# Patient Record
Sex: Male | Born: 1955 | Race: White | Hispanic: No | Marital: Married | Smoking: Current every day smoker
Health system: Southern US, Community
[De-identification: ages and names within clinical notes are randomized; demographics above are authoritative.]

## PROBLEM LIST (undated history)

## (undated) ENCOUNTER — Inpatient Hospital Stay: Admission: EM | Discharge status: Absent without leave | Payer: Self-pay | Source: Home / Self Care

## (undated) DIAGNOSIS — E785 Hyperlipidemia, unspecified: Secondary | ICD-10-CM

## (undated) DIAGNOSIS — I671 Cerebral aneurysm, nonruptured: Secondary | ICD-10-CM

## (undated) DIAGNOSIS — I509 Heart failure, unspecified: Secondary | ICD-10-CM

## (undated) DIAGNOSIS — M549 Dorsalgia, unspecified: Secondary | ICD-10-CM

## (undated) DIAGNOSIS — T4145XA Adverse effect of unspecified anesthetic, initial encounter: Secondary | ICD-10-CM

## (undated) DIAGNOSIS — K635 Polyp of colon: Secondary | ICD-10-CM

## (undated) DIAGNOSIS — K219 Gastro-esophageal reflux disease without esophagitis: Secondary | ICD-10-CM

## (undated) DIAGNOSIS — L570 Actinic keratosis: Secondary | ICD-10-CM

## (undated) DIAGNOSIS — I4892 Unspecified atrial flutter: Secondary | ICD-10-CM

## (undated) DIAGNOSIS — Z8619 Personal history of other infectious and parasitic diseases: Secondary | ICD-10-CM

## (undated) DIAGNOSIS — J45909 Unspecified asthma, uncomplicated: Secondary | ICD-10-CM

## (undated) DIAGNOSIS — J449 Chronic obstructive pulmonary disease, unspecified: Secondary | ICD-10-CM

## (undated) DIAGNOSIS — I10 Essential (primary) hypertension: Secondary | ICD-10-CM

## (undated) DIAGNOSIS — Z720 Tobacco use: Secondary | ICD-10-CM

## (undated) DIAGNOSIS — T50901A Poisoning by unspecified drugs, medicaments and biological substances, accidental (unintentional), initial encounter: Secondary | ICD-10-CM

## (undated) DIAGNOSIS — R413 Other amnesia: Secondary | ICD-10-CM

## (undated) DIAGNOSIS — J439 Emphysema, unspecified: Secondary | ICD-10-CM

## (undated) DIAGNOSIS — M1990 Unspecified osteoarthritis, unspecified site: Secondary | ICD-10-CM

## (undated) DIAGNOSIS — G629 Polyneuropathy, unspecified: Secondary | ICD-10-CM

## (undated) DIAGNOSIS — G8929 Other chronic pain: Secondary | ICD-10-CM

## (undated) DIAGNOSIS — G4733 Obstructive sleep apnea (adult) (pediatric): Secondary | ICD-10-CM

## (undated) DIAGNOSIS — I4891 Unspecified atrial fibrillation: Secondary | ICD-10-CM

## (undated) DIAGNOSIS — E669 Obesity, unspecified: Secondary | ICD-10-CM

## (undated) HISTORY — PX: CARPAL TUNNEL RELEASE: SHX101

## (undated) HISTORY — DX: Cerebral aneurysm, nonruptured: I67.1

## (undated) HISTORY — DX: Chronic obstructive pulmonary disease, unspecified: J44.9

## (undated) HISTORY — PX: KNEE SURGERY: SHX244

## (undated) HISTORY — DX: Tobacco use: Z72.0

## (undated) HISTORY — PX: OTHER SURGICAL HISTORY: SHX169

## (undated) HISTORY — PX: THROAT SURGERY: SHX803

## (undated) HISTORY — PX: NECK SURGERY: SHX720

## (undated) HISTORY — DX: Obstructive sleep apnea (adult) (pediatric): G47.33

## (undated) HISTORY — DX: Unspecified atrial flutter: I48.92

## (undated) HISTORY — PX: HERNIA REPAIR: SHX51

## (undated) HISTORY — PX: APPENDECTOMY: SHX54

## (undated) HISTORY — DX: Actinic keratosis: L57.0

## (undated) HISTORY — DX: Essential (primary) hypertension: I10

## (undated) HISTORY — PX: TONSILLECTOMY: SUR1361

## (undated) HISTORY — DX: Hyperlipidemia, unspecified: E78.5

## (undated) HISTORY — DX: Poisoning by unspecified drugs, medicaments and biological substances, accidental (unintentional), initial encounter: T50.901A

## (undated) HISTORY — DX: Obesity, unspecified: E66.9

---

## 1998-03-23 ENCOUNTER — Ambulatory Visit (HOSPITAL_COMMUNITY): Admission: RE | Admit: 1998-03-23 | Discharge: 1998-03-23 | Payer: Self-pay | Admitting: Neurosurgery

## 1998-04-22 ENCOUNTER — Encounter: Admission: RE | Admit: 1998-04-22 | Discharge: 1998-07-21 | Payer: Self-pay | Admitting: Neurosurgery

## 1998-06-29 ENCOUNTER — Ambulatory Visit (HOSPITAL_COMMUNITY): Admission: RE | Admit: 1998-06-29 | Discharge: 1998-06-29 | Payer: Self-pay | Admitting: Neurosurgery

## 2000-10-12 ENCOUNTER — Emergency Department (HOSPITAL_COMMUNITY): Admission: EM | Admit: 2000-10-12 | Discharge: 2000-10-12 | Payer: Self-pay | Admitting: Emergency Medicine

## 2000-10-12 ENCOUNTER — Encounter: Payer: Self-pay | Admitting: Emergency Medicine

## 2000-10-17 ENCOUNTER — Emergency Department (HOSPITAL_COMMUNITY): Admission: EM | Admit: 2000-10-17 | Discharge: 2000-10-17 | Payer: Self-pay | Admitting: Emergency Medicine

## 2000-12-27 ENCOUNTER — Encounter: Admission: RE | Admit: 2000-12-27 | Discharge: 2000-12-27 | Payer: Self-pay | Admitting: Neurosurgery

## 2001-04-03 ENCOUNTER — Encounter: Admission: RE | Admit: 2001-04-03 | Discharge: 2001-04-03 | Payer: Self-pay | Admitting: Neurosurgery

## 2001-11-09 ENCOUNTER — Encounter: Admission: RE | Admit: 2001-11-09 | Discharge: 2001-11-09 | Payer: Self-pay | Admitting: Internal Medicine

## 2001-11-09 ENCOUNTER — Encounter: Payer: Self-pay | Admitting: Internal Medicine

## 2001-11-10 ENCOUNTER — Encounter: Admission: RE | Admit: 2001-11-10 | Discharge: 2001-11-10 | Payer: Self-pay | Admitting: Internal Medicine

## 2001-11-10 ENCOUNTER — Encounter: Payer: Self-pay | Admitting: Internal Medicine

## 2004-02-06 ENCOUNTER — Encounter: Admission: RE | Admit: 2004-02-06 | Discharge: 2004-02-06 | Payer: Self-pay | Admitting: Neurosurgery

## 2004-08-26 ENCOUNTER — Encounter: Admission: RE | Admit: 2004-08-26 | Discharge: 2004-08-26 | Payer: Self-pay | Admitting: Specialist

## 2004-11-05 ENCOUNTER — Encounter (INDEPENDENT_AMBULATORY_CARE_PROVIDER_SITE_OTHER): Payer: Self-pay | Admitting: *Deleted

## 2004-11-05 ENCOUNTER — Ambulatory Visit (HOSPITAL_BASED_OUTPATIENT_CLINIC_OR_DEPARTMENT_OTHER): Admission: RE | Admit: 2004-11-05 | Discharge: 2004-11-05 | Payer: Self-pay | Admitting: Otolaryngology

## 2004-11-05 ENCOUNTER — Ambulatory Visit (HOSPITAL_COMMUNITY): Admission: RE | Admit: 2004-11-05 | Discharge: 2004-11-05 | Payer: Self-pay | Admitting: Otolaryngology

## 2004-12-17 ENCOUNTER — Emergency Department (HOSPITAL_COMMUNITY): Admission: EM | Admit: 2004-12-17 | Discharge: 2004-12-17 | Payer: Self-pay | Admitting: Emergency Medicine

## 2004-12-30 ENCOUNTER — Emergency Department (HOSPITAL_COMMUNITY): Admission: EM | Admit: 2004-12-30 | Discharge: 2004-12-30 | Payer: Self-pay | Admitting: Emergency Medicine

## 2006-05-16 HISTORY — PX: CARDIAC CATHETERIZATION: SHX172

## 2007-03-22 ENCOUNTER — Inpatient Hospital Stay (HOSPITAL_COMMUNITY): Admission: AD | Admit: 2007-03-22 | Discharge: 2007-03-23 | Payer: Self-pay | Admitting: Cardiology

## 2007-04-10 ENCOUNTER — Inpatient Hospital Stay (HOSPITAL_COMMUNITY): Admission: EM | Admit: 2007-04-10 | Discharge: 2007-04-11 | Payer: Self-pay | Admitting: Emergency Medicine

## 2007-05-17 DIAGNOSIS — I671 Cerebral aneurysm, nonruptured: Secondary | ICD-10-CM

## 2007-05-17 DIAGNOSIS — T50901A Poisoning by unspecified drugs, medicaments and biological substances, accidental (unintentional), initial encounter: Secondary | ICD-10-CM

## 2007-05-17 HISTORY — DX: Poisoning by unspecified drugs, medicaments and biological substances, accidental (unintentional), initial encounter: T50.901A

## 2007-05-17 HISTORY — DX: Cerebral aneurysm, nonruptured: I67.1

## 2008-02-12 ENCOUNTER — Inpatient Hospital Stay (HOSPITAL_COMMUNITY): Admission: EM | Admit: 2008-02-12 | Discharge: 2008-02-15 | Payer: Self-pay | Admitting: Emergency Medicine

## 2008-03-25 ENCOUNTER — Encounter: Admission: RE | Admit: 2008-03-25 | Discharge: 2008-03-25 | Payer: Self-pay | Admitting: Neurology

## 2008-12-25 ENCOUNTER — Inpatient Hospital Stay (HOSPITAL_COMMUNITY): Admission: EM | Admit: 2008-12-25 | Discharge: 2008-12-26 | Payer: Self-pay | Admitting: Emergency Medicine

## 2008-12-25 ENCOUNTER — Encounter (INDEPENDENT_AMBULATORY_CARE_PROVIDER_SITE_OTHER): Payer: Self-pay | Admitting: Surgery

## 2009-01-12 IMAGING — CT CT ANGIO HEAD
2 of 7 series · 14 of 37 positions shown · IV contrast ([ID] OMNI 350)
Comparison: None

CLINICAL DATA: Day.  None aneurysm.  Headache.  Confusion.
Syncope.

CT ANGIOGRAPHY HEAD
TECHNIQUE: Multidetector CT imaging of the head was performed
using the standard protocol prior to and during bolus
administration of intravenous contrast. Multiplanar CT image
reconstructions including MIPs were obtained to evaluate the
vascular anatomy.
Contrast: 100 ml Omnipaque 350

[Series 5: angio head · axial · 0.35mm/px · z∈[-55,+85]mm · 13 of 66 slices shown]
[im 5/66  brain]
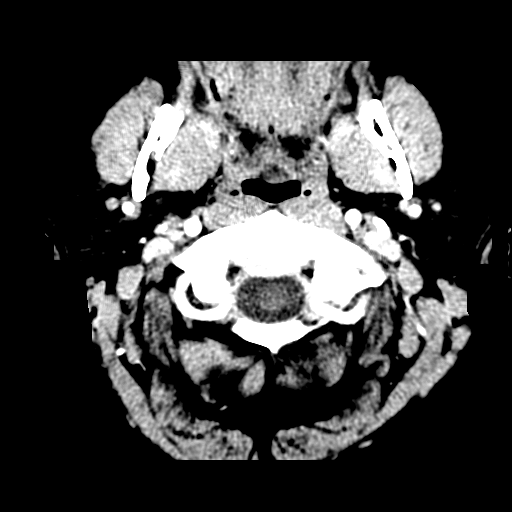
[im 10/66  bone]
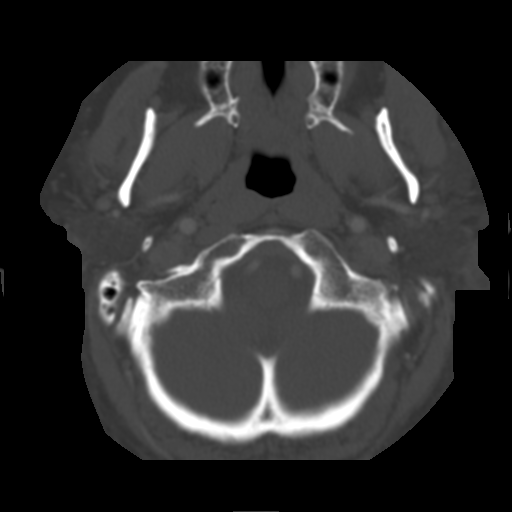
[im 14/66  brain]
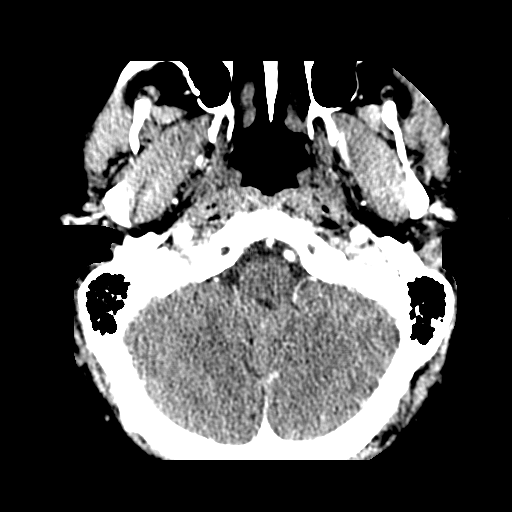
[im 19/66  bone]
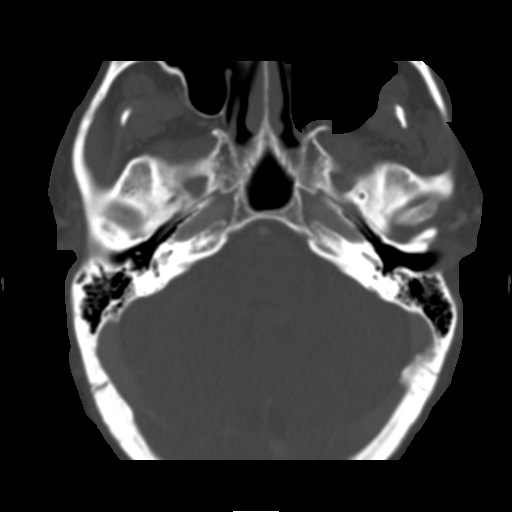
[im 24/66  brain]
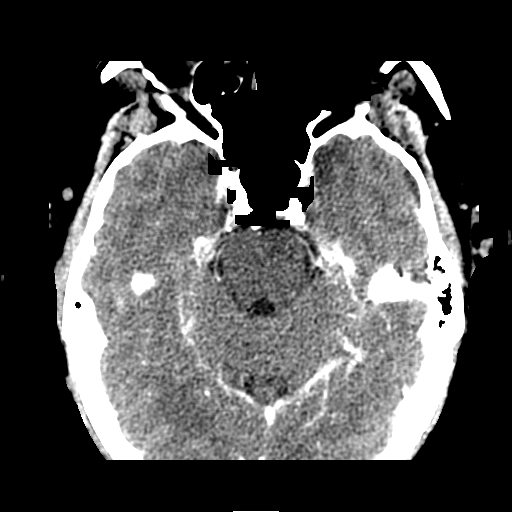
[im 28/66  bone]
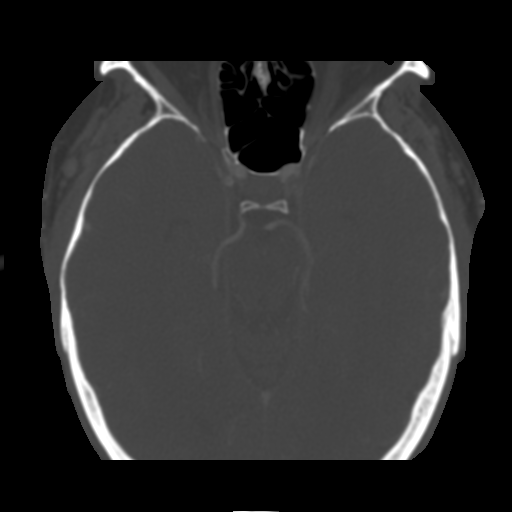
[im 33/66  brain]
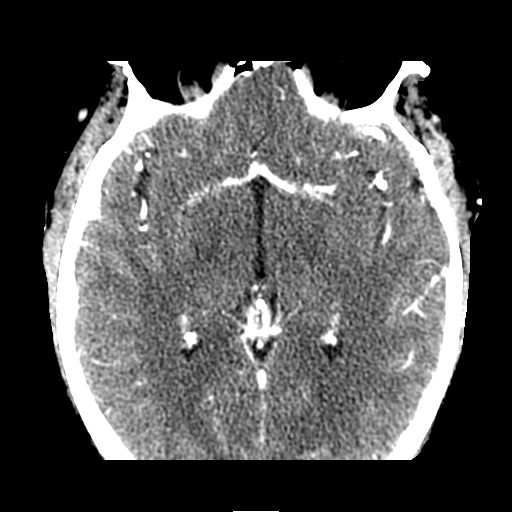
[im 38/66  bone]
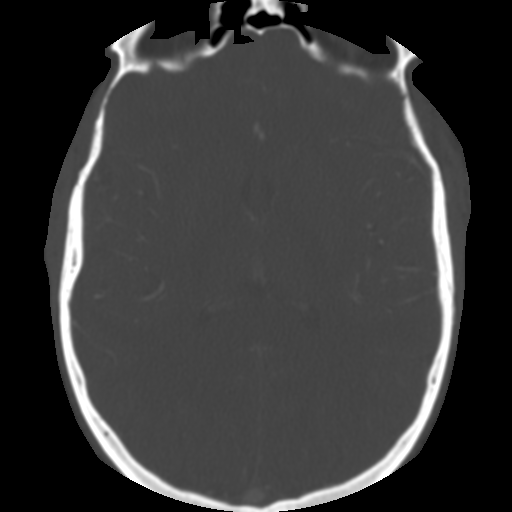
[im 42/66  brain]
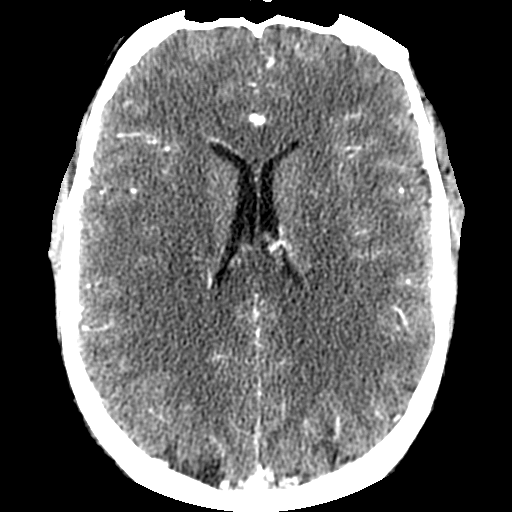
[im 47/66  bone]
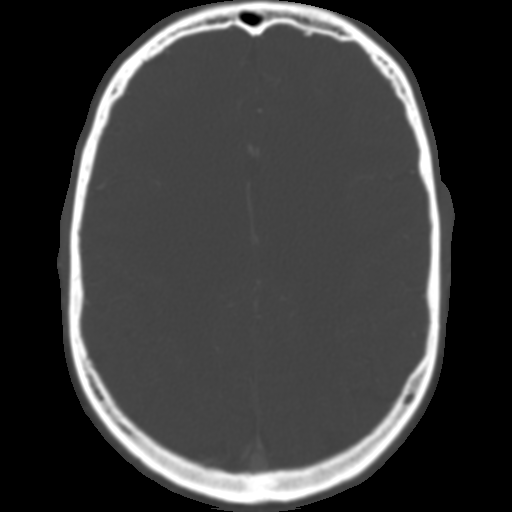
[im 52/66  brain]
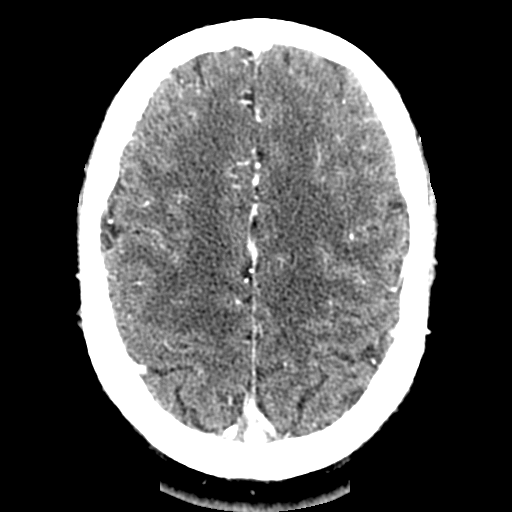
[im 56/66  bone]
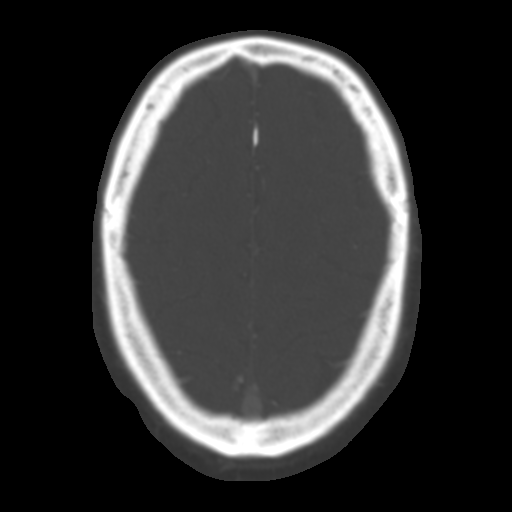
[im 61/66  brain]
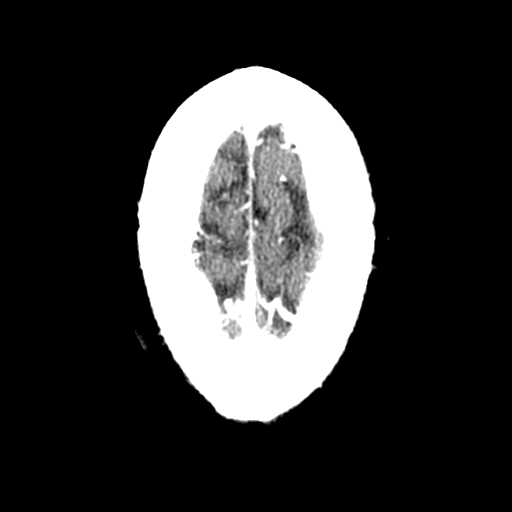

[Series 402: sag 1mm · sagittal · 0.35mm/px · 1 of 146 slices shown]
[im 73/146  brain]
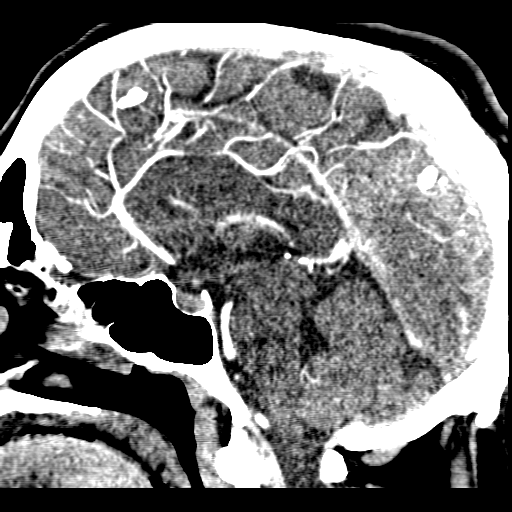

[14 of 37 positions shown; findings below may reference images not displayed]

FINDINGS: The brain has a normal appearance without evidence of
atrophy, infarction, mass lesion, hemorrhage, hydrocephalus or
extra-axial collection.  No abnormal contrast enhancement occurs.

Both internal carotid arteries are widely patent into the brain.
The anterior and middle cerebral vessels appear widely patent.  No
anterior circulation aneurysm is evident.

Both vertebral arteries are patent to the basilar.  No basilar
stenosis.  No posterior circulation aneurysm is evident.
IMPRESSION: Normal examination.  I do not see any intracranial aneurysm.  I
cannot find specific information about where that was believed to
exist.

## 2009-05-19 ENCOUNTER — Encounter: Payer: Self-pay | Admitting: Pulmonary Disease

## 2009-05-19 ENCOUNTER — Emergency Department (HOSPITAL_COMMUNITY): Admission: EM | Admit: 2009-05-19 | Discharge: 2009-05-19 | Payer: Self-pay | Admitting: Emergency Medicine

## 2009-05-27 ENCOUNTER — Ambulatory Visit: Payer: Self-pay | Admitting: Cardiovascular Disease

## 2009-06-02 ENCOUNTER — Telehealth: Payer: Self-pay | Admitting: Cardiovascular Disease

## 2009-06-18 ENCOUNTER — Ambulatory Visit: Payer: Self-pay | Admitting: Cardiovascular Disease

## 2009-08-18 ENCOUNTER — Ambulatory Visit: Payer: Self-pay | Admitting: Cardiovascular Disease

## 2009-12-22 ENCOUNTER — Ambulatory Visit: Payer: Self-pay | Admitting: Cardiovascular Disease

## 2010-01-06 ENCOUNTER — Ambulatory Visit: Payer: Self-pay | Admitting: Cardiology

## 2010-01-06 ENCOUNTER — Ambulatory Visit (HOSPITAL_COMMUNITY): Admission: RE | Admit: 2010-01-06 | Discharge: 2010-01-06 | Payer: Self-pay | Admitting: Cardiovascular Disease

## 2010-01-06 ENCOUNTER — Ambulatory Visit: Payer: Self-pay

## 2010-01-11 ENCOUNTER — Ambulatory Visit: Payer: Self-pay | Admitting: Cardiovascular Disease

## 2010-01-13 ENCOUNTER — Telehealth: Payer: Self-pay | Admitting: Cardiovascular Disease

## 2010-01-20 ENCOUNTER — Telehealth: Payer: Self-pay | Admitting: Cardiovascular Disease

## 2010-01-28 ENCOUNTER — Ambulatory Visit: Payer: Self-pay | Admitting: Pulmonary Disease

## 2010-01-28 DIAGNOSIS — G4733 Obstructive sleep apnea (adult) (pediatric): Secondary | ICD-10-CM | POA: Insufficient documentation

## 2010-01-28 DIAGNOSIS — R0989 Other specified symptoms and signs involving the circulatory and respiratory systems: Secondary | ICD-10-CM | POA: Insufficient documentation

## 2010-01-28 DIAGNOSIS — R0609 Other forms of dyspnea: Secondary | ICD-10-CM | POA: Insufficient documentation

## 2010-01-28 DIAGNOSIS — I10 Essential (primary) hypertension: Secondary | ICD-10-CM | POA: Insufficient documentation

## 2010-02-11 ENCOUNTER — Ambulatory Visit: Payer: Self-pay | Admitting: Pulmonary Disease

## 2010-02-11 DIAGNOSIS — J449 Chronic obstructive pulmonary disease, unspecified: Secondary | ICD-10-CM | POA: Insufficient documentation

## 2010-02-11 DIAGNOSIS — J441 Chronic obstructive pulmonary disease with (acute) exacerbation: Secondary | ICD-10-CM | POA: Insufficient documentation

## 2010-05-11 ENCOUNTER — Ambulatory Visit: Payer: Self-pay | Admitting: Cardiovascular Disease

## 2010-05-14 ENCOUNTER — Telehealth: Payer: Self-pay | Admitting: Cardiovascular Disease

## 2010-06-06 ENCOUNTER — Encounter: Payer: Self-pay | Admitting: Neurology

## 2010-06-17 NOTE — Progress Notes (Signed)
  Phone Note Outgoing Call   Call placed by: Dessie Coma  LPN,  January 20, 2010 10:07 AM Call placed to: Patient Summary of Call: LMVM-notified patient per Dr. Kirke Corin, lab work OK.  If any questions to call office.

## 2010-06-17 NOTE — Miscellaneous (Signed)
Summary: Orders Update pft charges  Clinical Lists Changes  Orders: Added new Service order of Carbon Monoxide diffusing w/capacity (94720) - Signed Added new Service order of Lung Volumes (94240) - Signed Added new Service order of Spirometry (Pre & Post) (94060) - Signed 

## 2010-06-17 NOTE — Assessment & Plan Note (Signed)
Summary: consult for doe   Visit Type:  Initial Consult Copy to:  Lorine Bears MD Primary Provider/Referring Provider:  Aida Puffer MD  CC:  Pulmonary Consult.  History of Present Illness: The pt is a 55y/o male who I have been asked to see for dyspnea.  He has had sob for years, but feels that it has worsened the past one year.  His walking is limited by significant leg pain, but walks long distances with his wife.  He will get winded bringing groceries in from the car, and states that his symptoms are worse when he has to carry weight of some type in his arms.  He remarks that his sob is primarily with heavier exertional activities.  He has a mild cough that is productive of white foamy mucus, but denies any significant LE edema.  He has a h/o smoking 3ppd for years, but recently cut back to one ppd.  He has had no recent pfts, but did have a cxr in Jan of this year which showed no acute process.  He did have a recent echo last month which showed nl systolic function, LVH, moderately dilated LA, normal rv, but PA pressures estimated at 43-25mm.  His recent labwork showed no thyroid dysfunction or anemia.    Preventive Screening-Counseling & Management  Alcohol-Tobacco     Smoking Status: current     Smoking Cessation Counseling: yes     Packs/Day: 1.0     Tobacco Counseling: to quit use of tobacco products  Current Medications (verified): 1)  Flecainide Acetate 100 Mg Tabs (Flecainide Acetate) .... Take One Tablet By Mouth Twice Daily 2)  Lisinopril-Hydrochlorothiazide 20-25 Mg Tabs (Lisinopril-Hydrochlorothiazide) .... Take One Tablet By Mouth Once Daily 3)  Propranolol Hcl 80 Mg Tabs (Propranolol Hcl) .... Once Daily 4)  Fish Oil 1000 Mg Caps (Omega-3 Fatty Acids) .... Once Daily 5)  Simvastatin 20 Mg Tabs (Simvastatin) .... Once Daily 6)  Omeprazole 20 Mg Cpdr (Omeprazole) .... Once Daily 7)  Multivitamins  Tabs (Multiple Vitamin) .... Once Daily 8)  Furosemide 40 Mg Tabs  (Furosemide) .... Once Daily/prn 9)  Aspirin 325 Mg Tabs (Aspirin) .... Once Daily 10)  Viagra 100 Mg Tabs (Sildenafil Citrate) .... As Needed  Allergies (verified): No Known Drug Allergies  Past History:  Past Medical History: Hx of ATRIAL FIBRILLATION (ICD-427.31) SLEEP APNEA (ICD-780.57) HYPERTENSION (ICD-401.9)  Past Surgical History: Appendectomy Neck surgery.Marland KitchenMarland Kitchen?cervical spine multiple orthopedic surgeries hernia repair surgery  Family History: Reviewed history and no changes required. Atrial fibrillation--father Allergies--mother cancer--aunt  Social History: Reviewed history and no changes required. Married lives with wife Patient is a current smoker.  1ppd. 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  The patient denies productive cough, non-productive cough, coughing up blood, chest pain, indigestion, loss of appetite, weight change, difficulty swallowing, sore throat, tooth/dental problems, headaches, nasal congestion/difficulty breathing through nose, sneezing, itching, ear ache, anxiety, depression, rash, change in color of mucus, and fever.    Vital Signs:  Patient profile:   55 year old male Height:      72 inches Weight:      256.25 pounds BMI:     34.88 O2 Sat:      96 % on Room air Temp:  98.4 degrees F oral Pulse rate:   71 / minute BP sitting:   130 / 72  (right arm) Cuff size:   large  Vitals Entered By: Carver Fila (January 28, 2010 9:47 AM)  O2 Flow:  Room air CC: Pulmonary Consult Comments Meds and allergies updated Phone number updated Carver Fila  January 28, 2010 9:52 AM    Physical Exam  General:  ow male in nad Eyes:  PERRLA and EOMI.   Nose:  obstructed  on right, narrowed on left Mouth:  mild elongation of soft and palate Neck:  no jvd, tmg, LN Lungs:  rhonchi and a few wheezes throughout. Heart:  rrr, no mrg Abdomen:  soft and nontender, bs+ Extremities:  no edema or cyanosis  pulses intact distally Neurologic:  alert and oriented, moves all 4.   Impression & Recommendations:  Problem # 1:  DYSPNEA ON EXERTION (ICD-786.09) the pt has progressive doe that I suspect is multifactorial.  He clearly has significant debility from his leg issues and conditioning, but I suspect he also has underlying obstructive lung disease from his longstanding smoking.  It is unclear whether his cardiac issues are contributing to all of this.  At this point, he will need a more recent cxr and full pfts for w/u.  I will see him back to discuss.  He is wheezing currently, and will start him on an as needed beta agonist until his pfts are done.  I have urged him to work on smoking cessation as well.  If he is found to have obstructive lung disease, he will need to come off propranolol, and switch to a more cardioselective drug.  If cough continues to be an issue for him, would consider a trial off ACE.  Medications Added to Medication List This Visit: 1)  Propranolol Hcl 80 Mg Tabs (Propranolol hcl) .... Once daily 2)  Fish Oil 1000 Mg Caps (Omega-3 fatty acids) .... Once daily 3)  Simvastatin 20 Mg Tabs (Simvastatin) .... Once daily 4)  Omeprazole 20 Mg Cpdr (Omeprazole) .... Once daily 5)  Multivitamins Tabs (Multiple vitamin) .... Once daily 6)  Furosemide 40 Mg Tabs (Furosemide) .... Once daily/prn 7)  Aspirin 325 Mg Tabs (Aspirin) .... Once daily 8)  Viagra 100 Mg Tabs (Sildenafil citrate) .... As needed  Other Orders: Consultation Level IV (11914) Pulmonary Referral (Pulmonary) T-2 View CXR (71020TC) Tobacco use cessation intermediate 3-10 minutes (78295)  Patient Instructions: 1)  will check cxr today, and call you with results. 2)  will schedule  for breathing studies, and see you back on same day. 3)  stop smoking 4)  can use ventolin 2 puffs every 6 hrs if needed if having breathing problems.  Do not use on the am of your breathing tests.   Immunization History:  Influenza Immunization History:    Influenza:  historical (02/13/2009)  Pneumovax Immunization History:    Pneumovax:  historical (02/14/2007)

## 2010-06-17 NOTE — Progress Notes (Signed)
  Phone Note Outgoing Call   Call placed by: Dessie Coma LPN Call placed to: Patient Summary of Call: Patient called per Dr. Freida Busman to discuss 48 hour holter monitor...patient had told Windell Moulding at [employer] that he did not want to drive to [place] to have monitor put on and he could not afford the $400 for the event monitor to be mailed to him.Marland KitchenMarland KitchenPatient is now agreeable with driving to [facility] to have monitor put on and explained to him that he would have to return to [place] after 2 days of wearing monitor to have it taken off.Marland KitchenMarland KitchenPatient voiced understanding and asked for Windell Moulding to call him when a monitor was available.Marland KitchenMarland KitchenAlso advised patient that [facility] needed him to call and set up a payment plan with them prior to making an appointment.Marland KitchenMarland KitchenPatient given their ph# and he voiced understanding.

## 2010-06-17 NOTE — Progress Notes (Signed)
Summary: change of meds.  Phone Note Call from Patient   Caller: Patient Summary of Call: T.C. from patient-since changing from Propranolol to Metoprolol on Tues. 05/11/10, heart is "acting up" more and his feet are hurting more.  Will notify Dr. Kirke Corin. Initial call taken by: Dessie Coma  LPN,  May 14, 2010 9:17 AM  Follow-up for Phone Call        T.C. from patient-lower legs and feet are hurting esp. when lying down. Follow-up by: Dessie Coma  LPN,  May 18, 2010 9:48 AM  Additional Follow-up for Phone Call Additional follow up Details #1::        Patient notified per Dr. Kirke Corin, switch back to Propranolol but his sxs may not be related to the medicine. Additional Follow-up by: Dessie Coma  LPN,  May 18, 2010 4:54 PM

## 2010-06-17 NOTE — Assessment & Plan Note (Signed)
Summary: rov for review of pfts for w/u of doe   Copy to:  Lorine Bears MD Primary Provider/Referring Provider:  Aida Puffer MD  CC:  pt here to discuss sleep study results. pt states his breathing is the same from last visit. pt states he can't really do much without having a hard time to breathe. Ptc/o of productive cough with white phlem. Pt states yesterday evening when he got out of the car he feel to the ground and he wasn't himself x 3 hrs.  Pt states he is still smoking 1/2 ppd. .  History of Present Illness: The pt comes in today for review of his pfts as part of a w/u for doe.  He was found to have mild airflow obstruction, no restriction, and his DLCO was normal.  I have reviewed the results with him in detail, and answered all of his questions.  Unfortunately, he is still smoking.  Preventive Screening-Counseling & Management  Alcohol-Tobacco     Smoking Status: current     Smoking Cessation Counseling: yes     Packs/Day: 1.0     Tobacco Counseling: to quit use of tobacco products  Current Medications (verified): 1)  Flecainide Acetate 100 Mg Tabs (Flecainide Acetate) .... Take One Tablet By Mouth Twice Daily 2)  Lisinopril-Hydrochlorothiazide 20-25 Mg Tabs (Lisinopril-Hydrochlorothiazide) .... Take One Tablet By Mouth Once Daily 3)  Propranolol Hcl 80 Mg Tabs (Propranolol Hcl) .... Once Daily 4)  Fish Oil 1000 Mg Caps (Omega-3 Fatty Acids) .... Once Daily 5)  Simvastatin 20 Mg Tabs (Simvastatin) .... Once Daily 6)  Omeprazole 20 Mg Cpdr (Omeprazole) .... Once Daily 7)  Multivitamins  Tabs (Multiple Vitamin) .... Once Daily 8)  Furosemide 40 Mg Tabs (Furosemide) .... Once Daily/prn 9)  Aspirin 325 Mg Tabs (Aspirin) .... Once Daily 10)  Viagra 100 Mg Tabs (Sildenafil Citrate) .... As Needed 11)  Ventolin Hfa 108 (90 Base) Mcg/act Aers (Albuterol Sulfate) .... 2 Puffs Every 6 Hrs As Needed  Allergies (verified): No Known Drug Allergies  Review of Systems       The  patient complains of shortness of breath with activity, shortness of breath at rest, productive cough, nasal congestion/difficulty breathing through nose, hand/feet swelling, and joint stiffness or pain.  The patient denies non-productive cough, coughing up blood, chest pain, irregular heartbeats, acid heartburn, indigestion, loss of appetite, weight change, abdominal pain, difficulty swallowing, sore throat, tooth/dental problems, headaches, sneezing, itching, ear ache, anxiety, depression, rash, change in color of mucus, and fever.    Vital Signs:  Patient profile:   55 year old male Height:      72 inches Weight:      255 pounds BMI:     34.71 O2 Sat:      94 % on Room air Temp:     98.4 degrees F oral Pulse rate:   72 / minute BP sitting:   118 / 72  (left arm) Cuff size:   large  Vitals Entered By: Carver Fila (February 11, 2010 11:59 AM)  O2 Flow:  Room air CC: pt here to discuss sleep study results. pt states his breathing is the same from last visit. pt states he can't really do much without having a hard time to breathe. Ptc/o of productive cough with white phlem. Pt states yesterday evening when he got out of the car he feel to the ground and he wasn't himself x 3 hrs.  Pt states he is still smoking 1/2 ppd.  Comments meds  and allergies updated Phone number updated Carver Fila  February 11, 2010 11:59 AM    Physical Exam  General:  ow male in nad Lungs:  much clearer from last visit.  No wheezing or rhonchi, adequate airflow Heart:  rrr Extremities:  mild edema, no cyanosis  Neurologic:  alert and oriented, moves all 4.   Impression & Recommendations:  Problem # 1:  EMPHYSEMA (ICD-492.8)  the pt has mild airflow obstruction on his recent pfts that may be due to emphysema, or acute/chronic airway inflammation associated with his ongoing smoking.  Either way, he would benefit from a bronchodilator regimen and smoking cessation.  I have told him the latter is THE most  important.  If he does not see a big difference in his breathing with the medication, would consider whether his estimated pulmonary htn on echo could be the issue.  He may need a right heart cath for further w/u.    Medications Added to Medication List This Visit: 1)  Ventolin Hfa 108 (90 Base) Mcg/act Aers (Albuterol sulfate) .... 2 puffs every 6 hrs as needed  Other Orders: Est. Patient Level III (24401)  Patient Instructions: 1)  stop smoking. 2)  will start on symbicort 160/4.5  2 inhalations am and pm...rinse mouth well 3)  can use albuterol as needed for rescue/emergencies 4)  followup with me in 4 weeks.

## 2010-06-17 NOTE — Progress Notes (Signed)
  Phone Note Outgoing Call   Call placed by: Dessie Coma  LPN,  January 13, 2010 9:40 AM Call placed to: Patient Summary of Call: LMTC-appt scheduled with [facility] for Thurs. 01/28/10 at 9:45am for evauluate for dyspnea and COPD.

## 2010-08-01 LAB — COMPREHENSIVE METABOLIC PANEL
AST: 24 U/L (ref 0–37)
Albumin: 3.8 g/dL (ref 3.5–5.2)
Chloride: 105 mEq/L (ref 96–112)
Creatinine, Ser: 0.7 mg/dL (ref 0.4–1.5)
GFR calc non Af Amer: >60 mL/min (ref >60)
Potassium: 4.1 mEq/L (ref 3.5–5.1)
Sodium: 141 mEq/L (ref 135–145)
Total Protein: 6.3 g/dL (ref 6.0–8.3)

## 2010-08-01 LAB — DIFFERENTIAL
Basophils Relative: 0 % (ref 0–1)
Eosinophils Absolute: 0.2 10*3/uL (ref 0.0–0.7)
Eosinophils Relative: 2 % (ref 0–5)
Lymphs Abs: 1.8 10*3/uL (ref 0.7–4.0)
Monocytes Absolute: 0.6 10*3/uL (ref 0.1–1.0)
Monocytes Relative: 9 % (ref 3–12)

## 2010-08-01 LAB — POCT CARDIAC MARKERS
CKMB, poc: <1 ng/mL — ABNORMAL LOW (ref 1.0–8.0)
Myoglobin, poc: 52.7 ng/mL (ref 12–200)
Myoglobin, poc: 52.9 ng/mL (ref 12–200)
Troponin i, poc: <0.05 ng/mL (ref 0.00–0.09)

## 2010-08-01 LAB — CBC
Hemoglobin: 15.1 g/dL (ref 13.0–17.0)
MCHC: 34.8 g/dL (ref 30.0–36.0)
MCV: 92 fL (ref 78.0–100.0)
Platelets: 157 10*3/uL (ref 150–400)
RDW: 13.3 % (ref 11.5–15.5)

## 2010-08-21 LAB — BASIC METABOLIC PANEL
BUN: 8 mg/dL (ref 6–23)
Calcium: 8.8 mg/dL (ref 8.4–10.5)
Creatinine, Ser: 0.76 mg/dL (ref 0.4–1.5)
Glucose, Bld: 114 mg/dL — ABNORMAL HIGH (ref 70–99)
Potassium: 4 mEq/L (ref 3.5–5.1)
Sodium: 138 mEq/L (ref 135–145)

## 2010-08-21 LAB — DIFFERENTIAL
Basophils Relative: 1 % (ref 0–1)
Eosinophils Absolute: 0.1 10*3/uL (ref 0.0–0.7)
Lymphs Abs: 1.7 10*3/uL (ref 0.7–4.0)
Monocytes Absolute: 0.9 10*3/uL (ref 0.1–1.0)
Monocytes Relative: 9 % (ref 3–12)

## 2010-08-21 LAB — CBC
HCT: 43.3 % (ref 39.0–52.0)
Hemoglobin: 14.9 g/dL (ref 13.0–17.0)
MCHC: 34.3 g/dL (ref 30.0–36.0)
MCHC: 34.4 g/dL (ref 30.0–36.0)
MCV: 93 fL (ref 78.0–100.0)
Platelets: 170 10*3/uL (ref 150–400)
RBC: 4.4 MIL/uL (ref 4.22–5.81)
RBC: 4.65 MIL/uL (ref 4.22–5.81)
RDW: 13.7 % (ref 11.5–15.5)
RDW: 14.2 % (ref 11.5–15.5)
WBC: 8.6 10*3/uL (ref 4.0–10.5)

## 2010-08-21 LAB — URINALYSIS, ROUTINE W REFLEX MICROSCOPIC
Ketones, ur: NEGATIVE mg/dL
Specific Gravity, Urine: 1.024 (ref 1.005–1.030)
Urobilinogen, UA: 0.2 mg/dL (ref 0.0–1.0)

## 2010-08-21 LAB — COMPREHENSIVE METABOLIC PANEL
AST: 19 U/L (ref 0–37)
Albumin: 3.8 g/dL (ref 3.5–5.2)
BUN: 14 mg/dL (ref 6–23)
Calcium: 9.5 mg/dL (ref 8.4–10.5)
Creatinine, Ser: 0.7 mg/dL (ref 0.4–1.5)
GFR calc Af Amer: >60 mL/min (ref >60)
GFR calc non Af Amer: >60 mL/min (ref >60)
Total Bilirubin: 0.9 mg/dL (ref 0.3–1.2)

## 2010-09-28 NOTE — Op Note (Signed)
[name], [name]               ACCOUNT NO.:  [id number]   MEDICAL RECORD NO.:  [id number]          PATIENT TYPE:  INP   LOCATION:  5121                         FACILITY:  [facility]   PHYSICIAN:  Wilmon Arms. Corliss Skains, M.D. DATE OF BIRTH:  1956/02/05   DATE OF PROCEDURE:  12/25/2008  DATE OF DISCHARGE:                               OPERATIVE REPORT   PREOPERATIVE DIAGNOSIS:  Acute appendicitis.   POSTOPERATIVE DIAGNOSIS:  Acute appendicitis.   PROCEDURE PERFORMED:  Laparoscopic converted to open appendectomy.   SURGEON:  Wilmon Arms. Corliss Skains, MD   ASSISTANT:  Juanetta Gosling, MD   ANESTHESIA:  General endotracheal.   INDICATIONS:  This is a 55 year old male with a past medical history  significant for some pulmonary disease who presents with a 1-day history  of abdominal pain localized to his right lower quadrant.  He was  evaluated in the emergency department and was found to have acute  appendicitis.  On the CT scan, it appeared that he had a large dilated  appendix in a retrocecal location, but no sign of obvious perforation.  He presents now for urgent appendectomy.   DESCRIPTION OF PROCEDURE:  The patient was brought to the operating room  and placed in supine position on the operating room table.  After an  adequate level of general anesthesia was obtained, a Foley catheter was  placed in a sterile technique.  The patient's abdomen was prepped with  Betadine and draped in sterile fashion.  A time-out was taken to ensure  the proper patient and proper procedure.  The area below his umbilicus  was infiltrated with 0.25% Marcaine with epinephrine.  A transverse  incision was made.  Dissection was carried down to the fascia.  The  fascia was grasped with Kocher clamps and was opened vertically.  We  entered the peritoneal cavity bluntly.  A stay suture of 0-Vicryl was  placed around the fascial opening.  The Hasson cannula was inserted and  secured to stay suture.   Pneumoperitoneum was obtained by insufflating  CO2 maintaining a maximum pressure of 15 mmHg.  A 5-mm port was placed  in the right upper quadrant and another 5-mm port was placed in the left  lower quadrant.  The cecum was mobilized medially.  The terminal ileum  was easily identified.  There was no gross purulence in the right lower  quadrant.  We divided some lateral attachments and mobilized the cecum  medially.  There was no obvious sign of the appendix.  It was felt that  this was in a retrocecal location.  We tried to mobilize the cecum  medially to expose this area.  However, the adhesions were too thick to  allow safe dissection.  Therefore, the decision was made to convert to  an open appendectomy.  We made a transverse incision directly over the  cecum.  The laparoscopic ports were removed and the umbilical fascia was  closed with a purse-string suture.  We opened the fascia in the right  lower quadrant and exposed the cecum.  Blunt dissection revealed a very  large inflamed retrocecal  appendix.  This was dissected free using blunt  finger dissection.  The mesoappendix was taken with the harmonic  scalpel.  The tip of the appendix did not appear to be perforated but  was very swollen and inflamed.  The base of the appendix was normal.  We  divided the base of the appendix at the cecum with an Endo-GIA stapler  which had previously been opened.  The staple line was carefully  inspected and was intact with no sign of bleeding or leak.  We irrigated  the right lower quadrant.  The appendix was sent for pathologic  examination.  The fascia was closed with #1 PDS  suture.  The subcutaneous tissues were irrigated.  Staples were used to  close all skin incisions.  Clean dressings were applied.  The patient  was then extubated and brought to recovery room in stable condition.  All sponge, instrument, and needle counts were correct.      Wilmon Arms. Tsuei, M.D.  Electronically  Signed     MKT/MEDQ  D:  12/25/2008  T:  12/26/2008  Job:  [id number]

## 2010-09-28 NOTE — Consult Note (Signed)
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  He  has been followed by Dr. Jacinto Halim.  He had a catheterization in November of  2008 that showed normal coronaries.  He has normal LV function by  echocardiogram.  He has a history of PAF and has been on flecainide.  Dr. Jacinto Halim had actually sent him to Dr. Earney Navy at [facility] for  consideration of atrial fibrillation ablation, but apparently [name]  had had elevated LFTs and this was cancelled.  The plan was to reassess  his LFTs and reevaluate him for potential atrial fibrillation ablation,  but the patient at this time does not want to pursue this.  He has been  in sinus rhythm on flecainide.  He says he has occasional breakthrough  atrial fibrillation that may last 10 minutes or less.  He is not on  Coumadin.  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  He was admitted December 25, 2008 with acute  appendicitis.  We are asked to see him in consult perioperatively.  Apparently [name] had been set up to have an outpatient Myoview  study, he does have some chronic dyspnea on exertion and some vague  chest pain.  He did have normal coronaries in November 2008.  He is now  seen postoperatively and is awake and alert and denies any chest pain or  unusual shortness of breath.  His EKG shows sinus rhythm.   CURRENT MEDICATIONS:  Are not clear at this time.  The last time he saw  Dr. Jacinto Halim he was on:   1. Flecainide 50 mg b.i.d.  2. Micardis 80/12.5.  3.  Zocor 20.  4. Lovasa 2 grams b.i.d.  5. Protonix 40 mg a day.  6. Tricor 48 mg a day.  7. Multivitamin.  8. Cardizem ER 180.  9. Aspirin 325.   ALLERGIES:  He has no known drug allergies.   SOCIAL HISTORY:  He is married.  He has two children and one grandchild.  He smokes a pack a day.  He says he drinks occasionally.   FAMILY HISTORY:  Remarkable that his father is 18 and has a history of  atrial fibrillation.   PAST MEDICAL HISTORY:  Otherwise remarkable for prior right shoulder  surgery, prior right knee surgery and prior tonsillectomy and remote  left inguinal hernia repair.  He did have an accidental flecainide  overdose in September 2009 and was hospitalized for that.  He he was on  Coumadin at one point for PAF, but this was stopped in 2009.  REVIEW OF SYSTEMS:  Essentially unremarkable except for noted above.   PHYSICAL EXAM:  VITAL SIGNS:  Blood pressure 104/67, pulse 74,  temperature 98.5, O2 sats 94 on 2 liters.  IN GENERAL:  He is a well-developed, well-nourished male in no acute  distress.  HEENT:  Normocephalic, atraumatic.  Extraocular movements are intact.  Sclera is anicteric.  Lids and conjunctivae are within normal limits.  NECK:  Without JVD or bruit.  CHEST:  Reveals diminished breath sounds and some faint expiratory  wheezing bilaterally.  CARDIAC:  Reveals regular rate and rhythm today without murmur, rub or  gallop.  ABDOMEN:  Tender.  Surgical dressing is intact and dry in the right  lower quadrant.  EXTREMITIES:  Without significant edema.  NEURO:  Exam is grossly intact.  He is awake, alert, oriented and  cooperative.  Moves all extremities without obvious deficit.   LABS:  Sodium 138, potassium 4.0, BUN 8, creatinine 0.76.  White count  8.6, hemoglobin 14.2, hematocrit 41.2, platelets 159.  Liver functions  on admission are normal with an SGOT of 19, SGPT of 29.  Urinalysis is  unremarkable.  EKG shows sinus rhythm with a QTC of 420 and a  heart rate  of 67.   IMPRESSION:  1. History of paroxysmal atrial fibrillation, holding sinus rhythm on      flecainide.  2. Normal coronaries at catheterization, November 2008.  The patient      was to have an elective outpatient stress Myoview, but we will put      this off for now.  3. Chronic obstructive pulmonary disease and smoking.  4. History of alcohol use.  5. Status post appendectomy for acute appendicitis  6. Treated hypertension.   PLAN:  The patient was seen by Dr. Mariah Milling and myself today in the  hospital.  We will resume his home medications.  He will follow up with  Dr. Mariah Milling as an outpatient.      Abelino Derrick, P.A.      Antonieta Iba, MD  Electronically Signed    LKK/MEDQ  D:  12/26/2008  T:  12/26/2008  Job:  [id number]

## 2010-09-28 NOTE — Assessment & Plan Note (Signed)
[facility]                         CARDIOLOGY OFFICE NOTE   NAME:[name], [name]                      MRN:          [id number]  DATE:06/18/2009                            DOB:          12-28-1955    PROBLEM LIST:  1. History of paroxysmal atrial fibrillation.  2. Hypertension.  3. Hyperlipidemia.  4. Tobacco use.   INTERVAL HISTORY:  The patient states since his last office visit, he  has been getting along fairly well.  He continues to get occasional  chest discomfort that is not lifestyle limiting.  He states that he gets  palpitations approximately 12 times a week.  The worse ones are at night  and can last for several minutes at a time during which he feels like  his heart is out of rhythm.  He has made efforts to be compliant with  his medications.   PHYSICAL EXAMINATION:  VITAL SIGNS:  Today, his blood pressure is  138/76, pulse 65.  He is sating 98% on room air and he weighs 253  pounds.  GENERAL:  No acute distress.  HEENT:  Normocephalic, atraumatic.  NECK:  Supple.  HEART:  Regular rate and rhythm without murmur, rub, or gallop.  LUNGS:  Clear bilaterally.  ABDOMEN:  Soft, nontender, nondistended.  EXTREMITIES:  Without edema.  SKIN:  Warm and dry.   REVIEW OF STRESS TEST:  The patient exercised for 6 minutes on the Bruce  protocol and there is no EKG or echocardiographic evidence of ischemia,  although the patient's heart rate was 71% of maximum predicted.   ASSESSMENT AND PLAN:  1. Paroxysmal atrial fibrillation.  The patient was unable to obtain      the Holter monitor ordered at the last office visit secondary to      financial constraints.  His EKG at the last office visit was normal      sinus rhythm and his heart again is regular on exam today.  It is      unclear whether the palpitations, he is experiencing are from      atrial fibrillation.  Today in clinic, we will have him increase      his flecainide to 100  mg b.i.d. to see if this helps quiet down his      arrhythmia.  We will also have him hold the diltiazem as this may      be increase the concentrations of flecainide and the patient had a      issue with flecainide overdose in the remote past.  He will call      the office in a week's time with recent blood pressure measurements      and his lisinopril and hydrochlorothiazide can be titrated up at      that time.  The patient should continue on aspirin 325 daily.  Of      note, the patient had previously been taken off Coumadin for      question of a brain aneurysm which he says has since been      disproven.  However, the Coumadin was never restarted.  For now, we      will continue him on aspirin 325 daily.  His main risk factor for      thrombotic event appears to be hypertension.  2. Hypertension as above.  We will increase lisinopril and      hydrochlorothiazide as needed with discontinuation of the      diltiazem.  3. Hyperlipidemia.  The patient should continue on simvastatin 20 mg      daily, when we will check a fasting lipid profile in several      months' time prior to his next office visit.  4. Tobacco use-  He plans on making efforts to decrease and eventually      quit tobacco use.      Brayton El, MD  Electronically Signed    SGA/MedQ  DD: 06/18/2009  DT: 06/19/2009  Job #: [id number]

## 2010-09-28 NOTE — Letter (Signed)
May 27, 2009    Dr. Aida Puffer  [address], [place]  [address]   RE:  [name], [name]  MRN:  [id number]  /  DOB:  May 29, 1955   Dear Dr. Clarene Duke:   I had the pleasure of seeing your patient, [name], in cardiology  clinic today.  As you know he is a 55 year old gentleman with a history  of paroxysmal atrial fibrillation who has been having increasing chest  discomfort over the past week and palpitations.  I believe you saw him  approximately 1 week ago and he was in atrial flutter in your office.  He subsequently reported to [facility] but converted to normal  sinus rhythm and was discharged home from the emergency room.  I will  proceed with an exercise echocardiogram to rule out inducible ischemia  as well as placing the patient on a 48 hour Holter monitor.   Thank you for the referral of this patient and please contact my office  if I can be of further assistance.    Sincerely,      Brayton El, MD  Electronically Signed    SGA/MedQ  DD: 05/27/2009  DT: 05/27/2009  Job #: [id number]

## 2010-09-28 NOTE — Assessment & Plan Note (Signed)
[facility]                        [place] CARDIOLOGY OFFICE NOTE   NAME:[name], [name]                      MRN:          [id number]  DATE:08/18/2009                            DOB:          27-Oct-1955    PROBLEM LIST:  1. History of paroxysmal atrial fibrillation.  2. Hypertension.  3. Hyperlipidemia.  4. Tobacco use.   INTERVAL HISTORY:  The patient states since his last visit, he is doing  fairly well.  He states that since he stopped diltiazem, he feels as if  his upper and lower extremities have been colder.  His palpitations have  continued at the same frequency.  Unfortunately in the past, he has not  been able to afford a cardiac event monitor.  He also endorses bilateral  leg discomfort at night that is relieved with getting up and walking  around.  He also endorses some chronic back pain and occasional moderate-  to-severe weakness in his bilateral lower extremities.   PHYSICAL EXAMINATION:  VITAL SIGNS:  Today, his blood pressure is  143/75, pulse is 68, saturating 95% on room air, and he weighs 253  pounds.  GENERAL:  He is in no acute distress.  HEENT:  Normocephalic, atraumatic.  NECK:  Supple.  There is no carotid bruits.  HEART:  Regular rate and rhythm without murmur, rub, or gallop.  LUNGS:  Clear bilaterally.  ABDOMEN:  Soft, nontender, nondistended.  EXTREMITIES:  Without edema.  SKIN:  Warm and dry.   ASSESSMENT AND PLAN:  1. Paroxysmal atrial fibrillation.  The patient continues to have some      palpitations, but it is unclear whether these represent atrial      fibrillation.  He was taken off Coumadin in the past secondary to      some type of brain aneurysm which he says this has been disproven.      His main risk factor for a thrombotic event appears to be his      hypertension.  He should continue on aspirin for now.  Also we will      decrease flecainide 50 mg b.i.d. as the higher dose has not made      any  difference in his symptoms.  We will restart diltiazem at 180      mg daily and he believes that this helped prevent the cold      sensation in his extremities.  2. Hypertension.  Restarting diltiazem will help address this.  3. Tobacco use.  The patient states he desires to stop smoking      tobacco.  He will speak with Dr. Clarene Duke at the next office visit      regarding Chantix as he and his wife are both interested in      beginning this medication.  4. Bilateral leg pain at night.  The symptoms seem consistent with      restless legs syndrome and he will follow up with Dr. Clarene Duke within      a month to talk to him about possible treatment for restless legs      syndrome.  5.  Hyperlipidemia.  He is currently on simvastatin.  He will have a      fasting lipid profile checked at his next primary care visit and we      will review it at that time.     Brayton El, MD  Electronically Signed    SGA/MedQ  DD: 08/18/2009  DT: 08/19/2009  Job #: [id number]   cc:   Aida Puffer, MD

## 2010-09-28 NOTE — Discharge Summary (Signed)
[name], [name]               ACCOUNT NO.:  [id number]   MEDICAL RECORD NO.:  [id number]          PATIENT TYPE:  INP   LOCATION:  3741                         FACILITY:  [facility]   PHYSICIAN:  Cristy Hilts. Jacinto Halim, MD       DATE OF BIRTH:  08/14/1955   DATE OF ADMISSION:  04/10/2007  DATE OF DISCHARGE:  04/11/2007                               DISCHARGE SUMMARY   DISCHARGE DIAGNOSES:  1. Paroxysmal atrial fibrillation, sinus rhythm at discharge.  2. Treated hypertension.  3. History of smoking.  4. History of ETOH.  5. Coumadin therapy, new this admission.   HOSPITAL COURSE:  The patient is a 55 year old male with PAF  hypertension, dyslipidemia and smoking.  He has a prior history of heavy  alcohol use.  He was admitted earlier this month with atrial  fibrillation and converted spontaneously to sinus rhythm.  Catheterization then revealed normal coronaries, normal LV function.  He  had stopped drinking at that point.  Over the last couple of days, he  had had a couple of drinks and presented on April 10, 2007 with  palpitations and shortness of breath.  He was found to be in rapid  atrial fibrillation.  He was treated with IV Cardizem and converted  spontaneously again to sinus rhythm early on.  He was started on IV  heparin.  He did have some significant wheezing, and we cut back on his  beta-blocker, he was previously on 100 mg of Lopressor twice a day.  We  added Diltiazem.  He was treated with handheld nebulizers.  He continued  to hold sinus rhythm, and Dr. Tresa Endo feels he can be discharged on  April 11, 2007.  We did start him on Coumadin.  He received one dose  of Lovenox, but will not go on Lovenox crossover since he was briefly in  atrial fibrillation at this time.  We will see him back in the office in  a couple weeks in follow up.   DISCHARGE MEDICATIONS:  1. Micardis HCTZ 80/12.5 once a day.  2. Lovaza 2 tablets b.i.d.  3. Metoprolol 25 mg twice a day.  4.  Diltiazem CD 180 daily.  5. Protonix 40 mg a day.  6. Simvastatin 20 mg a day.  7. Tricor 48 mg a day.  8. Coumadin 5 mg a day.  9. He will have a pro time on Monday.   LABORATORY DATA:  White count 5.0, hemoglobin 12.4, hematocrit 36.3,  platelets 159.  Sodium 140, potassium 3.4, BUN 14, creatinine 0.8.  Troponins were negative.  LDL was 96, HDL 27, triglycerides 175.  Troponins were negative.  Hemoglobin A1c is 6.0.  BNP is 286.  TSH is  3.03.  Portable chest shows stable borderline cardiomegaly.  INR was 1.0  on admission.   DISPOSITION:  The patient is discharged in stable condition in sinus  rhythm, sinus bradycardia.  He will have a pro time on Monday.  He will  see Dr. Jacinto Halim back in a couple weeks.  Please note, we cut his beta-  blocker back because he was actively  wheezing.      Abelino Derrick, P.A.      Cristy Hilts. Jacinto Halim, MD  Electronically Signed    LKK/MEDQ  D:  04/11/2007  T:  04/11/2007  Job:  [id number]

## 2010-09-28 NOTE — H&P (Signed)
[name], [name]               ACCOUNT NO.:  [id number]   MEDICAL RECORD NO.:  [id number]          PATIENT TYPE:  INP   LOCATION:  1823                         FACILITY:  [facility]   PHYSICIAN:  [name], M.D.DATE OF BIRTH:  27-Oct-1955   DATE OF ADMISSION:  02/12/2008  DATE OF DISCHARGE:                              HISTORY & PHYSICAL   HISTORY:  [name] is a 55 year old male who came to the ER by the  direction of our office because of flecainide overdose.  This was a  mistaken overdose.  He puts his pills in a bottle in the morning and  then takes them.  He unfortunately ingested his bottle of flecainide  instead of his other bottle of daily pills.  He states he had 29  flecainide pills in that bottle.  EMS went to his home and he was  brought to the emergency room.  In the emergency room, he was seen by  Dr. Susa Griffins, Poison Control was called, and they gave  recommendations.  While in the emergency room, he did receive charcoal 1  gram/kg and he also received 2 doses of bicarb 1 mEq/kg each IV push.   PAST MEDICAL HISTORY:  Includes PAF, hypertension, and normal LV  function by cardiac catheterization without any significant coronary  disease.  He has a history of questionable cerebral aneurysm by CT  several years ago with no followup, and questionable seizure disorder,  never seen by a neurologist.  He is on anticoagulation for his PAF and  AODM.   MEDICATIONS AT HOME:  This is from a list of May 29, 2007 from our  office.  1. Simvastatin 20 mg at bedtime.  2. Lovaza 1000 mg 4 times a day.  3. Protonix 40 mg every day.  4. TriCor 48 mg every day.  5. Multivitamin every day.  6. Voltaren gel p.r.n.  7. Hydrocodone p.r.n.  8. Diltiazem 100 mg every day.  9. Flecainide 100 mg 1/2 tablet b.i.d. which he has been on since      November 2008.  10.Warfarin as directed.  11.Viagra p.r.n.   ALLERGIES:  NKDA.   FAMILY HISTORY:  Not consequential to  this admission.   SOCIAL HISTORY:  He is married and has 2 children.  He is self-employed  and he does Government social research officer.   REVIEW OF SYSTEMS:  He denies any chest pain, shortness of breath, or  indigestion.  He just feels fatigued.  This has been a chronic problem.  He does have blurry vision which may be an anti-cholinergic effect from  the flecainide.  He denies any lower extremity edema.  No black tarry  stools.  No unilateral weakness.   PHYSICAL EXAMINATION:  GENERAL:  He appears older than his age.  He is  morbidly obese.  HEENT:  He has periorbital swelling around his eyes.  His sclerae are  injected.  NECK:  He has 2+ carotids.  No bruits.  RESPIRATIONS:  He has some rhonchi and decreased breath sounds.  CARDIOVASCULAR:  His heart sounds are regular.  S1 and S2 are present.  No murmur or gallop is noted.  ABDOMEN:  Bowel sounds are present x4.  His abdomen is obese.  EXTREMITIES:  No lower extremity edema.  Moves all extremities x4.  NEUROLOGIC:  He has no focal deficits besides his blurry vision.   LABORATORY DATA:  His i-STAT showed that he had normal potassium, normal  BUN and creatinine, and his hemoglobin and hematocrit were normal.  Magnesium level was not back and INR is not back at this dictation.   EKG:  His initial EKG in the ER showed a widened QT of 489 milliseconds,  a widened QRS of 0.142.  After he was given bicarb, his QRS did decrease  to 0.125.  His QT interval was still 533, which was wider previous.   ASSESSMENT:  1. Flecainide overdose.  2. History of paroxysmal atrial fibrillation.  3. Normal left ventricular function.  4. Hypertension.  5. History of cerebral aneurysm by CT.  6. Questionable seizure disorder, not followed up.  7. Anticoagulation.   PLAN:  As stated before, Poison Control was called and pharmacist also  responded here in the emergency room.  He was given charcoal as stated  above and bicarb.  He is get bicarbonate on a p.r.n.  basis for QRS  greater than 0.14.  There is limit.  We should replace his electrolytes  including his magnesium.  If he has CT, the Poison Control suggest  lidocaine.  If his blood pressure decreases, we should give  norepinephrine.  As of this time, he has 3 IV access.  He will be moved  to CCU with close watch.      Lezlie Octave, N.P.      [name], M.D.  Electronically Signed    BB/MEDQ  D:  02/12/2008  T:  02/13/2008  Job:  [id number]

## 2010-09-28 NOTE — Assessment & Plan Note (Signed)
[facility]                        [place] CARDIOLOGY OFFICE NOTE   NAME:[name]  DATE:05/11/2010                            DOB:          [birth year]    HISTORY OF PRESENT ILLNESS:  [name] is a 55 year old gentleman who is  here today for a followup visit.  He has the following problem list:  1. Paroxysmal atrial fibrillation with normal LV systolic function.  2. No coronary artery disease per cardiac catheterization in November      2008.  3. COPD.  4. Hypertension.  5. Hyperlipidemia.  6. Tobacco use.  7. Obesity.  8. Previous history of excessive alcohol use.  9. History of brain aneurysm on CT scan.  However, CT angiography      performed in November 2009 was normal.  10.History of unintentional flecainide overdose in September 2009.   INTERVAL HISTORY:  [name] is here today for a followup visit.  Unfortunately, he continues to smoke.  He is still having significant  dyspnea and occasional wheezing.  He was given inhalers by Pulmonary.  He does not have any chest pain.  There has been no dizziness,  presyncope, or syncope.  He continues to complain of significant back  pain and has actually gained few pounds since his last visit.   MEDICATIONS:  1. Lisinopril 20/25 mg once daily.  2. Propranolol extended release 80 mg once daily.  This will be      stopped today.  This will be replaced with metoprolol succinate 50      mg once daily.  3. Fish oil 1200 mg 3 capsules a day.  4. Simvastatin 20 mg once daily.  5. Omeprazole 20 mg once daily.  6. Furosemide 40 mg once daily as needed.  7. Potassium chloride 10 mEq once daily as needed.  8. Aspirin 325 mg once daily.  9. Flecainide 100 mg twice daily.   PHYSICAL EXAMINATION:  VITAL SIGNS:  Weight is 262 pounds, blood  pressure is 144/81, pulse is 70, and oxygen saturation is 97% on room  air.  NECK:  No JVD or carotid bruits.  LUNGS:  There is decreased breath sounds bilaterally with faint  expiratory wheezing.  HEART:  Regular rate and rhythm with no gallops or murmurs.  ABDOMEN:  Benign, nontender, and nondistended.  EXTREMITIES:  With no clubbing, cyanosis, or edema.   IMPRESSION:  1. Paroxysmal atrial fibrillation:  Currently, he seems to be in sinus      rhythm, and has a regular rhythm.  He continues to be on flecainide      100 mg twice daily which seems to be controlling his symptoms.  His      CHADs score is 1 and thus we will continue with daily aspirin for      now.  We did an echocardiogram which showed normal LV systolic      function with only mild left ventricular hypertrophy.  Left atrium      was moderately dilated.  Interventricular septum was 12 mm and  posterior wall was 13 mm.  Thus, I think it is safe to continue      flecainide for now.  2. Hypertension:  Blood pressure is reasonable.  Given that he is      having some wheezing issues, I will go ahead and stop propranolol      which is a nonselective beta-blocker and put him on metoprolol      extended release 50 mg once daily.  We will continue also with      lisinopril and hydrochlorothiazide.  3. Dyspnea:  This seems to be due to chronic obstructive pulmonary      disease and continued smoking.  I had a prolonged discussion with      him about the importance of smoking cessation as well as lifestyle      changes.  Unfortunately, he continues to gain weight.  He has      significant physical deconditioning and will need significant      lifestyle changes.  He will follow up with me in 6 months from now      or earlier if needed.     Ryan Bears, MD  Electronically Signed    MA/MedQ  DD: 05/11/2010  DT: 05/12/2010  Job #: [id number]

## 2010-09-28 NOTE — Procedures (Signed)
EEG NUMBER:  [id number]   REFERRING PHYSICIAN:  Santina Evans A. Orlin Hilding, MD   CLINICAL HISTORY:  A 55 year old patient being evaluated for accidental  overdose of flecainide.   This is a portable EEG recorded with the patient awake and drowsy using  standard 10/20 electrode placement.   Background awake rhythm consists of 10 Hz alpha, which is of diminished  amplitude, synchronous reactive to eye-opening and closure.  No  paroxysmal epileptiform activities, spikes or sharp, waves are seen.  No  definite sleep stages are seen except mild drowsiness, which is  uneventful.  Hyperventilation not performed.  Photic stimulation is  unremarkable.  EKG tracing reveals regular sinus rhythm.  Length of the  recording is 20.7 minutes.  Technical component is average as excessive  muscle artifacts noted.   IMPRESSION:  This EEG performed during awake and drowsy state is within  normal limits.  No definite epileptiform features were noted.           ______________________________  Sunny Schlein. Pearlean Brownie, MD     EAV:[id number]  D:  02/14/2008 16:34:33  T:  02/14/2008 22:36:05  Job #:  [id number]

## 2010-09-28 NOTE — Assessment & Plan Note (Signed)
[facility]                        [place] CARDIOLOGY OFFICE NOTE   NAME:Vivona, ZAHID CARNEIRO                      MRN:          [id number]  DATE:01/11/2010                            DOB:          [birth year]    PROBLEMS LIST:  1. Paroxysmal atrial fibrillation.  2. No evidence of coronary artery disease, per cardiac catheterization      in November 2008, which was done at [facility]  3. Hypertension.  4. Hyperlipidemia.  5. Tobacco use.  6. Previous excessive alcohol use.  7. Questionable history of brain aneurysm.  However, most recent CT      angiogram was done in November 2009, was reported to be normal.   INTERVAL HISTORY:  The patient was seen recently for a followup visit.  He complained of increased dyspnea.  He is a chronic smoker and has been  smoking one pack per day for many years.  There was no chest pain.  An  echocardiogram was obtained for further evaluation.  Overall, he is  still feeling the same.  Most of his reported symptoms are dyspnea and  cough.  He has not been able to quit smoking.  During last visit, his  blood pressure was elevated and thus, lisinopril, hydrochlorothiazide  was increased.  Since then, there has been improvement in his blood  pressure readings.   Medications include:  1. Lisinopril/hydrochlorothiazide 20/12.5 mg once daily.  2. Propranolol 80 mg once daily.  3. Fish oil 1200 mg 3 capsules daily.  4. Simvastatin 20 mg daily.  5. Omeprazole 20 mg daily.  6. Multivitamin once daily.  7. Furosemide 40 mg once daily as needed.  8. Aspirin 325 mg once daily.  9. Flecainide 100 mg twice daily.  10.Viagra 100 mg as needed.   ALLERGIES:  No known drug allergies.   PHYSICAL EXAMINATION:  VITAL SIGNS:  Weight is 259.4 pounds, blood  pressure is 140/80, pulse is 70, oxygen saturation is 95% on room air.  NECK:  No JVD or carotid bruits.  LUNGS:  Bilateral expiratory wheezing.  CARDIOVASCULAR:   Regular rate and rhythm with no gallops or murmurs.  ABDOMEN:  Benign, nontender, and nondistended.  EXTREMITIES:  With no clubbing, cyanosis, or edema.   IMPRESSION:  1. Paroxysmal atrial fibrillation.  The patient seems to be stable      from this.  He has not had any episodes of atrial fibrillation      recently.  He continues to be on flecainide 100 mg twice daily.  He      does have left ventricular hypertrophy, but it is mild and his      interventricular septum is less than 1.4-cm.  Thus, it will be safe      to continue flecainide for now.  If he develops significant left      ventricular hypertrophy in the future, will need to switch him to      another antiarrhythmic medication.  Regarding long-term      anticoagulation, his CHADS score currently is one due to history of      hypertension.  However,  his recent blood work showed elevated      glucose level at 114.  He might be diabetic.  If that is confirmed,      then we might need to consider long-term anticoagulation with      warfarin or Pradaxa.  Interestingly, he used to be on warfarin      briefly in the past which was stopped due to questionable history      of brain aneurysm.  However, subsequently under with CT angiogram      which was reported to be normal.  This will be discussed again with      him during followup visit and we will consider starting      anticoagulation.  2. Significant dyspnea:  There does not seem to be a cardiac reason      for this.  He does not have coronary artery disease per cardiac      catheterization which was done in 2008.  He also had recent stress      test which was unremarkable.  Echocardiogram showed normal LV      systolic function.  He is wheezing today by physical exam.  I      suspect that this is due to undiagnosed COPD related to prolonged      history of smoking.  I will go ahead and refer him to pulmonary for      further evaluation.  3. Hypertension:  His blood pressure is  more controlled since      increasing the lisinopril/hydrochlorothiazide.  We will continue      with current medications.  4. The patient will follow up in 4 months from now or earlier if      needed.     Lorine Bears, MD  Electronically Signed    MA/MedQ  DD: 01/11/2010  DT: 01/12/2010  Job #: [id number]

## 2010-09-28 NOTE — Discharge Summary (Signed)
NAMEOSAMAH, [name]               ACCOUNT NO.:  [id number]   MEDICAL RECORD NO.:  [id number]          PATIENT TYPE:  INP   LOCATION:  2919                         FACILITY:  [facility]   PHYSICIAN:  Cristy Hilts. Jacinto Halim, MD       DATE OF BIRTH:  Sep 09, 1955   DATE OF ADMISSION:  02/12/2008  DATE OF DISCHARGE:  02/15/2008                               DISCHARGE SUMMARY   DISCHARGE DIAGNOSES:  1. Accidental flecainide overdose, stable at discharge.  2. History of paroxysmal atrial fibrillation and sick sinus syndrome,      maintaining sinus rhythm.  3. Coumadin therapy, discontinued this admission.  4. History of smoking.  5. History of seizure disorder, seen by Dr. Orlin Hilding this admission.   HOSPITAL COURSE:  The patient is a 54 year old male followed by Dr.  Jacinto Halim with history of PAF.  He has had normal LV function and normal  coronaries by catheterization in the past.  He has been on flecainide.  He accidentally took his whole bottle of flecainide on the day of  admission.  He then called his family doctor who sent him over to [place].  The patient usually puts always daily medicines in one bottle and then  takes that, but he grab the wrong bottle by mistake.  He was seen in the  emergency room by Dr. Alanda Amass.  He was stable.  QRS was narrow.  He  was in sinus rhythm at 64.  He was started on IV bicarb.  Renal function  was stable.  The patient was seen in consult by the Neurology Service  while he was here because of some leg pain and a history of abnormal  head CT.  The patient was set up for an EEG which was done.  Dr. Orlin Hilding  will follow him up as an outpatient.  Dr. Alanda Amass feels the patient  can be discharged on February 15, 2008.  Dr. Alanda Amass feels he can come  off flecainide altogether as well as Coumadin.  He apparently has been  maintaining sinus rhythm by his history.  He will follow up with Dr.  Jacinto Halim as an outpatient.  His EKG at discharge shows sinus rhythm at 62  with a QTc of  442.   LABS:  INR is 1.3.  Sodium 136, potassium 3.7, BUN 10, creatinine 0.7.  Liver functions are normal.  TSH is 2.93.  CBC shows a white count 8.5,  hemoglobin 13, hematocrit 37.7, platelets 144.  Today, his flecainide  level was 1.8 on admission.  Cholesterol shows total cholesterol of 161,  HDL of 25, LDL 85.  Chest x-ray shows no acute infiltrate or edema.  EEG  shows no epileptiform features noted.   DISCHARGE MEDICATIONS:  1. Micardis 80/12.5 one-half tablet a day.  2. Simvastatin 20 mg a day.  3. Lovaza 1 g twice a day.  4. Protonix 40 mg a day.  5. TriCor 48 daily.  6. Multivitamin daily.  7. Hydrocodone/acetaminophen 5/500 p.r.n. q.6.  8. Diltiazem 180 mg a day.  9. Viagra 100 mg a day p.r.n.  10.He will stop his flecainide  and start aspirin 325 mg a day.   DISPOSITION:  The patient is discharged in stable condition.  During his  hospitalization, he was counseled about smoking and says he was going to  quit on his own.  He will follow up with Dr. Jacinto Halim and Dr. Orlin Hilding.      Abelino Derrick, P.A.      Cristy Hilts. Jacinto Halim, MD  Electronically Signed    LKK/MEDQ  D:  02/15/2008  T:  02/16/2008  Job:  [id number]   cc:   Caryn Bee L. Little, M.D.

## 2010-09-28 NOTE — Assessment & Plan Note (Signed)
[facility]                        [facility] OFFICE NOTE   NAME:[name], [name]                      MRN:          [id number]  DATE:12/22/2009                            DOB:          1955/11/17    PROBLEMS LIST:  1. Paroxysmal atrial fibrillation.  2. Hypertension.  3. Hyperlipidemia.  4. Tobacco use.  5. Previous excessive alcohol use.   INTERVAL HISTORY:  The patient is here today for a followup visit.  He  seems to be doing reasonably well.  However, he is complaining of  increased dyspnea.  He continues to smoke one-pack per day.  He has been  trying to quit, but does not seem to have determination.  His episodes  of palpitations have decreased since the last time.  He is on flecainide  without Cardizem.  His blood pressure has not been controlled.  He  denies any chest pain.  There is no dizziness, presyncope, or syncope.   MEDICATIONS:  1. Lisinopril 10/12.5 mg once daily which will be increased today to      20/25 mg once daily.  2. Propranolol 80 mg once daily.  3. Fish oil 1200 mg 3 capsules daily.  4. Simvastatin 20 mg daily.  5. Omeprazole 20 mg daily.  6. Multivitamin once daily.  7. Furosemide 40 mg once daily as needed.  8. Aspirin 325 mg once daily.  9. Flecainide 100 mg twice daily.  10.He uses diltiazem 30 mg as needed.  11.Viagra 100 mg as needed.   ALLERGIES:  No known drug allergies.   PHYSICAL EXAMINATION:  GENERAL:  He is obese and in no acute distress.  VITAL SIGNS:  Weight is 262 pounds, blood pressure is 161/93, pulse is  69 and regular.  Oxygen saturation is 96% on room air.  HEENT:  Normocephalic and traumatic.  NECK:  No masses, JVD, or carotid bruits.  RESPIRATORY:  Normal respiratory effort with no use of accessory  muscles.  There is a slight expiratory wheezing.  CARDIOVASCULAR:  Normal PMI.  Normal S1 and S2.  There is a 1/6 systolic ejection murmur  at the aortic area with preserved S2.  ABDOMEN:  Benign, nontender, nondistended with no appreciated masses.  EXTREMITIES:  With no clubbing, cyanosis, or edema.  PSYCHIATRIC:  He is alert, oriented x3 with normal mood and affect.  SKIN:  No rash.  MUSCULOSKELETAL:  Unremarkable.   IMPRESSION:  1. Paroxysmal atrial fibrillation:  He has regular rhythm at this time      seems to be in sinus rhythm.  He is on flecainide 100 mg twice      daily and that seems to be controlling his symptoms.  His [place]      score is 1 and thus it is reasonable to continue with daily aspirin      for now.  There is questionable history of previous brain aneurysm.      He has not had a recent echocardiogram and with his uncontrolled      hypertension, I am concerned that he might have significant left      ventricular hypertrophy  that makes using flecainide unsafe in this      situation.  Due to that, I recommend obtaining an echocardiogram      which will be done.  Also, this will help [place] evaluate his heart      murmur which seems to be benign overall.  2. Hypertension.  His blood pressure is not well controlled.  We will      increase lisinopril, hydrochlorothiazide 20/25 mg once daily.  We      will have CMP checked in 1 week to ensure that his electrolytes are      reasonable.  3. Hyperlipidemia:  We will be obtaining fasting lipid profile with      his next labs as well.  4. Tobacco use:  The patient is still smoking and he was counseled      about importance of smoking cessation.  He does not seemed to be      determined at this time to quit, but would continue to discuss this      with him on subsequent visits.     Lorine Bears, MD  Electronically Signed    MA/MedQ  DD: 12/22/2009  DT: 12/23/2009  Job #: [id number]

## 2010-09-28 NOTE — Cardiovascular Report (Signed)
NAMECHON, BUHL NO.:  [id number]   MEDICAL RECORD NO.:  [id number]          PATIENT TYPE:  INP   LOCATION:  3707                         FACILITY:  [facility]   PHYSICIAN:  Nanetta Batty, M.D.   DATE OF BIRTH:  [birth year]   DATE OF PROCEDURE:  03/23/2007  DATE OF DISCHARGE:                            CARDIAC CATHETERIZATION   Mr. [name] is a very pleasant 55 year old married white male father of  two, grandfather to one grandchild, who was admitted with new onset  atrial fibrillation and chest pain by Dr. Yates Decamp yesterday.  He was  heparinized and ruled out for myocardial infarction.  He presents now  for diagnostic coronary arteriography to define his anatomy and rule out  an ischemic etiology.   PROCEDURE DESCRIPTION:  The patient was brought to the second floor  [place] [facility] cardiac catheterization laboratory in the postabsorptive  state.  He was premedicated with p.o. Valium.  His right groin was  prepped and shaved in the usual sterile fashion pills.  Xylocaine 1% was  used for local anesthesia.  A 6-French sheath was inserted into the  right femoral artery using standard Seldinger technique; 6-French right  and left Judkins diagnostic catheters along with a 6-French pigtail  catheter were used for selective coronary cholangiography, left  ventriculography respectively.  Visipaque dye was used for the entirety  of the case.  Aortic, left ventricular and pullback pressures were  recorded.   HEMODYNAMICS:  Aortic systolic pressure 127, diastolic pressure 73.   Left ventricular systolic pressure 113, end-diastolic pressure 26.  There is no pullback gradient noted.   SELECTIVE CORONARY ANGIOGRAPHY:  1. Left main normal.  2. LAD normal.  3. Left circumflex normal.  4. Right coronary artery was dominant and normal.   LEFT VENTRICULOGRAPHY:  RAO left ventriculogram was performed using 25  mL of Visipaque dye at 12 mL per second.  The overall LVEF was  estimated  at greater than 60% without focal wall motion abnormalities.   IMPRESSION:  Mr. [name] has normal coronaries and normal left ventricular  function.  He spontaneously converted from atrial fibrillation to sinus  rhythm.  He has lone atrial fibrillation.  An ACT was measured and the  sheath was  removed.  Pressure was held on the groin to achieve hemostasis.  The  patient left the laboratory in stable condition.  He will remain  recumbent for 5 hours, after which time he will be discharged home with  appropriate precautions.  He will be seen back in the office by Dr. Yates Decamp in 1-2 weeks.      Nanetta Batty, M.D.  Electronically Signed     JB/MEDQ  D:  03/23/2007  T:  03/23/2007  Job:  [id number]   cc:   2nd Floor [facility]  [facility]  Pateros R. Ryan Halim, MD  Aida Puffer

## 2010-09-28 NOTE — Consult Note (Signed)
[name], [name]               ACCOUNT NO.:  [id number]   MEDICAL RECORD NO.:  [id number]          PATIENT TYPE:  INP   LOCATION:  2909                         FACILITY:  [facility]   PHYSICIAN:  Gustavus Messing. Orlin Hilding, M.D.DATE OF BIRTH:  [birth year]   DATE OF CONSULTATION:  02/13/2008  DATE OF DISCHARGE:                                 CONSULTATION   CHIEF COMPLAINT:  Possible seizures.   HISTORY OF PRESENT ILLNESS:  This is a 55 year old white right-handed  male who arrived at [facility] ED secondary to mistakenly taking a  bottle of his flecainide instead of his usual morning pills.  At that  time, he realized he had possibly overdosed on his flecainide.  He came  to the emergency room.  The emergency room contacted toxicology in which  they were instructed to give him bicarbonate and charcoal.  At time of  interview, the patient was alert, oriented and talkative.  Reason for  consultation was the patient had explained to MD prior that he had  possibly suffered from seizures and he was told this from his primary  care physician, Dr. Clarene Duke in [place].  On interview, the patient states  that he does have staring spells which have improved over time and  decreased in frequency.  He describes these staring spells as being  dizzy, feeling as though he is not fully with it and at times fainting  or having ocular tearing.  The patient at one time had an episode in  which he had been driving with his wife and ended up in a city which he  did not know he was driving to.  The patient states his last spell was  approximately 1 week ago, but it was minor in his own words.  The  patient denies any automatism, any tonic-clonic movements.  The patient  states that he did of to a neurologist in [facility] at one time, but he  never followed up.  He states he also had an EEG at one time, but again  did not followup.  These exams were approximately 3 years ago.  The  patient is not on any  antiepileptic drugs at this time.   PAST MEDICAL HISTORY:  1. Positive for hypertension.  2. High cholesterol.  3. Paroxysmal atrial fibrillation.  4. Question of cerebral aneurysm by CT several years ago which he is      being worked up for here.  5. As stated, question for seizure disorder.  However, he states he      never followed up with his neurologist and has not been diagnosed      with seizure disorder at this time.   MEDICATIONS:  1. Simvastatin.  2. Lovaza.  3. Protonix.  4. Tricor.  5. Voltaren.  6. Hydroxycodone.  7. Diltiazem.  8. Flecainide.  9. Warfarin.  10.Viagra.  11.Alprazolam while he has been in the hospital.   ALLERGIES:  NO KNOWN DRUG ALLERGIES.   SOCIAL HISTORY:  He smokes approximately 2 packs per day.  He does drink  occasionally.  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  He lives with  his wife at home.   REVIEW OF SYSTEMS:  All 12 systems were reviewed.  The patient stated  positive for hypertension, hyperlipidemia, proximal atrial fibrillation,  syncopal episodes and decreased hearing in his right year.   PHYSICAL EXAMINATION:  VITAL SIGNS:  Blood pressure was 135/83, pulse  66, respirations 16, temperature 98.4.  MENTAL STATUS:  He is alert and oriented x3.  Carries out 2-3 step  commands without any difficulty.  CRANIAL NERVES:  Eyes:  Pupils  are equal, round, reactive to light and  accommodating.  Conjugate gaze.  Extraocular muscles are intact.  Visual  fields are  intact.  Face is symmetrical.  Tongue midline.  Uvula  midline.  The patient has no dysarthria.  No aphasia.  No slurred  speech.  Facial sensation V1-V3 is normal.  Shoulder shrug and head turn  is normal.  Coordination:  Finger-to-nose and heel-to-shin is normal.  Gait is within normal limits.  He has good balance.  Motor:  He is 5/5  in all extremities.  He does have what looks to be either clonus or  essential tremor of his right upper extremity.  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  The patient has no  drift.  Deep tendon reflexes are 2+ throughout with downgoing toes  bilaterally.  PULMONARY:  Clear to auscultation bilaterally.  CARDIOVASCULAR:  S1 and S2 are audible.  No murmurs.  NECK:  Negative for bruits.  Supple at this time.  EXTREMITIES:  Sensation is decreased to light touch over his right  webspace between his thumb and index finger, and decreased over the  bilateral palms and bilateral distal aspects of his feet.  The patient  states this has been going on for quite some time and attributes this to  his cervical spine impingement.  He denies any diabetes or  hypothyroidism at this time.   LABORATORY DATA:  Sodium 144, potassium 4.1, chloride 106, CO2 is 28,  BUN is 10, creatinine 0.83, glucose is 111, white blood cell count is  8.6, hemoglobin/hematocrit 13.7 and 40.3, platelets  148, triglycerides  256, cholesterol 161, HDL 25, LDL is 85.  Magnesium is within normal  limits.   DIAGNOSTICS:  1. MRI is pending at this time.  2. EEG is pending at this time.   ASSESSMENT:  This is a 55 year old male with history of  seizure-like  staring spells.  No focal cranial deficits or seizure activity were seen  during the exam.  Differential diagnosis for this is seizure versus  syncopal episode.  The patient does have paroxysmal atrial fibrillation.   TREATMENT/PLAN:  While the patient is admitted, an EEG would be of  benefit.  At this time, no AED  drugs would be recommended.  MRI/MRA as  outpatient as the patient requests an open magnetic resonance machine.  This is not an urgent scenario.  We will follow up with this patient  while in hospital and follow his EEG. Further management as an  outpatient as indicated.   Thank you very much for the consult     ______________________________  Ryan Morn, PA-C      Gustavus Messing.  Orlin Hilding, M.D.  Electronically Signed    DS/MEDQ  D:  02/13/2008  T:  02/13/2008  Job:  [id number]

## 2010-09-28 NOTE — Assessment & Plan Note (Signed)
[facility]                        [facility]IOLOG[facility] OFFICE NOTE   NAME:Fowles, DAMEIN GAUNCE                      MRN:          [id number]  DATE:05/27/2009                            DOB:          12/05/1955    CHIEF COMPLAINT:  Chest discomfort.   HISTORY OF PRESENT ILLNESS:  Mr. Ligman is a 55 year old white male who  has a past medical history significant for paroxysmal atrial  fibrillation rhythm controlled with flecainide, hypertension, and  hyperlipidemia who is presenting with a 1-week history of intermittent  chest discomfort.  The patient states that he intermittently feels  palpitations and reported to [facility] ED approximately 1 week ago with  chest pressure and palpitations.  He was in atrial flutter and  apparently converted to normal rhythm.  He requested discharge from the  hospital and was sent home.  The patient states that for the past week  he has had intermittent chest discomfort throughout the day.  Oftentimes, it occurs several times throughout the day.  It is not  related to activity.  It occasionally radiates to his left shoulder and  associated with shortness of breath.  He states that it self resolves  within several minutes.  This is also sometimes accompanied with  palpitations or symptoms that are not inconsistent with atrial  fibrillation.  The patient has been compliant with medications except he  occasionally misses his evening flecainide dose.   PAST MEDICAL HISTORY:  As above in HPI.  In addition, the patient has a  history of obstructive sleep apnea, elevated LFTs, acute appendicitis  status post appendectomy, and some right-sided facial trauma.   SOCIAL HISTORY:  He smokes 2 packs of cigarettes a day.  However, he and  his wife plan on quitting in the New Year.  Alcohol, the patient states  he drinks occasionally.   FAMILY HISTORY:  Negative for premature coronary artery disease.   ALLERGIES:  No known drug  allergies.   MEDICATIONS:  1. Aspirin 325 daily.  2. Flecainide 50 mg b.i.d.  3. Diltiazem 180 mg daily.  4. Furosemide 40 mg p.r.n.  5. Omeprazole 20 mg daily.  6. Simvastatin 20 mg daily.  7. Fish oil.  8. Propranolol 80 mg daily.  9. Lisinopril/HCT 10/12.5 mg daily.   REVIEW OF SYSTEMS:  Positive for history of overdose on flecainide.  The  patient states that he accidentally took several flecainide tablets as  he was confused about his medications.  He ended up in the hospital;  however, he has recovered from that.  He states he was on 100 mg b.i.d.  prior to that accident and now he is currently taking 50 mg twice a day.  The patient also endorses occasional fatigue.  He also states that he  occasionally has spells where he becomes somewhat disoriented as to his  location and how to get where he wants to go.  He has been worked up by  Neurology in the past; however, we have do not have those records  available.  The patient also endorses some headaches, pain in his feet  when he  walks that is intermittent.  He also has occasional dizzy  episodes but denies any syncope.  Other systems as in HPI, otherwise  negative.   PHYSICAL EXAMINATION:  VITAL SIGNS:  Blood pressure is 122/73, pulse is  72, and he is saturating 95% on room air.  He weighs 251 pounds.  GENERAL:  No acute distress.  HEENT:  Normocephalic and atraumatic.  NECK:  Supple.  There is no JVD.  There are no carotid bruits.  HEART:  Regular rate and rhythm without murmur, rub, or gallop.  LUNGS:  Clear bilaterally.  ABDOMEN:  Soft, nontender, and nondistended.  EXTREMITIES:  Without edema.  SKIN:  Warm and dry.  PULSES:  The patient has 2+ bilateral carotid and posterior tibial  pulses.   EKG from today independently interpreted by myself demonstrates normal  sinus rhythm with some mild baseline artifact.  There are no significant  ST or T-wave abnormalities.  Review of the patient's labs from his  emergency  room visit dated May 19, 2009; sodium 141, potassium 4.1,  chloride 105, CO2 of 28, BUN 10, creatinine 0.7, and glucose 123.  LFTs  were within normal limits with a bili of 0.4, alk phos 93, AST 24, ALT  31, protein 6.3, albumin 3.8, and calcium 9.5.  His troponin was less  than 0.05, CK-MB was less than 1.0, and his myoglobin was 53.  CBC had a  white count of 7, hemoglobin 15, hematocrit 43, and platelet count of  157.   ASSESSMENT:  A 55 year old white male with known paroxysmal atrial  fibrillation and several risk factors for coronary artery disease  including tobacco use, hypertension, and hyperlipidemia presenting with  new-onset chest discomfort and worsening palpitations.  It is certainly  possible that the patient's chest discomfort represents angina, although  it also has some atypical features.  The patient has a document episode  of atrial flutter approximately one week ago at his primary care  doctor's office and is certainly possible that this chest discomfort he  is having at home could be a manifestation of arrhythmia.   PLAN:  We will proceed with a stress echocardiogram within the next 48  hours.  I will also place the patient on a 48-hour Holter monitor to  ascertain any atrial arrhythmias that he may be having at home.  Once  obstructive coronary artery disease is ruled out, if the patient is  having some further atrial arrhythmias, we are considering increasing  flecainide back to 100 mg twice a day which he was taking in the past as  he states that this dose seems to be better in controlling his  arrhythmias.    Brayton El, MD  Electronically Signed   SGA/MedQ  DD: 05/27/2009  DT: 05/28/2009  Job #: [id number]

## 2010-09-28 NOTE — H&P (Signed)
[name], [name]               ACCOUNT NO.:  [id number]   MEDICAL RECORD NO.:  [id number]          PATIENT TYPE:  INP   LOCATION:  5121                         FACILITY:  [facility]   PHYSICIAN:  Wilmon Arms. Corliss Skains, M.D. DATE OF BIRTH:  1955-07-16   DATE OF ADMISSION:  12/25/2008  DATE OF DISCHARGE:                              HISTORY & PHYSICAL   CARDIOLOGIST:  [facility], used to be Dr. Jacinto Halim  prior to his leaving.   CHIEF COMPLAINT:  Abdominal pain.   HISTORY OF PRESENT ILLNESS:  [name] is a 55 year old white male with  a history of hypertension, supraventricular tachycardia, paroxysmal  atrial fibrillation, and a questionable seizure disorder who began  having abdominal pain yesterday.  He states it was diffuse and then  located in his right lower quadrant.  He has not had any associated  nausea, vomiting, fevers, or chills.  He felt that maybe he was  constipated yesterday and so took a laxative which caused diarrhea.  He  has not since had any flatus or bowel movements.  His pain persisted  into today and hence came to the emergency department where he then had  a CT scan of the abdomen and pelvis done.  Subsequently, this showed  acute appendicitis with extensive edema surrounding the appendix.  At  this time, we were asked to see the patient.   REVIEW OF SYSTEMS:  Please see HPI, otherwise all other systems are  negative.   PAST MEDICAL HISTORY:  1. Paroxysmal atrial fibrillation for which he used to be on Coumadin      but no longer is.  2. 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  3. Hypertension.  4. Possible history of supraventricular tachycardia.  5. Last cardiac catheterization in 2008 reveals a normal left      ventricular function.   PAST SURGICAL HISTORY:  1. Neck surgery.  2. Right shoulder surgery.  3. Right knee surgery.  4. Tonsillectomy.  5. Left inguinal hernia repair when he was a small child.  Otherwise, no other major abdominal  surgeries.   SOCIAL HISTORY:  The patient is married.  He does smoke approximately 2  packs of cigarettes a day, and he does drink hard liquor occasionally  throughout the week.   ALLERGIES:  LATEX.   MEDICATIONS:  1. Furosemide 40 mg p.r.n.  2. Simvastatin 20 mg daily.  3. Lisinopril 5 mg daily.  4. Diltiazem HCl extended release 180 mg daily.  5. Flecainide 50 mg daily.  6. Pantoprazole 40 mg daily.  7. Aspirin 325 mg daily.  8. Lovaza, dose unknown.   PHYSICAL EXAMINATION:  GENERAL:  [name] is a pleasant 55 year old  white male who is currently sitting up on a stretcher in no acute  distress.  VITAL SIGNS:  Temperature 98.3, pulse 74, respirations 18, and blood  pressure 134/79.  HEENT:  Head is normocephalic and atraumatic.  Sclerae noninjected.  Pupils are equal, round, and reactive to light.  Ears and nose without  any obvious masses or lesions.  No rhinorrhea.  Mouth is pink and moist.  Throat shows no  exudate.  NECK:  Supple.  Trachea is midline.  HEART:  Regular rate and rhythm.  Normal S1 and S2.  No murmurs,  gallops, or rubs noted.  A +2 carotid and pedal pulses bilaterally.  LUNGS:  Noted to have wheezes throughout.  Otherwise, no rhonchi or  rales are noted.  Respiratory effort is nonlabored.  ABDOMEN:  Soft, yet somewhat distended.  He is somewhat tympanitic.  He  does have active bowel sounds and is point tender in the right lower  quadrant.  No masses or hernias are noted.  MUSCULOSKELETAL:  All 4 extremities are symmetrical with no cyanosis,  clubbing, or edema.  NEURO:  Cranial nerves II through XII appear to be grossly intact.  PSYCH:  The patient is alert and oriented x3 with an appropriate affect.   LABORATORY DATA AND DIAGNOSTICS:  White blood cell count 10,600,  hemoglobin 14.9, hematocrit 43.3, and platelet count 170,000.  Sodium  140, potassium 4.0, glucose 115, BUN 14, and creatinine 0.70.  CT scan  of the abdomen and pelvis reveals acute  appendicitis with extensive  surrounding edema.   IMPRESSION:  1. Acute appendicitis.  2. History of paroxysmal atrial fibrillation.  3. History of hypertension.  4. History of possible seizure disorder.   PLAN:  At this time, we will admit the patient and take him to the  operating room for hopeful laparoscopic appendectomy, possible open.  He  will be given Zosyn as well as several other p.r.n. medications for pain  and  nausea.  He will be placed on IV fluids.  At this time, I have contacted  [facility] and requested a cardiac consult,  so they can follow the patient along with this postoperatively to make  sure he does not have any cardiac complications.      Letha Cape, PA      Wilmon Arms. Tsuei, M.D.  Electronically Signed    KEO/MEDQ  D:  12/25/2008  T:  12/26/2008  Job:  [id number]   cc:   [facility]

## 2010-10-01 NOTE — Op Note (Signed)
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   PROCEDURE:  Direct laryngoscopy with removal of right anterior vocal cord  mass and right lateral pyriform sinus wall papilloma.   SURGEON:  Lucky Cowboy, MD   ANESTHESIA:  General.   ESTIMATED BLOOD LOSS:  None.   COMPLICATIONS:  None.   INDICATIONS:  This patient is a 55 year old male with a 1-year history of  hoarseness. This is getting worse over the past few months. There is mild  dysphagia, but no throat pain. There is substantial alcohol and tobacco  intake.  Fiberoptic exam in the office revealed polypoid degeneration of  both vocal cords as well as a leukoplakic mass on the superior surface of  the left vocal cord. This did not clear with coughing. For these reasons,  surgery is performed.   FINDINGS:  The patient was noted to have a benign appearing right anterior  vocal cord mass. There was also a papillomatous appearing mass in the right  lateral pyriform sinus wall.   PROCEDURE:  The patient was taken to the operating room and placed on the  table in the supine position. He was then placed under general endotracheal  anesthesia; and the table rotated counterclockwise 90 degrees. The neck was  gently extended. The head and body were draped. The upper gingiva was  protected. A laryngoscope was then placed intraorally and suspended. This  allowed visualization with a trans-scope with the  microscope. The entire  endoglottis was examined. A pledget of epinephrine was placed in the  glottis. The findings were as noted above.   The abnormality/mass was grasped with forceps and directed medially. A small  sickle knife was then used to excise the mass in total. There was no  bleeding. This was sent to pathology. Photographs were taken. The microscope  was then removed and laryngoscope used to visualize other areas of the  endolarynx. The finding was then noted of the right lateral pyriform sinus  squamous papilloma. This, again, was visualized using the microscope and  resected using scissors and cup forceps. This was sent pathology.   Lidocaine was then sprayed on the vocal cords for minimization of  laryngospasm. The laryngoscope was removed noting no damage to the teeth or  soft tissues. The patient was awakened from anesthesia and the table rotated  clockwise 90-degrees. He was awakened from anesthesia and taken to the  postanesthesia care unit in stable condition. There were no complications.      Lucky Cowboy, MD  Electronically Signed  SJ/MEDQ  D:  01/13/2005  T:  01/13/2005  Job:  [id number]   cc:   [facility]   Sharlet Salina, M.D.  [address]  [place]  [place] [address]  Fax: [phone]   Hayden Rasmussen, M.D.

## 2010-10-01 NOTE — Discharge Summary (Signed)
[name], [name]               ACCOUNT NO.:  [id number]   MEDICAL RECORD NO.:  [id number]          PATIENT TYPE:  INP   LOCATION:  3707                         FACILITY:  [facility]   PHYSICIAN:  Darcella Gasman. [place], N.P.  DATE OF BIRTH:  1956/04/07   DATE OF ADMISSION:  03/22/2007  DATE OF DISCHARGE:  03/23/2007                               DISCHARGE SUMMARY   DISCHARGE DIAGNOSES:  1. Chest heaviness and shortness of breath with new-onset of atrial      fibrillation.      a.     Negative myocardial infarction.      b.     Normal coronary arteries and LV function.      c.     Known presentation of atrial fibrillation, resolved.  2. Excessive alcohol use.  3. Tobacco abuse counseled.  4. Hypertension.  5. Hyperlipidemia.  6. Diet controlled diabetes mellitus 2.  7. Obesity.   DISCHARGE CONDITION:  Stable and improved in sinus rhythm.   DISCHARGE MEDICATIONS:  1. Aspirin 325 daily.  2. Micardis 80 mg daily/HCTZ 12.5 daily.  3. Zocor 20 mg daily.  4. :Lovaza 2 tabs twice a day.  5. Lopressor 100 mg twice a day.  6. Tricor 48 mg at supper time. He may hold until he sees Dr. Jacinto Halim.   DISCHARGE INSTRUCTIONS:  1. Low sodium heart healthy diabetic diet.  2. Wash right groin cath site with soap and water.  3. Call if any bleeding or swelling or drainage.  4. Increase activity slowly.  5. No lifting for two days.  6. No driving for two days.  7. See Dr. Jacinto Halim 04/04/2007, at 10:45 a.m.   HISTORY OF PRESENT ILLNESS:  This 56 year old white male was seen in the  office on 03/22/2007, secondary to chest heaviness, shortness of breath,  mild diaphoresis and palpitations.  The symptoms began around 2:00  o'clock in the morning. He sat up and felt better, but then they  returned around 3:30 or 4:00 in the morning with the heaviness,  palpitations and shortness of breath. He was seen by his primary doctor,  Dr. Aida Puffer in outpatient clinic, was given IV Verapamil and oral  beta blocker  and then referred to Dr. Jacinto Halim for cardiac evaluation. By  the time he was seen by Dr. Jacinto Halim, he was feeling better, had mild  heaviness, mild shortness of breath. He was aware that his heart beat  was irregular, though the rate was controlled. He was then admitted to  [facility] for further evaluation to insure he did not have ischemia  leading to the atrial fibrillation and he agreed to undergo heart  catheterization.   PAST MEDICAL HISTORY:  In addition to above, he is a diabetic,  hyperlipidemia and hypertension.   ALLERGIES:  No known allergies.   PAST SURGICAL HISTORY:  Shoulder surgery in 2006. Throat surgery with  resection of adenoma in 2006. Left knee arthroplasty in 2005. Cervical  disk disease repair in 1998, with placement of a metal rod.   SOCIAL HISTORY:  He does smoke two packs a day and drinks  six to eight  shots of whiskey three to four times a week.   FAMILY HISTORY, SOCIAL HISTORY AND REVIEW OF SYSTEMS:  See H and P.   PHYSICAL EXAMINATION AT DISCHARGE:  Blood pressure 106/64. Pulse 71.  Respirations 18. Oxygen saturation 93%. Accu-check 123. Temperature  97.7. Right groin cath site stable without hematoma. Sinus rhythm. Heart  sounds regular rate and rhythm.   LABORATORY DATA:  Hemoglobin on admission 15.8, hematocrit 45.1, WBC  9.2, platelets 231. Pro-Time 13, INR of 1, PTT 28. He had a heparin  level. Chemistries: Sodium 139, potassium 3.8, chloride 103, CO2 28,  glucose 135, BUN 13, creatinine 0.94. These remained stable. GFR was  greater than 16. Total protein 7. Albumin 4.1. AST 46, ALT 60, slightly  elevated. Alkaline phosphatase 88. Total bilirubin 0.6, magnesium 1.8,  glycohemoglobin 6.0.   All cardiac enzymes were negative 52, 70, 36. MB 2.1, 1.8, 1.4 and  troponin I negative at 0.02 and 0.03.   Cholesterol 155. Triglycerides 42.6. HDL 27. Unable to get LDL. TSH  2.144.   Chest x-ray mild cardiomegaly without evidence of acute  cardiopulmonary  disease. EKG: The patient had converted to sinus rhythm, nonspecific T  wave changes on 11/6, and on the 11/7, nonspecific EKG changes were  resolved.   PROCEDURE:  Left heart cath by Dr. Allyson Sabal.  It was negative for coronary  disease. He had normal LV function.   HOSPITAL COURSE:  The patient was admitted from the office due to chest  pressure, shortness of breath and atrial fibrillation that was rate  controlled. He was placed on telemetry and underwent cardiac  catheterization the next morning. Cardiac enzymes were all negative for  MI. He converted to sinus rhythm without further chest discomfort and  his cardiac cath was within normal limits.  He was counseled on tobacco  cessation and given information.  The patient was stable by the time he  was discharged and will follow up with Dr. Jacinto Halim as well as primary  care.      Darcella Gasman. Annie Paras, N.P.     LRI/MEDQ  D:  04/30/2007  T:  05/01/2007  Job:  [id number]   cc:   Cristy Hilts. Jacinto Halim, MD

## 2010-11-01 ENCOUNTER — Ambulatory Visit: Payer: Self-pay | Admitting: Cardiovascular Disease

## 2010-11-03 ENCOUNTER — Encounter: Payer: Self-pay | Admitting: Cardiovascular Disease

## 2010-11-04 ENCOUNTER — Ambulatory Visit (INDEPENDENT_AMBULATORY_CARE_PROVIDER_SITE_OTHER): Payer: PRIVATE HEALTH INSURANCE | Admitting: Cardiovascular Disease

## 2010-11-04 ENCOUNTER — Encounter: Payer: Self-pay | Admitting: Cardiovascular Disease

## 2010-11-04 VITALS — BP 139/82 | HR 67 | Ht 75.0 in | Wt 244.0 lb

## 2010-11-04 DIAGNOSIS — I4891 Unspecified atrial fibrillation: Secondary | ICD-10-CM

## 2010-11-04 DIAGNOSIS — I10 Essential (primary) hypertension: Secondary | ICD-10-CM

## 2010-11-04 DIAGNOSIS — Z720 Tobacco use: Secondary | ICD-10-CM | POA: Insufficient documentation

## 2010-11-04 DIAGNOSIS — F17200 Nicotine dependence, unspecified, uncomplicated: Secondary | ICD-10-CM

## 2010-11-04 DIAGNOSIS — I480 Paroxysmal atrial fibrillation: Secondary | ICD-10-CM | POA: Insufficient documentation

## 2010-11-04 MED ORDER — WARFARIN SODIUM 5 MG PO TABS: 5.0000 mg | ORAL_TABLET | ORAL | Status: DC

## 2010-11-04 NOTE — Patient Instructions (Signed)
Your physician recommends that you schedule a follow-up appointment in: 6 months  Your physician recommends that you schedule a follow-up appointment in: 9:35 on 11/10/10 for coumadin   Your physician recommends that you return for lab work in: on Wed in the [place] office

## 2010-11-04 NOTE — Assessment & Plan Note (Signed)
Had a prolonged discussion with him about the importance of smoking cessation.

## 2010-11-04 NOTE — Assessment & Plan Note (Signed)
The patient is maintaining mostly in sinus rhythm with current treatment that include flecainide as well as propranolol. He was recently diagnosed with type 2 diabetes. Thus, his current CHADS score is 2 and not want anymore. I think he would benefit from long-term anticoagulation with warfarin. Risks and benefits were discussed with him. We'll go ahead and start him on 5 mg daily. he used to be on this in the past. He will be an old in our anticoagulation clinic. Will order a basic labs for next week that include CBC and basic metabolic profile with his PT/INR. He informs me that he had labs done in the last 2 months which were unremarkable.

## 2010-11-04 NOTE — Progress Notes (Signed)
HPI  This is a 55 year old male who is here today for followup visit. He has a history of paroxysmal atrial fibrillation but has been maintained in sinus rhythm with Flecainide. He also has history of COPD and active tobacco use. He has history of hypertension and most recently he was diagnosed with type 2 diabetes. He has no previous history of stroke. He used to be on anticoagulation on the past for brief time. He has no other bleeding history. He has been doing reasonably well from a cardiac standpoint he denies any chest pain, dyspnea, palpitations or syncope. He has not had any prolonged episodes of atrial fibrillation recently.  No Known Allergies   Current Outpatient Prescriptions on File Prior to Visit  Medication Sig Dispense Refill  . aspirin 325 MG tablet Take 325 mg by mouth daily.        [name] b complex vitamins tablet Take 1 tablet by mouth daily.        . flecainide (TAMBOCOR) 100 MG tablet Take 100 mg by mouth 2 (two) times daily.        . furosemide (LASIX) 40 MG tablet Take 40 mg by mouth daily as needed.        [name] lisinopril-hydrochlorothiazide (PRINZIDE,ZESTORETIC) 20-25 MG per tablet Take 1 tablet by mouth daily.        . Multiple Vitamin (MULTIVITAMIN) tablet Take 1 tablet by mouth daily.        . Omega-3 Fatty Acids (FISH OIL) 1200 MG CAPS Take by mouth daily.        [name] omeprazole (PRILOSEC) 20 MG capsule Take 20 mg by mouth 2 (two) times daily.        . potassium chloride (KLOR-CON) 10 MEQ CR tablet Take 10 mEq by mouth daily.        . propranolol (INDERAL) 80 MG tablet Take 80 mg by mouth daily.        . sildenafil (VIAGRA) 100 MG tablet Take 100 mg by mouth daily as needed.        . simvastatin (ZOCOR) 20 MG tablet Take 20 mg by mouth at bedtime.        . temazepam (RESTORIL) 30 MG capsule Take 30 mg by mouth at bedtime as needed.           Past Medical History  Diagnosis Date  . COPD (chronic obstructive pulmonary disease)   . HTN (hypertension)   . HLD  (hyperlipidemia)   . Obesity   . Brain aneurysm 2009    questionable. A follow up CTA in 2009 showed no evidence of  . Dyspnea   . OSA (obstructive sleep apnea)   . PAF (paroxysmal atrial fibrillation)   . Tobacco abuse   . Diabetes mellitus   . Overdose 2009    unintentional Flacanide overdose     Past Surgical History  Procedure Date  . Appendectomy      Family History  Problem Relation Age of Onset  . Heart disease       History   Social History  . Marital Status: Married    Spouse Name: N/A    Number of Children: 2  . Years of Education: N/A   Occupational History  . Curator    Social History Main Topics  . Smoking status: Current Everyday Smoker -- 2.0 packs/day  . Smokeless tobacco: Not on file  . Alcohol Use: Yes  . Drug Use: No  . Sexually Active: Not on file   Other Topics Concern  .  Not on file   Social History Narrative  . No narrative on file      PHYSICAL EXAM   BP 139/82  Pulse 67  Ht 6\' 3"  (1.905 m)  Wt 244 lb (110.678 kg)  BMI 30.50 kg/m2  SpO2 96% Constitutional: He is oriented to person, place, and time. He appears well-developed and well-nourished. No distress.  HENT: No nasal discharge.  Head: Normocephalic and atraumatic.  Eyes: Pupils are equal, round, and reactive to light. Right eye exhibits no discharge. Left eye exhibits no discharge.  Neck: Normal range of motion. Neck supple. No JVD present. No thyromegaly present.  Cardiovascular: Normal rate, regular rhythm, normal heart sounds and intact distal pulses. Exam reveals no gallop and no friction rub.  No murmur heard.  Pulmonary/Chest: Effort normal and breath sounds normal. No stridor. No respiratory distress. He has no wheezes. He has no rales. He exhibits no tenderness.  Abdominal: Soft. Bowel sounds are normal. He exhibits no distension. There is no tenderness. There is no rebound and no guarding.  Musculoskeletal: Normal range of motion. He exhibits no edema and no  tenderness.  Neurological: He is alert and oriented to person, place, and time. Coordination normal.  Skin: Skin is warm and dry. No rash noted. He is not diaphoretic. No erythema. No pallor.  Psychiatric: He has a normal mood and affect. His behavior is normal. Judgment and thought content normal.       EKG: Normal sinus rhythm with no significant ST or T wave changes. Normal PR and QT intervals.   ASSESSMENT AND PLAN

## 2010-11-04 NOTE — Assessment & Plan Note (Signed)
Blood pressure is well controlled 

## 2010-11-10 ENCOUNTER — Ambulatory Visit (INDEPENDENT_AMBULATORY_CARE_PROVIDER_SITE_OTHER): Payer: PRIVATE HEALTH INSURANCE | Admitting: *Deleted

## 2010-11-10 DIAGNOSIS — Z7901 Long term (current) use of anticoagulants: Secondary | ICD-10-CM | POA: Insufficient documentation

## 2010-11-10 DIAGNOSIS — I4891 Unspecified atrial fibrillation: Secondary | ICD-10-CM

## 2010-11-15 ENCOUNTER — Ambulatory Visit (INDEPENDENT_AMBULATORY_CARE_PROVIDER_SITE_OTHER): Payer: PRIVATE HEALTH INSURANCE | Admitting: *Deleted

## 2010-11-15 DIAGNOSIS — I4891 Unspecified atrial fibrillation: Secondary | ICD-10-CM

## 2010-11-15 LAB — POCT INR: INR: 1.6

## 2010-11-22 ENCOUNTER — Ambulatory Visit (INDEPENDENT_AMBULATORY_CARE_PROVIDER_SITE_OTHER): Payer: PRIVATE HEALTH INSURANCE | Admitting: *Deleted

## 2010-11-22 DIAGNOSIS — I4891 Unspecified atrial fibrillation: Secondary | ICD-10-CM

## 2010-11-29 ENCOUNTER — Ambulatory Visit (INDEPENDENT_AMBULATORY_CARE_PROVIDER_SITE_OTHER): Payer: PRIVATE HEALTH INSURANCE | Admitting: *Deleted

## 2010-11-29 DIAGNOSIS — I4891 Unspecified atrial fibrillation: Secondary | ICD-10-CM

## 2010-12-13 ENCOUNTER — Ambulatory Visit (INDEPENDENT_AMBULATORY_CARE_PROVIDER_SITE_OTHER): Payer: PRIVATE HEALTH INSURANCE | Admitting: *Deleted

## 2010-12-13 DIAGNOSIS — I4891 Unspecified atrial fibrillation: Secondary | ICD-10-CM

## 2010-12-13 LAB — POCT INR: INR: 2.2

## 2011-01-03 ENCOUNTER — Encounter: Payer: PRIVATE HEALTH INSURANCE | Admitting: *Deleted

## 2011-02-02 ENCOUNTER — Ambulatory Visit (INDEPENDENT_AMBULATORY_CARE_PROVIDER_SITE_OTHER): Payer: PRIVATE HEALTH INSURANCE | Admitting: *Deleted

## 2011-02-02 DIAGNOSIS — I4891 Unspecified atrial fibrillation: Secondary | ICD-10-CM

## 2011-02-02 LAB — POCT INR: INR: 1.8

## 2011-02-14 LAB — DIFFERENTIAL
Basophils Absolute: 0
Eosinophils Relative: 1
Lymphocytes Relative: 16
Lymphs Abs: 1.2
Monocytes Absolute: 0.6
Monocytes Relative: 8

## 2011-02-14 LAB — BASIC METABOLIC PANEL
BUN: 10
BUN: 9
CO2: 26
Calcium: 8.9
Calcium: 8.9
Calcium: 8.9
Chloride: 103
Chloride: 104
Chloride: 105
Chloride: 105
Chloride: 105
Chloride: 106
Chloride: 105
Creatinine, Ser: 0.72
Creatinine, Ser: 0.73
Creatinine, Ser: 0.78
Creatinine, Ser: 0.83
Creatinine, Ser: 0.73
GFR calc Af Amer: >60
GFR calc Af Amer: >60
GFR calc Af Amer: >60
GFR calc Af Amer: >60
GFR calc Af Amer: >60
GFR calc Af Amer: >60
GFR calc Af Amer: >60
GFR calc non Af Amer: >60
GFR calc non Af Amer: >60
GFR calc non Af Amer: >60
GFR calc non Af Amer: >60
GFR calc non Af Amer: >60
GFR calc non Af Amer: >60
Glucose, Bld: 125 — ABNORMAL HIGH
Potassium: 3.3 — ABNORMAL LOW
Potassium: 3.5
Potassium: 3.6
Sodium: 143

## 2011-02-14 LAB — COMPREHENSIVE METABOLIC PANEL
AST: 63 — ABNORMAL HIGH
Albumin: 3.7
Albumin: 3.4 — ABNORMAL LOW
BUN: 10
Chloride: 105
Chloride: 105
Creatinine, Ser: 0.72
Creatinine, Ser: 0.73
GFR calc Af Amer: >60
Glucose, Bld: 116 — ABNORMAL HIGH
Total Bilirubin: 0.9
Total Bilirubin: 0.9

## 2011-02-14 LAB — CBC
Hemoglobin: 14.6
MCHC: 34.4
MCV: 94
Platelets: 148 — ABNORMAL LOW
Platelets: 168
RBC: 4.28
RBC: 4.01 — ABNORMAL LOW
RDW: 13.4
RDW: 13.3
WBC: 7.4
WBC: 8.6

## 2011-02-14 LAB — APTT: aPTT: 20 — ABNORMAL LOW

## 2011-02-14 LAB — POCT I-STAT, CHEM 8
BUN: 12
Chloride: 106
Potassium: 4
Sodium: 142

## 2011-02-14 LAB — MAGNESIUM
Magnesium: 1.9
Magnesium: 1.9
Magnesium: 2
Magnesium: 2
Magnesium: 2.1

## 2011-02-14 LAB — LIPID PANEL
Cholesterol: 161
HDL: 25 — ABNORMAL LOW
LDL Cholesterol: 85
Triglycerides: 256 — ABNORMAL HIGH

## 2011-02-14 LAB — FLECAINIDE LEVEL
Flecainide: 1.17 ug/mL — ABNORMAL HIGH (ref 0.20–1.00)
Flecainide: 1.8 ug/mL — AB (ref 0.20–1.00)
Flecainide: 0.5 ug/mL (ref 0.20–1.00)
Flecainide: 0.78 ug/mL (ref 0.20–1.00)

## 2011-02-14 LAB — PROTIME-INR
INR: 1.3
Prothrombin Time: 16.3 — ABNORMAL HIGH
Prothrombin Time: 19.1 — ABNORMAL HIGH

## 2011-02-22 LAB — LIPID PANEL
Cholesterol: 158
HDL: 27 — ABNORMAL LOW
HDL: 27 — ABNORMAL LOW
LDL Cholesterol: 96
Total CHOL/HDL Ratio: 5.9
Triglycerides: 175 — ABNORMAL HIGH
VLDL: 35
VLDL: UNDETERMINED

## 2011-02-22 LAB — APTT
aPTT: 30
aPTT: 28
aPTT: 43 — ABNORMAL HIGH

## 2011-02-22 LAB — I-STAT 8, (EC8 V) (CONVERTED LAB)
BUN: 13
Bicarbonate: 26.5 — ABNORMAL HIGH
Glucose, Bld: 103 — ABNORMAL HIGH
HCT: 42
Operator id: 196461
pCO2, Ven: 36.9 — ABNORMAL LOW
pH, Ven: 7.464 — ABNORMAL HIGH

## 2011-02-22 LAB — MAGNESIUM
Magnesium: 1.7
Magnesium: 2

## 2011-02-22 LAB — COMPREHENSIVE METABOLIC PANEL
ALT: 60 — ABNORMAL HIGH
AST: 46 — ABNORMAL HIGH
CO2: 28
Chloride: 103
GFR calc Af Amer: >60
GFR calc non Af Amer: >60
Potassium: 3.8
Sodium: 139
Total Bilirubin: 0.6

## 2011-02-22 LAB — TSH
TSH: 3.036
TSH: 2.144

## 2011-02-22 LAB — PROTIME-INR
INR: 1
Prothrombin Time: 13.2
Prothrombin Time: 13.4
Prothrombin Time: 13.8

## 2011-02-22 LAB — CBC
HCT: 36.3 — ABNORMAL LOW
HCT: 40.3
HCT: 40.4
Hemoglobin: 12.4 — ABNORMAL LOW
Hemoglobin: 13.7
Hemoglobin: 13.9
MCHC: 34.1
MCHC: 34.2
MCHC: 34.9
MCV: 93.3
MCV: 94.3
MCV: 94.9
Platelets: 177
RBC: 4.28
RBC: 4.23
RBC: 4.85
RDW: 12.6
RDW: 12.7
WBC: 8.1
WBC: 8
WBC: 9.2

## 2011-02-22 LAB — CARDIAC PANEL(CRET KIN+CKTOT+MB+TROPI)
Relative Index: INVALID
Troponin I: 0.02

## 2011-02-22 LAB — CK TOTAL AND CKMB (NOT AT ARMC)
CK, MB: 2
CK, MB: 1.8
CK, MB: 2.1
Relative Index: INVALID
Relative Index: INVALID
Total CK: 36
Total CK: 52

## 2011-02-22 LAB — TROPONIN I
Troponin I: 0.02
Troponin I: 0.03
Troponin I: 0.03

## 2011-02-22 LAB — DIFFERENTIAL
Eosinophils Absolute: 0.2
Eosinophils Relative: 2
Lymphocytes Relative: 22
Lymphs Abs: 1.7
Monocytes Relative: 7

## 2011-02-22 LAB — BASIC METABOLIC PANEL
BUN: 11
CO2: 23
CO2: 29
CO2: 30
Calcium: 8.9
Calcium: 9.1
Chloride: 102
Chloride: 104
GFR calc Af Amer: >60
Glucose, Bld: 104 — ABNORMAL HIGH
Glucose, Bld: 143 — ABNORMAL HIGH
Potassium: 3.1 — ABNORMAL LOW
Sodium: 135
Sodium: 140
Sodium: 140

## 2011-02-22 LAB — HEPARIN LEVEL (UNFRACTIONATED): Heparin Unfractionated: 0.2 — ABNORMAL LOW

## 2011-02-22 LAB — POCT CARDIAC MARKERS
CKMB, poc: 1.3
Myoglobin, poc: 98
Operator id: 196461
Troponin i, poc: <0.05

## 2011-03-02 ENCOUNTER — Ambulatory Visit (INDEPENDENT_AMBULATORY_CARE_PROVIDER_SITE_OTHER): Payer: PRIVATE HEALTH INSURANCE | Admitting: *Deleted

## 2011-03-02 ENCOUNTER — Encounter: Payer: Self-pay | Admitting: Cardiovascular Disease

## 2011-03-02 ENCOUNTER — Encounter: Payer: PRIVATE HEALTH INSURANCE | Admitting: *Deleted

## 2011-03-02 DIAGNOSIS — I4891 Unspecified atrial fibrillation: Secondary | ICD-10-CM

## 2011-03-02 DIAGNOSIS — Z7901 Long term (current) use of anticoagulants: Secondary | ICD-10-CM

## 2011-03-02 LAB — POCT INR: INR: 1.9

## 2011-03-30 ENCOUNTER — Ambulatory Visit (INDEPENDENT_AMBULATORY_CARE_PROVIDER_SITE_OTHER): Payer: PRIVATE HEALTH INSURANCE | Admitting: *Deleted

## 2011-03-30 DIAGNOSIS — Z7901 Long term (current) use of anticoagulants: Secondary | ICD-10-CM

## 2011-03-30 DIAGNOSIS — I4891 Unspecified atrial fibrillation: Secondary | ICD-10-CM

## 2011-04-12 ENCOUNTER — Other Ambulatory Visit: Payer: Self-pay | Admitting: *Deleted

## 2011-04-12 MED ORDER — WARFARIN SODIUM 5 MG PO TABS: 5.0000 mg | ORAL_TABLET | ORAL | Status: DC

## 2011-04-15 ENCOUNTER — Telehealth: Payer: Self-pay | Admitting: Cardiology

## 2011-04-15 NOTE — Telephone Encounter (Signed)
New problem Pt said he has been having some afib over the last two days. Please call him back

## 2011-04-15 NOTE — Telephone Encounter (Signed)
Pt rechecked BP his BP and HR again 130/90 and HR staying between 110 - 120.  Pt has taken all medications today as ordered.  He will take extra 1/2 of his Inderal and check is Blood pressure and HR later this evening.  If it remains elevated he will call the MD on call this weekend.  Pt states understanding and was in agreement.

## 2011-04-15 NOTE — Telephone Encounter (Signed)
Fu call Pt said he got new BP machine  His pulse is 119 BP 158/105 He was told to call back

## 2011-04-15 NOTE — Telephone Encounter (Signed)
Pt with pt who states his HR was 130 yesterday but then his monitor broke and he has not been able to check it since.  He doesn't know how to check it himself.  He feels like it is slower now but has had it going up and down for the last couple of days.  Pt is going to obtain another monitor, check his vitals and call back if HR is elevated.  He is on coumadin therapy.

## 2011-04-15 NOTE — Telephone Encounter (Signed)
Pt states he woke up yesterday am with an irregular pulse and a rate of around 130.  Today, it seemed better in the am but is getting worse this afternoon.  He denies worsening sob or dizziness.  He had some pain yesterday in his left chest for 10 minutes.  Prior to the past month, he was getting it a couple times a week but it didn't last very long (10-30 minutes).

## 2011-04-18 ENCOUNTER — Ambulatory Visit (INDEPENDENT_AMBULATORY_CARE_PROVIDER_SITE_OTHER): Payer: PRIVATE HEALTH INSURANCE | Admitting: Physician Assistant

## 2011-04-18 ENCOUNTER — Ambulatory Visit (INDEPENDENT_AMBULATORY_CARE_PROVIDER_SITE_OTHER): Payer: PRIVATE HEALTH INSURANCE | Admitting: *Deleted

## 2011-04-18 ENCOUNTER — Telehealth: Payer: Self-pay | Admitting: Cardiology

## 2011-04-18 ENCOUNTER — Encounter: Payer: Self-pay | Admitting: Physician Assistant

## 2011-04-18 VITALS — BP 146/83 | HR 88 | Resp 20 | Ht 75.0 in | Wt 259.0 lb

## 2011-04-18 DIAGNOSIS — I4891 Unspecified atrial fibrillation: Secondary | ICD-10-CM

## 2011-04-18 DIAGNOSIS — Z720 Tobacco use: Secondary | ICD-10-CM

## 2011-04-18 DIAGNOSIS — F17200 Nicotine dependence, unspecified, uncomplicated: Secondary | ICD-10-CM

## 2011-04-18 DIAGNOSIS — K625 Hemorrhage of anus and rectum: Secondary | ICD-10-CM

## 2011-04-18 DIAGNOSIS — I10 Essential (primary) hypertension: Secondary | ICD-10-CM

## 2011-04-18 DIAGNOSIS — I4892 Unspecified atrial flutter: Secondary | ICD-10-CM

## 2011-04-18 DIAGNOSIS — G4730 Sleep apnea, unspecified: Secondary | ICD-10-CM

## 2011-04-18 DIAGNOSIS — Z7901 Long term (current) use of anticoagulants: Secondary | ICD-10-CM

## 2011-04-18 LAB — CBC WITH DIFFERENTIAL/PLATELET
Basophils Absolute: 0 10*3/uL (ref 0.0–0.1)
Eosinophils Absolute: 0.2 10*3/uL (ref 0.0–0.7)
Lymphocytes Relative: 27.4 % (ref 12.0–46.0)
MCHC: 34.6 g/dL (ref 30.0–36.0)
MCV: 94.4 fl (ref 78.0–100.0)
Monocytes Absolute: 0.7 10*3/uL (ref 0.1–1.0)
Neutrophils Relative %: 61.3 % (ref 43.0–77.0)
Platelets: 177 10*3/uL (ref 150.0–400.0)
RDW: 14.5 % (ref 11.5–14.6)

## 2011-04-18 LAB — BASIC METABOLIC PANEL
CO2: 29 mEq/L (ref 19–32)
Calcium: 9.5 mg/dL (ref 8.4–10.5)
Chloride: 103 mEq/L (ref 96–112)
Creatinine, Ser: 0.8 mg/dL (ref 0.4–1.5)
Glucose, Bld: 119 mg/dL — ABNORMAL HIGH (ref 70–99)

## 2011-04-18 LAB — POCT INR: INR: 1.9

## 2011-04-18 MED ORDER — PROPRANOLOL HCL 80 MG PO TABS: 120.0000 mg | ORAL_TABLET | Freq: Every day | ORAL | Status: DC

## 2011-04-18 MED ORDER — PROPRANOLOL HCL ER 120 MG PO CP24: 120.0000 mg | ORAL_CAPSULE | Freq: Every day | ORAL | Status: DC

## 2011-04-18 NOTE — Assessment & Plan Note (Signed)
I suspect he is describing hemorrhoidal bleeding.  Check a CBC today.  If Hgb significantly depressed, he will need urgent referral to GI.  Otherwise, attempt to restore NSR then refer to [facility] who will hopefully be able to work with him on cost of procedures to evaluate his rectal bleeding.

## 2011-04-18 NOTE — Telephone Encounter (Signed)
New Msg: Pt calling c/o heart being in a-fib. Pt wants to know if he can be seen sooner. Please return pt call to discuss further.

## 2011-04-18 NOTE — Assessment & Plan Note (Signed)
We discussed the importance of treating OSA to control arrhythmias and BP.  Will eventually need to try to get him in to see Dr. Shelle Iron or Dr. Craige Cotta to see if they can help get him on an affordable OSA regimen.

## 2011-04-18 NOTE — Assessment & Plan Note (Addendum)
Appears to be typical flutter.  Suspect we may be able to consider referral to EP for potential RFCA in the future.  INR today 1.9.  Symptoms started more that 72 hours ago.  Will try to get him ready for elective DCCV.  He will need 3-4 weeks of therapeutic INR prior to proceeding with DCCV.  I have notified the coumadin clinic and they will see him weekly.  Increase propranolol to 120 mg QD for better rate control.  Continue flecainide.  He has initial appt with Dr. Rollene Rotunda.  Keep that appt.  If INRs therapeutic x 2 weeks, should be able to schedule DCCV at that time.  Check TSH, BMET and CBC today.

## 2011-04-18 NOTE — Progress Notes (Signed)
[address] [place], [place]  [address] Phone: [phone] Fax:  [phone]  Date:  04/18/2011   Name:  COLT MARTELLE       DOB:  July 09, 1955 MRN:  [id number]  PCP:  Dr. Clarene Duke Primary Cardiologist:  Dr. Rollene Rotunda (previously seen by Dr. Kirke Corin in [place])   History of Present Illness: CHISTIAN KASLER is a 55 y.o. male who is added on to my schedule today for symptoms of Afib.  He was previously followed by Dr. Lorine Bears in [place].  He has a h/o parox AFib, previously maintaining NSR on Flecainide, HTN, HLP, COPD, DM2, sleep apnea and tobacco abuse.  He was previously taken off of coumadin for possible cerebral aneurysm.  However, follow up CT angio of the brain was neg for aneurysm in 11/09.  He was placed on coumadin by Dr. Lorine Bears after being dx with DM2 in 6/12.  He was doing well until late last week he started to feel like he was back in AFib.  He notes irregular heart beat as well as rapid beat.  He has had a couple of episodes of chest pain that were brief in nature.  One episode was with walking.  Notes increased fatigue.  Notes DOE worse recently.  Describes class 2b symptoms.  No orthopnea.  He does not wear CPAP due to cost of machine.  No syncope.  He does note one episode of near syncope with sudden standing.    Past Medical History  Diagnosis Date  . COPD (chronic obstructive pulmonary disease)   . HTN (hypertension)   . HLD (hyperlipidemia)   . Obesity   . Brain aneurysm 2009    questionable. A follow up CTA in 2009 showed no evidence of  . Dyspnea     LHC 11/08: EF 60%, normal coronary arteries;  ETT-echo 2/11: normal at 71% PMHR  . OSA (obstructive sleep apnea)   . PAF (paroxysmal atrial fibrillation)     echo 8/11: mild LVH, EF 60-65%, trivial MR, mod LAE, mild RAE, PASP 43-47, mild pulmo HTN  . Tobacco abuse   . Diabetes mellitus   . Overdose 2009    unintentional Flacanide overdose    Current Outpatient  Prescriptions  Medication Sig Dispense Refill  . flecainide (TAMBOCOR) 100 MG tablet Take 100 mg by mouth 2 (two) times daily.        . furosemide (LASIX) 40 MG tablet Take 40 mg by mouth daily as needed.        [name] glimepiride (AMARYL) 1 MG tablet Take 1 mg by mouth daily before breakfast.        . lisinopril-hydrochlorothiazide (PRINZIDE,ZESTORETIC) 20-25 MG per tablet Take 1 tablet by mouth daily.        . metFORMIN (GLUCOPHAGE) 500 MG tablet Take 500 mg by mouth 2 (two) times daily with a meal.        . Multiple Vitamin (MULTIVITAMIN) tablet Take 1 tablet by mouth daily.        . Omega-3 Fatty Acids (FISH OIL) 1200 MG CAPS Take by mouth daily.        [name] omeprazole (PRILOSEC) 20 MG capsule Take 20 mg by mouth 2 (two) times daily.        . potassium chloride (KLOR-CON) 10 MEQ CR tablet Take 10 mEq by mouth daily.        . propranolol (INDERAL) 80 MG tablet Take 80 mg by mouth daily.        [name]  simvastatin (ZOCOR) 20 MG tablet Take 20 mg by mouth at bedtime.        [name] warfarin (COUMADIN) 5 MG tablet Take 1 tablet (5 mg total) by mouth as directed.  45 tablet  3  . albuterol (VENTOLIN HFA) 108 (90 BASE) MCG/ACT inhaler Inhale 2 puffs into the lungs every 6 (six) hours as needed.        [name] aspirin 325 MG tablet Take 325 mg by mouth daily.        [name] b complex vitamins tablet Take 1 tablet by mouth daily.        . budesonide-formoterol (SYMBICORT) 160-4.5 MCG/ACT inhaler Inhale 2 puffs into the lungs 2 (two) times daily.        . sildenafil (VIAGRA) 100 MG tablet Take 100 mg by mouth daily as needed.        . temazepam (RESTORIL) 30 MG capsule Take 30 mg by mouth at bedtime as needed.          Allergies: No Known Allergies   History  Substance Use Topics  . Smoking status: Current Everyday Smoker -- 2.0 packs/day  . Smokeless tobacco: Not on file  . Alcohol Use: Yes     ROS:  Please see the history of present illness.   Endocrine ROS: positive for - diaphoresis.  Gastrointestinal ROS: positive  for - rectal bleeding for 6-8 mos (no change since starting coumadin); he was unable to afford appt with [facility] for evaluation;   negative for - melena, nausea/vomiting or weight loss.  All other systems reviewed and negative.   PHYSICAL EXAM: Vital Signs:  BP 146/83  Pulse 88  Resp 20  Ht 6\' 3"  (1.905 m)  Wt 259 lb (117.482 kg)  BMI 32.37 kg/m2 Well nourished, well developed, in no acute distress HEENT: normal Neck: no JVD Endo:  No thyromegaly Cardiac:  normal S1, S2; rapid RRR; no murmur Lungs:  Decreased breath sounds bilaterally, no wheezing, rhonchi or rales Abd: soft, nontender, no hepatomegaly Ext: no edema Skin: warm and dry Neuro:  CNs 2-12 intact, no focal abnormalities noted Psych: normal affect  EKG:   AFlutter, HR 95, normal axis, PRWP, NSSTTW changes  ASSESSMENT AND PLAN:

## 2011-04-18 NOTE — Assessment & Plan Note (Signed)
Elevated today.  Adjust propranolol as noted.

## 2011-04-18 NOTE — Patient Instructions (Addendum)
Your physician recommends that you schedule a follow-up appointment in: 05/03/11 @ 9:30 WITH DR. [facility]  Your physician has recommended you make the following change in your medication: INCREASE PROPRANOLOL ER 120 MG DAILY, RX SENT IN TODAY  Your physician recommends that you return for lab work in: BMET, CBC W/DIFF, TSH 427.32

## 2011-04-18 NOTE — Telephone Encounter (Signed)
Per pt - has been in At Fib since last Thursday and his HR is staying between 110 and 120.  He doesn't feel well and is getting worried.  He will be added onto [employer] schedule today for evaluation.

## 2011-04-18 NOTE — Assessment & Plan Note (Signed)
We discussed the importance of quitting and I set a quit date of 05/17/11 for him and his wife.

## 2011-04-26 ENCOUNTER — Ambulatory Visit (INDEPENDENT_AMBULATORY_CARE_PROVIDER_SITE_OTHER): Payer: PRIVATE HEALTH INSURANCE | Admitting: *Deleted

## 2011-04-26 DIAGNOSIS — I4891 Unspecified atrial fibrillation: Secondary | ICD-10-CM

## 2011-04-26 DIAGNOSIS — Z7901 Long term (current) use of anticoagulants: Secondary | ICD-10-CM

## 2011-04-26 LAB — POCT INR: INR: 1.8

## 2011-05-03 ENCOUNTER — Encounter: Payer: Self-pay | Admitting: *Deleted

## 2011-05-03 ENCOUNTER — Ambulatory Visit (INDEPENDENT_AMBULATORY_CARE_PROVIDER_SITE_OTHER): Payer: PRIVATE HEALTH INSURANCE | Admitting: *Deleted

## 2011-05-03 ENCOUNTER — Encounter: Payer: Self-pay | Admitting: Cardiology

## 2011-05-03 ENCOUNTER — Encounter: Payer: PRIVATE HEALTH INSURANCE | Admitting: *Deleted

## 2011-05-03 ENCOUNTER — Ambulatory Visit (INDEPENDENT_AMBULATORY_CARE_PROVIDER_SITE_OTHER): Payer: PRIVATE HEALTH INSURANCE | Admitting: Cardiology

## 2011-05-03 VITALS — BP 136/70 | HR 122 | Ht 75.0 in | Wt 255.0 lb

## 2011-05-03 DIAGNOSIS — K625 Hemorrhage of anus and rectum: Secondary | ICD-10-CM

## 2011-05-03 DIAGNOSIS — I4892 Unspecified atrial flutter: Secondary | ICD-10-CM

## 2011-05-03 DIAGNOSIS — I4891 Unspecified atrial fibrillation: Secondary | ICD-10-CM

## 2011-05-03 DIAGNOSIS — I10 Essential (primary) hypertension: Secondary | ICD-10-CM

## 2011-05-03 DIAGNOSIS — Z720 Tobacco use: Secondary | ICD-10-CM

## 2011-05-03 DIAGNOSIS — Z7901 Long term (current) use of anticoagulants: Secondary | ICD-10-CM

## 2011-05-03 DIAGNOSIS — F17200 Nicotine dependence, unspecified, uncomplicated: Secondary | ICD-10-CM

## 2011-05-03 LAB — POCT INR: INR: 2

## 2011-05-03 NOTE — Assessment & Plan Note (Signed)
This has been sporadic over the years. His last CBC demonstrated no significant anemia. He is tolerating the blood thinners.

## 2011-05-03 NOTE — Assessment & Plan Note (Signed)
The patient remains in atrial flutter. He's had paroxysms of atrial fibrillation treated with flecainide and documented by Dr. Kirke Corin in the past.  Unfortunately he's had some trouble getting his Coumadin therapeutic. He symptomatic with the tachycardia palpitations. He does have a level above 2 today and his dose was increased today. He needs TEE guided cardioversion for symptomatic flutter. We can also consider EP ablation if he has any recurrence in the future and continue to manage his fibrillation with flecainide.

## 2011-05-03 NOTE — Progress Notes (Signed)
 HPI The patient presents for followup of atrial flutter. He has had atrial fibrillation treated with flecainide in the past. However, most recently when he had tachycardia palpitations he was found to be flutter. He's in flutter again today. He is awaiting cardioversion. However, his INR is until today have not been therapeutic. He was to today and he'll have his dose increased. He notices the palpitations. He feels a little weaker and more winded with this. He's not had any presyncope or syncope. He denies any chest pressure, neck or arm discomfort. He's had no new PND or orthopnea. He has had no new weight gain or edema.  Allergies  Allergen Reactions  . Latex     Current Outpatient Prescriptions  Medication Sig Dispense Refill  . albuterol (VENTOLIN HFA) 108 (90 BASE) MCG/ACT inhaler Inhale 2 puffs into the lungs every 6 (six) hours as needed.        . b complex vitamins tablet Take 1 tablet by mouth daily.        . flecainide (TAMBOCOR) 100 MG tablet Take 100 mg by mouth 2 (two) times daily.        . furosemide (LASIX) 40 MG tablet Take 40 mg by mouth daily as needed.        . glimepiride (AMARYL) 1 MG tablet Take 1 mg by mouth daily before breakfast.        . lisinopril-hydrochlorothiazide (PRINZIDE,ZESTORETIC) 20-25 MG per tablet Take 1 tablet by mouth daily.        . metFORMIN (GLUCOPHAGE) 500 MG tablet Take 500 mg by mouth daily with breakfast.       . Multiple Vitamin (MULTIVITAMIN) tablet Take 1 tablet by mouth daily.        . Omega-3 Fatty Acids (FISH OIL) 1200 MG CAPS Take by mouth daily.        . omeprazole (PRILOSEC) 20 MG capsule Take 20 mg by mouth 2 (two) times daily.        . potassium chloride (KLOR-CON) 10 MEQ CR tablet Take 10 mEq by mouth daily.        . propranolol (INDERAL LA) 120 MG 24 hr capsule Take 1 capsule (120 mg total) by mouth daily.  30 capsule  11  . sildenafil (VIAGRA) 100 MG tablet Take 100 mg by mouth daily as needed.        . simvastatin (ZOCOR) 20 MG  tablet Take 20 mg by mouth at bedtime.        . temazepam (RESTORIL) 30 MG capsule Take 30 mg by mouth at bedtime as needed.        . warfarin (COUMADIN) 5 MG tablet Take 1 tablet (5 mg total) by mouth as directed.  45 tablet  3  . budesonide-formoterol (SYMBICORT) 160-4.5 MCG/ACT inhaler Inhale 2 puffs into the lungs 2 (two) times daily.          Past Medical History  Diagnosis Date  . COPD (chronic obstructive pulmonary disease)   . HTN (hypertension)   . HLD (hyperlipidemia)   . Obesity   . Brain aneurysm 2009    questionable. A follow up CTA in 2009 showed no evidence of  . Dyspnea     LHC 11/08: EF 60%, normal coronary arteries;  ETT-echo 2/11: normal at 71% PMHR  . OSA (obstructive sleep apnea)   . PAF (paroxysmal atrial fibrillation)     echo 8/11: mild LVH, EF 60-65%, trivial MR, mod LAE, mild RAE, PASP 43-47, mild pulmo HTN  .   Tobacco abuse   . Diabetes mellitus   . Overdose 2009    unintentional Flacanide overdose    Past Surgical History  Procedure Date  . Appendectomy   . Cardiac catheterization 2008    no significant CAD  . Neck surgery   . Right shoulder surgery   . Right knee surgery   . Tonsilectomy, adenoidectomy, bilateral myringotomy and tubes   . Left inguinal hernia repair     ROS:  As stated in the HPI and negative for all other systems.  PHYSICAL EXAM BP 136/70  Pulse 122  Ht 6' 3" (1.905 m)  Wt 255 lb (115.667 kg)  BMI 31.87 kg/m2 GENERAL:  Well appearing HEENT:  Pupils equal round and reactive, fundi not visualized, oral mucosa , edentulous, ruddy appearance NECK:  No jugular venous distention, waveform within normal limits, carotid upstroke brisk and symmetric, no bruits, no thyromegaly LYMPHATICS:  No cervical, inguinal adenopathy LUNGS:  Clear to auscultation bilaterally, diminished BS. BACK:  No CVA tenderness CHEST:  Unremarkable HEART:  PMI not displaced or sustained,S1 and S2 within normal limits, no S3, no S4, no clicks, no rubs, no  murmurs, irregular ABD:  Flat, positive bowel sounds normal in frequency in pitch, no bruits, no rebound, no guarding, no midline pulsatile mass, no hepatomegaly, no splenomegaly EXT:  2 plus pulses throughout, no edema, no cyanosis no clubbing SKIN:  No rashes no nodules NEURO:  Cranial nerves II through XII grossly intact, motor grossly intact throughout PSYCH:  Cognitively intact, oriented to person place and time  Lab Results  Component Value Date   INR 2.0 05/03/2011   INR 1.8 04/26/2011   INR 1.9 04/18/2011   Lab Results  Component Value Date   TSH 1.69 04/18/2011   EKG:  Atrial flutter with variable conduction, typical flutter waves.  05/03/2011   ASSESSMENT AND PLAN  

## 2011-05-03 NOTE — Assessment & Plan Note (Signed)
The blood pressure is at target. No change in medications is indicated. We will continue with therapeutic lifestyle changes (TLC).  

## 2011-05-03 NOTE — Patient Instructions (Signed)
The current medical regimen is effective;  continue present plan and medications.  Your physician has requested that you have a TEE/Cardioversion. During a TEE, sound waves are used to create images of your heart. It provides your doctor with information about the size and shape of your heart and how well your heart's chambers and valves are working. In this test, a transducer is attached to the end of a flexible tube that is guided down you throat and into your esophagus (the tube leading from your mouth to your stomach) to get a more detailed image of your heart. Once the TEE has determined that a blood clot is not present, the cardioversion begins. Electrical Cardioversion uses a jolt of electricity to your heart either through paddles or wired patches attached to your chest. This is a controlled, usually prescheduled, procedure. This procedure is done at the hospital and you are not awake during the procedure. You usually go home the day of the procedure. Please see the instruction sheet given to you today for more information.  Follow up after procedure.

## 2011-05-03 NOTE — Patient Instructions (Signed)
Pending cardioversin. Discussed leafy greens vegetable intake and moderation. Discussed med interactions.

## 2011-05-03 NOTE — Assessment & Plan Note (Signed)
We discussed a specific strategy for tobacco cessation.  (Greater than three minutes discussing tobacco cessation.)  

## 2011-05-05 ENCOUNTER — Encounter (HOSPITAL_COMMUNITY): Payer: Self-pay | Admitting: *Deleted

## 2011-05-05 ENCOUNTER — Ambulatory Visit (HOSPITAL_COMMUNITY): Payer: PRIVATE HEALTH INSURANCE | Admitting: *Deleted

## 2011-05-05 ENCOUNTER — Encounter (HOSPITAL_COMMUNITY)
Admission: RE | Disposition: A | Discharge status: Home / Self Care | Payer: Self-pay | Source: Ambulatory Visit | Attending: Cardiology

## 2011-05-05 ENCOUNTER — Ambulatory Visit (HOSPITAL_COMMUNITY)
Admission: RE | Admit: 2011-05-05 | Discharge: 2011-05-05 | Disposition: A | Discharge status: Home / Self Care | Payer: PRIVATE HEALTH INSURANCE | Source: Ambulatory Visit | Attending: Cardiology | Admitting: Cardiology

## 2011-05-05 ENCOUNTER — Other Ambulatory Visit: Payer: Self-pay | Admitting: Pharmacist

## 2011-05-05 ENCOUNTER — Other Ambulatory Visit: Payer: Self-pay

## 2011-05-05 DIAGNOSIS — E119 Type 2 diabetes mellitus without complications: Secondary | ICD-10-CM | POA: Insufficient documentation

## 2011-05-05 DIAGNOSIS — I4892 Unspecified atrial flutter: Secondary | ICD-10-CM

## 2011-05-05 DIAGNOSIS — J4489 Other specified chronic obstructive pulmonary disease: Secondary | ICD-10-CM | POA: Insufficient documentation

## 2011-05-05 DIAGNOSIS — I4891 Unspecified atrial fibrillation: Secondary | ICD-10-CM

## 2011-05-05 DIAGNOSIS — Z01810 Encounter for preprocedural cardiovascular examination: Secondary | ICD-10-CM | POA: Insufficient documentation

## 2011-05-05 DIAGNOSIS — K08109 Complete loss of teeth, unspecified cause, unspecified class: Secondary | ICD-10-CM | POA: Insufficient documentation

## 2011-05-05 DIAGNOSIS — I10 Essential (primary) hypertension: Secondary | ICD-10-CM | POA: Insufficient documentation

## 2011-05-05 DIAGNOSIS — J449 Chronic obstructive pulmonary disease, unspecified: Secondary | ICD-10-CM | POA: Insufficient documentation

## 2011-05-05 DIAGNOSIS — K219 Gastro-esophageal reflux disease without esophagitis: Secondary | ICD-10-CM | POA: Insufficient documentation

## 2011-05-05 DIAGNOSIS — G4730 Sleep apnea, unspecified: Secondary | ICD-10-CM | POA: Insufficient documentation

## 2011-05-05 HISTORY — PX: CARDIOVERSION: SHX1299

## 2011-05-05 HISTORY — DX: Gastro-esophageal reflux disease without esophagitis: K21.9

## 2011-05-05 HISTORY — PX: TEE WITHOUT CARDIOVERSION: SHX5443

## 2011-05-05 LAB — BASIC METABOLIC PANEL
BUN: 14 mg/dL (ref 6–23)
Chloride: 101 mEq/L (ref 96–112)
Creatinine, Ser: 0.68 mg/dL (ref 0.50–1.35)
GFR calc Af Amer: >90 mL/min (ref >90)
GFR calc non Af Amer: >90 mL/min (ref >90)

## 2011-05-05 SURGERY — ECHOCARDIOGRAM, TRANSESOPHAGEAL
Anesthesia: Moderate Sedation

## 2011-05-05 SURGERY — CARDIOVERSION
Anesthesia: Monitor Anesthesia Care

## 2011-05-05 MED ORDER — FENTANYL CITRATE 0.05 MG/ML IJ SOLN
INTRAMUSCULAR | Status: AC
  Filled 2011-05-05: qty 2

## 2011-05-05 MED ORDER — SODIUM CHLORIDE 0.9 % IJ SOLN: 3.0000 mL | INTRAMUSCULAR | Status: DC | PRN

## 2011-05-05 MED ORDER — MIDAZOLAM HCL 10 MG/2ML IJ SOLN
INTRAMUSCULAR | Status: DC | PRN
  Administered 2011-05-05 (×2): 2 mg via INTRAVENOUS

## 2011-05-05 MED ORDER — MIDAZOLAM HCL 10 MG/2ML IJ SOLN: 10.0000 mg | Freq: Once | INTRAMUSCULAR | Status: DC

## 2011-05-05 MED ORDER — FENTANYL CITRATE 0.05 MG/ML IJ SOLN: 250.0000 ug | Freq: Once | INTRAMUSCULAR | Status: DC

## 2011-05-05 MED ORDER — MIDAZOLAM HCL 10 MG/2ML IJ SOLN
INTRAMUSCULAR | Status: AC
  Filled 2011-05-05: qty 2

## 2011-05-05 MED ORDER — SODIUM CHLORIDE 0.9 % IV SOLN
INTRAVENOUS | Status: DC | PRN
  Administered 2011-05-05: 11:00:00 via INTRAVENOUS

## 2011-05-05 MED ORDER — FENTANYL CITRATE 0.05 MG/ML IJ SOLN
INTRAMUSCULAR | Status: DC | PRN
  Administered 2011-05-05: 25 ug via INTRAVENOUS

## 2011-05-05 MED ORDER — BUTAMBEN-TETRACAINE-BENZOCAINE 2-2-14 % EX AERO
INHALATION_SPRAY | CUTANEOUS | Status: DC | PRN
  Administered 2011-05-05: 2 via TOPICAL

## 2011-05-05 MED ORDER — ENOXAPARIN SODIUM 120 MG/0.8ML ~~LOC~~ SOLN: 120.0000 mg | Freq: Two times a day (BID) | SUBCUTANEOUS | Status: DC

## 2011-05-05 MED ORDER — SODIUM CHLORIDE 0.9 % IJ SOLN: 3.0000 mL | Freq: Two times a day (BID) | INTRAMUSCULAR | Status: DC

## 2011-05-05 MED ORDER — ENOXAPARIN SODIUM 120 MG/0.8ML ~~LOC~~ SOLN
120.0000 mg | SUBCUTANEOUS | Status: AC
  Administered 2011-05-05: 120 mg via SUBCUTANEOUS
  Filled 2011-05-05: qty 0.8

## 2011-05-05 MED ORDER — SODIUM CHLORIDE 0.45 % IV SOLN
INTRAVENOUS | Status: DC
  Administered 2011-05-05: 500 mL via INTRAVENOUS

## 2011-05-05 MED ORDER — ENOXAPARIN SODIUM 150 MG/ML ~~LOC~~ SOLN: 1.0000 mg/kg | Freq: Two times a day (BID) | SUBCUTANEOUS | Status: DC

## 2011-05-05 MED ORDER — PROPOFOL 10 MG/ML IV EMUL
INTRAVENOUS | Status: DC | PRN
  Administered 2011-05-05: 100 mg via INTRAVENOUS

## 2011-05-05 MED ORDER — BENZOCAINE 20 % MT SOLN
1.0000 "application " | OROMUCOSAL | Status: DC | PRN
  Filled 2011-05-05: qty 57

## 2011-05-05 NOTE — Preoperative (Signed)
Beta Blockers   Reason not to administer Beta Blockers:Not Applicable 

## 2011-05-05 NOTE — Procedures (Signed)
   See TEE report in [employer].   TEE shows no LAA thrombus; patient sedated by anesthesia with diprovan 100 mg IV; Synchronized DCCV with 120 j resulted in sinus rhythm; no immediate complications.     Patient's INR 1.97. Recent Hgb and renal function normal. Patient given lovenox 120 mg sq; he will obtain second dose from his pharmacy (ordered by [facility]); he will give himself that dose at 11:00 PM tonight. He will have his INR checked in [place] office at 11:00 AM tomorrow. If INR < 2, continue lovenox. If therapeutic DC lovenox. Continue coumadin  [name]

## 2011-05-05 NOTE — Anesthesia Preprocedure Evaluation (Addendum)
Anesthesia Evaluation  Patient identified by MRN, date of birth, ID band  Reviewed: Allergy & Precautions, H&P , NPO status , Patient's Chart, lab work & pertinent test results  Airway Mallampati: II TM Distance: >3 FB Neck ROM: Full    Dental  (+) Edentulous Upper and Edentulous Lower   Pulmonary shortness of breath and with exertion, sleep apnea , COPD COPD inhaler,          Cardiovascular hypertension, + dysrhythmias     Neuro/Psych    GI/Hepatic GERD-  Controlled and Medicated,  Endo/Other  Diabetes mellitus-  Renal/GU      Musculoskeletal   Abdominal   Peds  Hematology   Anesthesia Other Findings   Reproductive/Obstetrics                         Anesthesia Physical Anesthesia Plan  ASA: II  Anesthesia Plan: MAC   Post-op Pain Management:    Induction: Intravenous  Airway Management Planned: Mask  Additional Equipment:   Intra-op Plan:   Post-operative Plan:   Informed Consent:   Plan Discussed with: Anesthesiologist  Anesthesia Plan Comments:        Anesthesia Quick Evaluation

## 2011-05-05 NOTE — Discharge Summary (Signed)
Pt and family given hand written discharge instructions due to inability to print d/c instructions from computer.  Copy in chart.  Delorse Lek, RN

## 2011-05-05 NOTE — Anesthesia Postprocedure Evaluation (Signed)
  Anesthesia Post-op Note  Patient: [name]  Procedure(s) Performed:  TRANSESOPHAGEAL ECHOCARDIOGRAM (TEE); CARDIOVERSION  Patient Location: PACU  Anesthesia Type: MAC  Level of Consciousness: awake, alert  and patient cooperative  Airway and Oxygen Therapy: Patient Spontanous Breathing and Patient connected to nasal cannula oxygen  Post-op Pain: none  Post-op Assessment: Post-op Vital signs reviewed, Patient's Cardiovascular Status Stable, Respiratory Function Stable, Patent Airway and No signs of Nausea or vomiting  Post-op Vital Signs: Reviewed and stable  Complications: No apparent anesthesia complications

## 2011-05-05 NOTE — Interval H&P Note (Signed)
History and Physical Interval Note:  05/05/2011 10:22 AM  [name]  has presented today for surgery, with the diagnosis of aflutter.  The various methods of treatment have been discussed with the patient and family. After consideration of risks, benefits and other options for treatment, the patient has consented to  Procedure(s): TRANSESOPHAGEAL ECHOCARDIOGRAM (TEE) CARDIOVERSION as a surgical intervention .  The patients' history has been reviewed, patient examined, no change in status, stable for surgery.  I have reviewed the patients' chart and labs.  Questions were answered to the patient's satisfaction.    H and P reviewed; no updates Olga Millers

## 2011-05-05 NOTE — Transfer of Care (Signed)
Immediate Anesthesia Transfer of Care Note  Patient: [name]  Procedure(s) Performed:  TRANSESOPHAGEAL ECHOCARDIOGRAM (TEE); CARDIOVERSION  Patient Location: PACU  Anesthesia Type: MAC  Level of Consciousness: awake, alert  and patient cooperative  Airway & Oxygen Therapy: Patient Spontanous Breathing and Patient connected to nasal cannula oxygen  Post-op Assessment: Report given to PACU RN, Post -op Vital signs reviewed and stable and Patient moving all extremities X 4  Post vital signs: Reviewed and stable  Complications: No apparent anesthesia complications

## 2011-05-05 NOTE — Anesthesia Postprocedure Evaluation (Signed)
  Anesthesia Post-op Note  Patient: [name]  Procedure(s) Performed:  TRANSESOPHAGEAL ECHOCARDIOGRAM (TEE); CARDIOVERSION  Patient Location: PACU and Short Stay  Anesthesia Type: General  Level of Consciousness: awake and alert   Airway and Oxygen Therapy: Patient Spontanous Breathing and Patient connected to nasal cannula oxygen  Post-op Pain: none  Post-op Assessment: Post-op Vital signs reviewed, Patient's Cardiovascular Status Stable, Respiratory Function Stable, Patent Airway, No signs of Nausea or vomiting and Pain level controlled  Post-op Vital Signs: Reviewed and stable  Complications: No apparent anesthesia complications

## 2011-05-05 NOTE — Progress Notes (Signed)
  Echocardiogram Echocardiogram Transesophageal has been performed.  [name], Real Cons 05/05/2011, 11:47 AM

## 2011-05-05 NOTE — Anesthesia Procedure Notes (Signed)
Procedure Name: MAC Date/Time: 05/05/2011 11:10 AM Performed by: Malachi Pro Pre-anesthesia Checklist: Patient identified, Emergency Drugs available, Patient being monitored, Timeout performed and Suction available Patient Re-evaluated:Patient Re-evaluated prior to inductionPreoxygenation: Pre-oxygenation with 100% oxygen Intubation Type: IV induction

## 2011-05-05 NOTE — H&P (View-Only) (Signed)
HPI The patient presents for followup of atrial flutter. He has had atrial fibrillation treated with flecainide in the past. However, most recently when he had tachycardia palpitations he was found to be flutter. He's in flutter again today. He is awaiting cardioversion. However, his INR is until today have not been therapeutic. He was to today and he'll have his dose increased. He notices the palpitations. He feels a little weaker and more winded with this. He's not had any presyncope or syncope. He denies any chest pressure, neck or arm discomfort. He's had no new PND or orthopnea. He has had no new weight gain or edema.  Allergies  Allergen Reactions  . Latex     Current Outpatient Prescriptions  Medication Sig Dispense Refill  . albuterol (VENTOLIN HFA) 108 (90 BASE) MCG/ACT inhaler Inhale 2 puffs into the lungs every 6 (six) hours as needed.        [name] b complex vitamins tablet Take 1 tablet by mouth daily.        . flecainide (TAMBOCOR) 100 MG tablet Take 100 mg by mouth 2 (two) times daily.        . furosemide (LASIX) 40 MG tablet Take 40 mg by mouth daily as needed.        [name] glimepiride (AMARYL) 1 MG tablet Take 1 mg by mouth daily before breakfast.        . lisinopril-hydrochlorothiazide (PRINZIDE,ZESTORETIC) 20-25 MG per tablet Take 1 tablet by mouth daily.        . metFORMIN (GLUCOPHAGE) 500 MG tablet Take 500 mg by mouth daily with breakfast.       . Multiple Vitamin (MULTIVITAMIN) tablet Take 1 tablet by mouth daily.        . Omega-3 Fatty Acids (FISH OIL) 1200 MG CAPS Take by mouth daily.        [name] omeprazole (PRILOSEC) 20 MG capsule Take 20 mg by mouth 2 (two) times daily.        . potassium chloride (KLOR-CON) 10 MEQ CR tablet Take 10 mEq by mouth daily.        . propranolol (INDERAL LA) 120 MG 24 hr capsule Take 1 capsule (120 mg total) by mouth daily.  30 capsule  11  . sildenafil (VIAGRA) 100 MG tablet Take 100 mg by mouth daily as needed.        . simvastatin (ZOCOR) 20 MG  tablet Take 20 mg by mouth at bedtime.        . temazepam (RESTORIL) 30 MG capsule Take 30 mg by mouth at bedtime as needed.        . warfarin (COUMADIN) 5 MG tablet Take 1 tablet (5 mg total) by mouth as directed.  45 tablet  3  . budesonide-formoterol (SYMBICORT) 160-4.5 MCG/ACT inhaler Inhale 2 puffs into the lungs 2 (two) times daily.          Past Medical History  Diagnosis Date  . COPD (chronic obstructive pulmonary disease)   . HTN (hypertension)   . HLD (hyperlipidemia)   . Obesity   . Brain aneurysm 2009    questionable. A follow up CTA in 2009 showed no evidence of  . Dyspnea     LHC 11/08: EF 60%, normal coronary arteries;  ETT-echo 2/11: normal at 71% PMHR  . OSA (obstructive sleep apnea)   . PAF (paroxysmal atrial fibrillation)     echo 8/11: mild LVH, EF 60-65%, trivial MR, mod LAE, mild RAE, PASP 43-47, mild pulmo HTN  .  Tobacco abuse   . Diabetes mellitus   . Overdose 2009    unintentional Flacanide overdose    Past Surgical History  Procedure Date  . Appendectomy   . Cardiac catheterization 2008    no significant CAD  . Neck surgery   . Right shoulder surgery   . Right knee surgery   . Tonsilectomy, adenoidectomy, bilateral myringotomy and tubes   . Left inguinal hernia repair     ROS:  As stated in the HPI and negative for all other systems.  PHYSICAL EXAM BP 136/70  Pulse 122  Ht 6\' 3"  (1.905 m)  Wt 255 lb (115.667 kg)  BMI 31.87 kg/m2 GENERAL:  Well appearing HEENT:  Pupils equal round and reactive, fundi not visualized, oral mucosa , edentulous, ruddy appearance NECK:  No jugular venous distention, waveform within normal limits, carotid upstroke brisk and symmetric, no bruits, no thyromegaly LYMPHATICS:  No cervical, inguinal adenopathy LUNGS:  Clear to auscultation bilaterally, diminished BS. BACK:  No CVA tenderness CHEST:  Unremarkable HEART:  PMI not displaced or sustained,S1 and S2 within normal limits, no S3, no S4, no clicks, no rubs, no  murmurs, irregular ABD:  Flat, positive bowel sounds normal in frequency in pitch, no bruits, no rebound, no guarding, no midline pulsatile mass, no hepatomegaly, no splenomegaly EXT:  2 plus pulses throughout, no edema, no cyanosis no clubbing SKIN:  No rashes no nodules NEURO:  Cranial nerves II through XII grossly intact, motor grossly intact throughout PSYCH:  Cognitively intact, oriented to person place and time  Lab Results  Component Value Date   INR 2.0 05/03/2011   INR 1.8 04/26/2011   INR 1.9 04/18/2011   Lab Results  Component Value Date   TSH 1.69 04/18/2011   EKG:  Atrial flutter with variable conduction, typical flutter waves.  05/03/2011   ASSESSMENT AND PLAN

## 2011-05-05 NOTE — Brief Op Note (Signed)
See procedure note [name]

## 2011-05-06 ENCOUNTER — Ambulatory Visit (INDEPENDENT_AMBULATORY_CARE_PROVIDER_SITE_OTHER): Payer: PRIVATE HEALTH INSURANCE | Admitting: *Deleted

## 2011-05-06 ENCOUNTER — Encounter (HOSPITAL_COMMUNITY): Payer: Self-pay | Admitting: Cardiology

## 2011-05-06 DIAGNOSIS — Z7901 Long term (current) use of anticoagulants: Secondary | ICD-10-CM

## 2011-05-06 DIAGNOSIS — I4891 Unspecified atrial fibrillation: Secondary | ICD-10-CM

## 2011-05-11 ENCOUNTER — Telehealth: Payer: Self-pay | Admitting: Cardiology

## 2011-05-11 ENCOUNTER — Encounter: Payer: PRIVATE HEALTH INSURANCE | Admitting: *Deleted

## 2011-05-11 NOTE — Telephone Encounter (Signed)
[name] states he has no energy since he had a TEE cardioversion last Thursday.  He states it hasn't gotten any better.  He is also states his heart "jumps around briefly".  BP has been running lower also (110/70).  He also noticed that while talking on Friday and Saturday the corner of his mouth pulled upwards.  It lasted for 90 minutes on Fri and 10-15 min on Sat.  He states his propranolol was increased from 80mg  to 120mg  on 04/18/11.

## 2011-05-11 NOTE — Telephone Encounter (Signed)
New Problem:    Patient called because he had a procedure on 05/05/11 and he has been feeling lethargic and feels exhausted easily ever since.  Also had on of his medications changed, didn't remember which one exactly, and believe that may be the issue as well.

## 2011-05-11 NOTE — Telephone Encounter (Signed)
Pt to come in to see Dr Antoine Poche on Friday per Dr Excell Seltzer.  Pt agreed.

## 2011-05-13 ENCOUNTER — Ambulatory Visit (INDEPENDENT_AMBULATORY_CARE_PROVIDER_SITE_OTHER): Payer: PRIVATE HEALTH INSURANCE | Admitting: Cardiology

## 2011-05-13 ENCOUNTER — Encounter: Payer: Self-pay | Admitting: Cardiology

## 2011-05-13 DIAGNOSIS — R531 Weakness: Secondary | ICD-10-CM | POA: Insufficient documentation

## 2011-05-13 DIAGNOSIS — Z720 Tobacco use: Secondary | ICD-10-CM

## 2011-05-13 DIAGNOSIS — G4730 Sleep apnea, unspecified: Secondary | ICD-10-CM

## 2011-05-13 DIAGNOSIS — R5383 Other fatigue: Secondary | ICD-10-CM | POA: Insufficient documentation

## 2011-05-13 DIAGNOSIS — I10 Essential (primary) hypertension: Secondary | ICD-10-CM

## 2011-05-13 DIAGNOSIS — F17200 Nicotine dependence, unspecified, uncomplicated: Secondary | ICD-10-CM

## 2011-05-13 DIAGNOSIS — I4891 Unspecified atrial fibrillation: Secondary | ICD-10-CM

## 2011-05-13 DIAGNOSIS — R5381 Other malaise: Secondary | ICD-10-CM

## 2011-05-13 LAB — HEPATIC FUNCTION PANEL
Albumin: 4.2 g/dL (ref 3.5–5.2)
Alkaline Phosphatase: 105 U/L (ref 39–117)
Total Bilirubin: 0.9 mg/dL (ref 0.3–1.2)

## 2011-05-13 LAB — BASIC METABOLIC PANEL
CO2: 27 mEq/L (ref 19–32)
Calcium: 8.8 mg/dL (ref 8.4–10.5)
Glucose, Bld: 123 mg/dL — ABNORMAL HIGH (ref 70–99)
Sodium: 139 mEq/L (ref 135–145)

## 2011-05-13 MED ORDER — PROPRANOLOL HCL ER 80 MG PO CP24: 80.0000 mg | ORAL_CAPSULE | Freq: Every day | ORAL | Status: DC

## 2011-05-13 NOTE — Assessment & Plan Note (Signed)
This certainly could be a significant contributor to his fatigue. I'm going to set him up to see one of our sleep experts.

## 2011-05-13 NOTE — Progress Notes (Signed)
HPI The patient presents for followup of atrial flutter. He has had atrial fibrillation treated with flecainide in the past. He had TEE/DCCV.  He is maintaining sinus rhythm. He remains on anticoagulation. Unfortunately he's not feeling any better. He's not noticing his heart beating as quickly accept he's had a few "flutters". However, he is very fatigued. He says he just doesn't feel good. He has vague symptoms of muscle aches. He's had lightheadedness but is not describing any orthostasis or syncope. He's not describing any chest pressure, neck or arm discomfort. He's not describing any visual voice or motor deficits. He is trying to cut back on his cigarettes. Of note he apparently does have severe sleep apnea but couldn't afford CPAP.  Allergies  Allergen Reactions  . Latex     Current Outpatient Prescriptions  Medication Sig Dispense Refill  . b complex vitamins tablet Take 1 tablet by mouth daily.        . flecainide (TAMBOCOR) 100 MG tablet Take 100 mg by mouth 2 (two) times daily.        [name] glimepiride (AMARYL) 1 MG tablet Take 1 mg by mouth daily before breakfast.        . lisinopril-hydrochlorothiazide (PRINZIDE,ZESTORETIC) 20-25 MG per tablet Take 1 tablet by mouth daily.        . metFORMIN (GLUCOPHAGE) 500 MG tablet Take 500 mg by mouth daily with breakfast.       . Multiple Vitamin (MULTIVITAMIN) tablet Take 1 tablet by mouth daily.        . Omega-3 Fatty Acids (FISH OIL) 1200 MG CAPS Take by mouth daily.        [name] omeprazole (PRILOSEC) 20 MG capsule Take 20 mg by mouth 2 (two) times daily.        . potassium chloride (KLOR-CON) 10 MEQ CR tablet Take 10 mEq by mouth daily.        . propranolol (INDERAL LA) 120 MG 24 hr capsule Take 1 capsule (120 mg total) by mouth daily.  30 capsule  11  . sildenafil (VIAGRA) 100 MG tablet Take 100 mg by mouth daily as needed.        . simvastatin (ZOCOR) 20 MG tablet Take 20 mg by mouth at bedtime.        [name] warfarin (COUMADIN) 5 MG tablet Take  1 tablet (5 mg total) by mouth as directed.  45 tablet  3  . furosemide (LASIX) 40 MG tablet Take 40 mg by mouth daily as needed.          Past Medical History  Diagnosis Date  . COPD (chronic obstructive pulmonary disease)   . HTN (hypertension)   . HLD (hyperlipidemia)   . Obesity   . Brain aneurysm 2009    questionable. A follow up CTA in 2009 showed no evidence of  . Dyspnea     LHC 11/08: EF 60%, normal coronary arteries;  ETT-echo 2/11: normal at 71% PMHR  . OSA (obstructive sleep apnea)   . PAF (paroxysmal atrial fibrillation)     echo 8/11: mild LVH, EF 60-65%, trivial MR, mod LAE, mild RAE, PASP 43-47, mild pulmo HTN  . Tobacco abuse   . Diabetes mellitus   . Overdose 2009    unintentional Flacanide overdose  . GERD (gastroesophageal reflux disease)     Past Surgical History  Procedure Date  . Appendectomy   . Cardiac catheterization 2008    no significant CAD  . Neck surgery   . Right  shoulder surgery   . Right knee surgery   . Tonsilectomy, adenoidectomy, bilateral myringotomy and tubes   . Left inguinal hernia repair   . Tonsillectomy   . Hernia repair   . Tee without cardioversion 05/05/2011    Procedure: TRANSESOPHAGEAL ECHOCARDIOGRAM (TEE);  Surgeon: Lewayne Bunting, MD;  Location: [facility] ENDOSCOPY;  Service: Cardiovascular;  Laterality: N/A;  . Cardioversion 05/05/2011    Procedure: CARDIOVERSION;  Surgeon: Lewayne Bunting, MD;  Location: [facility] ENDOSCOPY;  Service: Cardiovascular;  Laterality: N/A;    ROS:  As stated in the HPI and negative for all other systems.  PHYSICAL EXAM BP 120/58  Pulse 78  Ht 6\' 3"  (1.905 m)  Wt 258 lb 6.4 oz (117.209 kg)  BMI 32.30 kg/m2 GENERAL:  Well appearing HEENT:  Pupils equal round and reactive, fundi not visualized, oral mucosa , edentulous, ruddy appearance NECK:  No jugular venous distention, waveform within normal limits, carotid upstroke brisk and symmetric, no bruits, no thyromegaly LYMPHATICS:  No cervical,  inguinal adenopathy LUNGS:  Clear to auscultation bilaterally, diminished BS. BACK:  No CVA tenderness CHEST:  Unremarkable HEART:  PMI not displaced or sustained,S1 and S2 within normal limits, no S3, no S4, no clicks, no rubs, no murmurs, regular ABD:  Flat, positive bowel sounds normal in frequency in pitch, no bruits, no rebound, no guarding, no midline pulsatile mass, no hepatomegaly, no splenomegaly EXT:  2 plus pulses throughout, no edema, no cyanosis no clubbing SKIN:  No rashes no nodules NEURO:  Cranial nerves II through XII grossly intact, motor grossly intact throughout PSYCH:  Cognitively intact, oriented to person place and time  EKG:  Sinus rhythm, rate 78, axis within normal limits, intervals within normal limits, no acute ST-T wave changes.  05/13/2011   ASSESSMENT AND PLAN

## 2011-05-13 NOTE — Assessment & Plan Note (Signed)
This is his most significant problem. I'm going to reduce his propranolol to see of this might have contributed to fatigue. I have reviewed labs and he was not anemic earlier this month. His TSH was normal. I will check a CMET. Again he needs management of his sleep apnea. I have asked him to schedule followup soon with his primary provider to review this. If he still fatigue in the weeks to, and maintaining sinus rhythm he will be clear that arrhythmia was not contributing to this.

## 2011-05-13 NOTE — Assessment & Plan Note (Signed)
His blood pressure is controlled. He will continue the medicines as listed.

## 2011-05-13 NOTE — Assessment & Plan Note (Signed)
He has been converted from atrial flutter. He will continue flecainide for management of his fibrillation. He needs to continue anticoagulation.

## 2011-05-13 NOTE — Assessment & Plan Note (Signed)
He understands that he needs complete abstinence. He is trying to cut down.

## 2011-05-13 NOTE — Patient Instructions (Signed)
Please have blood work today. (BMP and Hepatic panel)  Decrease Inderal to 80 mg a day.  Continue all other medications as listed.  You have been referred to pulmonary department for sleep apena.  Follow up with Dr Antoine Poche in 6 weeks

## 2011-05-18 ENCOUNTER — Ambulatory Visit (INDEPENDENT_AMBULATORY_CARE_PROVIDER_SITE_OTHER): Payer: PRIVATE HEALTH INSURANCE | Admitting: *Deleted

## 2011-05-18 ENCOUNTER — Encounter: Payer: PRIVATE HEALTH INSURANCE | Admitting: *Deleted

## 2011-05-18 DIAGNOSIS — Z7901 Long term (current) use of anticoagulants: Secondary | ICD-10-CM

## 2011-05-18 DIAGNOSIS — I4891 Unspecified atrial fibrillation: Secondary | ICD-10-CM

## 2011-05-20 ENCOUNTER — Ambulatory Visit (INDEPENDENT_AMBULATORY_CARE_PROVIDER_SITE_OTHER): Payer: PRIVATE HEALTH INSURANCE | Admitting: *Deleted

## 2011-05-20 DIAGNOSIS — I10 Essential (primary) hypertension: Secondary | ICD-10-CM

## 2011-05-20 LAB — HEPATIC FUNCTION PANEL
AST: 30 U/L (ref 0–37)
Alkaline Phosphatase: 132 U/L — ABNORMAL HIGH (ref 39–117)
Bilirubin, Direct: 0 mg/dL (ref 0.0–0.3)
Total Protein: 6.8 g/dL (ref 6.0–8.3)

## 2011-05-23 ENCOUNTER — Encounter: Payer: PRIVATE HEALTH INSURANCE | Admitting: *Deleted

## 2011-05-27 NOTE — Telephone Encounter (Signed)
New Problem   Patient would like a call back from the nurse regarding test results.

## 2011-05-27 NOTE — Telephone Encounter (Signed)
Reviewed lab results with pt.  He states understanding, that he is feeling better and that he will follow up with Dr Clarene Duke.

## 2011-06-01 ENCOUNTER — Ambulatory Visit (INDEPENDENT_AMBULATORY_CARE_PROVIDER_SITE_OTHER): Payer: PRIVATE HEALTH INSURANCE | Admitting: *Deleted

## 2011-06-01 ENCOUNTER — Ambulatory Visit (INDEPENDENT_AMBULATORY_CARE_PROVIDER_SITE_OTHER): Payer: PRIVATE HEALTH INSURANCE | Admitting: Pulmonary Disease

## 2011-06-01 ENCOUNTER — Encounter: Payer: Self-pay | Admitting: Pulmonary Disease

## 2011-06-01 DIAGNOSIS — G4730 Sleep apnea, unspecified: Secondary | ICD-10-CM

## 2011-06-01 DIAGNOSIS — I4891 Unspecified atrial fibrillation: Secondary | ICD-10-CM

## 2011-06-01 DIAGNOSIS — J438 Other emphysema: Secondary | ICD-10-CM

## 2011-06-01 NOTE — Patient Instructions (Signed)
You have COPD & obstructive sleep apnea  Cough may be related to smoking & sinuses We will try to get your old sleep studies from [place] & deiced about CPAP - you can get this through insurance or go with a refurbished machine on a payment plan STOP smoking & liquor Trial of nasal steroid inhaler Use ZYRTEC daily for sinuses & DELSYM cough syrup 2 tsp thrice daily for cough

## 2011-06-01 NOTE — Progress Notes (Signed)
  Subjective:    Patient ID: [name], male    DOB: [birth year], 56 y.o.   MRN: [id number]  HPI  PCP - Aida Puffer 55/M, smoker with atrial fibrillation for evaluation of dyspnea & sleep apnea. He  had atrial fibrillation treated with flecainide in the past. He had TEE/DCCV. He is maintaining sinus rhythm,is on anticoagulation. Unfortunately he's not feeling any better. He's not noticing his heart beating as quickly accept he's had a few "flutters". However, he is very fatigued. He says he just doesn't feel good. He has vague symptoms of muscle aches. He is trying to cut back on his cigarettes. 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 He reports episodes of coughing which have made him 'pass out'. He reports sinus drainage for many years but has never seen ENT. He does have severe sleep apnea but couldn't afford CPAP. Bedtime is 10p, latency 5 mins, 1-4 awakenings, sleeps with 1-2 pillows, oob at 0600, tired, loud snoring noted by wife & witnessed apneas - worse on his back ESS 3/24 There is no history suggestive of cataplexy, sleep paralysis or parasomnias  He smokes 1 PPD, drinks alcohol -'on weekends' - but I suspect more.  Review of Systems  Constitutional: Negative for fever, appetite change and unexpected weight change.  HENT: Positive for congestion and sneezing. Negative for ear pain, sore throat, rhinorrhea, trouble swallowing, dental problem and postnasal drip.   Eyes: Negative for redness.  Respiratory: Positive for shortness of breath. Negative for cough and wheezing.   Cardiovascular: Positive for chest pain and palpitations. Negative for leg swelling.  Gastrointestinal: Positive for abdominal pain. Negative for nausea, vomiting and diarrhea.  Genitourinary: Negative for dysuria and urgency.  Musculoskeletal: Positive for  arthralgias. Negative for joint swelling.  Skin: Negative for rash.  Neurological: Negative for syncope and headaches.  Hematological: Does not bruise/bleed easily.  Psychiatric/Behavioral: Negative for dysphoric mood. The patient is not nervous/anxious.        Objective:   Physical Exam  Gen. Pleasant, well-nourished, in no distress, normal affect ENT - no lesions, no post nasal drip Neck: No JVD, no thyromegaly, no carotid bruits Lungs: no use of accessory muscles, no dullness to percussion, clear without rales or rhonchi  Cardiovascular: Rhythm regular, heart sounds  normal, no murmurs or gallops, no peripheral edema Abdomen: soft and non-tender, no hepatosplenomegaly, BS normal. Musculoskeletal: No deformities, no cyanosis or clubbing Neuro:  alert, non focal       Assessment & Plan:

## 2011-06-03 NOTE — Assessment & Plan Note (Signed)
Await old PSG results. I discussed options with him - copay with new CPAP or buy refurbished machine on a payment plan Given excessive daytime somnolence, narrow pharyngeal exam, witnessed apneas & loud snoring, obstructive sleep apnea is very likely & an overnight polysomnogram will be scheduled as a split study. The pathophysiology of obstructive sleep apnea , it's cardiovascular consequences esp with atrial fibrillation & modes of treatment including CPAP were discused with the patient in detail & they evidenced understanding. No alcohol at bedtime

## 2011-06-03 NOTE — Assessment & Plan Note (Signed)
Mild gold stg 1 Smoking cessation primary intervention here Used spirometry data to motivate him Cough may be related to smoking & sinuses - trial of Qnasal spray & zyrtec

## 2011-06-20 ENCOUNTER — Other Ambulatory Visit: Payer: Self-pay

## 2011-06-20 MED ORDER — FLECAINIDE ACETATE 100 MG PO TABS: 100.0000 mg | ORAL_TABLET | Freq: Two times a day (BID) | ORAL | Status: DC

## 2011-06-20 NOTE — Telephone Encounter (Signed)
Refill flecaninide acetate 100 mg

## 2011-06-23 ENCOUNTER — Ambulatory Visit (INDEPENDENT_AMBULATORY_CARE_PROVIDER_SITE_OTHER): Payer: PRIVATE HEALTH INSURANCE

## 2011-06-23 ENCOUNTER — Encounter: Payer: Self-pay | Admitting: Cardiology

## 2011-06-23 ENCOUNTER — Ambulatory Visit (INDEPENDENT_AMBULATORY_CARE_PROVIDER_SITE_OTHER): Payer: PRIVATE HEALTH INSURANCE | Admitting: Cardiology

## 2011-06-23 DIAGNOSIS — R5383 Other fatigue: Secondary | ICD-10-CM

## 2011-06-23 DIAGNOSIS — E785 Hyperlipidemia, unspecified: Secondary | ICD-10-CM

## 2011-06-23 DIAGNOSIS — I4891 Unspecified atrial fibrillation: Secondary | ICD-10-CM

## 2011-06-23 DIAGNOSIS — R5381 Other malaise: Secondary | ICD-10-CM

## 2011-06-23 DIAGNOSIS — G629 Polyneuropathy, unspecified: Secondary | ICD-10-CM

## 2011-06-23 DIAGNOSIS — Z7901 Long term (current) use of anticoagulants: Secondary | ICD-10-CM

## 2011-06-23 DIAGNOSIS — G589 Mononeuropathy, unspecified: Secondary | ICD-10-CM

## 2011-06-23 NOTE — Assessment & Plan Note (Signed)
He describes neuropathy. This is likely related to his diabetes. I will refer him back to [facility], [place].  for continued management of his diabetes and treatment of probable neuropathy.

## 2011-06-23 NOTE — Assessment & Plan Note (Signed)
He seems to be maintaining sinus rhythm. His CHADS score is 2. He needs to remain on anticoagulation. I do not see that he's had a recent flecainide level or a stress test since he has been back on 100 mg twice a day. I will arrange this.

## 2011-06-23 NOTE — Patient Instructions (Signed)
Please continue medications as listed  Your physician recommends that you return for a FASTING lipid, liver profile and flecainide level: same day as your treadmill.  Your physician has requested that you have an exercise tolerance test. For further information please visit [email or web address] Please also follow instruction sheet, as given.

## 2011-06-23 NOTE — Progress Notes (Signed)
HPI The patient presents for followup of atrial flutter. He has had atrial fibrillation treated with flecainide in the past. He had TEE/DCCV.  He is maintaining sinus rhythm. He remains on anticoagulation. At the last visit he was not feeling better. He is predominantly complaining of fatigue. I did reduce his propranolol to see if this would help. I also suggested followup of his sleep apnea.  Since the last visit he has done better. He is less fatigued. He's not feeling any palpitations, presyncope or syncope. He's not describing any new shortness of breath, PND or orthopnea. He denies any chest pressure, neck or arm discomfort. He has had no weight gain or edema. His biggest problem has been burning his feet particularly at night. This disturbs his sleep.  Allergies  Allergen Reactions  . Latex     Current Outpatient Prescriptions  Medication Sig Dispense Refill  . b complex vitamins tablet Take 1 tablet by mouth daily.        . flecainide (TAMBOCOR) 100 MG tablet Take 1 tablet (100 mg total) by mouth 2 (two) times daily.  60 tablet  2  . furosemide (LASIX) 40 MG tablet Take 40 mg by mouth daily as needed.        [name] glimepiride (AMARYL) 1 MG tablet Take 1 mg by mouth daily before breakfast.        . lisinopril-hydrochlorothiazide (PRINZIDE,ZESTORETIC) 20-25 MG per tablet Take 1 tablet by mouth daily.        . metFORMIN (GLUCOPHAGE) 500 MG tablet Take 500 mg by mouth daily with breakfast.       . Multiple Vitamin (MULTIVITAMIN) tablet Take 1 tablet by mouth daily.        . Omega-3 Fatty Acids (FISH OIL) 1200 MG CAPS Take by mouth daily.        [name] omeprazole (PRILOSEC) 20 MG capsule Take 20 mg by mouth 2 (two) times daily.        . potassium chloride (KLOR-CON) 10 MEQ CR tablet Take 10 mEq by mouth daily as needed.       . propranolol (INDERAL LA) 80 MG 24 hr capsule Take 1 capsule (80 mg total) by mouth daily.  30 capsule  11  . sildenafil (VIAGRA) 100 MG tablet Take 100 mg by mouth daily  as needed.        . warfarin (COUMADIN) 5 MG tablet Take 1 tablet (5 mg total) by mouth as directed.  45 tablet  3    Past Medical History  Diagnosis Date  . COPD (chronic obstructive pulmonary disease)   . HTN (hypertension)   . HLD (hyperlipidemia)   . Obesity   . Brain aneurysm 2009    questionable. A follow up CTA in 2009 showed no evidence of  . Dyspnea     LHC 11/08: EF 60%, normal coronary arteries;  ETT-echo 2/11: normal at 71% PMHR  . OSA (obstructive sleep apnea)   . PAF (paroxysmal atrial fibrillation)     echo 8/11: mild LVH, EF 60-65%, trivial MR, mod LAE, mild RAE, PASP 43-47, mild pulmo HTN  . Tobacco abuse   . Diabetes mellitus   . Overdose 2009    unintentional Flacanide overdose  . GERD (gastroesophageal reflux disease)     Past Surgical History  Procedure Date  . Appendectomy   . Cardiac catheterization 2008    no significant CAD  . Neck surgery   . Right shoulder surgery   . Right knee surgery   .  Tonsilectomy, adenoidectomy, bilateral myringotomy and tubes   . Left inguinal hernia repair   . Tonsillectomy   . Hernia repair   . Tee without cardioversion 05/05/2011    Procedure: TRANSESOPHAGEAL ECHOCARDIOGRAM (TEE);  Surgeon: Lewayne Bunting, MD;  Location: [facility] ENDOSCOPY;  Service: Cardiovascular;  Laterality: N/A;  . Cardioversion 05/05/2011    Procedure: CARDIOVERSION;  Surgeon: Lewayne Bunting, MD;  Location: [facility] ENDOSCOPY;  Service: Cardiovascular;  Laterality: N/A;    ROS:  Burning in feet.  Otherwise as stated in the HPI and negative for all other systems.  PHYSICAL EXAM BP 139/85  Pulse 72  Ht 6\' 3"  (1.905 m)  Wt 252 lb (114.306 kg)  BMI 31.50 kg/m2 GENERAL:  Well appearing HEENT:  Pupils equal round and reactive, fundi not visualized, oral mucosa , edentulous, ruddy appearance NECK:  No jugular venous distention, waveform within normal limits, carotid upstroke brisk and symmetric, no bruits, no thyromegaly LYMPHATICS:  No cervical,  inguinal adenopathy LUNGS:  Clear to auscultation bilaterally, diminished BS. BACK:  No CVA tenderness CHEST:  Unremarkable HEART:  PMI not displaced or sustained,S1 and S2 within normal limits, no S3, no S4, no clicks, no rubs, no murmurs, regular ABD:  Flat, positive bowel sounds normal in frequency in pitch, no bruits, no rebound, no guarding, no midline pulsatile mass, no hepatomegaly, no splenomegaly EXT:  2 plus pulses throughout, no edema, no cyanosis no clubbing SKIN:  No rashes no nodules NEURO:  Cranial nerves II through XII grossly intact, motor grossly intact throughout PSYCH:  Cognitively intact, oriented to person place and time  ASSESSMENT AND PLAN

## 2011-06-23 NOTE — Assessment & Plan Note (Signed)
The blood pressure is at target. No change in medications is indicated. We will continue with therapeutic lifestyle changes (TLC).  

## 2011-06-23 NOTE — Assessment & Plan Note (Signed)
This seems to be better on a lower dose of propranolol. I would still encourage followup of his sleep apnea. No further workup however his plan.

## 2011-06-23 NOTE — Assessment & Plan Note (Signed)
He was taken off of statins recently because of elevated liver enzymes. I will repeat his fasting lipid profile. We will then consider restarting possibly pravastatin.

## 2011-07-05 ENCOUNTER — Ambulatory Visit: Payer: PRIVATE HEALTH INSURANCE | Admitting: Pulmonary Disease

## 2011-07-13 ENCOUNTER — Ambulatory Visit (INDEPENDENT_AMBULATORY_CARE_PROVIDER_SITE_OTHER): Payer: PRIVATE HEALTH INSURANCE | Admitting: Physician Assistant

## 2011-07-13 ENCOUNTER — Other Ambulatory Visit: Payer: PRIVATE HEALTH INSURANCE

## 2011-07-13 DIAGNOSIS — E785 Hyperlipidemia, unspecified: Secondary | ICD-10-CM

## 2011-07-13 DIAGNOSIS — I4891 Unspecified atrial fibrillation: Secondary | ICD-10-CM

## 2011-07-13 LAB — HEPATIC FUNCTION PANEL
ALT: 50 U/L (ref 0–53)
AST: 37 U/L (ref 0–37)
Bilirubin, Direct: 0.1 mg/dL (ref 0.0–0.3)
Total Bilirubin: 0.8 mg/dL (ref 0.3–1.2)
Total Protein: 7.7 g/dL (ref 6.0–8.3)

## 2011-07-13 LAB — LIPID PANEL
Cholesterol: 287 mg/dL — ABNORMAL HIGH (ref 0–200)
Total CHOL/HDL Ratio: 8
Triglycerides: 452 mg/dL — ABNORMAL HIGH (ref 0.0–149.0)

## 2011-07-13 NOTE — Progress Notes (Signed)
Exercise Treadmill Test  Pre-Exercise Testing Evaluation Rhythm: normal sinus  Rate: 73   PR:  .17 QRS:  .09  QT:  .40 QTc: .43     Test  Exercise Tolerance Test Ordering MD: Angelina Sheriff, MD  Interpreting MD:  Tereso Newcomer PA-C  Unique Test No: 1  Treadmill:  1  Indication for ETT: A-FIB  Contraindication to ETT: No   Stress Modality: exercise - treadmill  Cardiac Imaging Performed: non   Protocol: standard Bruce - maximal  Max BP:180/72  Max MPHR (bpm):  167 85% MPR (bpm):  141  MPHR obtained (bpm):  109 % MPHR obtained: 65%  Reached 85% MPHR (min:sec):N/A Total Exercise Time (min-sec):  6:32  Workload in METS:  5.3 Borg Scale: 15  Reason ETT Terminated:  patient's desire to stop    ST Segment Analysis At Rest: normal ST segments - no evidence of significant ST depression With Exercise: non-specific ST changes at submaximal exercise  Other Information Arrhythmia:  No Angina during ETT:  absent (0) Quality of ETT:  non-diagnostic  ETT Interpretation:  normal - no evidence of ischemia by ST analysis at submaximal exercise  Comments: Poor exercise tolerance. No chest pain. Normal BP response to exercise. No ST-T changes to suggest ischemia at submaximal exercise. No exercise induced ventricular arrhythmias.    Recommendations: Follow up with Dr. Rollene Rotunda as directed. Tereso Newcomer, PA-C  9:11 AM 07/13/2011

## 2011-07-13 NOTE — Progress Notes (Signed)
Addended by: Alma Friendly on: 07/13/2011 09:21 AM   Modules accepted: Orders

## 2011-07-14 ENCOUNTER — Telehealth: Payer: Self-pay | Admitting: Cardiology

## 2011-07-14 DIAGNOSIS — E785 Hyperlipidemia, unspecified: Secondary | ICD-10-CM

## 2011-07-14 MED ORDER — PRAVASTATIN SODIUM 80 MG PO TABS: 80.0000 mg | ORAL_TABLET | Freq: Every evening | ORAL | Status: DC

## 2011-07-14 NOTE — Telephone Encounter (Signed)
Patient returning nurse call, he can be reached at cell # [phone].

## 2011-07-14 NOTE — Telephone Encounter (Signed)
Pt was notified of lab results.  Pravachol order was sent to [facility].  Labs were ordered and scheduled.  Diet was discussed with pt.

## 2011-07-21 ENCOUNTER — Ambulatory Visit (INDEPENDENT_AMBULATORY_CARE_PROVIDER_SITE_OTHER): Payer: BC Managed Care – PPO | Admitting: *Deleted

## 2011-07-21 DIAGNOSIS — Z7901 Long term (current) use of anticoagulants: Secondary | ICD-10-CM

## 2011-07-21 DIAGNOSIS — I4891 Unspecified atrial fibrillation: Secondary | ICD-10-CM

## 2011-07-21 LAB — POCT INR: INR: 2.9

## 2011-07-25 ENCOUNTER — Telehealth: Payer: Self-pay | Admitting: Pulmonary Disease

## 2011-07-25 DIAGNOSIS — G4730 Sleep apnea, unspecified: Secondary | ICD-10-CM

## 2011-07-25 NOTE — Telephone Encounter (Signed)
Reviewed PSG from [facility]  - severe with AHI 31.h corrected by CPAP 5 cm (wt was 250 lbs) OK to send order to DME for this if he is willing to proceed -CPAP 5 cm, mask of choice, humidity, download in 4 wks

## 2011-07-27 NOTE — Telephone Encounter (Signed)
I spoke with pt and he agree's with getting set up on CPAP. i have placed order and nothing further was needed

## 2011-07-27 NOTE — Telephone Encounter (Signed)
lmomtcb x1 for pt 

## 2011-08-01 LAB — FLECAINIDE LEVEL: Flecainide: 0.32

## 2011-08-04 ENCOUNTER — Other Ambulatory Visit: Payer: Self-pay | Admitting: Family Medicine

## 2011-08-04 ENCOUNTER — Inpatient Hospital Stay: Admission: RE | Admit: 2011-08-04 | Payer: BC Managed Care – PPO | Source: Ambulatory Visit

## 2011-08-04 DIAGNOSIS — Z139 Encounter for screening, unspecified: Secondary | ICD-10-CM

## 2011-08-04 DIAGNOSIS — M545 Low back pain: Secondary | ICD-10-CM

## 2011-08-07 ENCOUNTER — Other Ambulatory Visit: Payer: BC Managed Care – PPO

## 2011-08-08 ENCOUNTER — Ambulatory Visit
Admission: RE | Admit: 2011-08-08 | Discharge: 2011-08-08 | Disposition: A | Discharge status: Home / Self Care | Payer: BC Managed Care – PPO | Source: Ambulatory Visit | Attending: Family Medicine | Admitting: Family Medicine

## 2011-08-08 DIAGNOSIS — Z139 Encounter for screening, unspecified: Secondary | ICD-10-CM

## 2011-08-08 DIAGNOSIS — M545 Low back pain: Secondary | ICD-10-CM

## 2011-08-15 ENCOUNTER — Other Ambulatory Visit: Payer: Self-pay | Admitting: Pharmacist

## 2011-08-15 MED ORDER — WARFARIN SODIUM 5 MG PO TABS: ORAL_TABLET | ORAL | Status: DC

## 2011-08-26 ENCOUNTER — Encounter: Payer: Self-pay | Admitting: Gastroenterology

## 2011-09-01 ENCOUNTER — Ambulatory Visit (INDEPENDENT_AMBULATORY_CARE_PROVIDER_SITE_OTHER): Payer: BC Managed Care – PPO

## 2011-09-01 DIAGNOSIS — I4891 Unspecified atrial fibrillation: Secondary | ICD-10-CM

## 2011-09-01 DIAGNOSIS — Z7901 Long term (current) use of anticoagulants: Secondary | ICD-10-CM

## 2011-09-08 ENCOUNTER — Ambulatory Visit: Payer: BC Managed Care – PPO | Admitting: Cardiology

## 2011-09-08 ENCOUNTER — Other Ambulatory Visit (INDEPENDENT_AMBULATORY_CARE_PROVIDER_SITE_OTHER): Payer: BC Managed Care – PPO

## 2011-09-08 ENCOUNTER — Other Ambulatory Visit: Payer: PRIVATE HEALTH INSURANCE

## 2011-09-08 DIAGNOSIS — E785 Hyperlipidemia, unspecified: Secondary | ICD-10-CM

## 2011-09-08 LAB — HEPATIC FUNCTION PANEL
ALT: 55 U/L — ABNORMAL HIGH (ref 0–53)
Albumin: 4 g/dL (ref 3.5–5.2)
Alkaline Phosphatase: 95 U/L (ref 39–117)
Bilirubin, Direct: 0.1 mg/dL (ref 0.0–0.3)
Total Protein: 7.1 g/dL (ref 6.0–8.3)

## 2011-09-13 ENCOUNTER — Other Ambulatory Visit: Payer: Self-pay

## 2011-09-13 MED ORDER — FLECAINIDE ACETATE 100 MG PO TABS: 100.0000 mg | ORAL_TABLET | Freq: Two times a day (BID) | ORAL | Status: DC

## 2011-09-13 NOTE — Telephone Encounter (Signed)
..   Requested Prescriptions   Signed Prescriptions Disp Refills  . flecainide (TAMBOCOR) 100 MG tablet 60 tablet 10    Sig: Take 1 tablet (100 mg total) by mouth 2 (two) times daily.    Authorizing Provider: Rollene Rotunda    Ordering User: Christella Hartigan, Kenric Ginger Judie Petit

## 2011-09-16 ENCOUNTER — Ambulatory Visit: Payer: BC Managed Care – PPO | Admitting: Gastroenterology

## 2011-09-20 ENCOUNTER — Telehealth: Payer: Self-pay | Admitting: Gastroenterology

## 2011-09-21 NOTE — Telephone Encounter (Signed)
Pt appt was changed to 09/30/11 945 am.  The pt has been notified

## 2011-09-30 ENCOUNTER — Encounter: Payer: Self-pay | Admitting: Gastroenterology

## 2011-09-30 ENCOUNTER — Ambulatory Visit (INDEPENDENT_AMBULATORY_CARE_PROVIDER_SITE_OTHER): Payer: BC Managed Care – PPO | Admitting: Gastroenterology

## 2011-09-30 ENCOUNTER — Telehealth: Payer: Self-pay

## 2011-09-30 VITALS — BP 140/76 | HR 60 | Ht 75.0 in | Wt 257.0 lb

## 2011-09-30 DIAGNOSIS — Z6831 Body mass index (BMI) 31.0-31.9, adult: Secondary | ICD-10-CM | POA: Insufficient documentation

## 2011-09-30 DIAGNOSIS — E669 Obesity, unspecified: Secondary | ICD-10-CM

## 2011-09-30 DIAGNOSIS — K219 Gastro-esophageal reflux disease without esophagitis: Secondary | ICD-10-CM

## 2011-09-30 DIAGNOSIS — K625 Hemorrhage of anus and rectum: Secondary | ICD-10-CM

## 2011-09-30 MED ORDER — MOVIPREP 100 G PO SOLR: 1.0000 | Freq: Once | ORAL | Status: DC

## 2011-09-30 NOTE — Progress Notes (Signed)
HPI: This is a   very pleasant 56 year old man who looks 10-15 years older than his actual age.  Has been passing red blood on TP.  For about 1 year.   Has been slowly getting worse, no it is about every.  HE has copd, puffing quite a bit in my office "says this comes and goes".  He is on coumadin intermittently for about 4 years.  This is for a fibrillation.  Cardioverted this past winter for rapid aflutter, cardioversion was successful.    He has epigsatric discomfort, retrosternal discomfort (that radiates to back for 6-7 years).  Has pyrosis ALL the time when off meds. Was taking tums, rolaids for many years.  Takes omeprazole 1-2 times a day, with very good control of symptoms.  No dysphagia.  Overall he has been gaining weight.  Review of systems: Pertinent positive and negative review of systems were noted in the above HPI section. Complete review of systems was performed and was otherwise normal.    Past Medical History  Diagnosis Date  . COPD (chronic obstructive pulmonary disease)   . HTN (hypertension)   . HLD (hyperlipidemia)   . Obesity   . Brain aneurysm 2009    questionable. A follow up CTA in 2009 showed no evidence of  . Dyspnea     LHC 11/08: EF 60%, normal coronary arteries;  ETT-echo 2/11: normal at 71% PMHR  . OSA (obstructive sleep apnea)   . PAF (paroxysmal atrial fibrillation)     echo 8/11: mild LVH, EF 60-65%, trivial MR, mod LAE, mild RAE, PASP 43-47, mild pulmo HTN  . Tobacco abuse   . Diabetes mellitus   . Overdose 2009    unintentional Flacanide overdose  . GERD (gastroesophageal reflux disease)     Past Surgical History  Procedure Date  . Appendectomy   . Cardiac catheterization 2008    no significant CAD  . Neck surgery   . Right shoulder surgery   . Right knee surgery   . Tonsilectomy, adenoidectomy, bilateral myringotomy and tubes   . Left inguinal hernia repair   . Tee without cardioversion 05/05/2011    Procedure: TRANSESOPHAGEAL  ECHOCARDIOGRAM (TEE);  Surgeon: Lewayne Bunting, MD;  Location: [facility] ENDOSCOPY;  Service: Cardiovascular;  Laterality: N/A;  . Cardioversion 05/05/2011    Procedure: CARDIOVERSION;  Surgeon: Lewayne Bunting, MD;  Location: [facility] ENDOSCOPY;  Service: Cardiovascular;  Laterality: N/A;    Current Outpatient Prescriptions  Medication Sig Dispense Refill  . b complex vitamins tablet Take 1 tablet by mouth daily.        . cephALEXin (KEFLEX) 500 MG capsule Take 500 mg by mouth 3 (three) times daily.      . flecainide (TAMBOCOR) 100 MG tablet Take 1 tablet (100 mg total) by mouth 2 (two) times daily.  60 tablet  10  . furosemide (LASIX) 40 MG tablet Take 40 mg by mouth daily as needed.        [name] glimepiride (AMARYL) 1 MG tablet Take 1 mg by mouth daily before breakfast.        . lisinopril-hydrochlorothiazide (PRINZIDE,ZESTORETIC) 20-25 MG per tablet Take 1 tablet by mouth daily.        . Loratadine-Pseudoephedrine (CLARITIN-D 24 HOUR PO) Take by mouth as directed.      . metFORMIN (GLUCOPHAGE) 500 MG tablet Take 500 mg by mouth daily with breakfast.       . Multiple Vitamin (MULTIVITAMIN) tablet Take 1 tablet by mouth daily.        [name]  Omega-3 Fatty Acids (FISH OIL) 1200 MG CAPS Take by mouth daily.        [name] omeprazole (PRILOSEC) 20 MG capsule Take 20 mg by mouth 2 (two) times daily.        . potassium chloride (KLOR-CON) 10 MEQ CR tablet Take 10 mEq by mouth daily as needed.       . pravastatin (PRAVACHOL) 80 MG tablet Take 1 tablet (80 mg total) by mouth every evening.  30 tablet  11  . predniSONE (DELTASONE) 10 MG tablet Take 10 mg by mouth as directed.      . propranolol (INDERAL LA) 80 MG 24 hr capsule Take 1 capsule (80 mg total) by mouth daily.  30 capsule  11  . sildenafil (VIAGRA) 100 MG tablet Take 100 mg by mouth daily as needed.        . warfarin (COUMADIN) 5 MG tablet Take as directed by Anticoagulation clinic  45 tablet  3    Allergies as of 09/30/2011 - Review Complete 09/30/2011    Allergen Reaction Noted  . Latex  05/02/2011    Family History  Problem Relation Age of Onset  . Colon cancer Neg Hx   . Anesthesia problems Neg Hx   . Hypotension Neg Hx   . Malignant hyperthermia Neg Hx   . Pseudochol deficiency Neg Hx     History   Social History  . Marital Status: Married    Spouse Name: N/A    Number of Children: 2  . Years of Education: N/A   Occupational History  . Mechanic     Social History Main Topics  . Smoking status: Current Everyday Smoker -- 1.0 packs/day for 40 years    Types: Cigarettes  . Smokeless tobacco: Never Used   Comment: Counseling sheet 09-2011   . Alcohol Use: 0.0 oz/week     3-4 drinks a night    . Drug Use: No  . Sexually Active: Yes   Other Topics Concern  . Not on file   Social History Narrative   Daily caffeine(Mountain Dew)        Physical Exam: BP 140/76  Pulse 60  Ht 6\' 3"  (1.905 m)  Wt 257 lb (116.574 kg)  BMI 32.12 kg/m2 Constitutional: Generally chronically ill-appearing, "pink puffer" Psychiatric: alert and oriented x3 Eyes: extraocular movements intact Mouth: oral pharynx moist, no lesions Neck: supple no lymphadenopathy Cardiovascular: heart regular rate and rhythm Lungs: Wheezes bilaterally, decent air movement Abdomen: soft, nontender, nondistended, no obvious ascites, no peritoneal signs, normal bowel sounds Extremities: no lower extremity edema bilaterally Skin: no lesions on visible extremities    Assessment and plan: 56 y.o. male with  significant COPD, atrial fibrillation, rectal bleeding, chronic GERD  Recommended colonoscopy and upper endoscopy to evaluate his 1-2 years of rectal bleeding as well as his chronic GERD. This was recommended to him about a year ago by Dr. Dulce Sellar as well. He is a classic appearing "pink puffer" from a pulmonary standpoint and he is on Coumadin for atrial fibrillation. I think this would be safest to do at [facility] with anesthesia assistance,  MAC sedation. He'll also need to hold his Coumadin for 5 days prior to the procedures to reduce the chance of bleeding. We will communicate with his cardiologist Dr Antoine Poche to make sure that is safe. I see no reason for any further blood tests or imaging studies prior to then.

## 2011-09-30 NOTE — Telephone Encounter (Signed)
  RE: [name] DOB: Jul 18, 1955 MRN: [id number]   Dear Dr. Antoine Poche,    We have scheduled the above patient for an endoscopic procedure. Our records show that he is on anticoagulation therapy.   Please advise as to how long the patient may come off his therapy of coumadin prior to the procedure, which is scheduled for 10/13/11.  Please fax back/ or route the completed form to Christie Nottingham or Chales Abrahams at [phone].   Sincerely,  Christie Nottingham, CMA Chales Abrahams, CMA  Please respond by 10/05/11 and route back to Chales Abrahams, CMA or Christie Nottingham, CMA

## 2011-09-30 NOTE — Patient Instructions (Addendum)
One of your biggest health concerns is your smoking.  This increases your risk for most cancers and serious cardiovascular diseases such as strokes, heart attacks.  You should try your best to stop.  If you need assistance, please contact your PCP or Smoking Cessation Class at [facility] [phone]) or [facility] Quit-Line (1-800-QUIT-NOW). You will be set up for a colonoscopy for rectal bleeding (MAC at [facility]) You will be set up for an upper endoscopy for long standing GERD (MAC at [facility]). We will contact Dr. Antoine Poche about holding your coumadin for 5 days prior to the procedures to decrease risk of bleeding.

## 2011-10-03 NOTE — Telephone Encounter (Signed)
OK to hold anticoagulation as needed for the procedure.   

## 2011-10-04 NOTE — Telephone Encounter (Signed)
Pt has been notified of the anticoag hold per Dr Temple Lions

## 2011-10-12 ENCOUNTER — Ambulatory Visit: Payer: BC Managed Care – PPO | Admitting: Gastroenterology

## 2011-10-13 ENCOUNTER — Encounter (HOSPITAL_COMMUNITY)
Admission: RE | Disposition: A | Discharge status: Home / Self Care | Payer: Self-pay | Source: Ambulatory Visit | Attending: Gastroenterology

## 2011-10-13 ENCOUNTER — Encounter (HOSPITAL_COMMUNITY): Payer: Self-pay | Admitting: Registered Nurse

## 2011-10-13 ENCOUNTER — Ambulatory Visit (HOSPITAL_COMMUNITY)
Admission: RE | Admit: 2011-10-13 | Discharge: 2011-10-13 | Disposition: A | Discharge status: Home / Self Care | Payer: BC Managed Care – PPO | Source: Ambulatory Visit | Attending: Gastroenterology | Admitting: Gastroenterology

## 2011-10-13 ENCOUNTER — Ambulatory Visit (HOSPITAL_COMMUNITY): Payer: BC Managed Care – PPO | Admitting: Registered Nurse

## 2011-10-13 ENCOUNTER — Other Ambulatory Visit: Payer: Self-pay

## 2011-10-13 ENCOUNTER — Encounter (HOSPITAL_COMMUNITY): Payer: Self-pay | Admitting: Gastroenterology

## 2011-10-13 DIAGNOSIS — I4891 Unspecified atrial fibrillation: Secondary | ICD-10-CM | POA: Insufficient documentation

## 2011-10-13 DIAGNOSIS — K644 Residual hemorrhoidal skin tags: Secondary | ICD-10-CM | POA: Insufficient documentation

## 2011-10-13 DIAGNOSIS — K2990 Gastroduodenitis, unspecified, without bleeding: Secondary | ICD-10-CM | POA: Insufficient documentation

## 2011-10-13 DIAGNOSIS — K2970 Gastritis, unspecified, without bleeding: Secondary | ICD-10-CM

## 2011-10-13 DIAGNOSIS — K649 Unspecified hemorrhoids: Secondary | ICD-10-CM

## 2011-10-13 DIAGNOSIS — E119 Type 2 diabetes mellitus without complications: Secondary | ICD-10-CM | POA: Insufficient documentation

## 2011-10-13 DIAGNOSIS — J449 Chronic obstructive pulmonary disease, unspecified: Secondary | ICD-10-CM | POA: Insufficient documentation

## 2011-10-13 DIAGNOSIS — I10 Essential (primary) hypertension: Secondary | ICD-10-CM | POA: Insufficient documentation

## 2011-10-13 DIAGNOSIS — D126 Benign neoplasm of colon, unspecified: Secondary | ICD-10-CM

## 2011-10-13 DIAGNOSIS — Z79899 Other long term (current) drug therapy: Secondary | ICD-10-CM | POA: Insufficient documentation

## 2011-10-13 DIAGNOSIS — K922 Gastrointestinal hemorrhage, unspecified: Secondary | ICD-10-CM

## 2011-10-13 DIAGNOSIS — E785 Hyperlipidemia, unspecified: Secondary | ICD-10-CM | POA: Insufficient documentation

## 2011-10-13 DIAGNOSIS — Z7901 Long term (current) use of anticoagulants: Secondary | ICD-10-CM | POA: Insufficient documentation

## 2011-10-13 DIAGNOSIS — J4489 Other specified chronic obstructive pulmonary disease: Secondary | ICD-10-CM | POA: Insufficient documentation

## 2011-10-13 DIAGNOSIS — G4733 Obstructive sleep apnea (adult) (pediatric): Secondary | ICD-10-CM | POA: Insufficient documentation

## 2011-10-13 DIAGNOSIS — K219 Gastro-esophageal reflux disease without esophagitis: Secondary | ICD-10-CM

## 2011-10-13 SURGERY — ESOPHAGOGASTRODUODENOSCOPY (EGD) WITH PROPOFOL
Anesthesia: Monitor Anesthesia Care

## 2011-10-13 MED ORDER — SPOT INK MARKER SYRINGE KIT
PACK | SUBMUCOSAL | Status: AC
  Filled 2011-10-13: qty 5

## 2011-10-13 MED ORDER — ALBUTEROL SULFATE (5 MG/ML) 0.5% IN NEBU: 2.5000 mg | INHALATION_SOLUTION | RESPIRATORY_TRACT | Status: DC

## 2011-10-13 MED ORDER — ALBUTEROL SULFATE (5 MG/ML) 0.5% IN NEBU
INHALATION_SOLUTION | RESPIRATORY_TRACT | Status: AC
  Filled 2011-10-13: qty 0.5

## 2011-10-13 MED ORDER — LISINOPRIL-HYDROCHLOROTHIAZIDE 20-25 MG PO TABS: 1.0000 | ORAL_TABLET | Freq: Every day | ORAL | Status: DC

## 2011-10-13 MED ORDER — GLYCOPYRROLATE 0.2 MG/ML IJ SOLN
INTRAMUSCULAR | Status: DC | PRN
  Administered 2011-10-13: 0.2 mg via INTRAVENOUS

## 2011-10-13 MED ORDER — LIDOCAINE HCL (CARDIAC) 20 MG/ML IV SOLN
INTRAVENOUS | Status: DC | PRN
  Administered 2011-10-13: 50 mg via INTRAVENOUS

## 2011-10-13 MED ORDER — LACTATED RINGERS IV SOLN
INTRAVENOUS | Status: DC
  Administered 2011-10-13: 1000 mL via INTRAVENOUS

## 2011-10-13 MED ORDER — MIDAZOLAM HCL 5 MG/5ML IJ SOLN
INTRAMUSCULAR | Status: DC | PRN
  Administered 2011-10-13: 2 mg via INTRAVENOUS

## 2011-10-13 MED ORDER — LIDOCAINE VISCOUS 2 % MT SOLN
OROMUCOSAL | Status: AC
  Filled 2011-10-13: qty 15

## 2011-10-13 MED ORDER — FENTANYL CITRATE 0.05 MG/ML IJ SOLN: 25.0000 ug | INTRAMUSCULAR | Status: DC | PRN

## 2011-10-13 MED ORDER — BUTAMBEN-TETRACAINE-BENZOCAINE 2-2-14 % EX AERO
INHALATION_SPRAY | CUTANEOUS | Status: DC | PRN
  Administered 2011-10-13: 2 via TOPICAL

## 2011-10-13 MED ORDER — FENTANYL CITRATE 0.05 MG/ML IJ SOLN
INTRAMUSCULAR | Status: DC | PRN
  Administered 2011-10-13: 50 ug via INTRAVENOUS

## 2011-10-13 MED ORDER — PROPOFOL 10 MG/ML IV EMUL
INTRAVENOUS | Status: DC | PRN
  Administered 2011-10-13: 100 ug/kg/min via INTRAVENOUS

## 2011-10-13 SURGICAL SUPPLY — 24 items

## 2011-10-13 NOTE — H&P (View-Only) (Signed)
HPI: This is a   very pleasant 55-year-old man who looks 10-15 years older than his actual age.  Has been passing red blood on TP.  For about 1 year.   Has been slowly getting worse, no it is about every.  HE has copd, puffing quite a bit in my office "says this comes and goes".  He is on coumadin intermittently for about 4 years.  This is for a fibrillation.  Cardioverted this past winter for rapid aflutter, cardioversion was successful.    He has epigsatric discomfort, retrosternal discomfort (that radiates to back for 6-7 years).  Has pyrosis ALL the time when off meds. Was taking tums, rolaids for many years.  Takes omeprazole 1-2 times a day, with very good control of symptoms.  No dysphagia.  Overall he has been gaining weight.  Review of systems: Pertinent positive and negative review of systems were noted in the above HPI section. Complete review of systems was performed and was otherwise normal.    Past Medical History  Diagnosis Date  . COPD (chronic obstructive pulmonary disease)   . HTN (hypertension)   . HLD (hyperlipidemia)   . Obesity   . Brain aneurysm 2009    questionable. A follow up CTA in 2009 showed no evidence of  . Dyspnea     LHC 11/08: EF 60%, normal coronary arteries;  ETT-echo 2/11: normal at 71% PMHR  . OSA (obstructive sleep apnea)   . PAF (paroxysmal atrial fibrillation)     echo 8/11: mild LVH, EF 60-65%, trivial MR, mod LAE, mild RAE, PASP 43-47, mild pulmo HTN  . Tobacco abuse   . Diabetes mellitus   . Overdose 2009    unintentional Flacanide overdose  . GERD (gastroesophageal reflux disease)     Past Surgical History  Procedure Date  . Appendectomy   . Cardiac catheterization 2008    no significant CAD  . Neck surgery   . Right shoulder surgery   . Right knee surgery   . Tonsilectomy, adenoidectomy, bilateral myringotomy and tubes   . Left inguinal hernia repair   . Tee without cardioversion 05/05/2011    Procedure: TRANSESOPHAGEAL  ECHOCARDIOGRAM (TEE);  Surgeon: Brian S Crenshaw, MD;  Location: [facility] ENDOSCOPY;  Service: Cardiovascular;  Laterality: N/A;  . Cardioversion 05/05/2011    Procedure: CARDIOVERSION;  Surgeon: Brian S Crenshaw, MD;  Location: [facility] ENDOSCOPY;  Service: Cardiovascular;  Laterality: N/A;    Current Outpatient Prescriptions  Medication Sig Dispense Refill  . b complex vitamins tablet Take 1 tablet by mouth daily.        . cephALEXin (KEFLEX) 500 MG capsule Take 500 mg by mouth 3 (three) times daily.      . flecainide (TAMBOCOR) 100 MG tablet Take 1 tablet (100 mg total) by mouth 2 (two) times daily.  60 tablet  10  . furosemide (LASIX) 40 MG tablet Take 40 mg by mouth daily as needed.        . glimepiride (AMARYL) 1 MG tablet Take 1 mg by mouth daily before breakfast.        . lisinopril-hydrochlorothiazide (PRINZIDE,ZESTORETIC) 20-25 MG per tablet Take 1 tablet by mouth daily.        . Loratadine-Pseudoephedrine (CLARITIN-D 24 HOUR PO) Take by mouth as directed.      . metFORMIN (GLUCOPHAGE) 500 MG tablet Take 500 mg by mouth daily with breakfast.       . Multiple Vitamin (MULTIVITAMIN) tablet Take 1 tablet by mouth daily.        .   Omega-3 Fatty Acids (FISH OIL) 1200 MG CAPS Take by mouth daily.        . omeprazole (PRILOSEC) 20 MG capsule Take 20 mg by mouth 2 (two) times daily.        . potassium chloride (KLOR-CON) 10 MEQ CR tablet Take 10 mEq by mouth daily as needed.       . pravastatin (PRAVACHOL) 80 MG tablet Take 1 tablet (80 mg total) by mouth every evening.  30 tablet  11  . predniSONE (DELTASONE) 10 MG tablet Take 10 mg by mouth as directed.      . propranolol (INDERAL LA) 80 MG 24 hr capsule Take 1 capsule (80 mg total) by mouth daily.  30 capsule  11  . sildenafil (VIAGRA) 100 MG tablet Take 100 mg by mouth daily as needed.        . warfarin (COUMADIN) 5 MG tablet Take as directed by Anticoagulation clinic  45 tablet  3    Allergies as of 09/30/2011 - Review Complete 09/30/2011    Allergen Reaction Noted  . Latex  05/02/2011    Family History  Problem Relation Age of Onset  . Colon cancer Neg Hx   . Anesthesia problems Neg Hx   . Hypotension Neg Hx   . Malignant hyperthermia Neg Hx   . Pseudochol deficiency Neg Hx     History   Social History  . Marital Status: Married    Spouse Name: N/A    Number of Children: 2  . Years of Education: N/A   Occupational History  . Mechanic     Social History Main Topics  . Smoking status: Current Everyday Smoker -- 1.0 packs/day for 40 years    Types: Cigarettes  . Smokeless tobacco: Never Used   Comment: Counseling sheet 09-2011   . Alcohol Use: 0.0 oz/week     3-4 drinks a night    . Drug Use: No  . Sexually Active: Yes   Other Topics Concern  . Not on file   Social History Narrative   Daily caffeine(Mountain Dew)        Physical Exam: BP 140/76  Pulse 60  Ht 6' 3" (1.905 m)  Wt 257 lb (116.574 kg)  BMI 32.12 kg/m2 Constitutional: Generally chronically ill-appearing, "pink puffer" Psychiatric: alert and oriented x3 Eyes: extraocular movements intact Mouth: oral pharynx moist, no lesions Neck: supple no lymphadenopathy Cardiovascular: heart regular rate and rhythm Lungs: Wheezes bilaterally, decent air movement Abdomen: soft, nontender, nondistended, no obvious ascites, no peritoneal signs, normal bowel sounds Extremities: no lower extremity edema bilaterally Skin: no lesions on visible extremities    Assessment and plan: 55 y.o. male with  significant COPD, atrial fibrillation, rectal bleeding, chronic GERD  Recommended colonoscopy and upper endoscopy to evaluate his 1-2 years of rectal bleeding as well as his chronic GERD. This was recommended to him about a year ago by Dr. Outlaw as well. He is a classic appearing "pink puffer" from a pulmonary standpoint and he is on Coumadin for atrial fibrillation. I think this would be safest to do at  hospital with anesthesia assistance,  MAC sedation. He'll also need to hold his Coumadin for 5 days prior to the procedures to reduce the chance of bleeding. We will communicate with his cardiologist Dr Hochrein to make sure that is safe. I see no reason for any further blood tests or imaging studies prior to then.     

## 2011-10-13 NOTE — Op Note (Signed)
[facility] [address] [place], [place]  [address]  COLONOSCOPY PROCEDURE REPORT  PATIENT:  Dolton, Shaker  MR#:  [id number] BIRTHDATE:  [birth year], 55 yrs. old  GENDER:  male ENDOSCOPIST:  Rachael Fee, MD REF. BY:  Aida Puffer, M.D. PROCEDURE DATE:  10/13/2011 PROCEDURE:  Colonoscopy with biopsy and snare polypectomy, Colonoscopy for control of bleeding, Colonoscopy with submucosal injection ASA CLASS:  Class IV INDICATIONS:  minor rectal bleeding MEDICATIONS:   MAC sedation, administered by CRNA  DESCRIPTION OF PROCEDURE:   After the risks benefits and alternatives of the procedure were thoroughly explained, informed consent was obtained.  Digital rectal exam was performed and revealed no rectal masses.   The Pentax Colonoscope [id number] endoscope was introduced through the anus and advanced to the cecum, which was identified by both the appendix and ileocecal valve, without limitations.  The quality of the prep was good.. The instrument was then slowly withdrawn as the colon was fully examined.<<PROCEDUREIMAGES>>  FINDINGS:  Nine polyps were found, all were removed and all sent to pathology. 8 of the polyps were sessile, located fairly evenly throughout colon, measured 2-58mm across.  These were all put in path jar 1.  Most were removed with cold snare, two were removed with biopsy forceps. The 7mm polyp, located in descending segment bled more than usual immediately after removal and so the bleeding was treated with placement of endoclip. The last polyp was 2.5cm across, pedunculated, located in sigmoid. This was removed with snare, cautery (path jar 2) and since the polyp was somewhat firm the site was labeled with injection of [place] Ink (see image002, image006, image010, image013, image015, image017, and image019). External Hemorrhoids were found.  This was otherwise a normal examination of the colon (see image020, image001, and image004). Retroflexed  views in the rectum revealed no abnormalities. COMPLICATIONS:  None  ENDOSCOPIC IMPRESSION: 1) Nine polyps; removed with snare and biopsy forceps.  One polyp site bled more than usual and this was treated with endoclip placement. The largest polyp (sigmoid colon) was removed with sanre/cautery and the site labeled with [place] ink given that it was somewhat firm. 2) External hemorrhoids 3) Otherwise normal examination  RECOMMENDATIONS: You will receive a letter within 1-2 weeks with the results of your biopsy as well as final recommendations. Please call my office if you have not received a letter after 3 weeks. Await final pathology. Given large number of polyps (nine), I will likely receommend repeat examination in 1 year (MAC sedation again).  ______________________________ Rachael Fee, MD  n. eSIGNED:   Rachael Fee at 10/13/2011 09:48 AM  Paula Libra, [phone]

## 2011-10-13 NOTE — Anesthesia Postprocedure Evaluation (Signed)
  Anesthesia Post-op Note  Patient: [name]  Procedure(s) Performed: Procedure(s) (LRB): ESOPHAGOGASTRODUODENOSCOPY (EGD) WITH PROPOFOL (N/A) COLONOSCOPY WITH PROPOFOL (N/A)  Patient Location: PACU  Anesthesia Type: MAC  Level of Consciousness: awake and alert   Airway and Oxygen Therapy: Patient Spontanous Breathing  Post-op Pain: mild  Post-op Assessment: Post-op Vital signs reviewed, Patient's Cardiovascular Status Stable, Respiratory Function Stable, Patent Airway and No signs of Nausea or vomiting  Post-op Vital Signs: stable  Complications: No apparent anesthesia complications

## 2011-10-13 NOTE — Transfer of Care (Signed)
Immediate Anesthesia Transfer of Care Note  Patient: [name]  Procedure(s) Performed: Procedure(s) (LRB): ESOPHAGOGASTRODUODENOSCOPY (EGD) WITH PROPOFOL (N/A) COLONOSCOPY WITH PROPOFOL (N/A)  Patient Location: PACU  Anesthesia Type: MAC  Level of Consciousness: awake, alert  and patient cooperative  Airway & Oxygen Therapy: Patient Spontanous Breathing and Patient connected to nasal cannula oxygen  Post-op Assessment: Report given to PACU RN, Post -op Vital signs reviewed and stable and Patient moving all extremities X 4  Post vital signs: stable  Complications: No apparent anesthesia complications

## 2011-10-13 NOTE — Op Note (Signed)
[facility] [address] [place], [place]  [address]  ENDOSCOPY PROCEDURE REPORT  PATIENT:  Ryan, [name]  MR#:  [id number] BIRTHDATE:  May 13, 1956, 55 yrs. old  GENDER:  male ENDOSCOPIST:  Rachael Fee, MD PROCEDURE DATE:  10/13/2011 PROCEDURE:  EGD, diagnostic [phone] ASA CLASS:  Class IV INDICATIONS:  chronic GERD MEDICATIONS:   MAC sedation, administered by CRNA TOPICAL ANESTHETIC:  Cetacaine Spray, Viscous Xylocaine  DESCRIPTION OF PROCEDURE:   After the risks benefits and alternatives of the procedure were thoroughly explained, informed consent was obtained.  The Pentax Gastroscope [id number] endoscope was introduced through the mouth and advanced to the second portion of the duodenum, without limitations.  The instrument was slowly withdrawn as the mucosa was fully examined. <<PROCEDUREIMAGES>>  There was mild, non-specific gastritis (see image3).  Otherwise the examination was normal (see image1, image2, and image4). Retroflexed views revealed no abnormalities.    The scope was then withdrawn from the patient and the procedure completed.COMPLICATIONS:  None  ENDOSCOPIC IMPRESSION: Mild gastritis, otherwise normal examination  RECOMMENDATIONS: You should continue daily PPI for your chronic heartburn. Quitting smoking will help improve GERD.  ______________________________ Rachael Fee, MD  WG:NFAOZ Clarene Duke, MD  n. eSIGNED:   Rachael Fee at 10/13/2011 09:52 AM  Paula Libra, [phone]

## 2011-10-13 NOTE — Telephone Encounter (Signed)
..   Requested Prescriptions   Signed Prescriptions Disp Refills  . lisinopril-hydrochlorothiazide (PRINZIDE,ZESTORETIC) 20-25 MG per tablet 30 tablet 2    Sig: Take 1 tablet by mouth daily.    Authorizing Provider: Rollene Rotunda    Ordering User: Christella Hartigan, Anja Neuzil Judie Petit

## 2011-10-13 NOTE — Anesthesia Preprocedure Evaluation (Addendum)
Anesthesia Evaluation  Patient identified by MRN, date of birth, ID band Patient awake    Reviewed: Allergy & Precautions, H&P , NPO status , Patient's Chart, lab work & pertinent test results, reviewed documented beta blocker date and time   Airway Mallampati: III TM Distance: >3 FB Neck ROM: full    Dental  (+) Edentulous Upper and Edentulous Lower   Pulmonary neg pulmonary ROS, shortness of breath and with exertion, sleep apnea , COPDCurrent Smoker,  breath sounds clear to auscultation  Pulmonary exam normal       Cardiovascular Exercise Tolerance: Good hypertension, Pt. on medications and Pt. on home beta blockers negative cardio ROS  + dysrhythmias Atrial Fibrillation Rhythm:regular Rate:Normal  Cath. 11/08. EF 60%. Normal coronary arteries.  Hx. PAF but NSR now.  Mild pulmonary htn.    Neuro/Psych Questionable brain aneurysm. negative neurological ROS  negative psych ROS   GI/Hepatic negative GI ROS, Neg liver ROS, GERD-  Medicated and Controlled,  Endo/Other  negative endocrine ROSDiabetes mellitus-, Well Controlled, Type 2, Oral Hypoglycemic AgentsMorbid obesity  Renal/GU negative Renal ROS  negative genitourinary   Musculoskeletal   Abdominal   Peds  Hematology negative hematology ROS (+)   Anesthesia Other Findings   Reproductive/Obstetrics negative OB ROS                         Anesthesia Physical Anesthesia Plan  ASA: III  Anesthesia Plan: MAC   Post-op Pain Management:    Induction:   Airway Management Planned:   Additional Equipment:   Intra-op Plan:   Post-operative Plan:   Informed Consent: I have reviewed the patients History and Physical, chart, labs and discussed the procedure including the risks, benefits and alternatives for the proposed anesthesia with the patient or authorized representative who has indicated his/her understanding and acceptance.   Dental  Advisory Given  Plan Discussed with: CRNA and Surgeon  Anesthesia Plan Comments:        Anesthesia Quick Evaluation

## 2011-10-13 NOTE — Discharge Instructions (Signed)
YOU CAN RESTART YOUR COUMADIN IN 3 DAYS  YOU HAD AN ENDOSCOPIC PROCEDURE TODAY: Refer to the procedure report that was given to you for any specific questions about what was found during the examination.  If the procedure report does not answer your questions, please call your gastroenterologist to clarify.  YOU SHOULD EXPECT: Some feelings of bloating in the abdomen. Passage of more gas than usual.  Walking can help get rid of the air that was put into your GI tract during the procedure and reduce the bloating. If you had a lower endoscopy (such as a colonoscopy or flexible sigmoidoscopy) you may notice spotting of blood in your stool or on the toilet paper.   DIET: Your first meal following the procedure should be a light meal and then it is ok to progress to your normal diet.  A half-sandwich or bowl of soup is an example of a good first meal.  Heavy or fried foods are harder to digest and may make you feel nasueas or bloated.  Drink plenty of fluids but you should avoid alcoholic beverages for 24 hours.  ACTIVITY: Your care partner should take you home directly after the procedure.  You should plan to take it easy, moving slowly for the rest of the day.  You can resume normal activity the day after the procedure however you should NOT DRIVE or use heavy machinery for 24 hours (because of the sedation medicines used during the test).    SYMPTOMS TO REPORT IMMEDIATELY  A gastroenterologist can be reached at any hour.  Please call your doctor's office for any of the following symptoms:   Following lower endoscopy (colonoscopy, flexible sigmoidoscopy)  Excessive amounts of blood in the stool  Significant tenderness, worsening of abdominal pains  Swelling of the abdomen that is new, acute  Fever of 100 or higher  Following upper endoscopy (EGD, EUS, ERCP)  Vomiting of blood or coffee ground material  New, significant abdominal pain  New, significant chest pain or pain under the shoulder  blades  Painful or persistently difficult swallowing  New shortness of breath  Black, tarry-looking stools  FOLLOW UP: If any biopsies were taken you will be contacted by phone or by letter within the next 1-3 weeks.  Call your gastroenterologist if you have not heard about the biopsies in 3 weeks.  Please also call your gastroenterologist's office with any specific questions about appointments or follow up tests.

## 2011-10-13 NOTE — Interval H&P Note (Signed)
History and Physical Interval Note:  10/13/2011 8:10 AM  [name]  has presented today for surgery, with the diagnosis of Rectal bleeding, GERD  The various methods of treatment have been discussed with the patient and family. After consideration of risks, benefits and other options for treatment, the patient has consented to  Procedure(s) (LRB): ESOPHAGOGASTRODUODENOSCOPY (EGD) WITH PROPOFOL (N/A) COLONOSCOPY WITH PROPOFOL (N/A) as a surgical intervention .  The patients' history has been reviewed, patient examined, no change in status, stable for surgery.  I have reviewed the patients' chart and labs.  Questions were answered to the patient's satisfaction.     Rob Bunting

## 2011-10-19 ENCOUNTER — Telehealth: Payer: Self-pay | Admitting: Gastroenterology

## 2011-10-19 NOTE — Telephone Encounter (Signed)
Pt aware.

## 2011-10-19 NOTE — Telephone Encounter (Signed)
Patty, i clicked away too soon and so cannot now write a results letter.  Can you call him to let him know the path showed typical precancerous polyps, he needs recall colonoscoyp in 1 year.  thanks

## 2011-10-19 NOTE — Telephone Encounter (Signed)
Pt notified of his colon path results and recall colon in 1 year

## 2011-10-25 ENCOUNTER — Ambulatory Visit (INDEPENDENT_AMBULATORY_CARE_PROVIDER_SITE_OTHER): Payer: BC Managed Care – PPO | Admitting: *Deleted

## 2011-10-25 DIAGNOSIS — Z7901 Long term (current) use of anticoagulants: Secondary | ICD-10-CM

## 2011-10-25 DIAGNOSIS — I4891 Unspecified atrial fibrillation: Secondary | ICD-10-CM

## 2011-10-31 ENCOUNTER — Ambulatory Visit
Admission: RE | Admit: 2011-10-31 | Discharge: 2011-10-31 | Disposition: A | Discharge status: Home / Self Care | Payer: BC Managed Care – PPO | Source: Ambulatory Visit | Attending: Family Medicine | Admitting: Family Medicine

## 2011-10-31 ENCOUNTER — Other Ambulatory Visit: Payer: Self-pay | Admitting: Family Medicine

## 2011-10-31 DIAGNOSIS — Z139 Encounter for screening, unspecified: Secondary | ICD-10-CM

## 2011-10-31 DIAGNOSIS — M5410 Radiculopathy, site unspecified: Secondary | ICD-10-CM

## 2011-11-08 ENCOUNTER — Other Ambulatory Visit: Payer: Self-pay | Admitting: Neurosurgery

## 2011-11-21 ENCOUNTER — Encounter (HOSPITAL_COMMUNITY): Payer: Self-pay | Admitting: Vascular Surgery

## 2011-11-21 ENCOUNTER — Telehealth: Payer: Self-pay | Admitting: Pulmonary Disease

## 2011-11-21 ENCOUNTER — Encounter (HOSPITAL_COMMUNITY)
Admission: RE | Admit: 2011-11-21 | Discharge: 2011-11-21 | Disposition: A | Discharge status: Home / Self Care | Payer: BC Managed Care – PPO | Source: Ambulatory Visit | Attending: Neurosurgery | Admitting: Neurosurgery

## 2011-11-21 ENCOUNTER — Encounter (HOSPITAL_COMMUNITY)
Admission: RE | Admit: 2011-11-21 | Discharge: 2011-11-21 | Disposition: A | Discharge status: Home / Self Care | Payer: BC Managed Care – PPO | Source: Ambulatory Visit | Attending: Anesthesiology | Admitting: Anesthesiology

## 2011-11-21 ENCOUNTER — Encounter (HOSPITAL_COMMUNITY): Payer: Self-pay

## 2011-11-21 DIAGNOSIS — M542 Cervicalgia: Secondary | ICD-10-CM | POA: Insufficient documentation

## 2011-11-21 DIAGNOSIS — Z01818 Encounter for other preprocedural examination: Secondary | ICD-10-CM | POA: Insufficient documentation

## 2011-11-21 DIAGNOSIS — Z01812 Encounter for preprocedural laboratory examination: Secondary | ICD-10-CM | POA: Insufficient documentation

## 2011-11-21 DIAGNOSIS — M5000 Cervical disc disorder with myelopathy, unspecified cervical region: Secondary | ICD-10-CM | POA: Insufficient documentation

## 2011-11-21 HISTORY — DX: Emphysema, unspecified: J43.9

## 2011-11-21 HISTORY — DX: Polyp of colon: K63.5

## 2011-11-21 HISTORY — DX: Polyneuropathy, unspecified: G62.9

## 2011-11-21 HISTORY — DX: Other chronic pain: G89.29

## 2011-11-21 HISTORY — DX: Personal history of other infectious and parasitic diseases: Z86.19

## 2011-11-21 HISTORY — DX: Other chronic pain: M54.9

## 2011-11-21 HISTORY — DX: Unspecified osteoarthritis, unspecified site: M19.90

## 2011-11-21 HISTORY — DX: Other amnesia: R41.3

## 2011-11-21 LAB — BASIC METABOLIC PANEL
GFR calc Af Amer: >90 mL/min (ref >90)
GFR calc non Af Amer: >90 mL/min (ref >90)
Potassium: 3.7 mEq/L (ref 3.5–5.1)
Sodium: 140 mEq/L (ref 135–145)

## 2011-11-21 LAB — APTT: aPTT: 38 seconds — ABNORMAL HIGH (ref 24–37)

## 2011-11-21 LAB — CBC
Hemoglobin: 15.6 g/dL (ref 13.0–17.0)
RBC: 4.78 MIL/uL (ref 4.22–5.81)

## 2011-11-21 LAB — SURGICAL PCR SCREEN: Staphylococcus aureus: NEGATIVE

## 2011-11-21 NOTE — Pre-Procedure Instructions (Signed)
[name]  11/21/2011   Your procedure is scheduled on:  Thurs, July 11 @ 9:30 AM  Report to [facility] at 6:30 AM.  Call this number if you have problems the morning of surgery: [phone]   Remember:   Do not eat food:After Midnight.  Take these medicines the morning of surgery with A SIP OF WATER: Keflex(Cephalexin),Flecainide(Tambocor),Pain Pill(if needed),Loratadine(Claritin),Omeprazole(Prilosec),and Propranalol(Inderal)   Do not wear jewelry  Do not wear lotions, powders, or cologne            Men may shave face and neck.  Do not bring valuables to the hospital.  Contacts, dentures or bridgework may not be worn into surgery.  Leave suitcase in the car. After surgery it may be brought to your room.  For patients admitted to the hospital, checkout time is 11:00 AM the day of discharge.   Patients discharged the day of surgery will not be allowed to drive home.  Special Instructions: CHG Shower Use Special Wash: 1/2 bottle night before surgery and 1/2 bottle morning of surgery.   Please read over the following fact sheets that you were given: Pain Booklet, Coughing and Deep Breathing, MRSA Information and Surgical Site Infection Prevention

## 2011-11-21 NOTE — Progress Notes (Signed)
Pt states that for the past week he has felt weak and tired;states that he goes through this every couple of months

## 2011-11-21 NOTE — Telephone Encounter (Signed)
Called spoke with patient who went to pre-admit for procedure on 7.11.13 and had some wheezing.  Having sinus congestion, PND, clear sinus drainage but coughing up clear to brown.  Reports this is "no worse than usual."  Ov with KC scheduled for tomorrow 7.9.13 @ 2:30pm.

## 2011-11-21 NOTE — Consult Note (Signed)
Anesthesia Chart Review:  Patient is a 56 year old male scheduled for exploration of cervical fusion with removal of Codman plating on 11/24/11 by Dr. Lovell Sheehan.   History includes smoking, COPD/emphysema, HTN, HLD, afib s/p DCCV 05/05/11, DM2, peripheral neuropathy, severe OSA without CPAP use, GERD, brain aneurysm '09.  PCP is Dr. Aida Puffer.  His Cardiologist is Dr. Antoine Poche.  He was last seen in February 2013.  He had a negative exercise treadmill stress test on 07/13/11 although he had submaximal exercise.    TEE on 05/05/11 showed: - Left ventricle: Systolic function was normal. The estimated ejection fraction was in the range of 55% to 60%. Wall motion was normal; there were no regional wall motion abnormalities. - Left atrium: No evidence of thrombus in the atrial cavity or appendage. - Atrial septum: No defect or patent foramen ovale was Identified. - Trivial MR/TR/PR.  EKG on 05/13/11 showed NSR, possible LAE.  CXR on 11/21/11 showed no acute cardiopulmonary abnormality.  Labs noted.  PLT 136.  PT/INR 19.5/1.62, PTT 38.  He'll need coags repeated on arrival.  I was asked to evaluate him by his PAT RN because he reported not feeling particularly well.  Primarily he feels tired, which is not really new for him.  Despite further discussion about importance of CPAP use, he says currently he can not afford a machine and the company has not yet been willing to work with him.  He denies chest pain or palpitations.  He does have chronic DOE and intermittent SOB at rest (none currently).  He overall feels at his baseline from both a cardiac and pulmonary standpoint.  He has had some sinus drainage, but no fevers or new cough.  Exam shows heart RRR, no murmur, lung with both inspiratory and expiratory wheezes throughout.  He did not appear in any acute distress.  O2 sats were 93% on RA.   BP 124/74, HR 72, R 20, T 36.3.  No carotid bruits or significant LE edema was noted.  Sclerae are injected  (chronic per patient).  He is not currently on any inhalers.  He thinks at least one has been prescribed in the past, but is not sure what he is suppose to be taking.  He says he has temporarily stopped smoking, but has not committed to fully quitting yet.    He has been evaluated by Pulmonologist Dr. Vassie Loll and Dr. Shelle Iron in the past.  He was most recently seen by Dr. Vassie Loll on 06/01/11 for OSA and emphysema follow-up.  He classified patient's emphysema as mild GOLD Stage 1.  He did not document any rales or rhonchi at that time.    As before, he did not appear to be in any acute distress but with finding of inspiratory and expiratory wheezing and history of severe OSA without CPAP use, I feel he should be re-evaluated by Pulmonology pre-operatively to optimize his pulmonary regimen.  I discussed likely need for use of CPAP during the peri-operative period.  Anesthesiologist Dr. Krista Blue agrees with plan.  Patient is scheduled to see Dr. Shelle Iron on 11/22/11.  Darl Pikes at Dr. Lovell Sheehan' office was updated.  Shonna Chock, PA-C 11/21/11 1606

## 2011-11-21 NOTE — Progress Notes (Addendum)
Cardiologist is Dr.Hochrein with last visit a couple of months ago and report in epic  Echo reports in epic from 2009/2011/2012 Heart cath in epic from 2008 Stress test results in epic Medical MD is Dr.James Little in [place]  EKG in epic from 05/13/11 Denies recent CXR  Sleep study done at [facility] about 2-32yrs ago-to request report

## 2011-11-22 ENCOUNTER — Ambulatory Visit (INDEPENDENT_AMBULATORY_CARE_PROVIDER_SITE_OTHER): Payer: BC Managed Care – PPO | Admitting: Pulmonary Disease

## 2011-11-22 ENCOUNTER — Encounter: Payer: Self-pay | Admitting: Pulmonary Disease

## 2011-11-22 VITALS — BP 122/78 | HR 76 | Temp 98.5°F | Ht 75.0 in | Wt 257.8 lb

## 2011-11-22 DIAGNOSIS — J45901 Unspecified asthma with (acute) exacerbation: Secondary | ICD-10-CM

## 2011-11-22 DIAGNOSIS — J45909 Unspecified asthma, uncomplicated: Secondary | ICD-10-CM

## 2011-11-22 DIAGNOSIS — G4733 Obstructive sleep apnea (adult) (pediatric): Secondary | ICD-10-CM

## 2011-11-22 DIAGNOSIS — J438 Other emphysema: Secondary | ICD-10-CM

## 2011-11-22 MED ORDER — PREDNISONE 10 MG PO TABS: ORAL_TABLET | ORAL | Status: DC

## 2011-11-22 NOTE — Addendum Note (Signed)
Addended by: Michel Bickers A on: 11/22/2011 04:20 PM   Modules accepted: Orders

## 2011-11-22 NOTE — Progress Notes (Signed)
  Subjective:    Patient ID: [name], male    DOB: [birth year], 56 y.o.   MRN: [id number]  HPI The patient comes in today for preoperative pulmonary clearance.  He has known mild COPD, but unfortunately continues to smoke.  He also has obstructive sleep apnea and has been prescribed a CPAP device.  Unfortunately, the patient is not using any inhalers, nor did he ever get his CPAP machine.  He is scheduled to have neck surgery this week, but is having increased shortness of breath along with chest congestion and wheezing.  He has occasional mucus production with purulence.   Review of Systems  Constitutional: Negative for fever and unexpected weight change.  HENT: Positive for congestion and postnasal drip. Negative for ear pain, nosebleeds, sore throat, rhinorrhea, sneezing, trouble swallowing, dental problem and sinus pressure.   Eyes: Negative for redness and itching.  Respiratory: Positive for cough and shortness of breath. Negative for chest tightness and wheezing.   Cardiovascular: Negative for palpitations and leg swelling.  Gastrointestinal: Negative for nausea and vomiting.  Genitourinary: Negative for dysuria.  Musculoskeletal: Positive for arthralgias. Negative for joint swelling.  Skin: Negative for rash.  Neurological: Negative for headaches.  Hematological: Does not bruise/bleed easily.  Psychiatric/Behavioral: Negative for dysphoric mood. The patient is not nervous/anxious.   All other systems reviewed and are negative.       Objective:   Physical Exam Obese male in no acute distress Nose without purulence or discharge noted Oropharynx clear Chest with diffuse wheezing and rhonchi throughout, and decreased breath sounds Cardiac exam with regular rate and rhythm Lower extremities with mild edema, no cyanosis. Alert and oriented, moves all 4 extremities.       Assessment & Plan:

## 2011-11-22 NOTE — Assessment & Plan Note (Signed)
The patient has active wheezing on exam today, and his history is most consistent with acute asthmatic bronchitis.  I have told him that he cannot have his neck surgery until this is resolved.  We'll treat him with a course of prednisone and, and also start on dulera.  The most important treatment here his total smoking cessation.

## 2011-11-22 NOTE — Patient Instructions (Addendum)
You must stop smoking.  This is the prime issue that is driving your breathing problems. Will treat with a course of prednisone to help with your wheezing and airway inflammation Will start on dulera 100/5  2 inhalations am and pm everyday!!  Rinse mouth well.  Can use albuterol as needed. You need to get on cpap for your osa.  This will lower your risks for surgery.  followup in 2 weeks with Dr. Vassie Loll to re-assess you before surgery.

## 2011-11-22 NOTE — Assessment & Plan Note (Signed)
The patient has not started on CPAP because of concerns over financial issues.  I did stress to him the importance of using the device leading up to his surgery, and will also need in the postoperative period.  I will send the patient to [facility] to try and get this arranged as ordered at the last visit.  Also encouraged him to work aggressively on weight loss.

## 2011-11-24 ENCOUNTER — Ambulatory Visit (HOSPITAL_COMMUNITY): Admission: RE | Admit: 2011-11-24 | Payer: BC Managed Care – PPO | Source: Ambulatory Visit | Admitting: Neurosurgery

## 2011-11-24 ENCOUNTER — Encounter (HOSPITAL_COMMUNITY): Admission: RE | Payer: Self-pay | Source: Ambulatory Visit

## 2011-11-24 SURGERY — ANTERIOR CERVICAL DECOMPRESSION/DISCECTOMY FUSION 1 LEVEL/HARDWARE REMOVAL
Anesthesia: General

## 2011-12-06 ENCOUNTER — Encounter: Payer: Self-pay | Admitting: Pulmonary Disease

## 2011-12-06 ENCOUNTER — Ambulatory Visit (INDEPENDENT_AMBULATORY_CARE_PROVIDER_SITE_OTHER): Payer: BC Managed Care – PPO | Admitting: Pulmonary Disease

## 2011-12-06 VITALS — BP 122/72 | HR 73 | Temp 98.5°F | Ht 75.0 in | Wt 259.6 lb

## 2011-12-06 DIAGNOSIS — J45901 Unspecified asthma with (acute) exacerbation: Secondary | ICD-10-CM

## 2011-12-06 DIAGNOSIS — G4733 Obstructive sleep apnea (adult) (pediatric): Secondary | ICD-10-CM

## 2011-12-06 DIAGNOSIS — J45909 Unspecified asthma, uncomplicated: Secondary | ICD-10-CM

## 2011-12-06 MED ORDER — HYDROCHLOROTHIAZIDE 25 MG PO TABS: 25.0000 mg | ORAL_TABLET | Freq: Every day | ORAL | Status: DC

## 2011-12-06 NOTE — Assessment & Plan Note (Signed)
He should be placed on CPAP during recovery form anesthesia & be compliant on CPAP while on pain/ sedating meds even after discharge. This was discussed with him.

## 2011-12-06 NOTE — Progress Notes (Signed)
  Subjective:    Patient ID: [name], male    DOB: 09/12/1955, 56 y.o.   MRN: [id number]  HPI PCP - Aida Puffer  56/M, smoker with atrial fibrillation for FU of COPD & sleep apnea.  He had atrial fibrillation treated with flecainide in the past. He had TEE/DCCV. He is maintaining sinus rhythm,is on anticoagulation. 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  He reports episodes of coughing which have made him 'pass out'. He reports sinus drainage for many years but has never seen ENT.  He does have severe sleep apnea but couldn't afford CPAP. Reviewed PSG from [facility] - severe with AHI 31.h corrected by CPAP 5 cm (wt was 250 lbs)  He smokes 1 PPD, drinks alcohol -'on weekends' - but I suspect more.   11/22/11 pre-op clearance for neck sx >> given course of prednisone for asthmatic bronchitis  dulera 100/5  Can use albuterol as needed.  Was set up with cpap  4-5 cigs/d, smoked 1/2 pack today due to stress CXR 11/21/11 no infx/ effusion He is not compliant with dulera, He has no income now & is unable to afford expensive meds Med review shows lisinopril & propranolol -which may be affecting his breathing    Review of Systems neg for any significant sore throat, dysphagia, itching, sneezing, nasal congestion or excess/ purulent secretions, fever, chills, sweats, unintended wt loss, pleuritic or exertional cp, hempoptysis, orthopnea pnd or change in chronic leg swelling. Also denies presyncope, palpitations, heartburn, abdominal pain, nausea, vomiting, diarrhea or change in bowel or urinary habits, dysuria,hematuria, rash, arthralgias, visual complaints, headache, numbness weakness or ataxia.     Objective:   Physical Exam  Gen. Pleasant, well-nourished, in no distress, normal affect ENT - no lesions, no post nasal drip, class 2 airway Neck: No JVD, no thyromegaly, no  carotid bruits Lungs: no use of accessory muscles, no dullness to percussion, decreased without rales or rhonchi  Cardiovascular: Rhythm regular, heart sounds  normal, no murmurs or gallops, no peripheral edema Abdomen: soft and non-tender, no hepatosplenomegaly, BS normal. Musculoskeletal: No deformities, no cyanosis or clubbing Neuro:  alert, non focal        Assessment & Plan:

## 2011-12-06 NOTE — Patient Instructions (Addendum)
STOP taking zestoretic Take HCTZ 25 mg daily instead I will ask your heart doctor to change propranolol to another medication You have to STOP smoking Stay on dulera 2 puffs twice daily until surgery  Take albuterol 2 puffs for emergency Stay on CPAP every night & right after surgery

## 2011-12-06 NOTE — Assessment & Plan Note (Addendum)
His acute episode has improved with steroids. Lisinopril& non selective BB propranolol are not ideal for him. STOP taking zestoretic -Take HCTZ 25 mg daily instead I will ask your heart doctor to change propranolol to another medication -prefer selective BB  You have to STOP smoking Stay on dulera 2 puffs twice daily until surgery  Take albuterol 2 puffs for emergency As such, he is not the best operative candidate due to his ongoing smokng & drinking . But objectively, his acute episode has improved. He is cleared for surgery from PULMONARY standpt  with due risk - he should be examined the day of surgery for bronchospasm - if he develops after, solumedrol 40 mg IV q 12h can be given

## 2011-12-12 ENCOUNTER — Telehealth: Payer: Self-pay | Admitting: Pulmonary Disease

## 2011-12-12 NOTE — Telephone Encounter (Signed)
I spoke with [name] and she gave me fax # [phone] to fax last OV note over. I have done this and nothing further was needed

## 2011-12-13 ENCOUNTER — Other Ambulatory Visit: Payer: Self-pay | Admitting: Neurosurgery

## 2011-12-21 ENCOUNTER — Encounter (HOSPITAL_COMMUNITY): Payer: Self-pay | Admitting: Respiratory Therapy

## 2011-12-23 ENCOUNTER — Encounter: Payer: Self-pay | Admitting: Internal Medicine

## 2011-12-23 ENCOUNTER — Ambulatory Visit (INDEPENDENT_AMBULATORY_CARE_PROVIDER_SITE_OTHER): Payer: BC Managed Care – PPO | Admitting: Internal Medicine

## 2011-12-23 VITALS — BP 120/64 | HR 70 | Temp 97.7°F | Ht 75.0 in | Wt 258.0 lb

## 2011-12-23 DIAGNOSIS — F17200 Nicotine dependence, unspecified, uncomplicated: Secondary | ICD-10-CM

## 2011-12-23 DIAGNOSIS — J449 Chronic obstructive pulmonary disease, unspecified: Secondary | ICD-10-CM

## 2011-12-23 DIAGNOSIS — Z720 Tobacco use: Secondary | ICD-10-CM

## 2011-12-23 DIAGNOSIS — I10 Essential (primary) hypertension: Secondary | ICD-10-CM

## 2011-12-23 MED ORDER — OMEPRAZOLE 20 MG PO CPDR: DELAYED_RELEASE_CAPSULE | ORAL | Status: DC

## 2011-12-23 MED ORDER — PREDNISONE (PAK) 10 MG PO TABS: ORAL_TABLET | ORAL | Status: AC

## 2011-12-23 NOTE — Progress Notes (Signed)
  Subjective:    Patient ID: [name], male    DOB: 02-17-1956, 56 y.o.   MRN: [id number]  HPI PCP - Aida Puffer  56/M, smoker with atrial fibrillation for FU of COPD & sleep apnea.  He had atrial fibrillation treated with flecainide in the past. He had TEE/DCCV. He is maintaining sinus rhythm,is on anticoagulation. 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  He reports episodes of coughing which have made him 'pass out'. He reports sinus drainage for many years but has never seen ENT.  He does have severe sleep apnea but couldn't afford CPAP. Reviewed PSG from [facility] - severe with AHI 31.h corrected by CPAP 5 cm (wt was 250 lbs)  He smokes 1 PPD, drinks alcohol -'on weekends' - but I suspect more.   11/22/11 pre-op clearance for neck sx >> given course of prednisone for asthmatic bronchitis  dulera 100/5  Can use albuterol as needed.  Was set up with cpap  4-5 cigs/d, smoked 1/2 pack today due to stress CXR 11/21/11 no infx/ effusion He is not compliant with dulera, He has no income now & is unable to afford expensive meds Med review shows lisinopril & propranolol -which may be affecting his breathing rec STOP taking zestoretic Take HCTZ 25 mg daily instead I will ask your heart doctor to change propranolol to another medication You have to STOP smoking Stay on dulera 2 puffs twice daily until surgery  Take albuterol 2 puffs for emergency Stay on CPAP every night & right after surgery   12/23/2011 ov/acute work in [place], still smoking, lost instructions and may still be on ace, very hoarse, inhalers not helping.  No unusual cough, purulent sputum or sinus/hb symptoms on present rx. Chest feels generally tighter x 2 weeks and difficulty lying down and night due to sob.    denies any obvious fluctuation of symptoms with weather or environmental changes or other aggravating  or alleviating factors except as outlined above   ROS  The following are not active complaints unless bolded sore throat, dysphagia, dental problems, itching, sneezing,  nasal congestion or excess/ purulent secretions, ear ache,   fever, chills, sweats, unintended wt loss, pleuritic or exertional cp, hemoptysis,  orthopnea pnd or leg swelling, presyncope, palpitations, heartburn, abdominal pain, anorexia, nausea, vomiting, diarrhea  or change in bowel or urinary habits, change in stools or urine, dysuria,hematuria,  rash, arthralgias, visual complaints, headache, numbness weakness or ataxia or problems with walking or coordination,  change in mood/affect or memory.            Objective:   Physical Exam  Gen. Pleasant, well-nourished, in no distress, normal affect/ classic pseudowheeze ENT - no lesions, no post nasal drip, class 2 airway Neck: No JVD, no thyromegaly, no carotid bruits Lungs: no use of accessory muscles, no dullness to percussion, distant exp wheeze bilaterally Cardiovascular: Rhythm regular, heart sounds  normal, no murmurs or gallops, no peripheral edema Abdomen: soft and non-tender, no hepatosplenomegaly, BS normal. Musculoskeletal: No deformities, no cyanosis or clubbing Neuro:  alert, non focal        Assessment & Plan:

## 2011-12-23 NOTE — Patient Instructions (Addendum)
STOP taking zestoretic Take HCTZ 25 mg daily instead  change propranolol to Bystolic 10 mg one dialy  You have to STOP smoking before smoking stops you Stay on dulera 2 puffs in am and 2 puffs in pm  Take albuterol 2 puffs for emergency Prednisone 10 mg take  4 each am x 2 days,   2 each am x 2 days,  1 each am x2days and stop  Stop fish oil for now  See Tammy NP w/in 2 weeks with all your medications, even over the counter meds, separated in two separate bags, the ones you take no matter what vs the ones you stop once you feel better and take only as needed when you feel you need them.   Tammy  will generate for you a new user friendly medication calendar that will put [place] all on the same page re: your medication use.     Without this process, it simply isn't possible to assure that we are providing  your outpatient care  with  the attention to detail we feel you deserve.   If we cannot assure that you're getting that kind of care,  then we cannot manage your problem effectively from this clinic.  Once you have seen Tammy and we are sure that we're all on the same page with your medication use she will arrange follow up with [facility].

## 2011-12-24 NOTE — Assessment & Plan Note (Signed)
>   3 min I reviewed the Flethcher curve with patient that basically indicates  if you quit smoking when your best day FEV1 is still well preserved(a his is reported to be) it is highly unlikely you will progress to severe disease and informed the patient there was no medication on the market that has proven to change the curve or the likelihood of progression.  Therefore stopping smoking and maintaining abstinence is the most important aspect of care, not choice of inhalers or for that matter, doctors.

## 2011-12-24 NOTE — Assessment & Plan Note (Addendum)
Symptoms are markedly disproportionate to objective findings and not clear this is all a lung problem but pt does appear to have difficult airway management issues.  DDX of  difficult airways managment all start with A and  include Adherence, Ace Inhibitors, Acid Reflux, Active Sinus Disease, Alpha 1 Antitripsin deficiency, Anxiety masquerading as Airways dz,  ABPA,  allergy(esp in young), Aspiration (esp in elderly), Adverse effects of DPI,  Active smokers, plus two Bs  = Bronchiectasis and Beta blocker use..and one C= CHF  Adherence is always the initial "prime suspect" and is a multilayered concern that requires a "trust but verify" approach in every patient - starting with knowing how to use medications, especially inhalers, correctly, keeping up with refills and understanding the fundamental difference between maintenance and prns vs those medications only taken for a very short course and then stopped and not refilled. He has no clue what meds he's taking and I frankly told him his life was a risk because of this.    ? acei > has a pseudowheeze component classic for acei effect > needs to stop  ? Beta blocker effect > Strongly prefer in this setting: Bystolic, the most beta -1  selective Beta blocker available in sample form, with bisoprolol the most selective generic choice  on the market.    Active smoking discussed separately  The proper method of use, as well as anticipated side effects, of a metered-dose inhaler are discussed and demonstrated to the patient. Improved effectiveness after extensive coaching during this visit to a level of approximately  90%

## 2011-12-24 NOTE — Assessment & Plan Note (Addendum)
ACE inhibitors are problematic in  pts with airway complaints because  even experienced pulmonologists can't always distinguish ace effects from copd/asthma.  By themselves they don't actually cause a problem, much like oxygen can't by itself start a fire, but they certainly serve as a powerful catalyst or enhancer for any "fire"  or inflammatory process in the upper airway, be it caused by an ET  tube or more commonly reflux (especially in the obese or pts with known GERD or who are on biphoshonates).  Not clear at this point if he even takes acei but given he's considering et/neck surgery and already has upper airway issues I would avoid this class completely.       Also re inderal:  Strongly prefer in this setting: Bystolic, the most beta -1  selective Beta blocker available in sample form, with bisoprolol the most selective generic choice  on the market.

## 2011-12-26 NOTE — Consult Note (Addendum)
Anesthesia Chart Review:  Patient is a 56 year old male who was scheduled for exploration of cervical fusion with removal of Codman plating on 11/24/11 by Dr. Lovell Sheehan.  It was canceled because he presented with wheezing at his PAT appointment.  Anesthesia referred him back to Pulmonology who diagnosed him with acute asthmatic bronchitis and recommended surgery be delayed until it was resolved.  Surgery has now been rescheduled for 01/04/12.  His PAT appointment is on 12/30/11.  Other history includes smoking, COPD/emphysema, HTN, HLD, afib s/p DCCV 05/05/11, DM2, peripheral neuropathy, severe OSA without CPAP use, GERD, brain aneurysm '09. PCP is Dr. Aida Puffer.   His Cardiologist is Dr. Antoine Poche. He was last seen in February 2013. He had a negative exercise treadmill stress test on 07/13/11 although he had submaximal exercise.  Dr. Antoine Poche did complete a note of cardiac clearance for this procedure.  TEE on 05/05/11 showed:  - Left ventricle: Systolic function was normal. The estimated ejection fraction was in the range of 55% to 60%. Wall motion was normal; there were no regional wall motion abnormalities. - Left atrium: No evidence of thrombus in the atrial cavity or appendage. - Atrial septum: No defect or patent foramen ovale was Identified.  - Trivial MR/TR/PR.  EKG on 05/13/11 showed NSR, possible LAE.   CXR on 11/21/11 showed no acute cardiopulmonary abnormality.  After his asthmatic bronchitis was treated by Dr. Shelle Iron, he was seen in follow-up by Pulmonologist Dr. Vassie Loll on 12/06/11.  His recommendations/clearance is as follows: "He is not the best operative candidate due to his ongoing smokng & drinking . But objectively, his acute episode has improved. He is cleared for surgery from PULMONARY standpt with due risk - he should be examined the day of surgery for bronchospasm - if he develops after, solumedrol 40 mg IV q 12h can be given."  His PAT visit is on 12/30/11 @ 0900.  I'd like to  talk with him then to ensure his pulmonary status is stable.  Shonna Chock, PA-C 12/23/11 2440  Addendum: 12/30/11 1715 I spoke with Mr. Antosh during his PAT visit.  He was cleared by Dr. Vassie Loll on 12/06/11.  However, he had recurrent shortness of breath on 12/23/11 and saw Dr. Sherene Sires.  Apparently, he felt that at least some of Mr. Altier symptoms were triggered by propranolol.  He recommended changing him to a more selective B-blocker (Bystolic) and also avoiding ACEI in the future.  Mr. Marcello says that making this medication adjustment has made a significant improvement in his symptoms.  He denies SOB and has had much more energy.  Unfortunately, he continues to smoke.  He is also on an albuterol MDI and Dulera.  His lungs sounds were slightly coarse in the bases today, but no clear wheezing.  I encouraged him to use all his COPD/asthma inhalers as prescribed on the morning of his surgery.  His PT/PTT from today are elevated, and will be repeated on the day of surgery.  His Coumadin is now on hold.  If he has no further respiratory exacerbations prior to surgery and his follow-up coags are reasonable, then anticipate he can proceed.

## 2011-12-30 ENCOUNTER — Encounter (HOSPITAL_COMMUNITY)
Admission: RE | Admit: 2011-12-30 | Discharge: 2011-12-30 | Disposition: A | Discharge status: Home / Self Care | Payer: BC Managed Care – PPO | Source: Ambulatory Visit | Attending: Neurosurgery | Admitting: Neurosurgery

## 2011-12-30 ENCOUNTER — Encounter (HOSPITAL_COMMUNITY): Payer: Self-pay

## 2011-12-30 HISTORY — DX: Unspecified asthma, uncomplicated: J45.909

## 2011-12-30 LAB — CBC
Hemoglobin: 14.7 g/dL (ref 13.0–17.0)
MCHC: 33.5 g/dL (ref 30.0–36.0)
Platelets: 143 10*3/uL — ABNORMAL LOW (ref 150–400)
RDW: 14.5 % (ref 11.5–15.5)

## 2011-12-30 LAB — BASIC METABOLIC PANEL
CO2: 31 mEq/L (ref 19–32)
Chloride: 100 mEq/L (ref 96–112)
GFR calc Af Amer: >90 mL/min (ref >90)
GFR calc non Af Amer: >90 mL/min (ref >90)
Glucose, Bld: 179 mg/dL — ABNORMAL HIGH (ref 70–99)
Sodium: 142 mEq/L (ref 135–145)

## 2011-12-30 LAB — APTT: aPTT: 52 seconds — ABNORMAL HIGH (ref 24–37)

## 2011-12-30 LAB — SURGICAL PCR SCREEN: MRSA, PCR: NEGATIVE

## 2011-12-30 LAB — PROTIME-INR
INR: 3.06 — ABNORMAL HIGH (ref 0.00–1.49)
Prothrombin Time: 32.1 seconds — ABNORMAL HIGH (ref 11.6–15.2)

## 2011-12-30 NOTE — Pre-Procedure Instructions (Addendum)
[address]  12/30/2011   Your procedure is scheduled on:  Wednesday January 04, 2012.  Report to [facility] at 1030 AM.  Call this number if you have problems the morning of surgery: [phone]   Remember:   Do not eat food or drink:After Midnight.   Take these medicines the morning of surgery with A SIP OF WATER: Albuterol inhaler if needed for shortness of breath, Flecainide (Tambocor), Dulera inhaler, Omeprazole (Prilosec), Oxycodone (Percoect) if needed for pain, and Propranolol (Inderal)   Hold all medications for diabetes the morning of surgery   Do not wear jewelry  Do not wear lotions or colognes.  Men may shave face and neck.  Do not bring valuables to the hospital.  Contacts, dentures or bridgework may not be worn into surgery.  Leave suitcase in the car. After surgery it may be brought to your room.  For patients admitted to the hospital, checkout time is 11:00 AM the day of discharge.   Patients discharged the day of surgery will not be allowed to drive home.  Name and phone number of your driver:   Special Instructions: CHG Shower Use Special Wash: 1/2 bottle night before surgery and 1/2 bottle morning of surgery.   Please read over the following fact sheets that you were given: Pain Booklet, Coughing and Deep Breathing, MRSA Information and Surgical Site Infection Prevention

## 2011-12-30 NOTE — Progress Notes (Signed)
Patient denied having any shortness of breath. Revonda Standard, [place] saw patient while in waiting area. Patient informed Nurse that Propranolol was changed to Bystolic 10 mg. Medications changed in computer and patient was instructed to take Bystolic the morning of surgery. Patient verbalized understanding.

## 2012-01-03 MED ORDER — CEFAZOLIN SODIUM 1-5 GM-% IV SOLN: 1.0000 g | INTRAVENOUS | Status: DC

## 2012-01-03 MED ORDER — CEFAZOLIN SODIUM-DEXTROSE 2-3 GM-% IV SOLR
2.0000 g | INTRAVENOUS | Status: DC
  Filled 2012-01-03: qty 50

## 2012-01-04 ENCOUNTER — Ambulatory Visit (HOSPITAL_COMMUNITY)
Admission: RE | Admit: 2012-01-04 | Discharge: 2012-01-04 | Disposition: A | Discharge status: Home / Self Care | Payer: BC Managed Care – PPO | Source: Ambulatory Visit | Attending: Neurosurgery | Admitting: Neurosurgery

## 2012-01-04 ENCOUNTER — Encounter (HOSPITAL_COMMUNITY)
Admission: RE | Disposition: A | Discharge status: Home / Self Care | Payer: Self-pay | Source: Ambulatory Visit | Attending: Neurosurgery

## 2012-01-04 ENCOUNTER — Encounter (HOSPITAL_COMMUNITY): Payer: Self-pay | Admitting: Vascular Surgery

## 2012-01-04 ENCOUNTER — Ambulatory Visit (HOSPITAL_COMMUNITY): Payer: BC Managed Care – PPO

## 2012-01-04 ENCOUNTER — Ambulatory Visit (HOSPITAL_COMMUNITY): Payer: BC Managed Care – PPO | Admitting: Vascular Surgery

## 2012-01-04 DIAGNOSIS — K219 Gastro-esophageal reflux disease without esophagitis: Secondary | ICD-10-CM | POA: Insufficient documentation

## 2012-01-04 DIAGNOSIS — Z01812 Encounter for preprocedural laboratory examination: Secondary | ICD-10-CM | POA: Insufficient documentation

## 2012-01-04 DIAGNOSIS — F17200 Nicotine dependence, unspecified, uncomplicated: Secondary | ICD-10-CM | POA: Insufficient documentation

## 2012-01-04 DIAGNOSIS — I4891 Unspecified atrial fibrillation: Secondary | ICD-10-CM | POA: Insufficient documentation

## 2012-01-04 DIAGNOSIS — G4733 Obstructive sleep apnea (adult) (pediatric): Secondary | ICD-10-CM | POA: Insufficient documentation

## 2012-01-04 DIAGNOSIS — Z981 Arthrodesis status: Secondary | ICD-10-CM | POA: Insufficient documentation

## 2012-01-04 DIAGNOSIS — M5020 Other cervical disc displacement, unspecified cervical region: Principal | ICD-10-CM | POA: Insufficient documentation

## 2012-01-04 DIAGNOSIS — E119 Type 2 diabetes mellitus without complications: Secondary | ICD-10-CM | POA: Insufficient documentation

## 2012-01-04 DIAGNOSIS — I10 Essential (primary) hypertension: Secondary | ICD-10-CM | POA: Insufficient documentation

## 2012-01-04 DIAGNOSIS — E669 Obesity, unspecified: Secondary | ICD-10-CM | POA: Insufficient documentation

## 2012-01-04 DIAGNOSIS — J449 Chronic obstructive pulmonary disease, unspecified: Secondary | ICD-10-CM | POA: Insufficient documentation

## 2012-01-04 DIAGNOSIS — J4489 Other specified chronic obstructive pulmonary disease: Secondary | ICD-10-CM | POA: Insufficient documentation

## 2012-01-04 HISTORY — PX: ANTERIOR CERVICAL DECOMP/DISCECTOMY FUSION: SHX1161

## 2012-01-04 LAB — PROTIME-INR
INR: 1.1 (ref 0.00–1.49)
Prothrombin Time: 14.4 seconds (ref 11.6–15.2)

## 2012-01-04 SURGERY — ANTERIOR CERVICAL DECOMPRESSION/DISCECTOMY FUSION 1 LEVEL/HARDWARE REMOVAL
Anesthesia: General | Site: Neck | Wound class: Clean

## 2012-01-04 MED ORDER — DEXTROSE 5 % IV SOLN
INTRAVENOUS | Status: DC | PRN
  Administered 2012-01-04: 13:00:00 via INTRAVENOUS

## 2012-01-04 MED ORDER — ZOLPIDEM TARTRATE 5 MG PO TABS: 5.0000 mg | ORAL_TABLET | Freq: Every evening | ORAL | Status: DC | PRN

## 2012-01-04 MED ORDER — SIMVASTATIN 40 MG PO TABS
40.0000 mg | ORAL_TABLET | Freq: Every day | ORAL | Status: DC
  Administered 2012-01-04: 40 mg via ORAL
  Filled 2012-01-04: qty 1

## 2012-01-04 MED ORDER — SODIUM CHLORIDE 0.9 % IV SOLN
10.0000 mg | INTRAVENOUS | Status: DC | PRN
  Administered 2012-01-04: 50 ug/min via INTRAVENOUS

## 2012-01-04 MED ORDER — PANTOPRAZOLE SODIUM 40 MG PO TBEC: 40.0000 mg | DELAYED_RELEASE_TABLET | Freq: Every day | ORAL | Status: DC

## 2012-01-04 MED ORDER — LACTATED RINGERS IV SOLN: INTRAVENOUS | Status: DC

## 2012-01-04 MED ORDER — FLECAINIDE ACETATE 100 MG PO TABS
100.0000 mg | ORAL_TABLET | Freq: Two times a day (BID) | ORAL | Status: DC
  Administered 2012-01-04: 100 mg via ORAL
  Filled 2012-01-04: qty 1

## 2012-01-04 MED ORDER — INSULIN ASPART 100 UNIT/ML ~~LOC~~ SOLN
0.0000 [IU] | SUBCUTANEOUS | Status: DC
  Administered 2012-01-04: 4 [IU] via SUBCUTANEOUS

## 2012-01-04 MED ORDER — PHENOL 1.4 % MT LIQD: 1.0000 | OROMUCOSAL | Status: DC | PRN

## 2012-01-04 MED ORDER — MENTHOL 3 MG MT LOZG: 1.0000 | LOZENGE | OROMUCOSAL | Status: DC | PRN

## 2012-01-04 MED ORDER — MOMETASONE FURO-FORMOTEROL FUM 100-5 MCG/ACT IN AERO
2.0000 | INHALATION_SPRAY | Freq: Every day | RESPIRATORY_TRACT | Status: DC
  Filled 2012-01-04: qty 13

## 2012-01-04 MED ORDER — BACITRACIN ZINC 500 UNIT/GM EX OINT
TOPICAL_OINTMENT | CUTANEOUS | Status: DC | PRN
  Administered 2012-01-04: 1 via TOPICAL

## 2012-01-04 MED ORDER — DEXAMETHASONE SODIUM PHOSPHATE 4 MG/ML IJ SOLN
2.0000 mg | Freq: Four times a day (QID) | INTRAMUSCULAR | Status: DC
  Administered 2012-01-04: 2 mg via INTRAVENOUS
  Filled 2012-01-04: qty 1

## 2012-01-04 MED ORDER — BACITRACIN 50000 UNITS IM SOLR
INTRAMUSCULAR | Status: AC
  Filled 2012-01-04: qty 1

## 2012-01-04 MED ORDER — BUPIVACAINE-EPINEPHRINE 0.5% -1:200000 IJ SOLN
INTRAMUSCULAR | Status: DC | PRN
  Administered 2012-01-04: 30 mL

## 2012-01-04 MED ORDER — ONDANSETRON HCL 4 MG/2ML IJ SOLN: 4.0000 mg | INTRAMUSCULAR | Status: DC | PRN

## 2012-01-04 MED ORDER — MORPHINE SULFATE 2 MG/ML IJ SOLN: 1.0000 mg | INTRAMUSCULAR | Status: DC | PRN

## 2012-01-04 MED ORDER — HEMOSTATIC AGENTS (NO CHARGE) OPTIME
TOPICAL | Status: DC | PRN
  Administered 2012-01-04: 1 via TOPICAL

## 2012-01-04 MED ORDER — DEXAMETHASONE SODIUM PHOSPHATE 4 MG/ML IJ SOLN
INTRAMUSCULAR | Status: DC | PRN
  Administered 2012-01-04: 8 mg via INTRAVENOUS

## 2012-01-04 MED ORDER — FENTANYL CITRATE 0.05 MG/ML IJ SOLN
INTRAMUSCULAR | Status: DC | PRN
  Administered 2012-01-04: 100 ug via INTRAVENOUS
  Administered 2012-01-04: 150 ug via INTRAVENOUS
  Administered 2012-01-04 (×2): 50 ug via INTRAVENOUS

## 2012-01-04 MED ORDER — SODIUM CHLORIDE 0.9 % IR SOLN
Status: DC | PRN
  Administered 2012-01-04: 13:00:00

## 2012-01-04 MED ORDER — ROCURONIUM BROMIDE 100 MG/10ML IV SOLN
INTRAVENOUS | Status: DC | PRN
  Administered 2012-01-04: 10 mg via INTRAVENOUS
  Administered 2012-01-04: 20 mg via INTRAVENOUS
  Administered 2012-01-04: 40 mg via INTRAVENOUS

## 2012-01-04 MED ORDER — HYDROCODONE-ACETAMINOPHEN 5-325 MG PO TABS: 1.0000 | ORAL_TABLET | ORAL | Status: DC | PRN

## 2012-01-04 MED ORDER — LIDOCAINE HCL (CARDIAC) 20 MG/ML IV SOLN
INTRAVENOUS | Status: DC | PRN
  Administered 2012-01-04: 95 mg via INTRAVENOUS

## 2012-01-04 MED ORDER — SODIUM CHLORIDE 0.9 % IV SOLN
INTRAVENOUS | Status: AC
  Filled 2012-01-04: qty 500

## 2012-01-04 MED ORDER — GLYCOPYRROLATE 0.2 MG/ML IJ SOLN
INTRAMUSCULAR | Status: DC | PRN
  Administered 2012-01-04 (×2): 0.4 mg via INTRAVENOUS

## 2012-01-04 MED ORDER — ACETAMINOPHEN 325 MG PO TABS: 650.0000 mg | ORAL_TABLET | ORAL | Status: DC | PRN

## 2012-01-04 MED ORDER — CEFAZOLIN SODIUM 1-5 GM-% IV SOLN
INTRAVENOUS | Status: AC
  Administered 2012-01-04: 3 g via INTRAVENOUS
  Filled 2012-01-04: qty 50

## 2012-01-04 MED ORDER — GLIMEPIRIDE 1 MG PO TABS
1.0000 mg | ORAL_TABLET | Freq: Every day | ORAL | Status: DC
  Filled 2012-01-04: qty 1

## 2012-01-04 MED ORDER — LIDOCAINE HCL 4 % MT SOLN
OROMUCOSAL | Status: DC | PRN
  Administered 2012-01-04: 4 mL via TOPICAL

## 2012-01-04 MED ORDER — METFORMIN HCL 500 MG PO TABS
500.0000 mg | ORAL_TABLET | Freq: Every day | ORAL | Status: DC
  Filled 2012-01-04: qty 1

## 2012-01-04 MED ORDER — HYDROCHLOROTHIAZIDE 25 MG PO TABS
25.0000 mg | ORAL_TABLET | Freq: Every day | ORAL | Status: DC
  Administered 2012-01-04: 25 mg via ORAL
  Filled 2012-01-04: qty 1

## 2012-01-04 MED ORDER — THROMBIN 5000 UNITS EX SOLR
CUTANEOUS | Status: DC | PRN
  Administered 2012-01-04 (×2): 5000 [IU] via TOPICAL

## 2012-01-04 MED ORDER — DIAZEPAM 5 MG PO TABS: 5.0000 mg | ORAL_TABLET | Freq: Four times a day (QID) | ORAL | Status: DC | PRN

## 2012-01-04 MED ORDER — SUCCINYLCHOLINE CHLORIDE 20 MG/ML IJ SOLN
INTRAMUSCULAR | Status: DC | PRN
  Administered 2012-01-04: 100 mg via INTRAVENOUS

## 2012-01-04 MED ORDER — ONDANSETRON HCL 4 MG/2ML IJ SOLN: 4.0000 mg | Freq: Once | INTRAMUSCULAR | Status: DC | PRN

## 2012-01-04 MED ORDER — NEBIVOLOL HCL 10 MG PO TABS
10.0000 mg | ORAL_TABLET | Freq: Every day | ORAL | Status: DC
  Filled 2012-01-04: qty 1

## 2012-01-04 MED ORDER — NEOSTIGMINE METHYLSULFATE 1 MG/ML IJ SOLN
INTRAMUSCULAR | Status: DC | PRN
  Administered 2012-01-04: 2 mg via INTRAVENOUS
  Administered 2012-01-04: 3 mg via INTRAVENOUS

## 2012-01-04 MED ORDER — ALBUTEROL SULFATE HFA 108 (90 BASE) MCG/ACT IN AERS
2.0000 | INHALATION_SPRAY | Freq: Four times a day (QID) | RESPIRATORY_TRACT | Status: DC | PRN
  Filled 2012-01-04: qty 6.7

## 2012-01-04 MED ORDER — 0.9 % SODIUM CHLORIDE (POUR BTL) OPTIME
TOPICAL | Status: DC | PRN
  Administered 2012-01-04: 1000 mL

## 2012-01-04 MED ORDER — CEFAZOLIN SODIUM-DEXTROSE 2-3 GM-% IV SOLR
2.0000 g | Freq: Three times a day (TID) | INTRAVENOUS | Status: DC
  Administered 2012-01-04: 2 g via INTRAVENOUS
  Filled 2012-01-04 (×2): qty 50

## 2012-01-04 MED ORDER — LACTATED RINGERS IV SOLN
INTRAVENOUS | Status: DC | PRN
  Administered 2012-01-04 (×3): via INTRAVENOUS

## 2012-01-04 MED ORDER — ACETAMINOPHEN 650 MG RE SUPP: 650.0000 mg | RECTAL | Status: DC | PRN

## 2012-01-04 MED ORDER — DOCUSATE SODIUM 100 MG PO CAPS
100.0000 mg | ORAL_CAPSULE | Freq: Two times a day (BID) | ORAL | Status: DC
  Administered 2012-01-04: 100 mg via ORAL
  Filled 2012-01-04: qty 1

## 2012-01-04 MED ORDER — PROPOFOL 10 MG/ML IV EMUL
INTRAVENOUS | Status: DC | PRN
  Administered 2012-01-04 (×2): 200 mg via INTRAVENOUS

## 2012-01-04 MED ORDER — OXYCODONE HCL 5 MG PO TABS: 15.0000 mg | ORAL_TABLET | ORAL | Status: DC | PRN

## 2012-01-04 MED ORDER — HYDROMORPHONE HCL PF 1 MG/ML IJ SOLN: 0.2500 mg | INTRAMUSCULAR | Status: DC | PRN

## 2012-01-04 MED ORDER — ONDANSETRON HCL 4 MG/2ML IJ SOLN
INTRAMUSCULAR | Status: DC | PRN
  Administered 2012-01-04: 4 mg via INTRAVENOUS

## 2012-01-04 SURGICAL SUPPLY — 64 items
BAG DECANTER FOR FLEXI CONT (MISCELLANEOUS) ×2 IMPLANT
BENZOIN TINCTURE PRP APPL 2/3 (GAUZE/BANDAGES/DRESSINGS) ×2 IMPLANT
BIT DRILL SRG 14X2.2XFLT CHK (BIT) ×1 IMPLANT
BIT DRL SRG 14X2.2XFLT CHK (BIT) ×1
BLADE SURG 15 STRL LF DISP TIS (BLADE) ×2 IMPLANT
BLADE SURG 15 STRL SS (BLADE) ×2
BLADE ULTRA TIP 2M (BLADE) ×2 IMPLANT
BRUSH SCRUB EZ PLAIN DRY (MISCELLANEOUS) ×2 IMPLANT
BUR BARREL STRAIGHT FLUTE 4.0 (BURR) ×2 IMPLANT
BUR MATCHSTICK NEURO 3.0 LAGG (BURR) ×2 IMPLANT
CANISTER SUCTION 2500CC (MISCELLANEOUS) ×2 IMPLANT
CLOTH BEACON ORANGE TIMEOUT ST (SAFETY) ×2 IMPLANT
CONT SPEC 4OZ CLIKSEAL STRL BL (MISCELLANEOUS) ×4 IMPLANT
COVER MAYO STAND STRL (DRAPES) ×2 IMPLANT
DRAPE LAPAROTOMY 100X72 PEDS (DRAPES) ×2 IMPLANT
DRAPE MICROSCOPE LEICA (MISCELLANEOUS) ×2 IMPLANT
DRAPE POUCH INSTRU U-SHP 10X18 (DRAPES) ×2 IMPLANT
DRAPE SURG 17X23 STRL (DRAPES) ×4 IMPLANT
DRILL BIT SKYLINE 14MM (BIT) ×1
ELECT REM PT RETURN 9FT ADLT (ELECTROSURGICAL) ×2
ELECTRODE REM PT RTRN 9FT ADLT (ELECTROSURGICAL) ×1 IMPLANT
GAUZE SPONGE 4X4 16PLY XRAY LF (GAUZE/BANDAGES/DRESSINGS) IMPLANT
GLOVE BIO SURGEON STRL SZ8.5 (GLOVE) ×2 IMPLANT
GLOVE BIOGEL PI IND STRL 7.5 (GLOVE) ×1 IMPLANT
GLOVE BIOGEL PI IND STRL 8 (GLOVE) ×4 IMPLANT
GLOVE BIOGEL PI IND STRL 8.5 (GLOVE) ×1 IMPLANT
GLOVE BIOGEL PI INDICATOR 7.5 (GLOVE) ×1
GLOVE BIOGEL PI INDICATOR 8 (GLOVE) ×4
GLOVE BIOGEL PI INDICATOR 8.5 (GLOVE) ×1
GLOVE EXAM NITRILE LRG STRL (GLOVE) IMPLANT
GLOVE EXAM NITRILE MD LF STRL (GLOVE) ×2 IMPLANT
GLOVE EXAM NITRILE XL STR (GLOVE) IMPLANT
GLOVE EXAM NITRILE XS STR PU (GLOVE) IMPLANT
GLOVE SS BIOGEL STRL SZ 8 (GLOVE) ×1 IMPLANT
GLOVE SUPERSENSE BIOGEL SZ 8 (GLOVE) ×1
GOWN BRE IMP SLV AUR LG STRL (GOWN DISPOSABLE) IMPLANT
GOWN BRE IMP SLV AUR XL STRL (GOWN DISPOSABLE) ×6 IMPLANT
GOWN STRL REIN 2XL LVL4 (GOWN DISPOSABLE) ×2 IMPLANT
KIT BASIN OR (CUSTOM PROCEDURE TRAY) ×2 IMPLANT
KIT ROOM TURNOVER OR (KITS) ×2 IMPLANT
MARKER SKIN DUAL TIP RULER LAB (MISCELLANEOUS) ×2 IMPLANT
NEEDLE HYPO 22GX1.5 SAFETY (NEEDLE) ×2 IMPLANT
NEEDLE SPNL 18GX3.5 QUINCKE PK (NEEDLE) ×2 IMPLANT
NS IRRIG 1000ML POUR BTL (IV SOLUTION) ×2 IMPLANT
PACK LAMINECTOMY NEURO (CUSTOM PROCEDURE TRAY) ×2 IMPLANT
PEEK VISTA 14X14X8MM (Peek) ×2 IMPLANT
PIN DISTRACTION 14MM (PIN) ×4 IMPLANT
PLATE ONE LEVEL SKYLINE 16MM (Plate) ×2 IMPLANT
PUTTY ABX ACTIFUSE 1.5ML (Putty) ×4 IMPLANT
RUBBERBAND STERILE (MISCELLANEOUS) ×4 IMPLANT
SCREW SKYLINE VAR OS 14MM (Screw) ×2 IMPLANT
SCREW VAR SELF TAP SKYLINE 14M (Screw) ×6 IMPLANT
SPONGE GAUZE 4X4 12PLY (GAUZE/BANDAGES/DRESSINGS) ×2 IMPLANT
SPONGE INTESTINAL PEANUT (DISPOSABLE) ×8 IMPLANT
SPONGE SURGIFOAM ABS GEL SZ50 (HEMOSTASIS) ×2 IMPLANT
STRIP CLOSURE SKIN 1/2X4 (GAUZE/BANDAGES/DRESSINGS) ×2 IMPLANT
SUT VIC AB 0 CT1 27 (SUTURE) ×1
SUT VIC AB 0 CT1 27XBRD ANTBC (SUTURE) ×1 IMPLANT
SUT VIC AB 3-0 SH 8-18 (SUTURE) ×2 IMPLANT
SYR 20ML ECCENTRIC (SYRINGE) ×2 IMPLANT
TAPE CLOTH SURG 4X10 WHT LF (GAUZE/BANDAGES/DRESSINGS) ×2 IMPLANT
TOWEL OR 17X24 6PK STRL BLUE (TOWEL DISPOSABLE) ×2 IMPLANT
TOWEL OR 17X26 10 PK STRL BLUE (TOWEL DISPOSABLE) ×2 IMPLANT
WATER STERILE IRR 1000ML POUR (IV SOLUTION) ×2 IMPLANT

## 2012-01-04 NOTE — Progress Notes (Signed)
Pt. Tolerated procedure well. Pt. Alert and oriented,follows simple instructions, denies pain. Incision area without swelling, redness or S/S of infection. Voiding adequate clear yellow urine. Moving all extremities well and vitals stable and documented. Anterior Cervical surgery notes instructions given to patient and family member for home safety and precautions.Pt and family stated understanding of instructions given. Patient notified nurse that MD told him to start taking Coumadin tonight and RN told patient's family to call MD in AM to clarify as stated by MD on call to nurse

## 2012-01-04 NOTE — Transfer of Care (Signed)
Immediate Anesthesia Transfer of Care Note  Patient: [name]  Procedure(s) Performed: Procedure(s) (LRB): ANTERIOR CERVICAL DECOMPRESSION/DISCECTOMY FUSION 1 LEVEL/HARDWARE REMOVAL (N/A)  Patient Location: PACU  Anesthesia Type: General  Level of Consciousness: awake and sedated  Airway & Oxygen Therapy: Patient Spontanous Breathing and Patient connected to face mask oxygen  Post-op Assessment: Report given to PACU RN, Post -op Vital signs reviewed and stable and Patient moving all extremities  Post vital signs: Reviewed and stable  Complications: No apparent anesthesia complications

## 2012-01-04 NOTE — Anesthesia Procedure Notes (Signed)
Procedure Name: Intubation Date/Time: 01/04/2012 12:41 PM Performed by: Marni Griffon Pre-anesthesia Checklist: Patient identified, Emergency Drugs available, Suction available and Patient being monitored Patient Re-evaluated:Patient Re-evaluated prior to inductionOxygen Delivery Method: Circle system utilized Preoxygenation: Pre-oxygenation with 100% oxygen Intubation Type: IV induction Ventilation: Mask ventilation without difficulty Laryngoscope Size: Mac and 4 Grade View: Grade I Tube type: Oral Number of attempts: 1 Airway Equipment and Method: Stylet Placement Confirmation: ETT inserted through vocal cords under direct vision,  breath sounds checked- equal and bilateral and positive ETCO2 Secured at: 22 (cm at teeth) cm Tube secured with: Tape Dental Injury: Teeth and Oropharynx as per pre-operative assessment

## 2012-01-04 NOTE — Progress Notes (Signed)
Orthopedic Tech Progress Note Patient Details:  [name] 21-Jul-1955 [id number]  Patient ID: [name], male   DOB: October 17, 1955, 56 y.o.   MRN: [id number] Brace order completed by Storm Frisk, Leah Skora 01/04/2012, 2:19 PM

## 2012-01-04 NOTE — Progress Notes (Signed)
Subjective:  The patient is alert and mildly confused/agitated.  Objective: Vital signs in last 24 hours: Temp:  [98.6 F (37 C)] 98.6 F (37 C) (08/21 1018) Pulse Rate:  [69] 69  (08/21 1018) Resp:  [18] 18  (08/21 1018) BP: (136)/(79) 136/79 mmHg (08/21 1018) SpO2:  [95 %] 95 % (08/21 1018)  Intake/Output from previous day:   Intake/Output this shift: Total I/O In: 2100 [I.V.:2100] Out: 150 [Blood:150]  Physical exam the patient is moving all 4 extremities well. His dressing is clean and dry without evidence of hematoma or shift.  Lab Results: No results found for this basename: WBC:2,HGB:2,HCT:2,PLT:2 in the last 72 hours BMET No results found for this basename: NA:2,K:2,CL:2,CO2:2,GLUCOSE:2,BUN:2,CREATININE:2,CALCIUM:2 in the last 72 hours  Studies/Results: No results found.  Assessment/Plan: The patient is doing well.  LOS: 0 days     Ikechukwu Cerny D 01/04/2012, 3:31 PM

## 2012-01-04 NOTE — Plan of Care (Signed)
Problem: Consults Goal: Diagnosis - Spinal Surgery Outcome: Completed/Met Date Met:  01/04/12 Cervical Spine Fusion

## 2012-01-04 NOTE — Anesthesia Preprocedure Evaluation (Addendum)
Anesthesia Evaluation  Patient identified by MRN, date of birth, ID band Patient awake    Reviewed: Allergy & Precautions, H&P , NPO status , Patient's Chart, lab work & pertinent test results, reviewed documented beta blocker date and time   Airway Mallampati: II TM Distance: >3 FB Neck ROM: full    Dental  (+) Missing, Teeth Intact and Dental Advisory Given   Pulmonary shortness of breath, asthma , sleep apnea and Continuous Positive Airway Pressure Ventilation , COPD COPD inhaler,  Pt denies using the CPAP he is supposed to use; says he just can't tolerate it... + rhonchi   + wheezing      Cardiovascular hypertension, Pt. on home beta blockers + dysrhythmias Atrial Fibrillation Rhythm:regular Rate:Normal     Neuro/Psych  Neuromuscular disease    GI/Hepatic GERD-  Controlled and Medicated,  Endo/Other  Well Controlled, Type 2, Oral Hypoglycemic Agents  Renal/GU      Musculoskeletal   Abdominal   Peds  Hematology   Anesthesia Other Findings   Reproductive/Obstetrics                        Anesthesia Physical Anesthesia Plan  ASA: III  Anesthesia Plan: General   Post-op Pain Management:    Induction: Intravenous  Airway Management Planned: Oral ETT  Additional Equipment:   Intra-op Plan:   Post-operative Plan: Extubation in OR  Informed Consent: I have reviewed the patients History and Physical, chart, labs and discussed the procedure including the risks, benefits and alternatives for the proposed anesthesia with the patient or authorized representative who has indicated his/her understanding and acceptance.     Plan Discussed with: CRNA, Anesthesiologist and Surgeon  Anesthesia Plan Comments:         Anesthesia Quick Evaluation

## 2012-01-04 NOTE — Op Note (Signed)
Brief history: The patient is a 56 year old white male who I performed a C4-5 and C5-6 anterior cervical discectomy fusion and plating on about 15 years ago. The patient has done well until recently when he developed neck and left arm pain consistent with a cervical radiculopathy. The patient has failed medical management and was worked up with a cervical MRI which demonstrated a herniated disc at C6-7 on the left. I discussed the various treatment options with the patient including surgery. The patient has weighed the risks, benefits, and alternatives surgery and decided proceed with a C6-7 anterior cervical discectomy fusion and plating as well as a exploration of the cervical arthrodesis and removal of his old plate.  Preoperative diagnosis: C6-7 herniated disc, cervical radiculopathy/myelopathy, cervical spinal stenosis, cervicalgia  Postoperative diagnosis: Same  Procedure: C6-7 Anterior cervical discectomy/decompression; he 69 interbody arthrodesis with local morcellized autograft bone and Actifuse bone graft extender; insertion of interbody prosthesis at C6-7 (Zimmer peek interbody prosthesis); anterior cervical plating from C6-7 with Codman titanium plate, exploration of cervical fusion, removal of old plate from W0-J8  Surgeon: Dr. Delma Officer  Asst.: Dr. Marikay Alar  Anesthesia: Gen. endotracheal  Estimated blood loss: 75 cc  Drains: None  Complications: None  Description of procedure: The patient was brought to the operating room by the anesthesia team. General endotracheal anesthesia was induced. A roll was placed under the patient's shoulders to keep the neck in the neutral position. The patient's anterior cervical region was then prepared with Betadine scrub and Betadine solution. Sterile drapes were applied.  The area to be incised was then injected with Marcaine with epinephrine solution. I then used a scalpel to make a transverse incision in the patient's right anterior neck.  I used the Metzenbaum scissors to divide the platysmal muscle and then to dissect medial to the sternocleidomastoid muscle, jugular vein, and carotid artery. We encountered scar tissue from his previous operation. I carefully dissected down towards the anterior cervical spine identifying the esophagus and retracting it medially. Then using Kitner swabs to clear soft tissue from the anterior cervical spine and exposed the old plate.. I then cleared the soft tissue from the screws and the cams we outlined the cams and then removed the screws and then removed the old plate. I inspected the arthrodesis at C4-5 and C5-6. It appeared solid.  I then used electrocautery to detach the medial border of the longus colli muscle bilaterally from the C6-7 intervertebral disc spaces. I then inserted the Caspar self-retaining retractor underneath the longus colli muscle bilaterally to provide exposure.  We then incised the intervertebral disc at C6-7. We then performed a partial intervertebral discectomy with a pituitary forceps and the Karlin curettes. I then inserted distraction screws into the vertebral bodies at C6 and C7. We then distracted the interspace. We then used the high-speed drill to decorticate the vertebral endplates at C6-7, to drill away the remainder of the intervertebral disc, to drill away some posterior spondylosis, and to thin out the posterior longitudinal ligament. I then incised ligament with the arachnoid knife. We then removed the ligament with a Kerrison punches undercutting the vertebral endplates and decompressing the thecal sac. We then performed foraminotomies about the bilateral C7 nerve roots. We encountered a large herniated disc at C6-7 the left compressing the C7 nerve root. We remove it with a pituitary forceps and the Kerrison punches. This completed the decompression at this level.  We now turned our to attention to the interbody fusion. We used the trial  spacers to determine the  appropriate size for the interbody prosthesis. We then pre-filled prosthesis with a combination of local morcellized autograft bone that we obtained during decompression as well as Actifuse bone graft extender. We then inserted the prosthesis into the distracted interspace at C6-7. We then removed the distraction screws. There was a good snug fit of the prosthesis in the interspace.  Having completed the fusion we now turned attention to the anterior spinal instrumentation. We used the high-speed drill to drill away some anterior spondylosis at the disc spaces so that the plate lay down flat. We selected the appropriate length titanium anterior cervical plate. We laid it along the anterior aspect of the vertebral bodies from C6-C7. We then drilled 14 mm holes at C6 and C7. We then secured the plate to the vertebral bodies by placing two 14 mm self-tapping screws at C6 and C7. We then obtained intraoperative radiograph. The demonstrating good position of the instrumentation. We therefore secured the screws the plate the locking each cam. This completed the instrumentation.  We then obtained hemostasis using bipolar electrocautery. We irrigated the wound out with bacitracin solution. We then removed the retractor. We inspected the esophagus for any damage. There was none apparent. We then reapproximated patient's platysmal muscle with interrupted 3-0 Vicryl suture. We then reapproximated the subcutaneous tissue with interrupted 3-0 Vicryl suture. The skin was reapproximated with Steri-Strips and benzoin. The wound was then covered with bacitracin ointment. A sterile dressing was applied. The drapes were removed. Patient was subsequently extubated by the anesthesia team and transported to the post anesthesia care unit in stable condition. All sponge instrument and needle counts were correct at the end of this case.

## 2012-01-04 NOTE — H&P (Signed)
Subjective: The patient is a 56 year old white male who I performed a C4-5 and C5-6 anterior cervical discectomy, fusion and plating on almost 15 years ago. The patient has done well for many years but recently has developed neck and left arm pain consistent with a cervical radiculopathy. He has failed medical management and was worked up with a cervical MRI. This demonstrated a herniated disc at C6-7. I discussed the various treatment options including surgery. The patient has weighed the risks, benefits, and alternatives surgery and decided proceed with a C6-7 intracervical sect me fusion and plating with exploration of his old fusion.   Past Medical History  Diagnosis Date  . HLD (hyperlipidemia)     takes Pravastatin daily  . Obesity   . Brain aneurysm 2009    questionable. A follow up CTA in 2009 showed no evidence of  . Tobacco abuse   . Overdose 2009    unintentional Flacanide overdose  . HTN (hypertension)     takes Prinizide daily  . Dyspnea     LHC 11/08: EF 60%, normal coronary arteries;  ETT-echo 2/11: normal at 71% PMHR  . Shortness of breath     lying/sitting/exertion  . Seasonal allergies     was on Claritin-stopped taking this pt states it wasn't helping  . Coughing   . History of bronchitis 24yrs ago  . COPD (chronic obstructive pulmonary disease)   . Emphysema   . PAF (paroxysmal atrial fibrillation)     echo 8/11: mild LVH, EF 60-65%, trivial MR, mod LAE, mild RAE, PASP 43-47, mild pulmo HTN;takes Flecanide and Propranalol daily  . Peripheral edema     takes Furosemide as needed  . Hypokalemia     hx of;pt states he was taking K+ but now eats a banana daily instead  . Dizziness   . Short-term memory loss   . Peripheral neuropathy   . Arthritis     lower back  . Chronic back pain     DDD/stenosis  . Bruises easily     takes Coumadin daily  . GERD (gastroesophageal reflux disease)     takes Omeprazole bid  . Colon polyps     9 polyps removed 10/13/11  .  Urinary frequency   . Urinary urgency   . Diabetes mellitus     takes Metformin and Glimepiride daily  . History of shingles   . OSA (obstructive sleep apnea)     doesn't have money to get CPAP machine  . Pneumonia   . Asthma     Past Surgical History  Procedure Date  . Neck surgery 71yrs ago  . Right shoulder surgery 4-28yrs ago    cyst removed  . Tonsilectomy, adenoidectomy, bilateral myringotomy and tubes     as a cild  . Left inguinal hernia repair     as a child  . Tee without cardioversion 05/05/2011    Procedure: TRANSESOPHAGEAL ECHOCARDIOGRAM (TEE);  Surgeon: Lewayne Bunting, MD;  Location: [facility] ENDOSCOPY;  Service: Cardiovascular;  Laterality: N/A;  . Cardioversion 05/05/2011    Procedure: CARDIOVERSION;  Surgeon: Lewayne Bunting, MD;  Location: [facility] ENDOSCOPY;  Service: Cardiovascular;  Laterality: N/A;  . Throat surgery 4-69yrs ago    "thought " it was cancer but came back not  . Carpal tunnel release 99/2000    bilateral  . Appendectomy 2-69yrs ago  . Knee surgery 6-62yrs ago    left  . Cardiac catheterization 2008    no significant CAD  . Tonsillectomy   .  Hernia repair     Allergies  Allergen Reactions  . Latex Itching    History  Substance Use Topics  . Smoking status: Current Everyday Smoker -- 0.3 packs/day for 40 years    Types: Cigarettes  . Smokeless tobacco: Never Used   Comment: Counseling sheet 09-2011   . Alcohol Use: 0.0 oz/week     couple of times a week    Family History  Problem Relation Age of Onset  . Colon cancer Neg Hx   . Anesthesia problems Neg Hx   . Hypotension Neg Hx   . Malignant hyperthermia Neg Hx   . Pseudochol deficiency Neg Hx    Prior to Admission medications   Medication Sig Start Date End Date Taking? Authorizing Provider  b complex vitamins tablet Take 1 tablet by mouth daily.     Yes Historical Provider, MD  flecainide (TAMBOCOR) 100 MG tablet Take 1 tablet (100 mg total) by mouth 2 (two) times daily. 09/13/11  Yes  Rollene Rotunda, MD  furosemide (LASIX) 40 MG tablet Take 40 mg by mouth daily as needed. For swelling   Yes Historical Provider, MD  glimepiride (AMARYL) 1 MG tablet Take 1 mg by mouth daily before breakfast.    Yes Historical Provider, MD  hydrochlorothiazide (HYDRODIURIL) 25 MG tablet Take 1 tablet (25 mg total) by mouth daily. 12/06/11 12/05/12 Yes Oretha Milch, MD  metFORMIN (GLUCOPHAGE) 500 MG tablet Take 500 mg by mouth daily with breakfast.    Yes Historical Provider, MD  mometasone-formoterol (DULERA) 100-5 MCG/ACT AERO Inhale 2 puffs into the lungs daily.   Yes Historical Provider, MD  Multiple Vitamin (MULTIVITAMIN) tablet Take 1 tablet by mouth daily.     Yes Historical Provider, MD  nebivolol (BYSTOLIC) 10 MG tablet Take 10 mg by mouth daily.   Yes Historical Provider, MD  omeprazole (PRILOSEC) 20 MG capsule Take 30- 60 min before your first and last meals of the day 12/23/11 12/22/12 Yes Nyoka Cowden, MD  oxyCODONE-acetaminophen (PERCOCET) 7.5-325 MG per tablet Take 1 tablet by mouth every 4 (four) hours as needed. For pain   Yes Historical Provider, MD  pravastatin (PRAVACHOL) 80 MG tablet Take 80 mg by mouth daily.   Yes Historical Provider, MD  propranolol (INDERAL LA) 80 MG 24 hr capsule Take 1 capsule (80 mg total) by mouth daily. 05/13/11 05/12/12 Yes Rollene Rotunda, MD  warfarin (COUMADIN) 5 MG tablet Take 5-7.5 mg by mouth daily. Take 7.5 MG every day of the week except Sundays; on Sundays take 5 MG   Yes Historical Provider, MD  albuterol (PROVENTIL HFA;VENTOLIN HFA) 108 (90 BASE) MCG/ACT inhaler Inhale 2 puffs into the lungs every 6 (six) hours as needed.    Historical Provider, MD  levocetirizine (XYZAL) 5 MG tablet Take 5 mg by mouth every evening.    Historical Provider, MD     Review of Systems  Positive ROS: As above  All other systems have been reviewed and were otherwise negative with the exception of those mentioned in the HPI and as above.  Objective: Vital signs  in last 24 hours: Temp:  [98.6 F (37 C)] 98.6 F (37 C) (08/21 1018) Pulse Rate:  [69] 69  (08/21 1018) Resp:  [18] 18  (08/21 1018) BP: (136)/(79) 136/79 mmHg (08/21 1018) SpO2:  [95 %] 95 % (08/21 1018)  General Appearance: Alert, cooperative, no distress, appears stated age Head: Normocephalic, without obvious abnormality, atraumatic Eyes: PERRL, conjunctiva/corneas clear, EOM's intact, fundi benign, both  eyes      Ears: Normal TM's and external ear canals, both ears Throat: Lips, mucosa, and tongue normal; teeth and gums normal Neck: Supple, symmetrical, trachea midline, no adenopathy; thyroid: No enlargement/tenderness/nodules; no carotid bruit or JVD. The cervical incision is well-healed. Spurling's testing is positive on the left. Back: Symmetric, no curvature, ROM normal, no CVA tenderness Lungs: Clear to auscultation bilaterally, respirations unlabored Heart: Regular rate and rhythm, S1 and S2 normal, no murmur, rub or gallop Abdomen: Soft, non-tender, bowel sounds active all four quadrants, no masses, no organomegaly Extremities: Extremities normal, atraumatic, no cyanosis or edema Pulses: 2+ and symmetric all extremities Skin: Skin color, texture, turgor normal, no rashes or lesions  NEUROLOGIC:   Mental status: alert and oriented, no aphasia, good attention span, Fund of knowledge/ memory ok Motor Exam - grossly normal except he has weakness in his left triceps at 4/5. Sensory Exam - grossly normal except he has decreased light-touch sedation and the left C7 distribution. Reflexes: Normal except he has a decrease left triceps reflex. Coordination - grossly normal Gait - grossly normal Balance - grossly normal Cranial Nerves: I: smell Not tested  II: visual acuity  OS: Normal    OD: Normal   II: visual fields Full to confrontation  II: pupils Equal, round, reactive to light  III,VII: ptosis None  III,IV,VI: extraocular muscles  Full ROM  V: mastication Normal  V:  facial light touch sensation  Normal  V,VII: corneal reflex  Present  VII: facial muscle function - upper  Normal  VII: facial muscle function - lower Normal  VIII: hearing Not tested  IX: soft palate elevation  Normal  IX,X: gag reflex Present  XI: trapezius strength  5/5  XI: sternocleidomastoid strength 5/5  XI: neck flexion strength  5/5  XII: tongue strength  Normal    Data Review Lab Results  Component Value Date   WBC 7.2 12/30/2011   HGB 14.7 12/30/2011   HCT 43.9 12/30/2011   MCV 95.2 12/30/2011   PLT 143* 12/30/2011   Lab Results  Component Value Date   NA 142 12/30/2011   K 3.8 12/30/2011   CL 100 12/30/2011   CO2 31 12/30/2011   BUN 11 12/30/2011   CREATININE 0.64 12/30/2011   GLUCOSE 179* 12/30/2011   Lab Results  Component Value Date   INR 1.10 01/04/2012    Assessment/Plan: C6-7 herniated disc, cervical radiculopathy, cervicalgia: I discussed the situation with the patient. I reviewed his MR scan with them and pointed out the abnormalities. We have discussed the various treatment options including surgery. I have described the surgical option of a C6-7 anterior cervical discectomy, fusion, and plating with exploration of his old cervical fusion. I've explained the surgery to him. I've shown him surgical models. We have discussed the risk, benefits, alternatives and likelihood of achieving our goals with surgery. I've answered all the patient's questions. He wants to proceed with surgery.   Idalia Allbritton D 01/04/2012 12:04 PM

## 2012-01-04 NOTE — Preoperative (Signed)
Beta Blockers   Reason not to administer Beta Blockers:Bystolic approx 8 a.m. today

## 2012-01-05 ENCOUNTER — Encounter (HOSPITAL_COMMUNITY): Payer: Self-pay | Admitting: Neurosurgery

## 2012-01-05 ENCOUNTER — Telehealth: Payer: Self-pay | Admitting: General Practice

## 2012-01-05 NOTE — Anesthesia Postprocedure Evaluation (Signed)
  Anesthesia Post-op Note  Patient: [name]  Procedure(s) Performed: Procedure(s) (LRB): ANTERIOR CERVICAL DECOMPRESSION/DISCECTOMY FUSION 1 LEVEL/HARDWARE REMOVAL (N/A)  Patient Location: PACU  Anesthesia Type: General  Level of Consciousness: awake, alert , oriented and patient cooperative  Airway and Oxygen Therapy: Patient Spontanous Breathing and Patient connected to nasal cannula oxygen  Post-op Pain: mild  Post-op Assessment: Post-op Vital signs reviewed, Patient's Cardiovascular Status Stable, Respiratory Function Stable, Patent Airway, No signs of Nausea or vomiting and Pain level controlled  Post-op Vital Signs: stable  Complications: No apparent anesthesia complications

## 2012-01-05 NOTE — Telephone Encounter (Signed)
Scheduled appointment for 8-30 @ 7:45 to check INR.  Patient had surgery on 8-21 and re-started coumadin that evening.

## 2012-01-05 NOTE — Discharge Summary (Signed)
Physician Discharge Summary  Patient ID: [name] MRN: [id number] DOB/AGE: 05/28/1955 56 y.o.  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 The patient tolerated the procedure well and was taken to the recovery room and then to the floor in stable condition. The hospital course was routine. There were no complications. The wound remained clean dry and intact. Pt had appropriate neck soreness. No complaints of arm pain or new N/T/W. The patient remained afebrile with stable vital signs, and tolerated a regular diet. The patient continued to increase activities, and pain was well controlled with oral pain medications.   Consults: none  Significant Diagnostic Studies:  Results for orders placed during the hospital encounter of 01/04/12  APTT      Component Value Range   aPTT 31  24 - 37 seconds  PROTIME-INR      Component Value Range   Prothrombin Time 14.4  11.6 - 15.2 seconds   INR 1.10  0.00 - 1.49  GLUCOSE, CAPILLARY      Component Value Range   Glucose-Capillary 133 (*) 70 - 99 mg/dL  GLUCOSE, CAPILLARY      Component Value Range   Glucose-Capillary 138 (*) 70 - 99 mg/dL   Comment 1 Documented in Chart     Comment 2 Notify RN    GLUCOSE, CAPILLARY      Component Value Range   Glucose-Capillary 189 (*) 70 - 99 mg/dL    Dg Cervical Spine 1 View  01/04/2012  *RADIOLOGY REPORT*  Clinical Data: ACDF at C6-7  DG CERVICAL SPINE - 1 VIEW  Comparison: Preoperative MRI 10/31/2011  Findings: Evidence of previous fusion from C4-C6 with interval removal of anterior hardware at these levels.  Anterior fusion hardware C6-7 partly visualized.  Endotracheal tube in place.  The upper cervical spine and lower cervical spine are obscured.  IMPRESSION:  Intraoperative imaging post C6-7 anterior fusion as above.   Original Report Authenticated By: Harrel Lemon, M.D.     Antibiotics:  Anti-infectives     Start     Dose/Rate Route Frequency Ordered Stop   01/04/12 1830   ceFAZolin (ANCEF) IVPB 2 g/50 mL premix  Status:  Discontinued        2 g 100 mL/hr over 30 Minutes Intravenous Every 8 hours 01/04/12 1723 01/04/12 2353   01/04/12 1316   bacitracin 50,000 Units in sodium chloride irrigation 0.9 % 500 mL irrigation  Status:  Discontinued          As needed 01/04/12 1317 01/04/12 1524   01/04/12 1234   ceFAZolin (ANCEF) 1-5 GM-% IVPB     Comments: Trellis Paganini: cabinet override         01/04/12 1234 01/04/12 1245   01/04/12 1204   bacitracin 40981 UNITS injection  Status:  Discontinued     Comments: KEY, JENNIFER: cabinet override         01/04/12 1204 01/04/12 2353   01/04/12 0000   ceFAZolin (ANCEF) IVPB 2 g/50 mL premix  Status:  Discontinued        2 g 100 mL/hr over 30 Minutes Intravenous 60 min pre-op 01/03/12 1024 01/04/12 1632   01/03/12 1025   ceFAZolin (ANCEF) IVPB 1 g/50 mL premix  Status:  Discontinued        1  g 100 mL/hr over 30 Minutes Intravenous 60 min pre-op 01/03/12 1025 01/03/12 1025          Discharge Exam: Blood pressure 139/79, pulse 66, temperature 97.8 F (36.6 C), temperature source Oral, resp. rate 20, SpO2 93.00%. PE: normal Incision CDI  Discharge Medications:   Medication List  As of 01/05/2012  7:00 AM   ASK your doctor about these medications         albuterol 108 (90 BASE) MCG/ACT inhaler   Commonly known as: PROVENTIL HFA;VENTOLIN HFA   Inhale 2 puffs into the lungs every 6 (six) hours as needed.      b complex vitamins tablet   Take 1 tablet by mouth daily.      DULERA 100-5 MCG/ACT Aero   Generic drug: mometasone-formoterol   Inhale 2 puffs into the lungs daily.      flecainide 100 MG tablet   Commonly known as: TAMBOCOR   Take 1 tablet (100 mg total) by mouth 2 (two)  times daily.      furosemide 40 MG tablet   Commonly known as: LASIX   Take 40 mg by mouth daily as needed. For swelling      glimepiride 1 MG tablet   Commonly known as: AMARYL   Take 1 mg by mouth daily before breakfast.      hydrochlorothiazide 25 MG tablet   Commonly known as: HYDRODIURIL   Take 1 tablet (25 mg total) by mouth daily.      levocetirizine 5 MG tablet   Commonly known as: XYZAL   Take 5 mg by mouth every evening.      metFORMIN 500 MG tablet   Commonly known as: GLUCOPHAGE   Take 500 mg by mouth daily with breakfast.      multivitamin tablet   Take 1 tablet by mouth daily.      nebivolol 10 MG tablet   Commonly known as: BYSTOLIC   Take 10 mg by mouth daily.      omeprazole 20 MG capsule   Commonly known as: PRILOSEC   Take 30- 60 min before your first and last meals of the day      oxyCODONE-acetaminophen 7.5-325 MG per tablet   Commonly known as: PERCOCET   Take 1 tablet by mouth every 4 (four) hours as needed. For pain      pravastatin 80 MG tablet   Commonly known as: PRAVACHOL   Take 80 mg by mouth daily.      propranolol ER 80 MG 24 hr capsule   Commonly known as: INDERAL LA   Take 1 capsule (80 mg total) by mouth daily.      warfarin 5 MG tablet   Commonly known as: COUMADIN   Take 5-7.5 mg by mouth daily. Take 7.5 MG every day of the week except Sundays; on Sundays take 5 MG            Disposition: home   Final Dx: ACDF  Discharge Orders    Future Appointments: Provider: Department: Dept Phone: Center:   01/12/2012 9:00 AM Julio Sicks, NP [facility] [phone] None   03/07/2012 9:00 AM Julio Sicks, NP [facility] [phone] None         Signed: Lathaniel Legate S 01/05/2012, 7:00 AM

## 2012-01-06 ENCOUNTER — Telehealth: Payer: Self-pay | Admitting: Pulmonary Disease

## 2012-01-06 MED ORDER — PREDNISONE 10 MG PO TABS: ORAL_TABLET | ORAL | Status: DC

## 2012-01-06 NOTE — Telephone Encounter (Signed)
Called and spoke with pt and he stated that his congestion has come back.  2 weeks ago he was given prednisone for this congestion and it cleared up.  He had neck surgery and had to take some pain meds and now the congestion is back and sometimes a brownish color.  He stated that this congestion is too thick to cough up.  Wanting recs from Dr. Vassie Loll.  Please advise.  Thanks  Allergies  Allergen Reactions  . Latex Itching

## 2012-01-06 NOTE — Telephone Encounter (Signed)
mucinex 600 bid Pred 20 mg x 4ds , 10 mg x 4ds then stop

## 2012-01-06 NOTE — Telephone Encounter (Signed)
Pt aware and rx sent. Madeleyn Schwimmer, CMA  

## 2012-01-10 ENCOUNTER — Encounter (HOSPITAL_COMMUNITY): Payer: Self-pay

## 2012-01-12 ENCOUNTER — Encounter: Payer: Self-pay | Admitting: Adult Health

## 2012-01-12 ENCOUNTER — Ambulatory Visit (INDEPENDENT_AMBULATORY_CARE_PROVIDER_SITE_OTHER): Payer: BC Managed Care – PPO | Admitting: Adult Health

## 2012-01-12 VITALS — BP 148/72 | HR 72 | Temp 97.6°F | Ht 75.0 in | Wt 257.6 lb

## 2012-01-12 DIAGNOSIS — G4733 Obstructive sleep apnea (adult) (pediatric): Secondary | ICD-10-CM

## 2012-01-12 DIAGNOSIS — J449 Chronic obstructive pulmonary disease, unspecified: Secondary | ICD-10-CM

## 2012-01-12 NOTE — Assessment & Plan Note (Signed)
Recent exacerbation, now resolved. Improved compensation of COPD with discontinuation of ACE inhibitor and nonselective beta blocker. Patient is encouraged on smoking cessation He will continue on [facility]  or twice daily .Patient's medications were reviewed today and patient education was given. Computerized medication calendar was adjusted/completed

## 2012-01-12 NOTE — Assessment & Plan Note (Signed)
cpap At bedtime

## 2012-01-12 NOTE — Patient Instructions (Addendum)
We are so excited you are better  Now is the time to quit smoking and see just how good your lungs can be!!! Will send Bisoprolol in place of Bystolic as this is generic.  Continue on Dulera 2 puffs Twice daily   Continue on CPAP At bedtime   follow up Dr. Vassie Loll  In 2 months and As needed

## 2012-01-12 NOTE — Progress Notes (Signed)
Subjective:    Patient ID: [name], male    DOB: [birth year], 56 y.o.   MRN: [id number]  HPI PCP - Aida Puffer  56/M, smoker with atrial fibrillation for FU of COPD & sleep apnea.  He had atrial fibrillation treated with flecainide in the past. He had TEE/DCCV. He is maintaining sinus rhythm,is on anticoagulation. 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  He reports episodes of coughing which have made him 'pass out'. He reports sinus drainage for many years but has never seen ENT.  He does have severe sleep apnea but couldn't afford CPAP. Reviewed PSG from [facility] - severe with AHI 31.h corrected by CPAP 5 cm (wt was 250 lbs)  He smokes 1 PPD, drinks alcohol -'on weekends' - but I suspect more.   11/22/11 pre-op clearance for neck sx >> given course of prednisone for asthmatic bronchitis  dulera 100/5  Can use albuterol as needed.  Was set up with cpap  4-5 cigs/d, smoked 1/2 pack today due to stress CXR 11/21/11 no infx/ effusion He is not compliant with dulera, He has no income now & is unable to afford expensive meds Med review shows lisinopril & propranolol -which may be affecting his breathing rec STOP taking zestoretic Take HCTZ 25 mg daily instead I will ask your heart doctor to change propranolol to another medication You have to STOP smoking Stay on dulera 2 puffs twice daily until surgery  Take albuterol 2 puffs for emergency Stay on CPAP every night & right after surgery   12/23/2011 ov/acute work in [place], still smoking, lost instructions and may still be on ace, very hoarse, inhalers not helping.  No unusual cough, purulent sputum or sinus/hb symptoms on present rx. Chest feels generally tighter x 2 weeks and difficulty lying down and night due to sob. >>Prednisone taper for AB , PPI , changed from propanolol to bystolic   01/12/2012 Follow up and Med  review  Patient returns for a two-week followup visit with a COPD exacerbation treated with a prednisone taper. Patient was also changed from propanolol to Bystolic. Patient says that he feels so much better with decreased shortness of breath and increased activity tolerance. Patient has finished an extended course of prednisone He is also underwent neck surgery and says that he has had a very good recovery with resolution of neck pain. He is currently a neck brace. We reviewed all his medications and organized them into a medication calendar with patient education. It appears that he is taking his medications correctly. He does require generic prescriptions because that is the only prescription that is covered on his insurance He denies any hemoptysis, chest pain, orthopnea, PND, or leg swelling Continues to smoke, we discussed extensive smoking cessation.   ROS:  Constitutional:   No  weight loss, night sweats,  Fevers, chills, fatigue, or  lassitude.  HEENT:   No headaches,  Difficulty swallowing,  Tooth/dental problems, or  Sore throat,                No sneezing, itching, ear ache, nasal congestion, post nasal drip,   CV:  No chest pain,  Orthopnea, PND, swelling in lower extremities, anasarca, dizziness, palpitations, syncope.   GI  No heartburn, indigestion, abdominal pain, nausea, vomiting, diarrhea, change in bowel habits, loss of appetite, bloody stools.   Resp:    No  excess mucus, no productive cough,  No non-productive cough,  No coughing up of blood.  No change in color of mucus.  No wheezing.  No chest wall deformity  Skin: no rash or lesions.  GU: no dysuria, change in color of urine, no urgency or frequency.  No flank pain, no hematuria   MS:  No joint   swelling.     No back pain.  Psych:  No change in mood or affect. No depression or anxiety.  No memory loss.                 Objective:   Physical Exam  Gen. Pleasant, well-nourished, in no distress, NAD    ENT - no lesions, no post nasal drip, class 2 airway Neck: No JVD, no thyromegaly, no carotid bruits Lungs: no use of accessory muscles, no dullness to percussion, CTA - no psuedowheezing  Cardiovascular: Rhythm regular, heart sounds  normal, no murmurs or gallops, no peripheral edema Abdomen: soft and non-tender, no hepatosplenomegaly, BS normal. Musculoskeletal: No deformities, no cyanosis or clubbing Neuro:  alert, non focal        Assessment & Plan:

## 2012-01-12 NOTE — Addendum Note (Signed)
Addended by: Boone Master E on: 01/12/2012 10:13 AM   Modules accepted: Orders

## 2012-01-13 ENCOUNTER — Ambulatory Visit (INDEPENDENT_AMBULATORY_CARE_PROVIDER_SITE_OTHER): Payer: BC Managed Care – PPO | Admitting: *Deleted

## 2012-01-13 DIAGNOSIS — Z7901 Long term (current) use of anticoagulants: Secondary | ICD-10-CM

## 2012-01-13 DIAGNOSIS — I4891 Unspecified atrial fibrillation: Secondary | ICD-10-CM

## 2012-01-17 ENCOUNTER — Telehealth: Payer: Self-pay | Admitting: Adult Health

## 2012-01-17 MED ORDER — BISOPROLOL FUMARATE 10 MG PO TABS: 10.0000 mg | ORAL_TABLET | Freq: Every day | ORAL | Status: DC

## 2012-01-17 NOTE — Telephone Encounter (Signed)
Sorry refills were suppose to be sent  Bisoprolol 10mg  is on his med list  Apologize for over sight  and send rx #30 with 5 refills   Thanks  Please contact office for sooner follow up if symptoms do not improve or worsen or seek emergency care

## 2012-01-17 NOTE — Telephone Encounter (Signed)
I spoke with pt and he stated his BP medication was never called in last week. Per TP instructions: Will send Bisoprolol in place of Bystolic as this is generic. TP please advise if we need to send 5 mg or 10 mg tablets. thanks

## 2012-01-17 NOTE — Telephone Encounter (Signed)
rx has been sent and pt is aware 

## 2012-01-18 ENCOUNTER — Telehealth: Payer: Self-pay | Admitting: Pulmonary Disease

## 2012-01-18 NOTE — Telephone Encounter (Signed)
I spoke with the pt and he was concerned about some of the side effects of bystolic. The pt has not taken the medication yet. I advised the pt to take the mediation and monitor for any side effects. I f he experiences any to call. Pt states understanding. Carron Curie, CMA

## 2012-02-08 ENCOUNTER — Other Ambulatory Visit: Payer: Self-pay | Admitting: *Deleted

## 2012-02-08 MED ORDER — WARFARIN SODIUM 5 MG PO TABS: 5.0000 mg | ORAL_TABLET | ORAL | Status: DC

## 2012-02-24 ENCOUNTER — Ambulatory Visit (INDEPENDENT_AMBULATORY_CARE_PROVIDER_SITE_OTHER): Payer: BC Managed Care – PPO | Admitting: *Deleted

## 2012-02-24 DIAGNOSIS — Z7901 Long term (current) use of anticoagulants: Secondary | ICD-10-CM

## 2012-02-24 DIAGNOSIS — I4891 Unspecified atrial fibrillation: Secondary | ICD-10-CM

## 2012-02-24 LAB — POCT INR: INR: 2.2

## 2012-03-05 ENCOUNTER — Ambulatory Visit (INDEPENDENT_AMBULATORY_CARE_PROVIDER_SITE_OTHER): Payer: BC Managed Care – PPO | Admitting: Pulmonary Disease

## 2012-03-05 ENCOUNTER — Encounter: Payer: Self-pay | Admitting: Pulmonary Disease

## 2012-03-05 VITALS — BP 132/76 | HR 65 | Temp 97.6°F | Ht 75.0 in | Wt 270.6 lb

## 2012-03-05 DIAGNOSIS — Z23 Encounter for immunization: Secondary | ICD-10-CM

## 2012-03-05 DIAGNOSIS — G4733 Obstructive sleep apnea (adult) (pediatric): Secondary | ICD-10-CM

## 2012-03-05 DIAGNOSIS — J449 Chronic obstructive pulmonary disease, unspecified: Secondary | ICD-10-CM

## 2012-03-05 NOTE — Progress Notes (Signed)
  Subjective:    Patient ID: [name], male    DOB: 03-Nov-1955, 56 y.o.   MRN: [id number]  HPI PCP - Aida Puffer   56/M, smoker with atrial fibrillation for FU of COPD & sleep apnea.  He had atrial fibrillation treated with flecainide in the past. He had TEE/DCCV. He is maintaining sinus rhythm,is on anticoagulation.  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  He reports episodes of coughing which have made him 'pass out'. He reports sinus drainage for many years but has never seen ENT.  He  has severe sleep apnea but couldn't afford CPAP. Reviewed PSG from [facility] - severe with AHI 31.h corrected by CPAP 5 cm (wt was 250 lbs)    03/05/2012  7/13 Underwent neck sx >> given pre-op prednisone for asthmatic bronchitis -uneventful post op Continues to smoke 4-5 cigs/d  lisinopril & propranolol were stopped -on bisoprolol instead - BP ok 8/13 acute OV - pred cleared up congestion has only been able to use cpap machine total 4 hrs since having the machine. does not wear it at all now. "I am up so much at night due to pain in legs' breathing is okay, very little cough. no wheezing, chest tx.  CAT 17 Spirometry fev1 68%   Review of Systems neg for any significant sore throat, dysphagia, itching, sneezing, nasal congestion or excess/ purulent secretions, fever, chills, sweats, unintended wt loss, pleuritic or exertional cp, hempoptysis, orthopnea pnd or change in chronic leg swelling. Also denies presyncope, palpitations, heartburn, abdominal pain, nausea, vomiting, diarrhea or change in bowel or urinary habits, dysuria,hematuria, rash, arthralgias, visual complaints, headache, numbness weakness or ataxia.     Objective:   Physical Exam   Gen. Pleasant, obese, in no distress ENT - no lesions, no post nasal drip Neck: No JVD, no thyromegaly, no carotid bruits Lungs: no use  of accessory muscles, no dullness to percussion, decreased without rales or rhonchi  Cardiovascular: Rhythm regular, heart sounds  normal, no murmurs or gallops, no peripheral edema Musculoskeletal: No deformities, no cyanosis or clubbing , no tremors        Assessment & Plan:

## 2012-03-05 NOTE — Assessment & Plan Note (Signed)
You have to quit smoking -lung function is dropping, medications are not as efective if you smoke FLu shot

## 2012-03-05 NOTE — Assessment & Plan Note (Signed)
Get back on CPAP - we discussed benefits for your heart & atrial fib  Weight loss encouraged, compliance with goal of at least 4-6 hrs every night is the expectation. Advised against medications with sedative side effects Cautioned against driving when sleepy - understanding that sleepiness will vary on a day to day basis

## 2012-03-05 NOTE — Patient Instructions (Addendum)
You have to quit smoking -lung function is dropping, medications are not as efective if you smoke FLu shot Get back on CPAP - we discussed benefits for your heart & atrial fib

## 2012-03-07 ENCOUNTER — Ambulatory Visit: Payer: BC Managed Care – PPO | Admitting: Adult Health

## 2012-03-09 ENCOUNTER — Other Ambulatory Visit: Payer: Self-pay | Admitting: Pulmonary Disease

## 2012-03-16 ENCOUNTER — Telehealth: Payer: Self-pay | Admitting: Pulmonary Disease

## 2012-03-16 MED ORDER — MOMETASONE FURO-FORMOTEROL FUM 100-5 MCG/ACT IN AERO: 2.0000 | INHALATION_SPRAY | Freq: Two times a day (BID) | RESPIRATORY_TRACT | Status: DC

## 2012-03-16 NOTE — Telephone Encounter (Signed)
Samples left at front. Pt is aware. Jennifer Castillo, CMA  

## 2012-04-13 ENCOUNTER — Telehealth: Payer: Self-pay | Admitting: Cardiology

## 2012-04-13 NOTE — Telephone Encounter (Signed)
New message:  Pt had in injection in his back for a procedure and heart is going into irregular rate for short periods of time.  Please call him back regarding this. [phone]

## 2012-04-13 NOTE — Telephone Encounter (Signed)
Left message for pt to call.

## 2012-04-24 ENCOUNTER — Ambulatory Visit (INDEPENDENT_AMBULATORY_CARE_PROVIDER_SITE_OTHER): Payer: BC Managed Care – PPO | Admitting: *Deleted

## 2012-04-24 DIAGNOSIS — I4891 Unspecified atrial fibrillation: Secondary | ICD-10-CM

## 2012-04-24 DIAGNOSIS — Z7901 Long term (current) use of anticoagulants: Secondary | ICD-10-CM

## 2012-04-24 LAB — POCT INR: INR: 1.8

## 2012-04-24 MED ORDER — WARFARIN SODIUM 5 MG PO TABS: ORAL_TABLET | ORAL | Status: DC

## 2012-05-10 ENCOUNTER — Ambulatory Visit (INDEPENDENT_AMBULATORY_CARE_PROVIDER_SITE_OTHER): Payer: BC Managed Care – PPO

## 2012-05-10 ENCOUNTER — Encounter: Payer: BC Managed Care – PPO | Attending: Family Medicine | Admitting: *Deleted

## 2012-05-10 ENCOUNTER — Encounter: Payer: Self-pay | Admitting: *Deleted

## 2012-05-10 VITALS — Ht 75.0 in | Wt 259.7 lb

## 2012-05-10 DIAGNOSIS — E669 Obesity, unspecified: Secondary | ICD-10-CM | POA: Insufficient documentation

## 2012-05-10 DIAGNOSIS — Z7901 Long term (current) use of anticoagulants: Secondary | ICD-10-CM

## 2012-05-10 DIAGNOSIS — E119 Type 2 diabetes mellitus without complications: Secondary | ICD-10-CM | POA: Insufficient documentation

## 2012-05-10 DIAGNOSIS — I4891 Unspecified atrial fibrillation: Secondary | ICD-10-CM

## 2012-05-10 DIAGNOSIS — Z713 Dietary counseling and surveillance: Secondary | ICD-10-CM | POA: Insufficient documentation

## 2012-05-10 NOTE — Patient Instructions (Signed)
Plan: Aim for 3 Carb Choices (45 grams) per meal +/- 1 either way Continue with walking and yard work as tolerated Continue checking your BG every day and consider checking after meals occasionally Continue taking Glimiperide (Amaryl) and Metformin as directed by MD

## 2012-05-10 NOTE — Progress Notes (Signed)
  Medical Nutrition Therapy:  Appt start time: 0800 end time:  0900.  Assessment:  Primary concerns today: patient here for assistance with weight loss and diabetes. Lives with wife, both shop and prepare food. He is disabled, enjoys yard work and visiting with friends. SMBG daily, states usual average is 160-180, but went up to 300's with back injection a few weeks ago. Goal is to get back down to 120's. States he has major fluctuations with weight, perhaps due to edema. Has trouble sleeping with back and foot pains.  MEDICATIONS: see list   DIETARY INTAKE:  Usual eating pattern includes 3 meals and 1-2 snacks per day.  Everyday foods include fair variety of all food groups.  Avoided foods include high sugar foods and beverages, green leafy vegetables due to Coumadin med.    24-hr recall:  B ( AM): banana OR flavored oatmeal x 2, water or Gatorade  Snk ( AM): occasionally fruit or a cookie  L ( PM): either a sandwich OR eat at restaurant with a friend Snk ( PM): popcorn or a hotdog if gets hungry D ( PM): wife works evenings so not usually a hot meal, has a snack like PNB and crackers OR apple cake with milk Snk ( PM): cookie infrequently, if wakes up during night, may have some leftovers Beverages: water, Gatorade, diet soda  Usual physical activity: walks and yard work as able with back problems  Estimated energy needs: 1600 calories 180 g carbohydrates 120 g protein 44 g fat  Progress Towards Goal(s):  In progress.   Nutritional Diagnosis:  NB-1.1 Food and nutrition-related knowledge deficit As related to diabetes management.  As evidenced by A1c of 8.0%.    Intervention:  Nutrition counseling and diabetes education initiated. Discussed basic physiology of diabetes, SMBG and rationale of checking BG at alternate times of day, A1c, Carb Counting and reading food labels, and benefits of increased activity. Plan to discuss diabetes medications, fat grams and SMBG in more detail at  next visit.  Plan: Aim for 3 Carb Choices (45 grams) per meal +/- 1 either way Continue with walking and yard work as tolerated Continue checking your BG every day and consider checking after meals occasionally Continue taking Glimiperide (Amaryl) and Metformin as directed by MD  Handouts given during visit include: Living Well with Diabetes Carb Counting and Food Label handouts Meal Plan Card  Monitoring/Evaluation:  Dietary intake, exercise, reading food labels, and body weight in 4 week(s).

## 2012-05-23 ENCOUNTER — Telehealth: Payer: Self-pay | Admitting: Pulmonary Disease

## 2012-05-23 NOTE — Telephone Encounter (Signed)
Pt aware we do not have any samples. Nothing further was needed

## 2012-06-07 ENCOUNTER — Ambulatory Visit: Payer: BC Managed Care – PPO | Admitting: *Deleted

## 2012-06-07 ENCOUNTER — Ambulatory Visit (INDEPENDENT_AMBULATORY_CARE_PROVIDER_SITE_OTHER): Payer: BC Managed Care – PPO

## 2012-06-07 DIAGNOSIS — I4891 Unspecified atrial fibrillation: Secondary | ICD-10-CM

## 2012-06-07 DIAGNOSIS — Z7901 Long term (current) use of anticoagulants: Secondary | ICD-10-CM

## 2012-06-28 ENCOUNTER — Ambulatory Visit: Payer: BC Managed Care – PPO | Admitting: *Deleted

## 2012-07-09 ENCOUNTER — Encounter: Payer: Self-pay | Admitting: Pulmonary Disease

## 2012-07-09 ENCOUNTER — Ambulatory Visit (INDEPENDENT_AMBULATORY_CARE_PROVIDER_SITE_OTHER): Payer: BC Managed Care – PPO | Admitting: Pulmonary Disease

## 2012-07-09 VITALS — BP 130/64 | HR 72 | Temp 97.9°F | Ht 75.0 in | Wt 261.6 lb

## 2012-07-09 DIAGNOSIS — J449 Chronic obstructive pulmonary disease, unspecified: Secondary | ICD-10-CM

## 2012-07-09 NOTE — Assessment & Plan Note (Signed)
Ok to use albuterol nebs twice daily Pulmonary rehab referral OK yo stop [place]

## 2012-07-09 NOTE — Progress Notes (Signed)
  Subjective:    Patient ID: [name], male    DOB: 1956-01-31, 57 y.o.   MRN: [id number]  HPI PCP - Aida Puffer  56/M, smoker with atrial fibrillation for FU of COPD & sleep apnea.  He had atrial fibrillation treated with flecainide in the past. He had TEE/DCCV. He is maintaining sinus rhythm,is on anticoagulation.  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  He reports episodes of coughing which have made him 'pass out'. He reports sinus drainage for many years but has never seen ENT.  He has severe sleep apnea but couldn't afford CPAP. Reviewed PSG from [facility] - severe with AHI 31.h corrected by CPAP 5 cm (wt was 250 lbs)  7/13 Underwent neck sx >> given pre-op prednisone for asthmatic bronchitis -uneventful post op  lisinopril & propranolol were stopped -on bisoprolol instead - BP ok  8/13 CAT 17  Spirometry fev1 68%   07/09/2012 he has good and bad days with his breathing. has very little cough, chest tx occasionally but no wheezing. He has not had no inhalers only uses the nebs bid. Ran out of dulera pt states he uses his CPAP every once in a while, has large leak when he moves Smokes 3-4 cigs/d     Review of Systems neg for any significant sore throat, dysphagia, itching, sneezing, nasal congestion or excess/ purulent secretions, fever, chills, sweats, unintended wt loss, pleuritic or exertional cp, hempoptysis, orthopnea pnd or change in chronic leg swelling. Also denies presyncope, palpitations, heartburn, abdominal pain, nausea, vomiting, diarrhea or change in bowel or urinary habits, dysuria,hematuria, rash, arthralgias, visual complaints, headache, numbness weakness or ataxia.     Objective:   Physical Exam  Gen. Pleasant, obese, in no distress ENT - no lesions, no post nasal drip Neck: No JVD, no thyromegaly, no carotid bruits Lungs: no use of accessory  muscles, no dullness to percussion, decreased without rales or rhonchi  Cardiovascular: Rhythm regular, heart sounds  normal, no murmurs or gallops, no peripheral edema Musculoskeletal: No deformities, no cyanosis or clubbing , no tremors         Assessment & Plan:

## 2012-07-09 NOTE — Assessment & Plan Note (Signed)
Go over to the sleep lab [phone] for mask fit Weight loss encouraged, compliance with goal of at least 4-6 hrs every night is the expectation. Advised against medications with sedative side effects Cautioned against driving when sleepy - understanding that sleepiness will vary on a day to day basis

## 2012-07-09 NOTE — Patient Instructions (Addendum)
Ok to use albuterol nebs twice daily Pulmonary rehab referral Go over to the sleep lab [phone] for mask fit Weight loss encouraged, compliance with goal of at least 4-6 hrs every night is the expectation.

## 2012-07-16 ENCOUNTER — Other Ambulatory Visit: Payer: Self-pay | Admitting: Adult Health

## 2012-07-16 NOTE — Telephone Encounter (Signed)
Changed from lisinopril to bisoprolol w/ refills.  Refill x1 sent to pharmacy with request to defer future to pt's PCP Dr Aida Puffer.

## 2012-07-17 ENCOUNTER — Ambulatory Visit: Payer: BC Managed Care – PPO | Admitting: *Deleted

## 2012-07-18 ENCOUNTER — Other Ambulatory Visit: Payer: Self-pay

## 2012-07-18 MED ORDER — PRAVASTATIN SODIUM 80 MG PO TABS: 80.0000 mg | ORAL_TABLET | Freq: Every day | ORAL | Status: DC

## 2012-07-18 NOTE — Telephone Encounter (Signed)
..   Requested Prescriptions   Signed Prescriptions Disp Refills  . pravastatin (PRAVACHOL) 80 MG tablet 30 tablet 6    Sig: Take 1 tablet (80 mg total) by mouth at bedtime.    Authorizing Provider: Rollene Rotunda    Ordering User: Christella Hartigan, ROSE Judie Petit

## 2012-07-23 ENCOUNTER — Encounter: Payer: Self-pay | Admitting: *Deleted

## 2012-07-23 ENCOUNTER — Ambulatory Visit (INDEPENDENT_AMBULATORY_CARE_PROVIDER_SITE_OTHER): Payer: BC Managed Care – PPO | Admitting: Cardiology

## 2012-07-23 ENCOUNTER — Encounter: Payer: Self-pay | Admitting: Cardiology

## 2012-07-23 ENCOUNTER — Telehealth: Payer: Self-pay | Admitting: Cardiology

## 2012-07-23 ENCOUNTER — Ambulatory Visit (INDEPENDENT_AMBULATORY_CARE_PROVIDER_SITE_OTHER): Payer: BC Managed Care – PPO

## 2012-07-23 VITALS — BP 122/80 | HR 96 | Ht 75.0 in | Wt 257.0 lb

## 2012-07-23 DIAGNOSIS — I4891 Unspecified atrial fibrillation: Secondary | ICD-10-CM

## 2012-07-23 DIAGNOSIS — I4892 Unspecified atrial flutter: Secondary | ICD-10-CM

## 2012-07-23 DIAGNOSIS — Z7901 Long term (current) use of anticoagulants: Secondary | ICD-10-CM

## 2012-07-23 LAB — CBC
HCT: 45.2 % (ref 39.0–52.0)
Hemoglobin: 15.3 g/dL (ref 13.0–17.0)
Platelets: 154 10*3/uL (ref 150.0–400.0)
RDW: 15.3 % — ABNORMAL HIGH (ref 11.5–14.6)
WBC: 7.6 10*3/uL (ref 4.5–10.5)

## 2012-07-23 LAB — BASIC METABOLIC PANEL
BUN: 11 mg/dL (ref 6–23)
Chloride: 104 mEq/L (ref 96–112)
GFR: 146.03 mL/min (ref >60.00)
Potassium: 3.9 mEq/L (ref 3.5–5.1)
Sodium: 140 mEq/L (ref 135–145)

## 2012-07-23 NOTE — Telephone Encounter (Signed)
New Problem:    Patient called in because his heart has gone back into Atrial Flutter and he has off and on SOB with chest pain.  Patient's pulse is between 120-130 and would like to know how to proceed.  Please call back.

## 2012-07-23 NOTE — Patient Instructions (Addendum)
The current medical regimen is effective;  continue present plan and medications.  Please have blood work today  (BMP and CBC)  Your physician has requested that you have a Cardioversion.   Electrical Cardioversion uses a jolt of electricity to your heart either through paddles or wired patches attached to your chest. This is a controlled, usually prescheduled, procedure. This procedure is done at the hospital and you are not awake during the procedure. You usually go home the day of the procedure. Please see the instruction sheet given to you today for more information.  You have been scheduled for Thursday July 26, 2012 with Dr Antoine Poche.

## 2012-07-23 NOTE — Progress Notes (Signed)
HPI The patient presents for followup of atrial flutter. He has had atrial fibrillation treated with flecainide in the past. He had TEE/DCCV. He remains on anticoagulation. He says he occasionally has paroxysms of irregular rhythm but they may last for only a few hours at the most. He says he went back into what he thought was his flutter on Friday and it has been persistent since then. He feels the palpitations though he's not having any presyncope or syncope. He reports he is rarely drinking alcohol. He does have some chronic fatigue and says he sleeps poorly. He wears CPAP and just got a new mask which she hasn't started using yet.  He denies chest pressure, neck or arm discomfort.  He continues to be bothered by neuropathic leg pain.   Allergies  Allergen Reactions  . Latex Itching    Current Outpatient Prescriptions  Medication Sig Dispense Refill  . albuterol (ACCUNEB) 1.25 MG/3ML nebulizer solution Take 1 ampule by nebulization 2 (two) times daily.      [name] albuterol (PROVENTIL HFA;VENTOLIN HFA) 108 (90 BASE) MCG/ACT inhaler Inhale 2 puffs into the lungs every 4 (four) hours as needed.       . bisoprolol (ZEBETA) 10 MG tablet TAKE 1 TABLET BY MOUTH ONCE DAILY  30 tablet  1  . diazepam (VALIUM) 5 MG tablet Take 5 mg by mouth every 6 (six) hours as needed.      . flecainide (TAMBOCOR) 100 MG tablet Take 1 tablet (100 mg total) by mouth 2 (two) times daily.  60 tablet  10  . furosemide (LASIX) 40 MG tablet Take 40 mg by mouth 2 (two) times daily as needed. For swelling      . glimepiride (AMARYL) 1 MG tablet Take 1 mg by mouth daily before breakfast.       . hydrochlorothiazide (HYDRODIURIL) 25 MG tablet TAKE 1 TABLET BY MOUTH DAILY  30 tablet  5  . HYDROcodone-acetaminophen (NORCO) 10-325 MG per tablet Take 1 tablet by mouth every 4 (four) hours as needed.      . metFORMIN (GLUCOPHAGE) 500 MG tablet Take 500 mg by mouth 2 (two) times daily with a meal.       . omeprazole (PRILOSEC) 20 MG  capsule Take 30- 60 min before your first and last meals of the day      . potassium chloride (KLOR-CON 10) 10 MEQ tablet Take 1 tablet daily with lasix as needed      . pravastatin (PRAVACHOL) 80 MG tablet Take 1 tablet (80 mg total) by mouth at bedtime.  30 tablet  6  . warfarin (COUMADIN) 5 MG tablet Take as directed by coumadin clinic  45 tablet  2   No current facility-administered medications for this visit.    Past Medical History  Diagnosis Date  . HLD (hyperlipidemia)     takes Pravastatin daily  . Obesity   . Brain aneurysm 2009    questionable. A follow up CTA in 2009 showed no evidence of  . Tobacco abuse   . Overdose 2009    unintentional Flacanide overdose  . HTN (hypertension)     takes Prinizide daily  . Dyspnea     LHC 11/08: EF 60%, normal coronary arteries;  ETT-echo 2/11: normal at 71% PMHR  . Shortness of breath     lying/sitting/exertion  . Seasonal allergies     was on Claritin-stopped taking this pt states it wasn't helping  . Coughing   . History of bronchitis  44yrs ago  . COPD (chronic obstructive pulmonary disease)   . Emphysema   . PAF (paroxysmal atrial fibrillation)     echo 8/11: mild LVH, EF 60-65%, trivial MR, mod LAE, mild RAE, PASP 43-47, mild pulmo HTN;takes Flecanide and Propranalol daily  . Peripheral edema     takes Furosemide as needed  . Hypokalemia     hx of;pt states he was taking K+ but now eats a banana daily instead  . Dizziness   . Short-term memory loss   . Peripheral neuropathy   . Arthritis     lower back  . Chronic back pain     DDD/stenosis  . Bruises easily     takes Coumadin daily  . GERD (gastroesophageal reflux disease)     takes Omeprazole bid  . Colon polyps     9 polyps removed 10/13/11  . Urinary frequency   . Urinary urgency   . Diabetes mellitus     takes Metformin and Glimepiride daily  . History of shingles   . OSA (obstructive sleep apnea)     doesn't have money to get CPAP machine  . Pneumonia     . Asthma     Past Surgical History  Procedure Laterality Date  . Neck surgery  46yrs ago  . Right shoulder surgery  4-81yrs ago    cyst removed  . Tonsilectomy, adenoidectomy, bilateral myringotomy and tubes      as a cild  . Left inguinal hernia repair      as a child  . Tee without cardioversion  05/05/2011    Procedure: TRANSESOPHAGEAL ECHOCARDIOGRAM (TEE);  Surgeon: Lewayne Bunting, MD;  Location: [facility] ENDOSCOPY;  Service: Cardiovascular;  Laterality: N/A;  . Cardioversion  05/05/2011    Procedure: CARDIOVERSION;  Surgeon: Lewayne Bunting, MD;  Location: [facility] ENDOSCOPY;  Service: Cardiovascular;  Laterality: N/A;  . Throat surgery  4-38yrs ago    "thought " it was cancer but came back not  . Carpal tunnel release  99/2000    bilateral  . Appendectomy  2-65yrs ago  . Knee surgery  6-71yrs ago    left  . Cardiac catheterization  2008    no significant CAD  . Tonsillectomy    . Hernia repair    . Anterior cervical decomp/discectomy fusion  01/04/2012    Procedure: ANTERIOR CERVICAL DECOMPRESSION/DISCECTOMY FUSION 1 LEVEL/HARDWARE REMOVAL;  Surgeon: Cristi Loron, MD;  Location: [facility] NEURO ORS;  Service: Neurosurgery;  Laterality: N/A;  explore cervical fusion Cervical six - seven  with removal of codman plate anterior cervical decompression with fusion interbody prothesis plating and bonegraft    ROS:  Burning in feet.  Otherwise as stated in the HPI and negative for all other systems.  PHYSICAL EXAM Ht 6\' 3"  (1.905 m)  Wt 257 lb (116.574 kg)  BMI 32.12 kg/m2 GENERAL:  Well appearing HEENT:  Pupils equal round and reactive, fundi not visualized, oral mucosa , edentulous, ruddy appearance NECK:  No jugular venous distention, waveform within normal limits, carotid upstroke brisk and symmetric, no bruits, no thyromegaly LYMPHATICS:  No cervical, inguinal adenopathy LUNGS:  Clear to auscultation bilaterally, diminished BS. BACK:  No CVA tenderness CHEST:  Unremarkable HEART:   PMI not displaced or sustained,S1 and S2 within normal limits, no S3, no S4, no clicks, no rubs, no murmurs, regular ABD:  Flat, positive bowel sounds normal in frequency in pitch, no bruits, no rebound, no guarding, no midline pulsatile mass, no hepatomegaly, no splenomegaly EXT:  2 plus pulses throughout, no edema, no cyanosis no clubbing SKIN:  No rashes no nodules NEURO:  Cranial nerves II through XII grossly intact, motor grossly intact throughout PSYCH:  Cognitively intact, oriented to person place and time  EKG:  Atrial flutter variable ventricular rate.  No acute ST T wave changes. Axis WNL.  07/23/2012  ASSESSMENT AND PLAN  ATRIAL FLUTTER:  The patient is back in atrial flutter and this seems to be more sustained. I am going to confirm that he has had therapeutic INR. The last 2 readings have been therapeutic. If today's reading is above 2 I will bring him back later this week for cardioversion. I will refer him to EP for consideration of ablation. After his back in sinus rhythm I plan an echocardiogram to reassess his ejection fraction which has been normal previously. I might consider going up on his dose of flecainide.  HTN:  His blood pressure is controlled. Continue the medications as listed.  TOBACCO ABUSE:  He says his only smoking a few cigarettes at a time. I have suggested complete abstinence.  ALCOHOL USE:  He reports only a few drinks at a time.   I have suggested complete abstinence.

## 2012-07-23 NOTE — Telephone Encounter (Signed)
Per pt call  -  His BP has been elevated and feels like he is back in At Fib  HR around 120 bpm thru the weekend.  appt given for eval to day at 10:15am with Dr Antoine Poche.

## 2012-07-26 ENCOUNTER — Ambulatory Visit (HOSPITAL_COMMUNITY)
Admission: RE | Admit: 2012-07-26 | Discharge: 2012-07-26 | Disposition: A | Discharge status: Home / Self Care | Payer: BC Managed Care – PPO | Source: Ambulatory Visit | Attending: Cardiology | Admitting: Cardiology

## 2012-07-26 ENCOUNTER — Ambulatory Visit (INDEPENDENT_AMBULATORY_CARE_PROVIDER_SITE_OTHER): Payer: BC Managed Care – PPO

## 2012-07-26 ENCOUNTER — Encounter (HOSPITAL_COMMUNITY)
Admission: RE | Disposition: A | Discharge status: Home / Self Care | Payer: Self-pay | Source: Ambulatory Visit | Attending: Cardiology

## 2012-07-26 ENCOUNTER — Ambulatory Visit (HOSPITAL_COMMUNITY): Payer: BC Managed Care – PPO | Admitting: Anesthesiology

## 2012-07-26 ENCOUNTER — Telehealth: Payer: Self-pay | Admitting: *Deleted

## 2012-07-26 ENCOUNTER — Encounter (HOSPITAL_COMMUNITY): Payer: Self-pay | Admitting: Gastroenterology

## 2012-07-26 ENCOUNTER — Encounter (HOSPITAL_COMMUNITY): Payer: Self-pay | Admitting: Anesthesiology

## 2012-07-26 DIAGNOSIS — I4891 Unspecified atrial fibrillation: Secondary | ICD-10-CM

## 2012-07-26 DIAGNOSIS — J438 Other emphysema: Secondary | ICD-10-CM | POA: Insufficient documentation

## 2012-07-26 DIAGNOSIS — Z7901 Long term (current) use of anticoagulants: Secondary | ICD-10-CM

## 2012-07-26 DIAGNOSIS — I4892 Unspecified atrial flutter: Secondary | ICD-10-CM | POA: Insufficient documentation

## 2012-07-26 DIAGNOSIS — E119 Type 2 diabetes mellitus without complications: Secondary | ICD-10-CM | POA: Insufficient documentation

## 2012-07-26 DIAGNOSIS — I10 Essential (primary) hypertension: Secondary | ICD-10-CM | POA: Insufficient documentation

## 2012-07-26 HISTORY — PX: CARDIOVERSION: SHX1299

## 2012-07-26 LAB — POCT INR: INR: 2.5

## 2012-07-26 SURGERY — CARDIOVERSION
Anesthesia: Monitor Anesthesia Care | Laterality: Bilateral | Wound class: Clean

## 2012-07-26 MED ORDER — PROPOFOL 10 MG/ML IV BOLUS
INTRAVENOUS | Status: DC | PRN
  Administered 2012-07-26: 80 mg via INTRAVENOUS

## 2012-07-26 MED ORDER — SODIUM CHLORIDE 0.9 % IV SOLN
INTRAVENOUS | Status: DC
  Administered 2012-07-26: 500 mL via INTRAVENOUS

## 2012-07-26 MED ORDER — SODIUM CHLORIDE 0.9 % IJ SOLN: 3.0000 mL | Freq: Two times a day (BID) | INTRAMUSCULAR | Status: DC

## 2012-07-26 MED ORDER — SODIUM CHLORIDE 0.9 % IJ SOLN: 3.0000 mL | INTRAMUSCULAR | Status: DC | PRN

## 2012-07-26 MED ORDER — LIDOCAINE HCL (CARDIAC) 20 MG/ML IV SOLN
INTRAVENOUS | Status: DC | PRN
  Administered 2012-07-26: 30 mg via INTRAVENOUS

## 2012-07-26 MED ORDER — SODIUM CHLORIDE 0.9 % IV SOLN: 250.0000 mL | INTRAVENOUS | Status: DC

## 2012-07-26 NOTE — CV Procedure (Signed)
   Procedure:  DCCV  Procedure note:  Indication symptomatic atrial flutter.  Pre procedure ECG demonstrated flutter.   The patient signed informed consent.  He had been taking chronic anticoagulation with warfarin with therapuetic INR.  Anesthesia was prescribed by Dr. Katrinka Blazing with 80 mg Propofol.  He was successfully cardioverted with 75 J of biphasic synchronized energy.  He had resultant NSR.  There were no apparent complications.

## 2012-07-26 NOTE — Anesthesia Preprocedure Evaluation (Signed)
Anesthesia Evaluation  Patient identified by MRN, date of birth, ID band Patient awake    Reviewed: Allergy & Precautions, H&P , NPO status , Patient's Chart, lab work & pertinent test results  Airway Mallampati: I TM Distance: >3 FB Neck ROM: full    Dental   Pulmonary asthma , sleep apnea , COPD         Cardiovascular hypertension, + Peripheral Vascular Disease + dysrhythmias Atrial Fibrillation Rhythm:irregular Rate:Normal     Neuro/Psych  Neuromuscular disease    GI/Hepatic GERD-  ,  Endo/Other  diabetes, Type 2, Oral Hypoglycemic Agents  Renal/GU      Musculoskeletal   Abdominal   Peds  Hematology   Anesthesia Other Findings   Reproductive/Obstetrics                           Anesthesia Physical Anesthesia Plan  ASA: III  Anesthesia Plan: General   Post-op Pain Management:    Induction: Intravenous  Airway Management Planned: Mask  Additional Equipment:   Intra-op Plan:   Post-operative Plan:   Informed Consent: I have reviewed the patients History and Physical, chart, labs and discussed the procedure including the risks, benefits and alternatives for the proposed anesthesia with the patient or authorized representative who has indicated his/her understanding and acceptance.     Plan Discussed with: CRNA, Anesthesiologist and Surgeon  Anesthesia Plan Comments:         Anesthesia Quick Evaluation

## 2012-07-26 NOTE — Preoperative (Signed)
Beta Blockers   Reason not to administer Beta Blockers:Not Applicable 

## 2012-07-26 NOTE — Telephone Encounter (Signed)
Message copied by Sharin Grave on Thu Jul 26, 2012  4:53 PM ------      Message from: Rollene Rotunda      Created: Thu Jul 26, 2012  1:37 PM       He needs an echo and EP referral.  Thanks.   ------

## 2012-07-26 NOTE — Transfer of Care (Signed)
Im mediate Anesthesia Transfer of Care Note  Patient: [name]  Procedure(s) Performed: Procedure(s): CARDIOVERSION (Bilateral)  Patient Location: PACU  Anesthesia Type:MAC  Level of Consciousness: awake, alert , oriented and sedated  Airway & Oxygen Therapy: Patient Spontanous Breathing and Patient connected to nasal cannula oxygen  Post-op Assessment: Report given to PACU RN, Post -op Vital signs reviewed and stable and Patient moving all extremities  Post vital signs: Reviewed and stable  Complications: No apparent anesthesia complications

## 2012-07-26 NOTE — Anesthesia Postprocedure Evaluation (Signed)
  Anesthesia Post-op Note  Patient: [name]  Procedure(s) Performed: Procedure(s): CARDIOVERSION (Bilateral)  Patient Location: PACU  Anesthesia Type:MAC  Level of Consciousness: awake, alert , oriented and sedated  Airway and Oxygen Therapy: Patient Spontanous Breathing and Patient connected to nasal cannula oxygen  Post-op Pain: none  Post-op Assessment: Post-op Vital signs reviewed  Post-op Vital Signs: Reviewed and stable  Complications: No apparent anesthesia complications

## 2012-07-26 NOTE — H&P (View-Only) (Signed)
 HPI The patient presents for followup of atrial flutter. He has had atrial fibrillation treated with flecainide in the past. He had TEE/DCCV. He remains on anticoagulation. He says he occasionally has paroxysms of irregular rhythm but they may last for only a few hours at the most. He says he went back into what he thought was his flutter on Friday and it has been persistent since then. He feels the palpitations though he's not having any presyncope or syncope. He reports he is rarely drinking alcohol. He does have some chronic fatigue and says he sleeps poorly. He wears CPAP and just got a new mask which she hasn't started using yet.  He denies chest pressure, neck or arm discomfort.  He continues to be bothered by neuropathic leg pain.   Allergies  Allergen Reactions  . Latex Itching    Current Outpatient Prescriptions  Medication Sig Dispense Refill  . albuterol (ACCUNEB) 1.25 MG/3ML nebulizer solution Take 1 ampule by nebulization 2 (two) times daily.      . albuterol (PROVENTIL HFA;VENTOLIN HFA) 108 (90 BASE) MCG/ACT inhaler Inhale 2 puffs into the lungs every 4 (four) hours as needed.       . bisoprolol (ZEBETA) 10 MG tablet TAKE 1 TABLET BY MOUTH ONCE DAILY  30 tablet  1  . diazepam (VALIUM) 5 MG tablet Take 5 mg by mouth every 6 (six) hours as needed.      . flecainide (TAMBOCOR) 100 MG tablet Take 1 tablet (100 mg total) by mouth 2 (two) times daily.  60 tablet  10  . furosemide (LASIX) 40 MG tablet Take 40 mg by mouth 2 (two) times daily as needed. For swelling      . glimepiride (AMARYL) 1 MG tablet Take 1 mg by mouth daily before breakfast.       . hydrochlorothiazide (HYDRODIURIL) 25 MG tablet TAKE 1 TABLET BY MOUTH DAILY  30 tablet  5  . HYDROcodone-acetaminophen (NORCO) 10-325 MG per tablet Take 1 tablet by mouth every 4 (four) hours as needed.      . metFORMIN (GLUCOPHAGE) 500 MG tablet Take 500 mg by mouth 2 (two) times daily with a meal.       . omeprazole (PRILOSEC) 20 MG  capsule Take 30- 60 min before your first and last meals of the day      . potassium chloride (KLOR-CON 10) 10 MEQ tablet Take 1 tablet daily with lasix as needed      . pravastatin (PRAVACHOL) 80 MG tablet Take 1 tablet (80 mg total) by mouth at bedtime.  30 tablet  6  . warfarin (COUMADIN) 5 MG tablet Take as directed by coumadin clinic  45 tablet  2   No current facility-administered medications for this visit.    Past Medical History  Diagnosis Date  . HLD (hyperlipidemia)     takes Pravastatin daily  . Obesity   . Brain aneurysm 2009    questionable. A follow up CTA in 2009 showed no evidence of  . Tobacco abuse   . Overdose 2009    unintentional Flacanide overdose  . HTN (hypertension)     takes Prinizide daily  . Dyspnea     LHC 11/08: EF 60%, normal coronary arteries;  ETT-echo 2/11: normal at 71% PMHR  . Shortness of breath     lying/sitting/exertion  . Seasonal allergies     was on Claritin-stopped taking this pt states it wasn't helping  . Coughing   . History of bronchitis   15yrs ago  . COPD (chronic obstructive pulmonary disease)   . Emphysema   . PAF (paroxysmal atrial fibrillation)     echo 8/11: mild LVH, EF 60-65%, trivial MR, mod LAE, mild RAE, PASP 43-47, mild pulmo HTN;takes Flecanide and Propranalol daily  . Peripheral edema     takes Furosemide as needed  . Hypokalemia     hx of;pt states he was taking K+ but now eats a banana daily instead  . Dizziness   . Short-term memory loss   . Peripheral neuropathy   . Arthritis     lower back  . Chronic back pain     DDD/stenosis  . Bruises easily     takes Coumadin daily  . GERD (gastroesophageal reflux disease)     takes Omeprazole bid  . Colon polyps     9 polyps removed 10/13/11  . Urinary frequency   . Urinary urgency   . Diabetes mellitus     takes Metformin and Glimepiride daily  . History of shingles   . OSA (obstructive sleep apnea)     doesn't have money to get CPAP machine  . Pneumonia     . Asthma     Past Surgical History  Procedure Laterality Date  . Neck surgery  15yrs ago  . Right shoulder surgery  4-5yrs ago    cyst removed  . Tonsilectomy, adenoidectomy, bilateral myringotomy and tubes      as a cild  . Left inguinal hernia repair      as a child  . Tee without cardioversion  05/05/2011    Procedure: TRANSESOPHAGEAL ECHOCARDIOGRAM (TEE);  Surgeon: Brian S Crenshaw, MD;  Location: [facility];  Service: Cardiovascular;  Laterality: N/A;  . Cardioversion  05/05/2011    Procedure: CARDIOVERSION;  Surgeon: Brian S Crenshaw, MD;  Location: [facility];  Service: Cardiovascular;  Laterality: N/A;  . Throat surgery  4-5yrs ago    "thought " it was cancer but came back not  . Carpal tunnel release  99/2000    bilateral  . Appendectomy  2-3yrs ago  . Knee surgery  6-7yrs ago    left  . Cardiac catheterization  2008    no significant CAD  . Tonsillectomy    . Hernia repair    . Anterior cervical decomp/discectomy fusion  01/04/2012    Procedure: ANTERIOR CERVICAL DECOMPRESSION/DISCECTOMY FUSION 1 LEVEL/HARDWARE REMOVAL;  Surgeon: Jeffrey D Jenkins, MD;  Location: [facility];  Service: Neurosurgery;  Laterality: N/A;  explore cervical fusion Cervical six - seven  with removal of codman plate anterior cervical decompression with fusion interbody prothesis plating and bonegraft    ROS:  Burning in feet.  Otherwise as stated in the HPI and negative for all other systems.  PHYSICAL EXAM Ht 6' 3" (1.905 m)  Wt 257 lb (116.574 kg)  BMI 32.12 kg/m2 GENERAL:  Well appearing HEENT:  Pupils equal round and reactive, fundi not visualized, oral mucosa , edentulous, ruddy appearance NECK:  No jugular venous distention, waveform within normal limits, carotid upstroke brisk and symmetric, no bruits, no thyromegaly LYMPHATICS:  No cervical, inguinal adenopathy LUNGS:  Clear to auscultation bilaterally, diminished BS. BACK:  No CVA tenderness CHEST:  Unremarkable HEART:   PMI not displaced or sustained,S1 and S2 within normal limits, no S3, no S4, no clicks, no rubs, no murmurs, regular ABD:  Flat, positive bowel sounds normal in frequency in pitch, no bruits, no rebound, no guarding, no midline pulsatile mass, no hepatomegaly, no splenomegaly EXT:    2 plus pulses throughout, no edema, no cyanosis no clubbing SKIN:  No rashes no nodules NEURO:  Cranial nerves II through XII grossly intact, motor grossly intact throughout PSYCH:  Cognitively intact, oriented to person place and time  EKG:  Atrial flutter variable ventricular rate.  No acute ST T wave changes. Axis WNL.  07/23/2012  ASSESSMENT AND PLAN  ATRIAL FLUTTER:  The patient is back in atrial flutter and this seems to be more sustained. I am going to confirm that he has had therapeutic INR. The last 2 readings have been therapeutic. If today's reading is above 2 I will bring him back later this week for cardioversion. I will refer him to EP for consideration of ablation. After his back in sinus rhythm I plan an echocardiogram to reassess his ejection fraction which has been normal previously. I might consider going up on his dose of flecainide.  HTN:  His blood pressure is controlled. Continue the medications as listed.  TOBACCO ABUSE:  He says his only smoking a few cigarettes at a time. I have suggested complete abstinence.  ALCOHOL USE:  He reports only a few drinks at a time.   I have suggested complete abstinence. 

## 2012-07-26 NOTE — Interval H&P Note (Signed)
History and Physical Interval Note:  07/26/2012 11:41 AM  [name]  has presented today for surgery, with the diagnosis of aflutter  The various methods of treatment have been discussed with the patient and family. After consideration of risks, benefits and other options for treatment, the patient has consented to  Procedure(s): CARDIOVERSION (Bilateral) as a surgical intervention .  The patient's history has been reviewed, patient examined, no change in status, stable for surgery.  I have reviewed the patient's chart and labs.  Questions were answered to the patient's satisfaction.     Rollene Rotunda

## 2012-07-27 ENCOUNTER — Encounter (HOSPITAL_COMMUNITY): Payer: Self-pay | Admitting: Cardiology

## 2012-07-30 ENCOUNTER — Other Ambulatory Visit: Payer: Self-pay | Admitting: *Deleted

## 2012-07-30 MED ORDER — WARFARIN SODIUM 5 MG PO TABS: ORAL_TABLET | ORAL | Status: DC

## 2012-08-02 ENCOUNTER — Ambulatory Visit (HOSPITAL_COMMUNITY): Payer: BC Managed Care – PPO | Attending: Cardiology

## 2012-08-02 ENCOUNTER — Ambulatory Visit (INDEPENDENT_AMBULATORY_CARE_PROVIDER_SITE_OTHER): Payer: BC Managed Care – PPO

## 2012-08-02 DIAGNOSIS — E119 Type 2 diabetes mellitus without complications: Secondary | ICD-10-CM | POA: Insufficient documentation

## 2012-08-02 DIAGNOSIS — I10 Essential (primary) hypertension: Secondary | ICD-10-CM | POA: Insufficient documentation

## 2012-08-02 DIAGNOSIS — I4891 Unspecified atrial fibrillation: Secondary | ICD-10-CM

## 2012-08-02 DIAGNOSIS — I4892 Unspecified atrial flutter: Secondary | ICD-10-CM | POA: Insufficient documentation

## 2012-08-02 DIAGNOSIS — Z7901 Long term (current) use of anticoagulants: Secondary | ICD-10-CM

## 2012-08-02 DIAGNOSIS — E785 Hyperlipidemia, unspecified: Secondary | ICD-10-CM | POA: Insufficient documentation

## 2012-08-02 NOTE — Progress Notes (Signed)
Echocardiogram performed.  

## 2012-08-07 ENCOUNTER — Telehealth: Payer: Self-pay | Admitting: *Deleted

## 2012-08-07 NOTE — Telephone Encounter (Signed)
Message copied by Tarri Fuller on Tue Aug 07, 2012  3:43 PM ------      Message from: Rollene Rotunda      Created: Fri Aug 03, 2012  6:40 PM       EF OK. ------

## 2012-08-07 NOTE — Telephone Encounter (Signed)
pt notified about echo results with verbal understanding; pt does state though he does not feel that he has bounced back from DCCV 3/13 as fast as his 1st DCCV. pt states weak feeling in leg and feels fatigued. advised will let [facility]'s RN Pam know,  Pt states he is "concerned, but not worried" about sxms.

## 2012-08-16 NOTE — Telephone Encounter (Signed)
Left message for pt that I was calling to follow up on how he is doing and to call back if further problems

## 2012-08-21 ENCOUNTER — Other Ambulatory Visit: Payer: Self-pay | Admitting: *Deleted

## 2012-08-21 MED ORDER — FLECAINIDE ACETATE 100 MG PO TABS: 100.0000 mg | ORAL_TABLET | Freq: Two times a day (BID) | ORAL | Status: DC

## 2012-08-22 ENCOUNTER — Ambulatory Visit (INDEPENDENT_AMBULATORY_CARE_PROVIDER_SITE_OTHER): Payer: BC Managed Care – PPO | Admitting: Internal Medicine

## 2012-08-22 ENCOUNTER — Encounter: Payer: Self-pay | Admitting: *Deleted

## 2012-08-22 ENCOUNTER — Encounter: Payer: Self-pay | Admitting: Internal Medicine

## 2012-08-22 ENCOUNTER — Ambulatory Visit (INDEPENDENT_AMBULATORY_CARE_PROVIDER_SITE_OTHER): Payer: BC Managed Care – PPO | Admitting: *Deleted

## 2012-08-22 VITALS — BP 120/72 | HR 66 | Wt 260.8 lb

## 2012-08-22 DIAGNOSIS — Z720 Tobacco use: Secondary | ICD-10-CM

## 2012-08-22 DIAGNOSIS — I4891 Unspecified atrial fibrillation: Secondary | ICD-10-CM

## 2012-08-22 DIAGNOSIS — G4733 Obstructive sleep apnea (adult) (pediatric): Secondary | ICD-10-CM

## 2012-08-22 DIAGNOSIS — E669 Obesity, unspecified: Secondary | ICD-10-CM

## 2012-08-22 DIAGNOSIS — F17200 Nicotine dependence, unspecified, uncomplicated: Secondary | ICD-10-CM

## 2012-08-22 DIAGNOSIS — I10 Essential (primary) hypertension: Secondary | ICD-10-CM

## 2012-08-22 DIAGNOSIS — I4892 Unspecified atrial flutter: Secondary | ICD-10-CM

## 2012-08-22 DIAGNOSIS — Z7901 Long term (current) use of anticoagulants: Secondary | ICD-10-CM

## 2012-08-22 NOTE — Progress Notes (Signed)
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 male with a h/o obesity, OSA, and atrial flutter who presents for EP consultation.  He reports that he initially had atrial arrhythmias several years ago.  He was evaluated by Dr Jacinto Halim and told that he had afib (strips are not available).  He required cardioversion and was placed on flecainide.  He has been evaluated by Dr Antoine Poche since.  He has been documented to have sustained atrial flutter on several occasions and has required cardioversion for this (most recently 07/26/12).  He is appropriately anticoagulated with coumadin.   He says he occasionally has paroxysms of irregular rhythm but they may last for only a few hours at the most. He wears CPAP. Today, he denies symptoms of chest pain, shortness of breath, orthopnea, PND, lower extremity edema, dizziness, presyncope, syncope, or neurologic sequela. The patient is tolerating medications without difficulties and is otherwise without complaint today.   Past Medical History  Diagnosis Date  . HLD (hyperlipidemia)     takes Pravastatin daily  . Obesity   . Brain aneurysm 2009    questionable. A follow up CTA in 2009 showed no evidence of  . Tobacco abuse   . Overdose 2009    unintentional Flacanide overdose  . HTN (hypertension)     takes Prinizide daily  . Dyspnea     LHC 11/08: EF 60%, normal coronary arteries;  ETT-echo 2/11: normal at 71% PMHR  . Shortness of breath     lying/sitting/exertion  . Seasonal allergies     was on Claritin-stopped taking this pt states it wasn't helping  . Coughing   . History of bronchitis 39yrs ago  . COPD (chronic obstructive pulmonary disease)   . Emphysema   . Atrial flutter     echo 8/11: mild LVH, EF 60-65%, trivial MR, mod LAE, mild RAE, PASP 43-47, mild pulmo HTN;takes Flecanide and Propranalol daily  . Peripheral edema     takes Furosemide as needed  . Hypokalemia     hx of;pt  states he was taking K+ but now eats a banana daily instead  . Dizziness   . Short-term memory loss   . Peripheral neuropathy   . Arthritis     lower back  . Chronic back pain     DDD/stenosis  . Bruises easily     takes Coumadin daily  . GERD (gastroesophageal reflux disease)     takes Omeprazole bid  . Colon polyps     9 polyps removed 10/13/11  . Urinary frequency   . Urinary urgency   . Diabetes mellitus     takes Metformin and Glimepiride daily  . History of shingles   . OSA (obstructive sleep apnea)     noncompliant with CPAP  . Pneumonia    Past Surgical History  Procedure Laterality Date  . Neck surgery  26yrs ago  . Right shoulder surgery  4-78yrs ago    cyst removed  . Tonsilectomy, adenoidectomy, bilateral myringotomy and tubes      as a cild  . Left inguinal hernia repair      as a child  . Tee without cardioversion  05/05/2011    Procedure: TRANSESOPHAGEAL ECHOCARDIOGRAM (TEE);  Surgeon: Lewayne Bunting, MD;  Location: [facility] ENDOSCOPY;  Service: Cardiovascular;  Laterality: N/A;  . Cardioversion  05/05/2011    Procedure: CARDIOVERSION;  Surgeon: Lewayne Bunting, MD;  Location: [facility] ENDOSCOPY;  Service:  Cardiovascular;  Laterality: N/A;  . Throat surgery  4-4yrs ago    "thought " it was cancer but came back not  . Carpal tunnel release  99/2000    bilateral  . Appendectomy  2-70yrs ago  . Knee surgery  6-35yrs ago    left  . Cardiac catheterization  2008    no significant CAD  . Tonsillectomy    . Hernia repair    . Anterior cervical decomp/discectomy fusion  01/04/2012    Procedure: ANTERIOR CERVICAL DECOMPRESSION/DISCECTOMY FUSION 1 LEVEL/HARDWARE REMOVAL;  Surgeon: Cristi Loron, MD;  Location: [facility]UR[facility];  Service: Neurosurgery;  Laterality: N/A;  explore cervical fusion Cervical six - seven  with removal of codman plate anterior cervical decompression with fusion interbody prothesis plating and bonegraft  . Cardioversion Bilateral 07/26/2012     Procedure: CARDIOVERSION;  Surgeon: Rollene Rotunda, MD;  Location: [facility] ENDOSCOPY;  Service: Cardiovascular;  Laterality: Bilateral;    Current Outpatient Prescriptions  Medication Sig Dispense Refill  . albuterol (ACCUNEB) 1.25 MG/3ML nebulizer solution Take 1 ampule by nebulization 2 (two) times daily.      [name] albuterol (PROVENTIL HFA;VENTOLIN HFA) 108 (90 BASE) MCG/ACT inhaler Inhale 2 puffs into the lungs every 4 (four) hours as needed.       . bisoprolol (ZEBETA) 10 MG tablet TAKE 1 TABLET BY MOUTH ONCE DAILY  30 tablet  1  . diazepam (VALIUM) 5 MG tablet Take 5 mg by mouth every 6 (six) hours as needed.      . furosemide (LASIX) 40 MG tablet Take 40 mg by mouth 2 (two) times daily as needed. For swelling      . glimepiride (AMARYL) 1 MG tablet Take 1 mg by mouth daily before breakfast.       . hydrochlorothiazide (HYDRODIURIL) 25 MG tablet TAKE 1 TABLET BY MOUTH DAILY  30 tablet  5  . HYDROcodone-acetaminophen (NORCO) 10-325 MG per tablet Take 1 tablet by mouth every 4 (four) hours as needed.      . metFORMIN (GLUCOPHAGE) 500 MG tablet Take 500 mg by mouth 2 (two) times daily with a meal.       . Multiple Vitamin (MULTIVITAMIN) tablet Take 1 tablet by mouth daily.      [name] omeprazole (PRILOSEC) 20 MG capsule Take 30- 60 min before your first and last meals of the day      . potassium chloride (KLOR-CON 10) 10 MEQ tablet Take 1 tablet daily with lasix as needed      . pravastatin (PRAVACHOL) 80 MG tablet Take 1 tablet (80 mg total) by mouth at bedtime.  30 tablet  6  . warfarin (COUMADIN) 5 MG tablet Take as directed by coumadin clinic  45 tablet  3  . flecainide (TAMBOCOR) 100 MG tablet Take 1 tablet (100 mg total) by mouth 2 (two) times daily.  60 tablet  5   No current facility-administered medications for this visit.    Allergies  Allergen Reactions  . Latex Itching    History   Social History  . Marital Status: Married    Spouse Name: N/A    Number of Children: 2  . Years of  Education: N/A   Occupational History  . Mechanic     Social History Main Topics  . Smoking status: Current Every Day Smoker -- 0.30 packs/day for 40 years    Types: Cigarettes  . Smokeless tobacco: Never Used     Comment: cessation advised,  he is not ready  to quit  . Alcohol Use: 0.0 oz/week     Comment: couple of times a month liquor, he tells me he typically drinks 2-4 drinks most days but hasnt drank anything in 6 weeks  . Drug Use: No  . Sexually Active: Yes   Other Topics Concern  . Not on file   Social History Narrative   Daily caffeine(Mountain Dew)       Lives in [place] with spouse.   Unemployed due to chronic back/ leg pain          Family History  Problem Relation Age of Onset  . Colon cancer Neg Hx   . Anesthesia problems Neg Hx   . Hypotension Neg Hx   . Malignant hyperthermia Neg Hx   . Pseudochol deficiency Neg Hx     ROS- All systems are reviewed and negative except as per the HPI above  Physical Exam: Filed Vitals:   08/22/12 1506  BP: 120/72  Pulse: 66  Weight: 260 lb 12.8 oz (118.298 kg)    GEN- The patient is overweight appearing, alert and oriented x 3 today.   Head- normocephalic, atraumatic Eyes-  Sclera clear, conjunctiva pink Ears- hearing intact Oropharynx- clear Neck- supple, no JVP Lymph- no cervical lymphadenopathy Lungs- Clear to ausculation bilaterally, normal work of breathing Heart- Regular rate and rhythm, no murmurs, rubs or gallops, PMI not laterally displaced GI- soft, NT, ND, + BS Extremities- no clubbing, cyanosis, or edema MS- no significant deformity or atrophy Skin- no rash or lesion Psych- euthymic mood, full affect Neuro- strength and sensation are intact  EKG today reveals 66 bpm otherwise normal ekg Prior ekgs in epic are reviewed and reveal typical appearing atrial flutter Hospital records, Dr Lindaann Slough notes, and echo report are also reviewed  Assessment and Plan:

## 2012-08-22 NOTE — Patient Instructions (Addendum)

## 2012-08-23 ENCOUNTER — Other Ambulatory Visit: Payer: Self-pay | Admitting: *Deleted

## 2012-08-23 DIAGNOSIS — I4891 Unspecified atrial fibrillation: Secondary | ICD-10-CM

## 2012-08-24 ENCOUNTER — Encounter: Payer: Self-pay | Admitting: Internal Medicine

## 2012-08-24 NOTE — Assessment & Plan Note (Signed)
He carries a h/o afib though I do not have documentation of this. The importance of weight loss and compliance with CPAP/ ETOh avoidance were discussed at length today to avoid afib in the future. We may consider stopping flecainide eventually post ablation.  We will need to review records from Dr Jacinto Halim if possible to see if his initial arrhythmia was afib or atrial flutter before we can fully decide whether or not he requires long term anticoagulation.

## 2012-08-24 NOTE — Assessment & Plan Note (Signed)
Stable No change required today  

## 2012-08-24 NOTE — Assessment & Plan Note (Signed)
Weight loss is advised 

## 2012-08-24 NOTE — Assessment & Plan Note (Signed)
The patient has symptomatic recurrent typical appearing flutter.  He is appropriately anticoagulated with coumadin.  Therapeutic strategies for  Atrial flutter including medicine and ablation were discussed in detail with the patient today. Risk, benefits, and alternatives to EP study and radiofrequency ablation were also discussed in detail today. These risks include but are not limited to stroke, bleeding, vascular damage, tamponade, perforation, damage to the heart and other structures, AV block requiring pacemaker, worsening renal function, and death. The patient understands these risk and wishes to proceed.  We will therefore proceed with catheter ablation at the next available time.

## 2012-08-24 NOTE — Assessment & Plan Note (Signed)
Compliance with CPAP is essential

## 2012-08-24 NOTE — Assessment & Plan Note (Signed)
smoking cessation is highly advised He is not ready to quit

## 2012-09-03 ENCOUNTER — Encounter (HOSPITAL_COMMUNITY): Payer: Self-pay

## 2012-09-05 ENCOUNTER — Ambulatory Visit (INDEPENDENT_AMBULATORY_CARE_PROVIDER_SITE_OTHER): Payer: BC Managed Care – PPO | Admitting: Pharmacist

## 2012-09-05 ENCOUNTER — Telehealth: Payer: Self-pay | Admitting: *Deleted

## 2012-09-05 ENCOUNTER — Other Ambulatory Visit (INDEPENDENT_AMBULATORY_CARE_PROVIDER_SITE_OTHER): Payer: BC Managed Care – PPO

## 2012-09-05 ENCOUNTER — Telehealth: Payer: Self-pay | Admitting: Pulmonary Disease

## 2012-09-05 DIAGNOSIS — I4891 Unspecified atrial fibrillation: Secondary | ICD-10-CM

## 2012-09-05 DIAGNOSIS — Z7901 Long term (current) use of anticoagulants: Secondary | ICD-10-CM

## 2012-09-05 LAB — CBC WITH DIFFERENTIAL/PLATELET
Basophils Absolute: 0 10*3/uL (ref 0.0–0.1)
Basophils Relative: 0.2 % (ref 0.0–3.0)
Eosinophils Absolute: 0.1 10*3/uL (ref 0.0–0.7)
Lymphocytes Relative: 20.9 % (ref 12.0–46.0)
MCHC: 34.4 g/dL (ref 30.0–36.0)
Monocytes Relative: 8.2 % (ref 3.0–12.0)
Neutrophils Relative %: 69 % (ref 43.0–77.0)
RBC: 4.57 Mil/uL (ref 4.22–5.81)

## 2012-09-05 LAB — BASIC METABOLIC PANEL
Calcium: 9.1 mg/dL (ref 8.4–10.5)
Chloride: 101 mEq/L (ref 96–112)
Creatinine, Ser: 0.7 mg/dL (ref 0.4–1.5)
GFR: 124.53 mL/min (ref >60.00)

## 2012-09-05 LAB — PROTIME-INR: INR: 3.4 ratio — ABNORMAL HIGH (ref 0.8–1.0)

## 2012-09-05 LAB — POCT INR: INR: 3.3

## 2012-09-05 NOTE — Telephone Encounter (Signed)
I called pt to schedule pre/post spiro per RA  I called and spoke with pt and he is scheduled for 09/25/12 at 4:30. Nothing further was needed

## 2012-09-05 NOTE — Telephone Encounter (Signed)
Pt is requetsing a refill on HCTZ 25 mg. Please advise if okay to refill thanks

## 2012-09-06 NOTE — Telephone Encounter (Signed)
Defer to his PCP

## 2012-09-06 NOTE — Telephone Encounter (Signed)
I called and made pt aware

## 2012-09-10 ENCOUNTER — Ambulatory Visit (INDEPENDENT_AMBULATORY_CARE_PROVIDER_SITE_OTHER): Payer: BC Managed Care – PPO

## 2012-09-10 DIAGNOSIS — Z7901 Long term (current) use of anticoagulants: Secondary | ICD-10-CM

## 2012-09-10 DIAGNOSIS — I4891 Unspecified atrial fibrillation: Secondary | ICD-10-CM

## 2012-09-10 LAB — POCT INR: INR: 2

## 2012-09-11 ENCOUNTER — Ambulatory Visit (HOSPITAL_COMMUNITY)
Admission: RE | Admit: 2012-09-11 | Discharge: 2012-09-12 | Disposition: A | Discharge status: Home / Self Care | Payer: BC Managed Care – PPO | Source: Ambulatory Visit | Attending: Internal Medicine | Admitting: Internal Medicine

## 2012-09-11 ENCOUNTER — Ambulatory Visit (HOSPITAL_COMMUNITY): Payer: BC Managed Care – PPO | Admitting: Certified Registered Nurse Anesthetist

## 2012-09-11 ENCOUNTER — Encounter (HOSPITAL_COMMUNITY): Payer: Self-pay | Admitting: Certified Registered Nurse Anesthetist

## 2012-09-11 ENCOUNTER — Encounter (HOSPITAL_COMMUNITY)
Admission: RE | Disposition: A | Discharge status: Home / Self Care | Payer: Self-pay | Source: Ambulatory Visit | Attending: Internal Medicine

## 2012-09-11 DIAGNOSIS — I4892 Unspecified atrial flutter: Secondary | ICD-10-CM

## 2012-09-11 DIAGNOSIS — F1010 Alcohol abuse, uncomplicated: Secondary | ICD-10-CM | POA: Insufficient documentation

## 2012-09-11 DIAGNOSIS — G4733 Obstructive sleep apnea (adult) (pediatric): Secondary | ICD-10-CM

## 2012-09-11 DIAGNOSIS — J449 Chronic obstructive pulmonary disease, unspecified: Secondary | ICD-10-CM | POA: Insufficient documentation

## 2012-09-11 DIAGNOSIS — E119 Type 2 diabetes mellitus without complications: Secondary | ICD-10-CM | POA: Insufficient documentation

## 2012-09-11 DIAGNOSIS — J4489 Other specified chronic obstructive pulmonary disease: Secondary | ICD-10-CM | POA: Insufficient documentation

## 2012-09-11 DIAGNOSIS — Z720 Tobacco use: Secondary | ICD-10-CM

## 2012-09-11 DIAGNOSIS — E785 Hyperlipidemia, unspecified: Secondary | ICD-10-CM | POA: Insufficient documentation

## 2012-09-11 DIAGNOSIS — E669 Obesity, unspecified: Secondary | ICD-10-CM | POA: Insufficient documentation

## 2012-09-11 DIAGNOSIS — T8859XA Other complications of anesthesia, initial encounter: Secondary | ICD-10-CM

## 2012-09-11 DIAGNOSIS — I10 Essential (primary) hypertension: Secondary | ICD-10-CM | POA: Insufficient documentation

## 2012-09-11 DIAGNOSIS — J438 Other emphysema: Secondary | ICD-10-CM | POA: Insufficient documentation

## 2012-09-11 DIAGNOSIS — F17200 Nicotine dependence, unspecified, uncomplicated: Secondary | ICD-10-CM | POA: Insufficient documentation

## 2012-09-11 HISTORY — DX: Other complications of anesthesia, initial encounter: T88.59XA

## 2012-09-11 HISTORY — PX: ATRIAL FLUTTER ABLATION: SHX5733

## 2012-09-11 HISTORY — PX: ATRIAL FIBRILLATION ABLATION: SHX5456

## 2012-09-11 LAB — GLUCOSE, CAPILLARY
Glucose-Capillary: 158 mg/dL — ABNORMAL HIGH (ref 70–99)
Glucose-Capillary: 134 mg/dL — ABNORMAL HIGH (ref 70–99)
Glucose-Capillary: 143 mg/dL — ABNORMAL HIGH (ref 70–99)

## 2012-09-11 LAB — PROTIME-INR: Prothrombin Time: 20.8 seconds — ABNORMAL HIGH (ref 11.6–15.2)

## 2012-09-11 SURGERY — ATRIAL FIBRILLATION ABLATION
Anesthesia: Monitor Anesthesia Care

## 2012-09-11 MED ORDER — FUROSEMIDE 40 MG PO TABS
40.0000 mg | ORAL_TABLET | Freq: Every day | ORAL | Status: DC | PRN
  Filled 2012-09-11: qty 1

## 2012-09-11 MED ORDER — OXYCODONE HCL 5 MG PO TABS
5.0000 mg | ORAL_TABLET | Freq: Once | ORAL | Status: AC | PRN
  Administered 2012-09-11: 5 mg via ORAL

## 2012-09-11 MED ORDER — MIDAZOLAM HCL 2 MG/2ML IJ SOLN: 0.5000 mg | Freq: Once | INTRAMUSCULAR | Status: DC | PRN

## 2012-09-11 MED ORDER — MEPERIDINE HCL 25 MG/ML IJ SOLN: 6.2500 mg | INTRAMUSCULAR | Status: DC | PRN

## 2012-09-11 MED ORDER — METFORMIN HCL 500 MG PO TABS
500.0000 mg | ORAL_TABLET | Freq: Two times a day (BID) | ORAL | Status: DC
  Administered 2012-09-11 – 2012-09-12 (×2): 500 mg via ORAL
  Filled 2012-09-11 (×4): qty 1

## 2012-09-11 MED ORDER — SODIUM CHLORIDE 0.9 % IJ SOLN: 3.0000 mL | INTRAMUSCULAR | Status: DC | PRN

## 2012-09-11 MED ORDER — FLECAINIDE ACETATE 100 MG PO TABS
100.0000 mg | ORAL_TABLET | Freq: Two times a day (BID) | ORAL | Status: DC
  Administered 2012-09-11: 100 mg via ORAL
  Filled 2012-09-11 (×3): qty 1

## 2012-09-11 MED ORDER — GLIMEPIRIDE 1 MG PO TABS
1.0000 mg | ORAL_TABLET | Freq: Every day | ORAL | Status: DC
  Administered 2012-09-12: 1 mg via ORAL
  Filled 2012-09-11 (×2): qty 1

## 2012-09-11 MED ORDER — BUPIVACAINE HCL (PF) 0.25 % IJ SOLN
INTRAMUSCULAR | Status: AC
  Filled 2012-09-11: qty 30

## 2012-09-11 MED ORDER — BISOPROLOL FUMARATE 10 MG PO TABS
10.0000 mg | ORAL_TABLET | Freq: Every day | ORAL | Status: DC
  Filled 2012-09-11: qty 1

## 2012-09-11 MED ORDER — HYDROCODONE-ACETAMINOPHEN 10-325 MG PO TABS
1.0000 | ORAL_TABLET | ORAL | Status: DC | PRN
  Administered 2012-09-11: 1 via ORAL
  Filled 2012-09-11: qty 1

## 2012-09-11 MED ORDER — MIDAZOLAM HCL 5 MG/5ML IJ SOLN
INTRAMUSCULAR | Status: DC | PRN
  Administered 2012-09-11: 2 mg via INTRAVENOUS

## 2012-09-11 MED ORDER — WARFARIN - PHYSICIAN DOSING INPATIENT: Freq: Every day | Status: DC

## 2012-09-11 MED ORDER — WARFARIN SODIUM 5 MG PO TABS: 5.0000 mg | ORAL_TABLET | ORAL | Status: DC

## 2012-09-11 MED ORDER — PROMETHAZINE HCL 25 MG/ML IJ SOLN: 6.2500 mg | INTRAMUSCULAR | Status: DC | PRN

## 2012-09-11 MED ORDER — HYDROCHLOROTHIAZIDE 25 MG PO TABS
25.0000 mg | ORAL_TABLET | Freq: Every day | ORAL | Status: DC
  Filled 2012-09-11: qty 1

## 2012-09-11 MED ORDER — WARFARIN SODIUM 5 MG PO TABS: 5.0000 mg | ORAL_TABLET | Freq: Every day | ORAL | Status: DC

## 2012-09-11 MED ORDER — LIDOCAINE HCL (CARDIAC) 20 MG/ML IV SOLN
INTRAVENOUS | Status: DC | PRN
  Administered 2012-09-11: 40 mg via INTRAVENOUS

## 2012-09-11 MED ORDER — FENTANYL CITRATE 0.05 MG/ML IJ SOLN: 25.0000 ug | INTRAMUSCULAR | Status: DC | PRN

## 2012-09-11 MED ORDER — PHENYLEPHRINE HCL 10 MG/ML IJ SOLN
INTRAMUSCULAR | Status: DC | PRN
  Administered 2012-09-11 (×2): 40 ug via INTRAVENOUS

## 2012-09-11 MED ORDER — WARFARIN SODIUM 7.5 MG PO TABS
7.5000 mg | ORAL_TABLET | ORAL | Status: DC
  Administered 2012-09-11: 7.5 mg via ORAL
  Filled 2012-09-11 (×2): qty 1

## 2012-09-11 MED ORDER — SODIUM CHLORIDE 0.9 % IJ SOLN
3.0000 mL | Freq: Two times a day (BID) | INTRAMUSCULAR | Status: DC
  Administered 2012-09-11: 3 mL via INTRAVENOUS

## 2012-09-11 MED ORDER — ONDANSETRON HCL 4 MG/2ML IJ SOLN: 4.0000 mg | Freq: Four times a day (QID) | INTRAMUSCULAR | Status: DC | PRN

## 2012-09-11 MED ORDER — ADULT MULTIVITAMIN W/MINERALS CH
1.0000 | ORAL_TABLET | Freq: Every day | ORAL | Status: DC
  Filled 2012-09-11: qty 1

## 2012-09-11 MED ORDER — FENTANYL CITRATE 0.05 MG/ML IJ SOLN
INTRAMUSCULAR | Status: DC | PRN
  Administered 2012-09-11: 50 ug via INTRAVENOUS

## 2012-09-11 MED ORDER — SODIUM CHLORIDE 0.9 % IV SOLN
INTRAVENOUS | Status: DC | PRN
  Administered 2012-09-11: 12:00:00 via INTRAVENOUS

## 2012-09-11 MED ORDER — POTASSIUM CHLORIDE ER 10 MEQ PO TBCR
10.0000 meq | EXTENDED_RELEASE_TABLET | Freq: Every day | ORAL | Status: DC | PRN
  Filled 2012-09-11: qty 1

## 2012-09-11 MED ORDER — PROPOFOL 10 MG/ML IV BOLUS
INTRAVENOUS | Status: DC | PRN
  Administered 2012-09-11: 200 mg via INTRAVENOUS

## 2012-09-11 MED ORDER — OXYCODONE HCL 5 MG/5ML PO SOLN: 5.0000 mg | Freq: Once | ORAL | Status: AC | PRN

## 2012-09-11 MED ORDER — SODIUM CHLORIDE 0.9 % IV SOLN: 250.0000 mL | INTRAVENOUS | Status: DC | PRN

## 2012-09-11 MED ORDER — ACETAMINOPHEN 325 MG PO TABS: 650.0000 mg | ORAL_TABLET | ORAL | Status: DC | PRN

## 2012-09-11 NOTE — Transfer of Care (Signed)
Immediate Anesthesia Transfer of Care Note  Patient: [name]  Procedure(s) Performed: Procedure(s): ATRIAL FIBRILLATION ABLATION (N/A)  Patient Location: Cath Lab  Anesthesia Type:General  Level of Consciousness: awake, alert  and oriented  Airway & Oxygen Therapy: Patient Spontanous Breathing and Patient connected to nasal cannula oxygen  Post-op Assessment: Report given to PACU RN, Post -op Vital signs reviewed and stable and Patient moving all extremities X 4  Post vital signs: Reviewed and stable  Complications: No apparent anesthesia complications

## 2012-09-11 NOTE — H&P (View-Only) (Signed)
 Primary Care Physician: LITTLE,Nyra Anspaugh, MD Referring Physician:  Dr Hochrein   [name] is a 56 y.o. male with a h/o obesity, OSA, and atrial flutter who presents for EP consultation.  He reports that he initially had atrial arrhythmias several years ago.  He was evaluated by Dr Ganji and told that he had afib (strips are not available).  He required cardioversion and was placed on flecainide.  He has been evaluated by Dr Hochrein since.  He has been documented to have sustained atrial flutter on several occasions and has required cardioversion for this (most recently 07/26/12).  He is appropriately anticoagulated with coumadin.   He says he occasionally has paroxysms of irregular rhythm but they may last for only a few hours at the most. He wears CPAP. Today, he denies symptoms of chest pain, shortness of breath, orthopnea, PND, lower extremity edema, dizziness, presyncope, syncope, or neurologic sequela. The patient is tolerating medications without difficulties and is otherwise without complaint today.   Past Medical History  Diagnosis Date  . HLD (hyperlipidemia)     takes Pravastatin daily  . Obesity   . Brain aneurysm 2009    questionable. A follow up CTA in 2009 showed no evidence of  . Tobacco abuse   . Overdose 2009    unintentional Flacanide overdose  . HTN (hypertension)     takes Prinizide daily  . Dyspnea     LHC 11/08: EF 60%, normal coronary arteries;  ETT-echo 2/11: normal at 71% PMHR  . Shortness of breath     lying/sitting/exertion  . Seasonal allergies     was on Claritin-stopped taking this pt states it wasn't helping  . Coughing   . History of bronchitis 15yrs ago  . COPD (chronic obstructive pulmonary disease)   . Emphysema   . Atrial flutter     echo 8/11: mild LVH, EF 60-65%, trivial MR, mod LAE, mild RAE, PASP 43-47, mild pulmo HTN;takes Flecanide and Propranalol daily  . Peripheral edema     takes Furosemide as needed  . Hypokalemia     hx of;pt  states he was taking K+ but now eats a banana daily instead  . Dizziness   . Short-term memory loss   . Peripheral neuropathy   . Arthritis     lower back  . Chronic back pain     DDD/stenosis  . Bruises easily     takes Coumadin daily  . GERD (gastroesophageal reflux disease)     takes Omeprazole bid  . Colon polyps     9 polyps removed 10/13/11  . Urinary frequency   . Urinary urgency   . Diabetes mellitus     takes Metformin and Glimepiride daily  . History of shingles   . OSA (obstructive sleep apnea)     noncompliant with CPAP  . Pneumonia    Past Surgical History  Procedure Laterality Date  . Neck surgery  15yrs ago  . Right shoulder surgery  4-5yrs ago    cyst removed  . Tonsilectomy, adenoidectomy, bilateral myringotomy and tubes      as a cild  . Left inguinal hernia repair      as a child  . Tee without cardioversion  05/05/2011    Procedure: TRANSESOPHAGEAL ECHOCARDIOGRAM (TEE);  Surgeon: Brian S Crenshaw, MD;  Location: [facility];  Service: Cardiovascular;  Laterality: N/A;  . Cardioversion  05/05/2011    Procedure: CARDIOVERSION;  Surgeon: Brian S Crenshaw, MD;  Location: [facility];  Service:   Cardiovascular;  Laterality: N/A;  . Throat surgery  4-5yrs ago    "thought " it was cancer but came back not  . Carpal tunnel release  99/2000    bilateral  . Appendectomy  2-3yrs ago  . Knee surgery  6-7yrs ago    left  . Cardiac catheterization  2008    no significant CAD  . Tonsillectomy    . Hernia repair    . Anterior cervical decomp/discectomy fusion  01/04/2012    Procedure: ANTERIOR CERVICAL DECOMPRESSION/DISCECTOMY FUSION 1 LEVEL/HARDWARE REMOVAL;  Surgeon: Jeffrey D Jenkins, MD;  Location: [facility];  Service: Neurosurgery;  Laterality: N/A;  explore cervical fusion Cervical six - seven  with removal of codman plate anterior cervical decompression with fusion interbody prothesis plating and bonegraft  . Cardioversion Bilateral 07/26/2012     Procedure: CARDIOVERSION;  Surgeon: Devani Odonnel Hochrein, MD;  Location: [facility];  Service: Cardiovascular;  Laterality: Bilateral;    Current Outpatient Prescriptions  Medication Sig Dispense Refill  . albuterol (ACCUNEB) 1.25 MG/3ML nebulizer solution Take 1 ampule by nebulization 2 (two) times daily.      . albuterol (PROVENTIL HFA;VENTOLIN HFA) 108 (90 BASE) MCG/ACT inhaler Inhale 2 puffs into the lungs every 4 (four) hours as needed.       . bisoprolol (ZEBETA) 10 MG tablet TAKE 1 TABLET BY MOUTH ONCE DAILY  30 tablet  1  . diazepam (VALIUM) 5 MG tablet Take 5 mg by mouth every 6 (six) hours as needed.      . furosemide (LASIX) 40 MG tablet Take 40 mg by mouth 2 (two) times daily as needed. For swelling      . glimepiride (AMARYL) 1 MG tablet Take 1 mg by mouth daily before breakfast.       . hydrochlorothiazide (HYDRODIURIL) 25 MG tablet TAKE 1 TABLET BY MOUTH DAILY  30 tablet  5  . HYDROcodone-acetaminophen (NORCO) 10-325 MG per tablet Take 1 tablet by mouth every 4 (four) hours as needed.      . metFORMIN (GLUCOPHAGE) 500 MG tablet Take 500 mg by mouth 2 (two) times daily with a meal.       . Multiple Vitamin (MULTIVITAMIN) tablet Take 1 tablet by mouth daily.      . omeprazole (PRILOSEC) 20 MG capsule Take 30- 60 min before your first and last meals of the day      . potassium chloride (KLOR-CON 10) 10 MEQ tablet Take 1 tablet daily with lasix as needed      . pravastatin (PRAVACHOL) 80 MG tablet Take 1 tablet (80 mg total) by mouth at bedtime.  30 tablet  6  . warfarin (COUMADIN) 5 MG tablet Take as directed by coumadin clinic  45 tablet  3  . flecainide (TAMBOCOR) 100 MG tablet Take 1 tablet (100 mg total) by mouth 2 (two) times daily.  60 tablet  5   No current facility-administered medications for this visit.    Allergies  Allergen Reactions  . Latex Itching    History   Social History  . Marital Status: Married    Spouse Name: N/A    Number of Children: 2  . Years of  Education: N/A   Occupational History  . Mechanic     Social History Main Topics  . Smoking status: Current Every Day Smoker -- 0.30 packs/day for 40 years    Types: Cigarettes  . Smokeless tobacco: Never Used     Comment: cessation advised,  he is not ready   to quit  . Alcohol Use: 0.0 oz/week     Comment: couple of times a month liquor, he tells me he typically drinks 2-4 drinks most days but hasnt drank anything in 6 weeks  . Drug Use: No  . Sexually Active: Yes   Other Topics Concern  . Not on file   Social History Narrative   Daily caffeine(Mountain Dew)       Lives in [place] with spouse.   Unemployed due to chronic back/ leg pain          Family History  Problem Relation Age of Onset  . Colon cancer Neg Hx   . Anesthesia problems Neg Hx   . Hypotension Neg Hx   . Malignant hyperthermia Neg Hx   . Pseudochol deficiency Neg Hx     ROS- All systems are reviewed and negative except as per the HPI above  Physical Exam: Filed Vitals:   08/22/12 1506  BP: 120/72  Pulse: 66  Weight: 260 lb 12.8 oz (118.298 kg)    GEN- The patient is overweight appearing, alert and oriented x 3 today.   Head- normocephalic, atraumatic Eyes-  Sclera clear, conjunctiva pink Ears- hearing intact Oropharynx- clear Neck- supple, no JVP Lymph- no cervical lymphadenopathy Lungs- Clear to ausculation bilaterally, normal work of breathing Heart- Regular rate and rhythm, no murmurs, rubs or gallops, PMI not laterally displaced GI- soft, NT, ND, + BS Extremities- no clubbing, cyanosis, or edema MS- no significant deformity or atrophy Skin- no rash or lesion Psych- euthymic mood, full affect Neuro- strength and sensation are intact  EKG today reveals 66 bpm otherwise normal ekg Prior ekgs in epic are reviewed and reveal typical appearing atrial flutter Hospital records, Dr Hochreins notes, and echo report are also reviewed  Assessment and Plan:  

## 2012-09-11 NOTE — Anesthesia Postprocedure Evaluation (Signed)
  Anesthesia Post-op Note  Patient: [name]  Procedure(s) Performed: Procedure(s): ATRIAL FIBRILLATION ABLATION (N/A)  Patient Location: PACU and Cath Lab  Anesthesia Type:General  Level of Consciousness: awake, alert , oriented and patient cooperative  Airway and Oxygen Therapy: Patient Spontanous Breathing and Patient connected to nasal cannula oxygen  Post-op Pain: mild  Post-op Assessment: Post-op Vital signs reviewed, Patient's Cardiovascular Status Stable, Respiratory Function Stable, Patent Airway, No signs of Nausea or vomiting and Pain level controlled  Post-op Vital Signs: Reviewed and stable  Complications: No apparent anesthesia complications

## 2012-09-11 NOTE — Interval H&P Note (Signed)
History and Physical Interval Note:  09/11/2012 11:36 AM  [name]  has presented today for surgery, with the diagnosis of atrial flutter.  The various methods of treatment have been discussed with the patient and family. After consideration of risks, benefits and other options for treatment, the patient has consented to atrial flutter ablation  as a surgical intervention .  The patient's history has been reviewed, patient examined, no change in status, stable for surgery.  I have reviewed the patient's chart and labs.  Questions were answered to the patient's satisfaction.     Hillis Range

## 2012-09-11 NOTE — Anesthesia Procedure Notes (Signed)
Procedure Name: LMA Insertion Date/Time: 09/11/2012 12:28 PM Performed by: Orvilla Fus A Pre-anesthesia Checklist: Patient identified, Timeout performed, Emergency Drugs available, Suction available and Patient being monitored Patient Re-evaluated:Patient Re-evaluated prior to inductionOxygen Delivery Method: Circle system utilized Preoxygenation: Pre-oxygenation with 100% oxygen Intubation Type: IV induction Ventilation: Mask ventilation without difficulty LMA: LMA with gastric port inserted LMA Size: 4.0 Number of attempts: 1 Placement Confirmation: breath sounds checked- equal and bilateral and positive ETCO2 Tube secured with: Tape Dental Injury: Teeth and Oropharynx as per pre-operative assessment

## 2012-09-11 NOTE — Preoperative (Signed)
Beta Blockers   Reason not to administer Beta Blockers:Not Applicable 

## 2012-09-11 NOTE — Anesthesia Preprocedure Evaluation (Addendum)
Anesthesia Evaluation  Patient identified by MRN, date of birth, ID band Patient awake    Reviewed: Allergy & Precautions, H&P , NPO status , Patient's Chart, lab work & pertinent test results, reviewed documented beta blocker date and time   Airway Mallampati: II TM Distance: >3 FB Neck ROM: full    Dental  (+) Edentulous Upper, Edentulous Lower and Dental Advisory Given   Pulmonary shortness of breath and with exertion, asthma , sleep apnea (noncompliant with CPAP) , pneumonia -, COPD COPD inhaler,  breath sounds clear to auscultation        Cardiovascular hypertension, Pt. on home beta blockers + Peripheral Vascular Disease + dysrhythmias Atrial Fibrillation Rhythm:regular  07/2012 Echo EF 55-65% moderate LVH    Neuro/Psych  Neuromuscular disease negative psych ROS   GI/Hepatic Neg liver ROS, GERD-  Medicated,  Endo/Other  diabetes, Type 2, Oral Hypoglycemic AgentsCBG 158  Renal/GU negative Renal ROS  negative genitourinary   Musculoskeletal   Abdominal (+) + obese,   Peds  Hematology negative hematology ROS (+)   Anesthesia Other Findings See surgeon's H&P   Reproductive/Obstetrics negative OB ROS                         Anesthesia Physical Anesthesia Plan  ASA: III  Anesthesia Plan: General   Post-op Pain Management:    Induction: Intravenous  Airway Management Planned: LMA  Additional Equipment:   Intra-op Plan:   Post-operative Plan: Extubation in OR  Informed Consent: I have reviewed the patients History and Physical, chart, labs and discussed the procedure including the risks, benefits and alternatives for the proposed anesthesia with the patient or authorized representative who has indicated his/her understanding and acceptance.   Dental Advisory Given  Plan Discussed with: CRNA and Surgeon  Anesthesia Plan Comments:         Anesthesia Quick Evaluation

## 2012-09-11 NOTE — Op Note (Signed)
PREPROCEDURE DIAGNOSIS: Typical-appearing atrial flutter.   POSTPROCEDURE DIAGNOSIS: Isthmus-dependent right atrial flutter.   PROCEDURES:  1. Comprehensive EP study.  2. Coronary sinus pacing and recording.  3. Mapping of atrial flutter 4. Ablation of atrial flutter   INTRODUCTION:  [name] is a 57 y.o. male with a history of symptomatic typical-appearing atrial flutter. He presents today for EP study and radiofrequency ablation.   DESCRIPTION OF THE PROCEDURE: Informed written consent was obtained, and the patient was brought to the electrophysiology lab in the fasting state. The patient was then adequately sedated with anesthesia as outlined in the anesthesia report. The patient's right groin was prepped and draped in the usual sterile fashion by the EP lab staff. Using a percutaneous Seldinger technique a 6, 7, and 8-French hemostasis sheaths were placed into the right common femoral vein. A 7- [employer] decapolar coronary sinus catheter was introduced through the right common femoral vein and advanced into the coronary sinus for recording and pacing from this location. A 6-French quadripolar Josephson catheter was introduced through the right common femoral vein and advanced into the right ventricle for recording and pacing. This catheter was then pulled back to the His bundle location. The patient presented to the electrophysiology lab in normal sinus rhythm. His PR interval measured 133 msec with a QRS of 97 msec and qt interval 420 msec.  The average RR interval 948 msec.  The AH interval measured 85 msec with an HV interval of 51 msec.  Ventricular pacing was performed which revealed VA dissociation when pacing at a cycle length of 600 msec.  Rapid atrial pacing was performed which revealed no evidence of PR > RR.  Tachycardia was induced with RAP at a cycle length of 270 msec.   The surface electrocardiogram was consistent with typical atrial flutter. The coronary sinus  activation sequence was proximal to distal and suggestive of right atrial flutter.  The atrial flutter cycle length was 320 msec. Atrial entrainment mapping was then performed. When pacing from the left atrium, a long post pacing interval was observed. With entrainment mapping from the cavotricuspid isthmus, the post pacing interval was equal to the tachycardia cycle length and therefore suggestive of isthmus-dependent right atrial flutter.   I elected to perform cavotricuspid isthmus ablation.   A [employer] 7-French 10mm ablation catheter was introduced through the right common femoral vein and advanced into the right atrium. Mapping of the cavotricuspid isthmus was performed which revealed a standard isthmus. A series of 14 radiofrequency applications were delivered along the cavotricuspid isthmus with a target temperature of 60 degrees with power of 70 watts. During the 9 th radiofrequency ablation lesion, the tachycardia slowed and then terminated. The patient remained in sinus rhythm thereafter. An additional 5 radiofrequency applications were then delivered with similar parameters in order to obtain complete bidirectional cavotricuspid isthmus block.   Following ablation, differential atrial pacing was performed from the low lateral right atrium. This confirmed complete bidirectional cavotricuspid isthmus  block with a stimulus to earliest atrial activation recorded bidirectional across the isthmus measuring 180 msec. The patient was observed for 20 minutes without return of conduction through the isthmus.   Following ablation, the AH interval measured 100 msec with an HV interval of 52 msec. Rapid atrial pacing was performed, which revealed an AV Wenckebach cycle length of 370 msec with no evidence of PR greater than RR and no tachycardias induced when pacing down to a cycle length of 250 msec.  The procedure  was therefore considered completed. All catheters were removed, and the sheaths were  aspirated and flushed. The sheaths were removed and hemostasis was assured. There were no early apparent complications.   CONCLUSIONS:  1. Isthmus-dependent right atrial flutter induced and successfully ablated along the usual cavotricuspid isthmus.  2. Complete bidirectional cavotricuspid isthmus block achieved.  3. No inducible arrhythmias following ablation.  4. No early apparent complications.  Fayrene Fearing Shaundrea Carrigg,MD 09/11/2012 2:04 PM

## 2012-09-11 NOTE — Progress Notes (Signed)
Received from cath lab. Assessed per flow sheet. Denies chest pain or shortness of breath. Oxycodone 5mg  po given per chronic back pain. Post activity and precautions explained. Right groin dry/intact with no bleeding or hematoma noted. Call bell and family  Near. [name]

## 2012-09-12 DIAGNOSIS — G4733 Obstructive sleep apnea (adult) (pediatric): Secondary | ICD-10-CM

## 2012-09-12 DIAGNOSIS — F17200 Nicotine dependence, unspecified, uncomplicated: Secondary | ICD-10-CM

## 2012-09-12 DIAGNOSIS — I4892 Unspecified atrial flutter: Secondary | ICD-10-CM

## 2012-09-12 NOTE — Discharge Summary (Signed)
ELECTROPHYSIOLOGY PROCEDURE DISCHARGE SUMMARY    Patient ID: [name],  MRN: [id number], DOB/AGE: 1955/12/11 57 y.o.  Admit date: 09/11/2012 Discharge date: 09/12/2012  Primary Care Physician: Aida Puffer, MD Primary Cardiologist: Rollene Rotunda, MD Electrophysiologist: Hillis Range, MD  Primary Discharge Diagnosis:  Atrial flutter status post ablation this admission  Secondary Discharge Diagnosis:  1.  Sleep apnea- on CPAP 2.  Hyperlipidemia 3.  Obesity 4.  Hypertension 5.  COPD 6.  Tobacco abuse 7.  ETOH use  Procedures This Admission:  1.  Electrophysiology study and radiofrequency catheter ablation of atrial flutter on 09-11-2012 by Dr Johney Frame.  This study demonstrated typical atrial flutter which was successfully ablated along the cavotricuspid isthmus.  There were no inducible arrhythmias following ablation and no early apparent complications.   Brief HPI: KAINE MCQUILLEN is a 57 y.o. male with a history of symptomatic typical-appearing atrial flutter. He presents today for EP study and radiofrequency ablation.  Hospital Course:  The patient was admitted and underwent EPS/RFCA of atrial flutter on 09-11-2012 by Dr Johney Frame with details as outlined above.  He was monitored on telemetry overnight which demonstrated sinus brady/sinus rhythm.  His groin was without complication.  Dr Johney Frame examined the patient and considered him stable for discharge to home.  Tobacco and alcohol cessation were strongly advised as well as CPAP compliance.   Discharge Vitals: Blood pressure 136/78, pulse 74, temperature 98.5 F (36.9 C), temperature source Oral, resp. rate 18, height 6\' 3"  (1.905 m), weight 255 lb (115.667 kg), SpO2 96.00%.    Labs:   Lab Results  Component Value Date   WBC 5.5 09/05/2012   HGB 14.2 09/05/2012   HCT 41.2 09/05/2012   MCV 90.1 09/05/2012   PLT 117.0* 09/05/2012     Lab Results  Component Value Date   CHOL 180 09/08/2011   CHOL 287* 07/13/2011   CHOL   Value: 161        ATP III CLASSIFICATION:  <200     mg/dL   Desirable  409-811  mg/dL   Borderline High  >=914    mg/dL   High 7/82/9562   Lab Results  Component Value Date   HDL 33.80* 09/08/2011   HDL 34.40* 07/13/2011   HDL 25* 02/13/2008   Lab Results  Component Value Date   TRIG 294.0* 09/08/2011   TRIG 452.0 Triglyceride is over 400; calculations on Lipids are invalid.* 07/13/2011   TRIG 256* 02/13/2008   Lab Results  Component Value Date   CHOLHDL 5 09/08/2011   CHOLHDL 8 07/13/2011   CHOLHDL 6.4 02/13/2008   Lab Results  Component Value Date   LDLDIRECT 99.9 09/08/2011   LDLDIRECT 170.6 07/13/2011    Discharge Medications:    Medication List    TAKE these medications       albuterol 1.25 MG/3ML nebulizer solution  Commonly known as:  ACCUNEB  Take 1 ampule by nebulization daily as needed for shortness of breath.     bisoprolol 10 MG tablet  Commonly known as:  ZEBETA  Take 10 mg by mouth daily.     flecainide 100 MG tablet  Commonly known as:  TAMBOCOR  Take 1 tablet (100 mg total) by mouth 2 (two) times daily.     furosemide 40 MG tablet  Commonly known as:  LASIX  Take 40 mg by mouth daily as needed (swelling). For swelling     glimepiride 1 MG tablet  Commonly known as:  AMARYL  Take  1 mg by mouth daily before breakfast.     hydrochlorothiazide 25 MG tablet  Commonly known as:  HYDRODIURIL  Take 25 mg by mouth daily.     HYDROcodone-acetaminophen 10-325 MG per tablet  Commonly known as:  NORCO  Take 1 tablet by mouth every 4 (four) hours as needed for pain.     KLOR-CON 10 10 MEQ tablet  Generic drug:  potassium chloride  Take 10 mEq by mouth daily as needed (when take lasix dose).     metFORMIN 500 MG tablet  Commonly known as:  GLUCOPHAGE  Take 500 mg by mouth 2 (two) times daily with a meal.     multivitamin with minerals Tabs  Take 1 tablet by mouth daily.     omeprazole 20 MG capsule  Commonly known as:  PRILOSEC  Take 20 mg by mouth 2  (two) times daily.     pravastatin 80 MG tablet  Commonly known as:  PRAVACHOL  Take 1 tablet (80 mg total) by mouth at bedtime.     warfarin 5 MG tablet  Commonly known as:  COUMADIN  Take 5-7.5 mg by mouth daily. Take 1.5 tablets (7.5mg ) daily except every Sunday takes 5mg         Disposition:       Future Appointments Provider Department Dept Phone   09/18/2012 7:30 AM [facility] [phone]   09/25/2012 4:30 PM [facility] [phone]   10/17/2012 12:15 PM Hillis Range, MD [facility] Main Office [place]) [phone]   01/07/2013 9:00 AM Julio Sicks, NP  [facility] [phone]     Follow-up Information   Follow up with Hillis Range, MD On 10/17/2012. (12:15)    Contact information:   [address] [place] [address] [phone]       Follow up with [facility] On 09/18/2012. (7:30am)    Contact information:   [address], [address] [place] [address] [phone]      Duration of Discharge Encounter: Greater than 30 minutes including physician time.   Hillis Range, MD

## 2012-09-12 NOTE — Progress Notes (Signed)
   SUBJECTIVE: The patient is doing well today.  At this time, he denies chest pain, shortness of breath, or any new concerns.  Pt s/p RFCA atrial flutter 09-11-2012  . bisoprolol  10 mg Oral Daily  . flecainide  100 mg Oral BID  . glimepiride  1 mg Oral QAC breakfast  . hydrochlorothiazide  25 mg Oral Daily  . metFORMIN  500 mg Oral BID WC  . multivitamin with minerals  1 tablet Oral Daily  . sodium chloride  3 mL Intravenous Q12H  . [START ON 09/16/2012] warfarin  5 mg Oral Q Sun-1800  . warfarin  7.5 mg Oral Custom  . Warfarin - Physician Dosing Inpatient   Does not apply q1800      OBJECTIVE: Physical Exam: Filed Vitals:   09/11/12 1730 09/11/12 1930 09/11/12 1948 09/12/12 0414  BP: 132/75 128/75 135/76 127/72  Pulse: 61 65 65 64  Temp:   98.9 F (37.2 C) 98.2 F (36.8 C)  TempSrc:   Oral Oral  Resp: 16 16 18 18   Height:      Weight:      SpO2:  95% 95% 95%    Intake/Output Summary (Last 24 hours) at 09/12/12 4098 Last data filed at 09/12/12 0200  Gross per 24 hour  Intake    940 ml  Output    775 ml  Net    165 ml    Telemetry reveals sinus rhythm, short run atrial tach last night  GEN- The patient is well appearing, alert and oriented x 3 today.   Head- normocephalic, atraumatic Eyes-  Sclera clear, conjunctiva pink Ears- hearing intact Oropharynx- clear Neck- supple, no JVP Lymph- no cervical lymphadenopathy Lungs- few expiratory wheezes, normal work of breathing Heart- Regular rate and rhythm  GI- soft, NT, ND, + BS Extremities- no clubbing, cyanosis, or edema, no hematoma/ bruits Skin- no rash or lesion Psych- euthymic mood, full affect Neuro- strength and sensation are intact .  ASSESSMENT AND PLAN:  Active Problems:   Atrial flutter 1. Atrial flutter Doing well s/p ablation Continue current medical regimen  2. Tobacco Cessation strongly advised  3. OSA- compliance with CPAP advised  4. ETOH Avoidance advised  Routine follow up  scheduled.  Pt instructed on the importance of tobacco and alcohol cessation.

## 2012-09-13 ENCOUNTER — Emergency Department (HOSPITAL_COMMUNITY): Payer: BC Managed Care – PPO

## 2012-09-13 ENCOUNTER — Telehealth: Payer: Self-pay | Admitting: Internal Medicine

## 2012-09-13 ENCOUNTER — Emergency Department (HOSPITAL_COMMUNITY)
Admission: EM | Admit: 2012-09-13 | Discharge: 2012-09-13 | Disposition: A | Discharge status: Home / Self Care | Payer: BC Managed Care – PPO | Attending: Emergency Medicine | Admitting: Emergency Medicine

## 2012-09-13 ENCOUNTER — Encounter (HOSPITAL_COMMUNITY): Payer: Self-pay | Admitting: Adult Health

## 2012-09-13 DIAGNOSIS — F17200 Nicotine dependence, unspecified, uncomplicated: Secondary | ICD-10-CM | POA: Insufficient documentation

## 2012-09-13 DIAGNOSIS — E119 Type 2 diabetes mellitus without complications: Secondary | ICD-10-CM | POA: Insufficient documentation

## 2012-09-13 DIAGNOSIS — E669 Obesity, unspecified: Secondary | ICD-10-CM | POA: Insufficient documentation

## 2012-09-13 DIAGNOSIS — M1990 Unspecified osteoarthritis, unspecified site: Secondary | ICD-10-CM | POA: Insufficient documentation

## 2012-09-13 DIAGNOSIS — Z8639 Personal history of other endocrine, nutritional and metabolic disease: Secondary | ICD-10-CM | POA: Insufficient documentation

## 2012-09-13 DIAGNOSIS — M549 Dorsalgia, unspecified: Secondary | ICD-10-CM | POA: Insufficient documentation

## 2012-09-13 DIAGNOSIS — I10 Essential (primary) hypertension: Secondary | ICD-10-CM | POA: Insufficient documentation

## 2012-09-13 DIAGNOSIS — J438 Other emphysema: Secondary | ICD-10-CM | POA: Insufficient documentation

## 2012-09-13 DIAGNOSIS — Z8619 Personal history of other infectious and parasitic diseases: Secondary | ICD-10-CM | POA: Insufficient documentation

## 2012-09-13 DIAGNOSIS — Z860100 Personal history of colon polyps, unspecified: Secondary | ICD-10-CM | POA: Insufficient documentation

## 2012-09-13 DIAGNOSIS — Z91040 Latex allergy status: Secondary | ICD-10-CM | POA: Insufficient documentation

## 2012-09-13 DIAGNOSIS — Z8739 Personal history of other diseases of the musculoskeletal system and connective tissue: Secondary | ICD-10-CM | POA: Insufficient documentation

## 2012-09-13 DIAGNOSIS — J449 Chronic obstructive pulmonary disease, unspecified: Secondary | ICD-10-CM | POA: Insufficient documentation

## 2012-09-13 DIAGNOSIS — M129 Arthropathy, unspecified: Secondary | ICD-10-CM | POA: Insufficient documentation

## 2012-09-13 DIAGNOSIS — I480 Paroxysmal atrial fibrillation: Secondary | ICD-10-CM

## 2012-09-13 DIAGNOSIS — G8929 Other chronic pain: Secondary | ICD-10-CM | POA: Insufficient documentation

## 2012-09-13 DIAGNOSIS — Z8709 Personal history of other diseases of the respiratory system: Secondary | ICD-10-CM | POA: Insufficient documentation

## 2012-09-13 DIAGNOSIS — Z862 Personal history of diseases of the blood and blood-forming organs and certain disorders involving the immune mechanism: Secondary | ICD-10-CM | POA: Insufficient documentation

## 2012-09-13 DIAGNOSIS — Z87448 Personal history of other diseases of urinary system: Secondary | ICD-10-CM | POA: Insufficient documentation

## 2012-09-13 DIAGNOSIS — Z872 Personal history of diseases of the skin and subcutaneous tissue: Secondary | ICD-10-CM | POA: Insufficient documentation

## 2012-09-13 DIAGNOSIS — K219 Gastro-esophageal reflux disease without esophagitis: Secondary | ICD-10-CM | POA: Insufficient documentation

## 2012-09-13 DIAGNOSIS — R609 Edema, unspecified: Secondary | ICD-10-CM | POA: Insufficient documentation

## 2012-09-13 DIAGNOSIS — Z79899 Other long term (current) drug therapy: Secondary | ICD-10-CM | POA: Insufficient documentation

## 2012-09-13 DIAGNOSIS — J4489 Other specified chronic obstructive pulmonary disease: Secondary | ICD-10-CM | POA: Insufficient documentation

## 2012-09-13 DIAGNOSIS — I4892 Unspecified atrial flutter: Secondary | ICD-10-CM | POA: Insufficient documentation

## 2012-09-13 DIAGNOSIS — G4733 Obstructive sleep apnea (adult) (pediatric): Secondary | ICD-10-CM | POA: Insufficient documentation

## 2012-09-13 DIAGNOSIS — E785 Hyperlipidemia, unspecified: Secondary | ICD-10-CM | POA: Insufficient documentation

## 2012-09-13 DIAGNOSIS — Z8701 Personal history of pneumonia (recurrent): Secondary | ICD-10-CM | POA: Insufficient documentation

## 2012-09-13 DIAGNOSIS — Z8679 Personal history of other diseases of the circulatory system: Secondary | ICD-10-CM | POA: Insufficient documentation

## 2012-09-13 DIAGNOSIS — I4891 Unspecified atrial fibrillation: Secondary | ICD-10-CM

## 2012-09-13 DIAGNOSIS — Z7901 Long term (current) use of anticoagulants: Secondary | ICD-10-CM | POA: Insufficient documentation

## 2012-09-13 DIAGNOSIS — M519 Unspecified thoracic, thoracolumbar and lumbosacral intervertebral disc disorder: Secondary | ICD-10-CM | POA: Insufficient documentation

## 2012-09-13 DIAGNOSIS — Z8669 Personal history of other diseases of the nervous system and sense organs: Secondary | ICD-10-CM | POA: Insufficient documentation

## 2012-09-13 DIAGNOSIS — Z8719 Personal history of other diseases of the digestive system: Secondary | ICD-10-CM | POA: Insufficient documentation

## 2012-09-13 LAB — BASIC METABOLIC PANEL
CO2: 30 mEq/L (ref 19–32)
Calcium: 10.1 mg/dL (ref 8.4–10.5)
Glucose, Bld: 233 mg/dL — ABNORMAL HIGH (ref 70–99)
Potassium: 3.3 mEq/L — ABNORMAL LOW (ref 3.5–5.1)
Sodium: 142 mEq/L (ref 135–145)

## 2012-09-13 LAB — CBC
Hemoglobin: 15 g/dL (ref 13.0–17.0)
MCH: 31.1 pg (ref 26.0–34.0)
RBC: 4.82 MIL/uL (ref 4.22–5.81)

## 2012-09-13 LAB — POCT I-STAT TROPONIN I: Troponin i, poc: 0.15 ng/mL (ref 0.00–0.08)

## 2012-09-13 LAB — PROTIME-INR: INR: 2.1 — ABNORMAL HIGH (ref 0.00–1.49)

## 2012-09-13 MED ORDER — FLECAINIDE ACETATE 100 MG PO TABS
100.0000 mg | ORAL_TABLET | Freq: Once | ORAL | Status: AC
  Administered 2012-09-13: 100 mg via ORAL
  Filled 2012-09-13: qty 1

## 2012-09-13 MED ORDER — BISOPROLOL FUMARATE 10 MG PO TABS
10.0000 mg | ORAL_TABLET | Freq: Once | ORAL | Status: AC
  Administered 2012-09-13: 10 mg via ORAL
  Filled 2012-09-13: qty 1

## 2012-09-13 MED ORDER — DEXTROSE 5 % IV SOLN
5.0000 mg/h | Freq: Once | INTRAVENOUS | Status: AC
  Administered 2012-09-13: 5 mg/h via INTRAVENOUS

## 2012-09-13 MED ORDER — POTASSIUM CHLORIDE 20 MEQ/15ML (10%) PO LIQD
40.0000 meq | Freq: Once | ORAL | Status: AC
  Administered 2012-09-13: 40 meq via ORAL
  Filled 2012-09-13: qty 30

## 2012-09-13 MED ORDER — SODIUM CHLORIDE 0.9 % IV BOLUS (SEPSIS)
1000.0000 mL | Freq: Once | INTRAVENOUS | Status: AC
  Administered 2012-09-13: 1000 mL via INTRAVENOUS

## 2012-09-13 MED ORDER — MORPHINE SULFATE 4 MG/ML IJ SOLN
4.0000 mg | Freq: Once | INTRAMUSCULAR | Status: AC
  Administered 2012-09-13: 4 mg via INTRAVENOUS
  Filled 2012-09-13: qty 1

## 2012-09-13 NOTE — ED Notes (Signed)
Discharged on weds post ablation for atrial flutter, tonight began to have chest discomfort and feeling heart racing. HR 122, alert and oriented. HX COPD, atrial flutter.

## 2012-09-13 NOTE — ED Provider Notes (Addendum)
History     CSN: [id number]  Arrival date & time 09/13/12  0114   First MD Initiated Contact with Patient 09/13/12 [phone]      Chief Complaint  Patient presents with  . Chest Pain    (Consider location/radiation/quality/duration/timing/severity/associated sxs/prior treatment) HPI Comments: Patient with history of paroxysmal aflutter.  Had an ablation performed two days ago and was discharged yesterday.  He presents tonight with a recurrence of the aflutter.  He denies to me he is having any chest pain.    Patient is a 57 y.o. male presenting with palpitations. The history is provided by the patient.  Palpitations  This is a new problem. The current episode started 1 to 2 hours ago. The problem occurs constantly. The problem has not changed since onset.The problem is associated with an unknown factor. On average, each episode lasts 2 hours. Associated symptoms include diaphoresis. Pertinent negatives include no fever, no chest pain, no chest pressure and no shortness of breath.    Past Medical History  Diagnosis Date  . HLD (hyperlipidemia)     takes Pravastatin daily  . Obesity   . Brain aneurysm 2009    questionable. A follow up CTA in 2009 showed no evidence of  . Tobacco abuse   . Overdose 2009    unintentional Flacanide overdose  . HTN (hypertension)     takes Prinizide daily  . Dyspnea     LHC 11/08: EF 60%, normal coronary arteries;  ETT-echo 2/11: normal at 71% PMHR  . Shortness of breath     lying/sitting/exertion  . Seasonal allergies     was on Claritin-stopped taking this pt states it wasn't helping  . Coughing   . History of bronchitis 22yrs ago  . COPD (chronic obstructive pulmonary disease)   . Emphysema   . Atrial flutter     echo 8/11: mild LVH, EF 60-65%, trivial MR, mod LAE, mild RAE, PASP 43-47, mild pulmo HTN;takes Flecanide and Propranalol daily  . Peripheral edema     takes Furosemide as needed  . Hypokalemia     hx of;pt states he was taking K+ but  now eats a banana daily instead  . Dizziness   . Short-term memory loss   . Peripheral neuropathy   . Arthritis     lower back  . Chronic back pain     DDD/stenosis  . Bruises easily     takes Coumadin daily  . GERD (gastroesophageal reflux disease)     takes Omeprazole bid  . Colon polyps     9 polyps removed 10/13/11  . Urinary frequency   . Urinary urgency   . Diabetes mellitus     takes Metformin and Glimepiride daily  . History of shingles   . OSA (obstructive sleep apnea)     noncompliant with CPAP  . Pneumonia     Past Surgical History  Procedure Laterality Date  . Neck surgery  59yrs ago  . Right shoulder surgery  4-8yrs ago    cyst removed  . Tonsilectomy, adenoidectomy, bilateral myringotomy and tubes      as a cild  . Left inguinal hernia repair      as a child  . Tee without cardioversion  05/05/2011    Procedure: TRANSESOPHAGEAL ECHOCARDIOGRAM (TEE);  Surgeon: Lewayne Bunting, MD;  Location: [facility] ENDOSCOPY;  Service: Cardiovascular;  Laterality: N/A;  . Cardioversion  05/05/2011    Procedure: CARDIOVERSION;  Surgeon: Lewayne Bunting, MD;  Location: [facility] ENDOSCOPY;  Service: Cardiovascular;  Laterality: N/A;  . Throat surgery  4-23yrs ago    "thought " it was cancer but came back not  . Carpal tunnel release  99/2000    bilateral  . Appendectomy  2-83yrs ago  . Knee surgery  6-86yrs ago    left  . Cardiac catheterization  2008    no significant CAD  . Tonsillectomy    . Hernia repair    . Anterior cervical decomp/discectomy fusion  01/04/2012    Procedure: ANTERIOR CERVICAL DECOMPRESSION/DISCECTOMY FUSION 1 LEVEL/HARDWARE REMOVAL;  Surgeon: Cristi Loron, MD;  Location: [facility] NEURO ORS;  Service: Neurosurgery;  Laterality: N/A;  explore cervical fusion Cervical six - seven  with removal of codman plate anterior cervical decompression with fusion interbody prothesis plating and bonegraft  . Cardioversion Bilateral 07/26/2012    Procedure: CARDIOVERSION;   Surgeon: Rollene Rotunda, MD;  Location: [facility] ENDOSCOPY;  Service: Cardiovascular;  Laterality: Bilateral;  . Ablation of dysrhythmic focus  09/11/2012    Family History  Problem Relation Age of Onset  . Colon cancer Neg Hx   . Anesthesia problems Neg Hx   . Hypotension Neg Hx   . Malignant hyperthermia Neg Hx   . Pseudochol deficiency Neg Hx     History  Substance Use Topics  . Smoking status: Current Every Day Smoker -- 0.30 packs/day for 40 years    Types: Cigarettes  . Smokeless tobacco: Never Used     Comment: cessation advised,  he is not ready to quit  . Alcohol Use: 0.0 oz/week     Comment: couple of times a month liquor, he tells me he typically drinks 2-4 drinks most days but hasnt drank anything in 6 weeks      Review of Systems  Constitutional: Positive for diaphoresis. Negative for fever.  Respiratory: Negative for shortness of breath.   Cardiovascular: Positive for palpitations. Negative for chest pain.  All other systems reviewed and are negative.    Allergies  Latex  Home Medications   Current Outpatient Rx  Name  Route  Sig  Dispense  Refill  . albuterol (ACCUNEB) 1.25 MG/3ML nebulizer solution   Nebulization   Take 1 ampule by nebulization daily as needed for shortness of breath.          . bisoprolol (ZEBETA) 10 MG tablet   Oral   Take 10 mg by mouth daily.         . flecainide (TAMBOCOR) 100 MG tablet   Oral   Take 1 tablet (100 mg total) by mouth 2 (two) times daily.   60 tablet   5   . furosemide (LASIX) 40 MG tablet   Oral   Take 40 mg by mouth daily as needed (swelling). For swelling         . glimepiride (AMARYL) 1 MG tablet   Oral   Take 1 mg by mouth daily before breakfast.          . hydrochlorothiazide (HYDRODIURIL) 25 MG tablet   Oral   Take 25 mg by mouth daily.         [name] HYDROcodone-acetaminophen (NORCO) 10-325 MG per tablet   Oral   Take 1 tablet by mouth every 4 (four) hours as needed for pain.          .  metFORMIN (GLUCOPHAGE) 500 MG tablet   Oral   Take 500 mg by mouth 2 (two) times daily with a meal.          .  Multiple Vitamin (MULTIVITAMIN WITH MINERALS) TABS   Oral   Take 1 tablet by mouth daily.         [name] omeprazole (PRILOSEC) 20 MG capsule   Oral   Take 20 mg by mouth 2 (two) times daily.          . potassium chloride (KLOR-CON 10) 10 MEQ tablet   Oral   Take 10 mEq by mouth daily as needed (when take lasix dose).          . pravastatin (PRAVACHOL) 80 MG tablet   Oral   Take 1 tablet (80 mg total) by mouth at bedtime.   30 tablet   6   . warfarin (COUMADIN) 5 MG tablet   Oral   Take 5-7.5 mg by mouth daily. Take 1.5 tablets (7.5mg ) daily except every Sunday takes 5mg            BP 120/77  Pulse 110  Temp(Src) 98.2 F (36.8 C) (Oral)  Resp 20  SpO2 98%  Physical Exam  Nursing note and vitals reviewed. Constitutional: He is oriented to person, place, and time. He appears well-developed and well-nourished. No distress.  HENT:  Head: Normocephalic and atraumatic.  Mouth/Throat: Oropharynx is clear and moist.  Neck: Normal range of motion. Neck supple.  Cardiovascular:  No murmur heard. Irregularly irregular.  Pulmonary/Chest: Effort normal and breath sounds normal.  Abdominal: Bowel sounds are normal.  Somewhat protuberant abdomen with apparent ascites.  Musculoskeletal: Normal range of motion. He exhibits no edema.  Neurological: He is alert and oriented to person, place, and time. No cranial nerve deficit. He exhibits normal muscle tone. Coordination normal.  Skin: Skin is warm and dry. He is not diaphoretic.    ED Course  Procedures (including critical care time)  Labs Reviewed  CBC - Abnormal; Notable for the following:    Platelets 131 (*)    All other components within normal limits  BASIC METABOLIC PANEL - Abnormal; Notable for the following:    Potassium 3.3 (*)    Glucose, Bld 233 (*)    All other components within normal limits   PROTIME-INR - Abnormal; Notable for the following:    Prothrombin Time 22.7 (*)    INR 2.10 (*)    All other components within normal limits  POCT I-STAT TROPONIN I - Abnormal; Notable for the following:    Troponin i, poc 0.15 (*)    All other components within normal limits   Dg Chest Port 1 View  09/13/2012  *RADIOLOGY REPORT*  Clinical Data: Chest pain and shortness of breath.  PORTABLE CHEST - 1 VIEW  Comparison: 11/21/2011  Findings: Mild prominence of heart size and pulmonary vascularity, likely normal for technique.  No focal consolidation or airspace disease.  No blunting of costophrenic angles.  No pneumothorax. Mediastinal contours appear intact.  No significant change since previous study.  IMPRESSION: No evidence of active pulmonary disease.   Original Report Authenticated By: Burman Nieves, M.D.      No diagnosis found.   Date: 09/13/2012  Rate: 114  Rhythm: atrial fibrillation  QRS Axis: normal  Intervals: normal  ST/T Wave abnormalities: normal  Conduction Disutrbances:none  Narrative Interpretation:   Old EKG Reviewed: unchanged   Date: 09/13/2012  Rate: 63  Rhythm: normal sinus rhythm  QRS Axis: normal  Intervals: normal  ST/T Wave abnormalities: normal  Conduction Disutrbances:none  Narrative Interpretation:   Old EKG Reviewed: changes noted    MDM  The workup reveals recurrent afib /  flutter and troponin is positive.  I suspect this is related to the recent ablation.  He was started on a cardizem drip and Cardiology has been consulted.  Dr. Adolm Joseph will see the patient in the ED.  Dr. Adolm Joseph has recommended that we attempt to convert him in the ED.  He was given meds and shortly thereafter converted to a sinus rhythm.  EKG repeated confirming this and repeat troponin does not show any increase.  Will discharge to home.   CRITICAL CARE Performed by: Geoffery Lyons   Total critical care time: 30 minutes  Critical care time was exclusive of  separately billable procedures and treating other patients.  Critical care was necessary to treat or prevent imminent or life-threatening deterioration.  Critical care was time spent personally by me on the following activities: development of treatment plan with patient and/or surrogate as well as nursing, discussions with consultants, evaluation of patient's response to treatment, examination of patient, obtaining history from patient or surrogate, ordering and performing treatments and interventions, ordering and review of laboratory studies, ordering and review of radiographic studies, pulse oximetry and re-evaluation of patient's condition.      Geoffery Lyons, MD 09/13/12 1610  Geoffery Lyons, MD 09/13/12 [id number]  Geoffery Lyons, MD 09/13/12 [phone]

## 2012-09-13 NOTE — Consult Note (Signed)
CARDIOLOGY CONSULT NOTE  Patient ID: [name], MRN: [id number], DOB/AGE: [birth year] 55 y.o. Admit date: 09/13/2012 Date of Consult: 09/13/2012  Primary Physician: Aida Puffer, MD Primary Cardiologist: Dr. Johney Frame  Chief Complaint: palpitations Reason for Consultation: afib s/p recent flutter ablation, now in fib  HPI: 57 y.o. male w/ PMHx significant for HTN, tobacco abuse, COPD, DM2, aflutter s/p ablation 2 days ago who presented to [facility] on 09/13/2012 with complaints of palpitations. Reports that during his hospitalization, he had brief bouts of afib but resolved after a few minutes. This evening, he had sustained palpitations and sought emergency care. Taking all meds as instructed at discharge.  In the ER, was found to be in afib with RVR. Started on diltiazem gtt. Noted troponin to be 0.16 - ?related to recent ablation.  Therapeutic on his coumadin.  Currently he feels fine. Anxious about upcoming court appointment.  Past Medical History  Diagnosis Date  . HLD (hyperlipidemia)     takes Pravastatin daily  . Obesity   . Brain aneurysm 2009    questionable. A follow up CTA in 2009 showed no evidence of  . Tobacco abuse   . Overdose 2009    unintentional Flacanide overdose  . HTN (hypertension)     takes Prinizide daily  . Dyspnea     LHC 11/08: EF 60%, normal coronary arteries;  ETT-echo 2/11: normal at 71% PMHR  . Shortness of breath     lying/sitting/exertion  . Seasonal allergies     was on Claritin-stopped taking this pt states it wasn't helping  . Coughing   . History of bronchitis 36yrs ago  . COPD (chronic obstructive pulmonary disease)   . Emphysema   . Atrial flutter     echo 8/11: mild LVH, EF 60-65%, trivial MR, mod LAE, mild RAE, PASP 43-47, mild pulmo HTN;takes Flecanide and Propranalol daily  . Peripheral edema     takes Furosemide as needed  . Hypokalemia     hx of;pt states he was taking K+ but now eats a banana daily instead  .  Dizziness   . Short-term memory loss   . Peripheral neuropathy   . Arthritis     lower back  . Chronic back pain     DDD/stenosis  . Bruises easily     takes Coumadin daily  . GERD (gastroesophageal reflux disease)     takes Omeprazole bid  . Colon polyps     9 polyps removed 10/13/11  . Urinary frequency   . Urinary urgency   . Diabetes mellitus     takes Metformin and Glimepiride daily  . History of shingles   . OSA (obstructive sleep apnea)     noncompliant with CPAP  . Pneumonia       Surgical History:  Past Surgical History  Procedure Laterality Date  . Neck surgery  56yrs ago  . Right shoulder surgery  4-81yrs ago    cyst removed  . Tonsilectomy, adenoidectomy, bilateral myringotomy and tubes      as a cild  . Left inguinal hernia repair      as a child  . Tee without cardioversion  05/05/2011    Procedure: TRANSESOPHAGEAL ECHOCARDIOGRAM (TEE);  Surgeon: Lewayne Bunting, MD;  Location: [facility] ENDOSCOPY;  Service: Cardiovascular;  Laterality: N/A;  . Cardioversion  05/05/2011    Procedure: CARDIOVERSION;  Surgeon: Lewayne Bunting, MD;  Location: [facility] ENDOSCOPY;  Service: Cardiovascular;  Laterality: N/A;  . Throat surgery  4-42yrs ago    "  thought " it was cancer but came back not  . Carpal tunnel release  99/2000    bilateral  . Appendectomy  2-56yrs ago  . Knee surgery  6-49yrs ago    left  . Cardiac catheterization  2008    no significant CAD  . Tonsillectomy    . Hernia repair    . Anterior cervical decomp/discectomy fusion  01/04/2012    Procedure: ANTERIOR CERVICAL DECOMPRESSION/DISCECTOMY FUSION 1 LEVEL/HARDWARE REMOVAL;  Surgeon: Cristi Loron, MD;  Location: [facility] NEURO ORS;  Service: Neurosurgery;  Laterality: N/A;  explore cervical fusion Cervical six - seven  with removal of codman plate anterior cervical decompression with fusion interbody prothesis plating and bonegraft  . Cardioversion Bilateral 07/26/2012    Procedure: CARDIOVERSION;  Surgeon: Rollene Rotunda, MD;  Location: [facility] ENDOSCOPY;  Service: Cardiovascular;  Laterality: Bilateral;  . Ablation of dysrhythmic focus  09/11/2012     Home Meds: Prior to Admission medications   Medication Sig Start Date End Date Taking? Authorizing Provider  albuterol (ACCUNEB) 1.25 MG/3ML nebulizer solution Take 1 ampule by nebulization daily as needed for shortness of breath.    Yes Historical Provider, MD  bisoprolol (ZEBETA) 10 MG tablet Take 10 mg by mouth daily.   Yes Historical Provider, MD  flecainide (TAMBOCOR) 100 MG tablet Take 1 tablet (100 mg total) by mouth 2 (two) times daily. 08/21/12  Yes Rollene Rotunda, MD  furosemide (LASIX) 40 MG tablet Take 40 mg by mouth daily as needed (swelling). For swelling   Yes Historical Provider, MD  glimepiride (AMARYL) 1 MG tablet Take 1 mg by mouth daily before breakfast.    Yes Historical Provider, MD  hydrochlorothiazide (HYDRODIURIL) 25 MG tablet Take 25 mg by mouth daily.   Yes Historical Provider, MD  HYDROcodone-acetaminophen (NORCO) 10-325 MG per tablet Take 1 tablet by mouth every 4 (four) hours as needed for pain.    Yes Historical Provider, MD  metFORMIN (GLUCOPHAGE) 500 MG tablet Take 500 mg by mouth 2 (two) times daily with a meal.    Yes Historical Provider, MD  Multiple Vitamin (MULTIVITAMIN WITH MINERALS) TABS Take 1 tablet by mouth daily.   Yes Historical Provider, MD  omeprazole (PRILOSEC) 20 MG capsule Take 20 mg by mouth 2 (two) times daily.    Yes Historical Provider, MD  potassium chloride (KLOR-CON 10) 10 MEQ tablet Take 10 mEq by mouth daily as needed (when take lasix dose).    Yes Historical Provider, MD  pravastatin (PRAVACHOL) 80 MG tablet Take 1 tablet (80 mg total) by mouth at bedtime. 07/18/12  Yes Rollene Rotunda, MD  warfarin (COUMADIN) 5 MG tablet Take 5-7.5 mg by mouth daily. Take 1.5 tablets (7.5mg ) daily except every Sunday takes 5mg    Yes Historical Provider, MD    Allergies:  Allergies  Allergen Reactions  . Latex Itching     History   Social History  . Marital Status: Married    Spouse Name: N/A    Number of Children: 2  . Years of Education: N/A   Occupational History  . Mechanic     Social History Main Topics  . Smoking status: Current Every Day Smoker -- 0.30 packs/day for 40 years    Types: Cigarettes  . Smokeless tobacco: Never Used     Comment: cessation advised,  he is not ready to quit  . Alcohol Use: 0.0 oz/week     Comment: couple of times a month liquor, he tells me he typically drinks 2-4 drinks  most days but hasnt drank anything in 6 weeks  . Drug Use: No  . Sexually Active: Yes   Other Topics Concern  . Not on file   Social History Narrative   Daily caffeine(Mountain Dew)       Lives in [place] with spouse.   Unemployed due to chronic back/ leg pain           Family History  Problem Relation Age of Onset  . Colon cancer Neg Hx   . Anesthesia problems Neg Hx   . Hypotension Neg Hx   . Malignant hyperthermia Neg Hx   . Pseudochol deficiency Neg Hx      Review of Systems: General: negative for chills, fever, night sweats or weight changes.  Cardiovascular: negative for chest pain, shortness of breath, dyspnea on exertion, edema, orthopnea,  or paroxysmal nocturnal dyspnea Dermatological: negative for rash Respiratory: negative for cough or wheezing Urologic: negative for hematuria Abdominal: negative for nausea, vomiting, diarrhea, bright red blood per rectum, melena, or hematemesis Neurologic: negative for visual changes, syncope,  All other systems reviewed and are otherwise negative except as noted above.  Labs: No results found for this basename: CKTOTAL, CKMB, TROPONINI,  in the last 72 hours Lab Results  Component Value Date   WBC 6.3 09/13/2012   HGB 15.0 09/13/2012   HCT 42.5 09/13/2012   MCV 88.2 09/13/2012   PLT 131* 09/13/2012    Recent Labs Lab 09/13/12 0131  NA 142  K 3.3*  CL 99  CO2 30  BUN 12  CREATININE 0.62  CALCIUM 10.1  GLUCOSE  233*   Lab Results  Component Value Date   CHOL 180 09/08/2011   HDL 33.80* 09/08/2011   LDLCALC  Value: 85        Total Cholesterol/HDL:CHD Risk Coronary Heart Disease Risk Table                     Men   Women  1/2 Average Risk   3.4   3.3 02/13/2008   TRIG 294.0* 09/08/2011   No results found for this basename: DDIMER    Radiology/Studies:  Dg Chest Port 1 View  09/13/2012  *RADIOLOGY REPORT*  Clinical Data: Chest pain and shortness of breath.  PORTABLE CHEST - 1 VIEW  Comparison: 11/21/2011  Findings: Mild prominence of heart size and pulmonary vascularity, likely normal for technique.  No focal consolidation or airspace disease.  No blunting of costophrenic angles.  No pneumothorax. Mediastinal contours appear intact.  No significant change since previous study.  IMPRESSION: No evidence of active pulmonary disease.   Original Report Authenticated By: Burman Nieves, M.D.     EKG: atrial fibrillation with rate 130, no st or tw changes   Physical Exam: Blood pressure 111/88, pulse 69, temperature 98.2 F (36.8 C), temperature source Oral, resp. rate 14, SpO2 98.00%. General: Well developed, well nourished, in no acute distress. Head: Normocephalic, atraumatic, sclera non-icteric, no xanthomas, nares are without discharge.  Neck: Supple. Negative for carotid bruits. JVD not elevated. Lungs: bilateral diffuse exp wheezing Heart: irreg, irreg Abdomen: Soft, non-tender, non-distended with normoactive bowel sounds. No hepatomegaly. No rebound/guarding. No obvious abdominal masses. Msk:  Strength and tone appear normal for age. Extremities: No clubbing or cyanosis. Trce edema.  Distal pedal pulses are 2+ and equal bilaterally. Neuro: Alert and oriented X 3. Moves all extremities spontaneously. Psych:  Responds to questions appropriately with a normal affect.   Problem List 1. Afib with RVR 2.  S/p recent flutter ablation, on fleicanide and coumadin 3. Tobacco abuse 4. COPD, wheezing  present 5. Hypokalemia 6. Diabetes 7. Elevated troponin in the setting of recent ablation  Assessment and Plan:   57 y.o. male w/ PMHx significant for HTN, tobacco abuse, COPD, DM2, aflutter s/p ablation 2 days ago who presented to [facility] on 09/13/2012 with complaints of palpitations --> found to be in afib with RVR.  Paroxysmal afib is common in the post ablation period. No red flags with his RVR (no chest pain, syncope, pre-syncope, CHF).  Recommendations of correcting K+, and giving additional beta blocker and intermediate dose of flecainide was successful in returning Mr. Santoli to sinus. Elevated troponin is most likely secondary to recent ablation. Reasonable to repeat and if the same or reduced, okay to discharge home.  Strongly recommended pt discontinue smoking as lung disease is a potent trigger for his afib.  Okay to discharge to home pending troponin. Will let Dr. Johney Frame know of ER visit - follow up regarding medication regimen (consider increasing flecainide etc.)    Signed, Lilyonna Steidle C. MD 09/13/2012, 5:46 AM

## 2012-09-13 NOTE — ED Notes (Signed)
DR. Adolm Joseph ( CARDIOLOGIST) AT BEDSIDE EXPLAINING PLAN OF CARE AND DISCHARGE INSTRUCTIONS GIVEN .

## 2012-09-13 NOTE — Telephone Encounter (Signed)
New problem   Pt was seen in ER today and was told by ER physician that Dr Jenel Lucks nurse would contact him. Please call pt.

## 2012-09-13 NOTE — Telephone Encounter (Signed)
Patient was in the ER and was in afib and went to the ER,  They gave him an extra dose of Flecainide and he converted.  I have let him know that we can draw a Flecainide level and see what his levels are and if low can increase his dose. He will call back if he has further problems

## 2012-09-13 NOTE — ED Notes (Signed)
MD at bedside. 

## 2012-09-18 ENCOUNTER — Ambulatory Visit (INDEPENDENT_AMBULATORY_CARE_PROVIDER_SITE_OTHER): Payer: BC Managed Care – PPO | Admitting: *Deleted

## 2012-09-18 DIAGNOSIS — Z7901 Long term (current) use of anticoagulants: Secondary | ICD-10-CM

## 2012-09-18 DIAGNOSIS — I4891 Unspecified atrial fibrillation: Secondary | ICD-10-CM

## 2012-09-25 ENCOUNTER — Ambulatory Visit (INDEPENDENT_AMBULATORY_CARE_PROVIDER_SITE_OTHER): Payer: BC Managed Care – PPO | Admitting: Pulmonary Disease

## 2012-09-25 DIAGNOSIS — J4489 Other specified chronic obstructive pulmonary disease: Secondary | ICD-10-CM

## 2012-09-25 DIAGNOSIS — J449 Chronic obstructive pulmonary disease, unspecified: Secondary | ICD-10-CM

## 2012-09-25 NOTE — Progress Notes (Signed)
Spirometry before and after done today. 

## 2012-10-02 ENCOUNTER — Ambulatory Visit (INDEPENDENT_AMBULATORY_CARE_PROVIDER_SITE_OTHER): Payer: BC Managed Care – PPO

## 2012-10-02 DIAGNOSIS — Z7901 Long term (current) use of anticoagulants: Secondary | ICD-10-CM

## 2012-10-02 DIAGNOSIS — I4891 Unspecified atrial fibrillation: Secondary | ICD-10-CM

## 2012-10-05 ENCOUNTER — Encounter: Payer: Self-pay | Admitting: Gastroenterology

## 2012-10-17 ENCOUNTER — Ambulatory Visit (INDEPENDENT_AMBULATORY_CARE_PROVIDER_SITE_OTHER): Payer: BC Managed Care – PPO | Admitting: Internal Medicine

## 2012-10-17 ENCOUNTER — Encounter: Payer: Self-pay | Admitting: Internal Medicine

## 2012-10-17 VITALS — BP 135/71 | HR 70 | Ht 75.0 in | Wt 260.2 lb

## 2012-10-17 DIAGNOSIS — I10 Essential (primary) hypertension: Secondary | ICD-10-CM

## 2012-10-17 DIAGNOSIS — R55 Syncope and collapse: Secondary | ICD-10-CM

## 2012-10-17 DIAGNOSIS — I4891 Unspecified atrial fibrillation: Secondary | ICD-10-CM

## 2012-10-17 DIAGNOSIS — I4892 Unspecified atrial flutter: Secondary | ICD-10-CM

## 2012-10-17 DIAGNOSIS — G4733 Obstructive sleep apnea (adult) (pediatric): Secondary | ICD-10-CM

## 2012-10-17 DIAGNOSIS — Z720 Tobacco use: Secondary | ICD-10-CM

## 2012-10-17 DIAGNOSIS — F17200 Nicotine dependence, unspecified, uncomplicated: Secondary | ICD-10-CM

## 2012-10-17 LAB — CBC WITH DIFFERENTIAL/PLATELET
Basophils Absolute: 0 10*3/uL (ref 0.0–0.1)
Eosinophils Absolute: 0.1 10*3/uL (ref 0.0–0.7)
HCT: 43.6 % (ref 39.0–52.0)
Hemoglobin: 14.6 g/dL (ref 13.0–17.0)
Lymphocytes Relative: 18.8 % (ref 12.0–46.0)
Lymphs Abs: 1.4 10*3/uL (ref 0.7–4.0)
MCHC: 33.4 g/dL (ref 30.0–36.0)
MCV: 92.2 fl (ref 78.0–100.0)
Monocytes Absolute: 0.6 10*3/uL (ref 0.1–1.0)
Neutro Abs: 5.4 10*3/uL (ref 1.4–7.7)
RDW: 15.3 % — ABNORMAL HIGH (ref 11.5–14.6)

## 2012-10-17 LAB — BASIC METABOLIC PANEL
CO2: 28 mEq/L (ref 19–32)
Calcium: 9.6 mg/dL (ref 8.4–10.5)
Glucose, Bld: 238 mg/dL — ABNORMAL HIGH (ref 70–99)
Sodium: 142 mEq/L (ref 135–145)

## 2012-10-17 MED ORDER — HYDROCHLOROTHIAZIDE 25 MG PO TABS: 12.5000 mg | ORAL_TABLET | Freq: Every day | ORAL | Status: DC

## 2012-10-17 NOTE — Progress Notes (Signed)
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 male who presents today for routine electrophysiology followup.  Since his atrial flutter ablation, the patient reports doing well.  He returned to the ER with documented afib.  He continues to have occasional afib, lasting mostly < 10 minutes.  Today, he denies symptoms of  chest pain, shortness of breath, or  lower extremity edema.  He reports postural dizziness frequently.  He had one episode of postural syncope several weeks ago.  He continues to smoke.   The patient is otherwise without complaint today.   Past Medical History  Diagnosis Date  . HLD (hyperlipidemia)     takes Pravastatin daily  . Obesity   . Brain aneurysm 2009    questionable. A follow up CTA in 2009 showed no evidence of  . Tobacco abuse   . Overdose 2009    unintentional Flacanide overdose  . HTN (hypertension)     takes Prinizide daily  . Dyspnea     LHC 11/08: EF 60%, normal coronary arteries;  ETT-echo 2/11: normal at 71% PMHR  . Shortness of breath     lying/sitting/exertion  . Seasonal allergies     was on Claritin-stopped taking this pt states it wasn't helping  . Coughing   . History of bronchitis 94yrs ago  . COPD (chronic obstructive pulmonary disease)   . Emphysema   . Atrial flutter     s/p CTI ablation by Dr Johney Frame  . Peripheral edema     takes Furosemide as needed  . Hypokalemia     hx of;pt states he was taking K+ but now eats a banana daily instead  . Dizziness   . Short-term memory loss   . Peripheral neuropathy   . Arthritis     lower back  . Chronic back pain     DDD/stenosis  . Bruises easily     takes Coumadin daily  . GERD (gastroesophageal reflux disease)     takes Omeprazole bid  . Colon polyps     9 polyps removed 10/13/11  . Urinary frequency   . Urinary urgency   . Diabetes mellitus     takes Metformin and Glimepiride daily  . History of shingles   . OSA (obstructive sleep apnea)      noncompliant with CPAP  . Pneumonia   . Paroxysmal atrial fibrillation    Past Surgical History  Procedure Laterality Date  . Neck surgery  67yrs ago  . Right shoulder surgery  4-74yrs ago    cyst removed  . Tonsilectomy, adenoidectomy, bilateral myringotomy and tubes      as a cild  . Left inguinal hernia repair      as a child  . Tee without cardioversion  05/05/2011    Procedure: TRANSESOPHAGEAL ECHOCARDIOGRAM (TEE);  Surgeon: Lewayne Bunting, MD;  Location: [facility] ENDOSCOPY;  Service: Cardiovascular;  Laterality: N/A;  . Cardioversion  05/05/2011    Procedure: CARDIOVERSION;  Surgeon: Lewayne Bunting, MD;  Location: [facility] ENDOSCOPY;  Service: Cardiovascular;  Laterality: N/A;  . Throat surgery  4-65yrs ago    "thought " it was cancer but came back not  . Carpal tunnel release  99/2000    bilateral  . Appendectomy  2-82yrs ago  . Knee surgery  6-31yrs ago    left  . Cardiac catheterization  2008    no significant CAD  . Tonsillectomy    . Hernia repair    .  Anterior cervical decomp/discectomy fusion  01/04/2012    Procedure: ANTERIOR CERVICAL DECOMPRESSION/DISCECTOMY FUSION 1 LEVEL/HARDWARE REMOVAL;  Surgeon: Cristi Loron, MD;  Location: [facility]UR[facility]S;  Service: Neurosurgery;  Laterality: N/A;  explore cervical fusion Cervical six - seven  with removal of codman plate anterior cervical decompression with fusion interbody prothesis plating and bonegraft  . Cardioversion Bilateral 07/26/2012    Procedure: CARDIOVERSION;  Surgeon: Rollene Rotunda, MD;  Location: [facility] ENDOSCOPY;  Service: Cardiovascular;  Laterality: Bilateral;  . Atrial flutter ablation  09/11/2012    CTI ablation by Dr Johney Frame    Current Outpatient Prescriptions  Medication Sig Dispense Refill  . albuterol (ACCUNEB) 1.25 MG/3ML nebulizer solution Take 1 ampule by nebulization daily as needed for shortness of breath.       . bisoprolol (ZEBETA) 10 MG tablet Take 10 mg by mouth daily.      . flecainide (TAMBOCOR) 100  MG tablet Take 1 tablet (100 mg total) by mouth 2 (two) times daily.  60 tablet  5  . furosemide (LASIX) 40 MG tablet Take 40 mg by mouth daily as needed (swelling). For swelling      . glimepiride (AMARYL) 1 MG tablet Take 1 mg by mouth daily before breakfast.       . hydrochlorothiazide (HYDRODIURIL) 25 MG tablet Take 25 mg by mouth daily.      [name] HYDROcodone-acetaminophen (NORCO) 10-325 MG per tablet Take 1 tablet by mouth every 4 (four) hours as needed for pain.       . metFORMIN (GLUCOPHAGE) 500 MG tablet Take 500 mg by mouth 2 (two) times daily with a meal.       . Multiple Vitamin (MULTIVITAMIN WITH MINERALS) TABS Take 1 tablet by mouth daily.      [name] omeprazole (PRILOSEC) 20 MG capsule Take 20 mg by mouth 2 (two) times daily.       . potassium chloride (KLOR-CON 10) 10 MEQ tablet Take 10 mEq by mouth daily as needed (when take lasix dose).       . pravastatin (PRAVACHOL) 80 MG tablet Take 1 tablet (80 mg total) by mouth at bedtime.  30 tablet  6  . warfarin (COUMADIN) 5 MG tablet Take 5-7.5 mg by mouth daily. Take 1.5 tablets (7.5mg ) daily except every Sunday takes 5mg        No current facility-administered medications for this visit.    Physical Exam: Filed Vitals:   10/17/12 1212  BP: 135/71  Pulse: 70  Height: 6\' 3"  (1.905 m)  Weight: 260 lb 3.2 oz (118.026 kg)    GEN- The patient is overweight and chronically ill appearing, alert and oriented x 3 today.   Head- normocephalic, atraumatic Eyes-  Sclera clear, conjunctiva pink Ears- hearing intact Oropharynx- clear Lungs- prolonged expiratory wheezing,  Heart- Regular rate and rhythm, no murmurs, rubs or gallops, PMI not laterally displaced GI- soft, NT, ND, + BS Extremities- no clubbing, cyanosis, or edema  ekg today reveals sinus rhythm 70 bpm, otherwise normal ekg (Qtc 438)  Assessment and Plan:  1. Atrial flutter Doing well s/p ablation  2. afib Continue flecainide long term Consider tikosyn if he has increased  afib going forward Continue long term anticoagulation  3. OSA Compliance with CPAP is encouraged (he does not use) in order to adequately control arrhythmias  4. Postural syncope/ dizziness Decrease hctz to 12.5mg  daily Check bmet and cbc  Today If not improved then stop hctz I have instructed him to not drive given recent syncope.  5. Tobacco Cessation strongly advised He is not ready to quit  Follow up with Dr Antoine Poche I will see as needed going forward.

## 2012-10-17 NOTE — Patient Instructions (Signed)
Your physician recommends that you schedule a follow-up appointment in: 4 weeks with Hochrein, Dr Johney Frame as needed  Your physician has recommended you make the following change in your medication:  1) Decrease HCTZ to 12.5mg  daily   Your physician recommends that you return for lab work today:  BMP/CBC

## 2012-10-18 ENCOUNTER — Encounter: Payer: Self-pay | Admitting: Pulmonary Disease

## 2012-10-23 ENCOUNTER — Ambulatory Visit (INDEPENDENT_AMBULATORY_CARE_PROVIDER_SITE_OTHER): Payer: BC Managed Care – PPO

## 2012-10-23 DIAGNOSIS — Z7901 Long term (current) use of anticoagulants: Secondary | ICD-10-CM

## 2012-10-23 DIAGNOSIS — I4891 Unspecified atrial fibrillation: Secondary | ICD-10-CM

## 2012-10-26 ENCOUNTER — Telehealth: Payer: Self-pay | Admitting: Internal Medicine

## 2012-10-26 NOTE — Telephone Encounter (Signed)
**Note De-identified [name] Obfuscation** Pt advised, he verbalized understanding. 

## 2012-10-26 NOTE — Telephone Encounter (Signed)
New Problem  Pt is calling concerning his lab results. He asked if someone could call him back.

## 2012-10-31 ENCOUNTER — Encounter: Payer: Self-pay | Admitting: Cardiology

## 2012-10-31 ENCOUNTER — Ambulatory Visit (INDEPENDENT_AMBULATORY_CARE_PROVIDER_SITE_OTHER): Payer: BC Managed Care – PPO | Admitting: Cardiology

## 2012-10-31 ENCOUNTER — Ambulatory Visit (INDEPENDENT_AMBULATORY_CARE_PROVIDER_SITE_OTHER): Payer: BC Managed Care – PPO | Admitting: *Deleted

## 2012-10-31 VITALS — BP 114/68 | HR 70 | Ht 75.0 in | Wt 261.0 lb

## 2012-10-31 DIAGNOSIS — I4891 Unspecified atrial fibrillation: Secondary | ICD-10-CM

## 2012-10-31 DIAGNOSIS — Z7901 Long term (current) use of anticoagulants: Secondary | ICD-10-CM

## 2012-10-31 LAB — POCT INR: INR: 2.9

## 2012-10-31 NOTE — Progress Notes (Signed)
HPI The patient presents for followup of atrial flutter and atrial fibrillation.  He is status post flutter ablation. He still has some paroxysms of atrial fibrillation but he thinks these are more frequent. However, he's noticing that they only last for a few minutes at a time for the most part. He denies any presyncope or syncope.  Overall he thinks his better. He denies any chest pressure, neck or arm discomfort. He's not having any PND or orthopnea. He reports having 1 episode of slurred speech not long ago but was found to have significantly elevated blood sugars shortly thereafter. Since this has been treated he's had no further episodes. He says he is only smoking a few cigarettes a day and is not drinking alcohol at all. Of note because of some dizziness and a low blood pressure his HCTZ was reduced at his last visit. He still has some of this dizziness.   Allergies  Allergen Reactions  . Latex Itching    Current Outpatient Prescriptions  Medication Sig Dispense Refill  . albuterol (ACCUNEB) 1.25 MG/3ML nebulizer solution Take 1 ampule by nebulization daily as needed for shortness of breath.       . bisoprolol (ZEBETA) 10 MG tablet Take 10 mg by mouth daily.      . flecainide (TAMBOCOR) 100 MG tablet Take 1 tablet (100 mg total) by mouth 2 (two) times daily.  60 tablet  5  . furosemide (LASIX) 40 MG tablet Take 40 mg by mouth daily as needed (swelling). For swelling      . glimepiride (AMARYL) 1 MG tablet Take 1 mg by mouth daily before breakfast.       . hydrochlorothiazide (HYDRODIURIL) 25 MG tablet Take 0.5 tablets (12.5 mg total) by mouth daily.      [name] HYDROcodone-acetaminophen (NORCO) 10-325 MG per tablet Take 1 tablet by mouth every 4 (four) hours as needed for pain.       . Liraglutide (VICTOZA) 18 MG/3ML SOPN Inject into the skin. Started Monday--Dr Little      . metFORMIN (GLUCOPHAGE) 500 MG tablet Take 500 mg by mouth 2 (two) times daily with a meal.       . Multiple Vitamin  (MULTIVITAMIN WITH MINERALS) TABS Take 1 tablet by mouth daily.      [name] omeprazole (PRILOSEC) 20 MG capsule Take 20 mg by mouth 2 (two) times daily.       . potassium chloride (KLOR-CON 10) 10 MEQ tablet Take 10 mEq by mouth daily as needed (when take lasix dose).       . pravastatin (PRAVACHOL) 80 MG tablet Take 1 tablet (80 mg total) by mouth at bedtime.  30 tablet  6  . warfarin (COUMADIN) 5 MG tablet Take 5-7.5 mg by mouth daily. Take 1.5 tablets (7.5mg ) daily except every Sunday takes 5mg        No current facility-administered medications for this visit.    Past Medical History  Diagnosis Date  . HLD (hyperlipidemia)     takes Pravastatin daily  . Obesity   . Brain aneurysm 2009    questionable. A follow up CTA in 2009 showed no evidence of  . Tobacco abuse   . Overdose 2009    unintentional Flacanide overdose  . HTN (hypertension)     takes Prinizide daily  . Dyspnea     LHC 11/08: EF 60%, normal coronary arteries;  ETT-echo 2/11: normal at 71% PMHR  . Shortness of breath     lying/sitting/exertion  .  Seasonal allergies     was on Claritin-stopped taking this pt states it wasn't helping  . Coughing   . History of bronchitis 52yrs ago  . COPD (chronic obstructive pulmonary disease)   . Emphysema   . Atrial flutter     s/p CTI ablation by Dr Johney Frame  . Peripheral edema     takes Furosemide as needed  . Hypokalemia     hx of;pt states he was taking K+ but now eats a banana daily instead  . Dizziness   . Short-term memory loss   . Peripheral neuropathy   . Arthritis     lower back  . Chronic back pain     DDD/stenosis  . Bruises easily     takes Coumadin daily  . GERD (gastroesophageal reflux disease)     takes Omeprazole bid  . Colon polyps     9 polyps removed 10/13/11  . Urinary frequency   . Urinary urgency   . Diabetes mellitus     takes Metformin and Glimepiride daily  . History of shingles   . OSA (obstructive sleep apnea)     noncompliant with CPAP  .  Pneumonia   . Paroxysmal atrial fibrillation     Past Surgical History  Procedure Laterality Date  . Neck surgery  58yrs ago  . Right shoulder surgery  4-40yrs ago    cyst removed  . Tonsilectomy, adenoidectomy, bilateral myringotomy and tubes      as a cild  . Left inguinal hernia repair      as a child  . Tee without cardioversion  05/05/2011    Procedure: TRANSESOPHAGEAL ECHOCARDIOGRAM (TEE);  Surgeon: Lewayne Bunting, MD;  Location: [facility] ENDOSCOPY;  Service: Cardiovascular;  Laterality: N/A;  . Cardioversion  05/05/2011    Procedure: CARDIOVERSION;  Surgeon: Lewayne Bunting, MD;  Location: [facility] ENDOSCOPY;  Service: Cardiovascular;  Laterality: N/A;  . Throat surgery  4-32yrs ago    "thought " it was cancer but came back not  . Carpal tunnel release  99/2000    bilateral  . Appendectomy  2-30yrs ago  . Knee surgery  6-33yrs ago    left  . Cardiac catheterization  2008    no significant CAD  . Tonsillectomy    . Hernia repair    . Anterior cervical decomp/discectomy fusion  01/04/2012    Procedure: ANTERIOR CERVICAL DECOMPRESSION/DISCECTOMY FUSION 1 LEVEL/HARDWARE REMOVAL;  Surgeon: Cristi Loron, MD;  Location: [facility]UR[facility]S;  Service: Neurosurgery;  Laterality: N/A;  explore cervical fusion Cervical six - seven  with removal of codman plate anterior cervical decompression with fusion interbody prothesis plating and bonegraft  . Cardioversion Bilateral 07/26/2012    Procedure: CARDIOVERSION;  Surgeon: Rollene Rotunda, MD;  Location: [facility] ENDOSCOPY;  Service: Cardiovascular;  Laterality: Bilateral;  . Atrial flutter ablation  09/11/2012    CTI ablation by Dr Johney Frame    ROS:  Burning in feet.  Otherwise as stated in the HPI and negative for all other systems.  PHYSICAL EXAM BP 114/68  Pulse 70  Ht 6\' 3"  (1.905 m)  Wt 261 lb (118.389 kg)  BMI 32.62 kg/m2  SpO2 98% GENERAL:  Well appearing HEENT:  Pupils equal round and reactive, fundi not visualized, oral mucosa ,  edentulous NECK:  No jugular venous distention, waveform within normal limits, carotid upstroke brisk and symmetric, no bruits, no thyromegaly LYMPHATICS:  No cervical, inguinal adenopathy LUNGS:  Clear to auscultation bilaterally, diminished BS. BACK:  No CVA tenderness  CHEST:  Unremarkable HEART:  PMI not displaced or sustained,S1 and S2 within normal limits, no S3, no S4, no clicks, no rubs, no murmurs, regular ABD:  Flat, positive bowel sounds normal in frequency in pitch, no bruits, no rebound, no guarding, no midline pulsatile mass, no hepatomegaly, no splenomegaly EXT:  2 plus pulses throughout, no edema, no cyanosis no clubbing SKIN:  No rashes no nodules, ruddy complexion   EKG:  Atrial flutter variable ventricular rate.  No acute ST T wave changes. Axis WNL.  10/31/2012  ASSESSMENT AND PLAN  ATRIAL FLUTTER:  I think for the most part he is successfully managed. If he has increasing symptoms we will consider to do some therapy. We will continue with his current anticoagulation.  HTN:  Given dizziness and low BP I will stop the HCTZ.  TOBACCO ABUSE:  He says his only smoking a few cigarettes at a time. I have suggested complete abstinence.  ALCOHOL USE:  He reports that he is no longer drinking.

## 2012-10-31 NOTE — Patient Instructions (Addendum)
Please stop your HCTZ Continue all other medications as listed  Follow up in 4 months with Dr Antoine Poche.  You will receive a letter in the mail 2 months before you are due.  Please call us when you receive this letter to schedule your follow up appointment.

## 2012-11-05 ENCOUNTER — Ambulatory Visit: Payer: BC Managed Care – PPO | Admitting: Cardiology

## 2012-11-21 ENCOUNTER — Encounter: Payer: Self-pay | Admitting: Gastroenterology

## 2012-11-21 ENCOUNTER — Ambulatory Visit: Payer: BC Managed Care – PPO | Admitting: Gastroenterology

## 2012-11-21 ENCOUNTER — Ambulatory Visit (INDEPENDENT_AMBULATORY_CARE_PROVIDER_SITE_OTHER): Payer: BC Managed Care – PPO | Admitting: *Deleted

## 2012-11-21 DIAGNOSIS — Z7901 Long term (current) use of anticoagulants: Secondary | ICD-10-CM

## 2012-11-21 DIAGNOSIS — I4891 Unspecified atrial fibrillation: Secondary | ICD-10-CM

## 2012-11-21 LAB — POCT INR: INR: 2.7

## 2012-11-22 ENCOUNTER — Encounter (HOSPITAL_COMMUNITY): Payer: Self-pay | Admitting: Pharmacy Technician

## 2012-11-22 ENCOUNTER — Ambulatory Visit (INDEPENDENT_AMBULATORY_CARE_PROVIDER_SITE_OTHER): Payer: BC Managed Care – PPO | Admitting: Gastroenterology

## 2012-11-22 ENCOUNTER — Encounter: Payer: Self-pay | Admitting: Gastroenterology

## 2012-11-22 ENCOUNTER — Other Ambulatory Visit: Payer: Self-pay | Admitting: Gastroenterology

## 2012-11-22 ENCOUNTER — Telehealth: Payer: Self-pay | Admitting: Gastroenterology

## 2012-11-22 ENCOUNTER — Encounter (HOSPITAL_COMMUNITY): Payer: Self-pay | Admitting: *Deleted

## 2012-11-22 VITALS — BP 166/80 | HR 72 | Ht 75.0 in | Wt 263.0 lb

## 2012-11-22 DIAGNOSIS — Z86010 Personal history of colonic polyps: Secondary | ICD-10-CM

## 2012-11-22 MED ORDER — PEG-KCL-NACL-NASULF-NA ASC-C 100 G PO SOLR: 1.0000 | Freq: Once | ORAL | Status: DC

## 2012-11-22 NOTE — Patient Instructions (Addendum)
You have been scheduled for a colonoscopy with propofol. Please follow written instructions given to you at your visit today.  Please pick up your prep kit at the pharmacy within the next 1-3 days. If you use inhalers (even only as needed), please bring them with you on the day of your procedure. Your physician has requested that you go to www.startemmi.com and enter the access code given to you at your visit today. This web site gives a general overview about your procedure. However, you should still follow specific instructions given to you by our office regarding your preparation for the procedure.  WE WILL CONTACT YOU ABOUT HOLDING YOUR COUMADIN                                               We are excited to introduce MyChart, a new best-in-class service that provides you online access to important information in your electronic medical record. We want to make it easier for you to view your health information - all in one secure location - when and where you need it. We expect MyChart will enhance the quality of care and service we provide.  When you register for MyChart, you can:    View your test results.    Request appointments and receive appointment reminders via email.    Request medication renewals.    View your medical history, allergies, medications and immunizations.    Communicate with your physician's office through a password-protected site.    Conveniently print information such as your medication lists.  To find out if MyChart is right for you, please talk to a member of our clinical staff today. We will gladly answer your questions about this free health and wellness tool.  If you are age 30 or older and want a member of your family to have access to your record, you must provide written consent by completing a proxy form available at our office. Please speak to our clinical staff about guidelines regarding accounts for patients younger than age 23.  As you activate  your MyChart account and need any technical assistance, please call the MyChart technical support line at [phone]) 83-CHART [phone]) or email your question to [email or web address]. If you email your question(s), please include your name, a return phone number and the best time to reach you.  If you have non-urgent health-related questions, you can send a message to our office through MyChart at [email or web address]. If you have a medical emergency, call 911.  Thank you for using MyChart as your new health and wellness resource!   MyChart licensed from [employer],  [id number]. Patents Pending.

## 2012-11-22 NOTE — Telephone Encounter (Signed)
  11/22/2012    RE: [name] DOB: 09-20-1955 MRN: [id number]   Dear Dr. Antoine Poche,    We have scheduled the above patient for an endoscopic procedure. Our records show that he is on anticoagulation therapy.   Please advise as to how long the patient may come off his therapy of Coumadin prior to the procedure, which is scheduled for 12/13/2012.  Please fax back/ or route the completed form to [facility] [place] [phone].   Sincerely,  Makell Drohan CMA-AAMA

## 2012-11-22 NOTE — Progress Notes (Signed)
11/22/2012 [name] [id number] 03-Jan-1956   History of Present Illness:  57 y.o. male with  significant COPD, atrial fibrillation/flutter on coumadin, DM, HTN, and hyperlipidemia who underwent a colonoscopy by Dr. Christella Hartigan 09/2011 at which time he had 9 polyps removed.  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  Largest polyp was in the sigmoid colon and was marked with ink.  No new health issues since last year.  Chronic health problems seem fairly stable.  No GI complaints at this time.  Current Medications, Allergies, Past Medical History, Past Surgical History, Family History and Social History were reviewed in [employer] record.   Physical Exam: BP 166/80  Pulse 72  Ht 6\' 3"  (1.905 m)  Wt 263 lb (119.296 kg)  BMI 32.87 kg/m2  SpO2 98% General: Chronically ill-appearing male, appears older than stated age.  SOB, but in no NAD. Head: Normocephalic and atraumatic Eyes:  sclerae anicteric, conjunctiva pink  Ears: Normal auditory acuity Lungs: Inspiratory and expiratory wheezing heard B/L. Heart: Regular rate and rhythm Abdomen: Soft, non-tender and non-distended. No masses, no hepatomegaly. Normal bowel sounds. Rectal: Deferred.  Will be done at the time of colonoscopy. Musculoskeletal: Symmetrical with no gross deformities  Extremities: No edema  Neurological: Alert oriented x 4, grossly nonfocal Psychological:  Alert and cooperative. Normal mood and affect  Assessment and Recommendations: 57 y.o. male with  significant COPD, atrial fibrillation/flutter on coumadin, DM, HTN, and hyperlipidemia who underwent a colonoscopy by Dr. Christella Hartigan 09/2011 at which time he had 9 polyps removed.  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  Will schedule colonoscopy with MAC sedation at [facility].  The risks, benefits, and alternatives of the procedure were discussed with the  patient and he consents to proceed.  The risks benefits and alternatives to a temporary hold of anti-coagulants/anti-platelets for the procedure were discussed with the patient he consents to proceed. Obtain clearance from Dr. Antoine Poche regarding holding his coumadin.

## 2012-11-22 NOTE — Progress Notes (Signed)
i agree with the plan outlined in this note 

## 2012-11-26 NOTE — Telephone Encounter (Signed)
OK to hold warfarin.  No bridging needed.

## 2012-11-26 NOTE — Telephone Encounter (Signed)
Spoke to pt. Told him Dr. Lindaann Slough recommendations. Pt verbalized understanding

## 2012-12-13 ENCOUNTER — Encounter (HOSPITAL_COMMUNITY): Payer: Self-pay | Admitting: *Deleted

## 2012-12-13 ENCOUNTER — Encounter (HOSPITAL_COMMUNITY)
Admission: RE | Disposition: A | Discharge status: Home / Self Care | Payer: Self-pay | Source: Ambulatory Visit | Attending: Gastroenterology

## 2012-12-13 ENCOUNTER — Encounter (HOSPITAL_COMMUNITY): Payer: Self-pay | Admitting: Anesthesiology

## 2012-12-13 ENCOUNTER — Ambulatory Visit (HOSPITAL_COMMUNITY): Payer: BC Managed Care – PPO | Admitting: Anesthesiology

## 2012-12-13 ENCOUNTER — Ambulatory Visit (HOSPITAL_COMMUNITY)
Admission: RE | Admit: 2012-12-13 | Discharge: 2012-12-13 | Disposition: A | Discharge status: Home / Self Care | Payer: BC Managed Care – PPO | Source: Ambulatory Visit | Attending: Gastroenterology | Admitting: Gastroenterology

## 2012-12-13 DIAGNOSIS — Z860100 Personal history of colon polyps, unspecified: Secondary | ICD-10-CM | POA: Insufficient documentation

## 2012-12-13 DIAGNOSIS — I10 Essential (primary) hypertension: Secondary | ICD-10-CM | POA: Insufficient documentation

## 2012-12-13 DIAGNOSIS — K219 Gastro-esophageal reflux disease without esophagitis: Secondary | ICD-10-CM | POA: Insufficient documentation

## 2012-12-13 DIAGNOSIS — J449 Chronic obstructive pulmonary disease, unspecified: Secondary | ICD-10-CM | POA: Insufficient documentation

## 2012-12-13 DIAGNOSIS — J4489 Other specified chronic obstructive pulmonary disease: Secondary | ICD-10-CM | POA: Insufficient documentation

## 2012-12-13 DIAGNOSIS — G4730 Sleep apnea, unspecified: Secondary | ICD-10-CM | POA: Insufficient documentation

## 2012-12-13 DIAGNOSIS — E119 Type 2 diabetes mellitus without complications: Secondary | ICD-10-CM | POA: Insufficient documentation

## 2012-12-13 DIAGNOSIS — I739 Peripheral vascular disease, unspecified: Secondary | ICD-10-CM | POA: Insufficient documentation

## 2012-12-13 DIAGNOSIS — D126 Benign neoplasm of colon, unspecified: Secondary | ICD-10-CM | POA: Insufficient documentation

## 2012-12-13 DIAGNOSIS — I4891 Unspecified atrial fibrillation: Secondary | ICD-10-CM | POA: Insufficient documentation

## 2012-12-13 DIAGNOSIS — Z7901 Long term (current) use of anticoagulants: Secondary | ICD-10-CM | POA: Insufficient documentation

## 2012-12-13 DIAGNOSIS — E785 Hyperlipidemia, unspecified: Secondary | ICD-10-CM | POA: Insufficient documentation

## 2012-12-13 DIAGNOSIS — Z8701 Personal history of pneumonia (recurrent): Secondary | ICD-10-CM | POA: Insufficient documentation

## 2012-12-13 DIAGNOSIS — Z6832 Body mass index (BMI) 32.0-32.9, adult: Secondary | ICD-10-CM | POA: Insufficient documentation

## 2012-12-13 DIAGNOSIS — E669 Obesity, unspecified: Secondary | ICD-10-CM | POA: Insufficient documentation

## 2012-12-13 DIAGNOSIS — Z79899 Other long term (current) drug therapy: Secondary | ICD-10-CM | POA: Insufficient documentation

## 2012-12-13 HISTORY — DX: Adverse effect of unspecified anesthetic, initial encounter: T41.45XA

## 2012-12-13 HISTORY — PX: COLONOSCOPY WITH PROPOFOL: SHX5780

## 2012-12-13 LAB — GLUCOSE, CAPILLARY: Glucose-Capillary: 147 mg/dL — ABNORMAL HIGH (ref 70–99)

## 2012-12-13 SURGERY — COLONOSCOPY WITH PROPOFOL
Anesthesia: Monitor Anesthesia Care

## 2012-12-13 MED ORDER — PROPOFOL INFUSION 10 MG/ML OPTIME
INTRAVENOUS | Status: DC | PRN
  Administered 2012-12-13: 200 ug/kg/min via INTRAVENOUS

## 2012-12-13 MED ORDER — FENTANYL CITRATE 0.05 MG/ML IJ SOLN
INTRAMUSCULAR | Status: DC | PRN
  Administered 2012-12-13: 100 ug via INTRAVENOUS

## 2012-12-13 MED ORDER — LIDOCAINE VISCOUS 2 % MT SOLN
OROMUCOSAL | Status: AC
  Filled 2012-12-13: qty 15

## 2012-12-13 MED ORDER — LACTATED RINGERS IV SOLN
INTRAVENOUS | Status: DC
  Administered 2012-12-13: 11:00:00 via INTRAVENOUS

## 2012-12-13 MED ORDER — MIDAZOLAM HCL 5 MG/5ML IJ SOLN
INTRAMUSCULAR | Status: DC | PRN
  Administered 2012-12-13: 2 mg via INTRAVENOUS

## 2012-12-13 MED ORDER — SODIUM CHLORIDE 0.9 % IV SOLN: INTRAVENOUS | Status: DC

## 2012-12-13 MED ORDER — LIDOCAINE HCL (CARDIAC) 20 MG/ML IV SOLN
INTRAVENOUS | Status: DC | PRN
  Administered 2012-12-13: 100 mg via INTRAVENOUS

## 2012-12-13 SURGICAL SUPPLY — 21 items

## 2012-12-13 NOTE — Anesthesia Preprocedure Evaluation (Signed)
Anesthesia Evaluation  Patient identified by MRN, date of birth, ID band Patient awake    Reviewed: Allergy & Precautions, H&P , NPO status , Patient's Chart, lab work & pertinent test results, reviewed documented beta blocker date and time   History of Anesthesia Complications Negative for: history of anesthetic complications  Airway Mallampati: II TM Distance: >3 FB Neck ROM: full    Dental  (+) Edentulous Upper, Edentulous Lower and Dental Advisory Given   Pulmonary shortness of breath and with exertion, asthma , sleep apnea (noncompliant with CPAP) , pneumonia -, COPD COPD inhaler,  breath sounds clear to auscultation        Cardiovascular hypertension, Pt. on home beta blockers and Pt. on medications + Peripheral Vascular Disease + dysrhythmias Atrial Fibrillation Rhythm:regular  07/2012 Echo EF 55-65% moderate LVH    Neuro/Psych negative neurological ROS  negative psych ROS   GI/Hepatic Neg liver ROS, GERD-  Medicated,  Endo/Other  diabetes, Type 2, Oral Hypoglycemic AgentsCBG 158  Renal/GU negative Renal ROS     Musculoskeletal negative musculoskeletal ROS (+)   Abdominal (+) + obese,   Peds  Hematology negative hematology ROS (+)   Anesthesia Other Findings See surgeon's H&P   Reproductive/Obstetrics negative OB ROS                           Anesthesia Physical  Anesthesia Plan  ASA: III  Anesthesia Plan: MAC   Post-op Pain Management:    Induction: Intravenous  Airway Management Planned:   Additional Equipment:   Intra-op Plan:   Post-operative Plan:   Informed Consent: I have reviewed the patients History and Physical, chart, labs and discussed the procedure including the risks, benefits and alternatives for the proposed anesthesia with the patient or authorized representative who has indicated his/her understanding and acceptance.   Dental Advisory Given  Plan  Discussed with: CRNA and Surgeon  Anesthesia Plan Comments:         Anesthesia Quick Evaluation

## 2012-12-13 NOTE — Interval H&P Note (Signed)
History and Physical Interval Note:  12/13/2012 11:06 AM  [name]  has presented today for surgery, with the diagnosis of polyp surv. TUBULAR ADENOMAS, pt on Coumadin  The various methods of treatment have been discussed with the patient and family. After consideration of risks, benefits and other options for treatment, the patient has consented to  Procedure(s): COLONOSCOPY WITH PROPOFOL (N/A) as a surgical intervention .  The patient's history has been reviewed, patient examined, no change in status, stable for surgery.  I have reviewed the patient's chart and labs.  Questions were answered to the patient's satisfaction.     Rob Bunting

## 2012-12-13 NOTE — Anesthesia Postprocedure Evaluation (Signed)
Anesthesia Post Note  Patient: [name]  Procedure(s) Performed: Procedure(s) (LRB): COLONOSCOPY WITH PROPOFOL (N/A)  Anesthesia type: MAC  Patient location: PACU  Post pain: Pain level controlled  Post assessment: Post-op Vital signs reviewed  Last Vitals: BP 122/65  Pulse 69  Temp(Src) 36.6 C (Oral)  Resp 10  SpO2 94%  Post vital signs: Reviewed  Level of consciousness: awake  Complications: No apparent anesthesia complications

## 2012-12-13 NOTE — Op Note (Signed)
[facility] [address] [place], [address]   COLONOSCOPY PROCEDURE REPORT  PATIENT: [name], [name]  MR#: [id number] BIRTHDATE: 1955/11/09 , 57  yrs. old GENDER: Male ENDOSCOPIST: Rachael Fee, MD PROCEDURE DATE:  12/13/2012 PROCEDURE:   Colonoscopy with snare polypectomy ASA CLASS:   Class III INDICATIONS:nine adenomatous polyps removed 2013, one with villous component. MEDICATIONS: MAC sedation, administered by CRNA  DESCRIPTION OF PROCEDURE:   After the risks benefits and alternatives of the procedure were thoroughly explained, informed consent was obtained.  A digital rectal exam revealed no abnormalities of the rectum.   The EC-[id number] ([id number])  endoscope was introduced through the anus and advanced to the cecum, which was identified by both the appendix and ileocecal valve. No adverse events experienced.   The quality of the prep was good.  The instrument was then slowly withdrawn as the colon was fully examined.   COLON FINDINGS: Two polyps were found, removed and sent to path. These were both sessile; 2-29mm across; located in transverse and descending segments; removed with cold snare.  The site of 2013 sigmoid polypectomy was clearly noted by [place]k tatoo.  This was normal appearing.  The examination was otherwise normal. Retroflexed views revealed no abnormalities. The time to cecum=3 minutes 00 seconds.  Withdrawal time=10 minutes 00 seconds.  The scope was withdrawn and the procedure completed. COMPLICATIONS: There were no complications.  ENDOSCOPIC IMPRESSION: Two polyps were found, removed and sent to path. The site of 2013 sigmoid polypectomy was clearly noted by [place] tatoo.  This was normal appearing. The examination was otherwise normal.  RECOMMENDATIONS: If the polyp(s) removed today are proven to be adenomatous (pre-cancerous) polyps, you will need a colonoscopy in 3 years. You will receive a letter within 1-2 weeks  with the results of your biopsy as well as final recommendations.  Please call my office if you have not received a letter after 3 weeks.   eSigned:  Rachael Fee, MD 12/13/2012 11:58 AM

## 2012-12-13 NOTE — H&P (View-Only) (Signed)
11/22/2012 Kin D Venturino [id number] 03/24/1956   History of Present Illness:  57 y.o. male with  significant COPD, atrial fibrillation/flutter on coumadin, DM, HTN, and hyperlipidemia who underwent a colonoscopy by Dr. Jacobs 09/2011 at which time he had 9 polyps removed.  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  Largest polyp was in the sigmoid colon and was marked with ink.  No new health issues since last year.  Chronic health problems seem fairly stable.  No GI complaints at this time.  Current Medications, Allergies, Past Medical History, Past Surgical History, Family History and Social History were reviewed in [facility] electronic medical record.   Physical Exam: BP 166/80  Pulse 72  Ht 6' 3" (1.905 m)  Wt 263 lb (119.296 kg)  BMI 32.87 kg/m2  SpO2 98% General: Chronically ill-appearing male, appears older than stated age.  SOB, but in no NAD. Head: Normocephalic and atraumatic Eyes:  sclerae anicteric, conjunctiva pink  Ears: Normal auditory acuity Lungs: Inspiratory and expiratory wheezing heard B/L. Heart: Regular rate and rhythm Abdomen: Soft, non-tender and non-distended. No masses, no hepatomegaly. Normal bowel sounds. Rectal: Deferred.  Will be done at the time of colonoscopy. Musculoskeletal: Symmetrical with no gross deformities  Extremities: No edema  Neurological: Alert oriented x 4, grossly nonfocal Psychological:  Alert and cooperative. Normal mood and affect  Assessment and Recommendations: 57 y.o. male with  significant COPD, atrial fibrillation/flutter on coumadin, DM, HTN, and hyperlipidemia who underwent a colonoscopy by Dr. Jacobs 09/2011 at which time he had 9 polyps removed.  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  Will schedule colonoscopy with MAC sedation at [facility].  The risks, benefits, and alternatives of the procedure were discussed with the  patient and he consents to proceed.  The risks benefits and alternatives to a temporary hold of anti-coagulants/anti-platelets for the procedure were discussed with the patient he consents to proceed. Obtain clearance from Dr. Hochrein regarding holding his coumadin.        

## 2012-12-13 NOTE — Interval H&P Note (Signed)
History and Physical Interval Note:  12/13/2012 10:36 AM  [name]  has presented today for surgery, with the diagnosis of polyp surv. TUBULAR ADENOMAS, pt on Coumadin  The various methods of treatment have been discussed with the patient and family. After consideration of risks, benefits and other options for treatment, the patient has consented to  Procedure(s): COLONOSCOPY WITH PROPOFOL (N/A) as a surgical intervention .  The patient's history has been reviewed, patient examined, no change in status, stable for surgery.  I have reviewed the patient's chart and labs.  Questions were answered to the patient's satisfaction.     Rob Bunting

## 2012-12-13 NOTE — Transfer of Care (Signed)
Immediate Anesthesia Transfer of Care Note  Patient: [name]  Procedure(s) Performed: Procedure(s): COLONOSCOPY WITH PROPOFOL (N/A)  Patient Location: PACU  Anesthesia Type:MAC  Level of Consciousness: awake, alert , oriented and patient cooperative  Airway & Oxygen Therapy: Patient Spontanous Breathing and Patient connected to face mask oxygen  Post-op Assessment: Report given to PACU RN, Post -op Vital signs reviewed and stable and Patient moving all extremities X 4  Post vital signs: stable  Complications: No apparent anesthesia complications

## 2012-12-14 ENCOUNTER — Encounter (HOSPITAL_COMMUNITY): Payer: Self-pay | Admitting: Gastroenterology

## 2012-12-19 ENCOUNTER — Ambulatory Visit (INDEPENDENT_AMBULATORY_CARE_PROVIDER_SITE_OTHER): Payer: BC Managed Care – PPO | Admitting: *Deleted

## 2012-12-19 ENCOUNTER — Encounter: Payer: Self-pay | Admitting: Gastroenterology

## 2012-12-19 DIAGNOSIS — I4891 Unspecified atrial fibrillation: Secondary | ICD-10-CM

## 2012-12-19 DIAGNOSIS — Z7901 Long term (current) use of anticoagulants: Secondary | ICD-10-CM

## 2012-12-19 LAB — POCT INR: INR: 1.8

## 2012-12-31 ENCOUNTER — Other Ambulatory Visit: Payer: Self-pay

## 2012-12-31 MED ORDER — WARFARIN SODIUM 5 MG PO TABS: ORAL_TABLET | ORAL | Status: DC

## 2013-01-07 ENCOUNTER — Encounter: Payer: Self-pay | Admitting: Adult Health

## 2013-01-07 ENCOUNTER — Ambulatory Visit (INDEPENDENT_AMBULATORY_CARE_PROVIDER_SITE_OTHER): Payer: BC Managed Care – PPO | Admitting: Adult Health

## 2013-01-07 VITALS — BP 138/70 | HR 70 | Temp 98.9°F | Ht 73.5 in | Wt 263.4 lb

## 2013-01-07 DIAGNOSIS — J449 Chronic obstructive pulmonary disease, unspecified: Secondary | ICD-10-CM

## 2013-01-07 DIAGNOSIS — G4733 Obstructive sleep apnea (adult) (pediatric): Secondary | ICD-10-CM

## 2013-01-07 NOTE — Progress Notes (Signed)
  Subjective:    Patient ID: [name], male    DOB: 1955-09-08, 57 y.o.   MRN: [id number]  HPI  PCP - Aida Puffer  57/M, smoker with atrial fibrillation for FU of COPD & sleep apnea.  He had atrial fibrillation treated with flecainide in the past. He had TEE/DCCV. He is maintaining sinus rhythm,is on anticoagulation.  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  He reports episodes of coughing which have made him 'pass out'. He reports sinus drainage for many years but has never seen ENT.  He has severe sleep apnea but couldn't afford CPAP. Reviewed PSG from [facility] - severe with AHI 31.h corrected by CPAP 5 cm (wt was 250 lbs)  7/13 Underwent neck sx >> given pre-op prednisone for asthmatic bronchitis -uneventful post op  lisinopril & propranolol were stopped -on bisoprolol instead - BP ok  8/13 CAT 17  Spirometry fev1 68%   07/09/12  he has good and bad days with his breathing. has very little cough, chest tx occasionally but no wheezing. He has not had no inhalers only uses the nebs bid. Ran out of dulera pt states he uses his CPAP every once in a while, has large leak when he moves Smokes 3-4 cigs/d >>no changes   01/07/2013 Follow up  6 month follow up COPD.   reports breathing is unchanged since last ov.  denies any complaints.   No flare in cough or wheezing .  Still smoking , smoking cessation discussed  Wears CPAP every once in a while .  Discussed compliance . No edema, chest pain or edema.    Review of Systems  neg for any significant sore throat, dysphagia, itching, sneezing, nasal congestion or excess/ purulent secretions, fever, chills, sweats, unintended wt loss, pleuritic or exertional cp, hempoptysis, orthopnea pnd or change in chronic leg swelling. Also denies presyncope, palpitations, heartburn, abdominal pain, nausea, vomiting, diarrhea or change in  bowel or urinary habits, dysuria,hematuria, rash, arthralgias, visual complaints, headache, numbness weakness or ataxia.     Objective:   Physical Exam   Gen. Pleasant, obese, in no distress ENT - no lesions, no post nasal drip Neck: No JVD, no thyromegaly, no carotid bruits Lungs: no use of accessory muscles, no dullness to percussion, decreased without rales or rhonchi  Cardiovascular: Rhythm regular, heart sounds  normal, no murmurs or gallops, no peripheral edema Musculoskeletal: No deformities, no cyanosis or clubbing , no tremors         Assessment & Plan:

## 2013-01-07 NOTE — Patient Instructions (Addendum)
Must quit smoking -most important goal .  May use Pro Air 2 puffs or Albuterol Neb every 4hr as needed.  Wear CPAP each night.  Work on weight loss.  Follow up Dr. Vassie Loll  In 6 months and As needed

## 2013-01-10 NOTE — Assessment & Plan Note (Signed)
Compensated  Advised on smoking cessation   Plan  must quit smoking -most important goal .  May use Pro Air 2 puffs or Albuterol Neb every 4hr as needed.  Follow up Dr. Vassie Loll  In 6 months and As needed

## 2013-01-10 NOTE — Assessment & Plan Note (Signed)
Wear CPAP each night.  Work on weight loss.  Follow up Dr. Vassie Loll  In 6 months and As needed

## 2013-01-11 ENCOUNTER — Ambulatory Visit (INDEPENDENT_AMBULATORY_CARE_PROVIDER_SITE_OTHER): Payer: BC Managed Care – PPO

## 2013-01-11 DIAGNOSIS — Z7901 Long term (current) use of anticoagulants: Secondary | ICD-10-CM

## 2013-01-11 DIAGNOSIS — I4891 Unspecified atrial fibrillation: Secondary | ICD-10-CM

## 2013-01-31 ENCOUNTER — Ambulatory Visit (INDEPENDENT_AMBULATORY_CARE_PROVIDER_SITE_OTHER): Payer: BC Managed Care – PPO | Admitting: *Deleted

## 2013-01-31 DIAGNOSIS — Z7901 Long term (current) use of anticoagulants: Secondary | ICD-10-CM

## 2013-01-31 DIAGNOSIS — I4891 Unspecified atrial fibrillation: Secondary | ICD-10-CM

## 2013-02-14 ENCOUNTER — Ambulatory Visit (INDEPENDENT_AMBULATORY_CARE_PROVIDER_SITE_OTHER): Payer: BC Managed Care – PPO | Admitting: General Practice

## 2013-02-14 DIAGNOSIS — I4891 Unspecified atrial fibrillation: Secondary | ICD-10-CM

## 2013-02-14 DIAGNOSIS — Z7901 Long term (current) use of anticoagulants: Secondary | ICD-10-CM

## 2013-02-14 LAB — POCT INR: INR: 3

## 2013-02-15 ENCOUNTER — Ambulatory Visit (INDEPENDENT_AMBULATORY_CARE_PROVIDER_SITE_OTHER): Payer: BC Managed Care – PPO | Admitting: Cardiology

## 2013-02-15 ENCOUNTER — Encounter: Payer: Self-pay | Admitting: Cardiology

## 2013-02-15 VITALS — BP 140/68 | HR 65 | Ht 73.5 in | Wt 262.0 lb

## 2013-02-15 DIAGNOSIS — I480 Paroxysmal atrial fibrillation: Secondary | ICD-10-CM

## 2013-02-15 DIAGNOSIS — I4891 Unspecified atrial fibrillation: Secondary | ICD-10-CM

## 2013-02-15 DIAGNOSIS — I10 Essential (primary) hypertension: Secondary | ICD-10-CM

## 2013-02-15 DIAGNOSIS — R079 Chest pain, unspecified: Secondary | ICD-10-CM

## 2013-02-15 DIAGNOSIS — I4892 Unspecified atrial flutter: Secondary | ICD-10-CM

## 2013-02-15 NOTE — Progress Notes (Signed)
HPI The patient presents for followup of atrial flutter and atrial fibrillation.  He is status post flutter ablation. He still has some paroxysms of rapid rates with some episodes of feeling like it pause when it terminates. He thinks these are less frequent than previous. He's had no syncope. He does get some burning that radiates through to his chest. This happens sporadically. It happens at rest. It goes away and comes spontaneously. He doesn't describe associated symptoms. It doesn't seem to be any radiation to his jaw or to his arms. Overall he thinks he feels better. He denies any acute shortness of breath, PND or orthopnea. He's not having any palpitations, presyncope or syncope. He has had no weight gain or edema.  He still smokes a few cigarettes per month.   Allergies  Allergen Reactions  . Latex Itching    Current Outpatient Prescriptions  Medication Sig Dispense Refill  . albuterol (ACCUNEB) 1.25 MG/3ML nebulizer solution Take 1 ampule by nebulization daily as needed for shortness of breath.       [name] albuterol (PROVENTIL HFA;VENTOLIN HFA) 108 (90 BASE) MCG/ACT inhaler Inhale 2 puffs into the lungs every 6 (six) hours as needed for wheezing or shortness of breath.      . bisoprolol (ZEBETA) 10 MG tablet Take 10 mg by mouth every morning.       . Cholecalciferol (VITAMIN D) 2000 UNITS CAPS Take 1 capsule by mouth daily.      . diazepam (VALIUM) 5 MG tablet Take 5 mg by mouth every 6 (six) hours as needed (for muscle spasms).      . flecainide (TAMBOCOR) 100 MG tablet Take 1 tablet (100 mg total) by mouth 2 (two) times daily.  60 tablet  5  . furosemide (LASIX) 40 MG tablet Take 40 mg by mouth daily as needed (swelling). For swelling      . glimepiride (AMARYL) 1 MG tablet Take 1 mg by mouth daily before breakfast.       . HYDROcodone-acetaminophen (NORCO/VICODIN) 5-325 MG per tablet Take 1 tablet by mouth every 6 (six) hours as needed for pain.      . Liraglutide (VICTOZA) 18 MG/3ML  SOPN Inject into the skin daily.       . metFORMIN (GLUCOPHAGE) 500 MG tablet Take 500 mg by mouth 2 (two) times daily with a meal.       . Multiple Vitamin (MULTIVITAMIN WITH MINERALS) TABS Take 1 tablet by mouth daily.      [name] omeprazole (PRILOSEC) 20 MG capsule Take 20 mg by mouth 2 (two) times daily.       . potassium chloride (KLOR-CON 10) 10 MEQ tablet Take 10 mEq by mouth at bedtime as needed (for leg discomfort).       . pravastatin (PRAVACHOL) 80 MG tablet Take 1 tablet (80 mg total) by mouth at bedtime.  30 tablet  6  . warfarin (COUMADIN) 5 MG tablet Take as directed by anticoagulation clinic  45 tablet  3   No current facility-administered medications for this visit.    Past Medical History  Diagnosis Date  . HLD (hyperlipidemia)     takes Pravastatin daily  . Obesity   . Tobacco abuse   . Overdose 2009    unintentional Flacanide overdose  . HTN (hypertension)     takes Prinizide daily  . Dyspnea     LHC 11/08: EF 60%, normal coronary arteries;  ETT-echo 2/11: normal at 71% PMHR  . Shortness of breath  lying/sitting/exertion  . Seasonal allergies     was on Claritin-stopped taking this pt states it wasn't helping  . Coughing   . History of bronchitis 25yrs ago  . COPD (chronic obstructive pulmonary disease)   . Emphysema   . Peripheral edema     takes Furosemide as needed  . Hypokalemia     hx of;pt states he was taking K+ but now eats a banana daily instead  . Dizziness   . Short-term memory loss   . Arthritis     lower back  . Chronic back pain     DDD/stenosis  . Bruises easily     takes Coumadin daily  . GERD (gastroesophageal reflux disease)     takes Omeprazole bid  . Colon polyps     9 polyps removed 10/13/11  . Urinary frequency   . Urinary urgency   . Diabetes mellitus     takes Metformin and Glimepiride daily  . History of shingles   . Atrial flutter     s/p CTI ablation by Dr Johney Frame  . Paroxysmal atrial fibrillation   . Pneumonia 3-4 yrs  ago    none recent  . OSA (obstructive sleep apnea)     not always using cpap  . Brain aneurysm 2009    questionable. A follow up CTA in 2009 showed no evidence of  . Peripheral neuropathy   . Complication of anesthesia September 11, 2012    slow to awaken after ablation    Past Surgical History  Procedure Laterality Date  . Neck surgery  98yrs ago  . Right shoulder surgery  4-81yrs ago    cyst removed  . Left inguinal hernia repair      as a child  . Tee without cardioversion  05/05/2011    Procedure: TRANSESOPHAGEAL ECHOCARDIOGRAM (TEE);  Surgeon: Lewayne Bunting, MD;  Location: [facility] ENDOSCOPY;  Service: Cardiovascular;  Laterality: N/A;  . Cardioversion  05/05/2011    Procedure: CARDIOVERSION;  Surgeon: Lewayne Bunting, MD;  Location: [facility] ENDOSCOPY;  Service: Cardiovascular;  Laterality: N/A;  . Throat surgery  4-68yrs ago    "thought " it was cancer but came back not  . Carpal tunnel release  99/2000    bilateral  . Appendectomy  2-22yrs ago  . Knee surgery  6-13yrs ago    left  . Cardiac catheterization  2008    no significant CAD  . Tonsillectomy    . Hernia repair    . Anterior cervical decomp/discectomy fusion  01/04/2012    Procedure: ANTERIOR CERVICAL DECOMPRESSION/DISCECTOMY FUSION 1 LEVEL/HARDWARE REMOVAL;  Surgeon: Cristi Loron, MD;  Location: [facility] NEURO ORS;  Service: Neurosurgery;  Laterality: N/A;  explore cervical fusion Cervical six - seven  with removal of codman plate anterior cervical decompression with fusion interbody prothesis plating and bonegraft  . Cardioversion Bilateral 07/26/2012    Procedure: CARDIOVERSION;  Surgeon: Rollene Rotunda, MD;  Location[facility] ENDOSCOPY;  Service: Cardiovascular;  Laterality: Bilateral;  . Atrial flutter ablation  09/11/2012    CTI ablation by Dr Johney Frame  . Colonoscopy with propofol N/A 12/13/2012    Procedure: COLONOSCOPY WITH PROPOFOL;  Surgeon: Rachael Fee, MD;  Location: [facility] ENDOSCOPY;  Service: Endoscopy;  Laterality: N/A;     ROS:  Burning in feet and back pain.  Otherwise as stated in the HPI and negative for all other systems.  PHYSICAL EXAM BP 140/68  Pulse 65  Ht 6' 1.5" (1.867 m)  Wt 262 lb (118.842 kg)  BMI 34.09 kg/m2 GENERAL:  Well appearing HEENT:  Pupils equal round and reactive, fundi not visualized, oral mucosa , edentulous NECK:  No jugular venous distention, waveform within normal limits, carotid upstroke brisk and symmetric, no bruits, no thyromegaly LYMPHATICS:  No cervical, inguinal adenopathy LUNGS:  Clear to auscultation bilaterally, diminished BS. BACK:  No CVA tenderness CHEST:  Unremarkable HEART:  PMI not displaced or sustained,S1 and S2 within normal limits, no S3, no S4, no clicks, no rubs, no murmurs, regular ABD:  Flat, positive bowel sounds normal in frequency in pitch, no bruits, no rebound, no guarding, no midline pulsatile mass, no hepatomegaly, no splenomegaly EXT:  2 plus pulses throughout, no edema, no cyanosis no clubbing SKIN:  No rashes no nodules, ruddy complexion less pronounced  EKG:  Sinus rhythm, rate 65, axis within normal limits, intervals within normal no acute ST-T wave changes.  02/15/2013   ASSESSMENT AND PLAN  ATRIAL FLUTTER:  I think for the most part he is successfully managed. If he continues to have episodes of pauses to let me know and we might need to consider an event monitor or further EP referral.  HTN:  I stopped his hydrochlorothiazide at the last visit and he seems to be controlled. No change in therapy is indicated.  TOBACCO ABUSE:  He says his only smoking a few cigarettes per month. I have suggested complete abstinence.  ALCOHOL USE:  He reports that he is no longer drinking.   CHEST DISCOMFORT:  Since he continues to have this complaint and he had a submaximal stress test previously I will plan another stress test but this will need to be a [employer].

## 2013-02-15 NOTE — Patient Instructions (Addendum)
The current medical regimen is effective;  continue present plan and medications.  Your physician has requested that you have a lexiscan myoview. For further information please visit [email or web address]. Please follow instruction sheet, as given.  Follow up in 6 months with Dr Hochrein.  You will receive a letter in the mail 2 months before you are due.  Please call us when you receive this letter to schedule your follow up appointment.  

## 2013-02-18 ENCOUNTER — Ambulatory Visit (HOSPITAL_COMMUNITY): Payer: BC Managed Care – PPO | Attending: Cardiology | Admitting: Radiology

## 2013-02-18 VITALS — BP 141/77 | Ht 75.0 in | Wt 262.0 lb

## 2013-02-18 DIAGNOSIS — J4489 Other specified chronic obstructive pulmonary disease: Secondary | ICD-10-CM | POA: Insufficient documentation

## 2013-02-18 DIAGNOSIS — Z8249 Family history of ischemic heart disease and other diseases of the circulatory system: Secondary | ICD-10-CM | POA: Insufficient documentation

## 2013-02-18 DIAGNOSIS — I4891 Unspecified atrial fibrillation: Secondary | ICD-10-CM | POA: Insufficient documentation

## 2013-02-18 DIAGNOSIS — R079 Chest pain, unspecified: Secondary | ICD-10-CM | POA: Insufficient documentation

## 2013-02-18 DIAGNOSIS — R0602 Shortness of breath: Secondary | ICD-10-CM

## 2013-02-18 DIAGNOSIS — F17200 Nicotine dependence, unspecified, uncomplicated: Secondary | ICD-10-CM | POA: Insufficient documentation

## 2013-02-18 DIAGNOSIS — J449 Chronic obstructive pulmonary disease, unspecified: Secondary | ICD-10-CM | POA: Insufficient documentation

## 2013-02-18 DIAGNOSIS — I10 Essential (primary) hypertension: Secondary | ICD-10-CM | POA: Insufficient documentation

## 2013-02-18 MED ORDER — TECHNETIUM TC 99M SESTAMIBI GENERIC - CARDIOLITE
33.0000 | Freq: Once | INTRAVENOUS | Status: AC | PRN
  Administered 2013-02-18: 33 via INTRAVENOUS

## 2013-02-18 MED ORDER — TECHNETIUM TC 99M SESTAMIBI GENERIC - CARDIOLITE
11.0000 | Freq: Once | INTRAVENOUS | Status: AC | PRN
  Administered 2013-02-18: 11 via INTRAVENOUS

## 2013-02-18 MED ORDER — REGADENOSON 0.4 MG/5ML IV SOLN
0.4000 mg | Freq: Once | INTRAVENOUS | Status: AC
  Administered 2013-02-18: 0.4 mg via INTRAVENOUS

## 2013-02-18 NOTE — Progress Notes (Signed)
[facility] SITE 3 NUCLEAR MED [address] [place], [place] [address] [phone]    Cardiology Nuclear Med Study  [name] is a 57 y.o. male     MRN : [id number]     DOB: 04/27/1956  Procedure Date: 02/18/2013  Nuclear Med Background Indication for Stress Test:  Evaluation for Ischemia History:  '08 Heart Catheterization: EF=60%,normal;2/13 GXT: poor exercise tolerance;3/14 Atrial Fib> Ablation;Echo: EF=55-60%Asthma;COPD Cardiac Risk Factors: Family History - CAD, Hypertension and Smoker  Symptoms:  Chest Pain   Nuclear Pre-Procedure Caffeine/Decaff Intake:  None NPO After: 10:00pm   Lungs:  clear O2 Sat: 96% on room air. IV 0.9% NS with Angio Cath:  20g  IV Site: R Hand  IV Started by:  Cathlyn Parsons, RN  Chest Size (in):  52 Cup Size: n/a  Height: 6\' 3"  (1.905 m)  Weight:  262 lb (118.842 kg)  BMI:  Body mass index is 32.75 kg/(m^2). Tech Comments:  Bisoprolol taken this am at 0700    Nuclear Med Study 1 or 2 day study: 1 day  Stress Test Type:  Treadmill/Lexiscan  Reading MD: Olga Millers, MD  Order Authorizing Provider:  Melany Guernsey  Resting Radionuclide: Technetium 3m Sestamibi  Resting Radionuclide Dose: 11.0 mCi   Stress Radionuclide:  Technetium 53m Sestamibi  Stress Radionuclide Dose: 33.0 mCi           Stress Protocol Rest HR: 63 Stress HR: 85  Rest BP: 141/77 Stress BP: 144/81  Exercise Time (min): n/a METS: n/a   Predicted Max HR: 165 bpm % Max HR: 51.52 bpm Rate Pressure Product: 78469   Dose of Adenosine (mg):  n/a Dose of Lexiscan: 0.4 mg  Dose of Atropine (mg): n/a Dose of Dobutamine: n/a mcg/kg/min (at max HR)  Stress Test Technologist: Milana Na, EMT-P  Nuclear Technologist:  Domenic Polite, CNMT     Rest Procedure:  Myocardial perfusion imaging was performed at rest 45 minutes following the intravenous administration of Technetium 28m Sestamibi. Rest ECG: NSR - Normal EKG  Stress Procedure:  The  patient received IV Lexiscan 0.4 mg over 15-seconds.  Technetium 66m Sestamibi injected at 30-seconds. This patient was sob,felt weird, and had chest heaviness with the Lexiscan injection. Quantitative spect images were obtained after a 45 minute delay. Stress ECG: No significant ST segment change suggestive of ischemia.  QPS Raw Data Images:  Acquisition technically good; normal left ventricular size. Stress Images:  There is decreased uptake in the apex and inferior wall. Rest Images:  There is decreased uptake in the inferior wall less prominent compared to the stress images. Subtraction (SDS):  These findings are consistent with mild apical ischemia; prior inferior infarct and mild peri-infarct ischemia. Transient Ischemic Dilatation (Normal <1.22):  n/a Lung/Heart Ratio (Normal <0.45):  0.46  Quantitative Gated Spect Images QGS EDV:  201 ml QGS ESV:  94 ml  Impression Exercise Capacity:  Lexiscan with no exercise. BP Response:  Normal blood pressure response. Clinical Symptoms:  There is chest pressure and dyspnea. ECG Impression:  No significant ST segment change suggestive of ischemia. Comparison with Prior Nuclear Study: No previous nuclear study performed  Overall Impression:  Low risk stress nuclear study with a small, severe intensity reversible apical defect; moderate size, medium intensity, partially reversible inferior defect; findings c/w with mild apical ischemia; prior inferior infarct and mild peri-infarct ischemia..  LV Ejection Fraction: 53%.  LV Wall Motion:  NL LV Function; NL Wall Motion  Olga Millers

## 2013-02-19 ENCOUNTER — Other Ambulatory Visit: Payer: Self-pay

## 2013-02-19 MED ORDER — PRAVASTATIN SODIUM 80 MG PO TABS: 80.0000 mg | ORAL_TABLET | Freq: Every day | ORAL | Status: DC

## 2013-03-07 ENCOUNTER — Ambulatory Visit (INDEPENDENT_AMBULATORY_CARE_PROVIDER_SITE_OTHER): Payer: BC Managed Care – PPO | Admitting: *Deleted

## 2013-03-07 DIAGNOSIS — I4891 Unspecified atrial fibrillation: Secondary | ICD-10-CM

## 2013-03-07 DIAGNOSIS — Z7901 Long term (current) use of anticoagulants: Secondary | ICD-10-CM

## 2013-03-11 ENCOUNTER — Telehealth: Payer: Self-pay | Admitting: Cardiology

## 2013-03-11 NOTE — Telephone Encounter (Signed)
Spoke with pt who states for th past 1 and 1/2 to 2 weeks his At Fib has been worse.  When we were first talking he stated he has been in and out of At Fib several times and some times it seems bad but right now he doesn't feel like its all that bad.  At times he states when he is in At Fib and his rate feels fast he becomes very SOB.  Advised he is to continue taking his medications as listed and to call back if further needs.  He has been scheduled for a follow up appt with Dr Antoine Poche.  He will call back if he gets worse and feels like he needs to go to the ED.

## 2013-03-11 NOTE — Telephone Encounter (Signed)
New Problem    Pt states that his heart has been out of rhythm for the past few days// Made appt for 11/4 @ 10:30/// Pt requests a call back if a sooner appt is needed.

## 2013-03-12 ENCOUNTER — Other Ambulatory Visit: Payer: Self-pay | Admitting: *Deleted

## 2013-03-12 MED ORDER — FLECAINIDE ACETATE 100 MG PO TABS: 100.0000 mg | ORAL_TABLET | Freq: Two times a day (BID) | ORAL | Status: DC

## 2013-03-19 ENCOUNTER — Encounter: Payer: Self-pay | Admitting: Cardiology

## 2013-03-19 ENCOUNTER — Ambulatory Visit (INDEPENDENT_AMBULATORY_CARE_PROVIDER_SITE_OTHER): Payer: BC Managed Care – PPO | Admitting: Cardiology

## 2013-03-19 VITALS — BP 158/84 | HR 69 | Ht 74.0 in | Wt 262.0 lb

## 2013-03-19 DIAGNOSIS — E785 Hyperlipidemia, unspecified: Secondary | ICD-10-CM

## 2013-03-19 DIAGNOSIS — I480 Paroxysmal atrial fibrillation: Secondary | ICD-10-CM

## 2013-03-19 DIAGNOSIS — I4892 Unspecified atrial flutter: Secondary | ICD-10-CM

## 2013-03-19 DIAGNOSIS — Z720 Tobacco use: Secondary | ICD-10-CM

## 2013-03-19 DIAGNOSIS — I4891 Unspecified atrial fibrillation: Secondary | ICD-10-CM

## 2013-03-19 DIAGNOSIS — F17200 Nicotine dependence, unspecified, uncomplicated: Secondary | ICD-10-CM

## 2013-03-19 DIAGNOSIS — I10 Essential (primary) hypertension: Secondary | ICD-10-CM

## 2013-03-19 LAB — BASIC METABOLIC PANEL
BUN: 7 mg/dL (ref 6–23)
Chloride: 102 mEq/L (ref 96–112)
Creatinine, Ser: 0.6 mg/dL (ref 0.4–1.5)
Glucose, Bld: 162 mg/dL — ABNORMAL HIGH (ref 70–99)
Potassium: 3.7 mEq/L (ref 3.5–5.1)

## 2013-03-19 LAB — MAGNESIUM: Magnesium: 1.8 mg/dL (ref 1.5–2.5)

## 2013-03-19 NOTE — Patient Instructions (Addendum)
Please stop your Flecainide on Sunday. Continue all other medications as listed.  Please have blood work today. (BMP and MG level)  You will be contacted by Admitting at [facility] about when to report on Thursday (11/13) for your admission to be started on Tikosyn.

## 2013-03-19 NOTE — Progress Notes (Signed)
HPI The patient presents for followup of atrial flutter and atrial fibrillation.  He is status post flutter ablation. He still has some paroxysms of rapid rates with some episodes of feeling like it pause when it terminates. He was having some chest discomfort and I sent him for a stress perfusion study which was a low risk scan that suggested some apical ischemia. I brought him back to talk about this today and conferred with Dr. Johney Frame.  Given this result although it could be a false-positive he should not be taking flecainide. It turns out today that he's been having increase palpitations anyway on this medication. He's having more paroxysms of what has been his fibrillation in the past. He's not getting any new chest discomfort, neck or arm discomfort. He's not had any syncope he sometimes has brief episodes of lightheadedness with this. He's had no new PND or orthopnea.   Allergies  Allergen Reactions  . Latex Itching    Current Outpatient Prescriptions  Medication Sig Dispense Refill  . albuterol (ACCUNEB) 1.25 MG/3ML nebulizer solution Take 1 ampule by nebulization daily as needed for shortness of breath.       [name] albuterol (PROVENTIL HFA;VENTOLIN HFA) 108 (90 BASE) MCG/ACT inhaler Inhale 2 puffs into the lungs every 6 (six) hours as needed for wheezing or shortness of breath.      . bisoprolol (ZEBETA) 10 MG tablet Take 10 mg by mouth every morning.       . Cholecalciferol (VITAMIN D) 2000 UNITS CAPS Take 1 capsule by mouth daily.      . diazepam (VALIUM) 5 MG tablet Take 5 mg by mouth every 6 (six) hours as needed (for muscle spasms).      . flecainide (TAMBOCOR) 100 MG tablet Take 1 tablet (100 mg total) by mouth 2 (two) times daily.  60 tablet  5  . furosemide (LASIX) 40 MG tablet Take 40 mg by mouth daily as needed (swelling). For swelling      . glimepiride (AMARYL) 1 MG tablet Take 1 mg by mouth daily before breakfast.       . HYDROcodone-acetaminophen (NORCO/VICODIN) 5-325 MG per  tablet Take 1 tablet by mouth every 6 (six) hours as needed for pain.      . Liraglutide (VICTOZA) 18 MG/3ML SOPN Inject into the skin daily.       . metFORMIN (GLUCOPHAGE) 500 MG tablet Take 500 mg by mouth 2 (two) times daily with a meal.       . Multiple Vitamin (MULTIVITAMIN WITH MINERALS) TABS Take 1 tablet by mouth daily.      [name] omeprazole (PRILOSEC) 20 MG capsule Take 20 mg by mouth 2 (two) times daily.       . pravastatin (PRAVACHOL) 80 MG tablet Take 1 tablet (80 mg total) by mouth at bedtime.  30 tablet  6  . warfarin (COUMADIN) 5 MG tablet Take as directed by anticoagulation clinic  45 tablet  3  . potassium chloride (KLOR-CON 10) 10 MEQ tablet Take 10 mEq by mouth at bedtime as needed (for leg discomfort).        No current facility-administered medications for this visit.    Past Medical History  Diagnosis Date  . HLD (hyperlipidemia)     takes Pravastatin daily  . Obesity   . Tobacco abuse   . Overdose 2009    unintentional Flacanide overdose  . HTN (hypertension)     takes Prinizide daily  . Dyspnea     LHC  11/08: EF 60%, normal coronary arteries;  ETT-echo 2/11: normal at 71% PMHR  . Shortness of breath     lying/sitting/exertion  . Seasonal allergies     was on Claritin-stopped taking this pt states it wasn't helping  . Coughing   . History of bronchitis 69yrs ago  . COPD (chronic obstructive pulmonary disease)   . Emphysema   . Peripheral edema     takes Furosemide as needed  . Hypokalemia     hx of;pt states he was taking K+ but now eats a banana daily instead  . Dizziness   . Short-term memory loss   . Arthritis     lower back  . Chronic back pain     DDD/stenosis  . Bruises easily     takes Coumadin daily  . GERD (gastroesophageal reflux disease)     takes Omeprazole bid  . Colon polyps     9 polyps removed 10/13/11  . Urinary frequency   . Urinary urgency   . Diabetes mellitus     takes Metformin and Glimepiride daily  . History of shingles     . Atrial flutter     s/p CTI ablation by Dr Johney Frame  . Paroxysmal atrial fibrillation   . Pneumonia 3-4 yrs ago    none recent  . OSA (obstructive sleep apnea)     not always using cpap  . Brain aneurysm 2009    questionable. A follow up CTA in 2009 showed no evidence of  . Peripheral neuropathy   . Complication of anesthesia September 11, 2012    slow to awaken after ablation    Past Surgical History  Procedure Laterality Date  . Neck surgery  96yrs ago  . Right shoulder surgery  4-25yrs ago    cyst removed  . Left inguinal hernia repair      as a child  . Tee without cardioversion  05/05/2011    Procedure: TRANSESOPHAGEAL ECHOCARDIOGRAM (TEE);  Surgeon: Lewayne Bunting, MD;  Location: [facility] ENDOSCOPY;  Service: Cardiovascular;  Laterality: N/A;  . Cardioversion  05/05/2011    Procedure: CARDIOVERSION;  Surgeon: Lewayne Bunting, MD;  Location: [facility] ENDOSCOPY;  Service: Cardiovascular;  Laterality: N/A;  . Throat surgery  4-1yrs ago    "thought " it was cancer but came back not  . Carpal tunnel release  99/2000    bilateral  . Appendectomy  2-67yrs ago  . Knee surgery  6-68yrs ago    left  . Cardiac catheterization  2008    no significant CAD  . Tonsillectomy    . Hernia repair    . Anterior cervical decomp/discectomy fusion  01/04/2012    Procedure: ANTERIOR CERVICAL DECOMPRESSION/DISCECTOMY FUSION 1 LEVEL/HARDWARE REMOVAL;  Surgeon: Cristi Loron, MD;  Location: [facility] NEURO ORS;  Service: Neurosurgery;  Laterality: N/A;  explore cervical fusion Cervical six - seven  with removal of codman plate anterior cervical decompression with fusion interbody prothesis plating and bonegraft  . Cardioversion Bilateral 07/26/2012    Procedure: CARDIOVERSION;  Surgeon: Rollene Rotunda, MD;  Location: [facility] ENDOSCOPY;  Service: Cardiovascular;  Laterality: Bilateral;  . Atrial flutter ablation  09/11/2012    CTI ablation by Dr Johney Frame  . Colonoscopy with propofol N/A 12/13/2012    Procedure: COLONOSCOPY  WITH PROPOFOL;  Surgeon: Rachael Fee, MD;  Location: [facility] ENDOSCOPY;  Service: Endoscopy;  Laterality: N/A;    ROS:  Burning in feet related to neuropathy and back pain.  Otherwise as stated in the HPI  and negative for all other systems.  PHYSICAL EXAM BP 158/84  Pulse 69  Ht 6\' 2"  (1.88 m)  Wt 262 lb (118.842 kg)  BMI 33.62 kg/m2 GENERAL:  Well appearing HEENT:  Pupils equal round and reactive, fundi not visualized, oral mucosa , edentulous NECK:  No jugular venous distention, waveform within normal limits, carotid upstroke brisk and symmetric, no bruits, no thyromegaly LYMPHATICS:  No cervical, inguinal adenopathy LUNGS:  Clear to auscultation bilaterally, diminished BS. BACK:  No CVA tenderness CHEST:  Unremarkable HEART:  PMI not displaced or sustained,S1 and S2 within normal limits, no S3, no S4, no clicks, no rubs, no murmurs, regular ABD:  Flat, positive bowel sounds normal in frequency in pitch, no bruits, no rebound, no guarding, no midline pulsatile mass, no hepatomegaly, no splenomegaly EXT:  2 plus pulses throughout, no edema, no cyanosis no clubbing SKIN:  No rashes no nodules, ruddy complexion less pronounced   EKG:   Sinus rhythm, rate 69, axis within normal limits, intervals within normal limits, nonspecific lateral T-wave inversions. 03/19/2013 ASSESSMENT AND PLAN  ATRIAL FLUTTER/FIB:  He needs to be off of the flecainide 1 because of the abnormal perfusion study and 2 because of the fact that it is not holding his rhythm symptomatically as much as it was. He will stop this on Sunday. We will plan to bring him to the hospital on Thursday for Tikosyn load.  HTN: The blood pressure is at target. No change in medications is indicated. We will continue with therapeutic lifestyle changes (TLC).  TOBACCO ABUSE:  He says his only smoking a few cigarettes per month. We talked about this again today.   ALCOHOL USE:  He reports that he is no longer drinking.   CHEST  DISCOMFORT:  He at low risk Myoview and no increased unstable symptoms. No catheterization is indicated.

## 2013-03-21 ENCOUNTER — Telehealth: Payer: Self-pay | Admitting: Cardiology

## 2013-03-21 NOTE — Telephone Encounter (Signed)
Spoke with pt.  Discussed lab results.  Will have him increase K to daily and recheck labs next week prior to admission for Tikosyn.

## 2013-03-21 NOTE — Telephone Encounter (Signed)
Follow Up  Pt states he received a call from Kennon Rounds to call the coumadin department/// He requests a call back

## 2013-03-25 ENCOUNTER — Telehealth: Payer: Self-pay | Admitting: Cardiology

## 2013-03-25 NOTE — Telephone Encounter (Signed)
New Problem  Pt states that he is scheduled to be in the hospital for surgery on Thursday. He states that he is not feeling well request a call back to discuss if he should still come in if he is sick.. Request a call back from [place].

## 2013-03-26 NOTE — Telephone Encounter (Signed)
Follow UP:  Pt is calling back from yesterday. Pt is still trying to find out if his hospital procedure needs to be rescheduled. Pt states he has a hospital appt on Thursday. Pt states he is sick and needs to speak to the nurse. I'm not seeing an upcoming admission in Epic. Pt states his hospital procedure is at [facility] this Thursday... Please advise

## 2013-03-26 NOTE — Telephone Encounter (Signed)
Pt called because he said is scheduled to start  Tikosyn treatment in the hospital this coming Thursday. Pt states he has been sick with a bad sinus infection and was prescribed Levocetirizine dihyde 5 mg once daily, Amoxicillin 500 mg - 125 mg twice a day and Methyl prednisone 4 mg one tablet daily, he is to take this medication for  Week. Pt would like to know if these medications would affect the Tikosyn.

## 2013-03-26 NOTE — Telephone Encounter (Signed)
Spoke with pt.  All of these medications will be fine with Tikosyn.  He states he feels much better today and wants to proceed with Thursday's admission as previously planned.

## 2013-03-27 ENCOUNTER — Telehealth: Payer: Self-pay | Admitting: Cardiology

## 2013-03-27 NOTE — Telephone Encounter (Signed)
Spoke with patient who states he stopped Flecainide Saturday in preparation for Tikosyn loading admission tomorrow 11/13.  Patient states since he stopped Flecainide he has felt great.  Patient states he has not felt this good in a long time.  Patient would like to know if he should postpone the admission tomorrow; is wondering if the ablation he had in April has worked and that medication is no longer needed.  I advised patient that I would send the message to Dr. Antoine Poche for his advice.

## 2013-03-27 NOTE — Telephone Encounter (Signed)
After speaking with the pt he decided to go ahead with the initiation of Tikosyn as planned.

## 2013-03-27 NOTE — Telephone Encounter (Signed)
New Problem  Pt states that since he was taken off of his medication for the procedure on Thursday he has not had any problems with his heart. He requests a call back to discuss if his appointment at the hospital on Thursday is even needed since he feels great and can he continue to stop taking the medication.. Please call back to discuss.

## 2013-03-28 ENCOUNTER — Ambulatory Visit (INDEPENDENT_AMBULATORY_CARE_PROVIDER_SITE_OTHER): Payer: BC Managed Care – PPO | Admitting: Pharmacist

## 2013-03-28 ENCOUNTER — Inpatient Hospital Stay: Admission: AD | Admit: 2013-03-28 | Payer: BC Managed Care – PPO | Source: Ambulatory Visit | Admitting: Cardiology

## 2013-03-28 ENCOUNTER — Ambulatory Visit: Payer: BC Managed Care – PPO | Admitting: Pharmacist

## 2013-03-28 DIAGNOSIS — Z7901 Long term (current) use of anticoagulants: Secondary | ICD-10-CM

## 2013-03-28 DIAGNOSIS — I4891 Unspecified atrial fibrillation: Secondary | ICD-10-CM

## 2013-04-25 ENCOUNTER — Ambulatory Visit (INDEPENDENT_AMBULATORY_CARE_PROVIDER_SITE_OTHER): Payer: BC Managed Care – PPO | Admitting: General Practice

## 2013-04-25 DIAGNOSIS — Z7901 Long term (current) use of anticoagulants: Secondary | ICD-10-CM

## 2013-04-25 DIAGNOSIS — I4891 Unspecified atrial fibrillation: Secondary | ICD-10-CM

## 2013-05-13 ENCOUNTER — Other Ambulatory Visit: Payer: Self-pay | Admitting: *Deleted

## 2013-05-13 MED ORDER — WARFARIN SODIUM 5 MG PO TABS: ORAL_TABLET | ORAL | Status: DC

## 2013-05-23 ENCOUNTER — Ambulatory Visit (INDEPENDENT_AMBULATORY_CARE_PROVIDER_SITE_OTHER): Payer: BC Managed Care – PPO | Admitting: *Deleted

## 2013-05-23 DIAGNOSIS — I4891 Unspecified atrial fibrillation: Secondary | ICD-10-CM

## 2013-05-23 DIAGNOSIS — Z7901 Long term (current) use of anticoagulants: Secondary | ICD-10-CM

## 2013-05-23 LAB — POCT INR: INR: 3

## 2013-06-20 ENCOUNTER — Ambulatory Visit (INDEPENDENT_AMBULATORY_CARE_PROVIDER_SITE_OTHER): Payer: BC Managed Care – PPO | Admitting: *Deleted

## 2013-06-20 DIAGNOSIS — Z5181 Encounter for therapeutic drug level monitoring: Secondary | ICD-10-CM | POA: Insufficient documentation

## 2013-06-20 DIAGNOSIS — Z7901 Long term (current) use of anticoagulants: Secondary | ICD-10-CM

## 2013-06-20 DIAGNOSIS — I4891 Unspecified atrial fibrillation: Secondary | ICD-10-CM

## 2013-06-20 LAB — POCT INR: INR: 2.8

## 2013-07-12 ENCOUNTER — Encounter: Payer: Self-pay | Admitting: Cardiology

## 2013-07-12 ENCOUNTER — Telehealth: Payer: Self-pay | Admitting: Cardiology

## 2013-07-12 NOTE — Telephone Encounter (Signed)
Spoke with pt.  Explained Etodolac will most likely not increase INR but may increase risk of bleeding.  He is only going to take it for 2 weeks.  Informed pt to make sure he takes with food and keep his follow up appt with the Coumadin clinic on 3/5.

## 2013-07-12 NOTE — Telephone Encounter (Signed)
lmtcb Ryan Marcos Ruelas RN  

## 2013-07-12 NOTE — Telephone Encounter (Signed)
Will forward to [facility] clinic

## 2013-07-12 NOTE — Telephone Encounter (Signed)
New problem    Pt need a call back on his cell about med prescribed by Dr Arnoldo Morale Med. ETODOLAC 400 mg B-lodine.

## 2013-07-12 NOTE — Telephone Encounter (Signed)
Follow up    Pt is returning our call.  Please call [phone].

## 2013-07-18 ENCOUNTER — Ambulatory Visit (INDEPENDENT_AMBULATORY_CARE_PROVIDER_SITE_OTHER): Payer: BC Managed Care – PPO

## 2013-07-18 DIAGNOSIS — Z7901 Long term (current) use of anticoagulants: Secondary | ICD-10-CM

## 2013-07-18 DIAGNOSIS — I4891 Unspecified atrial fibrillation: Secondary | ICD-10-CM

## 2013-07-18 DIAGNOSIS — Z5181 Encounter for therapeutic drug level monitoring: Secondary | ICD-10-CM

## 2013-07-18 LAB — POCT INR: INR: 2.9

## 2013-07-24 ENCOUNTER — Telehealth: Payer: Self-pay | Admitting: Cardiology

## 2013-07-24 NOTE — Telephone Encounter (Signed)
Spoke with pt who states he saw his PCP this AM (Dr Rex Kras) who did an EKG that demonstrated he was in At Fib.  States HR stayed between 130- 150 bpm for about 13 hours.  He now feels like its getting back to normal and last HR was 92.  He is on Coumadin.  He is not an antiarrythmic.  Aware I will forward this information to Dr Percival Spanish but that he should call back if HR goes above 100 bpm stays there and if he has any symptoms like SOB or chest pain.  He states understanding and is aware I will call back once Dr Percival Spanish reviews and if he wants to make any changes in medications.

## 2013-07-24 NOTE — Telephone Encounter (Signed)
New message          Pt would like to let you know that his heart is in atrial flutter. He is hard to understand. Please give him a call back.

## 2013-07-26 NOTE — Telephone Encounter (Signed)
I need to see him in the office to discuss therapy.  If he has further sustained episodes he should go to the ER.

## 2013-07-26 NOTE — Telephone Encounter (Signed)
Pt has an app 09/10/13. Can pt be given a sooner app/ would need a dbl book, please call pt to inform. Pt states he has episodes of a fast heart rate frequently but none that has lasted that long and is concerned. Pt was told to go to ED with lasting episodes/ he verbalized understanding.

## 2013-07-29 NOTE — Telephone Encounter (Signed)
Appt scheduled for 3/19 with Dr Percival Spanish  Pt is aware

## 2013-08-01 ENCOUNTER — Ambulatory Visit (INDEPENDENT_AMBULATORY_CARE_PROVIDER_SITE_OTHER): Payer: BC Managed Care – PPO | Admitting: Cardiology

## 2013-08-01 ENCOUNTER — Encounter: Payer: Self-pay | Admitting: Cardiology

## 2013-08-01 VITALS — BP 159/88 | HR 73 | Ht 74.0 in | Wt 245.0 lb

## 2013-08-01 DIAGNOSIS — I4891 Unspecified atrial fibrillation: Secondary | ICD-10-CM

## 2013-08-01 DIAGNOSIS — I10 Essential (primary) hypertension: Secondary | ICD-10-CM

## 2013-08-01 DIAGNOSIS — I480 Paroxysmal atrial fibrillation: Secondary | ICD-10-CM

## 2013-08-01 MED ORDER — DILTIAZEM HCL 30 MG PO TABS
ORAL_TABLET | ORAL | Status: DC
Start: 2013-08-01 — End: 2013-09-13

## 2013-08-01 NOTE — Patient Instructions (Signed)
Please start Diltiazem 30 mg 1 to 2 times a day. Continue all other medications as listed.  Follow up in 4 months with Dr Percival Spanish.  You will receive a letter in the mail 2 months before you are due.  Please call us when you receive this letter to schedule your follow up appointment.

## 2013-08-01 NOTE — Progress Notes (Signed)
HPI The patient presents for followup of atrial flutter and atrial fibrillation.  He is status post flutter ablation. He is off of felcainide because he had an  Abnormal perfusion study. Recently he had a day where he had 12 hours of apparent fibrillation. He's had some short paroxysms documented. He comes in today to followup on this. We have discussed possible admission for treatment with Tikosyn.   However, today he says that he would not be available for this. What is more concerning to me is he says he has decreased memory and sometimes forgets medications. I think this drug would be problematic in that situation. He's not been getting any chest pressure, neck or arm discomfort. Typically when he has palpitations only last for an hour at the most. He's not had any presyncope or syncope. He has chronic dyspnea but this is baseline.  Allergies  Allergen Reactions  . Latex Itching    Current Outpatient Prescriptions  Medication Sig Dispense Refill  . albuterol (PROVENTIL HFA;VENTOLIN HFA) 108 (90 BASE) MCG/ACT inhaler Inhale 2 puffs into the lungs every 6 (six) hours as needed for wheezing or shortness of breath.      . bisoprolol (ZEBETA) 10 MG tablet Take 10 mg by mouth every morning.       . Cholecalciferol (VITAMIN D) 2000 UNITS CAPS Take 1 capsule by mouth daily.      . diazepam (VALIUM) 5 MG tablet Take 5 mg by mouth every 6 (six) hours as needed (for muscle spasms).      [name]. etodolac (LODINE) 200 MG capsule Take 200 mg by mouth 2 (two) times daily as needed.      . furosemide (LASIX) 40 MG tablet Take 40 mg by mouth daily as needed (swelling). For swelling      . glimepiride (AMARYL) 1 MG tablet Take 1 mg by mouth daily before breakfast.       . HYDROcodone-acetaminophen (NORCO/VICODIN) 5-325 MG per tablet Take 1 tablet by mouth every 6 (six) hours as needed for pain.      . Liraglutide (VICTOZA) 18 MG/3ML SOPN Inject into the skin daily.       . metFORMIN (GLUCOPHAGE) 500 MG tablet  Take 500 mg by mouth 2 (two) times daily with a meal.       . Multiple Vitamin (MULTIVITAMIN WITH MINERALS) TABS Take 1 tablet by mouth daily.      [name]. omeprazole (PRILOSEC) 20 MG capsule Take 20 mg by mouth 2 (two) times daily.       . potassium chloride (KLOR-CON 10) 10 MEQ tablet Take 10 mEq by mouth at bedtime as needed (for leg discomfort).       . pravastatin (PRAVACHOL) 80 MG tablet Take 1 tablet (80 mg total) by mouth at bedtime.  30 tablet  6  . warfarin (COUMADIN) 5 MG tablet Take as directed by anticoagulation clinic  45 tablet  3   No current facility-administered medications for this visit.    Past Medical History  Diagnosis Date  . HLD (hyperlipidemia)     takes Pravastatin daily  . Obesity   . Tobacco abuse   . Overdose 2009    unintentional Flacanide overdose  . HTN (hypertension)     takes Prinizide daily  . Dyspnea     LHC 11/08: EF 60%, normal coronary arteries;  ETT-echo 2/11: normal at 71% PMHR  . Shortness of breath     lying/sitting/exertion  . Seasonal allergies     was on  Claritin-stopped taking this pt states it wasn't helping  . Coughing   . History of bronchitis 26yrs ago  . COPD (chronic obstructive pulmonary disease)   . Emphysema   . Peripheral edema     takes Furosemide as needed  . Hypokalemia     hx of;pt states he was taking K+ but now eats a banana daily instead  . Dizziness   . Short-term memory loss   . Arthritis     lower back  . Chronic back pain     DDD/stenosis  . Bruises easily     takes Coumadin daily  . GERD (gastroesophageal reflux disease)     takes Omeprazole bid  . Colon polyps     9 polyps removed 10/13/11  . Urinary frequency   . Urinary urgency   . Diabetes mellitus     takes Metformin and Glimepiride daily  . History of shingles   . Atrial flutter     s/p CTI ablation by Dr Rayann Heman  . Paroxysmal atrial fibrillation   . Pneumonia 3-4 yrs ago    none recent  . OSA (obstructive sleep apnea)     not always using  cpap  . Brain aneurysm 2009    questionable. A follow up CTA in 2009 showed no evidence of  . Peripheral neuropathy   . Complication of anesthesia September 11, 2012    slow to awaken after ablation    Past Surgical History  Procedure Laterality Date  . Neck surgery  45yrs ago  . Right shoulder surgery  4-17yrs ago    cyst removed  . Left inguinal hernia repair      as a child  . Tee without cardioversion  05/05/2011    Procedure: TRANSESOPHAGEAL ECHOCARDIOGRAM (TEE);  Surgeon: Lelon Perla, MD;  Location: [facility] ENDOSCOPY;  Service: Cardiovascular;  Laterality: N/A;  . Cardioversion  05/05/2011    Procedure: CARDIOVERSION;  Surgeon: Lelon Perla, MD;  Location: [facility] ENDOSCOPY;  Service: Cardiovascular;  Laterality: N/A;  . Throat surgery  4-94yrs ago    "thought " it was cancer but came back not  . Carpal tunnel release  99/2000    bilateral  . Appendectomy  2-38yrs ago  . Knee surgery  6-28yrs ago    left  . Cardiac catheterization  2008    no significant CAD  . Tonsillectomy    . Hernia repair    . Anterior cervical decomp/discectomy fusion  01/04/2012    Procedure: ANTERIOR CERVICAL DECOMPRESSION/DISCECTOMY FUSION 1 LEVEL/HARDWARE REMOVAL;  Surgeon: Ophelia Charter, MD;  Location: [place] NEURO ORS;  Service: Neurosurgery;  Laterality: N/A;  explore cervical fusion Cervical six - seven  with removal of codman plate anterior cervical decompression with fusion interbody prothesis plating and bonegraft  . Cardioversion Bilateral 07/26/2012    Procedure: CARDIOVERSION;  Surgeon: Minus Breeding, MD;  Location: [place];  Service: Cardiovascular;  Laterality: Bilateral;  . Atrial flutter ablation  09/11/2012    CTI ablation by Dr Rayann Heman  . Colonoscopy with propofol N/A 12/13/2012    Procedure: COLONOSCOPY WITH PROPOFOL;  Surgeon: Milus Banister, MD;  Location: [facility] ENDOSCOPY;  Service: Endoscopy;  Laterality: N/A;    ROS:  Burning in feet related to neuropathy and back pain.  Otherwise as  stated in the HPI and negative for all other systems.  PHYSICAL EXAM BP 159/88  Pulse 73  Ht 6\' 2"  (1.88 m)  Wt 245 lb (111.131 kg)  BMI 31.44 kg/m2 GENERAL:  Well appearing,  But slightly disheveled HEENT:  Pupils equal round and reactive, fundi not visualized, oral mucosa , edentulous NECK:  No jugular venous distention, waveform within normal limits, carotid upstroke brisk and symmetric, no bruits, no thyromegaly LYMPHATICS:  No cervical, inguinal adenopathy LUNGS:  Clear to auscultation bilaterally, diminished BS. BACK:  No CVA tenderness CHEST:  Unremarkable HEART:  PMI not displaced or sustained,S1 and S2 within normal limits, no S3, no S4, no clicks, no rubs, no murmurs, regular ABD:  Flat, positive bowel sounds normal in frequency in pitch, no bruits, no rebound, no guarding, no midline pulsatile mass, no hepatomegaly, no splenomegaly EXT:  2 plus pulses throughout, no edema, no cyanosis no clubbing SKIN:  No rashes no nodules, ruddy complexion less pronounced   EKG:   Sinus rhythm, rate 73, axis within normal limits, intervals within normal limits, nonspecific lateral T-wave inversions. 08/01/2013  ASSESSMENT AND PLAN  ATRIAL FLUTTER/FIB:  He would not be a good candidate for Tikosyn.   He was taken off of flecainide. I really don't think amiodarone would be advisable with his chronic lung disease. I'm going to give him  Cardizem to take as needed if he is having increased heart rates. Next would probably be consideration of atrial fibrillation ablation.  HTN: His BP is mildly  Elevated. However, this is not unusual in fluctuates somewhat. I will not change medications for this.  TOBACCO ABUSE:  He says his only smoking a few cigarettes per day. We have talked about the need to quit altogether  ALCOHOL USE:  He does say that he still drinks but rarely.  I would recommend  abstinence  CHEST DISCOMFORT:  He has had no new symptoms since his low risk myoview

## 2013-08-09 ENCOUNTER — Ambulatory Visit: Payer: BC Managed Care – PPO | Admitting: Cardiology

## 2013-08-29 ENCOUNTER — Ambulatory Visit (INDEPENDENT_AMBULATORY_CARE_PROVIDER_SITE_OTHER): Payer: BC Managed Care – PPO | Admitting: *Deleted

## 2013-08-29 ENCOUNTER — Telehealth: Payer: Self-pay | Admitting: Pulmonary Disease

## 2013-08-29 DIAGNOSIS — Z7901 Long term (current) use of anticoagulants: Secondary | ICD-10-CM

## 2013-08-29 DIAGNOSIS — I4891 Unspecified atrial fibrillation: Secondary | ICD-10-CM

## 2013-08-29 DIAGNOSIS — Z5181 Encounter for therapeutic drug level monitoring: Secondary | ICD-10-CM

## 2013-08-29 LAB — POCT INR: INR: 3.3

## 2013-08-29 MED ORDER — ALBUTEROL SULFATE HFA 108 (90 BASE) MCG/ACT IN AERS: 2.0000 | INHALATION_SPRAY | Freq: Four times a day (QID) | RESPIRATORY_TRACT | Status: DC | PRN

## 2013-08-29 NOTE — Telephone Encounter (Signed)
Called spoke with pt. Aware we have no samples at this time. RX has been sent. Nothing further needed

## 2013-09-03 ENCOUNTER — Telehealth: Payer: Self-pay | Admitting: *Deleted

## 2013-09-03 NOTE — Telephone Encounter (Signed)
Pt called stating he is on Loratadine and Amoxicillin and nasal spray and was concerned regarding interaction with coumadin. Instructed that these medications do not interact with coumadin and pt states understanding.

## 2013-09-04 ENCOUNTER — Telehealth: Payer: Self-pay | Admitting: *Deleted

## 2013-09-04 NOTE — Telephone Encounter (Signed)
Spoke with pt and he states had episode of fast heart  rate last night  and again this morning and states this has been occuring since saw MD in March. Called pharmacy to check Loratadine dose and it is plain Loratadine so instructed that this should not affect heart rate. Pt instructed to call MD and let him know if this continues to occur. States he is in fast heart rate but then will convert states since on Diltiazem this has helped but he understands to call MD regarding this

## 2013-09-10 ENCOUNTER — Ambulatory Visit: Payer: BC Managed Care – PPO | Admitting: Cardiology

## 2013-09-13 ENCOUNTER — Encounter: Payer: Self-pay | Admitting: Pulmonary Disease

## 2013-09-13 ENCOUNTER — Ambulatory Visit (INDEPENDENT_AMBULATORY_CARE_PROVIDER_SITE_OTHER): Payer: BC Managed Care – PPO | Admitting: Pulmonary Disease

## 2013-09-13 VITALS — BP 144/62 | HR 76 | Temp 98.0°F | Ht 75.0 in | Wt 256.0 lb

## 2013-09-13 DIAGNOSIS — J449 Chronic obstructive pulmonary disease, unspecified: Secondary | ICD-10-CM

## 2013-09-13 DIAGNOSIS — G4733 Obstructive sleep apnea (adult) (pediatric): Secondary | ICD-10-CM

## 2013-09-13 NOTE — Assessment & Plan Note (Signed)
Use albuterol as needed You have to quit smoking

## 2013-09-13 NOTE — Patient Instructions (Signed)
Use albuterol as needed You have to quit smoking Get back on CPAP once sinuses cleared - you can try a nasal mask next time

## 2013-09-13 NOTE — Progress Notes (Signed)
   Subjective:    Patient ID: [name], male    DOB: [birth year], 58 y.o.   MRN: [id number]  HPI  PCP - [name]   57/M, smoker with atrial fibrillation for FU of COPD & sleep apnea.  He had atrial fibrillation treated with flecainide in the past. He had TEE/DCCV. He is on anticoagulation.  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  He reports episodes of coughing which have made him 'pass out'. He reports sinus drainage for many years  He has severe sleep apnea -PSG from [facility] - severe with AHI 31.h corrected by CPAP 5 cm (wt was 250 lbs)  11/2011 Underwent neck sx >> given pre-op prednisone for asthmatic bronchitis -uneventful post op   12/2011 CAT 17  Spirometry fev1 68%     09/13/2013  Chief Complaint  Patient presents with  . Follow-up    reports breathing is better--started a "natural healing medication" and this has helped his breathing. No wheezing, no chest tx.  Pt has no worn CPAP x 3 weeks when he had sinus infection.    Breathing better, was sick 2 wks ago sinus infection turned into a chest cold- saw PCP, amox Uses albuterol prn Not using cpap due to sinus infection ENT has advised DNS surgery Smokes 1-2 cigs daily   Review of Systems neg for any significant sore throat, dysphagia, itching, sneezing, nasal congestion or excess/ purulent secretions, fever, chills, sweats, unintended wt loss, pleuritic or exertional cp, hempoptysis, orthopnea pnd or change in chronic leg swelling. Also denies presyncope, palpitations, heartburn, abdominal pain, nausea, vomiting, diarrhea or change in bowel or urinary habits, dysuria,hematuria, rash, arthralgias, visual complaints, headache, numbness weakness or ataxia.     Objective:   Physical Exam  Gen. Pleasant, well-nourished, in no distress ENT - no lesions, no post nasal drip, class 2 airway Neck: No JVD,  no thyromegaly, no carotid bruits Lungs: no use of accessory muscles, no dullness to percussion, clear without rales or rhonchi  Cardiovascular: Rhythm regular, heart sounds  normal, no murmurs or gallops, no peripheral edema Musculoskeletal: No deformities, no cyanosis or clubbing        Assessment & Plan:

## 2013-09-13 NOTE — Assessment & Plan Note (Signed)
Get back on CPAP once sinuses cleared - you can try a nasal mask next time  Weight loss encouraged, compliance with goal of at least 4-6 hrs every night is the expectation. Advised against medications with sedative side effects Cautioned against driving when sleepy - understanding that sleepiness will vary on a day to day basis

## 2013-09-17 ENCOUNTER — Other Ambulatory Visit: Payer: Self-pay | Admitting: *Deleted

## 2013-09-17 MED ORDER — PRAVASTATIN SODIUM 80 MG PO TABS: 80.0000 mg | ORAL_TABLET | Freq: Every day | ORAL | Status: DC

## 2013-09-23 ENCOUNTER — Telehealth: Payer: Self-pay | Admitting: Cardiology

## 2013-09-23 ENCOUNTER — Telehealth: Payer: Self-pay | Admitting: Nurse Practitioner

## 2013-09-23 NOTE — Telephone Encounter (Signed)
New message     Patient calling need clarification on direction / dosage of medication. diltiazem 60 mg

## 2013-09-23 NOTE — Telephone Encounter (Signed)
See telephone encounter from this morning where I spoke with patient regarding this issue

## 2013-09-23 NOTE — Telephone Encounter (Signed)
Spoke with patient who called with question about Diltiazem.  Patient states he was instructed at last ov in March to take Diltiazem 30 mg as needed for atrial fibrillation.  Patient states his bottle states take 1/2 tablet as needed and Dr. Percival Spanish advised patient to take a tablet as needed.  I asked patient to read prescription bottle which was filled with Diltiazem 60 mg, therefore patient needs to take 1/2 tablet as needed.  Patient states that he felt his heart go out of rhythm on Friday evening at approximately 6:00 pm and states it did not return to regular rhythm until Saturday evening at approximately 10:00 pm.  Patient questioned whether or not there was anything else that he could take.  I reviewed Dr. Rosezella Florida notes from patient's last visit and advised patient that he is due for 2 month f/u and that there are no other medications recommended by Dr. Percival Spanish.  I scheduled patient for f/u on 5/26 and patient verbalized understanding and agreement with plan of care.

## 2013-09-26 ENCOUNTER — Ambulatory Visit (INDEPENDENT_AMBULATORY_CARE_PROVIDER_SITE_OTHER): Payer: BC Managed Care – PPO | Admitting: *Deleted

## 2013-09-26 DIAGNOSIS — I4891 Unspecified atrial fibrillation: Secondary | ICD-10-CM

## 2013-09-26 DIAGNOSIS — Z7901 Long term (current) use of anticoagulants: Secondary | ICD-10-CM

## 2013-09-26 DIAGNOSIS — Z5181 Encounter for therapeutic drug level monitoring: Secondary | ICD-10-CM

## 2013-09-26 LAB — POCT INR: INR: 3.2

## 2013-09-26 MED ORDER — WARFARIN SODIUM 5 MG PO TABS
ORAL_TABLET | ORAL | Status: DC
Start: 2013-09-26 — End: 2013-10-10

## 2013-09-26 NOTE — Addendum Note (Signed)
Addended by: Margretta Sidle on: 09/26/2013 09:14 AM   Modules accepted: Orders

## 2013-10-01 ENCOUNTER — Encounter: Payer: Self-pay | Admitting: Cardiology

## 2013-10-01 ENCOUNTER — Ambulatory Visit (INDEPENDENT_AMBULATORY_CARE_PROVIDER_SITE_OTHER): Payer: BC Managed Care – PPO | Admitting: Cardiology

## 2013-10-01 VITALS — BP 161/88 | HR 81 | Ht 75.0 in | Wt 256.0 lb

## 2013-10-01 DIAGNOSIS — I10 Essential (primary) hypertension: Secondary | ICD-10-CM

## 2013-10-01 DIAGNOSIS — R55 Syncope and collapse: Secondary | ICD-10-CM

## 2013-10-01 DIAGNOSIS — I4891 Unspecified atrial fibrillation: Secondary | ICD-10-CM

## 2013-10-01 LAB — BASIC METABOLIC PANEL
BUN: 8 mg/dL (ref 6–23)
CO2: 28 mEq/L (ref 19–32)
Calcium: 9.1 mg/dL (ref 8.4–10.5)
Chloride: 105 mEq/L (ref 96–112)
Creatinine, Ser: 0.6 mg/dL (ref 0.4–1.5)
GFR: 145.4 mL/min (ref >60.00)
Glucose, Bld: 174 mg/dL — ABNORMAL HIGH (ref 70–99)
POTASSIUM: 3.7 meq/L (ref 3.5–5.1)
Sodium: 140 mEq/L (ref 135–145)

## 2013-10-01 LAB — CBC WITH DIFFERENTIAL/PLATELET
BASOS PCT: 0.3 % (ref 0.0–3.0)
Basophils Absolute: 0 10*3/uL (ref 0.0–0.1)
Eosinophils Absolute: 0.1 10*3/uL (ref 0.0–0.7)
Eosinophils Relative: 1.8 % (ref 0.0–5.0)
HCT: 41.8 % (ref 39.0–52.0)
HEMOGLOBIN: 14 g/dL (ref 13.0–17.0)
LYMPHS PCT: 25.7 % (ref 12.0–46.0)
Lymphs Abs: 1.5 10*3/uL (ref 0.7–4.0)
MCHC: 33.5 g/dL (ref 30.0–36.0)
MCV: 89.8 fl (ref 78.0–100.0)
Monocytes Absolute: 0.4 10*3/uL (ref 0.1–1.0)
Monocytes Relative: 7 % (ref 3.0–12.0)
Neutro Abs: 3.7 10*3/uL (ref 1.4–7.7)
Neutrophils Relative %: 65.2 % (ref 43.0–77.0)
Platelets: 130 10*3/uL — ABNORMAL LOW (ref 150.0–400.0)
RBC: 4.66 Mil/uL (ref 4.22–5.81)
RDW: 14.4 % (ref 11.5–15.5)
WBC: 5.7 10*3/uL (ref 4.0–10.5)

## 2013-10-01 LAB — TSH: TSH: 1.57 u[IU]/mL (ref 0.35–4.50)

## 2013-10-01 NOTE — Progress Notes (Signed)
Quick Note:  Preliminary report reviewed by triage nurse and sent to MD desk. ______ 

## 2013-10-01 NOTE — Patient Instructions (Signed)
Will obtain labs today and call you with the results (BMET/TSH/CBC)  Your physician recommends that you schedule a follow-up appointment in: DR Nome wants you to follow-up in: [place] will receive a reminder letter in the mail two months in advance. If you don't receive a letter, please call our office to schedule the follow-up appointment.

## 2013-10-01 NOTE — Progress Notes (Signed)
HPI The patient presents for followup of atrial flutter and atrial fibrillation.  He is status post flutter ablation. He is off of felcainide because he had an abnormal perfusion study.. We have discussed possible admission for treatment with Tikosyn.   However, we have decided not to do this because of decreased memory and he sometimes forgets medications. I think this drug would be problematic in that situation. He's not been getting any chest pressure, neck or arm discomfort. However, recently he had palpitations that lasted 12 hours and he thought that he was out of rhythm.  He let this pass and it did go away on its own.  However, he feels very week with this.  He reports leg weakness.  He did not have presyncope or syncope.    Allergies  Allergen Reactions  . Latex Itching    Current Outpatient Prescriptions  Medication Sig Dispense Refill  . albuterol (PROVENTIL HFA;VENTOLIN HFA) 108 (90 BASE) MCG/ACT inhaler Inhale 2 puffs into the lungs every 6 (six) hours as needed for wheezing or shortness of breath.  1 Inhaler  3  . bisoprolol (ZEBETA) 10 MG tablet Take 10 mg by mouth every morning.       . Cholecalciferol (VITAMIN D) 2000 UNITS CAPS Take 1 capsule by mouth daily.      [name] diltiazem (CARDIZEM) 30 MG tablet 1/2 tab  2 times a day if needed      . furosemide (LASIX) 40 MG tablet Take 40 mg by mouth daily as needed (swelling). For swelling      . glimepiride (AMARYL) 1 MG tablet Take 1 mg by mouth daily before breakfast.       . HYDROcodone-acetaminophen (NORCO) 10-325 MG per tablet Take 1 tablet by mouth every 6 (six) hours as needed.      [name] HYDROcodone-acetaminophen (NORCO/VICODIN) 5-325 MG per tablet Take 1 tablet by mouth every 6 (six) hours as needed for pain.      . Liraglutide (VICTOZA) 18 MG/3ML SOPN Inject into the skin daily.       . metFORMIN (GLUCOPHAGE) 500 MG tablet Take 500 mg by mouth 2 (two) times daily with a meal.       . Multiple Vitamin (MULTIVITAMIN WITH MINERALS)  TABS Take 1 tablet by mouth daily.      [name] omeprazole (PRILOSEC) 20 MG capsule Take 20 mg by mouth 2 (two) times daily.       . potassium chloride (KLOR-CON 10) 10 MEQ tablet Take 10 mEq by mouth at bedtime as needed (for leg discomfort).       . pravastatin (PRAVACHOL) 80 MG tablet Take 1 tablet (80 mg total) by mouth at bedtime.  30 tablet  1  . warfarin (COUMADIN) 5 MG tablet Take as directed by anticoagulation clinic  45 tablet  3   No current facility-administered medications for this visit.    Past Medical History  Diagnosis Date  . HLD (hyperlipidemia)     takes Pravastatin daily  . Obesity   . Tobacco abuse   . Overdose 2009    unintentional Flacanide overdose  . HTN (hypertension)     takes Prinizide daily  . Dyspnea     LHC 11/08: EF 60%, normal coronary arteries;  ETT-echo 2/11: normal at 71% PMHR  . Shortness of breath     lying/sitting/exertion  . Seasonal allergies     was on Claritin-stopped taking this pt states it wasn't helping  . Coughing   . History of bronchitis  38yrs ago  . COPD (chronic obstructive pulmonary disease)   . Emphysema   . Peripheral edema     takes Furosemide as needed  . Hypokalemia     hx of;pt states he was taking K+ but now eats a banana daily instead  . Dizziness   . Short-term memory loss   . Arthritis     lower back  . Chronic back pain     DDD/stenosis  . Bruises easily     takes Coumadin daily  . GERD (gastroesophageal reflux disease)     takes Omeprazole bid  . Colon polyps     9 polyps removed 10/13/11  . Urinary frequency   . Urinary urgency   . Diabetes mellitus     takes Metformin and Glimepiride daily  . History of shingles   . Atrial flutter     s/p CTI ablation by Dr Rayann Heman  . Paroxysmal atrial fibrillation   . Pneumonia 3-4 yrs ago    none recent  . OSA (obstructive sleep apnea)     not always using cpap  . Brain aneurysm 2009    questionable. A follow up CTA in 2009 showed no evidence of  . Peripheral  neuropathy   . Complication of anesthesia September 11, 2012    slow to awaken after ablation    Past Surgical History  Procedure Laterality Date  . Neck surgery  58yrs ago  . Right shoulder surgery  4-68yrs ago    cyst removed  . Left inguinal hernia repair      as a child  . Tee without cardioversion  05/05/2011    Procedure: TRANSESOPHAGEAL ECHOCARDIOGRAM (TEE);  Surgeon: Lelon Perla, MD;  Location: [facility] ENDOSCOPY;  Service: Cardiovascular;  Laterality: N/A;  . Cardioversion  05/05/2011    Procedure: CARDIOVERSION;  Surgeon: Lelon Perla, MD;  Location[facility] ENDOSCOPY;  Service: Cardiovascular;  Laterality: N/A;  . Throat surgery  4-40yrs ago    "thought " it was cancer but came back not  . Carpal tunnel release  99/2000    bilateral  . Appendectomy  2-50yrs ago  . Knee surgery  6-85yrs ago    left  . Cardiac catheterization  2008    no significant CAD  . Tonsillectomy    . Hernia repair    . Anterior cervical decomp/discectomy fusion  01/04/2012    Procedure: ANTERIOR CERVICAL DECOMPRESSION/DISCECTOMY FUSION 1 LEVEL/HARDWARE REMOVAL;  Surgeon: Ophelia Charter, MD;  Location: [facility]URO ORS;  Service: Neurosurgery;  Laterality: N/A;  explore cervical fusion Cervical six - seven  with removal of codman plate anterior cervical decompression with fusion interbody prothesis plating and bonegraft  . Cardioversion Bilateral 07/26/2012    Procedure: CARDIOVERSION;  Surgeon: Minus Breeding, MD;  Location: [place];  Service: Cardiovascular;  Laterality: Bilateral;  . Atrial flutter ablation  09/11/2012    CTI ablation by Dr Rayann Heman  . Colonoscopy with propofol N/A 12/13/2012    Procedure: COLONOSCOPY WITH PROPOFOL;  Surgeon: Milus Banister, MD;  Location: [facility] ENDOSCOPY;  Service: Endoscopy;  Laterality: N/A;    ROS:  Burning in feet related to neuropathy and back pain.  Otherwise as stated in the HPI and negative for all other systems.  PHYSICAL EXAM BP 161/88  Pulse 81  Ht 6\' 3"   (1.905 m)  Wt 256 lb (116.121 kg)  BMI 32.00 kg/m2 GENERAL:  Well appearing,  But slightly disheveled HEENT:  Pupils equal round and reactive, fundi not visualized, oral mucosa ,  edentulous NECK:  No jugular venous distention, waveform within normal limits, carotid upstroke brisk and symmetric, no bruits, no thyromegaly LYMPHATICS:  No cervical, inguinal adenopathy LUNGS:  Clear to auscultation bilaterally, diminished BS. BACK:  No CVA tenderness CHEST:  Unremarkable HEART:  PMI not displaced or sustained,S1 and S2 within normal limits, no S3, no S4, no clicks, no rubs, no murmurs, regular ABD:  Flat, positive bowel sounds normal in frequency in pitch, no bruits, no rebound, no guarding, no midline pulsatile mass, no hepatomegaly, no splenomegaly EXT:  2 plus pulses throughout, no edema, no cyanosis no clubbing SKIN:  No rashes no nodules, ruddy complexion unchanged.   ASSESSMENT AND PLAN  ATRIAL FLUTTER/FIB:  He would not be a good candidate for Tikosyn with his memory problems.   He was taken off of flecainide. I really don't think amiodarone would be advisable with his chronic lung disease. I will send him back to Dr. Rayann Heman to further consider ablation.  Because of ongoing weakness I will check a labs to include CBC and TSH.   HTN: His BP is mildly  Elevated. However, this is not unusual in fluctuates somewhat. I will not change medications for this.  TOBACCO ABUSE:  He says his only smoking a few cigarettes per day. We have talked about the need to quit altogether  ALCOHOL USE:  He says that he has not had a drink in months.  At other times he has said that he drinks rarely.  I would recommend  abstinence  CHEST DISCOMFORT:  He has had no new symptoms since his low risk myoview

## 2013-10-08 ENCOUNTER — Ambulatory Visit: Payer: BC Managed Care – PPO | Admitting: Cardiology

## 2013-10-10 ENCOUNTER — Emergency Department (HOSPITAL_COMMUNITY): Payer: BC Managed Care – PPO

## 2013-10-10 ENCOUNTER — Emergency Department (HOSPITAL_COMMUNITY)
Admission: EM | Admit: 2013-10-10 | Discharge: 2013-10-10 | Disposition: A | Discharge status: Home / Self Care | Payer: BC Managed Care – PPO | Attending: Emergency Medicine | Admitting: Emergency Medicine

## 2013-10-10 ENCOUNTER — Encounter (HOSPITAL_COMMUNITY): Payer: Self-pay | Admitting: Emergency Medicine

## 2013-10-10 DIAGNOSIS — F17200 Nicotine dependence, unspecified, uncomplicated: Secondary | ICD-10-CM | POA: Insufficient documentation

## 2013-10-10 DIAGNOSIS — E119 Type 2 diabetes mellitus without complications: Secondary | ICD-10-CM | POA: Insufficient documentation

## 2013-10-10 DIAGNOSIS — Z79899 Other long term (current) drug therapy: Secondary | ICD-10-CM | POA: Insufficient documentation

## 2013-10-10 DIAGNOSIS — K219 Gastro-esophageal reflux disease without esophagitis: Secondary | ICD-10-CM | POA: Insufficient documentation

## 2013-10-10 DIAGNOSIS — Z860100 Personal history of colon polyps, unspecified: Secondary | ICD-10-CM | POA: Insufficient documentation

## 2013-10-10 DIAGNOSIS — E669 Obesity, unspecified: Secondary | ICD-10-CM | POA: Insufficient documentation

## 2013-10-10 DIAGNOSIS — Z8739 Personal history of other diseases of the musculoskeletal system and connective tissue: Secondary | ICD-10-CM | POA: Insufficient documentation

## 2013-10-10 DIAGNOSIS — G8929 Other chronic pain: Secondary | ICD-10-CM | POA: Insufficient documentation

## 2013-10-10 DIAGNOSIS — Z8701 Personal history of pneumonia (recurrent): Secondary | ICD-10-CM | POA: Insufficient documentation

## 2013-10-10 DIAGNOSIS — J438 Other emphysema: Secondary | ICD-10-CM | POA: Insufficient documentation

## 2013-10-10 DIAGNOSIS — I4891 Unspecified atrial fibrillation: Secondary | ICD-10-CM

## 2013-10-10 DIAGNOSIS — R000 Tachycardia, unspecified: Secondary | ICD-10-CM | POA: Insufficient documentation

## 2013-10-10 DIAGNOSIS — R110 Nausea: Secondary | ICD-10-CM | POA: Insufficient documentation

## 2013-10-10 DIAGNOSIS — Z8619 Personal history of other infectious and parasitic diseases: Secondary | ICD-10-CM | POA: Insufficient documentation

## 2013-10-10 DIAGNOSIS — Z8669 Personal history of other diseases of the nervous system and sense organs: Secondary | ICD-10-CM | POA: Insufficient documentation

## 2013-10-10 DIAGNOSIS — I10 Essential (primary) hypertension: Secondary | ICD-10-CM | POA: Insufficient documentation

## 2013-10-10 DIAGNOSIS — E785 Hyperlipidemia, unspecified: Secondary | ICD-10-CM | POA: Insufficient documentation

## 2013-10-10 DIAGNOSIS — Z91040 Latex allergy status: Secondary | ICD-10-CM | POA: Insufficient documentation

## 2013-10-10 DIAGNOSIS — Z7901 Long term (current) use of anticoagulants: Secondary | ICD-10-CM | POA: Insufficient documentation

## 2013-10-10 LAB — COMPREHENSIVE METABOLIC PANEL
ALT: 34 U/L (ref 0–53)
AST: 34 U/L (ref 0–37)
Albumin: 4 g/dL (ref 3.5–5.2)
Alkaline Phosphatase: 175 U/L — ABNORMAL HIGH (ref 39–117)
BUN: 14 mg/dL (ref 6–23)
CALCIUM: 9.9 mg/dL (ref 8.4–10.5)
CO2: 18 mEq/L — ABNORMAL LOW (ref 19–32)
CREATININE: 0.81 mg/dL (ref 0.50–1.35)
Chloride: 104 mEq/L (ref 96–112)
GLUCOSE: 197 mg/dL — AB (ref 70–99)
Potassium: 3.7 mEq/L (ref 3.7–5.3)
Sodium: 140 mEq/L (ref 137–147)
Total Bilirubin: 0.3 mg/dL (ref 0.3–1.2)
Total Protein: 7.4 g/dL (ref 6.0–8.3)

## 2013-10-10 LAB — CBC WITH DIFFERENTIAL/PLATELET
Basophils Absolute: 0.1 10*3/uL (ref 0.0–0.1)
Basophils Relative: 1 % (ref 0–1)
Eosinophils Absolute: 0.2 10*3/uL (ref 0.0–0.7)
Eosinophils Relative: 2 % (ref 0–5)
HCT: 46.6 % (ref 39.0–52.0)
HEMOGLOBIN: 15.6 g/dL (ref 13.0–17.0)
LYMPHS ABS: 2.2 10*3/uL (ref 0.7–4.0)
Lymphocytes Relative: 23 % (ref 12–46)
MCH: 29.8 pg (ref 26.0–34.0)
MCHC: 33.5 g/dL (ref 30.0–36.0)
MCV: 88.9 fL (ref 78.0–100.0)
MONOS PCT: 9 % (ref 3–12)
Monocytes Absolute: 0.8 10*3/uL (ref 0.1–1.0)
NEUTROS PCT: 65 % (ref 43–77)
Neutro Abs: 6.4 10*3/uL (ref 1.7–7.7)
Platelets: 173 10*3/uL (ref 150–400)
RBC: 5.24 MIL/uL (ref 4.22–5.81)
RDW: 14.6 % (ref 11.5–15.5)
WBC: 9.7 10*3/uL (ref 4.0–10.5)

## 2013-10-10 LAB — PROTIME-INR
INR: 2.68 — AB (ref 0.00–1.49)
Prothrombin Time: 27.6 seconds — ABNORMAL HIGH (ref 11.6–15.2)

## 2013-10-10 LAB — I-STAT TROPONIN, ED: TROPONIN I, POC: 0.01 ng/mL (ref 0.00–0.08)

## 2013-10-10 LAB — PRO B NATRIURETIC PEPTIDE: PRO B NATRI PEPTIDE: 50.1 pg/mL (ref 0–125)

## 2013-10-10 MED ORDER — SODIUM CHLORIDE 0.9 % IV BOLUS (SEPSIS)
1000.0000 mL | Freq: Once | INTRAVENOUS | Status: AC
  Administered 2013-10-10: 1000 mL via INTRAVENOUS

## 2013-10-10 NOTE — ED Notes (Signed)
C/o heart fluttering, h/o afib, afib in triage on monitor, HR irregular 120-145, c/o sob, dizzy, weak. Denies pain. Reports recent GI virus, lost 18lbs in 3d. Took 1/2 a diltiazem PTA. NKDA. (denies: pain, or LOC), pt of Dr. Percival Spanish. Onset 1715. Progression fluctuating.

## 2013-10-10 NOTE — Discharge Instructions (Signed)
Please followup with your cardiologist and make a phone call for an appointment tomorrow if your heart rate continues to be over 100 beats per minute. Atrial Fibrillation Atrial fibrillation is a type of irregular heart rhythm (arrhythmia). During atrial fibrillation, the upper chambers of the heart (atria) quiver continuously in a chaotic pattern. This causes an irregular and often rapid heart rate.  Atrial fibrillation is the result of the heart becoming overloaded with disorganized signals that tell it to beat. These signals are normally released one at a time by a part of the right atrium called the sinoatrial node. They then travel from the atria to the lower chambers of the heart (ventricles), causing the atria and ventricles to contract and pump blood as they pass. In atrial fibrillation, parts of the atria outside of the sinoatrial node also release these signals. This results in two problems. First, the atria receive so many signals that they do not have time to fully contract. Second, the ventricles, which can only receive one signal at a time, beat irregularly and out of rhythm with the atria.  There are three types of atrial fibrillation:   Paroxysmal Paroxysmal atrial fibrillation starts suddenly and stops on its own within a week.   Persistent Persistent atrial fibrillation lasts for more than a week. It may stop on its own or with treatment.   Permanent Permanent atrial fibrillation does not go away. Episodes of atrial fibrillation may lead to permanent atrial fibrillation.  Atrial fibrillation can prevent your heart from pumping blood normally. It increases your risk of stroke and can lead to heart failure.  CAUSES   Heart conditions, including a heart attack, heart failure, coronary artery disease, and heart valve conditions.   Inflammation of the sac that surrounds the heart (pericarditis).   Blockage of an artery in the lungs (pulmonary embolism).   Pneumonia or other  infections.   Chronic lung disease.   Thyroid problems, especially if the thyroid is overactive (hyperthyroidism).   Caffeine, excessive alcohol use, and use of some illegal drugs.   Use of some medications, including certain decongestants and diet pills.   Heart surgery.   Birth defects.  Sometimes, no cause can be found. When this happens, the atrial fibrillation is called lone atrial fibrillation. The risk of complications from atrial fibrillation increases if you have lone atrial fibrillation and you are age 56 years or older. RISK FACTORS  Heart failure.  Coronary artery disease  Diabetes mellitus.   High blood pressure (hypertension).   Obesity.   Other arrhythmias.   Increased age. SYMPTOMS   A feeling that your heart is beating rapidly or irregularly.   A feeling of discomfort or pain in your chest.   Shortness of breath.   Sudden lightheadedness or weakness.   Getting tired easily when exercising.   Urinating more often than normal (mainly when atrial fibrillation first begins).  In paroxysmal atrial fibrillation, symptoms may start and suddenly stop. DIAGNOSIS  Your caregiver may be able to detect atrial fibrillation when taking your pulse. Usually, testing is needed to diagnosis atrial fibrillation. Tests may include:   Electrocardiography. During this test, the electrical impulses of your heart are recorded while you are lying down.   Echocardiography. During echocardiography, sound waves are used to evaluate how blood flows through your heart.   Stress test. There is more than one type of stress test. If a stress test is needed, ask your caregiver about which type is best for you.   Chest  X-ray exam.   Blood tests.   Computed tomography (CT).  TREATMENT   Treating any underlying conditions. For example, if you have an overactive thyroid, treating the condition may correct atrial fibrillation.   Medication. Medications  may be given to control a rapid heart rate or to prevent blood clots, heart failure, or a stroke.   Procedure to correct the rhythm of the heart:  Electrical cardioversion. During electrical cardioversion, a controlled, low-energy shock is delivered to the heart through your skin. If you have chest pain, very low pressure blood pressure, or sudden heart failure, this procedure may need to be done as an emergency.  Catheter ablation. During this procedure, heart tissues that send the signals that cause atrial fibrillation are destroyed.  Maze or minimaze procedure. During this surgery, thin lines of heart tissue that carry the abnormal signals are destroyed. The maze procedure is an open-heart surgery. The minimaze procedure is a minimally invasive surgery. This means that small cuts are made to access the heart instead of a large opening.  Pulmonary venous isolation. During this surgery, tissue around the veins that carry blood from the lungs (pulmonary veins) is destroyed. This tissue is thought to carry the abnormal signals. HOME CARE INSTRUCTIONS   Take medications as directed by your caregiver.  Only take medications that your caregiver approves. Some medications can make atrial fibrillation worse or recur.  If blood thinners were prescribed by your caregiver, take them exactly as directed. Too much can cause bleeding. Too little and you will not have the needed protection against stroke and other problems.  Perform blood tests at home if directed by your caregiver.  Perform blood tests exactly as directed.   Quit smoking if you smoke.   Do not drink alcohol.   Do not drink caffeinated beverages such as coffee, soda, and some teas. You may drink decaffeinated coffee, soda, or tea.   Maintain a healthy weight. Do not use diet pills unless your caregiver approves. They may make heart problems worse.   Follow diet instructions as directed by your caregiver.   Exercise  regularly as directed by your caregiver.   Keep all follow-up appointments. PREVENTION  The following substances can cause atrial fibrillation to recur:   Caffeinated beverages.   Alcohol.   Certain medications, especially those used for breathing problems.   Certain herbs and herbal medications, such as those containing ephedra or ginseng.  Illegal drugs such as cocaine and amphetamines. Sometimes medications are given to prevent atrial fibrillation from recurring. Proper treatment of any underlying condition is also important in helping prevent recurrence.  SEEK MEDICAL CARE IF:  You notice a change in the rate, rhythm, or strength of your heartbeat.   You suddenly begin urinating more frequently.   You tire more easily when exerting yourself or exercising.  SEEK IMMEDIATE MEDICAL CARE IF:   You develop chest pain, abdominal pain, sweating, or weakness.  You feel sick to your stomach (nauseous).  You develop shortness of breath.  You suddenly develop swollen feet and ankles.  You feel dizzy.  You face or limbs feel numb or weak.  There is a change in your vision or speech. MAKE SURE YOU:   Understand these instructions.  Will watch your condition.  Will get help right away if you are not doing well or get worse. Document Released: 05/02/2005 Document Revised: 08/27/2012 Document Reviewed: 06/12/2012 [facility] Patient Information 2014 [place].

## 2013-10-10 NOTE — ED Provider Notes (Signed)
CSN: [id number]     Arrival date & time 10/10/13  1930 History   First MD Initiated Contact with Patient 10/10/13 1954     Chief Complaint  Patient presents with  . Atrial Fibrillation     (Consider location/radiation/quality/duration/timing/severity/associated sxs/prior Treatment) Patient is a 58 y.o. male presenting with palpitations. The history is provided by the patient.  Palpitations Palpitations quality:  Fast Onset quality:  Sudden Duration:  4 hours Timing:  Constant Progression:  Waxing and waning Chronicity:  Recurrent Context comment:  A week long diarrheal illness preceding Relieved by: Diltiazem. Worsened by:  Nothing tried Ineffective treatments:  None tried Associated symptoms: nausea   Associated symptoms: no chest pain, no chest pressure, no diaphoresis, no dizziness, no lower extremity edema, no vomiting and no weakness   Risk factors: hx of atrial fibrillation     Past Medical History  Diagnosis Date  . HLD (hyperlipidemia)     takes Pravastatin daily  . Obesity   . Tobacco abuse   . Overdose 2009    unintentional Flacanide overdose  . HTN (hypertension)     takes Prinizide daily  . Dyspnea     LHC 11/08: EF 60%, normal coronary arteries;  ETT-echo 2/11: normal at 71% PMHR  . Shortness of breath     lying/sitting/exertion  . Seasonal allergies     was on Claritin-stopped taking this pt states it wasn't helping  . Coughing   . History of bronchitis 56yrs ago  . COPD (chronic obstructive pulmonary disease)   . Emphysema   . Peripheral edema     takes Furosemide as needed  . Hypokalemia     hx of;pt states he was taking K+ but now eats a banana daily instead  . Dizziness   . Short-term memory loss   . Arthritis     lower back  . Chronic back pain     DDD/stenosis  . Bruises easily     takes Coumadin daily  . GERD (gastroesophageal reflux disease)     takes Omeprazole bid  . Colon polyps     9 polyps removed 10/13/11  . Urinary frequency    . Urinary urgency   . Diabetes mellitus     takes Metformin and Glimepiride daily  . History of shingles   . Atrial flutter     s/p CTI ablation by Dr Rayann Heman  . Paroxysmal atrial fibrillation   . Pneumonia 3-4 yrs ago    none recent  . OSA (obstructive sleep apnea)     not always using cpap  . Brain aneurysm 2009    questionable. A follow up CTA in 2009 showed no evidence of  . Peripheral neuropathy   . Complication of anesthesia September 11, 2012    slow to awaken after ablation   Past Surgical History  Procedure Laterality Date  . Neck surgery  24yrs ago  . Right shoulder surgery  4-21yrs ago    cyst removed  . Left inguinal hernia repair      as a child  . Tee without cardioversion  05/05/2011    Procedure: TRANSESOPHAGEAL ECHOCARDIOGRAM (TEE);  Surgeon: Lelon Perla, MD;  Location: [facility] ENDOSCOPY;  Service: Cardiovascular;  Laterality: N/A;  . Cardioversion  05/05/2011    Procedure: CARDIOVERSION;  Surgeon: Lelon Perla, MD;  Location: [facility] ENDOSCOPY;  Service: Cardiovascular;  Laterality: N/A;  . Throat surgery  4-72yrs ago    "thought " it was cancer but came back not  . Carpal  tunnel release  99/2000    bilateral  . Appendectomy  2-79yrs ago  . Knee surgery  6-24yrs ago    left  . Cardiac catheterization  2008    no significant CAD  . Tonsillectomy    . Hernia repair    . Anterior cervical decomp/discectomy fusion  01/04/2012    Procedure: ANTERIOR CERVICAL DECOMPRESSION/DISCECTOMY FUSION 1 LEVEL/HARDWARE REMOVAL;  Surgeon: Ophelia Charter, MD;  Location: [place] NEURO ORS;  Service: Neurosurgery;  Laterality: N/A;  explore cervical fusion Cervical six - seven  with removal of codman plate anterior cervical decompression with fusion interbody prothesis plating and bonegraft  . Cardioversion Bilateral 07/26/2012    Procedure: CARDIOVERSION;  Surgeon: Minus Breeding, MD;  Location: [place];  Service: Cardiovascular;  Laterality: Bilateral;  . Atrial flutter ablation   09/11/2012    CTI ablation by Dr Rayann Heman  . Colonoscopy with propofol N/A 12/13/2012    Procedure: COLONOSCOPY WITH PROPOFOL;  Surgeon: Milus Banister, MD;  Location: [facility] ENDOSCOPY;  Service: Endoscopy;  Laterality: N/A;   Family History  Problem Relation Age of Onset  . Colon cancer Neg Hx   . Anesthesia problems Neg Hx   . Hypotension Neg Hx   . Malignant hyperthermia Neg Hx   . Pseudochol deficiency Neg Hx    History  Substance Use Topics  . Smoking status: Current Every Day Smoker -- 0.30 packs/day for 40 years    Types: Cigarettes  . Smokeless tobacco: Never Used     Comment: cessation advised,  he is not ready to quit  . Alcohol Use: 0.0 oz/week     Comment: couple of times a month liquor, he tells me he typically drinks 2-4 drinks most days but hasnt drank anything in 6 weeks    Review of Systems  Constitutional: Negative for diaphoresis.  Cardiovascular: Positive for palpitations. Negative for chest pain.  Gastrointestinal: Positive for nausea. Negative for vomiting.  Neurological: Negative for dizziness.  All other systems reviewed and are negative.     Allergies  Latex  Home Medications   Prior to Admission medications   Medication Sig Start Date End Date Taking? Authorizing Provider  albuterol (PROVENTIL HFA;VENTOLIN HFA) 108 (90 BASE) MCG/ACT inhaler Inhale 2 puffs into the lungs every 6 (six) hours as needed for wheezing or shortness of breath. 08/29/13   Rigoberto Noel, MD  bisoprolol (ZEBETA) 10 MG tablet Take 10 mg by mouth every morning.     Historical Provider, MD  Cholecalciferol (VITAMIN D) 2000 UNITS CAPS Take 1 capsule by mouth daily.    Historical Provider, MD  diltiazem (CARDIZEM) 30 MG tablet 1/2 tab  2 times a day if needed 08/01/13   Minus Breeding, MD  furosemide (LASIX) 40 MG tablet Take 40 mg by mouth daily as needed (swelling). For swelling    Historical Provider, MD  glimepiride (AMARYL) 1 MG tablet Take 1 mg by mouth daily before breakfast.      Historical Provider, MD  HYDROcodone-acetaminophen (NORCO) 10-325 MG per tablet Take 1 tablet by mouth every 6 (six) hours as needed.    Historical Provider, MD  HYDROcodone-acetaminophen (NORCO/VICODIN) 5-325 MG per tablet Take 1 tablet by mouth every 6 (six) hours as needed for pain.    Historical Provider, MD  Liraglutide (VICTOZA) 18 MG/3ML SOPN Inject into the skin daily.     Historical Provider, MD  metFORMIN (GLUCOPHAGE) 500 MG tablet Take 500 mg by mouth 2 (two) times daily with a meal.  Historical Provider, MD  Multiple Vitamin (MULTIVITAMIN WITH MINERALS) TABS Take 1 tablet by mouth daily.    Historical Provider, MD  omeprazole (PRILOSEC) 20 MG capsule Take 20 mg by mouth 2 (two) times daily.     Historical Provider, MD  potassium chloride (KLOR-CON 10) 10 MEQ tablet Take 10 mEq by mouth at bedtime as needed (for leg discomfort).     Historical Provider, MD  pravastatin (PRAVACHOL) 80 MG tablet Take 1 tablet (80 mg total) by mouth at bedtime. 09/17/13   Minus Breeding, MD  warfarin (COUMADIN) 5 MG tablet Take as directed by anticoagulation clinic 09/26/13   Minus Breeding, MD   BP 146/90  Pulse 112  Temp(Src) 98.6 F (37 C) (Oral)  Resp 16  Ht 6\' 3"  (1.905 m)  Wt 242 lb (109.77 kg)  BMI 30.25 kg/m2  SpO2 97% Physical Exam  Constitutional: He is oriented to person, place, and time. He appears well-developed and well-nourished. No distress.  HENT:  Head: Normocephalic and atraumatic.  Eyes: Conjunctivae are normal.  Neck: Neck supple. No tracheal deviation present.  Cardiovascular: S1 normal and S2 normal.  An irregularly irregular rhythm present.  No extrasystoles are present. Tachycardia present.   Pulmonary/Chest: Effort normal. No accessory muscle usage. Not tachypneic. No respiratory distress. He has no decreased breath sounds. He has no wheezes.  Abdominal: Soft. He exhibits no distension. There is no tenderness.  Neurological: He is alert and oriented to person, place, and  time.  Skin: Skin is warm and dry.  Psychiatric: He has a normal mood and affect.    ED Course  Procedures (including critical care time) Labs Review Labs Reviewed  PROTIME-INR - Abnormal; Notable for the following:    Prothrombin Time 27.6 (*)    INR 2.68 (*)    All other components within normal limits  COMPREHENSIVE METABOLIC PANEL - Abnormal; Notable for the following:    CO2 18 (*)    Glucose, Bld 197 (*)    Alkaline Phosphatase 175 (*)    All other components within normal limits  PRO B NATRIURETIC PEPTIDE  CBC WITH DIFFERENTIAL  Randolm Idol, ED    Imaging Review Dg Chest Port 1 View  10/10/2013   CLINICAL DATA:  Atrial fibrillation  EXAM: PORTABLE CHEST - 1 VIEW  COMPARISON:  09/13/2012  FINDINGS: Cardiomediastinal silhouette is stable. No acute infiltrate or pleural effusion. No pulmonary edema. Bony thorax is unremarkable.  IMPRESSION: No active disease.   Electronically Signed   By: Lahoma Crocker M.D.   On: 10/10/2013 20:33     EKG Interpretation None      MDM   Final diagnoses:  Atrial fibrillation with RVR   58 y.o. male presents with recurrent atrial fibrillation that started 3 hours prior to arrival. He states that he intermittently goes in and out of A. fib and typically it resolves on its own without intervention. He does have when necessary diltiazem (15 mg) at home and he took a half a tablet prior to coming here. He states at home that his heart rate was around 180 and on arrival here is between 130-140. He is currently anticoagulated on Coumadin but does not know what his current INR level is as he had a recent change in his dosing schedule.  The patient has a therapeutic INR, his rate improved to just above 100 after 1 L of normal saline. The patient does appear clinically dehydrated and admits to having a diarrheal illness over the past week prior  to onset of the symptoms. A second liter was ordered and cardiology was called to discuss the case. They  recommended that the patient take a full tablet of 30 mg should he have recurrence of symptoms later on and that he should followup with their office in the morning if continuing to be tachycardic. The patient agreed with this plan, is hemodynamically stable for discharge and feels better after the fluid boluses.  Leo Grosser, MD 10/11/13 [phone]

## 2013-10-11 ENCOUNTER — Ambulatory Visit (INDEPENDENT_AMBULATORY_CARE_PROVIDER_SITE_OTHER): Payer: BC Managed Care – PPO | Admitting: Pharmacist

## 2013-10-11 DIAGNOSIS — Z5181 Encounter for therapeutic drug level monitoring: Secondary | ICD-10-CM

## 2013-10-11 DIAGNOSIS — Z7901 Long term (current) use of anticoagulants: Secondary | ICD-10-CM

## 2013-10-11 DIAGNOSIS — I4891 Unspecified atrial fibrillation: Secondary | ICD-10-CM

## 2013-10-11 LAB — POCT INR: INR: 3.6

## 2013-10-11 NOTE — ED Provider Notes (Signed)
I saw and evaluated the patient, reviewed the resident's note and I agree with the findings and plan.   EKG Interpretation None       Date: 10/10/2013  Rate: 140  Rhythm: atrial fibrillation  QRS Axis: normal  Intervals: normal  ST/T Wave abnormalities: nonspecific ST changes  Conduction Disutrbances:none  Narrative Interpretation:   Old EKG Reviewed: unchanged    [name] is a 58 y.o. male hx of afib on coumadin here with palpitations. Palpitations today, felt weak and dizziness no chest pain. Took cardizem 15 mg today and felt slightly better. Also some diarrhea as well. On exam, tachy around 140s, irregular. Lungs clear. Given 2L NS and rate improved to the mid 90s. No signs of heart failure. Cardiology consulted and recommend increase cardizem to 30 mg prn palpitations. He has cardiology f/u outpatient.    Wandra Arthurs, MD 10/11/13 1054

## 2013-10-18 ENCOUNTER — Encounter: Payer: Self-pay | Admitting: Internal Medicine

## 2013-10-18 ENCOUNTER — Ambulatory Visit (INDEPENDENT_AMBULATORY_CARE_PROVIDER_SITE_OTHER): Payer: BC Managed Care – PPO

## 2013-10-18 ENCOUNTER — Ambulatory Visit (INDEPENDENT_AMBULATORY_CARE_PROVIDER_SITE_OTHER): Payer: BC Managed Care – PPO | Admitting: Internal Medicine

## 2013-10-18 VITALS — BP 163/89 | HR 72 | Ht 75.0 in | Wt 256.8 lb

## 2013-10-18 DIAGNOSIS — Z5181 Encounter for therapeutic drug level monitoring: Secondary | ICD-10-CM

## 2013-10-18 DIAGNOSIS — I4891 Unspecified atrial fibrillation: Secondary | ICD-10-CM

## 2013-10-18 DIAGNOSIS — G4733 Obstructive sleep apnea (adult) (pediatric): Secondary | ICD-10-CM

## 2013-10-18 DIAGNOSIS — Z720 Tobacco use: Secondary | ICD-10-CM

## 2013-10-18 DIAGNOSIS — Z7901 Long term (current) use of anticoagulants: Secondary | ICD-10-CM

## 2013-10-18 DIAGNOSIS — F17200 Nicotine dependence, unspecified, uncomplicated: Secondary | ICD-10-CM

## 2013-10-18 LAB — POCT INR: INR: 2.9

## 2013-10-18 MED ORDER — DRONEDARONE HCL 400 MG PO TABS: 400.0000 mg | ORAL_TABLET | Freq: Two times a day (BID) | ORAL | Status: DC

## 2013-10-18 NOTE — Patient Instructions (Signed)
Your physician recommends that you schedule a follow-up appointment in: 3 months with Dr Rayann Heman    Your physician has requested that you have an echocardiogram. Echocardiography is a painless test that uses sound waves to create images of your heart. It provides your doctor with information about the size and shape of your heart and how well your heart's chambers and valves are working. This procedure takes approximately one hour. There are no restrictions for this procedure.   Your physician has recommended you make the following change in your medication:  1) Start Multaq 400mg  twice daily

## 2013-10-20 ENCOUNTER — Encounter: Payer: Self-pay | Admitting: Internal Medicine

## 2013-10-20 NOTE — Progress Notes (Signed)
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  He continues to have occasional afib.  He has been taken off of flecainide due to an abnormal perfusion study.  He seems to be doing reasonably well from a CV standpoint but has lots of other issues.  Today, he reports frequent leg weakness, leg tingling, and a concern of unsteadiness.  He also reports that he has had difficulty with his memory.  He continues to follow with primary care for these and other issues.  Today, he denies symptoms of chest pain, shortness of breath, or  lower extremity edema.  He continues to smoke.  He has tingling in all of his fingers but does not feel that this could be due to tobacco.  The patient is otherwise without complaint today.   Past Medical History  Diagnosis Date  . HLD (hyperlipidemia)     takes Pravastatin daily  . Obesity   . Tobacco abuse   . Overdose 2009    unintentional Flacanide overdose  . HTN (hypertension)     takes Prinizide daily  . Dyspnea     LHC 11/08: EF 60%, normal coronary arteries;  ETT-echo 2/11: normal at 71% PMHR  . Shortness of breath     lying/sitting/exertion  . Seasonal allergies     was on Claritin-stopped taking this pt states it wasn't helping  . Coughing   . History of bronchitis 2yrs ago  . COPD (chronic obstructive pulmonary disease)   . Emphysema   . Peripheral edema     takes Furosemide as needed  . Hypokalemia     hx of;pt states he was taking K+ but now eats a banana daily instead  . Dizziness   . Short-term memory loss   . Arthritis     lower back  . Chronic back pain     DDD/stenosis  . Bruises easily     takes Coumadin daily  . GERD (gastroesophageal reflux disease)     takes Omeprazole bid  . Colon polyps     9 polyps removed 10/13/11  . Urinary frequency   . Urinary urgency   . Diabetes mellitus     takes Metformin and Glimepiride daily  . History  of shingles   . Atrial flutter     s/p CTI ablation by Dr Rayann Heman  . Paroxysmal atrial fibrillation   . Pneumonia 3-4 yrs ago    none recent  . OSA (obstructive sleep apnea)     not always using cpap  . Brain aneurysm 2009    questionable. A follow up CTA in 2009 showed no evidence of  . Peripheral neuropathy   . Complication of anesthesia September 11, 2012    slow to awaken after ablation   Past Surgical History  Procedure Laterality Date  . Neck surgery  27yrs ago  . Right shoulder surgery  4-20yrs ago    cyst removed  . Left inguinal hernia repair      as a child  . Tee without cardioversion  05/05/2011    Procedure: TRANSESOPHAGEAL ECHOCARDIOGRAM (TEE);  Surgeon: Lelon Perla, MD;  Location: [facility] ENDOSCOPY;  Service: Cardiovascular;  Laterality: N/A;  . Cardioversion  05/05/2011    Procedure: CARDIOVERSION;  Surgeon: Lelon Perla, MD;  Location: [facility] ENDOSCOPY;  Service: Cardiovascular;  Laterality: N/A;  . Throat surgery  4-66yrs ago    "thought " it was cancer but came  back not  . Carpal tunnel release  99/2000    bilateral  . Appendectomy  2-60yrs ago  . Knee surgery  6-34yrs ago    left  . Cardiac catheterization  2008    no significant CAD  . Tonsillectomy    . Hernia repair    . Anterior cervical decomp/discectomy fusion  01/04/2012    Procedure: ANTERIOR CERVICAL DECOMPRESSION/DISCECTOMY FUSION 1 LEVEL/HARDWARE REMOVAL;  Surgeon: Ophelia Charter, MD;  Location: [place] NEURO ORS;  Service: Neurosurgery;  Laterality: N/A;  explore cervical fusion Cervical six - seven  with removal of codman plate anterior cervical decompression with fusion interbody prothesis plating and bonegraft  . Cardioversion Bilateral 07/26/2012    Procedure: CARDIOVERSION;  Surgeon: Minus Breeding, MD;  Location: [place];  Service: Cardiovascular;  Laterality: Bilateral;  . Atrial flutter ablation  09/11/2012    CTI ablation by Dr Rayann Heman  . Colonoscopy with propofol N/A 12/13/2012    Procedure:  COLONOSCOPY WITH PROPOFOL;  Surgeon: Milus Banister, MD;  Location: [facility] ENDOSCOPY;  Service: Endoscopy;  Laterality: N/A;    Current Outpatient Prescriptions  Medication Sig Dispense Refill  . bisoprolol (ZEBETA) 10 MG tablet Take 10 mg by mouth daily.      . Cholecalciferol (VITAMIN D) 2000 UNITS CAPS Take 1 capsule by mouth daily.      [name] diltiazem (CARDIZEM) 30 MG tablet Take 15 mg by mouth 2 (two) times daily as needed (heart rythm).       . furosemide (LASIX) 40 MG tablet Take 40 mg by mouth daily as needed.      [name] glimepiride (AMARYL) 1 MG tablet Take 1 mg by mouth daily before breakfast.       . HYDROcodone-acetaminophen (NORCO) 10-325 MG per tablet Take 1 tablet by mouth every 6 (six) hours as needed (pain).       [name] HYDROcodone-acetaminophen (NORCO/VICODIN) 5-325 MG per tablet Take 1 tablet by mouth every 6 (six) hours as needed for pain.      . Liraglutide (VICTOZA) 18 MG/3ML SOPN Inject 6 mLs into the skin daily.       . metFORMIN (GLUCOPHAGE) 500 MG tablet Take 500 mg by mouth 2 (two) times daily with a meal. Pt stopped taking this yesterday because he is having some side effects from them (10/18/13)      . Multiple Vitamin (MULTIVITAMIN WITH MINERALS) TABS Take 1 tablet by mouth daily.      [name] omeprazole (PRILOSEC) 20 MG capsule Take 20 mg by mouth 2 (two) times daily.       . potassium chloride (KLOR-CON 10) 10 MEQ tablet Take 10 mEq by mouth daily as needed.       . pravastatin (PRAVACHOL) 80 MG tablet Take 1 tablet (80 mg total) by mouth at bedtime.  30 tablet  1  . warfarin (COUMADIN) 5 MG tablet Take 5-7.5 mg by mouth daily. Take 1.5 tablets (7.5 mg) everyday except Tuesday,friday and Sunday take 1 tablet (5 mg)      . dronedarone (MULTAQ) 400 MG tablet Take 1 tablet (400 mg total) by mouth 2 (two) times daily with a meal.  60 tablet  11   No current facility-administered medications for this visit.    Physical Exam: Filed Vitals:   10/18/13 1602  BP: 163/89  Pulse: 72   Height: 6\' 3"  (1.905 m)  Weight: 256 lb 12.8 oz (116.484 kg)    GEN- The patient is overweight and chronically ill appearing, alert and oriented x  3 today.   Head- normocephalic, atraumatic Eyes-  Sclera clear, conjunctiva pink Ears- hearing intact Oropharynx- clear Lungs- prolonged expiratory wheezing,  Heart- Regular rate and rhythm, no murmurs, rubs or gallops, PMI not laterally displaced GI- soft, NT, ND, + BS Extremities- no clubbing, cyanosis, or edema  ekg today reveals sinus rhythm 72 bpm, otherwise normal ekg (Qtc 431) Echo 3/14 is reviewed and reveals at least moderate LA enlargement  Assessment and Plan:  1. Atrial fibrillation The patient has symptomatic paroxysmal atrial fibrillation but also has a fair number of other issues.  Though we could consider ablation, I am concerned about his ongoing weakness, leg numbness, etc.  I think that these issues need to be better addressed prior to consideration of ablation.  I attempted to call Dr Eddie Dibbles office today however the voicemail says that they are closed on Fridays.  I have therefore encouraged the patient to follow-up with Dr Rex Kras for his multiple medical issues. Dr Percival Spanish does not feel that he is a good candidate for tikosyn.  I have therefore initiated multaq 400mg  BID today. Continue long term anticoagulation Repeat echo to further assess LA size We can consider ablation once his other medical issues are better understood  2. OSA Compliance with CPAP is encouraged (he does not use) in order to adequately control arrhythmias I have instructed him to followup with Dr Elsworth Soho on this. He is aware that success rates with ablation are reduced by about 30% when OSA is not adequately treated  3. Tobacco Cessation strongly advised He is not ready to quit  Return in 3 months

## 2013-10-25 ENCOUNTER — Telehealth: Payer: Self-pay | Admitting: Internal Medicine

## 2013-10-25 NOTE — Telephone Encounter (Signed)
Multaq

## 2013-10-25 NOTE — Telephone Encounter (Signed)
New message    Medication that was prescribe by Dr. Rayann Heman is too high $ 100.00 a month . Please advise.

## 2013-10-25 NOTE — Telephone Encounter (Signed)
Which medication are we talking about?

## 2013-10-25 NOTE — Telephone Encounter (Signed)
Spoke with patient and let him know Dr Rayann Heman says the only other choice is a 3 day admission for Sotalol   He says that will not work.  I let him know I could send his message to the refill and pre-certification to see if it needs a PA.  If so that may decrease the cost.  Will check on this and let him know.  Money is a huge issue as he has not been able to work.

## 2013-10-31 NOTE — Telephone Encounter (Signed)
Follow up      Pt is waiting on a response from nurse regarding getting a cheaper medication.  Please call

## 2013-11-01 ENCOUNTER — Ambulatory Visit (INDEPENDENT_AMBULATORY_CARE_PROVIDER_SITE_OTHER): Payer: BC Managed Care – PPO | Admitting: *Deleted

## 2013-11-01 DIAGNOSIS — Z7901 Long term (current) use of anticoagulants: Secondary | ICD-10-CM

## 2013-11-01 DIAGNOSIS — Z5181 Encounter for therapeutic drug level monitoring: Secondary | ICD-10-CM

## 2013-11-01 DIAGNOSIS — I4891 Unspecified atrial fibrillation: Secondary | ICD-10-CM

## 2013-11-01 LAB — POCT INR: INR: 2.3

## 2013-11-01 NOTE — Telephone Encounter (Signed)
I spoke with [name], Pharm D and there is a card for Multaq.  I have gone on line and printed off a coupon for him.  He will pick up today and take to his pharmacy.  He will let me know if this does not help.

## 2013-11-05 ENCOUNTER — Other Ambulatory Visit (HOSPITAL_COMMUNITY): Payer: Self-pay | Admitting: Internal Medicine

## 2013-11-05 DIAGNOSIS — I4819 Other persistent atrial fibrillation: Secondary | ICD-10-CM

## 2013-11-06 ENCOUNTER — Ambulatory Visit (HOSPITAL_COMMUNITY): Payer: BC Managed Care – PPO | Attending: Cardiology | Admitting: Radiology

## 2013-11-06 DIAGNOSIS — I4891 Unspecified atrial fibrillation: Secondary | ICD-10-CM | POA: Insufficient documentation

## 2013-11-06 DIAGNOSIS — F17200 Nicotine dependence, unspecified, uncomplicated: Secondary | ICD-10-CM | POA: Insufficient documentation

## 2013-11-06 DIAGNOSIS — I4892 Unspecified atrial flutter: Secondary | ICD-10-CM | POA: Insufficient documentation

## 2013-11-06 DIAGNOSIS — E669 Obesity, unspecified: Secondary | ICD-10-CM | POA: Insufficient documentation

## 2013-11-06 DIAGNOSIS — I480 Paroxysmal atrial fibrillation: Secondary | ICD-10-CM

## 2013-11-06 DIAGNOSIS — I10 Essential (primary) hypertension: Secondary | ICD-10-CM | POA: Insufficient documentation

## 2013-11-06 DIAGNOSIS — I4819 Other persistent atrial fibrillation: Secondary | ICD-10-CM

## 2013-11-06 DIAGNOSIS — G4733 Obstructive sleep apnea (adult) (pediatric): Secondary | ICD-10-CM | POA: Insufficient documentation

## 2013-11-06 DIAGNOSIS — J438 Other emphysema: Secondary | ICD-10-CM | POA: Insufficient documentation

## 2013-11-06 DIAGNOSIS — I2789 Other specified pulmonary heart diseases: Secondary | ICD-10-CM | POA: Insufficient documentation

## 2013-11-06 DIAGNOSIS — E785 Hyperlipidemia, unspecified: Secondary | ICD-10-CM | POA: Insufficient documentation

## 2013-11-06 DIAGNOSIS — E119 Type 2 diabetes mellitus without complications: Secondary | ICD-10-CM | POA: Insufficient documentation

## 2013-11-06 NOTE — Progress Notes (Signed)
Echocardiogram performed.  

## 2013-11-14 ENCOUNTER — Other Ambulatory Visit: Payer: Self-pay

## 2013-11-14 MED ORDER — PRAVASTATIN SODIUM 80 MG PO TABS: 80.0000 mg | ORAL_TABLET | Freq: Every day | ORAL | Status: DC

## 2013-11-29 ENCOUNTER — Ambulatory Visit (INDEPENDENT_AMBULATORY_CARE_PROVIDER_SITE_OTHER): Payer: BC Managed Care – PPO | Admitting: *Deleted

## 2013-11-29 DIAGNOSIS — Z7901 Long term (current) use of anticoagulants: Secondary | ICD-10-CM

## 2013-11-29 DIAGNOSIS — I4891 Unspecified atrial fibrillation: Secondary | ICD-10-CM

## 2013-11-29 DIAGNOSIS — Z5181 Encounter for therapeutic drug level monitoring: Secondary | ICD-10-CM

## 2013-11-29 LAB — POCT INR: INR: 3.2

## 2013-12-13 ENCOUNTER — Ambulatory Visit (INDEPENDENT_AMBULATORY_CARE_PROVIDER_SITE_OTHER): Payer: BC Managed Care – PPO | Admitting: *Deleted

## 2013-12-13 DIAGNOSIS — I4891 Unspecified atrial fibrillation: Secondary | ICD-10-CM

## 2013-12-13 DIAGNOSIS — Z5181 Encounter for therapeutic drug level monitoring: Secondary | ICD-10-CM

## 2013-12-13 DIAGNOSIS — Z7901 Long term (current) use of anticoagulants: Secondary | ICD-10-CM

## 2013-12-13 LAB — POCT INR: INR: 3.6

## 2013-12-27 ENCOUNTER — Ambulatory Visit (INDEPENDENT_AMBULATORY_CARE_PROVIDER_SITE_OTHER): Payer: BC Managed Care – PPO | Admitting: *Deleted

## 2013-12-27 DIAGNOSIS — Z5181 Encounter for therapeutic drug level monitoring: Secondary | ICD-10-CM

## 2013-12-27 DIAGNOSIS — Z7901 Long term (current) use of anticoagulants: Secondary | ICD-10-CM

## 2013-12-27 DIAGNOSIS — I4891 Unspecified atrial fibrillation: Secondary | ICD-10-CM

## 2013-12-27 LAB — POCT INR: INR: 3

## 2014-01-15 ENCOUNTER — Ambulatory Visit (INDEPENDENT_AMBULATORY_CARE_PROVIDER_SITE_OTHER): Payer: BC Managed Care – PPO | Admitting: Pharmacist

## 2014-01-15 ENCOUNTER — Encounter: Payer: Self-pay | Admitting: Internal Medicine

## 2014-01-15 ENCOUNTER — Ambulatory Visit (INDEPENDENT_AMBULATORY_CARE_PROVIDER_SITE_OTHER): Payer: BC Managed Care – PPO | Admitting: Internal Medicine

## 2014-01-15 VITALS — BP 144/82 | HR 69 | Ht 75.0 in | Wt 256.0 lb

## 2014-01-15 DIAGNOSIS — Z5181 Encounter for therapeutic drug level monitoring: Secondary | ICD-10-CM

## 2014-01-15 DIAGNOSIS — I4891 Unspecified atrial fibrillation: Secondary | ICD-10-CM

## 2014-01-15 DIAGNOSIS — Z7901 Long term (current) use of anticoagulants: Secondary | ICD-10-CM

## 2014-01-15 DIAGNOSIS — Z720 Tobacco use: Secondary | ICD-10-CM

## 2014-01-15 DIAGNOSIS — F17200 Nicotine dependence, unspecified, uncomplicated: Secondary | ICD-10-CM

## 2014-01-15 DIAGNOSIS — G4733 Obstructive sleep apnea (adult) (pediatric): Secondary | ICD-10-CM

## 2014-01-15 DIAGNOSIS — R0981 Nasal congestion: Secondary | ICD-10-CM

## 2014-01-15 DIAGNOSIS — J3489 Other specified disorders of nose and nasal sinuses: Secondary | ICD-10-CM

## 2014-01-15 DIAGNOSIS — G4730 Sleep apnea, unspecified: Secondary | ICD-10-CM

## 2014-01-15 DIAGNOSIS — I10 Essential (primary) hypertension: Secondary | ICD-10-CM

## 2014-01-15 LAB — POCT INR: INR: 2.5

## 2014-01-15 NOTE — Progress Notes (Signed)
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  He continues to have occasional afib.  This is much improved with multaq.  He continues to have frequent leg weakness, leg tingling, and a concern of unsteadiness.  He also reports that he has had difficulty with his memory.  He does not use his CPAP.  He reports frequent nasal congestion and difficulty with sinuses.  Today, he denies symptoms of chest pain, shortness of breath, or  lower extremity edema.  He continues to smoke and is not ready to quit.  The patient is otherwise without complaint today.   Past Medical History  Diagnosis Date  . HLD (hyperlipidemia)     takes Pravastatin daily  . Obesity   . Tobacco abuse   . Overdose 2009    unintentional Flacanide overdose  . HTN (hypertension)     takes Prinizide daily  . Dyspnea     LHC 11/08: EF 60%, normal coronary arteries;  ETT-echo 2/11: normal at 71% PMHR  . Shortness of breath     lying/sitting/exertion  . Seasonal allergies     was on Claritin-stopped taking this pt states it wasn't helping  . Coughing   . History of bronchitis 97yrs ago  . COPD (chronic obstructive pulmonary disease)   . Emphysema   . Peripheral edema     takes Furosemide as needed  . Hypokalemia     hx of;pt states he was taking K+ but now eats a banana daily instead  . Dizziness   . Short-term memory loss   . Arthritis     lower back  . Chronic back pain     DDD/stenosis  . Bruises easily     takes Coumadin daily  . GERD (gastroesophageal reflux disease)     takes Omeprazole bid  . Colon polyps     9 polyps removed 10/13/11  . Urinary frequency   . Urinary urgency   . Diabetes mellitus     takes Metformin and Glimepiride daily  . History of shingles   . Atrial flutter     s/p CTI ablation by Dr Rayann Heman  . Paroxysmal atrial fibrillation   . Pneumonia 3-4 yrs ago    none recent  . OSA (obstructive  sleep apnea)     not always using cpap  . Brain aneurysm 2009    questionable. A follow up CTA in 2009 showed no evidence of  . Peripheral neuropathy   . Complication of anesthesia September 11, 2012    slow to awaken after ablation   Past Surgical History  Procedure Laterality Date  . Neck surgery  41yrs ago  . Right shoulder surgery  4-40yrs ago    cyst removed  . Left inguinal hernia repair      as a child  . Tee without cardioversion  05/05/2011    Procedure: TRANSESOPHAGEAL ECHOCARDIOGRAM (TEE);  Surgeon: Lelon Perla, MD;  Location[facility] ENDOSCOPY;  Service: Cardiovascular;  Laterality: N/A;  . Cardioversion  05/05/2011    Procedure: CARDIOVERSION;  Surgeon: Lelon Perla, MD;  Location: [facility] ENDOSCOPY;  Service: Cardiovascular;  Laterality: N/A;  . Throat surgery  4-95yrs ago    "thought " it was cancer but came back not  . Carpal tunnel release  99/2000    bilateral  . Appendectomy  2-20yrs ago  . Knee surgery  6-49yrs ago    left  . Cardiac catheterization  2008    no significant CAD  . Tonsillectomy    . Hernia repair    . Anterior cervical decomp/discectomy fusion  01/04/2012    Procedure: ANTERIOR CERVICAL DECOMPRESSION/DISCECTOMY FUSION 1 LEVEL/HARDWARE REMOVAL;  Surgeon: Ophelia Charter, MD;  Location: [facility] NEURO ORS;  Service: Neurosurgery;  Laterality: N/A;  explore cervical fusion Cervical six - seven  with removal of codman plate anterior cervical decompression with fusion interbody prothesis plating and bonegraft  . Cardioversion Bilateral 07/26/2012    Procedure: CARDIOVERSION;  Surgeon: Minus Breeding, MD;  Location: [place];  Service: Cardiovascular;  Laterality: Bilateral;  . Atrial flutter ablation  09/11/2012    CTI ablation by Dr Rayann Heman  . Colonoscopy with propofol N/A 12/13/2012    Procedure: COLONOSCOPY WITH PROPOFOL;  Surgeon: Milus Banister, MD;  Location: [facility] ENDOSCOPY;  Service: Endoscopy;  Laterality: N/A;    Current Outpatient Prescriptions   Medication Sig Dispense Refill  . bisoprolol (ZEBETA) 10 MG tablet Take 10 mg by mouth daily.      . Cholecalciferol (VITAMIN D) 2000 UNITS CAPS Take 1 capsule by mouth daily.      [name] diltiazem (CARDIZEM) 30 MG tablet Take 15 mg by mouth 2 (two) times daily as needed.       . dronedarone (MULTAQ) 400 MG tablet Take 1 tablet (400 mg total) by mouth 2 (two) times daily with a meal.  60 tablet  11  . furosemide (LASIX) 40 MG tablet Take 40 mg by mouth daily as needed for fluid.       [name] glimepiride (AMARYL) 1 MG tablet Take 1 mg by mouth daily before breakfast.       . HYDROcodone-acetaminophen (NORCO) 10-325 MG per tablet Take 1 tablet by mouth every 6 (six) hours as needed (pain).       [name] HYDROcodone-acetaminophen (NORCO/VICODIN) 5-325 MG per tablet Take 1 tablet by mouth every 6 (six) hours as needed for pain.      . Liraglutide (VICTOZA) 18 MG/3ML SOPN Inject 6 mLs into the skin daily.       . metFORMIN (GLUCOPHAGE) 500 MG tablet Take 500 mg by mouth 2 (two) times daily with a meal.       . Multiple Vitamin (MULTIVITAMIN WITH MINERALS) TABS Take 1 tablet by mouth daily.      [name] omeprazole (PRILOSEC) 20 MG capsule Take 20 mg by mouth 2 (two) times daily.       . potassium chloride (KLOR-CON 10) 10 MEQ tablet Take 10 mEq by mouth daily as needed (Take 1 tablet when you take Lasix).       . pravastatin (PRAVACHOL) 80 MG tablet Take 1 tablet (80 mg total) by mouth at bedtime.  30 tablet  6  . warfarin (COUMADIN) 5 MG tablet Take 5-7.5 mg by mouth daily. Take 1.5 tablets (7.5 mg) everyday except Tuesday,friday and Sunday take 1 tablet (5 mg)       No current facility-administered medications for this visit.    Physical Exam: Filed Vitals:   01/15/14 0930  BP: 144/82  Pulse: 69  Height: 6\' 3"  (1.905 m)  Weight: 256 lb (116.121 kg)    GEN- The patient is overweight and chronically ill appearing, alert and oriented x 3 today.   Head- normocephalic, atraumatic Eyes-  Sclera clear, conjunctiva  pink Ears- hearing intact Oropharynx- clear Lungs- prolonged expiratory wheezing,  Heart- Regular rate and rhythm, no murmurs, rubs or gallops, PMI not laterally displaced GI- soft, NT, ND, + BS  Extremities- no clubbing, cyanosis, or edema, he has several lesions on his R forearm which I have strongly advised that he see dermatology asap to address  ekg today reveals sinus rhythm 69 bpm, nonspecific ST/T changes otherwise normal ekg (Qtc 441) Echo reveals pulmonary hypertension and LA enlargment  Assessment and Plan:  1. Atrial fibrillation The patient has symptomatic paroxysmal atrial fibrillation but also has a fair number of other issues.  I think that until he is compliant with therapies for sleep apnea, he is not a good candidate for ablation.  He is presently doing well with multaq.      2. OSA Compliance with CPAP is encouraged (he does not use) in order to adequately control arrhythmias Given difficulty with his sinuses, I will refer to Dr Warren Danes for evaluation and management.  He has pulmonary hypertension and significant LA enlargement as sequela of his sleep apnea which are also likely the drivers for his afib. He is aware that success rates with ablation are reduced by about 30% when OSA is not adequately treated  3. Tobacco Cessation strongly advised He is not ready to quit  Return in 3 months

## 2014-01-15 NOTE — Patient Instructions (Signed)
Your physician recommends that you schedule a follow-up appointment in: 3 months with Dr Rayann Heman   You have been referred to Dr Warren Danes  ENT for sleep apnea and nasal congestion

## 2014-02-12 ENCOUNTER — Encounter (HOSPITAL_COMMUNITY): Payer: Self-pay | Admitting: Pharmacy Technician

## 2014-02-12 ENCOUNTER — Ambulatory Visit (INDEPENDENT_AMBULATORY_CARE_PROVIDER_SITE_OTHER): Payer: BC Managed Care – PPO | Admitting: Pharmacist

## 2014-02-12 DIAGNOSIS — Z7901 Long term (current) use of anticoagulants: Secondary | ICD-10-CM

## 2014-02-12 DIAGNOSIS — I4891 Unspecified atrial fibrillation: Secondary | ICD-10-CM

## 2014-02-12 DIAGNOSIS — Z5181 Encounter for therapeutic drug level monitoring: Secondary | ICD-10-CM

## 2014-02-12 LAB — POCT INR: INR: 1.5

## 2014-02-13 NOTE — Pre-Procedure Instructions (Signed)
[name]  02/13/2014   Your procedure is scheduled on:  Wednesday, October 7th  Report to [facility] Admitting at 8 AM.  Call this number if you have problems the morning of surgery: [phone]   Remember:   Do not eat food or drink liquids after midnight.   Take these medicines the morning of surgery with A SIP OF WATER: multaq, zebeta, prilosec, pain medication if needed, cardizem if needed, inhaler if needed   Do not wear jewelry.  Do not wear lotions, powders, or perfumes. You may wear deodorant.  Do not shave 48 hours prior to surgery. Men may shave face and neck.  Do not bring valuables to the hospital.  [facility] is not responsible  for any belongings or valuables.               Contacts, dentures or bridgework may not be worn into surgery.  Leave suitcase in the car. After surgery it may be brought to your room.  For patients admitted to the hospital, discharge time is determined by your  treatment team.               Patients discharged the day of surgery will not be allowed to drive home.  Please read over the following fact sheets that you were given: Pain Booklet, Coughing and Deep Breathing and Surgical Site Infection Prevention [facility] - Preparing for Surgery  Before surgery, you can play an important role.  Because skin is not sterile, your skin needs to be as free of germs as possible.  You can reduce the number of germs on you skin by washing with CHG (chlorahexidine gluconate) soap before surgery.  CHG is an antiseptic cleaner which kills germs and bonds with the skin to continue killing germs even after washing.  Please DO NOT use if you have an allergy to CHG or antibacterial soaps.  If your skin becomes reddened/irritated stop using the CHG and inform your nurse when you arrive at Short Stay.  Do not shave (including legs and underarms) for at least 48 hours prior to the first CHG shower.  You may shave your face.  Please follow these  instructions carefully:   1.  Shower with CHG Soap the night before surgery and the morning of Surgery.  2.  If you choose to wash your hair, wash your hair first as usual with your normal shampoo.  3.  After you shampoo, rinse your hair and body thoroughly to remove the shampoo.  4.  Use CHG as you would any other liquid soap.  You can apply CHG directly to the skin and wash gently with scrungie or a clean washcloth.  5.  Apply the CHG Soap to your body ONLY FROM THE NECK DOWN.  Do not use on open wounds or open sores.  Avoid contact with your eyes, ears, mouth and genitals (private parts).  Wash genitals (private parts) with your normal soap.  6.  Wash thoroughly, paying special attention to the area where your surgery will be performed.  7.  Thoroughly rinse your body with warm water from the neck down.  8.  DO NOT shower/wash with your normal soap after using and rinsing off the CHG Soap.  9.  Pat yourself dry with a clean towel.            10.  Wear clean pajamas.            11.  Place clean sheets on  your bed the night of your first shower and do not sleep with pets.  Day of Surgery  Do not apply any lotions/deoderants the morning of surgery.  Please wear clean clothes to the hospital/surgery center.

## 2014-02-14 ENCOUNTER — Encounter (HOSPITAL_COMMUNITY): Payer: Self-pay

## 2014-02-14 ENCOUNTER — Encounter (HOSPITAL_COMMUNITY)
Admission: RE | Admit: 2014-02-14 | Discharge: 2014-02-14 | Disposition: A | Discharge status: Home / Self Care | Payer: BC Managed Care – PPO | Source: Ambulatory Visit | Attending: Otolaryngology | Admitting: Otolaryngology

## 2014-02-14 DIAGNOSIS — G4733 Obstructive sleep apnea (adult) (pediatric): Secondary | ICD-10-CM | POA: Insufficient documentation

## 2014-02-14 DIAGNOSIS — J342 Deviated nasal septum: Secondary | ICD-10-CM | POA: Insufficient documentation

## 2014-02-14 DIAGNOSIS — Z01812 Encounter for preprocedural laboratory examination: Secondary | ICD-10-CM | POA: Diagnosis present

## 2014-02-14 DIAGNOSIS — J343 Hypertrophy of nasal turbinates: Secondary | ICD-10-CM | POA: Diagnosis present

## 2014-02-14 LAB — COMPREHENSIVE METABOLIC PANEL
ALBUMIN: 4 g/dL (ref 3.5–5.2)
ALT: 44 U/L (ref 0–53)
AST: 41 U/L — ABNORMAL HIGH (ref 0–37)
Alkaline Phosphatase: 136 U/L — ABNORMAL HIGH (ref 39–117)
Anion gap: 14 (ref 5–15)
BUN: 10 mg/dL (ref 6–23)
CALCIUM: 9.7 mg/dL (ref 8.4–10.5)
CO2: 26 meq/L (ref 19–32)
CREATININE: 0.66 mg/dL (ref 0.50–1.35)
Chloride: 98 mEq/L (ref 96–112)
GFR calc Af Amer: >90 mL/min (ref >90)
Glucose, Bld: 225 mg/dL — ABNORMAL HIGH (ref 70–99)
Potassium: 4.1 mEq/L (ref 3.7–5.3)
Sodium: 138 mEq/L (ref 137–147)
Total Bilirubin: 0.3 mg/dL (ref 0.3–1.2)
Total Protein: 7.2 g/dL (ref 6.0–8.3)

## 2014-02-14 LAB — CBC
HCT: 44.2 % (ref 39.0–52.0)
Hemoglobin: 15.1 g/dL (ref 13.0–17.0)
MCH: 31.1 pg (ref 26.0–34.0)
MCHC: 34.2 g/dL (ref 30.0–36.0)
MCV: 90.9 fL (ref 78.0–100.0)
Platelets: 128 K/uL — ABNORMAL LOW (ref 150–400)
RBC: 4.86 MIL/uL (ref 4.22–5.81)
RDW: 13.6 % (ref 11.5–15.5)
WBC: 7.3 K/uL (ref 4.0–10.5)

## 2014-02-14 NOTE — Progress Notes (Addendum)
Has significant cardiac hx.  Dr. Percival Spanish is cardio--LOV 09/2013.  Dr. Rayann Heman did ablation, which did not take. (Notes are in epic)      Also takes coumadin and has been instructed to stop it yesterday--Sunday.  Will leave chart in 'review basket'.. EKG from 9/2 is NSR, but has hx of afib-flutter @ times. OSA  Dx  approx 5-6 yrs ago.  He however, does NOT wear the mask..the settings are at 6, and with his sinus drainage, he doesn't put it on. Abnormal labs faxed to Dr. Noreene Filbert office--platelet ct 128

## 2014-02-17 NOTE — Progress Notes (Signed)
Anesthesia Chart Review:  [name] is a 58 year old male posted for nasal septoplasty with bilateral turbinate reduction on 64/4/03 by Dr. Erik Obey.  History includes smoking, COPD/emphysema, HTN, HLD, afib/flutter s/p DCCV 05/05/11 and 07/26/13 and aflutter ablation 09/11/12 with continued paroxsymal afib sometimes with RVR, DM2, peripheral neuropathy, severe OSA not always compliant with CPAP use, SOB, GERD, arthritis, unintentional flecainide overdose '09, peripheral edema (uses furosemide PRN), short-term memory loss, chronic back pain, bruises easily, question of brain aneurysm (although no evidence of intracranial aneurysm found on CTA of the head 03/25/08), tonsillectomy, appendectomy, hernia repair, ACDF with exploration and removal of plating 12/2011. He reported being slow to awaken from anesthesia following his 08/2012 ablation done under [facility] with LMA. He admits to drinking 2-4 alcoholic beverages on most days. Of note, he was referred to pulmonology in 2013 due to wheezing. Dr. Melvyn Novas felt propanolol was a triggering agent, so he changed him to a more selective b-blocker (Bystolic) and felt he should also likely avoid ACE inhibitors. Dr. Elsworth Soho is now his primary pulmonologist. [name] is currently on bisoprolol for his b-blocker therapy and also on diltiazem and dronedarone for anti-arrhythmia therapy.  He is also on warfarin.  BMI is consistent with obesity. PCP is Dr. [name].   Primary cardiologist is Dr. Percival Spanish, last visit 10/01/13. EP cardiologist is Dr. Rayann Heman, last visit 01/15/14 with referral to ENT for further management of his OSA since sequela of his OSA was thought to be a driving force in his PAF.  Dr. Rayann Heman did not think that he was a candidate for another ablation at that time since success rates were reduced to about 30% when OSA is not adequately treated.  I spoke with Amy at Dr. Noreene Filbert office who confirmed that Dr. Erik Obey called and spoke with Dr. Rayann Heman who gave permission to hold  Coumadin five days prior to this surgery.  Echo on 11/06/13 showed: - Left ventricle: The cavity size was normal. Wall thickness was increased in a pattern of moderate LVH. Systolic function was normal. The estimated ejection fraction was in the range of 60% to 65%. Wall motion was normal; there were no regional wall motion abnormalities. Features are consistent with a pseudonormal left ventricular filling pattern, with concomitant abnormal relaxation and increased filling pressure (grade 2 diastolic dysfunction). - Aorta: Ascending aortic diameter: 40 mm (S). - Ascending aorta: The ascending aorta was mildly dilated. - Mitral valve: Mildly calcified annulus. There was trivial regurgitation. - Left atrium: The atrium was moderately dilated. - Right ventricle: The cavity size was normal. Systolic function was normal. - Right atrium: The atrium was mildly dilated. - Tricuspid valve: Peak RV-RA gradient (S): 43 mm Hg. - Pulmonary arteries: PA peak pressure: 46 mm Hg (S). - Inferior vena cava: The vessel was normal in size. The respirophasic diameter changes were in the normal range (>= 50%), consistent with normal central venous pressure. Impressions: Normal LV size with moderate LV hypertrophy. EF 60-65%. Moderate diastolic dysfunction. Normal RV size and systolic function. Mild pulmonary hypertension. Biatrial enlargement.  Nuclear stress test on 02/18/13 showed: Overall Impression: Low risk stress nuclear study with a small, severe intensity reversible apical defect; moderate size, medium intensity, partially reversible inferior defect; findings c/w with mild apical ischemia; prior inferior infarct and mild peri-infarct ischemia..  LV Ejection Fraction: 53%. LV Wall Motion: NL LV Function; NL Wall Motion. (Report reviewed by Dr. Percival Spanish. Since results felt low risk and [name] was without recurrent symptoms, cardiac cath was not recommended. This  was reiterated at his last appointment in 09/2013 as  well.)  Cardiac cath on 03/23/07 showed: Normal coronaries, LVEF 60%.   EKG on 01/15/14 showed NSR, non-specific T wave abnormality, primarily lateral leads V5-6 which was present on EKGs dating back to at least 02/15/13.   1V CXR on 10/10/13 showed: Cardiomediastinal silhouette is stable. No acute infiltrate or pleural effusion. No pulmonary edema. Bony thorax is unremarkable. IMPRESSION: No active disease.  Preoperative labs noted. PLT 128K, previously 130-173K since 09/13/12. AST/ALT 41/44. Glucose 225. He will get a fasting CBG on arrival. He will also need a PT/PTT on the day of surgery.   Cardiologist Dr. Rayann Heman is aware of plans for surgery and gave permission to hold Coumadin. His EKG appears stable.  No chest pain or symptoms documented at his PAT visit or at his recent cardiology visit. He will be further evaluated by his assigned anesthesiologist on the day of surgery by his assigned anesthesiologist, but if no new CV symptoms or new arrhythmias/rapid afib then I would anticipate that he could proceed as planned.    George Hugh [facility] Short Stay Center/Anesthesiology Phone [phone] 02/17/2014 2:46 PM

## 2014-02-18 ENCOUNTER — Other Ambulatory Visit: Payer: Self-pay | Admitting: Otolaryngology

## 2014-02-18 NOTE — H&P (Signed)
[name], [name] 58 y.o., male [id number]     Chief Complaint: nasal obstruction  HPI: 58 year old white male comes in for evaluation of nasal obstruction and sleep apnea.  He is disabled on a comeback issues and cardiac arrhythmias.  he never feels like his nose breathes well.  He has known sleep apnea, even preferring to sleep on his side.  He has had some degree of chronic rhinorrhea, clear, for 20 or more years.  He smoked 2 packs per day for many years, and is now back to one half pack per day over the past 2 years.  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  TSH was 1.5 19 Sep 2013.   He does describe some tinnitus in both ears.  Hearing seems basically okay.   He has a history of atrial fibrillation and has had one prior  ablation.  He is currently on Coumadin.  I spoke with Dr. [name] today.   He thinks he is okay to undergo nasal surgery.  we will inform the patient and  begin making plans.  I spoke with Dr. Thompson Grayer.  The patient has intermittent A. fib.  He is on Coumadin.  Stroke risk factors include hypertension and smoking.  He had a catheterization with no intrinsic cardiac disease and good ejection fraction.  We will stop his Coumadin 5 days ahead of surgery and then restart immediately afterwards.  We will talk with Dr. [name] before going ahead with scheduling.  Preoperative visit.  We have received clearance from his primary physician and from Dr. Rayann Heman.  He will stop his Coumadin 5 days before surgery.  He already has some hydrocodone pain medicine at home.  He continues to smoke less than one quarter pack per day and has one caffeinated cola  beverage.     We're planning septoplasty and reduction of turbinates to assist in general breathing but also to help with snoring.  He has sleep apnea but is not wearing his CPAP.  We probably  need to go back to CPAP when we establish good nasal patency.   I discussed the surgery in detail including risks and complications.  Rectums were answered and informed consent obtained.  Prescriptions for Keflex and oxycodone written and given.  I discussed nasal hygiene measures, and advancement of diet and activity.  We will remove the packs in the hospital before he goes home, and the septal splints 10 days thereafter.  PMH: Past Medical History  Diagnosis Date  . HLD (hyperlipidemia)     takes Pravastatin daily  . Obesity   . Tobacco abuse   . Overdose 2009    unintentional Flacanide overdose  . HTN (hypertension)     takes Prinizide daily  . Dyspnea     LHC 11/08: EF 60%, normal coronary arteries;  ETT-echo 2/11: normal at 71% PMHR  . Shortness of breath     lying/sitting/exertion  . Seasonal allergies     was on Claritin-stopped taking this pt states it wasn't helping  . Coughing   . History of bronchitis 99yrs ago  . COPD (chronic obstructive pulmonary disease)   . Emphysema   . Peripheral edema     takes Furosemide as needed  . Hypokalemia     hx of;pt states he was taking K+ but now eats a banana daily instead  . Dizziness   .  Short-term memory loss   . Arthritis     lower back  . Chronic back pain     DDD/stenosis  . Bruises easily     takes Coumadin daily  . GERD (gastroesophageal reflux disease)     takes Omeprazole bid  . Colon polyps     9 polyps removed 10/13/11  . Urinary frequency   . Urinary urgency   . Diabetes mellitus     takes Metformin and Glimepiride daily  . History of shingles   . Atrial flutter     s/p CTI ablation by Dr Rayann Heman  . Paroxysmal atrial fibrillation   . Pneumonia 3-4 yrs ago    none recent  . OSA (obstructive sleep apnea)     not always using cpap  . Brain aneurysm 2009    questionable. A follow up CTA in 2009 showed no evidence of  . Peripheral neuropathy   . Complication of anesthesia September 11, 2012    slow to awaken  after ablation    Surg Hx: Past Surgical History  Procedure Laterality Date  . Neck surgery  26yrs ago  . Right shoulder surgery  4-79yrs ago    cyst removed  . Left inguinal hernia repair      as a child  . Tee without cardioversion  05/05/2011    Procedure: TRANSESOPHAGEAL ECHOCARDIOGRAM (TEE);  Surgeon: Lelon Perla, MD;  Location: [facility] ENDOSCOPY;  Service: Cardiovascular;  Laterality: N/A;  . Cardioversion  05/05/2011    Procedure: CARDIOVERSION;  Surgeon: Lelon Perla, MD;  Location: [facility] ENDOSCOPY;  Service: Cardiovascular;  Laterality: N/A;  . Throat surgery  4-70yrs ago    "thought " it was cancer but came back not  . Carpal tunnel release  99/2000    bilateral  . Appendectomy  2-75yrs ago  . Knee surgery  6-65yrs ago    left  . Cardiac catheterization  2008    no significant CAD  . Tonsillectomy    . Hernia repair    . Anterior cervical decomp/discectomy fusion  01/04/2012    Procedure: ANTERIOR CERVICAL DECOMPRESSION/DISCECTOMY FUSION 1 LEVEL/HARDWARE REMOVAL;  Surgeon: Ophelia Charter, MD;  Location: [place] NEURO ORS;  Service: Neurosurgery;  Laterality: N/A;  explore cervical fusion Cervical six - seven  with removal of codman plate anterior cervical decompression with fusion interbody prothesis plating and bonegraft  . Cardioversion Bilateral 07/26/2012    Procedure: CARDIOVERSION;  Surgeon: Minus Breeding, MD;  Location: [place];  Service: Cardiovascular;  Laterality: Bilateral;  . Atrial flutter ablation  09/11/2012    CTI ablation by Dr Rayann Heman  . Colonoscopy with propofol N/A 12/13/2012    Procedure: COLONOSCOPY WITH PROPOFOL;  Surgeon: Milus Banister, MD;  Location: [facility] ENDOSCOPY;  Service: Endoscopy;  Laterality: N/A;    FHx:   Family History  Problem Relation Age of Onset  . Colon cancer Neg Hx   . Anesthesia problems Neg Hx   . Hypotension Neg Hx   . Malignant hyperthermia Neg Hx   . Pseudochol deficiency Neg Hx    SocHx:  reports that he has been  smoking Cigarettes.  He has a 12 pack-year smoking history. He has never used smokeless tobacco. He reports that he drinks alcohol. He reports that he does not use illicit drugs.  ALLERGIES:  Allergies  Allergen Reactions  . Adhesive [Tape]     itching  . Latex Itching    When tape is on the skin too long     (  Not in a hospital admission)  No results found for this or any previous visit (from the past 48 hour(s)). No results found.  MIW:OEHOZYYQ: Feeling tired (fatigue).  No fever.  Night sweats.  No recent weight loss. Head: No headache. Eyes: No eye symptoms. Otolaryngeal: No hearing loss  and no earache.  Tinnitus.  No purulent nasal discharge.  Nasal passage blockage (stuffiness)  and snoring.  No sneezing, no hoarseness, and no sore throat. Cardiovascular: No chest pain or discomfort  and no palpitations. Pulmonary: Dyspnea  and cough.  No wheezing. Gastrointestinal: No dysphagia.  Heartburn.  No nausea, no abdominal pain, and no melena.  No diarrhea. Genitourinary: No dysuria. Endocrine: Muscle weakness. Musculoskeletal: No calf muscle cramps, no arthralgias, and no soft tissue swelling. Neurological: No dizziness  and no fainting.  Tingling  and numbness. Psychological: No anxiety  and no depression. Skin: No rash  BP:160/78,  HR: 74 b/min,  Height: 6 ft 3 in, Weight: 254 lb , BMI: 31.7 kg/m2,   PHYSICAL EXAM: He smells of tobacco smoke.  He is breathing mostly through his mouth.  Again, he has a very narrow hard palate.  the internal nose is quite narrow with some mucosal excoriation and crusting consistent with his smoking.     Lungs: Clear to auscultation Heart: Regular rate and rhythm without murmurs Abdomen: Soft, active Extremities: Normal configuration Neurologic: Symmetric, grossly intact.   Assessment/Plan Deviated nasal septum (470) (J34.2). Obstructive sleep apnea (327.23) (G47.33). Hypertrophy of nasal turbinates (478.0) (J34.3).  We are doing your  surgery next week.  you will stay overnight in the hospital one night.  I am leaving you prescription for oxycodone strong narcotic pain medication, and Keflex antibiotics. you  already have some hydrocodone which is medium strength narcotic pain medication.  After we pull the packs out of your nose, you need to start using  nasal hygiene measures frequently.  Recheck here 10 days after surgery.  No strenuous activities for 2 full weeks.  Oxycodone-Acetaminophen 5-325 MG Oral Tablet;TAKE 1 TO 2 TABLETS EVERY 4 TO 6 HOURS AS NEEDED FOR PAIN; Qty30; R0; Rx. Cephalexin 500 MG Oral Capsule;TAKE 1 CAPSULE 4 TIMES DAILY; MGN00; R0; Rx.  Erik Obey, Amarrah Meinhart 37/0/4888, 5:57 PM

## 2014-02-19 ENCOUNTER — Ambulatory Visit (HOSPITAL_COMMUNITY): Payer: BC Managed Care – PPO | Admitting: Anesthesiology

## 2014-02-19 ENCOUNTER — Encounter (HOSPITAL_COMMUNITY): Payer: Self-pay | Admitting: *Deleted

## 2014-02-19 ENCOUNTER — Encounter (HOSPITAL_COMMUNITY): Payer: BC Managed Care – PPO | Admitting: Vascular Surgery

## 2014-02-19 ENCOUNTER — Encounter (HOSPITAL_COMMUNITY)
Admission: RE | Disposition: A | Discharge status: Home / Self Care | Payer: Self-pay | Source: Ambulatory Visit | Attending: Otolaryngology

## 2014-02-19 ENCOUNTER — Observation Stay (HOSPITAL_COMMUNITY)
Admission: RE | Admit: 2014-02-19 | Discharge: 2014-02-20 | Disposition: A | Discharge status: Home / Self Care | Payer: BC Managed Care – PPO | Source: Ambulatory Visit | Attending: Otolaryngology | Admitting: Otolaryngology

## 2014-02-19 DIAGNOSIS — E669 Obesity, unspecified: Secondary | ICD-10-CM | POA: Diagnosis not present

## 2014-02-19 DIAGNOSIS — J343 Hypertrophy of nasal turbinates: Secondary | ICD-10-CM | POA: Diagnosis not present

## 2014-02-19 DIAGNOSIS — I480 Paroxysmal atrial fibrillation: Secondary | ICD-10-CM | POA: Insufficient documentation

## 2014-02-19 DIAGNOSIS — E876 Hypokalemia: Secondary | ICD-10-CM | POA: Diagnosis not present

## 2014-02-19 DIAGNOSIS — E119 Type 2 diabetes mellitus without complications: Secondary | ICD-10-CM | POA: Insufficient documentation

## 2014-02-19 DIAGNOSIS — Z720 Tobacco use: Secondary | ICD-10-CM | POA: Diagnosis not present

## 2014-02-19 DIAGNOSIS — Z91040 Latex allergy status: Secondary | ICD-10-CM | POA: Insufficient documentation

## 2014-02-19 DIAGNOSIS — J342 Deviated nasal septum: Secondary | ICD-10-CM | POA: Diagnosis present

## 2014-02-19 DIAGNOSIS — G4733 Obstructive sleep apnea (adult) (pediatric): Secondary | ICD-10-CM | POA: Insufficient documentation

## 2014-02-19 DIAGNOSIS — I10 Essential (primary) hypertension: Secondary | ICD-10-CM | POA: Insufficient documentation

## 2014-02-19 DIAGNOSIS — Z79899 Other long term (current) drug therapy: Secondary | ICD-10-CM | POA: Diagnosis not present

## 2014-02-19 DIAGNOSIS — K219 Gastro-esophageal reflux disease without esophagitis: Secondary | ICD-10-CM | POA: Insufficient documentation

## 2014-02-19 DIAGNOSIS — Z7901 Long term (current) use of anticoagulants: Secondary | ICD-10-CM | POA: Diagnosis not present

## 2014-02-19 DIAGNOSIS — E785 Hyperlipidemia, unspecified: Secondary | ICD-10-CM | POA: Diagnosis not present

## 2014-02-19 DIAGNOSIS — J439 Emphysema, unspecified: Secondary | ICD-10-CM | POA: Diagnosis not present

## 2014-02-19 HISTORY — PX: NASAL SEPTOPLASTY W/ TURBINOPLASTY: SHX2070

## 2014-02-19 LAB — GLUCOSE, CAPILLARY
GLUCOSE-CAPILLARY: 247 mg/dL — AB (ref 70–99)
GLUCOSE-CAPILLARY: 211 mg/dL — AB (ref 70–99)
Glucose-Capillary: 248 mg/dL — ABNORMAL HIGH (ref 70–99)
Glucose-Capillary: 190 mg/dL — ABNORMAL HIGH (ref 70–99)
Glucose-Capillary: 217 mg/dL — ABNORMAL HIGH (ref 70–99)
Glucose-Capillary: 235 mg/dL — ABNORMAL HIGH (ref 70–99)

## 2014-02-19 LAB — PROTIME-INR
INR: 1.07 (ref 0.00–1.49)
Prothrombin Time: 13.9 seconds (ref 11.6–15.2)

## 2014-02-19 LAB — APTT: aPTT: 31 seconds (ref 24–37)

## 2014-02-19 SURGERY — SEPTOPLASTY, NOSE, WITH NASAL TURBINATE REDUCTION
Anesthesia: General | Laterality: Bilateral

## 2014-02-19 MED ORDER — ALBUTEROL SULFATE (2.5 MG/3ML) 0.083% IN NEBU: 3.0000 mL | INHALATION_SOLUTION | Freq: Four times a day (QID) | RESPIRATORY_TRACT | Status: DC | PRN

## 2014-02-19 MED ORDER — ONDANSETRON HCL 4 MG PO TABS: 4.0000 mg | ORAL_TABLET | ORAL | Status: DC | PRN

## 2014-02-19 MED ORDER — FUROSEMIDE 40 MG PO TABS
40.0000 mg | ORAL_TABLET | Freq: Every day | ORAL | Status: DC | PRN
  Filled 2014-02-19: qty 1

## 2014-02-19 MED ORDER — OXYCODONE-ACETAMINOPHEN 5-325 MG PO TABS: 1.0000 | ORAL_TABLET | Freq: Four times a day (QID) | ORAL | Status: DC | PRN

## 2014-02-19 MED ORDER — LACTATED RINGERS IV SOLN
INTRAVENOUS | Status: DC | PRN
  Administered 2014-02-19 (×2): via INTRAVENOUS

## 2014-02-19 MED ORDER — FENTANYL CITRATE 0.05 MG/ML IJ SOLN
INTRAMUSCULAR | Status: AC
  Filled 2014-02-19: qty 5

## 2014-02-19 MED ORDER — FENTANYL CITRATE 0.05 MG/ML IJ SOLN
INTRAMUSCULAR | Status: AC
  Filled 2014-02-19: qty 2

## 2014-02-19 MED ORDER — NEOSTIGMINE METHYLSULFATE 10 MG/10ML IV SOLN
INTRAVENOUS | Status: DC | PRN
  Administered 2014-02-19: 5 mg via INTRAVENOUS

## 2014-02-19 MED ORDER — PHENYLEPHRINE HCL 10 MG/ML IJ SOLN
10.0000 mg | INTRAMUSCULAR | Status: DC | PRN
  Administered 2014-02-19: 40 ug/min via INTRAVENOUS

## 2014-02-19 MED ORDER — DRONEDARONE HCL 400 MG PO TABS
400.0000 mg | ORAL_TABLET | Freq: Two times a day (BID) | ORAL | Status: DC
  Administered 2014-02-19 – 2014-02-20 (×2): 400 mg via ORAL
  Filled 2014-02-19 (×6): qty 1

## 2014-02-19 MED ORDER — LIDOCAINE HCL (CARDIAC) 20 MG/ML IV SOLN
INTRAVENOUS | Status: DC | PRN
  Administered 2014-02-19: 100 mg via INTRAVENOUS

## 2014-02-19 MED ORDER — ACETAMINOPHEN 10 MG/ML IV SOLN
1000.0000 mg | Freq: Four times a day (QID) | INTRAVENOUS | Status: DC
  Administered 2014-02-19: 1000 mg via INTRAVENOUS
  Filled 2014-02-19 (×4): qty 100

## 2014-02-19 MED ORDER — DEXMEDETOMIDINE HCL 200 MCG/2ML IV SOLN
INTRAVENOUS | Status: DC | PRN
Start: 2014-02-19 — End: 2014-02-19
  Administered 2014-02-19: 10 ug via INTRAVENOUS

## 2014-02-19 MED ORDER — LIRAGLUTIDE 18 MG/3ML ~~LOC~~ SOPN
6.0000 mL | PEN_INJECTOR | Freq: Every day | SUBCUTANEOUS | Status: DC
  Administered 2014-02-19: 6 mL via SUBCUTANEOUS

## 2014-02-19 MED ORDER — FENTANYL CITRATE 0.05 MG/ML IJ SOLN
INTRAMUSCULAR | Status: DC | PRN
  Administered 2014-02-19: 75 ug via INTRAVENOUS
  Administered 2014-02-19: 25 ug via INTRAVENOUS

## 2014-02-19 MED ORDER — DEXTROSE 5 % IV SOLN
INTRAVENOUS | Status: DC | PRN
  Administered 2014-02-19: 11:00:00 via INTRAVENOUS

## 2014-02-19 MED ORDER — BACITRACIN ZINC 500 UNIT/GM EX OINT
TOPICAL_OINTMENT | CUTANEOUS | Status: DC | PRN
  Administered 2014-02-19: 1 via TOPICAL

## 2014-02-19 MED ORDER — FENTANYL CITRATE 0.05 MG/ML IJ SOLN
25.0000 ug | INTRAMUSCULAR | Status: DC | PRN
  Administered 2014-02-19 (×3): 50 ug via INTRAVENOUS

## 2014-02-19 MED ORDER — HYDROCODONE-ACETAMINOPHEN 10-325 MG PO TABS: 1.0000 | ORAL_TABLET | Freq: Four times a day (QID) | ORAL | Status: DC | PRN

## 2014-02-19 MED ORDER — LIDOCAINE-EPINEPHRINE 1 %-1:100000 IJ SOLN
INTRAMUSCULAR | Status: AC
  Filled 2014-02-19: qty 1

## 2014-02-19 MED ORDER — MIDAZOLAM HCL 2 MG/2ML IJ SOLN
INTRAMUSCULAR | Status: AC
  Filled 2014-02-19: qty 2

## 2014-02-19 MED ORDER — LIDOCAINE-EPINEPHRINE 1 %-1:100000 IJ SOLN
INTRAMUSCULAR | Status: DC | PRN
  Administered 2014-02-19: 20 mL

## 2014-02-19 MED ORDER — PROPOFOL 10 MG/ML IV BOLUS
INTRAVENOUS | Status: DC | PRN
  Administered 2014-02-19: 200 mg via INTRAVENOUS

## 2014-02-19 MED ORDER — PROPOFOL 10 MG/ML IV BOLUS
INTRAVENOUS | Status: AC
  Filled 2014-02-19: qty 20

## 2014-02-19 MED ORDER — MEPERIDINE HCL 25 MG/ML IJ SOLN: 6.2500 mg | INTRAMUSCULAR | Status: DC | PRN

## 2014-02-19 MED ORDER — 0.9 % SODIUM CHLORIDE (POUR BTL) OPTIME
TOPICAL | Status: DC | PRN
  Administered 2014-02-19: 1000 mL

## 2014-02-19 MED ORDER — CEFAZOLIN SODIUM 1 G IJ SOLR
500.0000 mg | Freq: Three times a day (TID) | INTRAMUSCULAR | Status: AC
  Administered 2014-02-19 – 2014-02-20 (×3): 500 mg via INTRAVENOUS
  Filled 2014-02-19 (×4): qty 5

## 2014-02-19 MED ORDER — WARFARIN SODIUM 5 MG PO TABS
5.0000 mg | ORAL_TABLET | ORAL | Status: DC
  Filled 2014-02-19: qty 1

## 2014-02-19 MED ORDER — OXYMETAZOLINE HCL 0.05 % NA SOLN
NASAL | Status: DC | PRN
  Administered 2014-02-19: 1 via NASAL

## 2014-02-19 MED ORDER — KETAMINE HCL 100 MG/ML IJ SOLN
INTRAMUSCULAR | Status: DC | PRN
  Administered 2014-02-19: 30 mg via INTRAVENOUS

## 2014-02-19 MED ORDER — MORPHINE SULFATE 2 MG/ML IJ SOLN: 1.0000 mg | INTRAMUSCULAR | Status: DC | PRN

## 2014-02-19 MED ORDER — INSULIN ASPART 100 UNIT/ML ~~LOC~~ SOLN: 0.0000 [IU] | Freq: Three times a day (TID) | SUBCUTANEOUS | Status: DC

## 2014-02-19 MED ORDER — ONDANSETRON HCL 4 MG/2ML IJ SOLN: 4.0000 mg | INTRAMUSCULAR | Status: DC | PRN

## 2014-02-19 MED ORDER — LACTATED RINGERS IV SOLN
INTRAVENOUS | Status: DC
  Administered 2014-02-19: 100 mL via INTRAVENOUS
  Administered 2014-02-20: 04:00:00 via INTRAVENOUS

## 2014-02-19 MED ORDER — METFORMIN HCL 500 MG PO TABS
500.0000 mg | ORAL_TABLET | Freq: Two times a day (BID) | ORAL | Status: DC
  Administered 2014-02-19 – 2014-02-20 (×2): 500 mg via ORAL
  Filled 2014-02-19 (×4): qty 1

## 2014-02-19 MED ORDER — OXYCODONE-ACETAMINOPHEN 5-325 MG PO TABS
1.0000 | ORAL_TABLET | ORAL | Status: DC | PRN
  Administered 2014-02-19: 1 via ORAL
  Administered 2014-02-20: 2 via ORAL
  Filled 2014-02-19: qty 2
  Filled 2014-02-19: qty 1

## 2014-02-19 MED ORDER — OXYCODONE-ACETAMINOPHEN 10-325 MG PO TABS: 1.0000 | ORAL_TABLET | Freq: Four times a day (QID) | ORAL | Status: DC | PRN

## 2014-02-19 MED ORDER — GLIMEPIRIDE 1 MG PO TABS
1.0000 mg | ORAL_TABLET | Freq: Every day | ORAL | Status: DC
  Administered 2014-02-20: 1 mg via ORAL
  Filled 2014-02-19 (×2): qty 1

## 2014-02-19 MED ORDER — KETAMINE HCL 100 MG/ML IJ SOLN
INTRAMUSCULAR | Status: AC
  Filled 2014-02-19: qty 1

## 2014-02-19 MED ORDER — DILTIAZEM HCL 30 MG PO TABS: 15.0000 mg | ORAL_TABLET | Freq: Two times a day (BID) | ORAL | Status: DC | PRN

## 2014-02-19 MED ORDER — LACTATED RINGERS IV SOLN
INTRAVENOUS | Status: DC
  Administered 2014-02-19: 09:00:00 via INTRAVENOUS

## 2014-02-19 MED ORDER — BISOPROLOL FUMARATE 10 MG PO TABS
10.0000 mg | ORAL_TABLET | Freq: Every day | ORAL | Status: DC
  Administered 2014-02-20: 10 mg via ORAL
  Filled 2014-02-19: qty 1

## 2014-02-19 MED ORDER — OXYMETAZOLINE HCL 0.05 % NA SOLN
2.0000 | NASAL | Status: DC
  Administered 2014-02-19: 2 via NASAL
  Filled 2014-02-19: qty 15

## 2014-02-19 MED ORDER — GLYCOPYRROLATE 0.2 MG/ML IJ SOLN
INTRAMUSCULAR | Status: DC | PRN
  Administered 2014-02-19: .8 mg via INTRAVENOUS

## 2014-02-19 MED ORDER — WARFARIN SODIUM 5 MG PO TABS: 5.0000 mg | ORAL_TABLET | Freq: Every day | ORAL | Status: DC

## 2014-02-19 MED ORDER — PRAVASTATIN SODIUM 80 MG PO TABS
80.0000 mg | ORAL_TABLET | Freq: Every day | ORAL | Status: DC
  Administered 2014-02-19: 80 mg via ORAL
  Filled 2014-02-19 (×2): qty 1

## 2014-02-19 MED ORDER — BACITRACIN ZINC 500 UNIT/GM EX OINT
TOPICAL_OINTMENT | CUTANEOUS | Status: AC
  Filled 2014-02-19: qty 15

## 2014-02-19 MED ORDER — DEXAMETHASONE SODIUM PHOSPHATE 10 MG/ML IJ SOLN
INTRAMUSCULAR | Status: DC | PRN
  Administered 2014-02-19: 4 mg via INTRAVENOUS

## 2014-02-19 MED ORDER — OXYCODONE HCL 5 MG PO TABS: 5.0000 mg | ORAL_TABLET | Freq: Four times a day (QID) | ORAL | Status: DC | PRN

## 2014-02-19 MED ORDER — CEFAZOLIN SODIUM-DEXTROSE 2-3 GM-% IV SOLR
2.0000 g | INTRAVENOUS | Status: AC
  Administered 2014-02-19: 2 g via INTRAVENOUS
  Filled 2014-02-19: qty 50

## 2014-02-19 MED ORDER — WARFARIN SODIUM 7.5 MG PO TABS
7.5000 mg | ORAL_TABLET | ORAL | Status: DC
  Administered 2014-02-19: 7.5 mg via ORAL
  Filled 2014-02-19: qty 1

## 2014-02-19 MED ORDER — ONDANSETRON HCL 4 MG/2ML IJ SOLN
INTRAMUSCULAR | Status: DC | PRN
  Administered 2014-02-19: 4 mg via INTRAVENOUS

## 2014-02-19 MED ORDER — ROCURONIUM BROMIDE 100 MG/10ML IV SOLN
INTRAVENOUS | Status: DC | PRN
  Administered 2014-02-19: 40 mg via INTRAVENOUS

## 2014-02-19 MED ORDER — SUCCINYLCHOLINE CHLORIDE 20 MG/ML IJ SOLN
INTRAMUSCULAR | Status: DC | PRN
  Administered 2014-02-19: 140 mg via INTRAVENOUS

## 2014-02-19 MED ORDER — MIDAZOLAM HCL 5 MG/5ML IJ SOLN
INTRAMUSCULAR | Status: DC | PRN
  Administered 2014-02-19: 0.5 mg via INTRAVENOUS

## 2014-02-19 MED ORDER — INSULIN ASPART 100 UNIT/ML ~~LOC~~ SOLN: 4.0000 [IU] | Freq: Three times a day (TID) | SUBCUTANEOUS | Status: DC

## 2014-02-19 MED ORDER — PROMETHAZINE HCL 25 MG/ML IJ SOLN: 6.2500 mg | INTRAMUSCULAR | Status: DC | PRN

## 2014-02-19 MED ORDER — ARTIFICIAL TEARS OP OINT
TOPICAL_OINTMENT | OPHTHALMIC | Status: DC | PRN
  Administered 2014-02-19: 1 via OPHTHALMIC

## 2014-02-19 MED ORDER — WARFARIN - PHYSICIAN DOSING INPATIENT
Freq: Every day | Status: DC
Start: 2014-02-19 — End: 2014-02-20

## 2014-02-19 MED ORDER — DEXMEDETOMIDINE HCL IN NACL 200 MCG/50ML IV SOLN: INTRAVENOUS | Status: DC | PRN

## 2014-02-19 MED ORDER — PANTOPRAZOLE SODIUM 40 MG PO TBEC
40.0000 mg | DELAYED_RELEASE_TABLET | Freq: Every day | ORAL | Status: DC
  Administered 2014-02-20: 40 mg via ORAL
  Filled 2014-02-19: qty 1

## 2014-02-19 SURGICAL SUPPLY — 38 items
ATTRACTOMAT 16X20 MAGNETIC DRP (DRAPES) ×3 IMPLANT
BLADE TRICUT ROTATE M4 4 5PK (BLADE) IMPLANT
BLADE TRICUT ROTATE M4 4MM 5PK (BLADE)
CANISTER SUCTION 2500CC (MISCELLANEOUS) ×3 IMPLANT
COAGULATOR SUCT 6 FR SWTCH (ELECTROSURGICAL) ×1
COAGULATOR SUCT SWTCH 10FR 6 (ELECTROSURGICAL) ×2 IMPLANT
COTTONBALL LRG STERILE PKG (GAUZE/BANDAGES/DRESSINGS) IMPLANT
CRADLE DONUT ADULT HEAD (MISCELLANEOUS) IMPLANT
DRESSING TELFA 8X3 (GAUZE/BANDAGES/DRESSINGS) ×3 IMPLANT
DRSG NASOPORE 8CM (GAUZE/BANDAGES/DRESSINGS) IMPLANT
ELECT REM PT RETURN 9FT ADLT (ELECTROSURGICAL) ×3
ELECTRODE REM PT RTRN 9FT ADLT (ELECTROSURGICAL) ×1 IMPLANT
FILTER ARTHROSCOPY CONVERTOR (FILTER) IMPLANT
GAUZE PACKING FOLDED 2  STR (GAUZE/BANDAGES/DRESSINGS) ×2
GAUZE PACKING FOLDED 2 STR (GAUZE/BANDAGES/DRESSINGS) ×1 IMPLANT
GAUZE SPONGE 2X2 8PLY STRL LF (GAUZE/BANDAGES/DRESSINGS) IMPLANT
GEL ULTRASOUND 20GR AQUASONIC (MISCELLANEOUS) ×3 IMPLANT
GLOVE ECLIPSE 8.0 STRL XLNG CF (GLOVE) ×6 IMPLANT
GLOVE SURG SS PI 6.5 STRL IVOR (GLOVE) ×3 IMPLANT
GOWN STRL REUS W/ TWL LRG LVL3 (GOWN DISPOSABLE) ×1 IMPLANT
GOWN STRL REUS W/ TWL XL LVL3 (GOWN DISPOSABLE) ×1 IMPLANT
GOWN STRL REUS W/TWL LRG LVL3 (GOWN DISPOSABLE) ×2
GOWN STRL REUS W/TWL XL LVL3 (GOWN DISPOSABLE) ×2
KIT BASIN OR (CUSTOM PROCEDURE TRAY) ×3 IMPLANT
KIT ROOM TURNOVER OR (KITS) ×3 IMPLANT
NEEDLE SPNL 25GX3.5 QUINCKE BL (NEEDLE) ×3 IMPLANT
NS IRRIG 1000ML POUR BTL (IV SOLUTION) ×3 IMPLANT
PAD ARMBOARD 7.5X6 YLW CONV (MISCELLANEOUS) ×6 IMPLANT
PATTIES SURGICAL .5 X3 (DISPOSABLE) ×3 IMPLANT
SHEET SIL 040 (INSTRUMENTS) ×3 IMPLANT
SPECIMEN JAR SMALL (MISCELLANEOUS) IMPLANT
SPONGE GAUZE 2X2 STER 10/PKG (GAUZE/BANDAGES/DRESSINGS)
SUT CHROMIC 4 0 P 3 18 (SUTURE) ×3 IMPLANT
SUT ETHILON 3 0 PS 1 (SUTURE) ×3 IMPLANT
SUT PDS AB 4-0 P3 18 (SUTURE) ×3 IMPLANT
SUT PLAIN 4 0 ~~LOC~~ 1 (SUTURE) IMPLANT
TRAY ENT MC OR (CUSTOM PROCEDURE TRAY) ×3 IMPLANT
WATER STERILE IRR 1000ML POUR (IV SOLUTION) ×3 IMPLANT

## 2014-02-19 NOTE — Anesthesia Postprocedure Evaluation (Signed)
  Anesthesia Post-op Note  Patient: [name]  Procedure(s) Performed: Procedure(s): NASAL SEPTOPLASTY WITH BILATERAL TURBINATE REDUCTION (Bilateral)  Patient Location: PACU  Anesthesia Type:General  Level of Consciousness: awake, alert  and oriented  Airway and Oxygen Therapy: Patient Spontanous Breathing and Patient connected to nasal cannula oxygen  Post-op Pain: mild  Post-op Assessment: Post-op Vital signs reviewed, Patient's Cardiovascular Status Stable and Respiratory Function Stable  Post-op Vital Signs: Reviewed and stable  Last Vitals:  Filed Vitals:   02/19/14 0816  BP: 132/69  Pulse: 68  Temp: 36.8 C  Resp: 20    Complications: No apparent anesthesia complications

## 2014-02-19 NOTE — Transfer of Care (Signed)
Immediate Anesthesia Transfer of Care Note  Patient: [name]  Procedure(s) Performed: Procedure(s): NASAL SEPTOPLASTY WITH BILATERAL TURBINATE REDUCTION (Bilateral)  Patient Location: PACU  Anesthesia Type:General  Level of Consciousness: awake and patient cooperative  Airway & Oxygen Therapy: Patient Spontanous Breathing and Patient connected to face mask oxygen  Post-op Assessment: Report given to PACU RN, Post -op Vital signs reviewed and stable and Patient moving all extremities  Post vital signs: Reviewed and stable  Complications: No apparent anesthesia complications

## 2014-02-19 NOTE — Op Note (Signed)
02/19/2014  12:43 PM    Minerva Ends  [id number]   Pre-Op Dx:  Deviated Nasal Septum, Hypertrophic Inferior Turbinates, obstructive sleep apnea  Post-op Dx: Same  Proc: Nasal Septoplasty, Bilateral SMR Inferior Turbinates   Surg:  Jodi Marble T MD  Anes:  GOT  EBL:  Minimal  Comp:  None  Findings:  Overall very narrow nose with a rightward septal deviation and poor right airway. Moderately enlarged inferior turbinates bilateral.  Procedure: With the patient in a comfortable supine position,  general orotracheal anesthesia was induced without difficulty.     The patient received preoperative Afrin spray for topical decongestion and vasoconstriction.  Intravenous prophylactic antibiotics were administered.  At an appropriate level, the patient was placed in a semi-sitting position.  A saline moistened throat pack was placed.  Nasal vibrissae were trimmed.  Afrin solution was applied on 0.5" x 3" cottonoids to both sides of the septal mucosa.   1% Xylocaine with 1:100,000 epinephrine, 10 cc's, was infiltrated into the anterior floor of the nose, into the nasal spine region, into the membranous columella, and finally into the submucoperichondrial plane of the septum on both sides.  Several minutes were allowed for this to take effect.  A sterile preparation and draping of the midface was accomplished in the standard fashion.  The materials were removed from the nose and observed to be intact and correct in number.  The nose was inspected with a headlight with the findings as described above.  A left hemitransfixion incision was sharply executed and carried down to the caudal edge of the quadrangular cartilage and continued to a floor incision.  An opposite small floor incision was sharply executed as well.   Floor tunnels were elevated on both sides, carried posteriorly, then medially, then brought forward along the vomer and maxillary crest.  The submucoperichondrial plane of the   left septum was dissected up to the dorsum of the nose, back onto the perpendicular plate, and brought down and communicated with a floor tunnel and then forward along the maxillary crest.  The flap was generated intact.  The chondroethmoid junction was identified and opened with a Psychologist, educational.  The opposite submucoperiosteal plane of the perpendicular plate of the ethmoid  was elevated and carried down to the floor tunnel posteriorly.  The superior perpendicular plate was lysed with an open Jansen-Middleton forceps.  The inferior portion was dissected from the maxillary crest and vomer with a Cottle elevator.  The midportion was rocked free with a closed [employer] forceps and then delivered.    The posterior inferior corner of the quadrangular cartilage was submucosally resected, including a cartilaginous tail up along the vomer.     The maxillary crest posterior to the caudal strut was lowered using mallet and osteotome. 1-2 mm of the inferior strut was submucosally resected to allow the septum to swing into the midline. The septum was freed from the upper lateral cartilages sharply on both sides. It was secured to the nasal spine with a figure-of-eight 4-0 PDS suture. The septal tunnel was suctioned free. A posterior rent on the right flap was noted. The anterior incisions were closed with interrupted 4-0 chromic suture.   Just prior to completing the septoplasty, the inferior turbinates were each infiltrated with additional 1% Xylocaine with 1:100,000 epinephrine,  6 cc's total.  Upon completing the septoplasty, beginning on the RIGHT side, the inferior turbinate was inspected and infractured.  The anterior hood of the inferior turbinate was sharply lysed just  behind the nasal valve.  The medial mucosa of the inferior turbinate was incised in an  anterior upsloping fashion and a laterally based flap was developed from the turbinate bone. The anterior pole of the turbinate was right at the  pyriform aperture which was submucosally dissected and partially removed using an open Jansen-Middleton forceps.   Using angled turbinate scissors, turbinate bone and lateral mucosa were resected in a posterior downsloping fashion, taking much of the anterior pole and leaving most of the posterior pole.  Bony spicules were submucosally dissected and removed.  The mucosal flap was laid back down and the turbinate was outfractured.  This completed one SMR inferior turbinate.  The opposite side was performed in identical fashion.  Suction cautery was used to coagulate the cut mucosal edges and to reduce the bulbous posterior turbinate.  Again hemostasis was observed.  After completing both turbinate resections, 0.040" reinforced Silastic splints were fashioned, placed against the nasal septum for support, and secured thereto with a 3-0 Ethilon stitch.   Telfa packs impregnated with bacitracin ointment were placed between the septum and the inferior turbinates, one on each side, for hemostasis and support.  A 6.0 mm nasal trumpet was shortened and placed in each side to allow a minimal postoperative nasal airway.   At this point the procedure was completed.  The pharynx was suctioned free and the throat pack was removed.   The patient was returned to anesthesia, awakened, extubated, and transferred to recovery in stable condition.  Dispo:   PACU to 23 hour observation given sleep apnea.  Plan: Ice, elevation, narcotic analgesia, prophylactic antibiotics for the duration of indwelling nasal foreign bodies.  We will remove the nasal packing In one day, the septal splints in 10 days.  Return to work or school in 10 days, strenuous activities in two weeks.  Tyson Alias MD

## 2014-02-19 NOTE — Interval H&P Note (Signed)
History and Physical Interval Note:  02/19/2014 9:46 AM  [name]  has presented today for surgery, with the diagnosis of [place]   The various methods of treatment have been discussed with the patient and family. After consideration of risks, benefits and other options for treatment, the patient has consented to  Procedure(s): NASAL SEPTOPLASTY WITH BILATERAL TURBINATE REDUCTION (Bilateral) as a surgical intervention .  The patient's history has been re-reviewed, patient re-examined, no change in status, stable for surgery.  I have re-reviewed the patient's chart and labs.  Questions were answered to the patient's satisfaction.     Jodi Marble

## 2014-02-19 NOTE — Anesthesia Preprocedure Evaluation (Addendum)
Anesthesia Evaluation  Patient identified by MRN, date of birth, ID band Patient awake    Reviewed: Allergy & Precautions, H&P , NPO status , Patient's Chart, lab work & pertinent test results  Airway Mallampati: III TM Distance: >3 FB Neck ROM: Full    Dental  (+) Edentulous Upper, Edentulous Lower   Pulmonary asthma , sleep apnea , COPD COPD inhaler, Current Smoker,  OSA contributing to Atrial arythmia, cardiologist aware of and encouraging NSR to help with OSA breath sounds clear to auscultation        Cardiovascular hypertension, Pt. on medications Rhythm:Irregular Rate:Normal  ECHO 6/15 EF 65% LVH, Grade II diastolic DF, Stress 5409 LOW RISK, coumadin to be held prior to this surgery   Neuro/Psych    GI/Hepatic GERD-  Medicated,  Endo/Other  diabetes, Poorly Controlled, Type 2, Oral Hypoglycemic Agents  Renal/GU      Musculoskeletal   Abdominal (+) + obese,   Peds  Hematology   Anesthesia Other Findings Anterior   hx of ACDF; limited neck extension  Reproductive/Obstetrics                        Anesthesia Physical Anesthesia Plan  ASA: III  Anesthesia Plan: General   Post-op Pain Management:    Induction: Intravenous  Airway Management Planned: Oral ETT and Video Laryngoscope Planned  Additional Equipment:   Intra-op Plan:   Post-operative Plan: Extubation in OR  Informed Consent: I have reviewed the patients History and Physical, chart, labs and discussed the procedure including the risks, benefits and alternatives for the proposed anesthesia with the patient or authorized representative who has indicated his/her understanding and acceptance.     Plan Discussed with:   Anesthesia Plan Comments: (Multinodal pain RX (Ketamine 30mg , Tyelenol, Precedex, Decadron, Lidocaine) light narcotics, check coags this am.  Expect difficult intubation, Glide)       Anesthesia Quick  Evaluation

## 2014-02-19 NOTE — H&P (View-Only) (Signed)
[name], Herder 58 y.o., male [id number]     Chief Complaint: nasal obstruction  HPI: 58 year old white male comes in for evaluation of nasal obstruction and sleep apnea.  He is disabled on a comeback issues and cardiac arrhythmias.  he never feels like his nose breathes well.  He has known sleep apnea, even preferring to sleep on his side.  He has had some degree of chronic rhinorrhea, clear, for 20 or more years.  He smoked 2 packs per day for many years, and is now back to one half pack per day over the past 2 years.  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  TSH was 1.5 19 Sep 2013.   He does describe some tinnitus in both ears.  Hearing seems basically okay.   He has a history of atrial fibrillation and has had one prior  ablation.  He is currently on Coumadin.  I spoke with Dr. [name] today.   He thinks he is okay to undergo nasal surgery.  we will inform the patient and  begin making plans.  I spoke with Dr. Thompson Grayer.  The patient has intermittent A. fib.  He is on Coumadin.  Stroke risk factors include hypertension and smoking.  He had a catheterization with no intrinsic cardiac disease and good ejection fraction.  We will stop his Coumadin 5 days ahead of surgery and then restart immediately afterwards.  We will talk with Dr. [name] before going ahead with scheduling.  Preoperative visit.  We have received clearance from his primary physician and from Dr. Rayann Heman.  He will stop his Coumadin 5 days before surgery.  He already has some hydrocodone pain medicine at home.  He continues to smoke less than one quarter pack per day and has one caffeinated cola  beverage.     We're planning septoplasty and reduction of turbinates to assist in general breathing but also to help with snoring.  He has sleep apnea but is not wearing his CPAP.  We probably  need to go back to CPAP when we establish good nasal patency.   I discussed the surgery in detail including risks and complications.  Rectums were answered and informed consent obtained.  Prescriptions for Keflex and oxycodone written and given.  I discussed nasal hygiene measures, and advancement of diet and activity.  We will remove the packs in the hospital before he goes home, and the septal splints 10 days thereafter.  PMH: Past Medical History  Diagnosis Date  . HLD (hyperlipidemia)     takes Pravastatin daily  . Obesity   . Tobacco abuse   . Overdose 2009    unintentional Flacanide overdose  . HTN (hypertension)     takes Prinizide daily  . Dyspnea     LHC 11/08: EF 60%, normal coronary arteries;  ETT-echo 2/11: normal at 71% PMHR  . Shortness of breath     lying/sitting/exertion  . Seasonal allergies     was on Claritin-stopped taking this pt states it wasn't helping  . Coughing   . History of bronchitis 63yrs ago  . COPD (chronic obstructive pulmonary disease)   . Emphysema   . Peripheral edema     takes Furosemide as needed  . Hypokalemia     hx of;pt states he was taking K+ but now eats a banana daily instead  . Dizziness   .  Short-term memory loss   . Arthritis     lower back  . Chronic back pain     DDD/stenosis  . Bruises easily     takes Coumadin daily  . GERD (gastroesophageal reflux disease)     takes Omeprazole bid  . Colon polyps     9 polyps removed 10/13/11  . Urinary frequency   . Urinary urgency   . Diabetes mellitus     takes Metformin and Glimepiride daily  . History of shingles   . Atrial flutter     s/p CTI ablation by Dr Rayann Heman  . Paroxysmal atrial fibrillation   . Pneumonia 3-4 yrs ago    none recent  . OSA (obstructive sleep apnea)     not always using cpap  . Brain aneurysm 2009    questionable. A follow up CTA in 2009 showed no evidence of  . Peripheral neuropathy   . Complication of anesthesia September 11, 2012    slow to awaken  after ablation    Surg Hx: Past Surgical History  Procedure Laterality Date  . Neck surgery  78yrs ago  . Right shoulder surgery  4-76yrs ago    cyst removed  . Left inguinal hernia repair      as a child  . Tee without cardioversion  05/05/2011    Procedure: TRANSESOPHAGEAL ECHOCARDIOGRAM (TEE);  Surgeon: Lelon Perla, MD;  Location: [facility] ENDOSCOPY;  Service: Cardiovascular;  Laterality: N/A;  . Cardioversion  05/05/2011    Procedure: CARDIOVERSION;  Surgeon: Lelon Perla, MD;  Location: [facility] ENDOSCOPY;  Service: Cardiovascular;  Laterality: N/A;  . Throat surgery  4-61yrs ago    "thought " it was cancer but came back not  . Carpal tunnel release  99/2000    bilateral  . Appendectomy  2-14yrs ago  . Knee surgery  6-50yrs ago    left  . Cardiac catheterization  2008    no significant CAD  . Tonsillectomy    . Hernia repair    . Anterior cervical decomp/discectomy fusion  01/04/2012    Procedure: ANTERIOR CERVICAL DECOMPRESSION/DISCECTOMY FUSION 1 LEVEL/HARDWARE REMOVAL;  Surgeon: Ophelia Charter, MD;  Location: [place] NEURO ORS;  Service: Neurosurgery;  Laterality: N/A;  explore cervical fusion Cervical six - seven  with removal of codman plate anterior cervical decompression with fusion interbody prothesis plating and bonegraft  . Cardioversion Bilateral 07/26/2012    Procedure: CARDIOVERSION;  Surgeon: Minus Breeding, MD;  Location: [place];  Service: Cardiovascular;  Laterality: Bilateral;  . Atrial flutter ablation  09/11/2012    CTI ablation by Dr Rayann Heman  . Colonoscopy with propofol N/A 12/13/2012    Procedure: COLONOSCOPY WITH PROPOFOL;  Surgeon: Milus Banister, MD;  Location: [facility] ENDOSCOPY;  Service: Endoscopy;  Laterality: N/A;    FHx:   Family History  Problem Relation Age of Onset  . Colon cancer Neg Hx   . Anesthesia problems Neg Hx   . Hypotension Neg Hx   . Malignant hyperthermia Neg Hx   . Pseudochol deficiency Neg Hx    SocHx:  reports that he has been  smoking Cigarettes.  He has a 12 pack-year smoking history. He has never used smokeless tobacco. He reports that he drinks alcohol. He reports that he does not use illicit drugs.  ALLERGIES:  Allergies  Allergen Reactions  . Adhesive [Tape]     itching  . Latex Itching    When tape is on the skin too long     (  Not in a hospital admission)  No results found for this or any previous visit (from the past 48 hour(s)). No results found.  ZLD:JTTSVXBL: Feeling tired (fatigue).  No fever.  Night sweats.  No recent weight loss. Head: No headache. Eyes: No eye symptoms. Otolaryngeal: No hearing loss  and no earache.  Tinnitus.  No purulent nasal discharge.  Nasal passage blockage (stuffiness)  and snoring.  No sneezing, no hoarseness, and no sore throat. Cardiovascular: No chest pain or discomfort  and no palpitations. Pulmonary: Dyspnea  and cough.  No wheezing. Gastrointestinal: No dysphagia.  Heartburn.  No nausea, no abdominal pain, and no melena.  No diarrhea. Genitourinary: No dysuria. Endocrine: Muscle weakness. Musculoskeletal: No calf muscle cramps, no arthralgias, and no soft tissue swelling. Neurological: No dizziness  and no fainting.  Tingling  and numbness. Psychological: No anxiety  and no depression. Skin: No rash  BP:160/78,  HR: 74 b/min,  Height: 6 ft 3 in, Weight: 254 lb , BMI: 31.7 kg/m2,   PHYSICAL EXAM: He smells of tobacco smoke.  He is breathing mostly through his mouth.  Again, he has a very narrow hard palate.  the internal nose is quite narrow with some mucosal excoriation and crusting consistent with his smoking.     Lungs: Clear to auscultation Heart: Regular rate and rhythm without murmurs Abdomen: Soft, active Extremities: Normal configuration Neurologic: Symmetric, grossly intact.   Assessment/Plan Deviated nasal septum (470) (J34.2). Obstructive sleep apnea (327.23) (G47.33). Hypertrophy of nasal turbinates (478.0) (J34.3).  We are doing your  surgery next week.  you will stay overnight in the hospital one night.  I am leaving you prescription for oxycodone strong narcotic pain medication, and Keflex antibiotics. you  already have some hydrocodone which is medium strength narcotic pain medication.  After we pull the packs out of your nose, you need to start using  nasal hygiene measures frequently.  Recheck here 10 days after surgery.  No strenuous activities for 2 full weeks.  Oxycodone-Acetaminophen 5-325 MG Oral Tablet;TAKE 1 TO 2 TABLETS EVERY 4 TO 6 HOURS AS NEEDED FOR PAIN; Qty30; R0; Rx. Cephalexin 500 MG Oral Capsule;TAKE 1 CAPSULE 4 TIMES DAILY; TJQ30; R0; Rx.  Erik Obey, Azaliyah Kennard 01/15/3299, 5:57 PM

## 2014-02-19 NOTE — Anesthesia Postprocedure Evaluation (Signed)
  Anesthesia Post-op Note  Patient: [name]  Procedure(s) Performed: Procedure(s): NASAL SEPTOPLASTY WITH BILATERAL TURBINATE REDUCTION (Bilateral)  Patient Location: PACU  Anesthesia Type:General  Level of Consciousness: awake, alert  and oriented  Airway and Oxygen Therapy: Patient Spontanous Breathing  Post-op Pain: moderate  Post-op Assessment: Post-op Vital signs reviewed  Post-op Vital Signs: Reviewed and stable  Last Vitals:  Filed Vitals:   02/19/14 1249  BP: 138/69  Pulse: 73  Temp: 36.6 C  Resp: 16    Complications: No apparent anesthesia complications

## 2014-02-19 NOTE — Discharge Instructions (Signed)
Nasal Septal Reconstruction Nasal septal reconstruction or nasal septoplasty is a procedure to straighten the nasal septum. The nasal septum is a wall that separates the two nostrils and nasal passages. It is slightly off center in most people. If the septum is severely deviated, it may result in problems, such as difficulty in breathing through the nose. The bend in the septum may be present at birth or could be due to an injury. This procedure is done if you have any of the following problems.  Deviation of the nasal septum.  Repeated infection of the sinuses (air-filled cavities in the skull).  Pain due to the deviated septum.  Loss of smell due to the deviated septum.  A blood clot in the septum that interferes your breathing.  Frequent nosebleeds. If the outside of the nose is bent, it may have to be reconstructed by a surgery called rhinoplasty. Sometimes, this procedure may be combined with septoplasty. LET YOUR CAREGIVER KNOW ABOUT:   Allergies.  Previous problems with anesthetics.  History of bleeding or blood problems.  Any medicines that you are currently taking. RISKS AND COMPLICATIONS  You may have a hole in the septum.  You may have a collection of blood in the septum.  You may develop loss of sensation in the upper lip or teeth.  You may develop an infection.  You may have bleeding.  The front portion of your nose may become flatter than what it was before the procedure.  You may develop a reaction to the anesthetic used.  You may have a recurrence of the nasal obstruction. BEFORE THE PROCEDURE   Follow the instructions given by your caregiver.  Your caregiver may recommend x-ray and blood tests.  Your caregiver may advise you to stop smoking for at least 2 weeks before the procedure.  Your caregiver may advise you to stop taking aspirin and anti-inflammatory medications such as ibuprofen, 10 days before surgery, as these medicines can cause  bleeding. If the surgery is going to be under general anesthesia:  You may be advised to eat only a light meal, such as soup or salad the previous night.  You will be advised to avoid eating or drinking anything after midnight and also in the morning of the procedure. PROCEDURE  If the procedure is being done under general anesthesia, you may be put to sleep. You will not feel the pain. You will not be aware of the procedure. It can also be done under local anesthesia with sedation where the area of the surgery is numbed. The surgeon then makes a cut on the inner lining of the septum. If there is a blood clot, it is drained. The bone and cartilage of the septum are reshaped. The straightened septum is held in place using plastic sheets or splints. Your nose is then packed with gauze to control the bleeding. The procedure may take one to one and a half hours. It generally does not cause bruising or black eyes. AFTER THE PROCEDURE   You may be kept in the recovery room till the effect of the anesthesia wears off.  You may be then brought to your room in the hospital.  You may be asked to breathe through your mouth.  Your nose packing may need to stay in place for 3 to 4 days.  You may be given medicines for discomfort and nausea.  You may be given antibiotics.  You may be allowed to go home on the same day or have to  stay in the hospital for a few days. HOME CARE INSTRUCTIONS   Do not blow your nose.  Avoid doing heavy work and strenuous exercise for at least one week after the procedure.  Avoid pushing or moving your nose before it heals.  Avoid lifting weight and bending forwards.  Avoid using products that contain aspirin.  Keep your head raised while lying down.  Take the medicines as instructed by your caregiver.  Inform your caregiver if you have any problems after taking your medicine. SEEK MEDICAL CARE IF:   You have a new symptom.  You have doubts regarding the  procedure or its outcome. SEEK IMMEDIATE MEDICAL CARE IF:   You develop fever over 102 F (38.9 C).  You have severe difficulty in breathing.  Your nose continues to bleed even after you keep your head raised and apply ice to your forehead and nose for 10 to 15 minutes. Document Released: 08/09/2007 Document Revised: 07/25/2011 Document Reviewed: 08/09/2007 [facility] Patient Information 2015 [place], [place]. This information is not intended to replace advice given to you by your health care provider. Make sure you discuss any questions you have with your health care provider.

## 2014-02-19 NOTE — Anesthesia Procedure Notes (Signed)
Procedure Name: Intubation Date/Time: 02/19/2014 10:52 AM Performed by: Terrill Mohr Pre-anesthesia Checklist: Patient identified, Emergency Drugs available, Suction available and Patient being monitored Patient Re-evaluated:Patient Re-evaluated prior to inductionOxygen Delivery Method: Circle system utilized Preoxygenation: Pre-oxygenation with 100% oxygen Intubation Type: IV induction Ventilation: Mask ventilation without difficulty Laryngoscope Size: Mac and 4 Grade View: Grade II Tube type: Oral Tube size: 7.5 mm Number of attempts: 1 Airway Equipment and Method: Stylet Placement Confirmation: ETT inserted through vocal cords under direct vision,  breath sounds checked- equal and bilateral and positive ETCO2 Secured at: 21 (cm at upper gum) cm Tube secured with: Tape Dental Injury: Teeth and Oropharynx as per pre-operative assessment

## 2014-02-20 ENCOUNTER — Encounter (HOSPITAL_COMMUNITY): Payer: Self-pay | Admitting: Otolaryngology

## 2014-02-20 DIAGNOSIS — J342 Deviated nasal septum: Secondary | ICD-10-CM | POA: Diagnosis not present

## 2014-02-20 LAB — PROTIME-INR
INR: 1.1 (ref 0.00–1.49)
Prothrombin Time: 14.2 seconds (ref 11.6–15.2)

## 2014-02-20 LAB — GLUCOSE, CAPILLARY: Glucose-Capillary: 173 mg/dL — ABNORMAL HIGH (ref 70–99)

## 2014-02-20 MED ORDER — OXYCODONE-ACETAMINOPHEN 5-325 MG PO TABS: 1.0000 | ORAL_TABLET | ORAL | Status: DC | PRN

## 2014-02-20 NOTE — Discharge Summary (Signed)
02/20/2014 9:58 AM  Minerva Ends [id number]  Post-Op Day 1    Temp:  [97.9 F (36.6 C)-98.8 F (37.1 C)] 98 F (36.7 C) (10/08 0734) Pulse Rate:  [58-74] 69 (10/08 0420) Resp:  [10-20] 15 (10/08 0022) BP: (138-173)/(65-82) 153/76 mmHg (10/08 0420) SpO2:  [91 %-97 %] 92 % (10/08 0420),     Intake/Output Summary (Last 24 hours) at 02/20/14 0958 Last data filed at 02/20/14 0800  Gross per 24 hour  Intake 3578.33 ml  Output   2550 ml  Net 1028.33 ml    Results for orders placed during the hospital encounter of 02/19/14 (from the past 24 hour(s))  GLUCOSE, CAPILLARY     Status: Abnormal   Collection Time    02/19/14 10:11 AM      Result Value Ref Range   Glucose-Capillary 190 (*) 70 - 99 mg/dL   Comment 1 Documented in Chart     Comment 2 Notify RN    GLUCOSE, CAPILLARY     Status: Abnormal   Collection Time    02/19/14 12:56 PM      Result Value Ref Range   Glucose-Capillary 217 (*) 70 - 99 mg/dL  GLUCOSE, CAPILLARY     Status: Abnormal   Collection Time    02/19/14  2:39 PM      Result Value Ref Range   Glucose-Capillary 247 (*) 70 - 99 mg/dL   Comment 1 Documented in Chart     Comment 2 Notify RN    GLUCOSE, CAPILLARY     Status: Abnormal   Collection Time    02/19/14  5:03 PM      Result Value Ref Range   Glucose-Capillary 235 (*) 70 - 99 mg/dL   Comment 1 Notify RN     Comment 2 Documented in Chart    GLUCOSE, CAPILLARY     Status: Abnormal   Collection Time    02/19/14  9:20 PM      Result Value Ref Range   Glucose-Capillary 211 (*) 70 - 99 mg/dL   Comment 1 Documented in Chart     Comment 2 Notify RN    PROTIME-INR     Status: None   Collection Time    02/20/14  2:32 AM      Result Value Ref Range   Prothrombin Time 14.2  11.6 - 15.2 seconds   INR 1.10  0.00 - 1.49  GLUCOSE, CAPILLARY     Status: Abnormal   Collection Time    02/20/14  7:35 AM      Result Value Ref Range   Glucose-Capillary 173 (*) 70 - 99 mg/dL   Comment 1 Notify RN     Comment 2 Documented in Chart      SUBJECTIVE:  Pain controlled.  Some breathing through the nasal trumpets.  No SOB  OBJECTIVE:  Packs and trumpets removed.  Mild bleeding stopped spontaneously.  IMPRESSION:  Satisfactory check  PLAN:  Discharge home  Admit:  7 OCT Discharge:   8 OCT Final Diagnosis:  Deviated nasal septum.  Hypertrophic turbinates.  OSA.  DM Proc:  Nasal septoplasty, SMR inferior turbinates 7 OCT Comp:  None Cond:  Ambulatory, packs removed.  No bleeding.  Pain controlled.  Taking good po.  Spont void Recheck: 8 days my office Rx's:  Oxycodone, Hydrocodone, Keflex Instructions written and given  Hosp Course:  Underwent surgery, then to 3300 step down for monitoring due to OSA.  O2 sats good.  Min bleeding.  No  breathing issues.  Pain controlled.  On POD 1, packs removed and pt discharged to home and care of family.    Jodi Marble

## 2014-02-20 NOTE — Progress Notes (Signed)
Pt d/c home per MD order, pt VSS, pt verbalized understanding of d/c and instructions, family at [facility], d/c instructions given,  All questions answered

## 2014-02-26 ENCOUNTER — Other Ambulatory Visit: Payer: Self-pay | Admitting: Cardiology

## 2014-02-28 ENCOUNTER — Ambulatory Visit (INDEPENDENT_AMBULATORY_CARE_PROVIDER_SITE_OTHER): Payer: BC Managed Care – PPO

## 2014-02-28 DIAGNOSIS — I4891 Unspecified atrial fibrillation: Secondary | ICD-10-CM

## 2014-02-28 DIAGNOSIS — Z5181 Encounter for therapeutic drug level monitoring: Secondary | ICD-10-CM

## 2014-02-28 DIAGNOSIS — Z7901 Long term (current) use of anticoagulants: Secondary | ICD-10-CM

## 2014-02-28 LAB — POCT INR: INR: 2.2

## 2014-03-14 ENCOUNTER — Ambulatory Visit (INDEPENDENT_AMBULATORY_CARE_PROVIDER_SITE_OTHER): Payer: BC Managed Care – PPO | Admitting: Pharmacist

## 2014-03-14 DIAGNOSIS — Z7901 Long term (current) use of anticoagulants: Secondary | ICD-10-CM

## 2014-03-14 DIAGNOSIS — Z23 Encounter for immunization: Secondary | ICD-10-CM

## 2014-03-14 DIAGNOSIS — I4891 Unspecified atrial fibrillation: Secondary | ICD-10-CM

## 2014-03-14 DIAGNOSIS — Z5181 Encounter for therapeutic drug level monitoring: Secondary | ICD-10-CM

## 2014-03-14 LAB — POCT INR: INR: 2.3

## 2014-04-03 ENCOUNTER — Encounter: Payer: Self-pay | Admitting: Pulmonary Disease

## 2014-04-03 ENCOUNTER — Ambulatory Visit (INDEPENDENT_AMBULATORY_CARE_PROVIDER_SITE_OTHER): Payer: BC Managed Care – PPO | Admitting: Pulmonary Disease

## 2014-04-03 VITALS — BP 142/78 | HR 72 | Ht 75.0 in | Wt 257.2 lb

## 2014-04-03 DIAGNOSIS — G4733 Obstructive sleep apnea (adult) (pediatric): Secondary | ICD-10-CM

## 2014-04-03 DIAGNOSIS — J441 Chronic obstructive pulmonary disease with (acute) exacerbation: Secondary | ICD-10-CM

## 2014-04-03 DIAGNOSIS — J439 Emphysema, unspecified: Secondary | ICD-10-CM

## 2014-04-03 NOTE — Assessment & Plan Note (Signed)
Stay on CPAP - check download in 34month  Weight loss encouraged, compliance with goal of at least 4-6 hrs every night is the expectation. Advised against medications with sedative side effects Cautioned against driving when sleepy - understanding that sleepiness will vary on a day to day basis

## 2014-04-03 NOTE — Progress Notes (Signed)
   Subjective:    Patient ID: [name], male    DOB: 08-22-1955, 58 y.o.   MRN: [id number]  HPI  PCP - [name]   58/M, smoker with atrial fibrillation for FU of COPD & sleep apnea.  He had atrial fibrillation treated with flecainide in the past, now on multq& coumadin. He had TEE/DCCV.  He is a Estate manager/land agent & now disabled.  He reports sinus drainage for many years   Significant tests/ events  Spirometry 01/2010 showed ratio of 68, FEV 1 was 72%, smaller airways 55%, no significant BD response   PSG from [facility] - severe with AHI 31.h corrected by CPAP 5 cm (wt was 250 lbs)  11/2011 Underwent neck sx >> given pre-op prednisone for asthmatic bronchitis -uneventful post op    Spirometry fev1 68%  09/2012 FEV1 67%, no BD response  04/03/2014  Chief Complaint  Patient presents with  . Follow-up    Patient had nose surgery and has not been wearing CPAP.  Pt started wearing CPAP again about 2 weeks ago.  Does not have card for CPAP, he states that the DME company came and took the card from the CPAP to do a download and never returned it.  Pt states he sleeps better without CPAP.  Stays tired all the time.  41m FU Underwent nose surgery - [facility]icki Did not use CPAP x 2 wks but now getting back to it Smoked 1-3 cigs/d Spirometry  Uses albuterol MDI prn, has not needed neb Sugars uncontrolled -dr little raised metformin Compliant with coumadin & multaq     Review of Systems neg for any significant sore throat, dysphagia, itching, sneezing, nasal congestion or excess/ purulent secretions, fever, chills, sweats, unintended wt loss, pleuritic or exertional cp, hempoptysis, orthopnea pnd or change in chronic leg swelling. Also denies presyncope, palpitations, heartburn, abdominal pain, nausea, vomiting, diarrhea or change in bowel or urinary habits, dysuria,hematuria, rash, arthralgias, visual complaints, headache, numbness weakness or ataxia.     Objective:   Physical  Exam  Gen. Pleasant, tall, in no distress ENT - no lesions, no post nasal drip, class 2 airway Neck: No JVD, no thyromegaly, no carotid bruits Lungs: no use of accessory muscles, no dullness to percussion, decreased without rales or rhonchi  Cardiovascular: Rhythm regular, heart sounds  normal, no murmurs or gallops, no peripheral edema Musculoskeletal: No deformities, no cyanosis or clubbing , no tremors       Assessment & Plan:

## 2014-04-03 NOTE — Patient Instructions (Signed)
Stay on albuterol MDI as needed - call if using 3-4 times/day You have to QUIT smoking completely Stay on CPAP - check download in 35month

## 2014-04-03 NOTE — Assessment & Plan Note (Signed)
Stay on albuterol MDI as needed - call if using 3-4 times/day You have to [place] smoking completely

## 2014-04-18 ENCOUNTER — Ambulatory Visit (INDEPENDENT_AMBULATORY_CARE_PROVIDER_SITE_OTHER): Payer: BC Managed Care – PPO

## 2014-04-18 ENCOUNTER — Ambulatory Visit (INDEPENDENT_AMBULATORY_CARE_PROVIDER_SITE_OTHER): Payer: BC Managed Care – PPO | Admitting: Internal Medicine

## 2014-04-18 ENCOUNTER — Encounter: Payer: Self-pay | Admitting: Internal Medicine

## 2014-04-18 VITALS — BP 150/78 | HR 72 | Ht 75.0 in | Wt 255.4 lb

## 2014-04-18 DIAGNOSIS — I480 Paroxysmal atrial fibrillation: Secondary | ICD-10-CM

## 2014-04-18 DIAGNOSIS — Z5181 Encounter for therapeutic drug level monitoring: Secondary | ICD-10-CM

## 2014-04-18 DIAGNOSIS — I4891 Unspecified atrial fibrillation: Secondary | ICD-10-CM

## 2014-04-18 DIAGNOSIS — Z7901 Long term (current) use of anticoagulants: Secondary | ICD-10-CM

## 2014-04-18 DIAGNOSIS — Z720 Tobacco use: Secondary | ICD-10-CM

## 2014-04-18 DIAGNOSIS — G4733 Obstructive sleep apnea (adult) (pediatric): Secondary | ICD-10-CM

## 2014-04-18 DIAGNOSIS — I10 Essential (primary) hypertension: Secondary | ICD-10-CM

## 2014-04-18 LAB — POCT INR: INR: 2.1

## 2014-04-18 MED ORDER — LISINOPRIL 10 MG PO TABS: 10.0000 mg | ORAL_TABLET | Freq: Every day | ORAL | Status: DC

## 2014-04-18 NOTE — Progress Notes (Signed)
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  He recently was evaluated by ENT and underwent sinus surgery.  He is very pleased with results.  He is breathing "much better".  He feels that his af is well controlled.  He does not wish to make changes presently.  Today, he denies symptoms of chest pain, shortness of breath, or  lower extremity edema.  He continues to smoke but is trying to cut back/ quit.  The patient is otherwise without complaint today.   Past Medical History  Diagnosis Date  . HLD (hyperlipidemia)     takes Pravastatin daily  . Obesity   . Tobacco abuse   . Overdose 2009    unintentional Flacanide overdose  . HTN (hypertension)     takes Prinizide daily  . Dyspnea     LHC 11/08: EF 60%, normal coronary arteries;  ETT-echo 2/11: normal at 71% PMHR  . Shortness of breath     lying/sitting/exertion  . Seasonal allergies     was on Claritin-stopped taking this pt states it wasn't helping  . Coughing   . History of bronchitis 85yrs ago  . COPD (chronic obstructive pulmonary disease)   . Emphysema   . Peripheral edema     takes Furosemide as needed  . Hypokalemia     hx of;pt states he was taking K+ but now eats a banana daily instead  . Dizziness   . Short-term memory loss   . Arthritis     lower back  . Chronic back pain     DDD/stenosis  . Bruises easily     takes Coumadin daily  . GERD (gastroesophageal reflux disease)     takes Omeprazole bid  . Colon polyps     9 polyps removed 10/13/11  . Urinary frequency   . Urinary urgency   . Diabetes mellitus     takes Metformin and Glimepiride daily  . History of shingles   . Atrial flutter     s/p CTI ablation by Dr Rayann Heman  . Paroxysmal atrial fibrillation   . Pneumonia 3-4 yrs ago    none recent  . OSA (obstructive sleep apnea)     not always using cpap  . Brain aneurysm 2009    questionable. A follow up CTA  in 2009 showed no evidence of  . Peripheral neuropathy   . Complication of anesthesia September 11, 2012    slow to awaken after ablation   Past Surgical History  Procedure Laterality Date  . Neck surgery  66yrs ago  . Right shoulder surgery  4-23yrs ago    cyst removed  . Left inguinal hernia repair      as a child  . Tee without cardioversion  05/05/2011    Procedure: TRANSESOPHAGEAL ECHOCARDIOGRAM (TEE);  Surgeon: Lelon Perla, MD;  Location: [facility] ENDOSCOPY;  Service: Cardiovascular;  Laterality: N/A;  . Cardioversion  05/05/2011    Procedure: CARDIOVERSION;  Surgeon: Lelon Perla, MD;  Location: [facility] ENDOSCOPY;  Service: Cardiovascular;  Laterality: N/A;  . Throat surgery  4-73yrs ago    "thought " it was cancer but came back not  . Carpal tunnel release  99/2000    bilateral  . Appendectomy  2-3yrs ago  . Knee surgery  6-38yrs ago    left  . Cardiac catheterization  2008    no significant CAD  . Tonsillectomy    .  Hernia repair    . Anterior cervical decomp/discectomy fusion  01/04/2012    Procedure: ANTERIOR CERVICAL DECOMPRESSION/DISCECTOMY FUSION 1 LEVEL/HARDWARE REMOVAL;  Surgeon: Ophelia Charter, MD;  Location: [place] NEURO ORS;  Service: Neurosurgery;  Laterality: N/A;  explore cervical fusion Cervical six - seven  with removal of codman plate anterior cervical decompression with fusion interbody prothesis plating and bonegraft  . Cardioversion Bilateral 07/26/2012    Procedure: CARDIOVERSION;  Surgeon: Minus Breeding, MD;  Location: [place];  Service: Cardiovascular;  Laterality: Bilateral;  . Atrial flutter ablation  09/11/2012    CTI ablation by Dr Rayann Heman  . Colonoscopy with propofol N/A 12/13/2012    Procedure: COLONOSCOPY WITH PROPOFOL;  Surgeon: Milus Banister, MD;  Location: [facility] ENDOSCOPY;  Service: Endoscopy;  Laterality: N/A;  . Nasal septoplasty w/ turbinoplasty Bilateral 02/19/2014    Procedure: NASAL SEPTOPLASTY WITH BILATERAL TURBINATE REDUCTION;  Surgeon: Jodi Marble, MD;  Location: [place];  Service: ENT;  Laterality: Bilateral;    Current Outpatient Prescriptions  Medication Sig Dispense Refill  . albuterol (PROVENTIL HFA;VENTOLIN HFA) 108 (90 BASE) MCG/ACT inhaler Inhale 2 puffs into the lungs every 6 (six) hours as needed for wheezing or shortness of breath.    . bisoprolol (ZEBETA) 10 MG tablet Take 10 mg by mouth daily.    . Cholecalciferol (VITAMIN D) 2000 UNITS CAPS Take 1 capsule by mouth daily.    [name] diltiazem (CARDIZEM) 30 MG tablet Take 15 mg by mouth 2 (two) times daily as needed (AFIB).     [name] dronedarone (MULTAQ) 400 MG tablet Take 1 tablet (400 mg total) by mouth 2 (two) times daily with a meal. 60 tablet 11  . furosemide (LASIX) 40 MG tablet Take 40 mg by mouth daily as needed for fluid.     [name] glimepiride (AMARYL) 1 MG tablet Take 1 mg by mouth daily before breakfast.     . HYDROcodone-acetaminophen (NORCO/VICODIN) 5-325 MG per tablet Take 1 tablet by mouth every 6 (six) hours as needed for pain.    . Liraglutide (VICTOZA) 18 MG/3ML SOPN Inject 12 mLs into the skin daily.     . metFORMIN (GLUCOPHAGE) 500 MG tablet Take 1,000 mg by mouth 2 (two) times daily with a meal.     . Multiple Vitamin (MULTIVITAMIN WITH MINERALS) TABS Take 1 tablet by mouth daily.    [name] omeprazole (PRILOSEC) 20 MG capsule Take 20 mg by mouth 2 (two) times daily.     [name] oxyCODONE-acetaminophen (PERCOCET/ROXICET) 5-325 MG per tablet Take 1-2 tablets by mouth every 4 (four) hours as needed for moderate pain. 30 tablet 0  . potassium chloride (KLOR-CON 10) 10 MEQ tablet Take 10 mEq by mouth daily.     . pravastatin (PRAVACHOL) 80 MG tablet Take 1 tablet (80 mg total) by mouth at bedtime. 30 tablet 6  . warfarin (COUMADIN) 5 MG tablet Take 5-7.5 mg by mouth daily. 7.5 mg M W F, And 5 mg all other days    . lisinopril (PRINIVIL,ZESTRIL) 10 MG tablet Take 1 tablet (10 mg total) by mouth daily. 90 tablet 3   No current facility-administered medications for this visit.     Physical Exam: Filed Vitals:   04/18/14 1427  BP: 150/78  Pulse: 72  Height: 6\' 3"  (1.905 m)  Weight: 255 lb 6.4 oz (115.849 kg)    GEN- The patient is overweight and chronically ill appearing, alert and oriented x 3 today.   Head- normocephalic, atraumatic Eyes-  Sclera clear, conjunctiva  pink Ears- hearing intact Oropharynx- clear Lungs- prolonged expiratory wheezing,  Heart- Regular rate and rhythm, no murmurs, rubs or gallops, PMI not laterally displaced GI- soft, NT, ND, + BS Extremities- no clubbing, cyanosis, or edema, he has several lesions on his R forearm which I have strongly advised that he see dermatology asap to address  ekg today reveals sinus rhythm 72 bpm  Echo reveals pulmonary hypertension and LA enlargment  Assessment and Plan:  1. Atrial fibrillation Stable He does not wish to pursue ablation presently No changes at this time  2. OSA Improved with sinus surgery He continues to try to wear his CPAP  3. Tobacco Cessation strongly advised He is making plans to quit  4. Hypertension Elevated BP Given diabetes, I would recommend ace inhibitor Recent labs reviewed in epic Start lisinopril 10mg  daily bmet in 4 weeks  Return in 4 months

## 2014-04-18 NOTE — Patient Instructions (Signed)
Your physician recommends that you schedule a follow-up appointment in: 4 months with Dr.Allred  Your physician recommends that you return for lab work in: 6 weeks on 05/23/14 BMP  Your physician has recommended you make the following change in your medication:  1) Start lisinopril 10mg  daily

## 2014-04-24 ENCOUNTER — Encounter (HOSPITAL_COMMUNITY): Payer: Self-pay | Admitting: Internal Medicine

## 2014-05-23 ENCOUNTER — Ambulatory Visit (INDEPENDENT_AMBULATORY_CARE_PROVIDER_SITE_OTHER): Payer: BLUE CROSS/BLUE SHIELD | Admitting: *Deleted

## 2014-05-23 ENCOUNTER — Other Ambulatory Visit (INDEPENDENT_AMBULATORY_CARE_PROVIDER_SITE_OTHER): Payer: BLUE CROSS/BLUE SHIELD | Admitting: *Deleted

## 2014-05-23 DIAGNOSIS — I4891 Unspecified atrial fibrillation: Secondary | ICD-10-CM

## 2014-05-23 DIAGNOSIS — I480 Paroxysmal atrial fibrillation: Secondary | ICD-10-CM

## 2014-05-23 DIAGNOSIS — Z5181 Encounter for therapeutic drug level monitoring: Secondary | ICD-10-CM

## 2014-05-23 LAB — BASIC METABOLIC PANEL
BUN: 10 mg/dL (ref 6–23)
CO2: 27 mEq/L (ref 19–32)
Calcium: 9.2 mg/dL (ref 8.4–10.5)
Chloride: 107 mEq/L (ref 96–112)
Creatinine, Ser: 0.8 mg/dL (ref 0.4–1.5)
GFR: 110.87 mL/min (ref >60.00)
Glucose, Bld: 168 mg/dL — ABNORMAL HIGH (ref 70–99)
POTASSIUM: 4 meq/L (ref 3.5–5.1)
Sodium: 143 mEq/L (ref 135–145)

## 2014-05-23 LAB — POCT INR: INR: 2.4

## 2014-06-02 ENCOUNTER — Telehealth: Payer: Self-pay | Admitting: Pulmonary Disease

## 2014-06-02 NOTE — Telephone Encounter (Signed)
Will forward to RA so he is aware

## 2014-06-03 ENCOUNTER — Other Ambulatory Visit: Payer: Self-pay | Admitting: Cardiology

## 2014-06-16 ENCOUNTER — Other Ambulatory Visit: Payer: Self-pay | Admitting: Internal Medicine

## 2014-06-16 NOTE — Telephone Encounter (Signed)
Will sign and forward as FYI to RA

## 2014-07-04 ENCOUNTER — Ambulatory Visit (INDEPENDENT_AMBULATORY_CARE_PROVIDER_SITE_OTHER): Payer: BLUE CROSS/BLUE SHIELD | Admitting: *Deleted

## 2014-07-04 DIAGNOSIS — Z5181 Encounter for therapeutic drug level monitoring: Secondary | ICD-10-CM

## 2014-07-04 DIAGNOSIS — I4891 Unspecified atrial fibrillation: Secondary | ICD-10-CM

## 2014-07-04 DIAGNOSIS — Z7901 Long term (current) use of anticoagulants: Secondary | ICD-10-CM

## 2014-07-04 LAB — POCT INR: INR: 2.3

## 2014-07-21 ENCOUNTER — Telehealth: Payer: Self-pay | Admitting: Pulmonary Disease

## 2014-07-21 MED ORDER — PREDNISONE 10 MG PO TABS: ORAL_TABLET | ORAL | Status: DC

## 2014-07-21 MED ORDER — AZITHROMYCIN 250 MG PO TABS: ORAL_TABLET | ORAL | Status: AC

## 2014-07-21 NOTE — Telephone Encounter (Signed)
Can try zpak and Prednisone 10 mg take  4 each am x 2 days,   2 each am x 2 days,  1 each am x 2 days and stop but note he is on lisinopril and may need a trial off if not better or ov by end of week to sort out

## 2014-07-21 NOTE — Telephone Encounter (Signed)
Spoke with pt. Reports lots of coughing and yellow mucus production. Onset was 1 week ago. SOB and chest tightness are also present. Would like something sent in, since we do not have any openings for appointments.  MW - please advise. Thanks.

## 2014-07-21 NOTE — Telephone Encounter (Signed)
Rx's have been sent in per MW. Pt is aware. Nothing further was needed.

## 2014-08-15 ENCOUNTER — Ambulatory Visit (INDEPENDENT_AMBULATORY_CARE_PROVIDER_SITE_OTHER): Payer: BLUE CROSS/BLUE SHIELD | Admitting: *Deleted

## 2014-08-15 DIAGNOSIS — Z5181 Encounter for therapeutic drug level monitoring: Secondary | ICD-10-CM | POA: Diagnosis not present

## 2014-08-15 DIAGNOSIS — I4891 Unspecified atrial fibrillation: Secondary | ICD-10-CM

## 2014-08-15 DIAGNOSIS — Z7901 Long term (current) use of anticoagulants: Secondary | ICD-10-CM | POA: Diagnosis not present

## 2014-08-15 LAB — POCT INR: INR: 3

## 2014-09-12 ENCOUNTER — Other Ambulatory Visit: Payer: Self-pay | Admitting: Internal Medicine

## 2014-09-17 ENCOUNTER — Telehealth: Payer: Self-pay | Admitting: Cardiology

## 2014-09-17 DIAGNOSIS — I480 Paroxysmal atrial fibrillation: Secondary | ICD-10-CM

## 2014-09-17 MED ORDER — LISINOPRIL 20 MG PO TABS: 20.0000 mg | ORAL_TABLET | Freq: Every day | ORAL | Status: DC

## 2014-09-17 NOTE — Telephone Encounter (Signed)
He can increase his lisinopril to 20 mg daily.  He was in the recall list for 03/2014 but I don't see that we made this appt.  Please call him to arrange follow up for between 2 - 4 weeks after he increases the lisinopril.

## 2014-09-17 NOTE — Telephone Encounter (Signed)
Pt c/o BP issue: STAT if pt c/o blurred vision, one-sided weakness or slurred speech  1. What are your last 5 BP readings? 5/3 last night 170 something over 80 something per pt   2. Are you having any other symptoms (ex. Dizziness, headache, blurred vision, passed out)? no  3. What is your BP issue? Pt states his bp has been high for the past few weeks

## 2014-09-17 NOTE — Telephone Encounter (Signed)
Closed encounter °

## 2014-09-17 NOTE — Telephone Encounter (Signed)
Instructions given to increase lisinopril. Pt verbalized understanding. Requests rx to be sent to pharmacy, and requests callback to schedule f/u - will send msg to scheduling. Sent new dose rx to pharmacy.

## 2014-09-17 NOTE — Telephone Encounter (Signed)
Pt states problem w/ BPs running high over last 4 weeks.  He cites 170s/80s typically. HRs 70-80. He cites no symptoms w/ this.  Confirmed current meds OTP, he is compliant w/ regimen.  Informed patient I will defer to Dr. Percival Spanish to advise.

## 2014-09-26 ENCOUNTER — Ambulatory Visit (INDEPENDENT_AMBULATORY_CARE_PROVIDER_SITE_OTHER): Payer: BLUE CROSS/BLUE SHIELD | Admitting: *Deleted

## 2014-09-26 DIAGNOSIS — I4891 Unspecified atrial fibrillation: Secondary | ICD-10-CM | POA: Diagnosis not present

## 2014-09-26 DIAGNOSIS — Z7901 Long term (current) use of anticoagulants: Secondary | ICD-10-CM

## 2014-09-26 DIAGNOSIS — Z5181 Encounter for therapeutic drug level monitoring: Secondary | ICD-10-CM | POA: Diagnosis not present

## 2014-09-26 LAB — POCT INR: INR: 1.9

## 2014-10-01 ENCOUNTER — Encounter: Payer: Self-pay | Admitting: Cardiology

## 2014-10-01 ENCOUNTER — Ambulatory Visit (INDEPENDENT_AMBULATORY_CARE_PROVIDER_SITE_OTHER): Payer: BLUE CROSS/BLUE SHIELD | Admitting: Cardiology

## 2014-10-01 VITALS — BP 136/70 | HR 71 | Ht 75.0 in | Wt 249.0 lb

## 2014-10-01 DIAGNOSIS — I483 Typical atrial flutter: Secondary | ICD-10-CM

## 2014-10-01 DIAGNOSIS — I4891 Unspecified atrial fibrillation: Secondary | ICD-10-CM

## 2014-10-01 NOTE — Patient Instructions (Signed)
Your physician recommends that you schedule a follow-up appointment in: one year with Dr. Hochrein  

## 2014-10-01 NOTE — Progress Notes (Signed)
HPI The patient presents for followup of atrial flutter and atrial fibrillation.  He is status post flutter ablation.  He was having pain increased episodes of palpitations. However, Dr Rayann Heman did not think he was a good candidate for further ablation. He's been controlled however on Multaq.  He says he has good days and bad days. However, he thinks he has more good days and those are days when he can do activities. He somewhat limited by joint and back pains. His palpitations are short lived when they happen. He's had no sustained dysrhythmias. He denies any chest pressure, neck or arm discomfort. He's had no new shortness of breath, PND or orthopnea. He has had no weight gain or edema.   Allergies  Allergen Reactions  . Adhesive [Tape]     itching  . Latex Itching    When tape is on the skin too long skin gets red & itching    Current Outpatient Prescriptions  Medication Sig Dispense Refill  . albuterol (PROVENTIL HFA;VENTOLIN HFA) 108 (90 BASE) MCG/ACT inhaler Inhale 2 puffs into the lungs every 6 (six) hours as needed for wheezing or shortness of breath.    . bisoprolol (ZEBETA) 10 MG tablet Take 10 mg by mouth daily.    . Cholecalciferol (VITAMIN D) 2000 UNITS CAPS Take 1 capsule by mouth daily.    [name] diltiazem (CARDIZEM) 30 MG tablet Take 15 mg by mouth 2 (two) times daily as needed (AFIB).     [name] dronedarone (MULTAQ) 400 MG tablet Take 1 tablet (400 mg total) by mouth 2 (two) times daily with a meal. 60 tablet 11  . furosemide (LASIX) 40 MG tablet Take 40 mg by mouth daily as needed for fluid.     [name] glimepiride (AMARYL) 1 MG tablet Take 1 mg by mouth daily before breakfast.     . HYDROcodone-acetaminophen (NORCO/VICODIN) 5-325 MG per tablet Take 1 tablet by mouth every 6 (six) hours as needed for pain.    . Liraglutide (VICTOZA) 18 MG/3ML SOPN Inject 12 mLs into the skin daily.     [name] lisinopril (PRINIVIL,ZESTRIL) 20 MG tablet Take 1 tablet (20 mg total) by mouth daily. 90 tablet 1  .  metFORMIN (GLUCOPHAGE) 500 MG tablet Take 1,000 mg by mouth 2 (two) times daily with a meal.     . Multiple Vitamin (MULTIVITAMIN WITH MINERALS) TABS Take 1 tablet by mouth daily.    [name] omeprazole (PRILOSEC) 20 MG capsule Take 20 mg by mouth 2 (two) times daily.     [name] oxyCODONE-acetaminophen (PERCOCET/ROXICET) 5-325 MG per tablet Take 1-2 tablets by mouth every 4 (four) hours as needed for moderate pain. 30 tablet 0  . potassium chloride (KLOR-CON 10) 10 MEQ tablet Take 10 mEq by mouth daily.     . pravastatin (PRAVACHOL) 80 MG tablet TAKE 1 TABLET BY MOUTH EVERY NIGHT AT BEDTIME 30 tablet 0  . warfarin (COUMADIN) 5 MG tablet Take 5-7.5 mg by mouth daily. 7.5 mg M W F, And 5 mg all other days     No current facility-administered medications for this visit.    Past Medical History  Diagnosis Date  . HLD (hyperlipidemia)     takes Pravastatin daily  . Obesity   . Tobacco abuse   . Overdose 2009    unintentional Flacanide overdose  . HTN (hypertension)     takes Prinizide daily  . Dyspnea     LHC 11/08: EF 60%, normal coronary arteries;  ETT-echo 2/11: normal at  71% PMHR  . Shortness of breath     lying/sitting/exertion  . Seasonal allergies     was on Claritin-stopped taking this pt states it wasn't helping  . Coughing   . History of bronchitis 21yrs ago  . COPD (chronic obstructive pulmonary disease)   . Emphysema   . Peripheral edema     takes Furosemide as needed  . Hypokalemia     hx of;pt states he was taking K+ but now eats a banana daily instead  . Dizziness   . Short-term memory loss   . Arthritis     lower back  . Chronic back pain     DDD/stenosis  . Bruises easily     takes Coumadin daily  . GERD (gastroesophageal reflux disease)     takes Omeprazole bid  . Colon polyps     9 polyps removed 10/13/11  . Urinary frequency   . Urinary urgency   . Diabetes mellitus     takes Metformin and Glimepiride daily  . History of shingles   . Atrial flutter     s/p CTI  ablation by Dr Rayann Heman  . Paroxysmal atrial fibrillation   . Pneumonia 3-4 yrs ago    none recent  . OSA (obstructive sleep apnea)     not always using cpap  . Brain aneurysm 2009    questionable. A follow up CTA in 2009 showed no evidence of  . Peripheral neuropathy   . Complication of anesthesia September 11, 2012    slow to awaken after ablation    Past Surgical History  Procedure Laterality Date  . Neck surgery  90yrs ago  . Right shoulder surgery  4-4yrs ago    cyst removed  . Left inguinal hernia repair      as a child  . Tee without cardioversion  05/05/2011    Procedure: TRANSESOPHAGEAL ECHOCARDIOGRAM (TEE);  Surgeon: Lelon Perla, MD;  Location: [facility] ENDOSCOPY;  Service: Cardiovascular;  Laterality: N/A;  . Cardioversion  05/05/2011    Procedure: CARDIOVERSION;  Surgeon: Lelon Perla, MD;  Location: [facility] ENDOSCOPY;  Service: Cardiovascular;  Laterality: N/A;  . Throat surgery  4-8yrs ago    "thought " it was cancer but came back not  . Carpal tunnel release  99/2000    bilateral  . Appendectomy  2-32yrs ago  . Knee surgery  6-22yrs ago    left  . Cardiac catheterization  2008    no significant CAD  . Tonsillectomy    . Hernia repair    . Anterior cervical decomp/discectomy fusion  01/04/2012    Procedure: ANTERIOR CERVICAL DECOMPRESSION/DISCECTOMY FUSION 1 LEVEL/HARDWARE REMOVAL;  Surgeon: Ophelia Charter, MD;  Location: [place] NEURO ORS;  Service: Neurosurgery;  Laterality: N/A;  explore cervical fusion Cervical six - seven  with removal of codman plate anterior cervical decompression with fusion interbody prothesis plating and bonegraft  . Cardioversion Bilateral 07/26/2012    Procedure: CARDIOVERSION;  Surgeon: Minus Breeding, MD;  Location: [place];  Service: Cardiovascular;  Laterality: Bilateral;  . Atrial flutter ablation  09/11/2012    CTI ablation by Dr Rayann Heman  . Colonoscopy with propofol N/A 12/13/2012    Procedure: COLONOSCOPY WITH PROPOFOL;  Surgeon: Milus Banister, MD;  Location: [facility] ENDOSCOPY;  Service: Endoscopy;  Laterality: N/A;  . Nasal septoplasty w/ turbinoplasty Bilateral 02/19/2014    Procedure: NASAL SEPTOPLASTY WITH BILATERAL TURBINATE REDUCTION;  Surgeon: Jodi Marble, MD;  Location: [place];  Service: ENT;  Laterality: Bilateral;  .  Atrial fibrillation ablation N/A 09/11/2012    Procedure: ATRIAL FIBRILLATION ABLATION;  Surgeon: Thompson Grayer, MD;  Location: [facility] CATH LAB;  Service: Cardiovascular;  Laterality: N/A;    ROS:  Burning in feet related to neuropathy and back pain.  Otherwise as stated in the HPI and negative for all other systems.  PHYSICAL EXAM BP 136/70 mmHg  Pulse 71  Ht 6\' 3"  (1.905 m)  Wt 249 lb (112.946 kg)  BMI 31.12 kg/m2 GENERAL:  Well appearing HEENT:  Pupils equal round and reactive, fundi not visualized, oral mucosa , edentulous NECK:  No jugular venous distention, waveform within normal limits, carotid upstroke brisk and symmetric, no bruits, no thyromegaly LUNGS:  Clear to auscultation bilaterally, diminished BS with scattered wheezes. BACK:  No CVA tenderness CHEST:  Unremarkable HEART:  PMI not displaced or sustained,S1 and S2 within normal limits, no S3, no S4, no clicks, no rubs, no murmurs, regular ABD:  Flat, positive bowel sounds normal in frequency in pitch, no bruits, no rebound, no guarding, no midline pulsatile mass, no hepatomegaly, no splenomegaly EXT:  2 plus pulses throughout, no edema, no cyanosis no clubbing   EKG:  Sinus rhythm, rate 71, axis within normal limits, intervals within normal T wave changes.  10/01/2014  ASSESSMENT AND PLAN  ATRIAL FLUTTER/FIB:  He has tolerable short paroxysms. No change in therapy is indicated.  HTN:   The blood pressure is at target. No change in medications is indicated. We will continue with therapeutic lifestyle changes (TLC).  TOBACCO ABUSE:  He says his only smoking a few cigarettes per day. We have again talked about the need to quit  altogether  ALCOHOL USE:  He says he had 2 drinks on the last year. I encourage to use abstinence.

## 2014-10-02 ENCOUNTER — Ambulatory Visit (INDEPENDENT_AMBULATORY_CARE_PROVIDER_SITE_OTHER): Payer: BLUE CROSS/BLUE SHIELD | Admitting: Adult Health

## 2014-10-02 ENCOUNTER — Encounter: Payer: Self-pay | Admitting: Adult Health

## 2014-10-02 ENCOUNTER — Ambulatory Visit (INDEPENDENT_AMBULATORY_CARE_PROVIDER_SITE_OTHER)
Admission: RE | Admit: 2014-10-02 | Discharge: 2014-10-02 | Disposition: A | Discharge status: Home / Self Care | Payer: BLUE CROSS/BLUE SHIELD | Source: Ambulatory Visit | Attending: Adult Health | Admitting: Adult Health

## 2014-10-02 VITALS — BP 150/84 | HR 72 | Temp 98.7°F | Ht 75.0 in | Wt 251.0 lb

## 2014-10-02 DIAGNOSIS — J449 Chronic obstructive pulmonary disease, unspecified: Secondary | ICD-10-CM | POA: Diagnosis not present

## 2014-10-02 DIAGNOSIS — G4733 Obstructive sleep apnea (adult) (pediatric): Secondary | ICD-10-CM | POA: Diagnosis not present

## 2014-10-02 NOTE — Progress Notes (Signed)
   Subjective:    Patient ID: [name], male    DOB: 1955-12-25, 59 y.o.   MRN: [id number]  HPI  PCP - [name]   59/M, smoker with atrial fibrillation for FU of COPD & sleep apnea.  He had atrial fibrillation treated with flecainide in the past, now on multq& coumadin. He had TEE/DCCV.  He is a Estate manager/land agent & now disabled.  He reports sinus drainage for many years   Significant tests/ events  Spirometry 01/2010 showed ratio of 68, FEV 1 was 72%, smaller airways 55%, no significant BD response   PSG from [facility] - severe with AHI 31.h corrected by CPAP 5 cm (wt was 250 lbs)  11/2011 Underwent neck sx >> given pre-op prednisone for asthmatic bronchitis -uneventful post op    Spirometry fev1 68%  09/2012 FEV1 67%, no BD response  04/03/2014  Chief Complaint  Patient presents with  . Follow-up    Patient had nose surgery and has not been wearing CPAP.  Pt started wearing CPAP again about 2 weeks ago.  Does not have card for CPAP, he states that the DME company came and took the card from the CPAP to do a download and never returned it.  Pt states he sleeps better without CPAP.  Stays tired all the time.  72m FU Underwent nose surgery - wolicki Did not use CPAP x 2 wks but now getting back to it Smoked 1-3 cigs/d Spirometry  Uses albuterol MDI prn, has not needed neb Sugars uncontrolled -dr little raised metformin Compliant with coumadin & multaq   10/02/2014 Follow up COPD /OSA  Returns for 6 month follow up .  Uses albuterol As needed  , not on controller use.  Continues to smoke, advised on cessation  ON ACE denies flare of cough .  Last cxr 09/2013 with no acute process.  Spirometry 2015 with FEV1 59%, ratio 61  PVX utd .  No ER or hospitalizations.  Suppose to be on CPAP but does not wear it.  Discussed complaince-says he can not use this .  No chest pain, hemoptysis edema , n/v/d.     Review of Systems neg for any significant sore throat, dysphagia,  itching, sneezing, nasal congestion or excess/ purulent secretions, fever, chills, sweats, unintended wt loss, pleuritic or exertional cp, hempoptysis, orthopnea pnd or change in chronic leg swelling. Also denies presyncope, palpitations, heartburn, abdominal pain, nausea, vomiting, diarrhea or change in bowel or urinary habits, dysuria,hematuria, rash, arthralgias, visual complaints, headache, numbness weakness or ataxia.     Objective:   Physical Exam  Gen. Pleasant, tall, in no distress ENT - no lesions, no post nasal drip, class 2 airway Neck: No JVD, no thyromegaly, no carotid bruits Lungs: no use of accessory muscles, no dullness to percussion, decreased without rales or rhonchi  Cardiovascular: Rhythm regular, heart sounds  normal, no murmurs or gallops, no peripheral edema Musculoskeletal: No deformities, no cyanosis or clubbing , no tremors       Assessment & Plan:

## 2014-10-02 NOTE — Patient Instructions (Signed)
Must quit smoking -most important goal .  May use Pro Air 2 puffs or Albuterol Neb every 4hr as needed.  Work on weight loss.  Follow up Dr. Elsworth Soho  In 6 months and As needed

## 2014-10-02 NOTE — Addendum Note (Signed)
Addended by: Osa Craver on: 10/02/2014 09:21 AM   Modules accepted: Orders

## 2014-10-02 NOTE — Assessment & Plan Note (Addendum)
Compensated on present regimen  Minimal symptoms , not on maintenance therapy Continue to montior  Add controller if more sx or frequent flares Chest cxr today   Plan  Must quit smoking -most important goal .  May use Pro Air 2 puffs or Albuterol Neb every 4hr as needed.  Work on weight loss.  Follow up Dr. Elsworth Soho  In 6 months and As needed

## 2014-10-02 NOTE — Assessment & Plan Note (Signed)
Pt education on OSA  Encouraged on CPAP usage  Wt loss important [place]ward Fortes

## 2014-10-03 NOTE — Progress Notes (Signed)
Reviewed & agree with plan  

## 2014-10-14 ENCOUNTER — Other Ambulatory Visit: Payer: Self-pay | Admitting: Internal Medicine

## 2014-10-22 ENCOUNTER — Other Ambulatory Visit: Payer: Self-pay | Admitting: Cardiology

## 2014-10-24 ENCOUNTER — Ambulatory Visit (INDEPENDENT_AMBULATORY_CARE_PROVIDER_SITE_OTHER): Payer: BLUE CROSS/BLUE SHIELD | Admitting: *Deleted

## 2014-10-24 DIAGNOSIS — Z5181 Encounter for therapeutic drug level monitoring: Secondary | ICD-10-CM

## 2014-10-24 DIAGNOSIS — I4891 Unspecified atrial fibrillation: Secondary | ICD-10-CM | POA: Diagnosis not present

## 2014-10-24 DIAGNOSIS — Z7901 Long term (current) use of anticoagulants: Secondary | ICD-10-CM

## 2014-10-24 LAB — POCT INR: INR: 2

## 2014-10-30 ENCOUNTER — Other Ambulatory Visit: Payer: Self-pay | Admitting: Internal Medicine

## 2014-11-21 ENCOUNTER — Ambulatory Visit (INDEPENDENT_AMBULATORY_CARE_PROVIDER_SITE_OTHER): Payer: BLUE CROSS/BLUE SHIELD | Admitting: *Deleted

## 2014-11-21 DIAGNOSIS — I4891 Unspecified atrial fibrillation: Secondary | ICD-10-CM

## 2014-11-21 DIAGNOSIS — Z5181 Encounter for therapeutic drug level monitoring: Secondary | ICD-10-CM | POA: Diagnosis not present

## 2014-11-21 DIAGNOSIS — Z7901 Long term (current) use of anticoagulants: Secondary | ICD-10-CM | POA: Diagnosis not present

## 2014-11-21 LAB — POCT INR: INR: 2.2

## 2014-12-19 ENCOUNTER — Ambulatory Visit (INDEPENDENT_AMBULATORY_CARE_PROVIDER_SITE_OTHER): Payer: BLUE CROSS/BLUE SHIELD | Admitting: *Deleted

## 2014-12-19 DIAGNOSIS — Z7901 Long term (current) use of anticoagulants: Secondary | ICD-10-CM | POA: Diagnosis not present

## 2014-12-19 DIAGNOSIS — Z5181 Encounter for therapeutic drug level monitoring: Secondary | ICD-10-CM | POA: Diagnosis not present

## 2014-12-19 DIAGNOSIS — I4891 Unspecified atrial fibrillation: Secondary | ICD-10-CM

## 2014-12-19 LAB — POCT INR: INR: 2.4

## 2015-01-16 ENCOUNTER — Telehealth: Payer: Self-pay | Admitting: Cardiology

## 2015-01-16 NOTE — Telephone Encounter (Signed)
Agree.  Best to stop this supplement.

## 2015-01-16 NOTE — Telephone Encounter (Signed)
I can't find any information that garcinia cambogia causes any cardiac side effects, mostly GI problems, but I agree it's best to stop for now.

## 2015-01-16 NOTE — Telephone Encounter (Signed)
Agree 

## 2015-01-16 NOTE — Telephone Encounter (Signed)
Patient

## 2015-01-16 NOTE — Telephone Encounter (Signed)
Patient began a irregular rhythm yesterday  Today at 0130 am woke up to a irregular rhythm  At a rate of 100-130.  Took 30 mg 1/2 of 60 mg tab. This is ordered as needed once or twice a day  This morning at 0800 this morning his heart rate was down to 80    Started on a new supplemental RX recommended by his PCP, Garcinia Cambogia a weight loss supplement within the last two weeks.  Will route to Dr.Hochrein and Erskine Emery along with DOD Dr. Ned Grace  I advised the patient that holding the Garcinia Cambogia right now would be appropriate.

## 2015-01-16 NOTE — Telephone Encounter (Signed)
Patient c/o Afib :  High priority if patient c/o lightheadedness and shortness of breath.  1. How long have you been having Afib? Within the last 24 hours (Coming and going    2. Are you currently experiencing lightheadedness and shortness of breath? A little SOB and a little weak. At times he can walk and at times his legs feel weak.   3. Have you checked your BP and heart rate? (document readings) Heart rate is fluctuating anywhere from 100-120. BP machine doesn't work.   4. Are you experiencing any other symptoms? He just feels his heart beating all over.

## 2015-01-20 NOTE — Telephone Encounter (Signed)
Patient will eliminate this from his daily regimen of medications

## 2015-01-27 ENCOUNTER — Telehealth: Payer: Self-pay | Admitting: Cardiology

## 2015-01-27 MED ORDER — DILTIAZEM HCL 60 MG PO TABS: 30.0000 mg | ORAL_TABLET | Freq: Two times a day (BID) | ORAL | Status: DC | PRN

## 2015-01-27 MED ORDER — FUROSEMIDE 40 MG PO TABS: 40.0000 mg | ORAL_TABLET | Freq: Every day | ORAL | Status: DC | PRN

## 2015-01-27 NOTE — Telephone Encounter (Signed)
Mr. Fritsch is calling because his heart  Has been out of rhythm ..please call  Thanks

## 2015-01-27 NOTE — Telephone Encounter (Signed)
Pt notes HR up - 80-120 since yesterday afternoon. Took diltiazem PRN about 12:30am.  He has been taking diltiazem 6 out of past 11 days. Only uses 1-2 30mg  doses PRN.  Recently discontinued a supplement d/t stated concerns about interaction (see note 9/2) Pt taking diltiazem but notes this is an old bottle and he is concerned it is not working effectively. Verified current dose he is taking and updated med list. Sent refill to pharmacy.  He is not symptomatic at this time - notes no shortness of breath, dizziness, BP stable. Advised to get diltiazem refilled, take another dose w/ new supply, if unresolved or worse, call - can work in for appt if needed. Routing to Dr. Percival Spanish for further advice.

## 2015-01-30 ENCOUNTER — Ambulatory Visit (INDEPENDENT_AMBULATORY_CARE_PROVIDER_SITE_OTHER): Payer: BLUE CROSS/BLUE SHIELD | Admitting: *Deleted

## 2015-01-30 DIAGNOSIS — I4891 Unspecified atrial fibrillation: Secondary | ICD-10-CM

## 2015-01-30 DIAGNOSIS — Z5181 Encounter for therapeutic drug level monitoring: Secondary | ICD-10-CM | POA: Diagnosis not present

## 2015-01-30 DIAGNOSIS — Z7901 Long term (current) use of anticoagulants: Secondary | ICD-10-CM

## 2015-01-30 LAB — POCT INR: INR: 2.3

## 2015-01-30 NOTE — Telephone Encounter (Signed)
Agree 

## 2015-02-05 ENCOUNTER — Ambulatory Visit (INDEPENDENT_AMBULATORY_CARE_PROVIDER_SITE_OTHER): Payer: BLUE CROSS/BLUE SHIELD | Admitting: Cardiology

## 2015-02-05 ENCOUNTER — Encounter: Payer: Self-pay | Admitting: Cardiology

## 2015-02-05 VITALS — BP 128/74 | HR 71 | Ht 75.0 in | Wt 250.1 lb

## 2015-02-05 DIAGNOSIS — I480 Paroxysmal atrial fibrillation: Secondary | ICD-10-CM | POA: Diagnosis not present

## 2015-02-05 DIAGNOSIS — I10 Essential (primary) hypertension: Secondary | ICD-10-CM | POA: Diagnosis not present

## 2015-02-05 MED ORDER — DILTIAZEM HCL ER COATED BEADS 120 MG PO CP24: 120.0000 mg | ORAL_CAPSULE | Freq: Every day | ORAL | Status: DC

## 2015-02-05 NOTE — Progress Notes (Signed)
HPI The patient presents for followup of atrial flutter and atrial fibrillation.  He is status post flutter ablation.  He was having pain increased episodes of palpitations.   He called the last several days to discuss this and examined take frequent doses of when necessary Cardizem. He feels his heart racing. He doesn't know he's had any other significant change. He feels lightheaded with this. It is an uncomfortable feeling. He had some pain in his lower abdomen through to his back when this started but this isn't ongoing. He's not had any substernal chest pressure, neck or arm discomfort. He's not had any syncope. He has no new shortness of breath, PND or orthopnea.  Allergies  Allergen Reactions  . Adhesive [Tape]     itching  . Latex Itching    When tape is on the skin too long skin gets red & itching    Current Outpatient Prescriptions  Medication Sig Dispense Refill  . albuterol (PROVENTIL HFA;VENTOLIN HFA) 108 (90 BASE) MCG/ACT inhaler Inhale 2 puffs into the lungs every 6 (six) hours as needed for wheezing or shortness of breath.    . bisoprolol (ZEBETA) 10 MG tablet Take 10 mg by mouth daily.    . Cholecalciferol (VITAMIN D) 2000 UNITS CAPS Take 1 capsule by mouth daily.    [name] diltiazem (CARDIZEM) 60 MG tablet Take 0.5 tablets (30 mg total) by mouth 2 (two) times daily as needed (AFIB). 60 tablet 2  . furosemide (LASIX) 40 MG tablet Take 1 tablet (40 mg total) by mouth daily as needed for fluid. 30 tablet 2  . glimepiride (AMARYL) 1 MG tablet Take 1 mg by mouth daily before breakfast.     . HYDROcodone-acetaminophen (NORCO/VICODIN) 5-325 MG per tablet Take 1 tablet by mouth every 6 (six) hours as needed for pain.    . Liraglutide (VICTOZA) 18 MG/3ML SOPN Inject 12 mLs into the skin daily.     [name] lisinopril (PRINIVIL,ZESTRIL) 20 MG tablet Take 1 tablet (20 mg total) by mouth daily. 90 tablet 1  . metFORMIN (GLUCOPHAGE) 500 MG tablet Take 1,000 mg by mouth 2 (two) times daily with a  meal.     . MULTAQ 400 MG tablet TAKE 1 TABLET BY MOUTH TWICE DAILY WITH A MEAL 60 tablet 11  . Multiple Vitamin (MULTIVITAMIN WITH MINERALS) TABS Take 1 tablet by mouth daily.    [name] omeprazole (PRILOSEC) 20 MG capsule Take 20 mg by mouth 2 (two) times daily.     [name] oxyCODONE-acetaminophen (PERCOCET/ROXICET) 5-325 MG per tablet Take 1-2 tablets by mouth every 4 (four) hours as needed for moderate pain. 30 tablet 0  . potassium chloride (KLOR-CON 10) 10 MEQ tablet Take 10 mEq by mouth daily.     . pravastatin (PRAVACHOL) 80 MG tablet TAKE 1 TABLET BY MOUTH AT BEDTIME 30 tablet 5  . warfarin (COUMADIN) 5 MG tablet Take 5-7.5 mg by mouth daily. 7.5 mg M W F, And 5 mg all other days     No current facility-administered medications for this visit.    Past Medical History  Diagnosis Date  . HLD (hyperlipidemia)     takes Pravastatin daily  . Obesity   . Tobacco abuse   . Overdose 2009    unintentional Flacanide overdose  . HTN (hypertension)     takes Prinizide daily  . Dyspnea     LHC 11/08: EF 60%, normal coronary arteries;  ETT-echo 2/11: normal at 71% PMHR  . Shortness of breath  lying/sitting/exertion  . Seasonal allergies     was on Claritin-stopped taking this pt states it wasn't helping  . Coughing   . History of bronchitis 91yrs ago  . COPD (chronic obstructive pulmonary disease)   . Emphysema   . Peripheral edema     takes Furosemide as needed  . Hypokalemia     hx of;pt states he was taking K+ but now eats a banana daily instead  . Dizziness   . Short-term memory loss   . Arthritis     lower back  . Chronic back pain     DDD/stenosis  . Bruises easily     takes Coumadin daily  . GERD (gastroesophageal reflux disease)     takes Omeprazole bid  . Colon polyps     9 polyps removed 10/13/11  . Urinary frequency   . Urinary urgency   . Diabetes mellitus     takes Metformin and Glimepiride daily  . History of shingles   . Atrial flutter     s/p CTI ablation by Dr  Rayann Heman  . Paroxysmal atrial fibrillation   . Pneumonia 3-4 yrs ago    none recent  . OSA (obstructive sleep apnea)     not always using cpap  . Brain aneurysm 2009    questionable. A follow up CTA in 2009 showed no evidence of  . Peripheral neuropathy   . Complication of anesthesia September 11, 2012    slow to awaken after ablation    Past Surgical History  Procedure Laterality Date  . Neck surgery  40yrs ago  . Right shoulder surgery  4-20yrs ago    cyst removed  . Left inguinal hernia repair      as a child  . Tee without cardioversion  05/05/2011    Procedure: TRANSESOPHAGEAL ECHOCARDIOGRAM (TEE);  Surgeon: Lelon Perla, MD;  Location: [facility] ENDOSCOPY;  Service: Cardiovascular;  Laterality: N/A;  . Cardioversion  05/05/2011    Procedure: CARDIOVERSION;  Surgeon: Lelon Perla, MD;  Location: [facility] ENDOSCOPY;  Service: Cardiovascular;  Laterality: N/A;  . Throat surgery  4-64yrs ago    "thought " it was cancer but came back not  . Carpal tunnel release  99/2000    bilateral  . Appendectomy  2-79yrs ago  . Knee surgery  6-5yrs ago    left  . Cardiac catheterization  2008    no significant CAD  . Tonsillectomy    . Hernia repair    . Anterior cervical decomp/discectomy fusion  01/04/2012    Procedure: ANTERIOR CERVICAL DECOMPRESSION/DISCECTOMY FUSION 1 LEVEL/HARDWARE REMOVAL;  Surgeon: Ophelia Charter, MD;  Location: [place] NEURO ORS;  Service: Neurosurgery;  Laterality: N/A;  explore cervical fusion Cervical six - seven  with removal of codman plate anterior cervical decompression with fusion interbody prothesis plating and bonegraft  . Cardioversion Bilateral 07/26/2012    Procedure: CARDIOVERSION;  Surgeon: Minus Breeding, MD;  Location: [place];  Service: Cardiovascular;  Laterality: Bilateral;  . Atrial flutter ablation  09/11/2012    CTI ablation by Dr Rayann Heman  . Colonoscopy with propofol N/A 12/13/2012    Procedure: COLONOSCOPY WITH PROPOFOL;  Surgeon: Milus Banister, MD;   Location: [facility] ENDOSCOPY;  Service: Endoscopy;  Laterality: N/A;  . Nasal septoplasty w/ turbinoplasty Bilateral 02/19/2014    Procedure: NASAL SEPTOPLASTY WITH BILATERAL TURBINATE REDUCTION;  Surgeon: Jodi Marble, MD;  Location: [place];  Service: ENT;  Laterality: Bilateral;  . Atrial fibrillation ablation N/A 09/11/2012    Procedure:  ATRIAL FIBRILLATION ABLATION;  Surgeon: Thompson Grayer, MD;  Location: [facility] CATH LAB;  Service: Cardiovascular;  Laterality: N/A;    ROS:  Nocturia, peripheral neuropath.    Otherwise as stated in the HPI and negative for all other systems.  PHYSICAL EXAM BP 128/74 mmHg  Pulse 71  Ht 6\' 3"  (1.905 m)  Wt 250 lb 1.6 oz (113.445 kg)  BMI 31.26 kg/m2 GENERAL:  Well appearing HEENT:  Pupils equal round and reactive, fundi not visualized, oral mucosa , edentulous NECK:  No jugular venous distention, waveform within normal limits, carotid upstroke brisk and symmetric, no bruits, no thyromegaly LUNGS:  Clear to auscultation bilaterally, diminished BS with scattered wheezes. BACK:  No CVA tenderness CHEST:  Unremarkable HEART:  PMI not displaced or sustained,S1 and S2 within normal limits, no S3, no S4, no clicks, no rubs, no murmurs, regular ABD:  Flat, positive bowel sounds normal in frequency in pitch, no bruits, no rebound, no guarding, no midline pulsatile mass, no hepatomegaly, no splenomegaly EXT:  2 plus pulses throughout, no edema, no cyanosis no clubbing   EKG:  Sinus rhythm, rate 71, axis within normal limits, intervals within normal T wave changes. No change from previous.  02/05/2015  ASSESSMENT AND PLAN  ATRIAL FLUTTER/FIB:    He is having more symptomatic paroxysms. I reviewed extensive notes. He's had flutter ablation before. Dr. Rayann Heman  did discuss possible A. Fib ablation although he thought he would be a poor candidate.   He is not a Tikosyn candidate as he has a poor memory. He's had an abnormal stress test consult was taken off of flecainide. He has  chronic lung disease and wouldn't be anything amiodarone candidate. I'm going to add Cardizem CD 120 mg medical regimen. I'm going to check an echocardiogram. I will refer him back to Dr. Rayann Heman to discuss further.  HTN:   The blood pressure is at target.   TOBACCO ABUSE:  He says his only smoking a few cigarettes. We have again talked about the need to quit altogether  ALCOHOL USE:  Last drink Dec of last year.

## 2015-02-05 NOTE — Patient Instructions (Signed)
You have been referred to Dr Rayann Heman  Your physician has recommended you make the following change in your medication: Take Cardizem CD 120 mg daily  Your physician has requested that you have an echocardiogram. Echocardiography is a painless test that uses sound waves to create images of your heart. It provides your doctor with information about the size and shape of your heart and how well your heart's chambers and valves are working. This procedure takes approximately one hour. There are no restrictions for this procedure.

## 2015-02-06 ENCOUNTER — Telehealth: Payer: Self-pay | Admitting: *Deleted

## 2015-02-06 NOTE — Telephone Encounter (Signed)
Called and left vm regarding appointment with Dr. Lawerance Bach 02/11/15 @ 2:45pm.  Instructed patient to call and confirm her received the message.

## 2015-02-10 ENCOUNTER — Ambulatory Visit (HOSPITAL_COMMUNITY): Payer: BLUE CROSS/BLUE SHIELD

## 2015-02-11 ENCOUNTER — Ambulatory Visit (INDEPENDENT_AMBULATORY_CARE_PROVIDER_SITE_OTHER): Payer: BLUE CROSS/BLUE SHIELD | Admitting: Internal Medicine

## 2015-02-11 ENCOUNTER — Encounter: Payer: Self-pay | Admitting: *Deleted

## 2015-02-11 ENCOUNTER — Ambulatory Visit (HOSPITAL_COMMUNITY): Payer: BLUE CROSS/BLUE SHIELD | Attending: Cardiology

## 2015-02-11 ENCOUNTER — Other Ambulatory Visit: Payer: Self-pay

## 2015-02-11 ENCOUNTER — Encounter: Payer: Self-pay | Admitting: Internal Medicine

## 2015-02-11 VITALS — BP 148/80 | HR 74 | Ht 75.0 in | Wt 255.6 lb

## 2015-02-11 DIAGNOSIS — G4733 Obstructive sleep apnea (adult) (pediatric): Secondary | ICD-10-CM

## 2015-02-11 DIAGNOSIS — F17200 Nicotine dependence, unspecified, uncomplicated: Secondary | ICD-10-CM | POA: Diagnosis not present

## 2015-02-11 DIAGNOSIS — I480 Paroxysmal atrial fibrillation: Secondary | ICD-10-CM

## 2015-02-11 DIAGNOSIS — I071 Rheumatic tricuspid insufficiency: Secondary | ICD-10-CM | POA: Diagnosis not present

## 2015-02-11 DIAGNOSIS — I340 Nonrheumatic mitral (valve) insufficiency: Secondary | ICD-10-CM | POA: Diagnosis not present

## 2015-02-11 DIAGNOSIS — I358 Other nonrheumatic aortic valve disorders: Secondary | ICD-10-CM | POA: Diagnosis not present

## 2015-02-11 DIAGNOSIS — I10 Essential (primary) hypertension: Secondary | ICD-10-CM

## 2015-02-11 DIAGNOSIS — I4891 Unspecified atrial fibrillation: Secondary | ICD-10-CM | POA: Diagnosis present

## 2015-02-11 DIAGNOSIS — Z720 Tobacco use: Secondary | ICD-10-CM | POA: Diagnosis not present

## 2015-02-11 DIAGNOSIS — I517 Cardiomegaly: Secondary | ICD-10-CM | POA: Insufficient documentation

## 2015-02-11 DIAGNOSIS — E119 Type 2 diabetes mellitus without complications: Secondary | ICD-10-CM | POA: Insufficient documentation

## 2015-02-11 NOTE — Progress Notes (Signed)
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  He has recently noticed increasing frequency and duration of his atrial fibrillation.  He reports symptoms of palpitations and fatigue.  He reports compliance with medical therapy. Today, he denies symptoms of chest pain, shortness of breath, or  lower extremity edema.  He continues to smoke but is trying to cut back/ quit.  The patient is otherwise without complaint today.   Past Medical History  Diagnosis Date  . HLD (hyperlipidemia)     takes Pravastatin daily  . Obesity   . Tobacco abuse   . Overdose 2009    unintentional Flacanide overdose  . HTN (hypertension)     takes Prinizide daily  . Dyspnea     LHC 11/08: EF 60%, normal coronary arteries;  ETT-echo 2/11: normal at 71% PMHR  . Seasonal allergies     was on Claritin-stopped taking this pt states it wasn't helping  . Emphysema   . Short-term memory loss   . Arthritis     lower back  . Chronic back pain     DDD/stenosis  . GERD (gastroesophageal reflux disease)     takes Omeprazole bid  . Colon polyps     9 polyps removed 10/13/11  . Diabetes mellitus     takes Metformin and Glimepiride daily  . History of shingles   . Atrial flutter     s/p CTI ablation by Dr Rayann Heman  . Paroxysmal atrial fibrillation   . Pneumonia 3-4 yrs ago    none recent  . OSA (obstructive sleep apnea)     not always using cpap  . Brain aneurysm 2009    questionable. A follow up CTA in 2009 showed no evidence of  . Peripheral neuropathy   . Complication of anesthesia September 11, 2012    slow to awaken after ablation   Past Surgical History  Procedure Laterality Date  . Neck surgery  76yrs ago  . Right shoulder surgery  4-71yrs ago    cyst removed  . Left inguinal hernia repair      as a child  . Tee without cardioversion  05/05/2011    Procedure: TRANSESOPHAGEAL ECHOCARDIOGRAM (TEE);  Surgeon:  Lelon Perla, MD;  Location: [facility] ENDOSCOPY;  Service: Cardiovascular;  Laterality: N/A;  . Cardioversion  05/05/2011    Procedure: CARDIOVERSION;  Surgeon: Lelon Perla, MD;  Location: [facility] ENDOSCOPY;  Service: Cardiovascular;  Laterality: N/A;  . Throat surgery  4-76yrs ago    "thought " it was cancer but came back not  . Carpal tunnel release  99/2000    bilateral  . Appendectomy  2-1yrs ago  . Knee surgery  6-47yrs ago    left  . Cardiac catheterization  2008    no significant CAD  . Tonsillectomy    . Hernia repair    . Anterior cervical decomp/discectomy fusion  01/04/2012    Procedure: ANTERIOR CERVICAL DECOMPRESSION/DISCECTOMY FUSION 1 LEVEL/HARDWARE REMOVAL;  Surgeon: Ophelia Charter, MD;  Location: [place] NEURO ORS;  Service: Neurosurgery;  Laterality: N/A;  explore cervical fusion Cervical six - seven  with removal of codman plate anterior cervical decompression with fusion interbody prothesis plating and bonegraft  . Cardioversion Bilateral 07/26/2012    Procedure: CARDIOVERSION;  Surgeon: Minus Breeding, MD;  Location: [place];  Service: Cardiovascular;  Laterality: Bilateral;  . Atrial flutter ablation  09/11/2012    CTI ablation by  Dr Rayann Heman  . Colonoscopy with propofol N/A 12/13/2012    Procedure: COLONOSCOPY WITH PROPOFOL;  Surgeon: Milus Banister, MD;  Location: [facility] ENDOSCOPY;  Service: Endoscopy;  Laterality: N/A;  . Nasal septoplasty w/ turbinoplasty Bilateral 02/19/2014    Procedure: NASAL SEPTOPLASTY WITH BILATERAL TURBINATE REDUCTION;  Surgeon: Jodi Marble, MD;  Location: [place];  Service: ENT;  Laterality: Bilateral;  . Atrial fibrillation ablation N/A 09/11/2012    PT DID NOT HAVE AN ATRIAL FIBRILLATION ABLATION IN 2014!  ATRIAL FLUTTER ABLATION ONLY    Current Outpatient Prescriptions  Medication Sig Dispense Refill  . albuterol (PROVENTIL HFA;VENTOLIN HFA) 108 (90 BASE) MCG/ACT inhaler Inhale 2 puffs into the lungs every 6 (six) hours as needed for wheezing or  shortness of breath.    . bisoprolol (ZEBETA) 10 MG tablet Take 10 mg by mouth daily.    . Cholecalciferol (VITAMIN D) 2000 UNITS CAPS Take 1 capsule by mouth daily.    [name] diltiazem (CARDIZEM CD) 120 MG 24 hr capsule Take 1 capsule (120 mg total) by mouth daily. 30 capsule 6  . furosemide (LASIX) 40 MG tablet Take 1 tablet (40 mg total) by mouth daily as needed for fluid. 30 tablet 2  . glimepiride (AMARYL) 1 MG tablet Take 1 mg by mouth daily before breakfast.     . HYDROcodone-acetaminophen (NORCO/VICODIN) 5-325 MG per tablet Take 1 tablet by mouth every 6 (six) hours as needed for pain.    . Liraglutide (VICTOZA) 18 MG/3ML SOPN Inject 12 mLs into the skin daily.     [name] lisinopril (PRINIVIL,ZESTRIL) 20 MG tablet Take 1 tablet (20 mg total) by mouth daily. 90 tablet 1  . metFORMIN (GLUCOPHAGE) 500 MG tablet Take 1,000 mg by mouth 2 (two) times daily with a meal.     . MULTAQ 400 MG tablet TAKE 1 TABLET BY MOUTH TWICE DAILY WITH A MEAL 60 tablet 11  . Multiple Vitamin (MULTIVITAMIN WITH MINERALS) TABS Take 1 tablet by mouth daily.    [name] omeprazole (PRILOSEC) 20 MG capsule Take 20 mg by mouth 2 (two) times daily.     [name] oxyCODONE-acetaminophen (PERCOCET/ROXICET) 5-325 MG per tablet Take 1-2 tablets by mouth every 4 (four) hours as needed for moderate pain. 30 tablet 0  . potassium chloride (KLOR-CON 10) 10 MEQ tablet Take 10 mEq by mouth daily.     . pravastatin (PRAVACHOL) 80 MG tablet TAKE 1 TABLET BY MOUTH AT BEDTIME 30 tablet 5  . warfarin (COUMADIN) 5 MG tablet Take 5-7.5 mg by mouth daily. 7.5 mg M W F, And 5 mg all other days    . diltiazem (CARDIZEM) 60 MG tablet Take 0.5 tablets (30 mg total) by mouth 2 (two) times daily as needed (AFIB). (Patient not taking: Reported on 02/11/2015) 60 tablet 2   No current facility-administered medications for this visit.    Physical Exam: Filed Vitals:   02/11/15 1451  BP: 148/80  Pulse: 74  Height: 6\' 3"  (1.905 m)  Weight: 255 lb 9.6 oz (115.939 kg)     GEN- The patient is overweight and chronically ill appearing, alert and oriented x 3 today.   Head- normocephalic, atraumatic Eyes-  Sclera clear, conjunctiva pink Ears- hearing intact Oropharynx- clear Lungs- prolonged expiratory wheezing,  Heart- Regular rate and rhythm, no murmurs, rubs or gallops, PMI not laterally displaced GI- soft, NT, ND, + BS Extremities- no clubbing, cyanosis, or edema,    ekg today reveals sinus rhythm Echo reveals LA 68mm, preserved EF  Assessment and Plan:  1. Paroxysmal Atrial fibrillation S/p atrial flutter ablation in 2014.  He now has afib.  He has failed medical therapy with multaq.  I agree with Dr Percival Spanish (note reviewed) that his AAD options are few.  Therapeutic strategies for afib including medicine and ablation were discussed in detail with the patient today. Risk, benefits, and alternatives to EP study and radiofrequency ablation for afib were also discussed in detail today. These risks include but are not limited to stroke, bleeding, vascular damage, tamponade, perforation, damage to the esophagus, lungs, and other structures, pulmonary vein stenosis, worsening renal function, and death. The patient understands these risk and wishes to proceed.  We will therefore proceed with catheter ablation at the next available time.  2. OSA Stable No change required today  3. Tobacco Cessation strongly advised He is making plans to quit  4. Hypertension Stable No change required today

## 2015-02-11 NOTE — Patient Instructions (Addendum)
Medication Instructions:  Your physician recommends that you continue on your current medications as directed. Please refer to the Current Medication list given to you today.     Labwork: Your physician recommends that you return for lab work on 03/12/15 at 9:00am Will need weekly INR's in Coumadin clinic starting week of 02/16/15---they will call you with times   Testing/Procedures: Your physician has recommended that you have an ablation. Catheter ablation is a medical procedure used to treat some cardiac arrhythmias (irregular heartbeats). During catheter ablation, a long, thin, flexible tube is put into a blood vessel in your groin (upper thigh), or neck. This tube is called an ablation catheter. It is then guided to your heart through the blood vessel. Radio frequency waves destroy small areas of heart tissue where abnormal heartbeats may cause an arrhythmia to start. Please see the instruction sheet given to you today.    Follow-Up: Your physician recommends that you schedule a follow-up appointment in: 4 weeks from 03/19/15 with Ryan Palau, NP and 3 months from 03/19/15 with [name]    Any Other Special Instructions Will Be Listed Below (If Applicable).

## 2015-02-16 ENCOUNTER — Ambulatory Visit (INDEPENDENT_AMBULATORY_CARE_PROVIDER_SITE_OTHER): Payer: BLUE CROSS/BLUE SHIELD

## 2015-02-16 DIAGNOSIS — Z7901 Long term (current) use of anticoagulants: Secondary | ICD-10-CM | POA: Diagnosis not present

## 2015-02-16 DIAGNOSIS — Z5181 Encounter for therapeutic drug level monitoring: Secondary | ICD-10-CM | POA: Diagnosis not present

## 2015-02-16 DIAGNOSIS — I4891 Unspecified atrial fibrillation: Secondary | ICD-10-CM

## 2015-02-16 LAB — POCT INR: INR: 2

## 2015-02-18 ENCOUNTER — Other Ambulatory Visit (HOSPITAL_COMMUNITY): Payer: BLUE CROSS/BLUE SHIELD

## 2015-02-23 ENCOUNTER — Ambulatory Visit (INDEPENDENT_AMBULATORY_CARE_PROVIDER_SITE_OTHER): Payer: BLUE CROSS/BLUE SHIELD | Admitting: *Deleted

## 2015-02-23 DIAGNOSIS — Z5181 Encounter for therapeutic drug level monitoring: Secondary | ICD-10-CM

## 2015-02-23 DIAGNOSIS — Z7901 Long term (current) use of anticoagulants: Secondary | ICD-10-CM | POA: Diagnosis not present

## 2015-02-23 DIAGNOSIS — I4891 Unspecified atrial fibrillation: Secondary | ICD-10-CM

## 2015-02-23 LAB — POCT INR: INR: 2.4

## 2015-03-03 ENCOUNTER — Ambulatory Visit (INDEPENDENT_AMBULATORY_CARE_PROVIDER_SITE_OTHER): Payer: BLUE CROSS/BLUE SHIELD

## 2015-03-03 DIAGNOSIS — Z5181 Encounter for therapeutic drug level monitoring: Secondary | ICD-10-CM | POA: Diagnosis not present

## 2015-03-03 DIAGNOSIS — Z7901 Long term (current) use of anticoagulants: Secondary | ICD-10-CM | POA: Diagnosis not present

## 2015-03-03 DIAGNOSIS — I4891 Unspecified atrial fibrillation: Secondary | ICD-10-CM

## 2015-03-03 LAB — POCT INR: INR: 2.4

## 2015-03-12 ENCOUNTER — Other Ambulatory Visit: Payer: BLUE CROSS/BLUE SHIELD | Admitting: *Deleted

## 2015-03-12 ENCOUNTER — Ambulatory Visit (INDEPENDENT_AMBULATORY_CARE_PROVIDER_SITE_OTHER): Payer: BLUE CROSS/BLUE SHIELD | Admitting: *Deleted

## 2015-03-12 DIAGNOSIS — Z7901 Long term (current) use of anticoagulants: Secondary | ICD-10-CM

## 2015-03-12 DIAGNOSIS — I4891 Unspecified atrial fibrillation: Secondary | ICD-10-CM

## 2015-03-12 DIAGNOSIS — Z5181 Encounter for therapeutic drug level monitoring: Secondary | ICD-10-CM

## 2015-03-12 DIAGNOSIS — I480 Paroxysmal atrial fibrillation: Secondary | ICD-10-CM

## 2015-03-12 LAB — BASIC METABOLIC PANEL
BUN: 10 mg/dL (ref 7–25)
CHLORIDE: 103 mmol/L (ref 98–110)
CO2: 23 mmol/L (ref 20–31)
CREATININE: 0.77 mg/dL (ref 0.70–1.33)
Calcium: 9.2 mg/dL (ref 8.6–10.3)
Glucose, Bld: 176 mg/dL — ABNORMAL HIGH (ref 65–99)
Potassium: 4.1 mmol/L (ref 3.5–5.3)
Sodium: 142 mmol/L (ref 135–146)

## 2015-03-12 LAB — CBC
HCT: 42.1 % (ref 39.0–52.0)
Hemoglobin: 14.3 g/dL (ref 13.0–17.0)
MCH: 30.3 pg (ref 26.0–34.0)
MCHC: 34 g/dL (ref 30.0–36.0)
MCV: 89.2 fL (ref 78.0–100.0)
MPV: 10.5 fL (ref 8.6–12.4)
PLATELETS: 151 10*3/uL (ref 150–400)
RBC: 4.72 MIL/uL (ref 4.22–5.81)
RDW: 13.8 % (ref 11.5–15.5)
WBC: 6.8 10*3/uL (ref 4.0–10.5)

## 2015-03-12 LAB — POCT INR: INR: 2

## 2015-03-13 ENCOUNTER — Telehealth: Payer: Self-pay | Admitting: *Deleted

## 2015-03-13 NOTE — Telephone Encounter (Addendum)
Spoke with patient in regards to moving his Afib ablation to 04/21/15.  He is to be at the hospital on 12/6 at 7:00 am.  Will do both TEE and ablation  I will  Have [facility] clinic call him with apt's for 11/15, 11/22, and 11/29.  Will cancel the one for 11/01.  He is aware someone will call him to reschedule.  As I was talking to him and discussing the change, he proceeds to tell me about other issues including blood in stool with every BM and having a peculiar taste in his mouth.  He says he had a procedure on his esophagus several  Years ago and the taste he is having was the same back then.  I have encouraged him to contact his PCP and also Dr Ardis Hughs who did his Colonoscopy in 2014.  He will call them and let me know when he is being seen with both.  Message sent to [facility] clinic to cancel his appointment for 11/01 and dates to reschedule

## 2015-03-16 ENCOUNTER — Telehealth: Payer: Self-pay | Admitting: Internal Medicine

## 2015-03-16 NOTE — Telephone Encounter (Signed)
Will push out until after afib ablation.  Melissa will call to reschedule

## 2015-03-16 NOTE — Telephone Encounter (Signed)
New Message   They changed his surgery date to dec 6  He was expecting an appt with Dr.Allred for the nov 2nd for the surgery   He wants to know do he still come to that appt or to we push it out for the surgery?

## 2015-03-25 ENCOUNTER — Other Ambulatory Visit: Payer: Self-pay | Admitting: Cardiology

## 2015-03-31 ENCOUNTER — Ambulatory Visit (INDEPENDENT_AMBULATORY_CARE_PROVIDER_SITE_OTHER): Payer: BLUE CROSS/BLUE SHIELD | Admitting: *Deleted

## 2015-03-31 DIAGNOSIS — I4891 Unspecified atrial fibrillation: Secondary | ICD-10-CM | POA: Diagnosis not present

## 2015-03-31 DIAGNOSIS — Z5181 Encounter for therapeutic drug level monitoring: Secondary | ICD-10-CM | POA: Diagnosis not present

## 2015-03-31 DIAGNOSIS — Z7901 Long term (current) use of anticoagulants: Secondary | ICD-10-CM | POA: Diagnosis not present

## 2015-03-31 LAB — POCT INR: INR: 2.1

## 2015-04-07 ENCOUNTER — Ambulatory Visit (INDEPENDENT_AMBULATORY_CARE_PROVIDER_SITE_OTHER): Payer: BLUE CROSS/BLUE SHIELD | Admitting: *Deleted

## 2015-04-07 DIAGNOSIS — Z7901 Long term (current) use of anticoagulants: Secondary | ICD-10-CM

## 2015-04-07 DIAGNOSIS — I4891 Unspecified atrial fibrillation: Secondary | ICD-10-CM | POA: Diagnosis not present

## 2015-04-07 DIAGNOSIS — Z5181 Encounter for therapeutic drug level monitoring: Secondary | ICD-10-CM

## 2015-04-07 LAB — POCT INR: INR: 1.6

## 2015-04-11 ENCOUNTER — Other Ambulatory Visit: Payer: Self-pay | Admitting: Cardiology

## 2015-04-13 NOTE — Telephone Encounter (Signed)
REFILL 

## 2015-04-14 ENCOUNTER — Ambulatory Visit (INDEPENDENT_AMBULATORY_CARE_PROVIDER_SITE_OTHER): Payer: BLUE CROSS/BLUE SHIELD | Admitting: Pharmacist

## 2015-04-14 ENCOUNTER — Ambulatory Visit (HOSPITAL_COMMUNITY): Payer: BLUE CROSS/BLUE SHIELD | Admitting: Nurse Practitioner

## 2015-04-14 DIAGNOSIS — Z5181 Encounter for therapeutic drug level monitoring: Secondary | ICD-10-CM | POA: Diagnosis not present

## 2015-04-14 DIAGNOSIS — Z7901 Long term (current) use of anticoagulants: Secondary | ICD-10-CM | POA: Diagnosis not present

## 2015-04-14 DIAGNOSIS — I4891 Unspecified atrial fibrillation: Secondary | ICD-10-CM | POA: Diagnosis not present

## 2015-04-14 LAB — POCT INR: INR: 2

## 2015-04-20 ENCOUNTER — Ambulatory Visit (INDEPENDENT_AMBULATORY_CARE_PROVIDER_SITE_OTHER): Payer: BLUE CROSS/BLUE SHIELD | Admitting: *Deleted

## 2015-04-20 ENCOUNTER — Other Ambulatory Visit: Payer: Self-pay | Admitting: *Deleted

## 2015-04-20 ENCOUNTER — Other Ambulatory Visit (INDEPENDENT_AMBULATORY_CARE_PROVIDER_SITE_OTHER): Payer: BLUE CROSS/BLUE SHIELD | Admitting: *Deleted

## 2015-04-20 DIAGNOSIS — I4891 Unspecified atrial fibrillation: Secondary | ICD-10-CM | POA: Diagnosis not present

## 2015-04-20 DIAGNOSIS — Z5181 Encounter for therapeutic drug level monitoring: Secondary | ICD-10-CM | POA: Diagnosis not present

## 2015-04-20 DIAGNOSIS — I480 Paroxysmal atrial fibrillation: Secondary | ICD-10-CM

## 2015-04-20 DIAGNOSIS — Z7901 Long term (current) use of anticoagulants: Secondary | ICD-10-CM | POA: Diagnosis not present

## 2015-04-20 LAB — CBC WITH DIFFERENTIAL/PLATELET
BASOS PCT: 0 % (ref 0–1)
Basophils Absolute: 0 10*3/uL (ref 0.0–0.1)
Eosinophils Absolute: 0.1 10*3/uL (ref 0.0–0.7)
Eosinophils Relative: 2 % (ref 0–5)
HEMATOCRIT: 41.7 % (ref 39.0–52.0)
HEMOGLOBIN: 14 g/dL (ref 13.0–17.0)
LYMPHS ABS: 2.2 10*3/uL (ref 0.7–4.0)
LYMPHS PCT: 34 % (ref 12–46)
MCH: 29.9 pg (ref 26.0–34.0)
MCHC: 33.6 g/dL (ref 30.0–36.0)
MCV: 89.1 fL (ref 78.0–100.0)
MONO ABS: 0.4 10*3/uL (ref 0.1–1.0)
MONOS PCT: 6 % (ref 3–12)
MPV: 10.5 fL (ref 8.6–12.4)
NEUTROS ABS: 3.7 10*3/uL (ref 1.7–7.7)
Neutrophils Relative %: 58 % (ref 43–77)
Platelets: 164 10*3/uL (ref 150–400)
RBC: 4.68 MIL/uL (ref 4.22–5.81)
RDW: 14.7 % (ref 11.5–15.5)
WBC: 6.4 10*3/uL (ref 4.0–10.5)

## 2015-04-20 LAB — BASIC METABOLIC PANEL
BUN: 10 mg/dL (ref 7–25)
CALCIUM: 9 mg/dL (ref 8.6–10.3)
CO2: 23 mmol/L (ref 20–31)
Chloride: 109 mmol/L (ref 98–110)
Creat: 0.75 mg/dL (ref 0.70–1.33)
GLUCOSE: 150 mg/dL — AB (ref 65–99)
Potassium: 4.1 mmol/L (ref 3.5–5.3)
SODIUM: 141 mmol/L (ref 135–146)

## 2015-04-20 LAB — POCT INR: INR: 3

## 2015-04-21 ENCOUNTER — Ambulatory Visit (HOSPITAL_COMMUNITY): Payer: BLUE CROSS/BLUE SHIELD | Admitting: Anesthesiology

## 2015-04-21 ENCOUNTER — Ambulatory Visit (HOSPITAL_BASED_OUTPATIENT_CLINIC_OR_DEPARTMENT_OTHER)
Admission: RE | Admit: 2015-04-21 | Discharge: 2015-04-21 | Disposition: A | Discharge status: Home / Self Care | Payer: BLUE CROSS/BLUE SHIELD | Source: Ambulatory Visit | Attending: Internal Medicine | Admitting: Internal Medicine

## 2015-04-21 ENCOUNTER — Encounter (HOSPITAL_COMMUNITY)
Admission: RE | Disposition: A | Discharge status: Home / Self Care | Payer: Self-pay | Source: Ambulatory Visit | Attending: Cardiology

## 2015-04-21 ENCOUNTER — Encounter (HOSPITAL_COMMUNITY): Payer: Self-pay

## 2015-04-21 ENCOUNTER — Ambulatory Visit (HOSPITAL_COMMUNITY)
Admission: RE | Admit: 2015-04-21 | Discharge: 2015-04-22 | Disposition: A | Discharge status: Home / Self Care | Payer: BLUE CROSS/BLUE SHIELD | Source: Ambulatory Visit | Attending: Cardiology | Admitting: Cardiology

## 2015-04-21 ENCOUNTER — Ambulatory Visit (HOSPITAL_COMMUNITY)
Admission: RE | Admit: 2015-04-21 | Payer: BLUE CROSS/BLUE SHIELD | Source: Ambulatory Visit | Admitting: Internal Medicine

## 2015-04-21 DIAGNOSIS — I4891 Unspecified atrial fibrillation: Secondary | ICD-10-CM

## 2015-04-21 DIAGNOSIS — I480 Paroxysmal atrial fibrillation: Secondary | ICD-10-CM | POA: Diagnosis not present

## 2015-04-21 DIAGNOSIS — F17200 Nicotine dependence, unspecified, uncomplicated: Secondary | ICD-10-CM | POA: Insufficient documentation

## 2015-04-21 DIAGNOSIS — E1140 Type 2 diabetes mellitus with diabetic neuropathy, unspecified: Secondary | ICD-10-CM | POA: Diagnosis not present

## 2015-04-21 DIAGNOSIS — I4892 Unspecified atrial flutter: Secondary | ICD-10-CM | POA: Diagnosis present

## 2015-04-21 DIAGNOSIS — M1990 Unspecified osteoarthritis, unspecified site: Secondary | ICD-10-CM | POA: Diagnosis not present

## 2015-04-21 DIAGNOSIS — Z23 Encounter for immunization: Secondary | ICD-10-CM | POA: Diagnosis not present

## 2015-04-21 DIAGNOSIS — I10 Essential (primary) hypertension: Secondary | ICD-10-CM | POA: Diagnosis not present

## 2015-04-21 DIAGNOSIS — Z7984 Long term (current) use of oral hypoglycemic drugs: Secondary | ICD-10-CM | POA: Diagnosis not present

## 2015-04-21 DIAGNOSIS — I4811 Longstanding persistent atrial fibrillation: Secondary | ICD-10-CM | POA: Diagnosis present

## 2015-04-21 DIAGNOSIS — E785 Hyperlipidemia, unspecified: Secondary | ICD-10-CM | POA: Insufficient documentation

## 2015-04-21 DIAGNOSIS — G4733 Obstructive sleep apnea (adult) (pediatric): Secondary | ICD-10-CM | POA: Diagnosis not present

## 2015-04-21 DIAGNOSIS — I340 Nonrheumatic mitral (valve) insufficiency: Secondary | ICD-10-CM | POA: Diagnosis not present

## 2015-04-21 DIAGNOSIS — J449 Chronic obstructive pulmonary disease, unspecified: Secondary | ICD-10-CM | POA: Diagnosis not present

## 2015-04-21 DIAGNOSIS — Z7901 Long term (current) use of anticoagulants: Secondary | ICD-10-CM | POA: Insufficient documentation

## 2015-04-21 DIAGNOSIS — I483 Typical atrial flutter: Secondary | ICD-10-CM | POA: Diagnosis not present

## 2015-04-21 HISTORY — PX: ELECTROPHYSIOLOGIC STUDY: SHX172A

## 2015-04-21 HISTORY — PX: TEE WITHOUT CARDIOVERSION: SHX5443

## 2015-04-21 LAB — PROTIME-INR
INR: 2.94 — ABNORMAL HIGH (ref 0.00–1.49)
Prothrombin Time: 30.1 seconds — ABNORMAL HIGH (ref 11.6–15.2)

## 2015-04-21 LAB — POCT ACTIVATED CLOTTING TIME
ACTIVATED CLOTTING TIME: 281 s
Activated Clotting Time: 260 seconds
Activated Clotting Time: 276 seconds
Activated Clotting Time: 240 seconds
Activated Clotting Time: 183 s

## 2015-04-21 LAB — GLUCOSE, CAPILLARY
Glucose-Capillary: 105 mg/dL — ABNORMAL HIGH (ref 65–99)
Glucose-Capillary: 97 mg/dL (ref 65–99)

## 2015-04-21 SURGERY — ATRIAL FIBRILLATION ABLATION
Anesthesia: General

## 2015-04-21 SURGERY — ECHOCARDIOGRAM, TRANSESOPHAGEAL
Anesthesia: Moderate Sedation

## 2015-04-21 MED ORDER — GLIMEPIRIDE 1 MG PO TABS
1.0000 mg | ORAL_TABLET | Freq: Every day | ORAL | Status: DC
  Administered 2015-04-22: 1 mg via ORAL
  Filled 2015-04-21 (×2): qty 1

## 2015-04-21 MED ORDER — HYDROCODONE-ACETAMINOPHEN 5-325 MG PO TABS: 1.0000 | ORAL_TABLET | ORAL | Status: DC | PRN

## 2015-04-21 MED ORDER — OXYCODONE-ACETAMINOPHEN 5-325 MG PO TABS
1.0000 | ORAL_TABLET | ORAL | Status: DC | PRN
  Administered 2015-04-21: 2 via ORAL
  Filled 2015-04-21 (×2): qty 2

## 2015-04-21 MED ORDER — IOHEXOL 350 MG/ML SOLN
INTRAVENOUS | Status: DC | PRN
  Administered 2015-04-21: 101 mL via INTRAVENOUS

## 2015-04-21 MED ORDER — ACETAMINOPHEN 325 MG PO TABS: 650.0000 mg | ORAL_TABLET | ORAL | Status: DC | PRN

## 2015-04-21 MED ORDER — FENTANYL CITRATE (PF) 100 MCG/2ML IJ SOLN
INTRAMUSCULAR | Status: DC | PRN
  Administered 2015-04-21 (×2): 25 ug via INTRAVENOUS

## 2015-04-21 MED ORDER — FENTANYL CITRATE (PF) 100 MCG/2ML IJ SOLN
INTRAMUSCULAR | Status: DC | PRN
  Administered 2015-04-21: 25 ug via INTRAVENOUS
  Administered 2015-04-21: 50 ug via INTRAVENOUS
  Administered 2015-04-21: 25 ug via INTRAVENOUS

## 2015-04-21 MED ORDER — PANTOPRAZOLE SODIUM 40 MG PO TBEC
40.0000 mg | DELAYED_RELEASE_TABLET | Freq: Every day | ORAL | Status: DC
  Administered 2015-04-21: 40 mg via ORAL
  Filled 2015-04-21: qty 1

## 2015-04-21 MED ORDER — BUTAMBEN-TETRACAINE-BENZOCAINE 2-2-14 % EX AERO
INHALATION_SPRAY | CUTANEOUS | Status: DC | PRN
  Administered 2015-04-21: 2 via TOPICAL

## 2015-04-21 MED ORDER — METOPROLOL TARTRATE 1 MG/ML IV SOLN
INTRAVENOUS | Status: AC
  Filled 2015-04-21: qty 5

## 2015-04-21 MED ORDER — LISINOPRIL 20 MG PO TABS: 20.0000 mg | ORAL_TABLET | Freq: Every day | ORAL | Status: DC

## 2015-04-21 MED ORDER — SODIUM CHLORIDE 0.9 % IV SOLN: 250.0000 mL | INTRAVENOUS | Status: DC | PRN

## 2015-04-21 MED ORDER — HEPARIN SODIUM (PORCINE) 1000 UNIT/ML IJ SOLN
INTRAMUSCULAR | Status: DC | PRN
  Administered 2015-04-21: 1000 [IU] via INTRAVENOUS
  Administered 2015-04-21 (×2): 1000 [IU]

## 2015-04-21 MED ORDER — MIDAZOLAM HCL 10 MG/2ML IJ SOLN
INTRAMUSCULAR | Status: DC | PRN
  Administered 2015-04-21: 2 mg via INTRAVENOUS
  Administered 2015-04-21: 1 mg via INTRAVENOUS

## 2015-04-21 MED ORDER — SODIUM CHLORIDE 0.9 % IJ SOLN
3.0000 mL | Freq: Two times a day (BID) | INTRAMUSCULAR | Status: DC
  Administered 2015-04-21 – 2015-04-22 (×2): 3 mL via INTRAVENOUS

## 2015-04-21 MED ORDER — LIDOCAINE HCL (CARDIAC) 20 MG/ML IV SOLN
INTRAVENOUS | Status: DC | PRN
  Administered 2015-04-21: 20 mg via INTRAVENOUS

## 2015-04-21 MED ORDER — METOPROLOL TARTRATE 1 MG/ML IV SOLN
INTRAVENOUS | Status: DC | PRN
  Administered 2015-04-21: 2.5 mg via INTRAVENOUS

## 2015-04-21 MED ORDER — SODIUM CHLORIDE 0.9 % IJ SOLN: 3.0000 mL | Freq: Two times a day (BID) | INTRAMUSCULAR | Status: DC

## 2015-04-21 MED ORDER — FENTANYL CITRATE (PF) 100 MCG/2ML IJ SOLN
INTRAMUSCULAR | Status: AC
  Filled 2015-04-21: qty 2

## 2015-04-21 MED ORDER — SODIUM CHLORIDE 0.9 % IJ SOLN: 3.0000 mL | INTRAMUSCULAR | Status: DC | PRN

## 2015-04-21 MED ORDER — HEPARIN SODIUM (PORCINE) 1000 UNIT/ML IJ SOLN
INTRAMUSCULAR | Status: DC | PRN
  Administered 2015-04-21 (×2): 3000 [IU] via INTRAVENOUS
  Administered 2015-04-21: 10000 [IU] via INTRAVENOUS
  Administered 2015-04-21: 1000 [IU] via INTRAVENOUS
  Administered 2015-04-21: 2000 [IU] via INTRAVENOUS

## 2015-04-21 MED ORDER — PROPOFOL 500 MG/50ML IV EMUL
INTRAVENOUS | Status: DC | PRN
  Administered 2015-04-21: 10 ug/kg/min via INTRAVENOUS

## 2015-04-21 MED ORDER — ONDANSETRON HCL 4 MG/2ML IJ SOLN
INTRAMUSCULAR | Status: DC | PRN
  Administered 2015-04-21: 4 mg via INTRAVENOUS

## 2015-04-21 MED ORDER — ONDANSETRON HCL 4 MG/2ML IJ SOLN: 4.0000 mg | Freq: Four times a day (QID) | INTRAMUSCULAR | Status: DC | PRN

## 2015-04-21 MED ORDER — HYDROCODONE-ACETAMINOPHEN 5-325 MG PO TABS: 1.0000 | ORAL_TABLET | Freq: Four times a day (QID) | ORAL | Status: DC | PRN

## 2015-04-21 MED ORDER — PROPOFOL 10 MG/ML IV BOLUS
INTRAVENOUS | Status: DC | PRN
  Administered 2015-04-21: 20 mg via INTRAVENOUS
  Administered 2015-04-21: 150 mg via INTRAVENOUS

## 2015-04-21 MED ORDER — LACTATED RINGERS IV SOLN
INTRAVENOUS | Status: DC | PRN
  Administered 2015-04-21 (×2): via INTRAVENOUS

## 2015-04-21 MED ORDER — BUPIVACAINE HCL (PF) 0.25 % IJ SOLN
INTRAMUSCULAR | Status: AC
  Filled 2015-04-21: qty 30

## 2015-04-21 MED ORDER — DIPHENHYDRAMINE HCL 50 MG/ML IJ SOLN
INTRAMUSCULAR | Status: AC
  Filled 2015-04-21: qty 1

## 2015-04-21 MED ORDER — PROTAMINE SULFATE 10 MG/ML IV SOLN
INTRAVENOUS | Status: DC | PRN
  Administered 2015-04-21: 30 mg via INTRAVENOUS

## 2015-04-21 MED ORDER — BUPIVACAINE HCL (PF) 0.25 % IJ SOLN
INTRAMUSCULAR | Status: DC | PRN
  Administered 2015-04-21: 20 mL

## 2015-04-21 MED ORDER — MIDAZOLAM HCL 5 MG/ML IJ SOLN
INTRAMUSCULAR | Status: AC
  Filled 2015-04-21: qty 2

## 2015-04-21 MED ORDER — LIRAGLUTIDE 18 MG/3ML ~~LOC~~ SOPN: 12.0000 mL | PEN_INJECTOR | Freq: Every day | SUBCUTANEOUS | Status: DC

## 2015-04-21 MED ORDER — SODIUM CHLORIDE 0.9 % IV SOLN
INTRAVENOUS | Status: DC
  Administered 2015-04-21: 500 mL via INTRAVENOUS

## 2015-04-21 MED ORDER — HEPARIN SODIUM (PORCINE) 1000 UNIT/ML IJ SOLN
INTRAMUSCULAR | Status: AC
  Filled 2015-04-21: qty 1

## 2015-04-21 MED ORDER — WARFARIN - PHYSICIAN DOSING INPATIENT
Freq: Every day | Status: DC
  Administered 2015-04-21: 18:00:00

## 2015-04-21 MED ORDER — INFLUENZA VAC SPLIT QUAD 0.5 ML IM SUSY
0.5000 mL | PREFILLED_SYRINGE | INTRAMUSCULAR | Status: AC
  Administered 2015-04-22: 0.5 mL via INTRAMUSCULAR
  Filled 2015-04-21: qty 0.5

## 2015-04-21 MED ORDER — WARFARIN SODIUM 5 MG PO TABS
5.0000 mg | ORAL_TABLET | Freq: Once | ORAL | Status: AC
  Administered 2015-04-21: 5 mg via ORAL
  Filled 2015-04-21: qty 1

## 2015-04-21 SURGICAL SUPPLY — 21 items
BAG SNAP BAND KOVER 36X36 (MISCELLANEOUS) ×3 IMPLANT
BLANKET WARM UNDERBOD FULL ACC (MISCELLANEOUS) ×3 IMPLANT
CATH DIAG 6FR PIGTAIL (CATHETERS) ×3 IMPLANT
CATH NAVISTAR SMARTTOUCH DF (ABLATOR) ×3 IMPLANT
CATH SOUNDSTAR ECO REPROCESSED (CATHETERS) ×3 IMPLANT
CATH VARIABLE LASSO NAV 2515 (CATHETERS) ×3 IMPLANT
CATH WEBSTER BI DIR CS D-F CRV (CATHETERS) ×3 IMPLANT
COVER SWIFTLINK CONNECTOR (BAG) ×3 IMPLANT
NEEDLE TRANSEP BRK 71CM 407200 (NEEDLE) ×3 IMPLANT
PACK EP LATEX FREE (CUSTOM PROCEDURE TRAY) ×2
PACK EP LF (CUSTOM PROCEDURE TRAY) ×1 IMPLANT
PAD DEFIB LIFELINK (PAD) ×3 IMPLANT
PATCH CARTO3 (PAD) ×3 IMPLANT
SHEATH AVANTI 11F 11CM (SHEATH) ×3 IMPLANT
SHEATH PINNACLE 7F 10CM (SHEATH) ×6 IMPLANT
SHEATH PINNACLE 9F 10CM (SHEATH) ×3 IMPLANT
SHEATH SWARTZ TS SL2 63CM 8.5F (SHEATH) ×3 IMPLANT
SHIELD RADPAD SCOOP 12X17 (MISCELLANEOUS) ×3 IMPLANT
SYR MEDRAD MARK V 150ML (SYRINGE) ×3 IMPLANT
TUBING CONTRAST HIGH PRESS 48 (TUBING) ×3 IMPLANT
TUBING SMART ABLATE COOLFLOW (TUBING) ×3 IMPLANT

## 2015-04-21 NOTE — Interval H&P Note (Signed)
History and Physical Interval Note:  04/21/2015 8:34 AM  [name]  has presented today for surgery, with the diagnosis of A FIB  The various methods of treatment have been discussed with the patient and family. After consideration of risks, benefits and other options for treatment, the patient has consented to  Procedure(s): TRANSESOPHAGEAL ECHOCARDIOGRAM (TEE) (N/A) as a surgical intervention .  The patient's history has been reviewed, patient examined, no change in status, stable for surgery.  I have reviewed the patient's chart and labs.  Questions were answered to the patient's satisfaction.     [name]

## 2015-04-21 NOTE — Anesthesia Preprocedure Evaluation (Addendum)
Anesthesia Evaluation  Patient identified by MRN, date of birth, ID band Patient awake    History of Anesthesia Complications Negative for: history of anesthetic complications  Airway Mallampati: II  TM Distance: >3 FB Neck ROM: Full    Dental  (+) Edentulous Upper, Edentulous Lower   Pulmonary sleep apnea , COPD,  COPD inhaler, Current Smoker,    breath sounds clear to auscultation       Cardiovascular hypertension, Pt. on medications and Pt. on home beta blockers (-) angina+ dysrhythmias Atrial Fibrillation  Rhythm:Regular Rate:Normal  TEE today: EF 50%, mild MR, mild TR '08 Cath: normal coronaries   Neuro/Psych negative neurological ROS     GI/Hepatic Neg liver ROS, GERD  Medicated and Controlled,  Endo/Other  diabetes (glu 105), Oral Hypoglycemic Agents, Insulin Dependent  Renal/GU negative Renal ROS     Musculoskeletal  (+) Arthritis , Osteoarthritis,    Abdominal (+) + obese,   Peds  Hematology  (+) Blood dyscrasia (INR 2.94), ,   Anesthesia Other Findings   Reproductive/Obstetrics                            Anesthesia Physical Anesthesia Plan  ASA: III  Anesthesia Plan: General   Post-op Pain Management:    Induction: Intravenous  Airway Management Planned: LMA  Additional Equipment: Arterial line  Intra-op Plan:   Post-operative Plan:   Informed Consent: I have reviewed the patients History and Physical, chart, labs and discussed the procedure including the risks, benefits and alternatives for the proposed anesthesia with the patient or authorized representative who has indicated his/her understanding and acceptance.     Plan Discussed with: CRNA and Surgeon  Anesthesia Plan Comments: (Plan routine monitors, A line, GA- LMA OK)        Anesthesia Quick Evaluation

## 2015-04-21 NOTE — Anesthesia Procedure Notes (Signed)
Procedure Name: LMA Insertion Date/Time: 04/21/2015 10:00 AM Performed by: Scheryl Darter Pre-anesthesia Checklist: Patient identified, Emergency Drugs available, Suction available, Patient being monitored and Timeout performed Patient Re-evaluated:Patient Re-evaluated prior to inductionOxygen Delivery Method: Circle system utilized Preoxygenation: Pre-oxygenation with 100% oxygen Intubation Type: IV induction Ventilation: Mask ventilation without difficulty LMA: LMA inserted LMA Size: 4.0 Number of attempts: 1 Placement Confirmation: positive ETCO2 and breath sounds checked- equal and bilateral Tube secured with: Tape Dental Injury: Teeth and Oropharynx as per pre-operative assessment

## 2015-04-21 NOTE — Transfer of Care (Signed)
Immediate Anesthesia Transfer of Care Note  Patient: [name]  Procedure(s) Performed: Procedure(s): Atrial Fibrillation Ablation (N/A)  Patient Location: PACU and Cath Lab  Anesthesia Type:General  Level of Consciousness: awake, alert , oriented and sedated  Airway & Oxygen Therapy: Patient Spontanous Breathing and Patient connected to nasal cannula oxygen  Post-op Assessment: Report given to RN, Post -op Vital signs reviewed and stable and Patient moving all extremities  Post vital signs: Reviewed and stable  Last Vitals:  Filed Vitals:   04/21/15 0920 04/21/15 0928  BP: 169/93 163/101  Pulse: 77 77  Temp:    Resp: 15 17    Complications: No apparent anesthesia complications

## 2015-04-21 NOTE — Progress Notes (Signed)
Pt recovered in Endo and transported to Cath lab for ablation procedure. Richard, CRNA to transport.

## 2015-04-21 NOTE — Progress Notes (Signed)
Discussed with MD about the victoza and cbg. who claimed that pt is going home tom. and not to worry about it.

## 2015-04-21 NOTE — H&P (Signed)
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 male who presents today for afib ablation. He has recently noticed increasing frequency and duration of his atrial fibrillation. He reports symptoms of palpitations and fatigue. He reports compliance with medical therapy. Today, he denies symptoms of chest pain, shortness of breath, or lower extremity edema. He continues to smoke but is trying to cut back/ quit. The patient is otherwise without complaint today.   Past Medical History  Diagnosis Date  . HLD (hyperlipidemia)     takes Pravastatin daily  . Obesity   . Tobacco abuse   . Overdose 2009    unintentional Flacanide overdose  . HTN (hypertension)     takes Prinizide daily  . Dyspnea     LHC 11/08: EF 60%, normal coronary arteries; ETT-echo 2/11: normal at 71% PMHR  . Seasonal allergies     was on Claritin-stopped taking this pt states it wasn't helping  . Emphysema   . Short-term memory loss   . Arthritis     lower back  . Chronic back pain     DDD/stenosis  . GERD (gastroesophageal reflux disease)     takes Omeprazole bid  . Colon polyps     9 polyps removed 10/13/11  . Diabetes mellitus     takes Metformin and Glimepiride daily  . History of shingles   . Atrial flutter     s/p CTI ablation by Dr Rayann Heman  . Paroxysmal atrial fibrillation   . Pneumonia 3-4 yrs ago    none recent  . OSA (obstructive sleep apnea)     not always using cpap  . Brain aneurysm 2009    questionable. A follow up CTA in 2009 showed no evidence of  . Peripheral neuropathy   . Complication of anesthesia September 11, 2012    slow to awaken after ablation   Past Surgical History  Procedure Laterality Date  . Neck surgery  51yrs ago  . Right shoulder surgery  4-46yrs ago    cyst removed  . Left inguinal hernia repair       as a child  . Tee without cardioversion  05/05/2011    Procedure: TRANSESOPHAGEAL ECHOCARDIOGRAM (TEE); Surgeon: Lelon Perla, MD; Location: [facility] ENDOSCOPY; Service: Cardiovascular; Laterality: N/A;  . Cardioversion  05/05/2011    Procedure: CARDIOVERSION; Surgeon: Lelon Perla, MD; Location: [facility] ENDOSCOPY; Service: Cardiovascular; Laterality: N/A;  . Throat surgery  4-30yrs ago    "thought " it was cancer but came back not  . Carpal tunnel release  99/2000    bilateral  . Appendectomy  2-60yrs ago  . Knee surgery  6-24yrs ago    left  . Cardiac catheterization  2008    no significant CAD  . Tonsillectomy    . Hernia repair    . Anterior cervical decomp/discectomy fusion  01/04/2012    Procedure: ANTERIOR CERVICAL DECOMPRESSION/DISCECTOMY FUSION 1 LEVEL/HARDWARE REMOVAL; Surgeon: Ophelia Charter, MD; Location: [place] NEURO ORS; Service: Neurosurgery; Laterality: N/A; explore cervical fusion Cervical six - seven with removal of codman plate anterior cervical decompression with fusion interbody prothesis plating and bonegraft  . Cardioversion Bilateral 07/26/2012    Procedure: CARDIOVERSION; Surgeon: Minus Breeding, MD; Location: [place]; Service: Cardiovascular; Laterality: Bilateral;  . Atrial flutter ablation  09/11/2012    CTI ablation by Dr Rayann Heman  . Colonoscopy with propofol N/A 12/13/2012    Procedure: COLONOSCOPY WITH PROPOFOL; Surgeon: Milus Banister, MD; Location: [facility] ENDOSCOPY; Service: Endoscopy;  Laterality: N/A;  . Nasal septoplasty w/ turbinoplasty Bilateral 02/19/2014    Procedure: NASAL SEPTOPLASTY WITH BILATERAL TURBINATE REDUCTION; Surgeon: Jodi Marble, MD; Location: [place]; Service: ENT; Laterality: Bilateral;  . Atrial fibrillation ablation N/A 09/11/2012    PT DID NOT HAVE AN ATRIAL FIBRILLATION ABLATION IN 2014! ATRIAL FLUTTER ABLATION ONLY     Current Outpatient Prescriptions  Medication Sig Dispense Refill  . albuterol (PROVENTIL HFA;VENTOLIN HFA) 108 (90 BASE) MCG/ACT inhaler Inhale 2 puffs into the lungs every 6 (six) hours as needed for wheezing or shortness of breath.    . bisoprolol (ZEBETA) 10 MG tablet Take 10 mg by mouth daily.    . Cholecalciferol (VITAMIN D) 2000 UNITS CAPS Take 1 capsule by mouth daily.    [name] diltiazem (CARDIZEM CD) 120 MG 24 hr capsule Take 1 capsule (120 mg total) by mouth daily. 30 capsule 6  . furosemide (LASIX) 40 MG tablet Take 1 tablet (40 mg total) by mouth daily as needed for fluid. 30 tablet 2  . glimepiride (AMARYL) 1 MG tablet Take 1 mg by mouth daily before breakfast.     . HYDROcodone-acetaminophen (NORCO/VICODIN) 5-325 MG per tablet Take 1 tablet by mouth every 6 (six) hours as needed for pain.    . Liraglutide (VICTOZA) 18 MG/3ML SOPN Inject 12 mLs into the skin daily.     [name] lisinopril (PRINIVIL,ZESTRIL) 20 MG tablet Take 1 tablet (20 mg total) by mouth daily. 90 tablet 1  . metFORMIN (GLUCOPHAGE) 500 MG tablet Take 1,000 mg by mouth 2 (two) times daily with a meal.     . MULTAQ 400 MG tablet TAKE 1 TABLET BY MOUTH TWICE DAILY WITH A MEAL 60 tablet 11  . Multiple Vitamin (MULTIVITAMIN WITH MINERALS) TABS Take 1 tablet by mouth daily.    [name] omeprazole (PRILOSEC) 20 MG capsule Take 20 mg by mouth 2 (two) times daily.     [name] oxyCODONE-acetaminophen (PERCOCET/ROXICET) 5-325 MG per tablet Take 1-2 tablets by mouth every 4 (four) hours as needed for moderate pain. 30 tablet 0  . potassium chloride (KLOR-CON 10) 10 MEQ tablet Take 10 mEq by mouth daily.     . pravastatin (PRAVACHOL) 80 MG tablet TAKE 1 TABLET BY MOUTH AT BEDTIME 30 tablet 5  . warfarin (COUMADIN) 5 MG tablet Take 5-7.5 mg by mouth daily. 7.5 mg M W F, And 5 mg all other days    . diltiazem (CARDIZEM) 60 MG tablet Take 0.5 tablets (30 mg  total) by mouth 2 (two) times daily as needed (AFIB). (Patient not taking: Reported on 02/11/2015) 60 tablet 2   No current facility-administered medications for this visit.    Physical Exam: Filed Vitals:   04/21/15 0719  BP: 156/89  Pulse: 89  Resp: 15   GEN- The patient is overweight and chronically ill appearing, alert and oriented x 3 today.  Head- normocephalic, atraumatic Eyes- Sclera clear, conjunctiva pink Ears- hearing intact Oropharynx- clear Lungs- prolonged expiratory wheezing,  Heart- irregular rate and rhythm, no murmurs, rubs or gallops, PMI not laterally displaced GI- soft, NT, ND, + BS Extremities- no clubbing, cyanosis, or edema,    Echo reveals LA 26mm, preserved EF  Assessment and Plan:  1. Paroxysmal Atrial fibrillation S/p atrial flutter ablation in 2014. He now has afib. He has failed medical therapy with multaq.His AAD options are few. Therapeutic strategies for afib including medicine and ablation were discussed in detail with the patient today. Risk, benefits, and alternatives to EP study and radiofrequency  ablation for afib were also discussed in detail today. These risks include but are not limited to stroke, bleeding, vascular damage, tamponade, perforation, damage to the esophagus, lungs, and other structures, pulmonary vein stenosis, worsening renal function, and death. The patient understands these risk and wishes to proceed at this time. INR yesterday was 3.  Will repeat INR this am.  If INR >3, will need to cancel TEE and ablation and return at another time.  If INR is less than or equal to 3 then we will proceed as planned today.  2. OSA Stable No change required today  3. Tobacco Cessation strongly advised He is making plans to quit  Thompson Grayer MD, [facility] 04/21/2015 7:55 AM

## 2015-04-21 NOTE — CV Procedure (Addendum)
    PROCEDURE NOTE:  Procedure:  Transesophageal echocardiogram Operator:  Fransico Him, MD Indications:  Atrial flutter Complications: Fentanyl Q000111Q, Versed 3mg , Lopressor 5mg  IV  Results: Normal LV size and low normal LV function - EF estimated at 50%.   Normal RV size and function Normal RA Moderately dilated LA.  Normal windsock shaped LAA with no evidence of LA or LAA thrombus. Normal TV with mild TR Mild mitral annular calcification with minimal focal calcification of the mitral valve leaflets.  There is mild MR. 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 Normal interatrial septum with no evidence of shunt by colorflow dopper.  No evidence of interatrial shunt by agitated saline contrast injection. Mild to moderate calcification of the thoracic aorta. Normal thoracic and ascending aorta.  The patient tolerated the procedure well and was transferred back to their room in stable condition.  Signed: Fransico Him, MD [facility]

## 2015-04-21 NOTE — Anesthesia Postprocedure Evaluation (Signed)
Anesthesia Post Note  Patient: [name]  Procedure(s) Performed: Procedure(s) (LRB): Atrial Fibrillation Ablation (N/A)  Patient location during evaluation: Cath Lab Anesthesia Type: General Level of consciousness: awake and alert, oriented and patient cooperative Pain management: pain level controlled Vital Signs Assessment: post-procedure vital signs reviewed and stable Respiratory status: spontaneous breathing, nonlabored ventilation, respiratory function stable and patient connected to nasal cannula oxygen Cardiovascular status: stable Postop Assessment: no signs of nausea or vomiting Anesthetic complications: no    Last Vitals:  Filed Vitals:   04/21/15 1535 04/21/15 1550  BP: 128/76 136/71  Pulse: 70 68  Temp:    Resp: 17 14    Last Pain: There were no vitals filed for this visit.               Midge Minium

## 2015-04-21 NOTE — Progress Notes (Signed)
Site area: Right groin a 7, 9,11 venous sheath was removed    Site Prior to Removal:  Level 0  Pressure Applied For 15  MINUTES    Minutes Beginning at 1545p  Manual:   Yes.    Patient Status During Pull:  stable  Post Pull Groin Site:  Level 0  Post Pull Instructions Given:  Yes.    Post Pull Pulses Present:  Yes.    Dressing Applied:  Yes.    Comments:  VS remain stable during sheath pull.

## 2015-04-21 NOTE — Progress Notes (Signed)
Admission from the EP lab post a-fib ablation. Instructed to be on bed rest for 6 hours and not to bend right knee. To call for any sign of bleeding on the right groin.

## 2015-04-22 ENCOUNTER — Encounter (HOSPITAL_COMMUNITY): Payer: Self-pay | Admitting: Internal Medicine

## 2015-04-22 DIAGNOSIS — Z23 Encounter for immunization: Secondary | ICD-10-CM | POA: Diagnosis not present

## 2015-04-22 DIAGNOSIS — I4892 Unspecified atrial flutter: Secondary | ICD-10-CM | POA: Diagnosis not present

## 2015-04-22 DIAGNOSIS — I480 Paroxysmal atrial fibrillation: Secondary | ICD-10-CM

## 2015-04-22 DIAGNOSIS — G4733 Obstructive sleep apnea (adult) (pediatric): Secondary | ICD-10-CM | POA: Diagnosis not present

## 2015-04-22 LAB — BASIC METABOLIC PANEL
ANION GAP: 6 (ref 5–15)
BUN: 6 mg/dL (ref 6–20)
CO2: 29 mmol/L (ref 22–32)
Calcium: 8.4 mg/dL — ABNORMAL LOW (ref 8.9–10.3)
Chloride: 104 mmol/L (ref 101–111)
Creatinine, Ser: 0.82 mg/dL (ref 0.61–1.24)
GFR calc Af Amer: >60 mL/min (ref >60)
GFR calc non Af Amer: >60 mL/min (ref >60)
GLUCOSE: 136 mg/dL — AB (ref 65–99)
Potassium: 4.1 mmol/L (ref 3.5–5.1)
Sodium: 139 mmol/L (ref 135–145)

## 2015-04-22 LAB — PROTIME-INR
INR: 3.45 — AB (ref 0.00–1.49)
PROTHROMBIN TIME: 34 s — AB (ref 11.6–15.2)

## 2015-04-22 LAB — GLUCOSE, CAPILLARY: GLUCOSE-CAPILLARY: 71 mg/dL (ref 65–99)

## 2015-04-22 LAB — MRSA PCR SCREENING: MRSA by PCR: NEGATIVE

## 2015-04-22 MED ORDER — WARFARIN SODIUM 5 MG PO TABS: ORAL_TABLET | ORAL | Status: DC

## 2015-04-22 NOTE — Discharge Instructions (Signed)
No driving for 4 days. No lifting over 5 lbs for 1 week. No sexual activity for 1 week. You may return to work in 7 days. Keep procedure site clean & dry. If you notice increased pain, swelling, bleeding or pus, call/return!  You may shower, but no soaking baths/hot tubs/pools for 1 week.    Do not take coumadin 12/7 or 04/23/15.  On 04/24/15 resume previous coumadin dosing.

## 2015-04-22 NOTE — Discharge Summary (Signed)
ELECTROPHYSIOLOGY PROCEDURE DISCHARGE SUMMARY    Patient ID: SLADER VASCONEZ,  MRN: [id number], DOB/AGE: [birth year] 59 y.o.  Admit date: 04/21/2015 Discharge date: 04/22/2015  Primary Care Physician: [name], MD Primary Cardiologist: Dr. Percival Spanish Electrophysiologist: Thompson Grayer, MD  Primary Discharge Diagnosis:  1. Paroxysmal Afib 2. Aflutter  Secondary Discharge Diagnosis:  1. OSA 2. HLD, HTN 3. DM  Procedures This Admission:  1.  Electrophysiology study and radiofrequency catheter ablation on 04/21/15 by Dr Thompson Grayer.  This study demonstrated CONCLUSIONS: 1. Isthmus dependant right atrial flutter upon presentation.  2. Rotational Angiography reveals a moderate sized left atrium with four separate pulmonary veins without evidence of pulmonary vein stenosis. 3. Successful electrical isolation and anatomical encircling of all four pulmonary veins with radiofrequency current.  4. Cavo-tricuspid isthmus ablation was performed with complete bidirectional isthmus block achieved.  5. No inducible arrhythmias following ablation  6. No early apparent complications..    Brief HPI: [name] is a 59 y.o. male with a history of paroxysmal atrial fibrillation.  They have failed medical therapy with Multaq. Risks, benefits, and alternatives to catheter ablation of atrial fibrillation were reviewed with the patient who wished to proceed.  The patient underwent TEE prior to the procedure which demonstrated normal LV function and no LAA thrombus.    Hospital Course:  The patient was admitted and underwent EPS/RFCA of atrial fibrillation and flutter with details as outlined above.  They were monitored on telemetry overnight which demonstrated sinus rhythm.  Groin was without complication on the day of discharge.  The patient was examined by Dr.Eisa Necaise and considered to be stable for discharge.  Wound care and restrictions were reviewed with the patient.  The patient will be  seen back by Roderic Palau, NP in 4 weeks and Dr Rayann Heman in 12 weeks for post ablation follow up.   This patients CHA2DS2-VASc Score and unadjusted Ischemic Stroke Rate (% per year) is equal to 2.2 % stroke rate/year from a score of 2 Above score calculated as 1 point each if present [CHF, HTN, DM, Vascular=MI/PAD/Aortic Plaque, Age if 65-74, or Male] Above score calculated as 2 points each if present [Age > 75, or Stroke/TIA/TE]   Physical Exam: Filed Vitals:   04/22/15 0400 04/22/15 0500 04/22/15 0600 04/22/15 0700  BP:      Pulse:      Temp:      TempSrc:      Resp: 12 17 14 18   Height:      Weight:      SpO2:       GEN- The patient is well appearing, alert and oriented x 3 today.   HEENT: normocephalic, atraumatic; sclera clear, conjunctiva pink; hearing intact; aterally, normal work of breathing.  No wheezes, rales, rhonchi Heart- Regular rate and rhythm, no murmurs, rubs or gallops  GI- soft, non-tender, non-distended Extremities- no clubbing, cyanosis, or edema without hematoma/bruit MS- no significant deformity or atrophy Skin- warm and dry, no rash or lesion Psych- euthymic mood, full affect Neuro- strength and sensation are intact   Labs:   Lab Results  Component Value Date   WBC 6.4 04/20/2015   HGB 14.0 04/20/2015   HCT 41.7 04/20/2015   MCV 89.1 04/20/2015   PLT 164 04/20/2015     Recent Labs Lab 04/22/15 0421  NA 139  K 4.1  CL 104  CO2 29  BUN 6  CREATININE 0.82  CALCIUM 8.4*  GLUCOSE 136*     Discharge Medications:  Medication List    TAKE these medications        albuterol 108 (90 BASE) MCG/ACT inhaler  Commonly known as:  PROVENTIL HFA;VENTOLIN HFA  Inhale 2 puffs into the lungs every 6 (six) hours as needed for wheezing or shortness of breath.     bisoprolol 10 MG tablet  Commonly known as:  ZEBETA  Take 10 mg by mouth daily.     diltiazem 120 MG 24 hr capsule  Commonly known as:  CARDIZEM CD  Take 1 capsule (120 mg total)  by mouth daily.     diltiazem 60 MG tablet  Commonly known as:  CARDIZEM  Take 0.5 tablets (30 mg total) by mouth 2 (two) times daily as needed (AFIB).     furosemide 40 MG tablet  Commonly known as:  LASIX  Take 1 tablet (40 mg total) by mouth daily as needed for fluid.     glimepiride 1 MG tablet  Commonly known as:  AMARYL  Take 1 mg by mouth daily before breakfast.     HYDROcodone-acetaminophen 5-325 MG tablet  Commonly known as:  NORCO/VICODIN  Take 1 tablet by mouth every 6 (six) hours as needed for pain.     KLOR-CON 10 10 MEQ tablet  Generic drug:  potassium chloride  Take 10 mEq by mouth daily.     lisinopril 20 MG tablet  Commonly known as:  PRINIVIL,ZESTRIL  TAKE 1 TABLET BY MOUTH DAILY     metFORMIN 500 MG tablet  Commonly known as:  GLUCOPHAGE  Take 1,000 mg by mouth 2 (two) times daily with a meal.     MULTAQ 400 MG tablet  Generic drug:  dronedarone  TAKE 1 TABLET BY MOUTH TWICE DAILY WITH A MEAL     multivitamin with minerals Tabs tablet  Take 1 tablet by mouth daily.     omeprazole 20 MG capsule  Commonly known as:  PRILOSEC  Take 20 mg by mouth 2 (two) times daily.     oxyCODONE-acetaminophen 5-325 MG tablet  Commonly known as:  PERCOCET/ROXICET  Take 1-2 tablets by mouth every 4 (four) hours as needed for moderate pain.     pravastatin 80 MG tablet  Commonly known as:  PRAVACHOL  TAKE ONE (1) TABLET BY MOUTH AT BEDTIME     VICTOZA 18 MG/3ML Sopn  Generic drug:  Liraglutide  Inject 12 mLs into the skin daily.     Vitamin D 2000 UNITS Caps  Take 1 capsule by mouth daily.     warfarin 5 MG tablet  Commonly known as:  COUMADIN  Do not take coumadin 12/7 or 04/23/15.  Resume previous home dosing on 04/24/15.        Disposition:   Follow-up Information    Follow up with [facility] On 04/28/2015.   Specialty:  Cardiology   Why:  8:15 am for coumadin check   Contact information:   [address], [place] [address] [phone]      Follow up with [facility] On 05/19/2015.   Specialty:  Cardiology   Why:  9:30 am   Contact information:   [id number] [place] [phone]      Follow up with Thompson Grayer, MD On 07/20/2015.   Specialty:  Cardiology   Why:  9:30 am   Contact information:   [address] [place] [address] [phone]  Duration of Discharge Encounter: Greater than 30 minutes including physician time.  Army Fossa MD  04/22/2015 7:06 AM

## 2015-04-22 NOTE — Progress Notes (Signed)
MD Allred at bedside, d/c orders written. VS stable. Pt called for transportation. PIV removed. Influenza vaccine administered. Pt refused other meds. D/c instructions given, no questions or concerns from pt. Friend is here to transport.

## 2015-04-28 ENCOUNTER — Ambulatory Visit
Admission: RE | Admit: 2015-04-28 | Discharge: 2015-04-28 | Disposition: A | Discharge status: Home / Self Care | Payer: BLUE CROSS/BLUE SHIELD | Source: Ambulatory Visit | Attending: Nurse Practitioner | Admitting: Nurse Practitioner

## 2015-04-28 ENCOUNTER — Ambulatory Visit (HOSPITAL_COMMUNITY): Payer: BLUE CROSS/BLUE SHIELD | Attending: Cardiology

## 2015-04-28 ENCOUNTER — Ambulatory Visit (INDEPENDENT_AMBULATORY_CARE_PROVIDER_SITE_OTHER): Payer: BLUE CROSS/BLUE SHIELD | Admitting: *Deleted

## 2015-04-28 ENCOUNTER — Encounter: Payer: Self-pay | Admitting: Nurse Practitioner

## 2015-04-28 ENCOUNTER — Ambulatory Visit (INDEPENDENT_AMBULATORY_CARE_PROVIDER_SITE_OTHER): Payer: BLUE CROSS/BLUE SHIELD | Admitting: Nurse Practitioner

## 2015-04-28 ENCOUNTER — Other Ambulatory Visit: Payer: Self-pay | Admitting: Nurse Practitioner

## 2015-04-28 ENCOUNTER — Other Ambulatory Visit: Payer: Self-pay

## 2015-04-28 VITALS — BP 128/72 | HR 63 | Resp 20 | Ht 75.0 in | Wt 253.0 lb

## 2015-04-28 DIAGNOSIS — I313 Pericardial effusion (noninflammatory): Secondary | ICD-10-CM | POA: Insufficient documentation

## 2015-04-28 DIAGNOSIS — Z7901 Long term (current) use of anticoagulants: Secondary | ICD-10-CM | POA: Diagnosis not present

## 2015-04-28 DIAGNOSIS — E785 Hyperlipidemia, unspecified: Secondary | ICD-10-CM | POA: Diagnosis not present

## 2015-04-28 DIAGNOSIS — R0989 Other specified symptoms and signs involving the circulatory and respiratory systems: Secondary | ICD-10-CM

## 2015-04-28 DIAGNOSIS — I272 Other secondary pulmonary hypertension: Secondary | ICD-10-CM | POA: Insufficient documentation

## 2015-04-28 DIAGNOSIS — R079 Chest pain, unspecified: Secondary | ICD-10-CM | POA: Diagnosis present

## 2015-04-28 DIAGNOSIS — I10 Essential (primary) hypertension: Secondary | ICD-10-CM | POA: Diagnosis not present

## 2015-04-28 DIAGNOSIS — Z8679 Personal history of other diseases of the circulatory system: Secondary | ICD-10-CM | POA: Diagnosis not present

## 2015-04-28 DIAGNOSIS — I059 Rheumatic mitral valve disease, unspecified: Secondary | ICD-10-CM | POA: Insufficient documentation

## 2015-04-28 DIAGNOSIS — Z9889 Other specified postprocedural states: Secondary | ICD-10-CM

## 2015-04-28 DIAGNOSIS — R0789 Other chest pain: Secondary | ICD-10-CM | POA: Insufficient documentation

## 2015-04-28 DIAGNOSIS — Z5181 Encounter for therapeutic drug level monitoring: Secondary | ICD-10-CM | POA: Diagnosis not present

## 2015-04-28 DIAGNOSIS — I517 Cardiomegaly: Secondary | ICD-10-CM | POA: Diagnosis not present

## 2015-04-28 DIAGNOSIS — I4891 Unspecified atrial fibrillation: Secondary | ICD-10-CM | POA: Diagnosis not present

## 2015-04-28 DIAGNOSIS — I480 Paroxysmal atrial fibrillation: Secondary | ICD-10-CM | POA: Diagnosis not present

## 2015-04-28 DIAGNOSIS — R0600 Dyspnea, unspecified: Secondary | ICD-10-CM | POA: Diagnosis not present

## 2015-04-28 LAB — BASIC METABOLIC PANEL
BUN: 7 mg/dL (ref 7–25)
CO2: 28 mmol/L (ref 20–31)
Calcium: 9.6 mg/dL (ref 8.6–10.3)
Chloride: 102 mmol/L (ref 98–110)
Creat: 0.75 mg/dL (ref 0.70–1.33)
Glucose, Bld: 79 mg/dL (ref 65–99)
Potassium: 4 mmol/L (ref 3.5–5.3)
Sodium: 141 mmol/L (ref 135–146)

## 2015-04-28 LAB — CBC
HCT: 35.9 % — ABNORMAL LOW (ref 39.0–52.0)
Hemoglobin: 12 g/dL — ABNORMAL LOW (ref 13.0–17.0)
MCH: 29.9 pg (ref 26.0–34.0)
MCHC: 33.4 g/dL (ref 30.0–36.0)
MCV: 89.5 fL (ref 78.0–100.0)
MPV: 11.4 fL (ref 8.6–12.4)
Platelets: 161 10*3/uL (ref 150–400)
RBC: 4.01 MIL/uL — ABNORMAL LOW (ref 4.22–5.81)
RDW: 14.5 % (ref 11.5–15.5)
WBC: 6.8 10*3/uL (ref 4.0–10.5)

## 2015-04-28 LAB — POCT INR: INR: 1.7

## 2015-04-28 MED ORDER — FUROSEMIDE 40 MG PO TABS: 40.0000 mg | ORAL_TABLET | Freq: Every day | ORAL | Status: DC | PRN

## 2015-04-28 NOTE — Progress Notes (Signed)
CARDIOLOGY OFFICE NOTE  Date:  04/28/2015    [name] Date of Birth: 1955-07-11 Medical Record [id number]  PCP:  [name], MD  Cardiologist:  Allred    Chief Complaint  Patient presents with  . Chest Pain    Work in visit - seen for Dr. Rayann Heman  . Shortness of Breath    History of Present Illness: [name] is a 59 y.o. male who presents today for a work in visit. Seen for Dr. Rayann Heman.   He has a history of PAF/flutter - failed on Multaq and underwent ablation earlier this month. Other issues OSA, HLD, HTN and DM.  Was in the coumadin clinic earlier this morning - complaining of dyspnea and chest pain and weight changes - ordered stat echo - this is ok. Then added to my schedule for evaluation.   Comes in today. Here alone. He had his ablation one week ago - notes that he had "fluid build up" at discharge. Prior to the ablation says he got his weight down from 252 to 231??. Back up at 256 at discharge. Says he was 248 at home. For 3 nights he has been short of breath with lying down. Some "minor" chest pain - was told he might have this and this is really not bothersome for him. Rare "flutter" but rhythm has been good. No fever or chills. He continues to smoke.   Past Medical History  Diagnosis Date  . HLD (hyperlipidemia)     takes Pravastatin daily  . Obesity   . Tobacco abuse   . Overdose 2009    unintentional Flacanide overdose  . HTN (hypertension)     takes Prinizide daily  . Dyspnea     LHC 11/08: EF 60%, normal coronary arteries;  ETT-echo 2/11: normal at 71% PMHR  . Seasonal allergies     was on Claritin-stopped taking this pt states it wasn't helping  . Emphysema   . Short-term memory loss   . Arthritis     lower back  . Chronic back pain     DDD/stenosis  . GERD (gastroesophageal reflux disease)     takes Omeprazole bid  . Colon polyps     9 polyps removed 10/13/11  . Diabetes mellitus     takes Metformin and Glimepiride daily  .  History of shingles   . Atrial flutter ([place])     s/p CTI ablation by Dr Rayann Heman  . Paroxysmal atrial fibrillation (HCC)   . Pneumonia 3-4 yrs ago    none recent  . OSA (obstructive sleep apnea)     not always using cpap  . Brain aneurysm 2009    questionable. A follow up CTA in 2009 showed no evidence of  . Peripheral neuropathy ([place])   . Complication of anesthesia September 11, 2012    slow to awaken after ablation    Past Surgical History  Procedure Laterality Date  . Neck surgery  58yrs ago  . Right shoulder surgery  4-63yrs ago    cyst removed  . Left inguinal hernia repair      as a child  . Tee without cardioversion  05/05/2011    Procedure: TRANSESOPHAGEAL ECHOCARDIOGRAM (TEE);  Surgeon: Lelon Perla, MD;  Location: [facility] ENDOSCOPY;  Service: Cardiovascular;  Laterality: N/A;  . Cardioversion  05/05/2011    Procedure: CARDIOVERSION;  Surgeon: Lelon Perla, MD;  Location: [facility] ENDOSCOPY;  Service: Cardiovascular;  Laterality: N/A;  . Throat surgery  4-59yrs ago    "  thought " it was cancer but came back not  . Carpal tunnel release  99/2000    bilateral  . Appendectomy  2-29yrs ago  . Knee surgery  6-45yrs ago    left  . Cardiac catheterization  2008    no significant CAD  . Tonsillectomy    . Hernia repair    . Anterior cervical decomp/discectomy fusion  01/04/2012    Procedure: ANTERIOR CERVICAL DECOMPRESSION/DISCECTOMY FUSION 1 LEVEL/HARDWARE REMOVAL;  Surgeon: Ophelia Charter, MD;  Location: [place] NEURO ORS;  Service: Neurosurgery;  Laterality: N/A;  explore cervical fusion Cervical six - seven  with removal of codman plate anterior cervical decompression with fusion interbody prothesis plating and bonegraft  . Cardioversion Bilateral 07/26/2012    Procedure: CARDIOVERSION;  Surgeon: Minus Breeding, MD;  Location: [place];  Service: Cardiovascular;  Laterality: Bilateral;  . Atrial flutter ablation  09/11/2012    CTI ablation by Dr Rayann Heman  . Colonoscopy with propofol  N/A 12/13/2012    Procedure: COLONOSCOPY WITH PROPOFOL;  Surgeon: Milus Banister, MD;  Location: [facility] ENDOSCOPY;  Service: Endoscopy;  Laterality: N/A;  . Nasal septoplasty w/ turbinoplasty Bilateral 02/19/2014    Procedure: NASAL SEPTOPLASTY WITH BILATERAL TURBINATE REDUCTION;  Surgeon: Jodi Marble, MD;  Location: [place];  Service: ENT;  Laterality: Bilateral;  . Atrial fibrillation ablation N/A 09/11/2012    PT DID NOT HAVE AN ATRIAL FIBRILLATION ABLATION IN 2014!  ATRIAL FLUTTER ABLATION ONLY  . Electrophysiologic study N/A 04/21/2015    Procedure: Atrial Fibrillation Ablation;  Surgeon: Thompson Grayer, MD;  Location: [place] CV LAB;  Service: Cardiovascular;  Laterality: N/A;  . Tee without cardioversion N/A 04/21/2015    Procedure: TRANSESOPHAGEAL ECHOCARDIOGRAM (TEE);  Surgeon: Sueanne Margarita, MD;  Location: [facility] ENDOSCOPY;  Service: Cardiovascular;  Laterality: N/A;     Medications: Current Outpatient Prescriptions  Medication Sig Dispense Refill  . albuterol (PROVENTIL HFA;VENTOLIN HFA) 108 (90 BASE) MCG/ACT inhaler Inhale 2 puffs into the lungs every 6 (six) hours as needed for wheezing or shortness of breath.    . bisoprolol (ZEBETA) 10 MG tablet Take 10 mg by mouth daily.    . Cholecalciferol (VITAMIN D) 2000 UNITS CAPS Take 1 capsule by mouth daily.    [name] diltiazem (CARDIZEM CD) 120 MG 24 hr capsule Take 1 capsule (120 mg total) by mouth daily. 30 capsule 6  . diltiazem (CARDIZEM) 60 MG tablet Take 0.5 tablets (30 mg total) by mouth 2 (two) times daily as needed (AFIB). (Patient not taking: Reported on 02/11/2015) 60 tablet 2  . furosemide (LASIX) 40 MG tablet Take 1 tablet (40 mg total) by mouth daily as needed for fluid. 30 tablet 2  . glimepiride (AMARYL) 1 MG tablet Take 1 mg by mouth daily before breakfast.     . HYDROcodone-acetaminophen (NORCO/VICODIN) 5-325 MG per tablet Take 1 tablet by mouth every 6 (six) hours as needed for pain.    . Liraglutide (VICTOZA) 18 MG/3ML SOPN  Inject 12 mLs into the skin daily.     [name] lisinopril (PRINIVIL,ZESTRIL) 20 MG tablet TAKE 1 TABLET BY MOUTH DAILY 90 tablet 2  . metFORMIN (GLUCOPHAGE) 500 MG tablet Take 1,000 mg by mouth 2 (two) times daily with a meal.     . MULTAQ 400 MG tablet TAKE 1 TABLET BY MOUTH TWICE DAILY WITH A MEAL 60 tablet 11  . Multiple Vitamin (MULTIVITAMIN WITH MINERALS) TABS Take 1 tablet by mouth daily.    [name] omeprazole (PRILOSEC) 20 MG capsule Take  20 mg by mouth 2 (two) times daily.     [name] oxyCODONE-acetaminophen (PERCOCET/ROXICET) 5-325 MG per tablet Take 1-2 tablets by mouth every 4 (four) hours as needed for moderate pain. 30 tablet 0  . potassium chloride (KLOR-CON 10) 10 MEQ tablet Take 10 mEq by mouth daily.     . pravastatin (PRAVACHOL) 80 MG tablet TAKE ONE (1) TABLET BY MOUTH AT BEDTIME 30 tablet 0  . warfarin (COUMADIN) 5 MG tablet Do not take coumadin 12/7 or 04/23/15.  Resume previous home dosing on 04/24/15. 45 tablet 3   No current facility-administered medications for this visit.    Allergies: Allergies  Allergen Reactions  . Adhesive [Tape]     itching  . Latex Itching    When tape is on the skin too long skin gets red & itching    Social History: The patient  reports that he has been smoking Cigarettes.  He has a 12 pack-year smoking history. He has never used smokeless tobacco. He reports that he drinks alcohol. He reports that he does not use illicit drugs.   Family History: The patient's family history is negative for Colon cancer, Anesthesia problems, Hypotension, Malignant hyperthermia, and Pseudochol deficiency.   Review of Systems: Please see the history of present illness.   Otherwise, the review of systems is positive for chest pain/shortness of breath - recent weight gain.  Few flutters in the chest. No atrial fib.  All other systems are reviewed and negative.   Physical Exam: VS:  BP 128/72 mmHg  Pulse 63  Resp 20  Ht 6\' 3"  (1.905 m)  Wt 253 lb (114.76 kg)  BMI 31.62  kg/m2 .  BMI Body mass index is 31.62 kg/(m^2).  Wt Readings from Last 3 Encounters:  04/28/15 253 lb (114.76 kg)  04/21/15 253 lb 1.4 oz (114.8 kg)  02/11/15 255 lb 9.6 oz (115.939 kg)    General: Pleasant. He looks older than his stated age. He is alert and in no acute distress.  HEENT: Normal. Neck: Supple, no JVD, carotid bruits, or masses noted.  Cardiac: Regular rate and rhythm. Soft murmur. +1 edema.  Respiratory:  Lungs are fairly clear to auscultation bilaterally with normal work of breathing.  GI: Soft and nontender.  MS: No deformity or atrophy. Gait and ROM intact. Skin: Warm and dry. Color is normal.  Neuro:  Strength and sensation are intact and no gross focal deficits noted.  Psych: Alert, appropriate and with normal affect.   LABORATORY DATA:  EKG:  EKG is ordered today. This demonstrates NSR.  Lab Results  Component Value Date   WBC 6.4 04/20/2015   HGB 14.0 04/20/2015   HCT 41.7 04/20/2015   PLT 164 04/20/2015   GLUCOSE 136* 04/22/2015   CHOL 180 09/08/2011   TRIG 294.0* 09/08/2011   HDL 33.80* 09/08/2011   LDLDIRECT 99.9 09/08/2011   LDLCALC  02/13/2008    85        Total Cholesterol/HDL:CHD Risk Coronary Heart Disease Risk Table                     Men   Women  1/2 Average Risk   3.4   3.3   ALT 44 02/14/2014   AST 41* 02/14/2014   NA 139 04/22/2015   K 4.1 04/22/2015   CL 104 04/22/2015   CREATININE 0.82 04/22/2015   BUN 6 04/22/2015   CO2 29 04/22/2015   TSH 1.57 10/01/2013   INR 1.7 04/28/2015  HGBA1C  04/10/2007    6.0 (NOTE)   The ADA recommends the following therapeutic goals for glycemic   control related to Hgb A1C measurement:   Goal of Therapy:   < 7.0% Hgb A1C   Action Suggested:  > 8.0% Hgb A1C   Ref:  Diabetes Care, 22, Suppl. 1, 1999    Lab Results  Component Value Date   INR 1.7 04/28/2015   INR 3.45* 04/22/2015   INR 2.94* 04/21/2015     BNP (last 3 results) No results for input(s): BNP in the last 8760  hours.  ProBNP (last 3 results) No results for input(s): PROBNP in the last 8760 hours.   Other Studies Reviewed Today:  Echo from today pending - preliminary reading with NO effusion and normal EF.   Echo Study Conclusions from 04/21/2015  - Left ventricle: Systolic function was normal. The estimated ejection fraction was 50%. Wall motion was normal; there were no regional wall motion abnormalities. - Aortic valve: Mild diffuse thickening and calcification. - Aorta: Mild to moderate atherosclerosis of the thoracic aorta. - Mitral valve: Mildly calcified annulus. Minimal focal calcification. There was mild regurgitation. - Left atrium: The atrium was moderately dilated. No evidence of thrombus in the atrial cavity or appendage. - Right atrium: No evidence of thrombus in the atrial cavity or appendage.  Assessment/Plan: 1. Post AF/flutter ablation - remains in NSR by EKG today.   2. Post procedure DOE - especially with lying down - weight changes and does have swelling on exam - preliminary echo ok. Final report pending. Will have him use Lasix 60 mg for 3 days and then back to just prn.   3. HTN - BP ok on current regimen. Would continue with his current regimen.   4. Chronic anticoagulation - no problems noted  5. Tobacco abuse / probable COPD - seeing pulmonary in March. Encouraged to stop smoking. CXR today  Current medicines are reviewed with the patient today.  The patient does not have concerns regarding medicines other than what has been noted above.  The following changes have been made:  See above.  Labs/ tests ordered today include:    Orders Placed This Encounter  Procedures  . DG Chest 2 View  . Brain natriuretic peptide  . Basic metabolic panel  . CBC  . EKG 12-Lead     Disposition:   FU with Dr. Aggie Cosier, NP as planned.    Patient is agreeable to this plan and will call if any problems develop in the interim.   Signed: Burtis Junes, RN, ANP-C 04/28/2015 10:53 AM  [facility] [address] [place], [place]  [address] Phone: [phone] Fax: [phone]

## 2015-04-28 NOTE — Patient Instructions (Addendum)
We will be checking the following labs today - BMET, CBC and BNP  Please go to [facility] to [place] on the first floor for a chest Xray - you may walk in.    Medication Instructions:    Continue with your current medicines. BUT  I want you to take 1 1/2 tablets of your Lasix (60mg ) - do this for 3 days and then stop and go back to using "just as needed".     Testing/Procedures To Be Arranged:  N/A  Follow-Up:   See Dr. Aggie Cosier, NP as planned.     Other Special Instructions:   STOP smoking!    If you need a refill on your cardiac medications before your next appointment, please call your pharmacy.   Call the [place] office at [phone] if you have any questions, problems or concerns.

## 2015-04-29 LAB — BRAIN NATRIURETIC PEPTIDE: Brain Natriuretic Peptide: 143.6 pg/mL — ABNORMAL HIGH (ref 0.0–100.0)

## 2015-05-12 ENCOUNTER — Ambulatory Visit (INDEPENDENT_AMBULATORY_CARE_PROVIDER_SITE_OTHER): Payer: BLUE CROSS/BLUE SHIELD | Admitting: Pharmacist

## 2015-05-12 ENCOUNTER — Other Ambulatory Visit: Payer: Self-pay | Admitting: Cardiology

## 2015-05-12 DIAGNOSIS — Z5181 Encounter for therapeutic drug level monitoring: Secondary | ICD-10-CM | POA: Diagnosis not present

## 2015-05-12 DIAGNOSIS — Z7901 Long term (current) use of anticoagulants: Secondary | ICD-10-CM | POA: Diagnosis not present

## 2015-05-12 DIAGNOSIS — I4891 Unspecified atrial fibrillation: Secondary | ICD-10-CM | POA: Diagnosis not present

## 2015-05-12 DIAGNOSIS — I480 Paroxysmal atrial fibrillation: Secondary | ICD-10-CM

## 2015-05-12 LAB — POCT INR: INR: 2.3

## 2015-05-12 NOTE — Telephone Encounter (Signed)
E-SENT TO PHARMACY --REFILL X 11

## 2015-05-19 ENCOUNTER — Ambulatory Visit (HOSPITAL_COMMUNITY)
Admission: RE | Admit: 2015-05-19 | Discharge: 2015-05-19 | Disposition: A | Discharge status: Home / Self Care | Payer: BLUE CROSS/BLUE SHIELD | Source: Ambulatory Visit | Attending: Nurse Practitioner | Admitting: Nurse Practitioner

## 2015-05-19 ENCOUNTER — Encounter (HOSPITAL_COMMUNITY): Payer: Self-pay | Admitting: Nurse Practitioner

## 2015-05-19 VITALS — BP 138/74 | HR 68 | Ht 75.0 in | Wt 248.8 lb

## 2015-05-19 DIAGNOSIS — I480 Paroxysmal atrial fibrillation: Secondary | ICD-10-CM | POA: Diagnosis present

## 2015-05-19 NOTE — Progress Notes (Signed)
Patient ID: [name], male   DOB: 06/13/1955, 60 y.o.   MRN: [id number]     Primary Care Physician: [name], MD Referring Physician: Dr. Maude Leriche is a 60 y.o. male with a h/o symptomatic persistent afib that had an ablation 12/6. He reports that he has done very well and denies any dysphagia or rt groin swelling. He has not had any sustained afib since procedure and remains on bisoprolol, cardizem, multaq. He has noticed increase in energy and can be more exertional without associated shortness of breath. He is happy with results.  Today, he denies symptoms of palpitations, chest pain, shortness of breath, orthopnea, PND, lower extremity edema, dizziness, presyncope, syncope, or neurologic sequela. The patient is tolerating medications without difficulties and is otherwise without complaint today.   Past Medical History  Diagnosis Date  . HLD (hyperlipidemia)     takes Pravastatin daily  . Obesity   . Tobacco abuse   . Overdose 2009    unintentional Flacanide overdose  . HTN (hypertension)     takes Prinizide daily  . Dyspnea     LHC 11/08: EF 60%, normal coronary arteries;  ETT-echo 2/11: normal at 71% PMHR  . Seasonal allergies     was on Claritin-stopped taking this pt states it wasn't helping  . Emphysema   . Short-term memory loss   . Arthritis     lower back  . Chronic back pain     DDD/stenosis  . GERD (gastroesophageal reflux disease)     takes Omeprazole bid  . Colon polyps     9 polyps removed 10/13/11  . Diabetes mellitus     takes Metformin and Glimepiride daily  . History of shingles   . Atrial flutter ([place])     s/p CTI ablation by Dr Rayann Heman  . Paroxysmal atrial fibrillation (HCC)   . Pneumonia 3-4 yrs ago    none recent  . OSA (obstructive sleep apnea)     not always using cpap  . Brain aneurysm 2009    questionable. A follow up CTA in 2009 showed no evidence of  . Peripheral neuropathy ([place])   . Complication of anesthesia September 11, 2012    slow to awaken after ablation   Past Surgical History  Procedure Laterality Date  . Neck surgery  20yrs ago  . Right shoulder surgery  4-61yrs ago    cyst removed  . Left inguinal hernia repair      as a child  . Tee without cardioversion  05/05/2011    Procedure: TRANSESOPHAGEAL ECHOCARDIOGRAM (TEE);  Surgeon: Lelon Perla, MD;  Location: [facility] ENDOSCOPY;  Service: Cardiovascular;  Laterality: N/A;  . Cardioversion  05/05/2011    Procedure: CARDIOVERSION;  Surgeon: Lelon Perla, MD;  Location: [facility] ENDOSCOPY;  Service: Cardiovascular;  Laterality: N/A;  . Throat surgery  4-29yrs ago    "thought " it was cancer but came back not  . Carpal tunnel release  99/2000    bilateral  . Appendectomy  2-85yrs ago  . Knee surgery  6-70yrs ago    left  . Cardiac catheterization  2008    no significant CAD  . Tonsillectomy    . Hernia repair    . Anterior cervical decomp/discectomy fusion  01/04/2012    Procedure: ANTERIOR CERVICAL DECOMPRESSION/DISCECTOMY FUSION 1 LEVEL/HARDWARE REMOVAL;  Surgeon: Ophelia Charter, MD;  Location: [place] NEURO ORS;  Service: Neurosurgery;  Laterality: N/A;  explore cervical fusion Cervical six -  seven  with removal of codman plate anterior cervical decompression with fusion interbody prothesis plating and bonegraft  . Cardioversion Bilateral 07/26/2012    Procedure: CARDIOVERSION;  Surgeon: Minus Breeding, MD;  Location: [place];  Service: Cardiovascular;  Laterality: Bilateral;  . Atrial flutter ablation  09/11/2012    CTI ablation by Dr Rayann Heman  . Colonoscopy with propofol N/A 12/13/2012    Procedure: COLONOSCOPY WITH PROPOFOL;  Surgeon: Milus Banister, MD;  Location: [facility] ENDOSCOPY;  Service: Endoscopy;  Laterality: N/A;  . Nasal septoplasty w/ turbinoplasty Bilateral 02/19/2014    Procedure: NASAL SEPTOPLASTY WITH BILATERAL TURBINATE REDUCTION;  Surgeon: Jodi Marble, MD;  Location: [place];  Service: ENT;  Laterality: Bilateral;  . Atrial fibrillation  ablation N/A 09/11/2012    PT DID NOT HAVE AN ATRIAL FIBRILLATION ABLATION IN 2014!  ATRIAL FLUTTER ABLATION ONLY  . Electrophysiologic study N/A 04/21/2015    Procedure: Atrial Fibrillation Ablation;  Surgeon: Thompson Grayer, MD;  Location: [place] CV LAB;  Service: Cardiovascular;  Laterality: N/A;  . Tee without cardioversion N/A 04/21/2015    Procedure: TRANSESOPHAGEAL ECHOCARDIOGRAM (TEE);  Surgeon: Sueanne Margarita, MD;  Location: [facility] ENDOSCOPY;  Service: Cardiovascular;  Laterality: N/A;    Current Outpatient Prescriptions  Medication Sig Dispense Refill  . albuterol (PROVENTIL HFA;VENTOLIN HFA) 108 (90 BASE) MCG/ACT inhaler Inhale 2 puffs into the lungs every 6 (six) hours as needed for wheezing or shortness of breath.    [name] amoxicillin (AMOXIL) 500 MG capsule Take 500 mg by mouth 3 (three) times daily.    . bisoprolol (ZEBETA) 10 MG tablet Take 10 mg by mouth daily.    . Cholecalciferol (VITAMIN D) 2000 UNITS CAPS Take 1 capsule by mouth daily.    [name] diltiazem (CARDIZEM CD) 120 MG 24 hr capsule Take 1 capsule (120 mg total) by mouth daily. 30 capsule 6  . diltiazem (CARDIZEM) 60 MG tablet Take 0.5 tablets (30 mg total) by mouth 2 (two) times daily as needed (AFIB). 60 tablet 2  . furosemide (LASIX) 40 MG tablet Take 1 tablet (40 mg total) by mouth daily as needed for fluid. 30 tablet 2  . glimepiride (AMARYL) 1 MG tablet Take 1 mg by mouth daily before breakfast.     . HYDROcodone-acetaminophen (NORCO/VICODIN) 5-325 MG per tablet Take 1 tablet by mouth every 6 (six) hours as needed for pain.    [name] levocetirizine (XYZAL) 5 MG tablet Take 5 mg by mouth every evening.    . Liraglutide (VICTOZA) 18 MG/3ML SOPN Inject 12 mLs into the skin daily.     [name] lisinopril (PRINIVIL,ZESTRIL) 20 MG tablet TAKE 1 TABLET BY MOUTH DAILY 90 tablet 2  . metFORMIN (GLUCOPHAGE) 500 MG tablet Take 1,000 mg by mouth 2 (two) times daily with a meal.     . MULTAQ 400 MG tablet TAKE 1 TABLET BY MOUTH TWICE DAILY WITH A  MEAL 60 tablet 11  . Multiple Vitamin (MULTIVITAMIN WITH MINERALS) TABS Take 1 tablet by mouth daily.    [name] omeprazole (PRILOSEC) 20 MG capsule Take 20 mg by mouth 2 (two) times daily.     [name] oxyCODONE-acetaminophen (PERCOCET/ROXICET) 5-325 MG per tablet Take 1-2 tablets by mouth every 4 (four) hours as needed for moderate pain. 30 tablet 0  . potassium chloride (KLOR-CON 10) 10 MEQ tablet Take 10 mEq by mouth daily.     . pravastatin (PRAVACHOL) 80 MG tablet TAKE 1 TABLET BY MOUTH EVERY NIGHT AT BEDTIME 30 tablet 11  . warfarin (COUMADIN)  5 MG tablet Do not take coumadin 12/7 or 04/23/15.  Resume previous home dosing on 04/24/15. 45 tablet 3   No current facility-administered medications for this encounter.    Allergies  Allergen Reactions  . Adhesive [Tape]     itching  . Latex Itching    When tape is on the skin too long skin gets red & itching    Social History   Social History  . Marital Status: Married    Spouse Name: N/A  . Number of Children: 2  . Years of Education: N/A   Occupational History  . Mechanic     Social History Main Topics  . Smoking status: Current Every Day Smoker -- 0.30 packs/day for 40 years    Types: Cigarettes  . Smokeless tobacco: Never Used  . Alcohol Use: 0.0 oz/week    0 Standard drinks or equivalent per week     Comment: couple of times a month liquor, he tells me he typically drinks 2-4 drinks most days but hasnt drank anything in 6 weeks  . Drug Use: No  . Sexual Activity: Yes   Other Topics Concern  . Not on file   Social History Narrative   Daily caffeine(Mountain Dew)       Lives in [place] with spouse.   Unemployed due to chronic back/ leg pain          Family History  Problem Relation Age of Onset  . Colon cancer Neg Hx   . Anesthesia problems Neg Hx   . Hypotension Neg Hx   . Malignant hyperthermia Neg Hx   . Pseudochol deficiency Neg Hx     ROS- All systems are reviewed and negative except as per the HPI  above  Physical Exam: Filed Vitals:   05/19/15 0944  BP: 138/74  Pulse: 68  Height: 6\' 3"  (1.905 m)  Weight: 248 lb 12.8 oz (112.855 kg)    GEN- The patient is well appearing, alert and oriented x 3 today.   Head- normocephalic, atraumatic Eyes-  Sclera clear, conjunctiva pink Ears- hearing intact Oropharynx- clear Neck- supple, no JVP Lymph- no cervical lymphadenopathy Lungs- Clear to ausculation bilaterally, normal work of breathing Heart- Regular rate and rhythm, no murmurs, rubs or gallops, PMI not laterally displaced GI- soft, NT, ND, + BS Extremities- no clubbing, cyanosis, or edema MS- no significant deformity or atrophy Skin- no rash or lesion Psych- euthymic mood, full affect Neuro- strength and sensation are intact  EKG- Sinus rhythm with sinus arrhythmia, heart rate at 68 bpm, pr int 156 ms, qrs int 82 ms, qgtc 442 ms. Epic records reviewed.  Assessment and Plan: 1. Afib s/p ablation Doing well staying in SR Continue diltiazem, bisoprolol and multaq Continue warfarin, last INR therapeutic at 2.3  F/u with Dr. Rayann Heman 3/6 Afib clinic as needed  Butch Penny C. Breylen Agyeman, [facility] [address] [place], [place] [address] [phone]

## 2015-05-29 ENCOUNTER — Encounter: Payer: Self-pay | Admitting: Internal Medicine

## 2015-06-09 ENCOUNTER — Ambulatory Visit (INDEPENDENT_AMBULATORY_CARE_PROVIDER_SITE_OTHER): Payer: BLUE CROSS/BLUE SHIELD | Admitting: *Deleted

## 2015-06-09 DIAGNOSIS — I4891 Unspecified atrial fibrillation: Secondary | ICD-10-CM

## 2015-06-09 DIAGNOSIS — I480 Paroxysmal atrial fibrillation: Secondary | ICD-10-CM

## 2015-06-09 DIAGNOSIS — Z7901 Long term (current) use of anticoagulants: Secondary | ICD-10-CM

## 2015-06-09 DIAGNOSIS — Z5181 Encounter for therapeutic drug level monitoring: Secondary | ICD-10-CM | POA: Diagnosis not present

## 2015-06-09 LAB — POCT INR: INR: 1.8

## 2015-06-23 ENCOUNTER — Ambulatory Visit (INDEPENDENT_AMBULATORY_CARE_PROVIDER_SITE_OTHER): Payer: BLUE CROSS/BLUE SHIELD

## 2015-06-23 DIAGNOSIS — Z7901 Long term (current) use of anticoagulants: Secondary | ICD-10-CM | POA: Diagnosis not present

## 2015-06-23 DIAGNOSIS — I480 Paroxysmal atrial fibrillation: Secondary | ICD-10-CM | POA: Diagnosis not present

## 2015-06-23 DIAGNOSIS — Z5181 Encounter for therapeutic drug level monitoring: Secondary | ICD-10-CM | POA: Diagnosis not present

## 2015-06-23 DIAGNOSIS — I4891 Unspecified atrial fibrillation: Secondary | ICD-10-CM | POA: Diagnosis not present

## 2015-06-23 LAB — POCT INR: INR: 1.9

## 2015-07-03 ENCOUNTER — Telehealth: Payer: Self-pay | Admitting: Internal Medicine

## 2015-07-03 NOTE — Telephone Encounter (Signed)
New message   Patient calling    Pt C/O medication issue:  1. Name of Medication: Multaq 400 mg   2. How are you currently taking this medication (dosage and times per day)?  Twice a day - in am  / pm    3. Are you having a reaction (difficulty breathing--STAT)? No   4. What is your medication issue? Went to pharmacy yesterday - the [name] was helping him with his medication  $ 100.00 he does not have that kind of money.

## 2015-07-06 ENCOUNTER — Ambulatory Visit (HOSPITAL_COMMUNITY)
Admission: RE | Admit: 2015-07-06 | Discharge: 2015-07-06 | Disposition: A | Discharge status: Home / Self Care | Payer: BLUE CROSS/BLUE SHIELD | Source: Ambulatory Visit | Attending: Nurse Practitioner | Admitting: Nurse Practitioner

## 2015-07-06 ENCOUNTER — Encounter (HOSPITAL_COMMUNITY): Payer: Self-pay | Admitting: Nurse Practitioner

## 2015-07-06 VITALS — BP 138/78 | HR 78 | Ht 75.0 in | Wt 245.0 lb

## 2015-07-06 DIAGNOSIS — I480 Paroxysmal atrial fibrillation: Secondary | ICD-10-CM | POA: Diagnosis not present

## 2015-07-06 DIAGNOSIS — R413 Other amnesia: Secondary | ICD-10-CM | POA: Diagnosis not present

## 2015-07-06 DIAGNOSIS — Z91040 Latex allergy status: Secondary | ICD-10-CM | POA: Diagnosis not present

## 2015-07-06 DIAGNOSIS — Z7901 Long term (current) use of anticoagulants: Secondary | ICD-10-CM | POA: Diagnosis not present

## 2015-07-06 DIAGNOSIS — E785 Hyperlipidemia, unspecified: Secondary | ICD-10-CM | POA: Diagnosis not present

## 2015-07-06 DIAGNOSIS — I4891 Unspecified atrial fibrillation: Secondary | ICD-10-CM | POA: Diagnosis present

## 2015-07-06 DIAGNOSIS — F17210 Nicotine dependence, cigarettes, uncomplicated: Secondary | ICD-10-CM | POA: Diagnosis not present

## 2015-07-06 DIAGNOSIS — J439 Emphysema, unspecified: Secondary | ICD-10-CM | POA: Insufficient documentation

## 2015-07-06 DIAGNOSIS — Z79899 Other long term (current) drug therapy: Secondary | ICD-10-CM | POA: Insufficient documentation

## 2015-07-06 DIAGNOSIS — G4733 Obstructive sleep apnea (adult) (pediatric): Secondary | ICD-10-CM | POA: Insufficient documentation

## 2015-07-06 DIAGNOSIS — Z7984 Long term (current) use of oral hypoglycemic drugs: Secondary | ICD-10-CM | POA: Diagnosis not present

## 2015-07-06 DIAGNOSIS — K219 Gastro-esophageal reflux disease without esophagitis: Secondary | ICD-10-CM | POA: Insufficient documentation

## 2015-07-06 DIAGNOSIS — E1142 Type 2 diabetes mellitus with diabetic polyneuropathy: Secondary | ICD-10-CM | POA: Insufficient documentation

## 2015-07-06 DIAGNOSIS — I10 Essential (primary) hypertension: Secondary | ICD-10-CM | POA: Insufficient documentation

## 2015-07-06 MED ORDER — DRONEDARONE HCL 400 MG PO TABS: ORAL_TABLET | ORAL | Status: DC

## 2015-07-06 NOTE — Telephone Encounter (Signed)
Spoke with patient today. He has been rationing his Multaq since Friday 02/17, taking one daily. Now he states his heart is out of rhythym. Talking about switching back to Diltiazem due to cost. I spoke with Claiborne Billings who recommends he call the A-Fib clinic for an appt. We have also left him samples of Multaq at front desk, and he is aware of that. Gave him the phone no. For the AF clinic.

## 2015-07-06 NOTE — Telephone Encounter (Signed)
[name] aware and will follow up with paient

## 2015-07-06 NOTE — Progress Notes (Signed)
Patient ID: [name], male   DOB: June 01, 1955, 60 y.o.   MRN: [id number]     Primary Care Physician: [name], MD Referring Physician: Dr. Maude Leriche is a 60 y.o. male with a h/o afib ablation 12/6 being seen in the afib clinic today for PAF. He has been using his multaq once a day over the weekend because he couldn't afford.He had assistance last year til now. I do have a copay card he can use with $0 co pay. He states no afib at the time of ablation and it has ben increasing by a little at a time, but has become more consistent over the weekend. He continues on Cardizem and bisoprolol. He states that he has sleep apnea that he is not treating because he needs another mask. He has an appointment with Dr. Elsworth Soho 3/1 and can discuss further. He has had some surgery on his nose and does not believe he is snoring as much.Continues on warfarin with last INR 1.9 EKG shows SR with PAC's but on ascultation was regular.  Today, he denies symptoms of palpitations, chest pain, shortness of breath, orthopnea, PND, lower extremity edema, dizziness, presyncope, syncope, or neurologic sequela. The patient is tolerating medications without difficulties and is otherwise without complaint today.   Past Medical History  Diagnosis Date  . HLD (hyperlipidemia)     takes Pravastatin daily  . Obesity   . Tobacco abuse   . Overdose 2009    unintentional Flacanide overdose  . HTN (hypertension)     takes Prinizide daily  . Dyspnea     LHC 11/08: EF 60%, normal coronary arteries;  ETT-echo 2/11: normal at 71% PMHR  . Seasonal allergies     was on Claritin-stopped taking this pt states it wasn't helping  . Emphysema   . Short-term memory loss   . Arthritis     lower back  . Chronic back pain     DDD/stenosis  . GERD (gastroesophageal reflux disease)     takes Omeprazole bid  . Colon polyps     9 polyps removed 10/13/11  . Diabetes mellitus     takes Metformin and Glimepiride daily  .  History of shingles   . Atrial flutter ([place])     s/p CTI ablation by Dr Rayann Heman  . Paroxysmal atrial fibrillation (H[facility])   . Pneumonia 3-4 yrs ago    none recent  . OSA (obstructive sleep apnea)     not always using cpap  . Brain aneurysm 2009    questionable. A follow up CTA in 2009 showed no evidence of  . Peripheral neuropathy ([place])   . Complication of anesthesia September 11, 2012    slow to awaken after ablation   Past Surgical History  Procedure Laterality Date  . Neck surgery  55yrs ago  . Right shoulder surgery  4-50yrs ago    cyst removed  . Left inguinal hernia repair      as a child  . Tee without cardioversion  05/05/2011    Procedure: TRANSESOPHAGEAL ECHOCARDIOGRAM (TEE);  Surgeon: Lelon Perla, MD;  Location: [facility] ENDOSCOPY;  Service: Cardiovascular;  Laterality: N/A;  . Cardioversion  05/05/2011    Procedure: CARDIOVERSION;  Surgeon: Lelon Perla, MD;  Location: [facility] ENDOSCOPY;  Service: Cardiovascular;  Laterality: N/A;  . Throat surgery  4-33yrs ago    "thought " it was cancer but came back not  . Carpal tunnel release  99/2000  bilateral  . Appendectomy  2-6yrs ago  . Knee surgery  6-37yrs ago    left  . Cardiac catheterization  2008    no significant CAD  . Tonsillectomy    . Hernia repair    . Anterior cervical decomp/discectomy fusion  01/04/2012    Procedure: ANTERIOR CERVICAL DECOMPRESSION/DISCECTOMY FUSION 1 LEVEL/HARDWARE REMOVAL;  Surgeon: Ophelia Charter, MD;  Location: [place] NEURO ORS;  Service: Neurosurgery;  Laterality: N/A;  explore cervical fusion Cervical six - seven  with removal of codman plate anterior cervical decompression with fusion interbody prothesis plating and bonegraft  . Cardioversion Bilateral 07/26/2012    Procedure: CARDIOVERSION;  Surgeon: Minus Breeding, MD;  Location: [place];  Service: Cardiovascular;  Laterality: Bilateral;  . Atrial flutter ablation  09/11/2012    CTI ablation by Dr Rayann Heman  . Colonoscopy with propofol N/A  12/13/2012    Procedure: COLONOSCOPY WITH PROPOFOL;  Surgeon: Milus Banister, MD;  Location: [facility] ENDOSCOPY;  Service: Endoscopy;  Laterality: N/A;  . Nasal septoplasty w/ turbinoplasty Bilateral 02/19/2014    Procedure: NASAL SEPTOPLASTY WITH BILATERAL TURBINATE REDUCTION;  Surgeon: Jodi Marble, MD;  Location: [place];  Service: ENT;  Laterality: Bilateral;  . Atrial fibrillation ablation N/A 09/11/2012    PT DID NOT HAVE AN ATRIAL FIBRILLATION ABLATION IN 2014!  ATRIAL FLUTTER ABLATION ONLY  . Electrophysiologic study N/A 04/21/2015    Procedure: Atrial Fibrillation Ablation;  Surgeon: Thompson Grayer, MD;  Location: [place] CV LAB;  Service: Cardiovascular;  Laterality: N/A;  . Tee without cardioversion N/A 04/21/2015    Procedure: TRANSESOPHAGEAL ECHOCARDIOGRAM (TEE);  Surgeon: Sueanne Margarita, MD;  Location: [facility] ENDOSCOPY;  Service: Cardiovascular;  Laterality: N/A;    Current Outpatient Prescriptions  Medication Sig Dispense Refill  . albuterol (PROVENTIL HFA;VENTOLIN HFA) 108 (90 BASE) MCG/ACT inhaler Inhale 2 puffs into the lungs every 6 (six) hours as needed for wheezing or shortness of breath.    [name] amoxicillin (AMOXIL) 500 MG capsule Take 500 mg by mouth 3 (three) times daily.    . bisoprolol (ZEBETA) 10 MG tablet Take 10 mg by mouth daily.    . Cholecalciferol (VITAMIN D) 2000 UNITS CAPS Take 1 capsule by mouth daily.    [name] diltiazem (CARDIZEM CD) 120 MG 24 hr capsule Take 1 capsule (120 mg total) by mouth daily. 30 capsule 6  . diltiazem (CARDIZEM) 60 MG tablet Take 0.5 tablets (30 mg total) by mouth 2 (two) times daily as needed (AFIB). 60 tablet 2  . dronedarone (MULTAQ) 400 MG tablet TAKE 1 TABLET BY MOUTH TWICE DAILY WITH A MEAL 60 tablet 11  . furosemide (LASIX) 40 MG tablet Take 1 tablet (40 mg total) by mouth daily as needed for fluid. 30 tablet 2  . glimepiride (AMARYL) 1 MG tablet Take 1 mg by mouth daily before breakfast.     . HYDROcodone-acetaminophen (NORCO/VICODIN) 5-325 MG  per tablet Take 1 tablet by mouth every 6 (six) hours as needed for pain.    [name] levocetirizine (XYZAL) 5 MG tablet Take 5 mg by mouth every evening.    . Liraglutide (VICTOZA) 18 MG/3ML SOPN Inject 12 mLs into the skin daily.     [name] lisinopril (PRINIVIL,ZESTRIL) 20 MG tablet TAKE 1 TABLET BY MOUTH DAILY 90 tablet 2  . metFORMIN (GLUCOPHAGE) 500 MG tablet Take 1,000 mg by mouth 2 (two) times daily with a meal.     . Multiple Vitamin (MULTIVITAMIN WITH MINERALS) TABS Take 1 tablet by mouth daily.    [name]  omeprazole (PRILOSEC) 20 MG capsule Take 20 mg by mouth 2 (two) times daily.     [name] oxyCODONE-acetaminophen (PERCOCET/ROXICET) 5-325 MG per tablet Take 1-2 tablets by mouth every 4 (four) hours as needed for moderate pain. 30 tablet 0  . potassium chloride (KLOR-CON 10) 10 MEQ tablet Take 10 mEq by mouth daily.     . pravastatin (PRAVACHOL) 80 MG tablet TAKE 1 TABLET BY MOUTH EVERY NIGHT AT BEDTIME 30 tablet 11  . warfarin (COUMADIN) 5 MG tablet Do not take coumadin 12/7 or 04/23/15.  Resume previous home dosing on 04/24/15. 45 tablet 3   No current facility-administered medications for this encounter.    Allergies  Allergen Reactions  . Adhesive [Tape]     itching  . Latex Itching    When tape is on the skin too long skin gets red & itching    Social History   Social History  . Marital Status: Married    Spouse Name: N/A  . Number of Children: 2  . Years of Education: N/A   Occupational History  . Mechanic     Social History Main Topics  . Smoking status: Current Every Day Smoker -- 0.30 packs/day for 40 years    Types: Cigarettes  . Smokeless tobacco: Never Used  . Alcohol Use: 0.0 oz/week    0 Standard drinks or equivalent per week     Comment: couple of times a month liquor, he tells me he typically drinks 2-4 drinks most days but hasnt drank anything in 6 weeks  . Drug Use: No  . Sexual Activity: Yes   Other Topics Concern  . Not on file   Social History Narrative   Daily  caffeine(Mountain Dew)       Lives in [place] with spouse.   Unemployed due to chronic back/ leg pain          Family History  Problem Relation Age of Onset  . Colon cancer Neg Hx   . Anesthesia problems Neg Hx   . Hypotension Neg Hx   . Malignant hyperthermia Neg Hx   . Pseudochol deficiency Neg Hx     ROS- All systems are reviewed and negative except as per the HPI above  Physical Exam: Filed Vitals:   07/06/15 1414  BP: 138/78  Pulse: 78  Height: 6\' 3"  (1.905 m)  Weight: 245 lb (111.131 kg)    GEN- The patient is well appearing, alert and oriented x 3 today.   Head- normocephalic, atraumatic Eyes-  Sclera clear, conjunctiva pink Ears- hearing intact Oropharynx- clear Neck- supple, no JVP Lymph- no cervical lymphadenopathy Lungs- Clear to ausculation bilaterally, normal work of breathing Heart- irregular( due to PAC's ) rate and rhythm, no murmurs, rubs or gallops, PMI not laterally displaced GI- soft, NT, ND, + BS Extremities- no clubbing, cyanosis, or edema MS- no significant deformity or atrophy Skin- no rash or lesion Psych- euthymic mood, full affect Neuro- strength and sensation are intact  EKG-SR with PAC's with pr 200 ms, Qrs int 80 ms, QTc 458 ms Epic records reviewed  Assessment and Plan: 1. Afib S/p ablation 12/16 In SR with pac's today On lower dose of multaq over weekend due to cost of drug RX for Multaq 400mg  bid was given with 0$ copay card Continue warfarin, bisoprolol, warfarin  2. Sleep apnea Currently not using cpap, due to issue with mask Ordering MD Dr. Elsworth Soho and will discuss with him 3/1 on f/u Informed that sleep apnea untreated could  undermine efforts to restore SR  F/u in 2 weeks, sooner if needed  Butch Penny C. Carroll, [facility] [address] [place], [place] [address] [phone]

## 2015-07-06 NOTE — Telephone Encounter (Signed)
Pt said he called Friday,still waiting to hear something.

## 2015-07-14 ENCOUNTER — Ambulatory Visit (INDEPENDENT_AMBULATORY_CARE_PROVIDER_SITE_OTHER): Payer: BLUE CROSS/BLUE SHIELD

## 2015-07-14 DIAGNOSIS — I480 Paroxysmal atrial fibrillation: Secondary | ICD-10-CM | POA: Diagnosis not present

## 2015-07-14 DIAGNOSIS — Z7901 Long term (current) use of anticoagulants: Secondary | ICD-10-CM

## 2015-07-14 DIAGNOSIS — I4891 Unspecified atrial fibrillation: Secondary | ICD-10-CM | POA: Diagnosis not present

## 2015-07-14 DIAGNOSIS — Z5181 Encounter for therapeutic drug level monitoring: Secondary | ICD-10-CM

## 2015-07-14 LAB — POCT INR: INR: 3.1

## 2015-07-15 ENCOUNTER — Encounter: Payer: Self-pay | Admitting: Pulmonary Disease

## 2015-07-15 ENCOUNTER — Ambulatory Visit (INDEPENDENT_AMBULATORY_CARE_PROVIDER_SITE_OTHER): Payer: BLUE CROSS/BLUE SHIELD | Admitting: Pulmonary Disease

## 2015-07-15 VITALS — BP 144/78 | HR 77 | Ht 75.0 in | Wt 249.2 lb

## 2015-07-15 DIAGNOSIS — J342 Deviated nasal septum: Secondary | ICD-10-CM

## 2015-07-15 DIAGNOSIS — J449 Chronic obstructive pulmonary disease, unspecified: Secondary | ICD-10-CM | POA: Diagnosis not present

## 2015-07-15 DIAGNOSIS — G4733 Obstructive sleep apnea (adult) (pediatric): Secondary | ICD-10-CM

## 2015-07-15 MED ORDER — IPRATROPIUM BROMIDE 0.03 % NA SOLN: 2.0000 | Freq: Two times a day (BID) | NASAL | Status: DC

## 2015-07-15 NOTE — Assessment & Plan Note (Signed)
For vasomotor rhinitis, trial of  Atrovent nasal spray - each nare at bedtime

## 2015-07-15 NOTE — Assessment & Plan Note (Signed)
Get back on CPAP - we will provide Rx for nasal pillows  & check download

## 2015-07-15 NOTE — Patient Instructions (Signed)
Atrovent nasal spray - each nare at bedtime Get back on CPAP - we will provide Rx for nasal pillows  SPirometry - pre/post in 2 months

## 2015-07-15 NOTE — Assessment & Plan Note (Signed)
SPirometry - pre/post in 2 months (once chest pain resolved) Lung function seems to be dropping - if worse, will need to start LABA/LAMA combination

## 2015-07-15 NOTE — Progress Notes (Signed)
   Subjective:    Patient ID: [name], male    DOB: [birth year], 60 y.o.   MRN: [id number]  HPI  PCP - [name]   59/M, smoker with atrial fibrillation for FU of COPD & sleep apnea.  He was treated with flecainide in the past, now on multaq & coumadin. He had TEE/DCCV in the past , ablation 04/2015.  He is a Estate manager/land agent & now disabled.  He reports sinus drainage for many years   07/15/2015  Chief Complaint  Patient presents with  . Follow-up    coughing so hard he hurt his right side of chest; non-productive.  CAT Score: 9; not wearing CPAP; doing fine without CPAP.  Pt says that since he had surgery on his nose 1 year ago, he has not been snoring and does not wake up gasping for air.    60m FU Underwent ablation for AF Smokes 1-2 cigs/ d C/o chronic leg pain - circulation vs back related, eases with walking Was not able to tolerate FF mask in the past  C/o cough - rt scapula is hurting - no wheeze or excessive albuterol use  C/o nasal clear discharge all year round No ER or hospitalizations.   CXR 04/2015 clear  Significant tests/ events  Spirometry 01/2010 showed ratio of 68, FEV 1 was 72%, smaller airways 55%, no significant BD response   PSG from [facility] - severe with AHI 31.h corrected by CPAP 5 cm (wt was 250 lbs)  11/2011 Underwent neck sx >> given pre-op prednisone for asthmatic bronchitis -uneventful post op    Spirometry fev1 68%  09/2012 FEV1 67%, no BD response  Spirometry 2015 with FEV1 59%, ratio 61      Review of Systems Patient denies significant dyspnea, hemoptysis,  chest pain, palpitations, pedal edema, orthopnea, paroxysmal nocturnal dyspnea, lightheadedness, nausea, vomiting, abdominal or  leg pains      Objective:   Physical Exam   Gen. Pleasant, well-nourished, in no distress ENT - no lesions, no post nasal drip Neck: No JVD, no thyromegaly, no carotid bruits Lungs: no use of accessory muscles, no dullness to percussion,  decreased without rales or rhonchi  Cardiovascular: Rhythm regular, heart sounds  normal, no murmurs or gallops, no peripheral edema Musculoskeletal: No deformities, no cyanosis or clubbing        Assessment & Plan:

## 2015-07-20 ENCOUNTER — Ambulatory Visit: Payer: BLUE CROSS/BLUE SHIELD | Admitting: Internal Medicine

## 2015-07-21 ENCOUNTER — Inpatient Hospital Stay (HOSPITAL_COMMUNITY)
Admission: RE | Admit: 2015-07-21 | Payer: BLUE CROSS/BLUE SHIELD | Source: Ambulatory Visit | Admitting: Nurse Practitioner

## 2015-07-29 ENCOUNTER — Telehealth: Payer: Self-pay | Admitting: Pulmonary Disease

## 2015-07-29 NOTE — Telephone Encounter (Signed)
Called and spoke with Jeani Hawking at [facility]. She states that she needs the pt's last ov faxed to [phone] in order to process the order for nasal pillows. I explained to her that I would fax ov notes today. She voiced understanding and had no further questions. OV notes faxed to number given. Nothing further needed.

## 2015-08-03 ENCOUNTER — Ambulatory Visit (INDEPENDENT_AMBULATORY_CARE_PROVIDER_SITE_OTHER): Payer: BLUE CROSS/BLUE SHIELD | Admitting: Internal Medicine

## 2015-08-03 ENCOUNTER — Encounter: Payer: Self-pay | Admitting: Internal Medicine

## 2015-08-03 ENCOUNTER — Ambulatory Visit (INDEPENDENT_AMBULATORY_CARE_PROVIDER_SITE_OTHER): Payer: BLUE CROSS/BLUE SHIELD | Admitting: *Deleted

## 2015-08-03 VITALS — BP 158/86 | HR 66 | Ht 75.0 in | Wt 248.0 lb

## 2015-08-03 DIAGNOSIS — G4733 Obstructive sleep apnea (adult) (pediatric): Secondary | ICD-10-CM | POA: Diagnosis not present

## 2015-08-03 DIAGNOSIS — I4891 Unspecified atrial fibrillation: Secondary | ICD-10-CM | POA: Diagnosis not present

## 2015-08-03 DIAGNOSIS — Z5181 Encounter for therapeutic drug level monitoring: Secondary | ICD-10-CM

## 2015-08-03 DIAGNOSIS — I480 Paroxysmal atrial fibrillation: Secondary | ICD-10-CM

## 2015-08-03 DIAGNOSIS — I10 Essential (primary) hypertension: Secondary | ICD-10-CM

## 2015-08-03 DIAGNOSIS — Z7901 Long term (current) use of anticoagulants: Secondary | ICD-10-CM

## 2015-08-03 LAB — BASIC METABOLIC PANEL
BUN: 10 mg/dL (ref 7–25)
CHLORIDE: 104 mmol/L (ref 98–110)
CO2: 24 mmol/L (ref 20–31)
CREATININE: 0.68 mg/dL — AB (ref 0.70–1.33)
Calcium: 9.2 mg/dL (ref 8.6–10.3)
Glucose, Bld: 134 mg/dL — ABNORMAL HIGH (ref 65–99)
POTASSIUM: 4 mmol/L (ref 3.5–5.3)
Sodium: 140 mmol/L (ref 135–146)

## 2015-08-03 LAB — POCT INR: INR: 2.7

## 2015-08-03 MED ORDER — LISINOPRIL 40 MG PO TABS: 40.0000 mg | ORAL_TABLET | Freq: Every day | ORAL | Status: DC

## 2015-08-03 NOTE — Patient Instructions (Signed)
Medication Instructions:  Your physician has recommended you make the following change in your medication:  1) Increase Lisinopril to 40 mg daily   Labwork: Your physician recommends that you return for lab work today: BMP   Testing/Procedures: None ordered   Follow-Up: Your physician recommends that you schedule a follow-up appointment in: 3 months with Roderic Palau, NP and 6 months with Dr Percival Spanish  Your physician recommends that you schedule a follow-up appointment in: 4 weeks with your PCP for Blood pressure check and BMP    Any Other Special Instructions Will Be Listed Below (If Applicable).  Smoking Cessation, Tips for Success If you are ready to quit smoking, congratulations! You have chosen to help yourself be healthier. Cigarettes bring nicotine, tar, carbon monoxide, and other irritants into your body. Your lungs, heart, and blood vessels will be able to work better without these poisons. There are many different ways to quit smoking. Nicotine gum, nicotine patches, a nicotine inhaler, or nicotine nasal spray can help with physical craving. Hypnosis, support groups, and medicines help break the habit of smoking. WHAT THINGS CAN I DO TO MAKE QUITTING EASIER?  Here are some tips to help you quit for good:  Pick a date when you will quit smoking completely. Tell all of your friends and family about your plan to quit on that date.  Do not try to slowly cut down on the number of cigarettes you are smoking. Pick a quit date and quit smoking completely starting on that day.  Throw away all cigarettes.   Clean and remove all ashtrays from your home, work, and car.  On a card, write down your reasons for quitting. Carry the card with you and read it when you get the urge to smoke.  Cleanse your body of nicotine. Drink enough water and fluids to keep your urine clear or pale yellow. Do this after quitting to flush the nicotine from your body.  Learn to predict your moods. Do  not let a bad situation be your excuse to have a cigarette. Some situations in your life might tempt you into wanting a cigarette.  Never have "just one" cigarette. It leads to wanting another and another. Remind yourself of your decision to quit.  Change habits associated with smoking. If you smoked while driving or when feeling stressed, try other activities to replace smoking. Stand up when drinking your coffee. Brush your teeth after eating. Sit in a different chair when you read the paper. Avoid alcohol while trying to quit, and try to drink fewer caffeinated beverages. Alcohol and caffeine may urge you to smoke.  Avoid foods and drinks that can trigger a desire to smoke, such as sugary or spicy foods and alcohol.  Ask people who smoke not to smoke around you.  Have something planned to do right after eating or having a cup of coffee. For example, plan to take a walk or exercise.  Try a relaxation exercise to calm you down and decrease your stress. Remember, you may be tense and nervous for the first 2 weeks after you quit, but this will pass.  Find new activities to keep your hands busy. Play with a pen, coin, or rubber band. Doodle or draw things on paper.  Brush your teeth right after eating. This will help cut down on the craving for the taste of tobacco after meals. You can also try mouthwash.   Use oral substitutes in place of cigarettes. Try using lemon drops, carrots, cinnamon sticks, or  chewing gum. Keep them handy so they are available when you have the urge to smoke.  When you have the urge to smoke, try deep breathing.  Designate your home as a nonsmoking area.  If you are a heavy smoker, ask your health care provider about a prescription for nicotine chewing gum. It can ease your withdrawal from nicotine.  Reward yourself. Set aside the cigarette money you save and buy yourself something nice.  Look for support from others. Join a support group or smoking cessation  program. Ask someone at home or at work to help you with your plan to quit smoking.  Always ask yourself, "Do I need this cigarette or is this just a reflex?" Tell yourself, "Today, I choose not to smoke," or "I do not want to smoke." You are reminding yourself of your decision to quit.  Do not replace cigarette smoking with electronic cigarettes (commonly called e-cigarettes). The safety of e-cigarettes is unknown, and some may contain harmful chemicals.  If you relapse, do not give up! Plan ahead and think about what you will do the next time you get the urge to smoke. HOW WILL I FEEL WHEN I QUIT SMOKING? You may have symptoms of withdrawal because your body is used to nicotine (the addictive substance in cigarettes). You may crave cigarettes, be irritable, feel very hungry, cough often, get headaches, or have difficulty concentrating. The withdrawal symptoms are only temporary. They are strongest when you first quit but will go away within 10-14 days. When withdrawal symptoms occur, stay in control. Think about your reasons for quitting. Remind yourself that these are signs that your body is healing and getting used to being without cigarettes. Remember that withdrawal symptoms are easier to treat than the major diseases that smoking can cause.  Even after the withdrawal is over, expect periodic urges to smoke. However, these cravings are generally short lived and will go away whether you smoke or not. Do not smoke! WHAT RESOURCES ARE AVAILABLE TO HELP ME QUIT SMOKING? Your health care provider can direct you to community resources or hospitals for support, which may include:  Group support.  Education.  Hypnosis.  Therapy.   This information is not intended to replace advice given to you by your health care provider. Make sure you discuss any questions you have with your health care provider.   Document Released: 01/29/2004 Document Revised: 05/23/2014 Document Reviewed:  10/18/2012 Elsevier Interactive Patient Education [employer].      If you need a refill on your cardiac medications before your next appointment, please call your pharmacy.

## 2015-08-03 NOTE — Progress Notes (Signed)
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 male who presents today for electrophysiology followup.  Since his ablation he has done well.   Denies procedure related complications.  AF has much improved, though he has had some ERAF (lasting up to 15 minutes only).  No episodes in several weeks.  Continues to smoke and is not using CPAP.  Exercise tolerance has much improved post ablation with sinus rhythm.   Today, he denies symptoms of chest pain, shortness of breath, or  lower extremity edema.  He continues to smoke but is trying to cut back/ quit.  The patient is otherwise without complaint today.   Past Medical History  Diagnosis Date  . HLD (hyperlipidemia)     takes Pravastatin daily  . Obesity   . Tobacco abuse   . Overdose 2009    unintentional Flacanide overdose  . HTN (hypertension)     takes Prinizide daily  . Dyspnea     LHC 11/08: EF 60%, normal coronary arteries;  ETT-echo 2/11: normal at 71% PMHR  . Seasonal allergies     was on Claritin-stopped taking this pt states it wasn't helping  . Emphysema   . Short-term memory loss   . Arthritis     lower back  . Chronic back pain     DDD/stenosis  . GERD (gastroesophageal reflux disease)     takes Omeprazole bid  . Colon polyps     9 polyps removed 10/13/11  . Diabetes mellitus     takes Metformin and Glimepiride daily  . History of shingles   . Atrial flutter ([place])     s/p CTI ablation by Dr Rayann Heman  . Paroxysmal atrial fibrillation (HCC)   . Pneumonia 3-4 yrs ago    none recent  . OSA (obstructive sleep apnea)     not always using cpap  . Brain aneurysm 2009    questionable. A follow up CTA in 2009 showed no evidence of  . Peripheral neuropathy ([place])   . Complication of anesthesia September 11, 2012    slow to awaken after ablation   Past Surgical History  Procedure Laterality Date  . Neck surgery  73yrs ago  . Right shoulder surgery  4-42yrs ago    cyst removed  . Left  inguinal hernia repair      as a child  . Tee without cardioversion  05/05/2011    Procedure: TRANSESOPHAGEAL ECHOCARDIOGRAM (TEE);  Surgeon: Lelon Perla, MD;  Location: [facility] ENDOSCOPY;  Service: Cardiovascular;  Laterality: N/A;  . Cardioversion  05/05/2011    Procedure: CARDIOVERSION;  Surgeon: Lelon Perla, MD;  Location: [facility] ENDOSCOPY;  Service: Cardiovascular;  Laterality: N/A;  . Throat surgery  4-89yrs ago    "thought " it was cancer but came back not  . Carpal tunnel release  99/2000    bilateral  . Appendectomy  2-24yrs ago  . Knee surgery  6-75yrs ago    left  . Cardiac catheterization  2008    no significant CAD  . Tonsillectomy    . Hernia repair    . Anterior cervical decomp/discectomy fusion  01/04/2012    Procedure: ANTERIOR CERVICAL DECOMPRESSION/DISCECTOMY FUSION 1 LEVEL/HARDWARE REMOVAL;  Surgeon: Ophelia Charter, MD;  Location: [place] NEURO ORS;  Service: Neurosurgery;  Laterality: N/A;  explore cervical fusion Cervical six - seven  with removal of codman plate anterior cervical decompression with fusion interbody prothesis plating and bonegraft  . Cardioversion Bilateral  07/26/2012    Procedure: CARDIOVERSION;  Surgeon: Minus Breeding, MD;  Location: [place];  Service: Cardiovascular;  Laterality: Bilateral;  . Atrial flutter ablation  09/11/2012    CTI ablation by Dr Rayann Heman  . Colonoscopy with propofol N/A 12/13/2012    Procedure: COLONOSCOPY WITH PROPOFOL;  Surgeon: Milus Banister, MD;  Location: [facility] ENDOSCOPY;  Service: Endoscopy;  Laterality: N/A;  . Nasal septoplasty w/ turbinoplasty Bilateral 02/19/2014    Procedure: NASAL SEPTOPLASTY WITH BILATERAL TURBINATE REDUCTION;  Surgeon: Jodi Marble, MD;  Location: [place];  Service: ENT;  Laterality: Bilateral;  . Atrial fibrillation ablation N/A 09/11/2012    PT DID NOT HAVE AN ATRIAL FIBRILLATION ABLATION IN 2014!  ATRIAL FLUTTER ABLATION ONLY  . Electrophysiologic study N/A 04/21/2015    Procedure: Atrial  Fibrillation Ablation;  Surgeon: Thompson Grayer, MD;  Location: [place] CV LAB;  Service: Cardiovascular;  Laterality: N/A;  . Tee without cardioversion N/A 04/21/2015    Procedure: TRANSESOPHAGEAL ECHOCARDIOGRAM (TEE);  Surgeon: Sueanne Margarita, MD;  Location: [facility] ENDOSCOPY;  Service: Cardiovascular;  Laterality: N/A;    Current Outpatient Prescriptions  Medication Sig Dispense Refill  . albuterol (PROVENTIL HFA;VENTOLIN HFA) 108 (90 BASE) MCG/ACT inhaler Inhale 2 puffs into the lungs every 6 (six) hours as needed for wheezing or shortness of breath.    . bisoprolol (ZEBETA) 10 MG tablet Take 10 mg by mouth daily.    . Cholecalciferol (VITAMIN D) 2000 UNITS CAPS Take 1 capsule by mouth daily.    [name] diltiazem (CARDIZEM CD) 120 MG 24 hr capsule Take 1 capsule (120 mg total) by mouth daily. 30 capsule 6  . diltiazem (CARDIZEM) 60 MG tablet Take 0.5 tablets (30 mg total) by mouth 2 (two) times daily as needed (AFIB). 60 tablet 2  . dronedarone (MULTAQ) 400 MG tablet TAKE 1 TABLET BY MOUTH TWICE DAILY WITH A MEAL 60 tablet 11  . furosemide (LASIX) 40 MG tablet Take 1 tablet (40 mg total) by mouth daily as needed for fluid. 30 tablet 2  . glimepiride (AMARYL) 1 MG tablet Take 1 mg by mouth daily before breakfast.     . HYDROcodone-acetaminophen (NORCO/VICODIN) 5-325 MG per tablet Take 1 tablet by mouth every 6 (six) hours as needed for pain.    [name] ipratropium (ATROVENT) 0.03 % nasal spray Place 2 sprays into both nostrils every 12 (twelve) hours. 30 mL 12  . Liraglutide (VICTOZA) 18 MG/3ML SOPN Inject 12 mLs into the skin daily.     [name] lisinopril (PRINIVIL,ZESTRIL) 20 MG tablet TAKE 1 TABLET BY MOUTH DAILY 90 tablet 2  . metFORMIN (GLUCOPHAGE) 500 MG tablet Take 1,000 mg by mouth 2 (two) times daily with a meal.     . Multiple Vitamin (MULTIVITAMIN WITH MINERALS) TABS Take 1 tablet by mouth daily.    [name] omeprazole (PRILOSEC) 20 MG capsule Take 20 mg by mouth 2 (two) times daily.     [name]  oxyCODONE-acetaminophen (PERCOCET/ROXICET) 5-325 MG per tablet Take 1-2 tablets by mouth every 4 (four) hours as needed for moderate pain. 30 tablet 0  . potassium chloride (KLOR-CON 10) 10 MEQ tablet Take 10 mEq by mouth daily.     . pravastatin (PRAVACHOL) 80 MG tablet TAKE 1 TABLET BY MOUTH EVERY NIGHT AT BEDTIME 30 tablet 11  . warfarin (COUMADIN) 5 MG tablet Do not take coumadin 12/7 or 04/23/15.  Resume previous home dosing on 04/24/15. 45 tablet 3   No current facility-administered medications for this visit.    Physical  Exam: Filed Vitals:   08/03/15 0928  BP: 158/86  Pulse: 66  Height: 6\' 3"  (1.905 m)  Weight: 248 lb (112.492 kg)    GEN- The patient is overweight and chronically ill appearing, alert and oriented x 3 today.   Head- normocephalic, atraumatic Eyes-  Sclera clear, conjunctiva pink Ears- hearing intact Oropharynx- clear Lungs- prolonged expiratory wheezing,  Heart- Regular rate and rhythm, no murmurs, rubs or gallops, PMI not laterally displaced GI- soft, NT, ND, + BS Extremities- no clubbing, cyanosis, or edema,    ekg today reveals sinus rhythm   Assessment and Plan:  1. Paroxysmal Atrial fibrillation Doing well s/p ablation with some ERAF Continue multaq for now.  If episodes resolve, would stop multaq on return to see Butch Penny Continue long term anticoagulation  2. OSA Compliance encouraged Im afraid that his AF may return without adequate treatement  3. Tobacco Cessation strongly advised  4. Hypertension Elevated today Increase lisinopril to 40mg  daily Check bmet today Follow-up with PCP for bmet and repeat bp check in 4 weeks  Follow-up with Butch Penny in AF clinic every 3 months Follow-up with Dr Percival Spanish in 6 months I will see when needed  Thompson Grayer MD, [facility] 08/03/2015 10:20 AM

## 2015-08-06 NOTE — Addendum Note (Signed)
Addended by: Freada Bergeron on: 08/06/2015 05:38 PM   Modules accepted: Orders

## 2015-08-18 ENCOUNTER — Other Ambulatory Visit: Payer: Self-pay | Admitting: Cardiology

## 2015-08-25 ENCOUNTER — Ambulatory Visit (INDEPENDENT_AMBULATORY_CARE_PROVIDER_SITE_OTHER): Payer: BLUE CROSS/BLUE SHIELD | Admitting: *Deleted

## 2015-08-25 DIAGNOSIS — I4891 Unspecified atrial fibrillation: Secondary | ICD-10-CM

## 2015-08-25 DIAGNOSIS — Z7901 Long term (current) use of anticoagulants: Secondary | ICD-10-CM

## 2015-08-25 DIAGNOSIS — Z5181 Encounter for therapeutic drug level monitoring: Secondary | ICD-10-CM | POA: Diagnosis not present

## 2015-08-25 DIAGNOSIS — I480 Paroxysmal atrial fibrillation: Secondary | ICD-10-CM

## 2015-08-25 LAB — POCT INR: INR: 2.4

## 2015-08-28 ENCOUNTER — Other Ambulatory Visit: Payer: Self-pay | Admitting: Cardiology

## 2015-08-28 NOTE — Telephone Encounter (Signed)
REFILL 

## 2015-09-16 ENCOUNTER — Ambulatory Visit (INDEPENDENT_AMBULATORY_CARE_PROVIDER_SITE_OTHER): Payer: BLUE CROSS/BLUE SHIELD | Admitting: Pulmonary Disease

## 2015-09-16 DIAGNOSIS — J449 Chronic obstructive pulmonary disease, unspecified: Secondary | ICD-10-CM

## 2015-09-16 LAB — PULMONARY FUNCTION TEST
FEF 25-75 POST: 2.47 L/s
FEF 25-75 PRE: 1.5 L/s
FEF2575-%Change-Post: 64 %
FEF2575-%PRED-PRE: 43 %
FEF2575-%Pred-Post: 70 %
FEV1-%Change-Post: 16 %
FEV1-%PRED-POST: 65 %
FEV1-%PRED-PRE: 56 %
FEV1-POST: 2.79 L
FEV1-Pre: 2.39 L
FEV1FVC-%Change-Post: 1 %
FEV1FVC-%PRED-PRE: 88 %
FEV6-%Change-Post: 13 %
FEV6-%PRED-POST: 75 %
FEV6-%PRED-PRE: 66 %
FEV6-POST: 4.04 L
FEV6-Pre: 3.54 L
FEV6FVC-%CHANGE-POST: 0 %
FEV6FVC-%PRED-POST: 102 %
FEV6FVC-%Pred-Pre: 103 %
FVC-%Change-Post: 14 %
FVC-%PRED-PRE: 64 %
FVC-%Pred-Post: 73 %
FVC-POST: 4.09 L
FVC-Pre: 3.57 L
PRE FEV1/FVC RATIO: 67 %
Post FEV1/FVC ratio: 68 %
Post FEV6/FVC ratio: 99 %
Pre FEV6/FVC Ratio: 99 %

## 2015-09-16 NOTE — Progress Notes (Signed)
Spirometry pre and post done today. 

## 2015-09-18 ENCOUNTER — Ambulatory Visit (INDEPENDENT_AMBULATORY_CARE_PROVIDER_SITE_OTHER): Payer: BLUE CROSS/BLUE SHIELD | Admitting: *Deleted

## 2015-09-18 DIAGNOSIS — Z7901 Long term (current) use of anticoagulants: Secondary | ICD-10-CM | POA: Diagnosis not present

## 2015-09-18 DIAGNOSIS — Z5181 Encounter for therapeutic drug level monitoring: Secondary | ICD-10-CM | POA: Diagnosis not present

## 2015-09-18 DIAGNOSIS — I4891 Unspecified atrial fibrillation: Secondary | ICD-10-CM

## 2015-09-18 DIAGNOSIS — I480 Paroxysmal atrial fibrillation: Secondary | ICD-10-CM | POA: Diagnosis not present

## 2015-09-18 LAB — POCT INR: INR: 3.5

## 2015-09-28 ENCOUNTER — Ambulatory Visit (INDEPENDENT_AMBULATORY_CARE_PROVIDER_SITE_OTHER): Payer: BLUE CROSS/BLUE SHIELD | Admitting: *Deleted

## 2015-09-28 DIAGNOSIS — I4891 Unspecified atrial fibrillation: Secondary | ICD-10-CM | POA: Diagnosis not present

## 2015-09-28 DIAGNOSIS — Z5181 Encounter for therapeutic drug level monitoring: Secondary | ICD-10-CM | POA: Diagnosis not present

## 2015-09-28 DIAGNOSIS — I480 Paroxysmal atrial fibrillation: Secondary | ICD-10-CM | POA: Diagnosis not present

## 2015-09-28 DIAGNOSIS — Z7901 Long term (current) use of anticoagulants: Secondary | ICD-10-CM

## 2015-09-28 LAB — POCT INR: INR: 2.1

## 2015-10-13 ENCOUNTER — Telehealth: Payer: Self-pay | Admitting: *Deleted

## 2015-10-13 NOTE — Telephone Encounter (Signed)
Pt called and stated started on Flomax 0.4mg  daily on Friday and took it Friday Saturday and Sunday then on Monday had episode of AF. Spoke with Pharmacist and she states there is no contraindication with him taking Flomax and his other medications. Pt instructed regarding this and informed no interaction between coumadin and Flomax and he states understanding . Also states that he is not in AF at present

## 2015-10-19 ENCOUNTER — Ambulatory Visit (INDEPENDENT_AMBULATORY_CARE_PROVIDER_SITE_OTHER): Payer: BLUE CROSS/BLUE SHIELD | Admitting: *Deleted

## 2015-10-19 DIAGNOSIS — Z7901 Long term (current) use of anticoagulants: Secondary | ICD-10-CM | POA: Diagnosis not present

## 2015-10-19 DIAGNOSIS — I480 Paroxysmal atrial fibrillation: Secondary | ICD-10-CM | POA: Diagnosis not present

## 2015-10-19 DIAGNOSIS — Z5181 Encounter for therapeutic drug level monitoring: Secondary | ICD-10-CM

## 2015-10-19 DIAGNOSIS — I4891 Unspecified atrial fibrillation: Secondary | ICD-10-CM | POA: Diagnosis not present

## 2015-10-19 LAB — POCT INR: INR: 2.4

## 2015-11-04 ENCOUNTER — Other Ambulatory Visit: Payer: Self-pay

## 2015-11-04 ENCOUNTER — Ambulatory Visit (HOSPITAL_COMMUNITY)
Admission: RE | Admit: 2015-11-04 | Discharge: 2015-11-04 | Disposition: A | Discharge status: Home / Self Care | Payer: BLUE CROSS/BLUE SHIELD | Source: Ambulatory Visit | Attending: Nurse Practitioner | Admitting: Nurse Practitioner

## 2015-11-04 ENCOUNTER — Encounter (HOSPITAL_COMMUNITY): Payer: Self-pay | Admitting: Nurse Practitioner

## 2015-11-04 VITALS — BP 178/96 | HR 72 | Ht 75.0 in | Wt 249.8 lb

## 2015-11-04 DIAGNOSIS — K219 Gastro-esophageal reflux disease without esophagitis: Secondary | ICD-10-CM | POA: Insufficient documentation

## 2015-11-04 DIAGNOSIS — G4733 Obstructive sleep apnea (adult) (pediatric): Secondary | ICD-10-CM | POA: Insufficient documentation

## 2015-11-04 DIAGNOSIS — Z9889 Other specified postprocedural states: Secondary | ICD-10-CM | POA: Diagnosis not present

## 2015-11-04 DIAGNOSIS — Z888 Allergy status to other drugs, medicaments and biological substances status: Secondary | ICD-10-CM | POA: Insufficient documentation

## 2015-11-04 DIAGNOSIS — Z79899 Other long term (current) drug therapy: Secondary | ICD-10-CM | POA: Insufficient documentation

## 2015-11-04 DIAGNOSIS — I10 Essential (primary) hypertension: Secondary | ICD-10-CM | POA: Diagnosis not present

## 2015-11-04 DIAGNOSIS — E669 Obesity, unspecified: Secondary | ICD-10-CM | POA: Diagnosis not present

## 2015-11-04 DIAGNOSIS — I4891 Unspecified atrial fibrillation: Secondary | ICD-10-CM | POA: Insufficient documentation

## 2015-11-04 DIAGNOSIS — E785 Hyperlipidemia, unspecified: Secondary | ICD-10-CM | POA: Diagnosis not present

## 2015-11-04 DIAGNOSIS — F17219 Nicotine dependence, cigarettes, with unspecified nicotine-induced disorders: Secondary | ICD-10-CM | POA: Insufficient documentation

## 2015-11-04 NOTE — Progress Notes (Signed)
Patient ID: [name], male   DOB: 12/04/1955, 60 y.o.   MRN: [id number]      Primary Care Physician: [name], MD Referring Physician: Dr. Maude Leriche is a 60 y.o. male with a h/o afib ablation 12/6  seen in the afib clinic 07/06/15 for PAF. He has been using his multaq once a day over the weekend because he couldn't afford.He had assistance last year til now. I do have a copay card he can use with $0 co pay. He states no afib at the time of ablation and it has ben increasing by a little at a time, but has become more consistent over the weekend. He continues on Cardizem and bisoprolol. He states that he has sleep apnea that he is not treating because he needs another mask. He has an appointment with Dr. Elsworth Soho 3/1 and can discuss further. He has had some surgery on his nose and does not believe he is snoring as much.Continues on warfarin with last INR 1.9 EKG shows SR with PAC's but on ascultation was regular.  Seen back 6/21 and in SR, having some afib episodes but with low burden. Per Dr. Jackalyn Lombard last note, if afib burden is low, he can try stopping multaq and he would like to do this.Continues on warfarin.  Today, he denies symptoms of palpitations, chest pain, shortness of breath, orthopnea, PND, lower extremity edema, dizziness, presyncope, syncope, or neurologic sequela. The patient is tolerating medications without difficulties and is otherwise without complaint today.   Past Medical History  Diagnosis Date  . HLD (hyperlipidemia)     takes Pravastatin daily  . Obesity   . Tobacco abuse   . Overdose 2009    unintentional Flacanide overdose  . HTN (hypertension)     takes Prinizide daily  . Dyspnea     LHC 11/08: EF 60%, normal coronary arteries;  ETT-echo 2/11: normal at 71% PMHR  . Seasonal allergies     was on Claritin-stopped taking this pt states it wasn't helping  . Emphysema   . Short-term memory loss   . Arthritis     lower back  . Chronic back  pain     DDD/stenosis  . GERD (gastroesophageal reflux disease)     takes Omeprazole bid  . Colon polyps     9 polyps removed 10/13/11  . Diabetes mellitus     takes Metformin and Glimepiride daily  . History of shingles   . Atrial flutter ([place])     s/p CTI ablation by Dr Rayann Heman  . Paroxysmal atrial fibrillation (HCC)   . Pneumonia 3-4 yrs ago    none recent  . OSA (obstructive sleep apnea)     not always using cpap  . Brain aneurysm 2009    questionable. A follow up CTA in 2009 showed no evidence of  . Peripheral neuropathy ([place])   . Complication of anesthesia September 11, 2012    slow to awaken after ablation   Past Surgical History  Procedure Laterality Date  . Neck surgery  42yrs ago  . Right shoulder surgery  4-8yrs ago    cyst removed  . Left inguinal hernia repair      as a child  . Tee without cardioversion  05/05/2011    Procedure: TRANSESOPHAGEAL ECHOCARDIOGRAM (TEE);  Surgeon: Lelon Perla, MD;  Location: [facility] ENDOSCOPY;  Service: Cardiovascular;  Laterality: N/A;  . Cardioversion  05/05/2011    Procedure: CARDIOVERSION;  Surgeon: Lelon Perla,  MD;  Location: [facility] ENDOSCOPY;  Service: Cardiovascular;  Laterality: N/A;  . Throat surgery  4-29yrs ago    "thought " it was cancer but came back not  . Carpal tunnel release  99/2000    bilateral  . Appendectomy  2-3yrs ago  . Knee surgery  6-50yrs ago    left  . Cardiac catheterization  2008    no significant CAD  . Tonsillectomy    . Hernia repair    . Anterior cervical decomp/discectomy fusion  01/04/2012    Procedure: ANTERIOR CERVICAL DECOMPRESSION/DISCECTOMY FUSION 1 LEVEL/HARDWARE REMOVAL;  Surgeon: Ophelia Charter, MD;  Location: [place] NEURO ORS;  Service: Neurosurgery;  Laterality: N/A;  explore cervical fusion Cervical six - seven  with removal of codman plate anterior cervical decompression with fusion interbody prothesis plating and bonegraft  . Cardioversion Bilateral 07/26/2012    Procedure: CARDIOVERSION;   Surgeon: Minus Breeding, MD;  Location: [place];  Service: Cardiovascular;  Laterality: Bilateral;  . Atrial flutter ablation  09/11/2012    CTI ablation by Dr Rayann Heman  . Colonoscopy with propofol N/A 12/13/2012    Procedure: COLONOSCOPY WITH PROPOFOL;  Surgeon: Milus Banister, MD;  Location: [facility] ENDOSCOPY;  Service: Endoscopy;  Laterality: N/A;  . Nasal septoplasty w/ turbinoplasty Bilateral 02/19/2014    Procedure: NASAL SEPTOPLASTY WITH BILATERAL TURBINATE REDUCTION;  Surgeon: Jodi Marble, MD;  Location: [place];  Service: ENT;  Laterality: Bilateral;  . Atrial fibrillation ablation N/A 09/11/2012    PT DID NOT HAVE AN ATRIAL FIBRILLATION ABLATION IN 2014!  ATRIAL FLUTTER ABLATION ONLY  . Electrophysiologic study N/A 04/21/2015    Procedure: Atrial Fibrillation Ablation;  Surgeon: Thompson Grayer, MD;  Location: [place] CV LAB;  Service: Cardiovascular;  Laterality: N/A;  . Tee without cardioversion N/A 04/21/2015    Procedure: TRANSESOPHAGEAL ECHOCARDIOGRAM (TEE);  Surgeon: Sueanne Margarita, MD;  Location: [facility] ENDOSCOPY;  Service: Cardiovascular;  Laterality: N/A;    Current Outpatient Prescriptions  Medication Sig Dispense Refill  . albuterol (PROVENTIL HFA;VENTOLIN HFA) 108 (90 BASE) MCG/ACT inhaler Inhale 2 puffs into the lungs every 6 (six) hours as needed for wheezing or shortness of breath.    . bisoprolol (ZEBETA) 10 MG tablet Take 10 mg by mouth daily.    . Cholecalciferol (VITAMIN D) 2000 UNITS CAPS Take 1 capsule by mouth daily.    [name] diltiazem (CARDIZEM CD) 120 MG 24 hr capsule TAKE 1 CAPSULE BY MOUTH DAILY 30 capsule 11  . diltiazem (CARDIZEM) 60 MG tablet Take 0.5 tablets (30 mg total) by mouth 2 (two) times daily as needed (AFIB). 60 tablet 2  . furosemide (LASIX) 40 MG tablet Take 1 tablet (40 mg total) by mouth daily as needed for fluid. 30 tablet 2  . glimepiride (AMARYL) 1 MG tablet Take 1 mg by mouth daily before breakfast.     . HYDROcodone-acetaminophen (NORCO/VICODIN)  5-325 MG per tablet Take 1 tablet by mouth every 6 (six) hours as needed for pain.    [name] ipratropium (ATROVENT) 0.03 % nasal spray Place 2 sprays into both nostrils every 12 (twelve) hours. 30 mL 12  . Liraglutide (VICTOZA) 18 MG/3ML SOPN Inject 12 mLs into the skin daily.     [name] lisinopril (PRINIVIL,ZESTRIL) 40 MG tablet Take 1 tablet (40 mg total) by mouth daily. 90 tablet 3  . metFORMIN (GLUCOPHAGE) 500 MG tablet Take 1,000 mg by mouth 2 (two) times daily with a meal.     . Multiple Vitamin (MULTIVITAMIN WITH MINERALS) TABS Take  1 tablet by mouth daily.    [name] omeprazole (PRILOSEC) 20 MG capsule Take 20 mg by mouth 2 (two) times daily.     . potassium chloride (KLOR-CON 10) 10 MEQ tablet Take 10 mEq by mouth daily.     . pravastatin (PRAVACHOL) 80 MG tablet TAKE 1 TABLET BY MOUTH EVERY NIGHT AT BEDTIME 30 tablet 11  . warfarin (COUMADIN) 5 MG tablet TAKE 1 TO 1 AND 1/2 TABLETS BY MOUTH DAILY AS DIRECTED 45 tablet 5  . oxyCODONE-acetaminophen (PERCOCET/ROXICET) 5-325 MG per tablet Take 1-2 tablets by mouth every 4 (four) hours as needed for moderate pain. (Patient not taking: Reported on 11/04/2015) 30 tablet 0  . tamsulosin (FLOMAX) 0.4 MG CAPS capsule Take 0.4 mg by mouth daily. Reported on 11/04/2015     No current facility-administered medications for this encounter.    Allergies  Allergen Reactions  . Adhesive [Tape]     itching  . Latex Itching    When tape is on the skin too long skin gets red & itching    Social History   Social History  . Marital Status: Married    Spouse Name: N/A  . Number of Children: 2  . Years of Education: N/A   Occupational History  . Mechanic     Social History Main Topics  . Smoking status: Current Every Day Smoker -- 0.30 packs/day for 40 years    Types: Cigarettes  . Smokeless tobacco: Never Used  . Alcohol Use: 0.0 oz/week    0 Standard drinks or equivalent per week     Comment: couple of times a month liquor, he tells me he typically drinks  2-4 drinks most days but hasnt drank anything in 6 weeks  . Drug Use: No  . Sexual Activity: Yes   Other Topics Concern  . Not on file   Social History Narrative   Daily caffeine(Mountain Dew)       Lives in [place] with spouse.   Unemployed due to chronic back/ leg pain          Family History  Problem Relation Age of Onset  . Colon cancer Neg Hx   . Anesthesia problems Neg Hx   . Hypotension Neg Hx   . Malignant hyperthermia Neg Hx   . Pseudochol deficiency Neg Hx     ROS- All systems are reviewed and negative except as per the HPI above  Physical Exam: Filed Vitals:   11/04/15 1121  BP: 178/96  Pulse: 72  Height: 6\' 3"  (1.905 m)  Weight: 249 lb 12.8 oz (113.309 kg)    GEN- The patient is well appearing, alert and oriented x 3 today.   Head- normocephalic, atraumatic Eyes-  Sclera clear, conjunctiva pink Ears- hearing intact Oropharynx- clear Neck- supple, no JVP Lymph- no cervical lymphadenopathy Lungs- Clear to ausculation bilaterally, normal work of breathing Heart- Regular rate and rhythm, no murmurs, rubs or gallops, PMI not laterally displaced GI- soft, NT, ND, + BS Extremities- no clubbing, cyanosis, or edema MS- no significant deformity or atrophy Skin- no rash or lesion Psych- euthymic mood, full affect Neuro- strength and sensation are intact  EKG-NSR at 72 bpm, pr int 168 ms, qrs int 78 ms, qtc 451 ms Epic records reviewed  Assessment and Plan:  1. Afib S/p ablation 12/16 In SR, low afib burden  Will try stopping multaq, if has any afib can try prn cardizem Continue warfarin, bisoprolol, daily diltiazem   2. Tobacco use Encouraged to stop  smoking Encouraged to use CPAP  3. HTN Not optimal today  He states that he had a cheese, egg, sausage croissant  at 6a today and then stopped on the way here and got a sausage biscuit Encouraged to avoid salt   F/u with Dr. Percival Spanish in 3 months  Geroge Baseman. [place] [place], [facility] [address] [place], [place] [address] [phone]

## 2015-11-04 NOTE — Patient Instructions (Signed)
Your physician has recommended you make the following change in your medication:  1)Stop Multaq   Follow up with Dr. Percival Spanish in September 2017

## 2015-11-05 ENCOUNTER — Other Ambulatory Visit: Payer: Self-pay | Admitting: Neurosurgery

## 2015-11-05 DIAGNOSIS — M5416 Radiculopathy, lumbar region: Secondary | ICD-10-CM

## 2015-11-15 ENCOUNTER — Ambulatory Visit
Admission: RE | Admit: 2015-11-15 | Discharge: 2015-11-15 | Disposition: A | Discharge status: Home / Self Care | Payer: BLUE CROSS/BLUE SHIELD | Source: Ambulatory Visit | Attending: Neurosurgery | Admitting: Neurosurgery

## 2015-11-15 DIAGNOSIS — M5416 Radiculopathy, lumbar region: Secondary | ICD-10-CM

## 2015-11-15 MED ORDER — GADOBENATE DIMEGLUMINE 529 MG/ML IV SOLN
20.0000 mL | Freq: Once | INTRAVENOUS | Status: AC | PRN
  Administered 2015-11-15: 20 mL via INTRAVENOUS

## 2015-11-16 ENCOUNTER — Ambulatory Visit (INDEPENDENT_AMBULATORY_CARE_PROVIDER_SITE_OTHER): Payer: BLUE CROSS/BLUE SHIELD | Admitting: *Deleted

## 2015-11-16 DIAGNOSIS — I4891 Unspecified atrial fibrillation: Secondary | ICD-10-CM

## 2015-11-16 DIAGNOSIS — Z5181 Encounter for therapeutic drug level monitoring: Secondary | ICD-10-CM | POA: Diagnosis not present

## 2015-11-16 DIAGNOSIS — I480 Paroxysmal atrial fibrillation: Secondary | ICD-10-CM

## 2015-11-16 DIAGNOSIS — Z7901 Long term (current) use of anticoagulants: Secondary | ICD-10-CM

## 2015-11-16 LAB — POCT INR: INR: 1.9

## 2015-12-14 ENCOUNTER — Ambulatory Visit (INDEPENDENT_AMBULATORY_CARE_PROVIDER_SITE_OTHER): Payer: BLUE CROSS/BLUE SHIELD | Admitting: *Deleted

## 2015-12-14 DIAGNOSIS — Z5181 Encounter for therapeutic drug level monitoring: Secondary | ICD-10-CM

## 2015-12-14 DIAGNOSIS — I480 Paroxysmal atrial fibrillation: Secondary | ICD-10-CM

## 2015-12-14 LAB — POCT INR: INR: 2.4

## 2016-01-13 ENCOUNTER — Ambulatory Visit (INDEPENDENT_AMBULATORY_CARE_PROVIDER_SITE_OTHER): Payer: BLUE CROSS/BLUE SHIELD | Admitting: *Deleted

## 2016-01-13 DIAGNOSIS — Z5181 Encounter for therapeutic drug level monitoring: Secondary | ICD-10-CM

## 2016-01-13 DIAGNOSIS — I480 Paroxysmal atrial fibrillation: Secondary | ICD-10-CM | POA: Diagnosis not present

## 2016-01-13 LAB — POCT INR: INR: 2.5

## 2016-01-15 ENCOUNTER — Ambulatory Visit (INDEPENDENT_AMBULATORY_CARE_PROVIDER_SITE_OTHER): Payer: BLUE CROSS/BLUE SHIELD | Admitting: Adult Health

## 2016-01-15 ENCOUNTER — Encounter: Payer: Self-pay | Admitting: Adult Health

## 2016-01-15 DIAGNOSIS — G4733 Obstructive sleep apnea (adult) (pediatric): Secondary | ICD-10-CM | POA: Diagnosis not present

## 2016-01-15 DIAGNOSIS — J449 Chronic obstructive pulmonary disease, unspecified: Secondary | ICD-10-CM

## 2016-01-15 NOTE — Progress Notes (Signed)
Subjective:    Patient ID: [name], male    DOB: 04-Jul-1955, 60 y.o.   MRN: [id number]  HPI  PCP - [name]   60/M, smoker  followed for  COPD & sleep apnea.  He had atrial fibrillation treated with flecainide in the past, now on multq& coumadin. He had TEE/DCCV.  He was  a Estate manager/land agent & now disabled.   Significant tests/ events  Spirometry 01/2010 showed ratio of 68, FEV 1 was 72%, smaller airways 55%, no significant BD response   PSG from [facility] - severe with AHI 31.h corrected by CPAP 5 cm (wt was 250 lbs)  11/2011 Underwent neck sx >> given pre-op prednisone for asthmatic bronchitis -uneventful post op    09/2012 FEV1 67%, no BD response Spirometry 2015 with FEV1 59%, ratio 61  PFT 09/16/2015 FEV1 65%, ratio 68, FVC 73%, positive bronchodilator response   01/15/2016 Follow up COPD /OSA  Returns for 1 year follow up .  Uses albuterol As needed-rare use.  Not on controller use. Does not want to start one.  Continues to smoke, advised on cessation  ON ACE denies flare of cough .  Last cxr 04/2015 with bronchitic changes.  PVX utd .  No ER or hospitalizations.  Suppose to be on CPAP but does not wear it.  Encouraged to wear it.  No chest pain, hemoptysis edema , n/v/d.    Past Medical History:  Diagnosis Date  . Arthritis    lower back  . Atrial flutter ([place])    s/p CTI ablation by Dr Rayann Heman  . Brain aneurysm 2009   questionable. A follow up CTA in 2009 showed no evidence of  . Chronic back pain    DDD/stenosis  . Colon polyps    9 polyps removed 10/13/11  . Complication of anesthesia September 11, 2012   slow to awaken after ablation  . Diabetes mellitus    takes Metformin and Glimepiride daily  . Dyspnea    LHC 11/08: EF 60%, normal coronary arteries;  ETT-echo 2/11: normal at 71% PMHR  . Emphysema   . GERD (gastroesophageal reflux disease)    takes Omeprazole bid  . History of shingles   . HLD (hyperlipidemia)    takes Pravastatin daily  .  HTN (hypertension)    takes Prinizide daily  . Obesity   . OSA (obstructive sleep apnea)    not always using cpap  . Overdose 2009   unintentional Flacanide overdose  . Paroxysmal atrial fibrillation (HCC)   . Peripheral neuropathy ([place])   . Pneumonia 3-4 yrs ago   none recent  . Seasonal allergies    was on Claritin-stopped taking this pt states it wasn't helping  . Short-term memory loss   . Tobacco abuse    Current Outpatient Prescriptions on File Prior to Visit  Medication Sig Dispense Refill  . albuterol (PROVENTIL HFA;VENTOLIN HFA) 108 (90 BASE) MCG/ACT inhaler Inhale 2 puffs into the lungs every 6 (six) hours as needed for wheezing or shortness of breath.    . bisoprolol (ZEBETA) 10 MG tablet Take 10 mg by mouth daily.    . Cholecalciferol (VITAMIN D) 2000 UNITS CAPS Take 1 capsule by mouth daily.    [name] diltiazem (CARDIZEM CD) 120 MG 24 hr capsule TAKE 1 CAPSULE BY MOUTH DAILY 30 capsule 11  . furosemide (LASIX) 40 MG tablet Take 1 tablet (40 mg total) by mouth daily as needed for fluid. 30 tablet 2  . glimepiride (  AMARYL) 1 MG tablet Take 1 mg by mouth daily before breakfast.     . HYDROcodone-acetaminophen (NORCO/VICODIN) 5-325 MG per tablet Take 1 tablet by mouth every 6 (six) hours as needed for pain.    [name] ipratropium (ATROVENT) 0.03 % nasal spray Place 2 sprays into both nostrils every 12 (twelve) hours. 30 mL 12  . Liraglutide (VICTOZA) 18 MG/3ML SOPN Inject 12 mLs into the skin daily.     [name] lisinopril (PRINIVIL,ZESTRIL) 40 MG tablet Take 1 tablet (40 mg total) by mouth daily. 90 tablet 3  . metFORMIN (GLUCOPHAGE) 500 MG tablet Take 1,000 mg by mouth 2 (two) times daily with a meal.     . Multiple Vitamin (MULTIVITAMIN WITH MINERALS) TABS Take 1 tablet by mouth daily.    [name] omeprazole (PRILOSEC) 20 MG capsule Take 20 mg by mouth 2 (two) times daily.     . potassium chloride (KLOR-CON 10) 10 MEQ tablet Take 10 mEq by mouth daily.     . pravastatin (PRAVACHOL) 80 MG tablet  TAKE 1 TABLET BY MOUTH EVERY NIGHT AT BEDTIME 30 tablet 11  . tamsulosin (FLOMAX) 0.4 MG CAPS capsule Take 0.4 mg by mouth daily. Reported on 11/04/2015    . warfarin (COUMADIN) 5 MG tablet TAKE 1 TO 1 AND 1/2 TABLETS BY MOUTH DAILY AS DIRECTED 45 tablet 5   No current facility-administered medications on file prior to visit.      Review of Systems neg for any significant sore throat, dysphagia, itching, sneezing, nasal congestion or excess/ purulent secretions, fever, chills, sweats, unintended wt loss, pleuritic or exertional cp, hempoptysis, orthopnea pnd or change in chronic leg swelling. Also denies presyncope, palpitations, heartburn, abdominal pain, nausea, vomiting, diarrhea or change in bowel or urinary habits, dysuria,hematuria, rash, arthralgias, visual complaints, headache, numbness weakness or ataxia.     Objective:   Physical Exam Vitals:   01/15/16 1015 01/15/16 1016  Pulse:  76  SpO2:  96%  Weight: 258 lb (117 kg)   Height: 6\' 2"  (1.88 m)     Gen. Pleasant, tall, in no distress ENT - no lesions, no post nasal drip, class 2 airway Neck: No JVD, no thyromegaly, no carotid bruits Lungs: no use of accessory muscles, no dullness to percussion, decreased without rales or rhonchi  Cardiovascular: Rhythm regular, heart sounds  normal, no murmurs or gallops, no peripheral edema Musculoskeletal: No deformities, no cyanosis or clubbing , no tremors   Kyasia Steuck NP-C  [facility]  01/15/2016

## 2016-01-15 NOTE — Patient Instructions (Addendum)
Must quit smoking -most important goal .  May use Pro Air 2 puffs or Albuterol Neb every 4hr as needed.  Work on weight loss.  Wear CPAP as able each night.  Follow up Dr. Elsworth Soho  In 1 year and As needed

## 2016-01-15 NOTE — Assessment & Plan Note (Signed)
Severe OSA -CPAP noncompliance   Plan  Patient Instructions  Must quit smoking -most important goal .  May use Pro Air 2 puffs or Albuterol Neb every 4hr as needed.  Work on weight loss.  Wear CPAP as able each night.  Follow up Dr. Elsworth Soho  In 1 year and As needed

## 2016-01-15 NOTE — Assessment & Plan Note (Signed)
Compensated without flare  Would benefit from controller such as ICS/LABA but he declines at this time.   Plan  Patient Instructions  Must quit smoking -most important goal .  May use Pro Air 2 puffs or Albuterol Neb every 4hr as needed.  Work on weight loss.  Wear CPAP as able each night.  Follow up Dr. Elsworth Soho  In 1 year and As needed

## 2016-01-19 ENCOUNTER — Ambulatory Visit (INDEPENDENT_AMBULATORY_CARE_PROVIDER_SITE_OTHER): Payer: BLUE CROSS/BLUE SHIELD

## 2016-01-19 DIAGNOSIS — Z23 Encounter for immunization: Secondary | ICD-10-CM

## 2016-01-30 NOTE — Progress Notes (Signed)
Reviewed & agree with plan  

## 2016-02-17 ENCOUNTER — Ambulatory Visit (INDEPENDENT_AMBULATORY_CARE_PROVIDER_SITE_OTHER): Payer: BLUE CROSS/BLUE SHIELD | Admitting: *Deleted

## 2016-02-17 DIAGNOSIS — I480 Paroxysmal atrial fibrillation: Secondary | ICD-10-CM | POA: Diagnosis not present

## 2016-02-17 DIAGNOSIS — Z5181 Encounter for therapeutic drug level monitoring: Secondary | ICD-10-CM

## 2016-02-17 LAB — POCT INR: INR: 2.1

## 2016-03-06 ENCOUNTER — Emergency Department (HOSPITAL_COMMUNITY): Payer: BLUE CROSS/BLUE SHIELD

## 2016-03-06 ENCOUNTER — Emergency Department (HOSPITAL_COMMUNITY)
Admission: EM | Admit: 2016-03-06 | Discharge: 2016-03-07 | Disposition: A | Discharge status: Home / Self Care | Payer: BLUE CROSS/BLUE SHIELD | Attending: Emergency Medicine | Admitting: Emergency Medicine

## 2016-03-06 ENCOUNTER — Encounter (HOSPITAL_COMMUNITY): Payer: Self-pay

## 2016-03-06 DIAGNOSIS — Z7984 Long term (current) use of oral hypoglycemic drugs: Secondary | ICD-10-CM | POA: Diagnosis not present

## 2016-03-06 DIAGNOSIS — R05 Cough: Secondary | ICD-10-CM | POA: Diagnosis present

## 2016-03-06 DIAGNOSIS — F17210 Nicotine dependence, cigarettes, uncomplicated: Secondary | ICD-10-CM | POA: Diagnosis not present

## 2016-03-06 DIAGNOSIS — J441 Chronic obstructive pulmonary disease with (acute) exacerbation: Secondary | ICD-10-CM | POA: Diagnosis not present

## 2016-03-06 DIAGNOSIS — R0609 Other forms of dyspnea: Secondary | ICD-10-CM | POA: Diagnosis not present

## 2016-03-06 DIAGNOSIS — E1140 Type 2 diabetes mellitus with diabetic neuropathy, unspecified: Secondary | ICD-10-CM | POA: Diagnosis not present

## 2016-03-06 DIAGNOSIS — I10 Essential (primary) hypertension: Secondary | ICD-10-CM | POA: Insufficient documentation

## 2016-03-06 DIAGNOSIS — R109 Unspecified abdominal pain: Secondary | ICD-10-CM | POA: Insufficient documentation

## 2016-03-06 DIAGNOSIS — Z91040 Latex allergy status: Secondary | ICD-10-CM | POA: Diagnosis not present

## 2016-03-06 LAB — I-STAT CHEM 8, ED
BUN: 14 mg/dL (ref 6–20)
CHLORIDE: 99 mmol/L — AB (ref 101–111)
Calcium, Ion: 1.15 mmol/L (ref 1.15–1.40)
Creatinine, Ser: 0.9 mg/dL (ref 0.61–1.24)
Glucose, Bld: 135 mg/dL — ABNORMAL HIGH (ref 65–99)
HEMATOCRIT: 37 % — AB (ref 39.0–52.0)
Hemoglobin: 12.6 g/dL — ABNORMAL LOW (ref 13.0–17.0)
Potassium: 4 mmol/L (ref 3.5–5.1)
SODIUM: 139 mmol/L (ref 135–145)
TCO2: 29 mmol/L (ref 0–100)

## 2016-03-06 LAB — PROTIME-INR
INR: 1.39
PROTHROMBIN TIME: 17.2 s — AB (ref 11.4–15.2)

## 2016-03-06 MED ORDER — ALBUTEROL (5 MG/ML) CONTINUOUS INHALATION SOLN
15.0000 mg/h | INHALATION_SOLUTION | RESPIRATORY_TRACT | Status: DC
  Administered 2016-03-06: 15 mg/h via RESPIRATORY_TRACT
  Filled 2016-03-06: qty 20

## 2016-03-06 MED ORDER — IPRATROPIUM-ALBUTEROL 0.5-2.5 (3) MG/3ML IN SOLN: 3.0000 mL | Freq: Three times a day (TID) | RESPIRATORY_TRACT | Status: DC

## 2016-03-06 MED ORDER — IPRATROPIUM BROMIDE 0.02 % IN SOLN
0.5000 mg | Freq: Once | RESPIRATORY_TRACT | Status: AC
  Administered 2016-03-06: 0.5 mg via RESPIRATORY_TRACT
  Filled 2016-03-06: qty 2.5

## 2016-03-06 MED ORDER — IOPAMIDOL (ISOVUE-300) INJECTION 61%
INTRAVENOUS | Status: AC
  Administered 2016-03-06: 100 mL
  Filled 2016-03-06: qty 100

## 2016-03-06 NOTE — ED Notes (Signed)
MD at bedside. 

## 2016-03-06 NOTE — ED Notes (Signed)
Patient transported to CT 

## 2016-03-06 NOTE — ED Provider Notes (Signed)
[place] DEPT Provider Note   CSN: [id number] Arrival date & time: 03/06/16  2148     History   Chief Complaint Chief Complaint  Patient presents with  . Weakness    weakness since Friday    HPI [name] is a 60 y.o. male.   Wheezing   This is a new problem. The current episode started more than 2 days ago. The problem occurs constantly. The problem has not changed since onset.Associated symptoms include cough. Pertinent negatives include no chest pain, no diarrhea and no sore throat. There was no precipitant for this problem. He has tried nothing for the symptoms. He has had no prior hospitalizations. He has had prior ED visits. He has had no prior ICU admissions. His past medical history does not include asthma or bronchiolitis.  Weakness  Pertinent negatives include no chest pain.    Past Medical History:  Diagnosis Date  . Arthritis    lower back  . Asthma   . Atrial flutter ([place])    s/p CTI ablation by Dr Rayann Heman  . Brain aneurysm 2009   questionable. A follow up CTA in 2009 showed no evidence of  . Chronic back pain    DDD/stenosis  . Colon polyps    9 polyps removed 10/13/11  . Complication of anesthesia September 11, 2012   slow to awaken after ablation  . Diabetes mellitus    takes Metformin and Glimepiride daily  . Dyspnea    LHC 11/08: EF 60%, normal coronary arteries;  ETT-echo 2/11: normal at 71% PMHR  . Emphysema   . GERD (gastroesophageal reflux disease)    takes Omeprazole bid  . History of shingles   . HLD (hyperlipidemia)    takes Pravastatin daily  . HTN (hypertension)    takes Prinizide daily  . Obesity   . OSA (obstructive sleep apnea)    not always using cpap  . Overdose 2009   unintentional Flacanide overdose  . Paroxysmal atrial fibrillation (HCC)   . Peripheral neuropathy ([place])   . Pneumonia 3-4 yrs ago   none recent  . Seasonal allergies    was on Claritin-stopped taking this pt states it wasn't helping  . Short-term  memory loss   . Tobacco abuse     Patient Active Problem List   Diagnosis Date Noted  . A-fib ([place]) 04/21/2015  . Deviated nasal septum 02/19/2014  . Encounter for therapeutic drug monitoring 06/20/2013  . Personal history of colonic polyps 11/22/2012  . Syncope 10/17/2012  . Acute asthmatic bronchitis 11/22/2011  . Benign neoplasm of colon 10/13/2011  . Unspecified gastritis and gastroduodenitis without mention of hemorrhage 10/13/2011  . GERD (gastroesophageal reflux disease) 10/13/2011  . Obesity (BMI 30-39.9) 09/30/2011  . Dyslipidemia 06/23/2011  . Neuropathy ([place]) 06/23/2011  . Fatigue 05/13/2011  . Atrial flutter ([place]) 04/18/2011  . Long term (current) use of anticoagulants 11/10/2010  . PAF (paroxysmal atrial fibrillation) ([place])   . Tobacco abuse   . COPD (chronic obstructive pulmonary disease) ([place]) 02/11/2010  . Essential hypertension 01/28/2010  . ATRIAL FIBRILLATION 01/28/2010  . OSA (obstructive sleep apnea) 01/28/2010    Past Surgical History:  Procedure Laterality Date  . ANTERIOR CERVICAL DECOMP/DISCECTOMY FUSION  01/04/2012   Procedure: ANTERIOR CERVICAL DECOMPRESSION/DISCECTOMY FUSION 1 LEVEL/HARDWARE REMOVAL;  Surgeon: Ophelia Charter, MD;  Location: [facility] NEURO ORS;  Service: Neurosurgery;  Laterality: N/A;  explore cervical fusion Cervical six - seven  with removal of codman plate anterior cervical decompression with fusion interbody  prothesis plating and bonegraft  . APPENDECTOMY  2-44yrs ago  . ATRIAL FIBRILLATION ABLATION N/A 09/11/2012   PT DID NOT HAVE AN ATRIAL FIBRILLATION ABLATION IN 2014!  ATRIAL FLUTTER ABLATION ONLY  . ATRIAL FLUTTER ABLATION  09/11/2012   CTI ablation by Dr Rayann Heman  . CARDIAC CATHETERIZATION  2008   no significant CAD  . CARDIOVERSION  05/05/2011   Procedure: CARDIOVERSION;  Surgeon: Lelon Perla, MD;  Location: [facility] ENDOSCOPY;  Service: Cardiovascular;  Laterality: N/A;  . CARDIOVERSION Bilateral 07/26/2012   Procedure:  CARDIOVERSION;  Surgeon: Minus Breeding, MD;  Location: [facility] ENDOSCOPY;  Service: Cardiovascular;  Laterality: Bilateral;  . CARPAL TUNNEL RELEASE  99/2000   bilateral  . COLONOSCOPY WITH PROPOFOL N/A 12/13/2012   Procedure: COLONOSCOPY WITH PROPOFOL;  Surgeon: Milus Banister, MD;  Location: [facility] ENDOSCOPY;  Service: Endoscopy;  Laterality: N/A;  . ELECTROPHYSIOLOGIC STUDY N/A 04/21/2015   Procedure: Atrial Fibrillation Ablation;  Surgeon: Thompson Grayer, MD;  Location: [facility] CV LAB;  Service: Cardiovascular;  Laterality: N/A;  . HERNIA REPAIR    . KNEE SURGERY  6-8yrs ago   left  . Left inguinal hernia repair     as a child  . NASAL SEPTOPLASTY W/ TURBINOPLASTY Bilateral 02/19/2014   Procedure: NASAL SEPTOPLASTY WITH BILATERAL TURBINATE REDUCTION;  Surgeon: Jodi Marble, MD;  Location: [place];  Service: ENT;  Laterality: Bilateral;  . NECK SURGERY  43yrs ago  . right shoulder surgery  4-30yrs ago   cyst removed  . TEE WITHOUT CARDIOVERSION  05/05/2011   Procedure: TRANSESOPHAGEAL ECHOCARDIOGRAM (TEE);  Surgeon: Lelon Perla, MD;  Location: [facility] ENDOSCOPY;  Service: Cardiovascular;  Laterality: N/A;  . TEE WITHOUT CARDIOVERSION N/A 04/21/2015   Procedure: TRANSESOPHAGEAL ECHOCARDIOGRAM (TEE);  Surgeon: Sueanne Margarita, MD;  Location: [facility] ENDOSCOPY;  Service: Cardiovascular;  Laterality: N/A;  . THROAT SURGERY  4-31yrs ago   "thought " it was cancer but came back not  . TONSILLECTOMY         Home Medications    Prior to Admission medications   Medication Sig Start Date End Date Taking? Authorizing Provider  albuterol (PROVENTIL HFA;VENTOLIN HFA) 108 (90 BASE) MCG/ACT inhaler Inhale 2 puffs into the lungs every 6 (six) hours as needed for wheezing or shortness of breath.   Yes Historical Provider, MD  bisoprolol (ZEBETA) 10 MG tablet Take 10 mg by mouth daily.   Yes Historical Provider, MD  Cholecalciferol (VITAMIN D) 2000 UNITS CAPS Take 1 capsule by mouth daily.   Yes Historical  Provider, MD  furosemide (LASIX) 40 MG tablet Take 1 tablet (40 mg total) by mouth daily as needed for fluid. 04/28/15  Yes Burtis Junes, NP  glimepiride (AMARYL) 1 MG tablet Take 1 mg by mouth daily before breakfast.    Yes Historical Provider, MD  HYDROcodone-acetaminophen (NORCO/VICODIN) 5-325 MG per tablet Take 1 tablet by mouth every 6 (six) hours as needed for pain.   Yes Historical Provider, MD  Liraglutide (VICTOZA) 18 MG/3ML SOPN Inject 12 mLs into the skin daily.    Yes Historical Provider, MD  lisinopril (PRINIVIL,ZESTRIL) 40 MG tablet Take 1 tablet (40 mg total) by mouth daily. 08/03/15  Yes Thompson Grayer, MD  metFORMIN (GLUCOPHAGE) 1000 MG tablet Take 1,000 mg by mouth 2 (two) times daily with a meal.   Yes Historical Provider, MD  Multiple Vitamin (MULTIVITAMIN WITH MINERALS) TABS Take 1 tablet by mouth daily.   Yes Historical Provider, MD  omeprazole (PRILOSEC) 20 MG capsule Take  20 mg by mouth 2 (two) times daily.    Yes Historical Provider, MD  potassium chloride (KLOR-CON 10) 10 MEQ tablet Take 10 mEq by mouth daily.    Yes Historical Provider, MD  pravastatin (PRAVACHOL) 80 MG tablet TAKE 1 TABLET BY MOUTH EVERY NIGHT AT BEDTIME 05/12/15  Yes Minus Breeding, MD  warfarin (COUMADIN) 5 MG tablet Take 5-7.5 mg by mouth daily. Take 1 and 1/2 on Sunday mon wed and fridays. Then take whole 5mg  tablet rest of days   Yes Historical Provider, MD  azithromycin (ZITHROMAX) 250 MG tablet Take 1 tablet (250 mg total) by mouth daily. Take first 2 tablets together, then 1 every day until finished. 03/07/16 03/11/16  Merrily Pew, MD  predniSONE (DELTASONE) 20 MG tablet 2 tabs po daily x 4 days 03/07/16   Merrily Pew, MD    Family History Family History  Problem Relation Age of Onset  . Colon cancer Neg Hx   . Anesthesia problems Neg Hx   . Hypotension Neg Hx   . Malignant hyperthermia Neg Hx   . Pseudochol deficiency Neg Hx     Social History Social History  Substance Use Topics  .  Smoking status: Current Every Day Smoker    Packs/day: 0.30    Years: 40.00    Types: Cigarettes  . Smokeless tobacco: Never Used  . Alcohol use 0.0 oz/week     Comment: couple of times a month liquor, he tells me he typically drinks 2-4 drinks most days but hasnt drank anything in 6 weeks     Allergies   Adhesive [tape] and Latex   Review of Systems Review of Systems  HENT: Negative for sore throat.   Respiratory: Positive for cough and wheezing.   Cardiovascular: Negative for chest pain.  Gastrointestinal: Negative for diarrhea.  Neurological: Positive for weakness.  All other systems reviewed and are negative.    Physical Exam Updated Vital Signs BP 117/66   Pulse 77   Temp 98.5 F (36.9 C) (Oral)   Resp 17   Ht 6\' 3"  (1.905 m)   Wt 240 lb (108.9 kg)   SpO2 91%   BMI 30.00 kg/m   Physical Exam  Constitutional: He is oriented to person, place, and time. He appears well-developed and well-nourished.  HENT:  Head: Normocephalic and atraumatic.  Eyes: Conjunctivae and EOM are normal.  Neck: Normal range of motion.  Cardiovascular: Normal rate.   Pulmonary/Chest: Effort normal. No respiratory distress. He has decreased breath sounds. He has wheezes. He has rhonchi.  Abdominal: He exhibits distension. There is no guarding.  Musculoskeletal: Normal range of motion.  Neurological: He is alert and oriented to person, place, and time. No cranial nerve deficit.  Skin: Skin is warm and dry.  Nursing note and vitals reviewed.    ED Treatments / Results  Labs (all labs ordered are listed, but only abnormal results are displayed) Labs Reviewed  CBC WITH DIFFERENTIAL/PLATELET - Abnormal; Notable for the following:       Result Value   WBC 3.8 (*)    Hemoglobin 12.4 (*)    HCT 38.1 (*)    Platelets 104 (*)    All other components within normal limits  COMPREHENSIVE METABOLIC PANEL - Abnormal; Notable for the following:    Glucose, Bld 138 (*)    Total Protein 5.9  (*)    Albumin 3.4 (*)    All other components within normal limits  URINALYSIS, ROUTINE W REFLEX MICROSCOPIC (NOT AT [facility]) -  Abnormal; Notable for the following:    Color, Urine AMBER (*)    Hgb urine dipstick TRACE (*)    Bilirubin Urine SMALL (*)    Protein, ur >300 (*)    All other components within normal limits  PROTIME-INR - Abnormal; Notable for the following:    Prothrombin Time 17.2 (*)    All other components within normal limits  URINE MICROSCOPIC-ADD ON - Abnormal; Notable for the following:    Squamous Epithelial / LPF 0-5 (*)    Bacteria, UA RARE (*)    Casts HYALINE CASTS (*)    All other components within normal limits  I-STAT CHEM 8, ED - Abnormal; Notable for the following:    Chloride 99 (*)    Glucose, Bld 135 (*)    Hemoglobin 12.6 (*)    HCT 37.0 (*)    All other components within normal limits  LACTIC ACID, PLASMA  LIPASE, BLOOD  TROPONIN I  BRAIN NATRIURETIC PEPTIDE    EKG  EKG Interpretation  Date/Time:  Sunday March 06 2016 22:21:09 EDT Ventricular Rate:  74 PR Interval:    QRS Duration: 93 QT Interval:  405 QTC Calculation: 450 R Axis:   60 Text Interpretation:  Sinus rhythm RSR' in V1 or V2, probably normal variant Borderline T abnormalities, lateral leads similar to June of this year Confirmed by [facility] MD, Johnanna Bakke [id number]) on 03/06/2016 10:36:37 PM       Radiology Dg Chest 2 View  Result Date: 03/07/2016 CLINICAL DATA:  Evaluate for mass.  Weak, lightheaded EXAM: CHEST  2 VIEW COMPARISON:  04/28/2015 FINDINGS: Lower cervical spine hardware noted. No acute infiltrate or consolidation. No pleural effusion. Stable borderline cardiomegaly. Pulmonary vascularity within normal limits. No pneumothorax. Atherosclerotic vascular disease is present. IMPRESSION: 1. Borderline cardiomegaly.  No acute infiltrate or edema. 2. CT follow-up may be performed as indicated if continued concern for a lung mass. Electronically Signed   By: Donavan Foil M.D.    On: 03/07/2016 02:13   Ct Abdomen Pelvis W Contrast  Result Date: 03/07/2016 CLINICAL DATA:  60 year old male with lethargy and cold sweats and weight loss. EXAM: CT ABDOMEN AND PELVIS WITH CONTRAST TECHNIQUE: Multidetector CT imaging of the abdomen and pelvis was performed using the standard protocol following bolus administration of intravenous contrast. CONTRAST:  138mL ISOVUE-300 IOPAMIDOL (ISOVUE-300) INJECTION 61% COMPARISON:  CT dated 09/24/2015 and renal ultrasound dated 10/21/2015 FINDINGS: Lower chest: The visualized lung bases are clear. No intra-abdominal free air or free fluid. Hepatobiliary: Diffuse fatty infiltration of the liver. Mild irregularity of the hepatic contour may be related to steatosis or represent early changes of cirrhosis. No intrahepatic biliary ductal dilatation. The gallbladder is unremarkable. Pancreas: Unremarkable. No pancreatic ductal dilatation or surrounding inflammatory changes. Spleen: Splenomegaly measuring 19 cm. Adrenals/Urinary Tract: The adrenal glands appear unremarkable. There multiple bilateral renal hypodense lesions measuring up to 3.1 cm in the interpolar aspect of the right kidney. The larger lesions represent cysts and the smaller lesions are not well characterized on the basis of the CT. There is a 6 mm nonobstructing right renal upper pole calculus. No hydronephrosis. There is no hydronephrosis or nephrolithiasis on the left. There is haziness of the renal parenchyma with perinephric stranding, right greater left. Correlation with urinalysis recommended to exclude UTI. The urinary bladder is partially distended and grossly unremarkable. Stomach/Bowel: There is moderate stool throughout the colon. No evidence of bowel obstruction or active inflammation. Appendectomy. Vascular/Lymphatic: Moderate aortoiliac atherosclerotic disease. The abdominal aorta and IVC  are otherwise unremarkable. No portal venous gas identified. There is no adenopathy. Reproductive:  The prostate and seminal vesicles are grossly unremarkable. Other: None Musculoskeletal: There is degenerative changes of the spine. Bilateral L5 pars defects. L3-L4 disc desiccation with vacuum phenomena. No acute fracture. IMPRESSION: Nonobstructing right renal upper pole calculus. No hydronephrosis. There is bilateral renal and perinephric haziness. Correlation with urinalysis is recommended to evaluate for UTI. Multiple bilateral renal hypodense lesions noted similar to prior CT. The larger lesions represent cysts and the smaller ones are not well characterized. Ultrasound or MRI may provide better characterisation. Fatty liver with evidence of early cirrhosis. Splenomegaly. No bowel obstruction or active inflammation. Electronically Signed   By: Anner Crete M.D.   On: 03/07/2016 01:53    Procedures Procedures (including critical care time)  Medications Ordered in ED Medications  ipratropium (ATROVENT) nebulizer solution 0.5 mg (0.5 mg Nebulization Given 03/06/16 2257)  iopamidol (ISOVUE-300) 61 % injection (100 mLs  Contrast Given 03/06/16 2337)  methylPREDNISolone sodium succinate (SOLU-MEDROL) 125 mg/2 mL injection 125 mg (125 mg Intravenous Given 03/07/16 0112)  azithromycin (ZITHROMAX) 500 mg in dextrose 5 % 250 mL IVPB (0 mg Intravenous Stopped 03/07/16 0234)     Initial Impression / Assessment and Plan / ED Course  I have reviewed the triage vital signs and the nursing notes.  Pertinent labs & imaging results that were available during my care of the patient were reviewed by me and considered in my medical decision making (see chart for details).  Clinical Course    Likely copd, however has worsening DOE so will get bnp. Also with abdominal distension and mild tenderness so will ct to ensure no malignancy.  Continuous/steroids/antibiotics and reassessment. Multiple reevaluations, significantly improved. Awaiting ct scan results.   Final Clinical Impressions(s) / ED  Diagnoses   Final diagnoses:  COPD exacerbation ([place])    New Prescriptions Discharge Medication List as of 03/07/2016  1:15 AM    START taking these medications   Details  azithromycin (ZITHROMAX) 250 MG tablet Take 1 tablet (250 mg total) by mouth daily. Take first 2 tablets together, then 1 every day until finished., Starting Mon 03/07/2016, Until Fri 03/11/2016, Print    predniSONE (DELTASONE) 20 MG tablet 2 tabs po daily x 4 days, Print         Merrily Pew, MD 03/07/16 1353

## 2016-03-06 NOTE — ED Triage Notes (Signed)
From home called EMs for generalized and becomes SOB with any movement 78 SR Sat +93% on RA abdomen distended pt has a history of CHF pt states that he has lost 6 lbs in the past two days L/S min wheezing 18G R AC CBG 150

## 2016-03-06 NOTE — ED Notes (Signed)
RT at bedside pt on monitor appears comfortable

## 2016-03-07 ENCOUNTER — Emergency Department (HOSPITAL_COMMUNITY): Payer: BLUE CROSS/BLUE SHIELD

## 2016-03-07 LAB — LACTIC ACID, PLASMA: LACTIC ACID, VENOUS: 1.7 mmol/L (ref 0.5–1.9)

## 2016-03-07 LAB — COMPREHENSIVE METABOLIC PANEL
ALK PHOS: 85 U/L (ref 38–126)
ALT: 31 U/L (ref 17–63)
ANION GAP: 8 (ref 5–15)
AST: 40 U/L (ref 15–41)
Albumin: 3.4 g/dL — ABNORMAL LOW (ref 3.5–5.0)
BILIRUBIN TOTAL: 0.8 mg/dL (ref 0.3–1.2)
BUN: 12 mg/dL (ref 6–20)
CALCIUM: 9 mg/dL (ref 8.9–10.3)
CO2: 27 mmol/L (ref 22–32)
Chloride: 101 mmol/L (ref 101–111)
Creatinine, Ser: 0.83 mg/dL (ref 0.61–1.24)
GFR calc Af Amer: >60 mL/min (ref >60)
GLUCOSE: 138 mg/dL — AB (ref 65–99)
POTASSIUM: 3.9 mmol/L (ref 3.5–5.1)
Sodium: 136 mmol/L (ref 135–145)
TOTAL PROTEIN: 5.9 g/dL — AB (ref 6.5–8.1)

## 2016-03-07 LAB — CBC WITH DIFFERENTIAL/PLATELET
BASOS PCT: 0 %
Basophils Absolute: 0 10*3/uL (ref 0.0–0.1)
Eosinophils Absolute: 0 10*3/uL (ref 0.0–0.7)
Eosinophils Relative: 1 %
HEMATOCRIT: 38.1 % — AB (ref 39.0–52.0)
Hemoglobin: 12.4 g/dL — ABNORMAL LOW (ref 13.0–17.0)
LYMPHS ABS: 1.1 10*3/uL (ref 0.7–4.0)
Lymphocytes Relative: 30 %
MCH: 29 pg (ref 26.0–34.0)
MCHC: 32.5 g/dL (ref 30.0–36.0)
MCV: 89.2 fL (ref 78.0–100.0)
MONO ABS: 0.6 10*3/uL (ref 0.1–1.0)
MONOS PCT: 16 %
NEUTROS ABS: 2 10*3/uL (ref 1.7–7.7)
Neutrophils Relative %: 53 %
Platelets: 104 10*3/uL — ABNORMAL LOW (ref 150–400)
RBC: 4.27 MIL/uL (ref 4.22–5.81)
RDW: 14.9 % (ref 11.5–15.5)
WBC: 3.8 10*3/uL — ABNORMAL LOW (ref 4.0–10.5)

## 2016-03-07 LAB — URINE MICROSCOPIC-ADD ON

## 2016-03-07 LAB — URINALYSIS, ROUTINE W REFLEX MICROSCOPIC
Glucose, UA: NEGATIVE mg/dL
Ketones, ur: NEGATIVE mg/dL
Leukocytes, UA: NEGATIVE
NITRITE: NEGATIVE
SPECIFIC GRAVITY, URINE: 1.025 (ref 1.005–1.030)
pH: 5.5 (ref 5.0–8.0)

## 2016-03-07 LAB — LIPASE, BLOOD: LIPASE: 32 U/L (ref 11–51)

## 2016-03-07 LAB — TROPONIN I

## 2016-03-07 LAB — BRAIN NATRIURETIC PEPTIDE: B NATRIURETIC PEPTIDE 5: 46.1 pg/mL (ref 0.0–100.0)

## 2016-03-07 MED ORDER — AZITHROMYCIN 500 MG IV SOLR
500.0000 mg | Freq: Once | INTRAVENOUS | Status: AC
  Administered 2016-03-07: 500 mg via INTRAVENOUS
  Filled 2016-03-07: qty 500

## 2016-03-07 MED ORDER — AZITHROMYCIN 250 MG PO TABS: 250.0000 mg | ORAL_TABLET | Freq: Every day | ORAL | 0 refills | Status: AC

## 2016-03-07 MED ORDER — METHYLPREDNISOLONE SODIUM SUCC 125 MG IJ SOLR
125.0000 mg | Freq: Once | INTRAMUSCULAR | Status: AC
  Administered 2016-03-07: 125 mg via INTRAVENOUS
  Filled 2016-03-07: qty 2

## 2016-03-07 MED ORDER — PREDNISONE 20 MG PO TABS: ORAL_TABLET | ORAL | 0 refills | Status: DC

## 2016-03-07 NOTE — ED Provider Notes (Signed)
Patient initially seen and evaluated by Dr. Dayna Barker, signed out to me pending results of chest x-ray and CT scan. Both come back showing no acute process. Incidental finding of bilateral renal cysts, and right upper pole renal calculus, and findings consistent with cirrhosis. Patient states he has quit drinking as of about 3 years ago. He was reassured regarding the radiology findings and discharged.  Results for orders placed or performed during the hospital encounter of 03/06/16  CBC with Differential  Result Value Ref Range   WBC 3.8 (L) 4.0 - 10.5 K/uL   RBC 4.27 4.22 - 5.81 MIL/uL   Hemoglobin 12.4 (L) 13.0 - 17.0 g/dL   HCT 38.1 (L) 39.0 - 52.0 %   MCV 89.2 78.0 - 100.0 fL   MCH 29.0 26.0 - 34.0 pg   MCHC 32.5 30.0 - 36.0 g/dL   RDW 14.9 11.5 - 15.5 %   Platelets 104 (L) 150 - 400 K/uL   Neutrophils Relative % 53 %   Neutro Abs 2.0 1.7 - 7.7 K/uL   Lymphocytes Relative 30 %   Lymphs Abs 1.1 0.7 - 4.0 K/uL   Monocytes Relative 16 %   Monocytes Absolute 0.6 0.1 - 1.0 K/uL   Eosinophils Relative 1 %   Eosinophils Absolute 0.0 0.0 - 0.7 K/uL   Basophils Relative 0 %   Basophils Absolute 0.0 0.0 - 0.1 K/uL  Comprehensive metabolic panel  Result Value Ref Range   Sodium 136 135 - 145 mmol/L   Potassium 3.9 3.5 - 5.1 mmol/L   Chloride 101 101 - 111 mmol/L   CO2 27 22 - 32 mmol/L   Glucose, Bld 138 (H) 65 - 99 mg/dL   BUN 12 6 - 20 mg/dL   Creatinine, Ser 0.83 0.61 - 1.24 mg/dL   Calcium 9.0 8.9 - 10.3 mg/dL   Total Protein 5.9 (L) 6.5 - 8.1 g/dL   Albumin 3.4 (L) 3.5 - 5.0 g/dL   AST 40 15 - 41 U/L   ALT 31 17 - 63 U/L   Alkaline Phosphatase 85 38 - 126 U/L   Total Bilirubin 0.8 0.3 - 1.2 mg/dL   GFR calc non Af Amer >60 >60 mL/min   GFR calc Af Amer >60 >60 mL/min   Anion gap 8 5 - 15  Lactic acid, plasma  Result Value Ref Range   Lactic Acid, Venous 1.7 0.5 - 1.9 mmol/L  Lipase, blood  Result Value Ref Range   Lipase 32 11 - 51 U/L  Troponin I  Result Value Ref Range    Troponin I <0.03 <0.03 ng/mL  Brain natriuretic peptide  Result Value Ref Range   B Natriuretic Peptide 46.1 0.0 - 100.0 pg/mL  Protime-INR  Result Value Ref Range   Prothrombin Time 17.2 (H) 11.4 - 15.2 seconds   INR 1.39   I-stat chem 8, ed  Result Value Ref Range   Sodium 139 135 - 145 mmol/L   Potassium 4.0 3.5 - 5.1 mmol/L   Chloride 99 (L) 101 - 111 mmol/L   BUN 14 6 - 20 mg/dL   Creatinine, Ser 0.90 0.61 - 1.24 mg/dL   Glucose, Bld 135 (H) 65 - 99 mg/dL   Calcium, Ion 1.15 1.15 - 1.40 mmol/L   TCO2 29 0 - 100 mmol/L   Hemoglobin 12.6 (L) 13.0 - 17.0 g/dL   HCT 37.0 (L) 39.0 - 52.0 %   Dg Chest 2 View  Result Date: 03/07/2016 CLINICAL DATA:  Evaluate for mass.  Weak, lightheaded EXAM: CHEST  2 VIEW COMPARISON:  04/28/2015 FINDINGS: Lower cervical spine hardware noted. No acute infiltrate or consolidation. No pleural effusion. Stable borderline cardiomegaly. Pulmonary vascularity within normal limits. No pneumothorax. Atherosclerotic vascular disease is present. IMPRESSION: 1. Borderline cardiomegaly.  No acute infiltrate or edema. 2. CT follow-up may be performed as indicated if continued concern for a lung mass. Electronically Signed   By: Donavan Foil M.D.   On: 03/07/2016 02:13   Ct Abdomen Pelvis W Contrast  Result Date: 03/07/2016 CLINICAL DATA:  60 year old male with lethargy and cold sweats and weight loss. EXAM: CT ABDOMEN AND PELVIS WITH CONTRAST TECHNIQUE: Multidetector CT imaging of the abdomen and pelvis was performed using the standard protocol following bolus administration of intravenous contrast. CONTRAST:  172mL ISOVUE-300 IOPAMIDOL (ISOVUE-300) INJECTION 61% COMPARISON:  CT dated 09/24/2015 and renal ultrasound dated 10/21/2015 FINDINGS: Lower chest: The visualized lung bases are clear. No intra-abdominal free air or free fluid. Hepatobiliary: Diffuse fatty infiltration of the liver. Mild irregularity of the hepatic contour may be related to steatosis or  represent early changes of cirrhosis. No intrahepatic biliary ductal dilatation. The gallbladder is unremarkable. Pancreas: Unremarkable. No pancreatic ductal dilatation or surrounding inflammatory changes. Spleen: Splenomegaly measuring 19 cm. Adrenals/Urinary Tract: The adrenal glands appear unremarkable. There multiple bilateral renal hypodense lesions measuring up to 3.1 cm in the interpolar aspect of the right kidney. The larger lesions represent cysts and the smaller lesions are not well characterized on the basis of the CT. There is a 6 mm nonobstructing right renal upper pole calculus. No hydronephrosis. There is no hydronephrosis or nephrolithiasis on the left. There is haziness of the renal parenchyma with perinephric stranding, right greater left. Correlation with urinalysis recommended to exclude UTI. The urinary bladder is partially distended and grossly unremarkable. Stomach/Bowel: There is moderate stool throughout the colon. No evidence of bowel obstruction or active inflammation. Appendectomy. Vascular/Lymphatic: Moderate aortoiliac atherosclerotic disease. The abdominal aorta and IVC are otherwise unremarkable. No portal venous gas identified. There is no adenopathy. Reproductive: The prostate and seminal vesicles are grossly unremarkable. Other: None Musculoskeletal: There is degenerative changes of the spine. Bilateral L5 pars defects. L3-L4 disc desiccation with vacuum phenomena. No acute fracture. IMPRESSION: Nonobstructing right renal upper pole calculus. No hydronephrosis. There is bilateral renal and perinephric haziness. Correlation with urinalysis is recommended to evaluate for UTI. Multiple bilateral renal hypodense lesions noted similar to prior CT. The larger lesions represent cysts and the smaller ones are not well characterized. Ultrasound or MRI may provide better characterisation. Fatty liver with evidence of early cirrhosis. Splenomegaly. No bowel obstruction or active  inflammation. Electronically Signed   By: Anner Crete M.D.   On: 03/07/2016 [id number]      Delora Fuel, MD [id number]

## 2016-03-07 NOTE — ED Notes (Signed)
MD at bedside. 

## 2016-03-08 MED FILL — Albuterol Sulfate Soln Nebu 0.5% (5 MG/ML): RESPIRATORY_TRACT | Qty: 20 | Status: AC

## 2016-03-14 ENCOUNTER — Other Ambulatory Visit: Payer: Self-pay | Admitting: Cardiology

## 2016-03-30 ENCOUNTER — Encounter (INDEPENDENT_AMBULATORY_CARE_PROVIDER_SITE_OTHER): Payer: Self-pay

## 2016-03-30 ENCOUNTER — Ambulatory Visit (INDEPENDENT_AMBULATORY_CARE_PROVIDER_SITE_OTHER): Payer: BLUE CROSS/BLUE SHIELD | Admitting: *Deleted

## 2016-03-30 DIAGNOSIS — I480 Paroxysmal atrial fibrillation: Secondary | ICD-10-CM

## 2016-03-30 DIAGNOSIS — Z5181 Encounter for therapeutic drug level monitoring: Secondary | ICD-10-CM | POA: Diagnosis not present

## 2016-03-30 LAB — POCT INR: INR: 2.4

## 2016-04-06 ENCOUNTER — Ambulatory Visit (INDEPENDENT_AMBULATORY_CARE_PROVIDER_SITE_OTHER): Payer: BLUE CROSS/BLUE SHIELD | Admitting: Pharmacist

## 2016-04-06 DIAGNOSIS — Z5181 Encounter for therapeutic drug level monitoring: Secondary | ICD-10-CM

## 2016-04-06 DIAGNOSIS — I480 Paroxysmal atrial fibrillation: Secondary | ICD-10-CM | POA: Diagnosis not present

## 2016-04-06 LAB — POCT INR: INR: 2.9

## 2016-04-21 ENCOUNTER — Encounter: Payer: Self-pay | Admitting: Cardiology

## 2016-04-25 NOTE — Progress Notes (Signed)
HPI The patient presents for followup of atrial flutter and atrial fibrillation.  He is status post flutter ablation.  He was not a candidate for amio or flecainide.  I sent him to the atrial fib clinic. He was on Multaq for awhile but now has been taken off of that because his atrial fib burden was low.  He returns for follow up.  He has very rare palpitations. She feels pretty well. He does manual activities and denies any symptoms. His had no chest pressure, neck or arm discomfort. He doesn't report any shortness of breath, PND or orthopnea.   Allergies  Allergen Reactions  . Adhesive [Tape]     itching  . Latex Itching    When tape is on the skin too long skin gets red & itching    Current Outpatient Prescriptions  Medication Sig Dispense Refill  . albuterol (PROVENTIL HFA;VENTOLIN HFA) 108 (90 BASE) MCG/ACT inhaler Inhale 2 puffs into the lungs every 6 (six) hours as needed for wheezing or shortness of breath.    [name] azithromycin (ZITHROMAX) 250 MG tablet Take as directed    . bisoprolol (ZEBETA) 10 MG tablet Take 10 mg by mouth daily.    . Cholecalciferol (VITAMIN D) 2000 UNITS CAPS Take 1 capsule by mouth daily.    . furosemide (LASIX) 40 MG tablet Take 1 tablet (40 mg total) by mouth daily as needed for fluid. 30 tablet 2  . glimepiride (AMARYL) 1 MG tablet Take 1 mg by mouth daily before breakfast.     . HYDROcodone-acetaminophen (NORCO/VICODIN) 5-325 MG per tablet Take 1 tablet by mouth every 6 (six) hours as needed for pain.    . Liraglutide (VICTOZA) 18 MG/3ML SOPN Inject 12 mLs into the skin daily.     [name] lisinopril (PRINIVIL,ZESTRIL) 40 MG tablet Take 1 tablet (40 mg total) by mouth daily. 90 tablet 3  . metFORMIN (GLUCOPHAGE) 1000 MG tablet Take 1,000 mg by mouth 2 (two) times daily with a meal.    . Multiple Vitamin (MULTIVITAMIN WITH MINERALS) TABS Take 1 tablet by mouth daily.    [name] omeprazole (PRILOSEC) 20 MG capsule Take 20 mg by mouth 2 (two) times daily.     .  potassium chloride (KLOR-CON 10) 10 MEQ tablet Take 10 mEq by mouth daily.     . pravastatin (PRAVACHOL) 80 MG tablet TAKE 1 TABLET BY MOUTH EVERY NIGHT AT BEDTIME 30 tablet 11  . predniSONE (DELTASONE) 20 MG tablet 2 tabs po daily x 4 days 8 tablet 0  . warfarin (COUMADIN) 5 MG tablet Take 5-7.5 mg by mouth daily. Take 1 and 1/2 on Sunday mon wed and fridays. Then take whole 5mg  tablet rest of days    . warfarin (COUMADIN) 5 MG tablet TAKE 1 TO 1 AND 1/2 TABLETS BY MOUTH DAILY AS DIRECTED 45 tablet 3   No current facility-administered medications for this visit.     Past Medical History:  Diagnosis Date  . Arthritis    lower back  . Asthma   . Atrial flutter ([place])    s/p CTI ablation by Dr Rayann Heman  . Brain aneurysm 2009   questionable. A follow up CTA in 2009 showed no evidence of  . Chronic back pain    DDD/stenosis  . Colon polyps    9 polyps removed 10/13/11  . Complication of anesthesia September 11, 2012   slow to awaken after ablation  . Diabetes mellitus    takes Metformin and Glimepiride daily  .  Dyspnea    LHC 11/08: EF 60%, normal coronary arteries;  ETT-echo 2/11: normal at 71% PMHR  . Emphysema   . GERD (gastroesophageal reflux disease)    takes Omeprazole bid  . History of shingles   . HLD (hyperlipidemia)    takes Pravastatin daily  . HTN (hypertension)    takes Prinizide daily  . Obesity   . OSA (obstructive sleep apnea)    not always using cpap  . Overdose 2009   unintentional Flecanide overdose  . Peripheral neuropathy ([place])   . Pneumonia 3-4 yrs ago   none recent  . Seasonal allergies    was on Claritin-stopped taking this pt states it wasn't helping  . Short-term memory loss   . Tobacco abuse     Past Surgical History:  Procedure Laterality Date  . ANTERIOR CERVICAL DECOMP/DISCECTOMY FUSION  01/04/2012   Procedure: ANTERIOR CERVICAL DECOMPRESSION/DISCECTOMY FUSION 1 LEVEL/HARDWARE REMOVAL;  Surgeon: Ophelia Charter, MD;  Location: [place] NEURO ORS;   Service: Neurosurgery;  Laterality: N/A;  explore cervical fusion Cervical six - seven  with removal of codman plate anterior cervical decompression with fusion interbody prothesis plating and bonegraft  . APPENDECTOMY  2-56yrs ago  . ATRIAL FIBRILLATION ABLATION N/A 09/11/2012   PT DID NOT HAVE AN ATRIAL FIBRILLATION ABLATION IN 2014!  ATRIAL FLUTTER ABLATION ONLY  . ATRIAL FLUTTER ABLATION  09/11/2012   CTI ablation by Dr Rayann Heman  . CARDIAC CATHETERIZATION  2008   no significant CAD  . CARDIOVERSION  05/05/2011   Procedure: CARDIOVERSION;  Surgeon: Lelon Perla, MD;  Location[facility] ENDOSCOPY;  Service: Cardiovascular;  Laterality: N/A;  . CARDIOVERSION Bilateral 07/26/2012   Procedure: CARDIOVERSION;  Surgeon: Minus Breeding, MD;  Location: [facility] ENDOSCOPY;  Service: Cardiovascular;  Laterality: Bilateral;  . CARPAL TUNNEL RELEASE  99/2000   bilateral  . COLONOSCOPY WITH PROPOFOL N/A 12/13/2012   Procedure: COLONOSCOPY WITH PROPOFOL;  Surgeon: Milus Banister, MD;  Location: [facility] ENDOSCOPY;  Service: Endoscopy;  Laterality: N/A;  . ELECTROPHYSIOLOGIC STUDY N/A 04/21/2015   Procedure: Atrial Fibrillation Ablation;  Surgeon: Thompson Grayer, MD;  Location: [place] CV LAB;  Service: Cardiovascular;  Laterality: N/A;  . HERNIA REPAIR    . KNEE SURGERY  6-13yrs ago   left  . Left inguinal hernia repair     as a child  . NASAL SEPTOPLASTY W/ TURBINOPLASTY Bilateral 02/19/2014   Procedure: NASAL SEPTOPLASTY WITH BILATERAL TURBINATE REDUCTION;  Surgeon: Jodi Marble, MD;  Location: [place];  Service: ENT;  Laterality: Bilateral;  . NECK SURGERY  34yrs ago  . right shoulder surgery  4-24yrs ago   cyst removed  . TEE WITHOUT CARDIOVERSION  05/05/2011   Procedure: TRANSESOPHAGEAL ECHOCARDIOGRAM (TEE);  Surgeon: Lelon Perla, MD;  Location: [facility] ENDOSCOPY;  Service: Cardiovascular;  Laterality: N/A;  . TEE WITHOUT CARDIOVERSION N/A 04/21/2015   Procedure: TRANSESOPHAGEAL ECHOCARDIOGRAM (TEE);  Surgeon:  Sueanne Margarita, MD;  Location: [facility] ENDOSCOPY;  Service: Cardiovascular;  Laterality: N/A;  . THROAT SURGERY  4-45yrs ago   "thought " it was cancer but came back not  . TONSILLECTOMY      ROS:  Nocturia, peripheral neuropath and back pain.     Otherwise as stated in the HPI and negative for all other systems.  PHYSICAL EXAM BP (!) 152/90   Pulse 62   Ht 6\' 3"  (1.905 m)   Wt 250 lb 9.6 oz (113.7 kg)   BMI 31.32 kg/m  GENERAL:  Well appearing HEENT:  Pupils  equal round and reactive, fundi not visualized, oral mucosa , edentulous NECK:  No jugular venous distention, waveform within normal limits, carotid upstroke brisk and symmetric, no bruits, no thyromegaly LUNGS:  Clear to auscultation bilaterally, diminished BS with scattered wheezes. BACK:  No CVA tenderness CHEST:  Unremarkable HEART:  PMI not displaced or sustained,S1 and S2 within normal limits, no S3, no S4, no clicks, no rubs, no murmurs, regular ABD:  Flat, positive bowel sounds normal in frequency in pitch, no bruits, no rebound, no guarding, no midline pulsatile mass, no hepatomegaly, no splenomegaly EXT:  2 plus pulses throughout, no edema, no cyanosis no clubbing   EKG:  Sinus rhythm, rate 74, axis within normal limits, intervals within normal T wave changes. No change from previous.  03/06/16  ASSESSMENT AND PLAN  ATRIAL FLUTTER/FIB:   [name] has a CHA2DS2 - VASc score of 2.    HTN:   The blood pressure is at elevated but he says it wasn't elevated in his doctor's office today.   He is going to get a new blood pressure cuff and keep a blood pressure diary.  TOBACCO ABUSE:  He talked about this today and he is committed to quitting smoking  ALCOHOL USE:  Last drink Dec of 2015

## 2016-04-26 ENCOUNTER — Encounter: Payer: Self-pay | Admitting: Cardiology

## 2016-04-26 ENCOUNTER — Ambulatory Visit (INDEPENDENT_AMBULATORY_CARE_PROVIDER_SITE_OTHER): Payer: BLUE CROSS/BLUE SHIELD | Admitting: Cardiology

## 2016-04-26 VITALS — BP 152/90 | HR 62 | Ht 75.0 in | Wt 250.6 lb

## 2016-04-26 DIAGNOSIS — I480 Paroxysmal atrial fibrillation: Secondary | ICD-10-CM | POA: Diagnosis not present

## 2016-04-26 NOTE — Patient Instructions (Signed)

## 2016-04-27 ENCOUNTER — Encounter: Payer: Self-pay | Admitting: Cardiology

## 2016-05-11 ENCOUNTER — Other Ambulatory Visit: Payer: Self-pay | Admitting: Cardiology

## 2016-05-18 ENCOUNTER — Ambulatory Visit (INDEPENDENT_AMBULATORY_CARE_PROVIDER_SITE_OTHER): Payer: BLUE CROSS/BLUE SHIELD | Admitting: *Deleted

## 2016-05-18 DIAGNOSIS — Z5181 Encounter for therapeutic drug level monitoring: Secondary | ICD-10-CM | POA: Diagnosis not present

## 2016-05-18 DIAGNOSIS — I480 Paroxysmal atrial fibrillation: Secondary | ICD-10-CM | POA: Diagnosis not present

## 2016-05-18 LAB — POCT INR: INR: 2.3

## 2016-06-29 ENCOUNTER — Encounter: Payer: Self-pay | Admitting: Cardiology

## 2016-06-29 ENCOUNTER — Ambulatory Visit (INDEPENDENT_AMBULATORY_CARE_PROVIDER_SITE_OTHER): Payer: BLUE CROSS/BLUE SHIELD | Admitting: *Deleted

## 2016-06-29 DIAGNOSIS — I480 Paroxysmal atrial fibrillation: Secondary | ICD-10-CM | POA: Diagnosis not present

## 2016-06-29 DIAGNOSIS — I4891 Unspecified atrial fibrillation: Secondary | ICD-10-CM | POA: Diagnosis not present

## 2016-06-29 DIAGNOSIS — Z5181 Encounter for therapeutic drug level monitoring: Secondary | ICD-10-CM

## 2016-06-29 LAB — POCT INR: INR: 2.8

## 2016-06-29 NOTE — Telephone Encounter (Signed)
This encounter was created in error - please disregard.

## 2016-07-25 ENCOUNTER — Ambulatory Visit (INDEPENDENT_AMBULATORY_CARE_PROVIDER_SITE_OTHER): Payer: BLUE CROSS/BLUE SHIELD

## 2016-07-25 DIAGNOSIS — Z5181 Encounter for therapeutic drug level monitoring: Secondary | ICD-10-CM | POA: Diagnosis not present

## 2016-07-25 DIAGNOSIS — I4891 Unspecified atrial fibrillation: Secondary | ICD-10-CM

## 2016-07-25 DIAGNOSIS — I480 Paroxysmal atrial fibrillation: Secondary | ICD-10-CM

## 2016-07-25 LAB — POCT INR: INR: 2.8

## 2016-07-29 ENCOUNTER — Other Ambulatory Visit: Payer: Self-pay | Admitting: Cardiology

## 2016-08-25 ENCOUNTER — Other Ambulatory Visit: Payer: Self-pay | Admitting: Internal Medicine

## 2016-08-25 ENCOUNTER — Encounter: Payer: Self-pay | Admitting: Cardiology

## 2016-08-25 ENCOUNTER — Telehealth: Payer: Self-pay | Admitting: Cardiology

## 2016-08-25 NOTE — Telephone Encounter (Signed)
Returned call to patient he stated he had severe chest pain radiating into back,down left shoulder,sob,weakness this past Mon 08/22/16 night around 9:30 pm that most of the night.Stated he saw PCP Dr.Little on Tues 4/10 EKG was done,was told he might have a blockage.Stated he still has chest pain off and on,but not as bad as Mon night.No chest pain at present.Stated he made appointment to see Dr.Hochrein tomorrow 4/13 at 11:00 am.Advised if chest pain returns he needs to go to [facility] ED. Called PCP Dr.James Little's office.Office closed left message to fax EKG done on Tue 4/10 to Dr.Hochrein at fax # [phone].

## 2016-08-25 NOTE — Progress Notes (Signed)
HPI The patient presents for followup of atrial flutter and atrial fibrillation.  He is status post flutter ablation.  He was not a candidate for amio or flecainide.  I sent him to the atrial fib clinic. He was on Multaq for awhile but now has been taken off of that because his atrial fib burden was low.  He called yesterday with chest pain.  He had normal coronaries on cath in 2008.  Perfusion study was negative in 2014.      He report that his chest pain was on Monday.  He was opening the car door for his wife.  He had a severe dull pain under his left breast.  It lasted for a couple of hours but he did not call 911 or [place].  He had pain off and on again on Tuesday and Wed.  He now has increased fatigue and SOB with any activity and is having some exertional chest pain.  He does not describe other associated symptoms such as nausea, vomiting or diaphoresis.  He has not had palpitations, presyncope or syncope.  He does not think that he has had this discomfort before.  He did see [name], MD on Tuesday but I don't have these records and their office is closed.      Allergies  Allergen Reactions  . Adhesive [Tape]     itching  . Latex Itching    When tape is on the skin too long skin gets red & itching    Current Outpatient Prescriptions  Medication Sig Dispense Refill  . albuterol (PROVENTIL HFA;VENTOLIN HFA) 108 (90 BASE) MCG/ACT inhaler Inhale 2 puffs into the lungs every 6 (six) hours as needed for wheezing or shortness of breath.    . bisoprolol (ZEBETA) 10 MG tablet Take 10 mg by mouth daily.    . Cholecalciferol (VITAMIN D) 2000 UNITS CAPS Take 1 capsule by mouth daily.    [name] diltiazem (CARDIZEM CD) 120 MG 24 hr capsule Take 120 mg by mouth daily.    [name] diltiazem (CARDIZEM) 60 MG tablet Take 60 mg by mouth daily as needed (ATRIAL FIB).    . furosemide (LASIX) 40 MG tablet Take 1 tablet (40 mg total) by mouth daily as needed for fluid. 30 tablet 2  . glimepiride (AMARYL) 1 MG tablet  Take 1 mg by mouth daily before breakfast.     . HYDROcodone-acetaminophen (NORCO/VICODIN) 5-325 MG per tablet Take 1 tablet by mouth every 6 (six) hours as needed for pain.    . Liraglutide (VICTOZA) 18 MG/3ML SOPN Inject 12 mLs into the skin daily.     [name] lisinopril (PRINIVIL,ZESTRIL) 40 MG tablet Take 1 tablet (40 mg total) by mouth daily. 90 tablet 3  . metFORMIN (GLUCOPHAGE) 1000 MG tablet Take 1,000 mg by mouth 2 (two) times daily with a meal.    . Multiple Vitamin (MULTIVITAMIN WITH MINERALS) TABS Take 1 tablet by mouth daily.    [name] omeprazole (PRILOSEC) 20 MG capsule Take 20 mg by mouth 2 (two) times daily.     . potassium chloride (KLOR-CON 10) 10 MEQ tablet Take 10 mEq by mouth daily.     . pravastatin (PRAVACHOL) 80 MG tablet TAKE 1 TABLET BY MOUTH EVERY NIGHT AT BEDTIME 30 tablet 11  . warfarin (COUMADIN) 5 MG tablet Take 1 to 1 and 1/2 tablets every day as directed by coumadin clinic 40 tablet 2   No current facility-administered medications for this visit.     Past Medical History:  Diagnosis Date  . Arthritis    lower back  . Asthma   . Atrial flutter ([place])    s/p CTI ablation by Dr Rayann Heman  . Brain aneurysm 2009   questionable. A follow up CTA in 2009 showed no evidence of  . Chronic back pain    DDD/stenosis  . Colon polyps    9 polyps removed 10/13/11  . Complication of anesthesia September 11, 2012   slow to awaken after ablation  . Diabetes mellitus    takes Metformin and Glimepiride daily  . Dyspnea    LHC 11/08: EF 60%, normal coronary arteries;  ETT-echo 2/11: normal at 71% PMHR  . Emphysema   . GERD (gastroesophageal reflux disease)    takes Omeprazole bid  . History of shingles   . HLD (hyperlipidemia)    takes Pravastatin daily  . HTN (hypertension)    takes Prinizide daily  . Obesity   . OSA (obstructive sleep apnea)    not always using cpap  . Overdose 2009   unintentional Flecanide overdose  . Peripheral neuropathy ([place])   . Short-term memory loss    . Tobacco abuse     Past Surgical History:  Procedure Laterality Date  . ANTERIOR CERVICAL DECOMP/DISCECTOMY FUSION  01/04/2012   Procedure: ANTERIOR CERVICAL DECOMPRESSION/DISCECTOMY FUSION 1 LEVEL/HARDWARE REMOVAL;  Surgeon: Ophelia Charter, MD;  Location: [place] NEURO ORS;  Service: Neurosurgery;  Laterality: N/A;  explore cervical fusion Cervical six - seven  with removal of codman plate anterior cervical decompression with fusion interbody prothesis plating and bonegraft  . APPENDECTOMY  2-39yrs ago  . ATRIAL FIBRILLATION ABLATION N/A 09/11/2012   PT DID NOT HAVE AN ATRIAL FIBRILLATION ABLATION IN 2014!  ATRIAL FLUTTER ABLATION ONLY  . ATRIAL FLUTTER ABLATION  09/11/2012   CTI ablation by Dr Rayann Heman  . CARDIAC CATHETERIZATION  2008   no significant CAD  . CARDIOVERSION  05/05/2011   Procedure: CARDIOVERSION;  Surgeon: Lelon Perla, MD;  Location: [facility] ENDOSCOPY;  Service: Cardiovascular;  Laterality: N/A;  . CARDIOVERSION Bilateral 07/26/2012   Procedure: CARDIOVERSION;  Surgeon: Minus Breeding, MD;  Location: [facility] ENDOSCOPY;  Service: Cardiovascular;  Laterality: Bilateral;  . CARPAL TUNNEL RELEASE  99/2000   bilateral  . COLONOSCOPY WITH PROPOFOL N/A 12/13/2012   Procedure: COLONOSCOPY WITH PROPOFOL;  Surgeon: Milus Banister, MD;  Location: [facility] ENDOSCOPY;  Service: Endoscopy;  Laterality: N/A;  . ELECTROPHYSIOLOGIC STUDY N/A 04/21/2015   Procedure: Atrial Fibrillation Ablation;  Surgeon: Thompson Grayer, MD;  Location: [place] CV LAB;  Service: Cardiovascular;  Laterality: N/A;  . HERNIA REPAIR    . KNEE SURGERY  6-79yrs ago   left  . Left inguinal hernia repair     as a child  . NASAL SEPTOPLASTY W/ TURBINOPLASTY Bilateral 02/19/2014   Procedure: NASAL SEPTOPLASTY WITH BILATERAL TURBINATE REDUCTION;  Surgeon: Jodi Marble, MD;  Location: [place];  Service: ENT;  Laterality: Bilateral;  . NECK SURGERY  29yrs ago  . right shoulder surgery  4-36yrs ago   cyst removed  . TEE WITHOUT  CARDIOVERSION  05/05/2011   Procedure: TRANSESOPHAGEAL ECHOCARDIOGRAM (TEE);  Surgeon: Lelon Perla, MD;  Location: [facility] ENDOSCOPY;  Service: Cardiovascular;  Laterality: N/A;  . TEE WITHOUT CARDIOVERSION N/A 04/21/2015   Procedure: TRANSESOPHAGEAL ECHOCARDIOGRAM (TEE);  Surgeon: Sueanne Margarita, MD;  Location: [facility] ENDOSCOPY;  Service: Cardiovascular;  Laterality: N/A;  . THROAT SURGERY  4-48yrs ago   "thought " it was cancer but came back not  . TONSILLECTOMY  Social History   Social History  . Marital status: Married    Spouse name: N/A  . Number of children: 2  . Years of education: N/A   Occupational History  . Mechanic     Social History Main Topics  . Smoking status: Current Every Day Smoker    Packs/day: 0.30    Years: 40.00    Types: Cigarettes  . Smokeless tobacco: Never Used  . Alcohol use 0.0 oz/week     Comment: couple of times a month liquor, he tells me he typically drinks 2-4 drinks most days but hasnt drank anything in 6 weeks  . Drug use: No  . Sexual activity: Yes   Other Topics Concern  . Not on file   Social History Narrative   Daily caffeine(Mountain Dew)       Lives in [place] with spouse.   Unemployed due to chronic back/ leg pain         Family History  Problem Relation Age of Onset  . Colon cancer Neg Hx   . Anesthesia problems Neg Hx   . Hypotension Neg Hx   . Malignant hyperthermia Neg Hx   . Pseudochol deficiency Neg Hx     ROS:  Insomnia, back pain and neuropathy, family stress.     Otherwise as stated in the HPI and negative for all other systems.  PHYSICAL EXAM BP (!) 148/82   Pulse 71   Ht 6\' 3"  (1.905 m)   Wt 256 lb (116.1 kg)   BMI 32.00 kg/m  GENERAL:  Well appearing HEENT:  Pupils equal round and reactive, fundi not visualized, oral mucosa unremarkable NECK:  No jugular venous distention, waveform within normal limits, carotid upstroke brisk and symmetric, no bruits, no thyromegaly LYMPHATICS:  No cervical,  inguinal adenopathy LUNGS:  Wheezing diffusely BACK:  No CVA tenderness CHEST:  Unremarkable HEART:  PMI not displaced or sustained,S1 and S2 within normal limits, no S3, no S4, no clicks, no rubs, no murmurs ABD:  Flat, positive bowel sounds normal in frequency in pitch, no bruits, no rebound, no guarding, no midline pulsatile mass, no hepatomegaly, no splenomegaly EXT:  2 plus pulses throughout, trace edema, no cyanosis no clubbing SKIN:  No rashes no nodules, vitiligo NEURO:  Cranial nerves II through XII grossly intact, motor grossly intact throughout PSYCH:  Cognitively intact, oriented to person place and time   EKG:  Sinus rhythm, rate 71, axis within normal limits, intervals within normal limits, lateral T-wave inversions new.   08/26/2016  ASSESSMENT AND PLAN  CHEST PAIN:   I am afraid that he might have had an out of hospital ACS on Monday with ongoing symptoms.  He needs to be admitted for rule out with heparin and an echocardiogram.  I would plan elective cath unless he has ongoing resting symptoms or significant findings on admit testing.    ATRIAL FLUTTER/FIB:   [name] has a CHA2DS2 - VASc score of 2.  Hold warfarin for cath.   HTN:   BP is slightly high.  Continue his current therapy for now.    TOBACCO ABUSE:    We have talked about this at great length.  Continue education.    ALCOHOL USE:  Last drink Dec of 2015

## 2016-08-25 NOTE — Telephone Encounter (Signed)
The patient has an appt with me.

## 2016-08-25 NOTE — Telephone Encounter (Signed)
Pt c/o of Chest Pain: STAT if CP now or developed within 24 hours  1. Are you having CP right now? This morning 08-25-16  2. Are you experiencing any other symptoms (ex. SOB, nausea, vomiting, sweating)? SOB  3. How long have you been experiencing CP? Since Monday 08-22-16  4. Is your CP continuous or coming and going? Coming and going   5. Have you taken Nitroglycerin? N/A  Patient states that on Monday, 08-22-16 he got out of the vehicle to open the door for his wife and as he reached for the passenger's door he had "super bad chest pains." Patient also states that he "got real weak" and his breathing was "shallow." Patient states that he stayed in that condition for a while. Patient believes he had a heart attack. Please call to discuss,thanks. ?

## 2016-08-26 ENCOUNTER — Observation Stay (HOSPITAL_COMMUNITY)
Admission: AD | Admit: 2016-08-26 | Discharge: 2016-08-28 | Disposition: A | Discharge status: Home / Self Care | Payer: BLUE CROSS/BLUE SHIELD | Source: Ambulatory Visit | Attending: Cardiology | Admitting: Cardiology

## 2016-08-26 ENCOUNTER — Encounter (HOSPITAL_COMMUNITY): Payer: Self-pay | Admitting: Cardiology

## 2016-08-26 ENCOUNTER — Ambulatory Visit (HOSPITAL_BASED_OUTPATIENT_CLINIC_OR_DEPARTMENT_OTHER): Payer: BLUE CROSS/BLUE SHIELD | Admitting: Cardiology

## 2016-08-26 ENCOUNTER — Encounter: Payer: Self-pay | Admitting: Cardiology

## 2016-08-26 VITALS — BP 148/82 | HR 71 | Ht 75.0 in | Wt 256.0 lb

## 2016-08-26 DIAGNOSIS — I119 Hypertensive heart disease without heart failure: Secondary | ICD-10-CM | POA: Insufficient documentation

## 2016-08-26 DIAGNOSIS — I200 Unstable angina: Secondary | ICD-10-CM | POA: Diagnosis not present

## 2016-08-26 DIAGNOSIS — Z6831 Body mass index (BMI) 31.0-31.9, adult: Secondary | ICD-10-CM | POA: Diagnosis not present

## 2016-08-26 DIAGNOSIS — Z7984 Long term (current) use of oral hypoglycemic drugs: Secondary | ICD-10-CM | POA: Insufficient documentation

## 2016-08-26 DIAGNOSIS — F17210 Nicotine dependence, cigarettes, uncomplicated: Secondary | ICD-10-CM | POA: Diagnosis not present

## 2016-08-26 DIAGNOSIS — J441 Chronic obstructive pulmonary disease with (acute) exacerbation: Secondary | ICD-10-CM | POA: Diagnosis present

## 2016-08-26 DIAGNOSIS — G4730 Sleep apnea, unspecified: Secondary | ICD-10-CM | POA: Insufficient documentation

## 2016-08-26 DIAGNOSIS — J449 Chronic obstructive pulmonary disease, unspecified: Secondary | ICD-10-CM | POA: Diagnosis present

## 2016-08-26 DIAGNOSIS — Z720 Tobacco use: Secondary | ICD-10-CM | POA: Diagnosis present

## 2016-08-26 DIAGNOSIS — Z7901 Long term (current) use of anticoagulants: Secondary | ICD-10-CM | POA: Insufficient documentation

## 2016-08-26 DIAGNOSIS — I4891 Unspecified atrial fibrillation: Secondary | ICD-10-CM | POA: Insufficient documentation

## 2016-08-26 DIAGNOSIS — I4892 Unspecified atrial flutter: Secondary | ICD-10-CM | POA: Insufficient documentation

## 2016-08-26 DIAGNOSIS — E1165 Type 2 diabetes mellitus with hyperglycemia: Secondary | ICD-10-CM | POA: Insufficient documentation

## 2016-08-26 DIAGNOSIS — I10 Essential (primary) hypertension: Secondary | ICD-10-CM | POA: Diagnosis present

## 2016-08-26 DIAGNOSIS — I483 Typical atrial flutter: Secondary | ICD-10-CM

## 2016-08-26 DIAGNOSIS — G4733 Obstructive sleep apnea (adult) (pediatric): Secondary | ICD-10-CM | POA: Diagnosis not present

## 2016-08-26 DIAGNOSIS — Z79899 Other long term (current) drug therapy: Secondary | ICD-10-CM | POA: Diagnosis not present

## 2016-08-26 DIAGNOSIS — I480 Paroxysmal atrial fibrillation: Secondary | ICD-10-CM | POA: Diagnosis not present

## 2016-08-26 DIAGNOSIS — E785 Hyperlipidemia, unspecified: Secondary | ICD-10-CM | POA: Insufficient documentation

## 2016-08-26 DIAGNOSIS — R072 Precordial pain: Secondary | ICD-10-CM | POA: Diagnosis not present

## 2016-08-26 DIAGNOSIS — E1142 Type 2 diabetes mellitus with diabetic polyneuropathy: Secondary | ICD-10-CM | POA: Insufficient documentation

## 2016-08-26 DIAGNOSIS — K219 Gastro-esophageal reflux disease without esophagitis: Secondary | ICD-10-CM | POA: Insufficient documentation

## 2016-08-26 DIAGNOSIS — R079 Chest pain, unspecified: Secondary | ICD-10-CM | POA: Diagnosis present

## 2016-08-26 DIAGNOSIS — J439 Emphysema, unspecified: Secondary | ICD-10-CM | POA: Insufficient documentation

## 2016-08-26 DIAGNOSIS — E669 Obesity, unspecified: Secondary | ICD-10-CM | POA: Diagnosis present

## 2016-08-26 LAB — T4, FREE: Free T4: 0.89 ng/dL (ref 0.61–1.12)

## 2016-08-26 LAB — TROPONIN I

## 2016-08-26 LAB — CBC WITH DIFFERENTIAL/PLATELET
BASOS PCT: 0 %
Basophils Absolute: 0 10*3/uL (ref 0.0–0.1)
EOS PCT: 2 %
Eosinophils Absolute: 0.1 10*3/uL (ref 0.0–0.7)
HEMATOCRIT: 38.5 % — AB (ref 39.0–52.0)
Hemoglobin: 12.5 g/dL — ABNORMAL LOW (ref 13.0–17.0)
Lymphocytes Relative: 20 %
Lymphs Abs: 1.2 10*3/uL (ref 0.7–4.0)
MCH: 28.7 pg (ref 26.0–34.0)
MCHC: 32.5 g/dL (ref 30.0–36.0)
MCV: 88.3 fL (ref 78.0–100.0)
MONO ABS: 0.6 10*3/uL (ref 0.1–1.0)
MONOS PCT: 10 %
NEUTROS ABS: 4.2 10*3/uL (ref 1.7–7.7)
Neutrophils Relative %: 68 %
Platelets: 129 10*3/uL — ABNORMAL LOW (ref 150–400)
RBC: 4.36 MIL/uL (ref 4.22–5.81)
RDW: 14.9 % (ref 11.5–15.5)
WBC: 6.2 10*3/uL (ref 4.0–10.5)

## 2016-08-26 LAB — COMPREHENSIVE METABOLIC PANEL
ALT: 28 U/L (ref 17–63)
AST: 36 U/L (ref 15–41)
Albumin: 3.5 g/dL (ref 3.5–5.0)
Alkaline Phosphatase: 108 U/L (ref 38–126)
Anion gap: 9 (ref 5–15)
BILIRUBIN TOTAL: 0.6 mg/dL (ref 0.3–1.2)
BUN: 8 mg/dL (ref 6–20)
CHLORIDE: 103 mmol/L (ref 101–111)
CO2: 26 mmol/L (ref 22–32)
CREATININE: 0.67 mg/dL (ref 0.61–1.24)
Calcium: 8.9 mg/dL (ref 8.9–10.3)
Glucose, Bld: 167 mg/dL — ABNORMAL HIGH (ref 65–99)
POTASSIUM: 3.7 mmol/L (ref 3.5–5.1)
Sodium: 138 mmol/L (ref 135–145)
TOTAL PROTEIN: 6.1 g/dL — AB (ref 6.5–8.1)

## 2016-08-26 LAB — GLUCOSE, CAPILLARY
GLUCOSE-CAPILLARY: 168 mg/dL — AB (ref 65–99)
Glucose-Capillary: 170 mg/dL — ABNORMAL HIGH (ref 65–99)

## 2016-08-26 LAB — MAGNESIUM: MAGNESIUM: 1.8 mg/dL (ref 1.7–2.4)

## 2016-08-26 LAB — PROTIME-INR
INR: 2.36
Prothrombin Time: 26.2 seconds — ABNORMAL HIGH (ref 11.4–15.2)

## 2016-08-26 LAB — TSH: TSH: 2.519 u[IU]/mL (ref 0.350–4.500)

## 2016-08-26 MED ORDER — SODIUM CHLORIDE 0.9% FLUSH: 3.0000 mL | INTRAVENOUS | Status: DC | PRN

## 2016-08-26 MED ORDER — SODIUM CHLORIDE 0.9% FLUSH
3.0000 mL | Freq: Two times a day (BID) | INTRAVENOUS | Status: DC
  Administered 2016-08-26 – 2016-08-28 (×4): 3 mL via INTRAVENOUS

## 2016-08-26 MED ORDER — ISOSORBIDE MONONITRATE ER 30 MG PO TB24
30.0000 mg | ORAL_TABLET | Freq: Every day | ORAL | Status: DC
  Administered 2016-08-26 – 2016-08-28 (×3): 30 mg via ORAL
  Filled 2016-08-26 (×3): qty 1

## 2016-08-26 MED ORDER — ALBUTEROL SULFATE HFA 108 (90 BASE) MCG/ACT IN AERS: 2.0000 | INHALATION_SPRAY | Freq: Four times a day (QID) | RESPIRATORY_TRACT | Status: DC | PRN

## 2016-08-26 MED ORDER — HYDROCODONE-ACETAMINOPHEN 5-325 MG PO TABS
1.0000 | ORAL_TABLET | Freq: Four times a day (QID) | ORAL | Status: DC | PRN
  Administered 2016-08-26 – 2016-08-28 (×6): 1 via ORAL
  Filled 2016-08-26 (×6): qty 1

## 2016-08-26 MED ORDER — SODIUM CHLORIDE 0.9 % WEIGHT BASED INFUSION: 1.0000 mL/kg/h | INTRAVENOUS | Status: DC

## 2016-08-26 MED ORDER — VITAMIN D 1000 UNITS PO TABS
1000.0000 [IU] | ORAL_TABLET | Freq: Every day | ORAL | Status: DC
  Administered 2016-08-27 – 2016-08-28 (×2): 1000 [IU] via ORAL
  Filled 2016-08-26 (×2): qty 1

## 2016-08-26 MED ORDER — ASPIRIN 81 MG PO CHEW: 81.0000 mg | CHEWABLE_TABLET | ORAL | Status: DC

## 2016-08-26 MED ORDER — ASPIRIN 300 MG RE SUPP
300.0000 mg | RECTAL | Status: AC
  Filled 2016-08-26: qty 1

## 2016-08-26 MED ORDER — ASPIRIN EC 81 MG PO TBEC
81.0000 mg | DELAYED_RELEASE_TABLET | Freq: Every day | ORAL | Status: DC
  Administered 2016-08-27: 81 mg via ORAL
  Filled 2016-08-26 (×3): qty 1

## 2016-08-26 MED ORDER — LISINOPRIL 40 MG PO TABS
40.0000 mg | ORAL_TABLET | Freq: Every day | ORAL | Status: DC
  Administered 2016-08-27 – 2016-08-28 (×2): 40 mg via ORAL
  Filled 2016-08-26 (×2): qty 1

## 2016-08-26 MED ORDER — ACETAMINOPHEN 325 MG PO TABS: 650.0000 mg | ORAL_TABLET | ORAL | Status: DC | PRN

## 2016-08-26 MED ORDER — ASPIRIN 81 MG PO CHEW
324.0000 mg | CHEWABLE_TABLET | ORAL | Status: AC
  Administered 2016-08-26: 324 mg via ORAL
  Filled 2016-08-26: qty 4

## 2016-08-26 MED ORDER — ADULT MULTIVITAMIN W/MINERALS CH
1.0000 | ORAL_TABLET | Freq: Every day | ORAL | Status: DC
  Administered 2016-08-27 – 2016-08-28 (×2): 1 via ORAL
  Filled 2016-08-26 (×2): qty 1

## 2016-08-26 MED ORDER — BISOPROLOL FUMARATE 5 MG PO TABS
10.0000 mg | ORAL_TABLET | Freq: Every day | ORAL | Status: DC
  Administered 2016-08-27 – 2016-08-28 (×2): 10 mg via ORAL
  Filled 2016-08-26 (×2): qty 2

## 2016-08-26 MED ORDER — DILTIAZEM HCL ER COATED BEADS 120 MG PO CP24
120.0000 mg | ORAL_CAPSULE | Freq: Every day | ORAL | Status: DC
  Administered 2016-08-27 – 2016-08-28 (×2): 120 mg via ORAL
  Filled 2016-08-26 (×2): qty 1

## 2016-08-26 MED ORDER — INSULIN ASPART 100 UNIT/ML ~~LOC~~ SOLN
0.0000 [IU] | Freq: Every day | SUBCUTANEOUS | Status: DC
  Administered 2016-08-27: 2 [IU] via SUBCUTANEOUS

## 2016-08-26 MED ORDER — INSULIN ASPART 100 UNIT/ML ~~LOC~~ SOLN
0.0000 [IU] | Freq: Three times a day (TID) | SUBCUTANEOUS | Status: DC
  Administered 2016-08-26: 2 [IU] via SUBCUTANEOUS
  Administered 2016-08-27 (×2): 3 [IU] via SUBCUTANEOUS
  Administered 2016-08-27: 2 [IU] via SUBCUTANEOUS

## 2016-08-26 MED ORDER — SODIUM CHLORIDE 0.9 % IV SOLN
INTRAVENOUS | Status: DC
Start: 2016-08-26 — End: 2016-08-28

## 2016-08-26 MED ORDER — PANTOPRAZOLE SODIUM 40 MG PO TBEC
40.0000 mg | DELAYED_RELEASE_TABLET | Freq: Every day | ORAL | Status: DC
  Administered 2016-08-27 – 2016-08-28 (×2): 40 mg via ORAL
  Filled 2016-08-26 (×2): qty 1

## 2016-08-26 MED ORDER — NITROGLYCERIN 0.4 MG SL SUBL: 0.4000 mg | SUBLINGUAL_TABLET | SUBLINGUAL | Status: DC | PRN

## 2016-08-26 MED ORDER — ZOLPIDEM TARTRATE 5 MG PO TABS: 5.0000 mg | ORAL_TABLET | Freq: Every evening | ORAL | Status: DC | PRN

## 2016-08-26 MED ORDER — POTASSIUM CHLORIDE CRYS ER 10 MEQ PO TBCR
10.0000 meq | EXTENDED_RELEASE_TABLET | Freq: Every day | ORAL | Status: DC
  Administered 2016-08-27 – 2016-08-28 (×2): 10 meq via ORAL
  Filled 2016-08-26 (×3): qty 1

## 2016-08-26 MED ORDER — FUROSEMIDE 40 MG PO TABS
40.0000 mg | ORAL_TABLET | Freq: Every day | ORAL | Status: DC | PRN
  Administered 2016-08-27: 40 mg via ORAL
  Filled 2016-08-26: qty 1

## 2016-08-26 MED ORDER — ALPRAZOLAM 0.25 MG PO TABS
0.2500 mg | ORAL_TABLET | Freq: Two times a day (BID) | ORAL | Status: DC | PRN
  Administered 2016-08-26 – 2016-08-27 (×2): 0.25 mg via ORAL
  Filled 2016-08-26 (×2): qty 1

## 2016-08-26 MED ORDER — SODIUM CHLORIDE 0.9 % WEIGHT BASED INFUSION: 3.0000 mL/kg/h | INTRAVENOUS | Status: DC

## 2016-08-26 MED ORDER — SODIUM CHLORIDE 0.9 % IV SOLN: 250.0000 mL | INTRAVENOUS | Status: DC | PRN

## 2016-08-26 MED ORDER — ONDANSETRON HCL 4 MG/2ML IJ SOLN: 4.0000 mg | Freq: Four times a day (QID) | INTRAMUSCULAR | Status: DC | PRN

## 2016-08-26 MED ORDER — PRAVASTATIN SODIUM 40 MG PO TABS
80.0000 mg | ORAL_TABLET | Freq: Every day | ORAL | Status: DC
  Administered 2016-08-26 – 2016-08-27 (×2): 80 mg via ORAL
  Filled 2016-08-26 (×3): qty 2

## 2016-08-26 NOTE — H&P (Signed)
HISTORY AND PHYSICAL  Per Dr. Percival Spanish   HPI The patient presents for followup of atrial flutter and atrial fibrillation.  He is status post flutter ablation.  He was not a candidate for amio or flecainide.  I sent him to the atrial fib clinic. He was on Multaq for awhile but now has been taken off of that because his atrial fib burden was low.  He called yesterday with chest pain.  He had normal coronaries on cath in 2008.  Perfusion study was negative in 2014.      He report that his chest pain was on Monday.  He was opening the car door for his wife.  He had a severe dull pain under his left breast.  It lasted for a couple of hours but he did not call 911 or [place].  He had pain off and on again on Tuesday and Wed.  He now has increased fatigue and SOB with any activity and is having some exertional chest pain.  He does not describe other associated symptoms such as nausea, vomiting or diaphoresis.  He has not had palpitations, presyncope or syncope.  He does not think that he has had this discomfort before.  He did see [name], MD on Tuesday but I don't have these records and their office is closed.           Allergies  Allergen Reactions  . Adhesive [Tape]     itching  . Latex Itching    When tape is on the skin too long skin gets red & itching          Current Outpatient Prescriptions  Medication Sig Dispense Refill  . albuterol (PROVENTIL HFA;VENTOLIN HFA) 108 (90 BASE) MCG/ACT inhaler Inhale 2 puffs into the lungs every 6 (six) hours as needed for wheezing or shortness of breath.    . bisoprolol (ZEBETA) 10 MG tablet Take 10 mg by mouth daily.    . Cholecalciferol (VITAMIN D) 2000 UNITS CAPS Take 1 capsule by mouth daily.    [name] diltiazem (CARDIZEM CD) 120 MG 24 hr capsule Take 120 mg by mouth daily.    [name] diltiazem (CARDIZEM) 60 MG tablet Take 60 mg by mouth daily as needed (ATRIAL FIB).    . furosemide (LASIX) 40 MG tablet Take 1 tablet (40 mg total) by  mouth daily as needed for fluid. 30 tablet 2  . glimepiride (AMARYL) 1 MG tablet Take 1 mg by mouth daily before breakfast.     . HYDROcodone-acetaminophen (NORCO/VICODIN) 5-325 MG per tablet Take 1 tablet by mouth every 6 (six) hours as needed for pain.    . Liraglutide (VICTOZA) 18 MG/3ML SOPN Inject 12 mLs into the skin daily.     [name] lisinopril (PRINIVIL,ZESTRIL) 40 MG tablet Take 1 tablet (40 mg total) by mouth daily. 90 tablet 3  . metFORMIN (GLUCOPHAGE) 1000 MG tablet Take 1,000 mg by mouth 2 (two) times daily with a meal.    . Multiple Vitamin (MULTIVITAMIN WITH MINERALS) TABS Take 1 tablet by mouth daily.    [name] omeprazole (PRILOSEC) 20 MG capsule Take 20 mg by mouth 2 (two) times daily.     . potassium chloride (KLOR-CON 10) 10 MEQ tablet Take 10 mEq by mouth daily.     . pravastatin (PRAVACHOL) 80 MG tablet TAKE 1 TABLET BY MOUTH EVERY NIGHT AT BEDTIME 30 tablet 11  . warfarin (COUMADIN) 5 MG tablet Take 1 to 1 and 1/2 tablets every day as directed by  coumadin clinic 40 tablet 2   No current facility-administered medications for this visit.         Past Medical History:  Diagnosis Date  . Arthritis    lower back  . Asthma   . Atrial flutter ([place])    s/p CTI ablation by Dr Rayann Heman  . Brain aneurysm 2009   questionable. A follow up CTA in 2009 showed no evidence of  . Chronic back pain    DDD/stenosis  . Colon polyps    9 polyps removed 10/13/11  . Complication of anesthesia September 11, 2012   slow to awaken after ablation  . Diabetes mellitus    takes Metformin and Glimepiride daily  . Dyspnea    LHC 11/08: EF 60%, normal coronary arteries;  ETT-echo 2/11: normal at 71% PMHR  . Emphysema   . GERD (gastroesophageal reflux disease)    takes Omeprazole bid  . History of shingles   . HLD (hyperlipidemia)    takes Pravastatin daily  . HTN (hypertension)    takes Prinizide daily  . Obesity   . OSA (obstructive sleep apnea)    not  always using cpap  . Overdose 2009   unintentional Flecanide overdose  . Peripheral neuropathy ([place])   . Short-term memory loss   . Tobacco abuse          Past Surgical History:  Procedure Laterality Date  . ANTERIOR CERVICAL DECOMP/DISCECTOMY FUSION  01/04/2012   Procedure: ANTERIOR CERVICAL DECOMPRESSION/DISCECTOMY FUSION 1 LEVEL/HARDWARE REMOVAL;  Surgeon: Ophelia Charter, MD;  Location: [facility];  Service: Neurosurgery;  Laterality: N/A;  explore cervical fusion Cervical six - seven  with removal of codman plate anterior cervical decompression with fusion interbody prothesis plating and bonegraft  . APPENDECTOMY  2-54yrs ago  . ATRIAL FIBRILLATION ABLATION N/A 09/11/2012   PT DID NOT HAVE AN ATRIAL FIBRILLATION ABLATION IN 2014!  ATRIAL FLUTTER ABLATION ONLY  . ATRIAL FLUTTER ABLATION  09/11/2012   CTI ablation by Dr Rayann Heman  . CARDIAC CATHETERIZATION  2008   no significant CAD  . CARDIOVERSION  05/05/2011   Procedure: CARDIOVERSION;  Surgeon: Lelon Perla, MD;  Location: [facility] ENDOSCOPY;  Service: Cardiovascular;  Laterality: N/A;  . CARDIOVERSION Bilateral 07/26/2012   Procedure: CARDIOVERSION;  Surgeon: Minus Breeding, MD;  Location: [facility] ENDOSCOPY;  Service: Cardiovascular;  Laterality: Bilateral;  . CARPAL TUNNEL RELEASE  99/2000   bilateral  . COLONOSCOPY WITH PROPOFOL N/A 12/13/2012   Procedure: COLONOSCOPY WITH PROPOFOL;  Surgeon: Milus Banister, MD;  Location: [facility] ENDOSCOPY;  Service: Endoscopy;  Laterality: N/A;  . ELECTROPHYSIOLOGIC STUDY N/A 04/21/2015   Procedure: Atrial Fibrillation Ablation;  Surgeon: Thompson Grayer, MD;  Location: [facility] CV LAB;  Service: Cardiovascular;  Laterality: N/A;  . HERNIA REPAIR    . KNEE SURGERY  6-77yrs ago   left  . Left inguinal hernia repair     as a child  . NASAL SEPTOPLASTY W/ TURBINOPLASTY Bilateral 02/19/2014   Procedure: NASAL SEPTOPLASTY WITH BILATERAL TURBINATE REDUCTION;  Surgeon: Jodi Marble, MD;  Location: [facility];  Service: ENT;  Laterality: Bilateral;  . NECK SURGERY  39yrs ago  . right shoulder surgery  4-66yrs ago   cyst removed  . TEE WITHOUT CARDIOVERSION  05/05/2011   Procedure: TRANSESOPHAGEAL ECHOCARDIOGRAM (TEE);  Surgeon: Lelon Perla, MD;  Location: [facility] ENDOSCOPY;  Service: Cardiovascular;  Laterality: N/A;  . TEE WITHOUT CARDIOVERSION N/A 04/21/2015   Procedure: TRANSESOPHAGEAL ECHOCARDIOGRAM (TEE);  Surgeon: Sueanne Margarita, MD;  Location: [facility] ENDOSCOPY;  Service: Cardiovascular;  Laterality: N/A;  . THROAT SURGERY  4-68yrs ago   "thought " it was cancer but came back not  . TONSILLECTOMY     Social History        Social History  . Marital status: Married    Spouse name: N/A  . Number of children: 2  . Years of education: N/A       Occupational History  . Mechanic           Social History Main Topics  . Smoking status: Current Every Day Smoker    Packs/day: 0.30    Years: 40.00    Types: Cigarettes  . Smokeless tobacco: Never Used  . Alcohol use 0.0 oz/week     Comment: couple of times a month liquor, he tells me he typically drinks 2-4 drinks most days but hasnt drank anything in 6 weeks  . Drug use: No  . Sexual activity: Yes       Other Topics Concern  . Not on file      Social History Narrative   Daily caffeine(Mountain Dew)       Lives in [place] with spouse.   Unemployed due to chronic back/ leg pain              Family History  Problem Relation Age of Onset  . Colon cancer Neg Hx   . Anesthesia problems Neg Hx   . Hypotension Neg Hx   . Malignant hyperthermia Neg Hx   . Pseudochol deficiency Neg Hx     ROS:  Insomnia, back pain and neuropathy, family stress.     Otherwise as stated in the HPI and negative for all other systems.  PHYSICAL EXAM BP (!) 148/82   Pulse 71   Ht 6\' 3"  (1.905 m)   Wt 256 lb (116.1 kg)   BMI 32.00 kg/m  GENERAL:  Well  appearing HEENT:  Pupils equal round and reactive, fundi not visualized, oral mucosa unremarkable NECK:  No jugular venous distention, waveform within normal limits, carotid upstroke brisk and symmetric, no bruits, no thyromegaly LYMPHATICS:  No cervical, inguinal adenopathy LUNGS:  Wheezing diffusely BACK:  No CVA tenderness CHEST:  Unremarkable HEART:  PMI not displaced or sustained,S1 and S2 within normal limits, no S3, no S4, no clicks, no rubs, no murmurs ABD:  Flat, positive bowel sounds normal in frequency in pitch, no bruits, no rebound, no guarding, no midline pulsatile mass, no hepatomegaly, no splenomegaly EXT:  2 plus pulses throughout, trace edema, no cyanosis no clubbing SKIN:  No rashes no nodules, vitiligo NEURO:  Cranial nerves II through XII grossly intact, motor grossly intact throughout PSYCH:  Cognitively intact, oriented to person place and time   EKG:  Sinus rhythm, rate 71, axis within normal limits, intervals within normal limits, lateral T-wave inversions new.   08/26/2016  ASSESSMENT AND PLAN  CHEST PAIN:   I am afraid that he might have had an out of hospital ACS on Monday with ongoing symptoms.  He needs to be admitted for rule out with heparin and an echocardiogram.  I would plan elective cath unless he has ongoing resting symptoms or significant findings on admit testing.    ATRIAL FLUTTER/FIB:   Mr. KENNA KIRN has a CHA2DS2 - VASc score of 2.  Hold warfarin for cath.   HTN:   BP is slightly high.  Continue his current therapy for now.    TOBACCO ABUSE:  We have talked about this at great length.  Continue education.    ALCOHOL USE:  Last drink Dec of 2015

## 2016-08-26 NOTE — Progress Notes (Signed)
ANTICOAGULATION CONSULT NOTE - Initial Consult  Pharmacy Consult for heparin Indication: chest pain/ACS  Allergies  Allergen Reactions  . Adhesive [Tape]     itching  . Latex Itching    When tape is on the skin too long skin gets red & itching    Patient Measurements: Height: 6\' 3"  (190.5 cm) Weight: 251 lb 8 oz (114.1 kg) IBW/kg (Calculated) : 84.5 Heparin Dosing Weight: 108kg  Vital Signs: Temp: 98.4 F (36.9 C) (04/13 1454) Temp Source: Oral (04/13 1454) BP: 161/80 (04/13 1454) Pulse Rate: 73 (04/13 1454)  Labs:  Recent Labs  08/26/16 1511  HGB 12.5*  HCT 38.5*  PLT 129*  LABPROT 26.2*  INR 2.36  CREATININE 0.67  TROPONINI <0.03    Estimated Creatinine Clearance: 137.1 mL/min (by C-G formula based on SCr of 0.67 mg/dL).  Assessment: 30 YOM with AFib on warfarin PTA (home dose 7.5mg  daily except 5mg  on TTSaturday) with CHADSVASC of 2, to start heparin when INR <2 for ACS.  INR on admission today is 2.36. Hgb 12.5, plts 129- no bleeding noted.  Goal of Therapy:  Heparin level 0.3-0.7 units/ml Monitor platelets by anticoagulation protocol: Yes   Plan:  -daily INR and CBC -start heparin when INR <2 -follow cardiology plans and any reversal agents being given  Valene Villa D. Gwendolyn Mclees, PharmD, BCPS Clinical Pharmacist Pager: [phone] 08/26/2016 4:26 PM

## 2016-08-26 NOTE — Patient Instructions (Signed)
GO TO [place] OFF [place]

## 2016-08-26 NOTE — Progress Notes (Signed)
Pt is alert and oriented Directly admitted with no Distress, Vital Stable, Staying for observation til Monday. CP Reported since Monday, Gave Asprin. Plan for Cath on Monday.

## 2016-08-26 NOTE — Progress Notes (Signed)
Pt arrived to room.  No complaints

## 2016-08-26 NOTE — Telephone Encounter (Signed)
REFILL 

## 2016-08-27 ENCOUNTER — Inpatient Hospital Stay (HOSPITAL_COMMUNITY): Payer: BLUE CROSS/BLUE SHIELD

## 2016-08-27 DIAGNOSIS — I200 Unstable angina: Secondary | ICD-10-CM

## 2016-08-27 DIAGNOSIS — G4730 Sleep apnea, unspecified: Secondary | ICD-10-CM | POA: Diagnosis not present

## 2016-08-27 DIAGNOSIS — I119 Hypertensive heart disease without heart failure: Secondary | ICD-10-CM | POA: Diagnosis not present

## 2016-08-27 DIAGNOSIS — G4733 Obstructive sleep apnea (adult) (pediatric): Secondary | ICD-10-CM | POA: Diagnosis not present

## 2016-08-27 LAB — GLUCOSE, CAPILLARY
GLUCOSE-CAPILLARY: 220 mg/dL — AB (ref 65–99)
GLUCOSE-CAPILLARY: 200 mg/dL — AB (ref 65–99)
Glucose-Capillary: 241 mg/dL — ABNORMAL HIGH (ref 65–99)
Glucose-Capillary: 205 mg/dL — ABNORMAL HIGH (ref 65–99)

## 2016-08-27 LAB — BASIC METABOLIC PANEL
Anion gap: 7 (ref 5–15)
BUN: 8 mg/dL (ref 6–20)
CALCIUM: 8.7 mg/dL — AB (ref 8.9–10.3)
CO2: 29 mmol/L (ref 22–32)
CREATININE: 0.65 mg/dL (ref 0.61–1.24)
Chloride: 101 mmol/L (ref 101–111)
GFR calc Af Amer: >60 mL/min (ref >60)
GLUCOSE: 226 mg/dL — AB (ref 65–99)
Potassium: 3.9 mmol/L (ref 3.5–5.1)
Sodium: 137 mmol/L (ref 135–145)

## 2016-08-27 LAB — PROTIME-INR
INR: 2.12
Prothrombin Time: 24.1 seconds — ABNORMAL HIGH (ref 11.4–15.2)

## 2016-08-27 LAB — CBC
HCT: 38.4 % — ABNORMAL LOW (ref 39.0–52.0)
Hemoglobin: 12.3 g/dL — ABNORMAL LOW (ref 13.0–17.0)
MCH: 28.1 pg (ref 26.0–34.0)
MCHC: 32 g/dL (ref 30.0–36.0)
MCV: 87.9 fL (ref 78.0–100.0)
PLATELETS: 142 10*3/uL — AB (ref 150–400)
RBC: 4.37 MIL/uL (ref 4.22–5.81)
RDW: 14.8 % (ref 11.5–15.5)
WBC: 6.5 10*3/uL (ref 4.0–10.5)

## 2016-08-27 LAB — LIPID PANEL
Cholesterol: 153 mg/dL (ref 0–200)
HDL: 29 mg/dL — AB (ref >40)
LDL CALC: 58 mg/dL (ref 0–99)
Total CHOL/HDL Ratio: 5.3 RATIO
Triglycerides: 330 mg/dL — ABNORMAL HIGH (ref <150)
VLDL: 66 mg/dL — ABNORMAL HIGH (ref 0–40)

## 2016-08-27 LAB — HIV ANTIBODY (ROUTINE TESTING W REFLEX): HIV SCREEN 4TH GENERATION: NONREACTIVE

## 2016-08-27 LAB — HEMOGLOBIN A1C
HEMOGLOBIN A1C: 8.5 % — AB (ref 4.8–5.6)
MEAN PLASMA GLUCOSE: 197 mg/dL

## 2016-08-27 LAB — TROPONIN I

## 2016-08-27 MED ORDER — GLIMEPIRIDE 1 MG PO TABS
1.0000 mg | ORAL_TABLET | Freq: Every day | ORAL | Status: DC
  Filled 2016-08-27: qty 1

## 2016-08-27 MED ORDER — WARFARIN SODIUM 7.5 MG PO TABS
7.5000 mg | ORAL_TABLET | Freq: Once | ORAL | Status: AC
  Administered 2016-08-27: 7.5 mg via ORAL
  Filled 2016-08-27: qty 1

## 2016-08-27 MED ORDER — METFORMIN HCL 500 MG PO TABS
1000.0000 mg | ORAL_TABLET | Freq: Two times a day (BID) | ORAL | Status: DC
  Administered 2016-08-27: 1000 mg via ORAL
  Filled 2016-08-27 (×2): qty 2

## 2016-08-27 MED ORDER — WARFARIN - PHARMACIST DOSING INPATIENT: Freq: Every day | Status: DC

## 2016-08-27 MED ORDER — ALBUTEROL SULFATE (2.5 MG/3ML) 0.083% IN NEBU
2.5000 mg | INHALATION_SOLUTION | RESPIRATORY_TRACT | Status: DC | PRN
  Administered 2016-08-27: 2.5 mg via RESPIRATORY_TRACT
  Filled 2016-08-27: qty 3

## 2016-08-27 NOTE — Progress Notes (Addendum)
[place] for Heparin >> Coumadin Indication: chest pain/ACS >> atrial fibrillation  Assessment: 4 YOM with AFib on warfarin PTA (home dose 7.5mg  daily except 5mg  on TTSaturday), INR on admission 2.36 with CHADSVASC of 2, to start heparin when INR <2 for ACS.   INR today is 2.12 (still above INR threshold for heparin initiation). Hgb 12.3, plts 142 - no bleeding noted.  Goal of Therapy:  Heparin level 0.3-0.7 units/ml Monitor platelets by anticoagulation protocol: Yes   Plan:  -daily INR and CBC -start heparin when INR <2 -follow cardiology plans and any reversal agents being given  Addendum: Pharmacy now consulted to resume warfarin. He has missed 2 doses since admission. Will give increased dose of warfarin 7.5 mg po once this evening in anticipation INR will continue to decrease. Monitor daily INR and s/sx's of bleeding.    Allergies  Allergen Reactions  . Adhesive [Tape]     itching  . Latex Itching    When tape is on the skin too long skin gets red & itching    Patient Measurements: Height: 6\' 3"  (190.5 cm) Weight: 250 lb 9.6 oz (113.7 kg) IBW/kg (Calculated) : 84.5 Heparin Dosing Weight: 108kg  Vital Signs: Temp: 98.7 F (37.1 C) (04/14 0811) Temp Source: Oral (04/14 0811) BP: 136/64 (04/14 0811) Pulse Rate: 65 (04/14 0811)  Labs:  Recent Labs  08/26/16 1511 08/26/16 1816 08/27/16 0235  HGB 12.5*  --  12.3*  HCT 38.5*  --  38.4*  PLT 129*  --  142*  LABPROT 26.2*  --  24.1*  INR 2.36  --  2.12  CREATININE 0.67  --  0.65  TROPONINI <0.03 <0.03 <0.03    Estimated Creatinine Clearance: 137 mL/min (by C-G formula based on SCr of 0.65 mg/dL).  [name], PharmD PGY1 Pharmacy Resident [phone] (Pager) 08/27/2016 10:26 AM

## 2016-08-27 NOTE — Progress Notes (Signed)
Progress Note  Patient Name: [name] Date of Encounter: 08/27/2016  Primary Cardiologist: Dr. Percival Spanish  Subjective   SOB last night.   No further chest pain.   Inpatient Medications    Scheduled Meds: . [START ON 08/29/2016] aspirin  81 mg Oral Pre-Cath  . aspirin EC  81 mg Oral Daily  . bisoprolol  10 mg Oral Daily  . cholecalciferol  1,000 Units Oral Daily  . diltiazem  120 mg Oral Daily  . insulin aspart  0-5 Units Subcutaneous QHS  . insulin aspart  0-9 Units Subcutaneous TID WC  . isosorbide mononitrate  30 mg Oral Daily  . lisinopril  40 mg Oral Daily  . multivitamin with minerals  1 tablet Oral Daily  . pantoprazole  40 mg Oral Daily  . potassium chloride  10 mEq Oral Daily  . pravastatin  80 mg Oral QHS  . sodium chloride flush  3 mL Intravenous Q12H   Continuous Infusions: . sodium chloride    . [START ON 08/29/2016] sodium chloride     Followed by  . [START ON 08/29/2016] sodium chloride     PRN Meds: sodium chloride, acetaminophen, albuterol, ALPRAZolam, furosemide, HYDROcodone-acetaminophen, nitroGLYCERIN, ondansetron (ZOFRAN) IV, sodium chloride flush, zolpidem   Vital Signs    Vitals:   08/27/16 0100 08/27/16 0555 08/27/16 0811 08/27/16 0955  BP: 131/67 140/68 136/64   Pulse: 65 66 65   Resp: 20 (!) 22 20   Temp: 98 F (36.7 C) 97.7 F (36.5 C) 98.7 F (37.1 C)   TempSrc: Oral Oral Oral   SpO2: 95% 96% 94% 96%  Weight:  250 lb 9.6 oz (113.7 kg)    Height:        Intake/Output Summary (Last 24 hours) at 08/27/16 1028 Last data filed at 08/27/16 0909  Gross per 24 hour  Intake              720 ml  Output             1050 ml  Net             -330 ml   Filed Weights   08/26/16 1454 08/27/16 0555  Weight: 251 lb 8 oz (114.1 kg) 250 lb 9.6 oz (113.7 kg)    Telemetry    NSR - Personally Reviewed  ECG    NA - Personally Reviewed  Physical Exam   GEN: No acute distress.   Neck: No  JVD Cardiac: RRR, no murmurs, rubs, or  gallops.  Respiratory: Diffuse wheezing unchanged from previous. GI: Soft, nontender, non-distended  MS: No  edema; No deformity. Neuro:  Nonfocal  Psych: Normal affect   Labs    Chemistry Recent Labs Lab 08/26/16 1511 08/27/16 0235  NA 138 137  K 3.7 3.9  CL 103 101  CO2 26 29  GLUCOSE 167* 226*  BUN 8 8  CREATININE 0.67 0.65  CALCIUM 8.9 8.7*  PROT 6.1*  --   ALBUMIN 3.5  --   AST 36  --   ALT 28  --   ALKPHOS 108  --   BILITOT 0.6  --   GFRNONAA >60 >60  GFRAA >60 >60  ANIONGAP 9 7     Hematology Recent Labs Lab 08/26/16 1511 08/27/16 0235  WBC 6.2 6.5  RBC 4.36 4.37  HGB 12.5* 12.3*  HCT 38.5* 38.4*  MCV 88.3 87.9  MCH 28.7 28.1  MCHC 32.5 32.0  RDW 14.9 14.8  PLT 129* 142*  Cardiac Enzymes Recent Labs Lab 08/26/16 1511 08/26/16 1816 08/27/16 0235  TROPONINI <0.03 <0.03 <0.03   No results for input(s): TROPIPOC in the last 168 hours.   BNPNo results for input(s): BNP, PROBNP in the last 168 hours.   DDimer No results for input(s): DDIMER in the last 168 hours.   Radiology    No results found.  Cardiac Studies   NA  Patient Profile     61 y.o. male who was admitted for evaluation of chest pain with EKG changes. He has a history of atrial flutter and atrial fibrillation.  He is status post flutter ablation.  He was not a candidate for amio or flecainide. He was on Multaq for awhile but now has been taken off of that because his atrial fib burden was low.   He had normal coronaries on cath in 2008.  Perfusion study was negative in 2014.    He report that his chest pain was on Monday.  He came to the clinic on Friday and was noted to have new T wave inversion on his EKG with ongoing intermittent pain and progressive dyspnea.   Assessment & Plan    UNSTABLE ANGINA:  Enzymes negative.   I am less suspicious than I was yesterday.  I thought he might have had an out of hospital MI.  EKG on admit was less impressive.  I will plan Lexiscan  Myoview in the AM.   ATRIAL FLUTTER/FIG:  Warfarin to resume.   HTN:   BP OK.  Continue current.    DM:  Sugars are not well controlled.   A1C is 8.5.   I will restart Amaryl and Glucophage.  Victoza is not on formulary and can be held.  As an outpatient he needs continued follow up with LITTLE,Cathryne Mancebo, MD .    DYSLIPIDEMIA:  Triglycerides elevated secondary to poorly controlled DM.  Will address as above.    Signed, Minus Breeding, MD  08/27/2016, 10:28 AM

## 2016-08-27 NOTE — Progress Notes (Signed)
Patient c/o SOB.  Respiratory notified and states will assess. Will continue to monitor.

## 2016-08-28 ENCOUNTER — Encounter (HOSPITAL_COMMUNITY): Payer: Self-pay | Admitting: Nurse Practitioner

## 2016-08-28 ENCOUNTER — Inpatient Hospital Stay (HOSPITAL_COMMUNITY): Payer: BLUE CROSS/BLUE SHIELD

## 2016-08-28 ENCOUNTER — Observation Stay (HOSPITAL_BASED_OUTPATIENT_CLINIC_OR_DEPARTMENT_OTHER): Payer: BLUE CROSS/BLUE SHIELD

## 2016-08-28 DIAGNOSIS — G4733 Obstructive sleep apnea (adult) (pediatric): Secondary | ICD-10-CM | POA: Diagnosis not present

## 2016-08-28 DIAGNOSIS — R079 Chest pain, unspecified: Secondary | ICD-10-CM | POA: Insufficient documentation

## 2016-08-28 DIAGNOSIS — I200 Unstable angina: Secondary | ICD-10-CM | POA: Diagnosis not present

## 2016-08-28 DIAGNOSIS — R9431 Abnormal electrocardiogram [ECG] [EKG]: Secondary | ICD-10-CM | POA: Diagnosis not present

## 2016-08-28 DIAGNOSIS — I119 Hypertensive heart disease without heart failure: Secondary | ICD-10-CM | POA: Diagnosis not present

## 2016-08-28 DIAGNOSIS — G4730 Sleep apnea, unspecified: Secondary | ICD-10-CM | POA: Diagnosis not present

## 2016-08-28 LAB — CBC
HEMATOCRIT: 38.6 % — AB (ref 39.0–52.0)
HEMOGLOBIN: 12.5 g/dL — AB (ref 13.0–17.0)
MCH: 28.2 pg (ref 26.0–34.0)
MCHC: 32.4 g/dL (ref 30.0–36.0)
MCV: 87.1 fL (ref 78.0–100.0)
Platelets: 132 10*3/uL — ABNORMAL LOW (ref 150–400)
RBC: 4.43 MIL/uL (ref 4.22–5.81)
RDW: 14.9 % (ref 11.5–15.5)
WBC: 4.8 10*3/uL (ref 4.0–10.5)

## 2016-08-28 LAB — GLUCOSE, CAPILLARY
GLUCOSE-CAPILLARY: 227 mg/dL — AB (ref 65–99)
Glucose-Capillary: 261 mg/dL — ABNORMAL HIGH (ref 65–99)

## 2016-08-28 LAB — NM MYOCAR MULTI W/SPECT W/WALL MOTION / EF
CHL CUP RESTING HR STRESS: 65 {beats}/min
CSEPED: 0 min
CSEPEDS: 0 s
CSEPHR: 50 %
CSEPPHR: 82 {beats}/min
Estimated workload: 1 METS
MPHR: 162 {beats}/min

## 2016-08-28 LAB — ECHOCARDIOGRAM COMPLETE
CHL CUP DOP CALC LVOT VTI: 32 cm
CHL CUP RV SYS PRESS: 42 mmHg
CHL CUP TV REG PEAK VELOCITY: 292 cm/s
E/e' ratio: 9.11
EWDT: 229 ms
FS: 38 % (ref 28–44)
Height: 75 in
IVS/LV PW RATIO, ED: 1.11
LA ID, A-P, ES: 49 mm
LA diam end sys: 49 mm
LA diam index: 1.98 cm/m2
LA vol A4C: 73.3 ml
LA vol index: 35 mL/m2
LA vol: 86.4 mL
LDCA: 3.46 cm2
LV E/e'average: 9.11
LV TDI E'LATERAL: 9.36
LV TDI E'MEDIAL: 6.85
LV sys vol index: 17 mL/m2
LVDIAVOL: 102 mL (ref 62–150)
LVDIAVOLIN: 41 mL/m2
LVEEMED: 9.11
LVELAT: 9.36 cm/s
LVOT peak grad rest: 8 mmHg
LVOTD: 21 mm
LVOTPV: 142 cm/s
LVOTSV: 111 mL
LVSYSVOL: 43 mL (ref 21–61)
Lateral S' vel: 16.6 cm/s
MV Dec: 229
MV Peak grad: 3 mmHg
MV pk E vel: 85.3 m/s
MVAP: 3.33 cm2
MVPKAVEL: 57.6 m/s
MVSPHT: 66 ms
PW: 14.3 mm — AB (ref 0.6–1.1)
Simpson's disk: 58
Stroke v: 59 ml
TAPSE: 26.2 mm
TR max vel: 292 cm/s
VTI: 193 cm
Weight: 3982.4 oz

## 2016-08-28 LAB — PROTIME-INR
INR: 1.54
Prothrombin Time: 18.6 seconds — ABNORMAL HIGH (ref 11.4–15.2)

## 2016-08-28 MED ORDER — ISOSORBIDE MONONITRATE ER 30 MG PO TB24: 30.0000 mg | ORAL_TABLET | Freq: Every day | ORAL | 6 refills | Status: DC

## 2016-08-28 MED ORDER — TECHNETIUM TC 99M TETROFOSMIN IV KIT
10.0000 | PACK | Freq: Once | INTRAVENOUS | Status: AC | PRN
  Administered 2016-08-28: 10 via INTRAVENOUS

## 2016-08-28 MED ORDER — REGADENOSON 0.4 MG/5ML IV SOLN
INTRAVENOUS | Status: AC
  Filled 2016-08-28: qty 5

## 2016-08-28 MED ORDER — TECHNETIUM TC 99M TETROFOSMIN IV KIT
30.0000 | PACK | Freq: Once | INTRAVENOUS | Status: AC | PRN
  Administered 2016-08-28: 30 via INTRAVENOUS

## 2016-08-28 MED ORDER — REGADENOSON 0.4 MG/5ML IV SOLN
0.4000 mg | Freq: Once | INTRAVENOUS | Status: AC
  Administered 2016-08-28: 0.4 mg via INTRAVENOUS
  Filled 2016-08-28: qty 5

## 2016-08-28 MED ORDER — FUROSEMIDE 40 MG PO TABS
40.0000 mg | ORAL_TABLET | Freq: Every day | ORAL | Status: DC
  Filled 2016-08-28: qty 1

## 2016-08-28 MED ORDER — NITROGLYCERIN 0.4 MG SL SUBL: 0.4000 mg | SUBLINGUAL_TABLET | SUBLINGUAL | 3 refills | Status: DC | PRN

## 2016-08-28 MED ORDER — WARFARIN SODIUM 7.5 MG PO TABS: 7.5000 mg | ORAL_TABLET | Freq: Once | ORAL | Status: DC

## 2016-08-28 NOTE — Progress Notes (Signed)
Progress Note  Patient Name: [name] Date of Encounter: 08/28/2016  Primary Cardiologist: J. Avant Printy, MD   Subjective   No further c/p or sob.  For lexiscan mv today.  Inpatient Medications    Scheduled Meds: . regadenoson      . [START ON 08/29/2016] aspirin  81 mg Oral Pre-Cath  . aspirin EC  81 mg Oral Daily  . bisoprolol  10 mg Oral Daily  . cholecalciferol  1,000 Units Oral Daily  . diltiazem  120 mg Oral Daily  . glimepiride  1 mg Oral Q breakfast  . insulin aspart  0-5 Units Subcutaneous QHS  . insulin aspart  0-9 Units Subcutaneous TID WC  . isosorbide mononitrate  30 mg Oral Daily  . lisinopril  40 mg Oral Daily  . metFORMIN  1,000 mg Oral BID WC  . multivitamin with minerals  1 tablet Oral Daily  . pantoprazole  40 mg Oral Daily  . potassium chloride  10 mEq Oral Daily  . pravastatin  80 mg Oral QHS  . regadenoson  0.4 mg Intravenous Once  . sodium chloride flush  3 mL Intravenous Q12H  . warfarin  7.5 mg Oral ONCE-1800  . Warfarin - Pharmacist Dosing Inpatient   Does not apply q1800   Continuous Infusions: . sodium chloride    . [START ON 08/29/2016] sodium chloride     Followed by  . [START ON 08/29/2016] sodium chloride     PRN Meds: sodium chloride, acetaminophen, albuterol, ALPRAZolam, furosemide, HYDROcodone-acetaminophen, nitroGLYCERIN, ondansetron (ZOFRAN) IV, sodium chloride flush, zolpidem   Vital Signs    Vitals:   08/27/16 1628 08/27/16 2013 08/28/16 0504 08/28/16 0927  BP: (!) 141/65 (!) 152/69 (!) 151/69 (!) 190/84  Pulse: 62 69 66 67  Resp: 20 20 20    Temp: 98.6 F (37 C) 98.3 F (36.8 C) 98.6 F (37 C)   TempSrc: Oral Oral Oral   SpO2: 95% 96% 95%   Weight:   248 lb 14.4 oz (112.9 kg)   Height:        Intake/Output Summary (Last 24 hours) at 08/28/16 1009 Last data filed at 08/28/16 0505  Gross per 24 hour  Intake              480 ml  Output              950 ml  Net             -470 ml   Filed Weights   08/26/16  1454 08/27/16 0555 08/28/16 0504  Weight: 251 lb 8 oz (114.1 kg) 250 lb 9.6 oz (113.7 kg) 248 lb 14.4 oz (112.9 kg)    Physical Exam   GEN: Well nourished, well developed, in no acute distress.  HEENT: Grossly normal.  Neck: Supple, no JVD, carotid bruits, or masses. Cardiac: RRR, no murmurs, rubs, or gallops. No clubbing, cyanosis, trace bilat ankle edema.  Radials/DP/PT 2+ and equal bilaterally.  Respiratory:  Respirations regular and unlabored, clear to auscultation bilaterally. GI: Soft, nontender, nondistended, BS + x 4. MS: no deformity or atrophy. Skin: warm and dry, no rash. Neuro:  Strength and sensation are intact. Psych: AAOx3.  Normal affect.  Labs    Chemistry Recent Labs Lab 08/26/16 1511 08/27/16 0235  NA 138 137  K 3.7 3.9  CL 103 101  CO2 26 29  GLUCOSE 167* 226*  BUN 8 8  CREATININE 0.67 0.65  CALCIUM 8.9 8.7*  PROT 6.1*  --  ALBUMIN 3.5  --   AST 36  --   ALT 28  --   ALKPHOS 108  --   BILITOT 0.6  --   GFRNONAA >60 >60  GFRAA >60 >60  ANIONGAP 9 7     Hematology Recent Labs Lab 08/26/16 1511 08/27/16 0235 08/28/16 0538  WBC 6.2 6.5 4.8  RBC 4.36 4.37 4.43  HGB 12.5* 12.3* 12.5*  HCT 38.5* 38.4* 38.6*  MCV 88.3 87.9 87.1  MCH 28.7 28.1 28.2  MCHC 32.5 32.0 32.4  RDW 14.9 14.8 14.9  PLT 129* 142* 132*    Cardiac Enzymes Recent Labs Lab 08/26/16 1511 08/26/16 1816 08/27/16 0235  TROPONINI <0.03 <0.03 <0.03   Lab Results  Component Value Date   INR 1.54 08/28/2016   INR 2.12 08/27/2016   INR 2.36 08/26/2016    Radiology    No results found.  Telemetry    Seen in nuclear medicine - sinus rhythm while monitored here - Personally Reviewed  ECG    rsr, 66, non-specific t changes - Personally Reviewed  Cardiac Studies   Lexiscan Cardiolite pending  Patient Profile     61 y.o. male who was admitted for evaluation of chest pain with EKG changes. He has a history of atrial flutter and atrial fibrillation.  He is  status post flutter ablation.  He was not a candidate for amio or flecainide. He was on Multaq for awhile but now has been taken off of that because his atrial fib burden was low.   He had normal coronaries on cath in 2008.  Perfusion study was negative in 2014.    He report that his chest pain was on Monday.  He came to the clinic on Friday and was noted to have new T wave inversion on his EKG with ongoing intermittent pain and progressive dyspnea.   Assessment & Plan    1.  [place]:  Admitted 4/13 with chest pain and has since r/o.  No further chest pain.  For myoview today.  Plan d/c this afternoon if no ischemia.  2.  Afib/flutter:  Maintaining sinus on  blocker, dilt.  Anticoagulated w/ coumadin.  INR subRx.  3.  Essential HTN:  bp markedly elevated this am after trending 140's - 150's overnight.  Has not yet received am meds.  He is on bisoprolol, dilt 120, lisinopril 40, and imdur 30.  Renal fxn stable.  He is on lasix @ home, that is not ordered here.  Resume.  4.  DM II:  A1c 8.5.  amaryl and metformin resumed yesterday.  Glucose 132 this am.  Will need outpt f/u.  Also on victoza as outpt.  5.  HL/HTG:  ldl 58 on pravastatin.  TG 330 in setting of poorly controlled DM.  Will need to consider addition of fibrate.  Signed, Murray Hodgkins, NP  08/28/2016, 10:09 AM    History and all data above reviewed.  Patient examined.  I agree with the findings as above. No further chest pain.  No SOB.  The patient exam reveals COR:RRR  ,  Lungs: Clear  ,  Abd: Positive bowel sounds, no rebound no guarding, Ext No edema  .  All available labs, radiology testing, previous records reviewed. Agree with documented assessment and plan.   Possible Unstable Angina:  Home if Lexiscan Myoview negative.  BP is elevated but we will follow with BP diary.  Needs to follow with [name], MD for better DM control.   [name]  12:20  PM  08/28/2016

## 2016-08-28 NOTE — Progress Notes (Signed)
ANTICOAGULATION CONSULT NOTE - Follow Up Consult  Pharmacy Consult for Warfarin Indication: atrial fibrillation  Assessment: 57 YOM with AFib on warfarin PTA (home dose 7.5mg  daily except 5mg  on TTSaturday), INR on admission 2.36 with CHADSVASC of 2. Pharmacy consulted to manage warfarin while patient is admitted. Warfarin dose held on 4/13 in anticipation of cath procedure, however cardiology is no longer planning for a cath and warfarin was resumed 4/14.   Today's INR is 1.54 (subtherapeutic), no bridge needed per Dr. Percival Spanish. Hemoglobin 12.5, platelets 132, no overt bleeding noted.   Goal of Therapy:  INR 2-3 Monitor platelets by anticoagulation protocol: Yes   Plan:  Warfarin 7.5 mg x 1 tonight Monitor daily INR and s/sx's bleeding Follow-up Lexiscan   Allergies  Allergen Reactions  . Adhesive [Tape]     itching  . Latex Itching    When tape is on the skin too long skin gets red & itching    Patient Measurements: Height: 6\' 3"  (190.5 cm) Weight: 248 lb 14.4 oz (112.9 kg) IBW/kg (Calculated) : 84.5  Vital Signs: Temp: 98.6 F (37 C) (04/15 0504) Temp Source: Oral (04/15 0504) BP: 151/69 (04/15 0504) Pulse Rate: 66 (04/15 0504)  Labs:  Recent Labs  08/26/16 1511 08/26/16 1816 08/27/16 0235 08/28/16 0538  HGB 12.5*  --  12.3* 12.5*  HCT 38.5*  --  38.4* 38.6*  PLT 129*  --  142* 132*  LABPROT 26.2*  --  24.1* 18.6*  INR 2.36  --  2.12 1.54  CREATININE 0.67  --  0.65  --   TROPONINI <0.03 <0.03 <0.03  --     Estimated Creatinine Clearance: 136.5 mL/min (by C-G formula based on SCr of 0.65 mg/dL).  Medications:  Scheduled:  . regadenoson      . [START ON 08/29/2016] aspirin  81 mg Oral Pre-Cath  . aspirin EC  81 mg Oral Daily  . bisoprolol  10 mg Oral Daily  . cholecalciferol  1,000 Units Oral Daily  . diltiazem  120 mg Oral Daily  . glimepiride  1 mg Oral Q breakfast  . insulin aspart  0-5 Units Subcutaneous QHS  . insulin aspart  0-9 Units  Subcutaneous TID WC  . isosorbide mononitrate  30 mg Oral Daily  . lisinopril  40 mg Oral Daily  . metFORMIN  1,000 mg Oral BID WC  . multivitamin with minerals  1 tablet Oral Daily  . pantoprazole  40 mg Oral Daily  . potassium chloride  10 mEq Oral Daily  . pravastatin  80 mg Oral QHS  . regadenoson  0.4 mg Intravenous Once  . sodium chloride flush  3 mL Intravenous Q12H  . warfarin  7.5 mg Oral ONCE-1800  . Warfarin - Pharmacist Dosing Inpatient   Does not apply [id number]    [name], PharmD PGY1 Pharmacy Resident [phone] (Pager) 08/28/2016 9:10 AM

## 2016-08-28 NOTE — Discharge Instructions (Signed)
**  PLEASE REMEMBER TO BRING ALL OF YOUR MEDICATIONS TO EACH OF YOUR FOLLOW-UP OFFICE VISITS. ° °  ° °10 Habits of Highly Healthy People ° °[facility] wants to help you get well and stay well.  Live a longer, healthier life by practicing healthy habits every day. ° °1.  Visit your primary care provider regularly. °2.  Make time for family and friends.  Healthy relationships are important. °3.  Take medications as directed by your provider. °4.  Maintain a healthy weight and a trim waistline. °5.  Eat healthy meals and snacks, rich in fruits, vegetables, whole grains, and lean proteins. °6.  Get moving every day - aim for 150 minutes of moderate physical activity each week. °7.  Don't smoke. °8.  Avoid alcohol or drink in moderation. °9.  Manage stress through meditation or mindful relaxation. °10.  Get seven to nine hours of quality sleep each night. ° °Want more information on healthy habits?  To learn more about these and other healthy habits, visit [email or web address]. °_____________ °  ° ° °

## 2016-08-28 NOTE — Progress Notes (Signed)
PT discharged home, IV and tele removed. Questions answered. Requested to walk out to private vehicle with family. No c/o of pain.

## 2016-08-28 NOTE — Progress Notes (Signed)
Pt off the floor to [facility]. CCMD notified

## 2016-08-28 NOTE — Discharge Summary (Signed)
Discharge Summary    Patient ID: [name],  MRN: [id number], DOB/AGE: 12/28/1955 61 y.o.  Admit date: 08/26/2016 Discharge date: 08/28/2016  Primary Care Provider: [facility] Primary Cardiologist: J. Hochrein, MD   Discharge Diagnoses    Principal Problem:   Unstable angina (HCC)  **Negative Lexiscan Myoview this admission  Active Problems:   Essential hypertension   COPD (chronic obstructive pulmonary disease) (HCC)   PAF (paroxysmal atrial fibrillation) (HCC)   Tobacco abuse   Obesity (BMI 30-39.9)   OSA (obstructive sleep apnea)   Long term current use of anticoagulant therapy   Dyslipidemia  Allergies Allergies  Allergen Reactions  . Adhesive [Tape]     itching  . Latex Itching    When tape is on the skin too long skin gets red & itching    Diagnostic Studies/Procedures    Lexiscan Cardiolite 4.15.2018  IMPRESSION: 1. No reversible ischemia or infarction. 2. Left ventricular dilatation with end-diastolic volume of 456 cc mL and end systolic volume of 92 mL. Normal left ventricular wall motion. 3. Left ventricular ejection fraction 54% 4. Non invasive risk stratification*: Low _____________   2D Echocardiogram 4.15.2018  Study Conclusions  - Left ventricle: The cavity size was normal. Wall thickness was   increased in a pattern of moderate LVH. Systolic function was   normal. The estimated ejection fraction was in the range of 55%   to 60%. Wall motion was normal; there were no regional wall   motion abnormalities. - Mitral valve: Mildly calcified annulus. - Left atrium: The atrium was mildly dilated. - Right atrium: The atrium was moderately dilated. - Pulmonary arteries: Systolic pressure was moderately increased.   PA peak pressure: 42 mm Hg (S). _____________   History of Present Illness     61 year old male with a prior history of atrial fibrillation and flutter status post atrial flutter ablation, hypertension, tobacco abuse,  COPD, obesity, hyperlipidemia, and sleep apnea. He was seen in clinic on April 13 after recent episodes of substernal chest discomfort with increasing fatigue and dyspnea. ECG showed sinus rhythm with a new lateral T-wave inversions. There was concern for acute coronary syndrome and he was admitted for further evaluation.  Hospital Course     Consultants: None   Patient ruled out for myocardial infarction and had no further chest pain following admission. He underwent Lexiscan Myoview stress testing this morning which showed no evidence of infarct or ischemia. LV function was normal. Echo currently gram was also performed showing normal LV function with moderate LVH. There were no regional wall motion abnormalities. PA systolic pressure was moderately elevated at 42 mmHg. He has been ambulatory without recurrent symptoms or limitations and will be discharged home today in good condition. We have noted hyperglycemia during admission with a hemoglobin A1c of 8.5 and have recommended close outpatient follow-up with his primary care provider. He'll be seen back in Coumadin clinic next week and we have arranged for outpatient follow-up in our office in approximately 3 weeks. _____________  Discharge Vitals Blood pressure (!) 146/101, pulse 66, temperature 98.5 F (36.9 C), temperature source Oral, resp. rate 20, height 6\' 3"  (1.905 m), weight 248 lb 14.4 oz (112.9 kg), SpO2 93 %.  Filed Weights   08/26/16 1454 08/27/16 0555 08/28/16 0504  Weight: 251 lb 8 oz (114.1 kg) 250 lb 9.6 oz (113.7 kg) 248 lb 14.4 oz (112.9 kg)    Labs & Radiologic Studies    CBC  Recent Labs  08/26/16 1511  08/27/16 0235 08/28/16 0538  WBC 6.2 6.5 4.8  NEUTROABS 4.2  --   --   HGB 12.5* 12.3* 12.5*  HCT 38.5* 38.4* 38.6*  MCV 88.3 87.9 87.1  PLT 129* 142* 588*   Basic Metabolic Panel  Recent Labs  08/26/16 1511 08/27/16 0235  NA 138 137  K 3.7 3.9  CL 103 101  CO2 26 29  GLUCOSE 167* 226*  BUN 8 8    CREATININE 0.67 0.65  CALCIUM 8.9 8.7*  MG 1.8  --    Liver Function Tests  Recent Labs  08/26/16 1511  AST 36  ALT 28  ALKPHOS 108  BILITOT 0.6  PROT 6.1*  ALBUMIN 3.5   Cardiac Enzymes  Recent Labs  08/26/16 1511 08/26/16 1816 08/27/16 0235  TROPONINI <0.03 <0.03 <0.03   Hemoglobin A1C  Recent Labs  08/26/16 1511  HGBA1C 8.5*   Fasting Lipid Panel  Recent Labs  08/27/16 0235  CHOL 153  HDL 29*  LDLCALC 58  TRIG 330*  CHOLHDL 5.3   Thyroid Function Tests  Recent Labs  08/26/16 1511  TSH 2.519   _____________  Nm Myocar Multi W/spect W/wall Motion / Ef  Result Date: 08/28/2016 CLINICAL DATA:  61 year old male with chest pain. EXAM: MYOCARDIAL IMAGING WITH SPECT (REST AND PHARMACOLOGIC-STRESS) GATED LEFT VENTRICULAR WALL MOTION STUDY LEFT VENTRICULAR EJECTION FRACTION TECHNIQUE: Standard myocardial SPECT imaging was performed after resting intravenous injection of 10 mCi Tc-94m tetrofosmin. Subsequently, intravenous infusion of Lexiscan was performed under the supervision of the Cardiology staff. At peak effect of the drug, 30 mCi Tc-70m tetrofosmin was injected intravenously and standard myocardial SPECT imaging was performed. Quantitative gated imaging was also performed to evaluate left ventricular wall motion, and estimate left ventricular ejection fraction. COMPARISON:  None. FINDINGS: Perfusion: No decreased activity in the left ventricle on stress imaging to suggest reversible ischemia or infarction. Wall Motion: Normal left ventricular wall motion. Left ventricular dilatation identified. Left Ventricular Ejection Fraction: 54 % End diastolic volume 325 ml End systolic volume 92 ml IMPRESSION: 1. No reversible ischemia or infarction. 2. Left ventricular dilatation with end-diastolic volume of 498 cc mL and end systolic volume of 92 mL. Normal left ventricular wall motion. 3. Left ventricular ejection fraction 54% 4. Non invasive risk stratification*: Low  *2012 Appropriate Use Criteria for Coronary Revascularization Focused Update: J Am Coll Cardiol. 2641;58(3):094-076. http://content.airportbarriers.com.aspx?articleid=[email or web address] Electronically Signed   By: Margarette Canada M.D.   On: 08/28/2016 12:21   Disposition   Pt is being discharged home today in good condition.  Follow-up Plans & Appointments    Follow-up Information    Kerin Ransom, [place] Follow up on 09/14/2016.   Specialties:  Cardiology, Radiology Why:  8:00 AM - Dr. Rosezella Florida PA Contact information: [address] [place] [address] [phone]        [facility] Follow up on 09/05/2016.   Why:  coumadin clinic - 7:45 am Contact information: [place] [address] [phone]          Discharge Medications   Current Discharge Medication List    START taking these medications   Details  isosorbide mononitrate (IMDUR) 30 MG 24 hr tablet Take 1 tablet (30 mg total) by mouth daily. Qty: 30 tablet, Refills: 6    nitroGLYCERIN (NITROSTAT) 0.4 MG SL tablet Place 1 tablet (0.4 mg total) under the tongue every 5 (five) minutes x 3 doses as needed for chest pain. Qty:  25 tablet, Refills: 3      CONTINUE these medications which have NOT CHANGED   Details  albuterol (PROVENTIL HFA;VENTOLIN HFA) 108 (90 BASE) MCG/ACT inhaler Inhale 2 puffs into the lungs every 6 (six) hours as needed for wheezing or shortness of breath.    bisoprolol (ZEBETA) 10 MG tablet Take 10 mg by mouth daily.    Cholecalciferol (VITAMIN D) 2000 UNITS CAPS Take 1 capsule by mouth daily.    diltiazem (CARDIZEM CD) 120 MG 24 hr capsule Take 120 mg by mouth daily.    diltiazem (CARDIZEM) 60 MG tablet Take 60 mg by mouth daily as needed (ATRIAL FIB).    furosemide (LASIX) 40 MG tablet Take 1 tablet (40 mg total) by mouth daily as needed for fluid. Qty: 30 tablet, Refills: 2   Associated Diagnoses:  Dyspnea; S/P ablation operation for arrhythmia    glimepiride (AMARYL) 1 MG tablet Take 1 mg by mouth daily before breakfast.     HYDROcodone-acetaminophen (NORCO/VICODIN) 5-325 MG per tablet Take 1 tablet by mouth every 6 (six) hours as needed for pain.    Liraglutide (VICTOZA) 18 MG/3ML SOPN Inject 12 mLs into the skin daily.     lisinopril (PRINIVIL,ZESTRIL) 40 MG tablet TAKE 1 TABLET BY MOUTH ONCE DAILY Qty: 90 tablet, Refills: 3    metFORMIN (GLUCOPHAGE) 1000 MG tablet Take 1,000 mg by mouth 2 (two) times daily with a meal.    Multiple Vitamin (MULTIVITAMIN WITH MINERALS) TABS Take 1 tablet by mouth daily.    omeprazole (PRILOSEC) 20 MG capsule Take 20 mg by mouth 2 (two) times daily.     potassium chloride (KLOR-CON 10) 10 MEQ tablet Take 10 mEq by mouth daily.     pravastatin (PRAVACHOL) 80 MG tablet TAKE 1 TABLET BY MOUTH EVERY NIGHT AT BEDTIME Qty: 30 tablet, Refills: 11    warfarin (COUMADIN) 5 MG tablet Take 1 to 1 and 1/2 tablets every day as directed by coumadin clinic Qty: 40 tablet, Refills: 2         Outstanding Labs/Studies   F/U INR as scheduled.  Duration of Discharge Encounter   Greater than 30 minutes including physician time.  Signed, Murray Hodgkins NP 08/28/2016, 2:42 PM

## 2016-08-28 NOTE — Progress Notes (Signed)
*  PRELIMINARY RESULTS* Echocardiogram 2D Echocardiogram has been performed.  Samuel Germany 08/28/2016, 12:23 PM

## 2016-08-29 ENCOUNTER — Encounter (HOSPITAL_COMMUNITY): Admission: RE | Payer: Self-pay | Source: Ambulatory Visit

## 2016-08-29 ENCOUNTER — Ambulatory Visit (HOSPITAL_COMMUNITY): Admission: RE | Admit: 2016-08-29 | Payer: BLUE CROSS/BLUE SHIELD | Source: Ambulatory Visit | Admitting: Cardiology

## 2016-08-29 SURGERY — LEFT HEART CATH AND CORONARY ANGIOGRAPHY
Anesthesia: LOCAL

## 2016-08-29 NOTE — Telephone Encounter (Signed)
Pt had an appt with Dr Percival Spanish 04/13

## 2016-09-02 ENCOUNTER — Telehealth: Payer: Self-pay | Admitting: Cardiology

## 2016-09-02 NOTE — Telephone Encounter (Signed)
New message     Pt c/o BP issue: STAT if pt c/o blurred vision, one-sided weakness or slurred speech  1. What are your last 5 BP readings? 180/90  2. Are you having any other symptoms (ex. Dizziness, headache, blurred vision, passed out)?  Sometimes heart feels "weird"  3. What is your BP issue?  bp is still high.  Pt started new bp medication wednesday

## 2016-09-02 NOTE — Telephone Encounter (Signed)
Returned the phone call to the patient. He was recently discharged from the hospital for chest pain with a negative lexiscan myoview. He stated that his blood pressure has been 180/90 with a heart rate of 80 and that he has on and off chest pressure. He stated that it does not feel like he is in atrial fibrillation. He states that the pressure "calms down" when he sits down and relaxes. He did say that his mother, who lives with him, fell yesterday and had to go to the hospital by EMS so he is very stressed right now. He was informed, per the DOD, to take an extra bisoprolol 10 mg. He has an appointment to be seen on 09/14/16. He was also informed that if he feels worse or develops chest pain that he should go to the ED. He verbalized his understanding.

## 2016-09-05 ENCOUNTER — Ambulatory Visit (INDEPENDENT_AMBULATORY_CARE_PROVIDER_SITE_OTHER): Payer: BLUE CROSS/BLUE SHIELD | Admitting: *Deleted

## 2016-09-05 ENCOUNTER — Other Ambulatory Visit: Payer: Self-pay | Admitting: Cardiology

## 2016-09-05 DIAGNOSIS — I4891 Unspecified atrial fibrillation: Secondary | ICD-10-CM | POA: Diagnosis not present

## 2016-09-05 DIAGNOSIS — I480 Paroxysmal atrial fibrillation: Secondary | ICD-10-CM

## 2016-09-05 DIAGNOSIS — Z5181 Encounter for therapeutic drug level monitoring: Secondary | ICD-10-CM | POA: Diagnosis not present

## 2016-09-05 LAB — POCT INR: INR: 1.8

## 2016-09-14 ENCOUNTER — Ambulatory Visit (INDEPENDENT_AMBULATORY_CARE_PROVIDER_SITE_OTHER): Payer: BLUE CROSS/BLUE SHIELD | Admitting: Cardiology

## 2016-09-14 ENCOUNTER — Other Ambulatory Visit: Payer: Self-pay | Admitting: *Deleted

## 2016-09-14 ENCOUNTER — Encounter: Payer: Self-pay | Admitting: Cardiology

## 2016-09-14 VITALS — BP 160/80 | HR 76 | Ht 75.0 in | Wt 254.0 lb

## 2016-09-14 DIAGNOSIS — R079 Chest pain, unspecified: Secondary | ICD-10-CM | POA: Diagnosis not present

## 2016-09-14 DIAGNOSIS — E119 Type 2 diabetes mellitus without complications: Secondary | ICD-10-CM

## 2016-09-14 DIAGNOSIS — Z720 Tobacco use: Secondary | ICD-10-CM

## 2016-09-14 DIAGNOSIS — J410 Simple chronic bronchitis: Secondary | ICD-10-CM

## 2016-09-14 DIAGNOSIS — I4811 Longstanding persistent atrial fibrillation: Secondary | ICD-10-CM

## 2016-09-14 DIAGNOSIS — Z01810 Encounter for preprocedural cardiovascular examination: Secondary | ICD-10-CM | POA: Diagnosis not present

## 2016-09-14 DIAGNOSIS — I481 Persistent atrial fibrillation: Secondary | ICD-10-CM

## 2016-09-14 DIAGNOSIS — Z7901 Long term (current) use of anticoagulants: Secondary | ICD-10-CM | POA: Diagnosis not present

## 2016-09-14 LAB — CBC
HCT: 40.1 % (ref 38.5–50.0)
Hemoglobin: 13.2 g/dL (ref 13.2–17.1)
MCH: 28.6 pg (ref 27.0–33.0)
MCHC: 32.9 g/dL (ref 32.0–36.0)
MCV: 87 fL (ref 80.0–100.0)
MPV: 10.9 fL (ref 7.5–12.5)
Platelets: 136 10*3/uL — ABNORMAL LOW (ref 140–400)
RBC: 4.61 MIL/uL (ref 4.20–5.80)
RDW: 15.6 % — ABNORMAL HIGH (ref 11.0–15.0)
WBC: 5.5 10*3/uL (ref 3.8–10.8)

## 2016-09-14 LAB — BASIC METABOLIC PANEL
BUN: 9 mg/dL (ref 7–25)
CO2: 27 mmol/L (ref 20–31)
Calcium: 9 mg/dL (ref 8.6–10.3)
Chloride: 102 mmol/L (ref 98–110)
Creat: 0.69 mg/dL — ABNORMAL LOW (ref 0.70–1.33)
Glucose, Bld: 211 mg/dL — ABNORMAL HIGH (ref 65–99)
Potassium: 4.2 mmol/L (ref 3.5–5.3)
Sodium: 141 mmol/L (ref 135–146)

## 2016-09-14 NOTE — Assessment & Plan Note (Signed)
He was in NSR 08/28/16 and is in NSR on exam

## 2016-09-14 NOTE — Assessment & Plan Note (Signed)
Trying to quit 

## 2016-09-14 NOTE — Assessment & Plan Note (Signed)
Pt continues to have exertional chest discomfort and DOE- this is new since his initial episode 2 weeks ago

## 2016-09-14 NOTE — Patient Instructions (Addendum)
Your physician has requested that you have a cardiac catheterization. Cardiac catheterization is used to diagnose and/or treat various heart conditions. Doctors may recommend this procedure for a number of different reasons. The most common reason is to evaluate chest pain. Chest pain can be a symptom of coronary artery disease (CAD), and cardiac catheterization can show whether plaque is narrowing or blocking your heart's arteries. This procedure is also used to evaluate the valves, as well as measure the blood flow and oxygen levels in different parts of your heart. For further information please visit [facility]iest[facility][email or web address]. Please follow instruction sheet, as given.          [facility] [address] [place] [address] Dept: [phone] Loc: [phone]  REVIS WHALIN  09/14/2016  You are scheduled for a Cardiac Catheterization on Monday, May 7 with Dr. Daneen Schick.  1. Please arrive at the [facility] (Main Entrance A) at [facility]: [address] [place], [place] [address] at 5:30 AM (two hours before your procedure to ensure your preparation). Free valet parking service is available.   Special note: Every effort is made to have your procedure done on time. Please understand that emergencies sometimes delay scheduled procedures.  2. Diet: Do not eat or drink anything after midnight prior to your procedure except sips of water to take medications.  3. Labs: You will need to have blood drawn on Wednesday, May 2 at [place], [place]  Open: [place] (Lunch 12:30 - 1:30)   Phone: [phone]. You do not need to be fasting.  4. Medication instructions in preparation for your procedure:  HOLD coumadin 3 days before. HOLD glucophage the day of the procedure & 2 days afterward.    On the morning of your procedure, take any morning medicines NOT listed  above.  You may use sips of water.  5. Plan for one night stay--bring personal belongings. 6. Bring a current list of your medications and current insurance cards. 7. You MUST have a responsible person to drive you home. 8. Someone MUST be with you the first 24 hours after you arrive home or your discharge will be delayed. 9. Please wear clothes that are easy to get on and off and wear slip-on shoes.  Thank you for allowing [place] to care for you!   -- [facility]   Follow up in office 2 weeks after cath with Dr. Percival Spanish or Lurena Joiner

## 2016-09-14 NOTE — Progress Notes (Signed)
09/14/2016 [name]   15-Jun-1955  [id number]  Primary Physician [name], MD Primary Cardiologist: Dr Percival Spanish  HPI:  61 year old male with a prior history of atrial fibrillation and flutter status post atrial flutter ablation 2014, hypertension, tobacco abuse, COPD, obesity, hyperlipidemia, and sleep apnea (unable to tolerate C-pap). He was seen in clinic on April 13 after an episode of substernal chest discomfort that radiated to his back while he was helping his wife out of the car. This was associated with increasing fatigue and dyspnea. He went to see his PCP the next day and his ECG showed sinus rhythm with a new lateral T-wave inversions. He was seen ;later that week in our office. There was concern for acute coronary syndrome and he was admitted 08/26/16 for further evaluation.   Patient ruled out for myocardial infarction and had no further chest pain following admission. He underwent Lexiscan Myoview stress testing this morning which showed no evidence of infarct or ischemia. LV function was normal. Echo currently gram was also performed showing normal LV function with moderate LVH. There were no regional wall motion abnormalities. PA systolic pressure was moderately elevated at 42 mmHg.   He is in the office today for follow up. He continues to have exertional symptoms of chest discomfort and fatigue. He says this is definitely a change from his activity level prior to this las episode of chest pain. He says his symptoms are worse when he is using his arms. He says he now has to stop and rest when walking from his shop to the house.    Current Outpatient Prescriptions  Medication Sig Dispense Refill  . albuterol (PROVENTIL HFA;VENTOLIN HFA) 108 (90 BASE) MCG/ACT inhaler Inhale 2 puffs into the lungs every 6 (six) hours as needed for wheezing or shortness of breath.    . bisoprolol (ZEBETA) 10 MG tablet Take 10 mg by mouth daily.    . Cholecalciferol (VITAMIN D) 2000 UNITS  CAPS Take 1 capsule by mouth daily.    [name] diltiazem (CARDIZEM CD) 120 MG 24 hr capsule Take 120 mg by mouth daily.    [name] diltiazem (CARDIZEM CD) 120 MG 24 hr capsule TAKE 1 CAPSULE BY MOUTH DAILY 30 capsule 0  . furosemide (LASIX) 40 MG tablet Take 1 tablet (40 mg total) by mouth daily as needed for fluid. 30 tablet 2  . glimepiride (AMARYL) 1 MG tablet Take 1 mg by mouth daily before breakfast.     . HYDROcodone-acetaminophen (NORCO/VICODIN) 5-325 MG per tablet Take 1 tablet by mouth every 6 (six) hours as needed for pain.    . isosorbide mononitrate (IMDUR) 30 MG 24 hr tablet Take 1 tablet (30 mg total) by mouth daily. 30 tablet 6  . Liraglutide (VICTOZA) 18 MG/3ML SOPN Inject 12 mLs into the skin daily.     [name] lisinopril (PRINIVIL,ZESTRIL) 40 MG tablet TAKE 1 TABLET BY MOUTH ONCE DAILY 90 tablet 3  . metFORMIN (GLUCOPHAGE) 1000 MG tablet Take 1,000 mg by mouth 2 (two) times daily with a meal.    . Multiple Vitamin (MULTIVITAMIN WITH MINERALS) TABS Take 1 tablet by mouth daily.    . nitroGLYCERIN (NITROSTAT) 0.4 MG SL tablet Place 1 tablet (0.4 mg total) under the tongue every 5 (five) minutes x 3 doses as needed for chest pain. 25 tablet 3  . omeprazole (PRILOSEC) 20 MG capsule Take 20 mg by mouth 2 (two) times daily.     . potassium chloride (KLOR-CON 10) 10 MEQ tablet Take  10 mEq by mouth daily.     . pravastatin (PRAVACHOL) 80 MG tablet TAKE 1 TABLET BY MOUTH EVERY NIGHT AT BEDTIME 30 tablet 11  . sildenafil (VIAGRA) 25 MG tablet Take 25 mg by mouth daily as needed for erectile dysfunction.    [name] warfarin (COUMADIN) 5 MG tablet Take 1 to 1 and 1/2 tablets every day as directed by coumadin clinic (Patient taking differently: 7.5mg  Sun Mon Wed Fri 5mg  Sat Tues Thur) 40 tablet 2   No current facility-administered medications for this visit.     Allergies  Allergen Reactions  . Adhesive [Tape]     itching  . Latex Itching    When tape is on the skin too long skin gets red & itching     Past Medical History:  Diagnosis Date  . Arthritis    lower back  . Asthma   . Atrial flutter ([place])    s/p CTI ablation by Dr Rayann Heman  . Brain aneurysm 2009   questionable. A follow up CTA in 2009 showed no evidence of  . Chest pain    a. 08/2016 Neg MV, EF 54%.  . Chronic back pain    DDD/stenosis  . Colon polyps    9 polyps removed 10/13/11  . Complication of anesthesia September 11, 2012   slow to awaken after ablation  . Diabetes mellitus    takes Metformin and Glimepiride daily  . Emphysema   . GERD (gastroesophageal reflux disease)    takes Omeprazole bid  . History of shingles   . HLD (hyperlipidemia)    takes Pravastatin daily  . HTN (hypertension)    takes Prinizide daily  . Obesity   . OSA (obstructive sleep apnea)    not always using cpap  . Overdose 2009   unintentional Flecanide overdose  . Peripheral neuropathy   . Short-term memory loss   . Tobacco abuse   . Unstable angina ([place]) 08/26/2016    Social History   Social History  . Marital status: Married    Spouse name: N/A  . Number of children: 2  . Years of education: N/A   Occupational History  . Mechanic     Social History Main Topics  . Smoking status: Current Every Day Smoker    Packs/day: 0.30    Years: 40.00    Types: Cigarettes  . Smokeless tobacco: Never Used  . Alcohol use 0.0 oz/week     Comment: couple of times a month liquor, he tells me he typically drinks 2-4 drinks most days but hasnt drank anything in 6 weeks  . Drug use: No  . Sexual activity: Yes   Other Topics Concern  . Not on file   Social History Narrative   Daily caffeine(Mountain Dew)       Lives in [place] with spouse.   Unemployed due to chronic back/ leg pain           Family History  Problem Relation Age of Onset  . Colon cancer Neg Hx   . Anesthesia problems Neg Hx   . Hypotension Neg Hx   . Malignant hyperthermia Neg Hx   . Pseudochol deficiency Neg Hx      Review of Systems: General:  negative for chills, fever, night sweats or weight changes.  Cardiovascular: negative for chest pain, dyspnea on exertion, edema, orthopnea, palpitations, paroxysmal nocturnal dyspnea or shortness of breath Dermatological: negative for rash Respiratory: negative for cough or wheezing Urologic: negative for hematuria Abdominal: negative for  nausea, vomiting, diarrhea, bright red blood per rectum, melena, or hematemesis Neurologic: negative for visual changes, syncope, or dizziness All other systems reviewed and are otherwise negative except as noted above.    Blood pressure (!) 160/80, pulse 76, height 6\' 3"  (1.905 m), weight 254 lb (115.2 kg).  General appearance: alert, cooperative, no distress and moderately obese Neck: no carotid bruit and no JVD Lungs: few rhonchi Rt base Heart: regular rate and rhythm Abdomen: obese, ventral hernia Extremities: no edema Pulses: 2+ and symmetric good radial pulses bilaterally as well as LE pulses Skin: Skin color, texture, turgor normal. No rashes or lesions Neurologic: Grossly normal  EKG NSR, NSST changes, LVH  Echo 08/28/16- Study Conclusions  - Left ventricle: The cavity size was normal. Wall thickness was   increased in a pattern of moderate LVH. Systolic function was   normal. The estimated ejection fraction was in the range of 55%   to 60%. Wall motion was normal; there were no regional wall   motion abnormalities. - Mitral valve: Mildly calcified annulus. - Left atrium: The atrium was mildly dilated. - Right atrium: The atrium was moderately dilated. - Pulmonary arteries: Systolic pressure was moderately increased.   PA peak pressure: 42 mm Hg (S).  Myoview 08/28/16- IMPRESSION: 1. No reversible ischemia or infarction.  2. Left ventricular dilatation with end-diastolic volume of 161 cc mL and end systolic volume of 92 mL. Normal left ventricular wall motion.  3. Left ventricular ejection fraction 54%  4. Non invasive  risk stratification*: Low   ASSESSMENT AND PLAN:   Chest pain with moderate risk of acute coronary syndrome Pt continues to have exertional chest discomfort and DOE- this is new since his initial episode 2 weeks ago  Longstanding persistent atrial fibrillation ([place]) He was in NSR 08/28/16 and is in NSR on exam  Long term current use of anticoagulant therapy Coumadin will be held 3 days pre cath  Non-insulin dependent type 2 diabetes mellitus ([place]) Will hold Glucophage pre cath   Tobacco abuse Trying to quit  COPD (chronic obstructive pulmonary disease) ([place]) Gold B Spirometry 2015 with FEV1 59%, ratio 61    PLAN  Discussed with Dr Martinique- despite recent negative studies he continues to have symptoms concerning for angina. Will arrange OP diagnostic cath.   The patient understands that risks included but are not limited to stroke (1 in 1000), death (1 in 53), kidney failure [usually temporary] (1 in 500), bleeding (1 in 200), allergic reaction [possibly serious] (1 in 200).  The patient understands and agrees to proceed.   Kerin Ransom PA-C 09/14/2016 8:26 AM

## 2016-09-14 NOTE — Assessment & Plan Note (Signed)
Will hold Glucophage pre cath

## 2016-09-14 NOTE — Assessment & Plan Note (Signed)
Gold B Spirometry 2015 with FEV1 59%, ratio 61

## 2016-09-14 NOTE — Assessment & Plan Note (Signed)
Coumadin will be held 3 days pre cath

## 2016-09-15 LAB — PROTIME-INR
INR: 2.2 — ABNORMAL HIGH
Prothrombin Time: 22.8 s — ABNORMAL HIGH (ref 9.0–11.5)

## 2016-09-19 ENCOUNTER — Encounter (HOSPITAL_COMMUNITY): Payer: Self-pay | Admitting: Interventional Cardiology

## 2016-09-19 ENCOUNTER — Encounter (HOSPITAL_COMMUNITY)
Admission: RE | Disposition: A | Discharge status: Home / Self Care | Payer: Self-pay | Source: Ambulatory Visit | Attending: Interventional Cardiology

## 2016-09-19 ENCOUNTER — Ambulatory Visit (HOSPITAL_COMMUNITY)
Admission: RE | Admit: 2016-09-19 | Discharge: 2016-09-19 | Disposition: A | Discharge status: Home / Self Care | Payer: BLUE CROSS/BLUE SHIELD | Source: Ambulatory Visit | Attending: Interventional Cardiology | Admitting: Interventional Cardiology

## 2016-09-19 DIAGNOSIS — I481 Persistent atrial fibrillation: Secondary | ICD-10-CM | POA: Insufficient documentation

## 2016-09-19 DIAGNOSIS — Z7901 Long term (current) use of anticoagulants: Secondary | ICD-10-CM | POA: Insufficient documentation

## 2016-09-19 DIAGNOSIS — E669 Obesity, unspecified: Secondary | ICD-10-CM | POA: Diagnosis not present

## 2016-09-19 DIAGNOSIS — G4733 Obstructive sleep apnea (adult) (pediatric): Secondary | ICD-10-CM | POA: Diagnosis not present

## 2016-09-19 DIAGNOSIS — M479 Spondylosis, unspecified: Secondary | ICD-10-CM | POA: Diagnosis not present

## 2016-09-19 DIAGNOSIS — Z7984 Long term (current) use of oral hypoglycemic drugs: Secondary | ICD-10-CM | POA: Insufficient documentation

## 2016-09-19 DIAGNOSIS — Z91040 Latex allergy status: Secondary | ICD-10-CM | POA: Insufficient documentation

## 2016-09-19 DIAGNOSIS — Z6831 Body mass index (BMI) 31.0-31.9, adult: Secondary | ICD-10-CM | POA: Diagnosis not present

## 2016-09-19 DIAGNOSIS — I10 Essential (primary) hypertension: Secondary | ICD-10-CM | POA: Insufficient documentation

## 2016-09-19 DIAGNOSIS — G8929 Other chronic pain: Secondary | ICD-10-CM | POA: Insufficient documentation

## 2016-09-19 DIAGNOSIS — F17210 Nicotine dependence, cigarettes, uncomplicated: Secondary | ICD-10-CM | POA: Diagnosis not present

## 2016-09-19 DIAGNOSIS — I4892 Unspecified atrial flutter: Secondary | ICD-10-CM | POA: Diagnosis not present

## 2016-09-19 DIAGNOSIS — R0789 Other chest pain: Secondary | ICD-10-CM | POA: Diagnosis not present

## 2016-09-19 DIAGNOSIS — E1142 Type 2 diabetes mellitus with diabetic polyneuropathy: Secondary | ICD-10-CM | POA: Insufficient documentation

## 2016-09-19 DIAGNOSIS — J449 Chronic obstructive pulmonary disease, unspecified: Secondary | ICD-10-CM | POA: Diagnosis not present

## 2016-09-19 DIAGNOSIS — R5383 Other fatigue: Secondary | ICD-10-CM | POA: Diagnosis not present

## 2016-09-19 DIAGNOSIS — E785 Hyperlipidemia, unspecified: Secondary | ICD-10-CM | POA: Insufficient documentation

## 2016-09-19 DIAGNOSIS — K219 Gastro-esophageal reflux disease without esophagitis: Secondary | ICD-10-CM | POA: Diagnosis not present

## 2016-09-19 DIAGNOSIS — R079 Chest pain, unspecified: Secondary | ICD-10-CM | POA: Diagnosis present

## 2016-09-19 DIAGNOSIS — Z720 Tobacco use: Secondary | ICD-10-CM | POA: Diagnosis present

## 2016-09-19 HISTORY — PX: LEFT HEART CATH AND CORONARY ANGIOGRAPHY: CATH118249

## 2016-09-19 LAB — PROTIME-INR
INR: 1.1
PROTHROMBIN TIME: 14.2 s (ref 11.4–15.2)

## 2016-09-19 LAB — GLUCOSE, CAPILLARY: Glucose-Capillary: 222 mg/dL — ABNORMAL HIGH (ref 65–99)

## 2016-09-19 SURGERY — LEFT HEART CATH AND CORONARY ANGIOGRAPHY
Anesthesia: LOCAL

## 2016-09-19 MED ORDER — SODIUM CHLORIDE 0.9 % IV SOLN: 250.0000 mL | INTRAVENOUS | Status: DC | PRN

## 2016-09-19 MED ORDER — ASPIRIN 81 MG PO CHEW
81.0000 mg | CHEWABLE_TABLET | ORAL | Status: AC
  Administered 2016-09-19: 81 mg via ORAL

## 2016-09-19 MED ORDER — HEPARIN (PORCINE) IN NACL 2-0.9 UNIT/ML-% IJ SOLN
INTRAMUSCULAR | Status: AC
  Filled 2016-09-19: qty 1000

## 2016-09-19 MED ORDER — ONDANSETRON HCL 4 MG/2ML IJ SOLN: 4.0000 mg | Freq: Four times a day (QID) | INTRAMUSCULAR | Status: DC | PRN

## 2016-09-19 MED ORDER — IOPAMIDOL (ISOVUE-370) INJECTION 76%
INTRAVENOUS | Status: DC | PRN
  Administered 2016-09-19: 65 mL via INTRA_ARTERIAL

## 2016-09-19 MED ORDER — SODIUM CHLORIDE 0.9% FLUSH: 3.0000 mL | Freq: Two times a day (BID) | INTRAVENOUS | Status: DC

## 2016-09-19 MED ORDER — SODIUM CHLORIDE 0.9 % WEIGHT BASED INFUSION: 1.0000 mL/kg/h | INTRAVENOUS | Status: DC

## 2016-09-19 MED ORDER — ACETAMINOPHEN 325 MG PO TABS: 650.0000 mg | ORAL_TABLET | ORAL | Status: DC | PRN

## 2016-09-19 MED ORDER — ASPIRIN 81 MG PO CHEW: 81.0000 mg | CHEWABLE_TABLET | Freq: Every day | ORAL | Status: DC

## 2016-09-19 MED ORDER — VERAPAMIL HCL 2.5 MG/ML IV SOLN
INTRAVENOUS | Status: AC
  Filled 2016-09-19: qty 2

## 2016-09-19 MED ORDER — ASPIRIN 81 MG PO CHEW
CHEWABLE_TABLET | ORAL | Status: AC
Start: 2016-09-19 — End: 2016-09-19
  Administered 2016-09-19: 81 mg via ORAL
  Filled 2016-09-19: qty 1

## 2016-09-19 MED ORDER — LIDOCAINE HCL (PF) 1 % IJ SOLN
INTRAMUSCULAR | Status: AC
  Filled 2016-09-19: qty 30

## 2016-09-19 MED ORDER — SODIUM CHLORIDE 0.9% FLUSH: 3.0000 mL | INTRAVENOUS | Status: DC | PRN

## 2016-09-19 MED ORDER — FENTANYL CITRATE (PF) 100 MCG/2ML IJ SOLN
INTRAMUSCULAR | Status: AC
  Filled 2016-09-19: qty 2

## 2016-09-19 MED ORDER — MIDAZOLAM HCL 2 MG/2ML IJ SOLN
INTRAMUSCULAR | Status: DC | PRN
  Administered 2016-09-19: 1 mg via INTRAVENOUS

## 2016-09-19 MED ORDER — SODIUM CHLORIDE 0.9 % WEIGHT BASED INFUSION
3.0000 mL/kg/h | INTRAVENOUS | Status: DC
  Administered 2016-09-19: 3 mL/kg/h via INTRAVENOUS

## 2016-09-19 MED ORDER — OXYCODONE-ACETAMINOPHEN 5-325 MG PO TABS: 1.0000 | ORAL_TABLET | ORAL | Status: DC | PRN

## 2016-09-19 MED ORDER — HEPARIN (PORCINE) IN NACL 2-0.9 UNIT/ML-% IJ SOLN
INTRAMUSCULAR | Status: DC | PRN
  Administered 2016-09-19: 1000 mL

## 2016-09-19 MED ORDER — LIDOCAINE HCL (PF) 1 % IJ SOLN
INTRAMUSCULAR | Status: DC | PRN
  Administered 2016-09-19: 2 mL

## 2016-09-19 MED ORDER — FENTANYL CITRATE (PF) 100 MCG/2ML IJ SOLN
INTRAMUSCULAR | Status: DC | PRN
  Administered 2016-09-19: 50 ug via INTRAVENOUS

## 2016-09-19 MED ORDER — MIDAZOLAM HCL 2 MG/2ML IJ SOLN
INTRAMUSCULAR | Status: AC
  Filled 2016-09-19: qty 2

## 2016-09-19 MED ORDER — VERAPAMIL HCL 2.5 MG/ML IV SOLN
INTRAVENOUS | Status: DC | PRN
  Administered 2016-09-19: 10 mL via INTRA_ARTERIAL

## 2016-09-19 MED ORDER — IOPAMIDOL (ISOVUE-370) INJECTION 76%
INTRAVENOUS | Status: AC
  Filled 2016-09-19: qty 100

## 2016-09-19 MED ORDER — HEPARIN SODIUM (PORCINE) 1000 UNIT/ML IJ SOLN
INTRAMUSCULAR | Status: AC
  Filled 2016-09-19: qty 1

## 2016-09-19 MED ORDER — HEPARIN SODIUM (PORCINE) 1000 UNIT/ML IJ SOLN
INTRAMUSCULAR | Status: DC | PRN
  Administered 2016-09-19: 5000 [IU] via INTRAVENOUS

## 2016-09-19 SURGICAL SUPPLY — 11 items
CATH 5FR JL3.5 JR4 ANG PIG MP (CATHETERS) ×2 IMPLANT
COVER PRB 48X5XTLSCP FOLD TPE (BAG) ×1 IMPLANT
COVER PROBE 5X48 (BAG) ×1
DEVICE RAD COMP TR BAND LRG (VASCULAR PRODUCTS) ×2 IMPLANT
GLIDESHEATH SLEND A-KIT 6F 22G (SHEATH) ×2 IMPLANT
GUIDEWIRE INQWIRE 1.5J.035X260 (WIRE) ×1 IMPLANT
INQWIRE 1.5J .035X260CM (WIRE) ×2
KIT HEART LEFT (KITS) ×2 IMPLANT
PACK CARDIAC CATHETERIZATION (CUSTOM PROCEDURE TRAY) ×2 IMPLANT
TRANSDUCER W/STOPCOCK (MISCELLANEOUS) ×2 IMPLANT
TUBING CIL FLEX 10 FLL-RA (TUBING) ×2 IMPLANT

## 2016-09-19 NOTE — Discharge Instructions (Signed)
Radial Site Care °Refer to this sheet in the next few weeks. These instructions provide you with information about caring for yourself after your procedure. Your health care provider may also give you more specific instructions. Your treatment has been planned according to current medical practices, but problems sometimes occur. Call your health care provider if you have any problems or questions after your procedure. °What can I expect after the procedure? °After your procedure, it is typical to have the following: °· Bruising at the radial site that usually fades within 1-2 weeks. °· Blood collecting in the tissue (hematoma) that may be painful to the touch. It should usually decrease in size and tenderness within 1-2 weeks. °Follow these instructions at home: °· Take medicines only as directed by your health care provider. °· You may shower 24-48 hours after the procedure or as directed by your health care provider. Remove the bandage (dressing) and gently wash the site with plain soap and water. Pat the area dry with a clean towel. Do not rub the site, because this may cause bleeding. °· Do not take baths, swim, or use a hot tub until your health care provider approves. °· Check your insertion site every day for redness, swelling, or drainage. °· Do not apply powder or lotion to the site. °· Do not flex or bend the affected arm for 24 hours or as directed by your health care provider. °· Do not push or pull heavy objects with the affected arm for 24 hours or as directed by your health care provider. °· Do not lift over 10 lb (4.5 kg) for 5 days after your procedure or as directed by your health care provider. °· Ask your health care provider when it is okay to: °¨ Return to work or school. °¨ Resume usual physical activities or sports. °¨ Resume sexual activity. °· Do not drive home if you are discharged the same day as the procedure. Have someone else drive you. °· You may drive 24 hours after the procedure  unless otherwise instructed by your health care provider. °· Do not operate machinery or power tools for 24 hours after the procedure. °· If your procedure was done as an outpatient procedure, which means that you went home the same day as your procedure, a responsible adult should be with you for the first 24 hours after you arrive home. °· Keep all follow-up visits as directed by your health care provider. This is important. °Contact a health care provider if: °· You have a fever. °· You have chills. °· You have increased bleeding from the radial site. Hold pressure on the site. °Get help right away if: °· You have unusual pain at the radial site. °· You have redness, warmth, or swelling at the radial site. °· You have drainage (other than a small amount of blood on the dressing) from the radial site. °· The radial site is bleeding, and the bleeding does not stop after 30 minutes of holding steady pressure on the site. °· Your arm or hand becomes pale, cool, tingly, or numb. °This information is not intended to replace advice given to you by your health care provider. Make sure you discuss any questions you have with your health care provider. °Document Released: 06/04/2010 Document Revised: 10/08/2015 Document Reviewed: 11/18/2013 °Elsevier Interactive Patient Education © 2017 Else[employer] Inc[employer] ° °

## 2016-09-19 NOTE — Progress Notes (Signed)
Nell Range PA called concerning patients request to know when to start back on coumadin.  Patient instructed to start back this evening as long as there are no complications like bleeding, etc. Then to follow up with coumadin clinic.

## 2016-09-19 NOTE — Interval H&P Note (Signed)
Cath Lab Visit (complete for each Cath Lab visit)  Clinical Evaluation Leading to the Procedure:   ACS: No.  Non-ACS:    Anginal Classification: CCS [name]  Anti-ischemic medical therapy: Maximal Therapy (2 or more classes of medications)  Non-Invasive Test Results: Low-risk stress test findings: cardiac mortality <1%/year  Prior CABG: No previous CABG      History and Physical Interval Note:  09/19/2016 7:26 AM  [name]  has presented today for surgery, with the diagnosis of cp - cad  The various methods of treatment have been discussed with the patient and family. After consideration of risks, benefits and other options for treatment, the patient has consented to  Procedure(s): Left Heart Cath and Coronary Angiography (N/A) as a surgical intervention .  The patient's history has been reviewed, patient examined, no change in status, stable for surgery.  I have reviewed the patient's chart and labs.  Questions were answered to the patient's satisfaction.     [name]

## 2016-09-19 NOTE — H&P (View-Only) (Signed)
09/14/2016 [name]   1956/02/06  [id number]  Primary Physician [name], MD Primary Cardiologist: Dr Percival Spanish  HPI:  61 year old male with a prior history of atrial fibrillation and flutter status post atrial flutter ablation 2014, hypertension, tobacco abuse, COPD, obesity, hyperlipidemia, and sleep apnea (unable to tolerate C-pap). He was seen in clinic on April 13 after an episode of substernal chest discomfort that radiated to his back while he was helping his wife out of the car. This was associated with increasing fatigue and dyspnea. He went to see his PCP the next day and his ECG showed sinus rhythm with a new lateral T-wave inversions. He was seen ;later that week in our office. There was concern for acute coronary syndrome and he was admitted 08/26/16 for further evaluation.   Patient ruled out for myocardial infarction and had no further chest pain following admission. He underwent Lexiscan Myoview stress testing this morning which showed no evidence of infarct or ischemia. LV function was normal. Echo currently gram was also performed showing normal LV function with moderate LVH. There were no regional wall motion abnormalities. PA systolic pressure was moderately elevated at 42 mmHg.   He is in the office today for follow up. He continues to have exertional symptoms of chest discomfort and fatigue. He says this is definitely a change from his activity level prior to this las episode of chest pain. He says his symptoms are worse when he is using his arms. He says he now has to stop and rest when walking from his shop to the house.    Current Outpatient Prescriptions  Medication Sig Dispense Refill  . albuterol (PROVENTIL HFA;VENTOLIN HFA) 108 (90 BASE) MCG/ACT inhaler Inhale 2 puffs into the lungs every 6 (six) hours as needed for wheezing or shortness of breath.    . bisoprolol (ZEBETA) 10 MG tablet Take 10 mg by mouth daily.    . Cholecalciferol (VITAMIN D) 2000 UNITS  CAPS Take 1 capsule by mouth daily.    [name] diltiazem (CARDIZEM CD) 120 MG 24 hr capsule Take 120 mg by mouth daily.    [name] diltiazem (CARDIZEM CD) 120 MG 24 hr capsule TAKE 1 CAPSULE BY MOUTH DAILY 30 capsule 0  . furosemide (LASIX) 40 MG tablet Take 1 tablet (40 mg total) by mouth daily as needed for fluid. 30 tablet 2  . glimepiride (AMARYL) 1 MG tablet Take 1 mg by mouth daily before breakfast.     . HYDROcodone-acetaminophen (NORCO/VICODIN) 5-325 MG per tablet Take 1 tablet by mouth every 6 (six) hours as needed for pain.    . isosorbide mononitrate (IMDUR) 30 MG 24 hr tablet Take 1 tablet (30 mg total) by mouth daily. 30 tablet 6  . Liraglutide (VICTOZA) 18 MG/3ML SOPN Inject 12 mLs into the skin daily.     [name] lisinopril (PRINIVIL,ZESTRIL) 40 MG tablet TAKE 1 TABLET BY MOUTH ONCE DAILY 90 tablet 3  . metFORMIN (GLUCOPHAGE) 1000 MG tablet Take 1,000 mg by mouth 2 (two) times daily with a meal.    . Multiple Vitamin (MULTIVITAMIN WITH MINERALS) TABS Take 1 tablet by mouth daily.    . nitroGLYCERIN (NITROSTAT) 0.4 MG SL tablet Place 1 tablet (0.4 mg total) under the tongue every 5 (five) minutes x 3 doses as needed for chest pain. 25 tablet 3  . omeprazole (PRILOSEC) 20 MG capsule Take 20 mg by mouth 2 (two) times daily.     . potassium chloride (KLOR-CON 10) 10 MEQ tablet Take  10 mEq by mouth daily.     . pravastatin (PRAVACHOL) 80 MG tablet TAKE 1 TABLET BY MOUTH EVERY NIGHT AT BEDTIME 30 tablet 11  . sildenafil (VIAGRA) 25 MG tablet Take 25 mg by mouth daily as needed for erectile dysfunction.    [name] warfarin (COUMADIN) 5 MG tablet Take 1 to 1 and 1/2 tablets every day as directed by coumadin clinic (Patient taking differently: 7.5mg  Sun Mon Wed Fri 5mg  Sat Tues Thur) 40 tablet 2   No current facility-administered medications for this visit.     Allergies  Allergen Reactions  . Adhesive [Tape]     itching  . Latex Itching    When tape is on the skin too long skin gets red & itching     Past Medical History:  Diagnosis Date  . Arthritis    lower back  . Asthma   . Atrial flutter ([place])    s/p CTI ablation by Dr Rayann Heman  . Brain aneurysm 2009   questionable. A follow up CTA in 2009 showed no evidence of  . Chest pain    a. 08/2016 Neg MV, EF 54%.  . Chronic back pain    DDD/stenosis  . Colon polyps    9 polyps removed 10/13/11  . Complication of anesthesia September 11, 2012   slow to awaken after ablation  . Diabetes mellitus    takes Metformin and Glimepiride daily  . Emphysema   . GERD (gastroesophageal reflux disease)    takes Omeprazole bid  . History of shingles   . HLD (hyperlipidemia)    takes Pravastatin daily  . HTN (hypertension)    takes Prinizide daily  . Obesity   . OSA (obstructive sleep apnea)    not always using cpap  . Overdose 2009   unintentional Flecanide overdose  . Peripheral neuropathy   . Short-term memory loss   . Tobacco abuse   . Unstable angina ([place]) 08/26/2016    Social History   Social History  . Marital status: Married    Spouse name: N/A  . Number of children: 2  . Years of education: N/A   Occupational History  . Mechanic     Social History Main Topics  . Smoking status: Current Every Day Smoker    Packs/day: 0.30    Years: 40.00    Types: Cigarettes  . Smokeless tobacco: Never Used  . Alcohol use 0.0 oz/week     Comment: couple of times a month liquor, he tells me he typically drinks 2-4 drinks most days but hasnt drank anything in 6 weeks  . Drug use: No  . Sexual activity: Yes   Other Topics Concern  . Not on file   Social History Narrative   Daily caffeine(Mountain Dew)       Lives in [place] with spouse.   Unemployed due to chronic back/ leg pain           Family History  Problem Relation Age of Onset  . Colon cancer Neg Hx   . Anesthesia problems Neg Hx   . Hypotension Neg Hx   . Malignant hyperthermia Neg Hx   . Pseudochol deficiency Neg Hx      Review of Systems: General:  negative for chills, fever, night sweats or weight changes.  Cardiovascular: negative for chest pain, dyspnea on exertion, edema, orthopnea, palpitations, paroxysmal nocturnal dyspnea or shortness of breath Dermatological: negative for rash Respiratory: negative for cough or wheezing Urologic: negative for hematuria Abdominal: negative for  nausea, vomiting, diarrhea, bright red blood per rectum, melena, or hematemesis Neurologic: negative for visual changes, syncope, or dizziness All other systems reviewed and are otherwise negative except as noted above.    Blood pressure (!) 160/80, pulse 76, height 6\' 3"  (1.905 m), weight 254 lb (115.2 kg).  General appearance: alert, cooperative, no distress and moderately obese Neck: no carotid bruit and no JVD Lungs: few rhonchi Rt base Heart: regular rate and rhythm Abdomen: obese, ventral hernia Extremities: no edema Pulses: 2+ and symmetric good radial pulses bilaterally as well as LE pulses Skin: Skin color, texture, turgor normal. No rashes or lesions Neurologic: Grossly normal  EKG NSR, NSST changes, LVH  Echo 08/28/16- Study Conclusions  - Left ventricle: The cavity size was normal. Wall thickness was   increased in a pattern of moderate LVH. Systolic function was   normal. The estimated ejection fraction was in the range of 55%   to 60%. Wall motion was normal; there were no regional wall   motion abnormalities. - Mitral valve: Mildly calcified annulus. - Left atrium: The atrium was mildly dilated. - Right atrium: The atrium was moderately dilated. - Pulmonary arteries: Systolic pressure was moderately increased.   PA peak pressure: 42 mm Hg (S).  Myoview 08/28/16- IMPRESSION: 1. No reversible ischemia or infarction.  2. Left ventricular dilatation with end-diastolic volume of 122 cc mL and end systolic volume of 92 mL. Normal left ventricular wall motion.  3. Left ventricular ejection fraction 54%  4. Non invasive  risk stratification*: Low   ASSESSMENT AND PLAN:   Chest pain with moderate risk of acute coronary syndrome Pt continues to have exertional chest discomfort and DOE- this is new since his initial episode 2 weeks ago  Longstanding persistent atrial fibrillation ([place]) He was in NSR 08/28/16 and is in NSR on exam  Long term current use of anticoagulant therapy Coumadin will be held 3 days pre cath  Non-insulin dependent type 2 diabetes mellitus ([place]) Will hold Glucophage pre cath   Tobacco abuse Trying to quit  COPD (chronic obstructive pulmonary disease) ([place]) Gold B Spirometry 2015 with FEV1 59%, ratio 61    PLAN  Discussed with Dr Martinique- despite recent negative studies he continues to have symptoms concerning for angina. Will arrange OP diagnostic cath.   The patient understands that risks included but are not limited to stroke (1 in 1000), death (1 in 15), kidney failure [usually temporary] (1 in 500), bleeding (1 in 200), allergic reaction [possibly serious] (1 in 200).  The patient understands and agrees to proceed.   Kerin Ransom PA-C 09/14/2016 8:26 AM

## 2016-09-26 ENCOUNTER — Telehealth: Payer: Self-pay | Admitting: Cardiology

## 2016-09-26 NOTE — Telephone Encounter (Signed)
New Message   Pt starts that his family dr has him starting another medicine. Requests a call back because it is another BP medicine hycrodiuril 25MG 

## 2016-09-26 NOTE — Telephone Encounter (Signed)
Spoke with pt he states that he went to see PCP Dr Rex Kras, he changed pt from lasix to HCTZ 25mg , his BP at that office visit (per pt) was ~173/90 and he states that it has been running ~ 170's/80-90's. He was wonder if you agree with this change. Please advise

## 2016-09-26 NOTE — Telephone Encounter (Signed)
I agree

## 2016-09-27 NOTE — Telephone Encounter (Signed)
Pt.notified

## 2016-09-29 ENCOUNTER — Ambulatory Visit (INDEPENDENT_AMBULATORY_CARE_PROVIDER_SITE_OTHER): Payer: BLUE CROSS/BLUE SHIELD | Admitting: Pharmacist

## 2016-09-29 ENCOUNTER — Ambulatory Visit (INDEPENDENT_AMBULATORY_CARE_PROVIDER_SITE_OTHER): Payer: BLUE CROSS/BLUE SHIELD | Admitting: Cardiology

## 2016-09-29 ENCOUNTER — Encounter: Payer: Self-pay | Admitting: Cardiology

## 2016-09-29 VITALS — BP 170/80 | HR 76 | Ht 75.0 in | Wt 255.0 lb

## 2016-09-29 DIAGNOSIS — I480 Paroxysmal atrial fibrillation: Secondary | ICD-10-CM

## 2016-09-29 DIAGNOSIS — G4733 Obstructive sleep apnea (adult) (pediatric): Secondary | ICD-10-CM | POA: Diagnosis not present

## 2016-09-29 DIAGNOSIS — I10 Essential (primary) hypertension: Secondary | ICD-10-CM

## 2016-09-29 DIAGNOSIS — Z720 Tobacco use: Secondary | ICD-10-CM | POA: Diagnosis not present

## 2016-09-29 DIAGNOSIS — Z5181 Encounter for therapeutic drug level monitoring: Secondary | ICD-10-CM | POA: Diagnosis not present

## 2016-09-29 DIAGNOSIS — E785 Hyperlipidemia, unspecified: Secondary | ICD-10-CM | POA: Diagnosis not present

## 2016-09-29 DIAGNOSIS — J438 Other emphysema: Secondary | ICD-10-CM | POA: Diagnosis not present

## 2016-09-29 DIAGNOSIS — R079 Chest pain, unspecified: Secondary | ICD-10-CM

## 2016-09-29 DIAGNOSIS — Z0389 Encounter for observation for other suspected diseases and conditions ruled out: Secondary | ICD-10-CM

## 2016-09-29 DIAGNOSIS — IMO0001 Reserved for inherently not codable concepts without codable children: Secondary | ICD-10-CM

## 2016-09-29 DIAGNOSIS — Z7901 Long term (current) use of anticoagulants: Secondary | ICD-10-CM | POA: Diagnosis not present

## 2016-09-29 LAB — POCT INR: INR: 2.1

## 2016-09-29 NOTE — Assessment & Plan Note (Signed)
Cath 09/19/16 for chest pain 

## 2016-09-29 NOTE — Assessment & Plan Note (Signed)
This is not secondary to angina- probably secondary to stress, COPD, obesity, OSA

## 2016-09-29 NOTE — Progress Notes (Signed)
09/29/2016 [name]   09/27/1955  [id number]  Primary Physician [name], MD Primary Cardiologist: Dr Percival Spanish  HPI:  61 year old male, works Engineer, manufacturing, with a prior history of atrial fibrillation and flutter status post atrial flutter ablation 2014, hypertension, tobacco abuse, COPD, obesity, hyperlipidemia, and sleep apnea (unable to tolerate C-pap). He was seen in clinic on 08/26/16 after an episode of substernal chest discomfort that radiated to his back while he was helping his wife out of the car. This was associated with increasing fatigue and dyspnea. He went to see his PCP the next day and his ECG showed sinus rhythm with a new lateral T-wave inversions. He was seen later that week in our office. There was concern for acute coronary syndrome and he was admitted 08/26/16 for further evaluation.The patient ruled out for myocardial infarction and had no further chest pain and was discharged to have an OP Myoview.[name] He underwent Myoview stress testing 08/27/16 which showed no evidence of infarct or ischemia. LV function was normal. Echo was also performed 08/28/16 which showed normal LV function with moderate LVH. There were no regional wall motion abnormalities. PA systolic pressure was moderately elevated at 42 mmHg.   He was seen in the office 09/14/16 with complaints of continued exertional chest pain which he felt He has multiple cardiac risk factors including diabetes and it was decided to proceed with diagnostic cath. This was done 09/19/16 and showed widely patent coronaries. He is in the office today for follow up. He says he is doing better. He now attributes some of his symptoms to stress. He is struggling to care for his mother at home and she may need to be placed in a nursing home. He also has significant COPD (Dr Elsworth Soho follows) and sleep apnea but is unable tolerate C-pap. HCTZ was recently added by Dr Rex Kras for HTN.     Current Outpatient Prescriptions  Medication  Sig Dispense Refill  . albuterol (PROVENTIL HFA;VENTOLIN HFA) 108 (90 BASE) MCG/ACT inhaler Inhale 2 puffs into the lungs every 6 (six) hours as needed for wheezing or shortness of breath.    . bisoprolol (ZEBETA) 10 MG tablet Take 10 mg by mouth daily.    . Carboxymethylcellul-Glycerin (LUBRICATING EYE DROPS OP) Apply 1 drop to eye daily as needed (irritation).    . Cholecalciferol (VITAMIN D) 2000 UNITS CAPS Take 2,000 Units by mouth daily.     [name] diltiazem (CARDIZEM CD) 120 MG 24 hr capsule TAKE 1 CAPSULE BY MOUTH DAILY 30 capsule 0  . diltiazem (CARDIZEM) 60 MG tablet Take 30 mg by mouth 2 (two) times daily as needed (afib).    [name] glimepiride (AMARYL) 1 MG tablet Take 1 mg by mouth daily before breakfast.     . hydrochlorothiazide (HYDRODIURIL) 25 MG tablet Take 25 mg by mouth daily.    [name] HYDROcodone-acetaminophen (NORCO/VICODIN) 5-325 MG per tablet Take 1 tablet by mouth every 6 (six) hours as needed for pain.    . isosorbide mononitrate (IMDUR) 30 MG 24 hr tablet Take 1 tablet (30 mg total) by mouth daily. 30 tablet 6  . Liraglutide (VICTOZA) 18 MG/3ML SOPN Inject 1.2 mg into the skin daily.     [name] lisinopril (PRINIVIL,ZESTRIL) 40 MG tablet TAKE 1 TABLET BY MOUTH ONCE DAILY 90 tablet 3  . metFORMIN (GLUCOPHAGE) 1000 MG tablet Take 1,000 mg by mouth 2 (two) times daily.     . Multiple Vitamin (MULTIVITAMIN WITH MINERALS) TABS Take 1 tablet by mouth daily.    [name]  nitroGLYCERIN (NITROSTAT) 0.4 MG SL tablet Place 1 tablet (0.4 mg total) under the tongue every 5 (five) minutes x 3 doses as needed for chest pain. 25 tablet 3  . omeprazole (PRILOSEC) 20 MG capsule Take 20 mg by mouth at bedtime.     . potassium chloride (KLOR-CON 10) 10 MEQ tablet Take 10 mEq by mouth daily.     . pravastatin (PRAVACHOL) 80 MG tablet TAKE 1 TABLET BY MOUTH EVERY NIGHT AT BEDTIME 30 tablet 11  . sildenafil (REVATIO) 20 MG tablet Take 20-80 mg by mouth daily as needed (erectile dysfunction).    . warfarin (COUMADIN) 5 MG  tablet Take 1 to 1 and 1/2 tablets every day as directed by coumadin clinic (Patient taking differently: 7.5mg  Sun Mon Wed Fri 5mg  Sat Tues Thur) 40 tablet 2   No current facility-administered medications for this visit.     Allergies  Allergen Reactions  . Adhesive [Tape]     itching  . Latex Itching    When tape is on the skin too long skin gets red & itching    Past Medical History:  Diagnosis Date  . Arthritis    lower back  . Asthma   . Atrial flutter ([place])    s/p CTI ablation by Dr Rayann Heman  . Brain aneurysm 2009   questionable. A follow up CTA in 2009 showed no evidence of  . Chest pain    a. 08/2016 Neg MV, EF 54%.  . Chronic back pain    DDD/stenosis  . Colon polyps    9 polyps removed 10/13/11  . Complication of anesthesia September 11, 2012   slow to awaken after ablation  . Diabetes mellitus    takes Metformin and Glimepiride daily  . Emphysema   . GERD (gastroesophageal reflux disease)    takes Omeprazole bid  . History of shingles   . HLD (hyperlipidemia)    takes Pravastatin daily  . HTN (hypertension)    takes Prinizide daily  . Obesity   . OSA (obstructive sleep apnea)    not always using cpap  . Overdose 2009   unintentional Flecanide overdose  . Peripheral neuropathy   . Short-term memory loss   . Tobacco abuse   . Unstable angina ([place]) 08/26/2016    Social History   Social History  . Marital status: Married    Spouse name: N/A  . Number of children: 2  . Years of education: N/A   Occupational History  . Mechanic     Social History Main Topics  . Smoking status: Current Every Day Smoker    Packs/day: 0.30    Years: 40.00    Types: Cigarettes  . Smokeless tobacco: Never Used  . Alcohol use 0.0 oz/week     Comment: couple of times a month liquor, he tells me he typically drinks 2-4 drinks most days but hasnt drank anything in 6 weeks  . Drug use: No  . Sexual activity: Yes   Other Topics Concern  . Not on file   Social History  Narrative   Daily caffeine(Mountain Dew)       Lives in [place] with spouse.   Unemployed due to chronic back/ leg pain           Family History  Problem Relation Age of Onset  . Colon cancer Neg Hx   . Anesthesia problems Neg Hx   . Hypotension Neg Hx   . Malignant hyperthermia Neg Hx   . Pseudochol deficiency  Neg Hx      Review of Systems: General: negative for chills, fever, night sweats or weight changes.  Cardiovascular: negative for chest pain, dyspnea on exertion, orthopnea, palpitations, paroxysmal nocturnal dyspnea  Dermatological: negative for rash Respiratory: negative for cough or wheezing Urologic: negative for hematuria Abdominal: negative for nausea, vomiting, diarrhea, bright red blood per rectum, melena, or hematemesis Neurologic: negative for visual changes, syncope, or dizziness All other systems reviewed and are otherwise negative except as noted above.    Blood pressure (!) 170/80, pulse 76, height 6\' 3"  (1.905 m), weight 255 lb (115.7 kg).  General appearance: alert, cooperative, no distress and moderately obese Lungs: decreased with scattered rhonchi Heart: regular rate and rhythm Extremities: trace edema Neurologic: Grossly normal   ASSESSMENT AND PLAN:   Chest pain Non cardiac based on recent cath, probably secondary to stress, COPD, obesity, OSA  Longstanding persistent atrial fibrillation ([place]) He was in NSR 08/28/16 and is in NSR on exam today  Long term current use of anticoagulant therapy Coumadin Rx  Non-insulin dependent type 2 diabetes mellitus ([facility])  Glucophage   Tobacco abuse Trying to quit  COPD (chronic obstructive pulmonary disease) ([place]) Gold B Spirometry 2015 with FEV1 59%, ratio 61   Sleep apnea Unable to tolerate C-pap  PLAN  F/U B/P by me 148/72. He should f/u with Dr Rex Kras ina week or two for a BMP and B/P check (pt lives two miles from Dr [employer] office, 20 miles from here). If he continues to  have exertional symptoms I would get a Holter Monitor to r/o PAF with RVR. F/U 6 months unless he has more symptoms.   Kerin Ransom PA-C 09/29/2016 8:57 AM

## 2016-09-29 NOTE — Patient Instructions (Signed)
Medication Instructions:  NO CHANGES  If you need a refill on your cardiac medications before your next appointment, please call your pharmacy.  Labwork: BMP WITH PCP  Follow-Up: Your physician wants you to follow-up in: [place] will receive a reminder letter in the mail two months in advance. If you don't receive a letter, please call our office SEPTEMBER 2018 to schedule the November 2018 follow-up appointment.   Special Instructions: SCHEDULE BLOOD PRESSURE CHECK WITH PCP IN 1-2 WEEKS    Thank you for choosing [facility] at [facility]!!

## 2016-10-04 ENCOUNTER — Other Ambulatory Visit: Payer: Self-pay | Admitting: Cardiology

## 2016-10-04 NOTE — Telephone Encounter (Signed)
Rx request sent to pharmacy.  

## 2016-10-18 ENCOUNTER — Telehealth: Payer: Self-pay | Admitting: Pulmonary Disease

## 2016-10-18 MED ORDER — ALBUTEROL SULFATE HFA 108 (90 BASE) MCG/ACT IN AERS: 1.0000 | INHALATION_SPRAY | Freq: Four times a day (QID) | RESPIRATORY_TRACT | 4 refills | Status: DC | PRN

## 2016-10-18 NOTE — Telephone Encounter (Signed)
Spoke with patient. He states he has the Proventil inhaler and wanted to know if he had any samples. Advised they do not carry those samples in the office.  He stated he was having problems with the Proventil. When he uses it, it feels it is not spraying any medication. Patient states he has already primed the inhaler but is wondering if he needs to switch to a new inhaler.    Patient uses [facility].   RA, please advise.

## 2016-10-18 NOTE — Telephone Encounter (Signed)
Change to pro-air which has a dose counter

## 2016-10-18 NOTE — Telephone Encounter (Signed)
Spoke with patient with RA's recs. Patient verbalized understanding. Pro Air has been sent in to pts pharmacy and he is aware.

## 2016-10-27 ENCOUNTER — Ambulatory Visit (INDEPENDENT_AMBULATORY_CARE_PROVIDER_SITE_OTHER): Payer: BLUE CROSS/BLUE SHIELD | Admitting: *Deleted

## 2016-10-27 DIAGNOSIS — Z5181 Encounter for therapeutic drug level monitoring: Secondary | ICD-10-CM | POA: Diagnosis not present

## 2016-10-27 DIAGNOSIS — I480 Paroxysmal atrial fibrillation: Secondary | ICD-10-CM | POA: Diagnosis not present

## 2016-10-27 LAB — POCT INR: INR: 2.6

## 2016-11-11 ENCOUNTER — Other Ambulatory Visit: Payer: Self-pay | Admitting: Cardiology

## 2016-11-24 ENCOUNTER — Ambulatory Visit (INDEPENDENT_AMBULATORY_CARE_PROVIDER_SITE_OTHER): Payer: BLUE CROSS/BLUE SHIELD | Admitting: *Deleted

## 2016-11-24 DIAGNOSIS — I480 Paroxysmal atrial fibrillation: Secondary | ICD-10-CM

## 2016-11-24 DIAGNOSIS — Z5181 Encounter for therapeutic drug level monitoring: Secondary | ICD-10-CM

## 2016-11-24 LAB — POCT INR: INR: 3.3

## 2016-12-02 ENCOUNTER — Ambulatory Visit (INDEPENDENT_AMBULATORY_CARE_PROVIDER_SITE_OTHER): Payer: BLUE CROSS/BLUE SHIELD | Admitting: *Deleted

## 2016-12-02 DIAGNOSIS — I480 Paroxysmal atrial fibrillation: Secondary | ICD-10-CM

## 2016-12-02 DIAGNOSIS — Z5181 Encounter for therapeutic drug level monitoring: Secondary | ICD-10-CM | POA: Diagnosis not present

## 2016-12-02 LAB — POCT INR: INR: 3.2

## 2016-12-14 ENCOUNTER — Ambulatory Visit (INDEPENDENT_AMBULATORY_CARE_PROVIDER_SITE_OTHER): Payer: BLUE CROSS/BLUE SHIELD | Admitting: *Deleted

## 2016-12-14 DIAGNOSIS — Z5181 Encounter for therapeutic drug level monitoring: Secondary | ICD-10-CM | POA: Diagnosis not present

## 2016-12-14 DIAGNOSIS — I480 Paroxysmal atrial fibrillation: Secondary | ICD-10-CM

## 2016-12-14 LAB — POCT INR: INR: 3.2

## 2016-12-26 ENCOUNTER — Telehealth: Payer: Self-pay | Admitting: Cardiology

## 2016-12-26 NOTE — Telephone Encounter (Signed)
New Message     Pt c/o of Chest Pain: STAT if CP now or developed within 24 hours  1. Are you having CP right now? Chest pressure   2. Are you experiencing any other symptoms (ex. SOB, nausea, vomiting, sweating)? No, when he bends over feels like he is going to be pass out   3. How long have you been experiencing CP?  6 weeks , just occurring more often   4. Is your CP continuous or coming and going? Comes and goes   5. Have you taken Nitroglycerin? no ?

## 2016-12-26 NOTE — Telephone Encounter (Signed)
Spoke with pt, he has been having a "weird feeling" in his chest. it has been going on since June and he reports it is getting worse. He is under a lot of stress and this seems to come on with stress and work. Also at rest, he reports it gets better after taking a nap. He states this is exactly how he felt prior to his cath. Reassurance given to the patient that he had no blockages in his coronary arteries. He has a follow up with his medical doctor tomorrow and discuss maybe getting a nerve pill. He has an appointment in 2 weeks and will keep that appointment if needed. He will have dr little call tomorrow if concerned this is heart pain. Pt agreed with this plan.

## 2016-12-28 ENCOUNTER — Ambulatory Visit (INDEPENDENT_AMBULATORY_CARE_PROVIDER_SITE_OTHER): Payer: BLUE CROSS/BLUE SHIELD | Admitting: *Deleted

## 2016-12-28 DIAGNOSIS — Z5181 Encounter for therapeutic drug level monitoring: Secondary | ICD-10-CM

## 2016-12-28 DIAGNOSIS — I480 Paroxysmal atrial fibrillation: Secondary | ICD-10-CM | POA: Diagnosis not present

## 2016-12-28 LAB — POCT INR: INR: 2.4

## 2017-01-05 ENCOUNTER — Ambulatory Visit: Payer: BLUE CROSS/BLUE SHIELD | Admitting: Cardiology

## 2017-01-18 ENCOUNTER — Ambulatory Visit (INDEPENDENT_AMBULATORY_CARE_PROVIDER_SITE_OTHER): Payer: BLUE CROSS/BLUE SHIELD | Admitting: *Deleted

## 2017-01-18 DIAGNOSIS — Z5181 Encounter for therapeutic drug level monitoring: Secondary | ICD-10-CM | POA: Diagnosis not present

## 2017-01-18 DIAGNOSIS — I480 Paroxysmal atrial fibrillation: Secondary | ICD-10-CM

## 2017-01-18 LAB — POCT INR: INR: 3.1

## 2017-01-23 ENCOUNTER — Other Ambulatory Visit: Payer: Self-pay | Admitting: Cardiology

## 2017-01-27 ENCOUNTER — Ambulatory Visit (INDEPENDENT_AMBULATORY_CARE_PROVIDER_SITE_OTHER): Payer: BLUE CROSS/BLUE SHIELD

## 2017-01-27 DIAGNOSIS — I480 Paroxysmal atrial fibrillation: Secondary | ICD-10-CM | POA: Diagnosis not present

## 2017-01-27 DIAGNOSIS — Z5181 Encounter for therapeutic drug level monitoring: Secondary | ICD-10-CM

## 2017-01-27 LAB — POCT INR: INR: 3.3

## 2017-02-02 ENCOUNTER — Ambulatory Visit (INDEPENDENT_AMBULATORY_CARE_PROVIDER_SITE_OTHER): Payer: BLUE CROSS/BLUE SHIELD | Admitting: Pulmonary Disease

## 2017-02-02 ENCOUNTER — Encounter: Payer: Self-pay | Admitting: Pulmonary Disease

## 2017-02-02 DIAGNOSIS — Z23 Encounter for immunization: Secondary | ICD-10-CM | POA: Diagnosis not present

## 2017-02-02 DIAGNOSIS — G4733 Obstructive sleep apnea (adult) (pediatric): Secondary | ICD-10-CM

## 2017-02-02 DIAGNOSIS — J449 Chronic obstructive pulmonary disease, unspecified: Secondary | ICD-10-CM | POA: Diagnosis not present

## 2017-02-02 MED ORDER — UMECLIDINIUM-VILANTEROL 62.5-25 MCG/INH IN AEPB: 1.0000 | INHALATION_SPRAY | Freq: Every day | RESPIRATORY_TRACT | 0 refills | Status: DC

## 2017-02-02 NOTE — Patient Instructions (Signed)
Flu shot today. Sample of [facility] - uses once daily, call us back for prescription if this works   You have to quit smoking!

## 2017-02-02 NOTE — Progress Notes (Signed)
   Subjective:    Patient ID: MELROY BOUGHER, male    DOB: [birth year], 61 y.o.   MRN: [id number]  HPI  PCP - [name]   61/M, smoker for FU of COPD & OSA He has chronic atrial fibrillation,s/p  TEE/DCCV in the past , ablation 04/2015.  He reports sinus drainage for many years   Annual follow-up. He continues to smoke about 2 packs every week, his mother is sick in a nursing home and he feels this is making him more anxious and making him smoke more.  His dyspnea has improved. He was recently treated with antibiotics and completed about 2 weeks ago for "pneumonia", by PCP but no chest x-ray was performed. He uses rescue albuterol about once a week He is more limited by his pain in his back and sciatica rather than dyspnea  He was diagnosed with OSA and has a CPAP machine but does not use. He is severe insomnia and sleeps okay every 12 hours at night and sleeps in the daytime. He was unable to tolerate full face mask in the past. He is resistant to trying it    Significant tests/ events  Spirometry 01/2010 showed ratio of 68, FEV 1 was 72%, smaller airways 55%, no significant BD response   PSG from [facility] - severe with AHI 31.h corrected by CPAP 5 cm (wt was 250 lbs)  11/2011 Underwent neck sx >> given pre-op prednisone for asthmatic bronchitis -uneventful post op    Spirometry fev1 68%  09/2012 FEV1 67%, no BD response  Spirometry 2015 with FEV1 59%, ratio 61    Review of Systems neg for any significant sore throat, dysphagia, itching, sneezing, nasal congestion or excess/ purulent secretions, fever, chills, sweats, unintended wt loss, pleuritic or exertional cp, hempoptysis, orthopnea pnd or change in chronic leg swelling. Also denies presyncope, palpitations, heartburn, abdominal pain, nausea, vomiting, diarrhea or change in bowel or urinary habits, dysuria,hematuria, rash, arthralgias, visual complaints, headache, numbness weakness or ataxia.     Objective:   Physical Exam   Gen. Pleasant, well-nourished, in no distress ENT - no thrush, no post nasal drip Neck: No JVD, no thyromegaly, no carotid bruits Lungs: no use of accessory muscles, no dullness to percussion, clear without rales or rhonchi  Cardiovascular: Rhythm regular, heart sounds  normal, no murmurs or gallops, no peripheral edema Musculoskeletal: No deformities, no cyanosis or clubbing         Assessment & Plan:

## 2017-02-02 NOTE — Assessment & Plan Note (Signed)
He is not using CPAP. Reports severe insomnia and he sleeps mostly during the day. Not interested in starting again

## 2017-02-02 NOTE — Addendum Note (Signed)
Addended by: Georjean Mode on: 02/02/2017 09:37 AM   Modules accepted: Orders

## 2017-02-02 NOTE — Assessment & Plan Note (Signed)
Flu shot today. Sample of [facility] - uses once daily, call us back for prescription if this works   Smoking cessation again emphasized is the most important intervention here. He is not interested in pulmonary rehab

## 2017-02-15 ENCOUNTER — Ambulatory Visit (INDEPENDENT_AMBULATORY_CARE_PROVIDER_SITE_OTHER): Payer: BLUE CROSS/BLUE SHIELD | Admitting: *Deleted

## 2017-02-15 DIAGNOSIS — Z5181 Encounter for therapeutic drug level monitoring: Secondary | ICD-10-CM | POA: Diagnosis not present

## 2017-02-15 DIAGNOSIS — I480 Paroxysmal atrial fibrillation: Secondary | ICD-10-CM

## 2017-02-15 LAB — POCT INR: INR: 2.2

## 2017-03-14 ENCOUNTER — Ambulatory Visit (INDEPENDENT_AMBULATORY_CARE_PROVIDER_SITE_OTHER): Payer: BLUE CROSS/BLUE SHIELD | Admitting: *Deleted

## 2017-03-14 DIAGNOSIS — I480 Paroxysmal atrial fibrillation: Secondary | ICD-10-CM | POA: Diagnosis not present

## 2017-03-14 DIAGNOSIS — Z5181 Encounter for therapeutic drug level monitoring: Secondary | ICD-10-CM

## 2017-03-14 LAB — POCT INR: INR: 2

## 2017-03-27 ENCOUNTER — Other Ambulatory Visit: Payer: Self-pay | Admitting: Nurse Practitioner

## 2017-03-27 NOTE — Telephone Encounter (Signed)
Rx has been sent to the pharmacy electronically. ° °

## 2017-04-03 ENCOUNTER — Telehealth: Payer: Self-pay | Admitting: *Deleted

## 2017-04-03 NOTE — Telephone Encounter (Signed)
Pt called to inform me that he started Prednisone 6 day taper on yesterday and Cefdinir ABX. Informed him that ABX is ok but steroid can increase INR. Thus, after discussing this with Milas Hock D have pt take 5mg  QD starting tomorrow. Appt on next Tuesday.

## 2017-04-04 ENCOUNTER — Telehealth: Payer: Self-pay | Admitting: Pulmonary Disease

## 2017-04-04 MED ORDER — TIOTROPIUM BROMIDE-OLODATEROL 2.5-2.5 MCG/ACT IN AERS: 2.0000 | INHALATION_SPRAY | Freq: Every day | RESPIRATORY_TRACT | 0 refills | Status: DC

## 2017-04-04 NOTE — Telephone Encounter (Signed)
Spoke with patient. Advised him that we do not have samples of Anoro at this time. Patient was upset because his insurance will not cover Anoro. Asked patient to contact his insurance to what medications are covered. Also offered to sign patient up for patient assistance. He declined these.   Spoke with RA to see if any other inhalers that we currently have samples of would help patient. Stiolto was selected.   Patient is aware of Stiolto. Sample with directions were placed up front for him to pick up today while his wife is here.   Nothing else needed at time of call.

## 2017-04-05 ENCOUNTER — Telehealth: Payer: Self-pay | Admitting: Pulmonary Disease

## 2017-04-05 NOTE — Telephone Encounter (Signed)
Please arrange follow-up visit with NP on Friday to assess

## 2017-04-05 NOTE — Telephone Encounter (Signed)
  to Luretha Rued     04/05/17 9:19 AM  Pt seen at Urgent Care this past Saturday and was told he had pneumonia. He says the antibotics they gave do not seem to be helping him.       Spoke with pt, who states he was seen at UC last weekend and was dx with PNA. Pt states he was prescribed Omnicef & prednisone. Pt states he isn't having any improvement in symptoms. Pt states last dose of prednisone & abx will be Friday.  Pt reports of prod cough with mucus, nasal drainage green in color & increased sob. Pt denies fever, chills or sweat.   RA please advise. Thanks.

## 2017-04-05 NOTE — Telephone Encounter (Signed)
Called pt and advised message from the provider. Pt understood and verbalized understanding. Nothing further is needed.   I advised him to go to the ER if he cannot wait. No appointments on Friday.

## 2017-04-07 ENCOUNTER — Telehealth: Payer: Self-pay | Admitting: Pulmonary Disease

## 2017-04-07 NOTE — Telephone Encounter (Signed)
error 

## 2017-04-10 ENCOUNTER — Ambulatory Visit (INDEPENDENT_AMBULATORY_CARE_PROVIDER_SITE_OTHER): Payer: BLUE CROSS/BLUE SHIELD | Admitting: Adult Health

## 2017-04-10 ENCOUNTER — Encounter: Payer: Self-pay | Admitting: Adult Health

## 2017-04-10 ENCOUNTER — Ambulatory Visit (INDEPENDENT_AMBULATORY_CARE_PROVIDER_SITE_OTHER)
Admission: RE | Admit: 2017-04-10 | Discharge: 2017-04-10 | Disposition: A | Discharge status: Home / Self Care | Payer: BLUE CROSS/BLUE SHIELD | Source: Ambulatory Visit | Attending: Adult Health | Admitting: Adult Health

## 2017-04-10 VITALS — BP 134/66 | HR 78 | Ht 75.0 in | Wt 241.0 lb

## 2017-04-10 DIAGNOSIS — I10 Essential (primary) hypertension: Secondary | ICD-10-CM

## 2017-04-10 DIAGNOSIS — J441 Chronic obstructive pulmonary disease with (acute) exacerbation: Secondary | ICD-10-CM

## 2017-04-10 DIAGNOSIS — G4733 Obstructive sleep apnea (adult) (pediatric): Secondary | ICD-10-CM

## 2017-04-10 MED ORDER — UMECLIDINIUM-VILANTEROL 62.5-25 MCG/INH IN AEPB: 1.0000 | INHALATION_SPRAY | Freq: Every day | RESPIRATORY_TRACT | 0 refills | Status: DC

## 2017-04-10 MED ORDER — LEVOFLOXACIN 500 MG PO TABS: 500.0000 mg | ORAL_TABLET | Freq: Every day | ORAL | 0 refills | Status: AC

## 2017-04-10 MED ORDER — PREDNISONE 10 MG PO TABS: ORAL_TABLET | ORAL | 0 refills | Status: DC

## 2017-04-10 MED ORDER — UMECLIDINIUM-VILANTEROL 62.5-25 MCG/INH IN AEPB: 1.0000 | INHALATION_SPRAY | Freq: Every day | RESPIRATORY_TRACT | 4 refills | Status: DC

## 2017-04-10 MED ORDER — ALBUTEROL SULFATE HFA 108 (90 BASE) MCG/ACT IN AERS: 1.0000 | INHALATION_SPRAY | Freq: Four times a day (QID) | RESPIRATORY_TRACT | 5 refills | Status: DC | PRN

## 2017-04-10 NOTE — Assessment & Plan Note (Signed)
CPAP noncompliance  Needs to wear each night

## 2017-04-10 NOTE — Patient Instructions (Addendum)
Extend Levaquin for 5 days . (folow up with coumadin clinic tomorrow as planned)  Mucinex DM Twice daily  As needed  Cough/congestion  Prednisone taper over next week.  Call if your blood sugar if >250.  Saline nasal rinses As needed   Restart ANORO 1 puff daily. (patient assistance papers completed).  Discuss with family doctor that Lisinopril may be making your cough worse.  Must quit smoking -this is key.  Follow up with Dr. Elsworth Soho  In 4-6 weeks and As needed   Please contact office for sooner follow up if symptoms do not improve or worsen or seek emergency care

## 2017-04-10 NOTE — Progress Notes (Signed)
@Patient  ID: [name], male    DOB: April 02, 1956, 61 y.o.   MRN: [id number]  Chief Complaint  Patient presents with  . Acute Visit    Cough     Referring provider: [name], MD  HPI: 61 year old male active smoker followed for COPD and obstructive sleep apnea Patient has chronic A. fib status post ablation in 2016 Has OSA -CPAP intolerant    Significant tests/ events  Spirometry 01/2010 showed ratio of 68, FEV 1 was 72%, smaller airways 55%, no significant BD response   PSG from [facility] - severe with AHI 31.h corrected by CPAP 5 cm (wt was 250 lbs)  11/2011 Underwent neck sx >>given pre-op prednisone for asthmatic bronchitis -uneventful post op   09/2012 FEV1 67%, no BD response  Spirometry 2015 with FEV1 59%, ratio 61   04/10/2017 Acute OV : Cough  Pt presents for an acute office visit. Complains over 7-10 days he has had increased sinus congestion, cough, drainage, wheezing and increased sob. Seen at urgent care and given prednisone taper . No sigificant improvement  Went back to urgent care on 04/07/17 told he had PNA and was started on Levaquin for 5 days . He is feeling some better but still has cough, wheezing and green sinus drainage. Says Mucus is very thick . He is still smoking , discussed smoking cessation .  CXR today shows clear lungs.   Depends on samples. Can not afford inhalers . Not taking ANORO or Stiolto .  We discussed options . Decided to fill out papers for assistance.   Has OSA but not weairng CPAP . Discussed compliance .   On ACE inhibitor with frequent cough and slow resolving bronchitic flares.  He is DM , pt educaiton on steroids .   Allergies  Allergen Reactions  . Adhesive [Tape]     itching  . Latex Itching    When tape is on the skin too long skin gets red & itching    Immunization History  Administered Date(s) Administered  . Influenza Split 01/30/2011, 03/05/2012, 02/13/2013, 02/03/2016  . Influenza Whole 02/13/2009    . Influenza,inj,Quad PF,6+ Mos 03/14/2014, 04/22/2015, 01/19/2016, 02/02/2017  . Pneumococcal Polysaccharide-23 02/14/2007    Past Medical History:  Diagnosis Date  . Arthritis    lower back  . Asthma   . Atrial flutter ([place])    s/p CTI ablation by Dr Rayann Heman  . Brain aneurysm 2009   questionable. A follow up CTA in 2009 showed no evidence of  . Chest pain    a. 08/2016 Neg MV, EF 54%.  . Chronic back pain    DDD/stenosis  . Colon polyps    9 polyps removed 10/13/11  . Complication of anesthesia September 11, 2012   slow to awaken after ablation  . Diabetes mellitus    takes Metformin and Glimepiride daily  . Emphysema   . GERD (gastroesophageal reflux disease)    takes Omeprazole bid  . History of shingles   . HLD (hyperlipidemia)    takes Pravastatin daily  . HTN (hypertension)    takes Prinizide daily  . Obesity   . OSA (obstructive sleep apnea)    not always using cpap  . Overdose 2009   unintentional Flecanide overdose  . Peripheral neuropathy   . Short-term memory loss   . Tobacco abuse   . Unstable angina ([place]) 08/26/2016    Tobacco History: Social History   Tobacco Use  Smoking Status Current Every Day Smoker  .  Packs/day: 0.30  . Years: 40.00  . Pack years: 12.00  . Types: Cigarettes  Smokeless Tobacco Never Used   Ready to quit: No Counseling given: Yes   Outpatient Encounter Medications as of 04/10/2017  Medication Sig  . albuterol (PROAIR HFA) 108 (90 Base) MCG/ACT inhaler Inhale 1-2 puffs into the lungs every 6 (six) hours as needed for wheezing or shortness of breath.  . bisoprolol (ZEBETA) 10 MG tablet Take 10 mg by mouth daily.  . Carboxymethylcellul-Glycerin (LUBRICATING EYE DROPS OP) Apply 1 drop to eye daily as needed (irritation).  . Cholecalciferol (VITAMIN D) 2000 UNITS CAPS Take 2,000 Units by mouth daily.   [name] diltiazem (CARDIZEM CD) 120 MG 24 hr capsule TAKE 1 CAPSULE BY MOUTH DAILY  . diltiazem (CARDIZEM) 60 MG tablet Take 30 mg by  mouth 2 (two) times daily as needed (afib).  [name] glimepiride (AMARYL) 1 MG tablet Take 1 mg by mouth daily before breakfast.   . hydrochlorothiazide (HYDRODIURIL) 25 MG tablet Take 25 mg by mouth daily.  [name] HYDROcodone-acetaminophen (NORCO) 7.5-325 MG tablet Take 1 tablet by mouth every 6 (six) hours as needed for moderate pain.  . isosorbide mononitrate (IMDUR) 30 MG 24 hr tablet Take 1 tablet (30 mg total) daily by mouth. Need appointment for more refills  . Liraglutide (VICTOZA) 18 MG/3ML SOPN Inject 1.2 mg into the skin daily.   [name] lisinopril (PRINIVIL,ZESTRIL) 40 MG tablet TAKE 1 TABLET BY MOUTH ONCE DAILY  . metFORMIN (GLUCOPHAGE) 1000 MG tablet Take 1,000 mg by mouth 2 (two) times daily.   . Multiple Vitamin (MULTIVITAMIN WITH MINERALS) TABS Take 1 tablet by mouth daily.  . nitroGLYCERIN (NITROSTAT) 0.4 MG SL tablet Place 1 tablet (0.4 mg total) under the tongue every 5 (five) minutes x 3 doses as needed for chest pain.  [name] omeprazole (PRILOSEC) 20 MG capsule Take 20 mg by mouth at bedtime.   . potassium chloride (KLOR-CON 10) 10 MEQ tablet Take 10 mEq by mouth daily.   . pravastatin (PRAVACHOL) 80 MG tablet TAKE 1 TABLET BY MOUTH EVERY NIGHT AT BEDTIME  . sildenafil (REVATIO) 20 MG tablet Take 20-80 mg by mouth daily as needed (erectile dysfunction).  . Tiotropium Bromide-Olodaterol (STIOLTO RESPIMAT) 2.5-2.5 MCG/ACT AERS Inhale 2 puffs into the lungs daily.  [name] umeclidinium-vilanterol (ANORO ELLIPTA) 62.5-25 MCG/INH AEPB Inhale 1 puff into the lungs daily.  [name] warfarin (COUMADIN) 5 MG tablet TAKE 1 TO 1 AND 1/2 TABLETS BY MOUTH DAILY AS DIRECTED BY COUMADIN CLINIC  . levofloxacin (LEVAQUIN) 500 MG tablet Take 1 tablet (500 mg total) by mouth daily for 5 days.  . predniSONE (DELTASONE) 10 MG tablet 4 tabs for 2 days, then 3 tabs for 2 days, 2 tabs for 2 days, then 1 tab for 2 days, then stop   No facility-administered encounter medications on file as of 04/10/2017.      Review of  Systems  Constitutional:   No  weight loss, night sweats,  Fevers, chills, fatigue, or  lassitude.  HEENT:   No headaches,  Difficulty swallowing,  Tooth/dental problems, or  Sore throat,                No sneezing, itching, ear ache,  +nasal congestion, post nasal drip,   CV:  No chest pain,  Orthopnea, PND, swelling in lower extremities, anasarca, dizziness, palpitations, syncope.   GI  No heartburn, indigestion, abdominal pain, nausea, vomiting, diarrhea, change in bowel habits, loss of appetite, bloody stools.   Resp:  No chest wall deformity  Skin: no rash or lesions.  GU: no dysuria, change in color of urine, no urgency or frequency.  No flank pain, no hematuria   MS:  No joint pain or swelling.  No decreased range of motion.  No back pain.    Physical Exam  BP 134/66 (BP Location: Left Arm, Cuff Size: Normal)   Pulse 78   Ht 6\' 3"  (1.905 m)   Wt 241 lb (109.3 kg)   SpO2 95%   BMI 30.12 kg/m   GEN: A/Ox3; pleasant , NAD, elderly    HEENT:  [place]/AT,  EACs-clear, TMs-wnl, NOSE-clear drainage , THROAT-clear, no lesions, no postnasal drip or exudate noted.   NECK:  Supple w/ fair ROM; no JVD; normal carotid impulses w/o bruits; no thyromegaly or nodules palpated; no lymphadenopathy.    RESP  Few trace exp wheezing,   no accessory muscle use, no dullness to percussion  CARD:  RRR, no m/r/g, no peripheral edema, pulses intact, no cyanosis or clubbing.  GI:   Soft & nt; nml bowel sounds; no organomegaly or masses detected.   Musco: Warm bil, no deformities or joint swelling noted.   Neuro: alert, no focal deficits noted.    Skin: Warm, no lesions or rashes    Lab Results:  CBC  BNP  Dg Chest 2 View  Result Date: 04/10/2017 CLINICAL DATA:  Follow-up pneumonia.  Cough and wheezing. EXAM: CHEST  2 VIEW COMPARISON:  03/06/2016. FINDINGS: Trachea is midline. Heart size within normal limits. Thoracic aorta is calcified. Lungs are clear. No pleural fluid.  IMPRESSION: 1. No acute findings. 2.  Aortic atherosclerosis (ICD10-170.0). Electronically Signed   By: Lorin Picket M.D.   On: 04/10/2017 09:33     Assessment & Plan:   No problem-specific Assessment & Plan notes found for this encounter.     Rexene Edison, NP 04/10/2017

## 2017-04-10 NOTE — Assessment & Plan Note (Signed)
Discuss  With PCP that ACE be aggravating cough  He is high risk for exacerbation with ongoing smoking .

## 2017-04-10 NOTE — Addendum Note (Signed)
Addended by: Parke Poisson E on: 04/10/2017 10:55 AM   Modules accepted: Orders

## 2017-04-10 NOTE — Assessment & Plan Note (Signed)
Slow to resolve exacerbation with sinusiitis /Bronchitis  CXR w/ no PNA   Plan  Patient Instructions  Extend Levaquin for 5 days . (folow up with coumadin clinic tomorrow as planned)  Mucinex DM Twice daily  As needed  Cough/congestion  Prednisone taper over next week.  Call if your blood sugar if >250.  Saline nasal rinses As needed   Restart ANORO 1 puff daily. (patient assistance papers completed).  Discuss with family doctor that Lisinopril may be making your cough worse.  Must quit smoking -this is key.  Follow up with Dr. Elsworth Soho  In 4-6 weeks and As needed   Please contact office for sooner follow up if symptoms do not improve or worsen or seek emergency care

## 2017-04-11 ENCOUNTER — Ambulatory Visit (INDEPENDENT_AMBULATORY_CARE_PROVIDER_SITE_OTHER): Payer: BLUE CROSS/BLUE SHIELD | Admitting: *Deleted

## 2017-04-11 DIAGNOSIS — Z5181 Encounter for therapeutic drug level monitoring: Secondary | ICD-10-CM

## 2017-04-11 DIAGNOSIS — I480 Paroxysmal atrial fibrillation: Secondary | ICD-10-CM | POA: Diagnosis not present

## 2017-04-11 LAB — POCT INR: INR: 2

## 2017-04-11 NOTE — Progress Notes (Signed)
HPI The patient presents for followup of atrial flutter and atrial fibrillation.  He is status post flutter ablation.  He was not a candidate for amio or flecainide.  I sent him to the atrial fib clinic. He was on Multaq for awhile but now has been taken off of that because his atrial fib burden was low.  He had chest pain at the last visit and he had a cath which demonstrated normal coronaries.    Most recently he has been treated for some sinus problems and apparent lung infection.  He is on antibiotics and steroids.  He saw pulmonary and they suggested that he might want to switch off of lisinopril in case this was contributing to any of his chronic lung disease issues.  He has not had any new chest pressure, neck or arm discomfort.  He has some chronic left shoulder pain.  He is limited by his breathing but he can do a little bit more than he used to.  He has had occasional palpitations but recently was given something for anxiety which he says it helps more than anything.  He is not had any sustained tachypalpitations.  He has not had any further symptoms such as he had prior to when I saw him last time.  He has not had any PND or orthopnea.  He has had no weight gain or edema.   Allergies  Allergen Reactions  . Adhesive [Tape]     itching  . Latex Itching    When tape is on the skin too long skin gets red & itching    Current Outpatient Medications  Medication Sig Dispense Refill  . albuterol (PROAIR HFA) 108 (90 Base) MCG/ACT inhaler Inhale 1-2 puffs into the lungs every 6 (six) hours as needed for wheezing or shortness of breath. 1 Inhaler 5  . bisoprolol (ZEBETA) 10 MG tablet Take 10 mg by mouth daily.    . Carboxymethylcellul-Glycerin (LUBRICATING EYE DROPS OP) Apply 1 drop to eye daily as needed (irritation).    . Cholecalciferol (VITAMIN D) 2000 UNITS CAPS Take 2,000 Units by mouth daily.     [name] diltiazem (CARDIZEM CD) 120 MG 24 hr capsule TAKE 1 CAPSULE BY MOUTH DAILY 30 capsule  6  . diltiazem (CARDIZEM) 60 MG tablet Take 30 mg by mouth 2 (two) times daily as needed (afib).    [name] glimepiride (AMARYL) 1 MG tablet Take 1 mg by mouth daily before breakfast.     . hydrochlorothiazide (HYDRODIURIL) 25 MG tablet Take 25 mg by mouth daily.    [name] HYDROcodone-acetaminophen (NORCO) 7.5-325 MG tablet Take 1 tablet by mouth every 6 (six) hours as needed for moderate pain.    . isosorbide mononitrate (IMDUR) 30 MG 24 hr tablet Take 1 tablet (30 mg total) daily by mouth. Need appointment for more refills 30 tablet 1  . levofloxacin (LEVAQUIN) 500 MG tablet Take 1 tablet (500 mg total) by mouth daily for 5 days. 5 tablet 0  . Liraglutide (VICTOZA) 18 MG/3ML SOPN Inject 1.2 mg into the skin daily.     . metFORMIN (GLUCOPHAGE) 1000 MG tablet Take 1,000 mg by mouth 2 (two) times daily.     . Multiple Vitamin (MULTIVITAMIN WITH MINERALS) TABS Take 1 tablet by mouth daily.    . nitroGLYCERIN (NITROSTAT) 0.4 MG SL tablet Place 1 tablet (0.4 mg total) under the tongue every 5 (five) minutes x 3 doses as needed for chest pain. 25 tablet 3  . omeprazole (PRILOSEC)  20 MG capsule Take 20 mg by mouth at bedtime.     . potassium chloride (KLOR-CON 10) 10 MEQ tablet Take 10 mEq by mouth daily.     . pravastatin (PRAVACHOL) 80 MG tablet TAKE 1 TABLET BY MOUTH EVERY NIGHT AT BEDTIME 30 tablet 11  . predniSONE (DELTASONE) 10 MG tablet 4 tabs for 2 days, then 3 tabs for 2 days, 2 tabs for 2 days, then 1 tab for 2 days, then stop 20 tablet 0  . sildenafil (REVATIO) 20 MG tablet Take 20-80 mg by mouth daily as needed (erectile dysfunction).    [name] umeclidinium-vilanterol (ANORO ELLIPTA) 62.5-25 MCG/INH AEPB Inhale 1 puff into the lungs daily. 14 each 0  . warfarin (COUMADIN) 5 MG tablet TAKE 1 TO 1 AND 1/2 TABLETS BY MOUTH DAILY AS DIRECTED BY COUMADIN CLINIC 45 tablet 2  . losartan (COZAAR) 50 MG tablet Take 1 tablet (50 mg total) by mouth daily. 90 tablet 3   No current facility-administered medications for  this visit.     Past Medical History:  Diagnosis Date  . Arthritis    lower back  . Asthma   . Atrial flutter ([place])    s/p CTI ablation by Dr Rayann Heman  . Brain aneurysm 2009   questionable. A follow up CTA in 2009 showed no evidence of  . Chronic back pain    DDD/stenosis  . Colon polyps    9 polyps removed 10/13/11  . Complication of anesthesia September 11, 2012   slow to awaken after ablation  . Diabetes mellitus    takes Metformin and Glimepiride daily  . Emphysema   . GERD (gastroesophageal reflux disease)    takes Omeprazole bid  . History of shingles   . HLD (hyperlipidemia)    takes Pravastatin daily  . HTN (hypertension)    takes Prinizide daily  . Obesity   . OSA (obstructive sleep apnea)    not always using cpap  . Overdose 2009   unintentional Flecanide overdose  . Peripheral neuropathy   . Short-term memory loss   . Tobacco abuse     Past Surgical History:  Procedure Laterality Date  . ANTERIOR CERVICAL DECOMP/DISCECTOMY FUSION  01/04/2012   Procedure: ANTERIOR CERVICAL DECOMPRESSION/DISCECTOMY FUSION 1 LEVEL/HARDWARE REMOVAL;  Surgeon: Ophelia Charter, MD;  Location: [place] NEURO ORS;  Service: Neurosurgery;  Laterality: N/A;  explore cervical fusion Cervical six - seven  with removal of codman plate anterior cervical decompression with fusion interbody prothesis plating and bonegraft  . APPENDECTOMY  2-20yrs ago  . ATRIAL FIBRILLATION ABLATION N/A 09/11/2012   PT DID NOT HAVE AN ATRIAL FIBRILLATION ABLATION IN 2014!  ATRIAL FLUTTER ABLATION ONLY  . ATRIAL FLUTTER ABLATION  09/11/2012   CTI ablation by Dr Rayann Heman  . CARDIAC CATHETERIZATION  2008   no significant CAD  . CARDIOVERSION  05/05/2011   Procedure: CARDIOVERSION;  Surgeon: Lelon Perla, MD;  Location: [facility] ENDOSCOPY;  Service: Cardiovascular;  Laterality: N/A;  . CARDIOVERSION Bilateral 07/26/2012   Procedure: CARDIOVERSION;  Surgeon: Minus Breeding, MD;  Location: [facility] ENDOSCOPY;  Service: Cardiovascular;   Laterality: Bilateral;  . CARPAL TUNNEL RELEASE  99/2000   bilateral  . COLONOSCOPY WITH PROPOFOL N/A 12/13/2012   Procedure: COLONOSCOPY WITH PROPOFOL;  Surgeon: Milus Banister, MD;  Location: [facility] ENDOSCOPY;  Service: Endoscopy;  Laterality: N/A;  . ELECTROPHYSIOLOGIC STUDY N/A 04/21/2015   Procedure: Atrial Fibrillation Ablation;  Surgeon: Thompson Grayer, MD;  Location: [place] CV LAB;  Service: Cardiovascular;  Laterality: N/A;  . HERNIA REPAIR    . KNEE SURGERY  6-42yrs ago   left  . LEFT HEART CATH AND CORONARY ANGIOGRAPHY N/A 09/19/2016   Procedure: Left Heart Cath and Coronary Angiography;  Surgeon: Belva Crome, MD;  Location: [place] CV LAB;  Service: Cardiovascular;  Laterality: N/A;  . Left inguinal hernia repair     as a child  . NASAL SEPTOPLASTY W/ TURBINOPLASTY Bilateral 02/19/2014   Procedure: NASAL SEPTOPLASTY WITH BILATERAL TURBINATE REDUCTION;  Surgeon: Jodi Marble, MD;  Location: [place];  Service: ENT;  Laterality: Bilateral;  . NECK SURGERY  36yrs ago  . right shoulder surgery  4-33yrs ago   cyst removed  . TEE WITHOUT CARDIOVERSION  05/05/2011   Procedure: TRANSESOPHAGEAL ECHOCARDIOGRAM (TEE);  Surgeon: Lelon Perla, MD;  Location: [facility] ENDOSCOPY;  Service: Cardiovascular;  Laterality: N/A;  . TEE WITHOUT CARDIOVERSION N/A 04/21/2015   Procedure: TRANSESOPHAGEAL ECHOCARDIOGRAM (TEE);  Surgeon: Sueanne Margarita, MD;  Location: [facility] ENDOSCOPY;  Service: Cardiovascular;  Laterality: N/A;  . THROAT SURGERY  4-60yrs ago   "thought " it was cancer but came back not  . TONSILLECTOMY     Social History   Socioeconomic History  . Marital status: Married    Spouse name: Not on file  . Number of children: 2  . Years of education: Not on file  . Highest education level: Not on file  Social Needs  . Financial resource strain: Not on file  . Food insecurity - worry: Not on file  . Food insecurity - inability: Not on file  . Transportation needs - medical: Not on file  .  Transportation needs - non-medical: Not on file  Occupational History  . Occupation: Dealer   Tobacco Use  . Smoking status: Current Every Day Smoker    Packs/day: 0.30    Years: 40.00    Pack years: 12.00    Types: Cigarettes  . Smokeless tobacco: Never Used  Substance and Sexual Activity  . Alcohol use: Yes    Alcohol/week: 0.0 oz    Comment: couple of times a month liquor, he tells me he typically drinks 2-4 drinks most days but hasnt drank anything in 6 weeks  . Drug use: No  . Sexual activity: Yes  Other Topics Concern  . Not on file  Social History Narrative   Daily caffeine(Mountain Dew)       Lives in [place] with spouse.   Unemployed due to chronic back/ leg pain         Family History  Problem Relation Age of Onset  . Colon cancer Neg Hx   . Anesthesia problems Neg Hx   . Hypotension Neg Hx   . Malignant hyperthermia Neg Hx   . Pseudochol deficiency Neg Hx     ROS: Sciatic nerve pain.    Otherwise as stated in the HPI and negative for all other systems.  PHYSICAL EXAM BP 135/76   Pulse 87   Ht 6\' 3"  (1.905 m)   Wt 241 lb 9.6 oz (109.6 kg)   SpO2 93%   BMI 30.20 kg/m   GENERAL:  Well appearing NECK:  No jugular venous distention, waveform within normal limits, carotid upstroke brisk and symmetric, no bruits, no thyromegaly LUNGS:   Decreased breath sounds with expiratory wheezing.  CHEST:  Unremarkable HEART:  PMI not displaced or sustained,S1 and S2 within normal limits, no S3, no S4, no clicks, no rubs, no murmurs ABD:  Flat, positive bowel  sounds normal in frequency in pitch, no bruits, no rebound, no guarding, no midline pulsatile mass, no hepatomegaly, no splenomegaly EXT:  2 plus pulses throughout, no edema, no cyanosis no clubbing    EKG:  Sinus rhythm, rate not, axis within normal limits, intervals within normal limits, lateral T-wave inversions new.   04/12/2017  ASSESSMENT AND PLAN  CHEST PAIN:   He is having no further chest  pain.  No further evaluation is indicated.  EMPHYSEMA: This is made worse by his continued tobacco use but he is trying to quit.  I will go ahead and stop his lisinopril and start Cozaar 50 mg daily this is unlikely this  ATRIAL FLUTTER/FIB:   Mr. [name] has a CHA2DS2 - VASc score of 2.  Continues with anticoagulation.  No other change in therapy is indicated.  HTN:   BP is controlled.  He will continue the meds as listed.  TOBACCO ABUSE:    We had a discussion again about the need to stop smoking.  He is trying to cut back.  He will pray about it.   ALCOHOL USE:  Last drink Dec of 2015   He is still abstaining.

## 2017-04-11 NOTE — Patient Instructions (Signed)
While on antibiotic and Prednisone continue taking 1 pill of Coumadin everyday.  Recheck in 1 weeks.  Call with any new medications or scheduled for any procedures [phone]

## 2017-04-12 ENCOUNTER — Encounter: Payer: Self-pay | Admitting: Cardiology

## 2017-04-12 ENCOUNTER — Ambulatory Visit (INDEPENDENT_AMBULATORY_CARE_PROVIDER_SITE_OTHER): Payer: BLUE CROSS/BLUE SHIELD | Admitting: Cardiology

## 2017-04-12 VITALS — BP 135/76 | HR 87 | Ht 75.0 in | Wt 241.6 lb

## 2017-04-12 DIAGNOSIS — I483 Typical atrial flutter: Secondary | ICD-10-CM | POA: Diagnosis not present

## 2017-04-12 DIAGNOSIS — R0602 Shortness of breath: Secondary | ICD-10-CM | POA: Diagnosis not present

## 2017-04-12 DIAGNOSIS — I10 Essential (primary) hypertension: Secondary | ICD-10-CM | POA: Diagnosis not present

## 2017-04-12 DIAGNOSIS — Z720 Tobacco use: Secondary | ICD-10-CM | POA: Diagnosis not present

## 2017-04-12 MED ORDER — LOSARTAN POTASSIUM 50 MG PO TABS: 50.0000 mg | ORAL_TABLET | Freq: Every day | ORAL | 3 refills | Status: DC

## 2017-04-12 NOTE — Patient Instructions (Signed)
Medication Instructions:  STOP-Lisinopril START- Losartan 50 mg daily  If you need a refill on your cardiac medications before your next appointment, please call your pharmacy.  Labwork: None Ordered   Testing/Procedures: None Ordered  Follow-Up: Your physician wants you to follow-up in: 1 Year. You should receive a reminder letter in the mail two months in advance. If you do not receive a letter, please call our office [phone].   Thank you for choosing [facility]!!

## 2017-04-14 NOTE — Progress Notes (Signed)
Reviewed & agree with plan  

## 2017-04-17 ENCOUNTER — Ambulatory Visit (INDEPENDENT_AMBULATORY_CARE_PROVIDER_SITE_OTHER): Payer: BLUE CROSS/BLUE SHIELD | Admitting: *Deleted

## 2017-04-17 DIAGNOSIS — Z5181 Encounter for therapeutic drug level monitoring: Secondary | ICD-10-CM

## 2017-04-17 DIAGNOSIS — I480 Paroxysmal atrial fibrillation: Secondary | ICD-10-CM | POA: Diagnosis not present

## 2017-04-17 LAB — POCT INR: INR: 1.8

## 2017-04-17 NOTE — Patient Instructions (Signed)
Today take 1.5 pills and resume taking 1 pill of Coumadin everyday except 1.5 pills on Monday, Wednesday, and Friday.  Recheck in 2 weeks.  Call with any new medications or scheduled for any procedures [phone]

## 2017-04-28 ENCOUNTER — Other Ambulatory Visit: Payer: Self-pay | Admitting: Neurosurgery

## 2017-04-28 DIAGNOSIS — G8929 Other chronic pain: Secondary | ICD-10-CM

## 2017-04-28 DIAGNOSIS — M5442 Lumbago with sciatica, left side: Principal | ICD-10-CM

## 2017-05-01 ENCOUNTER — Ambulatory Visit (INDEPENDENT_AMBULATORY_CARE_PROVIDER_SITE_OTHER): Payer: BLUE CROSS/BLUE SHIELD

## 2017-05-01 DIAGNOSIS — I480 Paroxysmal atrial fibrillation: Secondary | ICD-10-CM | POA: Diagnosis not present

## 2017-05-01 DIAGNOSIS — Z5181 Encounter for therapeutic drug level monitoring: Secondary | ICD-10-CM

## 2017-05-01 LAB — POCT INR: INR: 2.4

## 2017-05-01 NOTE — Patient Instructions (Signed)
Continue on same dosage 1 pill of Coumadin everyday except 1.5 pills on Mondays, Wednesdays, and Fridays.  Recheck in 3 weeks.  Call with any new medications or scheduled for any procedures [phone]

## 2017-05-02 ENCOUNTER — Encounter: Payer: Self-pay | Admitting: Pulmonary Disease

## 2017-05-02 ENCOUNTER — Ambulatory Visit (INDEPENDENT_AMBULATORY_CARE_PROVIDER_SITE_OTHER): Payer: BLUE CROSS/BLUE SHIELD | Admitting: Pulmonary Disease

## 2017-05-02 DIAGNOSIS — J449 Chronic obstructive pulmonary disease, unspecified: Secondary | ICD-10-CM | POA: Diagnosis not present

## 2017-05-02 NOTE — Progress Notes (Signed)
   Subjective:    Patient ID: [name], male    DOB: [birth year], 61 y.o.   MRN: [id number]  HPI  PCP - [name]   61/M, smoker for FU of COPD   He has chronic atrial fibrillation,s/p  TEE/DCCV in the past , ablation 04/2015.   Here today for follow after recent COPD exacerbation last month. Seen 11/26 here and treated with extended course of levaquin and steroids.   Currently on oral steroids for "shoulder pain"  Reports symptoms are well controlled with an needed albuterol, which he uses once every other week. Currently smoking 3-4 cigarettes a day.   He was diagnosed with OSA and has a CPAP machine but does not use. Reports his insomnia is secondary to chronic pain. He was unable to tolerate full face mask in the past. He is resistant to trying it    Significant tests/ events  Spirometry 01/2010 showed ratio of 68, FEV 1 was 72%, smaller airways 55%, no significant BD response   PSG from [facility] - severe with AHI 31.h corrected by CPAP 5 cm (wt was 250 lbs)  11/2011 Underwent neck sx >>given pre-op prednisone for asthmatic bronchitis -uneventful post op   Spirometry fev1 68%  09/2012 FEV1 67%, no BD response  Spirometry 2015 with FEV1 59%, ratio 61   PFTs 09/2015: FEV1/FVC 67%, FEV1 65, FVC 64  Review of Systems neg for any significant sore throat, dysphagia, itching, sneezing, nasal congestion or excess/ purulent secretions, fever, chills, sweats, unintended wt loss, pleuritic or exertional cp, hempoptysis, orthopnea pnd or change in chronic leg swelling. Also denies presyncope, palpitations, heartburn, abdominal pain, nausea, vomiting, diarrhea or change in bowel or urinary habits, dysuria,hematuria, rash, arthralgias, visual complaints, headache, numbness weakness or ataxia.     Objective:   Physical Exam   Gen. Pleasant, well-nourished, in no distress ENT - no thrush, no post nasal drip Neck: No JVD, no thyromegaly, no carotid bruits Lungs:  no use of accessory muscles, no dullness to percussion, clear without rales or rhonchi, Mild expiratory wheeze bilaterally  Cardiovascular: Rhythm regular, heart sounds  normal, no murmurs or gallops, no peripheral edema Musculoskeletal: No deformities, no cyanosis or clubbing

## 2017-05-02 NOTE — Patient Instructions (Signed)
Work on quitting smoking completely. Use albuterol as needed for shortness of breath.  Calluses needed

## 2017-05-02 NOTE — Assessment & Plan Note (Signed)
Recovered from recent exacerbation one month ago. Only one exacerbation in the past year. Currently GOLD class II with FEV1 65% but feels symptoms are currently well controlled off maintenance inhalers. Uses albuterol inhaler once every other week. Continues to smoke 3-4 cigarettes a day. Encouraged cessation. Continue with PRN albuterol for now. Follow up 6 months.

## 2017-05-03 ENCOUNTER — Other Ambulatory Visit: Payer: Self-pay | Admitting: Cardiology

## 2017-05-06 ENCOUNTER — Ambulatory Visit
Admission: RE | Admit: 2017-05-06 | Discharge: 2017-05-06 | Disposition: A | Discharge status: Home / Self Care | Payer: BLUE CROSS/BLUE SHIELD | Source: Ambulatory Visit | Attending: Neurosurgery | Admitting: Neurosurgery

## 2017-05-06 DIAGNOSIS — G8929 Other chronic pain: Secondary | ICD-10-CM

## 2017-05-06 DIAGNOSIS — M5442 Lumbago with sciatica, left side: Principal | ICD-10-CM

## 2017-05-10 ENCOUNTER — Other Ambulatory Visit: Payer: Self-pay | Admitting: Cardiology

## 2017-05-18 ENCOUNTER — Other Ambulatory Visit: Payer: Self-pay | Admitting: Cardiology

## 2017-05-22 ENCOUNTER — Encounter (INDEPENDENT_AMBULATORY_CARE_PROVIDER_SITE_OTHER): Payer: Self-pay

## 2017-05-22 ENCOUNTER — Ambulatory Visit (INDEPENDENT_AMBULATORY_CARE_PROVIDER_SITE_OTHER): Payer: BLUE CROSS/BLUE SHIELD | Admitting: *Deleted

## 2017-05-22 DIAGNOSIS — I480 Paroxysmal atrial fibrillation: Secondary | ICD-10-CM | POA: Diagnosis not present

## 2017-05-22 DIAGNOSIS — I4811 Longstanding persistent atrial fibrillation: Secondary | ICD-10-CM

## 2017-05-22 DIAGNOSIS — I481 Persistent atrial fibrillation: Secondary | ICD-10-CM

## 2017-05-22 DIAGNOSIS — Z5181 Encounter for therapeutic drug level monitoring: Secondary | ICD-10-CM | POA: Diagnosis not present

## 2017-05-22 LAB — POCT INR: INR: 2.3

## 2017-05-22 NOTE — Patient Instructions (Signed)
Description   Continue on same dosage 1 pill of Coumadin everyday except 1.5 pills on Mondays, Wednesdays, and Fridays.  Recheck in 4 weeks.  Call with any new medications or scheduled for any procedures #[phone]      

## 2017-05-23 ENCOUNTER — Other Ambulatory Visit: Payer: Self-pay | Admitting: Nurse Practitioner

## 2017-06-19 ENCOUNTER — Ambulatory Visit (INDEPENDENT_AMBULATORY_CARE_PROVIDER_SITE_OTHER): Payer: BLUE CROSS/BLUE SHIELD | Admitting: Pharmacist

## 2017-06-19 DIAGNOSIS — Z5181 Encounter for therapeutic drug level monitoring: Secondary | ICD-10-CM

## 2017-06-19 DIAGNOSIS — I480 Paroxysmal atrial fibrillation: Secondary | ICD-10-CM

## 2017-06-19 LAB — POCT INR: INR: 2.5

## 2017-06-19 NOTE — Patient Instructions (Signed)
Description   Continue on same dosage 1 pill of Coumadin everyday except 1.5 pills on Mondays, Wednesdays, and Fridays.  Recheck in 5 weeks.  Call with any new medications or scheduled for any procedures [phone]

## 2017-07-24 ENCOUNTER — Ambulatory Visit (INDEPENDENT_AMBULATORY_CARE_PROVIDER_SITE_OTHER): Payer: BLUE CROSS/BLUE SHIELD | Admitting: *Deleted

## 2017-07-24 DIAGNOSIS — Z5181 Encounter for therapeutic drug level monitoring: Secondary | ICD-10-CM

## 2017-07-24 DIAGNOSIS — I480 Paroxysmal atrial fibrillation: Secondary | ICD-10-CM | POA: Diagnosis not present

## 2017-07-24 LAB — POCT INR: INR: 1.7

## 2017-07-24 NOTE — Patient Instructions (Signed)
Description   Today take 2 pills then continue on same dosage 1 pill of Coumadin everyday except 1.5 pills on Mondays, Wednesdays, and Fridays.  Recheck in 3 weeks.  Call with any new medications or scheduled for any procedures [phone]

## 2017-08-18 ENCOUNTER — Encounter: Payer: Self-pay | Admitting: Physician Assistant

## 2017-08-18 ENCOUNTER — Ambulatory Visit (INDEPENDENT_AMBULATORY_CARE_PROVIDER_SITE_OTHER): Payer: BLUE CROSS/BLUE SHIELD | Admitting: Pharmacist Clinician (PhC)/ Clinical Pharmacy Specialist

## 2017-08-18 ENCOUNTER — Ambulatory Visit (INDEPENDENT_AMBULATORY_CARE_PROVIDER_SITE_OTHER): Payer: BLUE CROSS/BLUE SHIELD | Admitting: Physician Assistant

## 2017-08-18 VITALS — BP 132/68 | HR 76 | Ht 74.0 in | Wt 245.0 lb

## 2017-08-18 DIAGNOSIS — Z5181 Encounter for therapeutic drug level monitoring: Secondary | ICD-10-CM

## 2017-08-18 DIAGNOSIS — E119 Type 2 diabetes mellitus without complications: Secondary | ICD-10-CM

## 2017-08-18 DIAGNOSIS — I480 Paroxysmal atrial fibrillation: Secondary | ICD-10-CM | POA: Diagnosis not present

## 2017-08-18 DIAGNOSIS — K921 Melena: Secondary | ICD-10-CM

## 2017-08-18 DIAGNOSIS — E785 Hyperlipidemia, unspecified: Secondary | ICD-10-CM | POA: Diagnosis not present

## 2017-08-18 LAB — POCT INR: INR: 2.5

## 2017-08-18 MED ORDER — DILTIAZEM HCL ER COATED BEADS 180 MG PO CP24: 120.0000 mg | ORAL_CAPSULE | Freq: Every day | ORAL | 3 refills | Status: DC

## 2017-08-18 NOTE — Patient Instructions (Addendum)
Medication Instructions:  INCREASE- Cardizem 180 mg daily  If you need a refill on your cardiac medications before your next appointment, please call your pharmacy.  Labwork: CBC, CMP, Fasting Lipids and Hgb A1c HERE IN OUR OFFICE AT [facility]  Take the provided lab slips for you to take with you to the lab for you blood draw.   You will need to fast. DO NOT EAT OR DRINK PAST MIDNIGHT.   Testing/Procedures: None Ordered  Follow-Up: Your physician wants you to follow-up in: 6 Months. You should receive a reminder letter in the mail two months in advance. If you do not receive a letter, please call our office [phone].     Thank you for choosing [facility] at [facility]

## 2017-08-18 NOTE — Progress Notes (Signed)
Cardiology Office Note   Date:  08/18/2017   ID:  [name], DOB [birth year], MRN [id number]  PCP:  [name], MD  Cardiologist: Dr. Percival Spanish, 04/12/2017 Rosaria Ferries, PA-C    History of Present Illness: [name] is a 62 y.o. male with a history of aflutter s/p ablation, afib, GERD, HTN, HLD, obesity, tob use, nl cors at cath 09/2016, CHA2DS2 - VASc score of 2 (HTN, CHF), on coumadin  [name] presents for cardiology follow up.  He has been having episodes of palpitations. They will generally last a few seconds, but some last a minute or more. The longer episodes give him symptoms, weakness and a little SOB, he has to rest. He tends to get them multiple times a week, more often when he is busy.   He notes orthostatic dizziness when he bends over and stands up. He drinks 2-1/2 liter bottles of water daily and 2- 20 oz bottles of Gatorade also.   When he has some pedal edema, his feet don't hurt. He is wakened by the foot and ankle pain during the night, it will take an hour or more to go away. His feet are puffy at bedtime, but go down during the night, that is when the pain starts.   He is trying to stay busy, does not want to sit. However, back problems limit him as well.   He has had gut problems, has had some black stools and also blood in stools. He has had colonoscopies in the past by Dr Ardis Hughs. This was going on for about a month, it has improved in the past week.    His blood sugars are not well controlled.  He does not know much about counting carbohydrates and admits he is forgotten most everything he learned when he saw the diabetic nutritionist.  A blood sugar of 200 is good for him.   Past Medical History:  Diagnosis Date  . Arthritis    lower back  . Asthma   . Atrial flutter ([place])    s/p CTI ablation by Dr Rayann Heman  . Brain aneurysm 2009   questionable. A follow up CTA in 2009 showed no evidence of  . Chronic back pain    DDD/stenosis  .  Colon polyps    9 polyps removed 10/13/11  . Complication of anesthesia September 11, 2012   slow to awaken after ablation  . Diabetes mellitus    takes Metformin and Glimepiride daily  . Emphysema   . GERD (gastroesophageal reflux disease)    takes Omeprazole bid  . History of shingles   . HLD (hyperlipidemia)    takes Pravastatin daily  . HTN (hypertension)    takes Prinizide daily  . Obesity   . OSA (obstructive sleep apnea)    not always using cpap  . Overdose 2009   unintentional Flecanide overdose  . Peripheral neuropathy   . Short-term memory loss   . Tobacco abuse     Past Surgical History:  Procedure Laterality Date  . ANTERIOR CERVICAL DECOMP/DISCECTOMY FUSION  01/04/2012   Procedure: ANTERIOR CERVICAL DECOMPRESSION/DISCECTOMY FUSION 1 LEVEL/HARDWARE REMOVAL;  Surgeon: Ophelia Charter, MD;  Location: [place] NEURO ORS;  Service: Neurosurgery;  Laterality: N/A;  explore cervical fusion Cervical six - seven  with removal of codman plate anterior cervical decompression with fusion interbody prothesis plating and bonegraft  . APPENDECTOMY  2-33yrs ago  . ATRIAL FIBRILLATION ABLATION N/A 09/11/2012   PT DID NOT HAVE AN  ATRIAL FIBRILLATION ABLATION IN 2014!  ATRIAL FLUTTER ABLATION ONLY  . ATRIAL FLUTTER ABLATION  09/11/2012   CTI ablation by Dr Rayann Heman  . CARDIAC CATHETERIZATION  2008   no significant CAD  . CARDIOVERSION  05/05/2011   Procedure: CARDIOVERSION;  Surgeon: Lelon Perla, MD;  Location: [facility] ENDOSCOPY;  Service: Cardiovascular;  Laterality: N/A;  . CARDIOVERSION Bilateral 07/26/2012   Procedure: CARDIOVERSION;  Surgeon: Minus Breeding, MD;  Location: [facility] ENDOSCOPY;  Service: Cardiovascular;  Laterality: Bilateral;  . CARPAL TUNNEL RELEASE  99/2000   bilateral  . COLONOSCOPY WITH PROPOFOL N/A 12/13/2012   Procedure: COLONOSCOPY WITH PROPOFOL;  Surgeon: Milus Banister, MD;  Location: [facility] ENDOSCOPY;  Service: Endoscopy;  Laterality: N/A;  . ELECTROPHYSIOLOGIC STUDY N/A  04/21/2015   Procedure: Atrial Fibrillation Ablation;  Surgeon: Thompson Grayer, MD;  Location: [place] CV LAB;  Service: Cardiovascular;  Laterality: N/A;  . HERNIA REPAIR    . KNEE SURGERY  6-63yrs ago   left  . LEFT HEART CATH AND CORONARY ANGIOGRAPHY N/A 09/19/2016   Procedure: Left Heart Cath and Coronary Angiography;  Surgeon: Belva Crome, MD;  Location: [place] CV LAB;  Service: Cardiovascular;  Laterality: N/A;  . Left inguinal hernia repair     as a child  . NASAL SEPTOPLASTY W/ TURBINOPLASTY Bilateral 02/19/2014   Procedure: NASAL SEPTOPLASTY WITH BILATERAL TURBINATE REDUCTION;  Surgeon: Jodi Marble, MD;  Location: [place];  Service: ENT;  Laterality: Bilateral;  . NECK SURGERY  21yrs ago  . right shoulder surgery  4-19yrs ago   cyst removed  . TEE WITHOUT CARDIOVERSION  05/05/2011   Procedure: TRANSESOPHAGEAL ECHOCARDIOGRAM (TEE);  Surgeon: Lelon Perla, MD;  Location: [facility] ENDOSCOPY;  Service: Cardiovascular;  Laterality: N/A;  . TEE WITHOUT CARDIOVERSION N/A 04/21/2015   Procedure: TRANSESOPHAGEAL ECHOCARDIOGRAM (TEE);  Surgeon: Sueanne Margarita, MD;  Location: [facility] ENDOSCOPY;  Service: Cardiovascular;  Laterality: N/A;  . THROAT SURGERY  4-74yrs ago   "thought " it was cancer but came back not  . TONSILLECTOMY      Current Outpatient Medications  Medication Sig Dispense Refill  . albuterol (PROAIR HFA) 108 (90 Base) MCG/ACT inhaler Inhale 1-2 puffs into the lungs every 6 (six) hours as needed for wheezing or shortness of breath. 1 Inhaler 5  . bisoprolol (ZEBETA) 10 MG tablet Take 10 mg by mouth daily.    . Carboxymethylcellul-Glycerin (LUBRICATING EYE DROPS OP) Apply 1 drop to eye daily as needed (irritation).    . Cholecalciferol (VITAMIN D) 2000 UNITS CAPS Take 2,000 Units by mouth daily.     [name] diltiazem (CARDIZEM CD) 120 MG 24 hr capsule TAKE 1 CAPSULE BY MOUTH DAILY 30 capsule 6  . diltiazem (CARDIZEM) 60 MG tablet Take 30 mg by mouth 2 (two) times daily as needed  (afib).    [name] glimepiride (AMARYL) 1 MG tablet Take 1 mg by mouth daily before breakfast.     . hydrochlorothiazide (HYDRODIURIL) 25 MG tablet Take 25 mg by mouth daily.    [name] HYDROcodone-acetaminophen (NORCO) 7.5-325 MG tablet Take 1 tablet by mouth every 6 (six) hours as needed for moderate pain.    . isosorbide mononitrate (IMDUR) 30 MG 24 hr tablet TAKE 1 TABLET BY MOUTH DAILY 30 tablet 10  . Liraglutide (VICTOZA) 18 MG/3ML SOPN Inject 1.2 mg into the skin daily.     [name] losartan (COZAAR) 50 MG tablet Take 1 tablet (50 mg total) by mouth daily. 90 tablet 3  . metFORMIN (GLUCOPHAGE) 1000  MG tablet Take 1,000 mg by mouth 2 (two) times daily.     . Multiple Vitamin (MULTIVITAMIN WITH MINERALS) TABS Take 1 tablet by mouth daily.    . nitroGLYCERIN (NITROSTAT) 0.4 MG SL tablet Place 1 tablet (0.4 mg total) under the tongue every 5 (five) minutes x 3 doses as needed for chest pain. 25 tablet 3  . omeprazole (PRILOSEC) 20 MG capsule Take 20 mg by mouth at bedtime.     . potassium chloride (KLOR-CON 10) 10 MEQ tablet Take 10 mEq by mouth daily.     . pravastatin (PRAVACHOL) 80 MG tablet TAKE 1 TABLET BY MOUTH EACH NIGHT AT BEDTIME 30 tablet 10  . sildenafil (REVATIO) 20 MG tablet Take 20-80 mg by mouth daily as needed (erectile dysfunction).    [name] umeclidinium-vilanterol (ANORO ELLIPTA) 62.5-25 MCG/INH AEPB Inhale 1 puff into the lungs daily. 14 each 0  . warfarin (COUMADIN) 5 MG tablet TAKE 1 TO 1 AND 1/2 TABLETS BY MOUTH DAILY AS NEEDED AND AS DIRECTED BY THE COUMADIN CLINIC 45 tablet 2   No current facility-administered medications for this visit.     Allergies:   Adhesive [tape] and Latex    Social History:  The patient  reports that he has been smoking cigarettes.  He has a 12.00 pack-year smoking history. He has never used smokeless tobacco. He reports that he drinks alcohol. He reports that he does not use drugs.   Family History:  The patient's family history is not on file.    ROS:   Please see the history of present illness. All other systems are reviewed and negative.    PHYSICAL EXAM: VS:  BP 132/68 (BP Location: Right Arm, Patient Position: Sitting, Cuff Size: Large)   Pulse 76   Ht 6\' 2"  (1.88 m)   Wt 245 lb (111.1 kg)   BMI 31.46 kg/m  , BMI Body mass index is 31.46 kg/m. GEN: Well nourished, well developed, male in no acute distress  HEENT: normal for age  Neck: no JVD, no carotid bruit, no masses Cardiac: RRR; soft murmur, no rubs, or gallops Respiratory:  clear to auscultation bilaterally, normal work of breathing GI: soft, nontender, nondistended, + BS MS: no deformity or atrophy; no edema; distal pulses are 2+ in all 4 extremities   Skin: warm and dry, no rash Neuro:  Strength and sensation are intact Psych: euthymic mood, full affect   EKG:  EKG is ordered today. The ekg ordered today demonstrates sinus rhythm, heart rate 76, nonspecific T wave abnormality, minor changes from 08/2016 ECG that may be related to lead placement  CATH: 09/2016  Widely patent/normal coronary arteries.  Normal left ventricular systolic function with EF greater than 55%. Elevated end-diastolic pressure, consistent with chronic diastolic dysfunction. RECOMMENDATIONS:  Symptoms are not related to ischemic heart disease.  Consider chronic sleep deprivation, PAD, or other explanations.  ECHO: 08/2016 - Left ventricle: The cavity size was normal. Wall thickness was   increased in a pattern of moderate LVH. Systolic function was   normal. The estimated ejection fraction was in the range of 55%   to 60%. Wall motion was normal; there were no regional wall   motion abnormalities. - Mitral valve: Mildly calcified annulus. - Left atrium: The atrium was mildly dilated. - Right atrium: The atrium was moderately dilated. - Pulmonary arteries: Systolic pressure was moderately increased.   PA peak pressure: 42 mm Hg (S).   Recent Labs: 08/26/2016: ALT 28; Magnesium 1.8;  TSH 2.519  09/14/2016: BUN 9; Creat 0.69; Hemoglobin 13.2; Platelets 136; Potassium 4.2; Sodium 141    Lipid Panel    Component Value Date/Time   CHOL 153 08/27/2016 0235   TRIG 330 (H) 08/27/2016 0235   HDL 29 (L) 08/27/2016 0235   CHOLHDL 5.3 08/27/2016 0235   VLDL 66 (H) 08/27/2016 0235   LDLCALC 58 08/27/2016 0235   LDLDIRECT 99.9 09/08/2011 0843     Wt Readings from Last 3 Encounters:  08/18/17 245 lb (111.1 kg)  05/02/17 239 lb 9.6 oz (108.7 kg)  04/12/17 241 lb 9.6 oz (109.6 kg)     Other studies Reviewed: Additional studies/ records that were reviewed today include: Office notes, hospital records and testing.  ASSESSMENT AND PLAN:  1.  PAF: The palpitations are likely atrial fibrillation.  He is compliant with the bisoprolol 10 mg daily and Cardizem CD 120 mg daily.  He does not generally take the as needed short acting Cardizem. -Increase his baseline Cardizem dose to 180 mg daily -Follow symptoms, let us know if they do not improve  2.  Diabetes: His blood sugars are not well controlled.  We will check an A1c and refer him to diabetes nutrition.  We will leave any med changes to Dr. Kenton Kingfisher.  3.  Dark tarry stools: He had these for about a month and they have improved.  We will check a CBC today and request that he follow-up with Dr. Ardis Hughs.  4.  Chronic anticoagulation: His INR was therapeutic today at 2.5. ho 5.  Dyslipidemia: He has not had any recent lab work, check lipid profile and complete metabolic profile today.  He states he was eating in the car on the way here, some values may be altered but I feel we will get a good baseline.   Current medicines are reviewed at length with the patient today.  The patient does not have concerns regarding medicines.  The following changes have been made: Increase Cardizem  Labs/ tests ordered today include:   Orders Placed This Encounter  Procedures  . Comprehensive Metabolic Panel (CMET)  . CBC  . Lipid panel  .  HgB A1c  . EKG 12-Lead     Disposition:   FU with Dr Percival Spanish  Signed, Rosaria Ferries, PA-C  08/18/2017 10:09 AM    [place] Phone: [phone]; Fax: [phone]  This note was written with the assistance of speech recognition software. Please excuse any transcriptional errors.

## 2017-08-19 LAB — COMPREHENSIVE METABOLIC PANEL
ALBUMIN: 4.4 g/dL (ref 3.5–5.5)
ALK PHOS: 114 IU/L (ref 39–117)
ALT: 29 IU/L (ref 0–44)
AST: 28 IU/L (ref 0–40)
Albumin/Globulin Ratio: 2.1 (ref 1.2–2.2)
BUN / CREAT RATIO: 17 (ref 9–20)
BUN: 11 mg/dL (ref 6–24)
Bilirubin Total: 0.3 mg/dL (ref 0.0–1.2)
CALCIUM: 9.8 mg/dL (ref 8.7–10.2)
CO2: 23 mmol/L (ref 20–29)
CREATININE: 0.66 mg/dL — AB (ref 0.76–1.27)
Chloride: 101 mmol/L (ref 96–106)
GFR calc Af Amer: 122 mL/min/{1.73_m2} (ref >59)
GFR, EST NON AFRICAN AMERICAN: 106 mL/min/{1.73_m2} (ref >59)
GLOBULIN, TOTAL: 2.1 g/dL (ref 1.5–4.5)
GLUCOSE: 207 mg/dL — AB (ref 65–99)
Potassium: 3.8 mmol/L (ref 3.5–5.2)
SODIUM: 140 mmol/L (ref 134–144)
TOTAL PROTEIN: 6.5 g/dL (ref 6.0–8.5)

## 2017-08-19 LAB — CBC
HEMATOCRIT: 38.2 % (ref 37.5–51.0)
Hemoglobin: 13 g/dL (ref 13.0–17.7)
MCH: 30.6 pg (ref 26.6–33.0)
MCHC: 34 g/dL (ref 31.5–35.7)
MCV: 90 fL (ref 79–97)
PLATELETS: 136 10*3/uL — AB (ref 150–379)
RBC: 4.25 x10E6/uL (ref 4.14–5.80)
RDW: 14.9 % (ref 12.3–15.4)
WBC: 5.5 10*3/uL (ref 3.4–10.8)

## 2017-08-19 LAB — LIPID PANEL
CHOL/HDL RATIO: 4.2 ratio (ref 0.0–5.0)
Cholesterol, Total: 122 mg/dL (ref 100–199)
HDL: 29 mg/dL — AB (ref >39)
LDL Calculated: 13 mg/dL (ref 0–99)
Triglycerides: 400 mg/dL — ABNORMAL HIGH (ref 0–149)
VLDL CHOLESTEROL CAL: 80 mg/dL — AB (ref 5–40)

## 2017-08-19 LAB — HEMOGLOBIN A1C
ESTIMATED AVERAGE GLUCOSE: 186 mg/dL
HEMOGLOBIN A1C: 8.1 % — AB (ref 4.8–5.6)

## 2017-08-30 ENCOUNTER — Other Ambulatory Visit: Payer: Self-pay | Admitting: Cardiology

## 2017-09-14 ENCOUNTER — Ambulatory Visit (INDEPENDENT_AMBULATORY_CARE_PROVIDER_SITE_OTHER): Payer: BLUE CROSS/BLUE SHIELD | Admitting: *Deleted

## 2017-09-14 DIAGNOSIS — Z5181 Encounter for therapeutic drug level monitoring: Secondary | ICD-10-CM | POA: Diagnosis not present

## 2017-09-14 DIAGNOSIS — I480 Paroxysmal atrial fibrillation: Secondary | ICD-10-CM | POA: Diagnosis not present

## 2017-09-14 LAB — POCT INR: INR: 2.2

## 2017-09-14 NOTE — Patient Instructions (Signed)
Description   Continue on same dosage 1 pill of Coumadin everyday except 1.5 pills on Mondays, Wednesdays, and Fridays.  Recheck in 4 weeks.  Call with any new medications or scheduled for any procedures #[phone]      

## 2017-10-02 ENCOUNTER — Telehealth: Payer: Self-pay | Admitting: Cardiology

## 2017-10-02 NOTE — Telephone Encounter (Signed)
Returned call to patient.He stated he feels bad.Stated ever since Diltiazem increased his palpitations seem worse.Stated he tires easy.Stated some days he forgets to take medications and on those days he feels better.He already scheduled appointment with Kerin Ransom PA 5/21.Advised to keep appointment as planned.

## 2017-10-02 NOTE — Telephone Encounter (Signed)
NEW MESSAGE    Pt c/o Shortness Of Breath: STAT if SOB developed within the last 24 hours or pt is noticeably SOB on the phone  1. Are you currently SOB (can you hear that pt is SOB on the phone)? No  2. How long have you been experiencing SOB? 1 month  3. Are you SOB when sitting or when up moving around?  When moving around  4. Are you currently experiencing any other symptoms? Patient states he has episodes of afib

## 2017-10-03 ENCOUNTER — Encounter: Payer: Self-pay | Admitting: Cardiology

## 2017-10-03 ENCOUNTER — Ambulatory Visit (INDEPENDENT_AMBULATORY_CARE_PROVIDER_SITE_OTHER): Payer: BLUE CROSS/BLUE SHIELD | Admitting: Cardiology

## 2017-10-03 VITALS — BP 122/68 | HR 74 | Ht 74.0 in | Wt 248.2 lb

## 2017-10-03 DIAGNOSIS — IMO0001 Reserved for inherently not codable concepts without codable children: Secondary | ICD-10-CM

## 2017-10-03 DIAGNOSIS — R002 Palpitations: Secondary | ICD-10-CM | POA: Diagnosis not present

## 2017-10-03 DIAGNOSIS — Z0389 Encounter for observation for other suspected diseases and conditions ruled out: Secondary | ICD-10-CM | POA: Diagnosis not present

## 2017-10-03 DIAGNOSIS — I10 Essential (primary) hypertension: Secondary | ICD-10-CM | POA: Diagnosis not present

## 2017-10-03 DIAGNOSIS — J449 Chronic obstructive pulmonary disease, unspecified: Secondary | ICD-10-CM

## 2017-10-03 DIAGNOSIS — I480 Paroxysmal atrial fibrillation: Secondary | ICD-10-CM | POA: Diagnosis not present

## 2017-10-03 DIAGNOSIS — Z7901 Long term (current) use of anticoagulants: Secondary | ICD-10-CM | POA: Diagnosis not present

## 2017-10-03 DIAGNOSIS — E119 Type 2 diabetes mellitus without complications: Secondary | ICD-10-CM

## 2017-10-03 MED ORDER — DILTIAZEM HCL ER COATED BEADS 120 MG PO CP24: 120.0000 mg | ORAL_CAPSULE | Freq: Every day | ORAL | 4 refills | Status: DC

## 2017-10-03 NOTE — Patient Instructions (Addendum)
Medication Instructions:  Decrease: Diltiazem 120 mg    Follow-Up: Your physician recommends that you schedule a follow-up appointment in: 3 month with Kerin Ransom, PA   Any Other Special Instructions Will Be Listed Below (If Applicable). Contact Dr. Rex Kras about leg pain and Neurontin  Contact PCP about Inhaler.        If you need a refill on your cardiac medications before your next appointment, please call your pharmacy.

## 2017-10-03 NOTE — Assessment & Plan Note (Signed)
NIDDM with LE neuropathy ( 3+/4 intact LEA pulses on exam)

## 2017-10-03 NOTE — Assessment & Plan Note (Signed)
Coumadin for PAF

## 2017-10-03 NOTE — Assessment & Plan Note (Signed)
Cath 09/19/16 for chest pain

## 2017-10-03 NOTE — Assessment & Plan Note (Signed)
Significant wheezing on exam and non compliance with medical Rx secondary to ability to afford medication- I asked him to contact Dr Bari Mantis office for possible alternatives.

## 2017-10-03 NOTE — Assessment & Plan Note (Signed)
Worse on increased diltiazem-? Not sure what he is having but will decrease his Diltiazem back to 120 mg daily as he states he felt better when he "fortgot" to take the diltiazem 180 mg.

## 2017-10-03 NOTE — Assessment & Plan Note (Signed)
Holding NSR by recent EKGs

## 2017-10-03 NOTE — Progress Notes (Signed)
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 He has a prior history of atrial fibrillation and atrial flutter. He is s/p flutter ablation in 2014 and has actually done well from this standpoint. He had a cath in May 2018 that showed widely patent coronaries and normal LVF with moderate LVH.  He does have significant COPD, sleep apnea (intolerant to C-pap), HTN, and DM with what sounds like LE neuropathy.   He saw Rosaria Ferries PA in April and complained of palpitations, he was in NSR at the time of his visit. She increased his Diltiazem to 180 mg daily. The patient called yesterday with complaints of palpitations that he felt were worse on the increased Diltiazem. In the office he is in NSR-74. He has significant wheezing on exam. He admits he doesn't use his Anoro inhaler because his insurance would not cover it.    Current Outpatient Medications  Medication Sig Dispense Refill  . albuterol (PROAIR HFA) 108 (90 Base) MCG/ACT inhaler Inhale 1-2 puffs into the lungs every 6 (six) hours as needed for wheezing or shortness of breath. 1 Inhaler 5  . bisoprolol (ZEBETA) 10 MG tablet Take 10 mg by mouth daily.    . chlorthalidone (HYGROTON) 25 MG tablet Take 25 mg by mouth daily.    . Cholecalciferol (VITAMIN D) 2000 UNITS CAPS Take 2,000 Units by mouth daily.     [name] diltiazem (CARDIZEM CD) 120 MG 24 hr capsule Take 1 capsule (120 mg total) by mouth daily. 30 capsule 4  . diltiazem (CARDIZEM) 60 MG tablet Take 30 mg by mouth 2 (two) times daily as needed (afib).    [name] glimepiride (AMARYL) 1 MG tablet Take 1 mg by mouth daily before breakfast.     . hydrochlorothiazide (HYDRODIURIL) 25 MG tablet Take 25 mg by mouth daily.    [name] HYDROcodone-acetaminophen (NORCO) 7.5-325 MG tablet Take 1 tablet by mouth every 6 (six) hours as needed for moderate pain.    .  isosorbide mononitrate (IMDUR) 30 MG 24 hr tablet TAKE 1 TABLET BY MOUTH DAILY 30 tablet 10  . Liraglutide (VICTOZA) 18 MG/3ML SOPN Inject 1.2 mg into the skin daily.     [name] losartan (COZAAR) 50 MG tablet Take 1 tablet (50 mg total) by mouth daily. 90 tablet 3  . metFORMIN (GLUCOPHAGE) 1000 MG tablet Take 1,000 mg by mouth 2 (two) times daily.     . Multiple Vitamin (MULTIVITAMIN WITH MINERALS) TABS Take 1 tablet by mouth daily.    . nitroGLYCERIN (NITROSTAT) 0.4 MG SL tablet Place 1 tablet (0.4 mg total) under the tongue every 5 (five) minutes x 3 doses as needed for chest pain. 25 tablet 3  . omeprazole (PRILOSEC) 20 MG capsule Take 20 mg by mouth at bedtime.     . potassium chloride (KLOR-CON 10) 10 MEQ tablet Take 10 mEq by mouth daily.     . pravastatin (PRAVACHOL) 80 MG tablet TAKE 1 TABLET BY MOUTH EACH NIGHT AT BEDTIME 30 tablet 10  . sildenafil (REVATIO) 20 MG tablet Take 20-80 mg by mouth daily as needed (erectile dysfunction).    . warfarin (COUMADIN) 5 MG tablet TAKE 1 TO 1 AND 1/2 TABLETS BY MOUTH DAILY AS NEEDED AND AS DIRECTED BY THE COUMADIN CLINIC 45 tablet 4   No current facility-administered medications for this visit.  Allergies  Allergen Reactions  . Adhesive [Tape]     itching  . Latex Itching    When tape is on the skin too long skin gets red & itching    Past Medical History:  Diagnosis Date  . Arthritis    lower back  . Asthma   . Atrial flutter ([place])    s/p CTI ablation by Dr Rayann Heman  . Brain aneurysm 2009   questionable. A follow up CTA in 2009 showed no evidence of  . Chronic back pain    DDD/stenosis  . Colon polyps    9 polyps removed 10/13/11  . Complication of anesthesia September 11, 2012   slow to awaken after ablation  . Diabetes mellitus    takes Metformin and Glimepiride daily  . Emphysema   . GERD (gastroesophageal reflux disease)    takes Omeprazole bid  . History of shingles   . HLD (hyperlipidemia)    takes Pravastatin daily  . HTN  (hypertension)    takes Prinizide daily  . Obesity   . OSA (obstructive sleep apnea)    not always using cpap  . Overdose 2009   unintentional Flecanide overdose  . Peripheral neuropathy   . Short-term memory loss   . Tobacco abuse     Social History   Socioeconomic History  . Marital status: Married    Spouse name: Not on file  . Number of children: 2  . Years of education: Not on file  . Highest education level: Not on file  Occupational History  . Occupation: Paediatric nurse  . Financial resource strain: Not on file  . Food insecurity:    Worry: Not on file    Inability: Not on file  . Transportation needs:    Medical: Not on file    Non-medical: Not on file  Tobacco Use  . Smoking status: Current Every Day Smoker    Packs/day: 0.30    Years: 40.00    Pack years: 12.00    Types: Cigarettes  . Smokeless tobacco: Never Used  Substance and Sexual Activity  . Alcohol use: Yes    Alcohol/week: 0.0 oz    Comment: couple of times a month liquor, he tells me he typically drinks 2-4 drinks most days but hasnt drank anything in 6 weeks  . Drug use: No  . Sexual activity: Yes  Lifestyle  . Physical activity:    Days per week: Not on file    Minutes per session: Not on file  . Stress: Not on file  Relationships  . Social connections:    Talks on phone: Not on file    Gets together: Not on file    Attends religious service: Not on file    Active member of club or organization: Not on file    Attends meetings of clubs or organizations: Not on file    Relationship status: Not on file  . Intimate partner violence:    Fear of current or ex partner: Not on file    Emotionally abused: Not on file    Physically abused: Not on file    Forced sexual activity: Not on file  Other Topics Concern  . Not on file  Social History Narrative   Daily caffeine(Mountain Dew)       Lives in [place] with spouse.   Unemployed due to chronic back/ leg pain             Family History  Problem Relation  Age of Onset  . Colon cancer Neg Hx   . Anesthesia problems Neg Hx   . Hypotension Neg Hx   . Malignant hyperthermia Neg Hx   . Pseudochol deficiency Neg Hx      Review of Systems: General: negative for chills, fever, night sweats or weight changes.  Cardiovascular: negative for chest pain,edema, orthopnea, paroxysmal nocturnal dyspnea  Dermatological: negative for rash Respiratory: negative for cough  Urologic: negative for hematuria Abdominal: negative for nausea, vomiting, diarrhea, bright red blood per rectum, melena, or hematemesis Neurologic: negative for visual changes, syncope, or dizziness All other systems reviewed and are otherwise negative except as noted above.    Blood pressure 122/68, pulse 74, height 6\' 2"  (1.88 m), weight 248 lb 3.2 oz (112.6 kg).  General appearance: alert, cooperative and no distress Neck: no carotid bruit and no JVD Lungs: diffuse expiratory wheezing Heart: regular rate and rhythm Extremities: no edema Skin: Skin color, texture, turgor normal. No rashes or lesions Neurologic: Grossly normal  EKG NSR  ASSESSMENT AND PLAN:   Palpitations Worse on increased diltiazem-? Not sure what he is having but will decrease his Diltiazem back to 120 mg daily as he states he felt better when he "fortgot" to take the diltiazem 180 mg.  COPD (chronic obstructive pulmonary disease) (HCC) Significant wheezing on exam and non compliance with medical Rx secondary to ability to afford medication- I asked him to contact Dr Bari Mantis office for possible alternatives.   Essential hypertension Controlled.   Non-insulin dependent type 2 diabetes mellitus (HCC) NIDDM with LE neuropathy ( 3+/4 intact LEA pulses on exam)  PAF (paroxysmal atrial fibrillation) (HCC) Holding NSR by recent EKGs  Long term current use of anticoagulant therapy Coumadin for PAF  Normal coronary arteries Cath 09/19/16 for chest pain   PLAN  I  told him he could decrease his Diltiazem back to 120 mg daily if he thinks he felt better on that dose. I did tell him I was more concerned about his COPD and suggested he contact Dr Elsworth Soho about his COPD treatment. He has chronic leg pain and I reassured him he has good circulation on exam and suggested he contact Dr Rex Kras about his leg pain (? trial of gabapentin). If he continues to complain of palpitations we'll need to get an event monitor on him. I'll see him back in Aug.   Kerin Ransom PA-C 10/03/2017 2:00 PM

## 2017-10-03 NOTE — Assessment & Plan Note (Signed)
Controlled.  

## 2017-10-06 ENCOUNTER — Ambulatory Visit (INDEPENDENT_AMBULATORY_CARE_PROVIDER_SITE_OTHER): Payer: BLUE CROSS/BLUE SHIELD | Admitting: Adult Health

## 2017-10-06 ENCOUNTER — Encounter: Payer: Self-pay | Admitting: Adult Health

## 2017-10-06 DIAGNOSIS — J441 Chronic obstructive pulmonary disease with (acute) exacerbation: Secondary | ICD-10-CM

## 2017-10-06 DIAGNOSIS — Z720 Tobacco use: Secondary | ICD-10-CM | POA: Diagnosis not present

## 2017-10-06 MED ORDER — UMECLIDINIUM-VILANTEROL 62.5-25 MCG/INH IN AEPB: 1.0000 | INHALATION_SPRAY | Freq: Every day | RESPIRATORY_TRACT | 0 refills | Status: DC

## 2017-10-06 MED ORDER — AMOXICILLIN-POT CLAVULANATE 875-125 MG PO TABS: 1.0000 | ORAL_TABLET | Freq: Two times a day (BID) | ORAL | 0 refills | Status: AC

## 2017-10-06 NOTE — Patient Instructions (Addendum)
Augmentin 875mg  Twice daily  For 7 days , take with food.  Mucinex DM Twice daily  As needed  Cough/congestion  Saline nasal rinses As needed   Working on not smoking .  Restart ANORO 1 puff daily.  Follow up with Dr. Elsworth Soho  In 2-3 months and As needed   Please contact office for sooner follow up if symptoms do not improve or worsen or seek emergency care

## 2017-10-06 NOTE — Assessment & Plan Note (Signed)
Smoking cessation  

## 2017-10-06 NOTE — Progress Notes (Signed)
@Patient  ID: [name], male    DOB: [birth year], 62 y.o.   MRN: [id number]  Chief Complaint  Patient presents with  . Acute Visit    COPD     Referring provider: [name], MD  HPI: 62 year old male active smoker followed for COPD and obstructive sleep apnea Patient has chronic A. fib status post ablation in 2016 Has OSA -CPAP intolerant    Significant tests/ events  Spirometry 01/2010 showed ratio of 68, FEV 1 was 72%, smaller airways 55%, no significant BD response   PSG from [facility] - severe with AHI 31.h corrected by CPAP 5 cm (wt was 250 lbs)  11/2011 Underwent neck sx >>given pre-op prednisone for asthmatic bronchitis -uneventful post op   09/2012 FEV1 67%, no BD response  Spirometry 2015 with FEV1 59%, ratio 61  10/06/2017 Acute OV : COPD  Patient presents for an acute office visit.  Patient complains of increased cough, congestion with thick yellow to green mucus sinus congestion and drainage.  Patient had increased shortness of breath.  He denies any hemoptysis, chest pain, orthopnea or increased edema.  Appetite is been good with no nausea vomiting diarrhea.  Patient says family members have had similar symptoms.  Patient has been unable to afford inhalers in the past and is currently only using Ventolin. He continues to smoke.  Smoking cessation was discussed    Allergies  Allergen Reactions  . Adhesive [Tape]     itching  . Latex Itching    When tape is on the skin too long skin gets red & itching    Immunization History  Administered Date(s) Administered  . Influenza Split 01/30/2011, 03/05/2012, 02/13/2013, 02/03/2016  . Influenza Whole 02/13/2009  . Influenza,inj,Quad PF,6+ Mos 03/14/2014, 04/22/2015, 01/19/2016, 02/02/2017  . Pneumococcal Polysaccharide-23 02/14/2007    Past Medical History:  Diagnosis Date  . Arthritis    lower back  . Asthma   . Atrial flutter ([place])    s/p CTI ablation by Dr Rayann Heman  . Brain aneurysm 2009   questionable. A follow up CTA in 2009 showed no evidence of  . Chronic back pain    DDD/stenosis  . Colon polyps    9 polyps removed 10/13/11  . Complication of anesthesia September 11, 2012   slow to awaken after ablation  . Diabetes mellitus    takes Metformin and Glimepiride daily  . Emphysema   . GERD (gastroesophageal reflux disease)    takes Omeprazole bid  . History of shingles   . HLD (hyperlipidemia)    takes Pravastatin daily  . HTN (hypertension)    takes Prinizide daily  . Obesity   . OSA (obstructive sleep apnea)    not always using cpap  . Overdose 2009   unintentional Flecanide overdose  . Peripheral neuropathy   . Short-term memory loss   . Tobacco abuse     Tobacco History: Social History   Tobacco Use  Smoking Status Current Every Day Smoker  . Packs/day: 0.30  . Years: 40.00  . Pack years: 12.00  . Types: Cigarettes  Smokeless Tobacco Never Used   Ready to quit: No Counseling given: Yes   Outpatient Encounter Medications as of 10/06/2017  Medication Sig  . albuterol (PROAIR HFA) 108 (90 Base) MCG/ACT inhaler Inhale 1-2 puffs into the lungs every 6 (six) hours as needed for wheezing or shortness of breath.  . bisoprolol (ZEBETA) 10 MG tablet Take 10 mg by mouth daily.  . chlorthalidone (HYGROTON) 25 MG tablet  Take 25 mg by mouth daily.  . Cholecalciferol (VITAMIN D) 2000 UNITS CAPS Take 2,000 Units by mouth daily.   [name] diltiazem (CARDIZEM CD) 120 MG 24 hr capsule Take 1 capsule (120 mg total) by mouth daily.  [name] diltiazem (CARDIZEM) 60 MG tablet Take 30 mg by mouth 2 (two) times daily as needed (afib).  . gabapentin (NEURONTIN) 100 MG capsule Take 100 mg by mouth 2 (two) times daily.  [name] glimepiride (AMARYL) 1 MG tablet Take 1 mg by mouth daily before breakfast.   . hydrochlorothiazide (HYDRODIURIL) 25 MG tablet Take 25 mg by mouth daily.  [name] HYDROcodone-acetaminophen (NORCO) 7.5-325 MG tablet Take 1 tablet by mouth every 6 (six) hours as needed for  moderate pain.  . isosorbide mononitrate (IMDUR) 30 MG 24 hr tablet TAKE 1 TABLET BY MOUTH DAILY  . Liraglutide (VICTOZA) 18 MG/3ML SOPN Inject 1.2 mg into the skin daily.   . metFORMIN (GLUCOPHAGE) 1000 MG tablet Take 1,000 mg by mouth 2 (two) times daily.   . Multiple Vitamin (MULTIVITAMIN WITH MINERALS) TABS Take 1 tablet by mouth daily.  . nitroGLYCERIN (NITROSTAT) 0.4 MG SL tablet Place 1 tablet (0.4 mg total) under the tongue every 5 (five) minutes x 3 doses as needed for chest pain.  [name] omeprazole (PRILOSEC) 20 MG capsule Take 20 mg by mouth at bedtime.   . potassium chloride (KLOR-CON 10) 10 MEQ tablet Take 10 mEq by mouth daily.   . pravastatin (PRAVACHOL) 80 MG tablet TAKE 1 TABLET BY MOUTH EACH NIGHT AT BEDTIME  . sildenafil (REVATIO) 20 MG tablet Take 20-80 mg by mouth daily as needed (erectile dysfunction).  . warfarin (COUMADIN) 5 MG tablet TAKE 1 TO 1 AND 1/2 TABLETS BY MOUTH DAILY AS NEEDED AND AS DIRECTED BY THE COUMADIN CLINIC  . amoxicillin-clavulanate (AUGMENTIN) 875-125 MG tablet Take 1 tablet by mouth 2 (two) times daily for 7 days.  [name] losartan (COZAAR) 50 MG tablet Take 1 tablet (50 mg total) by mouth daily.  [name] umeclidinium-vilanterol (ANORO ELLIPTA) 62.5-25 MCG/INH AEPB Inhale 1 puff into the lungs daily.   No facility-administered encounter medications on file as of 10/06/2017.      Review of Systems  Constitutional:   No  weight loss, night sweats,  Fevers, chills, fatigue, or  lassitude.  HEENT:   No headaches,  Difficulty swallowing,  Tooth/dental problems, or  Sore throat,                No sneezing, itching, ear ache,  +nasal congestion, post nasal drip,   CV:  No chest pain,  Orthopnea, PND, swelling in lower extremities, anasarca, dizziness, palpitations, syncope.   GI  No heartburn, indigestion, abdominal pain, nausea, vomiting, diarrhea, change in bowel habits, loss of appetite, bloody stools.   Resp:    No chest wall deformity  Skin: no rash or  lesions.  GU: no dysuria, change in color of urine, no urgency or frequency.  No flank pain, no hematuria   MS:  No joint pain or swelling.  No decreased range of motion.  No back pain.    Physical Exam  BP 132/62 (BP Location: Left Arm, Cuff Size: Normal)   Pulse 83   Temp 99.1 F (37.3 C) (Oral)   Ht 6\' 3"  (1.905 m)   Wt 247 lb (112 kg)   SpO2 96%   BMI 30.87 kg/m   GEN: A/Ox3; pleasant , NAD, elderly    HEENT:  [place]/AT,  EACs-clear, TMs-wnl, NOSE-clear, THROAT-clear, no lesions,  no postnasal drip or exudate noted.   NECK:  Supple w/ fair ROM; no JVD; normal carotid impulses w/o bruits; no thyromegaly or nodules palpated; no lymphadenopathy.    RESP few trace rhonchi  no accessory muscle use, no dullness to percussion  CARD:  RRR, no m/r/g, tr peripheral edema, pulses intact, no cyanosis or clubbing.  GI:   Soft & nt; nml bowel sounds; no organomegaly or masses detected.   Musco: Warm bil, no deformities or joint swelling noted.   Neuro: alert, no focal deficits noted.    Skin: Warm, no lesions or rashes    Lab Results:  CBC   BNP ProBNP  Imaging: No results found.   Assessment & Plan:   COPD (chronic obstructive pulmonary disease) ([place]) Flare  Pt assitance papers for [place]  Patient Instructions  Augmentin 875mg  Twice daily  For 7 days , take with food.  Mucinex DM Twice daily  As needed  Cough/congestion  Saline nasal rinses As needed   Working on not smoking .  Restart ANORO 1 puff daily.  Follow up with Dr. Elsworth Soho  In 2-3 months and As needed   Please contact office for sooner follow up if symptoms do not improve or worsen or seek emergency care       Tobacco abuse Smoking cessation      Rexene Edison, NP 10/06/2017

## 2017-10-06 NOTE — Assessment & Plan Note (Signed)
Flare  Pt assitance papers for [name]  Patient Instructions  Augmentin 875mg  Twice daily  For 7 days , take with food.  Mucinex DM Twice daily  As needed  Cough/congestion  Saline nasal rinses As needed   Working on not smoking .  Restart ANORO 1 puff daily.  Follow up with Dr. Elsworth Soho  In 2-3 months and As needed   Please contact office for sooner follow up if symptoms do not improve or worsen or seek emergency care

## 2017-10-11 NOTE — Progress Notes (Signed)
Reviewed & agree with plan  

## 2017-10-12 ENCOUNTER — Ambulatory Visit (INDEPENDENT_AMBULATORY_CARE_PROVIDER_SITE_OTHER): Payer: BLUE CROSS/BLUE SHIELD | Admitting: *Deleted

## 2017-10-12 DIAGNOSIS — Z5181 Encounter for therapeutic drug level monitoring: Secondary | ICD-10-CM | POA: Diagnosis not present

## 2017-10-12 DIAGNOSIS — I480 Paroxysmal atrial fibrillation: Secondary | ICD-10-CM

## 2017-10-12 LAB — POCT INR: INR: 1.9 — AB (ref 2.0–3.0)

## 2017-10-12 NOTE — Patient Instructions (Signed)
Description   Today take 1.5 pills then continue on same dosage 1 pill of Coumadin everyday except 1.5 pills on Mondays, Wednesdays, and Fridays.  Recheck in 4 weeks.  Call with any new medications or scheduled for any procedures [phone]

## 2017-10-16 ENCOUNTER — Telehealth: Payer: Self-pay | Admitting: Pulmonary Disease

## 2017-10-16 MED ORDER — UMECLIDINIUM-VILANTEROL 62.5-25 MCG/INH IN AEPB: 1.0000 | INHALATION_SPRAY | Freq: Every day | RESPIRATORY_TRACT | 3 refills | Status: DC

## 2017-10-16 NOTE — Telephone Encounter (Signed)
Called and spoke with patient, he states that now that he has patient assistance for the Anoro he can have his prescription sent to the pharmacy. This has been sent. Nothing further needed.

## 2017-10-20 ENCOUNTER — Other Ambulatory Visit: Payer: Self-pay | Admitting: Cardiology

## 2017-11-01 ENCOUNTER — Ambulatory Visit: Payer: BLUE CROSS/BLUE SHIELD | Admitting: Adult Health

## 2017-11-09 ENCOUNTER — Ambulatory Visit (INDEPENDENT_AMBULATORY_CARE_PROVIDER_SITE_OTHER): Payer: BLUE CROSS/BLUE SHIELD | Admitting: *Deleted

## 2017-11-09 DIAGNOSIS — Z5181 Encounter for therapeutic drug level monitoring: Secondary | ICD-10-CM

## 2017-11-09 DIAGNOSIS — I480 Paroxysmal atrial fibrillation: Secondary | ICD-10-CM

## 2017-11-09 LAB — POCT INR: INR: 2.4 (ref 2.0–3.0)

## 2017-11-09 NOTE — Patient Instructions (Signed)
Description   Continue on same dosage 1 pill of Coumadin everyday except 1.5 pills on Mondays, Wednesdays, and Fridays.  Recheck in 4 weeks.  Call with any new medications or scheduled for any procedures [phone]

## 2017-11-13 ENCOUNTER — Telehealth: Payer: Self-pay | Admitting: Gastroenterology

## 2017-11-13 NOTE — Telephone Encounter (Signed)
The pt has been advised to keep the appt that has been scheduled with Alonza Bogus for recommendations.  He has not been seen here since 2014.

## 2017-12-05 ENCOUNTER — Encounter: Payer: Self-pay | Admitting: Gastroenterology

## 2017-12-05 ENCOUNTER — Ambulatory Visit (INDEPENDENT_AMBULATORY_CARE_PROVIDER_SITE_OTHER): Payer: BLUE CROSS/BLUE SHIELD | Admitting: Gastroenterology

## 2017-12-05 ENCOUNTER — Telehealth: Payer: Self-pay | Admitting: Emergency Medicine

## 2017-12-05 ENCOUNTER — Other Ambulatory Visit (INDEPENDENT_AMBULATORY_CARE_PROVIDER_SITE_OTHER): Payer: BLUE CROSS/BLUE SHIELD

## 2017-12-05 VITALS — BP 130/64 | HR 80 | Ht 73.0 in | Wt 247.1 lb

## 2017-12-05 DIAGNOSIS — K625 Hemorrhage of anus and rectum: Secondary | ICD-10-CM

## 2017-12-05 DIAGNOSIS — Z86010 Personal history of colonic polyps: Secondary | ICD-10-CM

## 2017-12-05 DIAGNOSIS — R194 Change in bowel habit: Secondary | ICD-10-CM

## 2017-12-05 DIAGNOSIS — Z7901 Long term (current) use of anticoagulants: Secondary | ICD-10-CM | POA: Diagnosis not present

## 2017-12-05 DIAGNOSIS — R1032 Left lower quadrant pain: Secondary | ICD-10-CM

## 2017-12-05 LAB — CBC WITH DIFFERENTIAL/PLATELET
BASOS ABS: 0 10*3/uL (ref 0.0–0.1)
BASOS PCT: 0.3 % (ref 0.0–3.0)
EOS ABS: 0.2 10*3/uL (ref 0.0–0.7)
Eosinophils Relative: 2.7 % (ref 0.0–5.0)
HCT: 38.9 % — ABNORMAL LOW (ref 39.0–52.0)
HEMOGLOBIN: 13.4 g/dL (ref 13.0–17.0)
LYMPHS PCT: 19.7 % (ref 12.0–46.0)
Lymphs Abs: 1.3 10*3/uL (ref 0.7–4.0)
MCHC: 34.4 g/dL (ref 30.0–36.0)
MCV: 89.7 fl (ref 78.0–100.0)
MONO ABS: 0.5 10*3/uL (ref 0.1–1.0)
Monocytes Relative: 7.7 % (ref 3.0–12.0)
NEUTROS ABS: 4.7 10*3/uL (ref 1.4–7.7)
Neutrophils Relative %: 69.6 % (ref 43.0–77.0)
PLATELETS: 127 10*3/uL — AB (ref 150.0–400.0)
RBC: 4.34 Mil/uL (ref 4.22–5.81)
RDW: 15.7 % — AB (ref 11.5–15.5)
WBC: 6.8 10*3/uL (ref 4.0–10.5)

## 2017-12-05 LAB — BASIC METABOLIC PANEL
BUN: 11 mg/dL (ref 6–23)
CALCIUM: 9.6 mg/dL (ref 8.4–10.5)
CHLORIDE: 102 meq/L (ref 96–112)
CO2: 29 mEq/L (ref 19–32)
CREATININE: 0.85 mg/dL (ref 0.40–1.50)
GFR: 97.7 mL/min (ref >60.00)
Glucose, Bld: 247 mg/dL — ABNORMAL HIGH (ref 70–99)
Potassium: 3.8 mEq/L (ref 3.5–5.1)
Sodium: 141 mEq/L (ref 135–145)

## 2017-12-05 MED ORDER — DICYCLOMINE HCL 20 MG PO TABS: 20.0000 mg | ORAL_TABLET | Freq: Two times a day (BID) | ORAL | 2 refills | Status: DC

## 2017-12-05 NOTE — Telephone Encounter (Signed)
[facility] Pre-operative Risk Assessment     Request for surgical clearance:     Endoscopy Procedure  What type of surgery is being performed?     colonoscopy  When is this surgery scheduled?     TBD  What type of clearance is required ?   Pharmacy  Are there any medications that need to be held prior to surgery and how long? Coumadin 5 days  Practice name and name of physician performing surgery?      [facility]  What is your office phone and fax number?      Phone- [phone]  [phone]  Anesthesia type (None, local, MAC, general) ?       MAC

## 2017-12-05 NOTE — Patient Instructions (Signed)
It has been recommended to you by your physician that you have a colonoscopy completed. Per your request, we did not schedule the procedure(s) today. Please contact our office at [phone] should you decide to have the procedure completed.  Your provider has requested that you go to the basement level for lab work before leaving today. Press "B" on the elevator. The lab is located at the first door on the left as you exit the elevator.  We have sent the following medications to your pharmacy for you to pick up at your convenience: Bentyl 20 mg twice a day    You have been scheduled for a CT scan of the abdomen and pelvis at [facility] ([address]---this is in the same building as [facility]).   You are scheduled on 12-08-17 at 2:30 pm. You should arrive 15 minutes prior to your appointment time for registration. Please follow the written instructions below on the day of your exam:  WARNING: IF YOU ARE ALLERGIC TO IODINE/X-RAY DYE, PLEASE NOTIFY RADIOLOGY IMMEDIATELY AT [phone]! YOU WILL BE GIVEN A 13 HOUR PREMEDICATION PREP.  1) Do not eat or drink anything after 10:30 am (4 hours prior to your test) 2) You have been given 2 bottles of oral contrast to drink. The solution may taste better if refrigerated, but do NOT add ice or any other liquid to this solution. Shake well before drinking.    Drink 1 bottle of contrast @ 11:30 am (2 hours prior to your exam)  Drink 1 bottle of contrast @ 12:30 pm (1 hour prior to your exam)  You may take any medications as prescribed with a small amount of water except for the following: Metformin, Glucophage, Glucovance, Avandamet, Riomet, Fortamet, Actoplus Met, Janumet, Glumetza or Metaglip. The above medications must be held the day of the exam AND 48 hours after the exam.  The purpose of you drinking the oral contrast is to aid in the visualization of your intestinal tract. The contrast solution may cause some diarrhea.  Before your exam is started, you will be given a small amount of fluid to drink. Depending on your individual set of symptoms, you may also receive an intravenous injection of x-ray contrast/dye. Plan on being at [facility] for 30 minutes or longer, depending on the type of exam you are having performed.  This test typically takes 30-45 minutes to complete.  If you have any questions regarding your exam or if you need to reschedule, you may call the CT department at [phone] between the hours of 8:00 am and 5:00 pm, Monday-Friday.  ________________________________________________________________________

## 2017-12-05 NOTE — Progress Notes (Signed)
12/05/2017 [name] [id number] 08/19/1955   HISTORY OF PRESENT ILLNESS: This is a 62 year old male who is a patient of Dr. Ardis Hughs and is known to him just for colonoscopies.  He had a colonoscopy in 2013 where he had 9 polyps removed, one was a large polyp and these were all tubular adenomas and tubulovillous adenomas.  He had repeat colonoscopy in 1 year with 2 polyps removed.  Dr. Ardis Hughs had recommended another colonoscopy in 3 years, but patient has not yet returned for that study.  He would like to plan to get that scheduled.  This time, however, he also reports some issues with his stomach that he has been experiencing.  He tells me that for the past 4 months or so he is been having a lot of pain in his gut.  He says that he used to move his bowels regularly and now he will skip days and then his bowels will be all over the place, sometimes hard with straining and then produces some bright red blood on the toilet paper that he contributes to his hemorrhoids.  Then other days he will have loose stools with urgency.  Pain is mostly in his mid gut and a lot of times more localized to the left side.  He denies any fevers or chills.  He denies any nausea or vomiting.  On rare occasion he will have a dark stool, but contributes this to something that he did has eaten.   Past Medical History:  Diagnosis Date  . Arthritis    lower back  . Asthma   . Atrial flutter ([place])    s/p CTI ablation by Dr Rayann Heman  . Brain aneurysm 2009   questionable. A follow up CTA in 2009 showed no evidence of  . Chronic back pain    DDD/stenosis  . Colon polyps    9 polyps removed 10/13/11  . Complication of anesthesia September 11, 2012   slow to awaken after ablation  . COPD (chronic obstructive pulmonary disease) ([place])   . Diabetes mellitus    takes Metformin and Glimepiride daily  . Emphysema   . GERD (gastroesophageal reflux disease)    takes Omeprazole bid  . History of shingles   . HLD  (hyperlipidemia)    takes Pravastatin daily  . HTN (hypertension)    takes Prinizide daily  . Obesity   . OSA (obstructive sleep apnea)    not always using cpap  . Overdose 2009   unintentional Flecanide overdose  . Peripheral neuropathy   . Short-term memory loss   . Tobacco abuse    Past Surgical History:  Procedure Laterality Date  . ANTERIOR CERVICAL DECOMP/DISCECTOMY FUSION  01/04/2012   Procedure: ANTERIOR CERVICAL DECOMPRESSION/DISCECTOMY FUSION 1 LEVEL/HARDWARE REMOVAL;  Surgeon: Ophelia Charter, MD;  Location: [facility]UR[facility]S;  Service: Neurosurgery;  Laterality: N/A;  explore cervical fusion Cervical six - seven  with removal of codman plate anterior cervical decompression with fusion interbody prothesis plating and bonegraft  . APPENDECTOMY  2-95yrs ago  . ATRIAL FIBRILLATION ABLATION N/A 09/11/2012   PT DID NOT HAVE AN ATRIAL FIBRILLATION ABLATION IN 2014!  ATRIAL FLUTTER ABLATION ONLY  . ATRIAL FLUTTER ABLATION  09/11/2012   CTI ablation by Dr Rayann Heman  . CARDIAC CATHETERIZATION  2008   no significant CAD  . CARDIOVERSION  05/05/2011   Procedure: CARDIOVERSION;  Surgeon: Lelon Perla, MD;  Location: [facility] ENDOSCOPY;  Service: Cardiovascular;  Laterality: N/A;  . CARDIOVERSION Bilateral  07/26/2012   Procedure: CARDIOVERSION;  Surgeon: Minus Breeding, MD;  Location: [facility];  Service: Cardiovascular;  Laterality: Bilateral;  . CARPAL TUNNEL RELEASE  99/2000   bilateral  . COLONOSCOPY WITH PROPOFOL N/A 12/13/2012   Procedure: COLONOSCOPY WITH PROPOFOL;  Surgeon: Milus Banister, MD;  Location: [facility] ENDOSCOPY;  Service: Endoscopy;  Laterality: N/A;  . ELECTROPHYSIOLOGIC STUDY N/A 04/21/2015   Procedure: Atrial Fibrillation Ablation;  Surgeon: Thompson Grayer, MD;  Location: [facility] CV LAB;  Service: Cardiovascular;  Laterality: N/A;  . HERNIA REPAIR    . KNEE SURGERY  6-22yrs ago   left  . LEFT HEART CATH AND CORONARY ANGIOGRAPHY N/A 09/19/2016   Procedure: Left Heart Cath and  Coronary Angiography;  Surgeon: Belva Crome, MD;  Location: [facility] LAB;  Service: Cardiovascular;  Laterality: N/A;  . Left inguinal hernia repair     as a child  . NASAL SEPTOPLASTY W/ TURBINOPLASTY Bilateral 02/19/2014   Procedure: NASAL SEPTOPLASTY WITH BILATERAL TURBINATE REDUCTION;  Surgeon: Jodi Marble, MD;  Location: [facility];  Service: ENT;  Laterality: Bilateral;  . NECK SURGERY  58yrs ago  . right shoulder surgery  4-85yrs ago   cyst removed  . TEE WITHOUT CARDIOVERSION  05/05/2011   Procedure: TRANSESOPHAGEAL ECHOCARDIOGRAM (TEE);  Surgeon: Lelon Perla, MD;  Location: [facility] ENDOSCOPY;  Service: Cardiovascular;  Laterality: N/A;  . TEE WITHOUT CARDIOVERSION N/A 04/21/2015   Procedure: TRANSESOPHAGEAL ECHOCARDIOGRAM (TEE);  Surgeon: Sueanne Margarita, MD;  Location[facility] ENDOSCOPY;  Service: Cardiovascular;  Laterality: N/A;  . THROAT SURGERY  4-59yrs ago   "thought " it was cancer but came back not  . TONSILLECTOMY      reports that he has been smoking cigarettes.  He has a 12.00 pack-year smoking history. He has never used smokeless tobacco. He reports that he drinks alcohol. He reports that he does not use drugs. family history includes Diabetes in his maternal grandmother and mother; Heart disease in his father; Lung cancer in his father. Allergies  Allergen Reactions  . Adhesive [Tape]     itching  . Latex Itching    When tape is on the skin too long skin gets red & itching      Outpatient Encounter Medications as of 12/05/2017  Medication Sig  . albuterol (PROAIR HFA) 108 (90 Base) MCG/ACT inhaler Inhale 1-2 puffs into the lungs every 6 (six) hours as needed for wheezing or shortness of breath.  . bisoprolol (ZEBETA) 10 MG tablet Take 10 mg by mouth daily.  . chlorthalidone (HYGROTON) 25 MG tablet Take 25 mg by mouth daily.  . Cholecalciferol (VITAMIN D) 2000 UNITS CAPS Take 2,000 Units by mouth daily.   [name] diltiazem (CARDIZEM CD) 120 MG 24 hr capsule Take 1 capsule  (120 mg total) by mouth daily.  [name] gabapentin (NEURONTIN) 100 MG capsule Take 100 mg by mouth 2 (two) times daily.  [name] glimepiride (AMARYL) 1 MG tablet Take 1 mg by mouth daily before breakfast.   . hydrochlorothiazide (HYDRODIURIL) 25 MG tablet Take 25 mg by mouth daily.  [name] HYDROcodone-acetaminophen (NORCO) 7.5-325 MG tablet Take 1 tablet by mouth every 6 (six) hours as needed for moderate pain.  . isosorbide mononitrate (IMDUR) 30 MG 24 hr tablet TAKE 1 TABLET BY MOUTH DAILY  . Liraglutide (VICTOZA) 18 MG/3ML SOPN Inject 1.2 mg into the skin daily.   [name] losartan (COZAAR) 50 MG tablet Take 1 tablet (50 mg total) by mouth daily.  . metFORMIN (GLUCOPHAGE) 1000 MG  tablet Take 1,000 mg by mouth 2 (two) times daily.   . Multiple Vitamin (MULTIVITAMIN WITH MINERALS) TABS Take 1 tablet by mouth daily.  [name] omeprazole (PRILOSEC) 20 MG capsule Take 20 mg by mouth at bedtime.   . potassium chloride (KLOR-CON 10) 10 MEQ tablet Take 10 mEq by mouth daily.   . pravastatin (PRAVACHOL) 80 MG tablet TAKE 1 TABLET BY MOUTH EACH NIGHT AT BEDTIME  . sildenafil (REVATIO) 20 MG tablet Take 20-80 mg by mouth daily as needed (erectile dysfunction).  [name] umeclidinium-vilanterol (ANORO ELLIPTA) 62.5-25 MCG/INH AEPB Inhale 1 puff into the lungs daily.  [name] warfarin (COUMADIN) 5 MG tablet TAKE 1 TO 1 AND 1/2 TABLETS BY MOUTH DAILY AS NEEDED AND AS DIRECTED BY THE COUMADIN CLINIC  . nitroGLYCERIN (NITROSTAT) 0.4 MG SL tablet Place 1 tablet (0.4 mg total) under the tongue every 5 (five) minutes x 3 doses as needed for chest pain. (Patient not taking: Reported on 12/05/2017)  . [DISCONTINUED] diltiazem (CARDIZEM) 60 MG tablet Take 30 mg by mouth 2 (two) times daily as needed (afib).   No facility-administered encounter medications on file as of 12/05/2017.      REVIEW OF SYSTEMS  : All other systems reviewed and negative except where noted in the History of Present Illness.   PHYSICAL EXAM: BP 130/64 (BP Location: Left Arm,  Patient Position: Sitting, Cuff Size: Normal)   Pulse 80   Ht 6\' 1"  (1.854 m) Comment: height measured without shoes  Wt 247 lb 2 oz (112.1 kg)   BMI 32.60 kg/m   General: Well developed white male in no acute distress Head: Normocephalic and atraumatic Eyes:  Sclerae anicteric, conjunctiva pink. Ears: Normal auditory acuity Lungs: Clear throughout to auscultation; no increased WOB. Heart: Regular rate and rhythm; no M/R/G. Abdomen: Soft, non-distended.  BS present.  Mild diffuse TTP but more in LLQ. Rectal:  Will be done at the time of colonoscopy. Musculoskeletal: Symmetrical with no gross deformities  Skin: No lesions on visible extremities Extremities: No edema  Neurological: Alert oriented x 4, grossly non-focal Psychological:  Alert and cooperative. Normal mood and affect  ASSESSMENT AND PLAN: *Personal history of colon polyps:  Last colonoscopy was 11/2012 with repeat recommended in 3 years from that time due to multiple polyps and one large polyp in 2013.  Will schedule with Dr. Ardis Hughs. *Rectal bleeding:  Suspect hemorrhoidal bleeding, but will evaluate with colonoscopy. *Change in bowel habits:  Alternates between some constipation and then loose stools with urgency. *Generalized abdominal pain:  Quite significant at times and tender on exam today.  Will get a CT scan abdomen and pelvis with contrast.  Will give Bentyl 20 mg BID to use for now. *Chronic anticoagulation with coumadin for history of atrial fibrillation:  Will hold coumadin for 5 days prior to endoscopic procedures - will instruct when and how to resume after procedure. Benefits and risks of procedure explained including risks of bleeding, perforation, infection, missed lesions, reactions to medications and possible need for hospitalization and surgery for complications. Additional rare but real risk of stroke or other vascular clotting events off of coumadin also explained and need to seek urgent help if any signs of  these problems occur. Will communicate by phone or EMR with patient's prescribing provider, Dr. Percival Spanish, to confirm that holding coumadin is reasonable in this case.    CC:  [name], MD

## 2017-12-06 NOTE — Progress Notes (Signed)
I agree with the above note, plan 

## 2017-12-07 ENCOUNTER — Ambulatory Visit (INDEPENDENT_AMBULATORY_CARE_PROVIDER_SITE_OTHER): Payer: BLUE CROSS/BLUE SHIELD | Admitting: *Deleted

## 2017-12-07 ENCOUNTER — Ambulatory Visit (INDEPENDENT_AMBULATORY_CARE_PROVIDER_SITE_OTHER): Payer: BLUE CROSS/BLUE SHIELD | Admitting: Pulmonary Disease

## 2017-12-07 ENCOUNTER — Encounter: Payer: Self-pay | Admitting: Pulmonary Disease

## 2017-12-07 DIAGNOSIS — I480 Paroxysmal atrial fibrillation: Secondary | ICD-10-CM

## 2017-12-07 DIAGNOSIS — Z5181 Encounter for therapeutic drug level monitoring: Secondary | ICD-10-CM

## 2017-12-07 DIAGNOSIS — Z720 Tobacco use: Secondary | ICD-10-CM | POA: Diagnosis not present

## 2017-12-07 DIAGNOSIS — J449 Chronic obstructive pulmonary disease, unspecified: Secondary | ICD-10-CM

## 2017-12-07 DIAGNOSIS — G4733 Obstructive sleep apnea (adult) (pediatric): Secondary | ICD-10-CM | POA: Diagnosis not present

## 2017-12-07 LAB — POCT INR: INR: 2.4 (ref 2.0–3.0)

## 2017-12-07 NOTE — Patient Instructions (Signed)
Description   Continue on same dosage 1 pill of Coumadin everyday except 1.5 pills on Mondays, Wednesdays, and Fridays.  Recheck in 4 weeks.  Call with any new medications or scheduled for any procedures [phone]

## 2017-12-07 NOTE — Assessment & Plan Note (Signed)
He is planning a quit attempt next week. He does not want nicotine supplements or Chantix

## 2017-12-07 NOTE — Progress Notes (Signed)
   Subjective:    Patient ID: [name], male    DOB: 1956/01/13, 62 y.o.   MRN: [id number]  HPI  62 yo smoker followed for COPD and obstructive sleep apnea Patient has chronic A. fib status post ablation in 2016 Has OSA -CPAP intolerant  He continues to smoke to 3 cigarettes a day.  He is planning a trip to [place] is trying to make a quit attempt. He had an acute bronchitis 09/2017 and improved after a course of Augmentin.  He has been sober now for 5 years .  He was intolerant of CPAP in the past, continues on medications for atrial fibrillation and hypertension.    Significant tests/ events  Spirometry 01/2010 showed ratio of 68, FEV 1 was 72%, smaller airways 55%, no significant BD response   PSG from [facility] - severe with AHI 31.h corrected by CPAP 5 cm (wt was 250 lbs)  11/2011 Underwent neck sx >>given pre-op prednisone for asthmatic bronchitis -uneventful post op   09/2012 FEV1 67%, no BD response  Spirometry 2015 with FEV1 59%, ratio 61    Review of Systems Patient denies significant dyspnea,cough, hemoptysis,  chest pain, palpitations, pedal edema, orthopnea, paroxysmal nocturnal dyspnea, lightheadedness, nausea, vomiting, abdominal or  leg pains      Objective:   Physical Exam   Gen. Pleasant, well-nourished, in no distress ENT - no thrush, no post nasal drip Neck: No JVD, no thyromegaly, no carotid bruits Lungs: no use of accessory muscles, no dullness to percussion, decreased  without rales or rhonchi  Cardiovascular: Rhythm regular, heart sounds  normal, no murmurs or gallops, no peripheral edema Musculoskeletal: No deformities, no cyanosis or clubbing  Tremors +        Assessment & Plan:

## 2017-12-07 NOTE — Assessment & Plan Note (Signed)
ctanoro Albuterol prn for rescue

## 2017-12-07 NOTE — Telephone Encounter (Signed)
Patient with diagnosis of atrial fibrillation on warfarin for anticoagulation.    Procedure: colonoscopy Date of procedure: TBD  CHADS2-VASc score of  2 (, HTN,, DM2, )  CrCl 146.5 Platelet count 127  Per office protocol, patient can hold warfarin for 5 days prior to procedure.    Patient will not need bridging with Lovenox (enoxaparin) around procedure.  Patient should restart warfarin on the evening of procedure or day after, at discretion of procedure MD

## 2017-12-07 NOTE — Assessment & Plan Note (Signed)
Intolerant of CPAP in the past.  Does not want to trial mouthguard

## 2017-12-07 NOTE — Patient Instructions (Signed)
Stay on Anoro Congratulations on trying to quit !

## 2017-12-08 ENCOUNTER — Ambulatory Visit (INDEPENDENT_AMBULATORY_CARE_PROVIDER_SITE_OTHER)
Admission: RE | Admit: 2017-12-08 | Discharge: 2017-12-08 | Disposition: A | Discharge status: Home / Self Care | Payer: BLUE CROSS/BLUE SHIELD | Source: Ambulatory Visit | Attending: Gastroenterology | Admitting: Gastroenterology

## 2017-12-08 DIAGNOSIS — R1032 Left lower quadrant pain: Secondary | ICD-10-CM

## 2017-12-08 MED ORDER — IOPAMIDOL (ISOVUE-300) INJECTION 61%
100.0000 mL | Freq: Once | INTRAVENOUS | Status: AC | PRN
  Administered 2017-12-08: 100 mL via INTRAVENOUS

## 2017-12-08 NOTE — Telephone Encounter (Signed)
   Primary Cardiologist: Dr Percival Spanish  Chart reviewed as part of pre-operative protocol coverage. Given past medical history and time since last visit, based on ACC/AHA guidelines, [name] would be at acceptable risk for the planned procedure without further cardiovascular testing.   Per office protocol, patient can hold warfarin for 5 days prior to procedure.   Patient will not need bridging with Lovenox (enoxaparin) around procedure. Patient should restart warfarin on the evening of procedure or day after, at discretion of procedure MD  I will route this recommendation to the requesting party via [facility] fax function and remove from pre-op pool.  Please call with questions.  Kerin Ransom, PA-C 12/08/2017, 9:02 AM

## 2017-12-26 ENCOUNTER — Encounter: Payer: Self-pay | Admitting: Gastroenterology

## 2017-12-26 ENCOUNTER — Ambulatory Visit (INDEPENDENT_AMBULATORY_CARE_PROVIDER_SITE_OTHER): Payer: BLUE CROSS/BLUE SHIELD | Admitting: Gastroenterology

## 2017-12-26 ENCOUNTER — Other Ambulatory Visit (INDEPENDENT_AMBULATORY_CARE_PROVIDER_SITE_OTHER): Payer: BLUE CROSS/BLUE SHIELD

## 2017-12-26 VITALS — BP 134/76 | HR 80 | Ht 73.0 in | Wt 249.5 lb

## 2017-12-26 DIAGNOSIS — R1032 Left lower quadrant pain: Secondary | ICD-10-CM | POA: Diagnosis not present

## 2017-12-26 DIAGNOSIS — K7460 Unspecified cirrhosis of liver: Secondary | ICD-10-CM

## 2017-12-26 DIAGNOSIS — Z7901 Long term (current) use of anticoagulants: Secondary | ICD-10-CM

## 2017-12-26 DIAGNOSIS — Z86010 Personal history of colonic polyps: Secondary | ICD-10-CM

## 2017-12-26 DIAGNOSIS — K625 Hemorrhage of anus and rectum: Secondary | ICD-10-CM

## 2017-12-26 LAB — IBC PANEL
Iron: 67 ug/dL (ref 42–165)
Saturation Ratios: 17 % — ABNORMAL LOW (ref 20.0–50.0)
Transferrin: 282 mg/dL (ref 212.0–360.0)

## 2017-12-26 LAB — HEPATIC FUNCTION PANEL
ALT: 22 U/L (ref 0–53)
AST: 23 U/L (ref 0–37)
Albumin: 4.2 g/dL (ref 3.5–5.2)
Alkaline Phosphatase: 99 U/L (ref 39–117)
Bilirubin, Direct: 0.1 mg/dL (ref 0.0–0.3)
Total Bilirubin: 0.4 mg/dL (ref 0.2–1.2)
Total Protein: 6.9 g/dL (ref 6.0–8.3)

## 2017-12-26 LAB — PROTIME-INR
INR: 1.8 ratio — ABNORMAL HIGH (ref 0.8–1.0)
Prothrombin Time: 21.1 s — ABNORMAL HIGH (ref 9.6–13.1)

## 2017-12-26 LAB — FERRITIN: Ferritin: 17.5 ng/mL — ABNORMAL LOW (ref 22.0–322.0)

## 2017-12-26 MED ORDER — PEG 3350-KCL-NABCB-NACL-NASULF 236 G PO SOLR: 4000.0000 mL | Freq: Once | ORAL | 0 refills | Status: AC

## 2017-12-26 NOTE — Progress Notes (Signed)
12/26/2017 DASON MOSLEY [id number] October 26, 1955   HISTORY OF PRESENT ILLNESS:  Patient here for follow-up.  Was seen here on 7/23 and was supposed to schedule colonoscopy for history of colon polyps, but he did not schedule a date.  He is on coumadin for PAF and we did already get approval to hold the coumadin prior to the procedure.  Last colonoscopy was 11/2012 with repeat recommended in 3 years from that time due to multiple polyps and one large polyp in 2013.   He had a CT scan performed for complaints of abdominal pain.  CT scan did not show any source of pain, but did show cirrhosis with portal venous hypertension, portal venous collaterals, splenomegaly, and upper abdominal lymphadenopathy with no worrisome lesions.  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  Has been taking Bentyl 20 mg BID that I gave him at his last visit and is not having any abdominal pain currently.  Says that he did not think it was helping at first.  ? If it is helping or if pain just got better.   Past Medical History:  Diagnosis Date  . Arthritis    lower back  . Asthma   . Atrial flutter ([place])    s/p CTI ablation by Dr Rayann Heman  . Brain aneurysm 2009   questionable. A follow up CTA in 2009 showed no evidence of  . Chronic back pain    DDD/stenosis  . Colon polyps    9 polyps removed 10/13/11  . Complication of anesthesia September 11, 2012   slow to awaken after ablation  . COPD (chronic obstructive pulmonary disease) ([place])   . Diabetes mellitus    takes Metformin and Glimepiride daily  . Emphysema   . GERD (gastroesophageal reflux disease)    takes Omeprazole bid  . History of shingles   . HLD (hyperlipidemia)    takes Pravastatin daily  . HTN (hypertension)    takes Prinizide daily  . Obesity   . OSA (obstructive sleep apnea)    not always using cpap  . Overdose 2009   unintentional Flecanide overdose  . Peripheral neuropathy   . Short-term memory loss    . Tobacco abuse    Past Surgical History:  Procedure Laterality Date  . ANTERIOR CERVICAL DECOMP/DISCECTOMY FUSION  01/04/2012   Procedure: ANTERIOR CERVICAL DECOMPRESSION/DISCECTOMY FUSION 1 LEVEL/HARDWARE REMOVAL;  Surgeon: Ophelia Charter, MD;  Location: [place] [facility] NEURO ORS;  Service: Neurosurgery;  Laterality: N/A;  explore cervical fusion Cervical six - seven  with removal of codman plate anterior cervical decompression with fusion interbody prothesis plating and bonegraft  . APPENDECTOMY  2-25yrs ago  . ATRIAL FIBRILLATION ABLATION N/A 09/11/2012   PT DID NOT HAVE AN ATRIAL FIBRILLATION ABLATION IN 2014!  ATRIAL FLUTTER ABLATION ONLY  . ATRIAL FLUTTER ABLATION  09/11/2012   CTI ablation by Dr Rayann Heman  . CARDIAC CATHETERIZATION  2008   no significant CAD  . CARDIOVERSION  05/05/2011   Procedure: CARDIOVERSION;  Surgeon: Lelon Perla, MD;  Location[facility] ENDOSCOPY;  Service: Cardiovascular;  Laterality: N/A;  . CARDIOVERSION Bilateral 07/26/2012   Procedure: CARDIOVERSION;  Surgeon: Minus Breeding, MD;  Location: [facility] ENDOSCOPY;  Service: Cardiovascular;  Laterality: Bilateral;  . CARPAL TUNNEL RELEASE  99/2000   bilateral  . COLONOSCOPY WITH PROPOFOL N/A 12/13/2012   Procedure: COLONOSCOPY WITH PROPOFOL;  Surgeon: Milus Banister, MD;  Location: [facility] ENDOSCOPY;  Service: Endoscopy;  Laterality: N/A;  . ELECTROPHYSIOLOGIC STUDY N/A 04/21/2015   Procedure: Atrial Fibrillation Ablation;  Surgeon: Thompson Grayer, MD;  Location: [place] CV LAB;  Service: Cardiovascular;  Laterality: N/A;  . HERNIA REPAIR    . KNEE SURGERY  6-37yrs ago   left  . LEFT HEART CATH AND CORONARY ANGIOGRAPHY N/A 09/19/2016   Procedure: Left Heart Cath and Coronary Angiography;  Surgeon: Belva Crome, MD;  Location: [place] CV LAB;  Service: Cardiovascular;  Laterality: N/A;  . Left inguinal hernia repair     as a child  . NASAL SEPTOPLASTY W/ TURBINOPLASTY Bilateral 02/19/2014   Procedure: NASAL SEPTOPLASTY WITH  BILATERAL TURBINATE REDUCTION;  Surgeon: Jodi Marble, MD;  Location: [facility];  Service: ENT;  Laterality: Bilateral;  . NECK SURGERY  69yrs ago  . right shoulder surgery  4-18yrs ago   cyst removed  . TEE WITHOUT CARDIOVERSION  05/05/2011   Procedure: TRANSESOPHAGEAL ECHOCARDIOGRAM (TEE);  Surgeon: Lelon Perla, MD;  Location: [facility] ENDOSCOPY;  Service: Cardiovascular;  Laterality: N/A;  . TEE WITHOUT CARDIOVERSION N/A 04/21/2015   Procedure: TRANSESOPHAGEAL ECHOCARDIOGRAM (TEE);  Surgeon: Sueanne Margarita, MD;  Location: [facility] ENDOSCOPY;  Service: Cardiovascular;  Laterality: N/A;  . THROAT SURGERY  4-19yrs ago   "thought " it was cancer but came back not  . TONSILLECTOMY      reports that he has been smoking cigarettes. He has a 12.00 pack-year smoking history. He has never used smokeless tobacco. He reports that he drinks alcohol. He reports that he does not use drugs. family history includes Diabetes in his maternal grandmother and mother; Heart disease in his father; Lung cancer in his father. Allergies  Allergen Reactions  . Adhesive [Tape]     itching  . Latex Itching    When tape is on the skin too long skin gets red & itching      Outpatient Encounter Medications as of 12/26/2017  Medication Sig  . albuterol (PROAIR HFA) 108 (90 Base) MCG/ACT inhaler Inhale 1-2 puffs into the lungs every 6 (six) hours as needed for wheezing or shortness of breath.  . bisoprolol (ZEBETA) 10 MG tablet Take 10 mg by mouth daily.  . Cholecalciferol (VITAMIN D) 2000 UNITS CAPS Take 2,000 Units by mouth daily.   [name] dicyclomine (BENTYL) 20 MG tablet Take 1 tablet (20 mg total) by mouth 2 (two) times daily.  [name] diltiazem (CARDIZEM CD) 120 MG 24 hr capsule Take 1 capsule (120 mg total) by mouth daily.  [name] gabapentin (NEURONTIN) 100 MG capsule Take 100 mg by mouth 2 (two) times daily.  [name] glimepiride (AMARYL) 1 MG tablet Take 1 mg by mouth daily before breakfast.   . hydrochlorothiazide (HYDRODIURIL) 25 MG tablet  Take 25 mg by mouth daily.  [name] HYDROcodone-acetaminophen (NORCO) 7.5-325 MG tablet Take 1 tablet by mouth every 6 (six) hours as needed for moderate pain.  . isosorbide mononitrate (IMDUR) 30 MG 24 hr tablet TAKE 1 TABLET BY MOUTH DAILY  . Liraglutide (VICTOZA) 18 MG/3ML SOPN Inject 1.2 mg into the skin daily.   . metFORMIN (GLUCOPHAGE) 1000 MG tablet Take 1,000 mg by mouth 2 (two) times daily.   . Multiple Vitamin (MULTIVITAMIN WITH MINERALS) TABS Take 1 tablet by mouth daily.  . nitroGLYCERIN (NITROSTAT) 0.4 MG SL tablet Place 1 tablet (0.4 mg total) under the tongue every 5 (five) minutes x 3 doses as needed for chest pain.  [name] omeprazole (PRILOSEC) 20 MG capsule Take 20 mg by mouth at bedtime.   [name]  potassium chloride (KLOR-CON 10) 10 MEQ tablet Take 10 mEq by mouth daily.   . pravastatin (PRAVACHOL) 80 MG tablet TAKE 1 TABLET BY MOUTH EACH NIGHT AT BEDTIME  . sildenafil (REVATIO) 20 MG tablet Take 20-80 mg by mouth daily as needed (erectile dysfunction).  [name] umeclidinium-vilanterol (ANORO ELLIPTA) 62.5-25 MCG/INH AEPB Inhale 1 puff into the lungs daily.  [name] warfarin (COUMADIN) 5 MG tablet TAKE 1 TO 1 AND 1/2 TABLETS BY MOUTH DAILY AS NEEDED AND AS DIRECTED BY THE COUMADIN CLINIC  . chlorthalidone (HYGROTON) 25 MG tablet Take 25 mg by mouth daily.  [name] losartan (COZAAR) 50 MG tablet Take 1 tablet (50 mg total) by mouth daily.   No facility-administered encounter medications on file as of 12/26/2017.      REVIEW OF SYSTEMS  : All other systems reviewed and negative except where noted in the History of Present Illness.   PHYSICAL EXAM: BP 134/76   Pulse 80   Ht 6\' 1"  (1.854 m)   Wt 249 lb 8 oz (113.2 kg)   BMI 32.92 kg/m  General: Well developed white male in no acute distress Head: Normocephalic and atraumatic Eyes:  Sclerae anicteric, conjunctiva pink. Ears: Normal auditory acuity Lungs: Clear throughout to auscultation; no increased WOB. Heart: Regular rate and rhythm; no  M/R/G. Abdomen: Soft, non-distended.  BS present.  Non-tender. Rectal:  Will be done at the time of colonoscopy. Musculoskeletal: Symmetrical with no gross deformities  Skin: No lesions on visible extremities Extremities: No edema  Neurological: Alert oriented x 4, grossly non-focal Psychological:  Alert and cooperative. Normal mood and affect  ASSESSMENT AND PLAN: *Personal history of colon polyps:  Last colonoscopy was 11/2012 with repeat recommended in 3 years from that time due to multiple polyps and one large polyp in 2013.  Will schedule with Dr. Ardis Hughs.  Already obtained clearance to hold coumadin as he was going to schedule at his visit last month, but never did. *Cirrhosis:  New diagnosis.  This was seen on CT scan.  Was a heavy ETOH drinker in the past for a long time, but has not drank for 5 years.  He thinks it is due to his medications, which certainly could have been part of it over the years.  Will check other labs/serologies for other causes of liver disease.  Check AFP.  Also needs EGD for screening of esophageal varices.  Will schedule for that as well.   *Abdominal pain:  No source found on CT scan.  Still taking Bentyl and not having any pain currently.   CC:  [name], MD

## 2017-12-26 NOTE — Patient Instructions (Addendum)
If you are age 60 or older, your body mass index should be between 23-30. Your Body mass index is 32.92 kg/m. If this is out of the aforementioned range listed, please consider follow up with your Primary Care Provider.  If you are age 2 or younger, your body mass index should be between 19-25. Your Body mass index is 32.92 kg/m. If this is out of the aformentioned range listed, please consider follow up with your Primary Care Provider.   You have been scheduled for an endoscopy and colonoscopy. Please follow the written instructions given to you at your visit today. Please pick up your prep supplies at the pharmacy within the next 1-3 days. If you use inhalers (even only as needed), please bring them with you on the day of your procedure. Your physician has requested that you go to www.startemmi.com and enter the access code given to you at your visit today. This web site gives a general overview about your procedure. However, you should still follow specific instructions given to you by our office regarding your preparation for the procedure.  Your provider has requested that you go to the basement level for lab work before leaving today. Press "B" on the elevator. The lab is located at the first door on the left as you exit the elevator.  We have sent the following medications to your pharmacy for you to pick up at your convenience: [place]. On 02/25/18 start holding Coumadin.  Thank you for choosing me and [facility].   Alonza Bogus, PA-C

## 2017-12-27 NOTE — Progress Notes (Signed)
I agree with the above note, plan 

## 2017-12-28 LAB — HEPATITIS B SURFACE ANTIBODY,QUALITATIVE: Hep B S Ab: NONREACTIVE

## 2017-12-28 LAB — CERULOPLASMIN: Ceruloplasmin: 31 mg/dL (ref 18–36)

## 2017-12-28 LAB — MITOCHONDRIAL ANTIBODIES: Mitochondrial M2 Ab, IgG: <=20 U

## 2017-12-28 LAB — HEPATITIS C ANTIBODY
Hepatitis C Ab: NONREACTIVE
SIGNAL TO CUT-OFF: 0.03 (ref <1.00)

## 2017-12-28 LAB — HEPATITIS B SURFACE ANTIGEN: Hepatitis B Surface Ag: NONREACTIVE

## 2017-12-28 LAB — ANTI-SMOOTH MUSCLE ANTIBODY, IGG: Actin (Smooth Muscle) Antibody (IGG): <20 U (ref <20)

## 2017-12-28 LAB — AFP TUMOR MARKER: AFP-Tumor Marker: 2.3 ng/mL (ref <6.1)

## 2017-12-28 LAB — ANA: ANA: NEGATIVE

## 2017-12-28 LAB — ALPHA-1-ANTITRYPSIN: A-1 Antitrypsin, Ser: 136 mg/dL (ref 83–199)

## 2017-12-28 LAB — HEPATITIS A ANTIBODY, TOTAL: Hepatitis A AB,Total: NONREACTIVE

## 2018-01-08 ENCOUNTER — Encounter: Payer: Self-pay | Admitting: Cardiology

## 2018-01-08 ENCOUNTER — Ambulatory Visit (INDEPENDENT_AMBULATORY_CARE_PROVIDER_SITE_OTHER): Payer: BLUE CROSS/BLUE SHIELD | Admitting: Pharmacist

## 2018-01-08 ENCOUNTER — Ambulatory Visit (INDEPENDENT_AMBULATORY_CARE_PROVIDER_SITE_OTHER): Payer: BLUE CROSS/BLUE SHIELD | Admitting: Cardiology

## 2018-01-08 VITALS — BP 152/80 | HR 68 | Ht 73.0 in | Wt 250.2 lb

## 2018-01-08 DIAGNOSIS — I10 Essential (primary) hypertension: Secondary | ICD-10-CM | POA: Diagnosis not present

## 2018-01-08 DIAGNOSIS — I480 Paroxysmal atrial fibrillation: Secondary | ICD-10-CM | POA: Diagnosis not present

## 2018-01-08 DIAGNOSIS — Z5181 Encounter for therapeutic drug level monitoring: Secondary | ICD-10-CM

## 2018-01-08 LAB — POCT INR: INR: 1.8 — AB (ref 2.0–3.0)

## 2018-01-08 NOTE — Progress Notes (Signed)
01/08/2018 [name]   February 13, 1956  [id number]  Primary Physician [name], MD Primary Cardiologist: Dr Percival Spanish  HPI:  62 y/o male, works Engineer, manufacturing. He has a prior history of atrial fibrillation and atrial flutter. He is s/p flutter ablation in 2014 and has actually done well from this standpoint. He had a cath in May 2018 that showed widely patent coronaries and normal LVF with moderate LVH.  He does have significant COPD, sleep apnea (intolerant to C-pap), HTN, chronic back pain, and NIDDM with what sounds like LE neuropathy. He is in the office today for follow up. He says he has actually been doing pretty well. He has lots of complaints, shoulders, back, and pain in his feet at night that he thinks is from "dehydration". He cut his diuretic in halls though we're not really sure what dose he is talking, he forgot to bring his medications. He denies chest pain or unusual dyspnea. He is down to a couple of cigarettes a day.    Current Outpatient Medications  Medication Sig Dispense Refill  . albuterol (PROAIR HFA) 108 (90 Base) MCG/ACT inhaler Inhale 1-2 puffs into the lungs every 6 (six) hours as needed for wheezing or shortness of breath. 1 Inhaler 5  . bisoprolol (ZEBETA) 10 MG tablet Take 10 mg by mouth daily.    . chlorthalidone (HYGROTON) 25 MG tablet Take 25 mg by mouth daily.    . Cholecalciferol (VITAMIN D) 2000 UNITS CAPS Take 2,000 Units by mouth daily.     [name] dicyclomine (BENTYL) 20 MG tablet Take 1 tablet (20 mg total) by mouth 2 (two) times daily. 60 tablet 2  . diltiazem (CARDIZEM CD) 120 MG 24 hr capsule Take 1 capsule (120 mg total) by mouth daily. 30 capsule 4  . gabapentin (NEURONTIN) 100 MG capsule Take 100 mg by mouth 2 (two) times daily.    [name] glimepiride (AMARYL) 1 MG tablet Take 1 mg by mouth daily before breakfast.     . hydrochlorothiazide (HYDRODIURIL) 25 MG tablet Take 25 mg by mouth daily.    [name] HYDROcodone-acetaminophen (NORCO) 7.5-325 MG  tablet Take 1 tablet by mouth every 6 (six) hours as needed for moderate pain.    . isosorbide mononitrate (IMDUR) 30 MG 24 hr tablet TAKE 1 TABLET BY MOUTH DAILY 30 tablet 10  . Liraglutide (VICTOZA) 18 MG/3ML SOPN Inject 1.2 mg into the skin daily.     . metFORMIN (GLUCOPHAGE) 1000 MG tablet Take 1,000 mg by mouth 2 (two) times daily.     . Multiple Vitamin (MULTIVITAMIN WITH MINERALS) TABS Take 1 tablet by mouth daily.    . nitroGLYCERIN (NITROSTAT) 0.4 MG SL tablet Place 1 tablet (0.4 mg total) under the tongue every 5 (five) minutes x 3 doses as needed for chest pain. 25 tablet 3  . omeprazole (PRILOSEC) 20 MG capsule Take 20 mg by mouth at bedtime.     . potassium chloride (KLOR-CON 10) 10 MEQ tablet Take 10 mEq by mouth daily.     . pravastatin (PRAVACHOL) 80 MG tablet TAKE 1 TABLET BY MOUTH EACH NIGHT AT BEDTIME 30 tablet 10  . sildenafil (REVATIO) 20 MG tablet Take 20-80 mg by mouth daily as needed (erectile dysfunction).    [name] umeclidinium-vilanterol (ANORO ELLIPTA) 62.5-25 MCG/INH AEPB Inhale 1 puff into the lungs daily. 1 each 3  . warfarin (COUMADIN) 5 MG tablet TAKE 1 TO 1 AND 1/2 TABLETS BY MOUTH DAILY AS NEEDED AND AS DIRECTED BY THE  COUMADIN CLINIC 45 tablet 4  . losartan (COZAAR) 50 MG tablet Take 1 tablet (50 mg total) by mouth daily. 90 tablet 3   No current facility-administered medications for this visit.     Allergies  Allergen Reactions  . Adhesive [Tape]     itching  . Latex Itching    When tape is on the skin too long skin gets red & itching    Past Medical History:  Diagnosis Date  . Arthritis    lower back  . Asthma   . Atrial flutter ([place])    s/p CTI ablation by Dr Rayann Heman  . Brain aneurysm 2009   questionable. A follow up CTA in 2009 showed no evidence of  . Chronic back pain    DDD/stenosis  . Colon polyps    9 polyps removed 10/13/11  . Complication of anesthesia September 11, 2012   slow to awaken after ablation  . COPD (chronic obstructive pulmonary  disease) ([place])   . Diabetes mellitus    takes Metformin and Glimepiride daily  . Emphysema   . GERD (gastroesophageal reflux disease)    takes Omeprazole bid  . History of shingles   . HLD (hyperlipidemia)    takes Pravastatin daily  . HTN (hypertension)    takes Prinizide daily  . Obesity   . OSA (obstructive sleep apnea)    not always using cpap  . Overdose 2009   unintentional Flecanide overdose  . Peripheral neuropathy   . Short-term memory loss   . Tobacco abuse     Social History   Socioeconomic History  . Marital status: Married    Spouse name: Not on file  . Number of children: 2  . Years of education: Not on file  . Highest education level: Not on file  Occupational History  . Occupation: Paediatric nurse  . Financial resource strain: Not on file  . Food insecurity:    Worry: Not on file    Inability: Not on file  . Transportation needs:    Medical: Not on file    Non-medical: Not on file  Tobacco Use  . Smoking status: Current Every Day Smoker    Packs/day: 0.30    Years: 40.00    Pack years: 12.00    Types: Cigarettes  . Smokeless tobacco: Never Used  Substance and Sexual Activity  . Alcohol use: Yes    Alcohol/week: 0.0 standard drinks    Comment: 12/05/17 pt stated he has drinked 1/2 drink in 3 years  . Drug use: No  . Sexual activity: Yes  Lifestyle  . Physical activity:    Days per week: Not on file    Minutes per session: Not on file  . Stress: Not on file  Relationships  . Social connections:    Talks on phone: Not on file    Gets together: Not on file    Attends religious service: Not on file    Active member of club or organization: Not on file    Attends meetings of clubs or organizations: Not on file    Relationship status: Not on file  . Intimate partner violence:    Fear of current or ex partner: Not on file    Emotionally abused: Not on file    Physically abused: Not on file    Forced sexual activity: Not on file    Other Topics Concern  . Not on file  Social History Narrative   Daily caffeine(Mountain Dew)  Lives in [place] with spouse.   Unemployed due to chronic back/ leg pain           Family History  Problem Relation Age of Onset  . Diabetes Mother   . Heart disease Father   . Lung cancer Father   . Diabetes Maternal Grandmother   . Colon cancer Neg Hx   . Anesthesia problems Neg Hx   . Hypotension Neg Hx   . Malignant hyperthermia Neg Hx   . Pseudochol deficiency Neg Hx      Review of Systems: General: negative for chills, fever, night sweats or weight changes.  Cardiovascular: negative for chest pain, dyspnea on exertion, edema, orthopnea, palpitations, paroxysmal nocturnal dyspnea or shortness of breath Dermatological: negative for rash Respiratory: negative for cough or wheezing Urologic: negative for hematuria Abdominal: negative for nausea, vomiting, diarrhea, bright red blood per rectum, melena, or hematemesis Neurologic: negative for visual changes, syncope, or dizziness All other systems reviewed and are otherwise negative except as noted above.    Blood pressure (!) 152/80, pulse 68, height 6\' 1"  (1.854 m), weight 250 lb 3.2 oz (113.5 kg).  General appearance: alert, cooperative and no distress Neck: no carotid bruit and no JVD Lungs: clear to auscultation bilaterally Heart: regular rate and rhythm Extremities: no edema Skin: cool, pale, dry Neurologic: Grossly normal  EKG NSR  ASSESSMENT AND PLAN:    Palpitations Worse on increased diltiazem in the past- dose cut back to 120 mg.  Currently stable  COPD (chronic obstructive pulmonary disease) ([place]) Followed by Dr Elsworth Soho- currently stable  Essential hypertension 138/62 by me. He has not taken his medications yet this am.   Non-insulin dependent type 2 diabetes mellitus ([place]) NIDDM with LE neuropathy. I told him I thought it was unlikely his foot pain at night was secondary to  "dehydration".   PAF (paroxysmal atrial fibrillation) ([facility]) Holding NSR by recent EKGs  Long term current use of anticoagulant therapy Coumadin for PAF  Normal coronary arteries Cath 09/19/16 for chest pain  PLAN  No change in Rx. I asked to keep track of his B/P at home and to let us know if it was consistently running > 120/80, if so I would increase his Cozaar to 100 mg. I instructed him to bring his medications with him when he comes to the office next. We talked about putting down cigarettes altogether. F/U 6 months.   Kerin Ransom PA-C 01/08/2018 9:42 AM

## 2018-01-08 NOTE — Patient Instructions (Signed)
INCREASE dose to 1.5 pill of Coumadin everyday except 1 pills on Sunday, Tuesday and Thursday. Recheck in 2 weeks.  Call with any new medications or scheduled for any procedures [phone]

## 2018-01-08 NOTE — Patient Instructions (Signed)
Medication Instructions:  Your physician recommends that you continue on your current medications as directed. Please refer to the Current Medication list given to you today.   Labwork: None ordered  Testing/Procedures: None ordered  Follow-Up: Your physician wants you to follow-up in: 6 months with Dr. Allena Napoleon will receive a reminder letter in the mail two months in advance. If you don't receive a letter, please call our office to schedule the follow-up appointment.   Any Other Special Instructions Will Be Listed Below (If Applicable).     If you need a refill on your cardiac medications before your next appointment, please call your pharmacy.

## 2018-01-22 ENCOUNTER — Ambulatory Visit (INDEPENDENT_AMBULATORY_CARE_PROVIDER_SITE_OTHER): Payer: BLUE CROSS/BLUE SHIELD | Admitting: Pharmacist

## 2018-01-22 DIAGNOSIS — I480 Paroxysmal atrial fibrillation: Secondary | ICD-10-CM

## 2018-01-22 DIAGNOSIS — Z5181 Encounter for therapeutic drug level monitoring: Secondary | ICD-10-CM

## 2018-01-22 LAB — POCT INR: INR: 2.3 (ref 2.0–3.0)

## 2018-01-22 NOTE — Patient Instructions (Signed)
Continue dose to 1.5 pill of Coumadin everyday except 1 pills on Sunday, Tuesday and Thursday. Recheck in 4 weeks.  Call with any new medications or scheduled for any procedures [phone]

## 2018-02-16 ENCOUNTER — Encounter: Payer: Self-pay | Admitting: Gastroenterology

## 2018-02-19 ENCOUNTER — Encounter (INDEPENDENT_AMBULATORY_CARE_PROVIDER_SITE_OTHER): Payer: Self-pay

## 2018-02-19 ENCOUNTER — Ambulatory Visit (INDEPENDENT_AMBULATORY_CARE_PROVIDER_SITE_OTHER): Payer: BLUE CROSS/BLUE SHIELD

## 2018-02-19 DIAGNOSIS — I480 Paroxysmal atrial fibrillation: Secondary | ICD-10-CM

## 2018-02-19 DIAGNOSIS — Z5181 Encounter for therapeutic drug level monitoring: Secondary | ICD-10-CM | POA: Diagnosis not present

## 2018-02-19 LAB — POCT INR: INR: 2.7 (ref 2.0–3.0)

## 2018-02-19 NOTE — Patient Instructions (Signed)
Description   Continue on same dosage 1.5 tablets of Coumadin everyday except 1 tablets on Sundays, Tuesdays and Thursdays. Take your last dosage of Coumadin on 02/24/18 prior to colonoscopy and endoscopy on 03/02/18.  Resume Coumadin at previous dosage regimen once GI doctor states ok to do so.  Recheck in 7-10 days after procedure.  Call with any new medications or scheduled for any procedures [phone]

## 2018-02-26 ENCOUNTER — Other Ambulatory Visit: Payer: Self-pay | Admitting: Cardiology

## 2018-03-02 ENCOUNTER — Encounter: Payer: Self-pay | Admitting: Gastroenterology

## 2018-03-02 ENCOUNTER — Ambulatory Visit (AMBULATORY_SURGERY_CENTER): Payer: BLUE CROSS/BLUE SHIELD | Admitting: Gastroenterology

## 2018-03-02 VITALS — BP 102/51 | HR 68 | Temp 97.7°F | Resp 13 | Ht 73.0 in | Wt 250.0 lb

## 2018-03-02 DIAGNOSIS — D125 Benign neoplasm of sigmoid colon: Secondary | ICD-10-CM

## 2018-03-02 DIAGNOSIS — K7460 Unspecified cirrhosis of liver: Secondary | ICD-10-CM

## 2018-03-02 DIAGNOSIS — D128 Benign neoplasm of rectum: Secondary | ICD-10-CM | POA: Diagnosis not present

## 2018-03-02 DIAGNOSIS — K2970 Gastritis, unspecified, without bleeding: Secondary | ICD-10-CM | POA: Diagnosis not present

## 2018-03-02 DIAGNOSIS — D129 Benign neoplasm of anus and anal canal: Secondary | ICD-10-CM

## 2018-03-02 DIAGNOSIS — Z86010 Personal history of colonic polyps: Secondary | ICD-10-CM

## 2018-03-02 DIAGNOSIS — D120 Benign neoplasm of cecum: Secondary | ICD-10-CM

## 2018-03-02 DIAGNOSIS — D127 Benign neoplasm of rectosigmoid junction: Secondary | ICD-10-CM

## 2018-03-02 MED ORDER — SODIUM CHLORIDE 0.9 % IV SOLN: 500.0000 mL | Freq: Once | INTRAVENOUS | Status: DC

## 2018-03-02 NOTE — Progress Notes (Signed)
To [facility], VSS Report to [email or web address] 

## 2018-03-02 NOTE — Op Note (Addendum)
[facility] Patient Name: [name] Procedure Date: 03/02/2018 10:21 AM MRN: [id number] Endoscopist: Milus Banister , MD Age: 62 Referring MD:  Date of Birth: [birth year] Gender: Male Account #: [id number] Procedure:                Upper GI endoscopy Indications:              Cirrhosis rule out esophageal varices Medicines:                Monitored Anesthesia Care Procedure:                Pre-Anesthesia Assessment:                           - Prior to the procedure, a History and Physical                            was performed, and patient medications and                            allergies were reviewed. The patient's tolerance of                            previous anesthesia was also reviewed. The risks                            and benefits of the procedure and the sedation                            options and risks were discussed with the patient.                            All questions were answered, and informed consent                            was obtained. Prior Anticoagulants: The patient has                            taken Coumadin (warfarin), last dose was 5 days                            prior to procedure. ASA Grade Assessment: III - A                            patient with severe systemic disease. After                            reviewing the risks and benefits, the patient was                            deemed in satisfactory condition to undergo the                            procedure.  After obtaining informed consent, the endoscope was                            passed under direct vision. Throughout the                            procedure, the patient's blood pressure, pulse, and                            oxygen saturations were monitored continuously. The                            Endoscope was introduced through the mouth, and                            advanced to the second part of duodenum. The upper                           GI endoscopy was accomplished without difficulty.                            The patient tolerated the procedure well. Scope In: Scope Out: Findings:                 Mild inflammation characterized by erosions,                            erythema and friability was found in the gastric                            antrum. Biopsies were taken with a cold forceps for                            histology.                           The exam was otherwise without abnormality. Complications:            No immediate complications. Estimated blood loss:                            None. Estimated Blood Loss:     Estimated blood loss: none. Impression:               - Gastritis. Biopsied to check for H. pylori.                           - The examination was otherwise normal. Recommendation:           - Patient has a contact number available for                            emergencies. The signs and symptoms of potential                            delayed complications were discussed with the  patient. Return to normal activities tomorrow.                            Written discharge instructions were provided to the                            patient.                           - Resume previous diet.                           - Continue present medications. OK to resume your                            coumadin today.                           - Await pathology results.                           - Office visit with Dr. Ardis Hughs in 2-3 months for                            ongoing cirrhosis care.                           - Repeat EGD in 3 years. Milus Banister, MD 03/02/2018 10:58:32 AM This report has been signed electronically.

## 2018-03-02 NOTE — Patient Instructions (Signed)
HANDOUT GIVEN FOR POLYPS AND GASTRITIS  YOU MAY RESUME YOUR COUMADIN TODAY  SEE DR JACOBS IN THE OFFICE IN 2-3 MONTHS FOR ONGOING CIRRHOSIS CARE  YOU HAD AN ENDOSCOPIC PROCEDURE TODAY AT [facility]:   Refer to the procedure report that was given to you for any specific questions about what was found during the examination.  If the procedure report does not answer your questions, please call your gastroenterologist to clarify.  If you requested that your care partner not be given the details of your procedure findings, then the procedure report has been included in a sealed envelope for you to review at your convenience later.  YOU SHOULD EXPECT: Some feelings of bloating in the abdomen. Passage of more gas than usual.  Walking can help get rid of the air that was put into your GI tract during the procedure and reduce the bloating. If you had a lower endoscopy (such as a colonoscopy or flexible sigmoidoscopy) you may notice spotting of blood in your stool or on the toilet paper. If you underwent a bowel prep for your procedure, you may not have a normal bowel movement for a few days.  Please Note:  You might notice some irritation and congestion in your nose or some drainage.  This is from the oxygen used during your procedure.  There is no need for concern and it should clear up in a day or so.  SYMPTOMS TO REPORT IMMEDIATELY:   Following lower endoscopy (colonoscopy or flexible sigmoidoscopy):  Excessive amounts of blood in the stool  Significant tenderness or worsening of abdominal pains  Swelling of the abdomen that is new, acute  Fever of 100F or higher   Following upper endoscopy (EGD)  Vomiting of blood or coffee ground material  New chest pain or pain under the shoulder blades  Painful or persistently difficult swallowing  New shortness of breath  Fever of 100F or higher  Black, tarry-looking stools  For urgent or emergent issues, a gastroenterologist can be  reached at any hour by calling [phone].   DIET:  We do recommend a small meal at first, but then you may proceed to your regular diet.  Drink plenty of fluids but you should avoid alcoholic beverages for 24 hours.  ACTIVITY:  You should plan to take it easy for the rest of today and you should NOT DRIVE or use heavy machinery until tomorrow (because of the sedation medicines used during the test).    FOLLOW UP: Our staff will call the number listed on your records the next business day following your procedure to check on you and address any questions or concerns that you may have regarding the information given to you following your procedure. If we do not reach you, we will leave a message.  However, if you are feeling well and you are not experiencing any problems, there is no need to return our call.  We will assume that you have returned to your regular daily activities without incident.  If any biopsies were taken you will be contacted by phone or by letter within the next 1-3 weeks.  Please call us at [phone] if you have not heard about the biopsies in 3 weeks.    SIGNATURES/CONFIDENTIALITY: You and/or your care partner have signed paperwork which will be entered into your electronic medical record.  These signatures attest to the fact that that the information above on your After Visit Summary has been reviewed and is understood.  Full responsibility of the confidentiality of this discharge information lies with you and/or your care-partner.

## 2018-03-02 NOTE — Progress Notes (Signed)
Called to room to assist during endoscopic procedure.  Patient ID and intended procedure confirmed with present staff. Received instructions for my participation in the procedure from the performing physician.  

## 2018-03-02 NOTE — Op Note (Addendum)
[facility] Patient Name: [name] Procedure Date: 03/02/2018 10:21 AM MRN: [id number] Endoscopist: Milus Banister , MD Age: 62 Referring MD:  Date of Birth: 1955/11/07 Gender: Male Account #: [id number] Procedure:                Colonoscopy Indications:              High risk colon cancer surveillance: Personal                            history of colonic polyps; colonoscopy 2013 nine                            adenomas removed including a 1.5cm pedunculated TVA                            in sigmoid (site labeled with SPOT); colonoscopy                            2014 two subCM adenomas removed Medicines:                Monitored Anesthesia Care Procedure:                Pre-Anesthesia Assessment:                           - Prior to the procedure, a History and Physical                            was performed, and patient medications and                            allergies were reviewed. The patient's tolerance of                            previous anesthesia was also reviewed. The risks                            and benefits of the procedure and the sedation                            options and risks were discussed with the patient.                            All questions were answered, and informed consent                            was obtained. Prior Anticoagulants: The patient has                            taken Coumadin (warfarin), last dose was 5 days                            prior to procedure. ASA Grade Assessment: III - A  patient with severe systemic disease. After                            reviewing the risks and benefits, the patient was                            deemed in satisfactory condition to undergo the                            procedure.                           After obtaining informed consent, the colonoscope                            was passed under direct vision. Throughout the         procedure, the patient's blood pressure, pulse, and                            oxygen saturations were monitored continuously. The                            Colonoscope was introduced through the anus and                            advanced to the the cecum, identified by                            appendiceal orifice and ileocecal valve. The                            colonoscopy was performed without difficulty. The                            patient tolerated the procedure well. The quality                            of the bowel preparation was good. The ileocecal                            valve, appendiceal orifice, and rectum were                            photographed. Scope In: 10:29:52 AM Scope Out: 10:45:25 AM Scope Withdrawal Time: 0 hours 12 minutes 34 seconds  Total Procedure Duration: 0 hours 15 minutes 33 seconds  Findings:                 Three sessile polyps were found in the rectum,                            sigmoid colon and cecum. The polyps were 3 to 5 mm                            in  size. These polyps were removed with a cold                            snare. Resection and retrieval were complete.                           The site of 2013 sigmoid polypectomy was clearly                            located with aid of previous SPOT tattoo and it was                            normal.                           The exam was otherwise without abnormality on                            direct and retroflexion views. Complications:            No immediate complications. Estimated blood loss:                            None. Estimated Blood Loss:     Estimated blood loss: none. Impression:               - Three 3 to 5 mm polyps in the rectum, in the                            sigmoid colon and in the cecum, removed with a cold                            snare. Resected and retrieved.                           - The examination was otherwise normal on direct                             and retroflexion views. Recommendation:           - Patient has a contact number available for                            emergencies. The signs and symptoms of potential                            delayed complications were discussed with the                            patient. Return to normal activities tomorrow.                            Written discharge instructions were provided to the  patient.                           - Resume previous diet.                           - Continue present medications.                           You will receive a letter within 2-3 weeks with the                            pathology results and my final recommendations.                           If the polyp(s) is proven to be 'pre-cancerous' on                            pathology, you will need repeat colonoscopy in 3-5                            years. Milus Banister, MD 03/02/2018 10:48:17 AM This report has been signed electronically.

## 2018-03-05 ENCOUNTER — Telehealth: Payer: Self-pay

## 2018-03-05 NOTE — Telephone Encounter (Signed)
  Follow up Call-  Call back number 03/02/2018  Post procedure Call Back phone  # [phone]  Permission to leave phone message Yes  Some recent data might be hidden     Patient questions:  Do you have a fever, pain , or abdominal swelling? No. Pain Score  0 *  Have you tolerated food without any problems? Yes.    Have you been able to return to your normal activities? Yes.    Do you have any questions about your discharge instructions: Diet   No. Medications  No. Follow up visit  No.  Do you have questions or concerns about your Care? No.  Actions: * If pain score is 4 or above: No action needed, pain <4.

## 2018-03-05 NOTE — Telephone Encounter (Signed)
Left message for follow up on machine.

## 2018-03-06 ENCOUNTER — Ambulatory Visit (INDEPENDENT_AMBULATORY_CARE_PROVIDER_SITE_OTHER): Payer: Self-pay | Admitting: Orthopaedic Surgery

## 2018-03-07 ENCOUNTER — Encounter: Payer: Self-pay | Admitting: Gastroenterology

## 2018-03-08 ENCOUNTER — Ambulatory Visit (INDEPENDENT_AMBULATORY_CARE_PROVIDER_SITE_OTHER): Payer: BLUE CROSS/BLUE SHIELD | Admitting: Orthopaedic Surgery

## 2018-03-08 ENCOUNTER — Ambulatory Visit (INDEPENDENT_AMBULATORY_CARE_PROVIDER_SITE_OTHER): Payer: Self-pay

## 2018-03-08 ENCOUNTER — Encounter (INDEPENDENT_AMBULATORY_CARE_PROVIDER_SITE_OTHER): Payer: Self-pay | Admitting: Orthopaedic Surgery

## 2018-03-08 DIAGNOSIS — M25512 Pain in left shoulder: Secondary | ICD-10-CM | POA: Diagnosis not present

## 2018-03-08 DIAGNOSIS — M25511 Pain in right shoulder: Secondary | ICD-10-CM

## 2018-03-08 DIAGNOSIS — G8929 Other chronic pain: Secondary | ICD-10-CM

## 2018-03-08 NOTE — Progress Notes (Signed)
Office Visit Note   Patient: [name]           Date of Birth: 10-Nov-1955           MRN: [id number] Visit Date: 03/08/2018              Requested by: [name], [place], [place] [address] PCP: [name], MD   Assessment & Plan: Visit Diagnoses:  1. Chronic left shoulder pain   2. Chronic right shoulder pain     Plan: Impression is bilateral shoulder pain left greater than right.  I suspect that he either has frozen shoulder versus glenohumeral osteoarthritis.  Given the fact that has had symptoms for over a year now I do think that a left shoulder MRI is indicated to rule out structural abnormalities.  We will have him follow-up after the MRI.  Follow-Up Instructions: Return in about 2 weeks (around 03/22/2018).   Orders:  Orders Placed This Encounter  Procedures  . XR Shoulder Left  . XR Shoulder Right   No orders of the defined types were placed in this encounter.     Procedures: No procedures performed   Clinical Data: No additional findings.   Subjective: Chief Complaint  Patient presents with  . Left Shoulder - Pain  . Right Shoulder - Pain    [name] is a 62 year old gentleman who comes in with bilateral shoulder pain for over a year.  He has seen his primary care doctor for this and received a left subacromial injection which gave him 1 day of relief.  He states that for about a year he was unable to reach across his body but recently he has been able to do so with his left arm especially.  He denies any radicular symptoms or any neck pain.  He denies any injuries.  He is a poorly controlled diabetic with other medical comorbidities.   Review of Systems  Constitutional: Negative.   All other systems reviewed and are negative.    Objective: Vital Signs: There were no vitals taken for this visit.  Physical Exam  Constitutional: He is oriented to person, place, and time. He appears well-developed and well-nourished.  HENT:    Head: Normocephalic and atraumatic.  Eyes: Pupils are equal, round, and reactive to light.  Neck: Neck supple.  Pulmonary/Chest: Effort normal.  Abdominal: Soft.  Musculoskeletal: Normal range of motion.  Neurological: He is alert and oriented to person, place, and time.  Skin: Skin is warm.  Psychiatric: He has a normal mood and affect. His behavior is normal. Judgment and thought content normal.  Nursing note and vitals reviewed.   Ortho Exam Left shoulder exam shows normal active and passive range of motion with mild pain near the extremes of motion.  Rotator cuff testing is intact.  Mildly positive impingement signs. Right shoulder exam shows good rotator cuff strength.  Negative impingement signs.  Normal range of motion. Specialty Comments:  No specialty comments available.  Imaging: Xr Shoulder Left  Result Date: 03/08/2018 Mild glenohumeral osteoarthritis.  Xr Shoulder Right  Result Date: 03/08/2018 Mild osteoarthritis glenohumeral joint.  No obvious structural abnormalities.    PMFS History: Patient Active Problem List   Diagnosis Date Noted  . Hepatic cirrhosis ([place]) 12/26/2017  . Left lower quadrant pain 12/05/2017  . Change in bowel habits 12/05/2017  . Palpitations 10/03/2017  . SOB (shortness of breath) 04/12/2017  . Normal coronary arteries 09/29/2016  . Non-insulin dependent type 2 diabetes  mellitus ([place]) 09/14/2016  . Chest pain with moderate risk of acute coronary syndrome 08/28/2016  . Unstable angina ([place]) 08/26/2016  . Longstanding persistent atrial fibrillation 04/21/2015  . Deviated nasal septum 02/19/2014  . Encounter for therapeutic drug monitoring 06/20/2013  . History of colon polyps 11/22/2012  . Syncope 10/17/2012  . Acute asthmatic bronchitis 11/22/2011  . Benign neoplasm of colon 10/13/2011  . Unspecified gastritis and gastroduodenitis without mention of hemorrhage 10/13/2011  . GERD (gastroesophageal reflux disease) 10/13/2011  .  Obesity (BMI 30-39.9) 09/30/2011  . Dyslipidemia 06/23/2011  . Neuropathy 06/23/2011  . Fatigue 05/13/2011  . Rectal bleeding 04/18/2011  . Atrial flutter ([place]) 04/18/2011  . Chronic anticoagulation 11/10/2010  . PAF (paroxysmal atrial fibrillation) ([place])   . Tobacco abuse   . COPD (chronic obstructive pulmonary disease) ([place]) 02/11/2010  . Essential hypertension 01/28/2010  . OSA (obstructive sleep apnea) 01/28/2010   Past Medical History:  Diagnosis Date  . Arthritis    lower back  . Asthma   . Atrial flutter ([place])    s/p CTI ablation by Dr Rayann Heman  . Brain aneurysm 2009   questionable. A follow up CTA in 2009 showed no evidence of  . Chronic back pain    DDD/stenosis  . Colon polyps    9 polyps removed 10/13/11  . Complication of anesthesia September 11, 2012   slow to awaken after ablation  . COPD (chronic obstructive pulmonary disease) ([place])   . Diabetes mellitus    takes Metformin and Glimepiride daily  . Emphysema   . GERD (gastroesophageal reflux disease)    takes Omeprazole bid  . History of shingles   . HLD (hyperlipidemia)    takes Pravastatin daily  . HTN (hypertension)    takes Prinizide daily  . Obesity   . OSA (obstructive sleep apnea)    not always using cpap  . Overdose 2009   unintentional Flecanide overdose  . Peripheral neuropathy   . Short-term memory loss   . Tobacco abuse     Family History  Problem Relation Age of Onset  . Diabetes Mother   . Heart disease Father   . Lung cancer Father   . Diabetes Maternal Grandmother   . Colon cancer Neg Hx   . Anesthesia problems Neg Hx   . Hypotension Neg Hx   . Malignant hyperthermia Neg Hx   . Pseudochol deficiency Neg Hx     Past Surgical History:  Procedure Laterality Date  . ANTERIOR CERVICAL DECOMP/DISCECTOMY FUSION  01/04/2012   Procedure: ANTERIOR CERVICAL DECOMPRESSION/DISCECTOMY FUSION 1 LEVEL/HARDWARE REMOVAL;  Surgeon: Ophelia Charter, MD;  Location: [facility];  Service:  Neurosurgery;  Laterality: N/A;  explore cervical fusion Cervical six - seven  with removal of codman plate anterior cervical decompression with fusion interbody prothesis plating and bonegraft  . APPENDECTOMY  2-54yrs ago  . ATRIAL FIBRILLATION ABLATION N/A 09/11/2012   PT DID NOT HAVE AN ATRIAL FIBRILLATION ABLATION IN 2014!  ATRIAL FLUTTER ABLATION ONLY  . ATRIAL FLUTTER ABLATION  09/11/2012   CTI ablation by Dr Rayann Heman  . CARDIAC CATHETERIZATION  2008   no significant CAD  . CARDIOVERSION  05/05/2011   Procedure: CARDIOVERSION;  Surgeon: Lelon Perla, MD;  Location: [facility] END[facility]COPY;  Service: Cardiovascular;  Laterality: N/A;  . CARDIOVERSION Bilateral 07/26/2012   Procedure: CARDIOVERSION;  Surgeon: Minus Breeding, MD;  Location: [facility] ENDOSCOPY;  Service: Cardiovascular;  Laterality: Bilateral;  . CARPAL TUNNEL RELEASE  99/2000   bilateral  . COLONOSCOPY  WITH PROPOFOL N/A 12/13/2012   Procedure: COLONOSCOPY WITH PROPOFOL;  Surgeon: Milus Banister, MD;  Location: [facility] ENDOSCOPY;  Service: Endoscopy;  Laterality: N/A;  . ELECTROPHYSIOLOGIC STUDY N/A 04/21/2015   Procedure: Atrial Fibrillation Ablation;  Surgeon: Thompson Grayer, MD;  Location: [facility] CV LAB;  Service: Cardiovascular;  Laterality: N/A;  . HERNIA REPAIR    . KNEE SURGERY  6-25yrs ago   left  . LEFT HEART CATH AND CORONARY ANGIOGRAPHY N/A 09/19/2016   Procedure: Left Heart Cath and Coronary Angiography;  Surgeon: Belva Crome, MD;  Location: [facility];  Service: Cardiovascular;  Laterality: N/A;  . Left inguinal hernia repair     as a child  . NASAL SEPTOPLASTY W/ TURBINOPLASTY Bilateral 02/19/2014   Procedure: NASAL SEPTOPLASTY WITH BILATERAL TURBINATE REDUCTION;  Surgeon: Jodi Marble, MD;  Location: [facility];  Service: ENT;  Laterality: Bilateral;  . NECK SURGERY  3yrs ago  . right shoulder surgery  4-58yrs ago   cyst removed  . TEE WITHOUT CARDIOVERSION  05/05/2011   Procedure: TRANSESOPHAGEAL ECHOCARDIOGRAM (TEE);   Surgeon: Lelon Perla, MD;  Location: [facility] ENDOSCOPY;  Service: Cardiovascular;  Laterality: N/A;  . TEE WITHOUT CARDIOVERSION N/A 04/21/2015   Procedure: TRANSESOPHAGEAL ECHOCARDIOGRAM (TEE);  Surgeon: Sueanne Margarita, MD;  Location: [facility] ENDOSCOPY;  Service: Cardiovascular;  Laterality: N/A;  . THROAT SURGERY  4-35yrs ago   "thought " it was cancer but came back not  . TONSILLECTOMY     Social History   Occupational History  . Occupation: Dealer   Tobacco Use  . Smoking status: Current Every Day Smoker    Packs/day: 0.30    Years: 40.00    Pack years: 12.00    Types: Cigarettes  . Smokeless tobacco: Never Used  Substance and Sexual Activity  . Alcohol use: Yes    Alcohol/week: 0.0 standard drinks    Comment: 12/05/17 pt stated he has drinked 1/2 drink in 3 years  . Drug use: No  . Sexual activity: Yes

## 2018-03-08 NOTE — Addendum Note (Signed)
Addended by: Precious Bard on: 03/08/2018 11:27 AM   Modules accepted: Orders

## 2018-03-12 ENCOUNTER — Ambulatory Visit (INDEPENDENT_AMBULATORY_CARE_PROVIDER_SITE_OTHER): Payer: BLUE CROSS/BLUE SHIELD

## 2018-03-12 DIAGNOSIS — I480 Paroxysmal atrial fibrillation: Secondary | ICD-10-CM

## 2018-03-12 DIAGNOSIS — Z5181 Encounter for therapeutic drug level monitoring: Secondary | ICD-10-CM | POA: Diagnosis not present

## 2018-03-12 LAB — POCT INR: INR: 2.2 (ref 2.0–3.0)

## 2018-03-12 NOTE — Patient Instructions (Signed)
Description   Continue on same dosage 1.5 tablets of Coumadin everyday except 1 tablets on Sundays, Tuesdays and Thursdays. Recheck in 4 weeks.  Call with any new medications or scheduled for any procedures [phone]

## 2018-03-24 ENCOUNTER — Ambulatory Visit
Admission: RE | Admit: 2018-03-24 | Discharge: 2018-03-24 | Disposition: A | Discharge status: Home / Self Care | Payer: BLUE CROSS/BLUE SHIELD | Source: Ambulatory Visit | Attending: Orthopaedic Surgery | Admitting: Orthopaedic Surgery

## 2018-03-24 DIAGNOSIS — G8929 Other chronic pain: Secondary | ICD-10-CM

## 2018-03-24 DIAGNOSIS — M25512 Pain in left shoulder: Principal | ICD-10-CM

## 2018-03-26 ENCOUNTER — Telehealth (INDEPENDENT_AMBULATORY_CARE_PROVIDER_SITE_OTHER): Payer: Self-pay | Admitting: Orthopaedic Surgery

## 2018-03-26 NOTE — Telephone Encounter (Signed)
Sure he can get 5 tablets of norco

## 2018-03-26 NOTE — Telephone Encounter (Signed)
Patient called advised he have not had the MRI yet. Patient said he could lay flat because of the pain he was having. Patient asked if he can take 2 tabs of the Hydrocodone before he have the MRI? Patient have not rescheduled the MRI yet. The number to contact patient is [phone]

## 2018-03-26 NOTE — Telephone Encounter (Signed)
States he doesn't know if he will be able to lay down on his back the whole time. Is there something else he can take before MRI. He states he will take 2 Norco before.

## 2018-03-26 NOTE — Telephone Encounter (Signed)
Called advise him he can take meds prn and that he will need to call [facility] to make another MRI appt. He will need a driver if he takes pain meds.

## 2018-03-26 NOTE — Telephone Encounter (Signed)
He already has Norco

## 2018-03-27 NOTE — Telephone Encounter (Signed)
5 mg valium

## 2018-03-28 ENCOUNTER — Other Ambulatory Visit (INDEPENDENT_AMBULATORY_CARE_PROVIDER_SITE_OTHER): Payer: Self-pay

## 2018-03-28 ENCOUNTER — Encounter: Payer: BLUE CROSS/BLUE SHIELD | Attending: Family Medicine | Admitting: *Deleted

## 2018-03-28 DIAGNOSIS — E108 Type 1 diabetes mellitus with unspecified complications: Secondary | ICD-10-CM | POA: Diagnosis present

## 2018-03-28 DIAGNOSIS — E119 Type 2 diabetes mellitus without complications: Secondary | ICD-10-CM

## 2018-03-28 MED ORDER — DIAZEPAM 5 MG PO TABS: ORAL_TABLET | ORAL | 0 refills | Status: DC

## 2018-03-28 NOTE — Patient Instructions (Signed)
Plan:  Continue to aim for 3 Carb Choices per meal (45 grams) +/- 1 either way  Aim for 0-2 Carbs per snack if hungry  Include protein in moderation with your meals and snacks Consider reading food labels for Total Carbohydrate of foods Continue with your activity level daily as tolerated Continue checking BG at alternate times per day   Continue taking medication as directed by MD See if your MD is OK with you using a basal insulin such as Basaglar or Levemir to give your body some extra insulin throughout the day?

## 2018-03-28 NOTE — Telephone Encounter (Signed)
RX READY FOR PICK UP. PATIENT AWARE.

## 2018-03-29 ENCOUNTER — Ambulatory Visit (INDEPENDENT_AMBULATORY_CARE_PROVIDER_SITE_OTHER): Payer: BLUE CROSS/BLUE SHIELD | Admitting: Orthopaedic Surgery

## 2018-03-29 NOTE — Progress Notes (Signed)
Diabetes Self-Management Education  Visit Type: First/Initial  Appt. Start Time: 1400 Appt. End Time: 1500  03/29/2018  Mr. [name], identified by name and date of birth, is a 62 y.o. male with a diagnosis of Diabetes: Type 2. Patient is frustrated with very high BG's and high A1c's even though his eating habits appear appropriate. He continues to test his BG 1-2 times a day and he is active throughout the day.  ASSESSMENT  There were no vitals taken for this visit. There is no height or weight on file to calculate BMI.  Diabetes Self-Management Education - 03/28/18 1407      Visit Information   Visit Type  First/Initial      Initial Visit   Diabetes Type  Type 2    Are you currently following a meal plan?  No    Are you taking your medications as prescribed?  Yes      Health Coping   How would you rate your overall health?  Poor      Psychosocial Assessment   Patient Belief/Attitude about Diabetes  Motivated to manage diabetes    Self-care barriers  None    Self-management support  Family    Other persons present  Patient    Patient Concerns  Nutrition/Meal planning    Special Needs  None    Learning Readiness  Change in progress    What is the last grade level you completed in school?  9      Pre-Education Assessment   Patient understands the diabetes disease and treatment process.  Needs Review    Patient understands incorporating nutritional management into lifestyle.  Needs Review    Patient undertands incorporating physical activity into lifestyle.  Needs Review    Patient understands using medications safely.  Needs Instruction    Patient understands monitoring blood glucose, interpreting and using results  Needs Review    Patient understands prevention, detection, and treatment of acute complications.  Needs Review    Patient understands prevention, detection, and treatment of chronic complications.  Needs Review    Patient understands how to develop  strategies to address psychosocial issues.  Needs Review    Patient understands how to develop strategies to promote health/change behavior.  Needs Review      Complications   Last HgB A1C per patient/outside source  9.2 %    How often do you check your blood sugar?  1-2 times/day    Fasting Blood glucose range (mg/dL)  >200    Postprandial Blood glucose range (mg/dL)  >200    Number of hypoglycemic episodes per month  0    Have you had a dilated eye exam in the past 12 months?  No    Have you had a dental exam in the past 12 months?  No    Are you checking your feet?  Yes    How many days per week are you checking your feet?  1      Dietary Intake   Breakfast  sausage and eggs, occasionally 1/2 of a biscuit    Lunch  usually eats out: either fast food or sit down with vegetables on the plate - meat sandwich with fries infrequently and eats 1/2     Dinner  cooks at home 2-3 nights a week, eat out 1/week - meat, avoids starches usually, vegetables always except for green leafy vegetables due to Warfarin    Snack (evening)  poor sleeper so gets up often - PNB crackers  several times a night, occasionally sugar free ice cream before bed    Beverage(s)  1/2 and 1/2 tea OR mostly unsweet tea, water,       Exercise   Exercise Type  ADL's    How many days per week to you exercise?  0      Patient Education   Previous Diabetes Education  Yes (please comment)    Disease state   Factors that contribute to the development of diabetes    Nutrition management   Role of diet in the treatment of diabetes and the relationship between the three main macronutrients and blood glucose level;Carbohydrate counting;Food label reading, portion sizes and measuring food.;Reviewed blood glucose goals for pre and post meals and how to evaluate the patients' food intake on their blood glucose level.    Physical activity and exercise   Role of exercise on diabetes management, blood pressure control and cardiac health.     Medications  Reviewed patients medication for diabetes, action, purpose, timing of dose and side effects.   He was under the impression Victoza was insulin   Monitoring  Purpose and frequency of SMBG.;Identified appropriate SMBG and/or A1C goals.    Psychosocial adjustment  Role of stress on diabetes      Individualized Goals (developed by patient)   Nutrition  Follow meal plan discussed    Physical Activity  Exercise 3-5 times per week    Medications  take my medication as prescribed    Monitoring   test blood glucose pre and post meals as discussed      Post-Education Assessment   Patient understands the diabetes disease and treatment process.  Demonstrates understanding / competency    Patient understands incorporating nutritional management into lifestyle.  Demonstrates understanding / competency    Patient undertands incorporating physical activity into lifestyle.  Demonstrates understanding / competency    Patient understands using medications safely.  Demonstrates understanding / competency    Patient understands monitoring blood glucose, interpreting and using results  Demonstrates understanding / competency    Patient understands prevention, detection, and treatment of acute complications.  Demonstrates understanding / competency    Patient understands prevention, detection, and treatment of chronic complications.  Demonstrates understanding / competency    Patient understands how to develop strategies to address psychosocial issues.  Demonstrates understanding / competency    Patient understands how to develop strategies to promote health/change behavior.  Demonstrates understanding / competency      Outcomes   Expected Outcomes  Demonstrated interest in learning. Expect positive outcomes    Future DMSE  PRN    Program Status  Completed       Individualized Plan for Diabetes Self-Management Training:   Learning Objective:  Patient will have a greater understanding of  diabetes self-management. Patient education plan is to attend individual and/or group sessions per assessed needs and concerns.   Plan:   Patient Instructions  Plan:  Continue to aim for 3 Carb Choices per meal (45 grams) +/- 1 either way  Aim for 0-2 Carbs per snack if hungry  Include protein in moderation with your meals and snacks Consider reading food labels for Total Carbohydrate of foods Continue with your activity level daily as tolerated Continue checking BG at alternate times per day   Continue taking medication as directed by MD See if your MD is OK with you using a basal insulin such as Basaglar or Levemir to give your body some extra insulin throughout the day?  Expected  Outcomes:  Demonstrated interest in learning. Expect positive outcomes  Education material provided: Food label handouts, A1C conversion sheet, Meal plan card and Carbohydrate counting sheet, Insulin Action handout  If problems or questions, patient to contact team via:  Phone  Future DSME appointment: PRN

## 2018-04-03 ENCOUNTER — Other Ambulatory Visit: Payer: Self-pay | Admitting: Cardiology

## 2018-04-07 ENCOUNTER — Other Ambulatory Visit: Payer: Self-pay | Admitting: Cardiology

## 2018-04-07 ENCOUNTER — Ambulatory Visit
Admission: RE | Admit: 2018-04-07 | Discharge: 2018-04-07 | Disposition: A | Discharge status: Home / Self Care | Payer: BLUE CROSS/BLUE SHIELD | Source: Ambulatory Visit | Attending: Orthopaedic Surgery | Admitting: Orthopaedic Surgery

## 2018-04-09 ENCOUNTER — Ambulatory Visit (INDEPENDENT_AMBULATORY_CARE_PROVIDER_SITE_OTHER): Payer: BLUE CROSS/BLUE SHIELD | Admitting: Pharmacist

## 2018-04-09 DIAGNOSIS — Z5181 Encounter for therapeutic drug level monitoring: Secondary | ICD-10-CM | POA: Diagnosis not present

## 2018-04-09 DIAGNOSIS — I480 Paroxysmal atrial fibrillation: Secondary | ICD-10-CM | POA: Diagnosis not present

## 2018-04-09 LAB — POCT INR: INR: 2.3 (ref 2.0–3.0)

## 2018-04-09 NOTE — Patient Instructions (Signed)
Description   Continue on same dosage 1.5 tablets of Coumadin everyday except 1 tablets on Sundays, Tuesdays and Thursdays. Recheck in 6 weeks.  Call with any new medications or scheduled for any procedures [phone]

## 2018-04-10 ENCOUNTER — Encounter (INDEPENDENT_AMBULATORY_CARE_PROVIDER_SITE_OTHER): Payer: Self-pay | Admitting: Orthopaedic Surgery

## 2018-04-10 ENCOUNTER — Ambulatory Visit (INDEPENDENT_AMBULATORY_CARE_PROVIDER_SITE_OTHER): Payer: BLUE CROSS/BLUE SHIELD | Admitting: Orthopaedic Surgery

## 2018-04-10 DIAGNOSIS — Z794 Long term (current) use of insulin: Secondary | ICD-10-CM

## 2018-04-10 DIAGNOSIS — E119 Type 2 diabetes mellitus without complications: Secondary | ICD-10-CM

## 2018-04-10 DIAGNOSIS — Z01818 Encounter for other preprocedural examination: Secondary | ICD-10-CM | POA: Diagnosis not present

## 2018-04-10 NOTE — Progress Notes (Signed)
Office Visit Note   Patient: [name]           Date of Birth: [birth year]           MRN: [id number] Visit Date: 04/10/2018              Requested by: [name], [place], [place] [address] PCP: [name], MD   Assessment & Plan: Visit Diagnoses:  1. Type 2 diabetes mellitus without complication, with long-term current use of insulin ([place])   2. Preop examination     Plan: MRI of the left shoulder shows significant degenerative tearing of the labrum as well as biceps tendinopathy.  He does have tendinosis of the rotator cuff with some interstitial tearing without any full-thickness tears.  He also has AC joint arthrosis with impingement.  All these findings were discussed with the patient and treatment at this point is to proceed with arthroscopic evaluation and debridements as indicated.  I would perform a biceps tenodesis as well.  We discussed the risks and benefits and rehab and recovery today.  The patient is on warfarin and is diabetic.  We obtained a A1c level today.  We will also obtain clearances from his PCP and cardiologist prior to scheduling surgery.  Questions encouraged and answered.  We will be in touch about scheduling surgery. Total face to face encounter time was greater than 25 minutes and over half of this time was spent in counseling and/or coordination of care.  Follow-Up Instructions: Return if symptoms worsen or fail to improve.   Orders:  Orders Placed This Encounter  Procedures  . HgB A1c   No orders of the defined types were placed in this encounter.     Procedures: No procedures performed   Clinical Data: No additional findings.   Subjective: Chief Complaint  Patient presents with  . Left Shoulder - Pain    Alby follows up today for left shoulder MRI review.  He states that the subacromial injection at his PCP provided him with gave him approximately a few hours of relief during the anesthetic phase.   Review of  Systems   Objective: Vital Signs: There were no vitals taken for this visit.  Physical Exam  Ortho Exam Left shoulder exam is stable. Specialty Comments:  No specialty comments available.  Imaging: No results found.   PMFS History: Patient Active Problem List   Diagnosis Date Noted  . Hepatic cirrhosis ([place]) 12/26/2017  . Left lower quadrant pain 12/05/2017  . Change in bowel habits 12/05/2017  . Palpitations 10/03/2017  . SOB (shortness of breath) 04/12/2017  . Normal coronary arteries 09/29/2016  . Non-insulin dependent type 2 diabetes mellitus ([place]) 09/14/2016  . Chest pain with moderate risk of acute coronary syndrome 08/28/2016  . Unstable angina ([place]) 08/26/2016  . Longstanding persistent atrial fibrillation 04/21/2015  . Deviated nasal septum 02/19/2014  . Encounter for therapeutic drug monitoring 06/20/2013  . History of colon polyps 11/22/2012  . Syncope 10/17/2012  . Acute asthmatic bronchitis 11/22/2011  . Benign neoplasm of colon 10/13/2011  . Unspecified gastritis and gastroduodenitis without mention of hemorrhage 10/13/2011  . GERD (gastroesophageal reflux disease) 10/13/2011  . Obesity (BMI 30-39.9) 09/30/2011  . Dyslipidemia 06/23/2011  . Neuropathy 06/23/2011  . Fatigue 05/13/2011  . Rectal bleeding 04/18/2011  . Atrial flutter ([place]) 04/18/2011  . Chronic anticoagulation 11/10/2010  . PAF (paroxysmal atrial fibrillation) ([place])   . Tobacco abuse   . COPD (chronic obstructive pulmonary  disease) ([place]) 02/11/2010  . Essential hypertension 01/28/2010  . OSA (obstructive sleep apnea) 01/28/2010   Past Medical History:  Diagnosis Date  . Arthritis    lower back  . Asthma   . Atrial flutter ([place])    s/p CTI ablation by Dr Rayann Heman  . Brain aneurysm 2009   questionable. A follow up CTA in 2009 showed no evidence of  . Chronic back pain    DDD/stenosis  . Colon polyps    9 polyps removed 10/13/11  . Complication of anesthesia September 11, 2012   slow  to awaken after ablation  . COPD (chronic obstructive pulmonary disease) ([place])   . Diabetes mellitus    takes Metformin and Glimepiride daily  . Emphysema   . GERD (gastroesophageal reflux disease)    takes Omeprazole bid  . History of shingles   . HLD (hyperlipidemia)    takes Pravastatin daily  . HTN (hypertension)    takes Prinizide daily  . Obesity   . OSA (obstructive sleep apnea)    not always using cpap  . Overdose 2009   unintentional Flecanide overdose  . Peripheral neuropathy   . Short-term memory loss   . Tobacco abuse     Family History  Problem Relation Age of Onset  . Diabetes Mother   . Heart disease Father   . Lung cancer Father   . Diabetes Maternal Grandmother   . Colon cancer Neg Hx   . Anesthesia problems Neg Hx   . Hypotension Neg Hx   . Malignant hyperthermia Neg Hx   . Pseudochol deficiency Neg Hx     Past Surgical History:  Procedure Laterality Date  . ANTERIOR CERVICAL DECOMP/DISCECTOMY FUSION  01/04/2012   Procedure: ANTERIOR CERVICAL DECOMPRESSION/DISCECTOMY FUSION 1 LEVEL/HARDWARE REMOVAL;  Surgeon: Ophelia Charter, MD;  Location: [facility];  Service: Neurosurgery;  Laterality: N/A;  explore cervical fusion Cervical six - seven  with removal of codman plate anterior cervical decompression with fusion interbody prothesis plating and bonegraft  . APPENDECTOMY  2-61yrs ago  . ATRIAL FIBRILLATION ABLATION N/A 09/11/2012   PT DID NOT HAVE AN ATRIAL FIBRILLATION ABLATION IN 2014!  ATRIAL FLUTTER ABLATION ONLY  . ATRIAL FLUTTER ABLATION  09/11/2012   CTI ablation by Dr Rayann Heman  . CARDIAC CATHETERIZATION  2008   no significant CAD  . CARDIOVERSION  05/05/2011   Procedure: CARDIOVERSION;  Surgeon: Lelon Perla, MD;  Location: [facility] ENDOSCOPY;  Service: Cardiovascular;  Laterality: N/A;  . CARDIOVERSION Bilateral 07/26/2012   Procedure: CARDIOVERSION;  Surgeon: Minus Breeding, MD;  Location: [facility] ENDOSCOPY;  Service: Cardiovascular;  Laterality:  Bilateral;  . CARPAL TUNNEL RELEASE  99/2000   bilateral  . COLONOSCOPY WITH PROPOFOL N/A 12/13/2012   Procedure: COLONOSCOPY WITH PROPOFOL;  Surgeon: Milus Banister, MD;  Location: [facility] ENDOSCOPY;  Service: Endoscopy;  Laterality: N/A;  . ELECTROPHYSIOLOGIC STUDY N/A 04/21/2015   Procedure: Atrial Fibrillation Ablation;  Surgeon: Thompson Grayer, MD;  Location: [facility] CV LAB;  Service: Cardiovascular;  Laterality: N/A;  . HERNIA REPAIR    . KNEE SURGERY  6-47yrs ago   left  . LEFT HEART CATH AND CORONARY ANGIOGRAPHY N/A 09/19/2016   Procedure: Left Heart Cath and Coronary Angiography;  Surgeon: Belva Crome, MD;  Location: [facility];  Service: Cardiovascular;  Laterality: N/A;  . Left inguinal hernia repair     as a child  . NASAL SEPTOPLASTY W/ TURBINOPLASTY Bilateral 02/19/2014   Procedure: NASAL SEPTOPLASTY WITH BILATERAL TURBINATE  REDUCTION;  Surgeon: Jodi Marble, MD;  Location: [facility];  Service: ENT;  Laterality: Bilateral;  . NECK SURGERY  49yrs ago  . right shoulder surgery  4-4yrs ago   cyst removed  . TEE WITHOUT CARDIOVERSION  05/05/2011   Procedure: TRANSESOPHAGEAL ECHOCARDIOGRAM (TEE);  Surgeon: Lelon Perla, MD;  Location: [facility] ENDOSCOPY;  Service: Cardiovascular;  Laterality: N/A;  . TEE WITHOUT CARDIOVERSION N/A 04/21/2015   Procedure: TRANSESOPHAGEAL ECHOCARDIOGRAM (TEE);  Surgeon: Sueanne Margarita, MD;  Location: [facility] ENDOSCOPY;  Service: Cardiovascular;  Laterality: N/A;  . THROAT SURGERY  4-43yrs ago   "thought " it was cancer but came back not  . TONSILLECTOMY     Social History   Occupational History  . Occupation: Dealer   Tobacco Use  . Smoking status: Current Every Day Smoker    Packs/day: 0.30    Years: 40.00    Pack years: 12.00    Types: Cigarettes  . Smokeless tobacco: Never Used  Substance and Sexual Activity  . Alcohol use: Yes    Alcohol/week: 0.0 standard drinks    Comment: 12/05/17 pt stated he has drinked 1/2 drink in 3 years  . Drug  use: No  . Sexual activity: Yes

## 2018-04-11 ENCOUNTER — Telehealth: Payer: Self-pay | Admitting: Cardiology

## 2018-04-11 LAB — HEMOGLOBIN A1C
HEMOGLOBIN A1C: 10.1 %{Hb} — AB (ref <5.7)
Mean Plasma Glucose: 243 (calc)
eAG (mmol/L): 13.5 (calc)

## 2018-04-11 LAB — TIQ-NTM

## 2018-04-11 MED ORDER — LOSARTAN POTASSIUM 50 MG PO TABS: 50.0000 mg | ORAL_TABLET | Freq: Every day | ORAL | 3 refills | Status: DC

## 2018-04-11 NOTE — Progress Notes (Signed)
A1C is way up.  Follow up in 3 months.

## 2018-04-11 NOTE — Telephone Encounter (Signed)
REFILL SENT AS REQUESTED ./CY ?

## 2018-04-11 NOTE — Progress Notes (Signed)
I called patient and left voice mail with this information.

## 2018-04-11 NOTE — Telephone Encounter (Signed)
New Message            *STAT* If patient is at the pharmacy, call can be transferred to refill team.   1. Which medications need to be refilled? (please list name of each medication and dose if known) Losartan 50 mg  2. Which pharmacy/location (including street and city if local pharmacy) is medication to be sent to?[place]  3. Do they need a 30 day or 90 day supply? 90    Per patient the pharmacy states that they are having a hard time getting the Losartan, patient will be out of medication on Friday, need refill today if possible

## 2018-04-11 NOTE — Addendum Note (Signed)
Addended by: Devra Dopp E on: 04/11/2018 09:16 AM   Modules accepted: Orders

## 2018-05-02 ENCOUNTER — Ambulatory Visit (INDEPENDENT_AMBULATORY_CARE_PROVIDER_SITE_OTHER): Payer: BLUE CROSS/BLUE SHIELD | Admitting: Pharmacist

## 2018-05-02 DIAGNOSIS — I480 Paroxysmal atrial fibrillation: Secondary | ICD-10-CM

## 2018-05-02 DIAGNOSIS — Z5181 Encounter for therapeutic drug level monitoring: Secondary | ICD-10-CM

## 2018-05-02 LAB — POCT INR: INR: 2.1 (ref 2.0–3.0)

## 2018-05-02 NOTE — Patient Instructions (Signed)
Description   Continue on same dosage 1.5 tablets of Coumadin everyday except 1 tablets on Sundays, Tuesdays and Thursdays. Recheck in 6 weeks.  Call with any new medications or scheduled for any procedures [phone]

## 2018-05-10 ENCOUNTER — Other Ambulatory Visit: Payer: Self-pay | Admitting: Cardiology

## 2018-05-21 ENCOUNTER — Ambulatory Visit (INDEPENDENT_AMBULATORY_CARE_PROVIDER_SITE_OTHER): Payer: BLUE CROSS/BLUE SHIELD | Admitting: Pharmacist

## 2018-05-21 DIAGNOSIS — Z5181 Encounter for therapeutic drug level monitoring: Secondary | ICD-10-CM

## 2018-05-21 DIAGNOSIS — I480 Paroxysmal atrial fibrillation: Secondary | ICD-10-CM

## 2018-05-21 LAB — POCT INR: INR: 2.3 (ref 2.0–3.0)

## 2018-05-21 NOTE — Patient Instructions (Signed)
Description   Continue on same dosage 1.5 tablets of Coumadin everyday except 1 tablets on Sundays, Tuesdays and Thursdays. Recheck in 6 weeks.  Call with any new medications or scheduled for any procedures [phone]

## 2018-05-30 ENCOUNTER — Other Ambulatory Visit: Payer: Self-pay | Admitting: Cardiology

## 2018-06-01 ENCOUNTER — Ambulatory Visit (INDEPENDENT_AMBULATORY_CARE_PROVIDER_SITE_OTHER): Payer: BLUE CROSS/BLUE SHIELD | Admitting: Pulmonary Disease

## 2018-06-01 ENCOUNTER — Ambulatory Visit (INDEPENDENT_AMBULATORY_CARE_PROVIDER_SITE_OTHER): Payer: BLUE CROSS/BLUE SHIELD | Admitting: *Deleted

## 2018-06-01 ENCOUNTER — Encounter: Payer: Self-pay | Admitting: Pulmonary Disease

## 2018-06-01 DIAGNOSIS — J432 Centrilobular emphysema: Secondary | ICD-10-CM | POA: Diagnosis not present

## 2018-06-01 DIAGNOSIS — Z720 Tobacco use: Secondary | ICD-10-CM

## 2018-06-01 DIAGNOSIS — Z5181 Encounter for therapeutic drug level monitoring: Secondary | ICD-10-CM

## 2018-06-01 DIAGNOSIS — I480 Paroxysmal atrial fibrillation: Secondary | ICD-10-CM

## 2018-06-01 LAB — POCT INR: INR: 1.7 — AB (ref 2.0–3.0)

## 2018-06-01 NOTE — Patient Instructions (Signed)
Stay on Anoro Albuterol as needed Quit Smoking !!

## 2018-06-01 NOTE — Patient Instructions (Addendum)
Description   Today take 2 tablets then continue taking 1.5 tablets of Coumadin everyday except 1 tablets on Sundays, Tuesdays and Thursdays. Recheck in 2 weeks.  Call with any new medications or scheduled for any procedures [phone]

## 2018-06-01 NOTE — Progress Notes (Signed)
   Subjective:    Patient ID: [name], male    DOB: 1956-01-30, 63 y.o.   MRN: [id number]  HPI  63 yo smoker followed for COPD and obstructive sleep apnea He has chronic A. fib status post ablation in 2016 Has OSA -CPAP intolerant  Chief Complaint  Patient presents with  . Follow-up    6 month follow up for COPD. States breathing has been ok since last visit. Has been having more falls recently due to bilateral ear infections.    He continues to smoke "on and off".  He has cut down to 1 to 2 cigarettes but when he has stress episodes he can smoke up to half pack per day. He was just diagnosed with sinusitis and ear infection.  He reports multiple falls and now uses a cane. His diabetes is uncontrolled and this is holding up his left shoulder arthroscopic surgery He remains on anticoagulation for atrial fibrillation  Breathing is improved to the point where he takes his Anoro on an as-needed basis he has not needed albuterol in a few months  Significant tests/ events  Spirometry 01/2010 showed ratio of 68, FEV 1 was 72%, smaller airways 55%, no significant BD response   PSG from [facility] - severe with AHI 31.h corrected by CPAP 5 cm (wt was 250 lbs)  11/2011 Underwent neck sx >>given pre-op prednisone for asthmatic bronchitis -uneventful post op   09/2012 FEV1 67%, no BD response  Spirometry 2015 with FEV1 59%, ratio 61  Review of Systems Patient denies significant dyspnea,cough, hemoptysis,  chest pain, palpitations, pedal edema, orthopnea, paroxysmal nocturnal dyspnea, lightheadedness, nausea, vomiting, abdominal or  leg pains      Objective:   Physical Exam  Gen. Pleasant, well-nourished, in no distress ENT - no thrush, no pallor/icterus,no post nasal drip Neck: No JVD, no thyromegaly, no carotid bruits Lungs: no use of accessory muscles, no dullness to percussion, clear without rales faint exp rhonchi  Cardiovascular: Rhythm regular, heart sounds  normal, no  murmurs or gallops, no peripheral edema Musculoskeletal: No deformities, no cyanosis or clubbing        Assessment & Plan:

## 2018-06-01 NOTE — Assessment & Plan Note (Signed)
Again emphasized smoking cessation is the most important intervention here He has certainly cut down and I encouraged him to commit to another quit attempt

## 2018-06-01 NOTE — Assessment & Plan Note (Signed)
Stay on Anoro Albuterol as needed Quit Smoking !!

## 2018-06-13 ENCOUNTER — Ambulatory Visit (INDEPENDENT_AMBULATORY_CARE_PROVIDER_SITE_OTHER): Payer: BLUE CROSS/BLUE SHIELD | Admitting: *Deleted

## 2018-06-13 DIAGNOSIS — Z5181 Encounter for therapeutic drug level monitoring: Secondary | ICD-10-CM | POA: Diagnosis not present

## 2018-06-13 DIAGNOSIS — I480 Paroxysmal atrial fibrillation: Secondary | ICD-10-CM | POA: Diagnosis not present

## 2018-06-13 LAB — POCT INR: INR: 2.7 (ref 2.0–3.0)

## 2018-06-13 NOTE — Patient Instructions (Signed)
Description   Continue taking 1.5 tablets of Coumadin everyday except 1 tablets on Sundays, Tuesdays and Thursdays. Recheck in 3 weeks.  Call with any new medications or scheduled for any procedures [phone]

## 2018-06-21 DIAGNOSIS — Z9889 Other specified postprocedural states: Secondary | ICD-10-CM | POA: Insufficient documentation

## 2018-06-21 DIAGNOSIS — M5416 Radiculopathy, lumbar region: Secondary | ICD-10-CM | POA: Insufficient documentation

## 2018-06-28 ENCOUNTER — Telehealth: Payer: Self-pay

## 2018-06-28 NOTE — Telephone Encounter (Signed)
He will need to have his warfarin checked at his primary care office.  Can we confirm that somebody else will be following this.  We will not be able to prescribe the warfarin if we cannot follow it.

## 2018-06-28 NOTE — Telephone Encounter (Signed)
Pt called to cancel coumadin appt because of insurance change and states he hopes to come back next year will route this message to Dr. Percival Spanish and cancel anticoagulation episode

## 2018-06-29 NOTE — Telephone Encounter (Signed)
Call and spoke with pt he stated his insurance changes and he now at to go to [name], pt stated they now follow his Coumadin, pt stated he was very happy with [place] and he hate that his insurance made this change.

## 2018-07-17 DIAGNOSIS — M2041 Other hammer toe(s) (acquired), right foot: Secondary | ICD-10-CM | POA: Insufficient documentation

## 2018-07-17 DIAGNOSIS — B353 Tinea pedis: Secondary | ICD-10-CM | POA: Insufficient documentation

## 2018-09-04 ENCOUNTER — Other Ambulatory Visit: Payer: Self-pay | Admitting: Cardiology

## 2018-11-06 DIAGNOSIS — B351 Tinea unguium: Secondary | ICD-10-CM | POA: Insufficient documentation

## 2018-11-30 ENCOUNTER — Ambulatory Visit: Payer: BLUE CROSS/BLUE SHIELD | Admitting: Pulmonary Disease

## 2019-02-14 DIAGNOSIS — E782 Mixed hyperlipidemia: Secondary | ICD-10-CM | POA: Insufficient documentation

## 2019-03-29 DIAGNOSIS — I872 Venous insufficiency (chronic) (peripheral): Secondary | ICD-10-CM | POA: Insufficient documentation

## 2019-03-29 DIAGNOSIS — R609 Edema, unspecified: Secondary | ICD-10-CM | POA: Insufficient documentation

## 2019-03-29 DIAGNOSIS — Z79899 Other long term (current) drug therapy: Secondary | ICD-10-CM | POA: Insufficient documentation

## 2019-03-29 DIAGNOSIS — I272 Pulmonary hypertension, unspecified: Secondary | ICD-10-CM | POA: Insufficient documentation

## 2019-03-29 DIAGNOSIS — Z8639 Personal history of other endocrine, nutritional and metabolic disease: Secondary | ICD-10-CM | POA: Insufficient documentation

## 2019-03-29 DIAGNOSIS — N529 Male erectile dysfunction, unspecified: Secondary | ICD-10-CM | POA: Insufficient documentation

## 2019-04-18 DIAGNOSIS — L270 Generalized skin eruption due to drugs and medicaments taken internally: Secondary | ICD-10-CM | POA: Insufficient documentation

## 2019-04-18 LAB — HEMOGLOBIN A1C: Hemoglobin A1C: 7.9

## 2019-05-15 DIAGNOSIS — L509 Urticaria, unspecified: Secondary | ICD-10-CM | POA: Insufficient documentation

## 2019-05-18 NOTE — Progress Notes (Signed)
Cardiology Office Note   Date:  05/20/2019   ID:  [name], DOB March 09, 1956, MRN [id number]  PCP:  [name], MD  Cardiologist:   Minus Breeding, MD  Chief Complaint  Patient presents with  . Atrial Fibrillation      History of Present Illness: [name] is a 64 y.o. male who presents for who presents for follow up of atrial fibrillation and atrial flutter. He is s/p flutter ablation in 2014 and has actually done well from this standpoint. He had a cath in May 2018 that showed widely patent coronaries and normal LVF with moderate LVH. He does have significant COPD, sleep apnea (intolerant to C-pap), HTN, chronic back pain, and NIDDM.  He presents for follow up.    For a while he has not switched because of insurance and so we have seen him in over a year.  However, he is back with [place].  I do note from looking at [facility] records that he has an echocardiogram recently.  Abnormal EF.  He has mild AS.  There is moderate TR and mild mitral valve regurgitation.  He has had some chronic chest pain but this is unchanged since his catheterization in 2018.  Is mostly limited by back and joint pains.  He has neuropathy.  He does not sleep well.  He has chronic dyspnea but has not had any new shortness of breath, PND or orthopnea.  He has some palpitations but has not noticed anything that would be consistent with paroxysmal fibrillation or flutter.   Past Medical History:  Diagnosis Date  . Arthritis    lower back  . Asthma   . Atrial flutter ([place])    s/p CTI ablation by Dr Rayann Heman  . Brain aneurysm 2009   questionable. A follow up CTA in 2009 showed no evidence of  . Chronic back pain    DDD/stenosis  . Colon polyps    9 polyps removed 10/13/11  . Complication of anesthesia September 11, 2012   slow to awaken after ablation  . COPD (chronic obstructive pulmonary disease) ([place])   . Diabetes mellitus    takes Metformin and Glimepiride daily  . Emphysema   . GERD  (gastroesophageal reflux disease)    takes Omeprazole bid  . History of shingles   . HLD (hyperlipidemia)    takes Pravastatin daily  . HTN (hypertension)    takes Prinizide daily  . Obesity   . OSA (obstructive sleep apnea)    not always using cpap  . Overdose 2009   unintentional Flecanide overdose  . Peripheral neuropathy   . Short-term memory loss   . Tobacco abuse     Past Surgical History:  Procedure Laterality Date  . ANTERIOR CERVICAL DECOMP/DISCECTOMY FUSION  01/04/2012   Procedure: ANTERIOR CERVICAL DECOMPRESSION/DISCECTOMY FUSION 1 LEVEL/HARDWARE REMOVAL;  Surgeon: Ophelia Charter, MD;  Location: [facility];  Service: Neurosurgery;  Laterality: N/A;  explore cervical fusion Cervical six - seven  with removal of codman plate anterior cervical decompression with fusion interbody prothesis plating and bonegraft  . APPENDECTOMY  2-79yrs ago  . ATRIAL FIBRILLATION ABLATION N/A 09/11/2012   PT DID NOT HAVE AN ATRIAL FIBRILLATION ABLATION IN 2014!  ATRIAL FLUTTER ABLATION ONLY  . ATRIAL FLUTTER ABLATION  09/11/2012   CTI ablation by Dr Rayann Heman  . CARDIAC CATHETERIZATION  2008   no significant CAD  . CARDIOVERSION  05/05/2011   Procedure: CARDIOVERSION;  Surgeon: Lelon Perla, MD;  Location: [facility];  Service: Cardiovascular;  Laterality: N/A;  . CARDIOVERSION Bilateral 07/26/2012   Procedure: CARDIOVERSION;  Surgeon: Minus Breeding, MD;  Location: [facility];  Service: Cardiovascular;  Laterality: Bilateral;  . CARPAL TUNNEL RELEASE  99/2000   bilateral  . COLONOSCOPY WITH PROPOFOL N/A 12/13/2012   Procedure: COLONOSCOPY WITH PROPOFOL;  Surgeon: Milus Banister, MD;  Location: [facility];  Service: Endoscopy;  Laterality: N/A;  . ELECTROPHYSIOLOGIC STUDY N/A 04/21/2015   Procedure: Atrial Fibrillation Ablation;  Surgeon: Thompson Grayer, MD;  Location: [facility];  Service: Cardiovascular;  Laterality: N/A;  . HERNIA REPAIR    . KNEE SURGERY  6-40yrs ago    left  . LEFT HEART CATH AND CORONARY ANGIOGRAPHY N/A 09/19/2016   Procedure: Left Heart Cath and Coronary Angiography;  Surgeon: Belva Crome, MD;  Location: [facility];  Service: Cardiovascular;  Laterality: N/A;  . Left inguinal hernia repair     as a child  . NASAL SEPTOPLASTY W/ TURBINOPLASTY Bilateral 02/19/2014   Procedure: NASAL SEPTOPLASTY WITH BILATERAL TURBINATE REDUCTION;  Surgeon: Jodi Marble, MD;  Location: [facility];  Service: ENT;  Laterality: Bilateral;  . NECK SURGERY  70yrs ago  . right shoulder surgery  4-64yrs ago   cyst removed  . TEE WITHOUT CARDIOVERSION  05/05/2011   Procedure: TRANSESOPHAGEAL ECHOCARDIOGRAM (TEE);  Surgeon: Lelon Perla, MD;  Location[facility];  Service: Cardiovascular;  Laterality: N/A;  . TEE WITHOUT CARDIOVERSION N/A 04/21/2015   Procedure: TRANSESOPHAGEAL ECHOCARDIOGRAM (TEE);  Surgeon: Sueanne Margarita, MD;  Location: [facility];  Service: Cardiovascular;  Laterality: N/A;  . THROAT SURGERY  4-61yrs ago   "thought " it was cancer but came back not  . TONSILLECTOMY       Current Outpatient Medications  Medication Sig Dispense Refill  . albuterol (PROAIR HFA) 108 (90 Base) MCG/ACT inhaler Inhale 1-2 puffs into the lungs every 6 (six) hours as needed for wheezing or shortness of breath. 1 Inhaler 5  . bisoprolol (ZEBETA) 10 MG tablet Take 10 mg by mouth daily.    . chlorthalidone (HYGROTON) 25 MG tablet Take 25 mg by mouth daily.    . Cholecalciferol (VITAMIN D) 2000 UNITS CAPS Take 2,000 Units by mouth daily.     [name] diltiazem (CARDIZEM CD) 120 MG 24 hr capsule TAKE 1 CAPSULE BY MOUTH DAILY 30 capsule 4  . gabapentin (NEURONTIN) 100 MG capsule Take 100 mg by mouth 2 (two) times daily.    [name] glimepiride (AMARYL) 1 MG tablet Take 1 mg by mouth daily before breakfast.     . HYDROcodone-acetaminophen (NORCO) 7.5-325 MG tablet Take 1 tablet by mouth every 6 (six) hours as needed for moderate pain.    [name] insulin aspart (NOVOLOG) 100 UNIT/ML  FlexPen 30 Units 3 (three) times daily.    . Insulin Detemir (LEVEMIR FLEXTOUCH) 100 UNIT/ML Pen Inject 80 Units into the skin daily.    . Liraglutide (VICTOZA) 18 MG/3ML SOPN Inject 1.2 mg into the skin daily.     [name] losartan (COZAAR) 50 MG tablet Take 1 tablet (50 mg total) by mouth daily. 90 tablet 3  . metFORMIN (GLUCOPHAGE) 1000 MG tablet Take 1,000 mg by mouth 2 (two) times daily.     . Multiple Vitamin (MULTIVITAMIN WITH MINERALS) TABS Take 1 tablet by mouth daily.    . nitroGLYCERIN (NITROSTAT) 0.4 MG SL tablet Place 1 tablet (0.4 mg total) under the tongue every 5 (five) minutes x 3 doses as needed  for chest pain. 25 tablet 3  . omeprazole (PRILOSEC) 20 MG capsule Take 20 mg by mouth at bedtime.     . pravastatin (PRAVACHOL) 80 MG tablet TAKE 1 TABLET BY MOUTH DAILY AT BEDTIME 30 tablet 10  . sildenafil (REVATIO) 20 MG tablet Take 20-80 mg by mouth daily as needed (erectile dysfunction).    [name] umeclidinium-vilanterol (ANORO ELLIPTA) 62.5-25 MCG/INH AEPB Inhale 1 puff into the lungs daily. 1 each 3  . Vitamin D, Ergocalciferol, (DRISDOL) 1.25 MG (50000 UT) CAPS capsule Take 50,000 Units by mouth every 7 (seven) days.    [name] warfarin (COUMADIN) 5 MG tablet TAKE 1 TO 1 & 1/2 TABLETS BY MOUTH DAILYAS NEEDED AND AS DIRECTED BY THE COUMADIN CLINIC 45 tablet 4  . potassium chloride (KLOR-CON 10) 10 MEQ tablet Take 10 mEq by mouth daily.      No current facility-administered medications for this visit.    Allergies:   Adhesive [tape] and Latex    ROS:  Please see the history of present illness.   Otherwise, review of systems are positive for none.   All other systems are reviewed and negative.    PHYSICAL EXAM: VS:  BP 130/64   Pulse 73   Temp 97.9 F (36.6 C)   Ht 6\' 1"  (1.854 m)   Wt 266 lb (120.7 kg)   SpO2 96%   BMI 35.09 kg/m  , BMI Body mass index is 35.09 kg/m. GENERAL:  Well appearing NECK:  No jugular venous distention, waveform within normal limits, carotid upstroke brisk  and symmetric, no bruits, no thyromegaly LUNGS:  Clear to auscultation bilaterally CHEST:  Unremarkable HEART:  PMI not displaced or sustained,S1 and S2 within normal limits, no S3, no S4, no clicks, no rubs, 2 out of 6 apical systolic murmur radiating slightly up aortic outflow tract, no diastolic murmurs murmurs ABD:  Flat, positive bowel sounds normal in frequency in pitch, no bruits, no rebound, no guarding, no midline pulsatile mass, no hepatomegaly, no splenomegaly EXT:  2 plus pulses throughout, no edema, no cyanosis no clubbing   EKG:  EKG is ordered today. The ekg ordered today demonstrates sinus rhythm, rate 70, axis within normal limits, intervals within normal limits, nonspecific lateral T wave changes.   Recent Labs: No results found for requested labs within last 8760 hours.    Lipid Panel    Component Value Date/Time   CHOL 122 08/18/2017 0910   TRIG 400 (H) 08/18/2017 0910   HDL 29 (L) 08/18/2017 0910   CHOLHDL 4.2 08/18/2017 0910   CHOLHDL 5.3 08/27/2016 0235   VLDL 66 (H) 08/27/2016 0235   LDLCALC 13 08/18/2017 0910   LDLDIRECT 99.9 09/08/2011 0843      Wt Readings from Last 3 Encounters:  05/20/19 266 lb (120.7 kg)  06/01/18 251 lb 12.8 oz (114.2 kg)  03/02/18 250 lb (113.4 kg)      Other studies Reviewed: Additional studies/ records that were reviewed today include: Care Everywhere. Review of the above records demonstrates:  Please see elsewhere in the note.     ASSESSMENT AND PLAN:  Palpitations He is not bothersome.  Seems to be maintaining sinus rhythm.  No change in therapy.  COPD (chronic obstructive pulmonary disease) ([place]) This is followed by Dr. Elsworth Soho  Essential hypertension The blood pressure is at target. No change in medications is indicated. We will continue with therapeutic lifestyle changes (TLC).  Non-insulin dependent type 2 diabetes mellitus (HCC) A1c was 7.9.  Plan reviewed primary  care doctor. ydration".   PAF  (paroxysmal atrial fibrillation) (HCC) He tolerates anticoagulation.  Get his Coumadin level checked today and will be a little back on her Coumadin clinic.  Has had no bleeding issues.  Normal coronary arteries He has chest pain but normal coronaries.  The pain pattern is stable and atypical.  No further cardiac work-up.  This sounds musculoskeletal.   Covid Education We talked about the vaccine.  Current medicines are reviewed at length with the patient today.  The patient does not have concerns regarding medicines.  The following changes have been made:  no change  Labs/ tests ordered today include: none No orders of the defined types were placed in this encounter.    Disposition:   FU with me in one year.     Signed, Minus Breeding, MD  05/20/2019 9:45 AM    [facility]

## 2019-05-20 ENCOUNTER — Ambulatory Visit (INDEPENDENT_AMBULATORY_CARE_PROVIDER_SITE_OTHER): Payer: Self-pay | Admitting: Cardiology

## 2019-05-20 ENCOUNTER — Encounter: Payer: Self-pay | Admitting: Cardiology

## 2019-05-20 ENCOUNTER — Other Ambulatory Visit: Payer: Self-pay

## 2019-05-20 ENCOUNTER — Ambulatory Visit (INDEPENDENT_AMBULATORY_CARE_PROVIDER_SITE_OTHER): Payer: 59 | Admitting: Pharmacist Clinician (PhC)/ Clinical Pharmacy Specialist

## 2019-05-20 ENCOUNTER — Encounter (INDEPENDENT_AMBULATORY_CARE_PROVIDER_SITE_OTHER): Payer: Self-pay

## 2019-05-20 VITALS — BP 130/64 | HR 73 | Temp 97.9°F | Ht 73.0 in | Wt 266.0 lb

## 2019-05-20 DIAGNOSIS — I480 Paroxysmal atrial fibrillation: Secondary | ICD-10-CM

## 2019-05-20 DIAGNOSIS — Z7189 Other specified counseling: Secondary | ICD-10-CM

## 2019-05-20 DIAGNOSIS — Z7901 Long term (current) use of anticoagulants: Secondary | ICD-10-CM

## 2019-05-20 DIAGNOSIS — I483 Typical atrial flutter: Secondary | ICD-10-CM

## 2019-05-20 DIAGNOSIS — I10 Essential (primary) hypertension: Secondary | ICD-10-CM

## 2019-05-20 DIAGNOSIS — R002 Palpitations: Secondary | ICD-10-CM

## 2019-05-20 LAB — POCT INR: INR: 3.2 — AB (ref 2.0–3.0)

## 2019-05-20 NOTE — Patient Instructions (Addendum)
Medication Instructions:   TRY Benfotiamine OVER THE COUNTER FOR NEUROPATHY   Lab Work: NONE   Testing/Procedures: NONE  Follow-Up: At [facility], you and your health needs are our priority.  As part of our continuing mission to provide you with exceptional heart care, we have created designated Provider Care Teams.  These Care Teams include your primary Cardiologist (physician) and Advanced Practice Providers (APPs -  Physician Assistants and Nurse Practitioners) who all work together to provide you with the care you need, when you need it.  Your next appointment:   12 month(s)  The format for your next appointment:   In Person  Provider:   You may see Minus Breeding, MD or one of the following Advanced Practice Providers on your designated Care Team:    Rosaria Ferries, PA-C  Jory Sims, DNP, ANP  Cadence Kathlen Mody, NP  Other Instructions  REVIEW LIST OF PRIMARY CARE PHYSICIANS GIVEN, CALL FOR A NEW PATIENT APPOINTMENT

## 2019-05-20 NOTE — Patient Instructions (Signed)
Take 1 tablet (5 mg) today, then resume with 1.5 tablets daily.  Repeat INR in 3 weeks

## 2019-05-23 ENCOUNTER — Other Ambulatory Visit: Payer: Self-pay

## 2019-05-23 ENCOUNTER — Encounter: Payer: Self-pay | Admitting: Adult Health

## 2019-05-23 ENCOUNTER — Ambulatory Visit (INDEPENDENT_AMBULATORY_CARE_PROVIDER_SITE_OTHER): Payer: 59 | Admitting: Adult Health

## 2019-05-23 DIAGNOSIS — Z720 Tobacco use: Secondary | ICD-10-CM | POA: Diagnosis not present

## 2019-05-23 DIAGNOSIS — J449 Chronic obstructive pulmonary disease, unspecified: Secondary | ICD-10-CM

## 2019-05-23 MED ORDER — INSULIN ASPART 100 UNIT/ML FLEXPEN: 30.0000 [IU] | PEN_INJECTOR | Freq: Three times a day (TID) | SUBCUTANEOUS | 0 refills | Status: DC

## 2019-05-23 NOTE — Progress Notes (Signed)
Virtual Visit via Telephone Note  I connected with [name] on 05/23/19 at  9:00 AM EST by telephone and verified that I am speaking with the correct person using two identifiers.  Location: Patient: Home  Provider: Office    I discussed the limitations, risks, security and privacy concerns of performing an evaluation and management service by telephone and the availability of in person appointments. I also discussed with the patient that there may be a patient responsible charge related to this service. The patient expressed understanding and agreed to proceed.   History of Present Illness: 64 yo smoker followed for COPD and OSA  Medical history significant for A Fib s/p ablation in 2016 .  Has OSA , CPAP intolerant.   Today's televisit is a follow-up for COPD.  He was last seen January 2020.  Patient says his insurance changed last year and had to be seen at a different provider/[facility] Says over the last year he has been doing okay.  He remains on Anoro daily.  Says he takes it most days.  He denies any flare of cough or wheezing.  Does get winded with heavy activity.  Says he is somewhat sedentary.  Patient is continue to smoke.  But says he is planning on quit and actually has a quit date planned for later this week. We discussed the low-dose CT screening program and he is interested.  Discussed in detail smoking cessation. He denies any hemoptysis, chest pain, orthopnea, edema or unintentional weight loss.  . Patient Active Problem List   Diagnosis Date Noted  . Long term (current) use of anticoagulants 05/20/2019  . Hepatic cirrhosis ([place]) 12/26/2017  . Left lower quadrant pain 12/05/2017  . Change in bowel habits 12/05/2017  . Palpitations 10/03/2017  . SOB (shortness of breath) 04/12/2017  . Normal coronary arteries 09/29/2016  . Non-insulin dependent type 2 diabetes mellitus ([place]) 09/14/2016  . Chest pain with moderate risk of acute coronary syndrome 08/28/2016   . Unstable angina ([place]) 08/26/2016  . Longstanding persistent atrial fibrillation ([place]) 04/21/2015  . Deviated nasal septum 02/19/2014  . History of colon polyps 11/22/2012  . Syncope 10/17/2012  . Acute asthmatic bronchitis 11/22/2011  . Benign neoplasm of colon 10/13/2011  . Unspecified gastritis and gastroduodenitis without mention of hemorrhage 10/13/2011  . GERD (gastroesophageal reflux disease) 10/13/2011  . Obesity (BMI 30-39.9) 09/30/2011  . Dyslipidemia 06/23/2011  . Neuropathy 06/23/2011  . Fatigue 05/13/2011  . Rectal bleeding 04/18/2011  . Atrial flutter ([place]) 04/18/2011  . Chronic anticoagulation 11/10/2010  . PAF (paroxysmal atrial fibrillation) ([place])   . Tobacco abuse   . COPD (chronic obstructive pulmonary disease) ([place]) 02/11/2010  . Essential hypertension 01/28/2010  . OSA (obstructive sleep apnea) 01/28/2010   Current Outpatient Medications on File Prior to Visit  Medication Sig Dispense Refill  . albuterol (PROAIR HFA) 108 (90 Base) MCG/ACT inhaler Inhale 1-2 puffs into the lungs every 6 (six) hours as needed for wheezing or shortness of breath. 1 Inhaler 5  . bisoprolol (ZEBETA) 10 MG tablet Take 10 mg by mouth daily.    . chlorthalidone (HYGROTON) 25 MG tablet Take 25 mg by mouth daily.    [name] diltiazem (CARDIZEM CD) 120 MG 24 hr capsule TAKE 1 CAPSULE BY MOUTH DAILY 30 capsule 4  . gabapentin (NEURONTIN) 100 MG capsule Take 100 mg by mouth 2 (two) times daily.    [name] glimepiride (AMARYL) 1 MG tablet Take 1 mg by mouth daily before breakfast.     .  HYDROcodone-acetaminophen (NORCO) 7.5-325 MG tablet Take 1 tablet by mouth every 6 (six) hours as needed for moderate pain.    [name] insulin aspart (NOVOLOG) 100 UNIT/ML FlexPen 30 Units 3 (three) times daily.    . Insulin Detemir (LEVEMIR FLEXTOUCH) 100 UNIT/ML Pen Inject 80 Units into the skin daily.    . Liraglutide (VICTOZA) 18 MG/3ML SOPN Inject 1.2 mg into the skin daily.     [name] losartan (COZAAR) 50 MG tablet Take 1  tablet (50 mg total) by mouth daily. 90 tablet 3  . metFORMIN (GLUCOPHAGE) 1000 MG tablet Take 1,000 mg by mouth 2 (two) times daily.     . Multiple Vitamin (MULTIVITAMIN WITH MINERALS) TABS Take 1 tablet by mouth daily.    . nitroGLYCERIN (NITROSTAT) 0.4 MG SL tablet Place 1 tablet (0.4 mg total) under the tongue every 5 (five) minutes x 3 doses as needed for chest pain. 25 tablet 3  . omeprazole (PRILOSEC) 20 MG capsule Take 20 mg by mouth at bedtime.     . potassium chloride (KLOR-CON 10) 10 MEQ tablet Take 10 mEq by mouth daily.     . pravastatin (PRAVACHOL) 80 MG tablet TAKE 1 TABLET BY MOUTH DAILY AT BEDTIME (Patient taking differently: Take 80 mg by mouth daily. Take 1/2 tablet daily) 30 tablet 10  . sildenafil (REVATIO) 20 MG tablet Take 20-80 mg by mouth daily as needed (erectile dysfunction).    [name] umeclidinium-vilanterol (ANORO ELLIPTA) 62.5-25 MCG/INH AEPB Inhale 1 puff into the lungs daily. 1 each 3  . Vitamin D, Ergocalciferol, (DRISDOL) 1.25 MG (50000 UT) CAPS capsule Take 50,000 Units by mouth every 7 (seven) days.    [name] warfarin (COUMADIN) 5 MG tablet TAKE 1 TO 1 & 1/2 TABLETS BY MOUTH DAILYAS NEEDED AND AS DIRECTED BY THE COUMADIN CLINIC 45 tablet 4  . Cholecalciferol (VITAMIN D) 2000 UNITS CAPS Take 2,000 Units by mouth daily.      No current facility-administered medications on file prior to visit.      Observations/Objective:  Spirometry 01/2010 showed ratio of 68, FEV 1 was 72%, smaller airways 55%, no significant BD response   PSG from [facility] - severe with AHI 31.h corrected by CPAP 5 cm (wt was 250 lbs)  11/2011 Underwent neck sx >>given pre-op prednisone for asthmatic bronchitis -uneventful post op   09/2012 FEV1 67%, no BD response  Spirometry 2015 with FEV1 59%, ratio 61  PFTs May 2017 FEV1 65%, ratio 68, FVC 73%, positive bronchodilator response   Assessment and Plan: Moderate COPD-active smoker.  Smoking cessation was discussed.  Appears stable on  Anoro. Referral to low-dose CT screening program.  \Plan  Patient Instructions  Refer to Anselm Lis NP for the LDCT screening program.  Working on not smoking .  Continue ANORO 1 puff daily. Rinse after use.  Activity as tolerated.  Work on healthy weight loss.  Follow up with Dr. Elsworth Soho  In 3-4 months and As needed   Please contact office for sooner follow up if symptoms do not improve or worsen or seek emergency care       Follow Up Instructions: Follow-up in 3 to 4 months and as needed   I discussed the assessment and treatment plan with the patient. The patient was provided an opportunity to ask questions and all were answered. The patient agreed with the plan and demonstrated an understanding of the instructions.   The patient was advised to call back or seek an in-person evaluation if the symptoms worsen  or if the condition fails to improve as anticipated.  I provided 21 minutes of non-face-to-face time during this encounter.   Rexene Edison, NP

## 2019-05-23 NOTE — Patient Instructions (Addendum)
Refer to Anselm Lis NP for the LDCT screening program.  Working on not smoking .  Continue ANORO 1 puff daily. Rinse after use.  Activity as tolerated.  Work on healthy weight loss.  Follow up with Dr. Elsworth Soho  In 3-4 months and As needed   Please contact office for sooner follow up if symptoms do not improve or worsen or seek emergency care

## 2019-05-27 NOTE — Addendum Note (Signed)
Addended by: Parke Poisson E on: 05/27/2019 04:43 PM   Modules accepted: Orders

## 2019-06-03 ENCOUNTER — Other Ambulatory Visit: Payer: Self-pay | Admitting: *Deleted

## 2019-06-03 DIAGNOSIS — F17210 Nicotine dependence, cigarettes, uncomplicated: Secondary | ICD-10-CM

## 2019-06-06 ENCOUNTER — Other Ambulatory Visit: Payer: Self-pay

## 2019-06-10 ENCOUNTER — Other Ambulatory Visit: Payer: Self-pay

## 2019-06-10 ENCOUNTER — Ambulatory Visit (INDEPENDENT_AMBULATORY_CARE_PROVIDER_SITE_OTHER): Payer: 59 | Admitting: *Deleted

## 2019-06-10 ENCOUNTER — Ambulatory Visit (INDEPENDENT_AMBULATORY_CARE_PROVIDER_SITE_OTHER): Payer: 59 | Admitting: Endocrinology

## 2019-06-10 DIAGNOSIS — Z794 Long term (current) use of insulin: Secondary | ICD-10-CM

## 2019-06-10 DIAGNOSIS — E119 Type 2 diabetes mellitus without complications: Secondary | ICD-10-CM

## 2019-06-10 DIAGNOSIS — Z7901 Long term (current) use of anticoagulants: Secondary | ICD-10-CM

## 2019-06-10 DIAGNOSIS — I480 Paroxysmal atrial fibrillation: Secondary | ICD-10-CM | POA: Diagnosis not present

## 2019-06-10 DIAGNOSIS — I483 Typical atrial flutter: Secondary | ICD-10-CM | POA: Diagnosis not present

## 2019-06-10 LAB — POCT INR: INR: 3.6 — AB (ref 2.0–3.0)

## 2019-06-10 MED ORDER — INSULIN ASPART 100 UNIT/ML FLEXPEN: 30.0000 [IU] | PEN_INJECTOR | Freq: Three times a day (TID) | SUBCUTANEOUS | 0 refills | Status: DC

## 2019-06-10 MED ORDER — VICTOZA 18 MG/3ML ~~LOC~~ SOPN: 1.8000 mg | PEN_INJECTOR | Freq: Every day | SUBCUTANEOUS | 11 refills | Status: DC

## 2019-06-10 MED ORDER — PEN NEEDLES 30G X 8 MM MISC: 1.0000 | Freq: Every day | 1 refills | Status: DC

## 2019-06-10 MED ORDER — METFORMIN HCL ER 500 MG PO TB24: 1000.0000 mg | ORAL_TABLET | Freq: Two times a day (BID) | ORAL | 3 refills | Status: DC

## 2019-06-10 NOTE — Progress Notes (Signed)
Subjective:    Patient ID: [name], male    DOB: [birth year], 64 y.o.   MRN: [id number]  HPI pt is referred by Dr Bea Graff, for diabetes.  Pt states DM was dx'ed in 2012; he has mild neuropathy of the lower extremities; he is unaware of any associated chronic complications; he has been on insulin since 2019; pt says his diet and exercise are fair; he has never had pancreatitis, pancreatic surgery, severe hypoglycemia or DKA.  He says cbg's are in the 200's.  Pt says novolog may be causing itching throughout the body.  It does however, cause burning at the injection site.  Pt says he did not tolerate Jardiance (dehydration).  Pt says he misses the lunch Novolog.   Past Medical History:  Diagnosis Date  . Arthritis    lower back  . Asthma   . Atrial flutter ([place])    s/p CTI ablation by Dr Rayann Heman  . Brain aneurysm 2009   questionable. A follow up CTA in 2009 showed no evidence of  . Chronic back pain    DDD/stenosis  . Colon polyps    9 polyps removed 10/13/11  . Complication of anesthesia September 11, 2012   slow to awaken after ablation  . COPD (chronic obstructive pulmonary disease) ([place])   . Diabetes mellitus    takes Metformin and Glimepiride daily  . Emphysema   . GERD (gastroesophageal reflux disease)    takes Omeprazole bid  . History of shingles   . HLD (hyperlipidemia)    takes Pravastatin daily  . HTN (hypertension)    takes Prinizide daily  . Obesity   . OSA (obstructive sleep apnea)    not always using cpap  . Overdose 2009   unintentional Flecanide overdose  . Peripheral neuropathy   . Short-term memory loss   . Tobacco abuse     Past Surgical History:  Procedure Laterality Date  . ANTERIOR CERVICAL DECOMP/DISCECTOMY FUSION  01/04/2012   Procedure: ANTERIOR CERVICAL DECOMPRESSION/DISCECTOMY FUSION 1 LEVEL/HARDWARE REMOVAL;  Surgeon: Ophelia Charter, MD;  Location: [facility];  Service: Neurosurgery;  Laterality: N/A;  explore cervical fusion Cervical six -  seven  with removal of codman plate anterior cervical decompression with fusion interbody prothesis plating and bonegraft  . APPENDECTOMY  2-40yrs ago  . ATRIAL FIBRILLATION ABLATION N/A 09/11/2012   PT DID NOT HAVE AN ATRIAL FIBRILLATION ABLATION IN 2014!  ATRIAL FLUTTER ABLATION ONLY  . ATRIAL FLUTTER ABLATION  09/11/2012   CTI ablation by Dr Rayann Heman  . CARDIAC CATHETERIZATION  2008   no significant CAD  . CARDIOVERSION  05/05/2011   Procedure: CARDIOVERSION;  Surgeon: Lelon Perla, MD;  Location: [facility];  Service: Cardiovascular;  Laterality: N/A;  . CARDIOVERSION Bilateral 07/26/2012   Procedure: CARDIOVERSION;  Surgeon: Minus Breeding, MD;  Location: [facility] ENDOSCOPY;  Service: Cardiovascular;  Laterality: Bilateral;  . CARPAL TUNNEL RELEASE  99/2000   bilateral  . COLONOSCOPY WITH PROPOFOL N/A 12/13/2012   Procedure: COLONOSCOPY WITH PROPOFOL;  Surgeon: Milus Banister, MD;  Location: [facility] ENDOSCOPY;  Service: Endoscopy;  Laterality: N/A;  . ELECTROPHYSIOLOGIC STUDY N/A 04/21/2015   Procedure: Atrial Fibrillation Ablation;  Surgeon: Thompson Grayer, MD;  Location: [facility];  Service: Cardiovascular;  Laterality: N/A;  . HERNIA REPAIR    . KNEE SURGERY  6-67yrs ago   left  . LEFT HEART CATH AND CORONARY ANGIOGRAPHY N/A 09/19/2016   Procedure: Left Heart Cath and Coronary Angiography;  Surgeon: Tamala Julian,  Lynnell Dike, MD;  Location: [facility];  Service: Cardiovascular;  Laterality: N/A;  . Left inguinal hernia repair     as a child  . NASAL SEPTOPLASTY W/ TURBINOPLASTY Bilateral 02/19/2014   Procedure: NASAL SEPTOPLASTY WITH BILATERAL TURBINATE REDUCTION;  Surgeon: Jodi Marble, MD;  Location: [facility];  Service: ENT;  Laterality: Bilateral;  . NECK SURGERY  70yrs ago  . right shoulder surgery  4-40yrs ago   cyst removed  . TEE WITHOUT CARDIOVERSION  05/05/2011   Procedure: TRANSESOPHAGEAL ECHOCARDIOGRAM (TEE);  Surgeon: Lelon Perla, MD;  Location: [facility] ENDOSCOPY;  Service:  Cardiovascular;  Laterality: N/A;  . TEE WITHOUT CARDIOVERSION N/A 04/21/2015   Procedure: TRANSESOPHAGEAL ECHOCARDIOGRAM (TEE);  Surgeon: Sueanne Margarita, MD;  Location: [facility] ENDOSCOPY;  Service: Cardiovascular;  Laterality: N/A;  . THROAT SURGERY  4-67yrs ago   "thought " it was cancer but came back not  . TONSILLECTOMY      Social History   Socioeconomic History  . Marital status: Married    Spouse name: Not on file  . Number of children: 2  . Years of education: Not on file  . Highest education level: Not on file  Occupational History  . Occupation: Dealer   Tobacco Use  . Smoking status: Current Every Day Smoker    Packs/day: 0.30    Years: 40.00    Pack years: 12.00    Types: Cigarettes  . Smokeless tobacco: Never Used  Substance and Sexual Activity  . Alcohol use: Yes    Alcohol/week: 0.0 standard drinks    Comment: 12/05/17 pt stated he has drinked 1/2 drink in 3 years  . Drug use: No  . Sexual activity: Yes  Other Topics Concern  . Not on file  Social History Narrative   Daily caffeine(Mountain Dew)       Lives in [place] with spouse.   Unemployed due to chronic back/ leg pain         Social Determinants of Health   Financial Resource Strain:   . Difficulty of Paying Living Expenses: Not on file  Food Insecurity:   . Worried About Charity fundraiser in the Last Year: Not on file  . Ran Out of Food in the Last Year: Not on file  Transportation Needs:   . Lack of Transportation (Medical): Not on file  . Lack of Transportation (Non-Medical): Not on file  Physical Activity:   . Days of Exercise per Week: Not on file  . Minutes of Exercise per Session: Not on file  Stress:   . Feeling of Stress : Not on file  Social Connections:   . Frequency of Communication with Friends and Family: Not on file  . Frequency of Social Gatherings with Friends and Family: Not on file  . Attends Religious Services: Not on file  . Active Member of Clubs or  Organizations: Not on file  . Attends Archivist Meetings: Not on file  . Marital Status: Not on file  Intimate Partner Violence:   . Fear of Current or Ex-Partner: Not on file  . Emotionally Abused: Not on file  . Physically Abused: Not on file  . Sexually Abused: Not on file    Current Outpatient Medications on File Prior to Visit  Medication Sig Dispense Refill  . albuterol (PROAIR HFA) 108 (90 Base) MCG/ACT inhaler Inhale 1-2 puffs into the lungs every 6 (six) hours as needed for wheezing or shortness of breath. 1 Inhaler 5  .  bisoprolol (ZEBETA) 10 MG tablet Take 10 mg by mouth daily.    . Cholecalciferol (VITAMIN D) 2000 UNITS CAPS Take 2,000 Units by mouth daily.     [name] diltiazem (CARDIZEM CD) 120 MG 24 hr capsule TAKE 1 CAPSULE BY MOUTH DAILY 30 capsule 4  . gabapentin (NEURONTIN) 100 MG capsule Take 100 mg by mouth 2 (two) times daily.    [name] HYDROcodone-acetaminophen (NORCO) 7.5-325 MG tablet Take 1 tablet by mouth every 6 (six) hours as needed for moderate pain.    . Insulin Detemir (LEVEMIR FLEXTOUCH) 100 UNIT/ML Pen Inject 80 Units into the skin daily.    [name] losartan (COZAAR) 50 MG tablet Take 1 tablet (50 mg total) by mouth daily. 90 tablet 3  . Multiple Vitamin (MULTIVITAMIN WITH MINERALS) TABS Take 1 tablet by mouth daily.    . nitroGLYCERIN (NITROSTAT) 0.4 MG SL tablet Place 1 tablet (0.4 mg total) under the tongue every 5 (five) minutes x 3 doses as needed for chest pain. 25 tablet 3  . omeprazole (PRILOSEC) 20 MG capsule Take 20 mg by mouth at bedtime.     . potassium chloride (KLOR-CON 10) 10 MEQ tablet Take 10 mEq by mouth daily.     . pravastatin (PRAVACHOL) 80 MG tablet TAKE 1 TABLET BY MOUTH DAILY AT BEDTIME (Patient taking differently: Take 40 mg by mouth daily. ) 30 tablet 10  . sildenafil (REVATIO) 20 MG tablet Take 20-80 mg by mouth daily as needed (erectile dysfunction).    [name] umeclidinium-vilanterol (ANORO ELLIPTA) 62.5-25 MCG/INH AEPB Inhale 1 puff into  the lungs daily. 1 each 3  . Vitamin D, Ergocalciferol, (DRISDOL) 1.25 MG (50000 UT) CAPS capsule Take 50,000 Units by mouth every 7 (seven) days.    [name] warfarin (COUMADIN) 5 MG tablet TAKE 1 TO 1 & 1/2 TABLETS BY MOUTH DAILYAS NEEDED AND AS DIRECTED BY THE COUMADIN CLINIC 45 tablet 4  . chlorthalidone (HYGROTON) 25 MG tablet Take 25 mg by mouth daily.     No current facility-administered medications on file prior to visit.    Allergies  Allergen Reactions  . Adhesive [Tape]     itching  . Latex Itching    When tape is on the skin too long skin gets red & itching    Family History  Problem Relation Age of Onset  . Diabetes Mother   . Heart disease Father   . Lung cancer Father   . Diabetes Maternal Grandmother   . Colon cancer Neg Hx   . Anesthesia problems Neg Hx   . Hypotension Neg Hx   . Malignant hyperthermia Neg Hx   . Pseudochol deficiency Neg Hx     BP (!) 150/76 (BP Location: Left Arm, Patient Position: Sitting, Cuff Size: Large)   Pulse 76   Ht 6\' 1"  (1.854 m)   Wt 265 lb 6.4 oz (120.4 kg)   SpO2 100%   BMI 35.02 kg/m   Review of Systems blurry vision, headache, chest pain, n/v, urinary frequency, excessive diaphoresis, nausea, memory loss, hypoglycemia, depression, and cold intolerance. He has chronic sxs of weight gain and doe.       Objective:   Physical Exam VS: see vs page GEN: no distress HEAD: head: no deformity eyes: no periorbital swelling, no proptosis external nose and ears are normal NECK: supple, thyroid is not enlarged.  Old healed surgical scar (C-spine procedure) CHEST WALL: no deformity LUNGS: clear to auscultation except for a few wheezes CV: reg rate and rhythm, no  murmur MUSCULOSKELETAL: muscle bulk and strength are grossly normal.  no obvious joint swelling.  gait is steady with a cane.   EXTEMITIES: no deformity.  no ulcer on the feet.  feet are of normal color and temp.  1+ bilat leg edema.  Ext: there is bilateral onychomycosis of  the toenails PULSES: dorsalis pedis intact bilat.  no carotid bruit NEURO:  cn 2-12 grossly intact.   readily moves all 4's.  sensation is intact to touch on the feet, but severely decreased from normal.  SKIN:  Normal texture and temperature.  No rash or suspicious lesion is visible.  Patchy hyperpigmentation of the feet.   NODES:  None palpable at the neck PSYCH: alert, well-oriented.  Does not appear anxious nor depressed.  Lab Results  Component Value Date   CREATININE 0.85 12/05/2017   BUN 11 12/05/2017   NA 141 12/05/2017   K 3.8 12/05/2017   CL 102 12/05/2017   CO2 29 12/05/2017    Lab Results  Component Value Date   HGBA1C 7.9 04/18/2019    I have reviewed outside records, and summarized: Pt was noted to have elevated A1c, and referred here. Rash was noted, but the cause was unclear.        Assessment & Plan:  Insulin-requiring type 2 DM, new to me.  Please continue the same Novolog for now. Pt agrees to take novolog with lunch, even if he does not check cbg then.  HTN: is noted today.   Patient Instructions  Your blood pressure is high today.  Please see your primary care provider soon, to have it rechecked good diet and exercise significantly improve the control of your diabetes.  please let me know if you wish to be referred to a dietician.  high blood sugar is very risky to your health.  you should see an eye doctor and dentist every year.  It is very important to get all recommended vaccinations.  Controlling your blood pressure and cholesterol drastically reduces the damage diabetes does to your body.  Those who smoke should quit.  Please discuss these with your doctor.  check your blood sugar twice a day.  vary the time of day when you check, between before the 3 meals, and at bedtime.  also check if you have symptoms of your blood sugar being too high or too low.  please keep a record of the readings and bring it to your next appointment here (or you can bring the  meter itself).  You can write it on any piece of paper.  please call us sooner if your blood sugar goes below 70, or if you have a lot of readings over 200. It is in general higher as the day goes on.   I have sent a prescription to your pharmacy, to increase the metformin, and: Please take the novolog 3 times a day (just before each meal), and: You can stop taking the glimepiride. Please come back for a follow-up appointment in 1 month.

## 2019-06-10 NOTE — Patient Instructions (Addendum)
Description   Hold today's dose then start taking 1.5 tablets daily except 1 tablet on Fridays. Eat a serving of leafy veggies (broccoli, full serving of slaw) weekly. Repeat INR in 3 weeks. Coumadin Clinic [phone]

## 2019-06-10 NOTE — Patient Instructions (Addendum)
Your blood pressure is high today.  Please see your primary care provider soon, to have it rechecked good diet and exercise significantly improve the control of your diabetes.  please let me know if you wish to be referred to a dietician.  high blood sugar is very risky to your health.  you should see an eye doctor and dentist every year.  It is very important to get all recommended vaccinations.  Controlling your blood pressure and cholesterol drastically reduces the damage diabetes does to your body.  Those who smoke should quit.  Please discuss these with your doctor.  check your blood sugar twice a day.  vary the time of day when you check, between before the 3 meals, and at bedtime.  also check if you have symptoms of your blood sugar being too high or too low.  please keep a record of the readings and bring it to your next appointment here (or you can bring the meter itself).  You can write it on any piece of paper.  please call us sooner if your blood sugar goes below 70, or if you have a lot of readings over 200. It is in general higher as the day goes on.   I have sent a prescription to your pharmacy, to increase the metformin, and: Please take the novolog 3 times a day (just before each meal), and: You can stop taking the glimepiride. Please come back for a follow-up appointment in 1 month.

## 2019-06-12 DIAGNOSIS — E119 Type 2 diabetes mellitus without complications: Secondary | ICD-10-CM | POA: Insufficient documentation

## 2019-06-13 ENCOUNTER — Ambulatory Visit: Payer: 59 | Admitting: Internal Medicine

## 2019-06-19 ENCOUNTER — Encounter: Payer: Self-pay | Admitting: Acute Care

## 2019-06-19 ENCOUNTER — Ambulatory Visit
Admission: RE | Admit: 2019-06-19 | Discharge: 2019-06-19 | Disposition: A | Discharge status: Home / Self Care | Payer: 59 | Source: Ambulatory Visit | Attending: Acute Care | Admitting: Acute Care

## 2019-06-19 ENCOUNTER — Ambulatory Visit (INDEPENDENT_AMBULATORY_CARE_PROVIDER_SITE_OTHER): Payer: 59 | Admitting: Acute Care

## 2019-06-19 ENCOUNTER — Other Ambulatory Visit: Payer: Self-pay

## 2019-06-19 VITALS — BP 126/64 | HR 77 | Temp 97.2°F | Ht 73.0 in | Wt 264.2 lb

## 2019-06-19 DIAGNOSIS — F17210 Nicotine dependence, cigarettes, uncomplicated: Secondary | ICD-10-CM

## 2019-06-19 NOTE — Patient Instructions (Signed)
Thank you for participating in the [facility]. It was our pleasure to meet you today. We will call you with the results of your scan within the next few days. Your scan will be assigned a Lung RADS category score by the physicians reading the scans.  This Lung RADS score determines follow up scanning.  See below for description of categories, and follow up screening recommendations. We will be in touch to schedule your follow up screening annually or based on recommendations of our providers. We will fax a copy of your scan results to your Primary Care Physician, or the physician who referred you to the program, to ensure they have the results. Please call the office if you have any questions or concerns regarding your scanning experience or results.  Our office number is [phone]. Please speak with Denise Phelps, RN. She is our Lung Cancer Screening RN. If she is unavailable when you call, please have the office staff send her a message. She will return your call at her earliest convenience. Remember, if your scan is normal, we will scan you annually as long as you continue to meet the criteria for the program. (Age 55-77, Current smoker or smoker who has quit within the last 15 years). If you are a smoker, remember, quitting is the single most powerful action that you can take to decrease your risk of lung cancer and other pulmonary, breathing related problems. We know quitting is hard, and we are here to help.  Please let us know if there is anything we can do to help you meet your goal of quitting. If you are a former smoker, congratulations. We are proud of you! Remain smoke free! Remember you can refer friends or family members through the number above.  We will screen them to make sure they meet criteria for the program. Thank you for helping us take better care of you by participating in Lung Screening.  Lung RADS Categories:  Lung RADS 1: no nodules  or definitely non-concerning nodules.  Recommendation is for a repeat annual scan in 12 months.  Lung RADS 2:  nodules that are non-concerning in appearance and behavior with a very low likelihood of becoming an active cancer. Recommendation is for a repeat annual scan in 12 months.  Lung RADS 3: nodules that are probably non-concerning , includes nodules with a low likelihood of becoming an active cancer.  Recommendation is for a 6-month repeat screening scan. Often noted after an upper respiratory illness. We will be in touch to make sure you have no questions, and to schedule your 6-month scan.  Lung RADS 4 A: nodules with concerning findings, recommendation is most often for a follow up scan in 3 months or additional testing based on our provider's assessment of the scan. We will be in touch to make sure you have no questions and to schedule the recommended 3 month follow up scan.  Lung RADS 4 B:  indicates findings that are concerning. We will be in touch with you to schedule additional diagnostic testing based on our provider's  assessment of the scan.   

## 2019-06-19 NOTE — Progress Notes (Signed)
Shared Decision Making Visit Lung Cancer Screening Program [phone])   Eligibility:  Age 64 y.o.  Pack Years Smoking History Calculation 49 pack year smoking history (# packs/per year x # years smoked)  Recent History of coughing up blood  no  Unexplained weight loss? no ( >Than 15 pounds within the last 6 months )  Prior History Lung / other cancer no (Diagnosis within the last 5 years already requiring surveillance chest CT Scans).  Smoking Status Current Smoker  Former Smokers: Years since quit: NA  Quit Date: NA  Visit Components:  Discussion included one or more decision making aids. yes  Discussion included risk/benefits of screening. yes  Discussion included potential follow up diagnostic testing for abnormal scans. yes  Discussion included meaning and risk of over diagnosis. yes  Discussion included meaning and risk of False Positives. yes  Discussion included meaning of total radiation exposure. yes  Counseling Included:  Importance of adherence to annual lung cancer LDCT screening. yes  Impact of comorbidities on ability to participate in the program. yes  Ability and willingness to under diagnostic treatment. yes  Smoking Cessation Counseling:  Current Smokers:   Discussed importance of smoking cessation. yes  Information about tobacco cessation classes and interventions provided to patient. yes  Patient provided with "ticket" for LDCT Scan. yes  Symptomatic Patient. no  Counseling  Diagnosis Code: Tobacco Use Z72.0  Asymptomatic Patient yes  Counseling (Intermediate counseling: > three minutes counseling) [id number]  Former Smokers:   Discussed the importance of maintaining cigarette abstinence. yes  Diagnosis Code: Personal History of Nicotine Dependence. [id number]  Information about tobacco cessation classes and interventions provided to patient. Yes  Patient provided with "ticket" for LDCT Scan. yes  Written Order for Lung Cancer  Screening with LDCT placed in Epic. Yes (CT Chest Lung Cancer Screening Low Dose W/O CM) [id number] Z12.2-Screening of respiratory organs Z87.891-Personal history of nicotine dependence  BP 126/64 (BP Location: Left Arm, Cuff Size: Large)   Pulse 77   Temp (!) 97.2 F (36.2 C) (Temporal)   Ht 6\' 1"  (1.854 m)   Wt 264 lb 3.2 oz (119.8 kg)   SpO2 93%   BMI 34.86 kg/m    I have spent 25 minutes of face to face time with Mr Beisel discussing the risks and benefits of lung cancer screening. We viewed a power point together that explained in detail the above noted topics. We paused at intervals to allow for questions to be asked and answered to ensure understanding.We discussed that the single most powerful action that he can take to decrease his risk of developing lung cancer is to quit smoking. We discussed whether or not he is ready to commit to setting a quit date. We discussed options for tools to aid in quitting smoking including nicotine replacement therapy, non-nicotine medications, support groups, Quit Smart classes, and behavior modification. We discussed that often times setting smaller, more achievable goals, such as eliminating 1 cigarette a day for a week and then 2 cigarettes a day for a week can be helpful in slowly decreasing the number of cigarettes smoked. This allows for a sense of accomplishment as well as providing a clinical benefit. I gave him the " Be Stronger Than Your Excuses" card with contact information for community resources, classes, free nicotine replacement therapy, and access to mobile apps, text messaging, and on-line smoking cessation help. I have also given him my card and contact information in the event he needs to contact me. We discussed  the time and location of the scan, and that either Doroteo Glassman RN or I will call with the results within 24-48 hours of receiving them. I have offered him  a copy of the power point we viewed  as a resource in the event they need  reinforcement of the concepts we discussed today in the office. The patient verbalized understanding of all of  the above and had no further questions upon leaving the office. They have my contact information in the event they have any further questions.  I spent 4 minutes counseling on smoking cessation and the health risks of continued tobacco abuse.  I explained to the patient that there has been a high incidence of coronary artery disease noted on these exams. I explained that this is a non-gated exam therefore degree or severity cannot be determined. This patient is currently on statin therapy. I have asked the patient to follow-up with their PCP regarding any incidental finding of coronary artery disease and management with diet or medication as their PCP  feels is clinically indicated. The patient verbalized understanding of the above and had no further questions upon completion of the visit.  Pt. Is interested in quitting. He wants to try the nicotine replacement therapy through 1-800-Quit Now   first. If this does not work he wants to speak with a pharmacist about other options. He will call the office if he wants to see a pharmacist for smoking cessation counseling.     Magdalen Spatz, NP 06/19/2019 10:58 AM

## 2019-06-20 ENCOUNTER — Other Ambulatory Visit: Payer: Self-pay | Admitting: *Deleted

## 2019-06-20 DIAGNOSIS — F17210 Nicotine dependence, cigarettes, uncomplicated: Secondary | ICD-10-CM

## 2019-06-20 NOTE — Progress Notes (Signed)
Please call patient and let them  know their  low dose Ct was read as a Lung RADS 1, negative study: no nodules or definitely benign nodules. Radiology recommendation is for a repeat LDCT in 12 months. .Please let them  know we will order and schedule their  annual screening scan for 06/2020. Please let them  know there was notation of CAD on their  scan.  Please remind the patient  that this is a non-gated exam therefore degree or severity of disease  cannot be determined. Please have them  follow up with their PCP regarding potential risk factor modification, dietary therapy or pharmacologic therapy if clinically indicated. Pt.  is  currently on statin therapy. Please place order for annual  screening scan for  06/2020 and fax results to PCP. Thanks so much. Langley Gauss, please let him know that his scan also indicated Hepatic steatosis. This  is a term that describes the build up of fat in the liver. It is normal to have small amounts of fat in your liver, but when the proportion of liver cells that contain fat exceeds more than 5% it is indicative of early stage fatty liver.Treatment often involves reducing risk factors through a diet and exercise plan. It is generally a benign condition, but in a small percentage of patients it does require follow up. Please have the patient follow up with PCP regarding potential risk factor modification, dietary therapy or pharmacologic therapy if clinically indicated. Pt. Is followed by cardiology and had an echo 08/2016. He sees cards on a regular basis.

## 2019-06-24 ENCOUNTER — Other Ambulatory Visit: Payer: Self-pay | Admitting: Endocrinology

## 2019-06-24 DIAGNOSIS — E119 Type 2 diabetes mellitus without complications: Secondary | ICD-10-CM

## 2019-07-02 ENCOUNTER — Ambulatory Visit (INDEPENDENT_AMBULATORY_CARE_PROVIDER_SITE_OTHER): Payer: 59 | Admitting: Pharmacist

## 2019-07-02 ENCOUNTER — Other Ambulatory Visit: Payer: Self-pay

## 2019-07-02 DIAGNOSIS — Z7901 Long term (current) use of anticoagulants: Secondary | ICD-10-CM

## 2019-07-02 DIAGNOSIS — I480 Paroxysmal atrial fibrillation: Secondary | ICD-10-CM

## 2019-07-02 LAB — POCT INR: INR: 3.2 — AB (ref 2.0–3.0)

## 2019-07-02 NOTE — Patient Instructions (Signed)
Description   Take 1 tablet today, then start taking 1.5 tablets daily except 1 tablet on Mondays and Fridays. Eat a serving of leafy veggies (broccoli, full serving of slaw) weekly. Repeat INR in 3 weeks. Coumadin Clinic [phone]

## 2019-07-09 ENCOUNTER — Other Ambulatory Visit: Payer: Self-pay

## 2019-07-10 ENCOUNTER — Other Ambulatory Visit: Payer: Self-pay | Admitting: Endocrinology

## 2019-07-10 DIAGNOSIS — E119 Type 2 diabetes mellitus without complications: Secondary | ICD-10-CM

## 2019-07-11 ENCOUNTER — Encounter: Payer: Self-pay | Admitting: Endocrinology

## 2019-07-11 ENCOUNTER — Other Ambulatory Visit: Payer: Self-pay

## 2019-07-11 ENCOUNTER — Ambulatory Visit (INDEPENDENT_AMBULATORY_CARE_PROVIDER_SITE_OTHER): Payer: 59 | Admitting: Endocrinology

## 2019-07-11 ENCOUNTER — Telehealth: Payer: Self-pay | Admitting: Endocrinology

## 2019-07-11 VITALS — BP 142/70 | HR 81 | Ht 73.0 in | Wt 266.2 lb

## 2019-07-11 DIAGNOSIS — E119 Type 2 diabetes mellitus without complications: Secondary | ICD-10-CM | POA: Diagnosis not present

## 2019-07-11 DIAGNOSIS — Z794 Long term (current) use of insulin: Secondary | ICD-10-CM

## 2019-07-11 LAB — POCT GLYCOSYLATED HEMOGLOBIN (HGB A1C): Hemoglobin A1C: 8.4 % — AB (ref 4.0–5.6)

## 2019-07-11 MED ORDER — METFORMIN HCL ER 500 MG PO TB24: 500.0000 mg | ORAL_TABLET | Freq: Two times a day (BID) | ORAL | 3 refills | Status: DC

## 2019-07-11 MED ORDER — LEVEMIR FLEXTOUCH 100 UNIT/ML ~~LOC~~ SOPN: 80.0000 [IU] | PEN_INJECTOR | Freq: Every day | SUBCUTANEOUS | 11 refills | Status: DC

## 2019-07-11 MED ORDER — INSULIN ASPART FLEXPEN 100 UNIT/ML ~~LOC~~ SOPN: 50.0000 [IU] | PEN_INJECTOR | Freq: Three times a day (TID) | SUBCUTANEOUS | 11 refills | Status: DC

## 2019-07-11 NOTE — Progress Notes (Signed)
Subjective:    Patient ID: [name], male    DOB: 11/02/1955, 64 y.o.   MRN: [id number]  HPI Pt returns for f/u of diabetes mellitus: DM type: Insulin-requiring type 2 Dx'ed: 0000000 Complications: none Therapy: insulin since 2019, metformin, and Victoza DKA: never Severe hypoglycemia: never Pancreatitis: never Pancreatic imaging: normal on 2017 CT SDOH: none Other: he takes multiple daily injections; he did not tolerate Jardiance (dehydration).  Interval history: He takes extra novolog (40 units per dose), due to elevated cbg's.  Pt says he does not miss the insulin.  no cbg record, but states cbg's vary from 200-500.  He reduced metformin to 500 BID, due to abd pain.   Past Medical History:  Diagnosis Date  . Arthritis    lower back  . Asthma   . Atrial flutter ([place])    s/p CTI ablation by Dr Rayann Heman  . Brain aneurysm 2009   questionable. A follow up CTA in 2009 showed no evidence of  . Chronic back pain    DDD/stenosis  . Colon polyps    9 polyps removed 10/13/11  . Complication of anesthesia September 11, 2012   slow to awaken after ablation  . COPD (chronic obstructive pulmonary disease) ([place])   . Diabetes mellitus    takes Metformin and Glimepiride daily  . Emphysema   . GERD (gastroesophageal reflux disease)    takes Omeprazole bid  . History of shingles   . HLD (hyperlipidemia)    takes Pravastatin daily  . HTN (hypertension)    takes Prinizide daily  . Obesity   . OSA (obstructive sleep apnea)    not always using cpap  . Overdose 2009   unintentional Flecanide overdose  . Peripheral neuropathy   . Short-term memory loss   . Tobacco abuse     Past Surgical History:  Procedure Laterality Date  . ANTERIOR CERVICAL DECOMP/DISCECTOMY FUSION  01/04/2012   Procedure: ANTERIOR CERVICAL DECOMPRESSION/DISCECTOMY FUSION 1 LEVEL/HARDWARE REMOVAL;  Surgeon: Ophelia Charter, MD;  Location: [facility];  Service: Neurosurgery;  Laterality: N/A;  explore cervical  fusion Cervical six - seven  with removal of codman plate anterior cervical decompression with fusion interbody prothesis plating and bonegraft  . APPENDECTOMY  2-78yrs ago  . ATRIAL FIBRILLATION ABLATION N/A 09/11/2012   PT DID NOT HAVE AN ATRIAL FIBRILLATION ABLATION IN 2014!  ATRIAL FLUTTER ABLATION ONLY  . ATRIAL FLUTTER ABLATION  09/11/2012   CTI ablation by Dr Rayann Heman  . CARDIAC CATHETERIZATION  2008   no significant CAD  . CARDIOVERSION  05/05/2011   Procedure: CARDIOVERSION;  Surgeon: Lelon Perla, MD;  Location: [facility] ENDOSCOPY;  Service: Cardiovascular;  Laterality: N/A;  . CARDIOVERSION Bilateral 07/26/2012   Procedure: CARDIOVERSION;  Surgeon: Minus Breeding, MD;  Location: [facility] ENDOSCOPY;  Service: Cardiovascular;  Laterality: Bilateral;  . CARPAL TUNNEL RELEASE  99/2000   bilateral  . COLONOSCOPY WITH PROPOFOL N/A 12/13/2012   Procedure: COLONOSCOPY WITH PROPOFOL;  Surgeon: Milus Banister, MD;  Location: [facility] ENDOSCOPY;  Service: Endoscopy;  Laterality: N/A;  . ELECTROPHYSIOLOGIC STUDY N/A 04/21/2015   Procedure: Atrial Fibrillation Ablation;  Surgeon: Thompson Grayer, MD;  Location: [place] CV LAB;  Service: Cardiovascular;  Laterality: N/A;  . HERNIA REPAIR    . KNEE SURGERY  6-49yrs ago   left  . LEFT HEART CATH AND CORONARY ANGIOGRAPHY N/A 09/19/2016   Procedure: Left Heart Cath and Coronary Angiography;  Surgeon: Belva Crome, MD;  Location: [place] CV LAB;  Service: Cardiovascular;  Laterality: N/A;  . Left inguinal hernia repair     as a child  . NASAL SEPTOPLASTY W/ TURBINOPLASTY Bilateral 02/19/2014   Procedure: NASAL SEPTOPLASTY WITH BILATERAL TURBINATE REDUCTION;  Surgeon: Jodi Marble, MD;  Location: [facility];  Service: ENT;  Laterality: Bilateral;  . NECK SURGERY  41yrs ago  . right shoulder surgery  4-26yrs ago   cyst removed  . TEE WITHOUT CARDIOVERSION  05/05/2011   Procedure: TRANSESOPHAGEAL ECHOCARDIOGRAM (TEE);  Surgeon: Lelon Perla, MD;  Location: [facility]  ENDOSCOPY;  Service: Cardiovascular;  Laterality: N/A;  . TEE WITHOUT CARDIOVERSION N/A 04/21/2015   Procedure: TRANSESOPHAGEAL ECHOCARDIOGRAM (TEE);  Surgeon: Sueanne Margarita, MD;  Location: [facility] ENDOSCOPY;  Service: Cardiovascular;  Laterality: N/A;  . THROAT SURGERY  4-64yrs ago   "thought " it was cancer but came back not  . TONSILLECTOMY      Social History   Socioeconomic History  . Marital status: Married    Spouse name: Not on file  . Number of children: 2  . Years of education: Not on file  . Highest education level: Not on file  Occupational History  . Occupation: Dealer   Tobacco Use  . Smoking status: Current Every Day Smoker    Packs/day: 1.00    Years: 49.00    Pack years: 49.00    Types: Cigarettes  . Smokeless tobacco: Never Used  . Tobacco comment: He is going to try 1-800-QUIT Now for nicotine replacement therapy  Substance and Sexual Activity  . Alcohol use: Yes    Alcohol/week: 0.0 standard drinks    Comment: 12/05/17 pt stated he has drinked 1/2 drink in 3 years  . Drug use: No  . Sexual activity: Yes  Other Topics Concern  . Not on file  Social History Narrative   Daily caffeine(Mountain Dew)       Lives in [place] with spouse.   Unemployed due to chronic back/ leg pain         Social Determinants of Health   Financial Resource Strain:   . Difficulty of Paying Living Expenses: Not on file  Food Insecurity:   . Worried About Charity fundraiser in the Last Year: Not on file  . Ran Out of Food in the Last Year: Not on file  Transportation Needs:   . Lack of Transportation (Medical): Not on file  . Lack of Transportation (Non-Medical): Not on file  Physical Activity:   . Days of Exercise per Week: Not on file  . Minutes of Exercise per Session: Not on file  Stress:   . Feeling of Stress : Not on file  Social Connections:   . Frequency of Communication with Friends and Family: Not on file  . Frequency of Social Gatherings with Friends  and Family: Not on file  . Attends Religious Services: Not on file  . Active Member of Clubs or Organizations: Not on file  . Attends Archivist Meetings: Not on file  . Marital Status: Not on file  Intimate Partner Violence:   . Fear of Current or Ex-Partner: Not on file  . Emotionally Abused: Not on file  . Physically Abused: Not on file  . Sexually Abused: Not on file    Current Outpatient Medications on File Prior to Visit  Medication Sig Dispense Refill  . albuterol (PROAIR HFA) 108 (90 Base) MCG/ACT inhaler Inhale 1-2 puffs into the lungs every 6 (six) hours as needed for wheezing or shortness of  breath. 1 Inhaler 5  . bisoprolol (ZEBETA) 10 MG tablet Take 10 mg by mouth daily.    . chlorthalidone (HYGROTON) 25 MG tablet Take 25 mg by mouth daily.    . Cholecalciferol (VITAMIN D) 2000 UNITS CAPS Take 2,000 Units by mouth daily.     [name] diltiazem (CARDIZEM CD) 120 MG 24 hr capsule TAKE 1 CAPSULE BY MOUTH DAILY 30 capsule 4  . gabapentin (NEURONTIN) 100 MG capsule Take 100 mg by mouth 2 (two) times daily.    [name] HYDROcodone-acetaminophen (NORCO) 7.5-325 MG tablet Take 1 tablet by mouth every 6 (six) hours as needed for moderate pain.    . Insulin Pen Needle (PEN NEEDLES) 30G X 8 MM MISC 1 each by Does not apply route 5 (five) times daily. E11.9 200 each 1  . liraglutide (VICTOZA) 18 MG/3ML SOPN Inject 0.3 mLs (1.8 mg total) into the skin daily. 9 mL 11  . losartan (COZAAR) 50 MG tablet Take 1 tablet (50 mg total) by mouth daily. 90 tablet 3  . Multiple Vitamin (MULTIVITAMIN WITH MINERALS) TABS Take 1 tablet by mouth daily.    . nitroGLYCERIN (NITROSTAT) 0.4 MG SL tablet Place 1 tablet (0.4 mg total) under the tongue every 5 (five) minutes x 3 doses as needed for chest pain. 25 tablet 3  . omeprazole (PRILOSEC) 20 MG capsule Take 20 mg by mouth at bedtime.     . potassium chloride (KLOR-CON 10) 10 MEQ tablet Take 10 mEq by mouth daily.     . pravastatin (PRAVACHOL) 80 MG tablet  TAKE 1 TABLET BY MOUTH DAILY AT BEDTIME (Patient taking differently: Take 40 mg by mouth daily. ) 30 tablet 10  . sildenafil (REVATIO) 20 MG tablet Take 20-80 mg by mouth daily as needed (erectile dysfunction).    [name] umeclidinium-vilanterol (ANORO ELLIPTA) 62.5-25 MCG/INH AEPB Inhale 1 puff into the lungs daily. 1 each 3  . Vitamin D, Ergocalciferol, (DRISDOL) 1.25 MG (50000 UT) CAPS capsule Take 50,000 Units by mouth every 7 (seven) days.    [name] warfarin (COUMADIN) 5 MG tablet TAKE 1 TO 1 & 1/2 TABLETS BY MOUTH DAILYAS NEEDED AND AS DIRECTED BY THE COUMADIN CLINIC 45 tablet 4   No current facility-administered medications on file prior to visit.    Allergies  Allergen Reactions  . Adhesive [Tape]     itching  . Latex Itching    When tape is on the skin too long skin gets red & itching    Family History  Problem Relation Age of Onset  . Diabetes Mother   . Heart disease Father   . Lung cancer Father   . Diabetes Maternal Grandmother   . Colon cancer Neg Hx   . Anesthesia problems Neg Hx   . Hypotension Neg Hx   . Malignant hyperthermia Neg Hx   . Pseudochol deficiency Neg Hx     BP (!) 142/70 (BP Location: Left Arm, Patient Position: Sitting, Cuff Size: Large)   Pulse 81   Ht 6\' 1"  (1.854 m)   Wt 266 lb 3.2 oz (120.7 kg)   SpO2 94%   BMI 35.12 kg/m   Review of Systems He denies hypoglycemia    Objective:   Physical Exam VITAL SIGNS:  See vs page GENERAL: no distress Pulses: dorsalis pedis intact bilat.   MSK: no deformity of the feet CV: 1+ bilat leg edema Skin:  no ulcer on the feet, but the skin is dry.  normal color and temp on the feet.  Neuro: sensation is intact to touch on the feet, but decreased from normal.   Ext: there is bilateral onychomycosis of the toenails.    Lab Results  Component Value Date   HGBA1C 8.4 (A) 07/11/2019   Lab Results  Component Value Date   CREATININE 0.85 12/05/2017   BUN 11 12/05/2017   NA 141 12/05/2017   K 3.8 12/05/2017    CL 102 12/05/2017   CO2 29 12/05/2017       Assessment & Plan:  HTN: is noted today Insulin-requiring type 2 DM: worse: due to multiple daily injections, levemir is preferred to be taken in the evening, but he does not want to make the change now.   Edema: This limits rx options  Patient Instructions  Your blood pressure is high today.  Please see your primary care provider soon, to have it rechecked Please continue the same metformin, Levemir, and Victoza.   check your blood sugar twice a day.  vary the time of day when you check, between before the 3 meals, and at bedtime.  also check if you have symptoms of your blood sugar being too high or too low.  please keep a record of the readings and bring it to your next appointment here (or you can bring the meter itself).  You can write it on any piece of paper.  please call us sooner if your blood sugar goes below 70, or if you have a lot of readings over 200. Please increase the novolog to 50 units 3 times a day (just before each meal), and: Please come back for a follow-up appointment in 6 weeks.

## 2019-07-11 NOTE — Telephone Encounter (Signed)
Please advise 

## 2019-07-11 NOTE — Telephone Encounter (Signed)
Pt states he WAS taking Levemir 80 units in the morning but during his appt today, you advised to take 80 units at NIGHT. Please advise if you want pt to take 80 units at night OR do you still want him to take it in the morning?

## 2019-07-11 NOTE — Telephone Encounter (Signed)
OK, I have sent a prescription to your pharmacy.  For now, please continue QAM.

## 2019-07-11 NOTE — Telephone Encounter (Signed)
I would be happy to send.  However, I thought you were already taking it.  For safety, please verify that you ar taking 80 units qam.  Then I will send a refill.

## 2019-07-11 NOTE — Patient Instructions (Addendum)
Your blood pressure is high today.  Please see your primary care provider soon, to have it rechecked Please continue the same metformin, Levemir, and Victoza.   check your blood sugar twice a day.  vary the time of day when you check, between before the 3 meals, and at bedtime.  also check if you have symptoms of your blood sugar being too high or too low.  please keep a record of the readings and bring it to your next appointment here (or you can bring the meter itself).  You can write it on any piece of paper.  please call us sooner if your blood sugar goes below 70, or if you have a lot of readings over 200. Please increase the novolog to 50 units 3 times a day (just before each meal), and: Please come back for a follow-up appointment in 6 weeks.

## 2019-07-11 NOTE — Telephone Encounter (Signed)
Called pt and advised about Dr. Cordelia Pen orders to take Levemir in the morning NOT at night. Verbalized acceptance and understanding.

## 2019-07-11 NOTE — Telephone Encounter (Signed)
Patient called stating he and Dr Loanne Drilling discussed patient to start taking Levimer but patient said it was never sent to his pharmacy.   [facility] - [place] - [address] Phone:  [phone]  Fax:  [phone]

## 2019-07-11 NOTE — Telephone Encounter (Signed)
Outpatient Medication Detail   Disp Refills Start End   Insulin Detemir (LEVEMIR FLEXTOUCH) 100 UNIT/ML Pen 10 pen 11 07/11/2019    Sig - Route: Inject 80 Units into the skin daily with breakfast. - Subcutaneous   Sent to pharmacy as: Insulin Detemir (LEVEMIR FLEXTOUCH) 100 UNIT/ML Pen   E-Prescribing Status: Receipt confirmed by pharmacy (07/11/2019 10:26 AM EST)

## 2019-07-23 ENCOUNTER — Ambulatory Visit (INDEPENDENT_AMBULATORY_CARE_PROVIDER_SITE_OTHER): Payer: 59 | Admitting: *Deleted

## 2019-07-23 ENCOUNTER — Other Ambulatory Visit: Payer: Self-pay

## 2019-07-23 DIAGNOSIS — I480 Paroxysmal atrial fibrillation: Secondary | ICD-10-CM | POA: Diagnosis not present

## 2019-07-23 DIAGNOSIS — Z7901 Long term (current) use of anticoagulants: Secondary | ICD-10-CM

## 2019-07-23 LAB — POCT INR: INR: 3.2 — AB (ref 2.0–3.0)

## 2019-07-23 NOTE — Patient Instructions (Signed)
Description   Hold today and  then start taking 1.5 tablets daily except 1 tablet on Mondays, Wednesdays  and Fridays. Repeat INR in 3 weeks. [facility] [phone]

## 2019-07-26 ENCOUNTER — Telehealth: Payer: Self-pay

## 2019-07-26 ENCOUNTER — Telehealth: Payer: Self-pay | Admitting: Cardiology

## 2019-07-26 NOTE — Telephone Encounter (Signed)
Made an appt with Dr. Percival Spanish at 10:20 on 3/15. Advised pt that if he became SOB and it doesn't subside or he is having a hard time breathing to call 911. Pt verbalized understanding.

## 2019-07-26 NOTE — Telephone Encounter (Signed)
Pt c/o swelling: STAT is pt has developed SOB within 24 hours  1) How much weight have you gained and in what time span? 12 to 15 lbs in 2 weeks  2) If swelling, where is the swelling located? Hands, legs and feet  3) Are you currently taking a fluid pill? yes  4) Are you currently SOB? Yes, worse than normal  5) Do you have a log of your daily weights (if so, list)?no  6) Have you gained 3 pounds in a day or 5 pounds in a week? 12 to 15 pounds in 2 weeks  7) Have you traveled recently? No- needs to be seen and think his fluid nedss to be increased

## 2019-07-26 NOTE — Telephone Encounter (Signed)
Pt called - stating that he was gaining weight. Asked pt howe much weight he has gained. He stated that he normally is around 242 lbs on average and has gotten up to 265 lbs. Asked if the patient weights daily - denied. Educated pt on importance of daily weights. Asked pt if he was SOB - could tell he was on the phone - stated that he has COPD so he is always SOB but it has gotten worse. He also stated that he has chest pain that comes and goes and states that's d/t the "fluid". Asked pt if he has taken nitro - denied and said his dr in [place] advised him not to take it. He stated that he used to be on a fluid pill but it was d/c'd and wants to know if he can get back on it. Will forward to Dr. Percival Spanish for review.

## 2019-07-28 DIAGNOSIS — M7989 Other specified soft tissue disorders: Secondary | ICD-10-CM | POA: Insufficient documentation

## 2019-07-28 DIAGNOSIS — Z7189 Other specified counseling: Secondary | ICD-10-CM | POA: Insufficient documentation

## 2019-07-28 NOTE — Progress Notes (Addendum)
Cardiology Office Note   Date:  07/29/2019   ID:  [name], DOB 06-05-1955, MRN [id number]  PCP:  [facility]  Cardiologist:   Minus Breeding, MD  Chief Complaint  Patient presents with  . Leg Swelling      History of Present Illness: [name] is a 64 y.o. male who presents for who presents for follow up of atrial fibrillation and atrial flutter. He is s/p flutter ablation in 2014 and has actually done well from this standpoint. He had a cath in May 2018 that showed widely patent coronaries and normal LVF with moderate LVH. He does have significant COPD, sleep apnea (intolerant to C-pap), HTN, chronic back pain, and NIDDM.  He presents for follow up.    He called last week with leg swelling and increased SOB.  He said that for 6 months he has had increased swelling.  He has gained about 10 pounds since his last weight.  He has intermittent shortness of breath.  Some days are good some days are bad.  He does have chronic lung disease and still smokes.  He has sporadic palpitations lasting for a 15 to 30 seconds but this is not nearly as bad as before previous ablation.  He is describing some shortness of breath lying flat.  He slowly props himself up apparently during the night because he wakes up in a seated position.  He is not classically describing PND or orthopnea.  He does have some occasional fleeting chest discomforts.  However, he is not having any classic substernal pressure, neck or arm discomfort.  He says he tries to watch his salt.  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   Past Medical History:  Diagnosis Date  . Arthritis    lower back  . Asthma   . Atrial flutter ([place])    s/p CTI ablation by Dr Rayann Heman  . Brain aneurysm 2009   questionable. A follow up CTA in 2009 showed no evidence of  . Chronic back pain    DDD/stenosis  . Colon polyps    9 polyps removed 10/13/11  . Complication of  anesthesia September 11, 2012   slow to awaken after ablation  . COPD (chronic obstructive pulmonary disease) ([place])   . Diabetes mellitus    takes Metformin and Glimepiride daily  . Emphysema   . GERD (gastroesophageal reflux disease)    takes Omeprazole bid  . History of shingles   . HLD (hyperlipidemia)    takes Pravastatin daily  . HTN (hypertension)    takes Prinizide daily  . Obesity   . OSA (obstructive sleep apnea)    not always using cpap  . Overdose 2009   unintentional Flecanide overdose  . Peripheral neuropathy   . Short-term memory loss   . Tobacco abuse     Past Surgical History:  Procedure Laterality Date  . ANTERIOR CERVICAL DECOMP/DISCECTOMY FUSION  01/04/2012   Procedure: ANTERIOR CERVICAL DECOMPRESSION/DISCECTOMY FUSION 1 LEVEL/HARDWARE REMOVAL;  Surgeon: Ophelia Charter, MD;  Location: [place] [facility]UR[facility]S;  Service: Neurosurgery;  Laterality: N/A;  explore cervical fusion Cervical six - seven  with removal of codman plate anterior cervical decompression with fusion interbody prothesis plating and bonegraft  . APPENDECTOMY  2-30yrs ago  . ATRIAL FIBRILLATION ABLATION N/A 09/11/2012   PT DID NOT HAVE AN ATRIAL FIBRILLATION ABLATION IN 2014!  ATRIAL FLUTTER ABLATION ONLY  . ATRIAL FLUTTER ABLATION  09/11/2012   CTI ablation by Dr Rayann Heman  . CARDIAC CATHETERIZATION  2008   no significant CAD  . CARDIOVERSION  05/05/2011   Procedure: CARDIOVERSION;  Surgeon: Lelon Perla, MD;  Location: [facility] ENDOSCOPY;  Service: Cardiovascular;  Laterality: N/A;  . CARDIOVERSION Bilateral 07/26/2012   Procedure: CARDIOVERSION;  Surgeon: Minus Breeding, MD;  Location: [facility] ENDOSCOPY;  Service: Cardiovascular;  Laterality: Bilateral;  . CARPAL TUNNEL RELEASE  99/2000   bilateral  . COLONOSCOPY WITH PROPOFOL N/A 12/13/2012   Procedure: COLONOSCOPY WITH PROPOFOL;  Surgeon: Milus Banister, MD;  Location: [facility] ENDOSCOPY;  Service: Endoscopy;  Laterality: N/A;  . ELECTROPHYSIOLOGIC STUDY N/A 04/21/2015    Procedure: Atrial Fibrillation Ablation;  Surgeon: Thompson Grayer, MD;  Location: [place] CV LAB;  Service: Cardiovascular;  Laterality: N/A;  . HERNIA REPAIR    . KNEE SURGERY  6-68yrs ago   left  . LEFT HEART CATH AND CORONARY ANGIOGRAPHY N/A 09/19/2016   Procedure: Left Heart Cath and Coronary Angiography;  Surgeon: Belva Crome, MD;  Location: [place] CV LAB;  Service: Cardiovascular;  Laterality: N/A;  . Left inguinal hernia repair     as a child  . NASAL SEPTOPLASTY W/ TURBINOPLASTY Bilateral 02/19/2014   Procedure: NASAL SEPTOPLASTY WITH BILATERAL TURBINATE REDUCTION;  Surgeon: Jodi Marble, MD;  Location: [place];  Service: ENT;  Laterality: Bilateral;  . NECK SURGERY  51yrs ago  . right shoulder surgery  4-7yrs ago   cyst removed  . TEE WITHOUT CARDIOVERSION  05/05/2011   Procedure: TRANSESOPHAGEAL ECHOCARDIOGRAM (TEE);  Surgeon: Lelon Perla, MD;  Location: [facility] ENDOSCOPY;  Service: Cardiovascular;  Laterality: N/A;  . TEE WITHOUT CARDIOVERSION N/A 04/21/2015   Procedure: TRANSESOPHAGEAL ECHOCARDIOGRAM (TEE);  Surgeon: Sueanne Margarita, MD;  Location: [facility] ENDOSCOPY;  Service: Cardiovascular;  Laterality: N/A;  . THROAT SURGERY  4-48yrs ago   "thought " it was cancer but came back not  . TONSILLECTOMY       Current Outpatient Medications  Medication Sig Dispense Refill  . albuterol (PROAIR HFA) 108 (90 Base) MCG/ACT inhaler Inhale 1-2 puffs into the lungs every 6 (six) hours as needed for wheezing or shortness of breath. 1 Inhaler 5  . bisoprolol (ZEBETA) 10 MG tablet Take 10 mg by mouth daily.    . chlorthalidone (HYGROTON) 25 MG tablet Take 25 mg by mouth daily.    . Cholecalciferol (VITAMIN D) 2000 UNITS CAPS Take 2,000 Units by mouth daily.     [name] diltiazem (CARDIZEM CD) 120 MG 24 hr capsule TAKE 1 CAPSULE BY MOUTH DAILY 30 capsule 4  . gabapentin (NEURONTIN) 100 MG capsule Take 100 mg by mouth 2 (two) times daily.    [name] HYDROcodone-acetaminophen (NORCO) 7.5-325 MG tablet  Take 1 tablet by mouth every 6 (six) hours as needed for moderate pain.    . Insulin Aspart FlexPen 100 UNIT/ML SOPN Inject 50 Units into the skin 3 (three) times daily with meals. 55 pen 11  . Insulin Detemir (LEVEMIR FLEXTOUCH) 100 UNIT/ML Pen Inject 80 Units into the skin daily with breakfast. 10 pen 11  . Insulin Pen Needle (PEN NEEDLES) 30G X 8 MM MISC 1 each by Does not apply route 5 (five) times daily. E11.9 200 each 1  . liraglutide (VICTOZA) 18 MG/3ML SOPN Inject 0.3 mLs (1.8 mg total) into the skin daily. 9 mL 11  . losartan (COZAAR) 50 MG tablet Take 1 tablet (50 mg total) by mouth daily. 90 tablet 3  . metFORMIN (GLUCOPHAGE-XR)  500 MG 24 hr tablet Take 1 tablet (500 mg total) by mouth 2 (two) times daily. 180 tablet 3  . Multiple Vitamin (MULTIVITAMIN WITH MINERALS) TABS Take 1 tablet by mouth daily.    . nitroGLYCERIN (NITROSTAT) 0.4 MG SL tablet Place 1 tablet (0.4 mg total) under the tongue every 5 (five) minutes x 3 doses as needed for chest pain. 25 tablet 3  . omeprazole (PRILOSEC) 20 MG capsule Take 20 mg by mouth at bedtime.     . potassium chloride (KLOR-CON 10) 10 MEQ tablet Take 10 mEq by mouth daily.     . pravastatin (PRAVACHOL) 80 MG tablet TAKE 1 TABLET BY MOUTH DAILY AT BEDTIME (Patient taking differently: Take 40 mg by mouth daily. ) 30 tablet 10  . sildenafil (REVATIO) 20 MG tablet Take 20-80 mg by mouth daily as needed (erectile dysfunction).    [name] umeclidinium-vilanterol (ANORO ELLIPTA) 62.5-25 MCG/INH AEPB Inhale 1 puff into the lungs daily. 1 each 3  . Vitamin D, Ergocalciferol, (DRISDOL) 1.25 MG (50000 UT) CAPS capsule Take 50,000 Units by mouth every 7 (seven) days.    [name] warfarin (COUMADIN) 5 MG tablet TAKE 1 TO 1 & 1/2 TABLETS BY MOUTH DAILYAS NEEDED AND AS DIRECTED BY THE COUMADIN CLINIC 45 tablet 4  . furosemide (LASIX) 40 MG tablet Take 1 tablet (40 mg total) by mouth daily. Take 1 tablet daily for 5 days 30 tablet 0   No current facility-administered  medications for this visit.    Allergies:   Adhesive [tape] and Latex    ROS:  Please see the history of present illness.   Otherwise, review of systems are positive for neuropathy, back pain.   All other systems are reviewed and negative.    PHYSICAL EXAM: VS:  BP (!) 158/72   Pulse 73   Ht 6\' 3"  (1.905 m)   Wt 273 lb (123.8 kg)   BMI 34.12 kg/m  , BMI Body mass index is 34.12 kg/m. GENERAL:  Well appearing NECK: Mild jugular venous distention, waveform within normal limits, carotid upstroke brisk and symmetric, no bruits, no thyromegaly LUNGS: Scattered wheezing and diffuse rhonchi. CHEST:  Unremarkable HEART:  PMI not displaced or sustained,S1 and S2 within normal limits, no S3, no S4, no clicks, no rubs, 2 out of 6 apical murmur heard best throughout the precordium, no diastolic murmurs ABD:  Flat, positive bowel sounds normal in frequency in pitch, no bruits, no rebound, no guarding, no midline pulsatile mass, no hepatomegaly, no splenomegaly EXT:  2 plus pulses throughout, moderate bilateral lower extremity edema, no cyanosis no clubbing    EKG:  EKG is  ordered today. The ekg ordered today demonstrates sinus rhythm, rate 73, axis within normal limits, intervals within normal limits, nonspecific lateral T wave changes.   Recent Labs: No results found for requested labs within last 8760 hours.    Lipid Panel    Component Value Date/Time   CHOL 122 08/18/2017 0910   TRIG 400 (H) 08/18/2017 0910   HDL 29 (L) 08/18/2017 0910   CHOLHDL 4.2 08/18/2017 0910   CHOLHDL 5.3 08/27/2016 0235   VLDL 66 (H) 08/27/2016 0235   LDLCALC 13 08/18/2017 0910   LDLDIRECT 99.9 09/08/2011 0843      Wt Readings from Last 3 Encounters:  07/29/19 273 lb (123.8 kg)  07/11/19 266 lb 3.2 oz (120.7 kg)  06/19/19 264 lb 3.2 oz (119.8 kg)      Other studies Reviewed: Additional studies/ records that were reviewed  today include: Care Everywhere labs Review of the above records  demonstrates:  Please see elsewhere in the note.     ASSESSMENT AND PLAN:   Edema He might have some right ventricular failure.  Of note I did see a CT that was done just for cancer screening and there was mention of him having dilated pulmonary arteries.  I will check an echocardiogram.  Today I am going to give him Lasix 40 mg daily.  I will check a BNP level, basic metabolic profile and a TSH.  I did see these results from December minus the BNP and they were essentially normal.  I would like him to come back in about a week and get another basic metabolic profile and to see one of our APP's.  He is going to get a nutrition consult as well for his sodium intake.   Palpitations These are short-lived and no change in therapy.   COPD (chronic obstructive pulmonary disease) ([place]) This is followed by Dr. Elsworth Soho and he understands need to stop smoking.  This certainly contributes to some of his breathlessness.   Essential hypertension The blood pressure is elevated today.  However, this is somewhat unusual.  He needs to keep a blood pressure diary  Non-insulin dependent type 2 diabetes mellitus (HCC) A1c was 8.2 which is up a little bit from 7.9.  I will defer to his primary practice.   PAF (paroxysmal atrial fibrillation) (HCC) He tolerates anticoagulation.  No change in therapy.   Normal coronary arteries Of note he had normal coronaries in 2018 despite having coronary calcification noted on the CT this time.  I do not strongly suspect ischemia .    Covid Education He is going to sign up for the vaccine.   Current medicines are reviewed at length with the patient today.  The patient does not have concerns regarding medicines.  The following changes have been made:  no change  Labs/ tests ordered today include: none  Orders Placed This Encounter  Procedures  . Brain natriuretic peptide  . Basic metabolic panel  . TSH  . Basic metabolic panel  . EKG 12-Lead  . ECHOCARDIOGRAM  COMPLETE     Disposition:   FU with APP in one week.     Signed, Minus Breeding, MD  07/29/2019 10:58 AM    [facility]

## 2019-07-29 ENCOUNTER — Other Ambulatory Visit: Payer: Self-pay

## 2019-07-29 ENCOUNTER — Encounter: Payer: Self-pay | Admitting: Cardiology

## 2019-07-29 ENCOUNTER — Ambulatory Visit (INDEPENDENT_AMBULATORY_CARE_PROVIDER_SITE_OTHER): Payer: 59 | Admitting: Cardiology

## 2019-07-29 VITALS — BP 158/72 | HR 73 | Ht 75.0 in | Wt 273.0 lb

## 2019-07-29 DIAGNOSIS — I480 Paroxysmal atrial fibrillation: Secondary | ICD-10-CM | POA: Diagnosis not present

## 2019-07-29 DIAGNOSIS — M7989 Other specified soft tissue disorders: Secondary | ICD-10-CM | POA: Diagnosis not present

## 2019-07-29 DIAGNOSIS — Z7189 Other specified counseling: Secondary | ICD-10-CM

## 2019-07-29 DIAGNOSIS — R0602 Shortness of breath: Secondary | ICD-10-CM | POA: Diagnosis not present

## 2019-07-29 DIAGNOSIS — R002 Palpitations: Secondary | ICD-10-CM

## 2019-07-29 DIAGNOSIS — I10 Essential (primary) hypertension: Secondary | ICD-10-CM | POA: Diagnosis not present

## 2019-07-29 MED ORDER — FUROSEMIDE 40 MG PO TABS: 40.0000 mg | ORAL_TABLET | Freq: Every day | ORAL | 0 refills | Status: DC

## 2019-07-29 NOTE — Patient Instructions (Addendum)
Medication Instructions:  Take 1 tablet of furosemide 40mg  daily for 5 days If you need a refill on your cardiac medications before your next appointment, please call your pharmacy.  Labwork: Your physician recommends that you return for lab work today (BNP, BMP, TSH)  Your physician recommends that you return for lab work in one week (BMP)  Testing/Procedures: Your physician has requested that you have an echocardiogram. Echocardiography is a painless test that uses sound waves to create images of your heart. It provides your doctor with information about the size and shape of your heart and how well your heart's chambers and valves are working. This procedure takes approximately one hour. There are no restrictions for this procedure. [place]  Follow-Up: 1 week In person Any available APP   At [facility], you and your health needs are our priority.  As part of our continuing mission to provide you with exceptional heart care, we have created designated Provider Care Teams.  These Care Teams include your primary Cardiologist (physician) and Advanced Practice Providers (APPs -  Physician Assistants and Nurse Practitioners) who all work together to provide you with the care you need, when you need it.  Other Instructions: Keep a blood pressure diary twice a day

## 2019-07-30 ENCOUNTER — Other Ambulatory Visit (HOSPITAL_COMMUNITY): Payer: 59

## 2019-07-30 LAB — BASIC METABOLIC PANEL
BUN/Creatinine Ratio: 20 (ref 10–24)
BUN: 16 mg/dL (ref 8–27)
CO2: 25 mmol/L (ref 20–29)
Calcium: 9 mg/dL (ref 8.6–10.2)
Chloride: 104 mmol/L (ref 96–106)
Creatinine, Ser: 0.81 mg/dL (ref 0.76–1.27)
GFR calc Af Amer: 111 mL/min/{1.73_m2} (ref >59)
GFR calc non Af Amer: 96 mL/min/{1.73_m2} (ref >59)
Glucose: 225 mg/dL — ABNORMAL HIGH (ref 65–99)
Potassium: 3.9 mmol/L (ref 3.5–5.2)
Sodium: 143 mmol/L (ref 134–144)

## 2019-07-30 LAB — BRAIN NATRIURETIC PEPTIDE: BNP: 102.1 pg/mL — ABNORMAL HIGH (ref 0.0–100.0)

## 2019-07-30 LAB — TSH: TSH: 4.33 u[IU]/mL (ref 0.450–4.500)

## 2019-08-05 NOTE — Progress Notes (Deleted)
Cardiology Office Note   Date:  08/05/2019   ID:  DEAUNDRA [name], DOB 08-10-1955, MRN [id number]  PCP:  [facility]  Cardiologist:   Minus Breeding, MD  No chief complaint on file.     History of Present Illness: [name] is a 64 y.o. male who presents for who presents for follow up of atrial fibrillation and atrial flutter. He follows with Dr. Percival Spanish.  He is s/p flutter ablation in 2014 and has actually done well from this standpoint. He had a cath in May 2018 that showed widely patent coronaries and normal LVF with moderate LVH. He does have significant COPD, sleep apnea (intolerant to C-pap), HTN, chronic back pain, and NIDDM.  He was seen by Dr. Percival Spanish on 07/29/2019 and reported worsening lower extremity edema and shortness of breath.  He was started on Lasix 40 mg daily.  Labs on 07/29/2019 notable for normal renal function (creatinine 0.8), BNP 102, TSH 4.3.  TTE was ordered.  He might have some right ventricular failure.  Of note I did see a CT that was done just for cancer screening and there was mention of him having dilated pulmonary arteries.  I will check an echocardiogram.  Today I am going to give him Lasix 40 mg daily.  I will check a BNP level, basic metabolic profile and a TSH.  I did see these results from December minus the BNP and they were essentially normal.  I would like him to come back in about a week and get another basic metabolic profile and to see one of our APP's.  He is going to get a nutrition consult as well for his sodium intake.     Past Medical History:  Diagnosis Date  . Arthritis    lower back  . Asthma   . Atrial flutter ([place])    s/p CTI ablation by Dr Rayann Heman  . Brain aneurysm 2009   questionable. A follow up CTA in 2009 showed no evidence of  . Chronic back pain    DDD/stenosis  . Colon polyps    9 polyps removed 10/13/11  . Complication of anesthesia September 11, 2012   slow to awaken after ablation  . COPD (chronic  obstructive pulmonary disease) ([place])   . Diabetes mellitus    takes Metformin and Glimepiride daily  . Emphysema   . GERD (gastroesophageal reflux disease)    takes Omeprazole bid  . History of shingles   . HLD (hyperlipidemia)    takes Pravastatin daily  . HTN (hypertension)    takes Prinizide daily  . Obesity   . OSA (obstructive sleep apnea)    not always using cpap  . Overdose 2009   unintentional Flecanide overdose  . Peripheral neuropathy   . Short-term memory loss   . Tobacco abuse     Past Surgical History:  Procedure Laterality Date  . ANTERIOR CERVICAL DECOMP/DISCECTOMY FUSION  01/04/2012   Procedure: ANTERIOR CERVICAL DECOMPRESSION/DISCECTOMY FUSION 1 LEVEL/HARDWARE REMOVAL;  Surgeon: Ophelia Charter, MD;  Location: [place] [facility]URO ORS;  Service: Neurosurgery;  Laterality: N/A;  explore cervical fusion Cervical six - seven  with removal of codman plate anterior cervical decompression with fusion interbody prothesis plating and bonegraft  . APPENDECTOMY  2-69yrs ago  . ATRIAL FIBRILLATION ABLATION N/A 09/11/2012   PT DID NOT HAVE AN ATRIAL FIBRILLATION ABLATION IN 2014!  ATRIAL FLUTTER ABLATION ONLY  . ATRIAL FLUTTER ABLATION  09/11/2012   CTI ablation by Dr  Allred  . CARDIAC CATHETERIZATION  2008   no significant CAD  . CARDIOVERSION  05/05/2011   Procedure: CARDIOVERSION;  Surgeon: Lelon Perla, MD;  Location: [facility] ENDOSCOPY;  Service: Cardiovascular;  Laterality: N/A;  . CARDIOVERSION Bilateral 07/26/2012   Procedure: CARDIOVERSION;  Surgeon: Minus Breeding, MD;  Location[facility] ENDOSCOPY;  Service: Cardiovascular;  Laterality: Bilateral;  . CARPAL TUNNEL RELEASE  99/2000   bilateral  . COLONOSCOPY WITH PROPOFOL N/A 12/13/2012   Procedure: COLONOSCOPY WITH PROPOFOL;  Surgeon: Milus Banister, MD;  Location: [facility] ENDOSCOPY;  Service: Endoscopy;  Laterality: N/A;  . ELECTROPHYSIOLOGIC STUDY N/A 04/21/2015   Procedure: Atrial Fibrillation Ablation;  Surgeon: Thompson Grayer, MD;   Location: [place] CV LAB;  Service: Cardiovascular;  Laterality: N/A;  . HERNIA REPAIR    . KNEE SURGERY  6-3yrs ago   left  . LEFT HEART CATH AND CORONARY ANGIOGRAPHY N/A 09/19/2016   Procedure: Left Heart Cath and Coronary Angiography;  Surgeon: Belva Crome, MD;  Location: [place] CV LAB;  Service: Cardiovascular;  Laterality: N/A;  . Left inguinal hernia repair     as a child  . NASAL SEPTOPLASTY W/ TURBINOPLASTY Bilateral 02/19/2014   Procedure: NASAL SEPTOPLASTY WITH BILATERAL TURBINATE REDUCTION;  Surgeon: Jodi Marble, MD;  Location: [place];  Service: ENT;  Laterality: Bilateral;  . NECK SURGERY  38yrs ago  . right shoulder surgery  4-59yrs ago   cyst removed  . TEE WITHOUT CARDIOVERSION  05/05/2011   Procedure: TRANSESOPHAGEAL ECHOCARDIOGRAM (TEE);  Surgeon: Lelon Perla, MD;  Location: [facility] ENDOSCOPY;  Service: Cardiovascular;  Laterality: N/A;  . TEE WITHOUT CARDIOVERSION N/A 04/21/2015   Procedure: TRANSESOPHAGEAL ECHOCARDIOGRAM (TEE);  Surgeon: Sueanne Margarita, MD;  Location: [facility] ENDOSCOPY;  Service: Cardiovascular;  Laterality: N/A;  . THROAT SURGERY  4-44yrs ago   "thought " it was cancer but came back not  . TONSILLECTOMY       Current Outpatient Medications  Medication Sig Dispense Refill  . albuterol (PROAIR HFA) 108 (90 Base) MCG/ACT inhaler Inhale 1-2 puffs into the lungs every 6 (six) hours as needed for wheezing or shortness of breath. 1 Inhaler 5  . bisoprolol (ZEBETA) 10 MG tablet Take 10 mg by mouth daily.    . chlorthalidone (HYGROTON) 25 MG tablet Take 25 mg by mouth daily.    . Cholecalciferol (VITAMIN D) 2000 UNITS CAPS Take 2,000 Units by mouth daily.     [name] diltiazem (CARDIZEM CD) 120 MG 24 hr capsule TAKE 1 CAPSULE BY MOUTH DAILY 30 capsule 4  . furosemide (LASIX) 40 MG tablet Take 1 tablet (40 mg total) by mouth daily. Take 1 tablet daily for 5 days 30 tablet 0  . gabapentin (NEURONTIN) 100 MG capsule Take 100 mg by mouth 2 (two) times daily.    [name]  HYDROcodone-acetaminophen (NORCO) 7.5-325 MG tablet Take 1 tablet by mouth every 6 (six) hours as needed for moderate pain.    . Insulin Aspart FlexPen 100 UNIT/ML SOPN Inject 50 Units into the skin 3 (three) times daily with meals. 55 pen 11  . Insulin Detemir (LEVEMIR FLEXTOUCH) 100 UNIT/ML Pen Inject 80 Units into the skin daily with breakfast. 10 pen 11  . Insulin Pen Needle (PEN NEEDLES) 30G X 8 MM MISC 1 each by Does not apply route 5 (five) times daily. E11.9 200 each 1  . liraglutide (VICTOZA) 18 MG/3ML SOPN Inject 0.3 mLs (1.8 mg total) into the skin daily. 9 mL 11  . losartan (COZAAR)  50 MG tablet Take 1 tablet (50 mg total) by mouth daily. 90 tablet 3  . metFORMIN (GLUCOPHAGE-XR) 500 MG 24 hr tablet Take 1 tablet (500 mg total) by mouth 2 (two) times daily. 180 tablet 3  . Multiple Vitamin (MULTIVITAMIN WITH MINERALS) TABS Take 1 tablet by mouth daily.    . nitroGLYCERIN (NITROSTAT) 0.4 MG SL tablet Place 1 tablet (0.4 mg total) under the tongue every 5 (five) minutes x 3 doses as needed for chest pain. 25 tablet 3  . omeprazole (PRILOSEC) 20 MG capsule Take 20 mg by mouth at bedtime.     . potassium chloride (KLOR-CON 10) 10 MEQ tablet Take 10 mEq by mouth daily.     . pravastatin (PRAVACHOL) 80 MG tablet TAKE 1 TABLET BY MOUTH DAILY AT BEDTIME (Patient taking differently: Take 40 mg by mouth daily. ) 30 tablet 10  . sildenafil (REVATIO) 20 MG tablet Take 20-80 mg by mouth daily as needed (erectile dysfunction).    [name] umeclidinium-vilanterol (ANORO ELLIPTA) 62.5-25 MCG/INH AEPB Inhale 1 puff into the lungs daily. 1 each 3  . Vitamin D, Ergocalciferol, (DRISDOL) 1.25 MG (50000 UT) CAPS capsule Take 50,000 Units by mouth every 7 (seven) days.    [name] warfarin (COUMADIN) 5 MG tablet TAKE 1 TO 1 & 1/2 TABLETS BY MOUTH DAILYAS NEEDED AND AS DIRECTED BY THE COUMADIN CLINIC 45 tablet 4   No current facility-administered medications for this visit.    Allergies:   Adhesive [tape] and Latex     ROS:  Please see the history of present illness.   Otherwise, review of systems are positive for neuropathy, back pain.   All other systems are reviewed and negative.    PHYSICAL EXAM: VS:  There were no vitals taken for this visit. , BMI There is no height or weight on file to calculate BMI. GENERAL:  Well appearing NECK: Mild jugular venous distention, waveform within normal limits, carotid upstroke brisk and symmetric, no bruits, no thyromegaly LUNGS: Scattered wheezing and diffuse rhonchi. CHEST:  Unremarkable HEART:  PMI not displaced or sustained,S1 and S2 within normal limits, no S3, no S4, no clicks, no rubs, 2 out of 6 apical murmur heard best throughout the precordium, no diastolic murmurs ABD:  Flat, positive bowel sounds normal in frequency in pitch, no bruits, no rebound, no guarding, no midline pulsatile mass, no hepatomegaly, no splenomegaly EXT:  2 plus pulses throughout, moderate bilateral lower extremity edema, no cyanosis no clubbing    EKG:  EKG is  ordered today. The ekg ordered today demonstrates sinus rhythm, rate 73, axis within normal limits, intervals within normal limits, nonspecific lateral T wave changes.   Recent Labs: 07/29/2019: BNP 102.1; BUN 16; Creatinine, Ser 0.81; Potassium 3.9; Sodium 143; TSH 4.330    Lipid Panel    Component Value Date/Time   CHOL 122 08/18/2017 0910   TRIG 400 (H) 08/18/2017 0910   HDL 29 (L) 08/18/2017 0910   CHOLHDL 4.2 08/18/2017 0910   CHOLHDL 5.3 08/27/2016 0235   VLDL 66 (H) 08/27/2016 0235   LDLCALC 13 08/18/2017 0910   LDLDIRECT 99.9 09/08/2011 0843      Wt Readings from Last 3 Encounters:  07/29/19 273 lb (123.8 kg)  07/11/19 266 lb 3.2 oz (120.7 kg)  06/19/19 264 lb 3.2 oz (119.8 kg)      Other studies Reviewed: Additional studies/ records that were reviewed today include: Care Everywhere labs Review of the above records demonstrates:  Please see elsewhere in the  note.     ASSESSMENT AND  PLAN:   Edema: Started on Lasix 40 mg daily, BNP very mildly elevated.  TTE pending -Check albumin  Palpitations These are short-lived and no change in therapy.   COPD (chronic obstructive pulmonary disease) ([place]) This is followed by Dr. Elsworth Soho and he understands need to stop smoking.  This certainly contributes to some of his breathlessness.   Essential hypertension The blood pressure is elevated today.  However, this is somewhat unusual.  He needs to keep a blood pressure diary  Non-insulin dependent type 2 diabetes mellitus (HCC) A1c was 8.2 which is up a little bit from 7.9.  I will defer to his primary practice.   PAF (paroxysmal atrial fibrillation) (HCC) He tolerates anticoagulation.  No change in therapy.   Normal coronary arteries Of note he had normal coronaries in 2018 despite having coronary calcification noted on the CT this time.  I do not strongly suspect ischemia .    Covid Education He is going to sign up for the vaccine.   Current medicines are reviewed at length with the patient today.  The patient does not have concerns regarding medicines.  The following changes have been made:  no change  Labs/ tests ordered today include: none  No orders of the defined types were placed in this encounter.    Disposition:   ***   Signed, Donato Heinz, MD  08/05/2019 1:18 PM    [facility]

## 2019-08-07 ENCOUNTER — Ambulatory Visit: Payer: 59 | Admitting: Adult Health

## 2019-08-07 ENCOUNTER — Encounter: Payer: Self-pay | Admitting: Cardiology

## 2019-08-07 ENCOUNTER — Ambulatory Visit (INDEPENDENT_AMBULATORY_CARE_PROVIDER_SITE_OTHER): Payer: 59 | Admitting: Cardiology

## 2019-08-07 ENCOUNTER — Other Ambulatory Visit: Payer: Self-pay

## 2019-08-07 ENCOUNTER — Ambulatory Visit: Payer: 59 | Admitting: Cardiology

## 2019-08-07 VITALS — BP 150/78 | HR 78 | Temp 97.3°F | Ht 75.0 in | Wt 261.8 lb

## 2019-08-07 DIAGNOSIS — J432 Centrilobular emphysema: Secondary | ICD-10-CM | POA: Diagnosis not present

## 2019-08-07 DIAGNOSIS — I480 Paroxysmal atrial fibrillation: Secondary | ICD-10-CM

## 2019-08-07 DIAGNOSIS — R0602 Shortness of breath: Secondary | ICD-10-CM | POA: Diagnosis not present

## 2019-08-07 DIAGNOSIS — I50813 Acute on chronic right heart failure: Secondary | ICD-10-CM

## 2019-08-07 DIAGNOSIS — E669 Obesity, unspecified: Secondary | ICD-10-CM

## 2019-08-07 DIAGNOSIS — I10 Essential (primary) hypertension: Secondary | ICD-10-CM | POA: Diagnosis not present

## 2019-08-07 DIAGNOSIS — I50810 Right heart failure, unspecified: Secondary | ICD-10-CM | POA: Insufficient documentation

## 2019-08-07 DIAGNOSIS — G4733 Obstructive sleep apnea (adult) (pediatric): Secondary | ICD-10-CM

## 2019-08-07 DIAGNOSIS — R079 Chest pain, unspecified: Secondary | ICD-10-CM

## 2019-08-07 DIAGNOSIS — Z7901 Long term (current) use of anticoagulants: Secondary | ICD-10-CM

## 2019-08-07 MED ORDER — FUROSEMIDE 40 MG PO TABS: 40.0000 mg | ORAL_TABLET | Freq: Every day | ORAL | 6 refills | Status: DC

## 2019-08-07 NOTE — Progress Notes (Signed)
Cardiology Office Note:    Date:  08/07/2019   ID:  [name], DOB [birth year], MRN [id number]  PCP:  [name]  Cardiologist:  Ryan Breeding, MD  Electrophysiologist:  None   Referring MD: [name], [name]*   No chief complaint on file. F/U after 5 day course of Lasix  History of Present Illness:    [name] is a 64 y.o. male with a hx of atrial fibrillation and atrial flutter. He is s/p flutter ablation in 2014 and has actually done well from this standpoint. He had a cath in May 2018 that showed widely patent coronaries and normal LVF with moderate LVH. Echo done at [facility] in Oct 2020 showed EF 60-65%, AOV sclerosis, mild-moderate TR, mod LAE, normal RVF.  He does have significant COPD and is followed by Dr [name], sleep apnea (intolerant to C-pap), HTN, chronic back pain, and NIDDM with what sounds like LE neuropathy.   He saw Dr [name] 07/29/2019 with c/o weight gain, LE edema, and SOB, suspected Rt heart failure.   Lasix 40 mg daily was added for 5 doses and he is seen today in follow up.  Since that visit he has lost 11 lbs- wgt today 261.8 lbs.  His edema is improved as is his SOB.  He has an echo scheduled for for later this month.    Past Medical History:  Diagnosis Date  . Arthritis    lower back  . Asthma   . Atrial flutter ([place])    s/p CTI ablation by Dr [name]  . Brain aneurysm 2009   questionable. A follow up CTA in 2009 showed no evidence of  . Chronic back pain    DDD/stenosis  . Colon polyps    9 polyps removed 10/13/11  . Complication of anesthesia September 11, 2012   slow to awaken after ablation  . COPD (chronic obstructive pulmonary disease) ([place])   . Diabetes mellitus    takes Metformin and Glimepiride daily  . Emphysema   . GERD (gastroesophageal reflux disease)    takes Omeprazole bid  . History of shingles   . HLD (hyperlipidemia)    takes Pravastatin daily  . HTN (hypertension)    takes Prinizide daily  . Obesity    . OSA (obstructive sleep apnea)    not always using cpap  . Overdose 2009   unintentional Flecanide overdose  . Peripheral neuropathy   . Short-term memory loss   . Tobacco abuse     Past Surgical History:  Procedure Laterality Date  . ANTERIOR CERVICAL DECOMP/DISCECTOMY FUSION  01/04/2012   Procedure: ANTERIOR CERVICAL DECOMPRESSION/DISCECTOMY FUSION 1 LEVEL/HARDWARE REMOVAL;  Surgeon: Ryan Charter, MD;  Location: [facility] NEURO ORS;  Service: Neurosurgery;  Laterality: N/A;  explore cervical fusion Cervical six - seven  with removal of codman plate anterior cervical decompression with fusion interbody prothesis plating and bonegraft  . APPENDECTOMY  2-2yrs ago  . ATRIAL FIBRILLATION ABLATION N/A 09/11/2012   PT DID NOT HAVE AN ATRIAL FIBRILLATION ABLATION IN 2014!  ATRIAL FLUTTER ABLATION ONLY  . ATRIAL FLUTTER ABLATION  09/11/2012   CTI ablation by Dr [name]  . CARDIAC CATHETERIZATION  2008   no significant CAD  . CARDIOVERSION  05/05/2011   Procedure: CARDIOVERSION;  Surgeon: Ryan Perla, MD;  Location: [facility] ENDOSCOPY;  Service: Cardiovascular;  Laterality: N/A;  . CARDIOVERSION Bilateral 07/26/2012   Procedure: CARDIOVERSION;  Surgeon: Ryan Breeding, MD;  Location: [place];  Service: Cardiovascular;  Laterality: Bilateral;  . CARPAL TUNNEL RELEASE  99/2000   bilateral  . COLONOSCOPY WITH PROPOFOL N/A 12/13/2012   Procedure: COLONOSCOPY WITH PROPOFOL;  Surgeon: Ryan Banister, MD;  Location: [facility] ENDOSCOPY;  Service: Endoscopy;  Laterality: N/A;  . ELECTROPHYSIOLOGIC STUDY N/A 04/21/2015   Procedure: Atrial Fibrillation Ablation;  Surgeon: Ryan Grayer, MD;  Location: [place] CV LAB;  Service: Cardiovascular;  Laterality: N/A;  . HERNIA REPAIR    . KNEE SURGERY  6-2yrs ago   left  . LEFT HEART CATH AND CORONARY ANGIOGRAPHY N/A 09/19/2016   Procedure: Left Heart Cath and Coronary Angiography;  Surgeon: Ryan Crome, MD;  Location: [place] CV LAB;  Service:  Cardiovascular;  Laterality: N/A;  . Left inguinal hernia repair     as a child  . NASAL SEPTOPLASTY W/ TURBINOPLASTY Bilateral 02/19/2014   Procedure: NASAL SEPTOPLASTY WITH BILATERAL TURBINATE REDUCTION;  Surgeon: Ryan Marble, MD;  Location: [place];  Service: ENT;  Laterality: Bilateral;  . NECK SURGERY  66yrs ago  . right shoulder surgery  4-38yrs ago   cyst removed  . TEE WITHOUT CARDIOVERSION  05/05/2011   Procedure: TRANSESOPHAGEAL ECHOCARDIOGRAM (TEE);  Surgeon: Ryan Perla, MD;  Location: [facility] ENDOSCOPY;  Service: Cardiovascular;  Laterality: N/A;  . TEE WITHOUT CARDIOVERSION N/A 04/21/2015   Procedure: TRANSESOPHAGEAL ECHOCARDIOGRAM (TEE);  Surgeon: Ryan Margarita, MD;  Location: [facility] ENDOSCOPY;  Service: Cardiovascular;  Laterality: N/A;  . THROAT SURGERY  4-79yrs ago   "thought " it was cancer but came back not  . TONSILLECTOMY      Current Medications: Current Meds  Medication Sig  . albuterol (PROAIR HFA) 108 (90 Base) MCG/ACT inhaler Inhale 1-2 puffs into the lungs every 6 (six) hours as needed for wheezing or shortness of breath.  . bisoprolol (ZEBETA) 10 MG tablet Take 10 mg by mouth daily.  . Cholecalciferol (VITAMIN D) 2000 UNITS CAPS Take 2,000 Units by mouth daily.   [name] diltiazem (CARDIZEM CD) 120 MG 24 hr capsule TAKE 1 CAPSULE BY MOUTH DAILY  . furosemide (LASIX) 40 MG tablet Take 1 tablet (40 mg total) by mouth daily.  [name] gabapentin (NEURONTIN) 100 MG capsule Take 100 mg by mouth 2 (two) times daily.  [name] HYDROcodone-acetaminophen (NORCO) 7.5-325 MG tablet Take 1 tablet by mouth every 6 (six) hours as needed for moderate pain.  . Insulin Aspart FlexPen 100 UNIT/ML SOPN Inject 50 Units into the skin 3 (three) times daily with meals.  . Insulin Detemir (LEVEMIR FLEXTOUCH) 100 UNIT/ML Pen Inject 80 Units into the skin daily with breakfast.  . Insulin Pen Needle (PEN NEEDLES) 30G X 8 MM MISC 1 each by Does not apply route 5 (five) times daily. E11.9  . liraglutide (VICTOZA)  18 MG/3ML SOPN Inject 0.3 mLs (1.8 mg total) into the skin daily.  [name] losartan (COZAAR) 50 MG tablet Take 1 tablet (50 mg total) by mouth daily.  . metFORMIN (GLUCOPHAGE-XR) 500 MG 24 hr tablet Take 1 tablet (500 mg total) by mouth 2 (two) times daily.  . Multiple Vitamin (MULTIVITAMIN WITH MINERALS) TABS Take 1 tablet by mouth daily.  . nitroGLYCERIN (NITROSTAT) 0.4 MG SL tablet Place 1 tablet (0.4 mg total) under the tongue every 5 (five) minutes x 3 doses as needed for chest pain.  [name] omeprazole (PRILOSEC) 20 MG capsule Take 20 mg by mouth at bedtime.   . potassium chloride (KLOR-CON 10) 10 MEQ tablet Take 10 mEq by mouth daily.   . pravastatin (PRAVACHOL) 80  MG tablet TAKE 1 TABLET BY MOUTH DAILY AT BEDTIME (Patient taking differently: Take 40 mg by mouth daily. )  . sildenafil (REVATIO) 20 MG tablet Take 20-80 mg by mouth daily as needed (erectile dysfunction).  [name] umeclidinium-vilanterol (ANORO ELLIPTA) 62.5-25 MCG/INH AEPB Inhale 1 puff into the lungs daily.  . Vitamin D, Ergocalciferol, (DRISDOL) 1.25 MG (50000 UT) CAPS capsule Take 50,000 Units by mouth every 7 (seven) days.  [name] warfarin (COUMADIN) 5 MG tablet TAKE 1 TO 1 & 1/2 TABLETS BY MOUTH DAILYAS NEEDED AND AS DIRECTED BY THE COUMADIN CLINIC  . [DISCONTINUED] chlorthalidone (HYGROTON) 25 MG tablet Take 25 mg by mouth daily.  . [DISCONTINUED] furosemide (LASIX) 40 MG tablet Take 1 tablet (40 mg total) by mouth daily. Take 1 tablet daily for 5 days     Allergies:   Adhesive [tape] and Latex   Social History   Socioeconomic History  . Marital status: Married    Spouse name: Not on file  . Number of children: 2  . Years of education: Not on file  . Highest education level: Not on file  Occupational History  . Occupation: Dealer   Tobacco Use  . Smoking status: Current Every Day Smoker    Packs/day: 1.00    Years: 49.00    Pack years: 49.00    Types: Cigarettes  . Smokeless tobacco: Never Used  . Tobacco comment: He is  going to try 1-800-QUIT Now for nicotine replacement therapy  Substance and Sexual Activity  . Alcohol use: Yes    Alcohol/week: 0.0 standard drinks    Comment: 12/05/17 pt stated he has drinked 1/2 drink in 3 years  . Drug use: No  . Sexual activity: Yes  Other Topics Concern  . Not on file  Social History Narrative   Daily caffeine(Mountain Dew)       Lives in [place] with spouse.   Unemployed due to chronic back/ leg pain         Social Determinants of Health   Financial Resource Strain:   . Difficulty of Paying Living Expenses:   Food Insecurity:   . Worried About Charity fundraiser in the Last Year:   . Arboriculturist in the Last Year:   Transportation Needs:   . Film/video editor (Medical):   [name] Lack of Transportation (Non-Medical):   Physical Activity:   . Days of Exercise per Week:   . Minutes of Exercise per Session:   Stress:   . Feeling of Stress :   Social Connections:   . Frequency of Communication with Friends and Family:   . Frequency of Social Gatherings with Friends and Family:   . Attends Religious Services:   . Active Member of Clubs or Organizations:   . Attends Archivist Meetings:   [name] Marital Status:      Family History: The patient's family history includes Diabetes in his maternal grandmother and mother; Heart disease in his father; Lung cancer in his father. There is no history of Colon cancer, Anesthesia problems, Hypotension, Malignant hyperthermia, or Pseudochol deficiency.  ROS:   Please see the history of present illness.     All other systems reviewed and are negative.  EKGs/Labs/Other Studies Reviewed:    The following studies were reviewed today: Echo Oct 2020- [facility]  EKG:  EKG is not ordered today.  The ekg ordered 07/29/219 demonstrates NSR- HR 73  Recent Labs: 07/29/2019: BNP 102.1; BUN 16; Creatinine, Ser 0.81; Potassium 3.9;  Sodium 143; TSH 4.330  Recent Lipid Panel    Component Value Date/Time    CHOL 122 08/18/2017 0910   TRIG 400 (H) 08/18/2017 0910   HDL 29 (L) 08/18/2017 0910   CHOLHDL 4.2 08/18/2017 0910   CHOLHDL 5.3 08/27/2016 0235   VLDL 66 (H) 08/27/2016 0235   LDLCALC 13 08/18/2017 0910   LDLDIRECT 99.9 09/08/2011 0843    Physical Exam:    VS:  BP (!) 150/78 (BP Location: Right Arm, Patient Position: Sitting)   Pulse 78   Temp (!) 97.3 F (36.3 C)   Ht 6\' 3"  (1.905 m)   Wt 261 lb 12.8 oz (118.8 kg)   SpO2 95%   BMI 32.72 kg/m     Wt Readings from Last 3 Encounters:  08/07/19 261 lb 12.8 oz (118.8 kg)  07/29/19 273 lb (123.8 kg)  07/11/19 266 lb 3.2 oz (120.7 kg)     GEN:  Overweight caucasian male, looks older than stated age, in no acute distress HEENT: Normal NECK: No JVD; No carotid bruits CARDIAC: RRR, 2/6 systolic murmur AOV, no rubs, gallops RESPIRATORY:  Diffuse wheezing ABDOMEN: abdominal obesity, Soft, non-tender, non-distended MUSCULOSKELETAL:  1+ pitting LE edema edema; No deformity  SKIN: Warm and dry NEUROLOGIC:  Alert and oriented x 3 PSYCHIATRIC:  Normal affect   ASSESSMENT:    Acute on chronic Rt heart failure- Improved with Lasix- I will continue him on Lasix 40 mg daily and stop chlorthalidone.  F/U BMP when he comes in for his echo.   COPD (chronic obstructive pulmonary disease) ([place]) Followed by Dr [name]- currently stable  Essential hypertension 138/62 by me. He has not taken his medications yet this am.   Non-insulin dependent type 2 diabetes mellitus ([place]) NIDDM with LE neuropathy. I told him I thought it was unlikely his foot pain at night was secondary to "dehydration".   PAF (paroxysmal atrial fibrillation) (H[facility]) Holding NSR by recent EKGs  Long term current use of anticoagulant therapy Coumadin for PAF  Normal coronary arteries Cath 09/19/16 for chest pain  PLAN:    continue lasix 40 mg daily- stop chlorthalidone- f/u after echo as scheduled. Check BMP when he comes in for his echo. (BNP does not appear to  be useful in his case).    Medication Adjustments/Labs and Tests Ordered: Current medicines are reviewed at length with the patient today.  Concerns regarding medicines are outlined above.  Orders Placed This Encounter  Procedures  . Basic metabolic panel   Meds ordered this encounter  Medications  . furosemide (LASIX) 40 MG tablet    Sig: Take 1 tablet (40 mg total) by mouth daily.    Dispense:  30 tablet    Refill:  6    Patient Instructions  Medication Instructions:   Your physician has recommended you make the following change in your medication:   1) Stop Chlorthalidone 25 mg 2) Continue taking Lasix (Furosemide) 40 mg, 1 tablet by mouth once a day  *If you need a refill on your cardiac medications before your next appointment, please call your pharmacy*  Lab Work:  Your physician recommends that you return for lab work on 08/14/19   If you have labs (blood work) drawn today and your tests are completely normal, you will receive your results only by: [name] MyChart Message (if you have MyChart) OR . A paper copy in the mail If you have any lab test that is abnormal or we need to change your treatment, we will  call you to review the results.  Testing/Procedures:  None ordered today  Follow-Up: At [facility], you and your health needs are our priority.  As part of our continuing mission to provide you with exceptional heart care, we have created designated Provider Care Teams.  These Care Teams include your primary Cardiologist (physician) and Advanced Practice Providers (APPs -  Physician Assistants and Nurse Practitioners) who all work together to provide you with the care you need, when you need it.  We recommend signing up for the patient portal called "MyChart".  Sign up information is provided on this After Visit Summary.  MyChart is used to connect with patients for Virtual Visits (Telemedicine).  Patients are able to view lab/test results, encounter notes, upcoming  appointments, etc.  Non-urgent messages can be sent to your provider as well.   To learn more about what you can do with MyChart, go to [email or web address].    Your next appointment:    Keep your follow up with Roby Lofts on 08/22/19 at 3:45PM      Signed, Kerin Ransom, [name]-[facility]  08/07/2019 9:57 AM    [place]

## 2019-08-07 NOTE — Patient Instructions (Addendum)
Medication Instructions:   Your physician has recommended you make the following change in your medication:   1) Stop Chlorthalidone 25 mg 2) Continue taking Lasix (Furosemide) 40 mg, 1 tablet by mouth once a day  *If you need a refill on your cardiac medications before your next appointment, please call your pharmacy*  Lab Work:  Your physician recommends that you return for lab work on 08/14/19   If you have labs (blood work) drawn today and your tests are completely normal, you will receive your results only by: [name] MyChart Message (if you have MyChart) OR . A paper copy in the mail If you have any lab test that is abnormal or we need to change your treatment, we will call you to review the results.  Testing/Procedures:  None ordered today  Follow-Up: At [facility], you and your health needs are our priority.  As part of our continuing mission to provide you with exceptional heart care, we have created designated Provider Care Teams.  These Care Teams include your primary Cardiologist (physician) and Advanced Practice Providers (APPs -  Physician Assistants and Nurse Practitioners) who all work together to provide you with the care you need, when you need it.  We recommend signing up for the patient portal called "MyChart".  Sign up information is provided on this After Visit Summary.  MyChart is used to connect with patients for Virtual Visits (Telemedicine).  Patients are able to view lab/test results, encounter notes, upcoming appointments, etc.  Non-urgent messages can be sent to your provider as well.   To learn more about what you can do with MyChart, go to [email or web address].    Your next appointment:    Keep your follow up with Roby Lofts on 08/22/19 at 3:45PM

## 2019-08-14 ENCOUNTER — Ambulatory Visit (INDEPENDENT_AMBULATORY_CARE_PROVIDER_SITE_OTHER): Payer: 59 | Admitting: Pharmacist

## 2019-08-14 ENCOUNTER — Ambulatory Visit (HOSPITAL_COMMUNITY): Payer: 59 | Attending: Cardiovascular Disease

## 2019-08-14 ENCOUNTER — Other Ambulatory Visit: Payer: Self-pay

## 2019-08-14 ENCOUNTER — Other Ambulatory Visit: Payer: 59 | Admitting: *Deleted

## 2019-08-14 DIAGNOSIS — I4892 Unspecified atrial flutter: Secondary | ICD-10-CM | POA: Diagnosis not present

## 2019-08-14 DIAGNOSIS — R0602 Shortness of breath: Secondary | ICD-10-CM | POA: Insufficient documentation

## 2019-08-14 DIAGNOSIS — I10 Essential (primary) hypertension: Secondary | ICD-10-CM

## 2019-08-14 DIAGNOSIS — Z7901 Long term (current) use of anticoagulants: Secondary | ICD-10-CM | POA: Diagnosis not present

## 2019-08-14 DIAGNOSIS — I480 Paroxysmal atrial fibrillation: Secondary | ICD-10-CM

## 2019-08-14 LAB — BASIC METABOLIC PANEL
BUN/Creatinine Ratio: 18 (ref 10–24)
BUN: 15 mg/dL (ref 8–27)
CO2: 25 mmol/L (ref 20–29)
Calcium: 9.4 mg/dL (ref 8.6–10.2)
Chloride: 101 mmol/L (ref 96–106)
Creatinine, Ser: 0.85 mg/dL (ref 0.76–1.27)
GFR calc Af Amer: 109 mL/min/{1.73_m2} (ref >59)
GFR calc non Af Amer: 94 mL/min/{1.73_m2} (ref >59)
Glucose: 249 mg/dL — ABNORMAL HIGH (ref 65–99)
Potassium: 4.2 mmol/L (ref 3.5–5.2)
Sodium: 139 mmol/L (ref 134–144)

## 2019-08-14 LAB — POCT INR: INR: 2.8 (ref 2.0–3.0)

## 2019-08-14 NOTE — Patient Instructions (Signed)
Description   Continue taking 1.5 tablets daily except 1 tablet on Mondays, Wednesdays  and Fridays. Repeat INR in 4 weeks. [facility] [phone]

## 2019-08-19 ENCOUNTER — Other Ambulatory Visit: Payer: Self-pay

## 2019-08-19 DIAGNOSIS — I517 Cardiomegaly: Secondary | ICD-10-CM

## 2019-08-19 NOTE — Progress Notes (Signed)
Minus Breeding, MD  08/15/2019 2:22 PM EDT    NL LV function but moderate LVH and biatrial enlargement. Send for a PYP scan please.

## 2019-08-20 ENCOUNTER — Other Ambulatory Visit: Payer: Self-pay

## 2019-08-22 ENCOUNTER — Ambulatory Visit (INDEPENDENT_AMBULATORY_CARE_PROVIDER_SITE_OTHER): Payer: 59 | Admitting: Endocrinology

## 2019-08-22 ENCOUNTER — Encounter: Payer: Self-pay | Admitting: Endocrinology

## 2019-08-22 ENCOUNTER — Encounter: Payer: Self-pay | Admitting: Medical

## 2019-08-22 ENCOUNTER — Ambulatory Visit (INDEPENDENT_AMBULATORY_CARE_PROVIDER_SITE_OTHER): Payer: 59 | Admitting: Medical

## 2019-08-22 ENCOUNTER — Telehealth: Payer: Self-pay | Admitting: Cardiology

## 2019-08-22 ENCOUNTER — Other Ambulatory Visit: Payer: Self-pay

## 2019-08-22 VITALS — BP 130/70 | HR 75 | Ht 75.0 in | Wt 276.0 lb

## 2019-08-22 VITALS — BP 140/73 | HR 72 | Temp 97.9°F | Ht 75.0 in | Wt 275.0 lb

## 2019-08-22 DIAGNOSIS — I480 Paroxysmal atrial fibrillation: Secondary | ICD-10-CM | POA: Diagnosis not present

## 2019-08-22 DIAGNOSIS — I10 Essential (primary) hypertension: Secondary | ICD-10-CM | POA: Diagnosis not present

## 2019-08-22 DIAGNOSIS — E119 Type 2 diabetes mellitus without complications: Secondary | ICD-10-CM

## 2019-08-22 DIAGNOSIS — Z794 Long term (current) use of insulin: Secondary | ICD-10-CM | POA: Diagnosis not present

## 2019-08-22 DIAGNOSIS — M79671 Pain in right foot: Secondary | ICD-10-CM | POA: Diagnosis not present

## 2019-08-22 DIAGNOSIS — Z7901 Long term (current) use of anticoagulants: Secondary | ICD-10-CM

## 2019-08-22 DIAGNOSIS — G4733 Obstructive sleep apnea (adult) (pediatric): Secondary | ICD-10-CM

## 2019-08-22 DIAGNOSIS — I5033 Acute on chronic diastolic (congestive) heart failure: Secondary | ICD-10-CM | POA: Diagnosis not present

## 2019-08-22 DIAGNOSIS — M79672 Pain in left foot: Secondary | ICD-10-CM

## 2019-08-22 DIAGNOSIS — M79673 Pain in unspecified foot: Secondary | ICD-10-CM | POA: Insufficient documentation

## 2019-08-22 LAB — POCT GLYCOSYLATED HEMOGLOBIN (HGB A1C): Hemoglobin A1C: 8.4 % — AB (ref 4.0–5.6)

## 2019-08-22 MED ORDER — POTASSIUM CHLORIDE CRYS ER 20 MEQ PO TBCR: 20.0000 meq | EXTENDED_RELEASE_TABLET | Freq: Every day | ORAL | 3 refills | Status: DC

## 2019-08-22 MED ORDER — INSULIN ASPART FLEXPEN 100 UNIT/ML ~~LOC~~ SOPN: 60.0000 [IU] | PEN_INJECTOR | Freq: Three times a day (TID) | SUBCUTANEOUS | 11 refills | Status: DC

## 2019-08-22 MED ORDER — TORSEMIDE 20 MG PO TABS: 40.0000 mg | ORAL_TABLET | Freq: Two times a day (BID) | ORAL | 3 refills | Status: DC

## 2019-08-22 NOTE — Progress Notes (Signed)
Cardiology Office Note   Date:  08/22/2019   ID:  [name], DOB 02-27-1956, MRN [id number]  PCP:  [facility]  Cardiologist:  Minus Breeding, MD EP: None  Chief Complaint  Patient presents with  . Edema  . Shortness of Breath      History of Present Illness: [name] is a 64 y.o. male with PMH of atrial fibrillation/flutter s/p flutter ablation in [id number], chronic diastolic CHF, HTN, DM type 2, OSA intolerant to CPAP, COPD, and tobacco abuse who presents for evaluation of chest pain and SOB.  He was recently evaluated by cardiology at an outpatient visit with Kerin Ransom 08/07/19 for close follow-up of recent weight gain, SOB, and LE edema 2/2 right-sided heart failure managed with a 5 day course of lasix. He reported improvement in his symptoms with weight loss of 11lbs. Decision made to stop chlorthalidone and start lasix 40mg  daily at that time. He had an echo 08/14/19 showed EF 60-65%, moderate LVH, G2DD, no RWMA, elevated LA pressures, moderately elevated PA pressures, severe LAE, moderate RAE, mild MR, and mild-moderate TR. Based on his recent echocardiogram he was recommended to undergo a PYP scan to evaluate for amyloidosis, currently scheduled for 09/06/19. His last ischemic evaluation was a LHC in 2018 which revealed normal coronary arteries with elevated EDP c/w chronic diastolic CHF.  He presents today for close follow-up. He has had worsening of his LE edema and SOB since his last visit. His weight is up 15lbs. Suspect ongoing dietary indiscretion as he has continued to eat deli meat and hot dogs, and drinks 3-16oz sodas a day. His has progressive DOE and noted an episode of hypoxia to 88% this morning, improved to 96% in office today; of note, he has unmanaged OSA. He has orthopnea and has been sleeping upright on the couch for the past several nights. No complaints of chest pain. He has occasional orthostatic symptoms but no syncope. No palpitations. No  bleeding on coumadin.    Past Medical History:  Diagnosis Date  . Arthritis    lower back  . Asthma   . Atrial flutter ([place])    s/p CTI ablation by Dr Rayann Heman  . Brain aneurysm 2009   questionable. A follow up CTA in 2009 showed no evidence of  . Chronic back pain    DDD/stenosis  . Colon polyps    9 polyps removed 10/13/11  . Complication of anesthesia September 11, 2012   slow to awaken after ablation  . COPD (chronic obstructive pulmonary disease) ([place])   . Diabetes mellitus    takes Metformin and Glimepiride daily  . Emphysema   . GERD (gastroesophageal reflux disease)    takes Omeprazole bid  . History of shingles   . HLD (hyperlipidemia)    takes Pravastatin daily  . HTN (hypertension)    takes Prinizide daily  . Obesity   . OSA (obstructive sleep apnea)    not always using cpap  . Overdose 2009   unintentional Flecanide overdose  . Peripheral neuropathy   . Short-term memory loss   . Tobacco abuse     Past Surgical History:  Procedure Laterality Date  . ANTERIOR CERVICAL DECOMP/DISCECTOMY FUSION  01/04/2012   Procedure: ANTERIOR CERVICAL DECOMPRESSION/DISCECTOMY FUSION 1 LEVEL/HARDWARE REMOVAL;  Surgeon: Ophelia Charter, MD;  Location: [place];  Service: Neurosurgery;  Laterality: N/A;  explore cervical fusion Cervical six - seven  with removal of codman plate anterior cervical decompression with  fusion interbody prothesis plating and bonegraft  . APPENDECTOMY  2-69yrs ago  . ATRIAL FIBRILLATION ABLATION N/A 09/11/2012   PT DID NOT HAVE AN ATRIAL FIBRILLATION ABLATION IN 2014!  ATRIAL FLUTTER ABLATION ONLY  . ATRIAL FLUTTER ABLATION  09/11/2012   CTI ablation by Dr Rayann Heman  . CARDIAC CATHETERIZATION  2008   no significant CAD  . CARDIOVERSION  05/05/2011   Procedure: CARDIOVERSION;  Surgeon: Lelon Perla, MD;  Location: [facility] ENDOSCOPY;  Service: Cardiovascular;  Laterality: N/A;  . CARDIOVERSION Bilateral 07/26/2012   Procedure: CARDIOVERSION;  Surgeon: Minus Breeding, MD;  Location: [facility] ENDOSCOPY;  Service: Cardiovascular;  Laterality: Bilateral;  . CARPAL TUNNEL RELEASE  99/2000   bilateral  . COLONOSCOPY WITH PROPOFOL N/A 12/13/2012   Procedure: COLONOSCOPY WITH PROPOFOL;  Surgeon: Milus Banister, MD;  Location: [facility] ENDOSCOPY;  Service: Endoscopy;  Laterality: N/A;  . ELECTROPHYSIOLOGIC STUDY N/A 04/21/2015   Procedure: Atrial Fibrillation Ablation;  Surgeon: Thompson Grayer, MD;  Location: [place] CV LAB;  Service: Cardiovascular;  Laterality: N/A;  . HERNIA REPAIR    . KNEE SURGERY  6-28yrs ago   left  . LEFT HEART CATH AND CORONARY ANGIOGRAPHY N/A 09/19/2016   Procedure: Left Heart Cath and Coronary Angiography;  Surgeon: Belva Crome, MD;  Location: [place] CV LAB;  Service: Cardiovascular;  Laterality: N/A;  . Left inguinal hernia repair     as a child  . NASAL SEPTOPLASTY W/ TURBINOPLASTY Bilateral 02/19/2014   Procedure: NASAL SEPTOPLASTY WITH BILATERAL TURBINATE REDUCTION;  Surgeon: Jodi Marble, MD;  Location: [place];  Service: ENT;  Laterality: Bilateral;  . NECK SURGERY  82yrs ago  . right shoulder surgery  4-31yrs ago   cyst removed  . TEE WITHOUT CARDIOVERSION  05/05/2011   Procedure: TRANSESOPHAGEAL ECHOCARDIOGRAM (TEE);  Surgeon: Lelon Perla, MD;  Location: [facility] ENDOSCOPY;  Service: Cardiovascular;  Laterality: N/A;  . TEE WITHOUT CARDIOVERSION N/A 04/21/2015   Procedure: TRANSESOPHAGEAL ECHOCARDIOGRAM (TEE);  Surgeon: Sueanne Margarita, MD;  Location: [facility] ENDOSCOPY;  Service: Cardiovascular;  Laterality: N/A;  . THROAT SURGERY  4-9yrs ago   "thought " it was cancer but came back not  . TONSILLECTOMY       Current Outpatient Medications  Medication Sig Dispense Refill  . albuterol (PROAIR HFA) 108 (90 Base) MCG/ACT inhaler Inhale 1-2 puffs into the lungs every 6 (six) hours as needed for wheezing or shortness of breath. 1 Inhaler 5  . bisoprolol (ZEBETA) 10 MG tablet Take 10 mg by mouth daily.    . Cholecalciferol (VITAMIN  D) 2000 UNITS CAPS Take 2,000 Units by mouth daily.     [name] diltiazem (CARDIZEM CD) 120 MG 24 hr capsule TAKE 1 CAPSULE BY MOUTH DAILY 30 capsule 4  . gabapentin (NEURONTIN) 100 MG capsule Take 100 mg by mouth 2 (two) times daily.    [name] HYDROcodone-acetaminophen (NORCO) 7.5-325 MG tablet Take 1 tablet by mouth every 6 (six) hours as needed for moderate pain.    . Insulin Aspart FlexPen 100 UNIT/ML SOPN Inject 60 Units into the skin 3 (three) times daily with meals. 55 pen 11  . Insulin Detemir (LEVEMIR FLEXTOUCH) 100 UNIT/ML Pen Inject 80 Units into the skin daily with breakfast. 10 pen 11  . Insulin Pen Needle (PEN NEEDLES) 30G X 8 MM MISC 1 each by Does not apply route 5 (five) times daily. E11.9 200 each 1  . liraglutide (VICTOZA) 18 MG/3ML SOPN Inject 0.3 mLs (1.8 mg total) into the skin  daily. 9 mL 11  . losartan (COZAAR) 50 MG tablet Take 1 tablet (50 mg total) by mouth daily. 90 tablet 3  . metFORMIN (GLUCOPHAGE-XR) 500 MG 24 hr tablet Take 1 tablet (500 mg total) by mouth 2 (two) times daily. 180 tablet 3  . Multiple Vitamin (MULTIVITAMIN WITH MINERALS) TABS Take 1 tablet by mouth daily.    . nitroGLYCERIN (NITROSTAT) 0.4 MG SL tablet Place 1 tablet (0.4 mg total) under the tongue every 5 (five) minutes x 3 doses as needed for chest pain. 25 tablet 3  . omeprazole (PRILOSEC) 20 MG capsule Take 20 mg by mouth at bedtime.     . pravastatin (PRAVACHOL) 80 MG tablet TAKE 1 TABLET BY MOUTH DAILY AT BEDTIME (Patient taking differently: Take 40 mg by mouth daily. ) 30 tablet 10  . sildenafil (REVATIO) 20 MG tablet Take 20-80 mg by mouth daily as needed (erectile dysfunction).    [name] umeclidinium-vilanterol (ANORO ELLIPTA) 62.5-25 MCG/INH AEPB Inhale 1 puff into the lungs daily. 1 each 3  . Vitamin D, Ergocalciferol, (DRISDOL) 1.25 MG (50000 UT) CAPS capsule Take 50,000 Units by mouth every 7 (seven) days.    [name] warfarin (COUMADIN) 5 MG tablet TAKE 1 TO 1 & 1/2 TABLETS BY MOUTH DAILYAS NEEDED AND AS  DIRECTED BY THE COUMADIN CLINIC 45 tablet 4  . potassium chloride SA (KLOR-CON M20) 20 MEQ tablet Take 1 tablet (20 mEq total) by mouth daily. 30 tablet 3  . torsemide (DEMADEX) 20 MG tablet Take 2 tablets (40 mg total) by mouth 2 (two) times daily. 120 tablet 3   No current facility-administered medications for this visit.    Allergies:   Adhesive [tape] and Latex    Social History:  The patient  reports that he has been smoking cigarettes. He has a 49.00 pack-year smoking history. He has never used smokeless tobacco. He reports current alcohol use. He reports that he does not use drugs.   Family History:  The patient's family history includes Diabetes in his maternal grandmother and mother; Heart disease in his father; Lung cancer in his father.    ROS:  Please see the history of present illness.   Otherwise, review of systems are positive for none.   All other systems are reviewed and negative.    PHYSICAL EXAM: VS:  BP 140/73   Pulse 72   Temp 97.9 F (36.6 C)   Ht 6\' 3"  (1.905 m)   Wt 275 lb (124.7 kg)   SpO2 96%   BMI 34.37 kg/m  , BMI Body mass index is 34.37 kg/m. GEN: Well nourished, well developed, in NAD HEENT: sclera anicteric Neck: no JVD, carotid bruits, or masses Cardiac: RRR; +murmurs, rubs, or gallops, 3+edema  Respiratory:  clear to auscultation bilaterally, mildly SOB after ambulating to the room.  GI: nontender, distended, + BS MS: no deformity or atrophy Skin: warm and dry extremities Neuro:  Strength and sensation are intact Psych: euthymic mood, full affect   EKG:  EKG is ordered today. The ekg ordered today demonstrates sinus rhythm with rate 72 bpm, non-specific T wave abnormalities, no STE/D, no significant change from previous.    Recent Labs: 07/29/2019: BNP 102.1; TSH 4.330 08/14/2019: BUN 15; Creatinine, Ser 0.85; Potassium 4.2; Sodium 139    Lipid Panel    Component Value Date/Time   CHOL 122 08/18/2017 0910   TRIG 400 (H) 08/18/2017  0910   HDL 29 (L) 08/18/2017 0910   CHOLHDL 4.2 08/18/2017 0910  CHOLHDL 5.3 08/27/2016 0235   VLDL 66 (H) 08/27/2016 0235   LDLCALC 13 08/18/2017 0910   LDLDIRECT 99.9 09/08/2011 0843      Wt Readings from Last 3 Encounters:  08/22/19 275 lb (124.7 kg)  08/22/19 276 lb (125.2 kg)  08/07/19 261 lb 12.8 oz (118.8 kg)      Other studies Reviewed: Additional studies/ records that were reviewed today include:   Echocardiogram 08/14/19: 1. Left ventricular ejection fraction, by estimation, is 60 to 65%. The  left ventricle has normal function. The left ventricle has no regional  wall motion abnormalities. There is moderate concentric left ventricular  hypertrophy. Left ventricular  diastolic parameters are consistent with Grade II diastolic dysfunction  (pseudonormalization). Elevated left atrial pressure.  2. Right ventricular systolic function is normal. The right ventricular  size is normal. There is moderately elevated pulmonary artery systolic  pressure. The estimated right ventricular systolic pressure is 0000000 mmHg.  3. Left atrial size was severely dilated.  4. Right atrial size was moderately dilated.  5. The mitral valve is normal in structure. Mild mitral valve  regurgitation. No evidence of mitral stenosis.  6. Tricuspid valve regurgitation is mild to moderate.  7. The aortic valve is tricuspid. Aortic valve regurgitation is not  visualized. Mild to moderate aortic valve sclerosis/calcification is  present, without any evidence of aortic stenosis.  8. The inferior vena cava is dilated in size with >50% respiratory  variability, suggesting right atrial pressure of 8 mmHg.   Left heart catheterization 2018:  Widely patent/normal coronary arteries.  Normal left ventricular systolic function with EF greater than 55%. Elevated end-diastolic pressure, consistent with chronic diastolic dysfunction.   RECOMMENDATIONS:   Symptoms are not related to ischemic  heart disease.  Consider chronic sleep deprivation, PAD, or other explanations.    ASSESSMENT AND PLAN:  1. Acute on chronic diastolic CHF: recent echo with EF 60-65%, G2DD, moderate LVH, elevated LA and PA pressures - Dr. Percival Spanish recommended a PYP scan to r/o amyloidosis. Weight is up to 276lbs today from 261lbs 08/07/19. He is markedly volume overloaded today with abdominal distention and 3+ edema in his legs. He is not interested in admission for IV diuresis at this time but we discussed a low threshold to present to the ED if symptoms worsen prior to his follow-up on Monday. - Discussed with Dr. Audie Box - will transition from lasix to torsemide 40mg  daily - Anticipate BMET check at next visit - We discussed importance of low sodium diet (educated again), limiting fluid intake to 64 oz per day especially limiting soda intake, and daily weights.  - Scheduled for PYP scan 09/06/19.   2. Paroxysmal atrial fibrillation/flutter s/p flutter ablation in 2014: maintaining sinus rhythm. INR 2.8 08/14/19 - Continue diltiazem and bisoprolol for rate control - Continue coumadin for stroke ppx  3. HTN: BP 140/73 today - Continue lasix, diltiazem, bisoprolol, and losartan  4. DM type 2: A1C 8.4 08/22/19. Seen by endocrinology this morning and Novolog increased to 60 units TID, in addition to continue metformin, levemir, and victoza.  - Continue management per endocrinology   Current medicines are reviewed at length with the patient today.  The patient does not have concerns regarding medicines.  The following changes have been made:  As above  Labs/ tests ordered today include:   Orders Placed This Encounter  Procedures  . EKG 12-Lead     Disposition:   I intended to see him back in office 08/30/19, however his wife  is anticipating a LHC that day. Will have him follow-up with Kerin Ransom 08/26/19 for continuity of care since he saw him 08/07/19.  Signed, Abigail Butts, PA-C  08/22/2019 4:46  PM

## 2019-08-22 NOTE — Telephone Encounter (Signed)
Returned call to patient and informed him that it is okay for his wife to accompany him to today's office visit.

## 2019-08-22 NOTE — Progress Notes (Signed)
Subjective:    Patient ID: [name], male    DOB: 02-01-1956, 64 y.o.   MRN: [id number]  HPI Pt returns for f/u of diabetes mellitus:  DM type: Insulin-requiring type 2 Dx'ed: 0000000 Complications: none Therapy: insulin since 2019, metformin, and Victoza.  DKA: never Severe hypoglycemia: never Pancreatitis: never Pancreatic imaging: normal on 2017 CT SDOH: none Other: he takes multiple daily injections; he did not tolerate Jardiance (dehydration); metformin dosage is limited by abd pain.    Interval history: Pt says he seldom misses the insulin.  no cbg record, but states cbg's vary from 130-350.    Past Medical History:  Diagnosis Date  . Arthritis    lower back  . Asthma   . Atrial flutter ([place])    s/p CTI ablation by Dr Rayann Heman  . Brain aneurysm 2009   questionable. A follow up CTA in 2009 showed no evidence of  . Chronic back pain    DDD/stenosis  . Colon polyps    9 polyps removed 10/13/11  . Complication of anesthesia September 11, 2012   slow to awaken after ablation  . COPD (chronic obstructive pulmonary disease) ([place])   . Diabetes mellitus    takes Metformin and Glimepiride daily  . Emphysema   . GERD (gastroesophageal reflux disease)    takes Omeprazole bid  . History of shingles   . HLD (hyperlipidemia)    takes Pravastatin daily  . HTN (hypertension)    takes Prinizide daily  . Obesity   . OSA (obstructive sleep apnea)    not always using cpap  . Overdose 2009   unintentional Flecanide overdose  . Peripheral neuropathy   . Short-term memory loss   . Tobacco abuse     Past Surgical History:  Procedure Laterality Date  . ANTERIOR CERVICAL DECOMP/DISCECTOMY FUSION  01/04/2012   Procedure: ANTERIOR CERVICAL DECOMPRESSION/DISCECTOMY FUSION 1 LEVEL/HARDWARE REMOVAL;  Surgeon: Ophelia Charter, MD;  Location: [facility];  Service: Neurosurgery;  Laterality: N/A;  explore cervical fusion Cervical six - seven  with removal of codman plate anterior  cervical decompression with fusion interbody prothesis plating and bonegraft  . APPENDECTOMY  2-71yrs ago  . ATRIAL FIBRILLATION ABLATION N/A 09/11/2012   PT DID NOT HAVE AN ATRIAL FIBRILLATION ABLATION IN 2014!  ATRIAL FLUTTER ABLATION ONLY  . ATRIAL FLUTTER ABLATION  09/11/2012   CTI ablation by Dr Rayann Heman  . CARDIAC CATHETERIZATION  2008   no significant CAD  . CARDIOVERSION  05/05/2011   Procedure: CARDIOVERSION;  Surgeon: Lelon Perla, MD;  Location: [facility] ENDOSCOPY;  Service: Cardiovascular;  Laterality: N/A;  . CARDIOVERSION Bilateral 07/26/2012   Procedure: CARDIOVERSION;  Surgeon: Minus Breeding, MD;  Location: [facility] ENDOSCOPY;  Service: Cardiovascular;  Laterality: Bilateral;  . CARPAL TUNNEL RELEASE  99/2000   bilateral  . COLONOSCOPY WITH PROPOFOL N/A 12/13/2012   Procedure: COLONOSCOPY WITH PROPOFOL;  Surgeon: Milus Banister, MD;  Location: [facility] ENDOSCOPY;  Service: Endoscopy;  Laterality: N/A;  . ELECTROPHYSIOLOGIC STUDY N/A 04/21/2015   Procedure: Atrial Fibrillation Ablation;  Surgeon: Thompson Grayer, MD;  Location: [facility] CV LAB;  Service: Cardiovascular;  Laterality: N/A;  . HERNIA REPAIR    . KNEE SURGERY  6-34yrs ago   left  . LEFT HEART CATH AND CORONARY ANGIOGRAPHY N/A 09/19/2016   Procedure: Left Heart Cath and Coronary Angiography;  Surgeon: Belva Crome, MD;  Location: [facility];  Service: Cardiovascular;  Laterality: N/A;  . Left inguinal hernia repair  as a child  . NASAL SEPTOPLASTY W/ TURBINOPLASTY Bilateral 02/19/2014   Procedure: NASAL SEPTOPLASTY WITH BILATERAL TURBINATE REDUCTION;  Surgeon: Jodi Marble, MD;  Location: [facility];  Service: ENT;  Laterality: Bilateral;  . NECK SURGERY  23yrs ago  . right shoulder surgery  4-34yrs ago   cyst removed  . TEE WITHOUT CARDIOVERSION  05/05/2011   Procedure: TRANSESOPHAGEAL ECHOCARDIOGRAM (TEE);  Surgeon: Lelon Perla, MD;  Location: [facility] ENDOSCOPY;  Service: Cardiovascular;  Laterality: N/A;  . TEE WITHOUT  CARDIOVERSION N/A 04/21/2015   Procedure: TRANSESOPHAGEAL ECHOCARDIOGRAM (TEE);  Surgeon: Sueanne Margarita, MD;  Location: [facility] ENDOSCOPY;  Service: Cardiovascular;  Laterality: N/A;  . THROAT SURGERY  4-9yrs ago   "thought " it was cancer but came back not  . TONSILLECTOMY      Social History   Socioeconomic History  . Marital status: Married    Spouse name: Not on file  . Number of children: 2  . Years of education: Not on file  . Highest education level: Not on file  Occupational History  . Occupation: Dealer   Tobacco Use  . Smoking status: Current Every Day Smoker    Packs/day: 1.00    Years: 49.00    Pack years: 49.00    Types: Cigarettes  . Smokeless tobacco: Never Used  . Tobacco comment: He is going to try 1-800-QUIT Now for nicotine replacement therapy  Substance and Sexual Activity  . Alcohol use: Yes    Alcohol/week: 0.0 standard drinks    Comment: 12/05/17 pt stated he has drinked 1/2 drink in 3 years  . Drug use: No  . Sexual activity: Yes  Other Topics Concern  . Not on file  Social History Narrative   Daily caffeine(Mountain Dew)       Lives in [place] with spouse.   Unemployed due to chronic back/ leg pain         Social Determinants of Health   Financial Resource Strain:   . Difficulty of Paying Living Expenses:   Food Insecurity:   . Worried About Charity fundraiser in the Last Year:   . Arboriculturist in the Last Year:   Transportation Needs:   . Film/video editor (Medical):   [name] Lack of Transportation (Non-Medical):   Physical Activity:   . Days of Exercise per Week:   . Minutes of Exercise per Session:   Stress:   . Feeling of Stress :   Social Connections:   . Frequency of Communication with Friends and Family:   . Frequency of Social Gatherings with Friends and Family:   . Attends Religious Services:   . Active Member of Clubs or Organizations:   . Attends Archivist Meetings:   [name] Marital Status:   Intimate  Partner Violence:   . Fear of Current or Ex-Partner:   . Emotionally Abused:   [name] Physically Abused:   . Sexually Abused:     Current Outpatient Medications on File Prior to Visit  Medication Sig Dispense Refill  . albuterol (PROAIR HFA) 108 (90 Base) MCG/ACT inhaler Inhale 1-2 puffs into the lungs every 6 (six) hours as needed for wheezing or shortness of breath. 1 Inhaler 5  . bisoprolol (ZEBETA) 10 MG tablet Take 10 mg by mouth daily.    . Cholecalciferol (VITAMIN D) 2000 UNITS CAPS Take 2,000 Units by mouth daily.     [name] diltiazem (CARDIZEM CD) 120 MG 24 hr capsule TAKE 1 CAPSULE BY MOUTH  DAILY 30 capsule 4  . gabapentin (NEURONTIN) 100 MG capsule Take 100 mg by mouth 2 (two) times daily.    [name] HYDROcodone-acetaminophen (NORCO) 7.5-325 MG tablet Take 1 tablet by mouth every 6 (six) hours as needed for moderate pain.    . Insulin Detemir (LEVEMIR FLEXTOUCH) 100 UNIT/ML Pen Inject 80 Units into the skin daily with breakfast. 10 pen 11  . Insulin Pen Needle (PEN NEEDLES) 30G X 8 MM MISC 1 each by Does not apply route 5 (five) times daily. E11.9 200 each 1  . liraglutide (VICTOZA) 18 MG/3ML SOPN Inject 0.3 mLs (1.8 mg total) into the skin daily. 9 mL 11  . losartan (COZAAR) 50 MG tablet Take 1 tablet (50 mg total) by mouth daily. 90 tablet 3  . metFORMIN (GLUCOPHAGE-XR) 500 MG 24 hr tablet Take 1 tablet (500 mg total) by mouth 2 (two) times daily. 180 tablet 3  . Multiple Vitamin (MULTIVITAMIN WITH MINERALS) TABS Take 1 tablet by mouth daily.    . nitroGLYCERIN (NITROSTAT) 0.4 MG SL tablet Place 1 tablet (0.4 mg total) under the tongue every 5 (five) minutes x 3 doses as needed for chest pain. 25 tablet 3  . omeprazole (PRILOSEC) 20 MG capsule Take 20 mg by mouth at bedtime.     . pravastatin (PRAVACHOL) 80 MG tablet TAKE 1 TABLET BY MOUTH DAILY AT BEDTIME (Patient taking differently: Take 40 mg by mouth daily. ) 30 tablet 10  . sildenafil (REVATIO) 20 MG tablet Take 20-80 mg by mouth daily as  needed (erectile dysfunction).    [name] umeclidinium-vilanterol (ANORO ELLIPTA) 62.5-25 MCG/INH AEPB Inhale 1 puff into the lungs daily. 1 each 3  . Vitamin D, Ergocalciferol, (DRISDOL) 1.25 MG (50000 UT) CAPS capsule Take 50,000 Units by mouth every 7 (seven) days.    [name] warfarin (COUMADIN) 5 MG tablet TAKE 1 TO 1 & 1/2 TABLETS BY MOUTH DAILYAS NEEDED AND AS DIRECTED BY THE COUMADIN CLINIC 45 tablet 4   No current facility-administered medications on file prior to visit.    Allergies  Allergen Reactions  . Adhesive [Tape]     itching  . Latex Itching    When tape is on the skin too long skin gets red & itching    Family History  Problem Relation Age of Onset  . Diabetes Mother   . Heart disease Father   . Lung cancer Father   . Diabetes Maternal Grandmother   . Colon cancer Neg Hx   . Anesthesia problems Neg Hx   . Hypotension Neg Hx   . Malignant hyperthermia Neg Hx   . Pseudochol deficiency Neg Hx     BP 130/70   Pulse 75   Ht 6\' 3"  (1.905 m)   Wt 276 lb (125.2 kg)   SpO2 93%   BMI 34.50 kg/m    Review of Systems He denies hypoglycemia.  Main symptom is foot pain.     Objective:   Physical Exam VITAL SIGNS:  See vs page GENERAL: no distress Pulses: dorsalis pedis intact bilat.   MSK: no deformity of the feet CV: 1+ bilat leg edema Skin:  no ulcer on the feet, but the skin is dry.  normal temp on the feet.  Both legs and feet are erythematous. Neuro: sensation is intact to touch on the feet, but decreased from normal.   Ext: there is bilateral onychomycosis of the toenails.   Lab Results  Component Value Date   HGBA1C 8.4 (A) 08/22/2019  Lab Results  Component Value Date   CREATININE 0.85 08/14/2019   BUN 15 08/14/2019   NA 139 08/14/2019   K 4.2 08/14/2019   CL 101 08/14/2019   CO2 25 08/14/2019       Assessment & Plan:  Foot pain, new: ref podiatry Insulin-requiring type 2 DM: he needs increased rx.   Patient Instructions  Please increase the  Novolog to 60 units 3 times a day (just before each meal). Please continue the same metformin, Levemir, and Victoza.   check your blood sugar twice a day.  vary the time of day when you check, between before the 3 meals, and at bedtime.  also check if you have symptoms of your blood sugar being too high or too low.  please keep a record of the readings and bring it to your next appointment here (or you can bring the meter itself).  You can write it on any piece of paper.  please call us sooner if your blood sugar goes below 70, or if you have a lot of readings over 200. Please come back for a follow-up appointment in 2 months.

## 2019-08-22 NOTE — Patient Instructions (Addendum)
Please increase the Novolog to 60 units 3 times a day (just before each meal). Please continue the same metformin, Levemir, and Victoza.   check your blood sugar twice a day.  vary the time of day when you check, between before the 3 meals, and at bedtime.  also check if you have symptoms of your blood sugar being too high or too low.  please keep a record of the readings and bring it to your next appointment here (or you can bring the meter itself).  You can write it on any piece of paper.  please call us sooner if your blood sugar goes below 70, or if you have a lot of readings over 200. Please come back for a follow-up appointment in 2 months.

## 2019-08-22 NOTE — Patient Instructions (Addendum)
Medication Instructions:   START Torsemide 40 mg daily  INCREASE Potassium to 20 mEq daily  STOP Lasix   *If you need a refill on your cardiac medications before your next appointment, please call your pharmacy*  Lab Work: NONE ordered at this time of appointment   If you have labs (blood work) drawn today and your tests are completely normal, you will receive your results only by: [name] MyChart Message (if you have MyChart) OR . A paper copy in the mail If you have any lab test that is abnormal or we need to change your treatment, we will call you to review the results.  Testing/Procedures: NONE ordered at this time of appointment   Follow-Up: At [facility], you and your health needs are our priority.  As part of our continuing mission to provide you with exceptional heart care, we have created designated Provider Care Teams.  These Care Teams include your primary Cardiologist (physician) and Advanced Practice Providers (APPs -  Physician Assistants and Nurse Practitioners) who all work together to provide you with the care you need, when you need it.  We recommend signing up for the patient portal called "MyChart".  Sign up information is provided on this After Visit Summary.  MyChart is used to connect with patients for Virtual Visits (Telemedicine).  Patients are able to view lab/test results, encounter notes, upcoming appointments, etc.  Non-urgent messages can be sent to your provider as well.   To learn more about what you can do with MyChart, go to [email or web address].    Your next appointment:   4 day(s)  The format for your next appointment:   In Person  Provider:   Kerin Ransom, PA-C  Other Instructions  LIMIT FLUID INTAKE TO 8- 8 OZ CUPS A DAY  TRY TO DECREASE AND/OR LIMIT YOUR SODA INTAKE   WEIGH YOUR DAILY IN THE MORNINGS AT [place]. PLEASE NOTIFY OUR OFFICE IF YOU HAVE WEIGHT GAIN OF 3 LBS IN A DAY AND 5 LBS IN A WEEK.   READ THE  INFORMATION ON THE SALTY 6 AND THE LOW SODIUM DIET.   Low-Sodium Eating Plan Sodium, which is an element that makes up salt, helps you maintain a healthy balance of fluids in your body. Too much sodium can increase your blood pressure and cause fluid and waste to be held in your body. Your health care provider or dietitian may recommend following this plan if you have high blood pressure (hypertension), kidney disease, liver disease, or heart failure. Eating less sodium can help lower your blood pressure, reduce swelling, and protect your heart, liver, and kidneys. What are tips for following this plan? General guidelines  Most people on this plan should limit their sodium intake to 1,500-2,000 mg (milligrams) of sodium each day. Reading food labels   The Nutrition Facts label lists the amount of sodium in one serving of the food. If you eat more than one serving, you must multiply the listed amount of sodium by the number of servings.  Choose foods with less than 140 mg of sodium per serving.  Avoid foods with 300 mg of sodium or more per serving. Shopping  Look for lower-sodium products, often labeled as "low-sodium" or "no salt added."  Always check the sodium content even if foods are labeled as "unsalted" or "no salt added".  Buy fresh foods. ? Avoid canned foods and premade or frozen meals. ? Avoid canned, cured, or processed meats  Buy breads that have  less than 80 mg of sodium per slice. Cooking  Eat more home-cooked food and less restaurant, buffet, and fast food.  Avoid adding salt when cooking. Use salt-free seasonings or herbs instead of table salt or sea salt. Check with your health care provider or pharmacist before using salt substitutes.  Cook with plant-based oils, such as canola, sunflower, or olive oil. Meal planning  When eating at a restaurant, ask that your food be prepared with less salt or no salt, if possible.  Avoid foods that contain MSG (monosodium  glutamate). MSG is sometimes added to [place] food, bouillon, and some canned foods. What foods are recommended? The items listed may not be a complete list. Talk with your dietitian about what dietary choices are best for you. Grains Low-sodium cereals, including oats, puffed wheat and rice, and shredded wheat. Low-sodium crackers. Unsalted rice. Unsalted pasta. Low-sodium bread. Whole-grain breads and whole-grain pasta. Vegetables Fresh or frozen vegetables. "No salt added" canned vegetables. "No salt added" tomato sauce and paste. Low-sodium or reduced-sodium tomato and vegetable juice. Fruits Fresh, frozen, or canned fruit. Fruit juice. Meats and other protein foods Fresh or frozen (no salt added) meat, poultry, seafood, and fish. Low-sodium canned tuna and salmon. Unsalted nuts. Dried peas, beans, and lentils without added salt. Unsalted canned beans. Eggs. Unsalted nut butters. Dairy Milk. Soy milk. Cheese that is naturally low in sodium, such as ricotta cheese, fresh mozzarella, or Swiss cheese Low-sodium or reduced-sodium cheese. Cream cheese. Yogurt. Fats and oils Unsalted butter. Unsalted margarine with no trans fat. Vegetable oils such as canola or olive oils. Seasonings and other foods Fresh and dried herbs and spices. Salt-free seasonings. Low-sodium mustard and ketchup. Sodium-free salad dressing. Sodium-free light mayonnaise. Fresh or refrigerated horseradish. Lemon juice. Vinegar. Homemade, reduced-sodium, or low-sodium soups. Unsalted popcorn and pretzels. Low-salt or salt-free chips. What foods are not recommended? The items listed may not be a complete list. Talk with your dietitian about what dietary choices are best for you. Grains Instant hot cereals. Bread stuffing, pancake, and biscuit mixes. Croutons. Seasoned rice or pasta mixes. Noodle soup cups. Boxed or frozen macaroni and cheese. Regular salted crackers. Self-rising flour. Vegetables Sauerkraut, pickled vegetables,  and relishes. Olives. [place] fries. Onion rings. Regular canned vegetables (not low-sodium or reduced-sodium). Regular canned tomato sauce and paste (not low-sodium or reduced-sodium). Regular tomato and vegetable juice (not low-sodium or reduced-sodium). Frozen vegetables in sauces. Meats and other protein foods Meat or fish that is salted, canned, smoked, spiced, or pickled. Bacon, ham, sausage, hotdogs, corned beef, chipped beef, packaged lunch meats, salt pork, jerky, pickled herring, anchovies, regular canned tuna, sardines, salted nuts. Dairy Processed cheese and cheese spreads. Cheese curds. Blue cheese. Feta cheese. String cheese. Regular cottage cheese. Buttermilk. Canned milk. Fats and oils Salted butter. Regular margarine. Ghee. Bacon fat. Seasonings and other foods Onion salt, garlic salt, seasoned salt, table salt, and sea salt. Canned and packaged gravies. Worcestershire sauce. Tartar sauce. Barbecue sauce. Teriyaki sauce. Soy sauce, including reduced-sodium. Steak sauce. Fish sauce. Oyster sauce. Cocktail sauce. Horseradish that you find on the shelf. Regular ketchup and mustard. Meat flavorings and tenderizers. Bouillon cubes. Hot sauce and Tabasco sauce. Premade or packaged marinades. Premade or packaged taco seasonings. Relishes. Regular salad dressings. Salsa. Potato and tortilla chips. Corn chips and puffs. Salted popcorn and pretzels. Canned or dried soups. Pizza. Frozen entrees and pot pies. Summary  Eating less sodium can help lower your blood pressure, reduce swelling, and protect your heart, liver, and kidneys.  Most  people on this plan should limit their sodium intake to 1,500-2,000 mg (milligrams) of sodium each day.  Canned, boxed, and frozen foods are high in sodium. Restaurant foods, fast foods, and pizza are also very high in sodium. You also get sodium by adding salt to food.  Try to cook at home, eat more fresh fruits and vegetables, and eat less fast food, canned,  processed, or prepared foods. This information is not intended to replace advice given to you by your health care provider. Make sure you discuss any questions you have with your health care provider. Document Revised: 04/14/2017 Document Reviewed: 04/25/2016 Elsevier Patient Education  2020 [employer].

## 2019-08-22 NOTE — Telephone Encounter (Signed)
New message  Patient is calling in to have wife approved to accompany him to his appointment today with Roby Lofts. States that he is feeling very weak and needs his wife to be there with him due to how he is feeling. States that with the SOB and chest pain he has been having lately, it leaves him feeling weak. Please give patient a call to confirm.

## 2019-08-26 ENCOUNTER — Other Ambulatory Visit: Payer: Self-pay

## 2019-08-26 ENCOUNTER — Ambulatory Visit (INDEPENDENT_AMBULATORY_CARE_PROVIDER_SITE_OTHER): Payer: 59 | Admitting: Cardiology

## 2019-08-26 ENCOUNTER — Encounter: Payer: Self-pay | Admitting: Cardiology

## 2019-08-26 VITALS — BP 132/66 | HR 73 | Ht 75.0 in | Wt 263.6 lb

## 2019-08-26 DIAGNOSIS — R21 Rash and other nonspecific skin eruption: Secondary | ICD-10-CM

## 2019-08-26 DIAGNOSIS — I5033 Acute on chronic diastolic (congestive) heart failure: Secondary | ICD-10-CM

## 2019-08-26 DIAGNOSIS — J432 Centrilobular emphysema: Secondary | ICD-10-CM

## 2019-08-26 DIAGNOSIS — I480 Paroxysmal atrial fibrillation: Secondary | ICD-10-CM

## 2019-08-26 DIAGNOSIS — Z79899 Other long term (current) drug therapy: Secondary | ICD-10-CM

## 2019-08-26 DIAGNOSIS — I10 Essential (primary) hypertension: Secondary | ICD-10-CM

## 2019-08-26 DIAGNOSIS — G629 Polyneuropathy, unspecified: Secondary | ICD-10-CM

## 2019-08-26 DIAGNOSIS — G4733 Obstructive sleep apnea (adult) (pediatric): Secondary | ICD-10-CM

## 2019-08-26 DIAGNOSIS — I5032 Chronic diastolic (congestive) heart failure: Secondary | ICD-10-CM | POA: Insufficient documentation

## 2019-08-26 DIAGNOSIS — Z794 Long term (current) use of insulin: Secondary | ICD-10-CM | POA: Insufficient documentation

## 2019-08-26 DIAGNOSIS — E119 Type 2 diabetes mellitus without complications: Secondary | ICD-10-CM

## 2019-08-26 DIAGNOSIS — E669 Obesity, unspecified: Secondary | ICD-10-CM

## 2019-08-26 LAB — BASIC METABOLIC PANEL
BUN/Creatinine Ratio: 20 (ref 10–24)
BUN: 20 mg/dL (ref 8–27)
CO2: 27 mmol/L (ref 20–29)
Calcium: 9.6 mg/dL (ref 8.6–10.2)
Chloride: 99 mmol/L (ref 96–106)
Creatinine, Ser: 1.01 mg/dL (ref 0.76–1.27)
GFR calc Af Amer: 92 mL/min/{1.73_m2} (ref >59)
GFR calc non Af Amer: 80 mL/min/{1.73_m2} (ref >59)
Glucose: 219 mg/dL — ABNORMAL HIGH (ref 65–99)
Potassium: 4 mmol/L (ref 3.5–5.2)
Sodium: 143 mmol/L (ref 134–144)

## 2019-08-26 NOTE — Progress Notes (Signed)
Cardiology Office Note:    Date:  08/26/2019   ID:  [name], DOB [birth year], MRN [id number]  PCP:  [facility]  Cardiologist:  Minus Breeding, MD  Electrophysiologist:  None   Referring MD: Pa, [facility]*   CC: rash  History of Present Illness:    [name] is a 64 y.o. male with a hx of atrial fibrillation and atrial flutter. He is s/p flutter ablation in 2014 and has actually done well from this standpoint. He had a cath in May 2018 that showed widely patent coronaries and normal LVF with moderate LVH. Echo done at [facility] in Oct 2020 showed EF 60-65%, AOV sclerosis, mild-moderate TR, mod LAE, normal RVF.  He does have significant COPD and is followed by Dr Elsworth Soho. Other medical problems include sleep apnea (intolerant to C-pap), HTN,chronic back pain,normal coronaries in 2018, andIDDM LE neuropathy.  He was seen by Dr Percival Spanish 07/29/2019 with c/o weight gain, LE edema, and SOB, suspected Rt heart failure.  His weight then was 273 lbs.  I saw him in follow up 08/07/2019 and his weight was down to 261.8 lbs and his SOB and edema were improved.  He did have an echo 08/04/2019 that showed showed an EF of 60-65%, moderate LVH, G2DD, no RWMA, elevated LA pressures, moderately elevated PA pressures, severe LAE, moderate RAE, mild MR, and mild-moderate TR. Based on his recent echocardiogram he was recommended to undergo a PYP scan to evaluate for amyloidosis, currently scheduled for 09/06/19.  He presented again to the office 08/22/2019 with c/o worsening DOE and edema.  His weight was up to 276 lbs.  On interview it became clear he was not monitoring his dietary sodium. His Lasix was changed to torsemide and he is seen today for follow up.  He presents to the office accompanied by his wife. He feels better-less SOB and less edema.  His weight is down to 263.6 lbs. He has several other complaints.  He has chronic back pain, worse at night.  He tells me he has seen Dr  Arnoldo Morale for this, "nothing they can do".  He also c/o of pruritic rash on his abdomin and back.  He thinks its from his insulin which was started this summer-he's had this for > 9 months. He tells me Dr Loanne Drilling doesn't think so and the patient has an appointment with a dermatologist in a week.   Past Medical History:  Diagnosis Date  . Arthritis    lower back  . Asthma   . Atrial flutter ([place])    s/p CTI ablation by Dr Rayann Heman  . Brain aneurysm 2009   questionable. A follow up CTA in 2009 showed no evidence of  . Chronic back pain    DDD/stenosis  . Colon polyps    9 polyps removed 10/13/11  . Complication of anesthesia September 11, 2012   slow to awaken after ablation  . COPD (chronic obstructive pulmonary disease) ([place])   . Diabetes mellitus    takes Metformin and Glimepiride daily  . Emphysema   . GERD (gastroesophageal reflux disease)    takes Omeprazole bid  . History of shingles   . HLD (hyperlipidemia)    takes Pravastatin daily  . HTN (hypertension)    takes Prinizide daily  . Obesity   . OSA (obstructive sleep apnea)    not always using cpap  . Overdose 2009   unintentional Flecanide overdose  . Peripheral neuropathy   . Short-term memory  loss   . Tobacco abuse     Past Surgical History:  Procedure Laterality Date  . ANTERIOR CERVICAL DECOMP/DISCECTOMY FUSION  01/04/2012   Procedure: ANTERIOR CERVICAL DECOMPRESSION/DISCECTOMY FUSION 1 LEVEL/HARDWARE REMOVAL;  Surgeon: Ophelia Charter, MD;  Location: [facility] NEURO ORS;  Service: Neurosurgery;  Laterality: N/A;  explore cervical fusion Cervical six - seven  with removal of codman plate anterior cervical decompression with fusion interbody prothesis plating and bonegraft  . APPENDECTOMY  2-66yrs ago  . ATRIAL FIBRILLATION ABLATION N/A 09/11/2012   PT DID NOT HAVE AN ATRIAL FIBRILLATION ABLATION IN 2014!  ATRIAL FLUTTER ABLATION ONLY  . ATRIAL FLUTTER ABLATION  09/11/2012   CTI ablation by Dr Rayann Heman  . CARDIAC CATHETERIZATION   2008   no significant CAD  . CARDIOVERSION  05/05/2011   Procedure: CARDIOVERSION;  Surgeon: Lelon Perla, MD;  Location: [facility] ENDOSCOPY;  Service: Cardiovascular;  Laterality: N/A;  . CARDIOVERSION Bilateral 07/26/2012   Procedure: CARDIOVERSION;  Surgeon: Minus Breeding, MD;  Location: [facility] ENDOSCOPY;  Service: Cardiovascular;  Laterality: Bilateral;  . CARPAL TUNNEL RELEASE  99/2000   bilateral  . COLONOSCOPY WITH PROPOFOL N/A 12/13/2012   Procedure: COLONOSCOPY WITH PROPOFOL;  Surgeon: Milus Banister, MD;  Location: [facility] ENDOSCOPY;  Service: Endoscopy;  Laterality: N/A;  . ELECTROPHYSIOLOGIC STUDY N/A 04/21/2015   Procedure: Atrial Fibrillation Ablation;  Surgeon: Thompson Grayer, MD;  Location: [place] CV LAB;  Service: Cardiovascular;  Laterality: N/A;  . HERNIA REPAIR    . KNEE SURGERY  6-17yrs ago   left  . LEFT HEART CATH AND CORONARY ANGIOGRAPHY N/A 09/19/2016   Procedure: Left Heart Cath and Coronary Angiography;  Surgeon: Belva Crome, MD;  Location: [place] CV LAB;  Service: Cardiovascular;  Laterality: N/A;  . Left inguinal hernia repair     as a child  . NASAL SEPTOPLASTY W/ TURBINOPLASTY Bilateral 02/19/2014   Procedure: NASAL SEPTOPLASTY WITH BILATERAL TURBINATE REDUCTION;  Surgeon: Jodi Marble, MD;  Location: [facility];  Service: ENT;  Laterality: Bilateral;  . NECK SURGERY  42yrs ago  . right shoulder surgery  4-69yrs ago   cyst removed  . TEE WITHOUT CARDIOVERSION  05/05/2011   Procedure: TRANSESOPHAGEAL ECHOCARDIOGRAM (TEE);  Surgeon: Lelon Perla, MD;  Location: [facility] ENDOSCOPY;  Service: Cardiovascular;  Laterality: N/A;  . TEE WITHOUT CARDIOVERSION N/A 04/21/2015   Procedure: TRANSESOPHAGEAL ECHOCARDIOGRAM (TEE);  Surgeon: Sueanne Margarita, MD;  Location: [facility] ENDOSCOPY;  Service: Cardiovascular;  Laterality: N/A;  . THROAT SURGERY  4-4yrs ago   "thought " it was cancer but came back not  . TONSILLECTOMY      Current Medications: Current Meds  Medication Sig  .  albuterol (PROAIR HFA) 108 (90 Base) MCG/ACT inhaler Inhale 1-2 puffs into the lungs every 6 (six) hours as needed for wheezing or shortness of breath.  . bisoprolol (ZEBETA) 10 MG tablet Take 10 mg by mouth daily.  . Cholecalciferol (VITAMIN D) 2000 UNITS CAPS Take 2,000 Units by mouth daily.   [name] diltiazem (CARDIZEM CD) 120 MG 24 hr capsule TAKE 1 CAPSULE BY MOUTH DAILY  . gabapentin (NEURONTIN) 100 MG capsule Take 100 mg by mouth 2 (two) times daily.  [name] HYDROcodone-acetaminophen (NORCO) 7.5-325 MG tablet Take 1 tablet by mouth every 6 (six) hours as needed for moderate pain.  . Insulin Aspart FlexPen 100 UNIT/ML SOPN Inject 60 Units into the skin 3 (three) times daily with meals.  . Insulin Detemir (LEVEMIR FLEXTOUCH) 100 UNIT/ML Pen Inject 80 Units into  the skin daily with breakfast.  . Insulin Pen Needle (PEN NEEDLES) 30G X 8 MM MISC 1 each by Does not apply route 5 (five) times daily. E11.9  . liraglutide (VICTOZA) 18 MG/3ML SOPN Inject 0.3 mLs (1.8 mg total) into the skin daily.  [name] losartan (COZAAR) 50 MG tablet Take 1 tablet (50 mg total) by mouth daily.  . metFORMIN (GLUCOPHAGE-XR) 500 MG 24 hr tablet Take 1 tablet (500 mg total) by mouth 2 (two) times daily.  . Multiple Vitamin (MULTIVITAMIN WITH MINERALS) TABS Take 1 tablet by mouth daily.  . nitroGLYCERIN (NITROSTAT) 0.4 MG SL tablet Place 1 tablet (0.4 mg total) under the tongue every 5 (five) minutes x 3 doses as needed for chest pain.  [name] omeprazole (PRILOSEC) 20 MG capsule Take 20 mg by mouth at bedtime.   . potassium chloride SA (KLOR-CON M20) 20 MEQ tablet Take 1 tablet (20 mEq total) by mouth daily.  . pravastatin (PRAVACHOL) 80 MG tablet TAKE 1 TABLET BY MOUTH DAILY AT BEDTIME (Patient taking differently: Take 40 mg by mouth daily. )  . sildenafil (REVATIO) 20 MG tablet Take 20-80 mg by mouth daily as needed (erectile dysfunction).  . torsemide (DEMADEX) 20 MG tablet Take 2 tablets (40 mg total) by mouth 2 (two) times daily.  [name]  umeclidinium-vilanterol (ANORO ELLIPTA) 62.5-25 MCG/INH AEPB Inhale 1 puff into the lungs daily.  . Vitamin D, Ergocalciferol, (DRISDOL) 1.25 MG (50000 UT) CAPS capsule Take 50,000 Units by mouth every 7 (seven) days.  [name] warfarin (COUMADIN) 5 MG tablet TAKE 1 TO 1 & 1/2 TABLETS BY MOUTH DAILYAS NEEDED AND AS DIRECTED BY THE COUMADIN CLINIC     Allergies:   Adhesive [tape] and Latex   Social History   Socioeconomic History  . Marital status: Married    Spouse name: Not on file  . Number of children: 2  . Years of education: Not on file  . Highest education level: Not on file  Occupational History  . Occupation: Dealer   Tobacco Use  . Smoking status: Current Every Day Smoker    Packs/day: 1.00    Years: 49.00    Pack years: 49.00    Types: Cigarettes  . Smokeless tobacco: Never Used  . Tobacco comment: He is going to try 1-800-QUIT Now for nicotine replacement therapy  Substance and Sexual Activity  . Alcohol use: Yes    Alcohol/week: 0.0 standard drinks    Comment: 12/05/17 pt stated he has drinked 1/2 drink in 3 years  . Drug use: No  . Sexual activity: Yes  Other Topics Concern  . Not on file  Social History Narrative   Daily caffeine(Mountain Dew)       Lives in [place] with spouse.   Unemployed due to chronic back/ leg pain         Social Determinants of Health   Financial Resource Strain:   . Difficulty of Paying Living Expenses:   Food Insecurity:   . Worried About Charity fundraiser in the Last Year:   . Arboriculturist in the Last Year:   Transportation Needs:   . Film/video editor (Medical):   [name] Lack of Transportation (Non-Medical):   Physical Activity:   . Days of Exercise per Week:   . Minutes of Exercise per Session:   Stress:   . Feeling of Stress :   Social Connections:   . Frequency of Communication with Friends and Family:   . Frequency of Social  Gatherings with Friends and Family:   . Attends Religious Services:   . Active  Member of Clubs or Organizations:   . Attends Archivist Meetings:   [name] Marital Status:      Family History: The patient's family history includes Diabetes in his maternal grandmother and mother; Heart disease in his father; Lung cancer in his father. There is no history of Colon cancer, Anesthesia problems, Hypotension, Malignant hyperthermia, or Pseudochol deficiency.  ROS:   Please see the history of present illness.     All other systems reviewed and are negative.  EKGs/Labs/Other Studies Reviewed:    The following studies were reviewed today: Echo 08/04/2019  EKG:  EKG is not ordered today.  The ekg ordered 07/29/2019 demonstrates NSR-73  Recent Labs: 07/29/2019: BNP 102.1; TSH 4.330 08/14/2019: BUN 15; Creatinine, Ser 0.85; Potassium 4.2; Sodium 139  Recent Lipid Panel    Component Value Date/Time   CHOL 122 08/18/2017 0910   TRIG 400 (H) 08/18/2017 0910   HDL 29 (L) 08/18/2017 0910   CHOLHDL 4.2 08/18/2017 0910   CHOLHDL 5.3 08/27/2016 0235   VLDL 66 (H) 08/27/2016 0235   LDLCALC 13 08/18/2017 0910   LDLDIRECT 99.9 09/08/2011 0843    Physical Exam:    VS:  BP 132/66   Pulse 73   Ht 6\' 3"  (1.905 m)   Wt 263 lb 9.6 oz (119.6 kg)   SpO2 95%   BMI 32.95 kg/m     Wt Readings from Last 3 Encounters:  08/26/19 263 lb 9.6 oz (119.6 kg)  08/22/19 275 lb (124.7 kg)  08/22/19 276 lb (125.2 kg)     GEN:  Overweight Caucasian male, in no acute distress HEENT: Normal NECK: No JVD; No carotid bruits CARDIAC: RRR, 2/6 systolic murmur AOV, no rubs, gallops RESPIRATORY:  Bilateral expiratory wheezing ABDOMEN: Obese, non tender MUSCULOSKELETAL:  Trace LE edema; No deformity  SKIN: papular rash patches on abdomin noted NEUROLOGIC:  Alert and oriented x 3 PSYCHIATRIC:  Normal affect   ASSESSMENT:    Acute on chronic Rt heart failure- Improved with change to Torsemide and more strict low sodium diet. - check f/u BMP today  COPD (chronic obstructive pulmonary  disease) ([place]) Followed by Dr Elsworth Soho- wheezing on exam but he says this is normal for him.  He has inhalers but doesn't use them.   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 I told him I thought it was unlikely his rash was from his insulin and encouraged him to f/u with the dermatologist appointment.   PAF (paroxysmal atrial fibrillation) (HCC) Holding NSR by recent EKGs  Long term current use of anticoagulant therapy Coumadin for PAF  Normal coronary arteries Cath 09/19/16 for chest pain   PLAN:    He is improved- will check BMP.  F/U in 3 months.   Medication Adjustments/Labs and Tests Ordered: Current medicines are reviewed at length with the patient today.  Concerns regarding medicines are outlined above.  Orders Placed This Encounter  Procedures  . Basic metabolic panel   No orders of the defined types were placed in this encounter.   Patient Instructions  Medication Instructions:  Your Physician recommend you continue on your current medication as directed.    *If you need a refill on your cardiac medications before your next appointment, please call your pharmacy*   Lab Work: Your physician recommends that you return for lab work today ( BMP)  If you have labs (blood work)  drawn today and your tests are completely normal, you will receive your results only by: [name] MyChart Message (if you have MyChart) OR . A paper copy in the mail If you have any lab test that is abnormal or we need to change your treatment, we will call you to review the results.   Testing/Procedures: None   Follow-Up: At [facility], you and your health needs are our priority.  As part of our continuing mission to provide you with exceptional heart care, we have created designated Provider Care Teams.  These Care Teams include your primary Cardiologist (physician) and Advanced Practice Providers (APPs -  Physician Assistants and Nurse  Practitioners) who all work together to provide you with the care you need, when you need it.  We recommend signing up for the patient portal called "MyChart".  Sign up information is provided on this After Visit Summary.  MyChart is used to connect with patients for Virtual Visits (Telemedicine).  Patients are able to view lab/test results, encounter notes, upcoming appointments, etc.  Non-urgent messages can be sent to your provider as well.   To learn more about what you can do with MyChart, go to [email or web address].    Your next appointment:   3 month(s)  The format for your next appointment:   In Person  Provider:   Kerin Ransom, PA      Signed, Kerin Ransom, PA-C  08/26/2019 9:00 AM    [place]

## 2019-08-26 NOTE — Patient Instructions (Signed)
Medication Instructions:  Your Physician recommend you continue on your current medication as directed.    *If you need a refill on your cardiac medications before your next appointment, please call your pharmacy*   Lab Work: Your physician recommends that you return for lab work today ( BMP)  If you have labs (blood work) drawn today and your tests are completely normal, you will receive your results only by: [name] MyChart Message (if you have MyChart) OR . A paper copy in the mail If you have any lab test that is abnormal or we need to change your treatment, we will call you to review the results.   Testing/Procedures: None   Follow-Up: At [facility], you and your health needs are our priority.  As part of our continuing mission to provide you with exceptional heart care, we have created designated Provider Care Teams.  These Care Teams include your primary Cardiologist (physician) and Advanced Practice Providers (APPs -  Physician Assistants and Nurse Practitioners) who all work together to provide you with the care you need, when you need it.  We recommend signing up for the patient portal called "MyChart".  Sign up information is provided on this After Visit Summary.  MyChart is used to connect with patients for Virtual Visits (Telemedicine).  Patients are able to view lab/test results, encounter notes, upcoming appointments, etc.  Non-urgent messages can be sent to your provider as well.   To learn more about what you can do with MyChart, go to [email or web address].    Your next appointment:   3 month(s)  The format for your next appointment:   In Person  Provider:   Kerin Ransom, [place]

## 2019-09-03 ENCOUNTER — Telehealth: Payer: Self-pay | Admitting: Endocrinology

## 2019-09-03 NOTE — Telephone Encounter (Signed)
Patient called stating pharmacy did not receive the Aspart Flex pen sent on 08/22/19 - requesting it to be resent.   [facility] - [place] - [address] Phone:  [phone]  Fax:  [phone]

## 2019-09-04 ENCOUNTER — Telehealth (HOSPITAL_COMMUNITY): Payer: Self-pay

## 2019-09-04 ENCOUNTER — Telehealth: Payer: Self-pay | Admitting: Cardiology

## 2019-09-04 ENCOUNTER — Other Ambulatory Visit: Payer: Self-pay

## 2019-09-04 DIAGNOSIS — E119 Type 2 diabetes mellitus without complications: Secondary | ICD-10-CM

## 2019-09-04 MED ORDER — INSULIN ASPART FLEXPEN 100 UNIT/ML ~~LOC~~ SOPN: 60.0000 [IU] | PEN_INJECTOR | Freq: Three times a day (TID) | SUBCUTANEOUS | 0 refills | Status: DC

## 2019-09-04 NOTE — Telephone Encounter (Signed)
OK to take 20 meq and get a BMET in 10 days.

## 2019-09-04 NOTE — Telephone Encounter (Signed)
Outpatient Medication Detail   Disp Refills Start End   Insulin Aspart FlexPen 100 UNIT/ML SOPN 162 mL 0 09/04/2019    Sig - Route: Inject 60 Units into the skin 3 (three) times daily with meals. - Subcutaneous   Sent to pharmacy as: Insulin Aspart FlexPen 100 UNIT/ML Solution Pen-injector   E-Prescribing Status: Receipt confirmed by pharmacy (09/04/2019  7:51 AM EDT)

## 2019-09-04 NOTE — Telephone Encounter (Signed)
Left message to call office. Potassium was increased at 4/8 office visit and prescription was sent to the pharmacy at that time.

## 2019-09-04 NOTE — Telephone Encounter (Signed)
He is already taking 20 meq daily

## 2019-09-04 NOTE — Telephone Encounter (Signed)
Pt c/o medication issue:  1. Name of Medication: potassium chloride SA (KLOR-CON M20) 20 MEQ tablet  2. How are you currently taking this medication (dosage and times per day)? As directed  3. Are you having a reaction (difficulty breathing--STAT)? No   4. What is your medication issue? Patient states that 2 weeks ago Dr. Percival Spanish was supposed to up the dosage on his Potassium. He states that a new prescription was sent in but had the same dosage. He went and saw his PCP today and they stated that he did need a higher dose of this medication. Please advise.

## 2019-09-04 NOTE — Telephone Encounter (Signed)
OK.  Still get the BMET in 10 days.

## 2019-09-04 NOTE — Telephone Encounter (Signed)
Patient returning call. Transferred call to the nurse. 

## 2019-09-04 NOTE — Telephone Encounter (Signed)
Encounter complete. 

## 2019-09-04 NOTE — Telephone Encounter (Signed)
I spoke with patient. Our records indicate prior to 4/8 office visit patient was taking potassium 10 meq daily.  Patient reports for the last year he has been taking potassium 20 meq daily. Did not make any changes recently. I explained to patient lab work on 08/26/19 showed potassium was in normal range. Patient confirms he was taking 20 meq potassium daily when lab work was done.  He reports he has recently started having cramps in arms and legs. He thinks potassium dose needs to be increased.  Current weight is staying around 252 lbs. Swelling has improved. He is always thirsty and is wondering if he is dehydrated.  Will forward to Dr Percival Spanish to see if lab work should be rechecked.  Patient will be in office for test on 4/23.

## 2019-09-05 ENCOUNTER — Encounter: Payer: Self-pay | Admitting: Physician Assistant

## 2019-09-05 ENCOUNTER — Ambulatory Visit (INDEPENDENT_AMBULATORY_CARE_PROVIDER_SITE_OTHER): Payer: 59 | Admitting: Physician Assistant

## 2019-09-05 ENCOUNTER — Other Ambulatory Visit: Payer: Self-pay

## 2019-09-05 DIAGNOSIS — D485 Neoplasm of uncertain behavior of skin: Secondary | ICD-10-CM | POA: Diagnosis not present

## 2019-09-05 DIAGNOSIS — D492 Neoplasm of unspecified behavior of bone, soft tissue, and skin: Secondary | ICD-10-CM

## 2019-09-05 DIAGNOSIS — L309 Dermatitis, unspecified: Secondary | ICD-10-CM | POA: Diagnosis not present

## 2019-09-05 DIAGNOSIS — L570 Actinic keratosis: Secondary | ICD-10-CM | POA: Diagnosis not present

## 2019-09-05 LAB — POCT SKIN KOH

## 2019-09-05 NOTE — Progress Notes (Signed)
Will get records release from Dr. Allyson Sabal for pathology report. Per [place] patient has been prescribed Triamcinolone 0.1% cream, Mupirocin 2% ointment, ciclopirox cream, Clobetasol 0.05% cream, Clotrimazole/Betamethasone cream. Biopsy done at Dr. 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

## 2019-09-05 NOTE — Progress Notes (Signed)
In the past Dr Allyson Sabal did in iections in his arms and hands. No other provider will do this because of his sugar diabetes.

## 2019-09-05 NOTE — Progress Notes (Signed)
NEW patient visit  Subjective  [name] is a 64 y.o. male who presents for the following: . He was seen by Dr. Allyson Sabal and Derm at [facility]. He has had multiple creams. Has been on top right hand and forearm for 3 years. He also has had a spot on right thigh which seemed to clear with cream. He cant remember any of the creams. We will call pharmacy. See nurse note. His hand doesn't itch but it is sensitive. He feels like it was biopsied at Dr. Ledell Peoples office. He thinks it was a fungus but he's not sure.  He has also recently had whelps and linear raised areas around waist and thighs and itched so he took [facility] and that helped. He also has a warty growth on the right cheek that has been present over a year. It is not sore but comes off and grows back again.  Objective  Well appearing patient in no apparent distress; mood and affect are within normal limits.  A focused examination was performed including face, back arms.. Relevant physical exam findings are noted in the Assessment and Plan. No suspicious moles noted on back.  Objective  Left Superior Helix (3): Erythematous patches with gritty scale.  Objective  Right Hand - Posterior: Erythematous granulomatous looking plaque dorsum of right hand and right inner forearm. Hand has slight scale around the edge.  Images      Objective  Right Malar Cheek: Crusted papule     Objective  Right Forearm - Anterior: Erythematous granulomatous dermatitis     Objective  Right Wrist - Posterior: Erythematous granulomatous dermatitis     Assessment & Plan  AK (actinic keratosis) (3) Left Superior Helix  Destruction of lesion - Left Superior Helix Complexity: simple   Destruction method: cryotherapy   Informed consent: discussed and consent obtained   Timeout:  patient name, date of birth, surgical site, and procedure verified Lesion destroyed using liquid nitrogen: Yes   Outcome: patient tolerated procedure well with  no complications    Dermatitis Right Hand - Posterior  I did a KOH which was negative. I am going to do a deep shave of the right forearm and the right hand to attempt to rule out sarcoidosis. Previous biopsy from [facility]ie Odor PA showed GA but couldn't rule out sarcoidosis.  POCT Skin KOH - Right Hand - Posterior  Neoplasm of skin (3) Right Malar Cheek  Skin / nail biopsy Type of biopsy: tangential   Procedure prep:  Patient was prepped and draped in usual sterile fashion (Non sterile) Prep type:  Chlorhexidine Anesthesia: the lesion was anesthetized in a standard fashion   Anesthetic:  1% lidocaine w/ epinephrine 1-100,000 local infiltration Instrument used: scissors    Specimen 1 - Surgical pathology Differential Diagnosis:  Check Margins: No  Right Forearm - Anterior  Skin / nail biopsy Type of biopsy: tangential   Procedure prep:  Patient was prepped and draped in usual sterile fashion (Non sterile) Prep type:  Chlorhexidine Anesthesia: the lesion was anesthetized in a standard fashion   Anesthetic:  1% lidocaine w/ epinephrine 1-100,000 local infiltration Instrument used: flexible razor blade    Specimen 2 - Surgical pathology Differential Diagnosis:  Check Margins: No  Right Wrist - Posterior  Skin / nail biopsy Type of biopsy: tangential   Procedure prep:  Patient was prepped and draped in usual sterile fashion (Non sterile) Prep type:  Chlorhexidine Anesthesia: the lesion was anesthetized in a standard fashion   Anesthetic:  1% lidocaine w/ epinephrine 1-100,000 local infiltration Instrument used: flexible razor blade    Specimen 3 - Surgical pathology Differential Diagnosis:  Check Margins: No  AK (actinic keratosis) Left Superior Helix  Destruction of lesion - Left Superior Helix Complexity: simple   Destruction method: cryotherapy   Informed consent: discussed and consent obtained   Timeout:  patient name, date of birth, surgical site, and  procedure verified Lesion destroyed using liquid nitrogen: Yes   Outcome: patient tolerated procedure well with no complications    Dermatitis Right Hand - Posterior  I did a KOH which was negative. I am going to do a deep shave of the right forearm and the right hand to attempt to rule out sarcoidosis. Previous biopsy from Lennie Odor PA showed GA but couldn't rule out sarcoidosis.  POCT Skin KOH - Right Hand - Posterior

## 2019-09-05 NOTE — Patient Instructions (Signed)

## 2019-09-06 ENCOUNTER — Ambulatory Visit (HOSPITAL_COMMUNITY)
Admission: RE | Admit: 2019-09-06 | Discharge: 2019-09-06 | Disposition: A | Discharge status: Home / Self Care | Payer: 59 | Source: Ambulatory Visit | Attending: Cardiovascular Disease | Admitting: Cardiovascular Disease

## 2019-09-06 ENCOUNTER — Other Ambulatory Visit: Payer: Self-pay

## 2019-09-06 DIAGNOSIS — Z79899 Other long term (current) drug therapy: Secondary | ICD-10-CM

## 2019-09-06 DIAGNOSIS — I517 Cardiomegaly: Secondary | ICD-10-CM | POA: Diagnosis not present

## 2019-09-06 MED ORDER — TECHNETIUM TC 99M PYROPHOSPHATE
19.8000 | Freq: Once | INTRAVENOUS | Status: AC
Start: 2019-09-06 — End: 2019-09-06
  Administered 2019-09-06: 19.8 via INTRAVENOUS

## 2019-09-06 MED ORDER — TORSEMIDE 20 MG PO TABS: 40.0000 mg | ORAL_TABLET | Freq: Every day | ORAL | 3 refills | Status: DC

## 2019-09-06 NOTE — Progress Notes (Signed)
Per Dr. Percival Spanish:  Can we tell him to reduce his Torsemide to once daily. Looks like he is taking it twice daily. 40 mg once daily.  Can we draw a BMET today.     Orders placed, medlist updated.

## 2019-09-07 LAB — BASIC METABOLIC PANEL
BUN/Creatinine Ratio: 18 (ref 10–24)
BUN: 22 mg/dL (ref 8–27)
CO2: 31 mmol/L — ABNORMAL HIGH (ref 20–29)
Calcium: 9.6 mg/dL (ref 8.6–10.2)
Chloride: 100 mmol/L (ref 96–106)
Creatinine, Ser: 1.22 mg/dL (ref 0.76–1.27)
GFR calc Af Amer: 74 mL/min/{1.73_m2} (ref >59)
GFR calc non Af Amer: 64 mL/min/{1.73_m2} (ref >59)
Glucose: 77 mg/dL (ref 65–99)
Potassium: 4 mmol/L (ref 3.5–5.2)
Sodium: 145 mmol/L — ABNORMAL HIGH (ref 134–144)

## 2019-09-09 ENCOUNTER — Telehealth: Payer: Self-pay | Admitting: Cardiology

## 2019-09-09 NOTE — Telephone Encounter (Signed)
He can continue to take the 40 mg Torsemide.  His creat and potassium were OK late last week.  We reduced the dose because of dizziness.  He will need to perhaps try to tolerate this and make sure to change positions slowly and be prepared to sit down as needed.

## 2019-09-09 NOTE — Telephone Encounter (Signed)
[name] is calling requesting a triage nurse give him a call to schedule an appointment with Dr. Percival Spanish due his first available on 09/20/19 not being soon enough for him. Please advise.

## 2019-09-09 NOTE — Telephone Encounter (Signed)
Spoke with patient and yesterday he was having a hard time catching is breath and weakness Per patient he had increased swelling and weight gain.  He does not weigh himself daily or check his blood pressure  O2 sat yesterday down to 92%, this am 96% Patient did increase his Torsemide back to 4 daily  He does continue to have shortness of breath but states not as bad as yesterday  Friday and Saturday felt good Advised patient to weigh himself daily each morning after urinates and to start monitoring his blood pressure at home.  Will forward to Dr Percival Spanish for review

## 2019-09-11 ENCOUNTER — Ambulatory Visit (INDEPENDENT_AMBULATORY_CARE_PROVIDER_SITE_OTHER): Payer: 59 | Admitting: *Deleted

## 2019-09-11 ENCOUNTER — Other Ambulatory Visit: Payer: Self-pay

## 2019-09-11 DIAGNOSIS — Z7901 Long term (current) use of anticoagulants: Secondary | ICD-10-CM

## 2019-09-11 DIAGNOSIS — I4892 Unspecified atrial flutter: Secondary | ICD-10-CM

## 2019-09-11 DIAGNOSIS — I480 Paroxysmal atrial fibrillation: Secondary | ICD-10-CM

## 2019-09-11 LAB — POCT INR: INR: 4.8 — AB (ref 2.0–3.0)

## 2019-09-11 NOTE — Patient Instructions (Signed)
Description   Do not take Warfarin today and No Warfarin tomorrow then continue taking 1.5 tablets daily except 1 tablet on Mondays, Wednesdays  and Fridays. Repeat INR in 2 weeks. [facility] [phone]

## 2019-09-11 NOTE — Telephone Encounter (Signed)
Left message to call back  

## 2019-09-16 ENCOUNTER — Telehealth: Payer: Self-pay | Admitting: Cardiology

## 2019-09-16 ENCOUNTER — Ambulatory Visit: Payer: Self-pay | Admitting: Podiatrist

## 2019-09-16 ENCOUNTER — Telehealth: Payer: Self-pay | Admitting: *Deleted

## 2019-09-16 ENCOUNTER — Other Ambulatory Visit: Payer: Self-pay

## 2019-09-16 ENCOUNTER — Ambulatory Visit (INDEPENDENT_AMBULATORY_CARE_PROVIDER_SITE_OTHER): Payer: 59 | Admitting: Student

## 2019-09-16 ENCOUNTER — Encounter: Payer: Self-pay | Admitting: Student

## 2019-09-16 VITALS — BP 142/72 | HR 89 | Temp 97.1°F | Ht 75.0 in | Wt 266.2 lb

## 2019-09-16 DIAGNOSIS — I10 Essential (primary) hypertension: Secondary | ICD-10-CM

## 2019-09-16 DIAGNOSIS — I5033 Acute on chronic diastolic (congestive) heart failure: Secondary | ICD-10-CM

## 2019-09-16 DIAGNOSIS — J449 Chronic obstructive pulmonary disease, unspecified: Secondary | ICD-10-CM

## 2019-09-16 DIAGNOSIS — I4892 Unspecified atrial flutter: Secondary | ICD-10-CM

## 2019-09-16 DIAGNOSIS — R0789 Other chest pain: Secondary | ICD-10-CM

## 2019-09-16 DIAGNOSIS — I480 Paroxysmal atrial fibrillation: Secondary | ICD-10-CM

## 2019-09-16 DIAGNOSIS — E669 Obesity, unspecified: Secondary | ICD-10-CM

## 2019-09-16 DIAGNOSIS — Z79899 Other long term (current) drug therapy: Secondary | ICD-10-CM

## 2019-09-16 DIAGNOSIS — R0989 Other specified symptoms and signs involving the circulatory and respiratory systems: Secondary | ICD-10-CM

## 2019-09-16 DIAGNOSIS — E785 Hyperlipidemia, unspecified: Secondary | ICD-10-CM

## 2019-09-16 DIAGNOSIS — E1169 Type 2 diabetes mellitus with other specified complication: Secondary | ICD-10-CM

## 2019-09-16 DIAGNOSIS — Z720 Tobacco use: Secondary | ICD-10-CM

## 2019-09-16 NOTE — Patient Instructions (Addendum)
Medication Instructions:  Your Physician recommend you continue on your current medication as directed.    *If you need a refill on your cardiac medications before your next appointment, please call your pharmacy*   Lab Work: Your physician recommends that you return for lab work today ( BNP, BMP, CBC)  If you have labs (blood work) drawn today and your tests are completely normal, you will receive your results only by: [name] MyChart Message (if you have MyChart) OR . A paper copy in the mail If you have any lab test that is abnormal or we need to change your treatment, we will call you to review the results.   Testing/Procedures: Your physician has requested that you have a carotid duplex. This test is an ultrasound of the carotid arteries in your neck. It looks at blood flow through these arteries that supply the brain with blood. Allow one hour for this exam. There are no restrictions or special instructions.    Follow-Up: At [facility], you and your health needs are our priority.  As part of our continuing mission to provide you with exceptional heart care, we have created designated Provider Care Teams.  These Care Teams include your primary Cardiologist (physician) and Advanced Practice Providers (APPs -  Physician Assistants and Nurse Practitioners) who all work together to provide you with the care you need, when you need it.  We recommend signing up for the patient portal called "MyChart".  Sign up information is provided on this After Visit Summary.  MyChart is used to connect with patients for Virtual Visits (Telemedicine).  Patients are able to view lab/test results, encounter notes, upcoming appointments, etc.  Non-urgent messages can be sent to your provider as well.   To learn more about what you can do with MyChart, go to [email or web address].    Your next appointment:   Friday Sep 27, 2019  The format for your next appointment:   In Person  Provider:   Minus Breeding, MD  Do the following things EVERY DAY:  1) Weigh yourself EVERY morning after you go to the bathroom but before you eat or drink anything. Write this number down in a weight log/diary. If you gain 3 pounds overnight or 5 pounds in a week, call the office.  2) Take your medicines as prescribed. If you have concerns about your medications, please call us before you stop taking them.   3) Eat low salt foods--Limit salt (sodium) to 2000 mg per day. This will help prevent your body from holding onto fluid. Read food labels as many processed foods have a lot of sodium, especially canned goods and prepackaged meats. If you would like some assistance choosing low sodium foods, we would be happy to set you up with a nutritionist.  4) Stay as active as you can everyday. Staying active will give you more energy and make your muscles stronger. Start with 5 minutes at a time and work your way up to 30 minutes a day. Break up your activities--do some in the morning and some in the afternoon. Start with 3 days per week and work your way up to 5 days as you can.  If you have chest pain, feel short of breath, dizzy, or lightheaded, STOP. If you don't feel better after a short rest, call 911. If you do feel better, call the office to let us know you have symptoms with exercise.  5) Limit all fluids for the day to less than 2 liters. Fluid  includes all drinks, coffee, juice, ice chips, soup, jello, and all other liquids.

## 2019-09-16 NOTE — Telephone Encounter (Signed)
-----   Message from Arlyss Gandy, [place] sent at 09/12/2019 10:51 AM EDT ----- Under eye lesion benign.  Granuloma annulare right forearm per path. Agrees with previous path. Not fungus. No perfect treatment. Can try halobetasol cream bid. May or may not help. Can apply and wrap with a cool moist cloth for 20 mins. Recheck in 8 weeks.

## 2019-09-16 NOTE — Telephone Encounter (Signed)
Received call from patient c/o weakness and swelling in LE.    He states he has been having SOB and swelling but in the last 2 weeks this has worsened.    States he has swelling, pain, and redness in BL LE.  States there are red area on both legs and feet "almost bruise looking".   Not tender to touch, not warm to touch.    States this improves a little bit with elevation but not much.    States he had a bad weekend, felt extremely weak and had "weird" feelings in his chest on and off all weekend.  States he has not had this pain today or currently.    He states this pain was with both exertion and rest, lasted about 5-10 mins and resolved.   He is taking torsemide 20 mg 4x daily (written as 40 mg BID).    He has not noticed significant weight gain, watches salt intake closely.   States BP and HR has been "normal" for him.  He reports walking outside approximately 75 ft yesterday and had to stop due to weakness and SOB.    No distress noted on phone, speaking in complete sentences.    [facility] for [facility].  Advised if symptoms increase or has another episode of chest pain prior, proceed to ER for evaluation.    Patient agreed and verbalized understanding.  He will bring all medications to appointment as well.

## 2019-09-16 NOTE — Telephone Encounter (Signed)
Pathology  To patient. Patient thinks her already has Halobetasol cream, said he would check to see, if he doesn't have it he will call back for [place] to call it in.

## 2019-09-16 NOTE — Telephone Encounter (Signed)
New Message    Pt c/o swelling: STAT is pt has developed SOB within 24 hours  1) How much weight have you gained and in what time span? Wt has been the same   2) If swelling, where is the swelling located? Both legs are swelling and he is now having red spots on both legs   3) Are you currently taking a fluid pill? Yes   4) Are you currently SOB? Bad weekend, he says he has had some SOB but this morning he has none and is just feeling weak   5) Do you have a log of your daily weights (if so, list)? Yes   6) Have you gained 3 pounds in a day or 5 pounds in a week? No     Have you traveled recently? No

## 2019-09-16 NOTE — Progress Notes (Signed)
Cardiology Office Note:    Date:  09/16/2019   ID:  [name], DOB Jun 13, 1955, MRN [id number]  PCP:  [facility]  Cardiologist:  Minus Breeding, MD  Electrophysiologist:  None   Referring MD: [facility]*   Chief Complaint: weakness and lower extremity edema  History of Present Illness:    [name] is a 64 y.o. male with a history of normal coronaries on cardiac cath in [id number], chronic diastolic CHF, atrial fibrillation/flutter s/p flutter ablation in 2014 on Coumadin, COPD, obstructive sleep apnea, hypertension, hyperlipidemia, type 2 diabetes mellitus with peripheral neuropathy, GERD, and chronic back pain who is followed by Dr. Percival Spanish and presents today for evaluation of weakness and lower extremity edema.   Patient has been seen multiple times in the office over the past couple months for acute on chronic diastolic CHF. He was seen by Dr. Percival Spanish on 07/29/2019 with complaints of 10 lb weight gain, lower extremity edema, and shortness of breath. He was started on Lasix 40mg  daily x5 day. BNP only minimally elevated at 102. Echo was ordered and showed LVEF of 60-65% with normal wall motion, moderate LVH, and grade 2 diastolic dysfunction. Also noted to have biatrial enlargement, mild to moderate TR, mild MR, and moderately elevated PASP of 52.5 mmHg. PYP scan was ordered and was equivocal for TTR amyloidosis. Dr. Ellyn Hack did not have a high suspicion for amyloid so no further testing was ordered. He was seen by Kerin Ransom, PA-C, on 08/07/2019 and noted improvement in symptoms weight was down 11 lbs. Chlorthalidone was stopped and patient was continued on Lasix 40mg  daily. He was seen back in the office on 08/22/2019 by Roby Lofts, PA-C, for worsening lower extremity edema and shortness of breath. Weight up 15 lbs from last visit. Felt to be due to dietary indiscretion. He was switched to Torsemide 40mg  daily. He saw Kerin Ransom, [place], again on 08/26/2019  and reported feeling better with less shortness of breath and less edema. Weight back down 12 lbs. No medications changes were made a that visit.   Patient called our office today reporting weakness, lower extremity edema, and shortness of breath. Therefore, patient was added on to my schedule today. Weight up 5 lbs since last visit. He does not feel like he is urinating as much on the Torsemide as he was initially. He notes worsening lower extremity edema for the past weeks but states it actually is a little better today. He sounds short of breath a rest in the office today and states he has off and on shortness of breath at home both at rest and with exertion. Interestingly, he states he often feels better after eating. He has been sleeping almost completely upright but states this is not new and that he has been doing this for the past year. Occasional PND. His chronic pain keeps him from sleeping more than his breathing does. He reports intermittent mild chest discomfort that he just describes as uncomfortable. He states that it normally occurs when he is having a hard time breathing. However, he states the chest pain may develop first at times. He states he has memory issues so he cannot really remember if it is associated with exertion. He reports near syncope when he looks up. He also notes that he feels very weak and thinks this is getting progressively worse.   I suspect volume overload is again due to dietary indiscretion. He reports eating potatoes, hamburgers, occasional hot dogs, and  low sodium deli meat. I also suspect he is getting more than 2 L of fluid per day as he states he drinks three 16 ounce bottles of soda a day in addition to water and Gatorade. He states he is always thirsty.   Past Medical History:  Diagnosis Date  . Arthritis    lower back  . Asthma   . Atrial flutter ([place])    s/p CTI ablation by Dr Rayann Heman  . Brain aneurysm 2009   questionable. A follow up CTA in 2009  showed no evidence of  . Chronic back pain    DDD/stenosis  . Colon polyps    9 polyps removed 10/13/11  . Complication of anesthesia September 11, 2012   slow to awaken after ablation  . COPD (chronic obstructive pulmonary disease) ([place])   . Diabetes mellitus    takes Metformin and Glimepiride daily  . Emphysema   . GERD (gastroesophageal reflux disease)    takes Omeprazole bid  . History of shingles   . HLD (hyperlipidemia)    takes Pravastatin daily  . HTN (hypertension)    takes Prinizide daily  . Obesity   . OSA (obstructive sleep apnea)    not always using cpap  . Overdose 2009   unintentional Flecanide overdose  . Peripheral neuropathy   . Short-term memory loss   . Tobacco abuse     Past Surgical History:  Procedure Laterality Date  . ANTERIOR CERVICAL DECOMP/DISCECTOMY FUSION  01/04/2012   Procedure: ANTERIOR CERVICAL DECOMPRESSION/DISCECTOMY FUSION 1 LEVEL/HARDWARE REMOVAL;  Surgeon: Ophelia Charter, MD;  Location: [facility] [place] [facility]URO ORS;  Service: Neurosurgery;  Laterality: N/A;  explore cervical fusion Cervical six - seven  with removal of codman plate anterior cervical decompression with fusion interbody prothesis plating and bonegraft  . APPENDECTOMY  2-80yrs ago  . ATRIAL FIBRILLATION ABLATION N/A 09/11/2012   PT DID NOT HAVE AN ATRIAL FIBRILLATION ABLATION IN 2014!  ATRIAL FLUTTER ABLATION ONLY  . ATRIAL FLUTTER ABLATION  09/11/2012   CTI ablation by Dr Rayann Heman  . CARDIAC CATHETERIZATION  2008   no significant CAD  . CARDIOVERSION  05/05/2011   Procedure: CARDIOVERSION;  Surgeon: Lelon Perla, MD;  Location: [facility] ENDOSCOPY;  Service: Cardiovascular;  Laterality: N/A;  . CARDIOVERSION Bilateral 07/26/2012   Procedure: CARDIOVERSION;  Surgeon: Minus Breeding, MD;  Location: [facility] ENDOSCOPY;  Service: Cardiovascular;  Laterality: Bilateral;  . CARPAL TUNNEL RELEASE  99/2000   bilateral  . COLONOSCOPY WITH PROPOFOL N/A 12/13/2012   Procedure: COLONOSCOPY WITH PROPOFOL;  Surgeon:  Milus Banister, MD;  Location: [facility] ENDOSCOPY;  Service: Endoscopy;  Laterality: N/A;  . ELECTROPHYSIOLOGIC STUDY N/A 04/21/2015   Procedure: Atrial Fibrillation Ablation;  Surgeon: Thompson Grayer, MD;  Location: [place] CV LAB;  Service: Cardiovascular;  Laterality: N/A;  . HERNIA REPAIR    . KNEE SURGERY  6-73yrs ago   left  . LEFT HEART CATH AND CORONARY ANGIOGRAPHY N/A 09/19/2016   Procedure: Left Heart Cath and Coronary Angiography;  Surgeon: Belva Crome, MD;  Location: [place] CV LAB;  Service: Cardiovascular;  Laterality: N/A;  . Left inguinal hernia repair     as a child  . NASAL SEPTOPLASTY W/ TURBINOPLASTY Bilateral 02/19/2014   Procedure: NASAL SEPTOPLASTY WITH BILATERAL TURBINATE REDUCTION;  Surgeon: Jodi Marble, MD;  Location: [place];  Service: ENT;  Laterality: Bilateral;  . NECK SURGERY  42yrs ago  . right shoulder surgery  4-56yrs ago   cyst removed  . TEE WITHOUT CARDIOVERSION  05/05/2011   Procedure: TRANSESOPHAGEAL ECHOCARDIOGRAM (TEE);  Surgeon: Lelon Perla, MD;  Location: [facility] ENDOSCOPY;  Service: Cardiovascular;  Laterality: N/A;  . TEE WITHOUT CARDIOVERSION N/A 04/21/2015   Procedure: TRANSESOPHAGEAL ECHOCARDIOGRAM (TEE);  Surgeon: Sueanne Margarita, MD;  Location: [facility] ENDOSCOPY;  Service: Cardiovascular;  Laterality: N/A;  . THROAT SURGERY  4-5yrs ago   "thought " it was cancer but came back not  . TONSILLECTOMY      Current Medications: Current Meds  Medication Sig  . albuterol (PROAIR HFA) 108 (90 Base) MCG/ACT inhaler Inhale 1-2 puffs into the lungs every 6 (six) hours as needed for wheezing or shortness of breath.  . B-D UF III MINI PEN NEEDLES 31G X 5 MM MISC   . bisoprolol (ZEBETA) 10 MG tablet Take 10 mg by mouth daily.  . Cholecalciferol (VITAMIN D) 2000 UNITS CAPS Take 2,000 Units by mouth daily.   [name] diltiazem (CARDIZEM CD) 120 MG 24 hr capsule TAKE 1 CAPSULE BY MOUTH DAILY  . gabapentin (NEURONTIN) 100 MG capsule Take 100 mg by mouth 2 (two) times  daily.  [name] HYDROcodone-acetaminophen (NORCO) 7.5-325 MG tablet Take 1 tablet by mouth every 6 (six) hours as needed for moderate pain.  . Insulin Aspart FlexPen 100 UNIT/ML SOPN Inject 60 Units into the skin 3 (three) times daily with meals.  . Insulin Detemir (LEVEMIR FLEXTOUCH) 100 UNIT/ML Pen Inject 80 Units into the skin daily with breakfast.  . Insulin Pen Needle (PEN NEEDLES) 30G X 8 MM MISC 1 each by Does not apply route 5 (five) times daily. E11.9  . liraglutide (VICTOZA) 18 MG/3ML SOPN Inject 0.3 mLs (1.8 mg total) into the skin daily.  [name] losartan (COZAAR) 50 MG tablet Take 1 tablet (50 mg total) by mouth daily.  . metFORMIN (GLUCOPHAGE-XR) 500 MG 24 hr tablet Take 1 tablet (500 mg total) by mouth 2 (two) times daily.  . Multiple Vitamin (MULTIVITAMIN WITH MINERALS) TABS Take 1 tablet by mouth daily.  . nitroGLYCERIN (NITROSTAT) 0.4 MG SL tablet Place 1 tablet (0.4 mg total) under the tongue every 5 (five) minutes x 3 doses as needed for chest pain.  [name] omeprazole (PRILOSEC) 20 MG capsule Take 20 mg by mouth at bedtime.   . potassium chloride SA (KLOR-CON M20) 20 MEQ tablet Take 1 tablet (20 mEq total) by mouth daily.  . pravastatin (PRAVACHOL) 80 MG tablet TAKE 1 TABLET BY MOUTH DAILY AT BEDTIME (Patient taking differently: Take 40 mg by mouth daily. )  . sildenafil (REVATIO) 20 MG tablet Take 20-80 mg by mouth daily as needed (erectile dysfunction).  . torsemide (DEMADEX) 20 MG tablet Take 2 tablets (40 mg total) by mouth daily.  [name] umeclidinium-vilanterol (ANORO ELLIPTA) 62.5-25 MCG/INH AEPB Inhale 1 puff into the lungs daily.  . Vitamin D, Ergocalciferol, (DRISDOL) 1.25 MG (50000 UT) CAPS capsule Take 50,000 Units by mouth every 7 (seven) days.  [name] warfarin (COUMADIN) 5 MG tablet TAKE 1 TO 1 & 1/2 TABLETS BY MOUTH DAILYAS NEEDED AND AS DIRECTED BY THE COUMADIN CLINIC     Allergies:   Adhesive [tape] and Latex   Social History   Socioeconomic History  . Marital status: Married     Spouse name: Not on file  . Number of children: 2  . Years of education: Not on file  . Highest education level: Not on file  Occupational History  . Occupation: Dealer   Tobacco Use  . Smoking status: Current Every Day Smoker    Packs/day: 1.00  Years: 49.00    Pack years: 49.00    Types: Cigarettes  . Smokeless tobacco: Never Used  . Tobacco comment: He is going to try 1-800-QUIT Now for nicotine replacement therapy  Substance and Sexual Activity  . Alcohol use: Yes    Alcohol/week: 0.0 standard drinks    Comment: 12/05/17 pt stated he has drinked 1/2 drink in 3 years  . Drug use: No  . Sexual activity: Yes  Other Topics Concern  . Not on file  Social History Narrative   Daily caffeine(Mountain Dew)       Lives in [place] with spouse.   Unemployed due to chronic back/ leg pain         Social Determinants of Health   Financial Resource Strain:   . Difficulty of Paying Living Expenses:   Food Insecurity:   . Worried About Charity fundraiser in the Last Year:   . Arboriculturist in the Last Year:   Transportation Needs:   . Film/video editor (Medical):   [name] Lack of Transportation (Non-Medical):   Physical Activity:   . Days of Exercise per Week:   . Minutes of Exercise per Session:   Stress:   . Feeling of Stress :   Social Connections:   . Frequency of Communication with Friends and Family:   . Frequency of Social Gatherings with Friends and Family:   . Attends Religious Services:   . Active Member of Clubs or Organizations:   . Attends Archivist Meetings:   [name] Marital Status:      Family History: The patient's family history includes Diabetes in his maternal grandmother and mother; Heart disease in his father; Lung cancer in his father. There is no history of Colon cancer, Anesthesia problems, Hypotension, Malignant hyperthermia, or Pseudochol deficiency.  ROS:   Please see the history of present illness.    All other systems  reviewed and are negative.  EKGs/Labs/Other Studies Reviewed:    The following studies were reviewed today:  Left Heart Catheterization 09/19/2016:  Widely patent/normal coronary arteries.  Normal left ventricular systolic function with EF greater than 55%. Elevated end-diastolic pressure, consistent with chronic diastolic dysfunction.  Recommendations:  Symptoms are not related to ischemic heart disease.  Consider chronic sleep deprivation, PAD, or other explanations. _______________  Echocardiogram 08/14/2019: Impressions: 1. Left ventricular ejection fraction, by estimation, is 60 to 65%. The  left ventricle has normal function. The left ventricle has no regional  wall motion abnormalities. There is moderate concentric left ventricular  hypertrophy. Left ventricular  diastolic parameters are consistent with Grade II diastolic dysfunction  (pseudonormalization). Elevated left atrial pressure.  2. Right ventricular systolic function is normal. The right ventricular  size is normal. There is moderately elevated pulmonary artery systolic  pressure. The estimated right ventricular systolic pressure is 0000000 mmHg.  3. Left atrial size was severely dilated.  4. Right atrial size was moderately dilated.  5. The mitral valve is normal in structure. Mild mitral valve  regurgitation. No evidence of mitral stenosis.  6. Tricuspid valve regurgitation is mild to moderate.  7. The aortic valve is tricuspid. Aortic valve regurgitation is not  visualized. Mild to moderate aortic valve sclerosis/calcification is  present, without any evidence of aortic stenosis.  8. The inferior vena cava is dilated in size with >50% respiratory  variability, suggesting right atrial pressure of 8 mmHg.   Comparison(s): No significant change from prior study. Prior images  reviewed side by side.  _______________  PYP Scan 09/06/2019: PYP scan results for amyloidosis: Images taken at 1 hour and 3  hours. Heart to contralateral lung ratio 1.0 at 1 hour and 1.0 at three hours.  Scan is consistent with: Grade 1 (increased heart uptake but less than rib uptake)  Heart to contralateral lung ratio is: Between 1-1.5, indeterminate for amyloid  Study is: Equivocal for TTR amyloidosis (visual score of 1/ratio between 1-1.5)  EKG:  EKG not ordered today.   Recent Labs: 07/29/2019: BNP 102.1; TSH 4.330 09/06/2019: BUN 22; Creatinine, Ser 1.22; Potassium 4.0; Sodium 145  Recent Lipid Panel    Component Value Date/Time   CHOL 122 08/18/2017 0910   TRIG 400 (H) 08/18/2017 0910   HDL 29 (L) 08/18/2017 0910   CHOLHDL 4.2 08/18/2017 0910   CHOLHDL 5.3 08/27/2016 0235   VLDL 66 (H) 08/27/2016 0235   LDLCALC 13 08/18/2017 0910   LDLDIRECT 99.9 09/08/2011 0843    Physical Exam:    Vital Signs: BP (!) 142/72   Pulse 89   Temp (!) 97.1 F (36.2 C)   Ht 6\' 3"  (1.905 m)   Wt 266 lb 3.2 oz (120.7 kg)   SpO2 95%   BMI 33.27 kg/m     Wt Readings from Last 3 Encounters:  09/16/19 266 lb 3.2 oz (120.7 kg)  09/06/19 261 lb (118.4 kg)  08/26/19 263 lb 9.6 oz (119.6 kg)     General: 64 y.o. male in no acute distress. HEENT: Normocephalic and atraumatic. Sclera clear.  Neck: Supple. Right carotid bruit noted. Difficult to assess JVD due to body habitus.  Heart: RRR. Distinct S1 and S2. II-III/VI systolic murmur best heard at upper sternal border. No gallops or rubs. Radial pulses 2+ and equal bilaterally. Lungs: No increased work of breathing. Mild wheezing/rhonchi noted bilaterally. No rales appreciated.  Abdomen: Soft, distended, and non-tender to palpation. Bowel sounds present. MSK: Normal strength and tone for age.  Extremities: 2+ pitting edema of bilateral lower extremities. Skin: Warm and dry. Neuro: Alert and oriented x3. No focal deficits. Psych: Normal affect. Responds appropriately.  Assessment:    1. Acute on chronic diastolic CHF (congestive heart failure) ([place])    2. Atypical chest pain   3. Paroxysmal atrial fibrillation ([facility])   4. Paroxysmal atrial flutter ([place])   5. Bruit   6. Essential hypertension   7. Hyperlipidemia, unspecified hyperlipidemia type   8. Type 2 diabetes mellitus with obesity ([place])   9. Chronic obstructive pulmonary disease, unspecified COPD type ([place])   10. Tobacco abuse   11. Medication management     Plan:    Acute on Chronic Diastolic CHF  - Patient has been seen multiple times over the last 2 months for volume overload and acute on chronic diastolic CHF. Suspect this is largely being driven by dietary indiscretion.  - Recent Echo from LVEF of 60-65% with normal wall motion, moderate LVH, grade 2 diastolic dysfunction, and elevated left atrial pressures. Also noted to have biatrial enlargement, mild to moderate TR, mild MR, and moderately elevated PASP of 52.5 mmHg.  - Patient currently on Torsemide 40mg  twice daily (takes K-Dur 20 mEq with this). Still significantly volume overloaded with abdominal distension and 2+ lower extremity edema. Weight up 5 lbs since last visit.  - Suspect patient's dyspnea is multifactorial due to acute on chronic CHF, COPD, continued tobacco use, body habitus, and deconditioning. No crackles on exam.  - Before adjusting diuretics, want to make sure renal function and potassium are  OK after Torsemide was adjusted. Will recheck BNP, BMET, and CBC today. Depending on renal function and electrolytes will either increase Torsemide or add Metolazone 2.5mg  twice a week.  - Re-emphasized the importance of daily weights and sodium/fluid restrictions.    Atypical Chest Pain - Normal coronaries on cardiac cath in 09/2016. - Patient notes vague chest discomfort mostly at rest in association with shortness of breath.  - Suspect this is due to CHF and not ACS. No repeat ischemic work-up necessary at this time. However, patient to notify us if this worsens or becomes associated with exertion.   Paroxysmal  Atrial Fibrillation/Flutter s/p Flutter Ablation in 2014 - Maintaining sinus rhythm.  - Continue Bisoprolol 10mg  daily and Cardizem CD 120mg  daily.  - Continue chronic anticoagulation with Coumadin.   Bruit - Right carotid bruit noted on exam.  - Will check carotid dopplers.   Hypertension - BP mildly elevated at 142/72.  - Continue current medications: Bisoprolol 10mg  daily, Diltiazem 120mg  daily, Losartan 50mg  daily.  - Suspect additional diuresis will also help BP. - Patient to keep log of BP at home and will bring to follow-up appointment.   Hyperlipidemia - Continue Pravastatin. - Did not discuss at visit today due to acute needs as above.  Type 2 Diabetes Mellitus - Management per primary team.   COPD - Mild wheezing/rhonchi noted on exam but patient states this is normal for him.  - Recommended patient use home inhalers as prescribed.  - Of note, did ambulate patient in the office and he did not O2 sats remained at least 90%.  Tobacco Abuse - Patient continues to smoke 1 pack/day. - Recommended complete cessation.  Disposition: Will go ahead and schedule follow-up with Dr. Percival Spanish for 09/27/2019; however, may need to be seen earlier next week depending on what lab work shows today and what diuretic adjustments are made.     Medication Adjustments/Labs and Tests Ordered: Current medicines are reviewed at length with the patient today.  Concerns regarding medicines are outlined above.  Orders Placed This Encounter  Procedures  . Basic metabolic panel  . Brain natriuretic peptide  . CBC  . VAS US CAROTID   No orders of the defined types were placed in this encounter.   Patient Instructions  Medication Instructions:  Your Physician recommend you continue on your current medication as directed.    *If you need a refill on your cardiac medications before your next appointment, please call your pharmacy*   Lab Work: Your physician recommends that you return for  lab work today ( BNP, BMP, CBC)  If you have labs (blood work) drawn today and your tests are completely normal, you will receive your results only by: [name] MyChart Message (if you have MyChart) OR . A paper copy in the mail If you have any lab test that is abnormal or we need to change your treatment, we will call you to review the results.   Testing/Procedures: Your physician has requested that you have a carotid duplex. This test is an ultrasound of the carotid arteries in your neck. It looks at blood flow through these arteries that supply the brain with blood. Allow one hour for this exam. There are no restrictions or special instructions.    Follow-Up: At [facility], you and your health needs are our priority.  As part of our continuing mission to provide you with exceptional heart care, we have created designated Provider Care Teams.  These Care Teams include your primary Cardiologist (  physician) and Advanced Practice Providers (APPs -  Physician Assistants and Nurse Practitioners) who all work together to provide you with the care you need, when you need it.  We recommend signing up for the patient portal called "MyChart".  Sign up information is provided on this After Visit Summary.  MyChart is used to connect with patients for Virtual Visits (Telemedicine).  Patients are able to view lab/test results, encounter notes, upcoming appointments, etc.  Non-urgent messages can be sent to your provider as well.   To learn more about what you can do with MyChart, go to [email or web address].    Your next appointment:   Friday Sep 27, 2019  The format for your next appointment:   In Person  Provider:   Minus Breeding, MD  Do the following things EVERY DAY:  1) Weigh yourself EVERY morning after you go to the bathroom but before you eat or drink anything. Write this number down in a weight log/diary. If you gain 3 pounds overnight or 5 pounds in a week, call the office.  2) Take  your medicines as prescribed. If you have concerns about your medications, please call us before you stop taking them.   3) Eat low salt foods--Limit salt (sodium) to 2000 mg per day. This will help prevent your body from holding onto fluid. Read food labels as many processed foods have a lot of sodium, especially canned goods and prepackaged meats. If you would like some assistance choosing low sodium foods, we would be happy to set you up with a nutritionist.  4) Stay as active as you can everyday. Staying active will give you more energy and make your muscles stronger. Start with 5 minutes at a time and work your way up to 30 minutes a day. Break up your activities--do some in the morning and some in the afternoon. Start with 3 days per week and work your way up to 5 days as you can.  If you have chest pain, feel short of breath, dizzy, or lightheaded, STOP. If you don't feel better after a short rest, call 911. If you do feel better, call the office to let us know you have symptoms with exercise.  5) Limit all fluids for the day to less than 2 liters. Fluid includes all drinks, coffee, juice, ice chips, soup, jello, and all other liquids.        Signed, Darreld Mclean, PA-C  09/16/2019 5:56 PM    [facility]

## 2019-09-17 ENCOUNTER — Telehealth: Payer: Self-pay | Admitting: *Deleted

## 2019-09-17 ENCOUNTER — Other Ambulatory Visit: Payer: Self-pay | Admitting: Pharmacist

## 2019-09-17 DIAGNOSIS — D649 Anemia, unspecified: Secondary | ICD-10-CM

## 2019-09-17 DIAGNOSIS — Z5181 Encounter for therapeutic drug level monitoring: Secondary | ICD-10-CM

## 2019-09-17 LAB — CBC
Hematocrit: 36.2 % — ABNORMAL LOW (ref 37.5–51.0)
Hemoglobin: 12 g/dL — ABNORMAL LOW (ref 13.0–17.7)
MCH: 30 pg (ref 26.6–33.0)
MCHC: 33.1 g/dL (ref 31.5–35.7)
MCV: 91 fL (ref 79–97)
Platelets: 141 10*3/uL — ABNORMAL LOW (ref 150–450)
RBC: 4 x10E6/uL — ABNORMAL LOW (ref 4.14–5.80)
RDW: 15.3 % (ref 11.6–15.4)
WBC: 6.3 10*3/uL (ref 3.4–10.8)

## 2019-09-17 LAB — BASIC METABOLIC PANEL
BUN/Creatinine Ratio: 16 (ref 10–24)
BUN: 17 mg/dL (ref 8–27)
CO2: 28 mmol/L (ref 20–29)
Calcium: 9.6 mg/dL (ref 8.6–10.2)
Chloride: 102 mmol/L (ref 96–106)
Creatinine, Ser: 1.05 mg/dL (ref 0.76–1.27)
GFR calc Af Amer: 88 mL/min/{1.73_m2} (ref >59)
GFR calc non Af Amer: 76 mL/min/{1.73_m2} (ref >59)
Glucose: 88 mg/dL (ref 65–99)
Potassium: 3.9 mmol/L (ref 3.5–5.2)
Sodium: 146 mmol/L — ABNORMAL HIGH (ref 134–144)

## 2019-09-17 LAB — BRAIN NATRIURETIC PEPTIDE: BNP: 61.2 pg/mL (ref 0.0–100.0)

## 2019-09-17 MED ORDER — METOLAZONE 2.5 MG PO TABS
ORAL_TABLET | ORAL | 3 refills | Status: DC
Start: 2019-09-17 — End: 2020-05-19

## 2019-09-17 MED ORDER — POTASSIUM CHLORIDE CRYS ER 20 MEQ PO TBCR: 20.0000 meq | EXTENDED_RELEASE_TABLET | Freq: Every day | ORAL | 3 refills | Status: DC

## 2019-09-17 MED ORDER — WARFARIN SODIUM 5 MG PO TABS: ORAL_TABLET | ORAL | 4 refills | Status: DC

## 2019-09-17 NOTE — Telephone Encounter (Signed)
Office visit completed 09/16/2019.

## 2019-09-17 NOTE — Progress Notes (Signed)
I would suggest the Zaroxolyn for a week or so.  Thanks.  Maylon Cos

## 2019-09-17 NOTE — Telephone Encounter (Signed)
-----   Message from Darreld Mclean, [place] sent at 09/17/2019 12:52 PM EDT ----- Please notify patient of results: Fluid marker is not quite back yet but kidney function and potassium are stable. Would like to go ahead and add Metolazone 2.5mg  twice a week (Tuesday and Thursdays) in addition to current dose of Torsemide. Take the Metolazone 30 minutes before morning dose of Torsemide (but can go ahead and takes today's dose this afternoon). On days, when he takes Metolazone, he should double up on his potassium supplement and take 40 mEq.   Will need repeat BMET in 1 week.   Also, hemoglobin is a little low at 12.0 which it looks like is lower than it has been recently. Can we confirm that he is not having any blood in urine or stools and just have him follow-up with PCP.   Thank you!

## 2019-09-17 NOTE — Telephone Encounter (Signed)
Advised patient, verbalized understanding Has had dark stools, advised to contact PCP. Will recheck CBC next week per Elnita Maxwell PA

## 2019-09-18 ENCOUNTER — Other Ambulatory Visit: Payer: Self-pay

## 2019-09-18 ENCOUNTER — Encounter: Payer: Self-pay | Admitting: Endocrinology

## 2019-09-18 ENCOUNTER — Ambulatory Visit (INDEPENDENT_AMBULATORY_CARE_PROVIDER_SITE_OTHER): Payer: 59 | Admitting: Endocrinology

## 2019-09-18 VITALS — BP 130/60 | HR 80 | Ht 75.0 in | Wt 262.0 lb

## 2019-09-18 DIAGNOSIS — E119 Type 2 diabetes mellitus without complications: Secondary | ICD-10-CM

## 2019-09-18 DIAGNOSIS — Z794 Long term (current) use of insulin: Secondary | ICD-10-CM | POA: Diagnosis not present

## 2019-09-18 LAB — GLUCOSE, POCT (MANUAL RESULT ENTRY): POC Glucose: 217 mg/dl — AB (ref 70–99)

## 2019-09-18 MED ORDER — LEVEMIR FLEXTOUCH 100 UNIT/ML ~~LOC~~ SOPN: 80.0000 [IU] | PEN_INJECTOR | Freq: Every day | SUBCUTANEOUS | 11 refills | Status: DC

## 2019-09-18 NOTE — Progress Notes (Signed)
Subjective:    Patient ID: [name], male    DOB: June 23, 1955, 64 y.o.   MRN: [id number]  HPI Pt returns for f/u of diabetes mellitus:  DM type: Insulin-requiring type 2 Dx'ed: 0000000 Complications: none Therapy: insulin since 2019, metformin, and Victoza.  DKA: never Severe hypoglycemia: never Pancreatitis: never Pancreatic imaging: normal on 2017 CT SDOH: none Other: he takes multiple daily injections; he did not tolerate Jardiance (dehydration); metformin dosage is limited by abd pain.    Interval history: Pt says he seldom misses the insulin.  no cbg record, but states cbg's vary from 180-351.  It is in general lowest in the afternoon.  it is in general highest after he misses the insulin.  Main symptom is fatigue.  He has mild hypoglycemia approx twice per month.  This happens in the afternoon.   Past Medical History:  Diagnosis Date  . Arthritis    lower back  . Asthma   . Atrial flutter ([place])    s/p CTI ablation by Dr Rayann Heman  . Brain aneurysm 2009   questionable. A follow up CTA in 2009 showed no evidence of  . Chronic back pain    DDD/stenosis  . Colon polyps    9 polyps removed 10/13/11  . Complication of anesthesia September 11, 2012   slow to awaken after ablation  . COPD (chronic obstructive pulmonary disease) ([place])   . Diabetes mellitus    takes Metformin and Glimepiride daily  . Emphysema   . GERD (gastroesophageal reflux disease)    takes Omeprazole bid  . History of shingles   . HLD (hyperlipidemia)    takes Pravastatin daily  . HTN (hypertension)    takes Prinizide daily  . Obesity   . OSA (obstructive sleep apnea)    not always using cpap  . Overdose 2009   unintentional Flecanide overdose  . Peripheral neuropathy   . Short-term memory loss   . Tobacco abuse     Past Surgical History:  Procedure Laterality Date  . ANTERIOR CERVICAL DECOMP/DISCECTOMY FUSION  01/04/2012   Procedure: ANTERIOR CERVICAL DECOMPRESSION/DISCECTOMY FUSION 1  LEVEL/HARDWARE REMOVAL;  Surgeon: Ophelia Charter, MD;  Location: [facility]URO ORS;  Service: Neurosurgery;  Laterality: N/A;  explore cervical fusion Cervical six - seven  with removal of codman plate anterior cervical decompression with fusion interbody prothesis plating and bonegraft  . APPENDECTOMY  2-60yrs ago  . ATRIAL FIBRILLATION ABLATION N/A 09/11/2012   PT DID NOT HAVE AN ATRIAL FIBRILLATION ABLATION IN 2014!  ATRIAL FLUTTER ABLATION ONLY  . ATRIAL FLUTTER ABLATION  09/11/2012   CTI ablation by Dr Rayann Heman  . CARDIAC CATHETERIZATION  2008   no significant CAD  . CARDIOVERSION  05/05/2011   Procedure: CARDIOVERSION;  Surgeon: Lelon Perla, MD;  Location: [facility] ENDOSCOPY;  Service: Cardiovascular;  Laterality: N/A;  . CARDIOVERSION Bilateral 07/26/2012   Procedure: CARDIOVERSION;  Surgeon: Minus Breeding, MD;  Location: [facility] ENDOSCOPY;  Service: Cardiovascular;  Laterality: Bilateral;  . CARPAL TUNNEL RELEASE  99/2000   bilateral  . COLONOSCOPY WITH PROPOFOL N/A 12/13/2012   Procedure: COLONOSCOPY WITH PROPOFOL;  Surgeon: Milus Banister, MD;  Location: [facility] ENDOSCOPY;  Service: Endoscopy;  Laterality: N/A;  . ELECTROPHYSIOLOGIC STUDY N/A 04/21/2015   Procedure: Atrial Fibrillation Ablation;  Surgeon: Thompson Grayer, MD;  Location: [place] CV LAB;  Service: Cardiovascular;  Laterality: N/A;  . HERNIA REPAIR    . KNEE SURGERY  6-42yrs ago   left  . LEFT HEART CATH  AND CORONARY ANGIOGRAPHY N/A 09/19/2016   Procedure: Left Heart Cath and Coronary Angiography;  Surgeon: Belva Crome, MD;  Location: [place] CV LAB;  Service: Cardiovascular;  Laterality: N/A;  . Left inguinal hernia repair     as a child  . NASAL SEPTOPLASTY W/ TURBINOPLASTY Bilateral 02/19/2014   Procedure: NASAL SEPTOPLASTY WITH BILATERAL TURBINATE REDUCTION;  Surgeon: Jodi Marble, MD;  Location: [place];  Service: ENT;  Laterality: Bilateral;  . NECK SURGERY  61yrs ago  . right shoulder surgery  4-72yrs ago   cyst removed  .  TEE WITHOUT CARDIOVERSION  05/05/2011   Procedure: TRANSESOPHAGEAL ECHOCARDIOGRAM (TEE);  Surgeon: Lelon Perla, MD;  Location: [facility] ENDOSCOPY;  Service: Cardiovascular;  Laterality: N/A;  . TEE WITHOUT CARDIOVERSION N/A 04/21/2015   Procedure: TRANSESOPHAGEAL ECHOCARDIOGRAM (TEE);  Surgeon: Sueanne Margarita, MD;  Location: [facility] ENDOSCOPY;  Service: Cardiovascular;  Laterality: N/A;  . THROAT SURGERY  4-84yrs ago   "thought " it was cancer but came back not  . TONSILLECTOMY      Social History   Socioeconomic History  . Marital status: Married    Spouse name: Not on file  . Number of children: 2  . Years of education: Not on file  . Highest education level: Not on file  Occupational History  . Occupation: Dealer   Tobacco Use  . Smoking status: Current Every Day Smoker    Packs/day: 1.00    Years: 49.00    Pack years: 49.00    Types: Cigarettes  . Smokeless tobacco: Never Used  . Tobacco comment: He is going to try 1-800-QUIT Now for nicotine replacement therapy  Substance and Sexual Activity  . Alcohol use: Yes    Alcohol/week: 0.0 standard drinks    Comment: 12/05/17 pt stated he has drinked 1/2 drink in 3 years  . Drug use: No  . Sexual activity: Yes  Other Topics Concern  . Not on file  Social History Narrative   Daily caffeine(Mountain Dew)       Lives in [place] with spouse.   Unemployed due to chronic back/ leg pain         Social Determinants of Health   Financial Resource Strain:   . Difficulty of Paying Living Expenses:   Food Insecurity:   . Worried About Charity fundraiser in the Last Year:   . Arboriculturist in the Last Year:   Transportation Needs:   . Film/video editor (Medical):   [name] Lack of Transportation (Non-Medical):   Physical Activity:   . Days of Exercise per Week:   . Minutes of Exercise per Session:   Stress:   . Feeling of Stress :   Social Connections:   . Frequency of Communication with Friends and Family:   .  Frequency of Social Gatherings with Friends and Family:   . Attends Religious Services:   . Active Member of Clubs or Organizations:   . Attends Archivist Meetings:   [name] Marital Status:   Intimate Partner Violence:   . Fear of Current or Ex-Partner:   . Emotionally Abused:   [name] Physically Abused:   . Sexually Abused:     Current Outpatient Medications on File Prior to Visit  Medication Sig Dispense Refill  . albuterol (PROAIR HFA) 108 (90 Base) MCG/ACT inhaler Inhale 1-2 puffs into the lungs every 6 (six) hours as needed for wheezing or shortness of breath. 1 Inhaler 5  . B-D UF III  MINI PEN NEEDLES 31G X 5 MM MISC     . bisoprolol (ZEBETA) 10 MG tablet Take 10 mg by mouth daily.    . Cholecalciferol (VITAMIN D) 2000 UNITS CAPS Take 2,000 Units by mouth daily.     [name] diltiazem (CARDIZEM CD) 120 MG 24 hr capsule TAKE 1 CAPSULE BY MOUTH DAILY 30 capsule 4  . gabapentin (NEURONTIN) 100 MG capsule Take 100 mg by mouth 2 (two) times daily.    [name] HYDROcodone-acetaminophen (NORCO) 7.5-325 MG tablet Take 1 tablet by mouth every 6 (six) hours as needed for moderate pain.    . Insulin Aspart FlexPen 100 UNIT/ML SOPN Inject 60 Units into the skin 3 (three) times daily with meals. 162 mL 0  . Insulin Pen Needle (PEN NEEDLES) 30G X 8 MM MISC 1 each by Does not apply route 5 (five) times daily. E11.9 200 each 1  . liraglutide (VICTOZA) 18 MG/3ML SOPN Inject 0.3 mLs (1.8 mg total) into the skin daily. 9 mL 11  . losartan (COZAAR) 50 MG tablet Take 1 tablet (50 mg total) by mouth daily. 90 tablet 3  . metFORMIN (GLUCOPHAGE-XR) 500 MG 24 hr tablet Take 1 tablet (500 mg total) by mouth 2 (two) times daily. 180 tablet 3  . metolazone (ZAROXOLYN) 2.5 MG tablet TAKE 1 TABLET ON Tuesday AND Thursday 30 MINUTES BEFORE TAKING TORSEMIDE OR AS DIRECTED 15 tablet 3  . Multiple Vitamin (MULTIVITAMIN WITH MINERALS) TABS Take 1 tablet by mouth daily.    . nitroGLYCERIN (NITROSTAT) 0.4 MG SL tablet Place 1  tablet (0.4 mg total) under the tongue every 5 (five) minutes x 3 doses as needed for chest pain. 25 tablet 3  . omeprazole (PRILOSEC) 20 MG capsule Take 20 mg by mouth at bedtime.     . potassium chloride SA (KLOR-CON M20) 20 MEQ tablet Take 1 tablet (20 mEq total) by mouth daily. TAKE AN EXTRA TABLET ON Tuesday AND Thursday 40 tablet 3  . pravastatin (PRAVACHOL) 80 MG tablet TAKE 1 TABLET BY MOUTH DAILY AT BEDTIME (Patient taking differently: Take 40 mg by mouth daily. ) 30 tablet 10  . sildenafil (REVATIO) 20 MG tablet Take 20-80 mg by mouth daily as needed (erectile dysfunction).    . torsemide (DEMADEX) 20 MG tablet Take 2 tablets (40 mg total) by mouth daily. 90 tablet 3  . umeclidinium-vilanterol (ANORO ELLIPTA) 62.5-25 MCG/INH AEPB Inhale 1 puff into the lungs daily. 1 each 3  . Vitamin D, Ergocalciferol, (DRISDOL) 1.25 MG (50000 UT) CAPS capsule Take 50,000 Units by mouth every 7 (seven) days.    [name] warfarin (COUMADIN) 5 MG tablet TAKE 1 TO 1 & 1/2 TABLETS BY MOUTH DAILY AS DIRECTED BY THE COUMADIN CLINIC 45 tablet 4   No current facility-administered medications on file prior to visit.    Allergies  Allergen Reactions  . Adhesive [Tape]     itching  . Latex Itching    When tape is on the skin too long skin gets red & itching    Family History  Problem Relation Age of Onset  . Diabetes Mother   . Heart disease Father   . Lung cancer Father   . Diabetes Maternal Grandmother   . Colon cancer Neg Hx   . Anesthesia problems Neg Hx   . Hypotension Neg Hx   . Malignant hyperthermia Neg Hx   . Pseudochol deficiency Neg Hx     BP 130/60   Pulse 80   Ht 6\' 3"  (1.905  m)   Wt 262 lb (118.8 kg)   SpO2 92%   BMI 32.75 kg/m    Review of Systems Denies LOC    Objective:   Physical Exam VITAL SIGNS:  See vs page GENERAL: no distress   Lab Results  Component Value Date   CREATININE 1.05 09/16/2019   BUN 17 09/16/2019   NA 146 (H) 09/16/2019   K 3.9 09/16/2019   CL 102  09/16/2019   CO2 28 09/16/2019    Lab Results  Component Value Date   HGBA1C 8.4 (A) 08/22/2019        Assessment & Plan:  Insulin-requiring type 2 DM: The pattern of his cbg's indicates he needs some adjustment in his therapy.  Noncompliance with cbg recording: I advised pt of the need to check cbg.   Hypoglycemia, due to insulin: this limits aggressiveness of glycemic control.    Patient Instructions  Please change the Levemir to the morning, and: continue the same Novolog.  Please continue the same metformin, Levemir, and Victoza.   check your blood sugar twice a day.  vary the time of day when you check, between before the 3 meals, and at bedtime.  also check if you have symptoms of your blood sugar being too high or too low.  please keep a record of the readings and bring it to your next appointment here (or you can bring the meter itself).  You can write it on any piece of paper.  please call us sooner if your blood sugar goes below 70, or if you have a lot of readings over 200. Please come back for a follow-up appointment in 1 month.

## 2019-09-18 NOTE — Patient Instructions (Addendum)
Please change the Levemir to the morning, and: continue the same Novolog.  Please continue the same metformin, Levemir, and Victoza.   check your blood sugar twice a day.  vary the time of day when you check, between before the 3 meals, and at bedtime.  also check if you have symptoms of your blood sugar being too high or too low.  please keep a record of the readings and bring it to your next appointment here (or you can bring the meter itself).  You can write it on any piece of paper.  please call us sooner if your blood sugar goes below 70, or if you have a lot of readings over 200. Please come back for a follow-up appointment in 1 month.

## 2019-09-25 ENCOUNTER — Other Ambulatory Visit: Payer: Self-pay | Admitting: Endocrinology

## 2019-09-25 DIAGNOSIS — E119 Type 2 diabetes mellitus without complications: Secondary | ICD-10-CM

## 2019-09-25 LAB — CBC WITH DIFFERENTIAL/PLATELET
Basophils Absolute: 0 10*3/uL (ref 0.0–0.2)
Basos: 0 %
EOS (ABSOLUTE): 0.1 10*3/uL (ref 0.0–0.4)
Eos: 2 %
Hematocrit: 34.5 % — ABNORMAL LOW (ref 37.5–51.0)
Hemoglobin: 11.8 g/dL — ABNORMAL LOW (ref 13.0–17.7)
Immature Grans (Abs): 0 10*3/uL (ref 0.0–0.1)
Immature Granulocytes: 0 %
Lymphocytes Absolute: 1.2 10*3/uL (ref 0.7–3.1)
Lymphs: 22 %
MCH: 30.7 pg (ref 26.6–33.0)
MCHC: 34.2 g/dL (ref 31.5–35.7)
MCV: 90 fL (ref 79–97)
Monocytes Absolute: 0.5 10*3/uL (ref 0.1–0.9)
Monocytes: 9 %
Neutrophils Absolute: 3.6 10*3/uL (ref 1.4–7.0)
Neutrophils: 67 %
Platelets: 124 10*3/uL — ABNORMAL LOW (ref 150–450)
RBC: 3.84 x10E6/uL — ABNORMAL LOW (ref 4.14–5.80)
RDW: 15.3 % (ref 11.6–15.4)
WBC: 5.4 10*3/uL (ref 3.4–10.8)

## 2019-09-25 LAB — BASIC METABOLIC PANEL
BUN/Creatinine Ratio: 23 (ref 10–24)
BUN: 29 mg/dL — ABNORMAL HIGH (ref 8–27)
CO2: 29 mmol/L (ref 20–29)
Calcium: 9.4 mg/dL (ref 8.6–10.2)
Chloride: 101 mmol/L (ref 96–106)
Creatinine, Ser: 1.26 mg/dL (ref 0.76–1.27)
GFR calc Af Amer: 71 mL/min/{1.73_m2} (ref >59)
GFR calc non Af Amer: 61 mL/min/{1.73_m2} (ref >59)
Glucose: 184 mg/dL — ABNORMAL HIGH (ref 65–99)
Potassium: 3.5 mmol/L (ref 3.5–5.2)
Sodium: 145 mmol/L — ABNORMAL HIGH (ref 134–144)

## 2019-09-26 NOTE — Progress Notes (Signed)
Cardiology Office Note   Date:  09/27/2019   ID:  [name], DOB June 19, 1955, MRN [id number]  PCP:  Ryan Gaul, FNP  Cardiologist:   Ryan Breeding, MD  No chief complaint on file.     History of Present Illness: [name] is a 64 y.o. male who presents for who presents for follow up of atrial fibrillation and atrial flutter. He is s/p flutter ablation in 2014 and has actually done well from this standpoint. He had a cath in May 2018 that showed widely patent coronaries and normal LVF with moderate LVH. He does have significant COPD, sleep apnea (intolerant to C-pap), HTN, chronic back pain, and NIDDM.  He has had increased swelling and SOB.    He has not responded to diuresis.   He was drinking excess fluid and did have salt in his diet.  We have treated him with increased diuresis including Zaroxolyn.    He has had some reported GI bleeding with some red blood.  Has had some dark black stools.  He was a little bit anemic a few days ago.  He has had some slightly supratherapeutic INRs.  He has been weak.  The weakness has predated any bleeding and has had shortness of breath which we have been managing.  He does some wheezing.  He has not had much improvement with his diuresis.  He did not have an elevated BNP.    I do not think he really follows a salt free diet.  He mentioned going to [employer] and they are on their way to [facility].  He does have some mild lower extremity swelling.  Mostly has diffuse weakness and fatigue.  He has diffuse muscle aches.  Past Medical History:  Diagnosis Date  . Arthritis    lower back  . Asthma   . Atrial flutter ([place])    s/p CTI ablation by Dr [name]  . Brain aneurysm 2009   questionable. A follow up CTA in 2009 showed no evidence of  . Chronic back pain    DDD/stenosis  . Colon polyps    9 polyps removed 10/13/11  . Complication of anesthesia September 11, 2012   slow to awaken after ablation  . COPD (chronic obstructive  pulmonary disease) ([place])   . Diabetes mellitus    takes Metformin and Glimepiride daily  . Emphysema   . GERD (gastroesophageal reflux disease)    takes Omeprazole bid  . History of shingles   . HLD (hyperlipidemia)    takes Pravastatin daily  . HTN (hypertension)    takes Prinizide daily  . Obesity   . OSA (obstructive sleep apnea)    not always using cpap  . Overdose 2009   unintentional Flecanide overdose  . Peripheral neuropathy   . Short-term memory loss   . Tobacco abuse     Past Surgical History:  Procedure Laterality Date  . ANTERIOR CERVICAL DECOMP/DISCECTOMY FUSION  01/04/2012   Procedure: ANTERIOR CERVICAL DECOMPRESSION/DISCECTOMY FUSION 1 LEVEL/HARDWARE REMOVAL;  Surgeon: Ryan Charter, MD;  Location: [place] NEURO ORS;  Service: Neurosurgery;  Laterality: N/A;  explore cervical fusion Cervical six - seven  with removal of codman plate anterior cervical decompression with fusion interbody prothesis plating and bonegraft  . APPENDECTOMY  2-57yr ago  . ATRIAL FIBRILLATION ABLATION N/A 09/11/2012   PT DID NOT HAVE AN ATRIAL FIBRILLATION ABLATION IN 2014!  ATRIAL FLUTTER ABLATION ONLY  . ATRIAL FLUTTER ABLATION  09/11/2012   CTI ablation by  Dr [name]  . CARDIAC CATHETERIZATION  2008   no significant CAD  . CARDIOVERSION  05/05/2011   Procedure: CARDIOVERSION;  Surgeon: Ryan Perla, MD;  Location: [facility] ENDOSCOPY;  Service: Cardiovascular;  Laterality: N/A;  . CARDIOVERSION Bilateral 07/26/2012   Procedure: CARDIOVERSION;  Surgeon: Ryan Breeding, MD;  Location: [facility] ENDOSCOPY;  Service: Cardiovascular;  Laterality: Bilateral;  . CARPAL TUNNEL RELEASE  99/2000   bilateral  . COLONOSCOPY WITH PROPOFOL N/A 12/13/2012   Procedure: COLONOSCOPY WITH PROPOFOL;  Surgeon: Ryan Banister, MD;  Location: [facility] ENDOSCOPY;  Service: Endoscopy;  Laterality: N/A;  . ELECTROPHYSIOLOGIC STUDY N/A 04/21/2015   Procedure: Atrial Fibrillation Ablation;  Surgeon: Ryan Grayer, MD;  Location: [place] CV LAB;  Service: Cardiovascular;  Laterality: N/A;  . HERNIA REPAIR    . KNEE SURGERY  6-98yr ago   left  . LEFT HEART CATH AND CORONARY ANGIOGRAPHY N/A 09/19/2016   Procedure: Left Heart Cath and Coronary Angiography;  Surgeon: Ryan Crome MD;  [place] [facility];  Service: Cardiovascular;  Laterality: N/A;  . Left inguinal hernia repair     as a child  . NASAL SEPTOPLASTY W/ TURBINOPLASTY Bilateral 02/19/2014   Procedure: NASAL SEPTOPLASTY WITH BILATERAL TURBINATE REDUCTION;  Surgeon: Ryan Marble MD;  [place]  Service: ENT;  Laterality: Bilateral;  . NECK SURGERY  [place]  . right shoulder surgery  [id number]   cyst removed  . TEE WITHOUT CARDIOVERSION  05/05/2011   Procedure: TRANSESOPHAGEAL ECHOCARDIOGRAM (TEE);  Surgeon: [name];  [facility];  Service: Cardiovascular;  Laterality: N/A;  . TEE WITHOUT CARDIOVERSION N/A 04/21/2015   Procedure: TRANSESOPHAGEAL ECHOCARDIOGRAM (TEE);  Surgeon: [name];  [facility];  Service: Cardiovascular;  Laterality: N/A;  . THROAT SURGERY  [id number]   "thought " it was cancer but came back not  . TONSILLECTOMY       Current Outpatient Medications  Medication Sig Dispense Refill  . albuterol (PROAIR HFA) 108 (90 Base) MCG/ACT inhaler Inhale 1-2 puffs into the lungs every 6 (six) hours as needed for wheezing or shortness of breath. 1 Inhaler 5  . B-D UF III MINI PEN NEEDLES 31G X 5 MM MISC     . bisoprolol (ZEBETA) 10 MG tablet Take 10 mg by mouth daily.    . Cholecalciferol (VITAMIN D) 2000 UNITS CAPS Take 2,000 Units by mouth daily.     . diMarland Kitchentiazem (CARDIZEM CD) 120 MG 24 hr capsule TAKE 1 CAPSULE BY MOUTH DAILY 30 capsule 4  . gabapentin (NEURONTIN) 100 MG capsule Take 100 mg by mouth 2 (two) times daily.    . HYMarland KitchenROcodone-acetaminophen (NORCO) 7.5-325 MG tablet Take 1 tablet by mouth every 6 (six) hours as needed for moderate pain.    . Insulin Aspart FlexPen 100  UNIT/ML SOPN INJECT 60 UNITS INTO THE SKIN 3 TIMES DAILY WITH MEALS 54 mL 0  . insulin detemir (LEVEMIR FLEXTOUCH) 100 UNIT/ML FlexPen Inject 80 Units into the skin at bedtime. 10 pen 11  . Insulin Pen Needle (PEN NEEDLES) 30G X 8 MM MISC 1 each by Does not apply route 5 (five) times daily. E11.9 200 each 1  . liraglutide (VICTOZA) 18 MG/3ML SOPN Inject 0.3 mLs (1.8 mg total) into the skin daily. 9 mL 11  . losartan (COZAAR) 50 MG tablet Take 1 tablet (50 mg total) by mouth daily. 90 tablet 3  . metFORMIN (GLUCOPHAGE-XR) 500 MG 24 hr tablet Take 1  tablet (500 mg total) by mouth 2 (two) times daily. 180 tablet 3  . metolazone (ZAROXOLYN) 2.5 MG tablet TAKE 1 TABLET ON Tuesday AND Thursday 30 MINUTES BEFORE TAKING TORSEMIDE OR AS DIRECTED 15 tablet 3  . Multiple Vitamin (MULTIVITAMIN WITH MINERALS) TABS Take 1 tablet by mouth daily.    . nitroGLYCERIN (NITROSTAT) 0.4 MG SL tablet Place 1 tablet (0.4 mg total) under the tongue every 5 (five) minutes x 3 doses as needed for chest pain. 25 tablet 3  . omeprazole (PRILOSEC) 20 MG capsule Take 20 mg by mouth at bedtime.     . potassium chloride SA (KLOR-CON M20) 20 MEQ tablet Take 1 tablet (20 mEq total) by mouth daily. TAKE AN EXTRA TABLET ON Tuesday AND Thursday 40 tablet 3  . pravastatin (PRAVACHOL) 80 MG tablet TAKE 1 TABLET BY MOUTH DAILY AT BEDTIME (Patient taking differently: Take 40 mg by mouth daily. ) 30 tablet 10  . sildenafil (REVATIO) 20 MG tablet Take 20-80 mg by mouth daily as needed (erectile dysfunction).    . torsemide (DEMADEX) 20 MG tablet Take 2 tablets (40 mg total) by mouth daily. 90 tablet 3  . umeclidinium-vilanterol (ANORO ELLIPTA) 62.5-25 MCG/INH AEPB Inhale 1 puff into the lungs daily. 1 each 3  . Vitamin D, Ergocalciferol, (DRISDOL) 1.25 MG (50000 UT) CAPS capsule Take 50,000 Units by mouth every 7 (seven) days.    [name] warfarin (COUMADIN) 5 MG tablet TAKE 1 TO 1 & 1/2 TABLETS BY MOUTH DAILY AS DIRECTED BY THE COUMADIN CLINIC 45  tablet 4   No current facility-administered medications for this visit.    Allergies:   Adhesive [tape] and Latex    ROS:  Please see the history of present illness.   Otherwise, review of systems are positive for none.   All other systems are reviewed and negative.    PHYSICAL EXAM: VS:  BP (!) 122/58   Pulse 78   Ht 6' 3"  (1.905 m)   Wt 262 lb 4.8 oz (119 kg)   SpO2 95%   BMI 32.79 kg/m  , BMI Body mass index is 32.79 kg/m. GENERAL:  Well appearing NECK:  No jugular venous distention, waveform within normal limits, carotid upstroke brisk and symmetric, no bruits, no thyromegaly LUNGS:  Clear to auscultation bilaterally CHEST:  Unremarkable HEART:  PMI not displaced or sustained,S1 and S2 within normal limits, no S3, no S4, no clicks, no rubs, 2 out of 6 apical systolic murmur heard best throughout the precordium, no diastolic murmurs ABD:  Flat, positive bowel sounds normal in frequency in pitch, no bruits, no rebound, no guarding, no midline pulsatile mass, no hepatomegaly, no splenomegaly EXT:  2 plus pulses throughout, no edema, no cyanosis no clubbing   EKG:  EKG is  not ordered today.   Recent Labs: 07/29/2019: TSH 4.330 09/16/2019: BNP 61.2 09/24/2019: BUN 29; Creatinine, Ser 1.26; Hemoglobin 11.8; Platelets 124; Potassium 3.5; Sodium 145    Lipid Panel    Component Value Date/Time   CHOL 122 08/18/2017 0910   TRIG 400 (H) 08/18/2017 0910   HDL 29 (L) 08/18/2017 0910   CHOLHDL 4.2 08/18/2017 0910   CHOLHDL 5.3 08/27/2016 0235   VLDL 66 (H) 08/27/2016 0235   LDLCALC 13 08/18/2017 0910   LDLDIRECT 99.9 09/08/2011 0843      Wt Readings from Last 3 Encounters:  09/27/19 262 lb 4.8 oz (119 kg)  09/18/19 262 lb (118.8 kg)  09/16/19 266 lb 3.2 oz (120.7 kg)  Other studies Reviewed: Additional studies/ records that were reviewed today include: Labs Review of the above records demonstrates:  Please see elsewhere in the note.     ASSESSMENT AND  PLAN:   Edema/SOB He had normal coronaries in 2018.   However, I would have a low threshold for right and left heart catheterization.  I do think his dyspnea is probably multifactorial.  I am going to get him first to see pulmonary because he does have severe COPD.  I am going to continue his diuresis.  If he gains 3 pounds in a day he is to take his Zaroxolyn.  I will be checking a basic metabolic profile today.  He is advised again to reduce salt and fluid.   Palpitations He remains in sinus rhythm and is not noticing these.  No change in therapy.   COPD (chronic obstructive pulmonary disease) ([place]) As above he is to see Dr. Alden Hipp.  He needs to stop smoking.   Essential hypertension The blood pressure is controlled today.  No change in therapy.   Non-insulin dependent type 2 diabetes mellitus (HCC) A1c was 8.4 which is up progressively going up.  I will defer to his primary provider.   PAF (paroxysmal atrial fibrillation) (HCC) He tolerates anticoagulation.  Given the questionable GI bleed he is going to stop his Eliquis for now.  He has an appointment next week with GI.  We will be checking a CBC.   LVH: He has moderate LVH with biatrial enlargement.  PYP scan was indeterminate.  I am going to get a multiple myeloma panel.  I will have a low threshold to proceed with further amyloid work-up.  Covid Education He has had dose one of the vaccine but he is hesitant to get the second time.  Current medicines are reviewed at length with the patient today.  The patient does not have concerns regarding medicines.  The following changes have been made:  no change  Labs/ tests ordered today include: none  Orders Placed This Encounter  Procedures  . Comprehensive Metabolic Panel (CMET)  . CBC  . Multiple Myeloma Panel (SPEP&IFE w/QIG)     Disposition:   FU with APP in one week.     Signed, Ryan Breeding, MD  09/27/2019 1:26 PM    [facility]

## 2019-09-27 ENCOUNTER — Other Ambulatory Visit: Payer: Self-pay

## 2019-09-27 ENCOUNTER — Encounter: Payer: Self-pay | Admitting: Cardiology

## 2019-09-27 ENCOUNTER — Ambulatory Visit (INDEPENDENT_AMBULATORY_CARE_PROVIDER_SITE_OTHER): Payer: 59 | Admitting: Cardiology

## 2019-09-27 ENCOUNTER — Ambulatory Visit (INDEPENDENT_AMBULATORY_CARE_PROVIDER_SITE_OTHER): Payer: 59 | Admitting: Pharmacist

## 2019-09-27 VITALS — BP 122/58 | HR 78 | Ht 75.0 in | Wt 262.3 lb

## 2019-09-27 DIAGNOSIS — R0602 Shortness of breath: Secondary | ICD-10-CM | POA: Diagnosis not present

## 2019-09-27 DIAGNOSIS — I10 Essential (primary) hypertension: Secondary | ICD-10-CM | POA: Diagnosis not present

## 2019-09-27 DIAGNOSIS — I480 Paroxysmal atrial fibrillation: Secondary | ICD-10-CM

## 2019-09-27 DIAGNOSIS — R002 Palpitations: Secondary | ICD-10-CM | POA: Diagnosis not present

## 2019-09-27 DIAGNOSIS — Z7901 Long term (current) use of anticoagulants: Secondary | ICD-10-CM

## 2019-09-27 DIAGNOSIS — E118 Type 2 diabetes mellitus with unspecified complications: Secondary | ICD-10-CM | POA: Diagnosis not present

## 2019-09-27 DIAGNOSIS — I4892 Unspecified atrial flutter: Secondary | ICD-10-CM

## 2019-09-27 DIAGNOSIS — Z7189 Other specified counseling: Secondary | ICD-10-CM

## 2019-09-27 LAB — POCT INR: INR: 5.9 — AB (ref 2.0–3.0)

## 2019-09-27 NOTE — Patient Instructions (Addendum)
Medication Instructions:   HOLD Warfarin/Coumadin until your follow up appointment with Gastroenterology (GI) on. Call Coumadin clinic at [phone] to schedule INR check after GI appointment  *If you need a refill on your cardiac medications before your next appointment, please call your pharmacy*  Lab Work:  Your physician recommends that you WHEN CONVENIENT  If you have labs (blood work) drawn today and your tests are completely normal, you will receive your results only by: [name] MyChart Message (if you have MyChart) OR . A paper copy in the mail If you have any lab test that is abnormal or we need to change your treatment, we will call you to review the results.   Follow-Up: At [facility], you and your health needs are our priority.  As part of our continuing mission to provide you with exceptional heart care, we have created designated Provider Care Teams.  These Care Teams include your primary Cardiologist (physician) and Advanced Practice Providers (APPs -  Physician Assistants and Nurse Practitioners) who all work together to provide you with the care you need, when you need it.  We recommend signing up for the patient portal called "MyChart".  Sign up information is provided on this After Visit Summary.  MyChart is used to connect with patients for Virtual Visits (Telemedicine).  Patients are able to view lab/test results, encounter notes, upcoming appointments, etc.  Non-urgent messages can be sent to your provider as well.   To learn more about what you can do with MyChart, go to [email or web address].    Your next appointment:   6 week(s)  The format for your next appointment:   In Person  Provider:   Minus Breeding, MD  Other Instructions  FOLLOW UP WITH PULMONARY DOCTOR

## 2019-09-27 NOTE — Patient Instructions (Signed)
HOLD warfarin for now until f/u with gastroenterology.Call Coumadin Clinic at [phone] to arrange INR check after GI give okay to resume warfarin.

## 2019-09-30 ENCOUNTER — Telehealth: Payer: Self-pay

## 2019-09-30 ENCOUNTER — Ambulatory Visit: Payer: 59 | Admitting: Adult Health

## 2019-09-30 ENCOUNTER — Ambulatory Visit (HOSPITAL_COMMUNITY)
Admission: RE | Admit: 2019-09-30 | Discharge: 2019-09-30 | Disposition: A | Discharge status: Home / Self Care | Payer: 59 | Source: Ambulatory Visit | Attending: Cardiovascular Disease | Admitting: Cardiovascular Disease

## 2019-09-30 ENCOUNTER — Other Ambulatory Visit: Payer: Self-pay

## 2019-09-30 DIAGNOSIS — R0989 Other specified symptoms and signs involving the circulatory and respiratory systems: Secondary | ICD-10-CM | POA: Diagnosis not present

## 2019-09-30 NOTE — Telephone Encounter (Signed)
Spoke to patient after doppler this morning.Stated he saw Dr.Hochrein this past Friday 5/14.Stated Dr.Hochrein advised ok to take a extra fluid medication if he gains weight.Stated he wanted to know which fluid medication.Stated he is taking Torsemide 20 mg 2 tablets twice a day and Metolazone 2.5 mg on Tue and Thurs.Advised to take a extra Torsemide 20 mg.Message sent to Dr.Hochrein for review.

## 2019-09-30 NOTE — Telephone Encounter (Signed)
Agree 

## 2019-10-01 ENCOUNTER — Other Ambulatory Visit: Payer: Self-pay

## 2019-10-01 DIAGNOSIS — Z79899 Other long term (current) drug therapy: Secondary | ICD-10-CM

## 2019-10-01 NOTE — Progress Notes (Signed)
BMP order placed for 2 weeks per Dr. Rosezella [place] result note.

## 2019-10-02 LAB — MULTIPLE MYELOMA PANEL, SERUM
Albumin SerPl Elph-Mcnc: 3.6 g/dL (ref 2.9–4.4)
Albumin/Glob SerPl: 1.2 (ref 0.7–1.7)
Alpha 1: 0.2 g/dL (ref 0.0–0.4)
Alpha2 Glob SerPl Elph-Mcnc: 0.8 g/dL (ref 0.4–1.0)
B-Globulin SerPl Elph-Mcnc: 1.1 g/dL (ref 0.7–1.3)
Gamma Glob SerPl Elph-Mcnc: 1.1 g/dL (ref 0.4–1.8)
Globulin, Total: 3.2 g/dL (ref 2.2–3.9)
IgA/Immunoglobulin A, Serum: 211 mg/dL (ref 61–437)
IgG (Immunoglobin G), Serum: 973 mg/dL (ref 603–1613)
IgM (Immunoglobulin M), Srm: 164 mg/dL (ref 20–172)

## 2019-10-02 LAB — CBC
Hematocrit: 35.2 % — ABNORMAL LOW (ref 37.5–51.0)
Hemoglobin: 11.8 g/dL — ABNORMAL LOW (ref 13.0–17.7)
MCH: 30.3 pg (ref 26.6–33.0)
MCHC: 33.5 g/dL (ref 31.5–35.7)
MCV: 91 fL (ref 79–97)
Platelets: 143 10*3/uL — ABNORMAL LOW (ref 150–450)
RBC: 3.89 x10E6/uL — ABNORMAL LOW (ref 4.14–5.80)
RDW: 15.3 % (ref 11.6–15.4)
WBC: 6.5 10*3/uL (ref 3.4–10.8)

## 2019-10-02 LAB — COMPREHENSIVE METABOLIC PANEL
ALT: 28 IU/L (ref 0–44)
AST: 35 IU/L (ref 0–40)
Albumin/Globulin Ratio: 1.4 (ref 1.2–2.2)
Albumin: 4 g/dL (ref 3.8–4.8)
Alkaline Phosphatase: 158 IU/L — ABNORMAL HIGH (ref 39–117)
BUN/Creatinine Ratio: 20 (ref 10–24)
BUN: 30 mg/dL — ABNORMAL HIGH (ref 8–27)
Bilirubin Total: 0.3 mg/dL (ref 0.0–1.2)
CO2: 27 mmol/L (ref 20–29)
Calcium: 9.1 mg/dL (ref 8.6–10.2)
Chloride: 98 mmol/L (ref 96–106)
Creatinine, Ser: 1.52 mg/dL — ABNORMAL HIGH (ref 0.76–1.27)
GFR calc Af Amer: 56 mL/min/{1.73_m2} — ABNORMAL LOW (ref >59)
GFR calc non Af Amer: 49 mL/min/{1.73_m2} — ABNORMAL LOW (ref >59)
Globulin, Total: 2.8 g/dL (ref 1.5–4.5)
Glucose: 240 mg/dL — ABNORMAL HIGH (ref 65–99)
Potassium: 3.6 mmol/L (ref 3.5–5.2)
Sodium: 142 mmol/L (ref 134–144)
Total Protein: 6.8 g/dL (ref 6.0–8.5)

## 2019-10-03 ENCOUNTER — Ambulatory Visit (INDEPENDENT_AMBULATORY_CARE_PROVIDER_SITE_OTHER): Payer: Self-pay

## 2019-10-03 ENCOUNTER — Other Ambulatory Visit: Payer: Self-pay | Admitting: Gastroenterology

## 2019-10-03 ENCOUNTER — Encounter: Payer: Self-pay | Admitting: Gastroenterology

## 2019-10-03 ENCOUNTER — Other Ambulatory Visit (INDEPENDENT_AMBULATORY_CARE_PROVIDER_SITE_OTHER): Payer: 59

## 2019-10-03 ENCOUNTER — Ambulatory Visit (INDEPENDENT_AMBULATORY_CARE_PROVIDER_SITE_OTHER): Payer: 59 | Admitting: Gastroenterology

## 2019-10-03 ENCOUNTER — Other Ambulatory Visit: Payer: Self-pay

## 2019-10-03 VITALS — BP 122/52 | HR 82 | Ht 75.0 in | Wt 264.2 lb

## 2019-10-03 DIAGNOSIS — K7460 Unspecified cirrhosis of liver: Secondary | ICD-10-CM

## 2019-10-03 DIAGNOSIS — D649 Anemia, unspecified: Secondary | ICD-10-CM

## 2019-10-03 DIAGNOSIS — Z794 Long term (current) use of insulin: Secondary | ICD-10-CM

## 2019-10-03 DIAGNOSIS — R195 Other fecal abnormalities: Secondary | ICD-10-CM | POA: Insufficient documentation

## 2019-10-03 DIAGNOSIS — E119 Type 2 diabetes mellitus without complications: Secondary | ICD-10-CM

## 2019-10-03 DIAGNOSIS — Z1159 Encounter for screening for other viral diseases: Secondary | ICD-10-CM

## 2019-10-03 LAB — IBC + FERRITIN
Ferritin: 47.4 ng/mL (ref 22.0–322.0)
Iron: 68 ug/dL (ref 42–165)
Saturation Ratios: 18.6 % — ABNORMAL LOW (ref 20.0–50.0)
Transferrin: 261 mg/dL (ref 212.0–360.0)

## 2019-10-03 LAB — VITAMIN B12: Vitamin B-12: 765 pg/mL (ref 211–911)

## 2019-10-03 LAB — FOLATE: Folate: >23.7 ng/mL (ref >5.9)

## 2019-10-03 MED ORDER — OMEPRAZOLE 40 MG PO CPDR
40.0000 mg | DELAYED_RELEASE_CAPSULE | Freq: Every day | ORAL | 3 refills | Status: DC
Start: 2019-10-03 — End: 2019-10-03

## 2019-10-03 MED ORDER — OMEPRAZOLE 40 MG PO CPDR
40.0000 mg | DELAYED_RELEASE_CAPSULE | Freq: Two times a day (BID) | ORAL | 3 refills | Status: DC
Start: 2019-10-03 — End: 2020-01-06

## 2019-10-03 NOTE — Patient Instructions (Addendum)
If you are age 62 or older, your body mass index should be between 23-30. Your Body mass index is 33.03 kg/m. If this is out of the aforementioned range listed, please consider follow up with your Primary Care Provider.  If you are age 55 or younger, your body mass index should be between 19-25. Your Body mass index is 33.03 kg/m. If this is out of the aformentioned range listed, please consider follow up with your Primary Care Provider.   Your provider has requested that you go to the basement level for lab work before leaving today. Press "B" on the elevator. The lab is located at the first door on the left as you exit the elevator.  You have been scheduled for an abdominal ultrasound at [facility] (1st floor of hospital) on Thursday 10/10/19 at 9 am. Please arrive 15 minutes prior to your appointment for registration. Make certain not to have anything to eat or drink 6 hours prior to your appointment. Should you need to reschedule your appointment, please contact radiology at [phone]. This test typically takes about 30 minutes to perform.   We have sent the following medications to your pharmacy for you to pick up at your convenience: Omeprazole 40 mg twice daily.   You have been scheduled for an endoscopy. Please follow written instructions given to you at your visit today. If you use inhalers (even only as needed), please bring them with you on the day of your procedure.

## 2019-10-03 NOTE — Progress Notes (Signed)
10/03/2019 WOODARD PERRELL [id number] [birth year]   HISTORY OF PRESENT ILLNESS: This is a 64 year old male who is a patient of Dr. Ardis Hughs.  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  Has been sent here by cardiology for concern of decrease in hemoglobin.  Hemoglobin mildly low at 11.8 g.  It appears that over the last year hemoglobin previously ranged between the 12 and 13 g range.  He also reports frequent dark stools.  Platelets mildly low at 143.  MCV is normal at 91.  INR supratherapeutic on Coumadin recently.  LFTs normal except for elevated alk phos at 158.  Due to concern for possible GI bleeding and his recent supratherapeutic INR level his Coumadin has currently been put on hold by cardiology.  Colonoscopy October 2019 showed 3 polyps that were removed, one was a tubular adenoma, another sessile serrated polyp, and other was benign colonic mucosa.  Repeat recommended in 5 years.  EGD October 2019 showed only gastritis, biopsies negative for H. pylori.  Repeat EGD recommended 3-year interval for esophageal varices surveillance.  He is being fluid managed by cardiology and is currently on torsemide 40 mg twice daily.  They try to follow low-sodium diet and they monitor his weights regularly.  Past Medical History:  Diagnosis Date  . Arthritis    lower back  . Asthma   . Atrial flutter ([place])    s/p CTI ablation by Dr Rayann Heman  . Brain aneurysm 2009   questionable. A follow up CTA in 2009 showed no evidence of  . Chronic back pain    DDD/stenosis  . Colon polyps    9 polyps removed 10/13/11  . Complication of anesthesia September 11, 2012   slow to awaken after ablation  . COPD (chronic obstructive pulmonary disease) ([place])   . Diabetes mellitus    takes Metformin and Glimepiride daily  . Emphysema   . GERD (gastroesophageal reflux disease)    takes Omeprazole bid  . History of shingles   . HLD  (hyperlipidemia)    takes Pravastatin daily  . HTN (hypertension)    takes Prinizide daily  . Obesity   . OSA (obstructive sleep apnea)    not always using cpap  . Overdose 2009   unintentional Flecanide overdose  . Peripheral neuropathy   . Short-term memory loss   . Tobacco abuse    Past Surgical History:  Procedure Laterality Date  . ANTERIOR CERVICAL DECOMP/DISCECTOMY FUSION  01/04/2012   Procedure: ANTERIOR CERVICAL DECOMPRESSION/DISCECTOMY FUSION 1 LEVEL/HARDWARE REMOVAL;  Surgeon: Ophelia Charter, MD;  Location: [place] [facility]S;  Service: Neurosurgery;  Laterality: N/A;  explore cervical fusion Cervical six - seven  with removal of codman plate anterior cervical decompression with fusion interbody prothesis plating and bonegraft  . APPENDECTOMY  2-68yr ago  . ATRIAL FIBRILLATION ABLATION N/A 09/11/2012   PT DID NOT HAVE AN ATRIAL FIBRILLATION ABLATION IN 2014!  ATRIAL FLUTTER ABLATION ONLY  . ATRIAL FLUTTER ABLATION  09/11/2012   CTI ablation by Dr ARayann Heman . CARDIAC CATHETERIZATION  2008   no significant CAD  . CARDIOVERSION  05/05/2011   Procedure: CARDIOVERSION;  Surgeon: BLelon Perla MD;  [facility];  Service: Cardiovascular;  Laterality: N/A;  . CARDIOVERSION Bilateral 07/26/2012   Procedure: CARDIOVERSION;  Surgeon: JMinus Breeding MD;  [facility];  Service: Cardiovascular;  Laterality: Bilateral;  . CARPAL TUNNEL RELEASE  99/2000   bilateral  .  COLONOSCOPY WITH PROPOFOL N/A 12/13/2012   Procedure: COLONOSCOPY WITH PROPOFOL;  Surgeon: Milus Banister, MD;  Location: [facility];  Service: Endoscopy;  Laterality: N/A;  . ELECTROPHYSIOLOGIC STUDY N/A 04/21/2015   Procedure: Atrial Fibrillation Ablation;  Surgeon: Thompson Grayer, MD;  Location: [place] CV LAB;  Service: Cardiovascular;  Laterality: N/A;  . HERNIA REPAIR    . KNEE SURGERY  6-77yr ago   left  . LEFT HEART CATH AND CORONARY ANGIOGRAPHY N/A 09/19/2016   Procedure: Left Heart Cath and  Coronary Angiography;  Surgeon: SBelva Crome MD;  [facility];  Service: Cardiovascular;  Laterality: N/A;  . Left inguinal hernia repair     as a child  . NASAL SEPTOPLASTY W/ TURBINOPLASTY Bilateral 02/19/2014   Procedure: NASAL SEPTOPLASTY WITH BILATERAL TURBINATE REDUCTION;  Surgeon: KJodi Marble MD;  [place] [facility]  Service: [place];  Laterality: Bilateral;  . NECK SURGERY  130yrago  . right shoulder surgery  4-5y29yrgo   cyst removed  . TEE WITHOUT CARDIOVERSION  05/05/2011   Procedure: TRANSESOPHAGEAL ECHOCARDIOGRAM (TEE);  Surgeon: BriLelon PerlaD;  [facility];  Service: Cardiovascular;  Laterality: N/A;  . TEE WITHOUT CARDIOVERSION N/A 04/21/2015   Procedure: TRANSESOPHAGEAL ECHOCARDIOGRAM (TEE);  Surgeon: TraSueanne MargaritaD;  [facility];  Service: Cardiovascular;  Laterality: N/A;  . THROAT SURGERY  4-69yr44yro   "thought " it was cancer but came back not  . TONSILLECTOMY      reports that he has been smoking cigarettes. He has a 49.00 pack-year smoking history. He has never used smokeless tobacco. He reports current alcohol use. He reports that he does not use drugs. family history includes Diabetes in his maternal grandmother and mother; Heart disease in his father; Lung cancer in his father. Allergies  Allergen Reactions  . Adhesive [Tape]     itching  . Latex Itching    When tape is on the skin too long skin gets red & itching      Outpatient Encounter Medications as of 10/03/2019  Medication Sig  . albuterol (PROAIR HFA) 108 (90 Base) MCG/ACT inhaler Inhale 1-2 puffs into the lungs every 6 (six) hours as needed for wheezing or shortness of breath.  . B-D UF III MINI PEN NEEDLES 31G X 5 MM MISC   . bisoprolol (ZEBETA) 10 MG tablet Take 10 mg by mouth daily.  . Cholecalciferol (VITAMIN D) 2000 UNITS CAPS Take 2,000 Units by mouth daily.   . diMarland Kitchentiazem (CARDIZEM CD) 120 MG 24 hr capsule TAKE 1 CAPSULE BY MOUTH DAILY  .  gabapentin (NEURONTIN) 100 MG capsule Take 100 mg by mouth 2 (two) times daily.  . HYMarland KitchenROcodone-acetaminophen (NORCO) 7.5-325 MG tablet Take 1 tablet by mouth every 6 (six) hours as needed for moderate pain.  . Insulin Aspart FlexPen 100 UNIT/ML SOPN INJECT 60 UNITS INTO THE SKIN 3 TIMES DAILY WITH MEALS  . insulin detemir (LEVEMIR FLEXTOUCH) 100 UNIT/ML FlexPen Inject 80 Units into the skin at bedtime.  . Insulin Pen Needle (PEN NEEDLES) 30G X 8 MM MISC 1 each by Does not apply route 5 (five) times daily. E11.9  . liraglutide (VICTOZA) 18 MG/3ML SOPN Inject 0.3 mLs (1.8 mg total) into the skin daily.  . loMarland Kitchenartan (COZAAR) 50 MG tablet Take 1 tablet (50 mg total) by mouth daily.  . metFORMIN (GLUCOPHAGE-XR) 500 MG 24 hr tablet Take 1 tablet (500 mg total) by mouth 2 (two) times daily.  . metolazone (ZAROXOLYN)  2.5 MG tablet TAKE 1 TABLET ON Tuesday AND Thursday 30 MINUTES BEFORE TAKING TORSEMIDE OR AS DIRECTED  . Multiple Vitamin (MULTIVITAMIN WITH MINERALS) TABS Take 1 tablet by mouth daily.  . nitroGLYCERIN (NITROSTAT) 0.4 MG SL tablet Place 1 tablet (0.4 mg total) under the tongue every 5 (five) minutes x 3 doses as needed for chest pain.  [name] omeprazole (PRILOSEC) 20 MG capsule Take 20 mg by mouth at bedtime.   . potassium chloride SA (KLOR-CON M20) 20 MEQ tablet Take 1 tablet (20 mEq total) by mouth daily. TAKE AN EXTRA TABLET ON Tuesday AND Thursday  . pravastatin (PRAVACHOL) 80 MG tablet TAKE 1 TABLET BY MOUTH DAILY AT BEDTIME (Patient taking differently: Take 40 mg by mouth daily. )  . sildenafil (REVATIO) 20 MG tablet Take 20-80 mg by mouth daily as needed (erectile dysfunction).  . torsemide (DEMADEX) 20 MG tablet Take 2 tablets ( 40 mg ) twice a day  . umeclidinium-vilanterol (ANORO ELLIPTA) 62.5-25 MCG/INH AEPB Inhale 1 puff into the lungs daily.  . Vitamin D, Ergocalciferol, (DRISDOL) 1.25 MG (50000 UT) CAPS capsule Take 50,000 Units by mouth every 7 (seven) days.  [name] warfarin (COUMADIN) 5  MG tablet TAKE 1 TO 1 & 1/2 TABLETS BY MOUTH DAILY AS DIRECTED BY THE COUMADIN CLINIC   No facility-administered encounter medications on file as of 10/03/2019.    REVIEW OF SYSTEMS  : All other systems reviewed and negative except where noted in the History of Present Illness.  PHYSICAL EXAM: There were no vitals taken for this visit. General: Well developed white male in no acute distress Head: Normocephalic and atraumatic Eyes:  Sclerae anicteric, conjunctiva pink. Ears: Normal auditory acuity Lungs: Clear throughout to auscultation; no increased WOB. Heart: Regular rate and rhythm Abdomen: Soft, rotund, flanks bulging.  Suspect some ascites.  BS present.  Mild diffuse TTP. Rectal:  No external abnormalities noted, but here was some stool noted in the perianal area.  DRE did not reveal any masses.  Light brown, trace heme positive stool on exam glove. Musculoskeletal: Symmetrical with no gross deformities  Skin: No lesions on visible extremities Extremities: 1+ pitting edema in B/L LE's Neurological: Alert oriented x 4, grossly non-focal Cervical Nodes:  No significant cervical adenopathy Psychological:  Alert and cooperative. Normal mood and affect  ASSESSMENT AND PLAN: *64 year old male sent here by cardiology for concern about drop in hemoglobin.  Hemoglobin 11.8 g and appears that it has bounced around between the 12 and 13 g range, but this is the lowest that it has been.  He reports dark stools as well.  He is currently off of his Coumadin/blood thinning medications for now due to concern for bleeding and also his INR had been supratherapeutic despite his regular dosing.  He was trace heme positive today with light brown stool.  Just had EGD and colonoscopy in October 2019.  EGD showed gastritis and colonoscopy revealed few polyps that were removed.  This is somewhat of a difficult situation.  I am not sure that repeat EGD is going to yield much, but with him currently being off of  his blood thinners and potential need to resume those at some point I think that we take the opportunity to repeat EGD.  If nothing is found there and hemoglobin continues to decline with evidence of bleeding then may need video capsule endoscopy.  Will check iron studies, vitamin B12, and folate levels.  Due to timing restraints, will plan for EGD with Dr. Silverio Decamp since  she has sooner availability.  The risks, benefits, and alternatives to EGD were discussed with the patient and he consents to proceed.  I am going to increase his omeprazole to 40 mg BID for now as well.  New prescription sent to pharmacy. *Cirrhosis: Thought likely due to previously heavy ETOH use.  Needs [place] surveillance.  We will plan for abdominal ultrasound and AFP level.  Abdominal ultrasound also to assess for ascites.  He is on torsemide 40 mg twice daily per cardiology.  Low sodium diet discussed again and they do try to follow that.  Continue daily weights.  Will also need Hep A and B vaccines as well in the future (did not realize this until after his visit today). *IDDM:  Insulin will be adjusted prior to endoscopic procedure per protocol. Will resume normal dosing after procedure.   CC:  Timoteo Gaul, FNP

## 2019-10-04 ENCOUNTER — Other Ambulatory Visit: Payer: Self-pay

## 2019-10-04 ENCOUNTER — Other Ambulatory Visit: Payer: Self-pay | Admitting: *Deleted

## 2019-10-04 ENCOUNTER — Telehealth: Payer: Self-pay | Admitting: *Deleted

## 2019-10-04 DIAGNOSIS — R195 Other fecal abnormalities: Secondary | ICD-10-CM

## 2019-10-04 DIAGNOSIS — Z79899 Other long term (current) drug therapy: Secondary | ICD-10-CM

## 2019-10-04 DIAGNOSIS — K7460 Unspecified cirrhosis of liver: Secondary | ICD-10-CM

## 2019-10-04 DIAGNOSIS — D649 Anemia, unspecified: Secondary | ICD-10-CM

## 2019-10-04 LAB — AFP TUMOR MARKER: AFP-Tumor Marker: 2.8 ng/mL (ref <6.1)

## 2019-10-04 LAB — SARS CORONAVIRUS 2 (TAT 6-24 HRS): SARS Coronavirus 2: NEGATIVE

## 2019-10-04 NOTE — Telephone Encounter (Addendum)
-----   Message from Loralie Champagne, PA-C sent at 10/04/2019  8:46 AM EDT ----- Dr. Ardis Hughs wants the patient done at [facility] hospital on his next available date in case he has to band varices, etc.  But he thinks he is ok to wait until then.  Please see Dr. Ardis Hughs note/addendum to my note.

## 2019-10-04 NOTE — Telephone Encounter (Signed)
Scheduled patient for EGD on Thursday 11/07/19 @ 8:30 am at [facility] hospital.

## 2019-10-04 NOTE — Progress Notes (Signed)
OK to arrange for hospital EGD, it is going to be a few weeks however.  His Hb drop was quite minor.  I think it is safe for him to resume his blood thinners in the interim and hold per protocol prior to hospital EGD. Please put on for my next available Thursday hosp case (mid June or so), talk with patty or nicole.

## 2019-10-04 NOTE — Progress Notes (Addendum)
Patient is established with Dr Eugenia Pancoast and he has also seen Dr Earlean Shawl ([place]) in the interim prior to this office visit.  Given h/o cirrhosis, melena, drop in Hgb, elevated BUN concerning for possible upper GI bleed , its more appropriate to schedule the EGD at the hospital instead of [place] if he needs additional interventions/?esophageal varices banding. Please check which provider has availability to do the case sooner at hospital endo unit.

## 2019-10-07 ENCOUNTER — Encounter: Payer: Self-pay | Admitting: Podiatry

## 2019-10-07 ENCOUNTER — Telehealth: Payer: Self-pay | Admitting: Endocrinology

## 2019-10-07 ENCOUNTER — Other Ambulatory Visit: Payer: Self-pay

## 2019-10-07 ENCOUNTER — Ambulatory Visit (INDEPENDENT_AMBULATORY_CARE_PROVIDER_SITE_OTHER): Payer: 59 | Admitting: Podiatry

## 2019-10-07 DIAGNOSIS — M79676 Pain in unspecified toe(s): Secondary | ICD-10-CM

## 2019-10-07 DIAGNOSIS — E0843 Diabetes mellitus due to underlying condition with diabetic autonomic (poly)neuropathy: Secondary | ICD-10-CM | POA: Diagnosis not present

## 2019-10-07 DIAGNOSIS — B351 Tinea unguium: Secondary | ICD-10-CM | POA: Diagnosis not present

## 2019-10-07 DIAGNOSIS — L989 Disorder of the skin and subcutaneous tissue, unspecified: Secondary | ICD-10-CM | POA: Diagnosis not present

## 2019-10-07 NOTE — Telephone Encounter (Signed)
Please advise about below

## 2019-10-07 NOTE — Telephone Encounter (Signed)
Patient came into the office thinking he had an appointment.  Was advise that he did not have one until 11/07/2019.  Would still like assistance/call back regarding daily high blood sugars - 250-300?

## 2019-10-07 NOTE — Telephone Encounter (Signed)
Can you help to see if Dr. Loanne Drilling has any cancellations. He is requesting to move this pt up.

## 2019-10-07 NOTE — Telephone Encounter (Signed)
Please offer to move up next appt to next avail

## 2019-10-08 ENCOUNTER — Encounter: Payer: Self-pay | Admitting: Endocrinology

## 2019-10-08 ENCOUNTER — Encounter: Payer: Self-pay | Admitting: Gastroenterology

## 2019-10-08 ENCOUNTER — Ambulatory Visit (INDEPENDENT_AMBULATORY_CARE_PROVIDER_SITE_OTHER): Payer: 59 | Admitting: Endocrinology

## 2019-10-08 VITALS — BP 104/50 | HR 79 | Ht 75.0 in | Wt 264.0 lb

## 2019-10-08 DIAGNOSIS — E119 Type 2 diabetes mellitus without complications: Secondary | ICD-10-CM | POA: Diagnosis not present

## 2019-10-08 DIAGNOSIS — Z794 Long term (current) use of insulin: Secondary | ICD-10-CM

## 2019-10-08 LAB — POCT GLYCOSYLATED HEMOGLOBIN (HGB A1C): Hemoglobin A1C: 8.1 % — AB (ref 4.0–5.6)

## 2019-10-08 MED ORDER — LEVEMIR FLEXTOUCH 100 UNIT/ML ~~LOC~~ SOPN: 90.0000 [IU] | PEN_INJECTOR | Freq: Every day | SUBCUTANEOUS | 11 refills | Status: DC

## 2019-10-08 NOTE — H&P (View-Only) (Signed)
Subjective:    Patient ID: [name], male    DOB: Jan 08, 1956, 64 y.o.   MRN: [id number]  HPI Pt returns for f/u of diabetes mellitus:  DM type: Insulin-requiring type 2 Dx'ed: 0000000 Complications: none Therapy: insulin since 2019, metformin, and Victoza.  DKA: never Severe hypoglycemia: never Pancreatitis: never Pancreatic imaging: normal on 2017 CT SDOH: none Other: he takes multiple daily injections; he did not tolerate Jardiance (dehydration); metformin dosage is limited by abd pain.    Interval history: Pt says he seldom misses the insulin.  He says he would remember the Levemir better if could take at supper.  no cbg record, but states cbg's vary from 118-350.  It is in general lowest in the afternoon.  it is in general highest fasting (higher than at HS, even if no HS-snack).   Main symptom is fatigue.   Past Medical History:  Diagnosis Date  . Arthritis    lower back  . Asthma   . Atrial flutter ([place])    s/p CTI ablation by Dr Rayann Heman  . Brain aneurysm 2009   questionable. A follow up CTA in 2009 showed no evidence of  . Chronic back pain    DDD/stenosis  . Colon polyps    9 polyps removed 10/13/11  . Complication of anesthesia September 11, 2012   slow to awaken after ablation  . COPD (chronic obstructive pulmonary disease) ([place])   . Diabetes mellitus    takes Metformin and Glimepiride daily  . Emphysema   . GERD (gastroesophageal reflux disease)    takes Omeprazole bid  . History of shingles   . HLD (hyperlipidemia)    takes Pravastatin daily  . HTN (hypertension)    takes Prinizide daily  . Obesity   . OSA (obstructive sleep apnea)    not always using cpap  . Overdose 2009   unintentional Flecanide overdose  . Peripheral neuropathy   . Short-term memory loss   . Tobacco abuse     Past Surgical History:  Procedure Laterality Date  . ANTERIOR CERVICAL DECOMP/DISCECTOMY FUSION  01/04/2012   Procedure: ANTERIOR CERVICAL DECOMPRESSION/DISCECTOMY FUSION 1  LEVEL/HARDWARE REMOVAL;  Surgeon: Ophelia Charter, MD;  Location: [place] [facility]UR[facility] ORS;  Service: Neurosurgery;  Laterality: N/A;  explore cervical fusion Cervical six - seven  with removal of codman plate anterior cervical decompression with fusion interbody prothesis plating and bonegraft  . APPENDECTOMY  2-65yrs ago  . ATRIAL FIBRILLATION ABLATION N/A 09/11/2012   PT DID NOT HAVE AN ATRIAL FIBRILLATION ABLATION IN 2014!  ATRIAL FLUTTER ABLATION ONLY  . ATRIAL FLUTTER ABLATION  09/11/2012   CTI ablation by Dr Rayann Heman  . CARDIAC CATHETERIZATION  2008   no significant CAD  . CARDIOVERSION  05/05/2011   Procedure: CARDIOVERSION;  Surgeon: Lelon Perla, MD;  Location: [facility] ENDOSCOPY;  Service: Cardiovascular;  Laterality: N/A;  . CARDIOVERSION Bilateral 07/26/2012   Procedure: CARDIOVERSION;  Surgeon: Minus Breeding, MD;  Location: [facility] ENDOSCOPY;  Service: Cardiovascular;  Laterality: Bilateral;  . CARPAL TUNNEL RELEASE  99/2000   bilateral  . COLONOSCOPY WITH PROPOFOL N/A 12/13/2012   Procedure: COLONOSCOPY WITH PROPOFOL;  Surgeon: Milus Banister, MD;  Location: [facility] ENDOSCOPY;  Service: Endoscopy;  Laterality: N/A;  . ELECTROPHYSIOLOGIC STUDY N/A 04/21/2015   Procedure: Atrial Fibrillation Ablation;  Surgeon: Thompson Grayer, MD;  Location: [place] CV LAB;  Service: Cardiovascular;  Laterality: N/A;  . HERNIA REPAIR    . KNEE SURGERY  6-35yrs ago   left  .  LEFT HEART CATH AND CORONARY ANGIOGRAPHY N/A 09/19/2016   Procedure: Left Heart Cath and Coronary Angiography;  Surgeon: Belva Crome, MD;  Location: [place] CV LAB;  Service: Cardiovascular;  Laterality: N/A;  . Left inguinal hernia repair     as a child  . NASAL SEPTOPLASTY W/ TURBINOPLASTY Bilateral 02/19/2014   Procedure: NASAL SEPTOPLASTY WITH BILATERAL TURBINATE REDUCTION;  Surgeon: Jodi Marble, MD;  Location: [place];  Service: ENT;  Laterality: Bilateral;  . NECK SURGERY  61yrs ago  . right shoulder surgery  4-45yrs ago   cyst removed  .  TEE WITHOUT CARDIOVERSION  05/05/2011   Procedure: TRANSESOPHAGEAL ECHOCARDIOGRAM (TEE);  Surgeon: Lelon Perla, MD;  Location: [facility] ENDOSCOPY;  Service: Cardiovascular;  Laterality: N/A;  . TEE WITHOUT CARDIOVERSION N/A 04/21/2015   Procedure: TRANSESOPHAGEAL ECHOCARDIOGRAM (TEE);  Surgeon: Sueanne Margarita, MD;  Location: [facility] ENDOSCOPY;  Service: Cardiovascular;  Laterality: N/A;  . THROAT SURGERY  4-29yrs ago   "thought " it was cancer but came back not  . TONSILLECTOMY      Social History   Socioeconomic History  . Marital status: Married    Spouse name: Not on file  . Number of children: 2  . Years of education: Not on file  . Highest education level: Not on file  Occupational History  . Occupation: Dealer   Tobacco Use  . Smoking status: Current Every Day Smoker    Packs/day: 1.00    Years: 49.00    Pack years: 49.00    Types: Cigarettes  . Smokeless tobacco: Never Used  . Tobacco comment: He is going to try 1-800-QUIT Now for nicotine replacement therapy  Substance and Sexual Activity  . Alcohol use: Not Currently    Alcohol/week: 0.0 standard drinks    Comment: 12/05/17 pt stated he has drinked 1/2 drink in 3 years  . Drug use: No  . Sexual activity: Yes  Other Topics Concern  . Not on file  Social History Narrative   Daily caffeine(Mountain Dew)       Lives in [place] with spouse.   Unemployed due to chronic back/ leg pain         Social Determinants of Health   Financial Resource Strain:   . Difficulty of Paying Living Expenses:   Food Insecurity:   . Worried About Charity fundraiser in the Last Year:   . Arboriculturist in the Last Year:   Transportation Needs:   . Film/video editor (Medical):   [name] Lack of Transportation (Non-Medical):   Physical Activity:   . Days of Exercise per Week:   . Minutes of Exercise per Session:   Stress:   . Feeling of Stress :   Social Connections:   . Frequency of Communication with Friends and Family:     . Frequency of Social Gatherings with Friends and Family:   . Attends Religious Services:   . Active Member of Clubs or Organizations:   . Attends Archivist Meetings:   [name] Marital Status:   Intimate Partner Violence:   . Fear of Current or Ex-Partner:   . Emotionally Abused:   [name] Physically Abused:   . Sexually Abused:     Current Outpatient Medications on File Prior to Visit  Medication Sig Dispense Refill  . albuterol (PROAIR HFA) 108 (90 Base) MCG/ACT inhaler Inhale 1-2 puffs into the lungs every 6 (six) hours as needed for wheezing or shortness of breath. 1 Inhaler 5  .  Ascorbic Acid (VITAMIN C PO) Take 1 tablet by mouth daily.    [name] b complex vitamins tablet Take 1 tablet by mouth daily.    . B-D UF III MINI PEN NEEDLES 31G X 5 MM MISC     . bisoprolol (ZEBETA) 10 MG tablet Take 10 mg by mouth daily.    [name] diltiazem (CARDIZEM CD) 120 MG 24 hr capsule TAKE 1 CAPSULE BY MOUTH DAILY 30 capsule 4  . gabapentin (NEURONTIN) 100 MG capsule Take 100 mg by mouth as directed. Take 1 tablet in the am and 2 tablets in the evening    . HYDROcodone-acetaminophen (NORCO) 7.5-325 MG tablet Take 1 tablet by mouth every 6 (six) hours as needed for moderate pain.    . Insulin Aspart FlexPen 100 UNIT/ML SOPN INJECT 60 UNITS INTO THE SKIN 3 TIMES DAILY WITH MEALS 54 mL 0  . Insulin Pen Needle (PEN NEEDLES) 30G X 8 MM MISC 1 each by Does not apply route 5 (five) times daily. E11.9 200 each 1  . liraglutide (VICTOZA) 18 MG/3ML SOPN Inject 0.3 mLs (1.8 mg total) into the skin daily. 9 mL 11  . losartan (COZAAR) 50 MG tablet Take 1 tablet (50 mg total) by mouth daily. 90 tablet 3  . metFORMIN (GLUCOPHAGE-XR) 500 MG 24 hr tablet Take 1 tablet (500 mg total) by mouth 2 (two) times daily. 180 tablet 3  . metolazone (ZAROXOLYN) 2.5 MG tablet TAKE 1 TABLET ON Tuesday AND Thursday 30 MINUTES BEFORE TAKING TORSEMIDE OR AS DIRECTED 15 tablet 3  . Multiple Vitamin (MULTIVITAMIN WITH MINERALS) TABS Take 1  tablet by mouth daily.    . Multiple Vitamins-Minerals (ZINC PO) Take 1 tablet by mouth daily.    . nitroGLYCERIN (NITROSTAT) 0.4 MG SL tablet Place 1 tablet (0.4 mg total) under the tongue every 5 (five) minutes x 3 doses as needed for chest pain. 25 tablet 3  . omeprazole (PRILOSEC) 40 MG capsule Take 1 capsule (40 mg total) by mouth 2 (two) times daily before a meal. 60 capsule 3  . potassium chloride SA (KLOR-CON M20) 20 MEQ tablet Take 1 tablet (20 mEq total) by mouth daily. TAKE AN EXTRA TABLET ON Tuesday AND Thursday 40 tablet 3  . pravastatin (PRAVACHOL) 80 MG tablet TAKE 1 TABLET BY MOUTH DAILY AT BEDTIME 30 tablet 10  . sildenafil (REVATIO) 20 MG tablet Take 20-80 mg by mouth daily as needed (erectile dysfunction).    . torsemide (DEMADEX) 20 MG tablet Take 2 tablets ( 40 mg ) twice a day 180 tablet 3  . umeclidinium-vilanterol (ANORO ELLIPTA) 62.5-25 MCG/INH AEPB Inhale 1 puff into the lungs daily. 1 each 3  . VITAMIN A PO Take 1 tablet by mouth daily.    . Vitamin D, Ergocalciferol, (DRISDOL) 1.25 MG (50000 UT) CAPS capsule Take 50,000 Units by mouth every 7 (seven) days.     No current facility-administered medications on file prior to visit.    Allergies  Allergen Reactions  . Adhesive [Tape]     itching  . Latex Itching    When tape is on the skin too long skin gets red & itching    Family History  Problem Relation Age of Onset  . Diabetes Mother   . Heart disease Father   . Lung cancer Father   . Diabetes Maternal Grandmother   . Colon cancer Neg Hx   . Anesthesia problems Neg Hx   . Hypotension Neg Hx   . Malignant hyperthermia Neg  Hx   . Pseudochol deficiency Neg Hx   . Esophageal cancer Neg Hx   . Pancreatic cancer Neg Hx   . Stomach cancer Neg Hx     BP (!) 104/50   Pulse 79   Ht 6\' 3"  (1.905 m)   Wt 264 lb (119.7 kg)   SpO2 92%   BMI 33.00 kg/m    Review of Systems He denies hypoglycemia    Objective:   Physical Exam VITAL SIGNS:  See vs  page GENERAL: no distress Pulses: dorsalis pedis intact bilat.   MSK: no deformity of the feet CV: 1+ bilat leg edema Skin:  no ulcer on the feet, but the skin is dry.  normal temp on the feet.  Both legs and feet are erythematous. Neuro: sensation is intact to touch on the feet, but decreased from normal.   Ext: there is bilateral onychomycosis of the toenails.  Lab Results  Component Value Date   HGBA1C 8.1 (A) 10/08/2019       Assessment & Plan:  Insulin-requiring type 2 DM: The pattern of his cbg's indicates he needs some adjustment in his therapy    Patient Instructions  Please change the Levemir to the morning, and: continue the same Novolog.  Please continue the same metformin, Levemir, and Victoza.   check your blood sugar twice a day.  vary the time of day when you check, between before the 3 meals, and at bedtime.  also check if you have symptoms of your blood sugar being too high or too low.  please keep a record of the readings and bring it to your next appointment here (or you can bring the meter itself).  You can write it on any piece of paper.  please call us sooner if your blood sugar goes below 70, or if you have a lot of readings over 200. Please come back for a follow-up appointment in 6 weeks.

## 2019-10-08 NOTE — Patient Instructions (Addendum)
Please change the Levemir to the morning, and: continue the same Novolog.  Please continue the same metformin, Levemir, and Victoza.   check your blood sugar twice a day.  vary the time of day when you check, between before the 3 meals, and at bedtime.  also check if you have symptoms of your blood sugar being too high or too low.  please keep a record of the readings and bring it to your next appointment here (or you can bring the meter itself).  You can write it on any piece of paper.  please call us sooner if your blood sugar goes below 70, or if you have a lot of readings over 200. Please come back for a follow-up appointment in 6 weeks.

## 2019-10-08 NOTE — Progress Notes (Signed)
Subjective:    Patient ID: [name], male    DOB: [birth year], 64 y.o.   MRN: [id number]  HPI Pt returns for f/u of diabetes mellitus:  DM type: Insulin-requiring type 2 Dx'ed: 0000000 Complications: none Therapy: insulin since 2019, metformin, and Victoza.  DKA: never Severe hypoglycemia: never Pancreatitis: never Pancreatic imaging: normal on 2017 CT SDOH: none Other: he takes multiple daily injections; he did not tolerate Jardiance (dehydration); metformin dosage is limited by abd pain.    Interval history: Pt says he seldom misses the insulin.  He says he would remember the Levemir better if could take at supper.  no cbg record, but states cbg's vary from 118-350.  It is in general lowest in the afternoon.  it is in general highest fasting (higher than at HS, even if no HS-snack).   Main symptom is fatigue.   Past Medical History:  Diagnosis Date  . Arthritis    lower back  . Asthma   . Atrial flutter ([place])    s/p CTI ablation by Dr Rayann Heman  . Brain aneurysm 2009   questionable. A follow up CTA in 2009 showed no evidence of  . Chronic back pain    DDD/stenosis  . Colon polyps    9 polyps removed 10/13/11  . Complication of anesthesia September 11, 2012   slow to awaken after ablation  . COPD (chronic obstructive pulmonary disease) ([place])   . Diabetes mellitus    takes Metformin and Glimepiride daily  . Emphysema   . GERD (gastroesophageal reflux disease)    takes Omeprazole bid  . History of shingles   . HLD (hyperlipidemia)    takes Pravastatin daily  . HTN (hypertension)    takes Prinizide daily  . Obesity   . OSA (obstructive sleep apnea)    not always using cpap  . Overdose 2009   unintentional Flecanide overdose  . Peripheral neuropathy   . Short-term memory loss   . Tobacco abuse     Past Surgical History:  Procedure Laterality Date  . ANTERIOR CERVICAL DECOMP/DISCECTOMY FUSION  01/04/2012   Procedure: ANTERIOR CERVICAL DECOMPRESSION/DISCECTOMY FUSION 1  LEVEL/HARDWARE REMOVAL;  Surgeon: Ophelia Charter, MD;  Location: [place] NEURO ORS;  Service: Neurosurgery;  Laterality: N/A;  explore cervical fusion Cervical six - seven  with removal of codman plate anterior cervical decompression with fusion interbody prothesis plating and bonegraft  . APPENDECTOMY  2-91yrs ago  . ATRIAL FIBRILLATION ABLATION N/A 09/11/2012   PT DID NOT HAVE AN ATRIAL FIBRILLATION ABLATION IN 2014!  ATRIAL FLUTTER ABLATION ONLY  . ATRIAL FLUTTER ABLATION  09/11/2012   CTI ablation by Dr Rayann Heman  . CARDIAC CATHETERIZATION  2008   no significant CAD  . CARDIOVERSION  05/05/2011   Procedure: CARDIOVERSION;  Surgeon: Lelon Perla, MD;  Location: [facility] ENDOSCOPY;  Service: Cardiovascular;  Laterality: N/A;  . CARDIOVERSION Bilateral 07/26/2012   Procedure: CARDIOVERSION;  Surgeon: Minus Breeding, MD;  Location: [facility] ENDOSCOPY;  Service: Cardiovascular;  Laterality: Bilateral;  . CARPAL TUNNEL RELEASE  99/2000   bilateral  . COLONOSCOPY WITH PROPOFOL N/A 12/13/2012   Procedure: COLONOSCOPY WITH PROPOFOL;  Surgeon: Milus Banister, MD;  Location: [facility] ENDOSCOPY;  Service: Endoscopy;  Laterality: N/A;  . ELECTROPHYSIOLOGIC STUDY N/A 04/21/2015   Procedure: Atrial Fibrillation Ablation;  Surgeon: Thompson Grayer, MD;  Location: [place] CV LAB;  Service: Cardiovascular;  Laterality: N/A;  . HERNIA REPAIR    . KNEE SURGERY  6-48yrs ago   left  .  LEFT HEART CATH AND CORONARY ANGIOGRAPHY N/A 09/19/2016   Procedure: Left Heart Cath and Coronary Angiography;  Surgeon: Belva Crome, MD;  Location: [place] CV LAB;  Service: Cardiovascular;  Laterality: N/A;  . Left inguinal hernia repair     as a child  . NASAL SEPTOPLASTY W/ TURBINOPLASTY Bilateral 02/19/2014   Procedure: NASAL SEPTOPLASTY WITH BILATERAL TURBINATE REDUCTION;  Surgeon: Jodi Marble, MD;  Location: [place];  Service: ENT;  Laterality: Bilateral;  . NECK SURGERY  30yrs ago  . right shoulder surgery  4-62yrs ago   cyst removed  .  TEE WITHOUT CARDIOVERSION  05/05/2011   Procedure: TRANSESOPHAGEAL ECHOCARDIOGRAM (TEE);  Surgeon: Lelon Perla, MD;  Location: [facility] ENDOSCOPY;  Service: Cardiovascular;  Laterality: N/A;  . TEE WITHOUT CARDIOVERSION N/A 04/21/2015   Procedure: TRANSESOPHAGEAL ECHOCARDIOGRAM (TEE);  Surgeon: Sueanne Margarita, MD;  Location: [facility] ENDOSCOPY;  Service: Cardiovascular;  Laterality: N/A;  . THROAT SURGERY  4-21yrs ago   "thought " it was cancer but came back not  . TONSILLECTOMY      Social History   Socioeconomic History  . Marital status: Married    Spouse name: Not on file  . Number of children: 2  . Years of education: Not on file  . Highest education level: Not on file  Occupational History  . Occupation: Dealer   Tobacco Use  . Smoking status: Current Every Day Smoker    Packs/day: 1.00    Years: 49.00    Pack years: 49.00    Types: Cigarettes  . Smokeless tobacco: Never Used  . Tobacco comment: He is going to try 1-800-QUIT Now for nicotine replacement therapy  Substance and Sexual Activity  . Alcohol use: Not Currently    Alcohol/week: 0.0 standard drinks    Comment: 12/05/17 pt stated he has drinked 1/2 drink in 3 years  . Drug use: No  . Sexual activity: Yes  Other Topics Concern  . Not on file  Social History Narrative   Daily caffeine(Mountain Dew)       Lives in [place] with spouse.   Unemployed due to chronic back/ leg pain         Social Determinants of Health   Financial Resource Strain:   . Difficulty of Paying Living Expenses:   Food Insecurity:   . Worried About Charity fundraiser in the Last Year:   . Arboriculturist in the Last Year:   Transportation Needs:   . Film/video editor (Medical):   [name] Lack of Transportation (Non-Medical):   Physical Activity:   . Days of Exercise per Week:   . Minutes of Exercise per Session:   Stress:   . Feeling of Stress :   Social Connections:   . Frequency of Communication with Friends and Family:     . Frequency of Social Gatherings with Friends and Family:   . Attends Religious Services:   . Active Member of Clubs or Organizations:   . Attends Archivist Meetings:   [name] Marital Status:   Intimate Partner Violence:   . Fear of Current or Ex-Partner:   . Emotionally Abused:   [name] Physically Abused:   . Sexually Abused:     Current Outpatient Medications on File Prior to Visit  Medication Sig Dispense Refill  . albuterol (PROAIR HFA) 108 (90 Base) MCG/ACT inhaler Inhale 1-2 puffs into the lungs every 6 (six) hours as needed for wheezing or shortness of breath. 1 Inhaler 5  .  Ascorbic Acid (VITAMIN C PO) Take 1 tablet by mouth daily.    [name] b complex vitamins tablet Take 1 tablet by mouth daily.    . B-D UF III MINI PEN NEEDLES 31G X 5 MM MISC     . bisoprolol (ZEBETA) 10 MG tablet Take 10 mg by mouth daily.    [name] diltiazem (CARDIZEM CD) 120 MG 24 hr capsule TAKE 1 CAPSULE BY MOUTH DAILY 30 capsule 4  . gabapentin (NEURONTIN) 100 MG capsule Take 100 mg by mouth as directed. Take 1 tablet in the am and 2 tablets in the evening    . HYDROcodone-acetaminophen (NORCO) 7.5-325 MG tablet Take 1 tablet by mouth every 6 (six) hours as needed for moderate pain.    . Insulin Aspart FlexPen 100 UNIT/ML SOPN INJECT 60 UNITS INTO THE SKIN 3 TIMES DAILY WITH MEALS 54 mL 0  . Insulin Pen Needle (PEN NEEDLES) 30G X 8 MM MISC 1 each by Does not apply route 5 (five) times daily. E11.9 200 each 1  . liraglutide (VICTOZA) 18 MG/3ML SOPN Inject 0.3 mLs (1.8 mg total) into the skin daily. 9 mL 11  . losartan (COZAAR) 50 MG tablet Take 1 tablet (50 mg total) by mouth daily. 90 tablet 3  . metFORMIN (GLUCOPHAGE-XR) 500 MG 24 hr tablet Take 1 tablet (500 mg total) by mouth 2 (two) times daily. 180 tablet 3  . metolazone (ZAROXOLYN) 2.5 MG tablet TAKE 1 TABLET ON Tuesday AND Thursday 30 MINUTES BEFORE TAKING TORSEMIDE OR AS DIRECTED 15 tablet 3  . Multiple Vitamin (MULTIVITAMIN WITH MINERALS) TABS Take 1  tablet by mouth daily.    . Multiple Vitamins-Minerals (ZINC PO) Take 1 tablet by mouth daily.    . nitroGLYCERIN (NITROSTAT) 0.4 MG SL tablet Place 1 tablet (0.4 mg total) under the tongue every 5 (five) minutes x 3 doses as needed for chest pain. 25 tablet 3  . omeprazole (PRILOSEC) 40 MG capsule Take 1 capsule (40 mg total) by mouth 2 (two) times daily before a meal. 60 capsule 3  . potassium chloride SA (KLOR-CON M20) 20 MEQ tablet Take 1 tablet (20 mEq total) by mouth daily. TAKE AN EXTRA TABLET ON Tuesday AND Thursday 40 tablet 3  . pravastatin (PRAVACHOL) 80 MG tablet TAKE 1 TABLET BY MOUTH DAILY AT BEDTIME 30 tablet 10  . sildenafil (REVATIO) 20 MG tablet Take 20-80 mg by mouth daily as needed (erectile dysfunction).    . torsemide (DEMADEX) 20 MG tablet Take 2 tablets ( 40 mg ) twice a day 180 tablet 3  . umeclidinium-vilanterol (ANORO ELLIPTA) 62.5-25 MCG/INH AEPB Inhale 1 puff into the lungs daily. 1 each 3  . VITAMIN A PO Take 1 tablet by mouth daily.    . Vitamin D, Ergocalciferol, (DRISDOL) 1.25 MG (50000 UT) CAPS capsule Take 50,000 Units by mouth every 7 (seven) days.     No current facility-administered medications on file prior to visit.    Allergies  Allergen Reactions  . Adhesive [Tape]     itching  . Latex Itching    When tape is on the skin too long skin gets red & itching    Family History  Problem Relation Age of Onset  . Diabetes Mother   . Heart disease Father   . Lung cancer Father   . Diabetes Maternal Grandmother   . Colon cancer Neg Hx   . Anesthesia problems Neg Hx   . Hypotension Neg Hx   . Malignant hyperthermia Neg  Hx   . Pseudochol deficiency Neg Hx   . Esophageal cancer Neg Hx   . Pancreatic cancer Neg Hx   . Stomach cancer Neg Hx     BP (!) 104/50   Pulse 79   Ht 6\' 3"  (1.905 m)   Wt 264 lb (119.7 kg)   SpO2 92%   BMI 33.00 kg/m    Review of Systems He denies hypoglycemia    Objective:   Physical Exam VITAL SIGNS:  See vs  page GENERAL: no distress Pulses: dorsalis pedis intact bilat.   MSK: no deformity of the feet CV: 1+ bilat leg edema Skin:  no ulcer on the feet, but the skin is dry.  normal temp on the feet.  Both legs and feet are erythematous. Neuro: sensation is intact to touch on the feet, but decreased from normal.   Ext: there is bilateral onychomycosis of the toenails.  Lab Results  Component Value Date   HGBA1C 8.1 (A) 10/08/2019       Assessment & Plan:  Insulin-requiring type 2 DM: The pattern of his cbg's indicates he needs some adjustment in his therapy    Patient Instructions  Please change the Levemir to the morning, and: continue the same Novolog.  Please continue the same metformin, Levemir, and Victoza.   check your blood sugar twice a day.  vary the time of day when you check, between before the 3 meals, and at bedtime.  also check if you have symptoms of your blood sugar being too high or too low.  please keep a record of the readings and bring it to your next appointment here (or you can bring the meter itself).  You can write it on any piece of paper.  please call us sooner if your blood sugar goes below 70, or if you have a lot of readings over 200. Please come back for a follow-up appointment in 6 weeks.

## 2019-10-09 ENCOUNTER — Telehealth: Payer: Self-pay | Admitting: Student

## 2019-10-09 ENCOUNTER — Telehealth: Payer: Self-pay | Admitting: Endocrinology

## 2019-10-09 ENCOUNTER — Telehealth: Payer: Self-pay | Admitting: Pharmacist

## 2019-10-09 NOTE — Telephone Encounter (Signed)
[name], Callie E, PA-C  P Cv Div Nl Scheduling   Cc: P Cv Div Nl Anticoag; Caprice Beaver, LPN  Per GI note on [id number], felt to be safe to resume blood thinner. Can we please get patient an appointment in the [facility]madin [facility] as soon as possible so that we can restart this?   Thank you!

## 2019-10-09 NOTE — Telephone Encounter (Signed)
Please advise 

## 2019-10-09 NOTE — Telephone Encounter (Signed)
As you are changing the levemir to the morning, please give it a few days.

## 2019-10-09 NOTE — Telephone Encounter (Signed)
Left message for pt.  Will advise him to resume warfarin 5mg  tablet at 1.5 tablets daily except 1 tablet on Mondays, Wednesdays, and Fridays. Will schedule INR 1 week after resuming warfarin.

## 2019-10-09 NOTE — Telephone Encounter (Signed)
Patient is scheduled for 5/28 to restart coumadin.

## 2019-10-09 NOTE — Telephone Encounter (Signed)
Please take insulin as rx'ed, and Please call or message [place] next week, to tell us how the blood sugar is doing.  Please give it time

## 2019-10-09 NOTE — Telephone Encounter (Signed)
Please advise how you wish to proceed

## 2019-10-09 NOTE — Telephone Encounter (Signed)
We are doing the best I can.

## 2019-10-09 NOTE — Telephone Encounter (Signed)
Spoke with patient to advise him of what Dr. Loanne Drilling said regarding taking Levemir with dinner.  Patient states last night he took 90 units at dinner.  This morning  When he got up his CBG was 353. Patient then gave himself another dose of Levemir 90 units.  Lunch today his CBG 270 before eating.  Patient concerned this insulin just does not work for him.  Please advise.

## 2019-10-09 NOTE — Telephone Encounter (Signed)
Spoke to patient and read message from Dr. Loanne Drilling.  Patient states his sugars are still high.  Patient then states " ok we will keep my blood sugar high so everybody is happy" patient then hung up.  FYI to Dr. Loanne Drilling.

## 2019-10-09 NOTE — Telephone Encounter (Signed)
We discussed that he should take the levemir later in the day.  He said it was hard to remember at [employer], so I told him it is fine to take with supper.

## 2019-10-09 NOTE — Telephone Encounter (Signed)
Patient called to advised that his blood sugar this morning 347. Would like a call back regarding what to do?  Call back number [phone]

## 2019-10-09 NOTE — Telephone Encounter (Signed)
Called pt and informed him about Dr. Cordelia Pen response below. Pt stated he was never told to start taking Levemir in the morning. Reminded pt that these instructions are also documented of his AVS from yesterday. Pt proceeded to raise his voice and state, "that was not what I was told", further adding, "this is not the first time I have been told one thing and papers say another". Pt admitted he never read his AVS, then stated "all we keep doin' is flip floppin' these meds around and nothin' is workin'". Pt then stated, "ya know what....nevermind", then hung up on me. Routing back to Dr. Loanne Drilling for him to review and to advise.

## 2019-10-09 NOTE — Telephone Encounter (Signed)
Patient requests to be called at ph# [phone] re: Levemir. Patient states he was verbally told by Dr. Loanne Drilling at his appointment on  10/08/19 to take Levemire at night. Patient states the Nurse told him he supposed to take Levemir in the morning (also states on AVS same). Patient is not sure if he is supposed to take Levemir once per day or twice per day. Patient states his blood sugars are not improving and he is very weak.

## 2019-10-09 NOTE — Telephone Encounter (Signed)
-----   Message from Darreld Mclean, [place] sent at 10/08/2019  7:44 PM EDT ----- Per GI note on 09/2019, felt to be safe to resume blood thinner. Can we please get patient an appointment in the [facility]madin [facility] as soon as possible so that we can restart this?  Thank you! ----- Message ----- From: Caprice Beaver, LPN Sent: [id number]   5:14 PM EDT To: Darreld Mclean, PA-C  Can you please look at if that patient should have restarted his blood thinner?   When I called him to talk with him- he states that he has not restarted it at this time and I wanted to check.  Thank you!

## 2019-10-09 NOTE — Telephone Encounter (Signed)
Spoke with pt who states the GI doctor told him he was bleeding internally and he is concerned about restarting his warfarin.   I do see where GI note from Dr Oretha Caprice (addendum to note) on 10/03/19 states "His Hb drop was quite minor.  I think it is safe for him to resume his blood thinners in the interim and hold per protocol prior to hospital EGD."  Pt was overall confused and had many questions that I am unable to answer. Will route to PA who forwarded message on resuming warfarin to follow up with patient.

## 2019-10-09 NOTE — Progress Notes (Signed)
    Subjective: Patient is a 64 y.o. male presenting to the office today as a new patient with a chief complaint of painful callus lesion(s) noted to the bilateral feet that have been present for the past several years. Walking and bearing weight increases the pain. He has been trimming the calluses himself and soaking the feet for treatment.  Patient also complains of elongated, thickened nails that cause pain while ambulating in shoes. He is unable to trim his own nails. Patient presents today for further treatment and evaluation.  Past Medical History:  Diagnosis Date  . Arthritis    lower back  . Asthma   . Atrial flutter ([place])    s/p CTI ablation by Dr Rayann Heman  . Brain aneurysm 2009   questionable. A follow up CTA in 2009 showed no evidence of  . Chronic back pain    DDD/stenosis  . Colon polyps    9 polyps removed 10/13/11  . Complication of anesthesia September 11, 2012   slow to awaken after ablation  . COPD (chronic obstructive pulmonary disease) ([place])   . Diabetes mellitus    takes Metformin and Glimepiride daily  . Emphysema   . GERD (gastroesophageal reflux disease)    takes Omeprazole bid  . History of shingles   . HLD (hyperlipidemia)    takes Pravastatin daily  . HTN (hypertension)    takes Prinizide daily  . Obesity   . OSA (obstructive sleep apnea)    not always using cpap  . Overdose 2009   unintentional Flecanide overdose  . Peripheral neuropathy   . Short-term memory loss   . Tobacco abuse     Objective:  Physical Exam General: Alert and oriented x3 in no acute distress  Dermatology: Hyperkeratotic lesion(s) present on the bilateral feet. Pain on palpation with a central nucleated core noted. Skin is warm, dry and supple bilateral lower extremities. Negative for open lesions or macerations. Nails are tender, long, thickened and dystrophic with subungual debris, consistent with onychomycosis, 1-5 bilateral. No signs of infection noted.  Vascular: Palpable  pedal pulses bilaterally. No edema or erythema noted. Capillary refill within normal limits.  Neurological: Epicritic and protective threshold diminished bilaterally.   Musculoskeletal Exam: Pain on palpation at the keratotic lesion(s) noted. Range of motion within normal limits bilateral. Muscle strength 5/5 in all groups bilateral.  Assessment: 1. Onychodystrophic nails 1-5 bilateral with hyperkeratosis of nails.  2. Onychomycosis of nail due to dermatophyte bilateral 3. Pre-ulcerative callus lesions noted to the bilateral feet x 2   Plan of Care:  1. Patient evaluated. 2. Excisional debridement of keratoic lesion(s) using a chisel blade was performed without incident.  3. Dressed with light dressing. 4. Mechanical debridement of nails 1-5 bilaterally performed using a nail nipper. Filed with dremel without incident.  5. Recommended Urea 40% lotion daily.  6. Patient is to return to the clinic in 3 months for routine care.   Edrick Kins, DPM [facility]  Dr. Edrick Kins, [address]                                        [place], [place] [address]                Office [phone]  Fax [phone]

## 2019-10-10 ENCOUNTER — Ambulatory Visit (HOSPITAL_COMMUNITY)
Admission: RE | Admit: 2019-10-10 | Discharge: 2019-10-10 | Disposition: A | Discharge status: Home / Self Care | Payer: 59 | Source: Ambulatory Visit | Attending: Gastroenterology | Admitting: Gastroenterology

## 2019-10-10 ENCOUNTER — Other Ambulatory Visit: Payer: Self-pay

## 2019-10-10 DIAGNOSIS — K7460 Unspecified cirrhosis of liver: Secondary | ICD-10-CM | POA: Diagnosis present

## 2019-10-10 NOTE — Telephone Encounter (Signed)
Talked to patient today. Instructions to resume warfarin 5mg  daily give. Patient accept recommendation and will come to repeat INR on 10/18/2019 at 10:30am.

## 2019-10-10 NOTE — Telephone Encounter (Signed)
Pt returned call to callie but raquel stated that she will call the pt back I will route to her

## 2019-10-10 NOTE — Telephone Encounter (Signed)
Called and left patient a message. I am in the hospital today so I may not be able to speak to him right away when he calls back but asked patient to leave a message with triage with his questions. Thank you!

## 2019-10-10 NOTE — Telephone Encounter (Signed)
Pt called askig about warfarin will route to raquel to return the call as there was a note stating callie wants him to restart warfarin.

## 2019-10-11 NOTE — Telephone Encounter (Addendum)
Left message for patient to call the office. Need to confirm time and date of new procedure and remind patient to let us know if they restart him on Coumadin. Instructions in chart to be printed and mailed once confirmed with patient.

## 2019-10-11 NOTE — Telephone Encounter (Addendum)
Spoke with patient and informed him of time and date of procedure. Patient states he restarts his Coumadin today. Will send clearance request to cardiology and patient will speak with his doctor next Friday at his appointment. Instructions mailed to patient home.

## 2019-10-15 ENCOUNTER — Telehealth: Payer: Self-pay | Admitting: *Deleted

## 2019-10-15 NOTE — Telephone Encounter (Addendum)
[facility] Pre-operative Risk Assessment     Request for surgical clearance:     Endoscopy Procedure  What type of surgery is being performed?     EGD  When is this surgery scheduled?     Thursday 11/07/19  What type of clearance is required ?   Pharmacy  Are there any medications that need to be held prior to surgery and how long? Warfarin 5 days  Practice name and name of physician performing surgery?   Owens Loffler, MD   [facility]  What is your office phone and fax number?      Phone- [phone]  [phone]  Anesthesia type (None, local, MAC, general) ?       MAC

## 2019-10-15 NOTE — Telephone Encounter (Signed)
Patient with diagnosis of afib on warfarin for anticoagulation.  Had recently been holding warfarin for 2 weeks due to possible GI bleed  Procedure: EGD Date of procedure: 11/07/19  CHADS2-VASc score of  4 (CHF, HTN, DM, CAD).   CrCl 49mL/min using adjusted body weight Platelet count 143K  Per office protocol, patient can hold warfarin for 5 days prior to procedure.

## 2019-10-15 NOTE — Telephone Encounter (Signed)
Clinical pharmacist to review Coumadin ?

## 2019-10-17 ENCOUNTER — Telehealth: Payer: Self-pay | Admitting: Cardiology

## 2019-10-17 ENCOUNTER — Telehealth: Payer: Self-pay | Admitting: Endocrinology

## 2019-10-17 NOTE — Telephone Encounter (Signed)
Pt c/o Shortness Of Breath: STAT if SOB developed within the last 24 hours or pt is noticeably SOB on the phone  1. Are you currently SOB (can you hear that pt is SOB on the phone)? No   2. How long have you been experiencing SOB? Occurred at 1:30 AM to 3:00 AM this morning   3. Are you SOB when sitting or when up moving around? Both   4. Are you currently experiencing any other symptoms? Sweating, throwing up fluid, and fatigue   Ayrton is calling stating he got sick this morning and vomitted fluids. He states around 1:30 AM this morning he woke up sweating with SOB. This lasted 15 minutes prior to him throwing up an excessive amount of fluid. He states after vomiting the symptoms subsided, but he has been experiencing fatigue since. [place] states he didn't know if this was heart related or not, but wanted to make Dr. Percival Spanish aware. Please advise.

## 2019-10-17 NOTE — Telephone Encounter (Signed)
Patient called saying he's been having high sugar readings and feeling sick after starting taking Levemir and requests to talk to nurse about it. Ph# [phone]

## 2019-10-17 NOTE — Telephone Encounter (Signed)
Yes, Victoza is the one to d/c, as it can cause nausea

## 2019-10-17 NOTE — Telephone Encounter (Signed)
Just to verify are you talking about Levemir or Victoza?   Patient wants to stop the Levemir?

## 2019-10-17 NOTE — Telephone Encounter (Signed)
I agree.  We will be keeping close tabs on his blood counts until his procedure.  I believe that he reported his was to have labs again at the end of this week, likely with his cardiologist appointment tomorrow.  Likely multiple things contributing to his fatigue.  He can rediscuss the issue with the warfarin with them, but he is probably too high risk to remain off of it until his procedure at the end of the month so certainly an option to monitor hemoglobin closely until then if he remains on it.  Thank you,  Jess

## 2019-10-17 NOTE — Telephone Encounter (Signed)
Patient states that he is feeling sick nausea, vomiting, SOB, Hot, states he does not have a way to check fever, fatigued terrible, and dry all the time.States that he thinks it is the [place].   Stopped Levamir for 1 week and his sugar was better. started back 2 days ago and his levamir.   Patient states that he is allergic to [place].  Sugar over 200 every time he checks it.  If he takes his novalog and waits to eat then his sugars are around 100 and goes up after he eats.   Patient is very frustrated.   He wants to find out why his sugar is still running high for almost a year.   Patient -wants to see about changing his medications.  Please review and advise patient.   He can be reached at  [phone].

## 2019-10-17 NOTE — Telephone Encounter (Signed)
Left a message to return call.  

## 2019-10-17 NOTE — Telephone Encounter (Signed)
This is prob not related to Victoza, as you have been on this for a while.  However, for now, d/c it, to help you feel better. Please call back if cbg is over 400, ot less than 70.  This is all I can offer from the diabetes standpoint.

## 2019-10-17 NOTE — Telephone Encounter (Signed)
Pt has been notified and aware. He states understanding. And has no further question on holding his warfin.  However he doesn't understand that he has to continue the warfin now, when he has been vomiting blood as he did last night. He also complains of fatigue with this being his biggest complaint. He feels like no one is concerned with his symptoms. He feels he the only one worried about his blood loss, and why he has to continue the blood thinner.  I explained that with all the different variables of his current diagnosis and problems that its hard to say what is directly causing his fatigue as he admits his sugars are running high, and he has cirrhosis that also can contribute to fatigue. Patient felt better after talking about all this, advised he get his sugars in better control to help with feeling better.

## 2019-10-17 NOTE — Telephone Encounter (Signed)
Called patient- he states that he woke up this morning and has been throwing up fluids- and he feels sweaty, and very tired with no energy.  He states that he was put back on his insulin 2 days ago and his sugars were better off of it than it has been on it. Patient denies any cardiac symptoms but feels that he has nobody else to try and fix what is wrong with him. When asked what his blood sugar was- he states that when he checked it earlier (he did not give a time) it was 270, but he advised this was a normal high for him- and it is always this way. After speaking with him, I advised him to call his Endocrinologist- or go to urgent care to get assistance.  He states he did not want to contact his Endo office and he would just see if it got better.    Will copy the Endocrinologist to this message- as well as Dr.Hochrein to make him aware.   Thanks!

## 2019-10-18 ENCOUNTER — Other Ambulatory Visit: Payer: Self-pay

## 2019-10-18 ENCOUNTER — Ambulatory Visit (INDEPENDENT_AMBULATORY_CARE_PROVIDER_SITE_OTHER): Payer: 59 | Admitting: Pharmacist Clinician (PhC)/ Clinical Pharmacy Specialist

## 2019-10-18 ENCOUNTER — Telehealth: Payer: Self-pay | Admitting: Cardiology

## 2019-10-18 DIAGNOSIS — Z7901 Long term (current) use of anticoagulants: Secondary | ICD-10-CM

## 2019-10-18 DIAGNOSIS — I4892 Unspecified atrial flutter: Secondary | ICD-10-CM | POA: Diagnosis not present

## 2019-10-18 DIAGNOSIS — I480 Paroxysmal atrial fibrillation: Secondary | ICD-10-CM | POA: Diagnosis not present

## 2019-10-18 DIAGNOSIS — Z79899 Other long term (current) drug therapy: Secondary | ICD-10-CM

## 2019-10-18 LAB — POCT INR: INR: 2.1 (ref 2.0–3.0)

## 2019-10-18 LAB — BASIC METABOLIC PANEL
BUN/Creatinine Ratio: 21 (ref 10–24)
BUN: 26 mg/dL (ref 8–27)
CO2: 24 mmol/L (ref 20–29)
Calcium: 8.7 mg/dL (ref 8.6–10.2)
Chloride: 99 mmol/L (ref 96–106)
Creatinine, Ser: 1.21 mg/dL (ref 0.76–1.27)
GFR calc Af Amer: 74 mL/min/{1.73_m2} (ref >59)
GFR calc non Af Amer: 64 mL/min/{1.73_m2} (ref >59)
Glucose: 258 mg/dL — ABNORMAL HIGH (ref 65–99)
Potassium: 3.5 mmol/L (ref 3.5–5.2)
Sodium: 142 mmol/L (ref 134–144)

## 2019-10-18 NOTE — Telephone Encounter (Addendum)
Talked to patient, he states he did have a lab drawn today, and is waiting to see Cardiology now.

## 2019-10-18 NOTE — Telephone Encounter (Signed)
New Message   Pt is calling and says Dr Ardis Hughs is wanting him to have the Hemoglobin lab as well    Please advise

## 2019-10-18 NOTE — Telephone Encounter (Signed)
Left message to return call 

## 2019-10-18 NOTE — Telephone Encounter (Signed)
Patient notified of recommendations.  States that he understands.  He also states that he is bleeding from both ends now.  I advised him to go to ED.   He states that he has appointment with GI at the end of June.    I told him to call them and see if he could be seen sooner.   Patient advised that if he gets to feeling worse to go directly to ED.  Patient states that he understands.

## 2019-10-18 NOTE — Telephone Encounter (Signed)
Spoke with patient. He reports he had a voicemail from dr. Eugenia Pancoast office requesting he has his CBC checked as well while he was having labs drawn today. Order placed for CBC. Will send to Dr. Percival Spanish so he is aware labs have been ordered.   Patient will have endoscopy procedure at the end of June.

## 2019-10-19 LAB — CBC
Hematocrit: 34.5 % — ABNORMAL LOW (ref 37.5–51.0)
Hemoglobin: 11.7 g/dL — ABNORMAL LOW (ref 13.0–17.7)
MCH: 30.3 pg (ref 26.6–33.0)
MCHC: 33.9 g/dL (ref 31.5–35.7)
MCV: 89 fL (ref 79–97)
Platelets: 134 10*3/uL — ABNORMAL LOW (ref 150–450)
RBC: 3.86 x10E6/uL — ABNORMAL LOW (ref 4.14–5.80)
RDW: 15.8 % — ABNORMAL HIGH (ref 11.6–15.4)
WBC: 5.9 10*3/uL (ref 3.4–10.8)

## 2019-10-28 ENCOUNTER — Ambulatory Visit (INDEPENDENT_AMBULATORY_CARE_PROVIDER_SITE_OTHER): Payer: 59 | Admitting: Pharmacist

## 2019-10-28 ENCOUNTER — Other Ambulatory Visit: Payer: Self-pay | Admitting: Endocrinology

## 2019-10-28 ENCOUNTER — Other Ambulatory Visit: Payer: Self-pay

## 2019-10-28 DIAGNOSIS — E119 Type 2 diabetes mellitus without complications: Secondary | ICD-10-CM

## 2019-10-28 DIAGNOSIS — I4892 Unspecified atrial flutter: Secondary | ICD-10-CM | POA: Diagnosis not present

## 2019-10-28 DIAGNOSIS — Z7901 Long term (current) use of anticoagulants: Secondary | ICD-10-CM

## 2019-10-28 DIAGNOSIS — I480 Paroxysmal atrial fibrillation: Secondary | ICD-10-CM | POA: Diagnosis not present

## 2019-10-28 LAB — POCT INR: INR: 3.3 — AB (ref 2.0–3.0)

## 2019-10-28 NOTE — Patient Instructions (Signed)
Take 5mg  daily except for 1/2 tablet every Monday. Repeat INR in 4 weeks (2 weeks after EGD). See calendar for instructions in preparation to endoscopy on 11/07/2019

## 2019-10-28 NOTE — Telephone Encounter (Signed)
Patient is scheduled for a follow up with Cardiology on 11-08-2019 and with Endocrinology on  11-21-2019

## 2019-10-28 NOTE — Telephone Encounter (Signed)
Last OV 10/08/19 Last fill 09/25/19  #68ml/0

## 2019-10-30 NOTE — Addendum Note (Signed)
Addended by: Horris Latino on: 10/30/2019 11:58 AM   Modules accepted: Orders

## 2019-11-04 ENCOUNTER — Other Ambulatory Visit: Payer: Self-pay | Admitting: Gastroenterology

## 2019-11-04 ENCOUNTER — Inpatient Hospital Stay (HOSPITAL_COMMUNITY): Admission: RE | Admit: 2019-11-04 | Payer: 59 | Source: Ambulatory Visit

## 2019-11-05 LAB — SARS CORONAVIRUS 2 (TAT 6-24 HRS): SARS Coronavirus 2: NEGATIVE

## 2019-11-05 NOTE — Progress Notes (Signed)
Reviewed preprocedure instructions with patient regarding Insulin dosing prior to procedure.  Patient states his glucose " has been running high" " like the other nite it was 400" patient states he will adjusts his Levemir and Aspart Insulin according to what glucose is running.  Patient states he will call Dr Ardis Hughs office in am because pt thinks MD needs to do a colonoscopy as well as EGD.  Patient to also check with MD regarding NPO status.  Patient states he does not have instructions regardin npo status after midnite on 11/06/19.  Patient to check with MD office in am regarding NPO status.  I told him I believed he would be NPO after midnite.for procedure on 11/07/19.

## 2019-11-06 NOTE — Progress Notes (Signed)
Paged Dr Bryan Lemma with [facility] who is on call for Dr Ardis Hughs .  Dr Bryan Lemma returned page and informed of progress note info dated 11/06/2019 at 527pm.  No further orders given.   Patient called and informed that I had spoken with Dr Bryan Lemma in regards to  [facility] documented in progress note of 11/06/19 at 527pm.  Wife reports on phone that patient had just fallen again.  Patient reports " I am fine"." I am not hurt" .    I have a history of a bad back and sciatic nerve problems and my legs just buckle sometimes" Instructed patient to seek medical attention .  Patient voiced understanding.

## 2019-11-06 NOTE — Progress Notes (Signed)
Checked on patient this pm since he stated on 11/05/19 that he was going to talk with Dr Ardis Hughs today about possibility of doing a colonoscopy as well as EGD.  Patient stated he saw PCP, DR Nile Riggs today for visit. Patient stated that Dr Nile Riggs instructed him to follow thru with EGD on 11/07/19 then go from there.  Patient states she told him he had an ear infection and pt reports that he will start on antibiotics after procedure on 11/07/19.  Patient reports he has fallen a little more than usual and that PCP , Nile Riggs is aware of this at appt of 11/06/19. Instructed patient that in am when he comes for procedure for his wife to obtain wheelchair at main entrance of hospital and take it to car and use wheelchair to get into hospital.  Patient states he has not gotten hurt with the falls. Patient voiced understanding of wheelchair and that his wife would get one for him to get into at the car at main entrance.  Reviewed preprocedure Insulin dosing with patient and patient stated that his glucose was 350s this pm.  Patient stated he took his Aspart Insulin this pm and would take his Levemir at supper and would only take 1/2 the dose or around 1/2 the dose depending on what his glucose is running.  Patient aware to take no Insulin in the am and no diabetic meds in the am .

## 2019-11-07 ENCOUNTER — Ambulatory Visit (HOSPITAL_COMMUNITY)
Admission: RE | Admit: 2019-11-07 | Discharge: 2019-11-07 | Disposition: A | Discharge status: Home / Self Care | Payer: 59 | Attending: Gastroenterology | Admitting: Gastroenterology

## 2019-11-07 ENCOUNTER — Encounter (HOSPITAL_COMMUNITY)
Admission: RE | Disposition: A | Discharge status: Home / Self Care | Payer: Self-pay | Source: Home / Self Care | Attending: Gastroenterology

## 2019-11-07 ENCOUNTER — Encounter (HOSPITAL_COMMUNITY): Payer: Self-pay | Admitting: Gastroenterology

## 2019-11-07 ENCOUNTER — Ambulatory Visit: Payer: 59 | Admitting: Endocrinology

## 2019-11-07 ENCOUNTER — Other Ambulatory Visit: Payer: Self-pay

## 2019-11-07 ENCOUNTER — Ambulatory Visit (HOSPITAL_COMMUNITY): Payer: 59 | Admitting: Anesthesiology

## 2019-11-07 ENCOUNTER — Telehealth: Payer: Self-pay

## 2019-11-07 DIAGNOSIS — I517 Cardiomegaly: Secondary | ICD-10-CM | POA: Insufficient documentation

## 2019-11-07 DIAGNOSIS — E785 Hyperlipidemia, unspecified: Secondary | ICD-10-CM | POA: Diagnosis not present

## 2019-11-07 DIAGNOSIS — E669 Obesity, unspecified: Secondary | ICD-10-CM | POA: Diagnosis not present

## 2019-11-07 DIAGNOSIS — D649 Anemia, unspecified: Secondary | ICD-10-CM | POA: Insufficient documentation

## 2019-11-07 DIAGNOSIS — I4891 Unspecified atrial fibrillation: Secondary | ICD-10-CM | POA: Diagnosis not present

## 2019-11-07 DIAGNOSIS — I10 Essential (primary) hypertension: Secondary | ICD-10-CM | POA: Diagnosis not present

## 2019-11-07 DIAGNOSIS — K219 Gastro-esophageal reflux disease without esophagitis: Secondary | ICD-10-CM | POA: Diagnosis not present

## 2019-11-07 DIAGNOSIS — Z79899 Other long term (current) drug therapy: Secondary | ICD-10-CM | POA: Insufficient documentation

## 2019-11-07 DIAGNOSIS — F17210 Nicotine dependence, cigarettes, uncomplicated: Secondary | ICD-10-CM | POA: Insufficient documentation

## 2019-11-07 DIAGNOSIS — K921 Melena: Secondary | ICD-10-CM | POA: Insufficient documentation

## 2019-11-07 DIAGNOSIS — K2970 Gastritis, unspecified, without bleeding: Secondary | ICD-10-CM | POA: Diagnosis not present

## 2019-11-07 DIAGNOSIS — Z6831 Body mass index (BMI) 31.0-31.9, adult: Secondary | ICD-10-CM | POA: Diagnosis not present

## 2019-11-07 DIAGNOSIS — J449 Chronic obstructive pulmonary disease, unspecified: Secondary | ICD-10-CM | POA: Diagnosis not present

## 2019-11-07 DIAGNOSIS — K319 Disease of stomach and duodenum, unspecified: Secondary | ICD-10-CM | POA: Diagnosis not present

## 2019-11-07 DIAGNOSIS — G4733 Obstructive sleep apnea (adult) (pediatric): Secondary | ICD-10-CM | POA: Diagnosis not present

## 2019-11-07 DIAGNOSIS — D509 Iron deficiency anemia, unspecified: Secondary | ICD-10-CM | POA: Diagnosis not present

## 2019-11-07 DIAGNOSIS — R195 Other fecal abnormalities: Secondary | ICD-10-CM

## 2019-11-07 DIAGNOSIS — Z794 Long term (current) use of insulin: Secondary | ICD-10-CM | POA: Insufficient documentation

## 2019-11-07 DIAGNOSIS — E1140 Type 2 diabetes mellitus with diabetic neuropathy, unspecified: Secondary | ICD-10-CM | POA: Diagnosis not present

## 2019-11-07 DIAGNOSIS — K7460 Unspecified cirrhosis of liver: Secondary | ICD-10-CM | POA: Insufficient documentation

## 2019-11-07 HISTORY — PX: BIOPSY: SHX5522

## 2019-11-07 HISTORY — PX: ESOPHAGOGASTRODUODENOSCOPY (EGD) WITH PROPOFOL: SHX5813

## 2019-11-07 LAB — GLUCOSE, CAPILLARY: Glucose-Capillary: 207 mg/dL — ABNORMAL HIGH (ref 70–99)

## 2019-11-07 SURGERY — ESOPHAGOGASTRODUODENOSCOPY (EGD) WITH PROPOFOL
Anesthesia: Monitor Anesthesia Care

## 2019-11-07 MED ORDER — LACTATED RINGERS IV SOLN
INTRAVENOUS | Status: AC | PRN
  Administered 2019-11-07: 1000 mL via INTRAVENOUS

## 2019-11-07 MED ORDER — SODIUM CHLORIDE 0.9 % IV SOLN: INTRAVENOUS | Status: DC

## 2019-11-07 MED ORDER — PROPOFOL 500 MG/50ML IV EMUL
INTRAVENOUS | Status: DC | PRN
  Administered 2019-11-07: 250 ug/kg/min via INTRAVENOUS

## 2019-11-07 MED ORDER — LIDOCAINE HCL (CARDIAC) PF 100 MG/5ML IV SOSY
PREFILLED_SYRINGE | INTRAVENOUS | Status: DC | PRN
  Administered 2019-11-07: 100 mg via INTRAVENOUS

## 2019-11-07 MED ORDER — GLYCOPYRROLATE 0.2 MG/ML IJ SOLN
INTRAMUSCULAR | Status: DC | PRN
Start: 2019-11-07 — End: 2019-11-07
  Administered 2019-11-07: .1 mg via INTRAVENOUS

## 2019-11-07 SURGICAL SUPPLY — 14 items

## 2019-11-07 NOTE — Discharge Instructions (Signed)
YOU HAD AN ENDOSCOPIC PROCEDURE TODAY: Refer to the procedure report and other information in the discharge instructions given to you for any specific questions about what was found during the examination. If this information does not answer your questions, please call [place] office at [phone] to clarify.   YOU SHOULD EXPECT: Some feelings of bloating in the abdomen. Passage of more gas than usual. Walking can help get rid of the air that was put into your GI tract during the procedure and reduce the bloating. If you had a lower endoscopy (such as a colonoscopy or flexible sigmoidoscopy) you may notice spotting of blood in your stool or on the toilet paper. Some abdominal soreness may be present for a day or two, also.  DIET: Your first meal following the procedure should be a light meal and then it is ok to progress to your normal diet. A half-sandwich or bowl of soup is an example of a good first meal. Heavy or fried foods are harder to digest and may make you feel nauseous or bloated. Drink plenty of fluids but you should avoid alcoholic beverages for 24 hours. If you had a esophageal dilation, please see attached instructions for diet.    ACTIVITY: Your care partner should take you home directly after the procedure. You should plan to take it easy, moving slowly for the rest of the day. You can resume normal activity the day after the procedure however YOU SHOULD NOT DRIVE, use power tools, machinery or perform tasks that involve climbing or major physical exertion for 24 hours (because of the sedation medicines used during the test).   SYMPTOMS TO REPORT IMMEDIATELY: A gastroenterologist can be reached at any hour. Please call [phone]  for any of the following symptoms:   Following upper endoscopy (EGD, EUS, ERCP, esophageal dilation) Vomiting of blood or coffee ground material  New, significant abdominal pain  New, significant chest pain or pain under the shoulder blades  Painful or  persistently difficult swallowing  New shortness of breath  Black, tarry-looking or red, bloody stools  FOLLOW UP:  If any biopsies were taken you will be contacted by phone or by letter within the next 1-3 weeks. Call [phone]  if you have not heard about the biopsies in 3 weeks.  Please also call with any specific questions about appointments or follow up tests.  

## 2019-11-07 NOTE — Telephone Encounter (Signed)
-----   Message from Milus Banister, MD sent at 11/07/2019  9:19 AM EDT ----- He needs cbc in 85month, my first available OV.  Please remind him that he should stay on iron once daily, probably indefinitely and he should strive with coumadin clinic to keep his blood from getting too thin (INR was 3.3 last week, 5s last month and 4.8 two months ago).  Thanks

## 2019-11-07 NOTE — Anesthesia Postprocedure Evaluation (Signed)
Anesthesia Post Note  Patient: GEVIN PEREA  Procedure(s) Performed: ESOPHAGOGASTRODUODENOSCOPY (EGD) WITH PROPOFOL (N/A ) BIOPSY     Patient location during evaluation: PACU Anesthesia Type: MAC Level of consciousness: awake and alert Pain management: pain level controlled Vital Signs Assessment: post-procedure vital signs reviewed and stable Respiratory status: spontaneous breathing, nonlabored ventilation and respiratory function stable Cardiovascular status: stable and blood pressure returned to baseline Anesthetic complications: no   No complications documented.  Last Vitals:  Vitals:   11/07/19 0940 11/07/19 0950  BP: 133/62 133/64  Pulse: 60 62  Resp: 16 (!) 23  Temp:    SpO2: 96% 97%    Last Pain:  Vitals:   11/07/19 0950  TempSrc:   PainSc: 0-No pain                 Audry Pili

## 2019-11-07 NOTE — Op Note (Signed)
[facility] Patient Name: [name] Procedure Date: 11/07/2019 MRN: [id number] Attending MD: Milus Banister , MD Date of Birth: [birth year] CSN: [id number] Age: 64 Admit Type: Outpatient Procedure:                Upper GI endoscopy Indications:              Anemia (mild, Hb 11.7) with intermittent dark                            stools and intermittent BRBPR, on coumadin with INR                            periodically in supratherapeutic range, known well                            compensated cirrhosis Providers:                Milus Banister, MD, Glori Bickers, RN, Cherylynn Ridges, Technician, Enrigue Catena, CRNA Referring MD:              Medicines:                Monitored Anesthesia Care Complications:            No immediate complications. Estimated blood loss:                            None. Estimated Blood Loss:     Estimated blood loss: none. Procedure:                Pre-Anesthesia Assessment:                           - Prior to the procedure, a History and Physical                            was performed, and patient medications and                            allergies were reviewed. The patient's tolerance of                            previous anesthesia was also reviewed. The risks                            and benefits of the procedure and the sedation                            options and risks were discussed with the patient.                            All questions were answered, and informed consent  was obtained. Prior Anticoagulants: The patient has                            taken Coumadin (warfarin), last dose was 5 days                            prior to procedure. ASA Grade Assessment: IV - A                            patient with severe systemic disease that is a                            constant threat to life. After reviewing the risks                            and benefits,  the patient was deemed in                            satisfactory condition to undergo the procedure.                           After obtaining informed consent, the endoscope was                            passed under direct vision. Throughout the                            procedure, the patient's blood pressure, pulse, and                            oxygen saturations were monitored continuously. The                            GIF-H190 ([id number]) Olympus gastroscope was                            introduced through the mouth, and advanced to the                            second part of duodenum. The upper GI endoscopy was                            accomplished without difficulty. The patient                            tolerated the procedure well. Scope In: Scope Out: Findings:      Mild inflammation characterized by erythema and friability was found in       the entire examined stomach. Biopsies were taken with a cold forceps for       histology.      The exam was otherwise without abnormality. No signs of portal       hypertension. Impression:               - Mild, non-specific  gastritis. Biopsied to check                            for H. pylori.                           - No signs of portal hypertension. Moderate Sedation:      Not Applicable - Patient had care per Anesthesia. Recommendation:           - Patient has a contact number available for                            emergencies. The signs and symptoms of potential                            delayed complications were discussed with the                            patient. Return to normal activities tomorrow.                            Written discharge instructions were provided to the                            patient.                           - Resume previous diet.                           - Continue present medications. Should stay on oral                            iron supplement once daily. OK to resume your                             coumadin today. Should strive to keep INR from                            getting too high (INR was 3.3 last week; 5.9 in                            09/2019; 4.8 in 08/2019). Should consider alternative                            blood thinners if coumadin proves to be too                            difficult to manage.                           - Await pathology results.                           - Dr. Ardis Hughs' office will contact you about return  visit in next few weeks and also repeat CBC in 1                            month. Procedure Code(s):        --- Professional ---                           [phone], Esophagogastroduodenoscopy, flexible,                            transoral; with biopsy, single or multiple Diagnosis Code(s):        --- Professional ---                           K29.70, Gastritis, unspecified, without bleeding                           D50.9, Iron deficiency anemia, unspecified CPT copyright 2019 American Medical Association. All rights reserved. The codes documented in this report are preliminary and upon coder review may  be revised to meet current compliance requirements. Milus Banister, MD 11/07/2019 9:18:29 AM This report has been signed electronically. Number of Addenda: 0

## 2019-11-07 NOTE — Telephone Encounter (Signed)
The patient has been notified of this information and all questions answered.

## 2019-11-07 NOTE — Progress Notes (Signed)
Cardiology Office Note   Date:  11/08/2019   ID:  [name], DOB 10-09-1955, MRN [id number]  PCP:  Timoteo Gaul, FNP  Cardiologist:   Minus Breeding, MD   Chief Complaint  Patient presents with  . Shortness of Breath      History of Present Illness: [name] is a 64 y.o. male who presents for who presents for follow up of atrial fibrillation and atrial flutter. He is s/p flutter ablation in 2014 and has actually done well from this standpoint. He had a cath in May 2018 that showed widely patent coronaries and normal LVF with moderate LVH. He does have significant COPD, sleep apnea (intolerant to C-pap), HTN, chronic back pain, and NIDDM.  He has had increased swelling and SOB.    He has not responded to diuresis.   He was drinking excess fluid and did have salt in his diet.  We have treated him with increased diuresis including Zaroxolyn.    He has had some reported GI bleeding with some red blood.  We sent him for a GI evaluation and he had EGD yesterday.  I reviewed these records for this visit.    He had a friable mucosa but it was suggested that he could resume warfarin.    He has multiple somatic complaints.  He is having pain around his left left leg.  He is not describing substernal chest pain.  He has chronic dyspnea on exertion.  He is not been having any PND or orthopnea.  He has no palpitations, presyncope or syncope.  He has had falls.  He is very limited by his back.  He still reports he is having some red blood in his bowel movements.  However, hemoglobin has been stable.  Past Medical History:  Diagnosis Date  . Arthritis    lower back  . Asthma   . Atrial flutter ([place])    s/p CTI ablation by Dr Rayann Heman  . Brain aneurysm 2009   questionable. A follow up CTA in 2009 showed no evidence of  . Chronic back pain    DDD/stenosis  . Colon polyps    9 polyps removed 10/13/11  . Complication of anesthesia September 11, 2012   slow to awaken after ablation   . COPD (chronic obstructive pulmonary disease) ([place])   . Diabetes mellitus    takes Metformin and Glimepiride daily  . Emphysema   . GERD (gastroesophageal reflux disease)    takes Omeprazole bid  . History of shingles   . HLD (hyperlipidemia)    takes Pravastatin daily  . HTN (hypertension)    takes Prinizide daily  . Obesity   . OSA (obstructive sleep apnea)    not always using cpap  . Overdose 2009   unintentional Flecanide overdose  . Peripheral neuropathy   . Short-term memory loss   . Tobacco abuse     Past Surgical History:  Procedure Laterality Date  . ANTERIOR CERVICAL DECOMP/DISCECTOMY FUSION  01/04/2012   Procedure: ANTERIOR CERVICAL DECOMPRESSION/DISCECTOMY FUSION 1 LEVEL/HARDWARE REMOVAL;  Surgeon: Ophelia Charter, MD;  Location: [place] NEURO ORS;  Service: Neurosurgery;  Laterality: N/A;  explore cervical fusion Cervical six - seven  with removal of codman plate anterior cervical decompression with fusion interbody prothesis plating and bonegraft  . APPENDECTOMY  2-72yrs ago  . ATRIAL FIBRILLATION ABLATION N/A 09/11/2012   PT DID NOT HAVE AN ATRIAL FIBRILLATION ABLATION IN 2014!  ATRIAL FLUTTER ABLATION ONLY  . ATRIAL FLUTTER  ABLATION  09/11/2012   CTI ablation by Dr Rayann Heman  . CARDIAC CATHETERIZATION  2008   no significant CAD  . CARDIOVERSION  05/05/2011   Procedure: CARDIOVERSION;  Surgeon: Lelon Perla, MD;  Location: [facility] ENDOSCOPY;  Service: Cardiovascular;  Laterality: N/A;  . CARDIOVERSION Bilateral 07/26/2012   Procedure: CARDIOVERSION;  Surgeon: Minus Breeding, MD;  Location: [facility] ENDOSCOPY;  Service: Cardiovascular;  Laterality: Bilateral;  . CARPAL TUNNEL RELEASE  99/2000   bilateral  . COLONOSCOPY WITH PROPOFOL N/A 12/13/2012   Procedure: COLONOSCOPY WITH PROPOFOL;  Surgeon: Milus Banister, MD;  Location: [facility] ENDOSCOPY;  Service: Endoscopy;  Laterality: N/A;  . ELECTROPHYSIOLOGIC STUDY N/A 04/21/2015   Procedure: Atrial Fibrillation Ablation;  Surgeon:  Thompson Grayer, MD;  Location: [place] CV LAB;  Service: Cardiovascular;  Laterality: N/A;  . HERNIA REPAIR    . KNEE SURGERY  6-38yrs ago   left  . LEFT HEART CATH AND CORONARY ANGIOGRAPHY N/A 09/19/2016   Procedure: Left Heart Cath and Coronary Angiography;  Surgeon: Belva Crome, MD;  Location: [place] CV LAB;  Service: Cardiovascular;  Laterality: N/A;  . Left inguinal hernia repair     as a child  . NASAL SEPTOPLASTY W/ TURBINOPLASTY Bilateral 02/19/2014   Procedure: NASAL SEPTOPLASTY WITH BILATERAL TURBINATE REDUCTION;  Surgeon: Jodi Marble, MD;  Location: [place];  Service: ENT;  Laterality: Bilateral;  . NECK SURGERY  88yrs ago  . right shoulder surgery  4-32yrs ago   cyst removed  . TEE WITHOUT CARDIOVERSION  05/05/2011   Procedure: TRANSESOPHAGEAL ECHOCARDIOGRAM (TEE);  Surgeon: Lelon Perla, MD;  Location: [facility] ENDOSCOPY;  Service: Cardiovascular;  Laterality: N/A;  . TEE WITHOUT CARDIOVERSION N/A 04/21/2015   Procedure: TRANSESOPHAGEAL ECHOCARDIOGRAM (TEE);  Surgeon: Sueanne Margarita, MD;  Location: [facility] ENDOSCOPY;  Service: Cardiovascular;  Laterality: N/A;  . THROAT SURGERY  4-72yrs ago   "thought " it was cancer but came back not  . TONSILLECTOMY       Current Outpatient Medications  Medication Sig Dispense Refill  . Ascorbic Acid (VITAMIN C PO) Take 1,000 mg by mouth daily.     [name] b complex vitamins tablet Take 1 tablet by mouth daily.     . B-D UF III MINI PEN NEEDLES 31G X 5 MM MISC     . bisoprolol (ZEBETA) 10 MG tablet Take 10 mg by mouth daily.     . cefdinir (OMNICEF) 300 MG capsule Take 300 mg by mouth 2 (two) times daily.     [name] diltiazem (TIAZAC) 120 MG 24 hr capsule Take 1 capsule by mouth daily.     . ferrous sulfate 324 MG TBEC Take 324 mg by mouth daily.     [name] gabapentin (NEURONTIN) 100 MG capsule Take 100-200 mg by mouth See admin instructions. Take 100 mg in the morning and 200 mg at bedtime    . HYDROcodone-acetaminophen (NORCO) 7.5-325 MG tablet Take 1  tablet by mouth 4 (four) times daily.     . Insulin Aspart FlexPen 100 UNIT/ML SOPN INJECT 60 UNITS INTO THE SKIN 3 TIMES DAILY WITH MEALS 60 mL 3  . insulin detemir (LEVEMIR FLEXTOUCH) 100 UNIT/ML FlexPen Inject 90 Units into the skin daily with supper. 10 pen 11  . Insulin Pen Needle (PEN NEEDLES) 30G X 8 MM MISC 1 each by Does not apply route 5 (five) times daily. E11.9 200 each 1  . levocetirizine (XYZAL) 5 MG tablet Take 5 mg by mouth every evening.     [name]  liraglutide (VICTOZA) 18 MG/3ML SOPN Inject 0.3 mLs (1.8 mg total) into the skin daily. 9 mL 11  . losartan (COZAAR) 50 MG tablet Take 1 tablet (50 mg total) by mouth daily. 90 tablet 3  . metFORMIN (GLUCOPHAGE-XR) 500 MG 24 hr tablet Take 1 tablet (500 mg total) by mouth 2 (two) times daily. 180 tablet 3  . metolazone (ZAROXOLYN) 2.5 MG tablet TAKE 1 TABLET ON Tuesday AND Thursday 30 MINUTES BEFORE TAKING TORSEMIDE OR AS DIRECTED 15 tablet 3  . Multiple Vitamin (MULTIVITAMIN WITH MINERALS) TABS Take 1 tablet by mouth daily.     . Multiple Vitamins-Minerals (ZINC PO) Take 50 mg by mouth daily.     . nitroGLYCERIN (NITROSTAT) 0.4 MG SL tablet Place 1 tablet (0.4 mg total) under the tongue every 5 (five) minutes x 3 doses as needed for chest pain. 25 tablet 3  . omeprazole (PRILOSEC) 40 MG capsule Take 1 capsule (40 mg total) by mouth 2 (two) times daily before a meal. 60 capsule 3  . pantoprazole (PROTONIX) 20 MG tablet Take 20 mg by mouth 2 (two) times daily.     . potassium chloride SA (KLOR-CON M20) 20 MEQ tablet Take 1 tablet (20 mEq total) by mouth daily. TAKE AN EXTRA TABLET ON Tuesday AND Thursday 40 tablet 3  . pravastatin (PRAVACHOL) 80 MG tablet TAKE 1 TABLET BY MOUTH DAILY AT BEDTIME 30 tablet 10  . sildenafil (REVATIO) 20 MG tablet Take 20-80 mg by mouth daily as needed (erectile dysfunction).     . torsemide (DEMADEX) 20 MG tablet Take 40 mg by mouth 2 (two) times daily.  180 tablet 3  . umeclidinium-vilanterol (ANORO ELLIPTA)  62.5-25 MCG/INH AEPB Inhale 1 puff into the lungs daily. 1 each 3  . VITAMIN A PO Take 2,400 mcg by mouth daily.     . Vitamin D, Ergocalciferol, (DRISDOL) 1.25 MG (50000 UT) CAPS capsule Take 50,000 Units by mouth every 7 (seven) days.     [name] warfarin (COUMADIN) 5 MG tablet Take 2.5-5 mg by mouth See admin instructions. Tale 2.5 mg on Monday Take 5 mg every other day    . albuterol (PROAIR HFA) 108 (90 Base) MCG/ACT inhaler Inhale 1-2 puffs into the lungs every 6 (six) hours as needed for wheezing or shortness of breath. (Patient not taking: Reported on 11/08/2019) 1 Inhaler 5   No current facility-administered medications for this visit.    Allergies:   Adhesive [tape] and Latex    ROS:  Please see the history of present illness.   Otherwise, review of systems are positive for none.   All other systems are reviewed and negative.    PHYSICAL EXAM: VS:  BP 140/80   Pulse 71   Temp (!) 96.8 F (36 C)   Ht 6\' 3"  (1.905 m)   Wt 263 lb 3.2 oz (119.4 kg)   SpO2 93%   BMI 32.90 kg/m  , BMI Body mass index is 32.9 kg/m. GENERAL:  Well appearing NECK:  No jugular venous distention, waveform within normal limits, carotid upstroke brisk and symmetric, no bruits, no thyromegaly LUNGS:  Clear to auscultation bilaterally CHEST:  Unremarkable HEART:  PMI not displaced or sustained,S1 and S2 within normal limits, no S3, no S4, no clicks, no rubs, 3 out of 6 apical systolic murmur radiating up the aortic outflow tract, no diastolic murmurs ABD:  Flat, positive bowel sounds normal in frequency in pitch, no bruits, no rebound, no guarding, no midline pulsatile mass, no hepatomegaly,  no splenomegaly EXT:  2 plus pulses throughout, no edema, no cyanosis no clubbing   EKG:  EKG is  ordered today. Sinus rhythm, rate 71, axis within normal limits, intervals within normal limits, inferior T wave inversion perhaps slightly more pronounced than previous  Recent Labs: 07/29/2019: TSH 4.330 09/16/2019: BNP  61.2 09/27/2019: ALT 28 10/18/2019: BUN 26; Creatinine, Ser 1.21; Hemoglobin 11.7; Platelets 134; Potassium 3.5; Sodium 142    Lipid Panel    Component Value Date/Time   CHOL 122 08/18/2017 0910   TRIG 400 (H) 08/18/2017 0910   HDL 29 (L) 08/18/2017 0910   CHOLHDL 4.2 08/18/2017 0910   CHOLHDL 5.3 08/27/2016 0235   VLDL 66 (H) 08/27/2016 0235   LDLCALC 13 08/18/2017 0910   LDLDIRECT 99.9 09/08/2011 0843      Wt Readings from Last 3 Encounters:  11/08/19 263 lb 3.2 oz (119.4 kg)  11/07/19 255 lb 1.2 oz (115.7 kg)  10/08/19 264 lb (119.7 kg)      Other studies Reviewed: Additional studies/ records that were reviewed today include: GI study and notes Review of the above records demonstrates:  Please see elsewhere in the note.     ASSESSMENT AND PLAN:   Edema/SOB He had normal coronaries in 2018.   He was to see pulmonary for work up of his COPD.  I have done an extensive work-up as below.  I want him to see Dr. Elsworth Soho to follow-up on the pulmonary cause.  I would consider right heart catheterization.  I am going to check an MRI as belo  There is probably some slight improvement with the diuretic so I will continue this.  COPD (chronic obstructive pulmonary disease) ([place]) He cancelled an appt with pulmonary.  As above I like him to have follow-up.   Essential hypertension The blood pressure is controlled.  No change in therapy.   Sleep apnea He does not sleep with his CPAP anymore.  He can discuss this with his primary.  Non-insulin dependent type 2 diabetes mellitus (HCC) A1c was 8.1.  This is down slightly.   PAF (paroxysmal atrial fibrillation) ([place]) He has had no symptomatic paroxysms of this.  He tolerates anticoagulation and was approved to go back on this.  I am worried about the fall but he is high risk for stroke and has not had any trauma.  He needs to work with his primary on fall management may be PT.  He seen neurosurgery and is not an operative candidate with  potential for good results apparently.   LVH:  He has moderate LVH with biatrial enlargement.  PYP scan was indeterminate.   There was no monoclonal spike on blood work.  Given the constellation of problems I will check a cardiac MRI.  If this is negative aside from the right heart cath I will have further work-up that I think might be helpful.  I do note he has as needed-we will follow up with echocardiography in the future.   Covid Education He has had both vaccines.   Current medicines are reviewed at length with the patient today.  The patient does not have concerns regarding medicines.  The following changes have been made:  no change  Labs/ tests ordered today include: none  Orders Placed This Encounter  Procedures  . MR Card Morphology Wo/W Cm  . Basic metabolic panel  . EKG 12-Lead     Disposition:   FU with APP in four months.   Signed, Minus Breeding, MD  11/08/2019 9:58 AM    [facility]

## 2019-11-07 NOTE — Interval H&P Note (Signed)
History and Physical Interval Note:  11/07/2019 7:28 AM  [name]  has presented today for surgery, with the diagnosis of Cirrhosis, heme positive stool, anemia.  The various methods of treatment have been discussed with the patient and family. After consideration of risks, benefits and other options for treatment, the patient has consented to  Procedure(s): ESOPHAGOGASTRODUODENOSCOPY (EGD) WITH PROPOFOL (N/A) as a surgical intervention.  The patient's history has been reviewed, patient examined, no change in status, stable for surgery.  I have reviewed the patient's chart and labs.  Questions were answered to the patient's satisfaction.     Milus Banister

## 2019-11-07 NOTE — Telephone Encounter (Signed)
Labs have been entered Follow up on 01/06/20 at 830 am with Dr Ardis Hughs   Left message on machine to call back

## 2019-11-07 NOTE — Anesthesia Preprocedure Evaluation (Addendum)
Anesthesia Evaluation  Patient identified by MRN, date of birth, ID band Patient awake    Reviewed: Allergy & Precautions, NPO status , Patient's Chart, lab work & pertinent test results  History of Anesthesia Complications Negative for: history of anesthetic complications  Airway Mallampati: II  TM Distance: >3 FB Neck ROM: Limited    Dental  (+) Edentulous Upper, Edentulous Lower   Pulmonary asthma , sleep apnea (noncompliant) , COPD,  COPD inhaler, Current SmokerPatient did not abstain from smoking.,    Pulmonary exam normal        Cardiovascular hypertension, Pt. on medications Normal cardiovascular exam+ dysrhythmias Atrial Fibrillation      Neuro/Psych  Questionable hx of aneurysm   Neuromuscular disease (neuropathy) negative psych ROS   GI/Hepatic GERD  Controlled and Medicated,(+) Cirrhosis       ,   Endo/Other  diabetes, Type 2, Insulin Dependent, Oral Hypoglycemic Agents Obesity   Renal/GU negative Renal ROS     Musculoskeletal  (+) Arthritis ,   Abdominal   Peds  Hematology  INR 3.3 on 6/14    Anesthesia Other Findings Covid test negative   Reproductive/Obstetrics                            Anesthesia Physical Anesthesia Plan  ASA: III  Anesthesia Plan: MAC   Post-op Pain Management:    Induction: Intravenous  PONV Risk Score and Plan: 1 and Propofol infusion and Treatment may vary due to age or medical condition  Airway Management Planned: Nasal Cannula and Natural Airway  Additional Equipment: None  Intra-op Plan:   Post-operative Plan:   Informed Consent: I have reviewed the patients History and Physical, chart, labs and discussed the procedure including the risks, benefits and alternatives for the proposed anesthesia with the patient or authorized representative who has indicated his/her understanding and acceptance.       Plan Discussed with: CRNA  and Anesthesiologist  Anesthesia Plan Comments:         Anesthesia Quick Evaluation

## 2019-11-07 NOTE — Anesthesia Procedure Notes (Addendum)
Procedure Name: MAC Date/Time: 11/07/2019 9:00 AM Performed by: Lissa Morales, CRNA Pre-anesthesia Checklist: Emergency Drugs available, Suction available, Patient being monitored and Timeout performed Patient Re-evaluated:Patient Re-evaluated prior to induction Oxygen Delivery Method: Simple face mask Preoxygenation: pom mask. Placement Confirmation: positive ETCO2

## 2019-11-07 NOTE — Transfer of Care (Signed)
Immediate Anesthesia Transfer of Care Note  Patient: DEWON MENDIZABAL  Procedure(s) Performed: ESOPHAGOGASTRODUODENOSCOPY (EGD) WITH PROPOFOL (N/A ) BIOPSY  Patient Location: PACU  Anesthesia Type:MAC  Level of Consciousness: awake, alert  and patient cooperative  Airway & Oxygen Therapy: Patient Spontanous Breathing and Patient connected to face mask oxygen  Post-op Assessment: Report given to RN, Post -op Vital signs reviewed and stable and Patient moving all extremities X 4  Post vital signs: stable  Last Vitals:  Vitals Value Taken Time  BP 97/56 11/07/19 0920  Temp    Pulse 62 11/07/19 0922  Resp 15 11/07/19 0922  SpO2 100 % 11/07/19 0922  Vitals shown include unvalidated device data.  Last Pain:  Vitals:   11/07/19 0719  TempSrc: Axillary  PainSc:          Complications: No complications documented.

## 2019-11-08 ENCOUNTER — Ambulatory Visit (INDEPENDENT_AMBULATORY_CARE_PROVIDER_SITE_OTHER): Payer: 59 | Admitting: Cardiology

## 2019-11-08 ENCOUNTER — Other Ambulatory Visit: Payer: Self-pay

## 2019-11-08 ENCOUNTER — Encounter: Payer: Self-pay | Admitting: Cardiology

## 2019-11-08 VITALS — BP 140/80 | HR 71 | Temp 96.8°F | Ht 75.0 in | Wt 263.2 lb

## 2019-11-08 DIAGNOSIS — R0602 Shortness of breath: Secondary | ICD-10-CM | POA: Diagnosis not present

## 2019-11-08 DIAGNOSIS — Z7189 Other specified counseling: Secondary | ICD-10-CM

## 2019-11-08 DIAGNOSIS — I517 Cardiomegaly: Secondary | ICD-10-CM

## 2019-11-08 DIAGNOSIS — J449 Chronic obstructive pulmonary disease, unspecified: Secondary | ICD-10-CM | POA: Diagnosis not present

## 2019-11-08 DIAGNOSIS — I10 Essential (primary) hypertension: Secondary | ICD-10-CM | POA: Diagnosis not present

## 2019-11-08 LAB — SURGICAL PATHOLOGY

## 2019-11-08 NOTE — Patient Instructions (Addendum)
Medication Instructions:  Your physician recommends that you continue on your current medications as directed. Please refer to the Current Medication list given to you today.  *If you need a refill on your cardiac medications before your next appointment, please call your pharmacy*  Lab Work: BMET 1 WEEK PRIOR TO MRI   Testing/Procedures: Your physician has requested that you have a cardiac MRI. Cardiac MRI uses a computer to create images of your heart as its beating, producing both still and moving pictures of your heart and major blood vessels. For further information please visit [email or web address] Please follow the instruction sheet given to you today for more information.  Follow-Up: At [facility], you and your health needs are our priority.  As part of our continuing mission to provide you with exceptional heart care, we have created designated Provider Care Teams.  These Care Teams include your primary Cardiologist (physician) and Advanced Practice Providers (APPs -  Physician Assistants and Nurse Practitioners) who all work together to provide you with the care you need, when you need it.  We recommend signing up for the patient portal called "MyChart".  Sign up information is provided on this After Visit Summary.  MyChart is used to connect with patients for Virtual Visits (Telemedicine).  Patients are able to view lab/test results, encounter notes, upcoming appointments, etc.  Non-urgent messages can be sent to your provider as well.   To learn more about what you can do with MyChart, go to [email or web address].    Your next appointment:   4 month(s)  The format for your next appointment:   In Person  Provider:   Doreene Burke PA

## 2019-11-08 NOTE — Progress Notes (Signed)
Provided a copy of a [facility] PATIENT ASSISTANCE PROGRAM ENROLLMENT FORM. This is an application to assist patient with the cost of their Albuterol. Patient must have part of the application completed by their PCP.

## 2019-11-13 ENCOUNTER — Encounter: Payer: Self-pay | Admitting: Cardiology

## 2019-11-13 ENCOUNTER — Telehealth: Payer: Self-pay | Admitting: Cardiology

## 2019-11-13 ENCOUNTER — Other Ambulatory Visit: Payer: Self-pay

## 2019-11-13 ENCOUNTER — Telehealth: Payer: Self-pay | Admitting: Endocrinology

## 2019-11-13 DIAGNOSIS — E119 Type 2 diabetes mellitus without complications: Secondary | ICD-10-CM

## 2019-11-13 MED ORDER — LEVEMIR FLEXTOUCH 100 UNIT/ML ~~LOC~~ SOPN: 110.0000 [IU] | PEN_INJECTOR | Freq: Every day | SUBCUTANEOUS | 11 refills | Status: DC

## 2019-11-13 NOTE — Telephone Encounter (Signed)
Returned call to pt notified that we do not have "open MRI's" verbalizes understanding. He will call PCP for rx for anxiety.

## 2019-11-13 NOTE — Telephone Encounter (Signed)
Patient called to advise that his blood sugars are running between 250-300 everyday, all day long, and that he wants more medication called in for him.  I was not able to find him and earlier appointment and he adamantly declined the offer of being placed on a wait list in case an earlier appointment came available.   He feels that we should give him an additional medication and then when he sees Dr Loanne Drilling on 11/21/2019 his blood work and sugar logs can show if there is any improvement.  Pharmacy is [facility]

## 2019-11-13 NOTE — Telephone Encounter (Signed)
Below is FYI:  Called pt and informed about new orders. He did not agree with this change but stated that he will "try it" and readdress at his next appt. Pt further added, Dr. Loanne Drilling knows "it" (referring to Levemir) is not working for me, continuing to state, "he knows Novolog is the only thing that works for me". Pt then proceeded to state that he has informed Dr. Loanne Drilling on each visit that he has an allergy to either Levemir or Novolog. Added "it causes me to itch and I have to take an allergy pill every day." Further states, "something has got to give." Reassured pt that we will address all concerns at this next office visit but to try the new order as Rx'd by Dr. Loanne Drilling. Pt agreed.

## 2019-11-13 NOTE — Telephone Encounter (Signed)
Please increase the levemir for now.  When you are here, let's consider alternatives

## 2019-11-13 NOTE — Telephone Encounter (Signed)
° ° °  Pt would like to know if his MRI scheduled on 12/16/19 is an open MRI

## 2019-11-13 NOTE — Telephone Encounter (Signed)
Please increase levemir to 110 units daily.  I'll see you next week.

## 2019-11-13 NOTE — Telephone Encounter (Signed)
Spoke with patient regarding appointment for Cardiac MRI scheduled Monday 12/16/19 at 8:00 am at [facility]---arrival time is 7:30 am 1st floor admissions office.  Will mail information to patient and he voiced his understanding.

## 2019-11-13 NOTE — Telephone Encounter (Signed)
Please refer to pt request below

## 2019-11-21 ENCOUNTER — Encounter: Payer: Self-pay | Admitting: Endocrinology

## 2019-11-21 ENCOUNTER — Ambulatory Visit (INDEPENDENT_AMBULATORY_CARE_PROVIDER_SITE_OTHER): Payer: 59 | Admitting: Endocrinology

## 2019-11-21 ENCOUNTER — Telehealth: Payer: Self-pay | Admitting: Endocrinology

## 2019-11-21 ENCOUNTER — Other Ambulatory Visit: Payer: Self-pay

## 2019-11-21 VITALS — BP 128/64 | HR 73 | Ht 75.0 in | Wt 270.2 lb

## 2019-11-21 DIAGNOSIS — Z794 Long term (current) use of insulin: Secondary | ICD-10-CM

## 2019-11-21 DIAGNOSIS — E119 Type 2 diabetes mellitus without complications: Secondary | ICD-10-CM

## 2019-11-21 LAB — POCT GLYCOSYLATED HEMOGLOBIN (HGB A1C): Hemoglobin A1C: 8.4 % — AB (ref 4.0–5.6)

## 2019-11-21 LAB — GLUCOSE, POCT (MANUAL RESULT ENTRY): POC Glucose: 267 mg/dl — AB (ref 70–99)

## 2019-11-21 MED ORDER — LEVEMIR FLEXTOUCH 100 UNIT/ML ~~LOC~~ SOPN: 330.0000 [IU] | PEN_INJECTOR | SUBCUTANEOUS | 11 refills | Status: DC

## 2019-11-21 NOTE — Patient Instructions (Addendum)
Please change the Levemir 330 units each morning and: Please continue the same Victoza.   Please stop taking the metformin and novolog check your blood sugar twice a day.  vary the time of day when you check, between before the 3 meals, and at bedtime.  also check if you have symptoms of your blood sugar being too high or too low.  please keep a record of the readings and bring it to your next appointment here (or you can bring the meter itself).  You can write it on any piece of paper.  please call us sooner if your blood sugar goes below 70, or if you have a lot of readings over 200. Please call or message [place] next week, to tell us how the blood sugar is doing, as this is a starting point on the amount of levemir Please come back for a follow-up appointment in 2 months.

## 2019-11-21 NOTE — Telephone Encounter (Signed)
Patient called stating on his AVS from today, it states to please continue taking victoza. Patient states Dr Loanne Drilling advised patient to stop taking this medication about a month ago - please advise. Ph# [phone].

## 2019-11-21 NOTE — Progress Notes (Signed)
Subjective:    Patient ID: [name], male    DOB: 08/11/1955, 64 y.o.   MRN: [id number]  HPI Pt returns for f/u of diabetes mellitus:  DM type: Insulin-requiring type 2 Dx'ed: 6160 Complications: stage 3 CRI Therapy: insulin since 2019, metformin, and Victoza.  DKA: never Severe hypoglycemia: never Pancreatitis: never Pancreatic imaging: normal on 2017 CT.  SDOH: none Other: he takes multiple daily injections; he did not tolerate Jardiance (dehydration); metformin dosage is limited by abd pain.   Interval history: Pt says he seldom misses the insulin.  no cbg record, but states cbg's vary from 200-350.   Main symptom is again fatigue.  He is discouraged about chronic illness in general, and in particular about how many meds he takes.   Past Medical History:  Diagnosis Date  . Arthritis    lower back  . Asthma   . Atrial flutter ([place])    s/p CTI ablation by Dr Rayann Heman  . Brain aneurysm 2009   questionable. A follow up CTA in 2009 showed no evidence of  . Chronic back pain    DDD/stenosis  . Colon polyps    9 polyps removed 10/13/11  . Complication of anesthesia September 11, 2012   slow to awaken after ablation  . COPD (chronic obstructive pulmonary disease) ([place])   . Diabetes mellitus    takes Metformin and Glimepiride daily  . Emphysema   . GERD (gastroesophageal reflux disease)    takes Omeprazole bid  . History of shingles   . HLD (hyperlipidemia)    takes Pravastatin daily  . HTN (hypertension)    takes Prinizide daily  . Obesity   . OSA (obstructive sleep apnea)    not always using cpap  . Overdose 2009   unintentional Flecanide overdose  . Peripheral neuropathy   . Short-term memory loss   . Tobacco abuse     Past Surgical History:  Procedure Laterality Date  . ANTERIOR CERVICAL DECOMP/DISCECTOMY FUSION  01/04/2012   Procedure: ANTERIOR CERVICAL DECOMPRESSION/DISCECTOMY FUSION 1 LEVEL/HARDWARE REMOVAL;  Surgeon: Ophelia Charter, MD;  Location: [facility]  [facility];  Service: Neurosurgery;  Laterality: N/A;  explore cervical fusion Cervical six - seven  with removal of codman plate anterior cervical decompression with fusion interbody prothesis plating and bonegraft  . APPENDECTOMY  2-53yrs ago  . ATRIAL FIBRILLATION ABLATION N/A 09/11/2012   PT DID NOT HAVE AN ATRIAL FIBRILLATION ABLATION IN 2014!  ATRIAL FLUTTER ABLATION ONLY  . ATRIAL FLUTTER ABLATION  09/11/2012   CTI ablation by Dr Rayann Heman  . BIOPSY  11/07/2019   Procedure: BIOPSY;  Surgeon: Milus Banister, MD;  Location: [facility] ENDOSCOPY;  Service: Endoscopy;;  . CARDIAC CATHETERIZATION  2008   no significant CAD  . CARDIOVERSION  05/05/2011   Procedure: CARDIOVERSION;  Surgeon: Lelon Perla, MD;  Location: [facility] ENDOSCOPY;  Service: Cardiovascular;  Laterality: N/A;  . CARDIOVERSION Bilateral 07/26/2012   Procedure: CARDIOVERSION;  Surgeon: Minus Breeding, MD;  Location: [facility] ENDOSCOPY;  Service: Cardiovascular;  Laterality: Bilateral;  . CARPAL TUNNEL RELEASE  99/2000   bilateral  . COLONOSCOPY WITH PROPOFOL N/A 12/13/2012   Procedure: COLONOSCOPY WITH PROPOFOL;  Surgeon: Milus Banister, MD;  Location: [facility] ENDOSCOPY;  Service: Endoscopy;  Laterality: N/A;  . ELECTROPHYSIOLOGIC STUDY N/A 04/21/2015   Procedure: Atrial Fibrillation Ablation;  Surgeon: Thompson Grayer, MD;  Location: [place] CV LAB;  Service: Cardiovascular;  Laterality: N/A;  . ESOPHAGOGASTRODUODENOSCOPY (EGD) WITH PROPOFOL N/A 11/07/2019   Procedure: ESOPHAGOGASTRODUODENOSCOPY (  EGD) WITH PROPOFOL;  Surgeon: Milus Banister, MD;  Location: [facility] ENDOSCOPY;  Service: Endoscopy;  Laterality: N/A;  . HERNIA REPAIR    . KNEE SURGERY  6-45yrs ago   left  . LEFT HEART CATH AND CORONARY ANGIOGRAPHY N/A 09/19/2016   Procedure: Left Heart Cath and Coronary Angiography;  Surgeon: Belva Crome, MD;  Location: [place] CV LAB;  Service: Cardiovascular;  Laterality: N/A;  . Left inguinal hernia repair     as a child  . NASAL SEPTOPLASTY W/  TURBINOPLASTY Bilateral 02/19/2014   Procedure: NASAL SEPTOPLASTY WITH BILATERAL TURBINATE REDUCTION;  Surgeon: Jodi Marble, MD;  Location: [facility];  Service: ENT;  Laterality: Bilateral;  . NECK SURGERY  19yrs ago  . right shoulder surgery  4-108yrs ago   cyst removed  . TEE WITHOUT CARDIOVERSION  05/05/2011   Procedure: TRANSESOPHAGEAL ECHOCARDIOGRAM (TEE);  Surgeon: Lelon Perla, MD;  Location: [facility] ENDOSCOPY;  Service: Cardiovascular;  Laterality: N/A;  . TEE WITHOUT CARDIOVERSION N/A 04/21/2015   Procedure: TRANSESOPHAGEAL ECHOCARDIOGRAM (TEE);  Surgeon: Sueanne Margarita, MD;  Location: [facility] ENDOSCOPY;  Service: Cardiovascular;  Laterality: N/A;  . THROAT SURGERY  4-19yrs ago   "thought " it was cancer but came back not  . TONSILLECTOMY      Social History   Socioeconomic History  . Marital status: Married    Spouse name: Not on file  . Number of children: 2  . Years of education: Not on file  . Highest education level: Not on file  Occupational History  . Occupation: Dealer   Tobacco Use  . Smoking status: Current Every Day Smoker    Packs/day: 1.00    Years: 49.00    Pack years: 49.00    Types: Cigarettes  . Smokeless tobacco: Never Used  . Tobacco comment: He is going to try 1-800-QUIT Now for nicotine replacement therapy  Vaping Use  . Vaping Use: Never used  Substance and Sexual Activity  . Alcohol use: Not Currently    Alcohol/week: 0.0 standard drinks    Comment: 12/05/17 pt stated he has drinked 1/2 drink in 3 years  . Drug use: No  . Sexual activity: Yes  Other Topics Concern  . Not on file  Social History Narrative   Daily caffeine(Mountain Dew)       Lives in [place] with spouse.   Unemployed due to chronic back/ leg pain         Social Determinants of Health   Financial Resource Strain:   . Difficulty of Paying Living Expenses:   Food Insecurity:   . Worried About Charity fundraiser in the Last Year:   . Arboriculturist in the Last Year:     Transportation Needs:   . Film/video editor (Medical):   [name] Lack of Transportation (Non-Medical):   Physical Activity:   . Days of Exercise per Week:   . Minutes of Exercise per Session:   Stress:   . Feeling of Stress :   Social Connections:   . Frequency of Communication with Friends and Family:   . Frequency of Social Gatherings with Friends and Family:   . Attends Religious Services:   . Active Member of Clubs or Organizations:   . Attends Archivist Meetings:   [name] Marital Status:   Intimate Partner Violence:   . Fear of Current or Ex-Partner:   . Emotionally Abused:   [name] Physically Abused:   . Sexually Abused:  Current Outpatient Medications on File Prior to Visit  Medication Sig Dispense Refill  . albuterol (PROAIR HFA) 108 (90 Base) MCG/ACT inhaler Inhale 1-2 puffs into the lungs every 6 (six) hours as needed for wheezing or shortness of breath. 1 Inhaler 5  . Ascorbic Acid (VITAMIN C PO) Take 1,000 mg by mouth daily.     [name] b complex vitamins tablet Take 1 tablet by mouth daily.     . B-D UF III MINI PEN NEEDLES 31G X 5 MM MISC     . bisoprolol (ZEBETA) 10 MG tablet Take 10 mg by mouth daily.     . cefdinir (OMNICEF) 300 MG capsule Take 300 mg by mouth 2 (two) times daily.     [name] diltiazem (TIAZAC) 120 MG 24 hr capsule Take 1 capsule by mouth daily.     . ferrous sulfate 324 MG TBEC Take 324 mg by mouth daily.     [name] gabapentin (NEURONTIN) 100 MG capsule Take 100-200 mg by mouth See admin instructions. Take 100 mg in the morning and 200 mg at bedtime    . HYDROcodone-acetaminophen (NORCO) 7.5-325 MG tablet Take 1 tablet by mouth 4 (four) times daily.     . Insulin Pen Needle (PEN NEEDLES) 30G X 8 MM MISC 1 each by Does not apply route 5 (five) times daily. E11.9 200 each 1  . levocetirizine (XYZAL) 5 MG tablet Take 5 mg by mouth every evening.     . liraglutide (VICTOZA) 18 MG/3ML SOPN Inject 0.3 mLs (1.8 mg total) into the skin daily. 9 mL 11  . losartan  (COZAAR) 50 MG tablet Take 1 tablet (50 mg total) by mouth daily. 90 tablet 3  . metolazone (ZAROXOLYN) 2.5 MG tablet TAKE 1 TABLET ON Tuesday AND Thursday 30 MINUTES BEFORE TAKING TORSEMIDE OR AS DIRECTED 15 tablet 3  . Multiple Vitamin (MULTIVITAMIN WITH MINERALS) TABS Take 1 tablet by mouth daily.     . Multiple Vitamins-Minerals (ZINC PO) Take 50 mg by mouth daily.     . nitroGLYCERIN (NITROSTAT) 0.4 MG SL tablet Place 1 tablet (0.4 mg total) under the tongue every 5 (five) minutes x 3 doses as needed for chest pain. 25 tablet 3  . omeprazole (PRILOSEC) 40 MG capsule Take 1 capsule (40 mg total) by mouth 2 (two) times daily before a meal. 60 capsule 3  . pantoprazole (PROTONIX) 20 MG tablet Take 20 mg by mouth 2 (two) times daily.     . potassium chloride SA (KLOR-CON M20) 20 MEQ tablet Take 1 tablet (20 mEq total) by mouth daily. TAKE AN EXTRA TABLET ON Tuesday AND Thursday 40 tablet 3  . pravastatin (PRAVACHOL) 80 MG tablet TAKE 1 TABLET BY MOUTH DAILY AT BEDTIME 30 tablet 10  . sildenafil (REVATIO) 20 MG tablet Take 20-80 mg by mouth daily as needed (erectile dysfunction).     . torsemide (DEMADEX) 20 MG tablet Take 40 mg by mouth 2 (two) times daily.  180 tablet 3  . umeclidinium-vilanterol (ANORO ELLIPTA) 62.5-25 MCG/INH AEPB Inhale 1 puff into the lungs daily. 1 each 3  . VITAMIN A PO Take 2,400 mcg by mouth daily.     . Vitamin D, Ergocalciferol, (DRISDOL) 1.25 MG (50000 UT) CAPS capsule Take 50,000 Units by mouth every 7 (seven) days.     [name] warfarin (COUMADIN) 5 MG tablet Take 2.5-5 mg by mouth See admin instructions. Tale 2.5 mg on Monday Take 5 mg every other day     No current  facility-administered medications on file prior to visit.    Allergies  Allergen Reactions  . Adhesive [Tape]     itching  . Latex Itching    When tape is on the skin too long skin gets red & itching    Family History  Problem Relation Age of Onset  . Diabetes Mother   . Heart disease Father   .  Lung cancer Father   . Diabetes Maternal Grandmother   . Colon cancer Neg Hx   . Anesthesia problems Neg Hx   . Hypotension Neg Hx   . Malignant hyperthermia Neg Hx   . Pseudochol deficiency Neg Hx   . Esophageal cancer Neg Hx   . Pancreatic cancer Neg Hx   . Stomach cancer Neg Hx     BP 128/64   Pulse 73   Ht 6\' 3"  (1.905 m)   Wt 270 lb 3.2 oz (122.6 kg)   SpO2 96%   BMI 33.77 kg/m    Review of Systems He denies hypoglycemia.      Objective:   Physical Exam VITAL SIGNS:  See vs page GENERAL: no distress Pulses: dorsalis pedis intact bilat.   MSK: no deformity of the feet CV: 1+ bilat leg edema Skin:  no ulcer on the feet, but the skin is dry.  normal temp on the feet.  Both legs and feet are mildly erythematous. Neuro: sensation is intact to touch on the feet, but decreased from normal.   Ext: there is bilateral onychomycosis of the toenails.     Lab Results  Component Value Date   HGBA1C 8.4 (A) 11/21/2019   Lab Results  Component Value Date   TSH 4.330 07/29/2019   Lab Results  Component Value Date   CREATININE 1.21 10/18/2019   BUN 26 10/18/2019   NA 142 10/18/2019   K 3.5 10/18/2019   CL 99 10/18/2019   CO2 24 10/18/2019       Assessment & Plan:  Insulin-requiring type 2 DM: worse.  He seems to be developing "burnout" from chronic disease.  To help this, we'll reduce number of meds.    Patient Instructions  Please change the Levemir 330 units each morning and: Please continue the same Victoza.   Please stop taking the metformin and novolog check your blood sugar twice a day.  vary the time of day when you check, between before the 3 meals, and at bedtime.  also check if you have symptoms of your blood sugar being too high or too low.  please keep a record of the readings and bring it to your next appointment here (or you can bring the meter itself).  You can write it on any piece of paper.  please call us sooner if your blood sugar goes below 70, or  if you have a lot of readings over 200. Please call or message [place] next week, to tell us how the blood sugar is doing, as this is a starting point on the amount of levemir Please come back for a follow-up appointment in 2 months.

## 2019-11-22 ENCOUNTER — Telehealth: Payer: Self-pay | Admitting: Gastroenterology

## 2019-11-22 ENCOUNTER — Telehealth: Payer: Self-pay | Admitting: Endocrinology

## 2019-11-22 NOTE — Telephone Encounter (Signed)
OK, if you are off the Victoza, please stay off

## 2019-11-22 NOTE — Telephone Encounter (Signed)
PLEASE take the Levemir

## 2019-11-22 NOTE — Telephone Encounter (Signed)
Pt states he think Levemir is causing him pain in his "gut". Pt would like to know what he needs to do.

## 2019-11-22 NOTE — Telephone Encounter (Signed)
Please advise 

## 2019-11-22 NOTE — Telephone Encounter (Signed)
Patient requests to be called at ph# [phone] re: Patient's blood sugars have continued to be running high (running between 325 and 350). Patient states he is hungry and has been waiting to be called back since 7:30 a.m. this morning. Patient states his gut hurts. Patient states Daisy Blossom is not working no matter when or how he takes it.

## 2019-11-22 NOTE — Telephone Encounter (Signed)
The pt wanted to discuss levemir. I advised him he would need to call the precriber and discuss his complaints.  He was unhappy with that information and asked if we have a endocrinologist in our office.  I advised that we are GI and he says he will call his endocrinologist and discuss.

## 2019-11-22 NOTE — Telephone Encounter (Signed)
Returned pt call and informed him about Dr. Cordelia Pen response below. Pt insisted he is not going to take the medication because it causes him to have abd pain, rash and elevated CBG's. Demanded to be seen either Monday or Tuesday. Pt has been scheduled at his request as follows:  11/26/19 @ 9:30am  Pt states until he is seen, he will ONLY take Novolog and will NOT take Levemir.

## 2019-11-22 NOTE — Telephone Encounter (Signed)
Called [name] and informed about Dr. Cordelia Pen response below. While on the phone with [name], he states he has chosen to stop his Levemir. States he is having abdominal pain and will not take it. Since he is on nothing and his CBG was above 400, wants to know what to do. Advised I will send this message to Dr. Loanne Drilling for further advice. Verbalized acceptance and understanding.

## 2019-11-22 NOTE — Telephone Encounter (Signed)
PLEASE take the Levemir.  It helps you feel better

## 2019-11-25 ENCOUNTER — Other Ambulatory Visit: Payer: Self-pay

## 2019-11-25 ENCOUNTER — Ambulatory Visit (INDEPENDENT_AMBULATORY_CARE_PROVIDER_SITE_OTHER): Payer: 59 | Admitting: Pharmacist Clinician (PhC)/ Clinical Pharmacy Specialist

## 2019-11-25 DIAGNOSIS — I4811 Longstanding persistent atrial fibrillation: Secondary | ICD-10-CM | POA: Diagnosis not present

## 2019-11-25 DIAGNOSIS — Z7901 Long term (current) use of anticoagulants: Secondary | ICD-10-CM

## 2019-11-25 DIAGNOSIS — I480 Paroxysmal atrial fibrillation: Secondary | ICD-10-CM | POA: Diagnosis not present

## 2019-11-25 DIAGNOSIS — I4892 Unspecified atrial flutter: Secondary | ICD-10-CM

## 2019-11-25 LAB — POCT INR: INR: 2.7 (ref 2.0–3.0)

## 2019-11-25 NOTE — Telephone Encounter (Signed)
Error

## 2019-11-26 ENCOUNTER — Ambulatory Visit (INDEPENDENT_AMBULATORY_CARE_PROVIDER_SITE_OTHER): Payer: 59 | Admitting: Endocrinology

## 2019-11-26 VITALS — BP 150/66 | HR 74 | Ht 75.0 in | Wt 265.4 lb

## 2019-11-26 DIAGNOSIS — E1121 Type 2 diabetes mellitus with diabetic nephropathy: Secondary | ICD-10-CM | POA: Diagnosis not present

## 2019-11-26 DIAGNOSIS — N1831 Chronic kidney disease, stage 3a: Secondary | ICD-10-CM | POA: Diagnosis not present

## 2019-11-26 DIAGNOSIS — Z794 Long term (current) use of insulin: Secondary | ICD-10-CM

## 2019-11-26 MED ORDER — TOUJEO MAX SOLOSTAR 300 UNIT/ML ~~LOC~~ SOPN: 160.0000 [IU] | PEN_INJECTOR | SUBCUTANEOUS | 11 refills | Status: DC

## 2019-11-26 NOTE — Progress Notes (Signed)
Subjective:    Patient ID: [name], male    DOB: 10/21/1955, 64 y.o.   MRN: [id number]  HPI Pt returns for f/u of diabetes mellitus:  DM type: Insulin-requiring type 2 Dx'ed: [id number] Complications: stage 3 CRI Therapy: insulin since 2019.   DKA: never Severe hypoglycemia: never Pancreatitis: never Pancreatic imaging: normal on 2017 CT.  SDOH: none Other: he takes multiple daily injections; he did not tolerate Jardiance (dehydration); metformin dosage is limited by abd pain.   Interval history: Pt says he is having hives on the torso after each dose of levemir.  He says cbg's are approx 300.  He takes metformin and PRN Novolog.   Past Medical History:  Diagnosis Date  . Arthritis    lower back  . Asthma   . Atrial flutter ([place])    s/p CTI ablation by Dr Rayann Heman  . Brain aneurysm 2009   questionable. A follow up CTA in 2009 showed no evidence of  . Chronic back pain    DDD/stenosis  . Colon polyps    9 polyps removed 10/13/11  . Complication of anesthesia September 11, 2012   slow to awaken after ablation  . COPD (chronic obstructive pulmonary disease) ([place])   . Diabetes mellitus    takes Metformin and Glimepiride daily  . Emphysema   . GERD (gastroesophageal reflux disease)    takes Omeprazole bid  . History of shingles   . HLD (hyperlipidemia)    takes Pravastatin daily  . HTN (hypertension)    takes Prinizide daily  . Obesity   . OSA (obstructive sleep apnea)    not always using cpap  . Overdose 2009   unintentional Flecanide overdose  . Peripheral neuropathy   . Short-term memory loss   . Tobacco abuse     Past Surgical History:  Procedure Laterality Date  . ANTERIOR CERVICAL DECOMP/DISCECTOMY FUSION  01/04/2012   Procedure: ANTERIOR CERVICAL DECOMPRESSION/DISCECTOMY FUSION 1 LEVEL/HARDWARE REMOVAL;  Surgeon: Ophelia Charter, MD;  Location: [facility];  Service: Neurosurgery;  Laterality: N/A;  explore cervical fusion Cervical six - seven  with removal of  codman plate anterior cervical decompression with fusion interbody prothesis plating and bonegraft  . APPENDECTOMY  2-60yrs ago  . ATRIAL FIBRILLATION ABLATION N/A 09/11/2012   PT DID NOT HAVE AN ATRIAL FIBRILLATION ABLATION IN 2014!  ATRIAL FLUTTER ABLATION ONLY  . ATRIAL FLUTTER ABLATION  09/11/2012   CTI ablation by Dr Rayann Heman  . BIOPSY  11/07/2019   Procedure: BIOPSY;  Surgeon: Milus Banister, MD;  Location: [facility];  Service: Endoscopy;;  . CARDIAC CATHETERIZATION  2008   no significant CAD  . CARDIOVERSION  05/05/2011   Procedure: CARDIOVERSION;  Surgeon: Lelon Perla, MD;  Location: [facility] ENDOSCOPY;  Service: Cardiovascular;  Laterality: N/A;  . CARDIOVERSION Bilateral 07/26/2012   Procedure: CARDIOVERSION;  Surgeon: Minus Breeding, MD;  Location: [facility] ENDOSCOPY;  Service: Cardiovascular;  Laterality: Bilateral;  . CARPAL TUNNEL RELEASE  99/2000   bilateral  . COLONOSCOPY WITH PROPOFOL N/A 12/13/2012   Procedure: COLONOSCOPY WITH PROPOFOL;  Surgeon: Milus Banister, MD;  Location: [facility];  Service: Endoscopy;  Laterality: N/A;  . ELECTROPHYSIOLOGIC STUDY N/A 04/21/2015   Procedure: Atrial Fibrillation Ablation;  Surgeon: Thompson Grayer, MD;  Location: [facility];  Service: Cardiovascular;  Laterality: N/A;  . ESOPHAGOGASTRODUODENOSCOPY (EGD) WITH PROPOFOL N/A 11/07/2019   Procedure: ESOPHAGOGASTRODUODENOSCOPY (EGD) WITH PROPOFOL;  Surgeon: Milus Banister, MD;  Location: [facility];  Service: Endoscopy;  Laterality: N/A;  . HERNIA REPAIR    . KNEE SURGERY  6-67yrs ago   left  . LEFT HEART CATH AND CORONARY ANGIOGRAPHY N/A 09/19/2016   Procedure: Left Heart Cath and Coronary Angiography;  Surgeon: Belva Crome, MD;  Location: [place];  Service: Cardiovascular;  Laterality: N/A;  . Left inguinal hernia repair     as a child  . NASAL SEPTOPLASTY W/ TURBINOPLASTY Bilateral 02/19/2014   Procedure: NASAL SEPTOPLASTY WITH BILATERAL TURBINATE REDUCTION;  Surgeon:  Jodi Marble, MD;  Location: [facility];  Service: ENT;  Laterality: Bilateral;  . NECK SURGERY  17yrs ago  . right shoulder surgery  4-61yrs ago   cyst removed  . TEE WITHOUT CARDIOVERSION  05/05/2011   Procedure: TRANSESOPHAGEAL ECHOCARDIOGRAM (TEE);  Surgeon: Lelon Perla, MD;  Location[facility] ENDOSCOPY;  Service: Cardiovascular;  Laterality: N/A;  . TEE WITHOUT CARDIOVERSION N/A 04/21/2015   Procedure: TRANSESOPHAGEAL ECHOCARDIOGRAM (TEE);  Surgeon: Sueanne Margarita, MD;  Location: [facility] ENDOSCOPY;  Service: Cardiovascular;  Laterality: N/A;  . THROAT SURGERY  4-31yrs ago   "thought " it was cancer but came back not  . TONSILLECTOMY      Social History   Socioeconomic History  . Marital status: Married    Spouse name: Not on file  . Number of children: 2  . Years of education: Not on file  . Highest education level: Not on file  Occupational History  . Occupation: Dealer   Tobacco Use  . Smoking status: Current Every Day Smoker    Packs/day: 1.00    Years: 49.00    Pack years: 49.00    Types: Cigarettes  . Smokeless tobacco: Never Used  . Tobacco comment: He is going to try 1-800-QUIT Now for nicotine replacement therapy  Vaping Use  . Vaping Use: Never used  Substance and Sexual Activity  . Alcohol use: Not Currently    Alcohol/week: 0.0 standard drinks    Comment: 12/05/17 pt stated he has drinked 1/2 drink in 3 years  . Drug use: No  . Sexual activity: Yes  Other Topics Concern  . Not on file  Social History Narrative   Daily caffeine(Mountain Dew)       Lives in [place] with spouse.   Unemployed due to chronic back/ leg pain         Social Determinants of Health   Financial Resource Strain:   . Difficulty of Paying Living Expenses:   Food Insecurity:   . Worried About Charity fundraiser in the Last Year:   . Arboriculturist in the Last Year:   Transportation Needs:   . Film/video editor (Medical):   [name] Lack of Transportation (Non-Medical):     Physical Activity:   . Days of Exercise per Week:   . Minutes of Exercise per Session:   Stress:   . Feeling of Stress :   Social Connections:   . Frequency of Communication with Friends and Family:   . Frequency of Social Gatherings with Friends and Family:   . Attends Religious Services:   . Active Member of Clubs or Organizations:   . Attends Archivist Meetings:   [name] Marital Status:   Intimate Partner Violence:   . Fear of Current or Ex-Partner:   . Emotionally Abused:   [name] Physically Abused:   . Sexually Abused:     Current Outpatient Medications on File Prior to Visit  Medication Sig Dispense Refill  .  Insulin Pen Needle (PEN NEEDLES) 30G X 8 MM MISC 1 each by Does not apply route 5 (five) times daily. E11.9 200 each 1  . albuterol (PROAIR HFA) 108 (90 Base) MCG/ACT inhaler Inhale 1-2 puffs into the lungs every 6 (six) hours as needed for wheezing or shortness of breath. 1 Inhaler 5  . Ascorbic Acid (VITAMIN C PO) Take 1,000 mg by mouth daily.     [name] b complex vitamins tablet Take 1 tablet by mouth daily.     . B-D UF III MINI PEN NEEDLES 31G X 5 MM MISC     . bisoprolol (ZEBETA) 10 MG tablet Take 10 mg by mouth daily.     [name] diltiazem (TIAZAC) 120 MG 24 hr capsule Take 1 capsule by mouth daily.     . ferrous sulfate 324 MG TBEC Take 324 mg by mouth daily.     [name] gabapentin (NEURONTIN) 100 MG capsule Take 100-200 mg by mouth See admin instructions. Take 100 mg in the morning and 200 mg at bedtime    . HYDROcodone-acetaminophen (NORCO) 7.5-325 MG tablet Take 1 tablet by mouth 4 (four) times daily.     [name] levocetirizine (XYZAL) 5 MG tablet Take 5 mg by mouth every evening.     [name] losartan (COZAAR) 50 MG tablet Take 1 tablet (50 mg total) by mouth daily. 90 tablet 3  . metolazone (ZAROXOLYN) 2.5 MG tablet TAKE 1 TABLET ON Tuesday AND Thursday 30 MINUTES BEFORE TAKING TORSEMIDE OR AS DIRECTED 15 tablet 3  . Multiple Vitamin (MULTIVITAMIN WITH MINERALS) TABS Take 1 tablet by  mouth daily.     . Multiple Vitamins-Minerals (ZINC PO) Take 50 mg by mouth daily.     . nitroGLYCERIN (NITROSTAT) 0.4 MG SL tablet Place 1 tablet (0.4 mg total) under the tongue every 5 (five) minutes x 3 doses as needed for chest pain. 25 tablet 3  . omeprazole (PRILOSEC) 40 MG capsule Take 1 capsule (40 mg total) by mouth 2 (two) times daily before a meal. 60 capsule 3  . pantoprazole (PROTONIX) 20 MG tablet Take 20 mg by mouth 2 (two) times daily.     . potassium chloride SA (KLOR-CON M20) 20 MEQ tablet Take 1 tablet (20 mEq total) by mouth daily. TAKE AN EXTRA TABLET ON Tuesday AND Thursday 40 tablet 3  . pravastatin (PRAVACHOL) 80 MG tablet TAKE 1 TABLET BY MOUTH DAILY AT BEDTIME 30 tablet 10  . sildenafil (REVATIO) 20 MG tablet Take 20-80 mg by mouth daily as needed (erectile dysfunction).     . torsemide (DEMADEX) 20 MG tablet Take 40 mg by mouth 2 (two) times daily.  180 tablet 3  . umeclidinium-vilanterol (ANORO ELLIPTA) 62.5-25 MCG/INH AEPB Inhale 1 puff into the lungs daily. 1 each 3  . VITAMIN A PO Take 2,400 mcg by mouth daily.     . Vitamin D, Ergocalciferol, (DRISDOL) 1.25 MG (50000 UT) CAPS capsule Take 50,000 Units by mouth every 7 (seven) days.     [name] warfarin (COUMADIN) 5 MG tablet Take 2.5-5 mg by mouth See admin instructions. Tale 2.5 mg on Monday Take 5 mg every other day     No current facility-administered medications on file prior to visit.    Allergies  Allergen Reactions  . Adhesive [Tape]     itching  . Latex Itching    When tape is on the skin too long skin gets red & itching    Family History  Problem Relation Age of Onset  .  Diabetes Mother   . Heart disease Father   . Lung cancer Father   . Diabetes Maternal Grandmother   . Colon cancer Neg Hx   . Anesthesia problems Neg Hx   . Hypotension Neg Hx   . Malignant hyperthermia Neg Hx   . Pseudochol deficiency Neg Hx   . Esophageal cancer Neg Hx   . Pancreatic cancer Neg Hx   . Stomach cancer Neg Hx       BP (!) 150/66 (BP Location: Left Arm, Patient Position: Sitting)   Pulse 74   Ht 6\' 3"  (1.905 m)   Wt 265 lb 6.4 oz (120.4 kg)   SpO2 96%   BMI 33.17 kg/m   Review of Systems Denies fever.     Objective:   Physical Exam VITAL SIGNS:  See vs page GENERAL: no distress SKIN: mild urticaria on the trunk of the body.        Assessment & Plan:  Insulin-requiring type 2 DM, with stage 3 CRI: poor glycemic control Rash, due to Levemir, new.   Patient Instructions  I have sent a prescription to your pharmacy, to start "Toujeo."  This will be your only diabetes medication (you will not take Levemir, Victoza, metformin, or Novolog). check your blood sugar twice a day.  vary the time of day when you check, between before the 3 meals, and at bedtime.  also check if you have symptoms of your blood sugar being too high or too low.  please keep a record of the readings and bring it to your next appointment here (or you can bring the meter itself).  You can write it on any piece of paper.  please call us sooner if your blood sugar goes below 70, or if you have a lot of readings over 200. Please come back for a follow-up appointment on 01/23/20.

## 2019-11-26 NOTE — Patient Instructions (Addendum)
I have sent a prescription to your pharmacy, to start "Toujeo."  This will be your only diabetes medication (you will not take Levemir, Victoza, metformin, or Novolog). check your blood sugar twice a day.  vary the time of day when you check, between before the 3 meals, and at bedtime.  also check if you have symptoms of your blood sugar being too high or too low.  please keep a record of the readings and bring it to your next appointment here (or you can bring the meter itself).  You can write it on any piece of paper.  please call us sooner if your blood sugar goes below 70, or if you have a lot of readings over 200. Please come back for a follow-up appointment on 01/23/20.

## 2019-11-28 ENCOUNTER — Telehealth: Payer: Self-pay | Admitting: Endocrinology

## 2019-11-28 NOTE — Telephone Encounter (Signed)
Patient states he started new insulin this morning and his sugar was 278 this morning and now its gone up to 415. Please advise. Ph# [phone]

## 2019-11-29 ENCOUNTER — Telehealth: Payer: Self-pay | Admitting: Gastroenterology

## 2019-11-29 NOTE — Telephone Encounter (Signed)
Patient called again following up on his call from yesterday.

## 2019-11-29 NOTE — Telephone Encounter (Signed)
Patient states he took 220 units last night and blood sugar this morning 300. Patient also states he is starting to have same allergic reaction to new insulin, SOB and rash on leg.

## 2019-11-29 NOTE — Telephone Encounter (Signed)
The pt called again to report continued abd pain.  He has an appt on 8/23 with Dr Ardis Hughs and no sooner appts are available.  The pt was advised to call PCP, go to urgent care or ED for eval if his pain is worse or he does not think he can wait for the appt.  The pt has been advised of the information and verbalized understanding.

## 2019-11-29 NOTE — Telephone Encounter (Signed)
Could you please notify pt of this?

## 2019-11-29 NOTE — Telephone Encounter (Signed)
Patient states that he will try again in the morning buy if does it again in the morning he is stopping the medication.  He states that the week in between changing the meds he felt the best he has in over a year.   He wanted me to put this in the note cause he does not believe that it is coincidence that this is happing again.   He states he feels like he is allergic to this medication also.

## 2019-11-29 NOTE — Telephone Encounter (Signed)
Take an extra 40 units now Tomorrow AM, start 200 units qam

## 2019-11-29 NOTE — Telephone Encounter (Signed)
The symptoms must be a coincidence. Please take the 40 units now, and tomorrow am, start 250 units qam. Please call or message [place] next week, to tell us how the blood sugar is doing

## 2019-12-02 ENCOUNTER — Ambulatory Visit (INDEPENDENT_AMBULATORY_CARE_PROVIDER_SITE_OTHER)
Admission: RE | Admit: 2019-12-02 | Discharge: 2019-12-02 | Disposition: A | Discharge status: Home / Self Care | Payer: 59 | Source: Ambulatory Visit | Attending: Adult Health | Admitting: Adult Health

## 2019-12-02 ENCOUNTER — Other Ambulatory Visit: Payer: Self-pay

## 2019-12-02 ENCOUNTER — Ambulatory Visit (INDEPENDENT_AMBULATORY_CARE_PROVIDER_SITE_OTHER): Payer: 59 | Admitting: Adult Health

## 2019-12-02 ENCOUNTER — Encounter: Payer: Self-pay | Admitting: Adult Health

## 2019-12-02 ENCOUNTER — Telehealth: Payer: Self-pay | Admitting: Pulmonary Disease

## 2019-12-02 VITALS — BP 140/64 | HR 63 | Temp 98.0°F | Ht 75.0 in | Wt 259.6 lb

## 2019-12-02 DIAGNOSIS — J449 Chronic obstructive pulmonary disease, unspecified: Secondary | ICD-10-CM

## 2019-12-02 DIAGNOSIS — Z720 Tobacco use: Secondary | ICD-10-CM | POA: Diagnosis not present

## 2019-12-02 MED ORDER — NOVOLOG FLEXPEN 100 UNIT/ML ~~LOC~~ SOPN: 80.0000 [IU] | PEN_INJECTOR | Freq: Two times a day (BID) | SUBCUTANEOUS | 11 refills | Status: DC

## 2019-12-02 NOTE — Telephone Encounter (Signed)
OK, I have sent a prescription to your pharmacy, to increase to 80 units breakfast and supper.

## 2019-12-02 NOTE — Assessment & Plan Note (Signed)
Severe COPD.  No symptoms of acute exacerbation however continues to have high symptom burden.  Check chest x-ray today. Does not appear to be in volume overload.  Patient is continue with his current regimen and follow-up with cardiology as planned  Plan  Patient Instructions  Chest xray today .  Working on not smoking .  Continue ANORO 1 puff daily. Rinse after use.  Activity as tolerated.  Work on healthy weight loss.  Do not drive if sleepy Follow up with Dr. Elsworth Soho  In 3-4- months and As needed   Please contact office for sooner follow up if symptoms do not improve or worsen or seek emergency care

## 2019-12-02 NOTE — Patient Instructions (Addendum)
Chest xray today .  Working on not smoking .  Continue ANORO 1 puff daily. Rinse after use.  Activity as tolerated.  Work on healthy weight loss.  Do not drive if sleepy Follow up with Dr. Elsworth Soho  In 3-4- months and As needed   Please contact office for sooner follow up if symptoms do not improve or worsen or seek emergency care

## 2019-12-02 NOTE — Telephone Encounter (Signed)
Called and spoke with pt who stated he began to have complaints of SOB x2 weeks ago. Pt stated Dr. Loanne Drilling upped the dose of his insulin and when that happened, pt began to have increased SOB and also developed a rash which he states was due to the insulin. Pt stated he contacted Dr. Loanne Drilling about the symptoms and then he switched pt to [facility]. Pt stated that he was still having problems of SOB with that insulin so he had to take himself off of that insulin.  Pt stated that Dr. Loanne Drilling told him that he does not believe the SOB is coming from the insulin. Pt is due for a f/u at the office so I have scheduled pt for an OV today at 1:30 with TP.nothing further needed.

## 2019-12-02 NOTE — Telephone Encounter (Signed)
We can change to another qd insulin "Tyler Aas."  Please let me know

## 2019-12-02 NOTE — Telephone Encounter (Signed)
Called pt and gave him MD message. Pt stated that he will not take a long acting insulin because he has taken two different long acting insulins and he has had reactions to both of them.  Pt is taking Insulin Aspart, 60 units TID, and it seems like it is not enough per patient opinion. Also stated that he needs a new Rx for this. Patient also started taking Metformin again.

## 2019-12-02 NOTE — Telephone Encounter (Signed)
Patient called stating he took [place] last Thursday and Friday and he said he's had to stop taking it due to it interfering with his COPD and his breathing started getting really bad last week after starting it. He said he had some Novolog left over and he is taking this with each meal until he can figure out something. He said he's also been taking the metformin to keep his sugar down. Patient ph# [phone]

## 2019-12-02 NOTE — Addendum Note (Signed)
Addended by: Renato Shin on: 12/02/2019 04:34 PM   Modules accepted: Orders

## 2019-12-02 NOTE — Assessment & Plan Note (Signed)
Smoking cessation discussed 

## 2019-12-02 NOTE — Progress Notes (Signed)
@Patient  ID: [name], male    DOB: Apr 24, 1956, 64 y.o.   MRN: [id number]  Chief Complaint  Patient presents with  . Follow-up    COPD     Referring provider: Timoteo Gaul, FNP  HPI: 64 year old male active smoker followed for COPD and obstructive sleep apnea Medical history significant for A. fib status post ablation in 2016 Has obstructive sleep apnea that is CPAP intolerant  TEST/EVENTS :  Pulmonary function testing Sep 16, 2015 showed moderate COPD with an FEV1 at 65%, ratio 68, FVC 73%, positive bronchodilator response.  Echo 07/2019 - EF 60-65%, Grade 2 DD.   12/02/2019 Follow up : COPD  Patient presents for a 69-month follow-up.  Patient has known underlying COPD.  He continues to smoke.  Smoking cessation discussed.  Patient has been referred to the low-dose CT screening program.  Patient had a low-dose CT chest in this showed lung RADS 1 with no suspicious findings.  Mild emphysema.  Patient says overall his breathing has not been doing as well, today feels better with breathing . Did have trouble when on Levemir and Toujeo.  Patient feels that he was having allergic reaction to these medications once he stopped these have started to see a substantial improvement in the way that he feels. He remains on Anoro daily.    Patient says once he started Levemir he instantly started feeling better.  He was switched from Levemir to [facility], had bad reaction . Stopped 3 days ago. Starting to feel better.  Denies increased cough or wheezing. Has daily cough.  Covid vaccine utd.    Allergies  Allergen Reactions  . Adhesive [Tape]     itching  . Latex Itching    When tape is on the skin too long skin gets red & itching    Immunization History  Administered Date(s) Administered  . Influenza Split 01/30/2011, 03/05/2012, 02/13/2013, 02/03/2016, 02/20/2019  . Influenza Whole 02/13/2009  . Influenza,inj,Quad PF,6+ Mos 03/14/2014, 04/22/2015, 01/19/2016, 02/02/2017,  02/05/2019  . Influenza-Unspecified 01/30/2011, 03/05/2012, 02/13/2013, 03/14/2014, 04/22/2015, 01/19/2016, 02/03/2016, 02/02/2017, 02/13/2018  . Pneumococcal Polysaccharide-23 02/14/2007    Past Medical History:  Diagnosis Date  . Arthritis    lower back  . Asthma   . Atrial flutter ([place])    s/p CTI ablation by Dr Rayann Heman  . Brain aneurysm 2009   questionable. A follow up CTA in 2009 showed no evidence of  . Chronic back pain    DDD/stenosis  . Colon polyps    9 polyps removed 10/13/11  . Complication of anesthesia September 11, 2012   slow to awaken after ablation  . COPD (chronic obstructive pulmonary disease) ([place])   . Diabetes mellitus    takes Metformin and Glimepiride daily  . Emphysema   . GERD (gastroesophageal reflux disease)    takes Omeprazole bid  . History of shingles   . HLD (hyperlipidemia)    takes Pravastatin daily  . HTN (hypertension)    takes Prinizide daily  . Obesity   . OSA (obstructive sleep apnea)    not always using cpap  . Overdose 2009   unintentional Flecanide overdose  . Peripheral neuropathy   . Short-term memory loss   . Tobacco abuse     Tobacco History: Social History   Tobacco Use  Smoking Status Current Every Day Smoker  . Packs/day: 1.00  . Years: 49.00  . Pack years: 49.00  . Types: Cigarettes  Smokeless Tobacco Never Used  Tobacco Comment  1 pack per day 12/02/19 ARJ    Ready to quit: No Counseling given: Yes Comment: 1 pack per day 12/02/19 ARJ    Outpatient Medications Prior to Visit  Medication Sig Dispense Refill  . albuterol (PROAIR HFA) 108 (90 Base) MCG/ACT inhaler Inhale 1-2 puffs into the lungs every 6 (six) hours as needed for wheezing or shortness of breath. 1 Inhaler 5  . Ascorbic Acid (VITAMIN C PO) Take 1,000 mg by mouth daily.     [name] b complex vitamins tablet Take 1 tablet by mouth daily.     . B-D UF III MINI PEN NEEDLES 31G X 5 MM MISC     . bisoprolol (ZEBETA) 10 MG tablet Take 10 mg by mouth daily.       [name] diltiazem (TIAZAC) 120 MG 24 hr capsule Take 1 capsule by mouth daily.     . ferrous sulfate 324 MG TBEC Take 324 mg by mouth daily.     [name] gabapentin (NEURONTIN) 100 MG capsule Take 100-200 mg by mouth See admin instructions. Take 100 mg in the morning and 200 mg at bedtime    . HYDROcodone-acetaminophen (NORCO) 7.5-325 MG tablet Take 1 tablet by mouth 4 (four) times daily.     . insulin glargine, 2 Unit Dial, (TOUJEO MAX SOLOSTAR) 300 UNIT/ML Solostar Pen Inject 160 Units into the skin every morning. 6 pen 11  . Insulin Pen Needle (PEN NEEDLES) 30G X 8 MM MISC 1 each by Does not apply route 5 (five) times daily. E11.9 200 each 1  . levocetirizine (XYZAL) 5 MG tablet Take 5 mg by mouth every evening.     [name] losartan (COZAAR) 50 MG tablet Take 1 tablet (50 mg total) by mouth daily. 90 tablet 3  . metolazone (ZAROXOLYN) 2.5 MG tablet TAKE 1 TABLET ON Tuesday AND Thursday 30 MINUTES BEFORE TAKING TORSEMIDE OR AS DIRECTED 15 tablet 3  . Multiple Vitamin (MULTIVITAMIN WITH MINERALS) TABS Take 1 tablet by mouth daily.     . Multiple Vitamins-Minerals (ZINC PO) Take 50 mg by mouth daily.     . nitroGLYCERIN (NITROSTAT) 0.4 MG SL tablet Place 1 tablet (0.4 mg total) under the tongue every 5 (five) minutes x 3 doses as needed for chest pain. 25 tablet 3  . omeprazole (PRILOSEC) 40 MG capsule Take 1 capsule (40 mg total) by mouth 2 (two) times daily before a meal. 60 capsule 3  . pantoprazole (PROTONIX) 20 MG tablet Take 20 mg by mouth 2 (two) times daily.     . potassium chloride SA (KLOR-CON M20) 20 MEQ tablet Take 1 tablet (20 mEq total) by mouth daily. TAKE AN EXTRA TABLET ON Tuesday AND Thursday 40 tablet 3  . pravastatin (PRAVACHOL) 80 MG tablet TAKE 1 TABLET BY MOUTH DAILY AT BEDTIME 30 tablet 10  . sildenafil (REVATIO) 20 MG tablet Take 20-80 mg by mouth daily as needed (erectile dysfunction).     . torsemide (DEMADEX) 20 MG tablet Take 40 mg by mouth 2 (two) times daily.  180 tablet 3  .  umeclidinium-vilanterol (ANORO ELLIPTA) 62.5-25 MCG/INH AEPB Inhale 1 puff into the lungs daily. 1 each 3  . VITAMIN A PO Take 2,400 mcg by mouth daily.     . Vitamin D, Ergocalciferol, (DRISDOL) 1.25 MG (50000 UT) CAPS capsule Take 50,000 Units by mouth every 7 (seven) days.     [name] warfarin (COUMADIN) 5 MG tablet Take 2.5-5 mg by mouth See admin instructions. Tale 2.5 mg on Monday Take  5 mg every other day     No facility-administered medications prior to visit.     Review of Systems:   Constitutional:   No  weight loss, night sweats,  Fevers, chills,  +fatigue, or  lassitude.  HEENT:   No headaches,  Difficulty swallowing,  Tooth/dental problems, or  Sore throat,                No sneezing, itching, ear ache, +nasal congestion, post nasal drip,   CV:  No chest pain,  Orthopnea, PND, swelling in lower extremities, anasarca, dizziness, palpitations, syncope.   GI  No heartburn, indigestion, abdominal pain, nausea, vomiting, diarrhea, change in bowel habits, loss of appetite, bloody stools.   Resp:    No chest wall deformity  Skin: no rash or lesions.  GU: no dysuria, change in color of urine, no urgency or frequency.  No flank pain, no hematuria   MS:  No joint pain or swelling.  No decreased range of motion.  No back pain.    Physical Exam  BP 140/64 (BP Location: Left Arm, Cuff Size: Normal)   Pulse 63   Temp 98 F (36.7 C) (Oral)   Ht 6\' 3"  (1.905 m)   Wt 259 lb 9.6 oz (117.8 kg)   SpO2 97%   BMI 32.45 kg/m   GEN: A/Ox3; pleasant , NAD, BMI 32   HEENT:  [place]/AT,    NOSE-clear, THROAT-clear, no lesions, no postnasal drip or exudate noted.   NECK:  Supple w/ fair ROM; no JVD; normal carotid impulses w/o bruits; no thyromegaly or nodules palpated; no lymphadenopathy.    RESP  Clear  P & A; w/o, wheezes/ rales/ or rhonchi. no accessory muscle use, no dullness to percussion  CARD:  RRR, no m/r/g, tr   peripheral edema, pulses intact, no cyanosis or clubbing.  GI:    Soft & nt; nml bowel sounds; no organomegaly or masses detected.   Musco: Warm bil, no deformities or joint swelling noted.   Neuro: alert, no focal deficits noted.    Skin: Warm, no lesions or rashes    Lab Results:  CBC    Component Value Date/Time   WBC 5.9 10/18/2019 1159   WBC 6.8 12/05/2017 0949   RBC 3.86 (L) 10/18/2019 1159   RBC 4.34 12/05/2017 0949   HGB 11.7 (L) 10/18/2019 1159   HCT 34.5 (L) 10/18/2019 1159   PLT 134 (L) 10/18/2019 1159   MCV 89 10/18/2019 1159   MCH 30.3 10/18/2019 1159   MCH 28.6 09/14/2016 0858   MCHC 33.9 10/18/2019 1159   MCHC 34.4 12/05/2017 0949   RDW 15.8 (H) 10/18/2019 1159   LYMPHSABS 1.2 09/24/2019 1449   MONOABS 0.5 12/05/2017 0949   EOSABS 0.1 09/24/2019 1449   BASOSABS 0.0 09/24/2019 1449    BMET    Component Value Date/Time   NA 142 10/18/2019 0935   K 3.5 10/18/2019 0935   CL 99 10/18/2019 0935   CO2 24 10/18/2019 0935   GLUCOSE 258 (H) 10/18/2019 0935   GLUCOSE 247 (H) 12/05/2017 0949   BUN 26 10/18/2019 0935   CREATININE 1.21 10/18/2019 0935   CREATININE 0.69 (L) 09/14/2016 0858   CALCIUM 8.7 10/18/2019 0935   GFRNONAA 64 10/18/2019 0935   GFRAA 74 10/18/2019 0935    BNP    Component Value Date/Time   BNP 61.2 09/16/2019 1544   BNP 46.1 03/06/2016 2257   BNP 143.6 (H) 04/28/2015 1055    ProBNP  Component Value Date/Time   PROBNP 50.1 10/10/2013 1945    Imaging: No results found.    PFT Results Latest Ref Rng & Units 09/16/2015  FVC-Pre L 3.57  FVC-Predicted Pre % 64  FVC-Post L 4.09  FVC-Predicted Post % 73  Pre FEV1/FVC % % 67  Post FEV1/FCV % % 68  FEV1-Pre L 2.39  FEV1-Predicted Pre % 56  FEV1-Post L 2.79    No results found for: NITRICOXIDE      Assessment & Plan:   COPD (chronic obstructive pulmonary disease) (HCC) Severe COPD.  No symptoms of acute exacerbation however continues to have high symptom burden.  Check chest x-ray today. Does not appear to be in volume overload.   Patient is continue with his current regimen and follow-up with cardiology as planned  Plan  Patient Instructions  Chest xray today .  Working on not smoking .  Continue ANORO 1 puff daily. Rinse after use.  Activity as tolerated.  Work on healthy weight loss.  Do not drive if sleepy Follow up with Dr. Elsworth Soho  In 3-4- months and As needed   Please contact office for sooner follow up if symptoms do not improve or worsen or seek emergency care       Tobacco abuse Smoking cessation discussed     Rexene Edison, NP 12/02/2019

## 2019-12-03 ENCOUNTER — Telehealth: Payer: Self-pay | Admitting: Cardiology

## 2019-12-03 ENCOUNTER — Telehealth: Payer: Self-pay | Admitting: Adult Health

## 2019-12-03 MED ORDER — ANORO ELLIPTA 62.5-25 MCG/INH IN AEPB: 1.0000 | INHALATION_SPRAY | Freq: Every day | RESPIRATORY_TRACT | 5 refills | Status: DC

## 2019-12-03 MED ORDER — ALBUTEROL SULFATE HFA 108 (90 BASE) MCG/ACT IN AERS: 1.0000 | INHALATION_SPRAY | Freq: Four times a day (QID) | RESPIRATORY_TRACT | 5 refills | Status: DC | PRN

## 2019-12-03 NOTE — Telephone Encounter (Signed)
Patient called and refills for albuterol and anoro sent to preferred pharmacy.

## 2019-12-03 NOTE — Telephone Encounter (Signed)
Patient called in- he states that he was allergic to his insulin, and now that they changed his medications and he feels much better. He is not having the issues he was having when he seen Dr.Hochrein-  He is now questioning if he needs to continue with the MRI, as he does not want to have another test if he does not need it and he is feeling better.   He also states that he has no swelling, and the medication increase of the torsemide is causing him to be dehydrated. He would like to try to come off of this if possible or decrease dose down to see how he does. I advised that Dr.Hochrein was out of town this week, but another provider would get back with [place] on these questions. Will route to nurses of the doctors are covering for him this week.  Patient verbalized understanding.

## 2019-12-03 NOTE — Telephone Encounter (Signed)
Patient states he has an MRI scheduled 12/16/2019. He states he was getting the MRI, because he was feeling very weak. He states he feels better now and found out he is allergic to one of his insulins and that is why he felt weak. He would like to know if he still needs to have the MRI done.

## 2019-12-05 ENCOUNTER — Telehealth: Payer: Self-pay | Admitting: Endocrinology

## 2019-12-05 NOTE — Telephone Encounter (Signed)
Based on Dr. Rosezella Florida last note, cardiac MRI is still indicated and he should keep that appointment.   If he feels very dry, may be reasonable to decrease his torsemide to 20 mg twice daily (currently taking 40 mg twice daily per chart review). Please advise if he has 3-5 lb weight gain, swelling or SOB increase, he should go back to his previous dose of torsemide 40 mg twice daily.

## 2019-12-05 NOTE — Telephone Encounter (Signed)
Medication Refill Request  Did you call your pharmacy and request this refill first? Yes  . If patient has not contacted pharmacy first, instruct them to do so for future refills.  . Remind them that contacting the pharmacy for their refill is the quickest method to get the refill.  . Refill policy also stated that it will take anywhere between 24-72 hours to receive the refill.    Name of medication? B-D UF III MINI PEN NEEDLES 31G X 5 MM MISC   Is this a 90 day supply?   Name and location of pharmacy?  [facility] - [place] [place] [address]  Phone:  [phone] Fax:  [phone]

## 2019-12-05 NOTE — Telephone Encounter (Signed)
Spoke with pt, aware of the recommendations.  

## 2019-12-06 ENCOUNTER — Other Ambulatory Visit: Payer: Self-pay

## 2019-12-06 MED ORDER — BD PEN NEEDLE MINI U/F 31G X 5 MM MISC: 5 refills | Status: DC

## 2019-12-06 NOTE — Telephone Encounter (Signed)
Rx sent 

## 2019-12-10 ENCOUNTER — Ambulatory Visit (INDEPENDENT_AMBULATORY_CARE_PROVIDER_SITE_OTHER): Payer: 59 | Admitting: Endocrinology

## 2019-12-10 ENCOUNTER — Other Ambulatory Visit: Payer: Self-pay

## 2019-12-10 ENCOUNTER — Encounter: Payer: Self-pay | Admitting: Endocrinology

## 2019-12-10 VITALS — BP 138/68 | HR 71 | Ht 75.0 in | Wt 267.8 lb

## 2019-12-10 DIAGNOSIS — E119 Type 2 diabetes mellitus without complications: Secondary | ICD-10-CM

## 2019-12-10 DIAGNOSIS — Z794 Long term (current) use of insulin: Secondary | ICD-10-CM

## 2019-12-10 MED ORDER — METFORMIN HCL ER 500 MG PO TB24
500.0000 mg | ORAL_TABLET | Freq: Every day | ORAL | 3 refills | Status: DC
Start: 2019-12-10 — End: 2020-12-22

## 2019-12-10 MED ORDER — NOVOLOG FLEXPEN 100 UNIT/ML ~~LOC~~ SOPN: 65.0000 [IU] | PEN_INJECTOR | Freq: Three times a day (TID) | SUBCUTANEOUS | 11 refills | Status: DC

## 2019-12-10 MED ORDER — BD PEN NEEDLE MINI U/F 31G X 5 MM MISC: 1.0000 | Freq: Three times a day (TID) | 3 refills | Status: DC

## 2019-12-10 NOTE — Progress Notes (Signed)
Subjective:    Patient ID: [name], male    DOB: [birth year], 64 y.o.   MRN: [id number]  HPI Pt returns for f/u of diabetes mellitus:  DM type: Insulin-requiring type 2 Dx'ed: 5885 Complications: stage 3 CRI Therapy: insulin since 2019.   DKA: never Severe hypoglycemia: never Pancreatitis: never Pancreatic imaging: normal on 2017 CT.  SDOH: he declines insulins other than Novolog Other: he takes multiple daily injections; he did not tolerate Jardiance (dehydration) or time-release insulins (rash); metformin dosage is limited by abd pain; Interval history: Pt says he takes Novolog 60 units 3 times a day (just before each meal).  no cbg record, but states cbg's vary from 128-300.  It is in general highest fasting.  He says cbg averages 240.  pt states he feels better in general recently.   Past Medical History:  Diagnosis Date  . Arthritis    lower back  . Asthma   . Atrial flutter ([place])    s/p CTI ablation by Dr Rayann Heman  . Brain aneurysm 2009   questionable. A follow up CTA in 2009 showed no evidence of  . Chronic back pain    DDD/stenosis  . Colon polyps    9 polyps removed 10/13/11  . Complication of anesthesia September 11, 2012   slow to awaken after ablation  . COPD (chronic obstructive pulmonary disease) ([place])   . Diabetes mellitus    takes Metformin and Glimepiride daily  . Emphysema   . GERD (gastroesophageal reflux disease)    takes Omeprazole bid  . History of shingles   . HLD (hyperlipidemia)    takes Pravastatin daily  . HTN (hypertension)    takes Prinizide daily  . Obesity   . OSA (obstructive sleep apnea)    not always using cpap  . Overdose 2009   unintentional Flecanide overdose  . Peripheral neuropathy   . Short-term memory loss   . Tobacco abuse     Past Surgical History:  Procedure Laterality Date  . ANTERIOR CERVICAL DECOMP/DISCECTOMY FUSION  01/04/2012   Procedure: ANTERIOR CERVICAL DECOMPRESSION/DISCECTOMY FUSION 1 LEVEL/HARDWARE REMOVAL;   Surgeon: Ophelia Charter, MD;  Location: [facility];  Service: Neurosurgery;  Laterality: N/A;  explore cervical fusion Cervical six - seven  with removal of codman plate anterior cervical decompression with fusion interbody prothesis plating and bonegraft  . APPENDECTOMY  2-83yrs ago  . ATRIAL FIBRILLATION ABLATION N/A 09/11/2012   PT DID NOT HAVE AN ATRIAL FIBRILLATION ABLATION IN 2014!  ATRIAL FLUTTER ABLATION ONLY  . ATRIAL FLUTTER ABLATION  09/11/2012   CTI ablation by Dr Rayann Heman  . BIOPSY  11/07/2019   Procedure: BIOPSY;  Surgeon: Milus Banister, MD;  Location: [facility] ENDOSCOPY;  Service: Endoscopy;;  . CARDIAC CATHETERIZATION  2008   no significant CAD  . CARDIOVERSION  05/05/2011   Procedure: CARDIOVERSION;  Surgeon: Lelon Perla, MD;  Location[facility];  Service: Cardiovascular;  Laterality: N/A;  . CARDIOVERSION Bilateral 07/26/2012   Procedure: CARDIOVERSION;  Surgeon: Minus Breeding, MD;  Location[facility] ENDOSCOPY;  Service: Cardiovascular;  Laterality: Bilateral;  . CARPAL TUNNEL RELEASE  99/2000   bilateral  . COLONOSCOPY WITH PROPOFOL N/A 12/13/2012   Procedure: COLONOSCOPY WITH PROPOFOL;  Surgeon: Milus Banister, MD;  Location: [facility] ENDOSCOPY;  Service: Endoscopy;  Laterality: N/A;  . ELECTROPHYSIOLOGIC STUDY N/A 04/21/2015   Procedure: Atrial Fibrillation Ablation;  Surgeon: Thompson Grayer, MD;  Location: [place] CV LAB;  Service: Cardiovascular;  Laterality: N/A;  .  ESOPHAGOGASTRODUODENOSCOPY (EGD) WITH PROPOFOL N/A 11/07/2019   Procedure: ESOPHAGOGASTRODUODENOSCOPY (EGD) WITH PROPOFOL;  Surgeon: Milus Banister, MD;  Location: [facility] ENDOSCOPY;  Service: Endoscopy;  Laterality: N/A;  . HERNIA REPAIR    . KNEE SURGERY  6-90yrs ago   left  . LEFT HEART CATH AND CORONARY ANGIOGRAPHY N/A 09/19/2016   Procedure: Left Heart Cath and Coronary Angiography;  Surgeon: Belva Crome, MD;  Location: [place] [facility] LAB;  Service: Cardiovascular;  Laterality: N/A;  . Left inguinal hernia  repair     as a child  . NASAL SEPTOPLASTY W/ TURBINOPLASTY Bilateral 02/19/2014   Procedure: NASAL SEPTOPLASTY WITH BILATERAL TURBINATE REDUCTION;  Surgeon: Jodi Marble, MD;  Location: [facility];  Service: ENT;  Laterality: Bilateral;  . NECK SURGERY  58yrs ago  . right shoulder surgery  4-107yrs ago   cyst removed  . TEE WITHOUT CARDIOVERSION  05/05/2011   Procedure: TRANSESOPHAGEAL ECHOCARDIOGRAM (TEE);  Surgeon: Lelon Perla, MD;  Location: [facility] ENDOSCOPY;  Service: Cardiovascular;  Laterality: N/A;  . TEE WITHOUT CARDIOVERSION N/A 04/21/2015   Procedure: TRANSESOPHAGEAL ECHOCARDIOGRAM (TEE);  Surgeon: Sueanne Margarita, MD;  Location: [facility] ENDOSCOPY;  Service: Cardiovascular;  Laterality: N/A;  . THROAT SURGERY  4-16yrs ago   "thought " it was cancer but came back not  . TONSILLECTOMY      Social History   Socioeconomic History  . Marital status: Married    Spouse name: Not on file  . Number of children: 2  . Years of education: Not on file  . Highest education level: Not on file  Occupational History  . Occupation: Dealer   Tobacco Use  . Smoking status: Current Every Day Smoker    Packs/day: 1.00    Years: 49.00    Pack years: 49.00    Types: Cigarettes  . Smokeless tobacco: Never Used  . Tobacco comment: 1 pack per day 12/02/19 ARJ   Vaping Use  . Vaping Use: Never used  Substance and Sexual Activity  . Alcohol use: Not Currently    Alcohol/week: 0.0 standard drinks    Comment: 12/05/17 pt stated he has drinked 1/2 drink in 3 years  . Drug use: No  . Sexual activity: Yes  Other Topics Concern  . Not on file  Social History Narrative   Daily caffeine(Mountain Dew)       Lives in [place] with spouse.   Unemployed due to chronic back/ leg pain         Social Determinants of Health   Financial Resource Strain:   . Difficulty of Paying Living Expenses:   Food Insecurity:   . Worried About Charity fundraiser in the Last Year:   . Arboriculturist in the Last  Year:   Transportation Needs:   . Film/video editor (Medical):   [name] Lack of Transportation (Non-Medical):   Physical Activity:   . Days of Exercise per Week:   . Minutes of Exercise per Session:   Stress:   . Feeling of Stress :   Social Connections:   . Frequency of Communication with Friends and Family:   . Frequency of Social Gatherings with Friends and Family:   . Attends Religious Services:   . Active Member of Clubs or Organizations:   . Attends Archivist Meetings:   [name] Marital Status:   Intimate Partner Violence:   . Fear of Current or Ex-Partner:   . Emotionally Abused:   [name] Physically Abused:   .  Sexually Abused:     Current Outpatient Medications on File Prior to Visit  Medication Sig Dispense Refill  . albuterol (PROAIR HFA) 108 (90 Base) MCG/ACT inhaler Inhale 1-2 puffs into the lungs every 6 (six) hours as needed for wheezing or shortness of breath. 18 g 5  . Ascorbic Acid (VITAMIN C PO) Take 1,000 mg by mouth daily.     [name] b complex vitamins tablet Take 1 tablet by mouth daily.     . bisoprolol (ZEBETA) 10 MG tablet Take 10 mg by mouth daily.     [name] diltiazem (TIAZAC) 120 MG 24 hr capsule Take 1 capsule by mouth daily.     . ferrous sulfate 324 MG TBEC Take 324 mg by mouth daily.     [name] gabapentin (NEURONTIN) 100 MG capsule Take 100-200 mg by mouth See admin instructions. Take 100 mg in the morning and 200 mg at bedtime    . HYDROcodone-acetaminophen (NORCO) 7.5-325 MG tablet Take 1 tablet by mouth 4 (four) times daily.     [name] levocetirizine (XYZAL) 5 MG tablet Take 5 mg by mouth every evening.     [name] losartan (COZAAR) 50 MG tablet Take 1 tablet (50 mg total) by mouth daily. 90 tablet 3  . metolazone (ZAROXOLYN) 2.5 MG tablet TAKE 1 TABLET ON Tuesday AND Thursday 30 MINUTES BEFORE TAKING TORSEMIDE OR AS DIRECTED 15 tablet 3  . Multiple Vitamin (MULTIVITAMIN WITH MINERALS) TABS Take 1 tablet by mouth daily.     . Multiple Vitamins-Minerals (ZINC PO) Take 50  mg by mouth daily.     . nitroGLYCERIN (NITROSTAT) 0.4 MG SL tablet Place 1 tablet (0.4 mg total) under the tongue every 5 (five) minutes x 3 doses as needed for chest pain. 25 tablet 3  . omeprazole (PRILOSEC) 40 MG capsule Take 1 capsule (40 mg total) by mouth 2 (two) times daily before a meal. 60 capsule 3  . pantoprazole (PROTONIX) 20 MG tablet Take 20 mg by mouth 2 (two) times daily.     . potassium chloride SA (KLOR-CON M20) 20 MEQ tablet Take 1 tablet (20 mEq total) by mouth daily. TAKE AN EXTRA TABLET ON Tuesday AND Thursday 40 tablet 3  . pravastatin (PRAVACHOL) 80 MG tablet TAKE 1 TABLET BY MOUTH DAILY AT BEDTIME 30 tablet 10  . sildenafil (REVATIO) 20 MG tablet Take 20-80 mg by mouth daily as needed (erectile dysfunction).     . torsemide (DEMADEX) 20 MG tablet Take 40 mg by mouth 2 (two) times daily.  180 tablet 3  . umeclidinium-vilanterol (ANORO ELLIPTA) 62.5-25 MCG/INH AEPB Inhale 1 puff into the lungs daily. 60 each 5  . VITAMIN A PO Take 2,400 mcg by mouth daily.     . Vitamin D, Ergocalciferol, (DRISDOL) 1.25 MG (50000 UT) CAPS capsule Take 50,000 Units by mouth every 7 (seven) days.     [name] warfarin (COUMADIN) 5 MG tablet Take 2.5-5 mg by mouth See admin instructions. Tale 2.5 mg on Monday Take 5 mg every other day     No current facility-administered medications on file prior to visit.    Allergies  Allergen Reactions  . Adhesive [Tape]     itching  . Latex Itching    When tape is on the skin too long skin gets red & itching    Family History  Problem Relation Age of Onset  . Diabetes Mother   . Heart disease Father   . Lung cancer Father   . Diabetes Maternal Grandmother   .  Colon cancer Neg Hx   . Anesthesia problems Neg Hx   . Hypotension Neg Hx   . Malignant hyperthermia Neg Hx   . Pseudochol deficiency Neg Hx   . Esophageal cancer Neg Hx   . Pancreatic cancer Neg Hx   . Stomach cancer Neg Hx     BP (!) 138/68   Pulse 71   Ht 6\' 3"  (1.905 m)   Wt (!)  267 lb 12.8 oz (121.5 kg)   SpO2 96%   BMI 33.47 kg/m   Review of Systems He denies hypoglycemia.      Objective:   Physical Exam VITAL SIGNS:  See vs page GENERAL: no distress    Lab Results  Component Value Date   CREATININE 1.21 10/18/2019   BUN 26 10/18/2019   NA 142 10/18/2019   K 3.5 10/18/2019   CL 99 10/18/2019   CO2 24 10/18/2019       Assessment & Plan:  Insulin-requiring type 2 DM: he needs increased rx.  CRI: in this setting, he should reduce metformin.  Depression, due to serious illnesses.  This seems improved.  We'll follow.  Patient Instructions  check your blood sugar twice a day.  vary the time of day when you check, between before the 3 meals, and at bedtime.  also check if you have symptoms of your blood sugar being too high or too low.  please keep a record of the readings and bring it to your next appointment here (or you can bring the meter itself).  You can write it on any piece of paper.  please call us sooner if your blood sugar goes below 70, or if you have a lot of readings over 200. Please increase the novolog to 65 units 3 times a day (just before each meal).   Please reduce the metformin to 1 pill per day, and take it at bedtime.   Please come back for a follow-up appointment in 2 months.

## 2019-12-10 NOTE — Patient Instructions (Addendum)
check your blood sugar twice a day.  vary the time of day when you check, between before the 3 meals, and at bedtime.  also check if you have symptoms of your blood sugar being too high or too low.  please keep a record of the readings and bring it to your next appointment here (or you can bring the meter itself).  You can write it on any piece of paper.  please call us sooner if your blood sugar goes below 70, or if you have a lot of readings over 200. Please increase the novolog to 65 units 3 times a day (just before each meal).   Please reduce the metformin to 1 pill per day, and take it at bedtime.   Please come back for a follow-up appointment in 2 months.

## 2019-12-11 LAB — BASIC METABOLIC PANEL
BUN/Creatinine Ratio: 17 (ref 10–24)
BUN: 18 mg/dL (ref 8–27)
CO2: 26 mmol/L (ref 20–29)
Calcium: 9.4 mg/dL (ref 8.6–10.2)
Chloride: 102 mmol/L (ref 96–106)
Creatinine, Ser: 1.04 mg/dL (ref 0.76–1.27)
GFR calc Af Amer: 89 mL/min/{1.73_m2} (ref >59)
GFR calc non Af Amer: 77 mL/min/{1.73_m2} (ref >59)
Glucose: 195 mg/dL — ABNORMAL HIGH (ref 65–99)
Potassium: 4 mmol/L (ref 3.5–5.2)
Sodium: 143 mmol/L (ref 134–144)

## 2019-12-12 ENCOUNTER — Telehealth: Payer: Self-pay | Admitting: Cardiology

## 2019-12-12 NOTE — Telephone Encounter (Signed)
Pt c/o swelling: STAT is pt has developed SOB within 24 hours  1) How much weight have you gained and in what time span? 9 pounds in in ~4 days   2) If swelling, where is the swelling located? Hands and feet  3) Are you currently taking a fluid pill? yes  4) Are you currently SOB? At times yes   5) Do you have a log of your daily weights (if so, list)? no  6) Have you gained 3 pounds in a day or 5 pounds in a week?   7) Have you traveled recently? No  Patient states he started to feel dehydrated so he started to cut back his fluid pill.

## 2019-12-12 NOTE — Telephone Encounter (Signed)
sPOKE WITH PT AND NOTES SWELLING IN HANDS AND FEET ,UP 9 LBS , AND SOB WHEN WALKING AROUND( THE LAST 24 HOURS )PER PT HAS ATE SPAGHETTI 3X THIS WEEK AND HAD A COUPLE OF SLICES OF BACON PT CURRENTLY TAKING TORSEMIDE 40 MG BID  AND METOLAZONE AS NEEDED PER PT DID NOT URINATE MUCH YESTERDAY ENCOURAGED PT TO WATCH SALT INTAKE AND IF S/S WORSEN TO GO TO ED FOR EVAL AND TX WILL FORWARD TO DR [facility] FOR REVIEW AND RECOMMENDATIONS .Adonis Housekeeper

## 2019-12-13 ENCOUNTER — Telehealth (HOSPITAL_COMMUNITY): Payer: Self-pay | Admitting: *Deleted

## 2019-12-13 NOTE — Telephone Encounter (Signed)
Reaching out to patient to offer assistance regarding upcoming cardiac imaging study; pt verbalizes understanding of appt date/time, parking situation and where to check in, medications ordered, and verified current allergies; name and call back number provided for further questions should they arise  [facility] [phone] office [phone] cell

## 2019-12-15 NOTE — Telephone Encounter (Signed)
If he is still swelling it sounds like he should go back to the Torsemide 40 mg bid.  Call Mr. Lutes with the results and send results to Timoteo Gaul, FNP

## 2019-12-16 ENCOUNTER — Other Ambulatory Visit: Payer: Self-pay

## 2019-12-16 ENCOUNTER — Emergency Department (HOSPITAL_COMMUNITY)
Admission: EM | Admit: 2019-12-16 | Discharge: 2019-12-16 | Discharge status: Against Medical Advice | Payer: 59 | Attending: Emergency Medicine | Admitting: Emergency Medicine

## 2019-12-16 ENCOUNTER — Ambulatory Visit (HOSPITAL_COMMUNITY)
Admission: RE | Admit: 2019-12-16 | Discharge: 2019-12-16 | Disposition: A | Discharge status: Home / Self Care | Payer: 59 | Source: Ambulatory Visit | Attending: Cardiology | Admitting: Cardiology

## 2019-12-16 DIAGNOSIS — Z85038 Personal history of other malignant neoplasm of large intestine: Secondary | ICD-10-CM | POA: Insufficient documentation

## 2019-12-16 DIAGNOSIS — R4182 Altered mental status, unspecified: Secondary | ICD-10-CM | POA: Insufficient documentation

## 2019-12-16 DIAGNOSIS — R5383 Other fatigue: Secondary | ICD-10-CM | POA: Insufficient documentation

## 2019-12-16 DIAGNOSIS — I10 Essential (primary) hypertension: Secondary | ICD-10-CM | POA: Insufficient documentation

## 2019-12-16 DIAGNOSIS — R0602 Shortness of breath: Secondary | ICD-10-CM

## 2019-12-16 DIAGNOSIS — F17210 Nicotine dependence, cigarettes, uncomplicated: Secondary | ICD-10-CM | POA: Diagnosis not present

## 2019-12-16 DIAGNOSIS — Z7901 Long term (current) use of anticoagulants: Secondary | ICD-10-CM | POA: Insufficient documentation

## 2019-12-16 DIAGNOSIS — Z794 Long term (current) use of insulin: Secondary | ICD-10-CM | POA: Diagnosis not present

## 2019-12-16 DIAGNOSIS — Z91040 Latex allergy status: Secondary | ICD-10-CM | POA: Diagnosis not present

## 2019-12-16 DIAGNOSIS — E1140 Type 2 diabetes mellitus with diabetic neuropathy, unspecified: Secondary | ICD-10-CM | POA: Insufficient documentation

## 2019-12-16 DIAGNOSIS — J45909 Unspecified asthma, uncomplicated: Secondary | ICD-10-CM | POA: Insufficient documentation

## 2019-12-16 DIAGNOSIS — I517 Cardiomegaly: Secondary | ICD-10-CM

## 2019-12-16 DIAGNOSIS — I5033 Acute on chronic diastolic (congestive) heart failure: Secondary | ICD-10-CM | POA: Diagnosis not present

## 2019-12-16 DIAGNOSIS — I110 Hypertensive heart disease with heart failure: Secondary | ICD-10-CM | POA: Diagnosis not present

## 2019-12-16 DIAGNOSIS — Z79899 Other long term (current) drug therapy: Secondary | ICD-10-CM | POA: Diagnosis not present

## 2019-12-16 LAB — COMPREHENSIVE METABOLIC PANEL
ALT: 25 U/L (ref 0–44)
AST: 33 U/L (ref 15–41)
Albumin: 3.6 g/dL (ref 3.5–5.0)
Alkaline Phosphatase: 124 U/L (ref 38–126)
Anion gap: 13 (ref 5–15)
BUN: 22 mg/dL (ref 8–23)
CO2: 33 mmol/L — ABNORMAL HIGH (ref 22–32)
Calcium: 9.2 mg/dL (ref 8.9–10.3)
Chloride: 99 mmol/L (ref 98–111)
Creatinine, Ser: 1.23 mg/dL (ref 0.61–1.24)
GFR calc Af Amer: >60 mL/min (ref >60)
GFR calc non Af Amer: >60 mL/min (ref >60)
Glucose, Bld: 176 mg/dL — ABNORMAL HIGH (ref 70–99)
Potassium: 3.7 mmol/L (ref 3.5–5.1)
Sodium: 145 mmol/L (ref 135–145)
Total Bilirubin: 0.9 mg/dL (ref 0.3–1.2)
Total Protein: 7.3 g/dL (ref 6.5–8.1)

## 2019-12-16 LAB — CBC WITH DIFFERENTIAL/PLATELET
Abs Immature Granulocytes: 0.02 10*3/uL (ref 0.00–0.07)
Basophils Absolute: 0 10*3/uL (ref 0.0–0.1)
Basophils Relative: 0 %
Eosinophils Absolute: 0.1 10*3/uL (ref 0.0–0.5)
Eosinophils Relative: 3 %
HCT: 34.7 % — ABNORMAL LOW (ref 39.0–52.0)
Hemoglobin: 10.9 g/dL — ABNORMAL LOW (ref 13.0–17.0)
Immature Granulocytes: 0 %
Lymphocytes Relative: 23 %
Lymphs Abs: 1.2 10*3/uL (ref 0.7–4.0)
MCH: 30.4 pg (ref 26.0–34.0)
MCHC: 31.4 g/dL (ref 30.0–36.0)
MCV: 96.9 fL (ref 80.0–100.0)
Monocytes Absolute: 0.5 10*3/uL (ref 0.1–1.0)
Monocytes Relative: 10 %
Neutro Abs: 3.3 10*3/uL (ref 1.7–7.7)
Neutrophils Relative %: 64 %
Platelets: 111 10*3/uL — ABNORMAL LOW (ref 150–400)
RBC: 3.58 MIL/uL — ABNORMAL LOW (ref 4.22–5.81)
RDW: 16.1 % — ABNORMAL HIGH (ref 11.5–15.5)
WBC: 5.1 10*3/uL (ref 4.0–10.5)
nRBC: 0 % (ref 0.0–0.2)

## 2019-12-16 LAB — I-STAT VENOUS BLOOD GAS, ED
Acid-Base Excess: 11 mmol/L — ABNORMAL HIGH (ref 0.0–2.0)
Bicarbonate: 37 mmol/L — ABNORMAL HIGH (ref 20.0–28.0)
Calcium, Ion: 1.13 mmol/L — ABNORMAL LOW (ref 1.15–1.40)
HCT: 32 % — ABNORMAL LOW (ref 39.0–52.0)
Hemoglobin: 10.9 g/dL — ABNORMAL LOW (ref 13.0–17.0)
O2 Saturation: 81 %
Potassium: 3.7 mmol/L (ref 3.5–5.1)
Sodium: 144 mmol/L (ref 135–145)
TCO2: 39 mmol/L — ABNORMAL HIGH (ref 22–32)
pCO2, Ven: 54 mmHg (ref 44.0–60.0)
pH, Ven: 7.444 — ABNORMAL HIGH (ref 7.250–7.430)
pO2, Ven: 44 mmHg (ref 32.0–45.0)

## 2019-12-16 LAB — SALICYLATE LEVEL: Salicylate Lvl: <7 mg/dL — ABNORMAL LOW (ref 7.0–30.0)

## 2019-12-16 LAB — ACETAMINOPHEN LEVEL: Acetaminophen (Tylenol), Serum: <10 ug/mL — ABNORMAL LOW (ref 10–30)

## 2019-12-16 LAB — CBG MONITORING, ED: Glucose-Capillary: 200 mg/dL — ABNORMAL HIGH (ref 70–99)

## 2019-12-16 MED ORDER — NALOXONE HCL 0.4 MG/ML IJ SOLN: 0.4000 mg | Freq: Once | INTRAMUSCULAR | Status: DC

## 2019-12-16 MED ORDER — ACETAMINOPHEN 500 MG PO TABS: 1000.0000 mg | ORAL_TABLET | Freq: Once | ORAL | Status: DC

## 2019-12-16 MED ORDER — GADOBUTROL 1 MMOL/ML IV SOLN
10.0000 mL | Freq: Once | INTRAVENOUS | Status: AC | PRN
  Administered 2019-12-16: 10 mL via INTRAVENOUS

## 2019-12-16 NOTE — ED Notes (Signed)
Pt signed AMA form after risks of pt leaving explained thoroughly by Providence Lanius PA.   AMA witnessed by this RN and Providence Lanius, PA

## 2019-12-16 NOTE — ED Notes (Signed)
Pt st's he is going to get up and walk out if someone doesn't get off their ass and bring me some pain meds.

## 2019-12-16 NOTE — ED Provider Notes (Signed)
[facility] EMERGENCY DEPARTMENT Provider Note   CSN: [id number] Arrival date & time: 12/16/19  1508     History Chief Complaint  Patient presents with  . lethargic    [name] is a 64 y.o. male past medical history of chronic back pain, complication of anesthesia, COPD, diabetes, GERD, hyperlipidemia, hypertension who presents for evaluation of altered mental status.  Patient brought in by [facility] for evaluation of lethargy.  Patient had an MRI that was done today.  He was prescribed Xanax and hydrocodone which he states he took this morning.  He does normally take hydrocodone for his back pain.  Family reports that once patient came home from MRI, he was more lethargic and was slow to answer questions, prompting EMS call.  On EMS arrival, he was drowsy.  He was placed on oxygen for supportive care.  On my evaluation, patient is drowsy.  He easily arouses to verbal stimuli.  He is alert and oriented x3.  He follows all commands.  He tells me that he only took the hydrocodone and the Xanax.  He denies any other drugs.  Denies any alcohol use.  He denies any chest pain, difficulty breathing, abdominal pain.  The history is provided by the patient.       Past Medical History:  Diagnosis Date  . Arthritis    lower back  . Asthma   . Atrial flutter ([place])    s/p CTI ablation by Dr Rayann Heman  . Brain aneurysm 2009   questionable. A follow up CTA in 2009 showed no evidence of  . Chronic back pain    DDD/stenosis  . Colon polyps    9 polyps removed 10/13/11  . Complication of anesthesia September 11, 2012   slow to awaken after ablation  . COPD (chronic obstructive pulmonary disease) ([place])   . Diabetes mellitus    takes Metformin and Glimepiride daily  . Emphysema   . GERD (gastroesophageal reflux disease)    takes Omeprazole bid  . History of shingles   . HLD (hyperlipidemia)    takes Pravastatin daily  . HTN (hypertension)    takes Prinizide daily  .  Obesity   . OSA (obstructive sleep apnea)    not always using cpap  . Overdose 2009   unintentional Flecanide overdose  . Peripheral neuropathy   . Short-term memory loss   . Tobacco abuse     Patient Active Problem List   Diagnosis Date Noted  . LVH (left ventricular hypertrophy) 11/07/2019  . Heme positive stool 10/03/2019  . Anemia 10/03/2019  . Insulin dependent type 2 diabetes mellitus ([place]) 08/26/2019  . Acute on chronic diastolic heart failure ([place]) 08/26/2019  . Rash and nonspecific skin eruption 08/26/2019  . Foot pain 08/22/2019  . Right heart failure ([place]) 08/07/2019  . Leg swelling 07/28/2019  . Educated about COVID-19 virus infection 07/28/2019  . Diabetes ([place]) 06/12/2019  . Hepatic cirrhosis ([place]) 12/26/2017  . Left lower quadrant pain 12/05/2017  . Change in bowel habits 12/05/2017  . Palpitations 10/03/2017  . SOB (shortness of breath) 04/12/2017  . Chest pain with moderate risk of acute coronary syndrome 08/28/2016  . Unstable angina ([place]) 08/26/2016  . Longstanding persistent atrial fibrillation ([place]) 04/21/2015  . Deviated nasal septum 02/19/2014  . History of colon polyps 11/22/2012  . Syncope 10/17/2012  . Acute asthmatic bronchitis 11/22/2011  . Benign neoplasm of colon 10/13/2011  . Unspecified gastritis and gastroduodenitis without mention of hemorrhage 10/13/2011  .  GERD (gastroesophageal reflux disease) 10/13/2011  . Obesity (BMI 30-39.9) 09/30/2011  . Dyslipidemia 06/23/2011  . Neuropathy 06/23/2011  . Fatigue 05/13/2011  . Rectal bleeding 04/18/2011  . Atrial flutter ([place]) 04/18/2011  . Chronic anticoagulation 11/10/2010  . PAF (paroxysmal atrial fibrillation) ([place])   . Tobacco abuse   . COPD (chronic obstructive pulmonary disease) ([place]) 02/11/2010  . Essential hypertension 01/28/2010  . OSA (obstructive sleep apnea) 01/28/2010    Past Surgical History:  Procedure Laterality Date  . ANTERIOR CERVICAL DECOMP/DISCECTOMY FUSION   01/04/2012   Procedure: ANTERIOR CERVICAL DECOMPRESSION/DISCECTOMY FUSION 1 LEVEL/HARDWARE REMOVAL;  Surgeon: Ophelia Charter, MD;  Location: [facility];  Service: Neurosurgery;  Laterality: N/A;  explore cervical fusion Cervical six - seven  with removal of codman plate anterior cervical decompression with fusion interbody prothesis plating and bonegraft  . APPENDECTOMY  2-27yrs ago  . ATRIAL FIBRILLATION ABLATION N/A 09/11/2012   PT DID NOT HAVE AN ATRIAL FIBRILLATION ABLATION IN 2014!  ATRIAL FLUTTER ABLATION ONLY  . ATRIAL FLUTTER ABLATION  09/11/2012   CTI ablation by Dr Rayann Heman  . BIOPSY  11/07/2019   Procedure: BIOPSY;  Surgeon: Milus Banister, MD;  Location: [facility] ENDOSCOPY;  Service: Endoscopy;;  . CARDIAC CATHETERIZATION  2008   no significant CAD  . CARDIOVERSION  05/05/2011   Procedure: CARDIOVERSION;  Surgeon: Lelon Perla, MD;  Location: [facility] ENDOSCOPY;  Service: Cardiovascular;  Laterality: N/A;  . CARDIOVERSION Bilateral 07/26/2012   Procedure: CARDIOVERSION;  Surgeon: Minus Breeding, MD;  Location: [facility] ENDOSCOPY;  Service: Cardiovascular;  Laterality: Bilateral;  . CARPAL TUNNEL RELEASE  99/2000   bilateral  . COLONOSCOPY WITH PROPOFOL N/A 12/13/2012   Procedure: COLONOSCOPY WITH PROPOFOL;  Surgeon: Milus Banister, MD;  Location: [facility] ENDOSCOPY;  Service: Endoscopy;  Laterality: N/A;  . ELECTROPHYSIOLOGIC STUDY N/A 04/21/2015   Procedure: Atrial Fibrillation Ablation;  Surgeon: Thompson Grayer, MD;  Location: [facility] CV LAB;  Service: Cardiovascular;  Laterality: N/A;  . ESOPHAGOGASTRODUODENOSCOPY (EGD) WITH PROPOFOL N/A 11/07/2019   Procedure: ESOPHAGOGASTRODUODENOSCOPY (EGD) WITH PROPOFOL;  Surgeon: Milus Banister, MD;  Location: [facility] ENDOSCOPY;  Service: Endoscopy;  Laterality: N/A;  . HERNIA REPAIR    . KNEE SURGERY  6-22yrs ago   left  . LEFT HEART CATH AND CORONARY ANGIOGRAPHY N/A 09/19/2016   Procedure: Left Heart Cath and Coronary Angiography;  Surgeon: Belva Crome, MD;   Location: [facility] CV LAB;  Service: Cardiovascular;  Laterality: N/A;  . Left inguinal hernia repair     as a child  . NASAL SEPTOPLASTY W/ TURBINOPLASTY Bilateral 02/19/2014   Procedure: NASAL SEPTOPLASTY WITH BILATERAL TURBINATE REDUCTION;  Surgeon: Jodi Marble, MD;  Location: [facility];  Service: ENT;  Laterality: Bilateral;  . NECK SURGERY  57yrs ago  . right shoulder surgery  4-32yrs ago   cyst removed  . TEE WITHOUT CARDIOVERSION  05/05/2011   Procedure: TRANSESOPHAGEAL ECHOCARDIOGRAM (TEE);  Surgeon: Lelon Perla, MD;  Location: [facility] ENDOSCOPY;  Service: Cardiovascular;  Laterality: N/A;  . TEE WITHOUT CARDIOVERSION N/A 04/21/2015   Procedure: TRANSESOPHAGEAL ECHOCARDIOGRAM (TEE);  Surgeon: Sueanne Margarita, MD;  Location: [facility] ENDOSCOPY;  Service: Cardiovascular;  Laterality: N/A;  . THROAT SURGERY  4-51yrs ago   "thought " it was cancer but came back not  . TONSILLECTOMY         Family History  Problem Relation Age of Onset  . Diabetes Mother   . Heart disease Father   . Lung cancer Father   .  Diabetes Maternal Grandmother   . Colon cancer Neg Hx   . Anesthesia problems Neg Hx   . Hypotension Neg Hx   . Malignant hyperthermia Neg Hx   . Pseudochol deficiency Neg Hx   . Esophageal cancer Neg Hx   . Pancreatic cancer Neg Hx   . Stomach cancer Neg Hx     Social History   Tobacco Use  . Smoking status: Current Every Day Smoker    Packs/day: 1.00    Years: 49.00    Pack years: 49.00    Types: Cigarettes  . Smokeless tobacco: Never Used  . Tobacco comment: 1 pack per day 12/02/19 [facility]   Vaping Use  . Vaping Use: Never used  Substance Use Topics  . Alcohol use: Not Currently    Alcohol/week: 0.0 standard drinks    Comment: 12/05/17 pt stated he has drinked 1/2 drink in 3 years  . Drug use: No    Home Medications Prior to Admission medications   Medication Sig Start Date End Date Taking? Authorizing Provider  albuterol (PROAIR HFA) 108 (90 Base) MCG/ACT inhaler  Inhale 1-2 puffs into the lungs every 6 (six) hours as needed for wheezing or shortness of breath. 12/03/19   Parrett, Fonnie Mu, NP  Ascorbic Acid (VITAMIN C PO) Take 1,000 mg by mouth daily.     [provider]  b complex vitamins tablet Take 1 tablet by mouth daily.     [provider]  B-D UF III MINI PEN NEEDLES 31G X 5 MM MISC 1 Device by Other route 3 (three) times daily with meals. Use twice daily to inject insulin. 12/10/19   Renato Shin, MD  bisoprolol (ZEBETA) 10 MG tablet Take 10 mg by mouth daily.     [provider]  diltiazem (TIAZAC) 120 MG 24 hr capsule Take 1 capsule by mouth daily.  10/15/19   [provider]  ferrous sulfate 324 MG TBEC Take 324 mg by mouth daily.     [provider]  gabapentin (NEURONTIN) 100 MG capsule Take 100-200 mg by mouth See admin instructions. Take 100 mg in the morning and 200 mg at bedtime    [provider]  HYDROcodone-acetaminophen (NORCO) 7.5-325 MG tablet Take 1 tablet by mouth 4 (four) times daily.     [provider]  insulin aspart (NOVOLOG FLEXPEN) 100 UNIT/ML FlexPen Inject 65 Units into the skin 3 (three) times daily with meals. 12/10/19   Renato Shin, MD  levocetirizine (XYZAL) 5 MG tablet Take 5 mg by mouth every evening.     [provider]  losartan (COZAAR) 50 MG tablet Take 1 tablet (50 mg total) by mouth daily. 04/11/18   Minus Breeding, MD  metFORMIN (GLUCOPHAGE-XR) 500 MG 24 hr tablet Take 1 tablet (500 mg total) by mouth at bedtime. 12/10/19   Renato Shin, MD  metolazone (ZAROXOLYN) 2.5 MG tablet TAKE 1 TABLET ON Tuesday AND Thursday 30 MINUTES BEFORE TAKING TORSEMIDE OR AS DIRECTED 09/17/19   Sande Rives E, PA-C  Multiple Vitamin (MULTIVITAMIN WITH MINERALS) TABS Take 1 tablet by mouth daily.     [provider]  Multiple Vitamins-Minerals (ZINC PO) Take 50 mg by mouth daily.     [provider]  nitroGLYCERIN (NITROSTAT) 0.4 MG SL tablet  Place 1 tablet (0.4 mg total) under the tongue every 5 (five) minutes x 3 doses as needed for chest pain. 08/28/16   Theora Gianotti, NP  omeprazole (PRILOSEC) 40 MG capsule Take 1  capsule (40 mg total) by mouth 2 (two) times daily before a meal. 10/03/19   Zehr, Janett Billow D, PA-C  pantoprazole (PROTONIX) 20 MG tablet Take 20 mg by mouth 2 (two) times daily.  11/06/19   [provider]  potassium chloride SA (KLOR-CON M20) 20 MEQ tablet Take 1 tablet (20 mEq total) by mouth daily. TAKE AN EXTRA TABLET ON Tuesday AND Thursday 09/17/19   Darreld Mclean, PA-C  pravastatin (PRAVACHOL) 80 MG tablet TAKE 1 TABLET BY MOUTH DAILY AT BEDTIME 04/04/18   Minus Breeding, MD  sildenafil (REVATIO) 20 MG tablet Take 20-80 mg by mouth daily as needed (erectile dysfunction).     [provider]  torsemide (DEMADEX) 20 MG tablet Take 40 mg by mouth 2 (two) times daily.  09/30/19   Minus Breeding, MD  umeclidinium-vilanterol (ANORO ELLIPTA) 62.5-25 MCG/INH AEPB Inhale 1 puff into the lungs daily. 12/03/19   Parrett, Fonnie Mu, NP  VITAMIN A PO Take 2,400 mcg by mouth daily.     [provider]  Vitamin D, Ergocalciferol, (DRISDOL) 1.25 MG (50000 UT) CAPS capsule Take 50,000 Units by mouth every 7 (seven) days.     [provider]  warfarin (COUMADIN) 5 MG tablet Take 2.5-5 mg by mouth See admin instructions. Tale 2.5 mg on Monday Take 5 mg every other day    [provider]    Allergies    Adhesive [tape] and Latex  Review of Systems   Review of Systems  Respiratory: Negative for shortness of breath.   Cardiovascular: Negative for chest pain.  Gastrointestinal: Negative for abdominal pain, nausea and vomiting.  Neurological: Negative for weakness and numbness.  Psychiatric/Behavioral: Positive for confusion.  All other systems reviewed and are negative.   Physical Exam Updated Vital Signs BP 117/61 (BP Location: Right Arm)   Pulse 60   Temp 98.5 F (36.9  C) (Oral)   Resp 14   SpO2 96%   Physical Exam Vitals and nursing note reviewed.  Constitutional:      Appearance: Normal appearance. He is well-developed.  HENT:     Head: Normocephalic and atraumatic.  Eyes:     General: Lids are normal.     Conjunctiva/sclera: Conjunctivae normal.     Pupils: Pupils are equal, round, and reactive to light.     Comments: Pupils 3 mm bilaterally. EOMs intact.   Cardiovascular:     Rate and Rhythm: Normal rate and regular rhythm.     Pulses: Normal pulses.          Radial pulses are 2+ on the right side and 2+ on the left side.       Dorsalis pedis pulses are 2+ on the right side and 2+ on the left side.     Heart sounds: Normal heart sounds. No murmur heard.  No friction rub. No gallop.   Pulmonary:     Effort: Pulmonary effort is normal.     Breath sounds: Normal breath sounds.     Comments: Lungs clear to auscultation bilaterally.  Symmetric chest rise.  No wheezing, rales, rhonchi. Abdominal:     Palpations: Abdomen is soft. Abdomen is not rigid.     Tenderness: There is no abdominal tenderness. There is no guarding.     Comments: Abdomen is soft, non-distended, non-tender. No rigidity, No guarding. No peritoneal signs.  Musculoskeletal:        General: Normal range of motion.     Cervical back: Full passive range of motion without  pain.  Skin:    General: Skin is warm and dry.     Capillary Refill: Capillary refill takes less than 2 seconds.  Neurological:     Mental Status: He is alert and oriented to person, place, and time.     Comments: Alert and oriented x3. Cranial nerves III-XII intact Follows commands, Moves all extremities  5/5 strength to BUE and BLE  Sensation intact throughout all major nerve distributions No facial droop.  Drowsy but alert to verbal stimuli  Psychiatric:        Speech: Speech normal.     ED Results / Procedures / Treatments   Labs (all labs ordered are listed, but only abnormal results are  displayed) Labs Reviewed  COMPREHENSIVE METABOLIC PANEL - Abnormal; Notable for the following components:      Result Value   CO2 33 (*)    Glucose, Bld 176 (*)    All other components within normal limits  CBC WITH DIFFERENTIAL/PLATELET - Abnormal; Notable for the following components:   RBC 3.58 (*)    Hemoglobin 10.9 (*)    HCT 34.7 (*)    RDW 16.1 (*)    All other components within normal limits  CBG MONITORING, ED - Abnormal; Notable for the following components:   Glucose-Capillary 200 (*)    All other components within normal limits  I-STAT VENOUS BLOOD GAS, ED - Abnormal; Notable for the following components:   pH, Ven 7.444 (*)    Bicarbonate 37.0 (*)    TCO2 39 (*)    Acid-Base Excess 11.0 (*)    Calcium, Ion 1.13 (*)    HCT 32.0 (*)    Hemoglobin 10.9 (*)    All other components within normal limits  URINALYSIS, ROUTINE W REFLEX MICROSCOPIC  BLOOD GAS, VENOUS  BLOOD GAS, VENOUS  RAPID URINE DRUG SCREEN, HOSP PERFORMED  ACETAMINOPHEN LEVEL  SALICYLATE LEVEL    EKG None  Radiology No results found.  Procedures Procedures (including critical care time)  Medications Ordered in ED Medications  naloxone (NARCAN) injection 0.4 mg (has no administration in time range)  acetaminophen (TYLENOL) tablet 1,000 mg (has no administration in time range)    ED Course  I have reviewed the triage vital signs and the nursing notes.  Pertinent labs & imaging results that were available during my care of the patient were reviewed by me and considered in my medical decision making (see chart for details).    MDM Rules/Calculators/A&P                          64 year old male who was brought in by EMS for evaluation of lethargy.  Family called EMS after patient was slow to respond.  He had an MRI done today where he had Xanax and hydrocodone prior to his imaging.  They reported that afterwards, he was sleepy.  On EMS arrival, he is alert to verbal stimuli.  He is drowsy  but is able to easily wake and answer my questions.  He is able to follow my commands.  No neuro deficits noted on exam.  I suspect this is a combination of Xanax and hydrocodone that he took prior to imaging.  He also gets prescriptions for hydrocodone at home.  He states that he did not take any more but unclear.  We will check other labs for other process that could be contributing to lethargy.  He denies any alcohol use.  We will plan for  labs.  Narcan ordered.  I-STAT VBG shows pH of 7.4.  PCO2 is 54.0 POC CBG is 200.  I was called into the room.  Patient requesting pain medication for his back pain.  Specifically he is requesting hydrocodone which he states he takes at home.  I discussed with patient that given my concerns for overdose and lethargy, I do not feel comfortable giving him more narcotic pain medication that would make him sleepy.  I offered a nonsedative option such as Tylenol or ibuprofen but patient declined.  He again requested narcotic pain medication.  I discussed with patient that at this time, I do not feel comfortable giving him more sedative medication.  Patient states that if he did not get pain medication, he was going to get up and walk out.  I discussed with patient's wife and his sister via phone.  Wife states he has more hydrocodone at home that he takes for his chronic back pain.  I attempted to have patient discuss on the phone with his sister, wife and other family members.  After extensive discussion, with patient and family, patient did not wish to stay any longer.  His family also expressed concerns about him receiving more narcotic pain medication in the hospital and were agreement with my plan of management.  Patient states that he did not want to sit here and suffering pain.  Again offered him other alternative options that would not cause increasing sedative effect but patient declined.  I discussed with patient regarding that I still had labs pending on his  work-up to see if there is any other cause of his symptoms.  I offered to treat his pain in a nonsedative, nonnarcotic way but patient declined.  Patient states he is going to leave.  I discussed with patient that if he left at this time, he would be leaving [place].  I discussed with him regarding the risk first benefits of leaving AMA, including but not limited to worsening condition, death.  Patient is able to answer all my questions appropriately.  He is speaking in full sentences without any difficulty.  He is alert and oriented x3.  This conversation was witnessed by nurse Maudie Mercury fields.  Patient appears clinically sober and able to make full medical decision-making capacity.  At this time, he does not appear altered and is alert and oriented x3.  Patient denies any SI, HI.  There is no indication for IVC in place.  Patient expresses understanding and will leave AMA.  I updated Dr. Langston Masker on this and he is in agreement with plan.  Portions of this note were generated with [employer]obby[employer]. Dictation errors may occur despite best attempts at proofreading.   Final Clinical Impression(s) / ED Diagnoses Final diagnoses:  Lethargy    Rx / DC Orders ED Discharge Orders    None       Desma Mcgregor 12/16/19 1642    Wyvonnia Dusky, MD 12/16/19 [phone]

## 2019-12-16 NOTE — ED Notes (Signed)
Pt sitting up on stretcher and dozing off

## 2019-12-16 NOTE — ED Notes (Signed)
[place]  ( SISTER )  WOULD LIKE AN UPDATE PLEASE

## 2019-12-16 NOTE — ED Triage Notes (Addendum)
Pt here from home via [facility] for lethargy and delayed responses. Pt had schedule MRI today of his heart, instructed by his MD to take a hydrocodone and xanax prior to MRI. Pt returned home and family reports he is lethargic and has slowed responses. Ems placed pt on 2L O2 via [place] for sat 89% on RA, SpO2 now 94% on 2L. Pt aox4, vss.

## 2019-12-18 ENCOUNTER — Telehealth: Payer: Self-pay | Admitting: Cardiology

## 2019-12-18 NOTE — Telephone Encounter (Signed)
Patient called to express his frustrations from his MRI on 8/2 to Dr. Percival Spanish, which he states was a bad experience. He stated he was asked by the MRI staff how much he drinks. He states that he asked them repeatedly for them to get him out of the MRI machine but they were not listening to him, and then he states he passed out. When he got home, he states he turned blue and almost died which prompted family to call 911. Patient also stated he wanted the head of the MRI department to call him, advised the patient he would need to call the hospital to speak with them.

## 2019-12-19 ENCOUNTER — Other Ambulatory Visit: Payer: Self-pay

## 2019-12-19 ENCOUNTER — Ambulatory Visit (INDEPENDENT_AMBULATORY_CARE_PROVIDER_SITE_OTHER): Payer: 59 | Admitting: Physician Assistant

## 2019-12-19 DIAGNOSIS — L509 Urticaria, unspecified: Secondary | ICD-10-CM

## 2019-12-19 DIAGNOSIS — L920 Granuloma annulare: Secondary | ICD-10-CM

## 2019-12-19 MED ORDER — TRIAMCINOLONE ACETONIDE 10 MG/ML IJ SUSP
5.0000 mg | Freq: Once | INTRAMUSCULAR | Status: AC
  Administered 2019-12-19: 5 mg

## 2019-12-19 MED ORDER — TRIAMCINOLONE ACETONIDE 0.1 % EX CREA: 1.0000 "application " | TOPICAL_CREAM | Freq: Every day | CUTANEOUS | 2 refills | Status: DC | PRN

## 2019-12-19 NOTE — Progress Notes (Signed)
   Follow up Visit  Subjective  [name] is a 64 y.o. male who presents for the following: Rash (area breaking ou between stomach and knees. Patient had shingles when he was in his 30s on the right side and that spot is irritated. x since april. was here for same thing in april. ). He says the skin on the abdomen and knees have been breaking out in whelps and itching. He says it looked much worse yesterday and itched a lot. The area on his right side where he had shingles 30 years ago has been bothering him and feels like the skin is rubbed raw. He has recently stopped taking his gabapentin because he is afraid it is hurting his liver. His biopsy confirmed GA is still on his right hand and right forearm and sometimes feels sore.  Objective  Well appearing patient in no apparent distress; mood and affect are within normal limits.  A focused examination was performed including arms, abdomen and legs.. Relevant physical exam findings are noted in the Assessment and Plan.   Objective  Right Dorsal Hand, Right Forearm - Anterior, Right Hand - Posterior: Dusky nummular areas forearms and dorsum of right hand  Objective  Left Abdomen (side) - Lower, Left Thigh - Anterior, Right Abdomen (side) - Upper, Right Thigh - Anterior: His abdomen does have some pink papules today. He says yesterday it was much worse with larger red whelps and itched from abdomen knees.  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 He says Dr. Allyson Sabal did it before and he didn't have problems. Advised to monitor sugars closely.  Intralesional injection - Right Dorsal Hand, Right Forearm - Anterior, Right Hand - Posterior Location: right hand  Informed Consent: Discussed risks (infection, pain, bleeding, bruising, thinning of the skin, loss of skin pigment,  Indentation, lack of resolution, and  recurrence of lesion) and benefits of the procedure, as well as the alternatives. Informed consent was obtained. Preparation: The area was prepared in a standard fashion.   Procedure Details: An intralesional injection was performed with Kenalog 5 mg/cc. 1 cc in total were injected.  Total number of injections: >7  Plan: The patient was instructed on post-op care. Recommend OTC analgesia as needed for pain.   triamcinolone acetonide (KENALOG) 10 MG/ML injection 5 mg - Right Hand - Posterior  Urticaria (4) Left Thigh - Anterior; Right Thigh - Anterior; Right Abdomen (side) - Upper; Left Abdomen (side) - Lower  Continue xyzal.   Ordered Medications: triamcinolone cream (KENALOG) 0.1 %

## 2019-12-20 ENCOUNTER — Other Ambulatory Visit: Payer: Self-pay | Admitting: Medical

## 2019-12-20 ENCOUNTER — Telehealth: Payer: Self-pay | Admitting: Cardiology

## 2019-12-20 NOTE — Telephone Encounter (Signed)
Pt c/o of Chest Pain: STAT if CP now or developed within 24 hours  1. Are you having CP right now? Yes, but it is easing  2. Are you experiencing any other symptoms (ex. SOB, nausea, vomiting, sweating)? SOB when moving around  3. How long have you been experiencing CP? Since monday  4. Is your CP continuous or coming and going? Comes and goes on exertion  5. Have you taken Nitroglycerin? No  Patient states it is hard for him to describe the feeling, but states it is like a chest tightness or discomfort. He states he gets it on exertion and also gets SOB and pain in his left arm. He states it started Monday.

## 2019-12-20 NOTE — Telephone Encounter (Signed)
This is Dr. Hochrein's pt. °

## 2019-12-20 NOTE — Telephone Encounter (Signed)
Spoke to pt who report he had a  MRI on Monday and ever since hasn't been feeling well. Pt voiced the day of the MRI he remember going in the machine but only bits and pieces after. He report he was given a mild sedative medication to relax him but only remember the nurse thinking he was drunk. Pt state prior to MRI he hasn't had to use his cane in a long time, but now requiring it due to being a little unsteady . Pt also report he's been experiencing some chest discomfort and SOB after walking long distance. He state today he walked from the car to [place] and had to turn around because he was weak, felt like his heart was beating funny, had left arm pain, and was SOB. Pt voiced symptoms are relieved with rest.   Nurse inform pt a message would be sent to MD for recommendations but if symptoms reoccur to report to ER for further evaluations. Pt verbalized understanding.

## 2019-12-21 ENCOUNTER — Encounter: Payer: Self-pay | Admitting: Physician Assistant

## 2019-12-22 NOTE — Telephone Encounter (Signed)
See if he can be worked into see an APP this week.

## 2019-12-23 ENCOUNTER — Telehealth: Payer: Self-pay | Admitting: Endocrinology

## 2019-12-23 ENCOUNTER — Other Ambulatory Visit: Payer: Self-pay

## 2019-12-23 ENCOUNTER — Ambulatory Visit (INDEPENDENT_AMBULATORY_CARE_PROVIDER_SITE_OTHER): Payer: 59

## 2019-12-23 DIAGNOSIS — Z5181 Encounter for therapeutic drug level monitoring: Secondary | ICD-10-CM | POA: Diagnosis not present

## 2019-12-23 DIAGNOSIS — I480 Paroxysmal atrial fibrillation: Secondary | ICD-10-CM | POA: Diagnosis not present

## 2019-12-23 DIAGNOSIS — Z7901 Long term (current) use of anticoagulants: Secondary | ICD-10-CM

## 2019-12-23 LAB — POCT INR: INR: 2.4 (ref 2.0–3.0)

## 2019-12-23 MED ORDER — NOVOLOG FLEXPEN 100 UNIT/ML ~~LOC~~ SOPN: 80.0000 [IU] | PEN_INJECTOR | Freq: Three times a day (TID) | SUBCUTANEOUS | 11 refills | Status: DC

## 2019-12-23 NOTE — Patient Instructions (Signed)
Take 5mg  daily except for 1/2 tablet every Monday. Repeat INR in 6 weeks

## 2019-12-23 NOTE — Telephone Encounter (Signed)
Patient called stating he may need to up his aspart to 75 because he's noticing he needs to take a little bit more - and he would need a refill for that sent to the pharmacy: [facility], [place]. Phone:  [phone]  Fax:  [phone]     He also requests to be taking the metformin from 1xdaily to 2xdaily and if so, could a new rx be sent for that as well? Ph# [phone]

## 2019-12-23 NOTE — Telephone Encounter (Signed)
Please increase to 80 units 3 times a day (just before each meal). I have sent a prescription to your pharmacy.  We can't increase metformin due to CHF.

## 2019-12-23 NOTE — Telephone Encounter (Signed)
Pt reports that he is taking 75 units TID. Would you like to send a new Rx for this dose? Pt also reports that he was doing better when taking Metformin BID. He would like to know if you would considering increasing these.

## 2019-12-24 ENCOUNTER — Other Ambulatory Visit: Payer: Self-pay

## 2019-12-24 DIAGNOSIS — N1831 Chronic kidney disease, stage 3a: Secondary | ICD-10-CM

## 2019-12-24 MED ORDER — NOVOLOG FLEXPEN 100 UNIT/ML ~~LOC~~ SOPN: 80.0000 [IU] | PEN_INJECTOR | Freq: Three times a day (TID) | SUBCUTANEOUS | 0 refills | Status: DC

## 2019-12-24 NOTE — Telephone Encounter (Signed)
LVM requesting returned call 

## 2019-12-24 NOTE — Telephone Encounter (Signed)
Outpatient Medication Detail   Disp Refills Start End   insulin aspart (NOVOLOG FLEXPEN) 100 UNIT/ML FlexPen 216 mL 0 12/24/2019    Sig - Route: Inject 80 Units into the skin 3 (three) times daily with meals. E11.9 - Subcutaneous   Sent to pharmacy as: insulin aspart (NOVOLOG FLEXPEN) 100 UNIT/ML FlexPen   E-Prescribing Status: Receipt confirmed by pharmacy (12/24/2019  1:24 PM EDT)

## 2019-12-24 NOTE — Telephone Encounter (Addendum)
Spoke with patient and he is feeling better now Offered appointment but he would like wait and see if he continues to approve, he will call back if anything changes

## 2019-12-24 NOTE — Telephone Encounter (Signed)
Pt returned call. Informed about Dr. Cordelia Pen orders below. Verbalized acceptance and understanding.

## 2019-12-24 NOTE — Telephone Encounter (Signed)
Patient called re: RX for insulin aspart (NOVOLOG FLEXPEN) 100 UNIT/ML FlexPen has not been received by the [name] (showing no print 12/23/19). Patient states Loanne Drilling changed dosage to 80 units 3 x per day. Dr.Patient requests that the above RX be sent asap to: [facility], [address]. Phone:  [phone]  Fax:  [phone]

## 2020-01-06 ENCOUNTER — Encounter: Payer: Self-pay | Admitting: Gastroenterology

## 2020-01-06 ENCOUNTER — Other Ambulatory Visit: Payer: Self-pay

## 2020-01-06 ENCOUNTER — Ambulatory Visit (INDEPENDENT_AMBULATORY_CARE_PROVIDER_SITE_OTHER): Payer: 59 | Admitting: Gastroenterology

## 2020-01-06 VITALS — BP 110/60 | HR 71 | Ht 74.0 in | Wt 266.1 lb

## 2020-01-06 DIAGNOSIS — K7460 Unspecified cirrhosis of liver: Secondary | ICD-10-CM

## 2020-01-06 NOTE — Progress Notes (Signed)
HPI: This is a very pleasant 64 year old man whom I last saw the time of an upper endoscopy about 2 months ago.  See those results summarized below.  I personally last saw him in the office May 2013, 8 years ago.  Since then I have performed a few endoscopic procedures for him and he has been followed in the office by one of our extenders, mainly [employer].  His last office visit here was 3 months ago.  He was sent by cardiology for concern of decrease in his hemoglobin.  He has known cirrhosis that is felt to be due to previous alcohol use after extensive serologic evaluation was otherwise negative.  His hemoglobin May 2021 was just slightly low at 11.8.  He reported frequent dark stools.  His platelets were mildly low at 143, his INR was supratherapeutic, he takes Coumadin for atrial fibrillation.  His LFTs were normal except for slightly elevated alkaline phosphatase.  Colonoscopy October 2019 showed 3 polyps that were removed, one was a tubular adenoma, another sessile serrated polyp, and other was benign colonic mucosa.  Repeat recommended in 5 years.  EGD October 2019 showed only gastritis, biopsies negative for H. pylori.  Repeat EGD recommended 3-year interval for esophageal varices surveillance.  Janett Billow ordered an ultrasound for him and that suggested a coarse echogenic enlarged liver "corresponding to history of cirrhosis" no focal masses.  There was splenomegaly.  She also ordered repeat labs, hemoglobin A1c was elevated at 8.1  Given the concern for GI bleeding I repeated his upper endoscopy June 2021 and it showed mild nonspecific gastritis.  Biopsies were negative for H. pylori.  He had no signs of portal hypertension.  When the biopsies returned we explained to him that it would be safe to continue on his blood thinner.  CBC 3 weeks ago showed hemoglobin 10.9, platelets 111, completely normal liver tests  3 weeks ago he was brought to the emergency room by EMS for acute  mental status changes.  There was concern for polypharmacy taking narcotics and Xanax together.  He was lethargic and very irritable with the staff within 45 minutes of his arrival "he was demanding hydrocodone or threatening to leave.  We refused this medication but offered nonnarcotic alternatives for back pain.  He refused this and did not wish to stay for further work-up."  He signed out AMA from the emergency room.   This morning he mainly talked about how his endocrinologist is "trying to kill him" and the MRI test which she had earlier this month "almost killed him".  He has seen minor rectal bleeding twice in the past 6 or 7 weeks.  He has his usual mild abdominal discomforts.  Otherwise feels pretty well.  He continues to smoke, he does not drink   ROS: complete GI ROS as described in HPI, all other review negative.  Constitutional:  No unintentional weight loss   Past Medical History:  Diagnosis Date  . Arthritis    lower back  . Asthma   . Atrial flutter ([place])    s/p CTI ablation by Dr Rayann Heman  . Brain aneurysm 2009   questionable. A follow up CTA in 2009 showed no evidence of  . Chronic back pain    DDD/stenosis  . Colon polyps    9 polyps removed 10/13/11  . Complication of anesthesia September 11, 2012   slow to awaken after ablation  . COPD (chronic obstructive pulmonary disease) ([place])   . Diabetes mellitus  takes Metformin and Glimepiride daily  . Emphysema   . GERD (gastroesophageal reflux disease)    takes Omeprazole bid  . History of shingles   . HLD (hyperlipidemia)    takes Pravastatin daily  . HTN (hypertension)    takes Prinizide daily  . Obesity   . OSA (obstructive sleep apnea)    not always using cpap  . Overdose 2009   unintentional Flecanide overdose  . Peripheral neuropathy   . Short-term memory loss   . Tobacco abuse     Past Surgical History:  Procedure Laterality Date  . ANTERIOR CERVICAL DECOMP/DISCECTOMY FUSION  01/04/2012    Procedure: ANTERIOR CERVICAL DECOMPRESSION/DISCECTOMY FUSION 1 LEVEL/HARDWARE REMOVAL;  Surgeon: Ophelia Charter, MD;  Location: [facility]S;  Service: Neurosurgery;  Laterality: N/A;  explore cervical fusion Cervical six - seven  with removal of codman plate anterior cervical decompression with fusion interbody prothesis plating and bonegraft  . APPENDECTOMY  2-53yrs ago  . ATRIAL FIBRILLATION ABLATION N/A 09/11/2012   PT DID NOT HAVE AN ATRIAL FIBRILLATION ABLATION IN 2014!  ATRIAL FLUTTER ABLATION ONLY  . ATRIAL FLUTTER ABLATION  09/11/2012   CTI ablation by Dr Rayann Heman  . BIOPSY  11/07/2019   Procedure: BIOPSY;  Surgeon: Milus Banister, MD;  Location: [facility] ENDOSCOPY;  Service: Endoscopy;;  . CARDIAC CATHETERIZATION  2008   no significant CAD  . CARDIOVERSION  05/05/2011   Procedure: CARDIOVERSION;  Surgeon: Lelon Perla, MD;  Location: [facility] ENDOSCOPY;  Service: Cardiovascular;  Laterality: N/A;  . CARDIOVERSION Bilateral 07/26/2012   Procedure: CARDIOVERSION;  Surgeon: Minus Breeding, MD;  Location: [facility] ENDOSCOPY;  Service: Cardiovascular;  Laterality: Bilateral;  . CARPAL TUNNEL RELEASE  99/2000   bilateral  . COLONOSCOPY WITH PROPOFOL N/A 12/13/2012   Procedure: COLONOSCOPY WITH PROPOFOL;  Surgeon: Milus Banister, MD;  Location: [facility] ENDOSCOPY;  Service: Endoscopy;  Laterality: N/A;  . ELECTROPHYSIOLOGIC STUDY N/A 04/21/2015   Procedure: Atrial Fibrillation Ablation;  Surgeon: Thompson Grayer, MD;  Location: [facility] CV LAB;  Service: Cardiovascular;  Laterality: N/A;  . ESOPHAGOGASTRODUODENOSCOPY (EGD) WITH PROPOFOL N/A 11/07/2019   Procedure: ESOPHAGOGASTRODUODENOSCOPY (EGD) WITH PROPOFOL;  Surgeon: Milus Banister, MD;  Location: [facility] ENDOSCOPY;  Service: Endoscopy;  Laterality: N/A;  . HERNIA REPAIR    . KNEE SURGERY  6-85yrs ago   left  . LEFT HEART CATH AND CORONARY ANGIOGRAPHY N/A 09/19/2016   Procedure: Left Heart Cath and Coronary Angiography;  Surgeon: Belva Crome, MD;  Location: [facility] CV LAB;  Service: Cardiovascular;  Laterality: N/A;  . Left inguinal hernia repair     as a child  . NASAL SEPTOPLASTY W/ TURBINOPLASTY Bilateral 02/19/2014   Procedure: NASAL SEPTOPLASTY WITH BILATERAL TURBINATE REDUCTION;  Surgeon: Jodi Marble, MD;  Location: [facility];  Service: ENT;  Laterality: Bilateral;  . NECK SURGERY  61yrs ago  . right shoulder surgery  4-69yrs ago   cyst removed  . TEE WITHOUT CARDIOVERSION  05/05/2011   Procedure: TRANSESOPHAGEAL ECHOCARDIOGRAM (TEE);  Surgeon: Lelon Perla, MD;  Location: [facility] ENDOSCOPY;  Service: Cardiovascular;  Laterality: N/A;  . TEE WITHOUT CARDIOVERSION N/A 04/21/2015   Procedure: TRANSESOPHAGEAL ECHOCARDIOGRAM (TEE);  Surgeon: Sueanne Margarita, MD;  Location: [facility] ENDOSCOPY;  Service: Cardiovascular;  Laterality: N/A;  . THROAT SURGERY  4-63yrs ago   "thought " it was cancer but came back not  . TONSILLECTOMY      Current Outpatient Medications  Medication Sig Dispense Refill  . albuterol (PROAIR HFA) 108 (  90 Base) MCG/ACT inhaler Inhale 1-2 puffs into the lungs every 6 (six) hours as needed for wheezing or shortness of breath. 18 g 5  . Ascorbic Acid (VITAMIN C PO) Take 1,000 mg by mouth daily.     [name] b complex vitamins tablet Take 1 tablet by mouth daily.     . B-D UF III MINI PEN NEEDLES 31G X 5 MM MISC 1 Device by Other route 3 (three) times daily with meals. Use twice daily to inject insulin. 270 each 3  . bisoprolol (ZEBETA) 10 MG tablet Take 10 mg by mouth daily.     [name] diltiazem (TIAZAC) 120 MG 24 hr capsule Take 1 capsule by mouth daily.     . ferrous sulfate 324 MG TBEC Take 324 mg by mouth daily.     [name] gabapentin (NEURONTIN) 100 MG capsule Take 100-200 mg by mouth See admin instructions. Take 100 mg in the morning and 200 mg at bedtime    . HYDROcodone-acetaminophen (NORCO) 7.5-325 MG tablet Take 1 tablet by mouth 4 (four) times daily. Patient is taking 10 mg    . insulin aspart (NOVOLOG FLEXPEN) 100 UNIT/ML FlexPen Inject 80  Units into the skin 3 (three) times daily with meals. E11.9 216 mL 0  . levocetirizine (XYZAL) 5 MG tablet Take 5 mg by mouth every evening.     [name] losartan (COZAAR) 50 MG tablet Take 1 tablet (50 mg total) by mouth daily. 90 tablet 3  . metFORMIN (GLUCOPHAGE-XR) 500 MG 24 hr tablet Take 1 tablet (500 mg total) by mouth at bedtime. 90 tablet 3  . metolazone (ZAROXOLYN) 2.5 MG tablet TAKE 1 TABLET ON Tuesday AND Thursday 30 MINUTES BEFORE TAKING TORSEMIDE OR AS DIRECTED 15 tablet 3  . Multiple Vitamin (MULTIVITAMIN WITH MINERALS) TABS Take 1 tablet by mouth daily.     . Multiple Vitamins-Minerals (ZINC PO) Take 50 mg by mouth daily.     . nitroGLYCERIN (NITROSTAT) 0.4 MG SL tablet Place 1 tablet (0.4 mg total) under the tongue every 5 (five) minutes x 3 doses as needed for chest pain. 25 tablet 3  . pantoprazole (PROTONIX) 20 MG tablet Take 20 mg by mouth 2 (two) times daily.     . potassium chloride SA (KLOR-CON M20) 20 MEQ tablet Take 1 tablet (20 mEq total) by mouth daily. TAKE AN EXTRA TABLET ON Tuesday AND Thursday 40 tablet 3  . pravastatin (PRAVACHOL) 80 MG tablet TAKE 1 TABLET BY MOUTH DAILY AT BEDTIME 30 tablet 10  . sildenafil (REVATIO) 20 MG tablet Take 20-80 mg by mouth daily as needed (erectile dysfunction).     . torsemide (DEMADEX) 20 MG tablet TAKE 2 TABLETS BY MOUTH 2 TIMES DAILY 270 tablet 3  . triamcinolone cream (KENALOG) 0.1 % Apply 1 application topically daily as needed. 453 g 2  . umeclidinium-vilanterol (ANORO ELLIPTA) 62.5-25 MCG/INH AEPB Inhale 1 puff into the lungs daily. 60 each 5  . VITAMIN A PO Take 2,400 mcg by mouth daily.     . Vitamin D, Ergocalciferol, (DRISDOL) 1.25 MG (50000 UT) CAPS capsule Take 50,000 Units by mouth every 7 (seven) days.     [name] warfarin (COUMADIN) 5 MG tablet Take 2.5-5 mg by mouth See admin instructions. Tale 2.5 mg on Monday Take 5 mg every other day     No current facility-administered medications for this visit.    Allergies as of  01/06/2020 - Review Complete 01/06/2020  Allergen Reaction Noted  . Adhesive [tape]  02/14/2014  .  Latex Itching 05/02/2011    Family History  Problem Relation Age of Onset  . Diabetes Mother   . Heart disease Father   . Lung cancer Father   . Diabetes Maternal Grandmother   . Colon cancer Neg Hx   . Anesthesia problems Neg Hx   . Hypotension Neg Hx   . Malignant hyperthermia Neg Hx   . Pseudochol deficiency Neg Hx   . Esophageal cancer Neg Hx   . Pancreatic cancer Neg Hx   . Stomach cancer Neg Hx     Social History   Socioeconomic History  . Marital status: Married    Spouse name: Not on file  . Number of children: 2  . Years of education: Not on file  . Highest education level: Not on file  Occupational History  . Occupation: Dealer   Tobacco Use  . Smoking status: Current Every Day Smoker    Packs/day: 1.00    Years: 49.00    Pack years: 49.00    Types: Cigarettes  . Smokeless tobacco: Never Used  . Tobacco comment: 1 pack per day 12/02/19 [facility]   Vaping Use  . Vaping Use: Never used  Substance and Sexual Activity  . Alcohol use: Not Currently    Alcohol/week: 0.0 standard drinks    Comment: 12/05/17 pt stated he has drinked 1/2 drink in 3 years  . Drug use: No  . Sexual activity: Yes  Other Topics Concern  . Not on file  Social History Narrative   Daily caffeine(Mountain Dew)       Lives in [place] with spouse.   Unemployed due to chronic back/ leg pain         Social Determinants of Health   Financial Resource Strain:   . Difficulty of Paying Living Expenses: Not on file  Food Insecurity:   . Worried About Charity fundraiser in the Last Year: Not on file  . Ran Out of Food in the Last Year: Not on file  Transportation Needs:   . Lack of Transportation (Medical): Not on file  . Lack of Transportation (Non-Medical): Not on file  Physical Activity:   . Days of Exercise per Week: Not on file  . Minutes of Exercise per Session: Not on file   Stress:   . Feeling of Stress : Not on file  Social Connections:   . Frequency of Communication with Friends and Family: Not on file  . Frequency of Social Gatherings with Friends and Family: Not on file  . Attends Religious Services: Not on file  . Active Member of Clubs or Organizations: Not on file  . Attends Archivist Meetings: Not on file  . Marital Status: Not on file  Intimate Partner Violence:   . Fear of Current or Ex-Partner: Not on file  . Emotionally Abused: Not on file  . Physically Abused: Not on file  . Sexually Abused: Not on file     Physical Exam: Pulse 71   Ht 6\' 2"  (1.88 m)   Wt 266 lb 2 oz (120.7 kg)   BMI 34.17 kg/m  Constitutional: generally well-appearing Psychiatric: alert and oriented x3 Abdomen: soft, nontender, nondistended, no obvious ascites, no peritoneal signs, normal bowel sounds No peripheral edema noted in lower extremities  Assessment and plan: 64 y.o. male with cirrhosis, multiple comorbid conditions, on blood thinners chronically for atrial fibrillation  He has a lot of ill feelings towards several of his medical encounters recently.  He seems to  say this in a joking manner but I do see that he signed out [place] 3 or 4 weeks ago from the emergency room.  Overall from a GI perspective I think he is overall stable.  He has multiple comorbid conditions and certainly his cirrhosis makes his health overall quite a bit worse.  As far as his mild iron deficiency anemia and his intermittent rectal bleeding I do not think he needs any further work-up.  Attention should be made to try to keep his Coumadin level from getting to a supratherapeutic.  He will return to see me in 3 months with CBC, coags, complete metabolic profile shortly prior to then  Please see the "Patient Instructions" section for addition details about the plan.  Owens Loffler, MD [facility] 01/06/2020, 8:23 AM   Total time on date of encounter was 30  minutes (this included time spent preparing to see the patient reviewing records; obtaining and/or reviewing separately obtained history; performing a medically appropriate exam and/or evaluation; counseling and educating the patient and family if present; ordering medications, tests or procedures if applicable; and documenting clinical information in the health record).

## 2020-01-06 NOTE — Patient Instructions (Signed)
If you are age 62 or older, your body mass index should be between 23-30. Your Body mass index is 34.17 kg/m. If this is out of the aforementioned range listed, please consider follow up with your Primary Care Provider.  If you are age 62 or younger, your body mass index should be between 19-25. Your Body mass index is 34.17 kg/m. If this is out of the aformentioned range listed, please consider follow up with your Primary Care Provider.    Your provider has requested that you go to the basement level for lab work in 3 months (mid November). Press "B" on the elevator. The lab is located at the first door on the left as you exit the elevator.  Due to recent changes in healthcare laws, you may see the results of your imaging and laboratory studies on MyChart before your provider has had a chance to review them.  We understand that in some cases there may be results that are confusing or concerning to you. Not all laboratory results come back in the same time frame and the provider may be waiting for multiple results in order to interpret others.  Please give [place] 48 hours in order for your provider to thoroughly review all the results before contacting the office for clarification of your results.   You will be seen in the office for follow up appointment in November after your lab work.  Thank you for entrusting me with your care and choosing [facility].  Dr Ardis Hughs

## 2020-01-07 ENCOUNTER — Encounter: Payer: Self-pay | Admitting: Podiatry

## 2020-01-07 ENCOUNTER — Ambulatory Visit (INDEPENDENT_AMBULATORY_CARE_PROVIDER_SITE_OTHER): Payer: 59 | Admitting: Podiatry

## 2020-01-07 ENCOUNTER — Other Ambulatory Visit: Payer: Self-pay

## 2020-01-07 DIAGNOSIS — E0843 Diabetes mellitus due to underlying condition with diabetic autonomic (poly)neuropathy: Secondary | ICD-10-CM | POA: Diagnosis not present

## 2020-01-07 DIAGNOSIS — M79676 Pain in unspecified toe(s): Secondary | ICD-10-CM | POA: Diagnosis not present

## 2020-01-07 DIAGNOSIS — Z7901 Long term (current) use of anticoagulants: Secondary | ICD-10-CM | POA: Diagnosis not present

## 2020-01-07 DIAGNOSIS — B351 Tinea unguium: Secondary | ICD-10-CM

## 2020-01-07 NOTE — Progress Notes (Signed)
This patient returns to my office for at risk foot care.  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   Patient is taking coumadin.  This patient is unable to cut nails himself since the patient cannot reach his nails.These nails are painful walking and wearing shoes.  This patient presents for at risk foot care today. Patient states he has pain in feet due to neuropathy.  General Appearance  Alert, conversant and in no acute stress.  Vascular  Dorsalis pedis and posterior tibial  pulses are palpable  bilaterally.  Capillary return is within normal limits  bilaterally. Temperature is within normal limits  bilaterally.  Neurologic  Senn-Weinstein monofilament wire test absent  bilaterally. Muscle power within normal limits bilaterally.  Nails Thick disfigured discolored nails with subungual debris  from second  to fifth toes bilaterally. No evidence of bacterial infection or drainage bilaterally.  Orthopedic  No limitations of motion  feet .  No crepitus or effusions noted.  No bony pathology or digital deformities noted.  Skin  normotropic skin with no porokeratosis noted bilaterally.  No signs of infections or ulcers noted.     Onychomycosis  Pain in right toes  Pain in left toes  Consent was obtained for treatment procedures.   Mechanical debridement of nails 1-5  bilaterally performed with a nail nipper.  Filed with dremel without incident.    Return office visit     3 months                 Told patient to return for periodic foot care and evaluation due to potential at risk complications.   Gardiner Barefoot DPM

## 2020-01-13 NOTE — Telephone Encounter (Signed)
See 8/6 phone note

## 2020-01-21 ENCOUNTER — Telehealth: Payer: Self-pay | Admitting: Endocrinology

## 2020-01-21 ENCOUNTER — Telehealth: Payer: Self-pay | Admitting: Cardiology

## 2020-01-21 ENCOUNTER — Other Ambulatory Visit: Payer: Self-pay

## 2020-01-21 MED ORDER — BD PEN NEEDLE MINI U/F 31G X 5 MM MISC: 1.0000 | Freq: Three times a day (TID) | 3 refills | Status: DC

## 2020-01-21 NOTE — Telephone Encounter (Signed)
Patient called stating he needs his Rx for the pen needles to say  "inject 3x daily" not two. Asked if we could please resend Rx.   [facility] [place] - [address] Phone:  [phone]  Fax:  [phone]

## 2020-01-21 NOTE — Telephone Encounter (Signed)
Patient is calling about leg pain he has had since he has his MRI on 8/2.  He states the contrast has caused the leg pain.  He wants to know what kind of doctor he needs to see.  He said it's muscle pain and weakness he is having in his legs.

## 2020-01-21 NOTE — Telephone Encounter (Signed)
Rx corrected and resent.  

## 2020-01-21 NOTE — Telephone Encounter (Signed)
Spoke to patient he stated since he had coronary ct on 8/2 he has been having pain in both legs.Stated legs were weak before but now he has pain every day.He takes Hydrocodone with some relief.Appointment scheduled with [name] PA 9/10 at 1:45 pm.Advised I will make Dr.Hochrein aware.

## 2020-01-23 ENCOUNTER — Ambulatory Visit: Payer: 59 | Admitting: Endocrinology

## 2020-01-24 ENCOUNTER — Encounter: Payer: Self-pay | Admitting: Physician Assistant

## 2020-01-24 ENCOUNTER — Other Ambulatory Visit: Payer: Self-pay

## 2020-01-24 ENCOUNTER — Ambulatory Visit (INDEPENDENT_AMBULATORY_CARE_PROVIDER_SITE_OTHER): Payer: 59 | Admitting: Physician Assistant

## 2020-01-24 VITALS — BP 122/85 | HR 67 | Ht 74.0 in | Wt 270.2 lb

## 2020-01-24 DIAGNOSIS — I10 Essential (primary) hypertension: Secondary | ICD-10-CM

## 2020-01-24 DIAGNOSIS — J449 Chronic obstructive pulmonary disease, unspecified: Secondary | ICD-10-CM

## 2020-01-24 DIAGNOSIS — M79605 Pain in left leg: Secondary | ICD-10-CM

## 2020-01-24 DIAGNOSIS — E119 Type 2 diabetes mellitus without complications: Secondary | ICD-10-CM

## 2020-01-24 DIAGNOSIS — M79604 Pain in right leg: Secondary | ICD-10-CM | POA: Diagnosis not present

## 2020-01-24 DIAGNOSIS — I4892 Unspecified atrial flutter: Secondary | ICD-10-CM

## 2020-01-24 DIAGNOSIS — I480 Paroxysmal atrial fibrillation: Secondary | ICD-10-CM

## 2020-01-24 DIAGNOSIS — E785 Hyperlipidemia, unspecified: Secondary | ICD-10-CM

## 2020-01-24 NOTE — Patient Instructions (Addendum)
Medication Instructions:  Your physician recommends that you continue on your current medications as directed. Please refer to the Current Medication list given to you today.  *If you need a refill on your cardiac medications before your next appointment, please call your pharmacy*  Lab Work: Your physician recommends that you return for lab work TODAY:   BMET  Total CK If you have labs (blood work) drawn today and your tests are completely normal, you will receive your results only by:  [place] (if you have MyChart) OR  A paper copy in the mail If you have any lab test that is abnormal or we need to change your treatment, we will call you to review the results.  Testing/Procedures: Your physician has requested that you have an ankle brachial index (ABI). During this test an ultrasound and blood pressure cuff are used to evaluate the arteries that supply the arms and legs with blood. Allow thirty minutes for this exam. There are no restrictions or special instructions.   Please schedule for 2 weeks   Your physician has requested that you have a lower or upper extremity arterial duplex. This test is an ultrasound of the arteries in the legs or arms. It looks at arterial blood flow in the legs and arms. Allow one hour for Lower and Upper Arterial scans. There are no restrictions or special instructions   Please schedule for 2 weeks   Follow-Up: At [facility], you and your health needs are our priority.  As part of our continuing mission to provide you with exceptional heart care, we have created designated Provider Care Teams.  These Care Teams include your primary Cardiologist (physician) and Advanced Practice Providers (APPs -  Physician Assistants and Nurse Practitioners) who all work together to provide you with the care you need, when you need it.  Your next appointment:    Follow up as scheduled 03/09/2020 at 10:40 AM   The format for your next appointment:   In  Person  Provider:   Minus Breeding, MD  Other Instructions

## 2020-01-24 NOTE — Progress Notes (Signed)
Cardiology Office Note:    Date:  01/26/2020   ID:  [name], DOB [birth year], MRN [id number]  PCP:  Timoteo Gaul, FNP  [facility] Cardiologist:  Minus Breeding, MD  [place] Electrophysiologist:  None   Referring MD: Timoteo Gaul, FNP   Chief Complaint  Patient presents with  . Follow-up    seen for Dr. Percival Spanish    History of Present Illness:    [name] is a 64 y.o. male with a hx of COPD/emphysema, HTN, HLD, DM II, obesity, OSA intolerant of CPAP, history of tobacco abuse and history of atrial fibrillation and atrial flutter. Patient had atrial flutter ablation in 2014. Since he has both paroxysmal atrial fibrillation and atrial flutter, he underwent EP study with a second ablation on 04/21/2015. Cardiac catheterization in May 2018 showed widely patent coronary arteries with normal EF, moderate LVH. Echocardiogram obtained on 08/14/2019 showed EF 60 to 65%, moderate LVH, grade 2 DD, moderately elevated pulmonary artery systolic pressure, RVSP 51.8 mmHg, severe left atrial enlargement, moderate right atrial enlargement, mild MR, mild to moderate TR. Carotid Doppler obtained on 09/30/2019 showed 1 to 39% disease in bilateral internal carotid artery. Patient was last seen by Dr. Percival Spanish on 11/08/2019 for shortness of breath. Cardiac MRI performed on 12/16/2019 showed EF 66%, moderate concentric LVH, no myocardial LGE to suggest prior MI, infiltrative cardiac disease or myocarditis. No evidence of cardiac amyloidosis.  Patient presents today for evaluation of leg pain that started after the cardiac MRI. He says he had a extremely bad experience during the cardiac MRI. He was given a dose of Xanax on top of his pain medication. While he was laying in the MRI, he had a feeling of impending doom and asked to be taken off of the MRI table. However he realized there was nobody in the control room at the time. He was also quite confused at the time of cardiac MRI as well.  After he came home, he felt so bad he called 911 and went to the ED. According to ED physician's note, he demanded hydrocodone or threatening to leave, however was offered non-narcotic alternative to the back pain. He refused this and did not wish to stay for further work-up. Since then, he had progressively worsening upper thigh pain that occurs both at rest and with exertion. His upper thigh is tender to touch. He does not have any sensation below the shin area due to diabetic neuropathy. My suspicion for arterial occlusion is fairly low, however recommend ABI with arterial Doppler to be sure there was not no other reason to cause his symptoms. Heart rate on exam was quite regular suggesting sinus rhythm. We did not get a EKG today. I discussed with DOD Dr. Harrell Gave, his upper thigh pain is more consistent with either myositis versus compressed nerve in the lower back. Myositis would be a very unlikely side effect from MRI contrast, we will obtain basic metabolic panel and total CK. As for his back issue, he was told previously by his neurosurgeon that he would not be a good candidate for intervention. I recommended he discuss this with his primary care doctor and have him referred to a pain specialist.  Past Medical History:  Diagnosis Date  . Arthritis    lower back  . Asthma   . Atrial flutter ([place])    s/p CTI ablation by Dr Rayann Heman  . Brain aneurysm 2009   questionable. A follow up CTA in 2009 showed no  evidence of  . Chronic back pain    DDD/stenosis  . Colon polyps    9 polyps removed 10/13/11  . Complication of anesthesia September 11, 2012   slow to awaken after ablation  . COPD (chronic obstructive pulmonary disease) ([place])   . Diabetes mellitus    takes Metformin and Glimepiride daily  . Emphysema   . GERD (gastroesophageal reflux disease)    takes Omeprazole bid  . History of shingles   . HLD (hyperlipidemia)    takes Pravastatin daily  . HTN (hypertension)    takes Prinizide  daily  . Obesity   . OSA (obstructive sleep apnea)    not always using cpap  . Overdose 2009   unintentional Flecanide overdose  . Peripheral neuropathy   . Short-term memory loss   . Tobacco abuse     Past Surgical History:  Procedure Laterality Date  . ANTERIOR CERVICAL DECOMP/DISCECTOMY FUSION  01/04/2012   Procedure: ANTERIOR CERVICAL DECOMPRESSION/DISCECTOMY FUSION 1 LEVEL/HARDWARE REMOVAL;  Surgeon: Ophelia Charter, MD;  Location: [place] [facility];  Service: Neurosurgery;  Laterality: N/A;  explore cervical fusion Cervical six - seven  with removal of codman plate anterior cervical decompression with fusion interbody prothesis plating and bonegraft  . APPENDECTOMY  2-46yrs ago  . ATRIAL FIBRILLATION ABLATION N/A 09/11/2012   PT DID NOT HAVE AN ATRIAL FIBRILLATION ABLATION IN 2014!  ATRIAL FLUTTER ABLATION ONLY  . ATRIAL FLUTTER ABLATION  09/11/2012   CTI ablation by Dr Rayann Heman  . BIOPSY  11/07/2019   Procedure: BIOPSY;  Surgeon: Milus Banister, MD;  Location: [facility];  Service: Endoscopy;;  . CARDIAC CATHETERIZATION  2008   no significant CAD  . CARDIOVERSION  05/05/2011   Procedure: CARDIOVERSION;  Surgeon: Lelon Perla, MD;  [facility] END[facility];  Service: Cardiovascular;  Laterality: N/A;  . CARDIOVERSION Bilateral 07/26/2012   Procedure: CARDIOVERSION;  Surgeon: Minus Breeding, MD;  Location[facility] END[facility]COPY;  Service: Cardiovascular;  Laterality: Bilateral;  . CARPAL TUNNEL RELEASE  99/2000   bilateral  . COLONOSCOPY WITH PROPOFOL N/A 12/13/2012   Procedure: COLONOSCOPY WITH PROPOFOL;  Surgeon: Milus Banister, MD;  Location: [facility] ENDOSCOPY;  Service: Endoscopy;  Laterality: N/A;  . ELECTROPHYSIOLOGIC STUDY N/A 04/21/2015   Procedure: Atrial Fibrillation Ablation;  Surgeon: Thompson Grayer, MD;  Location: [place] CV LAB;  Service: Cardiovascular;  Laterality: N/A;  . ESOPHAGOGASTRODUODENOSCOPY (EGD) WITH PROPOFOL N/A 11/07/2019   Procedure: ESOPHAGOGASTRODUODENOSCOPY (EGD)  WITH PROPOFOL;  Surgeon: Milus Banister, MD;  Location: [facility] ENDOSCOPY;  Service: Endoscopy;  Laterality: N/A;  . HERNIA REPAIR    . KNEE SURGERY  6-38yrs ago   left  . LEFT HEART CATH AND CORONARY ANGIOGRAPHY N/A 09/19/2016   Procedure: Left Heart Cath and Coronary Angiography;  Surgeon: Belva Crome, MD;  Location: [place] [facility] LAB;  Service: Cardiovascular;  Laterality: N/A;  . Left inguinal hernia repair     as a child  . NASAL SEPTOPLASTY W/ TURBINOPLASTY Bilateral 02/19/2014   Procedure: NASAL SEPTOPLASTY WITH BILATERAL TURBINATE REDUCTION;  Surgeon: Jodi Marble, MD;  Location: [place];  Service: ENT;  Laterality: Bilateral;  . NECK SURGERY  75yrs ago  . right shoulder surgery  4-64yrs ago   cyst removed  . TEE WITHOUT CARDIOVERSION  05/05/2011   Procedure: TRANSESOPHAGEAL ECHOCARDIOGRAM (TEE);  Surgeon: Lelon Perla, MD;  Location: [facility] ENDOSCOPY;  Service: Cardiovascular;  Laterality: N/A;  . TEE WITHOUT CARDIOVERSION N/A 04/21/2015   Procedure: TRANSESOPHAGEAL ECHOCARDIOGRAM (TEE);  Surgeon: Tressia Miners  Remonia Richter, MD;  Location: [place];  Service: Cardiovascular;  Laterality: N/A;  . THROAT SURGERY  4-18yrs ago   "thought " it was cancer but came back not  . TONSILLECTOMY      Current Medications: Current Meds  Medication Sig  . albuterol (PROAIR HFA) 108 (90 Base) MCG/ACT inhaler Inhale 1-2 puffs into the lungs every 6 (six) hours as needed for wheezing or shortness of breath.  . Ascorbic Acid (VITAMIN C PO) Take 1,000 mg by mouth daily.   [name] b complex vitamins tablet Take 1 tablet by mouth daily.   . B-D UF III MINI PEN NEEDLES 31G X 5 MM MISC 1 Device by Other route 3 (three) times daily with meals. Use three times daily to inject insulin.  . bisoprolol (ZEBETA) 10 MG tablet Take 10 mg by mouth daily.   [name] diltiazem (TIAZAC) 120 MG 24 hr capsule Take 1 capsule by mouth daily.   . ferrous sulfate 324 MG TBEC Take 324 mg by mouth daily.   [name] gabapentin (NEURONTIN) 300 MG capsule  Take by mouth.  [name] HYDROcodone-acetaminophen (NORCO) 7.5-325 MG tablet Take 1 tablet by mouth 4 (four) times daily. Patient is taking 10 mg  . insulin aspart (NOVOLOG FLEXPEN) 100 UNIT/ML FlexPen Inject 80 Units into the skin 3 (three) times daily with meals. E11.9  . levocetirizine (XYZAL) 5 MG tablet Take 5 mg by mouth every evening.   [name] losartan (COZAAR) 50 MG tablet Take 1 tablet (50 mg total) by mouth daily.  . metFORMIN (GLUCOPHAGE-XR) 500 MG 24 hr tablet Take 1 tablet (500 mg total) by mouth at bedtime.  . metolazone (ZAROXOLYN) 2.5 MG tablet TAKE 1 TABLET ON Tuesday AND Thursday 30 MINUTES BEFORE TAKING TORSEMIDE OR AS DIRECTED  . Multiple Vitamin (MULTIVITAMIN WITH MINERALS) TABS Take 1 tablet by mouth daily.   . Multiple Vitamins-Minerals (ZINC PO) Take 50 mg by mouth daily.   . nitroGLYCERIN (NITROSTAT) 0.4 MG SL tablet Place 1 tablet (0.4 mg total) under the tongue every 5 (five) minutes x 3 doses as needed for chest pain.  . pantoprazole (PROTONIX) 20 MG tablet Take 20 mg by mouth 2 (two) times daily.   . potassium chloride SA (KLOR-CON M20) 20 MEQ tablet Take 1 tablet (20 mEq total) by mouth daily. TAKE AN EXTRA TABLET ON Tuesday AND Thursday  . pravastatin (PRAVACHOL) 80 MG tablet TAKE 1 TABLET BY MOUTH DAILY AT BEDTIME  . sildenafil (REVATIO) 20 MG tablet Take 100 mg by mouth daily as needed (erectile dysfunction).   . torsemide (DEMADEX) 20 MG tablet TAKE 2 TABLETS BY MOUTH 2 TIMES DAILY  . triamcinolone cream (KENALOG) 0.1 % Apply 1 application topically daily as needed.  . umeclidinium-vilanterol (ANORO ELLIPTA) 62.5-25 MCG/INH AEPB Inhale 1 puff into the lungs daily.  [name] VITAMIN A PO Take 2,400 mcg by mouth daily.   . Vitamin D, Ergocalciferol, (DRISDOL) 1.25 MG (50000 UT) CAPS capsule Take 50,000 Units by mouth every 7 (seven) days.   [name] warfarin (COUMADIN) 5 MG tablet Take 2.5-5 mg by mouth See admin instructions. Tale 2.5 mg on Monday Take 5 mg every other day      Allergies:   Adhesive [tape] and Latex   Social History   Socioeconomic History  . Marital status: Married    Spouse name: Not on file  . Number of children: 2  . Years of education: Not on file  . Highest education level: Not on file  Occupational History  .  Occupation: Dealer   Tobacco Use  . Smoking status: Current Every Day Smoker    Packs/day: 1.00    Years: 49.00    Pack years: 49.00    Types: Cigarettes  . Smokeless tobacco: Never Used  . Tobacco comment: 1 pack per day 12/02/19 [facility]   Vaping Use  . Vaping Use: Never used  Substance and Sexual Activity  . Alcohol use: Not Currently    Alcohol/week: 0.0 standard drinks    Comment: 12/05/17 pt stated he has drinked 1/2 drink in 3 years  . Drug use: No  . Sexual activity: Yes  Other Topics Concern  . Not on file  Social History Narrative   Daily caffeine(Mountain Dew)       Lives in [place] with spouse.   Unemployed due to chronic back/ leg pain         Social Determinants of Health   Financial Resource Strain:   . Difficulty of Paying Living Expenses: Not on file  Food Insecurity:   . Worried About Charity fundraiser in the Last Year: Not on file  . Ran Out of Food in the Last Year: Not on file  Transportation Needs:   . Lack of Transportation (Medical): Not on file  . Lack of Transportation (Non-Medical): Not on file  Physical Activity:   . Days of Exercise per Week: Not on file  . Minutes of Exercise per Session: Not on file  Stress:   . Feeling of Stress : Not on file  Social Connections:   . Frequency of Communication with Friends and Family: Not on file  . Frequency of Social Gatherings with Friends and Family: Not on file  . Attends Religious Services: Not on file  . Active Member of Clubs or Organizations: Not on file  . Attends Archivist Meetings: Not on file  . Marital Status: Not on file     Family History: The patient's family history includes Diabetes in his  maternal grandmother and mother; Heart disease in his father; Lung cancer in his father. There is no history of Colon cancer, Anesthesia problems, Hypotension, Malignant hyperthermia, Pseudochol deficiency, Esophageal cancer, Pancreatic cancer, or Stomach cancer.  ROS:   Please see the history of present illness.     All other systems reviewed and are negative.  EKGs/Labs/Other Studies Reviewed:    The following studies were reviewed today:  Echo 08/14/2019 1. Left ventricular ejection fraction, by estimation, is 60 to 65%. The  left ventricle has normal function. The left ventricle has no regional  wall motion abnormalities. There is moderate concentric left ventricular  hypertrophy. Left ventricular  diastolic parameters are consistent with Grade II diastolic dysfunction  (pseudonormalization). Elevated left atrial pressure.  2. Right ventricular systolic function is normal. The right ventricular  size is normal. There is moderately elevated pulmonary artery systolic  pressure. The estimated right ventricular systolic pressure is 17.6 mmHg.  3. Left atrial size was severely dilated.  4. Right atrial size was moderately dilated.  5. The mitral valve is normal in structure. Mild mitral valve  regurgitation. No evidence of mitral stenosis.  6. Tricuspid valve regurgitation is mild to moderate.  7. The aortic valve is tricuspid. Aortic valve regurgitation is not  visualized. Mild to moderate aortic valve sclerosis/calcification is  present, without any evidence of aortic stenosis.  8. The inferior vena cava is dilated in size with >50% respiratory  variability, suggesting right atrial pressure of 8 mmHg.   EKG:  EKG is not ordered today.   Recent Labs: 07/29/2019: TSH 4.330 09/16/2019: BNP 61.2 12/16/2019: ALT 25; BUN 22; Creatinine, Ser 1.23; Hemoglobin 10.9; Platelets 111; Potassium 3.7; Sodium 144  Recent Lipid Panel    Component Value Date/Time   CHOL 122 08/18/2017 0910    TRIG 400 (H) 08/18/2017 0910   HDL 29 (L) 08/18/2017 0910   CHOLHDL 4.2 08/18/2017 0910   CHOLHDL 5.3 08/27/2016 0235   VLDL 66 (H) 08/27/2016 0235   LDLCALC 13 08/18/2017 0910   LDLDIRECT 99.9 09/08/2011 0843    Physical Exam:    VS:  BP 122/85   Pulse 67   Ht 6\' 2"  (1.88 m)   Wt 270 lb 3.2 oz (122.6 kg)   SpO2 95%   BMI 34.69 kg/m     Wt Readings from Last 3 Encounters:  01/24/20 270 lb 3.2 oz (122.6 kg)  01/06/20 266 lb 2 oz (120.7 kg)  12/10/19 (!) 267 lb 12.8 oz (121.5 kg)     GEN:  Well nourished, well developed in no acute distress HEENT: Normal NECK: No JVD; No carotid bruits LYMPHATICS: No lymphadenopathy CARDIAC: RRR, no murmurs, rubs, gallops RESPIRATORY:  Clear to auscultation without rales, wheezing or rhonchi  ABDOMEN: Soft, non-tender, non-distended MUSCULOSKELETAL:  No edema; No deformity  SKIN: Warm and dry NEUROLOGIC:  Alert and oriented x 3 PSYCHIATRIC:  Normal affect   ASSESSMENT:    1. Pain in both lower extremities   2. PAF (paroxysmal atrial fibrillation) ([place])   3. Atrial flutter, unspecified type ([place])   4. Essential hypertension   5. Hyperlipidemia LDL goal <70   6. Controlled type 2 diabetes mellitus without complication, without long-term current use of insulin ([place])   7. Chronic obstructive pulmonary disease, unspecified COPD type ([place])    PLAN:    In order of problems listed above:  1. Bilateral lower extremity pain: He says he has chronic back pain however started having worsening upper thigh pain since the recent MRI.  He sought medical attention at local ED, however left when the ED physician recommended non-narcotic alternative to help relieve his pain.  He attributed the leg pain due to recent cardiac MRI.  I discussed the case with DOD Dr. Harrell Gave, this could either be myositis versus compressed nerve from back issue after laying on the MRI table.  We will obtain a total CK to make sure there is no sign of muscle injury.  I  also recommended ABI to make sure there is good arterial blood flow.  On exam, he has weak dorsalis pedis pulse.  Otherwise I recommend he follow-up with his primary care doctor.  He has a neurosurgeon in the past, however they told him that surgery is unlikely to help relieve his back pain and also his cardiac risk is prohibitive.  2. PAF/atrial flutter: Patient has both atrial fibrillation and atrial flutter.  He had atrial flutter ablation in 2014 and A. fib and a flutter ablation again in 2016.  3. Hypertension: Blood pressure stable on current therapy  4. Hyperlipidemia: On pravastatin  5. DM2: Managed by primary care provider  6. COPD: Stable.   Medication Adjustments/Labs and Tests Ordered: Current medicines are reviewed at length with the patient today.  Concerns regarding medicines are outlined above.  Orders Placed This Encounter  Procedures  . CK total and CKMB (cardiac)not at [facility]  . VAS [place] ABI WITH/WO TBI  . VAS [place] LOWER EXTREMITY ARTERIAL DUPLEX   No orders of the defined  types were placed in this encounter.   Patient Instructions  Medication Instructions:  Your physician recommends that you continue on your current medications as directed. Please refer to the Current Medication list given to you today.  *If you need a refill on your cardiac medications before your next appointment, please call your pharmacy*  Lab Work: Your physician recommends that you return for lab work TODAY:   BMET  Total CK If you have labs (blood work) drawn today and your tests are completely normal, you will receive your results only by: [name] MyChart Message (if you have MyChart) OR . A paper copy in the mail If you have any lab test that is abnormal or we need to change your treatment, we will call you to review the results.  Testing/Procedures: Your physician has requested that you have an ankle brachial index (ABI). During this test an ultrasound and blood pressure cuff are used to  evaluate the arteries that supply the arms and legs with blood. Allow thirty minutes for this exam. There are no restrictions or special instructions.   Please schedule for 2 weeks   Your physician has requested that you have a lower or upper extremity arterial duplex. This test is an ultrasound of the arteries in the legs or arms. It looks at arterial blood flow in the legs and arms. Allow one hour for Lower and Upper Arterial scans. There are no restrictions or special instructions   Please schedule for 2 weeks   Follow-Up: At [facility], you and your health needs are our priority.  As part of our continuing mission to provide you with exceptional heart care, we have created designated Provider Care Teams.  These Care Teams include your primary Cardiologist (physician) and Advanced Practice Providers (APPs -  Physician Assistants and Nurse Practitioners) who all work together to provide you with the care you need, when you need it.  Your next appointment:    Follow up as scheduled 03/09/2020 at 10:40 AM   The format for your next appointment:   In Person  Provider:   Minus Breeding, MD  Other Instructions      Signed, [name], [place]  01/26/2020 11:36 PM    [place]

## 2020-01-25 LAB — CK TOTAL AND CKMB (NOT AT ARMC)
CK-MB Index: 1.8 ng/mL (ref 0.0–10.4)
Total CK: 38 U/L — ABNORMAL LOW (ref 41–331)

## 2020-01-25 NOTE — Progress Notes (Signed)
Total CK negative for any muscle breakdown, this is reassuring

## 2020-01-26 ENCOUNTER — Encounter: Payer: Self-pay | Admitting: Physician Assistant

## 2020-01-27 NOTE — Telephone Encounter (Signed)
Patient has been seen in office since this encounter

## 2020-01-31 ENCOUNTER — Other Ambulatory Visit: Payer: Self-pay | Admitting: Physician Assistant

## 2020-01-31 DIAGNOSIS — G629 Polyneuropathy, unspecified: Secondary | ICD-10-CM

## 2020-01-31 DIAGNOSIS — Z87891 Personal history of nicotine dependence: Secondary | ICD-10-CM

## 2020-01-31 DIAGNOSIS — M79605 Pain in left leg: Secondary | ICD-10-CM

## 2020-01-31 DIAGNOSIS — F17200 Nicotine dependence, unspecified, uncomplicated: Secondary | ICD-10-CM

## 2020-02-03 ENCOUNTER — Other Ambulatory Visit: Payer: Self-pay

## 2020-02-03 ENCOUNTER — Ambulatory Visit (INDEPENDENT_AMBULATORY_CARE_PROVIDER_SITE_OTHER): Payer: 59

## 2020-02-03 DIAGNOSIS — Z7901 Long term (current) use of anticoagulants: Secondary | ICD-10-CM | POA: Diagnosis not present

## 2020-02-03 DIAGNOSIS — I480 Paroxysmal atrial fibrillation: Secondary | ICD-10-CM

## 2020-02-03 DIAGNOSIS — Z5181 Encounter for therapeutic drug level monitoring: Secondary | ICD-10-CM | POA: Diagnosis not present

## 2020-02-03 LAB — POCT INR: INR: 2.6 (ref 2.0–3.0)

## 2020-02-03 NOTE — Patient Instructions (Signed)
Take 1 tablet daily except for 1/2 tablet every Monday. Repeat INR in 5 weeks

## 2020-02-06 ENCOUNTER — Other Ambulatory Visit: Payer: Self-pay

## 2020-02-06 ENCOUNTER — Ambulatory Visit (INDEPENDENT_AMBULATORY_CARE_PROVIDER_SITE_OTHER): Payer: 59 | Admitting: Specialist

## 2020-02-06 ENCOUNTER — Ambulatory Visit (INDEPENDENT_AMBULATORY_CARE_PROVIDER_SITE_OTHER): Payer: 59 | Admitting: Endocrinology

## 2020-02-06 ENCOUNTER — Encounter: Payer: Self-pay | Admitting: Specialist

## 2020-02-06 ENCOUNTER — Ambulatory Visit: Payer: Self-pay

## 2020-02-06 VITALS — BP 144/72 | HR 74 | Ht 75.0 in | Wt 269.6 lb

## 2020-02-06 VITALS — BP 120/64 | HR 64 | Ht 74.0 in | Wt 269.0 lb

## 2020-02-06 DIAGNOSIS — N183 Chronic kidney disease, stage 3 unspecified: Secondary | ICD-10-CM | POA: Diagnosis not present

## 2020-02-06 DIAGNOSIS — R29898 Other symptoms and signs involving the musculoskeletal system: Secondary | ICD-10-CM | POA: Diagnosis not present

## 2020-02-06 DIAGNOSIS — M4807 Spinal stenosis, lumbosacral region: Secondary | ICD-10-CM

## 2020-02-06 DIAGNOSIS — M545 Low back pain, unspecified: Secondary | ICD-10-CM

## 2020-02-06 DIAGNOSIS — Z794 Long term (current) use of insulin: Secondary | ICD-10-CM

## 2020-02-06 DIAGNOSIS — E1122 Type 2 diabetes mellitus with diabetic chronic kidney disease: Secondary | ICD-10-CM | POA: Diagnosis not present

## 2020-02-06 DIAGNOSIS — M48062 Spinal stenosis, lumbar region with neurogenic claudication: Secondary | ICD-10-CM | POA: Diagnosis not present

## 2020-02-06 DIAGNOSIS — E1142 Type 2 diabetes mellitus with diabetic polyneuropathy: Secondary | ICD-10-CM

## 2020-02-06 DIAGNOSIS — M5417 Radiculopathy, lumbosacral region: Secondary | ICD-10-CM

## 2020-02-06 MED ORDER — GABAPENTIN 300 MG PO CAPS: 300.0000 mg | ORAL_CAPSULE | Freq: Two times a day (BID) | ORAL | 3 refills | Status: DC

## 2020-02-06 NOTE — Patient Instructions (Addendum)
Avoid bending, stooping and avoid lifting weights greater than 10 lbs. Avoid prolong standing and walking. Avoid frequent bending and stooping  No lifting greater than 10 lbs. May use ice or moist heat for pain. Weight loss is of benefit. Handicap license is approved. MRI to assess for right L3 or L4 nerve compression. Amyotrophy of the femoral nerve may also cause similar pain pattern but clinically his finding suggest right lumbar radiculopathy Due to L3 or L4 nerve compression.     Hemp CBD capsules, amazon.com 5,000-7,000 mg per bottle, 60 capsules per bottle, take one capsule twice a day. Cane in the right hand to use with right leg weight bearing. Use cane or walker to prevent falls, a walker with a seat may allow to sit  When you are feeling weak.  Follow-Up Instructions: No follow-ups on file.

## 2020-02-06 NOTE — Progress Notes (Addendum)
Office Visit Note   Patient: [name]           Date of Birth: 1956-04-21           MRN: [id number] Visit Date: 02/06/2020              Requested by: Timoteo Gaul, [place],  [place] [address] PCP: Timoteo Gaul, FNP   Assessment & Plan: Visit Diagnoses:  1. Midline low back pain, unspecified chronicity, unspecified whether sciatica present   2. Lumbosacral radiculopathy   3. Spinal stenosis of lumbar region with neurogenic claudication   4. Weakness of right leg   5. Type 2 diabetes mellitus with diabetic polyneuropathy, with long-term current use of insulin (HCC)   6. Spinal stenosis of lumbosacral region     Plan: Avoid bending, stooping and avoid lifting weights greater than 10 lbs. Avoid prolong standing and walking. Avoid frequent bending and stooping  No lifting greater than 10 lbs. May use ice or moist heat for pain. Weight loss is of benefit. Handicap license is approved. MRI to assess for right L3 or L4 nerve compression. Amyotrophy of the femoral nerve may also cause similar pain pattern but clinically his finding suggest right lumbar radiculopathy Due to L3 or L4 nerve compression.     Hemp CBD capsules, amazon.com 5,000-7,000 mg per bottle, 60 capsules per bottle, take one capsule twice a day. Cane in the right hand to use with right leg weight bearing. Use cane or walker to prevent falls, a walker with a seat may allow to sit  When you are feeling weak.   Follow-Up Instructions: No follow-ups on file.   Orders:  Orders Placed This Encounter  Procedures  . XR Lumbar Spine 2-3 Views  . MR LUMBAR SPINE WO CONTRAST   Meds ordered this encounter  Medications  . gabapentin (NEURONTIN) 300 MG capsule    Sig: Take 1 capsule (300 mg total) by mouth 2 (two) times daily. History of CHF and cirrhosis and renal insufficiency requires less dose.    Dispense:  60 capsule    Refill:  3      Procedures: No procedures  performed   Clinical Data: No additional findings.   Subjective: Chief Complaint  Patient presents with  . Lower Back - Pain  . Right Leg - Pain  . Left Leg - Pain    64 year old male with history of diabetes and has history of afibrillation post ablation x 2 and cardiomyopathy sees Dr.Hochrein. He has had back pain and neurogenic claudication Symptoms for years but since early August 2021 with severe right anterior thigh pain with skin sensitivity. Has night pain unable to sleep more than 2 hours. He is not able to help with house  House hold duties. He has an insulin allergy and difficulty with several insulin types. Back pain became so severe that he had to stop repairing heavy equipment., tractors, back hoes, and  Other heavy equipment. In the past he did marine tug boat maintenance. There is right leg weakness and he has fallen several times when he is in one spot and twisting or turning the right > left leg  Gives away. Lives in one story home but has some stairs to get in and out of the house. He tries to control his falls. He has constipation and trouble getting started. Once in a while he has difficulty  Telling when he is done. Taking 4 fluid pills  per day for his heart, for CHF, Dr. Percival Spanish. He reports having trouble with breast pain and enlargement post taking his last covid injection #2.  Not able to take steroid due to diabetes and there is a renal insufficiency and cirrhosis. Taking gabapentin for pain and hydrocodone 10 mg tablet 4 times per day.     Review of Systems  Constitutional: Positive for activity change. Negative for appetite change, chills, diaphoresis, fatigue, fever and unexpected weight change.  HENT: Positive for hearing loss and tinnitus. Negative for congestion, dental problem, drooling, ear discharge, ear pain, facial swelling, mouth sores, nosebleeds, postnasal drip, rhinorrhea, sinus pressure, sinus pain, sneezing, sore throat, trouble swallowing and  voice change.   Eyes: Positive for visual disturbance (near sighted worsening, some diabetes changes). Negative for photophobia, pain, discharge, redness and itching.  Respiratory: Positive for apnea, cough, shortness of breath and wheezing. Negative for choking, chest tightness and stridor.   Cardiovascular: Negative for chest pain, palpitations and leg swelling.  Gastrointestinal: Positive for abdominal pain and constipation. Negative for abdominal distention, anal bleeding, blood in stool, diarrhea, nausea, rectal pain and vomiting.  Endocrine: Negative for cold intolerance, heat intolerance, polydipsia, polyphagia and polyuria.  Genitourinary: Negative for difficulty urinating, dysuria, enuresis, flank pain, frequency, genital sores and hematuria.  Musculoskeletal: Positive for back pain and gait problem. Negative for arthralgias, joint swelling, myalgias, neck pain and neck stiffness.  Skin: Negative for color change, pallor, rash and wound.  Allergic/Immunologic: Negative for environmental allergies, food allergies and immunocompromised state.  Neurological: Positive for weakness and numbness. Negative for dizziness, tremors, seizures, syncope, facial asymmetry, speech difficulty, light-headedness and headaches.  Hematological: Negative for adenopathy. Does not bruise/bleed easily.  Psychiatric/Behavioral: Negative for agitation, behavioral problems, confusion, decreased concentration, dysphoric mood, hallucinations, self-injury, sleep disturbance and suicidal ideas. The patient is not nervous/anxious and is not hyperactive.      Objective: Vital Signs: BP (!) 144/72 (BP Location: Left Arm, Patient Position: Sitting)   Pulse 74   Ht 6\' 3"  (1.905 m)   Wt 269 lb 9.6 oz (122.3 kg)   BMI 33.70 kg/m   Physical Exam Constitutional:      Appearance: He is well-developed.  HENT:     Head: Normocephalic and atraumatic.  Eyes:     Pupils: Pupils are equal, round, and reactive to light.   Pulmonary:     Effort: Pulmonary effort is normal.     Breath sounds: Normal breath sounds.  Abdominal:     General: Bowel sounds are normal.     Palpations: Abdomen is soft.  Musculoskeletal:     Cervical back: Normal range of motion and neck supple.     Lumbar back: Negative right straight leg raise test and negative left straight leg raise test.  Skin:    General: Skin is warm and dry.  Neurological:     Mental Status: He is alert and oriented to person, place, and time.  Psychiatric:        Behavior: Behavior normal.        Thought Content: Thought content normal.        Judgment: Judgment normal.     Back Exam   Tenderness  The patient is experiencing tenderness in the lumbar.  Range of Motion  Extension: abnormal  Flexion: abnormal  Lateral bend right: abnormal  Lateral bend left: abnormal  Rotation right: abnormal  Rotation left: abnormal   Muscle Strength  Right Quadriceps:  4/5  Left Quadriceps:  5/5  Right Hamstrings:  5/5  Left Hamstrings:  5/5   Tests  Straight leg raise right: negative Straight leg raise left: negative  Reflexes  Patellar: 0/4 Achilles: 0/4 Babinski's sign: normal   Other  Toe walk: abnormal Heel walk: abnormal Sensation: normal Gait: abnormal  Erythema: no back redness Scars: absent  Comments:  Atrophy right thigh one inch      Specialty Comments:  No specialty comments available.  Imaging: No results found.   PMFS History: Patient Active Problem List   Diagnosis Date Noted  . LVH (left ventricular hypertrophy) 11/07/2019  . Heme positive stool 10/03/2019  . Anemia 10/03/2019  . Insulin dependent type 2 diabetes mellitus ([place]) 08/26/2019  . Acute on chronic diastolic heart failure ([place]) 08/26/2019  . Rash and nonspecific skin eruption 08/26/2019  . Foot pain 08/22/2019  . Right heart failure ([place]) 08/07/2019  . Leg swelling 07/28/2019  . Educated about COVID-19 virus infection 07/28/2019  . Diabetes  ([place]) 06/12/2019  . Urticaria 05/15/2019  . Dermatitis due to drug 04/18/2019  . Edema 03/29/2019  . Erectile dysfunction 03/29/2019  . High risk medication use 03/29/2019  . History of iron deficiency 03/29/2019  . Pulmonary hypertension ([place]) 03/29/2019  . Venous stasis dermatitis of both lower extremities 03/29/2019  . Mixed hyperlipidemia 02/14/2019  . Onychomycosis due to dermatophyte 11/06/2018  . Hammer toe of right foot 07/17/2018  . Tinea pedis of both feet 07/17/2018  . History of radiofrequency ablation (RFA) procedure for cardiac arrhythmia 06/21/2018  . Lumbar back pain with radiculopathy affecting lower extremity 06/21/2018  . Hepatic cirrhosis ([place]) 12/26/2017  . Left lower quadrant pain 12/05/2017  . Change in bowel habits 12/05/2017  . Palpitations 10/03/2017  . SOB (shortness of breath) 04/12/2017  . Chest pain with moderate risk of acute coronary syndrome 08/28/2016  . Unstable angina ([place]) 08/26/2016  . Longstanding persistent atrial fibrillation ([place]) 04/21/2015  . Deviated nasal septum 02/19/2014  . History of colon polyps 11/22/2012  . Syncope 10/17/2012  . Acute asthmatic bronchitis 11/22/2011  . Benign neoplasm of colon 10/13/2011  . Unspecified gastritis and gastroduodenitis without mention of hemorrhage 10/13/2011  . GERD (gastroesophageal reflux disease) 10/13/2011  . Obesity (BMI 30-39.9) 09/30/2011  . Dyslipidemia 06/23/2011  . Neuropathy 06/23/2011  . Fatigue 05/13/2011  . Malaise and fatigue 05/13/2011  . Rectal bleeding 04/18/2011  . Atrial flutter ([place]) 04/18/2011  . Chronic anticoagulation 11/10/2010  . PAF (paroxysmal atrial fibrillation) ([place])   . Tobacco abuse   . COPD (chronic obstructive pulmonary disease) ([place]) 02/11/2010  . Essential hypertension 01/28/2010  . OSA (obstructive sleep apnea) 01/28/2010   Past Medical History:  Diagnosis Date  . Arthritis    lower back  . Asthma   . Atrial flutter ([place])    s/p CTI ablation by Dr  Rayann Heman  . Brain aneurysm 2009   questionable. A follow up CTA in 2009 showed no evidence of  . Chronic back pain    DDD/stenosis  . Colon polyps    9 polyps removed 10/13/11  . Complication of anesthesia September 11, 2012   slow to awaken after ablation  . COPD (chronic obstructive pulmonary disease) ([place])   . Diabetes mellitus    takes Metformin and Glimepiride daily  . Emphysema   . GERD (gastroesophageal reflux disease)    takes Omeprazole bid  . History of shingles   . HLD (hyperlipidemia)    takes Pravastatin daily  . HTN (hypertension)    takes Prinizide daily  . Obesity   .  OSA (obstructive sleep apnea)    not always using cpap  . Overdose 2009   unintentional Flecanide overdose  . Peripheral neuropathy   . Short-term memory loss   . Tobacco abuse     Family History  Problem Relation Age of Onset  . Diabetes Mother   . Heart disease Father   . Lung cancer Father   . Diabetes Maternal Grandmother   . Colon cancer Neg Hx   . Anesthesia problems Neg Hx   . Hypotension Neg Hx   . Malignant hyperthermia Neg Hx   . Pseudochol deficiency Neg Hx   . Esophageal cancer Neg Hx   . Pancreatic cancer Neg Hx   . Stomach cancer Neg Hx     Past Surgical History:  Procedure Laterality Date  . ANTERIOR CERVICAL DECOMP/DISCECTOMY FUSION  01/04/2012   Procedure: ANTERIOR CERVICAL DECOMPRESSION/DISCECTOMY FUSION 1 LEVEL/HARDWARE REMOVAL;  Surgeon: Ophelia Charter, MD;  Location: [facility];  Service: Neurosurgery;  Laterality: N/A;  explore cervical fusion Cervical six - seven  with removal of codman plate anterior cervical decompression with fusion interbody prothesis plating and bonegraft  . APPENDECTOMY  2-42yrs ago  . ATRIAL FIBRILLATION ABLATION N/A 09/11/2012   PT DID NOT HAVE AN ATRIAL FIBRILLATION ABLATION IN 2014!  ATRIAL FLUTTER ABLATION ONLY  . ATRIAL FLUTTER ABLATION  09/11/2012   CTI ablation by Dr Rayann Heman  . BIOPSY  11/07/2019   Procedure: BIOPSY;  Surgeon: Milus Banister, MD;  Location: [facility];  Service: Endoscopy;;  . CARDIAC CATHETERIZATION  2008   no significant CAD  . CARDIOVERSION  05/05/2011   Procedure: CARDIOVERSION;  Surgeon: Lelon Perla, MD;  Location: [facility] ENDOSCOPY;  Service: Cardiovascular;  Laterality: N/A;  . CARDIOVERSION Bilateral 07/26/2012   Procedure: CARDIOVERSION;  Surgeon: Minus Breeding, MD;  [facility];  Service: Cardiovascular;  Laterality: Bilateral;  . CARPAL TUNNEL RELEASE  99/2000   bilateral  . COLONOSCOPY WITH PROPOFOL N/A 12/13/2012   Procedure: COLONOSCOPY WITH PROPOFOL;  Surgeon: Milus Banister, MD;  Location: [facility]OS[facility];  Service: Endoscopy;  Laterality: N/A;  . ELECTROPHYSIOLOGIC STUDY N/A 04/21/2015   Procedure: Atrial Fibrillation Ablation;  Surgeon: Thompson Grayer, MD;  Location: [facility];  Service: Cardiovascular;  Laterality: N/A;  . ESOPHAGOGASTRODUODENOSCOPY (EGD) WITH PROPOFOL N/A 11/07/2019   Procedure: ESOPHAGOGASTRODUODENOSCOPY (EGD) WITH PROPOFOL;  Surgeon: Milus Banister, MD;  Location: [facility];  Service: Endoscopy;  Laterality: N/A;  . HERNIA REPAIR    . KNEE SURGERY  6-11yrs ago   left  . LEFT HEART CATH AND CORONARY ANGIOGRAPHY N/A 09/19/2016   Procedure: Left Heart Cath and Coronary Angiography;  Surgeon: Belva Crome, MD;  Location: [facility];  Service: Cardiovascular;  Laterality: N/A;  . Left inguinal hernia repair     as a child  . NASAL SEPTOPLASTY W/ TURBINOPLASTY Bilateral 02/19/2014   Procedure: NASAL SEPTOPLASTY WITH BILATERAL TURBINATE REDUCTION;  Surgeon: Jodi Marble, MD;  Location: [facility];  Service: ENT;  Laterality: Bilateral;  . NECK SURGERY  54yrs ago  . right shoulder surgery  4-4yrs ago   cyst removed  . TEE WITHOUT CARDIOVERSION  05/05/2011   Procedure: TRANSESOPHAGEAL ECHOCARDIOGRAM (TEE);  Surgeon: Lelon Perla, MD;  Location: [facility] ENDOSCOPY;  Service: Cardiovascular;  Laterality: N/A;  . TEE WITHOUT CARDIOVERSION N/A 04/21/2015    Procedure: TRANSESOPHAGEAL ECHOCARDIOGRAM (TEE);  Surgeon: Sueanne Margarita, MD;  Location: [facility];  Service: Cardiovascular;  Laterality: N/A;  . THROAT SURGERY  4-19yrs ago   "thought " it was cancer but came back not  . TONSILLECTOMY     Social History   Occupational History  . Occupation: Dealer   Tobacco Use  . Smoking status: Current Every Day Smoker    Packs/day: 1.00    Years: 49.00    Pack years: 49.00    Types: Cigarettes  . Smokeless tobacco: Never Used  . Tobacco comment: 1 pack per day 12/02/19 [facility]   Vaping Use  . Vaping Use: Never used  Substance and Sexual Activity  . Alcohol use: Not Currently    Alcohol/week: 0.0 standard drinks    Comment: 12/05/17 pt stated he has drinked 1/2 drink in 3 years  . Drug use: No  . Sexual activity: Yes

## 2020-02-06 NOTE — Patient Instructions (Addendum)
check your blood sugar twice a day.  vary the time of day when you check, between before the 3 meals, and at bedtime.  also check if you have symptoms of your blood sugar being too high or too low.  please keep a record of the readings and bring it to your next appointment here (or you can bring the meter itself).  You can write it on any piece of paper.  please call us sooner if your blood sugar goes below 70, or if you have a lot of readings over 200. Please continue the same diabetes medications Please come back for a follow-up appointment in 2 months.

## 2020-02-06 NOTE — Progress Notes (Signed)
Subjective:    Patient ID: [name], male    DOB: 06-Jan-1956, 64 y.o.   MRN: [id number]  HPI Pt returns for f/u of diabetes mellitus:  DM type: Insulin-requiring type 2 Dx'ed: 3785 Complications: stage 3 CRI Therapy: insulin since 2019.   DKA: never Severe hypoglycemia: never Pancreatitis: never Pancreatic imaging: normal on 2017 CT.  SDOH: he declines insulins other than Novolog Other: he takes multiple daily injections; he did not tolerate Jardiance (dehydration) or time-release insulins (rash); metformin dosage is limited by abd pain; Interval history: Pt says he takes Novolog 80 units 2-3 times a day (just before each meal).  no cbg record, but states cbg's vary from 171-325.  It is in general highest fasting.  He says this is due to eating at night  He says cbg averages 240.  Past Medical History:  Diagnosis Date  . Arthritis    lower back  . Asthma   . Atrial flutter ([place])    s/p CTI ablation by Dr Rayann Heman  . Brain aneurysm 2009   questionable. A follow up CTA in 2009 showed no evidence of  . Chronic back pain    DDD/stenosis  . Colon polyps    9 polyps removed 10/13/11  . Complication of anesthesia September 11, 2012   slow to awaken after ablation  . COPD (chronic obstructive pulmonary disease) ([place])   . Diabetes mellitus    takes Metformin and Glimepiride daily  . Emphysema   . GERD (gastroesophageal reflux disease)    takes Omeprazole bid  . History of shingles   . HLD (hyperlipidemia)    takes Pravastatin daily  . HTN (hypertension)    takes Prinizide daily  . Obesity   . OSA (obstructive sleep apnea)    not always using cpap  . Overdose 2009   unintentional Flecanide overdose  . Peripheral neuropathy   . Short-term memory loss   . Tobacco abuse     Past Surgical History:  Procedure Laterality Date  . ANTERIOR CERVICAL DECOMP/DISCECTOMY FUSION  01/04/2012   Procedure: ANTERIOR CERVICAL DECOMPRESSION/DISCECTOMY FUSION 1 LEVEL/HARDWARE REMOVAL;   Surgeon: Ophelia Charter, MD;  Location: [place] [facility];  Service: Neurosurgery;  Laterality: N/A;  explore cervical fusion Cervical six - seven  with removal of codman plate anterior cervical decompression with fusion interbody prothesis plating and bonegraft  . APPENDECTOMY  2-58yrs ago  . ATRIAL FIBRILLATION ABLATION N/A 09/11/2012   PT DID NOT HAVE AN ATRIAL FIBRILLATION ABLATION IN 2014!  ATRIAL FLUTTER ABLATION ONLY  . ATRIAL FLUTTER ABLATION  09/11/2012   CTI ablation by Dr Rayann Heman  . BIOPSY  11/07/2019   Procedure: BIOPSY;  Surgeon: Milus Banister, MD;  Location: [facility];  Service: Endoscopy;;  . CARDIAC CATHETERIZATION  2008   no significant CAD  . CARDIOVERSION  05/05/2011   Procedure: CARDIOVERSION;  Surgeon: Lelon Perla, MD;  Location: [facility] ENDOSCOPY;  Service: Cardiovascular;  Laterality: N/A;  . CARDIOVERSION Bilateral 07/26/2012   Procedure: CARDIOVERSION;  Surgeon: Minus Breeding, MD;  Location[facility];  Service: Cardiovascular;  Laterality: Bilateral;  . CARPAL TUNNEL RELEASE  99/2000   bilateral  . COLONOSCOPY WITH PROPOFOL N/A 12/13/2012   Procedure: COLONOSCOPY WITH PROPOFOL;  Surgeon: Milus Banister, MD;  Location: [facility] ENDOSCOPY;  Service: Endoscopy;  Laterality: N/A;  . ELECTROPHYSIOLOGIC STUDY N/A 04/21/2015   Procedure: Atrial Fibrillation Ablation;  Surgeon: Thompson Grayer, MD;  Location: [place] [facility];  Service: Cardiovascular;  Laterality: N/A;  .  ESOPHAGOGASTRODUODENOSCOPY (EGD) WITH PROPOFOL N/A 11/07/2019   Procedure: ESOPHAGOGASTRODUODENOSCOPY (EGD) WITH PROPOFOL;  Surgeon: Milus Banister, MD;  Location: [facility];  Service: Endoscopy;  Laterality: N/A;  . HERNIA REPAIR    . KNEE SURGERY  6-65yrs ago   left  . LEFT HEART CATH AND CORONARY ANGIOGRAPHY N/A 09/19/2016   Procedure: Left Heart Cath and Coronary Angiography;  Surgeon: Belva Crome, MD;  Location: [place] [facility];  Service: Cardiovascular;  Laterality: N/A;  . Left inguinal hernia  repair     as a child  . NASAL SEPTOPLASTY W/ TURBINOPLASTY Bilateral 02/19/2014   Procedure: NASAL SEPTOPLASTY WITH BILATERAL TURBINATE REDUCTION;  Surgeon: Jodi Marble, MD;  Location: [place];  Service: ENT;  Laterality: Bilateral;  . NECK SURGERY  51yrs ago  . right shoulder surgery  4-27yrs ago   cyst removed  . TEE WITHOUT CARDIOVERSION  05/05/2011   Procedure: TRANSESOPHAGEAL ECHOCARDIOGRAM (TEE);  Surgeon: Lelon Perla, MD;  Location: [facility] ENDOSCOPY;  Service: Cardiovascular;  Laterality: N/A;  . TEE WITHOUT CARDIOVERSION N/A 04/21/2015   Procedure: TRANSESOPHAGEAL ECHOCARDIOGRAM (TEE);  Surgeon: Sueanne Margarita, MD;  Location: [facility] ENDOSCOPY;  Service: Cardiovascular;  Laterality: N/A;  . THROAT SURGERY  4-60yrs ago   "thought " it was cancer but came back not  . TONSILLECTOMY      Social History   Socioeconomic History  . Marital status: Married    Spouse name: Not on file  . Number of children: 2  . Years of education: Not on file  . Highest education level: Not on file  Occupational History  . Occupation: Dealer   Tobacco Use  . Smoking status: Current Every Day Smoker    Packs/day: 1.00    Years: 49.00    Pack years: 49.00    Types: Cigarettes  . Smokeless tobacco: Never Used  . Tobacco comment: 1 pack per day 12/02/19 [facility]   Vaping Use  . Vaping Use: Never used  Substance and Sexual Activity  . Alcohol use: Not Currently    Alcohol/week: 0.0 standard drinks    Comment: 12/05/17 pt stated he has drinked 1/2 drink in 3 years  . Drug use: No  . Sexual activity: Yes  Other Topics Concern  . Not on file  Social History Narrative   Daily caffeine(Mountain Dew)       Lives in [place] with spouse.   Unemployed due to chronic back/ leg pain         Social Determinants of Health   Financial Resource Strain:   . Difficulty of Paying Living Expenses: Not on file  Food Insecurity:   . Worried About Charity fundraiser in the Last Year: Not on file  . Ran  Out of Food in the Last Year: Not on file  Transportation Needs:   . Lack of Transportation (Medical): Not on file  . Lack of Transportation (Non-Medical): Not on file  Physical Activity:   . Days of Exercise per Week: Not on file  . Minutes of Exercise per Session: Not on file  Stress:   . Feeling of Stress : Not on file  Social Connections:   . Frequency of Communication with Friends and Family: Not on file  . Frequency of Social Gatherings with Friends and Family: Not on file  . Attends Religious Services: Not on file  . Active Member of Clubs or Organizations: Not on file  . Attends Archivist Meetings: Not on file  . Marital  Status: Not on file  Intimate Partner Violence:   . Fear of Current or Ex-Partner: Not on file  . Emotionally Abused: Not on file  . Physically Abused: Not on file  . Sexually Abused: Not on file    Current Outpatient Medications on File Prior to Visit  Medication Sig Dispense Refill  . albuterol (PROAIR HFA) 108 (90 Base) MCG/ACT inhaler Inhale 1-2 puffs into the lungs every 6 (six) hours as needed for wheezing or shortness of breath. 18 g 5  . Ascorbic Acid (VITAMIN C PO) Take 1,000 mg by mouth daily.     [name] b complex vitamins tablet Take 1 tablet by mouth daily.     . B-D UF III MINI PEN NEEDLES 31G X 5 MM MISC 1 Device by Other route 3 (three) times daily with meals. Use three times daily to inject insulin. 270 each 3  . bisoprolol (ZEBETA) 10 MG tablet Take 10 mg by mouth daily.     [name] diltiazem (TIAZAC) 120 MG 24 hr capsule Take 1 capsule by mouth daily.     . ferrous sulfate 324 MG TBEC Take 324 mg by mouth daily.     [name] gabapentin (NEURONTIN) 300 MG capsule Take by mouth.    [name] HYDROcodone-acetaminophen (NORCO) 7.5-325 MG tablet Take 1 tablet by mouth 4 (four) times daily. Patient is taking 10 mg    . insulin aspart (NOVOLOG FLEXPEN) 100 UNIT/ML FlexPen Inject 80 Units into the skin 3 (three) times daily with meals. E11.9 216 mL 0  .  levocetirizine (XYZAL) 5 MG tablet Take 5 mg by mouth every evening.     [name] losartan (COZAAR) 50 MG tablet Take 1 tablet (50 mg total) by mouth daily. 90 tablet 3  . metFORMIN (GLUCOPHAGE-XR) 500 MG 24 hr tablet Take 1 tablet (500 mg total) by mouth at bedtime. 90 tablet 3  . metolazone (ZAROXOLYN) 2.5 MG tablet TAKE 1 TABLET ON Tuesday AND Thursday 30 MINUTES BEFORE TAKING TORSEMIDE OR AS DIRECTED 15 tablet 3  . Multiple Vitamin (MULTIVITAMIN WITH MINERALS) TABS Take 1 tablet by mouth daily.     . Multiple Vitamins-Minerals (ZINC PO) Take 50 mg by mouth daily.     . nitroGLYCERIN (NITROSTAT) 0.4 MG SL tablet Place 1 tablet (0.4 mg total) under the tongue every 5 (five) minutes x 3 doses as needed for chest pain. 25 tablet 3  . pantoprazole (PROTONIX) 20 MG tablet Take 20 mg by mouth 2 (two) times daily.     . potassium chloride SA (KLOR-CON M20) 20 MEQ tablet Take 1 tablet (20 mEq total) by mouth daily. TAKE AN EXTRA TABLET ON Tuesday AND Thursday 40 tablet 3  . pravastatin (PRAVACHOL) 80 MG tablet TAKE 1 TABLET BY MOUTH DAILY AT BEDTIME 30 tablet 10  . sildenafil (REVATIO) 20 MG tablet Take 100 mg by mouth daily as needed (erectile dysfunction).     . torsemide (DEMADEX) 20 MG tablet TAKE 2 TABLETS BY MOUTH 2 TIMES DAILY 270 tablet 3  . triamcinolone cream (KENALOG) 0.1 % Apply 1 application topically daily as needed. 453 g 2  . umeclidinium-vilanterol (ANORO ELLIPTA) 62.5-25 MCG/INH AEPB Inhale 1 puff into the lungs daily. 60 each 5  . VITAMIN A PO Take 2,400 mcg by mouth daily.     . Vitamin D, Ergocalciferol, (DRISDOL) 1.25 MG (50000 UT) CAPS capsule Take 50,000 Units by mouth every 7 (seven) days.     [name] warfarin (COUMADIN) 5 MG tablet Take 2.5-5 mg by mouth  See admin instructions. Tale 2.5 mg on Monday Take 5 mg every other day     No current facility-administered medications on file prior to visit.    Allergies  Allergen Reactions  . Adhesive [Tape]     itching  . Latex Itching     When tape is on the skin too long skin gets red & itching    Family History  Problem Relation Age of Onset  . Diabetes Mother   . Heart disease Father   . Lung cancer Father   . Diabetes Maternal Grandmother   . Colon cancer Neg Hx   . Anesthesia problems Neg Hx   . Hypotension Neg Hx   . Malignant hyperthermia Neg Hx   . Pseudochol deficiency Neg Hx   . Esophageal cancer Neg Hx   . Pancreatic cancer Neg Hx   . Stomach cancer Neg Hx     BP 120/64   Pulse 64   Ht 6\' 2"  (1.88 m)   Wt 269 lb (122 kg)   SpO2 99%   BMI 34.54 kg/m     Review of Systems     Objective:   Physical Exam VITAL SIGNS:  See vs page GENERAL: no distress Foot exam is declined today  A1c=8.5%     Assessment & Plan:  Insulin-requiring type 2 DM, with stage 3 CRI: uncontrolled.  he declines to change rx today  Patient Instructions  check your blood sugar twice a day.  vary the time of day when you check, between before the 3 meals, and at bedtime.  also check if you have symptoms of your blood sugar being too high or too low.  please keep a record of the readings and bring it to your next appointment here (or you can bring the meter itself).  You can write it on any piece of paper.  please call us sooner if your blood sugar goes below 70, or if you have a lot of readings over 200. Please continue the same diabetes medications Please come back for a follow-up appointment in 2 months.

## 2020-02-12 ENCOUNTER — Other Ambulatory Visit: Payer: Self-pay

## 2020-02-12 ENCOUNTER — Ambulatory Visit (HOSPITAL_COMMUNITY)
Admission: RE | Admit: 2020-02-12 | Discharge: 2020-02-12 | Disposition: A | Discharge status: Home / Self Care | Payer: 59 | Source: Ambulatory Visit | Attending: Cardiovascular Disease | Admitting: Cardiovascular Disease

## 2020-02-12 DIAGNOSIS — F17200 Nicotine dependence, unspecified, uncomplicated: Secondary | ICD-10-CM | POA: Diagnosis not present

## 2020-02-12 DIAGNOSIS — G629 Polyneuropathy, unspecified: Secondary | ICD-10-CM | POA: Diagnosis present

## 2020-02-12 DIAGNOSIS — M79604 Pain in right leg: Secondary | ICD-10-CM | POA: Diagnosis not present

## 2020-02-12 DIAGNOSIS — M79605 Pain in left leg: Secondary | ICD-10-CM | POA: Diagnosis not present

## 2020-02-13 ENCOUNTER — Encounter: Payer: Self-pay | Admitting: Dermatology

## 2020-02-13 ENCOUNTER — Ambulatory Visit (INDEPENDENT_AMBULATORY_CARE_PROVIDER_SITE_OTHER): Payer: 59 | Admitting: Dermatology

## 2020-02-13 DIAGNOSIS — I872 Venous insufficiency (chronic) (peripheral): Secondary | ICD-10-CM

## 2020-02-13 DIAGNOSIS — L920 Granuloma annulare: Secondary | ICD-10-CM | POA: Diagnosis not present

## 2020-02-13 MED ORDER — HALOBETASOL PROPIONATE 0.05 % EX CREA: TOPICAL_CREAM | Freq: Two times a day (BID) | CUTANEOUS | 1 refills | Status: AC | PRN

## 2020-02-13 NOTE — Progress Notes (Signed)
Dr Denna Haggard to check left achilles scale and discoloration

## 2020-02-13 NOTE — Progress Notes (Signed)
   Follow-Up Visit   Subjective  [name] is a 64 y.o. male who presents for the following: Follow-up (GA X 7YEARS + SMOKER, hives everyday but tac topical cream helps the itch,  IL KENALOG HELPED SPOTS).  Follow up Location:  Duration:  Quality:  Associated Signs/Symptoms: Modifying Factors:  Severity:  Timing: Context:   Objective  Well appearing patient in no apparent distress; mood and affect are within normal limits.  All skin waist up examined.   Assessment & Plan   After discussion of the granuloma annulare and Mr. [name] arms we also discussed the concept of venous pressure and stasis dermatitis.  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  He could use the same cream on his leg is we have given him for his arm. 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 Left lower leg also has active dermatitis.   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  Venous stasis dermatitis of right lower extremity Right Lower Leg - Anterior  Venous stasis dermatitis of left lower extremity Left Lower Leg - Anterior  Discussed ways to may use triamcinolone if dermatitis flares.  Minimize venous pressure and lower extremities.     I, Lavonna Monarch, MD, have reviewed all documentation for this visit.  The documentation on 03/21/20 for the exam, diagnosis, procedures, and orders are all accurate and complete.

## 2020-02-14 NOTE — Progress Notes (Signed)
Normal blood flow in the leg. No sign of significant blockage. Recent pain in the thigh is not related to blood flow issue

## 2020-02-18 ENCOUNTER — Other Ambulatory Visit: Payer: Self-pay | Admitting: Student

## 2020-02-18 NOTE — Telephone Encounter (Signed)
This is Dr. Hochrein's pt. °

## 2020-02-24 ENCOUNTER — Telehealth: Payer: Self-pay | Admitting: Specialist

## 2020-02-24 NOTE — Telephone Encounter (Signed)
Patient called stating at the last appointment he was told by Dr. Louanne Skye that he was on the wrong pain medicine. Patient said he could not remember the name of the pain medication he should be taking. Patient wondered if Dr Louanne Skye forwarded the information concerning the medication to his PCP. The number to contact patient is [phone]

## 2020-02-24 NOTE — Telephone Encounter (Signed)
Please advise, I do not see anything noted in the chart-------Patient called stating at the last appointment he was told by Dr. Louanne Skye that he was on the wrong pain medicine. Patient said he could not remember the name of the pain medication he should be taking. Patient wondered if Dr Louanne Skye forwarded the information concerning the medication to his PCP.

## 2020-03-01 ENCOUNTER — Ambulatory Visit
Admission: RE | Admit: 2020-03-01 | Discharge: 2020-03-01 | Disposition: A | Discharge status: Home / Self Care | Payer: 59 | Source: Ambulatory Visit | Attending: Specialist | Admitting: Specialist

## 2020-03-01 ENCOUNTER — Other Ambulatory Visit: Payer: Self-pay

## 2020-03-01 DIAGNOSIS — M4807 Spinal stenosis, lumbosacral region: Secondary | ICD-10-CM

## 2020-03-05 ENCOUNTER — Other Ambulatory Visit: Payer: Self-pay | Admitting: Specialist

## 2020-03-05 ENCOUNTER — Telehealth: Payer: Self-pay | Admitting: Specialist

## 2020-03-05 DIAGNOSIS — M5116 Intervertebral disc disorders with radiculopathy, lumbar region: Secondary | ICD-10-CM

## 2020-03-05 NOTE — Telephone Encounter (Signed)
Pt called stating he's had his MRI but the soonest appt was on 04/02/20 and he would like to know if Dr.Nitka would be willing to give him his results over the phone; and he also mentioned at his last [place] informed him he was taking the wrong pain medicine but then never switched him or called in any refills. PT would like a CB to discuss this please  [phone]

## 2020-03-05 NOTE — Telephone Encounter (Signed)
Per Dr. Louanne Skye he will call him.

## 2020-03-08 NOTE — Progress Notes (Signed)
Cardiology Office Note   Date:  03/09/2020   ID:  [name], DOB 1955/11/22, MRN [id number]  PCP:  Timoteo Gaul, FNP  Cardiologist:   Minus Breeding, MD   Chief Complaint  Patient presents with  . Back Pain      History of Present Illness: [name] is a 64 y.o. male who presents for who presents for follow up of atrial fibrillation and atrial flutter. He is s/p flutter ablation in 2014 and has actually done well from this standpoint. He had a cath in May 2018 that showed widely patent coronaries and normal LVF with moderate LVH. He does have significant COPD, sleep apnea (intolerant to C-pap), HTN, chronic back pain, and NIDDM.  He has had increased swelling and SOB.    He has not responded to diuresis.   He was drinking excess fluid and did have salt in his diet.  We have treated him with increased diuresis including Zaroxolyn.  He has had some reported GI bleeding with some red blood.  We sent him for a GI evaluation and he had EGD yesterday.     He had a friable mucosa but it was suggested that he could resume warfarin.  He had continued SOB.  MRI did not suggest amyloidosis to explain moderate concentric hypertrophy.  Following the MRI he had significant leg pain without acute findings on vascular studies.  CK was not elevated.    He has multiple somatic complaints.  However, today his biggest complaints are his back.  He is going to get injections on this.  He has his baseline dyspnea.  Is not having any new shortness of breath, PND or orthopnea unless he forgets to take his Zaroxolyn.  He does seem to be sensitive to this and he does well if he takes this.  He is not having any new chest pressure, neck or arm discomfort.  He has had no new palpitations, presyncope or syncope.  Past Medical History:  Diagnosis Date  . Arthritis    lower back  . Asthma   . Atrial flutter ([place])    s/p CTI ablation by Dr Rayann Heman  . Brain aneurysm 2009   questionable. A follow up  CTA in 2009 showed no evidence of  . Chronic back pain    DDD/stenosis  . Colon polyps    9 polyps removed 10/13/11  . Complication of anesthesia September 11, 2012   slow to awaken after ablation  . COPD (chronic obstructive pulmonary disease) ([place])   . Diabetes mellitus    takes Metformin and Glimepiride daily  . Emphysema   . GERD (gastroesophageal reflux disease)    takes Omeprazole bid  . History of shingles   . HLD (hyperlipidemia)    takes Pravastatin daily  . HTN (hypertension)    takes Prinizide daily  . Obesity   . OSA (obstructive sleep apnea)    not always using cpap  . Overdose 2009   unintentional Flecanide overdose  . Peripheral neuropathy   . Short-term memory loss   . Tobacco abuse     Past Surgical History:  Procedure Laterality Date  . ANTERIOR CERVICAL DECOMP/DISCECTOMY FUSION  01/04/2012   Procedure: ANTERIOR CERVICAL DECOMPRESSION/DISCECTOMY FUSION 1 LEVEL/HARDWARE REMOVAL;  Surgeon: Ophelia Charter, MD;  Location: [facility];  Service: Neurosurgery;  Laterality: N/A;  explore cervical fusion Cervical six - seven  with removal of codman plate anterior cervical decompression with fusion interbody prothesis plating and bonegraft  .  APPENDECTOMY  2-63yrs ago  . ATRIAL FIBRILLATION ABLATION N/A 09/11/2012   PT DID NOT HAVE AN ATRIAL FIBRILLATION ABLATION IN 2014!  ATRIAL FLUTTER ABLATION ONLY  . ATRIAL FLUTTER ABLATION  09/11/2012   CTI ablation by Dr Rayann Heman  . BIOPSY  11/07/2019   Procedure: BIOPSY;  Surgeon: Milus Banister, MD;  Location: [facility];  Service: Endoscopy;;  . CARDIAC CATHETERIZATION  2008   no significant CAD  . CARDIOVERSION  05/05/2011   Procedure: CARDIOVERSION;  Surgeon: Lelon Perla, MD;  Location: [facility] ENDOSCOPY;  Service: Cardiovascular;  Laterality: N/A;  . CARDIOVERSION Bilateral 07/26/2012   Procedure: CARDIOVERSION;  Surgeon: Minus Breeding, MD;  Location: [facility] ENDOSCOPY;  Service: Cardiovascular;  Laterality: Bilateral;  .  CARPAL TUNNEL RELEASE  99/2000   bilateral  . COLONOSCOPY WITH PROPOFOL N/A 12/13/2012   Procedure: COLONOSCOPY WITH PROPOFOL;  Surgeon: Milus Banister, MD;  Location: [facility];  Service: Endoscopy;  Laterality: N/A;  . ELECTROPHYSIOLOGIC STUDY N/A 04/21/2015   Procedure: Atrial Fibrillation Ablation;  Surgeon: Thompson Grayer, MD;  Location: [facility];  Service: Cardiovascular;  Laterality: N/A;  . ESOPHAGOGASTRODUODENOSCOPY (EGD) WITH PROPOFOL N/A 11/07/2019   Procedure: ESOPHAGOGASTRODUODENOSCOPY (EGD) WITH PROPOFOL;  Surgeon: Milus Banister, MD;  Location: [facility];  Service: Endoscopy;  Laterality: N/A;  . HERNIA REPAIR    . KNEE SURGERY  6-47yrs ago   left  . LEFT HEART CATH AND CORONARY ANGIOGRAPHY N/A 09/19/2016   Procedure: Left Heart Cath and Coronary Angiography;  Surgeon: Belva Crome, MD;  Location: [facility];  Service: Cardiovascular;  Laterality: N/A;  . Left inguinal hernia repair     as a child  . NASAL SEPTOPLASTY W/ TURBINOPLASTY Bilateral 02/19/2014   Procedure: NASAL SEPTOPLASTY WITH BILATERAL TURBINATE REDUCTION;  Surgeon: Jodi Marble, MD;  Location: [facility];  Service: ENT;  Laterality: Bilateral;  . NECK SURGERY  67yrs ago  . right shoulder surgery  4-34yrs ago   cyst removed  . TEE WITHOUT CARDIOVERSION  05/05/2011   Procedure: TRANSESOPHAGEAL ECHOCARDIOGRAM (TEE);  Surgeon: Lelon Perla, MD;  Location: [facility] ENDOSCOPY;  Service: Cardiovascular;  Laterality: N/A;  . TEE WITHOUT CARDIOVERSION N/A 04/21/2015   Procedure: TRANSESOPHAGEAL ECHOCARDIOGRAM (TEE);  Surgeon: Sueanne Margarita, MD;  Location: [facility] ENDOSCOPY;  Service: Cardiovascular;  Laterality: N/A;  . THROAT SURGERY  4-33yrs ago   "thought " it was cancer but came back not  . TONSILLECTOMY       Current Outpatient Medications  Medication Sig Dispense Refill  . albuterol (PROAIR HFA) 108 (90 Base) MCG/ACT inhaler Inhale 1-2 puffs into the lungs every 6 (six) hours as needed for wheezing  or shortness of breath. 18 g 5  . Ascorbic Acid (VITAMIN C PO) Take 1,000 mg by mouth daily.     [name] b complex vitamins tablet Take 1 tablet by mouth daily.     . B-D UF III MINI PEN NEEDLES 31G X 5 MM MISC 1 Device by Other route 3 (three) times daily with meals. Use three times daily to inject insulin. 270 each 3  . bisoprolol (ZEBETA) 10 MG tablet Take 10 mg by mouth daily.     [name] diltiazem (TIAZAC) 120 MG 24 hr capsule Take 1 capsule by mouth daily.     . ferrous sulfate 324 MG TBEC Take 324 mg by mouth daily.     [name] gabapentin (NEURONTIN) 300 MG capsule Take by mouth.    . gabapentin (NEURONTIN) 300 MG capsule  Take 1 capsule (300 mg total) by mouth 2 (two) times daily. History of CHF and cirrhosis and renal insufficiency requires less dose. 60 capsule 3  . HYDROcodone-acetaminophen (NORCO) 7.5-325 MG tablet Take 1 tablet by mouth 4 (four) times daily. Patient is taking 10 mg    . insulin aspart (NOVOLOG FLEXPEN) 100 UNIT/ML FlexPen Inject 80 Units into the skin 3 (three) times daily with meals. E11.9 216 mL 0  . levocetirizine (XYZAL) 5 MG tablet Take 5 mg by mouth every evening.     [name] losartan (COZAAR) 50 MG tablet Take 1 tablet (50 mg total) by mouth daily. 90 tablet 3  . metFORMIN (GLUCOPHAGE-XR) 500 MG 24 hr tablet Take 1 tablet (500 mg total) by mouth at bedtime. 90 tablet 3  . metolazone (ZAROXOLYN) 2.5 MG tablet TAKE 1 TABLET ON Tuesday AND Thursday 30 MINUTES BEFORE TAKING TORSEMIDE OR AS DIRECTED 15 tablet 3  . Multiple Vitamin (MULTIVITAMIN WITH MINERALS) TABS Take 1 tablet by mouth daily.     . Multiple Vitamins-Minerals (ZINC PO) Take 50 mg by mouth daily.     . nitroGLYCERIN (NITROSTAT) 0.4 MG SL tablet Place 1 tablet (0.4 mg total) under the tongue every 5 (five) minutes x 3 doses as needed for chest pain. 25 tablet 3  . pantoprazole (PROTONIX) 20 MG tablet Take 20 mg by mouth 2 (two) times daily.     . potassium chloride SA (KLOR-CON) 20 MEQ tablet TAKE 1 TABLET BY MOUTH DAILY.  TAKE AN EXTRA TABLET ON TUESDAY AND THURSDAY 40 tablet 11  . pravastatin (PRAVACHOL) 80 MG tablet TAKE 1 TABLET BY MOUTH DAILY AT BEDTIME 30 tablet 10  . torsemide (DEMADEX) 20 MG tablet TAKE 2 TABLETS BY MOUTH 2 TIMES DAILY 270 tablet 3  . triamcinolone cream (KENALOG) 0.1 % Apply 1 application topically daily as needed. 453 g 2  . umeclidinium-vilanterol (ANORO ELLIPTA) 62.5-25 MCG/INH AEPB Inhale 1 puff into the lungs daily. 60 each 5  . VITAMIN A PO Take 2,400 mcg by mouth daily.     . Vitamin D, Ergocalciferol, (DRISDOL) 1.25 MG (50000 UT) CAPS capsule Take 50,000 Units by mouth every 7 (seven) days.     [name] warfarin (COUMADIN) 5 MG tablet Take 2.5-5 mg by mouth See admin instructions. Tale 2.5 mg on Monday Take 5 mg every other day    . halobetasol (ULTRAVATE) 0.05 % cream Apply topically 2 (two) times daily as needed. 50 g 1  . sildenafil (REVATIO) 20 MG tablet Take 100 mg by mouth daily as needed (erectile dysfunction).  (Patient not taking: Reported on 03/09/2020)     No current facility-administered medications for this visit.    Allergies:   Adhesive [tape] and Latex    ROS:  Please see the history of present illness.   Otherwise, review of systems are positive for neuropathy.   All other systems are reviewed and negative.    PHYSICAL EXAM: VS:  BP (!) 122/58 (BP Location: Left Arm, Patient Position: Sitting)   Pulse 67   SpO2 95%  , BMI There is no height or weight on file to calculate BMI. GENERAL:  Well appearing NECK:  No jugular venous distention, waveform within normal limits, carotid upstroke brisk and symmetric, no bruits, no thyromegaly LUNGS:  Clear to auscultation bilaterally CHEST:  Unremarkable HEART:  PMI not displaced or sustained,S1 and S2 within normal limits, no S3, no S4, no clicks, no rubs, 3 out of 6 apical systolic murmur radiating up the aortic  outflow tract, no diastolic murmurs ABD:  Flat, positive bowel sounds normal in frequency in pitch, no bruits,  no rebound, no guarding, no midline pulsatile mass, no hepatomegaly, no splenomegaly, obese EXT:  2 plus pulses throughout, mild leg edema, no cyanosis no clubbing   EKG:  EKG is not ordered today.   Recent Labs: 07/29/2019: TSH 4.330 09/16/2019: BNP 61.2 12/16/2019: ALT 25; BUN 22; Creatinine, Ser 1.23; Hemoglobin 10.9; Platelets 111; Potassium 3.7; Sodium 144    Lipid Panel    Component Value Date/Time   CHOL 122 08/18/2017 0910   TRIG 400 (H) 08/18/2017 0910   HDL 29 (L) 08/18/2017 0910   CHOLHDL 4.2 08/18/2017 0910   CHOLHDL 5.3 08/27/2016 0235   VLDL 66 (H) 08/27/2016 0235   LDLCALC 13 08/18/2017 0910   LDLDIRECT 99.9 09/08/2011 0843      Wt Readings from Last 3 Encounters:  02/06/20 269 lb 9.6 oz (122.3 kg)  02/06/20 269 lb (122 kg)  01/24/20 270 lb 3.2 oz (122.6 kg)      Other studies Reviewed: Additional studies/ records that were reviewed today include:  Vascular studies and labs Review of the above records demonstrates:  Please see elsewhere in the note.     ASSESSMENT AND PLAN:   Edema/SOB I have done an extensive work up without identification of an acute reversible cause for his complaints.  We will continue with the meds as listed.  No further work-up.  He had normal coronaries in 2018.  I suggested continued follow-up with pulmonary if he has continued symptoms.  He will remain on the current dose of medications.   COPD (chronic obstructive pulmonary disease) ([place]) I had previously suggested follow up with pulmonary.  He cancelled this appt previously.  I have again suggested follow-up.  H  Essential hypertension The blood pressure is at target.  No change in therapy.   Sleep apnea He does not sleep with his CPAP anymore.    Non-insulin dependent type 2 diabetes mellitus (HCC) A1c was 8.6 which is up from 8.1.  He follows with Dr. Loanne Drilling.  PAF (paroxysmal atrial fibrillation) (HCC) He tolerates anticoagulation.  Has had no symptomatic paroxysms.   He can hold his warfarin for 5 days prior to his back injections.  No bridging anticoagulation is necessary.   LVH:  He has moderate LVH with biatrial enlargement.  PYP scan was indeterminate.  However, MRI was negative for evidence of amyloid.  Continue with salt/fluid management and BP control.  I reviewed all of this today.  No further work-up is suggested.   LEG PAIN:  There is no vascular etiology and no evidence of myositis.  He is being evaluated by neurosurgery.  I reviewed neurosurgery notes for this visit.  As above he can hold his warfarin for back injection.  Covid Education He has had both vaccines.   Current medicines are reviewed at length with the patient today.  The patient does not have concerns regarding medicines.  The following changes have been made:    None  Labs/ tests ordered today include: None  No orders of the defined types were placed in this encounter.    Disposition:   FU with [name] in one year.   Signed, Minus Breeding, MD  03/09/2020 12:02 PM     [facility]

## 2020-03-09 ENCOUNTER — Telehealth: Payer: Self-pay | Admitting: Specialist

## 2020-03-09 ENCOUNTER — Ambulatory Visit (INDEPENDENT_AMBULATORY_CARE_PROVIDER_SITE_OTHER): Payer: 59 | Admitting: Pharmacist

## 2020-03-09 ENCOUNTER — Other Ambulatory Visit: Payer: Self-pay

## 2020-03-09 ENCOUNTER — Ambulatory Visit (INDEPENDENT_AMBULATORY_CARE_PROVIDER_SITE_OTHER): Payer: 59 | Admitting: Cardiology

## 2020-03-09 ENCOUNTER — Encounter: Payer: Self-pay | Admitting: Cardiology

## 2020-03-09 VITALS — BP 122/58 | HR 67

## 2020-03-09 DIAGNOSIS — I517 Cardiomegaly: Secondary | ICD-10-CM

## 2020-03-09 DIAGNOSIS — R0602 Shortness of breath: Secondary | ICD-10-CM

## 2020-03-09 DIAGNOSIS — Z7901 Long term (current) use of anticoagulants: Secondary | ICD-10-CM

## 2020-03-09 DIAGNOSIS — I480 Paroxysmal atrial fibrillation: Secondary | ICD-10-CM | POA: Diagnosis not present

## 2020-03-09 DIAGNOSIS — I10 Essential (primary) hypertension: Secondary | ICD-10-CM | POA: Diagnosis not present

## 2020-03-09 DIAGNOSIS — M7989 Other specified soft tissue disorders: Secondary | ICD-10-CM

## 2020-03-09 LAB — POCT INR: INR: 2.9 (ref 2.0–3.0)

## 2020-03-09 NOTE — Patient Instructions (Signed)
Medication Instructions:  No changes *If you need a refill on your cardiac medications before your next appointment, please call your pharmacy*   Lab Work: No changes If you have labs (blood work) drawn today and your tests are completely normal, you will receive your results only by: [name] MyChart Message (if you have MyChart) OR . A paper copy in the mail If you have any lab test that is abnormal or we need to change your treatment, we will call you to review the results.   Testing/Procedures: None ordered   Follow-Up: At [facility], you and your health needs are our priority.  As part of our continuing mission to provide you with exceptional heart care, we have created designated Provider Care Teams.  These Care Teams include your primary Cardiologist (physician) and Advanced Practice Providers (APPs -  Physician Assistants and Nurse Practitioners) who all work together to provide you with the care you need, when you need it.  We recommend signing up for the patient portal called "MyChart".  Sign up information is provided on this After Visit Summary.  MyChart is used to connect with patients for Virtual Visits (Telemedicine).  Patients are able to view lab/test results, encounter notes, upcoming appointments, etc.  Non-urgent messages can be sent to your provider as well.   To learn more about what you can do with MyChart, go to [email or web address].    Your next appointment:   12 month(s)  The format for your next appointment:   In Person  Provider:   [name], PA-C   Other Instructions None

## 2020-03-09 NOTE — Telephone Encounter (Signed)
Pt called stating he was cleared to get the injections and would like to know what he needs to do next to proceed   [phone]

## 2020-03-10 NOTE — Telephone Encounter (Signed)
I have already placed order with Dr. Ernestina Patches.

## 2020-03-20 ENCOUNTER — Other Ambulatory Visit: Payer: Self-pay | Admitting: Endocrinology

## 2020-03-20 DIAGNOSIS — Z794 Long term (current) use of insulin: Secondary | ICD-10-CM

## 2020-03-21 ENCOUNTER — Encounter: Payer: Self-pay | Admitting: Dermatology

## 2020-03-24 ENCOUNTER — Ambulatory Visit (INDEPENDENT_AMBULATORY_CARE_PROVIDER_SITE_OTHER): Payer: 59 | Admitting: Physical Medicine and Rehabilitation

## 2020-03-24 ENCOUNTER — Other Ambulatory Visit: Payer: Self-pay

## 2020-03-24 ENCOUNTER — Encounter: Payer: Self-pay | Admitting: Physical Medicine and Rehabilitation

## 2020-03-24 VITALS — BP 125/68 | HR 69

## 2020-03-24 DIAGNOSIS — M48061 Spinal stenosis, lumbar region without neurogenic claudication: Secondary | ICD-10-CM | POA: Diagnosis not present

## 2020-03-24 DIAGNOSIS — M5116 Intervertebral disc disorders with radiculopathy, lumbar region: Secondary | ICD-10-CM

## 2020-03-24 DIAGNOSIS — M48062 Spinal stenosis, lumbar region with neurogenic claudication: Secondary | ICD-10-CM

## 2020-03-24 NOTE — Progress Notes (Signed)
Low back pain. Posterior left leg pain. Right anterior thigh pain and calf pain.  Numeric Pain Rating Scale and Functional Assessment Average Pain 8 Pain Right Now 8 My pain is constant, sharp, dull and aching Pain is worse with: walking, sitting, standing and some activites Pain improves with: rest   In the last MONTH (on 0-10 scale) has pain interfered with the following?  1. General activity like being  able to carry out your everyday physical activities such as walking, climbing stairs, carrying groceries, or moving a chair?  Rating(10)  2. Relation with others like being able to carry out your usual social activities and roles such as  activities at home, at work and in your community. Rating(10)  3. Enjoyment of life such that you have  been bothered by emotional problems such as feeling anxious, depressed or irritable?  Rating(8)

## 2020-03-25 ENCOUNTER — Telehealth: Payer: Self-pay | Admitting: Pulmonary Disease

## 2020-03-25 ENCOUNTER — Encounter: Payer: Self-pay | Admitting: Physical Medicine and Rehabilitation

## 2020-03-25 MED ORDER — DOXYCYCLINE HYCLATE 100 MG PO TABS: 100.0000 mg | ORAL_TABLET | Freq: Two times a day (BID) | ORAL | 0 refills | Status: DC

## 2020-03-25 MED ORDER — PREDNISONE 10 MG PO TABS: ORAL_TABLET | ORAL | 0 refills | Status: AC

## 2020-03-25 NOTE — Telephone Encounter (Signed)
Called spoke with patient. Let him know Dr. Bari Mantis recommendations.  Patient states when he took prednisone before it raised his blood sugars really high. I told him this was a low dose, try it and if they start to increase let us know. He voiced understanding   RX sent to preferred pharmacy  Nothing further needed at this time.

## 2020-03-25 NOTE — Progress Notes (Signed)
[name] - 64 y.o. male MRN [id number]  Date of birth: 1956-05-16  Office Visit Note: Visit Date: 03/24/2020 PCP: Timoteo Gaul, FNP Referred by: Timoteo Gaul, FNP  Subjective: Chief Complaint  Patient presents with  . Lower Back - Pain   HPI: [name] is a 64 y.o. male who comes in today For evaluation and management at the request of Dr. Basil Dess for chronic worsening severe back pain with 8 out of 10 pain referring into the posterior lateral left leg but predominantly the right anterior lateral thigh sometimes down into the calf.  He does get paresthesias on the right.  He reports this is a constant sharp pain sometimes dull and aching pain.  It is worse with standing and walking better at rest.  He does take oxycodone at this point which has been switched from hydrocodone.  This is managed by his primary care provider.  He reports that that does not really help his leg pain at all.  His history is complicated by multiple medical problems including cardiovascular disease and diabetes with a history of polyneuropathy.  Last hemoglobin A1c was in the mid 8 range.  He does not endorse any focal weakness or foot drop.  He does ambulate with a cane.  Dr. Louanne Skye did obtain MRI of the lumbar spine this is reviewed with the patient today with spine models and imaging.  Report is below.  He had a prior MRI from 2018 showing similar findings.  In 2018 he was under the care of Dr. Newman Pies at [facility].  He ended up having injection one time by Dr. Clydell Hakim which was helpful for maybe a few days.  The patient has a lot of misunderstanding about injections and cortisone and steroids and we spent probably 30 minutes alone just talking about injections in their usefulness and how cortisone works.  He has not had any lumbar surgery but has had prior ACDF.  Symptoms have been going on now for obviously several years given the fact that has been  treated in the past by neurosurgeon in 2018.  He has progressive worsening and really it does limit his daily activities and he rates this is a 10 out of 10 in terms of what limits.  He has had physical therapy in the past and continues to try to stretch.  Review of Systems  Musculoskeletal: Positive for back pain and joint pain.  Neurological: Positive for tingling.  All other systems reviewed and are negative.  Otherwise per HPI.  Assessment & Plan: Visit Diagnoses:  1. Spinal stenosis of lumbar region with neurogenic claudication   2. Radiculopathy due to lumbar intervertebral disc disorder   3. Bilateral stenosis of lateral recess of lumbar spine     Plan: Findings:  Chronic worsening severe back pain with more referral in the right anterior lateral hip and more of an L4 distribution.  He has some findings consistent maybe with a little bit more lower nerve root on the left but that is he is more minor issue as he describes mainly the right-sided symptoms.  He has tried and failed all other manner of conservative care.  A lot of misconceptions about injections in the past and these have been thoroughly described now and educated.  He is a diabetic which will be an issue with steroid medications he reports prior injection did not seem to increase his blood sugars although prednisone orally did.  He is  also on anticoagulation.  He tells me that Dr. Louanne Skye did seek approval to have him off the anticoagulation and is heart doctor did say he could do that.  At this point looking at his symptoms and his MRI I do feel like an L4 transforaminal injection might be the best approach on the right and that could be done without taken him off his anticoagulation which would be safer.  Dr. Louanne Skye had suggested at 1 point an interlaminar injection at L5-S1 but really there is pars defect here without listhesis is just a very small central protrusion I think more of his symptoms are coming from that L3-4 region.   If he does not get success with an L4 transforaminal injection we could look at L3.    Meds & Orders: No orders of the defined types were placed in this encounter.  No orders of the defined types were placed in this encounter.   Follow-up: Return for Right L4 transforaminal epidural steroid injection.   Procedures: No procedures performed  No notes on file   Clinical History: MRI LUMBAR SPINE WITHOUT CONTRAST  TECHNIQUE: Multiplanar, multisequence MR imaging of the lumbar spine was performed. No intravenous contrast was administered.  COMPARISON:  02/06/2020 lumbar spine radiographs. 05/06/2017 MRI lumbar spine and prior.  FINDINGS: Segmentation:  Standard.  Alignment:  Straightening of lordosis.  Grade 1 L3-4 retrolisthesis.  Vertebrae: Multilevel Modic type 2 endplate degenerative changes. Multilevel Schmorl's node formation. Scattered hemangiomata versus focal fat.  Conus medullaris and cauda equina: Conus extends to the L1 level. Conus and cauda equina appear normal.  Disc levels: Multilevel desiccation disc space loss most prominent at the L3-4 and L5-S1 levels.  L1-2: Disc bulge with small central protrusion, ligamentum flavum and bilateral facet hypertrophy. Mild spinal canal and bilateral neural foraminal narrowing.  L2-3: Disc bulge, ligamentum flavum and bilateral facet hypertrophy. Mild spinal canal, moderate right and mild left neural foraminal narrowing.  L3-4: Disc bulge abutting the ventral thecal sac with prominent subarticular components effacing the lateral recess. There is abutment of the exiting L3 and descending L4 nerve roots. Bilateral facet hypertrophy. Moderate spinal canal and bilateral neural foraminal narrowing, unchanged.  L4-5: Disc bulge and bilateral facet hypertrophy. Small left foraminal protrusion grazing the exiting L4 nerve root. Patent spinal canal. Moderate bilateral neural foraminal narrowing.  L5-S1: Bilateral  pars defects. Disc bulge with superimposed central protrusion/annular fissuring. Bilateral facet hypertrophy. Patent spinal canal. Mild bilateral neural foraminal narrowing.  Paraspinal and other soft tissues: Bilateral renal cysts.  IMPRESSION: Multilevel spondylosis, grossly unchanged.  Moderate L3-4 and mild L1-3 spinal canal narrowing.  Moderate right L2-3 and bilateral L3-5 neural foraminal narrowing.  Mild bilateral L1-2, L5-S1 neural foraminal narrowing.   Electronically Signed   By: Primitivo Gauze M.D.   On: 03/01/2020 10:46   He reports that he has been smoking cigarettes. He has a 49.00 pack-year smoking history. He has never used smokeless tobacco.  Recent Labs    08/22/19 0737 10/08/19 1111 11/21/19 0846  HGBA1C 8.4* 8.1* 8.4*    Objective:  VS:  HT:    WT:   BMI:     BP:125/68  HR:69bpm  TEMP: ( )  RESP:  Physical Exam Constitutional:      General: He is not in acute distress.    Appearance: Normal appearance. He is obese. He is not ill-appearing.  HENT:     Head: Normocephalic and atraumatic.     Right Ear: External ear normal.  Left Ear: External ear normal.  Eyes:     Extraocular Movements: Extraocular movements intact.  Cardiovascular:     Rate and Rhythm: Normal rate.     Pulses: Normal pulses.  Abdominal:     General: There is no distension.     Palpations: Abdomen is soft.  Musculoskeletal:        General: No tenderness or signs of injury.     Right lower leg: No edema.     Left lower leg: No edema.     Comments: Patient has good distal strength without clonus.  He ambulates with a cane.  He has somewhat impaired sensation in the stocking distribution in the feet.  He has pain going from sit to stand and concordant low back pain which is probably facet mediated with facet loading and extension.  No pain with hip rotation.  Skin:    Findings: No erythema or rash.  Neurological:     General: No focal deficit present.      Mental Status: He is alert and oriented to person, place, and time.     Sensory: No sensory deficit.     Motor: No weakness or abnormal muscle tone.     Coordination: Coordination normal.  Psychiatric:        Mood and Affect: Mood normal.        Behavior: Behavior normal.     Ortho Exam  Imaging: No results found.  Past Medical/Family/Surgical/Social History: Medications & Allergies reviewed per EMR, new medications updated. Patient Active Problem List   Diagnosis Date Noted  . LVH (left ventricular hypertrophy) 11/07/2019  . Heme positive stool 10/03/2019  . Anemia 10/03/2019  . Insulin dependent type 2 diabetes mellitus ([place]) 08/26/2019  . Acute on chronic diastolic heart failure ([place]) 08/26/2019  . Rash and nonspecific skin eruption 08/26/2019  . Foot pain 08/22/2019  . Right heart failure ([place]) 08/07/2019  . Leg swelling 07/28/2019  . Educated about COVID-19 virus infection 07/28/2019  . Diabetes ([place]) 06/12/2019  . Urticaria 05/15/2019  . Dermatitis due to drug 04/18/2019  . Edema 03/29/2019  . Erectile dysfunction 03/29/2019  . High risk medication use 03/29/2019  . History of iron deficiency 03/29/2019  . Pulmonary hypertension ([place]) 03/29/2019  . Venous stasis dermatitis of both lower extremities 03/29/2019  . Mixed hyperlipidemia 02/14/2019  . Onychomycosis due to dermatophyte 11/06/2018  . Hammer toe of right foot 07/17/2018  . Tinea pedis of both feet 07/17/2018  . History of radiofrequency ablation (RFA) procedure for cardiac arrhythmia 06/21/2018  . Lumbar back pain with radiculopathy affecting lower extremity 06/21/2018  . Hepatic cirrhosis ([place]) 12/26/2017  . Left lower quadrant pain 12/05/2017  . Change in bowel habits 12/05/2017  . Palpitations 10/03/2017  . SOB (shortness of breath) 04/12/2017  . Chest pain with moderate risk of acute coronary syndrome 08/28/2016  . Unstable angina ([place]) 08/26/2016  . Longstanding persistent atrial fibrillation  ([place]) 04/21/2015  . Deviated nasal septum 02/19/2014  . History of colon polyps 11/22/2012  . Syncope 10/17/2012  . Acute asthmatic bronchitis 11/22/2011  . Benign neoplasm of colon 10/13/2011  . Unspecified gastritis and gastroduodenitis without mention of hemorrhage 10/13/2011  . GERD (gastroesophageal reflux disease) 10/13/2011  . Obesity (BMI 30-39.9) 09/30/2011  . Dyslipidemia 06/23/2011  . Neuropathy 06/23/2011  . Fatigue 05/13/2011  . Malaise and fatigue 05/13/2011  . Rectal bleeding 04/18/2011  . Atrial flutter ([place]) 04/18/2011  . Chronic anticoagulation 11/10/2010  . PAF (paroxysmal atrial fibrillation) ([place])   .  Tobacco abuse   . COPD (chronic obstructive pulmonary disease) ([place]) 02/11/2010  . Essential hypertension 01/28/2010  . OSA (obstructive sleep apnea) 01/28/2010   Past Medical History:  Diagnosis Date  . Arthritis    lower back  . Asthma   . Atrial flutter ([place])    s/p CTI ablation by Dr Rayann Heman  . Brain aneurysm 2009   questionable. A follow up CTA in 2009 showed no evidence of  . Chronic back pain    DDD/stenosis  . Colon polyps    9 polyps removed 10/13/11  . Complication of anesthesia September 11, 2012   slow to awaken after ablation  . COPD (chronic obstructive pulmonary disease) ([place])   . Diabetes mellitus    takes Metformin and Glimepiride daily  . Emphysema   . GERD (gastroesophageal reflux disease)    takes Omeprazole bid  . History of shingles   . HLD (hyperlipidemia)    takes Pravastatin daily  . HTN (hypertension)    takes Prinizide daily  . Obesity   . OSA (obstructive sleep apnea)    not always using cpap  . Overdose 2009   unintentional Flecanide overdose  . Peripheral neuropathy   . Short-term memory loss   . Tobacco abuse    Family History  Problem Relation Age of Onset  . Diabetes Mother   . Heart disease Father   . Lung cancer Father   . Diabetes Maternal Grandmother   . Colon cancer Neg Hx   . Anesthesia problems Neg Hx    . Hypotension Neg Hx   . Malignant hyperthermia Neg Hx   . Pseudochol deficiency Neg Hx   . Esophageal cancer Neg Hx   . Pancreatic cancer Neg Hx   . Stomach cancer Neg Hx    Past Surgical History:  Procedure Laterality Date  . ANTERIOR CERVICAL DECOMP/DISCECTOMY FUSION  01/04/2012   Procedure: ANTERIOR CERVICAL DECOMPRESSION/DISCECTOMY FUSION 1 LEVEL/HARDWARE REMOVAL;  Surgeon: Ophelia Charter, MD;  Location: [place] [facility];  Service: Neurosurgery;  Laterality: N/A;  explore cervical fusion Cervical six - seven  with removal of codman plate anterior cervical decompression with fusion interbody prothesis plating and bonegraft  . APPENDECTOMY  2-22yrs ago  . ATRIAL FIBRILLATION ABLATION N/A 09/11/2012   PT DID NOT HAVE AN ATRIAL FIBRILLATION ABLATION IN 2014!  ATRIAL FLUTTER ABLATION ONLY  . ATRIAL FLUTTER ABLATION  09/11/2012   CTI ablation by Dr Rayann Heman  . BIOPSY  11/07/2019   Procedure: BIOPSY;  Surgeon: Milus Banister, MD;  Location: [facility];  Service: Endoscopy;;  . CARDIAC CATHETERIZATION  2008   no significant CAD  . CARDIOVERSION  05/05/2011   Procedure: CARDIOVERSION;  Surgeon: Lelon Perla, MD;  Location: [facility] ENDOSCOPY;  Service: Cardiovascular;  Laterality: N/A;  . CARDIOVERSION Bilateral 07/26/2012   Procedure: CARDIOVERSION;  Surgeon: Minus Breeding, MD;  Location[facility] ENDOSCOPY;  Service: Cardiovascular;  Laterality: Bilateral;  . CARPAL TUNNEL RELEASE  99/2000   bilateral  . COLONOSCOPY WITH PROPOFOL N/A 12/13/2012   Procedure: COLONOSCOPY WITH PROPOFOL;  Surgeon: Milus Banister, MD;  Location: [facility] ENDOSCOPY;  Service: Endoscopy;  Laterality: N/A;  . ELECTROPHYSIOLOGIC STUDY N/A 04/21/2015   Procedure: Atrial Fibrillation Ablation;  Surgeon: Thompson Grayer, MD;  Location: [place] [facility] CV LAB;  Service: Cardiovascular;  Laterality: N/A;  . ESOPHAGOGASTRODUODENOSCOPY (EGD) WITH PROPOFOL N/A 11/07/2019   Procedure: ESOPHAGOGASTRODUODENOSCOPY (EGD) WITH PROPOFOL;  Surgeon:  Milus Banister, MD;  Location: [facility] ENDOSCOPY;  Service: Endoscopy;  Laterality: N/A;  .  HERNIA REPAIR    . KNEE SURGERY  6-31yrs ago   left  . LEFT HEART CATH AND CORONARY ANGIOGRAPHY N/A 09/19/2016   Procedure: Left Heart Cath and Coronary Angiography;  Surgeon: Belva Crome, MD;  Location: [place] CV LAB;  Service: Cardiovascular;  Laterality: N/A;  . Left inguinal hernia repair     as a child  . NASAL SEPTOPLASTY W/ TURBINOPLASTY Bilateral 02/19/2014   Procedure: NASAL SEPTOPLASTY WITH BILATERAL TURBINATE REDUCTION;  Surgeon: Jodi Marble, MD;  Location: [facility];  Service: ENT;  Laterality: Bilateral;  . NECK SURGERY  13yrs ago  . right shoulder surgery  4-76yrs ago   cyst removed  . TEE WITHOUT CARDIOVERSION  05/05/2011   Procedure: TRANSESOPHAGEAL ECHOCARDIOGRAM (TEE);  Surgeon: Lelon Perla, MD;  Location: [facility] ENDOSCOPY;  Service: Cardiovascular;  Laterality: N/A;  . TEE WITHOUT CARDIOVERSION N/A 04/21/2015   Procedure: TRANSESOPHAGEAL ECHOCARDIOGRAM (TEE);  Surgeon: Sueanne Margarita, MD;  Location: [facility] ENDOSCOPY;  Service: Cardiovascular;  Laterality: N/A;  . THROAT SURGERY  4-66yrs ago   "thought " it was cancer but came back not  . TONSILLECTOMY     Social History   Occupational History  . Occupation: Dealer   Tobacco Use  . Smoking status: Current Every Day Smoker    Packs/day: 1.00    Years: 49.00    Pack years: 49.00    Types: Cigarettes  . Smokeless tobacco: Never Used  . Tobacco comment: 1 pack per day 12/02/19 [facility]   Vaping Use  . Vaping Use: Never used  Substance and Sexual Activity  . Alcohol use: Not Currently    Alcohol/week: 0.0 standard drinks    Comment: 12/05/17 pt stated he has drinked 1/2 drink in 3 years  . Drug use: No  . Sexual activity: Yes

## 2020-03-25 NOTE — Telephone Encounter (Signed)
Called and spoke with pt who states he has had symptoms x2 weeks including cough with green phlegm as well as having yellow-green postnasal drainage. Pt said that he was hoping that his symptoms would just go away but they have just gradually gotten worse.  Pt said due to congestion, he did have some blood tinged nasal drainage today 11/10.  Pt has had some wheezing, some increased SOB but said the SOB does improve after coughing up phlegm.  Pt denies any complaints of headache but does have some pressure in head/sinuses.  Pt has not tried any OTC meds due to not sure what would be okay for him to take with his health conditions as well as all the other meds he currently takes.  Pt has been using the anoro every day in the evenings due to it helping him breathe better and sleep better at night. Pt has had to use the rescue inhaler on average 1-2 times most days.  Denies any complaints of fever, has not checked temp, but does not feel like he is running a fever.  Pt has not taken any OTC ibuprofen or tylenol but does take oxycodone-acetaminophen daily for pain.  Pt would like to know if there is anything that could be prescribed to help with symptoms.  Dr. Elsworth Soho, please advise.

## 2020-03-25 NOTE — Telephone Encounter (Signed)
Doxycycline 100 milligrams twice daily for 7 days Prednisone 10 mg tabs  Take 2 tabs daily with food x 5ds, then 1 tab daily with food x 5ds then STOP

## 2020-03-26 ENCOUNTER — Ambulatory Visit: Payer: Self-pay

## 2020-03-26 ENCOUNTER — Other Ambulatory Visit: Payer: Self-pay

## 2020-03-26 ENCOUNTER — Encounter: Payer: Self-pay | Admitting: Physical Medicine and Rehabilitation

## 2020-03-26 ENCOUNTER — Ambulatory Visit (INDEPENDENT_AMBULATORY_CARE_PROVIDER_SITE_OTHER): Payer: 59 | Admitting: Physical Medicine and Rehabilitation

## 2020-03-26 VITALS — BP 113/52 | HR 65

## 2020-03-26 DIAGNOSIS — M5116 Intervertebral disc disorders with radiculopathy, lumbar region: Secondary | ICD-10-CM

## 2020-03-26 DIAGNOSIS — M48062 Spinal stenosis, lumbar region with neurogenic claudication: Secondary | ICD-10-CM

## 2020-03-26 MED ORDER — METHYLPREDNISOLONE ACETATE 80 MG/ML IJ SUSP: 80.0000 mg | Freq: Once | INTRAMUSCULAR | Status: DC

## 2020-03-26 NOTE — Progress Notes (Signed)
Pt state lower back pain that travels down his left buttocks and down his positier left leg. Pt state walking and standing makes the pain worse. Pt state he lay down and take pain meds to help ease the pain.  Numeric Pain Rating Scale and Functional Assessment Average Pain 5   In the last MONTH (on 0-10 scale) has pain interfered with the following?  1. General activity like being  able to carry out your everyday physical activities such as walking, climbing stairs, carrying groceries, or moving a chair?  Rating(10)   +Driver, -BT, -Dye Allergies. Latex Allergies

## 2020-04-02 ENCOUNTER — Encounter: Payer: Self-pay | Admitting: Specialist

## 2020-04-02 ENCOUNTER — Ambulatory Visit (INDEPENDENT_AMBULATORY_CARE_PROVIDER_SITE_OTHER): Payer: 59 | Admitting: Specialist

## 2020-04-02 ENCOUNTER — Other Ambulatory Visit: Payer: Self-pay

## 2020-04-02 VITALS — BP 126/70 | HR 69 | Ht 75.0 in | Wt 270.0 lb

## 2020-04-02 DIAGNOSIS — M48062 Spinal stenosis, lumbar region with neurogenic claudication: Secondary | ICD-10-CM

## 2020-04-02 NOTE — Patient Instructions (Signed)
Avoid bending, stooping and avoid lifting weights greater than 10 lbs. Avoid prolong standing and walking. Avoid frequent bending and stooping  No lifting greater than 10 lbs. May use ice or moist heat for pain. Weight loss is of benefit. Handicap license is approved. Dr. Romona Curls secretary/Assistant will call to arrange for repeat left L3-4 Transforamenal epidural steroid injection

## 2020-04-02 NOTE — Progress Notes (Signed)
Office Visit Note   Patient: [name]           Date of Birth: October 19, 1955           MRN: [id number] Visit Date: 04/02/2020              Requested by: Timoteo Gaul, [place],  [place] [id number] PCP: Timoteo Gaul, FNP   Assessment & Plan: Visit Diagnoses:  1. Spinal stenosis, lumbar region with neurogenic claudication     Plan: Avoid bending, stooping and avoid lifting weights greater than 10 lbs. Avoid prolong standing and walking. Avoid frequent bending and stooping  No lifting greater than 10 lbs. May use ice or moist heat for pain. Weight loss is of benefit. Handicap license is approved. Dr. Romona Curls secretary/Assistant will call to arrange for repeat left L3-4 Transforamenal epidural steroid injection   Follow-Up Instructions: Return in about 4 weeks (around 04/30/2020).   Orders:  Orders Placed This Encounter  Procedures  . Ambulatory referral to Physical Medicine Rehab   No orders of the defined types were placed in this encounter.     Procedures: No procedures performed   Clinical Data: Findings:  CLINICAL DATA: Spinal stenosis, lower back pain.  EXAM: MRI LUMBAR SPINE WITHOUT CONTRAST  TECHNIQUE: Multiplanar, multisequence MR imaging of the lumbar spine was performed. No intravenous contrast was administered.  COMPARISON: 02/06/2020 lumbar spine radiographs. 05/06/2017 MRI lumbar spine and prior.  FINDINGS: Segmentation: Standard.  Alignment: Straightening of lordosis. Grade 1 L3-4 retrolisthesis.  Vertebrae: Multilevel Modic type 2 endplate degenerative changes. Multilevel Schmorl's node formation. Scattered hemangiomata versus focal fat.  Conus medullaris and cauda equina: Conus extends to the L1 level. Conus and cauda equina appear normal.  Disc levels: Multilevel desiccation disc space loss most prominent at the L3-4 and L5-S1 levels.  L1-2: Disc bulge with small central protrusion, ligamentum  flavum and bilateral facet hypertrophy. Mild spinal canal and bilateral neural foraminal narrowing.  L2-3: Disc bulge, ligamentum flavum and bilateral facet hypertrophy. Mild spinal canal, moderate right and mild left neural foraminal narrowing..  L3-4: Disc bulge abutting the ventral thecal sac with prominent subarticular components effacing the lateral recess. There is abutment of the exiting L3 and descending L4 nerve roots. Bilateral facet hypertrophy. Moderate spinal canal and bilateral neural foraminal narrowing, unchanged.  L4-5: Disc bulge and bilateral facet hypertrophy. Small left foraminal protrusion grazing the exiting L4 nerve root. Patent spinal canal. Moderate bilateral neural foraminal narrowing.  L5-S1: Bilateral pars defects. Disc bulge with superimposed central protrusion/annular fissuring. Bilateral facet hypertrophy. Patent spinal canal. Mild bilateral neural foraminal narrowing.  Paraspinal and other soft tissues: Bilateral renal cysts.  IMPRESSION: Multilevel spondylosis, grossly unchanged.  Moderate L3-4 and mild L1-3 spinal canal narrowing.  Moderate right L2-3 and bilateral L3-5 neural foraminal narrowing.  Mild bilateral L1-2, L5-S1 neural foraminal narrowing.   Electronically Signed By: Primitivo Gauze M.D. On: 03/01/2020 10:46    Subjective: Chief Complaint  Patient presents with  . Lower Back - Follow-up    Had Bilat L4 TF injections with Dr. Ernestina Patches and it has not helped his back but his right leg is 80% better, left leg has good and bad days, noticed if he leans to the right and stretches out his left leg he gets relief from pain.    64 year old male with history of COPD and smoker, he has pain in the back and radiation into the legs. Underwent right and left ESIs. The  right leg pain is relieved and the Left leg was doing well for 2-3 days and then the pain recurred. His pain is left lateral thigh and into the left knee. The ESIs  caused increased BG and he changed his insulin And brought about hypoglycemia.    Review of Systems  Constitutional: Negative.   HENT: Negative.   Eyes: Negative.   Respiratory: Negative.   Cardiovascular: Negative.   Gastrointestinal: Negative.   Endocrine: Negative.   Genitourinary: Negative.   Musculoskeletal: Negative.   Skin: Negative.   Allergic/Immunologic: Negative.   Neurological: Negative.   Hematological: Negative.   Psychiatric/Behavioral: Negative.      Objective: Vital Signs: BP 126/70 (BP Location: Left Arm, Patient Position: Sitting)   Pulse 69   Ht 6\' 3"  (1.905 m)   Wt 270 lb (122.5 kg)   BMI 33.75 kg/m   Physical Exam Constitutional:      Appearance: He is well-developed.  HENT:     Head: Normocephalic and atraumatic.  Eyes:     Pupils: Pupils are equal, round, and reactive to light.  Pulmonary:     Effort: Pulmonary effort is normal.     Breath sounds: Normal breath sounds.  Abdominal:     General: Bowel sounds are normal.     Palpations: Abdomen is soft.  Musculoskeletal:     Cervical back: Normal range of motion and neck supple.  Skin:    General: Skin is warm and dry.  Neurological:     Mental Status: He is alert and oriented to person, place, and time.  Psychiatric:        Behavior: Behavior normal.        Thought Content: Thought content normal.        Judgment: Judgment normal.     Back Exam   Range of Motion  Extension: abnormal  Flexion: normal  Lateral bend right: abnormal  Lateral bend left: abnormal  Rotation right: abnormal  Rotation left: abnormal   Muscle Strength  Right Quadriceps:  5/5  Left Quadriceps:  5/5  Right Hamstrings:  5/5  Left Hamstrings:  5/5   Reflexes  Patellar: 1/4 Achilles: 0/4 Babinski's sign: normal   Comments:  Weak in bilateral hip flexors and right knee extension 4/5.      Specialty Comments:  No specialty comments available.  Imaging: No results found.   PMFS  History: Patient Active Problem List   Diagnosis Date Noted  . LVH (left ventricular hypertrophy) 11/07/2019  . Heme positive stool 10/03/2019  . Anemia 10/03/2019  . Insulin dependent type 2 diabetes mellitus ([place]) 08/26/2019  . Acute on chronic diastolic heart failure ([place]) 08/26/2019  . Rash and nonspecific skin eruption 08/26/2019  . Foot pain 08/22/2019  . Right heart failure ([place]) 08/07/2019  . Leg swelling 07/28/2019  . Educated about COVID-19 virus infection 07/28/2019  . Diabetes ([place]) 06/12/2019  . Urticaria 05/15/2019  . Dermatitis due to drug 04/18/2019  . Edema 03/29/2019  . Erectile dysfunction 03/29/2019  . High risk medication use 03/29/2019  . History of iron deficiency 03/29/2019  . Pulmonary hypertension ([place]) 03/29/2019  . Venous stasis dermatitis of both lower extremities 03/29/2019  . Mixed hyperlipidemia 02/14/2019  . Onychomycosis due to dermatophyte 11/06/2018  . Hammer toe of right foot 07/17/2018  . Tinea pedis of both feet 07/17/2018  . History of radiofrequency ablation (RFA) procedure for cardiac arrhythmia 06/21/2018  . Lumbar back pain with radiculopathy affecting lower extremity 06/21/2018  . Hepatic cirrhosis ([place]) 12/26/2017  .  Left lower quadrant pain 12/05/2017  . Change in bowel habits 12/05/2017  . Palpitations 10/03/2017  . SOB (shortness of breath) 04/12/2017  . Chest pain with moderate risk of acute coronary syndrome 08/28/2016  . Unstable angina ([place]) 08/26/2016  . Longstanding persistent atrial fibrillation ([place]) 04/21/2015  . Deviated nasal septum 02/19/2014  . History of colon polyps 11/22/2012  . Syncope 10/17/2012  . Acute asthmatic bronchitis 11/22/2011  . Benign neoplasm of colon 10/13/2011  . Unspecified gastritis and gastroduodenitis without mention of hemorrhage 10/13/2011  . GERD (gastroesophageal reflux disease) 10/13/2011  . Obesity (BMI 30-39.9) 09/30/2011  . Dyslipidemia 06/23/2011  . Neuropathy 06/23/2011  .  Fatigue 05/13/2011  . Malaise and fatigue 05/13/2011  . Rectal bleeding 04/18/2011  . Atrial flutter ([place]) 04/18/2011  . Chronic anticoagulation 11/10/2010  . PAF (paroxysmal atrial fibrillation) ([place])   . Tobacco abuse   . COPD (chronic obstructive pulmonary disease) ([place]) 02/11/2010  . Essential hypertension 01/28/2010  . OSA (obstructive sleep apnea) 01/28/2010   Past Medical History:  Diagnosis Date  . Arthritis    lower back  . Asthma   . Atrial flutter ([place])    s/p CTI ablation by Dr Rayann Heman  . Brain aneurysm 2009   questionable. A follow up CTA in 2009 showed no evidence of  . Chronic back pain    DDD/stenosis  . Colon polyps    9 polyps removed 10/13/11  . Complication of anesthesia September 11, 2012   slow to awaken after ablation  . COPD (chronic obstructive pulmonary disease) ([place])   . Diabetes mellitus    takes Metformin and Glimepiride daily  . Emphysema   . GERD (gastroesophageal reflux disease)    takes Omeprazole bid  . History of shingles   . HLD (hyperlipidemia)    takes Pravastatin daily  . HTN (hypertension)    takes Prinizide daily  . Obesity   . OSA (obstructive sleep apnea)    not always using cpap  . Overdose 2009   unintentional Flecanide overdose  . Peripheral neuropathy   . Short-term memory loss   . Tobacco abuse     Family History  Problem Relation Age of Onset  . Diabetes Mother   . Heart disease Father   . Lung cancer Father   . Diabetes Maternal Grandmother   . Colon cancer Neg Hx   . Anesthesia problems Neg Hx   . Hypotension Neg Hx   . Malignant hyperthermia Neg Hx   . Pseudochol deficiency Neg Hx   . Esophageal cancer Neg Hx   . Pancreatic cancer Neg Hx   . Stomach cancer Neg Hx     Past Surgical History:  Procedure Laterality Date  . ANTERIOR CERVICAL DECOMP/DISCECTOMY FUSION  01/04/2012   Procedure: ANTERIOR CERVICAL DECOMPRESSION/DISCECTOMY FUSION 1 LEVEL/HARDWARE REMOVAL;  Surgeon: Ophelia Charter, MD;  Location: [facility];  Service: Neurosurgery;  Laterality: N/A;  explore cervical fusion Cervical six - seven  with removal of codman plate anterior cervical decompression with fusion interbody prothesis plating and bonegraft  . APPENDECTOMY  2-108yrs ago  . ATRIAL FIBRILLATION ABLATION N/A 09/11/2012   PT DID NOT HAVE AN ATRIAL FIBRILLATION ABLATION IN 2014!  ATRIAL FLUTTER ABLATION ONLY  . ATRIAL FLUTTER ABLATION  09/11/2012   CTI ablation by Dr Rayann Heman  . BIOPSY  11/07/2019   Procedure: BIOPSY;  Surgeon: Milus Banister, MD;  Location: [facility] END[facility]COPY;  Service: Endoscopy;;  . CARDIAC CATHETERIZATION  2008   no significant CAD  .  CARDIOVERSION  05/05/2011   Procedure: CARDIOVERSION;  Surgeon: Lelon Perla, MD;  Location: [facility] ENDOSCOPY;  Service: Cardiovascular;  Laterality: N/A;  . CARDIOVERSION Bilateral 07/26/2012   Procedure: CARDIOVERSION;  Surgeon: Minus Breeding, MD;  Location: [facility] ENDOSCOPY;  Service: Cardiovascular;  Laterality: Bilateral;  . CARPAL TUNNEL RELEASE  99/2000   bilateral  . COLONOSCOPY WITH PROPOFOL N/A 12/13/2012   Procedure: COLONOSCOPY WITH PROPOFOL;  Surgeon: Milus Banister, MD;  Location: [facility] ENDOSCOPY;  Service: Endoscopy;  Laterality: N/A;  . ELECTROPHYSIOLOGIC STUDY N/A 04/21/2015   Procedure: Atrial Fibrillation Ablation;  Surgeon: Thompson Grayer, MD;  Location: [facility] LAB;  Service: Cardiovascular;  Laterality: N/A;  . ESOPHAGOGASTRODUODENOSCOPY (EGD) WITH PROPOFOL N/A 11/07/2019   Procedure: ESOPHAGOGASTRODUODENOSCOPY (EGD) WITH PROPOFOL;  Surgeon: Milus Banister, MD;  Location: [facility] ENDOSCOPY;  Service: Endoscopy;  Laterality: N/A;  . HERNIA REPAIR    . KNEE SURGERY  6-65yrs ago   left  . LEFT HEART CATH AND CORONARY ANGIOGRAPHY N/A 09/19/2016   Procedure: Left Heart Cath and Coronary Angiography;  Surgeon: Belva Crome, MD;  Location: [facility];  Service: Cardiovascular;  Laterality: N/A;  . Left inguinal hernia repair     as a child  . NASAL SEPTOPLASTY W/  TURBINOPLASTY Bilateral 02/19/2014   Procedure: NASAL SEPTOPLASTY WITH BILATERAL TURBINATE REDUCTION;  Surgeon: Jodi Marble, MD;  Location: [facility];  Service: ENT;  Laterality: Bilateral;  . NECK SURGERY  67yrs ago  . right shoulder surgery  4-8yrs ago   cyst removed  . TEE WITHOUT CARDIOVERSION  05/05/2011   Procedure: TRANSESOPHAGEAL ECHOCARDIOGRAM (TEE);  Surgeon: Lelon Perla, MD;  Location: [facility] ENDOSCOPY;  Service: Cardiovascular;  Laterality: N/A;  . TEE WITHOUT CARDIOVERSION N/A 04/21/2015   Procedure: TRANSESOPHAGEAL ECHOCARDIOGRAM (TEE);  Surgeon: Sueanne Margarita, MD;  Location: [facility] ENDOSCOPY;  Service: Cardiovascular;  Laterality: N/A;  . THROAT SURGERY  4-64yrs ago   "thought " it was cancer but came back not  . TONSILLECTOMY     Social History   Occupational History  . Occupation: Dealer   Tobacco Use  . Smoking status: Current Every Day Smoker    Packs/day: 1.00    Years: 49.00    Pack years: 49.00    Types: Cigarettes  . Smokeless tobacco: Never Used  . Tobacco comment: 1 pack per day 12/02/19 [facility]   Vaping Use  . Vaping Use: Never used  Substance and Sexual Activity  . Alcohol use: Not Currently    Alcohol/week: 0.0 standard drinks    Comment: 12/05/17 pt stated he has drinked 1/2 drink in 3 years  . Drug use: No  . Sexual activity: Yes

## 2020-04-07 ENCOUNTER — Other Ambulatory Visit (INDEPENDENT_AMBULATORY_CARE_PROVIDER_SITE_OTHER): Payer: 59

## 2020-04-07 DIAGNOSIS — K7460 Unspecified cirrhosis of liver: Secondary | ICD-10-CM

## 2020-04-07 LAB — COMPREHENSIVE METABOLIC PANEL
ALT: 25 U/L (ref 0–53)
AST: 38 U/L — ABNORMAL HIGH (ref 0–37)
Albumin: 4 g/dL (ref 3.5–5.2)
Alkaline Phosphatase: 125 U/L — ABNORMAL HIGH (ref 39–117)
BUN: 32 mg/dL — ABNORMAL HIGH (ref 6–23)
CO2: 33 mEq/L — ABNORMAL HIGH (ref 19–32)
Calcium: 9.5 mg/dL (ref 8.4–10.5)
Chloride: 100 mEq/L (ref 96–112)
Creatinine, Ser: 1.36 mg/dL (ref 0.40–1.50)
GFR: 55.82 mL/min — ABNORMAL LOW (ref >60.00)
Glucose, Bld: 263 mg/dL — ABNORMAL HIGH (ref 70–99)
Potassium: 3.8 mEq/L (ref 3.5–5.1)
Sodium: 139 mEq/L (ref 135–145)
Total Bilirubin: 0.5 mg/dL (ref 0.2–1.2)
Total Protein: 7.3 g/dL (ref 6.0–8.3)

## 2020-04-07 LAB — CBC
HCT: 34.9 % — ABNORMAL LOW (ref 39.0–52.0)
Hemoglobin: 11.5 g/dL — ABNORMAL LOW (ref 13.0–17.0)
MCHC: 33 g/dL (ref 30.0–36.0)
MCV: 91.1 fl (ref 78.0–100.0)
Platelets: 123 10*3/uL — ABNORMAL LOW (ref 150.0–400.0)
RBC: 3.84 Mil/uL — ABNORMAL LOW (ref 4.22–5.81)
RDW: 18.5 % — ABNORMAL HIGH (ref 11.5–15.5)
WBC: 6.3 10*3/uL (ref 4.0–10.5)

## 2020-04-07 LAB — PROTIME-INR
INR: 2.4 ratio — ABNORMAL HIGH (ref 0.8–1.0)
Prothrombin Time: 27.1 s — ABNORMAL HIGH (ref 9.6–13.1)

## 2020-04-08 ENCOUNTER — Telehealth: Payer: Self-pay

## 2020-04-08 NOTE — Telephone Encounter (Signed)
Called pt back and sch 12/20.

## 2020-04-08 NOTE — Telephone Encounter (Signed)
Patient called he stated he was returning a phone call. CB:[phone]

## 2020-04-13 ENCOUNTER — Ambulatory Visit (INDEPENDENT_AMBULATORY_CARE_PROVIDER_SITE_OTHER): Payer: 59 | Admitting: Endocrinology

## 2020-04-13 ENCOUNTER — Other Ambulatory Visit: Payer: Self-pay

## 2020-04-13 ENCOUNTER — Encounter: Payer: Self-pay | Admitting: Endocrinology

## 2020-04-13 VITALS — BP 152/80 | HR 62 | Wt 267.0 lb

## 2020-04-13 DIAGNOSIS — N1831 Chronic kidney disease, stage 3a: Secondary | ICD-10-CM | POA: Diagnosis not present

## 2020-04-13 DIAGNOSIS — Z794 Long term (current) use of insulin: Secondary | ICD-10-CM | POA: Diagnosis not present

## 2020-04-13 DIAGNOSIS — E1122 Type 2 diabetes mellitus with diabetic chronic kidney disease: Secondary | ICD-10-CM

## 2020-04-13 LAB — POCT GLYCOSYLATED HEMOGLOBIN (HGB A1C): Hemoglobin A1C: 8.7 % — AB (ref 4.0–5.6)

## 2020-04-13 MED ORDER — FREESTYLE LIBRE 2 READER DEVI: 1.0000 | Freq: Once | 1 refills | Status: AC

## 2020-04-13 MED ORDER — FREESTYLE LIBRE 2 SENSOR MISC: 1.0000 | 3 refills | Status: DC

## 2020-04-13 NOTE — Patient Instructions (Addendum)
Your blood pressure is high today.  Please see your primary care provider soon, to have it rechecked check your blood sugar twice a day.  vary the time of day when you check, between before the 3 meals, and at bedtime.  also check if you have symptoms of your blood sugar being too high or too low.  please keep a record of the readings and bring it to your next appointment here (or you can bring the meter itself).  You can write it on any piece of paper.  please call us sooner if your blood sugar goes below 70, or if you have a lot of readings over 200. Please continue the same diabetes medications.  Take the Novolog during rather than before a meal.   Please come back for a follow-up appointment in 2 months.

## 2020-04-13 NOTE — Progress Notes (Signed)
Subjective:    Patient ID: [name], male    DOB: 1955/12/20, 64 y.o.   MRN: [id number]  HPI Pt returns for f/u of diabetes mellitus:  DM type: Insulin-requiring type 2 Dx'ed: 7124 Complications: stage 3 CRI.   Therapy: insulin since 2019.   DKA: never Severe hypoglycemia: once (2021, when a meal was delayed after taking Novolog) Pancreatitis: never Pancreatic imaging: normal on 2017 CT.  SDOH: he declines insulins other than Novolog. Other: he takes multiple daily injections; he did not tolerate Jardiance (dehydration) or time-release insulins (rash); metformin dosage is limited by abd pain; Interval history: Pt had an episode of severe hypoglycemia last week.  This happened at [facility].  This happened after he took Novolog, but a meal was delayed.  says he takes Novolog 80 units 2-3 times a day (just before each meal).  no cbg record, but states cbg's vary from 68-500.  He had 2 steroid injections approx 10 days ago.   Past Medical History:  Diagnosis Date  . Arthritis    lower back  . Asthma   . Atrial flutter ([place])    s/p CTI ablation by Dr Rayann Heman  . Brain aneurysm 2009   questionable. A follow up CTA in 2009 showed no evidence of  . Chronic back pain    DDD/stenosis  . Colon polyps    9 polyps removed 10/13/11  . Complication of anesthesia September 11, 2012   slow to awaken after ablation  . COPD (chronic obstructive pulmonary disease) ([place])   . Diabetes mellitus    takes Metformin and Glimepiride daily  . Emphysema   . GERD (gastroesophageal reflux disease)    takes Omeprazole bid  . History of shingles   . HLD (hyperlipidemia)    takes Pravastatin daily  . HTN (hypertension)    takes Prinizide daily  . Obesity   . OSA (obstructive sleep apnea)    not always using cpap  . Overdose 2009   unintentional Flecanide overdose  . Peripheral neuropathy   . Short-term memory loss   . Tobacco abuse     Past Surgical History:  Procedure Laterality Date  . ANTERIOR  CERVICAL DECOMP/DISCECTOMY FUSION  01/04/2012   Procedure: ANTERIOR CERVICAL DECOMPRESSION/DISCECTOMY FUSION 1 LEVEL/HARDWARE REMOVAL;  Surgeon: Ophelia Charter, MD;  Location: [place] [facility]UR[facility] ORS;  Service: Neurosurgery;  Laterality: N/A;  explore cervical fusion Cervical six - seven  with removal of codman plate anterior cervical decompression with fusion interbody prothesis plating and bonegraft  . APPENDECTOMY  2-34yrs ago  . ATRIAL FIBRILLATION ABLATION N/A 09/11/2012   PT DID NOT HAVE AN ATRIAL FIBRILLATION ABLATION IN 2014!  ATRIAL FLUTTER ABLATION ONLY  . ATRIAL FLUTTER ABLATION  09/11/2012   CTI ablation by Dr Rayann Heman  . BIOPSY  11/07/2019   Procedure: BIOPSY;  Surgeon: Milus Banister, MD;  Location: [facility] ENDOSCOPY;  Service: Endoscopy;;  . CARDIAC CATHETERIZATION  2008   no significant CAD  . CARDIOVERSION  05/05/2011   Procedure: CARDIOVERSION;  Surgeon: Lelon Perla, MD;  Location: [facility] ENDOSCOPY;  Service: Cardiovascular;  Laterality: N/A;  . CARDIOVERSION Bilateral 07/26/2012   Procedure: CARDIOVERSION;  Surgeon: Minus Breeding, MD;  Location[facility] ENDOSCOPY;  Service: Cardiovascular;  Laterality: Bilateral;  . CARPAL TUNNEL RELEASE  99/2000   bilateral  . COLONOSCOPY WITH PROPOFOL N/A 12/13/2012   Procedure: COLONOSCOPY WITH PROPOFOL;  Surgeon: Milus Banister, MD;  Location: [facility] ENDOSCOPY;  Service: Endoscopy;  Laterality: N/A;  . ELECTROPHYSIOLOGIC STUDY N/A 04/21/2015  Procedure: Atrial Fibrillation Ablation;  Surgeon: Thompson Grayer, MD;  Location: [place] CV LAB;  Service: Cardiovascular;  Laterality: N/A;  . ESOPHAGOGASTRODUODENOSCOPY (EGD) WITH PROPOFOL N/A 11/07/2019   Procedure: ESOPHAGOGASTRODUODENOSCOPY (EGD) WITH PROPOFOL;  Surgeon: Milus Banister, MD;  Location: [facility] ENDOSCOPY;  Service: Endoscopy;  Laterality: N/A;  . HERNIA REPAIR    . KNEE SURGERY  6-12yrs ago   left  . LEFT HEART CATH AND CORONARY ANGIOGRAPHY N/A 09/19/2016   Procedure: Left Heart Cath and Coronary  Angiography;  Surgeon: Belva Crome, MD;  Location: [place] CV LAB;  Service: Cardiovascular;  Laterality: N/A;  . Left inguinal hernia repair     as a child  . NASAL SEPTOPLASTY W/ TURBINOPLASTY Bilateral 02/19/2014   Procedure: NASAL SEPTOPLASTY WITH BILATERAL TURBINATE REDUCTION;  Surgeon: Jodi Marble, MD;  Location: [place];  Service: ENT;  Laterality: Bilateral;  . NECK SURGERY  12yrs ago  . right shoulder surgery  4-89yrs ago   cyst removed  . TEE WITHOUT CARDIOVERSION  05/05/2011   Procedure: TRANSESOPHAGEAL ECHOCARDIOGRAM (TEE);  Surgeon: Lelon Perla, MD;  Location: [facility] ENDOSCOPY;  Service: Cardiovascular;  Laterality: N/A;  . TEE WITHOUT CARDIOVERSION N/A 04/21/2015   Procedure: TRANSESOPHAGEAL ECHOCARDIOGRAM (TEE);  Surgeon: Sueanne Margarita, MD;  Location: [facility] ENDOSCOPY;  Service: Cardiovascular;  Laterality: N/A;  . THROAT SURGERY  4-46yrs ago   "thought " it was cancer but came back not  . TONSILLECTOMY      Social History   Socioeconomic History  . Marital status: Married    Spouse name: Not on file  . Number of children: 2  . Years of education: Not on file  . Highest education level: Not on file  Occupational History  . Occupation: Dealer   Tobacco Use  . Smoking status: Current Every Day Smoker    Packs/day: 1.00    Years: 49.00    Pack years: 49.00    Types: Cigarettes  . Smokeless tobacco: Never Used  . Tobacco comment: 1 pack per day 12/02/19 [facility]   Vaping Use  . Vaping Use: Never used  Substance and Sexual Activity  . Alcohol use: Not Currently    Alcohol/week: 0.0 standard drinks    Comment: 12/05/17 pt stated he has drinked 1/2 drink in 3 years  . Drug use: No  . Sexual activity: Yes  Other Topics Concern  . Not on file  Social History Narrative   Daily caffeine(Mountain Dew)       Lives in [place] with spouse.   Unemployed due to chronic back/ leg pain         Social Determinants of Health   Financial Resource Strain:   .  Difficulty of Paying Living Expenses: Not on file  Food Insecurity:   . Worried About Charity fundraiser in the Last Year: Not on file  . Ran Out of Food in the Last Year: Not on file  Transportation Needs:   . Lack of Transportation (Medical): Not on file  . Lack of Transportation (Non-Medical): Not on file  Physical Activity:   . Days of Exercise per Week: Not on file  . Minutes of Exercise per Session: Not on file  Stress:   . Feeling of Stress : Not on file  Social Connections:   . Frequency of Communication with Friends and Family: Not on file  . Frequency of Social Gatherings with Friends and Family: Not on file  . Attends Religious Services: Not on file  .  Active Member of Clubs or Organizations: Not on file  . Attends Archivist Meetings: Not on file  . Marital Status: Not on file  Intimate Partner Violence:   . Fear of Current or Ex-Partner: Not on file  . Emotionally Abused: Not on file  . Physically Abused: Not on file  . Sexually Abused: Not on file    Current Outpatient Medications on File Prior to Visit  Medication Sig Dispense Refill  . albuterol (PROAIR HFA) 108 (90 Base) MCG/ACT inhaler Inhale 1-2 puffs into the lungs every 6 (six) hours as needed for wheezing or shortness of breath. 18 g 5  . Ascorbic Acid (VITAMIN C PO) Take 1,000 mg by mouth daily.     [name] b complex vitamins tablet Take 1 tablet by mouth daily.     . B-D UF III MINI PEN NEEDLES 31G X 5 MM MISC 1 Device by Other route 3 (three) times daily with meals. Use three times daily to inject insulin. 270 each 3  . bisoprolol (ZEBETA) 10 MG tablet Take 10 mg by mouth daily.     [name] diltiazem (TIAZAC) 120 MG 24 hr capsule Take 1 capsule by mouth daily.     [name] doxycycline (VIBRA-TABS) 100 MG tablet Take 1 tablet (100 mg total) by mouth 2 (two) times daily. 14 tablet 0  . ferrous sulfate 324 MG TBEC Take 324 mg by mouth daily.     [name] gabapentin (NEURONTIN) 300 MG capsule Take by mouth.    . gabapentin  (NEURONTIN) 300 MG capsule Take 1 capsule (300 mg total) by mouth 2 (two) times daily. History of CHF and cirrhosis and renal insufficiency requires less dose. 60 capsule 3  . Insulin Aspart FlexPen 100 UNIT/ML SOPN INJECT 80 UNITS SUBCUTANEOUSLY 3 TIMES DAILY WITH MEALS 225 mL 3  . levocetirizine (XYZAL) 5 MG tablet Take 5 mg by mouth every evening.     [name] losartan (COZAAR) 50 MG tablet Take 1 tablet (50 mg total) by mouth daily. 90 tablet 3  . metFORMIN (GLUCOPHAGE-XR) 500 MG 24 hr tablet Take 1 tablet (500 mg total) by mouth at bedtime. 90 tablet 3  . metolazone (ZAROXOLYN) 2.5 MG tablet TAKE 1 TABLET ON Tuesday AND Thursday 30 MINUTES BEFORE TAKING TORSEMIDE OR AS DIRECTED 15 tablet 3  . Multiple Vitamin (MULTIVITAMIN WITH MINERALS) TABS Take 1 tablet by mouth daily.     . Multiple Vitamins-Minerals (ZINC PO) Take 50 mg by mouth daily.     . nitroGLYCERIN (NITROSTAT) 0.4 MG SL tablet Place 1 tablet (0.4 mg total) under the tongue every 5 (five) minutes x 3 doses as needed for chest pain. 25 tablet 3  . oxyCODONE-acetaminophen (PERCOCET) 10-325 MG tablet Take 1 tablet by mouth 4 (four) times daily as needed.    . pantoprazole (PROTONIX) 20 MG tablet Take 20 mg by mouth 2 (two) times daily.     . potassium chloride SA (KLOR-CON) 20 MEQ tablet TAKE 1 TABLET BY MOUTH DAILY. TAKE AN EXTRA TABLET ON TUESDAY AND THURSDAY 40 tablet 11  . pravastatin (PRAVACHOL) 80 MG tablet TAKE 1 TABLET BY MOUTH DAILY AT BEDTIME 30 tablet 10  . sildenafil (REVATIO) 20 MG tablet Take 100 mg by mouth daily as needed (erectile dysfunction).     . torsemide (DEMADEX) 20 MG tablet TAKE 2 TABLETS BY MOUTH 2 TIMES DAILY 270 tablet 3  . triamcinolone cream (KENALOG) 0.1 % Apply 1 application topically daily as needed. 453 g 2  . umeclidinium-vilanterol (  ANORO ELLIPTA) 62.5-25 MCG/INH AEPB Inhale 1 puff into the lungs daily. 60 each 5  . VITAMIN A PO Take 2,400 mcg by mouth daily.     . Vitamin D, Ergocalciferol, (DRISDOL) 1.25  MG (50000 UT) CAPS capsule Take 50,000 Units by mouth every 7 (seven) days.     [name] warfarin (COUMADIN) 5 MG tablet Take 2.5-5 mg by mouth See admin instructions. Tale 2.5 mg on Monday Take 5 mg every other day     Current Facility-Administered Medications on File Prior to Visit  Medication Dose Route Frequency Provider Last Rate Last Admin  . methylPREDNISolone acetate (DEPO-MEDROL) injection 80 mg  80 mg Other Once Magnus Sinning, MD        Allergies  Allergen Reactions  . Adhesive [Tape]     itching  . Latex Itching    When tape is on the skin too long skin gets red & itching    Family History  Problem Relation Age of Onset  . Diabetes Mother   . Heart disease Father   . Lung cancer Father   . Diabetes Maternal Grandmother   . Colon cancer Neg Hx   . Anesthesia problems Neg Hx   . Hypotension Neg Hx   . Malignant hyperthermia Neg Hx   . Pseudochol deficiency Neg Hx   . Esophageal cancer Neg Hx   . Pancreatic cancer Neg Hx   . Stomach cancer Neg Hx     BP (!) 152/80   Pulse 62   Wt 267 lb (121.1 kg)   SpO2 94%   BMI 33.37 kg/m    Review of Systems No weight change.      Objective:   Physical Exam VITAL SIGNS:  See vs page GENERAL: no distress Pulses: dorsalis pedis intact bilat.   MSK: no deformity of the feet CV: 1+ bilat leg edema Skin:  no ulcer on the feet, but the skin is dry.  normal temp on the feet.  Neuro: sensation is intact to touch on the feet, but decreased from normal.   Ext: there is bilateral onychomycosis of the toenails.    Lab Results  Component Value Date   HGBA1C 8.7 (A) 04/13/2020       Assessment & Plan:  Insulin-requiring type 2 DM, with stage 3 CRI: uncontrolled.  Poss worse due to steroid injection.  Hypoglycemia, severe, due to insulin: this limits aggressiveness of glycemic control.  HTN: is noted today  Patient Instructions  Your blood pressure is high today.  Please see your primary care provider soon, to have it  rechecked check your blood sugar twice a day.  vary the time of day when you check, between before the 3 meals, and at bedtime.  also check if you have symptoms of your blood sugar being too high or too low.  please keep a record of the readings and bring it to your next appointment here (or you can bring the meter itself).  You can write it on any piece of paper.  please call us sooner if your blood sugar goes below 70, or if you have a lot of readings over 200. Please continue the same diabetes medications.  Take the Novolog during rather than before a meal.   Please come back for a follow-up appointment in 2 months.

## 2020-04-14 ENCOUNTER — Telehealth: Payer: Self-pay | Admitting: Gastroenterology

## 2020-04-14 NOTE — Telephone Encounter (Signed)
Okay, thank you.  Sorry he hung up on you.  Hopefully he will show for his appointment.  He sounds pretty dissatisfied with [place].  He has had similar dissatisfactions with several other providers, see my note from our last office visit.

## 2020-04-14 NOTE — Telephone Encounter (Signed)
The pt called to state that he is having problems swallowing solids.  He has an appt on 12/15 in the office for follow up.  I asked if he was having problems swallowing solids or liquids and he states he can swallow liquids and soft foods.  He says he has pain when swallowing.   I advised him to chew his food well and I will send a message to Dr Ardis Hughs with recommendations and he told me that we have never been there to help him when he needs it and we are useless.  I tried to explain that I can not order any medications or testing.  I was in the middle of telling him that Dr Ardis Hughs would need to be advised and make recommendations and he hung up on me.

## 2020-04-15 ENCOUNTER — Ambulatory Visit (INDEPENDENT_AMBULATORY_CARE_PROVIDER_SITE_OTHER): Payer: 59 | Admitting: Podiatry

## 2020-04-15 ENCOUNTER — Encounter: Payer: Self-pay | Admitting: Podiatry

## 2020-04-15 ENCOUNTER — Other Ambulatory Visit: Payer: Self-pay

## 2020-04-15 DIAGNOSIS — Z7901 Long term (current) use of anticoagulants: Secondary | ICD-10-CM

## 2020-04-15 DIAGNOSIS — B351 Tinea unguium: Secondary | ICD-10-CM

## 2020-04-15 DIAGNOSIS — E0843 Diabetes mellitus due to underlying condition with diabetic autonomic (poly)neuropathy: Secondary | ICD-10-CM

## 2020-04-15 DIAGNOSIS — M79676 Pain in unspecified toe(s): Secondary | ICD-10-CM | POA: Diagnosis not present

## 2020-04-15 NOTE — Progress Notes (Signed)
This patient returns to my office for at risk foot care.  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   Patient is taking coumadin.  This patient is unable to cut nails himself since the patient cannot reach his nails.These nails are painful walking and wearing shoes.  This patient presents for at risk foot care today. Patient states he has pain in feet due to neuropathy.  General Appearance  Alert, conversant and in no acute stress.  Vascular  Dorsalis pedis and posterior tibial  pulses are palpable  bilaterally.  Capillary return is within normal limits  bilaterally. Temperature is within normal limits  bilaterally.  Neurologic  Senn-Weinstein monofilament wire test absent  bilaterally. Muscle power within normal limits bilaterally.  Nails Thick disfigured discolored nails with subungual debris  from second  to fifth toes bilaterally. No evidence of bacterial infection or drainage bilaterally.  Orthopedic  No limitations of motion  feet .  No crepitus or effusions noted.  No bony pathology or digital deformities noted.  Skin  normotropic skin with no porokeratosis noted bilaterally.  No signs of infections or ulcers noted.     Onychomycosis  Pain in right toes  Pain in left toes  Consent was obtained for treatment procedures.   Mechanical debridement of nails 2-5  bilaterally performed with a nail nipper.  Filed with dremel without incident.    Return office visit     3 months                 Told patient to return for periodic foot care and evaluation due to potential at risk complications.   Gardiner Barefoot DPM

## 2020-04-16 ENCOUNTER — Telehealth: Payer: Self-pay | Admitting: Gastroenterology

## 2020-04-16 ENCOUNTER — Ambulatory Visit (INDEPENDENT_AMBULATORY_CARE_PROVIDER_SITE_OTHER): Payer: 59 | Admitting: Pulmonary Disease

## 2020-04-16 ENCOUNTER — Encounter: Payer: Self-pay | Admitting: Pulmonary Disease

## 2020-04-16 VITALS — BP 138/64 | HR 71 | Temp 97.8°F | Ht 74.0 in | Wt 265.4 lb

## 2020-04-16 DIAGNOSIS — R1310 Dysphagia, unspecified: Secondary | ICD-10-CM | POA: Diagnosis not present

## 2020-04-16 DIAGNOSIS — J432 Centrilobular emphysema: Secondary | ICD-10-CM

## 2020-04-16 DIAGNOSIS — R1313 Dysphagia, pharyngeal phase: Secondary | ICD-10-CM | POA: Diagnosis not present

## 2020-04-16 NOTE — Patient Instructions (Signed)
ENT consultation Take Anoro daily

## 2020-04-16 NOTE — Progress Notes (Signed)
   Subjective:    Patient ID: [name], male    DOB: [birth year], 64 y.o.   MRN: [id number]  HPI  64 yo smoker followed for COPD and obstructive sleep apnea PMH -chronic A. fib status post ablation in 2016 Has OSA -CPAP intolerant DM-2 Cirrhosis, previous EtOH use  Chief Complaint  Patient presents with  . Follow-up    shortness of breath with activity, throat pain   He called [place] 11/10 with 2 weeks of cough with green sputum and postnasal drainage with wheezing and shortness of breath >> we called in prednisone and doxycycline Today he states Breathing is stable.  He complains of difficulty swallowing -to solids as well as liquids complains of pain when swallowing-he is due to see GI Dr. Ardis Hughs.  I reviewed phone note .  EGD 10/2019 showed mild gastritis  PCP treated him for a ear infection , he reports some balance issues   Significant tests/ events reviewed LDCT 06/2019 RADS 1 .  Mild emphysema.   Spirometry 01/2010 showed ratio of 68, FEV 1 was 72%, smaller airways 55%, no significant BD response   PSG from [facility] - severe with AHI 31.h corrected by CPAP 5 cm (wt was 250 lbs)  09/2012 FEV1 67%, no BD response  Spirometry 2015 with FEV1 59%, ratio 61  Review of Systems neg for any significant sore throat, dysphagia, itching, sneezing, nasal congestion or excess/ purulent secretions, fever, chills, sweats, unintended wt loss, pleuritic or exertional cp, hempoptysis, orthopnea pnd or change in chronic leg swelling. Also denies presyncope, palpitations, heartburn, abdominal pain, nausea, vomiting, diarrhea or change in bowel or urinary habits, dysuria,hematuria, rash, arthralgias, visual complaints, headache, numbness weakness or ataxia.     Objective:   Physical Exam  Gen. older man, in no distress ENT -uvula enlarged,?  Necrotic mass adherent to uvula, could not dislodge with tongue depressor  Neck: No JVD, no thyromegaly, no carotid bruits Lungs: no use of accessory  muscles, no dullness to percussion, decreased without rales or rhonchi  Cardiovascular: Rhythm regular, heart sounds  normal, no murmurs or gallops, no peripheral edema Musculoskeletal: No deformities, no cyanosis or clubbing , no tremors       Assessment & Plan:

## 2020-04-16 NOTE — Telephone Encounter (Signed)
The pt has concerns about swallowing. He says that he can not even swallow water.  I did tell him if he cannot swallow even water he may need to be evaluated at urgent care or ED.  He has an appt on 12/15.  He states again (see previous note) that our office is useless and if we can not see him when he calls we need to hire some more help.  He says that he needs something for pain and needs a "throat specialist". I advised him that his appt is the soonest available.  He then says there is no one in the [facility] health system that can see me? He said forget it and hung up. FYI Dr Ardis Hughs

## 2020-04-16 NOTE — Telephone Encounter (Signed)
Yes, I just saw that I added another pt at 910 am. I will offer the 340 pm to the pt. He has agreed and has been added to the schedule for tomorrow.

## 2020-04-16 NOTE — Telephone Encounter (Signed)
I believe I have OV availability tomorrow (AM or PM), Can you please offer him one of those appts?

## 2020-04-17 ENCOUNTER — Encounter: Payer: Self-pay | Admitting: Gastroenterology

## 2020-04-17 ENCOUNTER — Ambulatory Visit (INDEPENDENT_AMBULATORY_CARE_PROVIDER_SITE_OTHER): Payer: 59 | Admitting: Gastroenterology

## 2020-04-17 DIAGNOSIS — B3781 Candidal esophagitis: Secondary | ICD-10-CM | POA: Diagnosis not present

## 2020-04-17 DIAGNOSIS — R1313 Dysphagia, pharyngeal phase: Secondary | ICD-10-CM

## 2020-04-17 DIAGNOSIS — R1310 Dysphagia, unspecified: Secondary | ICD-10-CM | POA: Insufficient documentation

## 2020-04-17 MED ORDER — FLUCONAZOLE 100 MG PO TABS: 100.0000 mg | ORAL_TABLET | Freq: Every day | ORAL | 2 refills | Status: AC

## 2020-04-17 NOTE — Patient Instructions (Addendum)
If you are age 62 or older, your body mass index should be between 23-30. Your Body mass index is 33.12 kg/m. If this is out of the aforementioned range listed, please consider follow up with your Primary Care Provider.  If you are age 85 or younger, your body mass index should be between 19-25. Your Body mass index is 33.12 kg/m. If this is out of the aformentioned range listed, please consider follow up with your Primary Care Provider.   We have sent the following medications to your pharmacy for you to pick up at your convenience:  START: diflucan 100mg  take one tablet daily for 10 days.  Your provider has requested that you go to the basement level for lab work in 5 days on 04-22-20. Press "B" on the elevator. The lab is located at the first door on the left as you exit the elevator.   You will follow up with our office on 05-18-2020 at 3:00pm.  Thank you for entrusting me with your care and choosing [facility].  Dr Ardis Hughs

## 2020-04-17 NOTE — Assessment & Plan Note (Signed)
Appears stable on Anoro.  He was treated recently for bronchitic COPD exacerbation He does not have any bronchospasm today

## 2020-04-17 NOTE — Assessment & Plan Note (Signed)
Will refer to ENT given pharyngeal dysphagia, negative EGD, presence of?  Necrotic mass on uvula -he will need a more detailed upper airway evaluation

## 2020-04-17 NOTE — Progress Notes (Signed)
HPI: This is a pleasant 64 year old man whom I know from cirrhosis care, last here in our office about 3-1/2 or 4 months ago.  He is here today for a new problem.  He has had intermittent dysphagia that is gradually worsening over the past several months.  In the past week or 2 he has had significant odynophagia as well.  He is currently on doxycycline for possible bronchitis.  Blood work November 2021 hemoglobin A1c 8.7; AST 38, ALT 25, alk phos 125, total bilirubin normal.  Hemoglobin 11.5, platelets 123.  INR 2.4, (unable to calculate meld score due to Coumadin use)   Review of systems: Pertinent positive and negative review of systems were noted in the above HPI section. All other review negative.   Past Medical History:  Diagnosis Date  . Arthritis    lower back  . Asthma   . Atrial flutter ([place])    s/p CTI ablation by Dr Rayann Heman  . Brain aneurysm 2009   questionable. A follow up CTA in 2009 showed no evidence of  . Chronic back pain    DDD/stenosis  . Colon polyps    9 polyps removed 10/13/11  . Complication of anesthesia September 11, 2012   slow to awaken after ablation  . COPD (chronic obstructive pulmonary disease) ([place])   . Diabetes mellitus    takes Metformin and Glimepiride daily  . Emphysema   . GERD (gastroesophageal reflux disease)    takes Omeprazole bid  . History of shingles   . HLD (hyperlipidemia)    takes Pravastatin daily  . HTN (hypertension)    takes Prinizide daily  . Obesity   . OSA (obstructive sleep apnea)    not always using cpap  . Overdose 2009   unintentional Flecanide overdose  . Peripheral neuropathy   . Short-term memory loss   . Tobacco abuse     Past Surgical History:  Procedure Laterality Date  . ANTERIOR CERVICAL DECOMP/DISCECTOMY FUSION  01/04/2012   Procedure: ANTERIOR CERVICAL DECOMPRESSION/DISCECTOMY FUSION 1 LEVEL/HARDWARE REMOVAL;  Surgeon: Ophelia Charter, MD;  Location: [facility];  Service: Neurosurgery;  Laterality:  N/A;  explore cervical fusion Cervical six - seven  with removal of codman plate anterior cervical decompression with fusion interbody prothesis plating and bonegraft  . APPENDECTOMY  2-48yr ago  . ATRIAL FIBRILLATION ABLATION N/A 09/11/2012   PT DID NOT HAVE AN ATRIAL FIBRILLATION ABLATION IN 2014!  ATRIAL FLUTTER ABLATION ONLY  . ATRIAL FLUTTER ABLATION  09/11/2012   CTI ablation by Dr ARayann Heman . BIOPSY  11/07/2019   Procedure: BIOPSY;  Surgeon: JMilus Banister MD;  Location: [facility];  Service: Endoscopy;;  . CARDIAC CATHETERIZATION  2008   no significant CAD  . CARDIOVERSION  05/05/2011   Procedure: CARDIOVERSION;  Surgeon: BLelon Perla MD;  [facility];  Service: Cardiovascular;  Laterality: N/A;  . CARDIOVERSION Bilateral 07/26/2012   Procedure: CARDIOVERSION;  Surgeon: JMinus Breeding MD;  [facility];  Service: Cardiovascular;  Laterality: Bilateral;  . CARPAL TUNNEL RELEASE  99/2000   bilateral  . COLONOSCOPY WITH PROPOFOL N/A 12/13/2012   Procedure: COLONOSCOPY WITH PROPOFOL;  Surgeon: DMilus Banister MD;  Location: [facility];  Service: Endoscopy;  Laterality: N/A;  . ELECTROPHYSIOLOGIC STUDY N/A 04/21/2015   Procedure: Atrial Fibrillation Ablation;  Surgeon: JThompson Grayer MD;  [facility];  Service: Cardiovascular;  Laterality: N/A;  . ESOPHAGOGASTRODUODENOSCOPY (EGD) WITH PROPOFOL N/A 11/07/2019   Procedure: ESOPHAGOGASTRODUODENOSCOPY (EGD) WITH PROPOFOL;  Surgeon: Milus Banister, MD;  [facility];  Service: Endoscopy;  Laterality: N/A;  . HERNIA REPAIR    . KNEE SURGERY  6-77yr ago   left  . LEFT HEART CATH AND CORONARY ANGIOGRAPHY N/A 09/19/2016   Procedure: Left Heart Cath and Coronary Angiography;  Surgeon: SBelva Crome MD;  [facility];  Service: Cardiovascular;  Laterality: N/A;  . Left inguinal hernia repair     as a child  . NASAL SEPTOPLASTY W/ TURBINOPLASTY Bilateral 02/19/2014   Procedure:  NASAL SEPTOPLASTY WITH BILATERAL TURBINATE REDUCTION;  Surgeon: KJodi Marble MD;  [facility]  Service: ENT;  Laterality: Bilateral;  . NECK SURGERY  1464yrago  . right shoulder surgery  [id number]   cyst removed  . TEE WITHOUT CARDIOVERSION  05/05/2011   Procedure: TRANSESOPHAGEAL ECHOCARDIOGRAM (TEE);  Surgeon: BriLelon PerlaD;  [name];  Service: Cardiovascular;  Laterality: N/A;  . TEE WITHOUT CARDIOVERSION N/A 04/21/2015   Procedure: TRANSESOPHAGEAL ECHOCARDIOGRAM (TEE);  Surgeon: TraSueanne MargaritaD;  [facility];  Service: Cardiovascular;  Laterality: N/A;  . THROAT SURGERY  [id number]   "thought " it was cancer but came back not  . TONSILLECTOMY      Current Outpatient Medications  Medication Sig Dispense Refill  . albuterol (PROAIR HFA) 108 (90 Base) MCG/ACT inhaler Inhale 1-2 puffs into the lungs every 6 (six) hours as needed for wheezing or shortness of breath. 18 g 5  . Ascorbic Acid (VITAMIN C PO) Take 1,000 mg by mouth daily.     . b Marland Kitchenomplex vitamins tablet Take 1 tablet by mouth daily.     . B-D UF III MINI PEN NEEDLES 31G X 5 MM MISC 1 Device by Other route 3 (three) times daily with meals. Use three times daily to inject insulin. 270 each 3  . bisoprolol (ZEBETA) 10 MG tablet Take 10 mg by mouth daily.     . Continuous Blood Gluc Sensor (FREESTYLE LIBRE 2 SENSOR) MISC 1 Device by Does not apply route every 14 (fourteen) days. 6 each 3  . diltiazem (TIAZAC) 120 MG 24 hr capsule Take 1 capsule by mouth daily.     . doMarland Kitchenycycline (VIBRA-TABS) 100 MG tablet Take 1 tablet (100 mg total) by mouth 2 (two) times daily. 14 tablet 0  . ferrous sulfate 324 MG TBEC Take 324 mg by mouth daily.     . gaMarland Kitchenapentin (NEURONTIN) 300 MG capsule Take 1 capsule (300 mg total) by mouth 2 (two) times daily. History of CHF and cirrhosis and renal insufficiency requires less dose. 60 capsule 3  . Insulin Aspart FlexPen 100 UNIT/ML SOPN INJECT 80 UNITS SUBCUTANEOUSLY 3  TIMES DAILY WITH MEALS 225 mL 3  . levocetirizine (XYZAL) 5 MG tablet Take 5 mg by mouth every evening.     . loMarland Kitchenartan (COZAAR) 50 MG tablet Take 1 tablet (50 mg total) by mouth daily. 90 tablet 3  . metFORMIN (GLUCOPHAGE-XR) 500 MG 24 hr tablet Take 1 tablet (500 mg total) by mouth at bedtime. 90 tablet 3  . metolazone (ZAROXOLYN) 2.5 MG tablet TAKE 1 TABLET ON Tuesday AND Thursday 30 MINUTES BEFORE TAKING TORSEMIDE OR AS DIRECTED 15 tablet 3  . Multiple Vitamin (MULTIVITAMIN WITH MINERALS) TABS Take 1 tablet by mouth daily.     . Multiple Vitamins-Minerals (ZINC PO) Take 50 mg by mouth daily.     . nitroGLYCERIN (NITROSTAT) 0.4 MG SL tablet Place 1 tablet (0.4 mg  total) under the tongue every 5 (five) minutes x 3 doses as needed for chest pain. 25 tablet 3  . oxyCODONE-acetaminophen (PERCOCET) 10-325 MG tablet Take 1 tablet by mouth 4 (four) times daily as needed.    . pantoprazole (PROTONIX) 20 MG tablet Take 20 mg by mouth 2 (two) times daily.     . potassium chloride SA (KLOR-CON) 20 MEQ tablet TAKE 1 TABLET BY MOUTH DAILY. TAKE AN EXTRA TABLET ON TUESDAY AND THURSDAY 40 tablet 11  . pravastatin (PRAVACHOL) 80 MG tablet TAKE 1 TABLET BY MOUTH DAILY AT BEDTIME 30 tablet 10  . sildenafil (REVATIO) 20 MG tablet Take 100 mg by mouth daily as needed (erectile dysfunction).     . torsemide (DEMADEX) 20 MG tablet TAKE 2 TABLETS BY MOUTH 2 TIMES DAILY 270 tablet 3  . triamcinolone cream (KENALOG) 0.1 % Apply 1 application topically daily as needed. 453 g 2  . umeclidinium-vilanterol (ANORO ELLIPTA) 62.5-25 MCG/INH AEPB Inhale 1 puff into the lungs daily. 60 each 5  . VITAMIN A PO Take 2,400 mcg by mouth daily.     . Vitamin D, Ergocalciferol, (DRISDOL) 1.25 MG (50000 UT) CAPS capsule Take 50,000 Units by mouth every 7 (seven) days.     [name] warfarin (COUMADIN) 5 MG tablet Take 2.5-5 mg by mouth See admin instructions. Tale 2.5 mg on Monday Take 5 mg every other day     Current Facility-Administered  Medications  Medication Dose Route Frequency Provider Last Rate Last Admin  . methylPREDNISolone acetate (DEPO-MEDROL) injection 80 mg  80 mg Other Once Magnus Sinning, MD        Allergies as of 04/17/2020 - Review Complete 04/16/2020  Allergen Reaction Noted  . Adhesive [tape]  02/14/2014  . Latex Itching 05/02/2011    Family History  Problem Relation Age of Onset  . Diabetes Mother   . Heart disease Father   . Lung cancer Father   . Diabetes Maternal Grandmother   . Colon cancer Neg Hx   . Anesthesia problems Neg Hx   . Hypotension Neg Hx   . Malignant hyperthermia Neg Hx   . Pseudochol deficiency Neg Hx   . Esophageal cancer Neg Hx   . Pancreatic cancer Neg Hx   . Stomach cancer Neg Hx     Social History   Socioeconomic History  . Marital status: Married    Spouse name: Not on file  . Number of children: 2  . Years of education: Not on file  . Highest education level: Not on file  Occupational History  . Occupation: Dealer   Tobacco Use  . Smoking status: Current Every Day Smoker    Packs/day: 1.00    Years: 49.00    Pack years: 49.00    Types: Cigarettes  . Smokeless tobacco: Never Used  . Tobacco comment: 1 pack per day 04/16/2020  Vaping Use  . Vaping Use: Never used  Substance and Sexual Activity  . Alcohol use: Not Currently    Alcohol/week: 0.0 standard drinks    Comment: 12/05/17 pt stated he has drinked 1/2 drink in 3 years  . Drug use: No  . Sexual activity: Yes  Other Topics Concern  . Not on file  Social History Narrative   Daily caffeine(Mountain Dew)       Lives in [place] with spouse.   Unemployed due to chronic back/ leg pain         Social Determinants of Health   Financial Resource Strain:   .  Difficulty of Paying Living Expenses: Not on file  Food Insecurity:   . Worried About Charity fundraiser in the Last Year: Not on file  . Ran Out of Food in the Last Year: Not on file  Transportation Needs:   . Lack of  Transportation (Medical): Not on file  . Lack of Transportation (Non-Medical): Not on file  Physical Activity:   . Days of Exercise per Week: Not on file  . Minutes of Exercise per Session: Not on file  Stress:   . Feeling of Stress : Not on file  Social Connections:   . Frequency of Communication with Friends and Family: Not on file  . Frequency of Social Gatherings with Friends and Family: Not on file  . Attends Religious Services: Not on file  . Active Member of Clubs or Organizations: Not on file  . Attends Archivist Meetings: Not on file  . Marital Status: Not on file  Intimate Partner Violence:   . Fear of Current or Ex-Partner: Not on file  . Emotionally Abused: Not on file  . Physically Abused: Not on file  . Sexually Abused: Not on file     Physical Exam: There were no vitals taken for this visit. Constitutional: generally well-appearing Psychiatric: alert and oriented x3 Eyes: extraocular movements intact Mouth: Thrush on tongue, obvious yellowish-white yeast infection in oropharynx as well, uvula seems to have yeast infection of his well Neck: supple no lymphadenopathy Cardiovascular: heart regular rate and rhythm Lungs: clear to auscultation bilaterally Abdomen: soft, nontender, nondistended, no obvious ascites, no peritoneal signs, normal bowel sounds Extremities: no lower extremity edema bilaterally Skin: Trace lower extremity edema bilaterally   Assessment and plan: 64 y.o. male with oropharyngeal, tongue yeast infection; very suspicious for esophageal candidiasis as well clinically  I recommend he stop taking the doxycycline.  This might be what predisposed him to yeast infection or perhaps he was already developing yeast infection and this caused some pill associated esophagitis which made matters worse.  I think stopping the doxycycline for now since he is almost completed the course is reasonable.  He will also start taking Diflucan 100 mg pills 1  pill once daily for 10 days.  I am calling him in a new prescription for this.  He will return to see me in 3 to 4 weeks in the office and hopefully at that time we can get back to his usual cirrhosis follow-up care.  Please see the "Patient Instructions" section for addition details about the plan.   Owens Loffler, MD [facility] 04/17/2020, 3:37 PM  Cc: Timoteo Gaul, FNP  Total time on date of encounter was 45  minutes (this included time spent preparing to see the patient reviewing records; obtaining and/or reviewing separately obtained history; performing a medically appropriate exam and/or evaluation; counseling and educating the patient and family if present; ordering medications, tests or procedures if applicable; and documenting clinical information in the health record).

## 2020-04-20 ENCOUNTER — Ambulatory Visit (INDEPENDENT_AMBULATORY_CARE_PROVIDER_SITE_OTHER): Payer: 59

## 2020-04-20 ENCOUNTER — Other Ambulatory Visit: Payer: Self-pay

## 2020-04-20 ENCOUNTER — Telehealth: Payer: Self-pay | Admitting: Gastroenterology

## 2020-04-20 DIAGNOSIS — Z7901 Long term (current) use of anticoagulants: Secondary | ICD-10-CM

## 2020-04-20 DIAGNOSIS — Z5181 Encounter for therapeutic drug level monitoring: Secondary | ICD-10-CM | POA: Diagnosis not present

## 2020-04-20 DIAGNOSIS — I480 Paroxysmal atrial fibrillation: Secondary | ICD-10-CM

## 2020-04-20 LAB — POCT INR: INR: 3.1 — AB (ref 2.0–3.0)

## 2020-04-20 NOTE — Telephone Encounter (Signed)
Left message on voicemail for patient to return call to discuss. Will continue efforts.

## 2020-04-20 NOTE — Patient Instructions (Signed)
Take 1 tablet daily except for 1/2 tablet every Monday. Repeat INR in 6 weeks.  Eat greens today or tomorrow

## 2020-04-20 NOTE — Telephone Encounter (Signed)
Patient returned your call.

## 2020-04-20 NOTE — Telephone Encounter (Signed)
Pt is requesting a call back from a nurse to discuss the [facility] he was prescribed by Dr Ardis Hughs.

## 2020-04-21 NOTE — Telephone Encounter (Signed)
Left message on voicemail for patient to return call to discuss.  Will continue efforts.

## 2020-04-22 NOTE — Telephone Encounter (Signed)
Patient states that when he picked up Diflucan last week, the pharmacy had also filled omeprazole.  Patient states he stopped taking protonix and started started omprazole.  Omeprazole is not easing his GERD symptoms.  Patient advised that medication changes were not made at last office visit on 04-17-20.  He was advised that omeprazole was not sent to pharmacy by our office.  Patient was instructed to stop omeprazole and restart protonix 20mg  one tablet twice daily as before.  Patient agreed to plan and verbalized understanding.  No further questions.

## 2020-04-27 ENCOUNTER — Telehealth: Payer: Self-pay | Admitting: Pulmonary Disease

## 2020-04-28 ENCOUNTER — Other Ambulatory Visit: Payer: Self-pay

## 2020-04-28 ENCOUNTER — Ambulatory Visit (INDEPENDENT_AMBULATORY_CARE_PROVIDER_SITE_OTHER): Payer: 59 | Admitting: Otolaryngology

## 2020-04-28 ENCOUNTER — Encounter (INDEPENDENT_AMBULATORY_CARE_PROVIDER_SITE_OTHER): Payer: Self-pay | Admitting: Otolaryngology

## 2020-04-28 VITALS — Temp 97.2°F

## 2020-04-28 DIAGNOSIS — D1039 Benign neoplasm of other parts of mouth: Secondary | ICD-10-CM

## 2020-04-28 DIAGNOSIS — R1314 Dysphagia, pharyngoesophageal phase: Secondary | ICD-10-CM | POA: Diagnosis not present

## 2020-04-28 NOTE — Progress Notes (Signed)
HPI: [name] is a 64 y.o. male who presents is [facility] for evaluation of dysphagia and uvular mass. Patient has had problems with intermittent sore throat and trouble swallowing over several weeks now. He was diagnosed with a yeast infection and was treated with antibiotics in his throat soreness is doing much better. He still has intermittent trouble swallowing at times but this is doing better also. He was noted to have a lesion on his uvula. He has not seen this. He does smoke a pack a day. He is referred here for evaluation of uvular mass as well as dysphagia.  Past Medical History:  Diagnosis Date  . Arthritis    lower back  . Asthma   . Atrial flutter ([place])    s/p CTI ablation by Dr Rayann Heman  . Brain aneurysm 2009   questionable. A follow up CTA in 2009 showed no evidence of  . Chronic back pain    DDD/stenosis  . Colon polyps    9 polyps removed 10/13/11  . Complication of anesthesia September 11, 2012   slow to awaken after ablation  . COPD (chronic obstructive pulmonary disease) ([place])   . Diabetes mellitus    takes Metformin and Glimepiride daily  . Emphysema   . GERD (gastroesophageal reflux disease)    takes Omeprazole bid  . History of shingles   . HLD (hyperlipidemia)    takes Pravastatin daily  . HTN (hypertension)    takes Prinizide daily  . Obesity   . OSA (obstructive sleep apnea)    not always using cpap  . Overdose 2009   unintentional Flecanide overdose  . Peripheral neuropathy   . Short-term memory loss   . Tobacco abuse    Past Surgical History:  Procedure Laterality Date  . ANTERIOR CERVICAL DECOMP/DISCECTOMY FUSION  01/04/2012   Procedure: ANTERIOR CERVICAL DECOMPRESSION/DISCECTOMY FUSION 1 LEVEL/HARDWARE REMOVAL;  Surgeon: Ophelia Charter, MD;  Location: [place] [facility];  Service: Neurosurgery;  Laterality: N/A;  explore cervical fusion Cervical six - seven  with removal of codman plate anterior cervical decompression with  fusion interbody prothesis plating and bonegraft  . APPENDECTOMY  2-35yrs ago  . ATRIAL FIBRILLATION ABLATION N/A 09/11/2012   PT DID NOT HAVE AN ATRIAL FIBRILLATION ABLATION IN 2014!  ATRIAL FLUTTER ABLATION ONLY  . ATRIAL FLUTTER ABLATION  09/11/2012   CTI ablation by Dr Rayann Heman  . BIOPSY  11/07/2019   Procedure: BIOPSY;  Surgeon: Milus Banister, MD;  Location: [facility];  Service: Endoscopy;;  . CARDIAC CATHETERIZATION  2008   no significant CAD  . CARDIOVERSION  05/05/2011   Procedure: CARDIOVERSION;  Surgeon: Lelon Perla, MD;  Location: [facility] ENDOSCOPY;  Service: Cardiovascular;  Laterality: N/A;  . CARDIOVERSION Bilateral 07/26/2012   Procedure: CARDIOVERSION;  Surgeon: Minus Breeding, MD;  Location: [facility] ENDOSCOPY;  Service: Cardiovascular;  Laterality: Bilateral;  . CARPAL TUNNEL RELEASE  99/2000   bilateral  . COLONOSCOPY WITH PROPOFOL N/A 12/13/2012   Procedure: COLONOSCOPY WITH PROPOFOL;  Surgeon: Milus Banister, MD;  Location: [facility];  Service: Endoscopy;  Laterality: N/A;  . ELECTROPHYSIOLOGIC STUDY N/A 04/21/2015   Procedure: Atrial Fibrillation Ablation;  Surgeon: Thompson Grayer, MD;  Location: [place] CV LAB;  Service: Cardiovascular;  Laterality: N/A;  . ESOPHAGOGASTRODUODENOSCOPY (EGD) WITH PROPOFOL N/A 11/07/2019   Procedure: ESOPHAGOGASTRODUODENOSCOPY (EGD) WITH PROPOFOL;  Surgeon: Milus Banister, MD;  Location: [facility] ENDOS[facility];  Service: Endoscopy;  Laterality: N/A;  . HERNIA REPAIR    .  KNEE SURGERY  6-58yrs ago   left  . LEFT HEART CATH AND CORONARY ANGIOGRAPHY N/A 09/19/2016   Procedure: Left Heart Cath and Coronary Angiography;  Surgeon: Belva Crome, MD;  Location: [place] CV [facility];  Service: Cardiovascular;  Laterality: N/A;  . Left inguinal hernia repair     as a child  . NASAL SEPTOPLASTY W/ TURBINOPLASTY Bilateral 02/19/2014   Procedure: NASAL SEPTOPLASTY WITH BILATERAL TURBINATE REDUCTION;  Surgeon: Jodi Marble, MD;  Location: [facility];  Service: ENT;   Laterality: Bilateral;  . NECK SURGERY  16yrs ago  . right shoulder surgery  4-53yrs ago   cyst removed  . TEE WITHOUT CARDIOVERSION  05/05/2011   Procedure: TRANSESOPHAGEAL ECHOCARDIOGRAM (TEE);  Surgeon: Lelon Perla, MD;  Location: [facility] ENDOSCOPY;  Service: Cardiovascular;  Laterality: N/A;  . TEE WITHOUT CARDIOVERSION N/A 04/21/2015   Procedure: TRANSESOPHAGEAL ECHOCARDIOGRAM (TEE);  Surgeon: Sueanne Margarita, MD;  Location: [facility] ENDOSCOPY;  Service: Cardiovascular;  Laterality: N/A;  . THROAT SURGERY  4-22yrs ago   "thought " it was cancer but came back not  . TONSILLECTOMY     Social History   Socioeconomic History  . Marital status: Married    Spouse name: Not on file  . Number of children: 2  . Years of education: Not on file  . Highest education level: Not on file  Occupational History  . Occupation: Dealer   Tobacco Use  . Smoking status: Current Every Day Smoker    Packs/day: 1.00    Years: 49.00    Pack years: 49.00    Types: Cigarettes  . Smokeless tobacco: Never Used  . Tobacco comment: 1 pack per day 04/16/2020  Vaping Use  . Vaping Use: Never used  Substance and Sexual Activity  . Alcohol use: Not Currently    Alcohol/week: 0.0 standard drinks    Comment: 12/05/17 pt stated he has drinked 1/2 drink in 3 years  . Drug use: No  . Sexual activity: Yes  Other Topics Concern  . Not on file  Social History Narrative   Daily caffeine(Mountain Dew)       Lives in [place] with spouse.   Unemployed due to chronic back/ leg pain         Social Determinants of Health   Financial Resource Strain: Not on file  Food Insecurity: Not on file  Transportation Needs: Not on file  Physical Activity: Not on file  Stress: Not on file  Social Connections: Not on file   Family History  Problem Relation Age of Onset  . Diabetes Mother   . Heart disease Father   . Lung cancer Father   . Diabetes Maternal Grandmother   . Colon cancer Neg Hx   . Anesthesia  problems Neg Hx   . Hypotension Neg Hx   . Malignant hyperthermia Neg Hx   . Pseudochol deficiency Neg Hx   . Esophageal cancer Neg Hx   . Pancreatic cancer Neg Hx   . Stomach cancer Neg Hx    Allergies  Allergen Reactions  . Adhesive [Tape]     itching  . Latex Itching    When tape is on the skin too long skin gets red & itching   Prior to Admission medications   Medication Sig Start Date End Date Taking? Authorizing Provider  albuterol (PROAIR HFA) 108 (90 Base) MCG/ACT inhaler Inhale 1-2 puffs into the lungs every 6 (six) hours as needed for wheezing or shortness of breath. 12/03/19  Parrett, Tammy S, NP  Ascorbic Acid (VITAMIN C PO) Take 500 mg by mouth daily.     [provider]  b complex vitamins tablet Take 1 tablet by mouth daily.     [provider]  B-D UF III MINI PEN NEEDLES 31G X 5 MM MISC 1 Device by Other route 3 (three) times daily with meals. Use three times daily to inject insulin. 01/21/20   Renato Shin, MD  bisoprolol (ZEBETA) 10 MG tablet Take 10 mg by mouth daily.     [provider]  Continuous Blood Gluc Sensor (FREESTYLE LIBRE 2 SENSOR) MISC 1 Device by Does not apply route every 14 (fourteen) days. 04/13/20   Renato Shin, MD  diltiazem [facility] 120 MG 24 hr capsule Take 1 capsule by mouth daily.  10/15/19   [provider]  doxycycline (VIBRA-TABS) 100 MG tablet Take 1 tablet (100 mg total) by mouth 2 (two) times daily. 03/25/20   Rigoberto Noel, MD  gabapentin (NEURONTIN) 300 MG capsule Take 1 capsule (300 mg total) by mouth 2 (two) times daily. History of CHF and cirrhosis and renal insufficiency requires less dose. 02/06/20   Jessy Oto, MD  Insulin Aspart FlexPen 100 UNIT/ML SOPN INJECT 80 UNITS SUBCUTANEOUSLY 3 TIMES DAILY WITH MEALS 03/20/20   Renato Shin, MD  levocetirizine (XYZAL) 5 MG tablet Take 5 mg by mouth every evening.     [provider]  losartan (COZAAR) 50 MG tablet Take 1 tablet (50 mg total)  by mouth daily. 04/11/18   Minus Breeding, MD  metFORMIN (GLUCOPHAGE-XR) 500 MG 24 hr tablet Take 1 tablet (500 mg total) by mouth at bedtime. 12/10/19   Renato Shin, MD  metolazone (ZAROXOLYN) 2.5 MG tablet TAKE 1 TABLET ON Tuesday AND Thursday 30 MINUTES BEFORE TAKING TORSEMIDE OR AS DIRECTED 09/17/19   Sande Rives E, PA-C  Multiple Vitamin (MULTIVITAMIN WITH MINERALS) TABS Take 1 tablet by mouth daily.     [provider]  Multiple Vitamins-Minerals (ZINC PO) Take 50 mg by mouth daily.     [provider]  nitroGLYCERIN (NITROSTAT) 0.4 MG SL tablet Place 1 tablet (0.4 mg total) under the tongue every 5 (five) minutes x 3 doses as needed for chest pain. 08/28/16   Theora Gianotti, NP  oxyCODONE-acetaminophen (PERCOCET) 10-325 MG tablet Take 1 tablet by mouth 4 (four) times daily as needed. 03/10/20   [provider]  pantoprazole (PROTONIX) 20 MG tablet Take 20 mg by mouth 2 (two) times daily.  11/06/19   [provider]  potassium chloride SA (KLOR-CON) 20 MEQ tablet TAKE 1 TABLET BY MOUTH DAILY. TAKE AN EXTRA TABLET ON TUESDAY AND THURSDAY 02/18/20   Sande Rives E, PA-C  pravastatin (PRAVACHOL) 80 MG tablet TAKE 1 TABLET BY MOUTH DAILY AT BEDTIME 04/04/18   Minus Breeding, MD  sildenafil (REVATIO) 20 MG tablet Take 100 mg by mouth daily as needed (erectile dysfunction).     [provider]  torsemide (DEMADEX) 20 MG tablet TAKE 2 TABLETS BY MOUTH 2 TIMES DAILY 12/20/19   Minus Breeding, MD  triamcinolone cream (KENALOG) 0.1 % Apply 1 application topically daily as needed. 12/19/19   Clark-Burning, Anderson Malta, PA-C  umeclidinium-vilanterol (ANORO ELLIPTA) 62.5-25 MCG/INH AEPB Inhale 1 puff into the lungs daily. 12/03/19   Parrett, Fonnie Mu, NP  VITAMIN A PO Take 2,400 mcg by mouth daily.     [provider]  Vitamin D, Ergocalciferol, (DRISDOL) 1.25 MG (50000 UT) CAPS capsule Take  50,000 Units by mouth every 7 (seven) days.      [provider]  warfarin (COUMADIN) 5 MG tablet Take 2.5-5 mg by mouth See admin instructions. Tale 2.5 mg on Monday Take 5 mg every other day    [provider]     Positive ROS: Otherwise negative  All other systems have been reviewed and were otherwise negative with the exception of those mentioned in the HPI and as above.  Physical Exam: Constitutional: Alert, well-appearing, no acute distress Ears: External ears without lesions or tenderness. Ear canals are clear bilaterally with intact, clear TMs.  Nasal: External nose without lesions. Septum midline. Patient had previous surgery with Dr. Erik Obey over 10 years ago.. Clear nasal passages bilaterally. Oral: Lips and gums without lesions. Tongue and palate mucosa without lesions. Posterior oropharynx clear. Patient is status post tonsillectomy as a child. 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 It is otherwise asymptomatic. He has a strong gag reflex. Fiberoptic laryngoscopy was performed to the left nostril. The nasopharynx was clear. The base of tongue was clear. The uvula and epiglottis were normal. Vocal cords were clear bilaterally with normal vocal cord mobility. Both piriform sinuses were clear. The fiberoptic laryngoscope was passed through the upper esophageal sphincter without difficulty and the upper cervical esophagus was clear to evaluation. Neck: No palpable adenopathy or masses Respiratory: Breathing comfortably  Skin: No facial/neck lesions or rash noted.  Laryngoscopy  Date/Time: 04/28/2020 2:54 PM Performed by: Rozetta Nunnery, MD Authorized by: Rozetta Nunnery, MD   Consent:    Consent obtained:  Verbal   Consent given by:  Patient Procedure details:    Indications: direct visualization of the upper aerodigestive tract     Medication:  Afrin   Instrument: flexible fiberoptic laryngoscope     Scope location: left nare   Sinus:    Left nasopharynx: normal   Mouth:     Oropharynx: normal     Vallecula: normal     Base of tongue: normal     Epiglottis: normal   Throat:    Pyriform sinus: normal     True vocal cords: normal   Comments:     The hypopharynx and larynx is clear to evaluation on fiberoptic laryngoscopy. Vocal cords are clear bilaterally with normal vocal mobility. He had mild edema of the arytenoid mucosa which may be indicative of reflux but there is no mucosal abnormality. In the upper cervical esophagus was clear on fiberoptic laryngoscopy.    Assessment: Dysphagia questionable etiology. Normal upper airway examination. No evidence of neoplasm or active infection. Benign papilloma of the uvula  Plan: Reviewed with the patient as well as his wife concerning normal upper airway examination with no evidence of active infection or neoplasm. I did demonstrate the papilloma to the patient's wife. Would not recommend removing this unless it becomes symptomatic or causes problems. But this has nothing to do with his complaints of dysphagia or sore throat.   Radene Journey, MD   CC:

## 2020-04-29 ENCOUNTER — Ambulatory Visit: Payer: 59 | Admitting: Gastroenterology

## 2020-04-29 NOTE — Telephone Encounter (Signed)
Called and spoke with Patient.  Patient requested a regular flu vaccine.  Patient scheduled 05/01/20 at 1530.

## 2020-05-01 ENCOUNTER — Other Ambulatory Visit: Payer: Self-pay

## 2020-05-01 ENCOUNTER — Ambulatory Visit (INDEPENDENT_AMBULATORY_CARE_PROVIDER_SITE_OTHER): Payer: 59

## 2020-05-01 DIAGNOSIS — Z23 Encounter for immunization: Secondary | ICD-10-CM

## 2020-05-04 ENCOUNTER — Emergency Department (HOSPITAL_COMMUNITY): Payer: 59

## 2020-05-04 ENCOUNTER — Telehealth: Payer: Self-pay | Admitting: Endocrinology

## 2020-05-04 ENCOUNTER — Emergency Department (HOSPITAL_COMMUNITY)
Admission: EM | Admit: 2020-05-04 | Discharge: 2020-05-05 | Disposition: A | Discharge status: Home / Self Care | Payer: 59 | Attending: Emergency Medicine | Admitting: Emergency Medicine

## 2020-05-04 DIAGNOSIS — Z5321 Procedure and treatment not carried out due to patient leaving prior to being seen by health care provider: Secondary | ICD-10-CM | POA: Insufficient documentation

## 2020-05-04 DIAGNOSIS — R0789 Other chest pain: Secondary | ICD-10-CM | POA: Insufficient documentation

## 2020-05-04 DIAGNOSIS — R0602 Shortness of breath: Secondary | ICD-10-CM | POA: Insufficient documentation

## 2020-05-04 NOTE — ED Triage Notes (Signed)
Patient arrived with EMS from home reports central chest pain this evening with SOB , no emesis or diaphoresis , he received ASA 324 mg by EMS , no cough or fever .

## 2020-05-04 NOTE — Telephone Encounter (Signed)
Patient requests to be called at ph# [phone] re: Patient's Blood sugars running high (average-250). Patient states he has allergic reactions to two the medications prescribed by Dr. Loanne Drilling (one is [place]).

## 2020-05-05 ENCOUNTER — Ambulatory Visit: Payer: 59 | Admitting: Physical Medicine and Rehabilitation

## 2020-05-05 LAB — BASIC METABOLIC PANEL
Anion gap: 14 (ref 5–15)
BUN: 28 mg/dL — ABNORMAL HIGH (ref 8–23)
CO2: 29 mmol/L (ref 22–32)
Calcium: 9 mg/dL (ref 8.9–10.3)
Chloride: 101 mmol/L (ref 98–111)
Creatinine, Ser: 1.5 mg/dL — ABNORMAL HIGH (ref 0.61–1.24)
GFR, Estimated: 52 mL/min — ABNORMAL LOW (ref >60)
Glucose, Bld: 140 mg/dL — ABNORMAL HIGH (ref 70–99)
Potassium: 3.1 mmol/L — ABNORMAL LOW (ref 3.5–5.1)
Sodium: 144 mmol/L (ref 135–145)

## 2020-05-05 LAB — CBC
HCT: 33.3 % — ABNORMAL LOW (ref 39.0–52.0)
Hemoglobin: 10.8 g/dL — ABNORMAL LOW (ref 13.0–17.0)
MCH: 30.7 pg (ref 26.0–34.0)
MCHC: 32.4 g/dL (ref 30.0–36.0)
MCV: 94.6 fL (ref 80.0–100.0)
Platelets: 142 10*3/uL — ABNORMAL LOW (ref 150–400)
RBC: 3.52 MIL/uL — ABNORMAL LOW (ref 4.22–5.81)
RDW: 17.4 % — ABNORMAL HIGH (ref 11.5–15.5)
WBC: 7.3 10*3/uL (ref 4.0–10.5)
nRBC: 0 % (ref 0.0–0.2)

## 2020-05-05 LAB — TROPONIN I (HIGH SENSITIVITY): Troponin I (High Sensitivity): 13 ng/L (ref <18)

## 2020-05-05 LAB — PROTIME-INR
INR: 4.2 (ref 0.8–1.2)
Prothrombin Time: 39.3 seconds — ABNORMAL HIGH (ref 11.4–15.2)

## 2020-05-05 NOTE — ED Notes (Signed)
Mortimer Fries, Triage RN notified of pt wanting to leave. Mortimer Fries spoke with pt concerning the risks of leaving given his abnormal labs. Pt was adamant about leaving.

## 2020-05-05 NOTE — ED Notes (Signed)
Pt has now decided to leave. Staff again expressed the importance of staying. However, pt has decided to leave. Pt visualized getting in car.

## 2020-05-05 NOTE — Telephone Encounter (Signed)
Patient called this morning. They had called EMS last night due to allergic reactions to the insulin. Patient stated that he had discuss this with Dr Loanne Drilling in prior visits.   Blood Sugar reading from yesterday 05/04/2020  12 am 170 1 am 270 8 am 300 1 pm 180 injected insulin  4 pm 300 7 pm 160 11pm 60

## 2020-05-06 NOTE — Telephone Encounter (Signed)
I need to know what the reaction was.  Also, we have you on novolog only--not levemir.  Please verify

## 2020-05-06 NOTE — Telephone Encounter (Signed)
How about a once a week injection, such as Trulicity?  OK with you?

## 2020-05-06 NOTE — Telephone Encounter (Signed)
Spoken to patient. I have misunderstood him. He stated that he is concern that blood sugar continue to be high with just the meal time insulin (novolog). He takes 90-100 units 3 times a day and sometime even more.   Patient stated that he had allergic reaction to most of the long acting insulin like SOB which was months ago.  He wants to know if there is something else he can take to help lower his blood sugar.  Patient stated that he had an injection in his back which brought his blood sugar to 500s. He will be getting another one in a couple of months which help be able to move around.  Please advise on patient wanting to add something to help bring down his readings.

## 2020-05-07 ENCOUNTER — Ambulatory Visit (INDEPENDENT_AMBULATORY_CARE_PROVIDER_SITE_OTHER): Payer: 59 | Admitting: Pharmacist

## 2020-05-07 ENCOUNTER — Ambulatory Visit: Payer: 59 | Admitting: Specialist

## 2020-05-07 ENCOUNTER — Other Ambulatory Visit: Payer: Self-pay

## 2020-05-07 DIAGNOSIS — I480 Paroxysmal atrial fibrillation: Secondary | ICD-10-CM

## 2020-05-07 DIAGNOSIS — Z7901 Long term (current) use of anticoagulants: Secondary | ICD-10-CM

## 2020-05-07 LAB — BASIC METABOLIC PANEL
BUN/Creatinine Ratio: 25 — ABNORMAL HIGH (ref 10–24)
BUN: 32 mg/dL — ABNORMAL HIGH (ref 8–27)
CO2: 26 mmol/L (ref 20–29)
Calcium: 9 mg/dL (ref 8.6–10.2)
Chloride: 99 mmol/L (ref 96–106)
Creatinine, Ser: 1.28 mg/dL — ABNORMAL HIGH (ref 0.76–1.27)
GFR calc Af Amer: 69 mL/min/{1.73_m2} (ref >59)
GFR calc non Af Amer: 60 mL/min/{1.73_m2} (ref >59)
Glucose: 260 mg/dL — ABNORMAL HIGH (ref 65–99)
Potassium: 3.5 mmol/L (ref 3.5–5.2)
Sodium: 142 mmol/L (ref 134–144)

## 2020-05-07 LAB — CBC
Hematocrit: 32.1 % — ABNORMAL LOW (ref 37.5–51.0)
Hemoglobin: 10.8 g/dL — ABNORMAL LOW (ref 13.0–17.7)
MCH: 31.2 pg (ref 26.6–33.0)
MCHC: 33.6 g/dL (ref 31.5–35.7)
MCV: 93 fL (ref 79–97)
Platelets: 141 10*3/uL — ABNORMAL LOW (ref 150–450)
RBC: 3.46 x10E6/uL — ABNORMAL LOW (ref 4.14–5.80)
RDW: 16.7 % — ABNORMAL HIGH (ref 11.6–15.4)
WBC: 6.6 10*3/uL (ref 3.4–10.8)

## 2020-05-07 LAB — POCT INR: INR: 2.2 (ref 2.0–3.0)

## 2020-05-07 MED ORDER — TRULICITY 0.75 MG/0.5ML ~~LOC~~ SOAJ: 0.7500 mg | SUBCUTANEOUS | 3 refills | Status: DC

## 2020-05-07 NOTE — Telephone Encounter (Signed)
Spoken to patient and notified Dr Cordelia Pen comments. Patient is agreeable to trying Trulicity

## 2020-05-07 NOTE — Telephone Encounter (Signed)
OK, I have sent a prescription to your pharmacy.  I'll see you next time.   

## 2020-05-07 NOTE — Addendum Note (Signed)
Addended by: Renato Shin on: 05/07/2020 02:27 PM   Modules accepted: Orders

## 2020-05-07 NOTE — Telephone Encounter (Signed)
Message left for patient to return my call.  

## 2020-05-13 ENCOUNTER — Telehealth: Payer: Self-pay | Admitting: Endocrinology

## 2020-05-13 ENCOUNTER — Other Ambulatory Visit: Payer: Self-pay

## 2020-05-13 ENCOUNTER — Ambulatory Visit (INDEPENDENT_AMBULATORY_CARE_PROVIDER_SITE_OTHER): Payer: 59

## 2020-05-13 DIAGNOSIS — I480 Paroxysmal atrial fibrillation: Secondary | ICD-10-CM | POA: Diagnosis not present

## 2020-05-13 DIAGNOSIS — Z5181 Encounter for therapeutic drug level monitoring: Secondary | ICD-10-CM

## 2020-05-13 DIAGNOSIS — Z7901 Long term (current) use of anticoagulants: Secondary | ICD-10-CM

## 2020-05-13 LAB — POCT INR: INR: 2.6 (ref 2.0–3.0)

## 2020-05-13 NOTE — Telephone Encounter (Signed)
**  POSSIBLE ALLERGIC/ADVERSE REACTION** Patient called to advise that Dr Everardo All started him on Trulicity on 05/07/20 since then he has had SOB, N/V, and abdominal pain/upset stomach. Call back number is [phone] - can leave a message if no answer

## 2020-05-13 NOTE — Telephone Encounter (Signed)
Please advise 

## 2020-05-13 NOTE — Patient Instructions (Signed)
Take 1 tablet daily except for 1/2 tablet every Monday, Wednesday and Friday. Repeat INR  in 3 weeks.

## 2020-05-13 NOTE — Telephone Encounter (Signed)
Please d/c Trulicity.  I hope you feel better soon.

## 2020-05-14 ENCOUNTER — Ambulatory Visit: Payer: 59 | Admitting: Specialist

## 2020-05-18 ENCOUNTER — Ambulatory Visit: Payer: 59 | Admitting: Gastroenterology

## 2020-05-18 NOTE — Telephone Encounter (Signed)
Called pt --stated pt had a bad virus for 3 days, thinking something to do with the Rx. Trulicity but still continue taking the medication and doing much better .

## 2020-05-18 NOTE — Procedures (Signed)
Lumbosacral Transforaminal Epidural Steroid Injection - Sub-Pedicular Approach with Fluoroscopic Guidance  Patient: [name]      Date of Birth: [birth year] MRN: [id number] PCP: Elisabeth Most, FNP      Visit Date: 03/26/2020   Universal Protocol:    Date/Time: 03/26/2020  Consent Given By: the patient  Position: PRONE  Additional Comments: Vital signs were monitored before and after the procedure. Patient was prepped and draped in the usual sterile fashion. The correct patient, procedure, and site was verified.   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 DISCONTD: methylPREDNISolone acetate (DEPO-MEDROL) injection 80 mg    Laterality: Bilateral  Location/Site:  L4-L5  Needle:5.0 in., 22 ga.  Short bevel or Quincke spinal needle  Needle Placement: Transforaminal  Findings:    -Comments: Excellent flow of contrast along the nerve, nerve root and into the epidural space.  Procedure Details: After squaring off the end-plates to get a true AP view, the C-arm was positioned so that an oblique view of the foramen as noted above was visualized. The target area is just inferior to the "nose of the scotty dog" or sub pedicular. The soft tissues overlying this structure were infiltrated with 2-3 ml. of 1% Lidocaine without Epinephrine.  The spinal needle was inserted toward the target using a "trajectory" view along the fluoroscope beam.  Under AP and lateral visualization, the needle was advanced so it did not puncture dura and was located close the 6 O'Clock position of the pedical in AP tracterory. Biplanar projections were used to confirm position. Aspiration was confirmed to be negative for CSF and/or blood. A 1-2 ml. volume of Isovue-250 was injected and flow of contrast was noted at each level. Radiographs were obtained  for documentation purposes.   After attaining the desired flow of contrast documented above, a 0.5 to 1.0 ml test dose of 0.25% Marcaine was injected into each respective transforaminal space.  The patient was observed for 90 seconds post injection.  After no sensory deficits were reported, and normal lower extremity motor function was noted,   the above injectate was administered so that equal amounts of the injectate were placed at each foramen (level) into the transforaminal epidural space.   Additional Comments:  The patient tolerated the procedure well Dressing: 2 x 2 sterile gauze and Band-Aid    Post-procedure details: Patient was observed during the procedure. Post-procedure instructions were reviewed.  Patient left the clinic in stable condition.

## 2020-05-18 NOTE — Progress Notes (Signed)
[name] - 66 y.o. male MRN [id number]  Date of birth: 03/17/1956  Office Visit Note: Visit Date: 03/26/2020 PCP: Elisabeth Most, FNP Referred by: Elisabeth Most, FNP  Subjective: Chief Complaint  Patient presents with  . Lower Back - Pain  . Left Leg - Pain   HPI:  [name] is a 65 y.o. male who comes in today  for planned Bilateral L4-L5 Lumbar epidural steroid injection with fluoroscopic guidance.  The patient has failed conservative care including home exercise, medications, time and activity modification.  This injection will be diagnostic and hopefully therapeutic.  Please see requesting physician notes for further details and justification.  MRI reviewed with images and spine model.  MRI reviewed in the note below.    ROS Otherwise per HPI.  Assessment & Plan: Visit Diagnoses:    ICD-10-CM   1. Spinal stenosis of lumbar region with neurogenic claudication  M48.062 XR C-ARM NO REPORT    Epidural Steroid injection    DISCONTINUED: methylPREDNISolone acetate (DEPO-MEDROL) injection 80 mg  2. Radiculopathy due to lumbar intervertebral disc disorder  M51.16 XR C-ARM NO REPORT    Epidural Steroid injection    DISCONTINUED: methylPREDNISolone acetate (DEPO-MEDROL) injection 80 mg    Plan: No additional findings.   Meds & Orders:  Meds ordered this encounter  Medications  . DISCONTD: methylPREDNISolone acetate (DEPO-MEDROL) injection 80 mg    Orders Placed This Encounter  Procedures  . XR C-ARM NO REPORT  . Epidural Steroid injection    Follow-up: Return if symptoms worsen or fail to improve.   Procedures: No procedures performed  Lumbosacral Transforaminal Epidural Steroid Injection - Sub-Pedicular Approach with Fluoroscopic Guidance  Patient: [name]      Date of Birth: January 26, 1956 MRN: [id number] PCP: Elisabeth Most, FNP      Visit Date: 03/26/2020   Universal Protocol:    Date/Time: 03/26/2020  Consent Given By: the  patient  Position: PRONE  Additional Comments: Vital signs were monitored before and after the procedure. Patient was prepped and draped in the usual sterile fashion. The correct patient, procedure, and site was verified.   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 DISCONTD: methylPREDNISolone acetate (DEPO-MEDROL) injection 80 mg    Laterality: Bilateral  Location/Site:  L4-L5  Needle:5.0 in., 22 ga.  Short bevel or Quincke spinal needle  Needle Placement: Transforaminal  Findings:    -Comments: Excellent flow of contrast along the nerve, nerve root and into the epidural space.  Procedure Details: After squaring off the end-plates to get a true AP view, the C-arm was positioned so that an oblique view of the foramen as noted above was visualized. The target area is just inferior to the "nose of the scotty dog" or sub pedicular. The soft tissues overlying this structure were infiltrated with 2-3 ml. of 1% Lidocaine without Epinephrine.  The spinal needle was inserted toward the target using a "trajectory" view along the fluoroscope beam.  Under AP and lateral visualization, the needle was advanced so it did not puncture dura and was located close the 6 O'Clock position of the pedical in AP tracterory. Biplanar projections were used to confirm position. Aspiration was confirmed to be negative for CSF and/or blood. A 1-2 ml. volume of Isovue-250 was injected and flow of contrast was noted at each level. Radiographs were obtained for  documentation purposes.   After attaining the desired flow of contrast documented above, a 0.5 to 1.0 ml test dose of 0.25% Marcaine was injected into each respective transforaminal space.  The patient was observed for 90 seconds post injection.  After no sensory deficits were reported, and normal lower  extremity motor function was noted,   the above injectate was administered so that equal amounts of the injectate were placed at each foramen (level) into the transforaminal epidural space.   Additional Comments:  The patient tolerated the procedure well Dressing: 2 x 2 sterile gauze and Band-Aid    Post-procedure details: Patient was observed during the procedure. Post-procedure instructions were reviewed.  Patient left the clinic in stable condition.      Clinical History: MRI LUMBAR SPINE WITHOUT CONTRAST  TECHNIQUE: Multiplanar, multisequence MR imaging of the lumbar spine was performed. No intravenous contrast was administered.  COMPARISON:  02/06/2020 lumbar spine radiographs. 05/06/2017 MRI lumbar spine and prior.  FINDINGS: Segmentation:  Standard.  Alignment:  Straightening of lordosis.  Grade 1 L3-4 retrolisthesis.  Vertebrae: Multilevel Modic type 2 endplate degenerative changes. Multilevel Schmorl's node formation. Scattered hemangiomata versus focal fat.  Conus medullaris and cauda equina: Conus extends to the L1 level. Conus and cauda equina appear normal.  Disc levels: Multilevel desiccation disc space loss most prominent at the L3-4 and L5-S1 levels.  L1-2: Disc bulge with small central protrusion, ligamentum flavum and bilateral facet hypertrophy. Mild spinal canal and bilateral neural foraminal narrowing.  L2-3: Disc bulge, ligamentum flavum and bilateral facet hypertrophy. Mild spinal canal, moderate right and mild left neural foraminal narrowing.  L3-4: Disc bulge abutting the ventral thecal sac with prominent subarticular components effacing the lateral recess. There is abutment of the exiting L3 and descending L4 nerve roots. Bilateral facet hypertrophy. Moderate spinal canal and bilateral neural foraminal narrowing, unchanged.  L4-5: Disc bulge and bilateral facet hypertrophy. Small left foraminal protrusion grazing the  exiting L4 nerve root. Patent spinal canal. Moderate bilateral neural foraminal narrowing.  L5-S1: Bilateral pars defects. Disc bulge with superimposed central protrusion/annular fissuring. Bilateral facet hypertrophy. Patent spinal canal. Mild bilateral neural foraminal narrowing.  Paraspinal and other soft tissues: Bilateral renal cysts.  IMPRESSION: Multilevel spondylosis, grossly unchanged.  Moderate L3-4 and mild L1-3 spinal canal narrowing.  Moderate right L2-3 and bilateral L3-5 neural foraminal narrowing.  Mild bilateral L1-2, L5-S1 neural foraminal narrowing.   Electronically Signed   By: Primitivo Gauze M.D.   On: 03/01/2020 10:46     Objective:  VS:  HT:    WT:   BMI:     BP:(!) 113/52  HR:65bpm  TEMP: ( )  RESP:  Physical Exam Constitutional:      General: He is not in acute distress.    Appearance: Normal appearance. He is not ill-appearing.  HENT:     Head: Normocephalic and atraumatic.     Right Ear: External ear normal.     Left Ear: External ear normal.  Eyes:     Extraocular Movements: Extraocular movements intact.  Cardiovascular:     Rate and Rhythm: Normal rate.     Pulses: Normal pulses.  Abdominal:     General: There is no distension.     Palpations: Abdomen is soft.  Musculoskeletal:        General: No tenderness or signs of injury.     Right lower leg: No edema.     Left lower leg: No edema.     Comments: Patient has good distal  strength without clonus.  Skin:    Findings: No erythema or rash.  Neurological:     General: No focal deficit present.     Mental Status: He is alert and oriented to person, place, and time.     Sensory: No sensory deficit.     Motor: No weakness or abnormal muscle tone.     Coordination: Coordination normal.  Psychiatric:        Mood and Affect: Mood normal.        Behavior: Behavior normal.      Imaging: No results found.

## 2020-05-19 ENCOUNTER — Other Ambulatory Visit: Payer: Self-pay | Admitting: Student

## 2020-05-25 ENCOUNTER — Other Ambulatory Visit: Payer: Self-pay

## 2020-05-25 ENCOUNTER — Ambulatory Visit (INDEPENDENT_AMBULATORY_CARE_PROVIDER_SITE_OTHER): Payer: 59 | Admitting: Physical Medicine and Rehabilitation

## 2020-05-25 ENCOUNTER — Encounter: Payer: Self-pay | Admitting: Physical Medicine and Rehabilitation

## 2020-05-25 ENCOUNTER — Ambulatory Visit: Payer: Self-pay

## 2020-05-25 VITALS — BP 127/68 | HR 74

## 2020-05-25 DIAGNOSIS — M5116 Intervertebral disc disorders with radiculopathy, lumbar region: Secondary | ICD-10-CM | POA: Diagnosis not present

## 2020-05-25 DIAGNOSIS — M48062 Spinal stenosis, lumbar region with neurogenic claudication: Secondary | ICD-10-CM

## 2020-05-25 MED ORDER — DEXAMETHASONE SODIUM PHOSPHATE 10 MG/ML IJ SOLN
15.0000 mg | Freq: Once | INTRAMUSCULAR | Status: AC
  Administered 2020-05-25: 15 mg

## 2020-05-25 NOTE — Procedures (Signed)
Lumbosacral Transforaminal Epidural Steroid Injection - Sub-Pedicular Approach with Fluoroscopic Guidance  Patient: [name]      Date of Birth: 05-04-1956 MRN: [id number] PCP: Timoteo Gaul, FNP      Visit Date: 05/25/2020   Universal Protocol:    Date/Time: 05/25/2020  Consent Given By: the patient  Position: PRONE  Additional Comments: Vital signs were monitored before and after the procedure. Patient was prepped and draped in the usual sterile fashion. The correct patient, procedure, and site was verified.   Injection Procedure Details:   Procedure diagnoses: Radiculopathy due to lumbar intervertebral disc disorder [M51.16]    Meds Administered:  Meds ordered this encounter  Medications  . dexamethasone (DECADRON) injection 15 mg    Laterality: Bilateral  Location/Site:  L3-L4  Needle:5.0 in., 22 ga.  Short bevel or Quincke spinal needle  Needle Placement: Transforaminal  Findings:    -Comments: Excellent flow of contrast along the nerve, nerve root and into the epidural space.  Procedure Details: After squaring off the end-plates to get a true AP view, the C-arm was positioned so that an oblique view of the foramen as noted above was visualized. The target area is just inferior to the "nose of the scotty dog" or sub pedicular. The soft tissues overlying this structure were infiltrated with 2-3 ml. of 1% Lidocaine without Epinephrine.  The spinal needle was inserted toward the target using a "trajectory" view along the fluoroscope beam.  Under AP and lateral visualization, the needle was advanced so it did not puncture dura and was located close the 6 O'Clock position of the pedical in AP tracterory. Biplanar projections were used to confirm position. Aspiration was confirmed to be negative for CSF and/or blood. A 1-2 ml. volume of Isovue-250 was injected and flow of contrast was noted at each level. Radiographs were obtained for documentation purposes.    After attaining the desired flow of contrast documented above, a 0.5 to 1.0 ml test dose of 0.25% Marcaine was injected into each respective transforaminal space.  The patient was observed for 90 seconds post injection.  After no sensory deficits were reported, and normal lower extremity motor function was noted,   the above injectate was administered so that equal amounts of the injectate were placed at each foramen (level) into the transforaminal epidural space.   Additional Comments:  The patient tolerated the procedure well Dressing: 2 x 2 sterile gauze and Band-Aid    Post-procedure details: Patient was observed during the procedure. Post-procedure instructions were reviewed.  Patient left the clinic in stable condition.

## 2020-05-25 NOTE — Patient Instructions (Signed)

## 2020-05-25 NOTE — Progress Notes (Signed)
Pt state lower back that travels down both leg. Pt state when his back hurt his leg doesn't hurt. When his legs hurt his back doesn't hurt. Pt state walking and standing makes the pain worse. Pt state he lay down or sits and take pain meds to ease the pain. Pt has hx of inj on 03/26/20 pt state right leg lasted a few weeks and the left leg lasted four days.  Numeric Pain Rating Scale and Functional Assessment Average Pain 6   In the last MONTH (on 0-10 scale) has pain interfered with the following?  1. General activity like being  able to carry out your everyday physical activities such as walking, climbing stairs, carrying groceries, or moving a chair?  Rating(10)   +Driver, +BT, -Dye Allergies.

## 2020-05-25 NOTE — Progress Notes (Signed)
[name] - 65 y.o. male MRN [id number]  Date of birth: Apr 01, 1956  Office Visit Note: Visit Date: 05/25/2020 PCP: Timoteo Gaul, FNP Referred by: Timoteo Gaul, FNP  Subjective: Chief Complaint  Patient presents with  . Lower Back - Pain  . Left Leg - Pain  . Right Leg - Pain   HPI:  [name] is a 65 y.o. male who comes in today at the request of Dr. Basil Dess for planned Bilateral L3-L4 Lumbar epidural steroid injection with fluoroscopic guidance.  The patient has failed conservative care including home exercise, medications, time and activity modification.  This injection will be diagnostic and hopefully therapeutic.  Please see requesting physician notes for further details and justification.  MRI reviewed with images and spine model.  MRI reviewed in the note below.    ROS Otherwise per HPI.  Assessment & Plan: Visit Diagnoses:    ICD-10-CM   1. Radiculopathy due to lumbar intervertebral disc disorder  M51.16 XR C-ARM NO REPORT    Epidural Steroid injection    dexamethasone (DECADRON) injection 15 mg  2. Spinal stenosis of lumbar region with neurogenic claudication  M48.062 XR C-ARM NO REPORT    Epidural Steroid injection    dexamethasone (DECADRON) injection 15 mg    Plan: No additional findings.   Meds & Orders:  Meds ordered this encounter  Medications  . dexamethasone (DECADRON) injection 15 mg    Orders Placed This Encounter  Procedures  . XR C-ARM NO REPORT  . Epidural Steroid injection    Follow-up: Return for visit to requesting physician as needed, Basil Dess, MD.   Procedures: No procedures performed  Lumbosacral Transforaminal Epidural Steroid Injection - Sub-Pedicular Approach with Fluoroscopic Guidance  Patient: [name]      Date of Birth: Nov 01, 1955 MRN: [id number] PCP: Timoteo Gaul, FNP      Visit Date: 05/25/2020   Universal Protocol:    Date/Time: 05/25/2020  Consent Given By: the patient  Position:  PRONE  Additional Comments: Vital signs were monitored before and after the procedure. Patient was prepped and draped in the usual sterile fashion. The correct patient, procedure, and site was verified.   Injection Procedure Details:   Procedure diagnoses: Radiculopathy due to lumbar intervertebral disc disorder [M51.16]    Meds Administered:  Meds ordered this encounter  Medications  . dexamethasone (DECADRON) injection 15 mg    Laterality: Bilateral  Location/Site:  L3-L4  Needle:5.0 in., 22 ga.  Short bevel or Quincke spinal needle  Needle Placement: Transforaminal  Findings:    -Comments: Excellent flow of contrast along the nerve, nerve root and into the epidural space.  Procedure Details: After squaring off the end-plates to get a true AP view, the C-arm was positioned so that an oblique view of the foramen as noted above was visualized. The target area is just inferior to the "nose of the scotty dog" or sub pedicular. The soft tissues overlying this structure were infiltrated with 2-3 ml. of 1% Lidocaine without Epinephrine.  The spinal needle was inserted toward the target using a "trajectory" view along the fluoroscope beam.  Under AP and lateral visualization, the needle was advanced so it did not puncture dura and was located close the 6 O'Clock position of the pedical in AP tracterory. Biplanar projections were used to confirm position. Aspiration was confirmed to be negative for CSF and/or blood. A 1-2 ml. volume of Isovue-250 was injected and flow of contrast was noted at  each level. Radiographs were obtained for documentation purposes.   After attaining the desired flow of contrast documented above, a 0.5 to 1.0 ml test dose of 0.25% Marcaine was injected into each respective transforaminal space.  The patient was observed for 90 seconds post injection.  After no sensory deficits were reported, and normal lower extremity motor function was noted,   the above  injectate was administered so that equal amounts of the injectate were placed at each foramen (level) into the transforaminal epidural space.   Additional Comments:  The patient tolerated the procedure well Dressing: 2 x 2 sterile gauze and Band-Aid    Post-procedure details: Patient was observed during the procedure. Post-procedure instructions were reviewed.  Patient left the clinic in stable condition.      Clinical History: MRI LUMBAR SPINE WITHOUT CONTRAST  TECHNIQUE: Multiplanar, multisequence MR imaging of the lumbar spine was performed. No intravenous contrast was administered.  COMPARISON:  02/06/2020 lumbar spine radiographs. 05/06/2017 MRI lumbar spine and prior.  FINDINGS: Segmentation:  Standard.  Alignment:  Straightening of lordosis.  Grade 1 L3-4 retrolisthesis.  Vertebrae: Multilevel Modic type 2 endplate degenerative changes. Multilevel Schmorl's node formation. Scattered hemangiomata versus focal fat.  Conus medullaris and cauda equina: Conus extends to the L1 level. Conus and cauda equina appear normal.  Disc levels: Multilevel desiccation disc space loss most prominent at the L3-4 and L5-S1 levels.  L1-2: Disc bulge with small central protrusion, ligamentum flavum and bilateral facet hypertrophy. Mild spinal canal and bilateral neural foraminal narrowing.  L2-3: Disc bulge, ligamentum flavum and bilateral facet hypertrophy. Mild spinal canal, moderate right and mild left neural foraminal narrowing.  L3-4: Disc bulge abutting the ventral thecal sac with prominent subarticular components effacing the lateral recess. There is abutment of the exiting L3 and descending L4 nerve roots. Bilateral facet hypertrophy. Moderate spinal canal and bilateral neural foraminal narrowing, unchanged.  L4-5: Disc bulge and bilateral facet hypertrophy. Small left foraminal protrusion grazing the exiting L4 nerve root. Patent spinal canal. Moderate  bilateral neural foraminal narrowing.  L5-S1: Bilateral pars defects. Disc bulge with superimposed central protrusion/annular fissuring. Bilateral facet hypertrophy. Patent spinal canal. Mild bilateral neural foraminal narrowing.  Paraspinal and other soft tissues: Bilateral renal cysts.  IMPRESSION: Multilevel spondylosis, grossly unchanged.  Moderate L3-4 and mild L1-3 spinal canal narrowing.  Moderate right L2-3 and bilateral L3-5 neural foraminal narrowing.  Mild bilateral L1-2, L5-S1 neural foraminal narrowing.   Electronically Signed   By: Primitivo Gauze M.D.   On: 03/01/2020 10:46     Objective:  VS:  HT:    WT:   BMI:     BP:127/68  HR:74bpm  TEMP: ( )  RESP:  Physical Exam Vitals and nursing note reviewed.  Constitutional:      General: He is not in acute distress.    Appearance: Normal appearance. He is obese. He is not ill-appearing.  HENT:     Head: Normocephalic and atraumatic.     Right Ear: External ear normal.     Left Ear: External ear normal.     Nose: No congestion.  Eyes:     Extraocular Movements: Extraocular movements intact.  Cardiovascular:     Rate and Rhythm: Normal rate.     Pulses: Normal pulses.  Pulmonary:     Effort: Pulmonary effort is normal. No respiratory distress.  Abdominal:     General: There is distension.     Palpations: There is no mass.     Tenderness: There is abdominal  tenderness.  Musculoskeletal:        General: No tenderness or signs of injury.     Cervical back: Neck supple.     Right lower leg: No edema.     Left lower leg: No edema.     Comments: Patient has good distal strength without clonus.  Skin:    Findings: No erythema or rash.  Neurological:     General: No focal deficit present.     Mental Status: He is alert and oriented to person, place, and time.     Sensory: No sensory deficit.     Motor: No weakness or abnormal muscle tone.     Coordination: Coordination normal.  Psychiatric:         Mood and Affect: Mood normal.        Behavior: Behavior normal.      Imaging: No results found.

## 2020-05-27 ENCOUNTER — Encounter: Payer: Self-pay | Admitting: Dermatology

## 2020-05-27 ENCOUNTER — Other Ambulatory Visit: Payer: Self-pay

## 2020-05-27 ENCOUNTER — Ambulatory Visit (INDEPENDENT_AMBULATORY_CARE_PROVIDER_SITE_OTHER): Payer: 59 | Admitting: Dermatology

## 2020-05-27 DIAGNOSIS — L821 Other seborrheic keratosis: Secondary | ICD-10-CM

## 2020-05-27 DIAGNOSIS — D485 Neoplasm of uncertain behavior of skin: Secondary | ICD-10-CM | POA: Diagnosis not present

## 2020-05-27 DIAGNOSIS — L918 Other hypertrophic disorders of the skin: Secondary | ICD-10-CM

## 2020-05-27 DIAGNOSIS — Z1283 Encounter for screening for malignant neoplasm of skin: Secondary | ICD-10-CM

## 2020-05-27 NOTE — Progress Notes (Signed)
   Follow-Up Visit   Subjective  [name] is a 65 y.o. male who presents for the following: Annual Exam (Scalp- few scaly spots PCP concerned about).  Crust Location: Left scalp Duration:  Quality:  Associated Signs/Symptoms: Modifying Factors:  Severity:  Timing: Context: Would like other spots on skin checked  Objective  Well appearing patient in no apparent distress; mood and affect are within normal limits. Objective  Left Breast: 3 mm flesh-colored cutaneous horn     Objective  Chest - Medial [facility]): Waist up skin examination: No atypical moles or melanoma.  Objective  Left Malar Cheek, Neck - Anterior: 2 mm flesh-colored pedunculated papule  Objective  Head - Anterior (Face): 7 mm tan textured flattopped papule   All skin waist up examined.   Assessment & Plan    Neoplasm of uncertain behavior of skin Left Breast  Skin / nail biopsy Type of biopsy: tangential   Informed consent: discussed and consent obtained   Timeout: patient name, date of birth, surgical site, and procedure verified   Procedure prep:  Patient was prepped and draped in usual sterile fashion (Non sterile) Prep type:  Chlorhexidine Anesthesia: the lesion was anesthetized in a standard fashion   Anesthetic:  1% lidocaine w/ epinephrine 1-100,000 local infiltration Instrument used: flexible razor blade   Outcome: patient tolerated procedure well   Post-procedure details: wound care instructions given    Specimen 1 - Surgical pathology Differential Diagnosis: bcc scc  Check Margins: No  Screening exam for skin cancer Chest - Medial [facility])  Annual dermatologist examination, encouraged to self examine twice yearly.  Continued ultraviolet protection.  Skin tag (2) Neck - Anterior; Left Malar Cheek  Leave if stable  Seborrheic keratosis Head - Anterior (Face)  Recheck as needed change.     I, Lavonna Monarch, MD, have reviewed all documentation for this visit.   The documentation on 05/31/20 for the exam, diagnosis, procedures, and orders are all accurate and complete.

## 2020-05-27 NOTE — Patient Instructions (Signed)

## 2020-05-28 ENCOUNTER — Telehealth: Payer: Self-pay

## 2020-05-28 NOTE — Telephone Encounter (Signed)
LMOM TO R/S 

## 2020-05-30 ENCOUNTER — Encounter: Payer: Self-pay | Admitting: Dermatology

## 2020-06-02 ENCOUNTER — Other Ambulatory Visit: Payer: Self-pay | Admitting: Cardiology

## 2020-06-04 ENCOUNTER — Ambulatory Visit: Payer: 59 | Admitting: Specialist

## 2020-06-08 ENCOUNTER — Other Ambulatory Visit: Payer: Self-pay

## 2020-06-08 ENCOUNTER — Ambulatory Visit (INDEPENDENT_AMBULATORY_CARE_PROVIDER_SITE_OTHER): Payer: 59

## 2020-06-08 DIAGNOSIS — Z7901 Long term (current) use of anticoagulants: Secondary | ICD-10-CM

## 2020-06-08 DIAGNOSIS — Z5181 Encounter for therapeutic drug level monitoring: Secondary | ICD-10-CM

## 2020-06-08 DIAGNOSIS — I480 Paroxysmal atrial fibrillation: Secondary | ICD-10-CM | POA: Diagnosis not present

## 2020-06-08 LAB — POCT INR: INR: 2.3 (ref 2.0–3.0)

## 2020-06-08 NOTE — Patient Instructions (Signed)
Take 1 tablet daily except for 1/2 tablet every Monday, Wednesday and Friday. Repeat INR  in 6 weeks.

## 2020-06-10 ENCOUNTER — Other Ambulatory Visit: Payer: Self-pay

## 2020-06-10 ENCOUNTER — Encounter: Payer: Self-pay | Admitting: Specialist

## 2020-06-10 ENCOUNTER — Ambulatory Visit (INDEPENDENT_AMBULATORY_CARE_PROVIDER_SITE_OTHER): Payer: 59 | Admitting: Specialist

## 2020-06-10 VITALS — BP 133/67 | HR 71 | Ht 75.0 in | Wt 265.0 lb

## 2020-06-10 DIAGNOSIS — M4807 Spinal stenosis, lumbosacral region: Secondary | ICD-10-CM | POA: Diagnosis not present

## 2020-06-10 DIAGNOSIS — R29898 Other symptoms and signs involving the musculoskeletal system: Secondary | ICD-10-CM

## 2020-06-10 DIAGNOSIS — M5417 Radiculopathy, lumbosacral region: Secondary | ICD-10-CM | POA: Diagnosis not present

## 2020-06-10 DIAGNOSIS — M48062 Spinal stenosis, lumbar region with neurogenic claudication: Secondary | ICD-10-CM | POA: Diagnosis not present

## 2020-06-10 NOTE — Progress Notes (Signed)
Office Visit Note   Patient: [name]           Date of Birth: [birth year]           MRN: [id number] Visit Date: 06/10/2020              Requested by: Timoteo Gaul, [place],  [place] [id number] PCP: Timoteo Gaul, FNP   Assessment & Plan: Visit Diagnoses:  1. Spinal stenosis, lumbar region with neurogenic claudication   2. Lumbosacral radiculopathy   3. Spinal stenosis of lumbar region with neurogenic claudication   4. Weakness of right leg   5. Spinal stenosis of lumbosacral region   6. Weakness of left hip     Plan: Plan: Avoid bending, stooping and avoid lifting weights greater than 10 lbs. Avoid prolong standing and walking. Avoid frequent bending and stooping  No lifting greater than 10 lbs. May use ice or moist heat for pain. Weight loss is of benefit. Handicap license is approved. Dr. Romona Curls secretary/Assistant will call to arrange for repeat left L3-4 Transforamenal epidural steroid  Physical therapy to work on core strengthening and left leg strengthening of the hip flexors.   Follow-Up Instructions: Return in about 6 weeks (around 07/22/2020).   Orders:  Orders Placed This Encounter  Procedures  . Ambulatory referral to Physical Medicine Rehab  . Ambulatory referral to Physical Therapy   No orders of the defined types were placed in this encounter.     Procedures: No procedures performed   Clinical Data: No additional findings.   Subjective: Chief Complaint  Patient presents with  . Lower Back - Follow-up    Had a Bil L3-4 TF injection, not it did a lot.      65 year old male with history of neck pain and right arm numbness and weakness into the left and right shoulders. He still has tingling into the left arm. He is okay with externally rotating the left arm but when he internally rotates to neutral or to return to hand in front he feel a catch into the left arm and has pain. He reports the pain is there when he goes  to pet his dog while. He has back pain and is self employed. He has difficulty raising the left leg and foot off the floor and getting in and out of his car. He has had two sets of ESIs with good relief of pain The last was done bilateral L3-4 transforaminal. 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 He finds that the hydrocodone doesn't touch the pain but the percocet is effective. The ESI's deceased the pain by 80% for about 1.5 weeks. He relates the left knee pain is worse than the right.    Review of Systems   Objective: Vital Signs: BP 133/67 (BP Location: Left Arm, Patient Position: Sitting)   Pulse 71   Ht 6\' 3"  (1.905 m)   Wt 265 lb (120.2 kg)   BMI 33.12 kg/m   Physical Exam  Ortho Exam  Specialty Comments:  No specialty comments available.  Imaging: No results found.   PMFS History: Patient Active Problem List   Diagnosis Date Noted  . Dysphagia 04/17/2020  . LVH (left ventricular hypertrophy) 11/07/2019  . Heme positive stool 10/03/2019  . Anemia 10/03/2019  . Insulin dependent type 2 diabetes mellitus ([place]) 08/26/2019  . Acute on chronic diastolic heart failure ([place]) 08/26/2019  .  Rash and nonspecific skin eruption 08/26/2019  . Foot pain 08/22/2019  . Right heart failure ([place]) 08/07/2019  . Leg swelling 07/28/2019  . Educated about COVID-19 virus infection 07/28/2019  . Diabetes ([place]) 06/12/2019  . Urticaria 05/15/2019  . Dermatitis due to drug 04/18/2019  . Edema 03/29/2019  . Erectile dysfunction 03/29/2019  . High risk medication use 03/29/2019  . History of iron deficiency 03/29/2019  . Pulmonary hypertension ([place]) 03/29/2019  . Venous stasis dermatitis of both lower extremities 03/29/2019  . Mixed hyperlipidemia 02/14/2019  . Onychomycosis due to dermatophyte 11/06/2018  . Hammer toe of right foot 07/17/2018  . Tinea pedis of both feet 07/17/2018  . History of radiofrequency ablation (RFA) procedure for  cardiac arrhythmia 06/21/2018  . Lumbar back pain with radiculopathy affecting lower extremity 06/21/2018  . Hepatic cirrhosis ([place]) 12/26/2017  . Left lower quadrant pain 12/05/2017  . Change in bowel habits 12/05/2017  . Palpitations 10/03/2017  . SOB (shortness of breath) 04/12/2017  . Chest pain with moderate risk of acute coronary syndrome 08/28/2016  . Unstable angina ([place]) 08/26/2016  . Longstanding persistent atrial fibrillation ([place]) 04/21/2015  . Deviated nasal septum 02/19/2014  . History of colon polyps 11/22/2012  . Syncope 10/17/2012  . Acute asthmatic bronchitis 11/22/2011  . Benign neoplasm of colon 10/13/2011  . Unspecified gastritis and gastroduodenitis without mention of hemorrhage 10/13/2011  . GERD (gastroesophageal reflux disease) 10/13/2011  . Obesity (BMI 30-39.9) 09/30/2011  . Dyslipidemia 06/23/2011  . Neuropathy 06/23/2011  . Fatigue 05/13/2011  . Malaise and fatigue 05/13/2011  . Rectal bleeding 04/18/2011  . Atrial flutter ([place]) 04/18/2011  . Chronic anticoagulation 11/10/2010  . PAF (paroxysmal atrial fibrillation) ([place])   . Tobacco abuse   . COPD (chronic obstructive pulmonary disease) ([place]) 02/11/2010  . Essential hypertension 01/28/2010  . OSA (obstructive sleep apnea) 01/28/2010   Past Medical History:  Diagnosis Date  . Arthritis    lower back  . Asthma   . Atrial flutter ([place])    s/p CTI ablation by Dr Rayann Heman  . Brain aneurysm 2009   questionable. A follow up CTA in 2009 showed no evidence of  . Chronic back pain    DDD/stenosis  . Colon polyps    9 polyps removed 10/13/11  . Complication of anesthesia September 11, 2012   slow to awaken after ablation  . COPD (chronic obstructive pulmonary disease) ([place])   . Diabetes mellitus    takes Metformin and Glimepiride daily  . Emphysema   . GERD (gastroesophageal reflux disease)    takes Omeprazole bid  . History of shingles   . HLD (hyperlipidemia)    takes Pravastatin daily  . HTN  (hypertension)    takes Prinizide daily  . Obesity   . OSA (obstructive sleep apnea)    not always using cpap  . Overdose 2009   unintentional Flecanide overdose  . Peripheral neuropathy   . Short-term memory loss   . Tobacco abuse     Family History  Problem Relation Age of Onset  . Diabetes Mother   . Heart disease Father   . Lung cancer Father   . Diabetes Maternal Grandmother   . Colon cancer Neg Hx   . Anesthesia problems Neg Hx   . Hypotension Neg Hx   . Malignant hyperthermia Neg Hx   . Pseudochol deficiency Neg Hx   . Esophageal cancer Neg Hx   . Pancreatic cancer Neg Hx   . Stomach cancer Neg Hx  Past Surgical History:  Procedure Laterality Date  . ANTERIOR CERVICAL DECOMP/DISCECTOMY FUSION  01/04/2012   Procedure: ANTERIOR CERVICAL DECOMPRESSION/DISCECTOMY FUSION 1 LEVEL/HARDWARE REMOVAL;  Surgeon: Ophelia Charter, MD;  Location: [facility];  Service: Neurosurgery;  Laterality: N/A;  explore cervical fusion Cervical six - seven  with removal of codman plate anterior cervical decompression with fusion interbody prothesis plating and bonegraft  . APPENDECTOMY  2-64yrs ago  . ATRIAL FIBRILLATION ABLATION N/A 09/11/2012   PT DID NOT HAVE AN ATRIAL FIBRILLATION ABLATION IN 2014!  ATRIAL FLUTTER ABLATION ONLY  . ATRIAL FLUTTER ABLATION  09/11/2012   CTI ablation by Dr Rayann Heman  . BIOPSY  11/07/2019   Procedure: BIOPSY;  Surgeon: Milus Banister, MD;  Location: [facility];  Service: Endoscopy;;  . CARDIAC CATHETERIZATION  2008   no significant CAD  . CARDIOVERSION  05/05/2011   Procedure: CARDIOVERSION;  Surgeon: Lelon Perla, MD;  [facility];  Service: Cardiovascular;  Laterality: N/A;  . CARDIOVERSION Bilateral 07/26/2012   Procedure: CARDIOVERSION;  Surgeon: Minus Breeding, MD;  Location: [facility] ENDOSCOPY;  Service: Cardiovascular;  Laterality: Bilateral;  . CARPAL TUNNEL RELEASE  99/2000   bilateral  . COLONOSCOPY WITH PROPOFOL N/A 12/13/2012    Procedure: COLONOSCOPY WITH PROPOFOL;  Surgeon: Milus Banister, MD;  Location: [facility];  Service: Endoscopy;  Laterality: N/A;  . ELECTROPHYSIOLOGIC STUDY N/A 04/21/2015   Procedure: Atrial Fibrillation Ablation;  Surgeon: Thompson Grayer, MD;  Location: [facility] LAB;  Service: Cardiovascular;  Laterality: N/A;  . ESOPHAGOGASTRODUODENOSCOPY (EGD) WITH PROPOFOL N/A 11/07/2019   Procedure: ESOPHAGOGASTRODUODENOSCOPY (EGD) WITH PROPOFOL;  Surgeon: Milus Banister, MD;  [facility];  Service: Endoscopy;  Laterality: N/A;  . HERNIA REPAIR    . KNEE SURGERY  6-79yrs ago   left  . LEFT HEART CATH AND CORONARY ANGIOGRAPHY N/A 09/19/2016   Procedure: Left Heart Cath and Coronary Angiography;  Surgeon: Belva Crome, MD;  Location: [facility];  Service: Cardiovascular;  Laterality: N/A;  . Left inguinal hernia repair     as a child  . NASAL SEPTOPLASTY W/ TURBINOPLASTY Bilateral 02/19/2014   Procedure: NASAL SEPTOPLASTY WITH BILATERAL TURBINATE REDUCTION;  Surgeon: Jodi Marble, MD;  Location: [facility];  Service: ENT;  Laterality: Bilateral;  . NECK SURGERY  44yrs ago  . right shoulder surgery  4-19yrs ago   cyst removed  . TEE WITHOUT CARDIOVERSION  05/05/2011   Procedure: TRANSESOPHAGEAL ECHOCARDIOGRAM (TEE);  Surgeon: Lelon Perla, MD;  Location[facility] ENDOSCOPY;  Service: Cardiovascular;  Laterality: N/A;  . TEE WITHOUT CARDIOVERSION N/A 04/21/2015   Procedure: TRANSESOPHAGEAL ECHOCARDIOGRAM (TEE);  Surgeon: Sueanne Margarita, MD;  Location: [facility] ENDOSCOPY;  Service: Cardiovascular;  Laterality: N/A;  . THROAT SURGERY  4-82yrs ago   "thought " it was cancer but came back not  . TONSILLECTOMY     Social History   Occupational History  . Occupation: Dealer   Tobacco Use  . Smoking status: Current Every Day Smoker    Packs/day: 1.00    Years: 49.00    Pack years: 49.00    Types: Cigarettes  . Smokeless tobacco: Never Used  . Tobacco comment: 1 pack per day 04/16/2020   Vaping Use  . Vaping Use: Never used  Substance and Sexual Activity  . Alcohol use: Not Currently    Alcohol/week: 0.0 standard drinks    Comment: 12/05/17 pt stated he has drinked 1/2 drink in 3 years  . Drug use: No  .  Sexual activity: Yes

## 2020-06-10 NOTE — Patient Instructions (Signed)
Plan: Avoid bending, stooping and avoid lifting weights greater than 10 lbs. Avoid prolong standing and walking. Avoid frequent bending and stooping  No lifting greater than 10 lbs. May use ice or moist heat for pain. Weight loss is of benefit. Handicap license is approved. Dr. Romona Curls secretary/Assistant will call to arrange for repeat left L3-4 Transforamenal epidural steroid  Physical therapy to work on core strengthening and left leg strengthening of the hip flexors.

## 2020-06-15 ENCOUNTER — Other Ambulatory Visit: Payer: Self-pay

## 2020-06-17 ENCOUNTER — Telehealth: Payer: Self-pay | Admitting: Pulmonary Disease

## 2020-06-17 ENCOUNTER — Ambulatory Visit (INDEPENDENT_AMBULATORY_CARE_PROVIDER_SITE_OTHER): Payer: 59 | Admitting: Endocrinology

## 2020-06-17 ENCOUNTER — Other Ambulatory Visit: Payer: Self-pay

## 2020-06-17 VITALS — BP 140/60 | HR 75 | Ht 75.0 in | Wt 269.2 lb

## 2020-06-17 DIAGNOSIS — N1831 Chronic kidney disease, stage 3a: Secondary | ICD-10-CM

## 2020-06-17 DIAGNOSIS — E1122 Type 2 diabetes mellitus with diabetic chronic kidney disease: Secondary | ICD-10-CM | POA: Diagnosis not present

## 2020-06-17 DIAGNOSIS — Z794 Long term (current) use of insulin: Secondary | ICD-10-CM

## 2020-06-17 LAB — POCT GLYCOSYLATED HEMOGLOBIN (HGB A1C): Hemoglobin A1C: 8.1 % — AB (ref 4.0–5.6)

## 2020-06-17 MED ORDER — INSULIN ASPART FLEXPEN 100 UNIT/ML ~~LOC~~ SOPN: 70.0000 [IU] | PEN_INJECTOR | Freq: Two times a day (BID) | SUBCUTANEOUS | 3 refills | Status: DC

## 2020-06-17 MED ORDER — BD PEN NEEDLE MINI U/F 31G X 5 MM MISC
1.0000 | Freq: Three times a day (TID) | 3 refills | Status: DC
Start: 2020-06-17 — End: 2021-07-02

## 2020-06-17 MED ORDER — DOXYCYCLINE HYCLATE 100 MG PO TABS: 100.0000 mg | ORAL_TABLET | Freq: Every day | ORAL | 0 refills | Status: DC

## 2020-06-17 MED ORDER — TRULICITY 1.5 MG/0.5ML ~~LOC~~ SOAJ: 1.5000 mg | SUBCUTANEOUS | 3 refills | Status: DC

## 2020-06-17 NOTE — Telephone Encounter (Signed)
Called and spoke with Patient. Dr. Bari Mantis recommendations given.  Understanding stated. Prescription sent to [facility].  Nothing further at this time.

## 2020-06-17 NOTE — Telephone Encounter (Signed)
Doxycycline 100 milligrams daily for 7 days 

## 2020-06-17 NOTE — Progress Notes (Signed)
Subjective:    Patient ID: [name], male    DOB: [birth year], 65 y.o.   MRN: [id number]  HPI Pt returns for f/u of diabetes mellitus:  DM type: Insulin-requiring type 2 Dx'ed: 0000000 Complications: stage 3 CRI.   Therapy: insulin since 2019.   DKA: never Severe hypoglycemia: once (2021, when a meal was delayed after taking Novolog) Pancreatitis: never Pancreatic imaging: normal on 2017 CT.  SDOH: he declines insulins other than Novolog. Other: he takes multiple daily injections; he did not tolerate Jardiance (dehydration) or time-release insulins (rash); metformin dosage is limited by abd pain; Interval history: Pt says he takes Novolog 80 units 2-3 times a day (just before each meal).  no cbg record, but states cbg's vary from 60-532.  He had 2 steroid injections approx 10 days ago.  He says continuous glucose monitor sensor keeps falling off.   Past Medical History:  Diagnosis Date  . Arthritis    lower back  . Asthma   . Atrial flutter ([place])    s/p CTI ablation by Dr Rayann Heman  . Brain aneurysm 2009   questionable. A follow up CTA in 2009 showed no evidence of  . Chronic back pain    DDD/stenosis  . Colon polyps    9 polyps removed 10/13/11  . Complication of anesthesia September 11, 2012   slow to awaken after ablation  . COPD (chronic obstructive pulmonary disease) ([place])   . Diabetes mellitus    takes Metformin and Glimepiride daily  . Emphysema   . GERD (gastroesophageal reflux disease)    takes Omeprazole bid  . History of shingles   . HLD (hyperlipidemia)    takes Pravastatin daily  . HTN (hypertension)    takes Prinizide daily  . Obesity   . OSA (obstructive sleep apnea)    not always using cpap  . Overdose 2009   unintentional Flecanide overdose  . Peripheral neuropathy   . Short-term memory loss   . Tobacco abuse     Past Surgical History:  Procedure Laterality Date  . ANTERIOR CERVICAL DECOMP/DISCECTOMY FUSION  01/04/2012   Procedure: ANTERIOR CERVICAL  DECOMPRESSION/DISCECTOMY FUSION 1 LEVEL/HARDWARE REMOVAL;  Surgeon: Ophelia Charter, MD;  Location: [facility];  Service: Neurosurgery;  Laterality: N/A;  explore cervical fusion Cervical six - seven  with removal of codman plate anterior cervical decompression with fusion interbody prothesis plating and bonegraft  . APPENDECTOMY  2-19yrs ago  . ATRIAL FIBRILLATION ABLATION N/A 09/11/2012   PT DID NOT HAVE AN ATRIAL FIBRILLATION ABLATION IN 2014!  ATRIAL FLUTTER ABLATION ONLY  . ATRIAL FLUTTER ABLATION  09/11/2012   CTI ablation by Dr Rayann Heman  . BIOPSY  11/07/2019   Procedure: BIOPSY;  Surgeon: Milus Banister, MD;  [facility];  Service: Endoscopy;;  . CARDIAC CATHETERIZATION  2008   no significant CAD  . CARDIOVERSION  05/05/2011   Procedure: CARDIOVERSION;  Surgeon: Lelon Perla, MD;  Location: [facility] ENDOSCOPY;  Service: Cardiovascular;  Laterality: N/A;  . CARDIOVERSION Bilateral 07/26/2012   Procedure: CARDIOVERSION;  Surgeon: Minus Breeding, MD;  Location: [facility] ENDOSCOPY;  Service: Cardiovascular;  Laterality: Bilateral;  . CARPAL TUNNEL RELEASE  99/2000   bilateral  . COLONOSCOPY WITH PROPOFOL N/A 12/13/2012   Procedure: COLONOSCOPY WITH PROPOFOL;  Surgeon: Milus Banister, MD;  [facility];  Service: Endoscopy;  Laterality: N/A;  . ELECTROPHYSIOLOGIC STUDY N/A 04/21/2015   Procedure: Atrial Fibrillation Ablation;  Surgeon: Thompson Grayer, MD;  Location: [place] [facility]  LAB;  Service: Cardiovascular;  Laterality: N/A;  . ESOPHAGOGASTRODUODENOSCOPY (EGD) WITH PROPOFOL N/A 11/07/2019   Procedure: ESOPHAGOGASTRODUODENOSCOPY (EGD) WITH PROPOFOL;  Surgeon: Milus Banister, MD;  [facility];  Service: Endoscopy;  Laterality: N/A;  . HERNIA REPAIR    . KNEE SURGERY  6-54yrs ago   left  . LEFT HEART CATH AND CORONARY ANGIOGRAPHY N/A 09/19/2016   Procedure: Left Heart Cath and Coronary Angiography;  Surgeon: Belva Crome, MD;  Location: [facility] CV LAB;  Service:  Cardiovascular;  Laterality: N/A;  . Left inguinal hernia repair     as a child  . NASAL SEPTOPLASTY W/ TURBINOPLASTY Bilateral 02/19/2014   Procedure: NASAL SEPTOPLASTY WITH BILATERAL TURBINATE REDUCTION;  Surgeon: Jodi Marble, MD;  Location: [facility];  Service: ENT;  Laterality: Bilateral;  . NECK SURGERY  82yrs ago  . right shoulder surgery  4-88yrs ago   cyst removed  . TEE WITHOUT CARDIOVERSION  05/05/2011   Procedure: TRANSESOPHAGEAL ECHOCARDIOGRAM (TEE);  Surgeon: Lelon Perla, MD;  Location: [facility] ENDOSCOPY;  Service: Cardiovascular;  Laterality: N/A;  . TEE WITHOUT CARDIOVERSION N/A 04/21/2015   Procedure: TRANSESOPHAGEAL ECHOCARDIOGRAM (TEE);  Surgeon: Sueanne Margarita, MD;  Location: [facility] ENDOSCOPY;  Service: Cardiovascular;  Laterality: N/A;  . THROAT SURGERY  4-38yrs ago   "thought " it was cancer but came back not  . TONSILLECTOMY      Social History   Socioeconomic History  . Marital status: Married    Spouse name: Not on file  . Number of children: 2  . Years of education: Not on file  . Highest education level: Not on file  Occupational History  . Occupation: Dealer   Tobacco Use  . Smoking status: Current Every Day Smoker    Packs/day: 1.00    Years: 49.00    Pack years: 49.00    Types: Cigarettes  . Smokeless tobacco: Never Used  . Tobacco comment: 1 pack per day 04/16/2020  Vaping Use  . Vaping Use: Never used  Substance and Sexual Activity  . Alcohol use: Not Currently    Alcohol/week: 0.0 standard drinks    Comment: 12/05/17 pt stated he has drinked 1/2 drink in 3 years  . Drug use: No  . Sexual activity: Yes  Other Topics Concern  . Not on file  Social History Narrative   Daily caffeine(Mountain Dew)       Lives in [place] with spouse.   Unemployed due to chronic back/ leg pain         Social Determinants of Health   Financial Resource Strain: Not on file  Food Insecurity: Not on file  Transportation Needs: Not on file  Physical  Activity: Not on file  Stress: Not on file  Social Connections: Not on file  Intimate Partner Violence: Not on file    Current Outpatient Medications on File Prior to Visit  Medication Sig Dispense Refill  . albuterol (PROAIR HFA) 108 (90 Base) MCG/ACT inhaler Inhale 1-2 puffs into the lungs every 6 (six) hours as needed for wheezing or shortness of breath. 18 g 5  . Ascorbic Acid (VITAMIN C PO) Take 500 mg by mouth daily.     [name] b complex vitamins tablet Take 1 tablet by mouth daily.     . bisoprolol (ZEBETA) 10 MG tablet Take 10 mg by mouth daily.     . Continuous Blood Gluc Sensor (FREESTYLE LIBRE 2 SENSOR) MISC 1 Device by Does not apply route every 14 (fourteen) days.  6 each 3  . diltiazem (TIAZAC) 120 MG 24 hr capsule Take 1 capsule by mouth daily.     [name] gabapentin (NEURONTIN) 300 MG capsule Take 1 capsule (300 mg total) by mouth 2 (two) times daily. History of CHF and cirrhosis and renal insufficiency requires less dose. 60 capsule 3  . levocetirizine (XYZAL) 5 MG tablet Take 5 mg by mouth every evening.     [name] losartan (COZAAR) 50 MG tablet Take 1 tablet (50 mg total) by mouth daily. 90 tablet 3  . metFORMIN (GLUCOPHAGE-XR) 500 MG 24 hr tablet Take 1 tablet (500 mg total) by mouth at bedtime. 90 tablet 3  . metolazone (ZAROXOLYN) 2.5 MG tablet TAKE 1 TABLET BY MOUTH ON TUESDAY AND THURSDAY 30 MINUTES BEFORE TAKING TORSEMIDE OR AS DIRECTED 15 tablet 3  . Multiple Vitamin (MULTIVITAMIN WITH MINERALS) TABS Take 1 tablet by mouth daily.     . Multiple Vitamins-Minerals (ZINC PO) Take 50 mg by mouth daily.     . nitroGLYCERIN (NITROSTAT) 0.4 MG SL tablet Place 1 tablet (0.4 mg total) under the tongue every 5 (five) minutes x 3 doses as needed for chest pain. 25 tablet 3  . oxyCODONE-acetaminophen (PERCOCET) 10-325 MG tablet Take 1 tablet by mouth 4 (four) times daily as needed.    . pantoprazole (PROTONIX) 20 MG tablet Take 20 mg by mouth 2 (two) times daily.     . potassium chloride SA  (KLOR-CON) 20 MEQ tablet TAKE 1 TABLET BY MOUTH DAILY. TAKE AN EXTRA TABLET ON TUESDAY AND THURSDAY 40 tablet 11  . pravastatin (PRAVACHOL) 80 MG tablet TAKE 1 TABLET BY MOUTH DAILY AT BEDTIME 30 tablet 10  . sildenafil (REVATIO) 20 MG tablet Take 100 mg by mouth daily as needed (erectile dysfunction).     . torsemide (DEMADEX) 20 MG tablet TAKE 2 TABLETS BY MOUTH 2 TIMES DAILY 270 tablet 3  . triamcinolone cream (KENALOG) 0.1 % Apply 1 application topically daily as needed. 453 g 2  . umeclidinium-vilanterol (ANORO ELLIPTA) 62.5-25 MCG/INH AEPB Inhale 1 puff into the lungs daily. 60 each 5  . VITAMIN A PO Take 2,400 mcg by mouth daily.     . Vitamin D, Ergocalciferol, (DRISDOL) 1.25 MG (50000 UT) CAPS capsule Take 50,000 Units by mouth every 7 (seven) days.     [name] warfarin (COUMADIN) 5 MG tablet TAKE 1 TO 1 AND 1/2 TABLETS BY MOUTH DAILY AS DIRECTED BY THE COUMADIN CLINIC 45 tablet 1   No current facility-administered medications on file prior to visit.    Allergies  Allergen Reactions  . Adhesive [Tape]     itching  . Latex Itching    When tape is on the skin too long skin gets red & itching    Family History  Problem Relation Age of Onset  . Diabetes Mother   . Heart disease Father   . Lung cancer Father   . Diabetes Maternal Grandmother   . Colon cancer Neg Hx   . Anesthesia problems Neg Hx   . Hypotension Neg Hx   . Malignant hyperthermia Neg Hx   . Pseudochol deficiency Neg Hx   . Esophageal cancer Neg Hx   . Pancreatic cancer Neg Hx   . Stomach cancer Neg Hx     BP 140/60 (BP Location: Right Arm, Patient Position: Sitting, Cuff Size: Large)   Pulse 75   Ht 6\' 3"  (1.905 m)   Wt 269 lb 3.2 oz (122.1 kg)   SpO2 96%  BMI 33.65 kg/m    Review of Systems Denies nausea    Objective:   Physical Exam VITAL SIGNS:  See vs page GENERAL: no distress Pulses: dorsalis pedis intact bilat.   MSK: no deformity of the feet CV: 1+ bilat leg edema Skin:  no ulcer on the  feet, but the skin is dry.  normal temp on the feet.  Neuro: sensation is intact to touch on the feet, but severely decreased from normal.   Ext: there is bilateral onychomycosis of the toenails.    Lab Results  Component Value Date   HGBA1C 8.1 (A) 06/17/2020       Assessment & Plan:  Insulin-requiring type 2 DM, with stage 3 CRI: uncontrolled.   Patient Instructions  I have sent a prescription to your pharmacy, to double the Trulicity, and: Reduce the Novolog to 70 units twice a day (with meals) check your blood sugar twice a day.  vary the time of day when you check, between before the 3 meals, and at bedtime.  also check if you have symptoms of your blood sugar being too high or too low.  please keep a record of the readings and bring it to your next appointment here (or you can bring the meter itself).  You can write it on any piece of paper.  please call us sooner if your blood sugar goes below 70, or if you have a lot of readings over 200. Please come back for a follow-up appointment in 2 months.

## 2020-06-17 NOTE — Telephone Encounter (Signed)
Called and spoke with patient Pt stated that he believes he may have a sinus infection and/or bronchitis and wanting to get some meds sent in, cant take predizone bc he is diabetic. Productive cough and he states that sputum is sometimes clear and sometimes brown. Some increased shortness of breath depending on what he is doing. No fever, has not been covid tested. States that he has had 2 covid vaccines but did not have his card handy.   Dr. Elsworth Soho please advise

## 2020-06-17 NOTE — Patient Instructions (Addendum)
I have sent a prescription to your pharmacy, to double the Trulicity, and: Reduce the Novolog to 70 units twice a day (with meals) check your blood sugar twice a day.  vary the time of day when you check, between before the 3 meals, and at bedtime.  also check if you have symptoms of your blood sugar being too high or too low.  please keep a record of the readings and bring it to your next appointment here (or you can bring the meter itself).  You can write it on any piece of paper.  please call us sooner if your blood sugar goes below 70, or if you have a lot of readings over 200. Please come back for a follow-up appointment in 2 months.

## 2020-06-19 ENCOUNTER — Ambulatory Visit: Payer: 59 | Admitting: Physical Therapy

## 2020-06-26 ENCOUNTER — Telehealth: Payer: Self-pay | Admitting: Endocrinology

## 2020-06-26 MED ORDER — FIASP FLEXTOUCH 100 UNIT/ML ~~LOC~~ SOPN: 80.0000 [IU] | PEN_INJECTOR | Freq: Two times a day (BID) | SUBCUTANEOUS | 3 refills | Status: DC

## 2020-06-26 NOTE — Telephone Encounter (Signed)
please contact patient: Ins wants you to change to a similar med called Fiasp.  I have sent a prescription to your pharmacy.  I'll see you next time.

## 2020-06-27 ENCOUNTER — Telehealth: Payer: Self-pay

## 2020-06-27 NOTE — Telephone Encounter (Signed)
LVM for pt to let him know that the insurance company wanted him to change to a different medication that was similar to the Insulin Aspart Flexpen so it has now been changed by Loanne Drilling to [place]. He has sent a prescription to your pharmacy.   Also, I will send a message thru MyChart

## 2020-06-27 NOTE — Telephone Encounter (Signed)
Pt stated---decided pick-up the aspart insulin is cheaper then the fiasp.

## 2020-06-29 ENCOUNTER — Ambulatory Visit: Payer: 59 | Admitting: Physical Medicine and Rehabilitation

## 2020-06-30 ENCOUNTER — Encounter: Payer: 59 | Attending: Endocrinology | Admitting: Nutrition

## 2020-06-30 ENCOUNTER — Ambulatory Visit: Payer: 59 | Admitting: Physical Therapy

## 2020-06-30 ENCOUNTER — Other Ambulatory Visit: Payer: Self-pay

## 2020-06-30 DIAGNOSIS — E1122 Type 2 diabetes mellitus with diabetic chronic kidney disease: Secondary | ICD-10-CM | POA: Diagnosis present

## 2020-06-30 DIAGNOSIS — Z794 Long term (current) use of insulin: Secondary | ICD-10-CM | POA: Diagnosis present

## 2020-06-30 DIAGNOSIS — N1831 Chronic kidney disease, stage 3a: Secondary | ICD-10-CM | POA: Diagnosis present

## 2020-07-02 ENCOUNTER — Ambulatory Visit: Payer: 59 | Admitting: Rehabilitative and Restorative Service Providers"

## 2020-07-05 NOTE — Patient Instructions (Signed)
Switch to diet soda and reduce AM dose of insulin accordingly Switch to drinking flavored ice chips and Gatorade 0 that is frozen to limit fluid intake, and help blood sugar control. Reduce PM dose of protein to no more than 3 ounces, or the size of a deck of cards.

## 2020-07-05 NOTE — Progress Notes (Signed)
Patient says he is here today to review his diet. Diabetes medicines:  Trulicity once a week.  Says takes this as directed, Aspart: 80u BID.  Only taking 60u AM, PM: 60-80 variable. 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  Pt. Knows this and knows he needs to decrease fluids, but refuses, saying that "his joints hurt when he does this, and it is very painful." Typical day: 4:30AM:  FBS:  "ussually less than 200 most days"  Takes 60u Aspart 7AM: eats breakfast:  1 sausage, 1 egg, 1 breead, 16 oz regular Coke that he sips on all morning.   12PM: lunch -only once a week, ususally a sandwich.  If no lunch, will eat 2 fruit cups.with 1/2 glass of Coke. 1-2PM: if no lunch, will drink water as well as Coke all after noon.  "If I don't, I will hurt all over".   4-8PM: supper: IF Blood sugar is over 150, he will take his PM dose of 60u.   Supper: Chicken:   In either pot pie, or chicken soup, or ground beef in soup, or occasional steak, or 1 hamburger with non starchy veg.  , and Coke to drink  He is not limiting protein or fluids. 9PM: 1/2 bottle of Gatorade, with popcorn, or fruit cup, or   Discussion: 1.  Importance of limiting amounts of protein to reserve kidney function, and to limit fluids to as little as possible.  Encouraged ice chips, or unsweetended frozen lemonade in ice chips.  And to limit PM dose of protein to no more than 3 ounces- size of a deck of cards.  He did not acknowledge that he would do this.  2.  Stressed need to use diet beverages and then reducing insulin dose accordingly.  Suggested that the high blood sugars after drinking coke were further damaging his kidneys.  He id not bring his Elenor Legato reader to show him how high his readings were going after drinkin sodas.  Suggested he put look on his reader to see the graff of what his blood sugar is doing during the mornings.

## 2020-07-06 ENCOUNTER — Telehealth: Payer: Self-pay

## 2020-07-06 ENCOUNTER — Other Ambulatory Visit: Payer: Self-pay | Admitting: *Deleted

## 2020-07-06 DIAGNOSIS — F17210 Nicotine dependence, cigarettes, uncomplicated: Secondary | ICD-10-CM

## 2020-07-06 NOTE — Telephone Encounter (Signed)
Spoke with pt to let him know that the PA for his Insulin Aspart Flexpen was taken under consideration by the insurance company and he needed to have tried [employer] so [name] has sent in an RX for him to try this medication before [employer] will approve anything else.

## 2020-07-14 ENCOUNTER — Ambulatory Visit: Payer: 59 | Admitting: Gastroenterology

## 2020-07-20 ENCOUNTER — Other Ambulatory Visit: Payer: Self-pay

## 2020-07-20 ENCOUNTER — Ambulatory Visit (INDEPENDENT_AMBULATORY_CARE_PROVIDER_SITE_OTHER): Payer: 59

## 2020-07-20 DIAGNOSIS — Z5181 Encounter for therapeutic drug level monitoring: Secondary | ICD-10-CM

## 2020-07-20 DIAGNOSIS — I480 Paroxysmal atrial fibrillation: Secondary | ICD-10-CM | POA: Diagnosis not present

## 2020-07-20 DIAGNOSIS — Z7901 Long term (current) use of anticoagulants: Secondary | ICD-10-CM

## 2020-07-20 LAB — POCT INR: INR: 1.8 — AB (ref 2.0–3.0)

## 2020-07-20 NOTE — Patient Instructions (Signed)
Take 1.5 tablets tonight only and then continue taking 1 tablet daily except for 1/2 tablet every Monday, Wednesday and Friday. Repeat INR  in 6 weeks.

## 2020-07-22 ENCOUNTER — Ambulatory Visit (INDEPENDENT_AMBULATORY_CARE_PROVIDER_SITE_OTHER): Payer: 59 | Admitting: Specialist

## 2020-07-22 ENCOUNTER — Encounter: Payer: Self-pay | Admitting: Specialist

## 2020-07-22 ENCOUNTER — Ambulatory Visit: Payer: Self-pay

## 2020-07-22 ENCOUNTER — Encounter: Payer: Self-pay | Admitting: Podiatry

## 2020-07-22 ENCOUNTER — Other Ambulatory Visit: Payer: Self-pay

## 2020-07-22 ENCOUNTER — Ambulatory Visit (INDEPENDENT_AMBULATORY_CARE_PROVIDER_SITE_OTHER): Payer: 59 | Admitting: Podiatry

## 2020-07-22 VITALS — BP 129/62 | HR 73 | Ht 75.0 in | Wt 269.0 lb

## 2020-07-22 DIAGNOSIS — Z794 Long term (current) use of insulin: Secondary | ICD-10-CM

## 2020-07-22 DIAGNOSIS — M48062 Spinal stenosis, lumbar region with neurogenic claudication: Secondary | ICD-10-CM | POA: Diagnosis not present

## 2020-07-22 DIAGNOSIS — E1142 Type 2 diabetes mellitus with diabetic polyneuropathy: Secondary | ICD-10-CM

## 2020-07-22 DIAGNOSIS — B351 Tinea unguium: Secondary | ICD-10-CM | POA: Diagnosis not present

## 2020-07-22 DIAGNOSIS — E0843 Diabetes mellitus due to underlying condition with diabetic autonomic (poly)neuropathy: Secondary | ICD-10-CM

## 2020-07-22 DIAGNOSIS — M5116 Intervertebral disc disorders with radiculopathy, lumbar region: Secondary | ICD-10-CM | POA: Diagnosis not present

## 2020-07-22 DIAGNOSIS — M5417 Radiculopathy, lumbosacral region: Secondary | ICD-10-CM

## 2020-07-22 DIAGNOSIS — M7122 Synovial cyst of popliteal space [Baker], left knee: Secondary | ICD-10-CM

## 2020-07-22 DIAGNOSIS — M79676 Pain in unspecified toe(s): Secondary | ICD-10-CM

## 2020-07-22 DIAGNOSIS — Z7901 Long term (current) use of anticoagulants: Secondary | ICD-10-CM

## 2020-07-22 NOTE — Patient Instructions (Signed)
Avoid bending, stooping and avoid lifting weights greater than 10 lbs. Avoid prolong standing and walking. Avoid frequent bending and stooping  No lifting greater than 10 lbs. May use ice or moist heat for pain. Weight loss is of benefit. Handicap license is approved. Dr. Romona Curls secretary/Assistant will call to arrange for epidural steroid injection  EMG/NCV left leg to assess for Lumbar radiculopathy Ultrasound left knee to determine with he has popliteal cyst.

## 2020-07-22 NOTE — Progress Notes (Signed)
Office Visit Note   Patient: [name]           Date of Birth: 01/19/1956           MRN: [id number] Visit Date: 07/22/2020              Requested by: Timoteo Gaul, [place],  [place] [id number] PCP: Patient, No Pcp Per   Assessment & Plan: Visit Diagnoses:  1. Lumbosacral radiculopathy   2. Spinal stenosis of lumbar region with neurogenic claudication   3. Lumbar disc herniation with radiculopathy   4. Spinal stenosis, lumbar region with neurogenic claudication   5. Type 2 diabetes mellitus with diabetic polyneuropathy, with long-term current use of insulin ([place])   6. Popliteal cyst, left     Plan: Avoid bending, stooping and avoid lifting weights greater than 10 lbs. Avoid prolong standing and walking. Avoid frequent bending and stooping  No lifting greater than 10 lbs. May use ice or moist heat for pain. Weight loss is of benefit. Handicap license is approved. Dr. Romona Curls secretary/Assistant will call to arrange for epidural steroid injection  EMG/NCV left leg to assess for Lumbar radiculopathy Ultrasound left knee to determine with he has popliteal cyst  Follow-Up Instructions: No follow-ups on file.   Orders:  No orders of the defined types were placed in this encounter.  No orders of the defined types were placed in this encounter.     Procedures: No procedures performed   Clinical Data: No additional findings.   Subjective: Chief Complaint  Patient presents with  . Lower Back - Follow-up    65 year old left handed male with history of low back pain and radiation into the left leg.  Has had issues with falling off the couch daily when sleeping. He is avoiding standing and walking, at times it makes the pain worse, standing and washing dishes increases the discomfort. 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 Constipation, I'm hurting soo bad. Can lift the right leg but not the left.      Review of Systems  Constitutional: Negative.   HENT: Positive for postnasal drip, rhinorrhea, sinus pressure and sinus pain.   Eyes: Negative.   Respiratory: Positive for cough, choking and shortness of breath.   Cardiovascular: Negative.   Gastrointestinal: Negative.   Endocrine: Negative.   Genitourinary: Negative.   Musculoskeletal: Positive for back pain, joint swelling and neck pain.  Skin: Negative.   Allergic/Immunologic: Negative.   Neurological: Negative.   Hematological: Negative.   Psychiatric/Behavioral: Negative.      Objective: Vital Signs: BP 129/62 (BP Location: Left Arm, Patient Position: Sitting)   Pulse 73   Ht 6\' 3"  (1.905 m)   Wt 269 lb (122 kg)   BMI 33.62 kg/m   Physical Exam Constitutional:      Appearance: He is well-developed and well-nourished.  HENT:     Head: Normocephalic and atraumatic.  Eyes:     Extraocular Movements: EOM normal.     Pupils: Pupils are equal, round, and reactive to light.  Pulmonary:     Effort: Pulmonary effort is normal.     Breath sounds: Normal breath sounds.  Abdominal:     General: Bowel sounds are normal.     Palpations: Abdomen is soft.  Musculoskeletal:     Cervical back: Normal range of motion and neck supple.     Lumbar back: Negative right straight leg raise test and  negative left straight leg raise test.  Skin:    General: Skin is warm and dry.  Neurological:     Mental Status: He is alert and oriented to person, place, and time.  Psychiatric:        Mood and Affect: Mood and affect normal.        Behavior: Behavior normal.        Thought Content: Thought content normal.        Judgment: Judgment normal.     Back Exam   Tenderness  The patient is experiencing tenderness in the lumbar.  Range of Motion  Extension: abnormal  Flexion: abnormal  Lateral bend right: abnormal  Lateral bend left: abnormal  Rotation right: abnormal  Rotation left: abnormal   Muscle Strength  Right  Quadriceps:  5/5  Left Quadriceps:  5/5  Right Hamstrings:  5/5  Left Hamstrings:  5/5   Tests  Straight leg raise right: negative Straight leg raise left: negative  Reflexes  Patellar: 1/4 Achilles: 1/4 Biceps: 1/4  Other  Toe walk: normal Heel walk: normal      Specialty Comments:  No specialty comments available.  Imaging: No results found.   PMFS History: Patient Active Problem List   Diagnosis Date Noted  . Dysphagia 04/17/2020  . LVH (left ventricular hypertrophy) 11/07/2019  . Heme positive stool 10/03/2019  . Anemia 10/03/2019  . Insulin dependent type 2 diabetes mellitus ([place]) 08/26/2019  . Acute on chronic diastolic heart failure ([place]) 08/26/2019  . Rash and nonspecific skin eruption 08/26/2019  . Foot pain 08/22/2019  . Right heart failure ([place]) 08/07/2019  . Leg swelling 07/28/2019  . Educated about COVID-19 virus infection 07/28/2019  . Diabetes ([place]) 06/12/2019  . Urticaria 05/15/2019  . Dermatitis due to drug 04/18/2019  . Edema 03/29/2019  . Erectile dysfunction 03/29/2019  . High risk medication use 03/29/2019  . History of iron deficiency 03/29/2019  . Pulmonary hypertension ([place]) 03/29/2019  . Venous stasis dermatitis of both lower extremities 03/29/2019  . Mixed hyperlipidemia 02/14/2019  . Onychomycosis due to dermatophyte 11/06/2018  . Hammer toe of right foot 07/17/2018  . Tinea pedis of both feet 07/17/2018  . History of radiofrequency ablation (RFA) procedure for cardiac arrhythmia 06/21/2018  . Lumbar back pain with radiculopathy affecting lower extremity 06/21/2018  . Hepatic cirrhosis ([place]) 12/26/2017  . Left lower quadrant pain 12/05/2017  . Change in bowel habits 12/05/2017  . Palpitations 10/03/2017  . SOB (shortness of breath) 04/12/2017  . Chest pain with moderate risk of acute coronary syndrome 08/28/2016  . Unstable angina ([place]) 08/26/2016  . Longstanding persistent atrial fibrillation ([place]) 04/21/2015  . Deviated  nasal septum 02/19/2014  . History of colon polyps 11/22/2012  . Syncope 10/17/2012  . Acute asthmatic bronchitis 11/22/2011  . Benign neoplasm of colon 10/13/2011  . Unspecified gastritis and gastroduodenitis without mention of hemorrhage 10/13/2011  . GERD (gastroesophageal reflux disease) 10/13/2011  . Obesity (BMI 30-39.9) 09/30/2011  . Dyslipidemia 06/23/2011  . Neuropathy 06/23/2011  . Fatigue 05/13/2011  . Malaise and fatigue 05/13/2011  . Rectal bleeding 04/18/2011  . Atrial flutter ([place]) 04/18/2011  . Chronic anticoagulation 11/10/2010  . PAF (paroxysmal atrial fibrillation) ([place])   . Tobacco abuse   . COPD (chronic obstructive pulmonary disease) ([place]) 02/11/2010  . Essential hypertension 01/28/2010  . OSA (obstructive sleep apnea) 01/28/2010   Past Medical History:  Diagnosis Date  . Arthritis    lower back  . Asthma   . Atrial flutter (  [place])    s/p CTI ablation by Dr Rayann Heman  . Brain aneurysm 2009   questionable. A follow up CTA in 2009 showed no evidence of  . Chronic back pain    DDD/stenosis  . Colon polyps    9 polyps removed 10/13/11  . Complication of anesthesia September 11, 2012   slow to awaken after ablation  . COPD (chronic obstructive pulmonary disease) ([place])   . Diabetes mellitus    takes Metformin and Glimepiride daily  . Emphysema   . GERD (gastroesophageal reflux disease)    takes Omeprazole bid  . History of shingles   . HLD (hyperlipidemia)    takes Pravastatin daily  . HTN (hypertension)    takes Prinizide daily  . Obesity   . OSA (obstructive sleep apnea)    not always using cpap  . Overdose 2009   unintentional Flecanide overdose  . Peripheral neuropathy   . Short-term memory loss   . Tobacco abuse     Family History  Problem Relation Age of Onset  . Diabetes Mother   . Heart disease Father   . Lung cancer Father   . Diabetes Maternal Grandmother   . Colon cancer Neg Hx   . Anesthesia problems Neg Hx   . Hypotension Neg Hx   .  Malignant hyperthermia Neg Hx   . Pseudochol deficiency Neg Hx   . Esophageal cancer Neg Hx   . Pancreatic cancer Neg Hx   . Stomach cancer Neg Hx     Past Surgical History:  Procedure Laterality Date  . ANTERIOR CERVICAL DECOMP/DISCECTOMY FUSION  01/04/2012   Procedure: ANTERIOR CERVICAL DECOMPRESSION/DISCECTOMY FUSION 1 LEVEL/HARDWARE REMOVAL;  Surgeon: Ophelia Charter, MD;  Location: [facility];  Service: Neurosurgery;  Laterality: N/A;  explore cervical fusion Cervical six - seven  with removal of codman plate anterior cervical decompression with fusion interbody prothesis plating and bonegraft  . APPENDECTOMY  2-78yrs ago  . ATRIAL FIBRILLATION ABLATION N/A 09/11/2012   PT DID NOT HAVE AN ATRIAL FIBRILLATION ABLATION IN 2014!  ATRIAL FLUTTER ABLATION ONLY  . ATRIAL FLUTTER ABLATION  09/11/2012   CTI ablation by Dr Rayann Heman  . BIOPSY  11/07/2019   Procedure: BIOPSY;  Surgeon: Milus Banister, MD;  Location: [facility] ENDOSCOPY;  Service: Endoscopy;;  . CARDIAC CATHETERIZATION  2008   no significant CAD  . CARDIOVERSION  05/05/2011   Procedure: CARDIOVERSION;  Surgeon: Lelon Perla, MD;  Location[facility] END[facility]COPY;  Service: Cardiovascular;  Laterality: N/A;  . CARDIOVERSION Bilateral 07/26/2012   Procedure: CARDIOVERSION;  Surgeon: Minus Breeding, MD;  Location[facility];  Service: Cardiovascular;  Laterality: Bilateral;  . CARPAL TUNNEL RELEASE  99/2000   bilateral  . COLONOSCOPY WITH PROPOFOL N/A 12/13/2012   Procedure: COLONOSCOPY WITH PROPOFOL;  Surgeon: Milus Banister, MD;  Location: [facility] ENDOSCOPY;  Service: Endoscopy;  Laterality: N/A;  . ELECTROPHYSIOLOGIC STUDY N/A 04/21/2015   Procedure: Atrial Fibrillation Ablation;  Surgeon: Thompson Grayer, MD;  Location: [facility] CV LAB;  Service: Cardiovascular;  Laterality: N/A;  . ESOPHAGOGASTRODUODENOSCOPY (EGD) WITH PROPOFOL N/A 11/07/2019   Procedure: ESOPHAGOGASTRODUODENOSCOPY (EGD) WITH PROPOFOL;  Surgeon: Milus Banister, MD;   Location: [facility] ENDOSCOPY;  Service: Endoscopy;  Laterality: N/A;  . HERNIA REPAIR    . KNEE SURGERY  6-56yrs ago   left  . LEFT HEART CATH AND CORONARY ANGIOGRAPHY N/A 09/19/2016   Procedure: Left Heart Cath and Coronary Angiography;  Surgeon: Belva Crome, MD;  Location: [facility] CV LAB;  Service: Cardiovascular;  Laterality: N/A;  . Left inguinal hernia repair     as a child  . NASAL SEPTOPLASTY W/ TURBINOPLASTY Bilateral 02/19/2014   Procedure: NASAL SEPTOPLASTY WITH BILATERAL TURBINATE REDUCTION;  Surgeon: Jodi Marble, MD;  Location: [facility];  Service: ENT;  Laterality: Bilateral;  . NECK SURGERY  32yrs ago  . right shoulder surgery  4-43yrs ago   cyst removed  . TEE WITHOUT CARDIOVERSION  05/05/2011   Procedure: TRANSESOPHAGEAL ECHOCARDIOGRAM (TEE);  Surgeon: Lelon Perla, MD;  Location: [facility] ENDOSCOPY;  Service: Cardiovascular;  Laterality: N/A;  . TEE WITHOUT CARDIOVERSION N/A 04/21/2015   Procedure: TRANSESOPHAGEAL ECHOCARDIOGRAM (TEE);  Surgeon: Sueanne Margarita, MD;  Location: [facility] ENDOSCOPY;  Service: Cardiovascular;  Laterality: N/A;  . THROAT SURGERY  4-26yrs ago   "thought " it was cancer but came back not  . TONSILLECTOMY     Social History   Occupational History  . Occupation: Dealer   Tobacco Use  . Smoking status: Current Every Day Smoker    Packs/day: 1.00    Years: 49.00    Pack years: 49.00    Types: Cigarettes  . Smokeless tobacco: Never Used  . Tobacco comment: 1 pack per day 04/16/2020  Vaping Use  . Vaping Use: Never used  Substance and Sexual Activity  . Alcohol use: Not Currently    Alcohol/week: 0.0 standard drinks    Comment: 12/05/17 pt stated he has drinked 1/2 drink in 3 years  . Drug use: No  . Sexual activity: Yes

## 2020-07-22 NOTE — Progress Notes (Signed)
This patient returns to my office for at risk foot care.  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   Patient is taking coumadin.  This patient is unable to cut nails himself since the patient cannot reach his nails.These nails are painful walking and wearing shoes.  This patient presents for at risk foot care today. Patient states he has pain in feet due to neuropathy.  General Appearance  Alert, conversant and in no acute stress.  Vascular  Dorsalis pedis and posterior tibial  pulses are palpable  bilaterally.  Capillary return is within normal limits  bilaterally. Temperature is within normal limits  bilaterally.  Neurologic  Senn-Weinstein monofilament wire test absent  bilaterally. Muscle power within normal limits bilaterally.  Nails Thick disfigured discolored nails with subungual debris  from first   to fifth toes bilaterally. No evidence of bacterial infection or drainage bilaterally.  Orthopedic  No limitations of motion  feet .  No crepitus or effusions noted.  No bony pathology or digital deformities noted.  Skin  normotropic skin with no porokeratosis noted bilaterally.  No signs of infections or ulcers noted.     Onychomycosis  Pain in right toes  Pain in left toes  Consent was obtained for treatment procedures.   Mechanical debridement of nails 1-5  bilaterally performed with a nail nipper.  Filed with dremel without incident.    Return office visit     10 weeks                 Told patient to return for periodic foot care and evaluation due to potential at risk complications.   Gardiner Barefoot DPM

## 2020-07-23 ENCOUNTER — Ambulatory Visit
Admission: RE | Admit: 2020-07-23 | Discharge: 2020-07-23 | Disposition: A | Discharge status: Home / Self Care | Payer: 59 | Source: Ambulatory Visit | Attending: Acute Care | Admitting: Acute Care

## 2020-07-23 DIAGNOSIS — F17210 Nicotine dependence, cigarettes, uncomplicated: Secondary | ICD-10-CM

## 2020-07-29 ENCOUNTER — Telehealth: Payer: Self-pay

## 2020-07-29 NOTE — Telephone Encounter (Signed)
Pt called regarding his appt.  He would like a call back to discuss

## 2020-07-29 NOTE — Telephone Encounter (Signed)
Called pt and r/s 4/18

## 2020-07-30 ENCOUNTER — Telehealth: Payer: Self-pay

## 2020-07-30 NOTE — Progress Notes (Signed)
Please call patient and let them  know their  low dose Ct was read as a Lung RADS 1, negative study: no nodules or definitely benign nodules. Radiology recommendation is for a repeat LDCT in 12 months. .Please let them  know we will order and schedule their  annual screening scan for 07/2021. Please let them  know there was notation of CAD on their  scan.  Please remind the patient  that this is a non-gated exam therefore degree or severity of disease  cannot be determined. Please have them  follow up with their PCP regarding potential risk factor modification, dietary therapy or pharmacologic therapy if clinically indicated. Pt.  is  currently on statin therapy. Please place order for annual  screening scan for  07/2021 and fax results to PCP. Thanks so much. 

## 2020-07-30 NOTE — Telephone Encounter (Signed)
Pt called requesting assistance with his Freestyle Libre 2. For the last few days pt has been getting different readings with his CGM and his blood glucose meter. Today when checking his fasting sugars (pre breakfast and pre insulin) libre read 190 when he checked his blood sugar it was 280. After his insulin and eating freestyle read 121 and his blood sugar was 161. Pt has contacted [place] regarding his numbers and wanted to know was there anything else he could possibly do.

## 2020-07-30 NOTE — Telephone Encounter (Signed)
Called patient.  He states that the company is going to send him new sensors.  He was also recently sent a new reader but has not used this yet.  He will try this when he gets new sensors.  Discussed that the Elenor Legato does have a lag time between interstitial fluid and blood.  When the glucose is fluctuating quickly, the Elenor Legato is less accurate as well as on the first and last days of the censor.  He states that he is not hydrated as he is on fluid pills and has increased urination but also swelling of fluid at ankles frequently.  Discussed that the sensors work best when he is adequately hydrated.    Instructed patient to use finger stick blood glucose to make decisions related to treatment of blood sugar at this time. (hypoglycemia and insulin dosing).  Antonieta Iba, RD, LDN, CDCES

## 2020-08-03 ENCOUNTER — Other Ambulatory Visit: Payer: Self-pay | Admitting: *Deleted

## 2020-08-03 DIAGNOSIS — F17210 Nicotine dependence, cigarettes, uncomplicated: Secondary | ICD-10-CM

## 2020-08-07 ENCOUNTER — Encounter: Payer: Self-pay | Admitting: Endocrinology

## 2020-08-17 ENCOUNTER — Ambulatory Visit: Payer: 59 | Admitting: Physical Medicine and Rehabilitation

## 2020-08-18 ENCOUNTER — Ambulatory Visit: Payer: 59 | Admitting: Endocrinology

## 2020-08-20 ENCOUNTER — Other Ambulatory Visit: Payer: Self-pay

## 2020-08-20 ENCOUNTER — Ambulatory Visit (INDEPENDENT_AMBULATORY_CARE_PROVIDER_SITE_OTHER): Payer: 59 | Admitting: Endocrinology

## 2020-08-20 VITALS — BP 110/60 | HR 77 | Ht 75.0 in | Wt 261.4 lb

## 2020-08-20 DIAGNOSIS — N1831 Chronic kidney disease, stage 3a: Secondary | ICD-10-CM

## 2020-08-20 DIAGNOSIS — E1122 Type 2 diabetes mellitus with diabetic chronic kidney disease: Secondary | ICD-10-CM

## 2020-08-20 DIAGNOSIS — Z794 Long term (current) use of insulin: Secondary | ICD-10-CM | POA: Diagnosis not present

## 2020-08-20 LAB — POCT GLYCOSYLATED HEMOGLOBIN (HGB A1C): Hemoglobin A1C: 7.6 % — AB (ref 4.0–5.6)

## 2020-08-20 MED ORDER — TRULICITY 3 MG/0.5ML ~~LOC~~ SOAJ: 3.0000 mg | SUBCUTANEOUS | 3 refills | Status: DC

## 2020-08-20 NOTE — Progress Notes (Signed)
Subjective:    Patient ID: [name], male    DOB: Dec 12, 1955, 65 y.o.   MRN: [id number]  HPI Pt returns for f/u of diabetes mellitus:  DM type: Insulin-requiring type 2 Dx'ed: 4098 Complications: stage 3 CRI.   Therapy: insulin since 2019.   DKA: never Severe hypoglycemia: once (2021, when a meal was delayed after taking Novolog) Pancreatitis: never Pancreatic imaging: normal on 2017 CT.  SDOH: he declines insulins other than Novolog. Other: he takes multiple daily injections; he did not tolerate Jardiance (dehydration) or time-release insulins (rash); metformin dosage is limited by abd pain.   Interval history: Pt says he takes Novolog 80 units 2-3 times a day (just before each meal).  No recent steroids.  I reviewed continuous glucose monitor data.  Glucose varies from 80-250.  It is in general lowest 1AM-6AM.  It then increases to 9AM, then decreases slightly. Past Medical History:  Diagnosis Date  . Arthritis    lower back  . Asthma   . Atrial flutter ([place])    s/p CTI ablation by Dr Rayann Heman  . Brain aneurysm 2009   questionable. A follow up CTA in 2009 showed no evidence of  . Chronic back pain    DDD/stenosis  . Colon polyps    9 polyps removed 10/13/11  . Complication of anesthesia September 11, 2012   slow to awaken after ablation  . COPD (chronic obstructive pulmonary disease) ([place])   . Diabetes mellitus    takes Metformin and Glimepiride daily  . Emphysema   . GERD (gastroesophageal reflux disease)    takes Omeprazole bid  . History of shingles   . HLD (hyperlipidemia)    takes Pravastatin daily  . HTN (hypertension)    takes Prinizide daily  . Obesity   . OSA (obstructive sleep apnea)    not always using cpap  . Overdose 2009   unintentional Flecanide overdose  . Peripheral neuropathy   . Short-term memory loss   . Tobacco abuse     Past Surgical History:  Procedure Laterality Date  . ANTERIOR CERVICAL DECOMP/DISCECTOMY FUSION  01/04/2012    Procedure: ANTERIOR CERVICAL DECOMPRESSION/DISCECTOMY FUSION 1 LEVEL/HARDWARE REMOVAL;  Surgeon: Ophelia Charter, MD;  Location: [facility];  Service: Neurosurgery;  Laterality: N/A;  explore cervical fusion Cervical six - seven  with removal of codman plate anterior cervical decompression with fusion interbody prothesis plating and bonegraft  . APPENDECTOMY  2-6yrs ago  . ATRIAL FIBRILLATION ABLATION N/A 09/11/2012   PT DID NOT HAVE AN ATRIAL FIBRILLATION ABLATION IN 2014!  ATRIAL FLUTTER ABLATION ONLY  . ATRIAL FLUTTER ABLATION  09/11/2012   CTI ablation by Dr Rayann Heman  . BIOPSY  11/07/2019   Procedure: BIOPSY;  Surgeon: Milus Banister, MD;  Location: [facility];  Service: Endoscopy;;  . CARDIAC CATHETERIZATION  2008   no significant CAD  . CARDIOVERSION  05/05/2011   Procedure: CARDIOVERSION;  Surgeon: Lelon Perla, MD;  Location: [facility] ENDOSCOPY;  Service: Cardiovascular;  Laterality: N/A;  . CARDIOVERSION Bilateral 07/26/2012   Procedure: CARDIOVERSION;  Surgeon: Minus Breeding, MD;  Location: [facility] ENDOSCOPY;  Service: Cardiovascular;  Laterality: Bilateral;  . CARPAL TUNNEL RELEASE  99/2000   bilateral  . COLONOSCOPY WITH PROPOFOL N/A 12/13/2012   Procedure: COLONOSCOPY WITH PROPOFOL;  Surgeon: Milus Banister, MD;  Location: [facility] ENDOSCOPY;  Service: Endoscopy;  Laterality: N/A;  . ELECTROPHYSIOLOGIC STUDY N/A 04/21/2015   Procedure: Atrial Fibrillation Ablation;  Surgeon: Thompson Grayer, MD;  Location: [facility]  INVASIVE CV LAB;  Service: Cardiovascular;  Laterality: N/A;  . ESOPHAGOGASTRODUODENOSCOPY (EGD) WITH PROPOFOL N/A 11/07/2019   Procedure: ESOPHAGOGASTRODUODENOSCOPY (EGD) WITH PROPOFOL;  Surgeon: Milus Banister, MD;  Location: [facility] ENDOSCOPY;  Service: Endoscopy;  Laterality: N/A;  . HERNIA REPAIR    . KNEE SURGERY  6-50yrs ago   left  . LEFT HEART CATH AND CORONARY ANGIOGRAPHY N/A 09/19/2016   Procedure: Left Heart Cath and Coronary Angiography;  Surgeon: Belva Crome, MD;  Location: [facility] CV LAB;  Service: Cardiovascular;  Laterality: N/A;  . Left inguinal hernia repair     as a child  . NASAL SEPTOPLASTY W/ TURBINOPLASTY Bilateral 02/19/2014   Procedure: NASAL SEPTOPLASTY WITH BILATERAL TURBINATE REDUCTION;  Surgeon: Jodi Marble, MD;  Location: [facility];  Service: ENT;  Laterality: Bilateral;  . NECK SURGERY  34yrs ago  . right shoulder surgery  4-69yrs ago   cyst removed  . TEE WITHOUT CARDIOVERSION  05/05/2011   Procedure: TRANSESOPHAGEAL ECHOCARDIOGRAM (TEE);  Surgeon: Lelon Perla, MD;  Location: [facility] ENDOSCOPY;  Service: Cardiovascular;  Laterality: N/A;  . TEE WITHOUT CARDIOVERSION N/A 04/21/2015   Procedure: TRANSESOPHAGEAL ECHOCARDIOGRAM (TEE);  Surgeon: Sueanne Margarita, MD;  Location: [facility] ENDOSCOPY;  Service: Cardiovascular;  Laterality: N/A;  . THROAT SURGERY  4-56yrs ago   "thought " it was cancer but came back not  . TONSILLECTOMY      Social History   Socioeconomic History  . Marital status: Married    Spouse name: Not on file  . Number of children: 2  . Years of education: Not on file  . Highest education level: Not on file  Occupational History  . Occupation: Dealer   Tobacco Use  . Smoking status: Current Every Day Smoker    Packs/day: 1.00    Years: 49.00    Pack years: 49.00    Types: Cigarettes  . Smokeless tobacco: Never Used  . Tobacco comment: 1 pack per day 04/16/2020  Vaping Use  . Vaping Use: Never used  Substance and Sexual Activity  . Alcohol use: Not Currently    Alcohol/week: 0.0 standard drinks    Comment: 12/05/17 pt stated he has drinked 1/2 drink in 3 years  . Drug use: No  . Sexual activity: Yes  Other Topics Concern  . Not on file  Social History Narrative   Daily caffeine(Mountain Dew)       Lives in [place] with spouse.   Unemployed due to chronic back/ leg pain         Social Determinants of Health   Financial Resource Strain: Not on file  Food Insecurity: Not on file  Transportation Needs: Not  on file  Physical Activity: Not on file  Stress: Not on file  Social Connections: Not on file  Intimate Partner Violence: Not on file    Current Outpatient Medications on File Prior to Visit  Medication Sig Dispense Refill  . albuterol (PROAIR HFA) 108 (90 Base) MCG/ACT inhaler Inhale 1-2 puffs into the lungs every 6 (six) hours as needed for wheezing or shortness of breath. 18 g 5  . Ascorbic Acid (VITAMIN C PO) Take 500 mg by mouth daily.     [name] b complex vitamins tablet Take 1 tablet by mouth daily.     . bisoprolol (ZEBETA) 10 MG tablet Take 10 mg by mouth daily.     . cephALEXin (KEFLEX) 500 MG capsule Take 500 mg by mouth 2 (two) times daily.    [name]  Continuous Blood Gluc Receiver (FREESTYLE LIBRE 2 READER) DEVI     . Continuous Blood Gluc Sensor (FREESTYLE LIBRE 2 SENSOR) MISC 1 Device by Does not apply route every 14 (fourteen) days. 6 each 3  . diltiazem (CARDIZEM CD) 120 MG 24 hr capsule Take 120 mg by mouth daily.    [name] diltiazem (TIAZAC) 120 MG 24 hr capsule Take 1 capsule by mouth daily.     [name] doxycycline (VIBRA-TABS) 100 MG tablet Take 1 tablet (100 mg total) by mouth daily. 7 tablet 0  . gabapentin (NEURONTIN) 300 MG capsule Take 1 capsule (300 mg total) by mouth 2 (two) times daily. History of CHF and cirrhosis and renal insufficiency requires less dose. 60 capsule 3  . insulin aspart (FIASP FLEXTOUCH) 100 UNIT/ML FlexTouch Pen Inject 80 Units into the skin 2 (two) times daily with a meal. 175 mL 3  . Insulin Pen Needle (B-D UF III MINI PEN NEEDLES) 31G X 5 MM MISC 1 Device by Other route 3 (three) times daily with meals. Use three times daily to inject insulin. 300 each 3  . levocetirizine (XYZAL) 5 MG tablet Take 5 mg by mouth every evening.     [name] losartan (COZAAR) 50 MG tablet Take 1 tablet (50 mg total) by mouth daily. 90 tablet 3  . metFORMIN (GLUCOPHAGE-XR) 500 MG 24 hr tablet Take 1 tablet (500 mg total) by mouth at bedtime. 90 tablet 3  . metolazone (ZAROXOLYN) 2.5 MG  tablet TAKE 1 TABLET BY MOUTH ON TUESDAY AND THURSDAY 30 MINUTES BEFORE TAKING TORSEMIDE OR AS DIRECTED 15 tablet 3  . Multiple Vitamin (MULTIVITAMIN WITH MINERALS) TABS Take 1 tablet by mouth daily.     . Multiple Vitamins-Minerals (ZINC PO) Take 50 mg by mouth daily.     . nitroGLYCERIN (NITROSTAT) 0.4 MG SL tablet Place 1 tablet (0.4 mg total) under the tongue every 5 (five) minutes x 3 doses as needed for chest pain. 25 tablet 3  . omeprazole (PRILOSEC) 20 MG capsule Take 20 mg by mouth 2 (two) times daily.    [name] oxyCODONE-acetaminophen (PERCOCET) 10-325 MG tablet Take 1 tablet by mouth 4 (four) times daily as needed.    . pantoprazole (PROTONIX) 20 MG tablet Take 20 mg by mouth 2 (two) times daily.     . potassium chloride SA (KLOR-CON) 20 MEQ tablet TAKE 1 TABLET BY MOUTH DAILY. TAKE AN EXTRA TABLET ON TUESDAY AND THURSDAY 40 tablet 11  . pravastatin (PRAVACHOL) 80 MG tablet TAKE 1 TABLET BY MOUTH DAILY AT BEDTIME 30 tablet 10  . sildenafil (REVATIO) 20 MG tablet Take 100 mg by mouth daily as needed (erectile dysfunction).     . torsemide (DEMADEX) 20 MG tablet TAKE 2 TABLETS BY MOUTH 2 TIMES DAILY 270 tablet 3  . triamcinolone cream (KENALOG) 0.1 % Apply 1 application topically daily as needed. 453 g 2  . umeclidinium-vilanterol (ANORO ELLIPTA) 62.5-25 MCG/INH AEPB Inhale 1 puff into the lungs daily. 60 each 5  . VITAMIN A PO Take 2,400 mcg by mouth daily.     . Vitamin D, Ergocalciferol, (DRISDOL) 1.25 MG (50000 UT) CAPS capsule Take 50,000 Units by mouth every 7 (seven) days.     [name] warfarin (COUMADIN) 5 MG tablet TAKE 1 TO 1 AND 1/2 TABLETS BY MOUTH DAILY AS DIRECTED BY THE COUMADIN CLINIC 45 tablet 1   No current facility-administered medications on file prior to visit.    Allergies  Allergen Reactions  . Adhesive [Tape]  itching  . Latex Itching    When tape is on the skin too long skin gets red & itching    Family History  Problem Relation Age of Onset  . Diabetes Mother    . Heart disease Father   . Lung cancer Father   . Diabetes Maternal Grandmother   . Colon cancer Neg Hx   . Anesthesia problems Neg Hx   . Hypotension Neg Hx   . Malignant hyperthermia Neg Hx   . Pseudochol deficiency Neg Hx   . Esophageal cancer Neg Hx   . Pancreatic cancer Neg Hx   . Stomach cancer Neg Hx     BP 110/60 (BP Location: Right Arm, Patient Position: Sitting, Cuff Size: Large)   Pulse 77   Ht 6\' 3"  (1.905 m)   Wt 261 lb 6.4 oz (118.6 kg)   SpO2 94%   BMI 32.67 kg/m    Review of Systems Denies n/v    Objective:   Physical Exam VITAL SIGNS:  See vs page GENERAL: no distress Pulses: dorsalis pedis intact bilat.   MSK: no deformity of the feet CV: 1+ bilat leg edema.   Skin:  no ulcer on the feet, but the skin is dry and scaly.  normal temp on the feet.   Neuro: sensation is intact to touch on the feet, but severely decreased from normal.   Ext: there is bilateral onychomycosis of the toenails.    Lab Results  Component Value Date   HGBA1C 7.6 (A) 08/20/2020       Assessment & Plan:  Insulin-requiring type 2 DM: uncontrolled  Patient Instructions  I have sent a prescription to your pharmacy, to double the Trulicity again, and:  continue the same Novolog.  check your blood sugar twice a day.  vary the time of day when you check, between before the 3 meals, and at bedtime.  also check if you have symptoms of your blood sugar being too high or too low.  please keep a record of the readings and bring it to your next appointment here (or you can bring the meter itself).  You can write it on any piece of paper.  please call us sooner if your blood sugar goes below 70, or if you have a lot of readings over 200.   Please come back for a follow-up appointment in 3 months.

## 2020-08-20 NOTE — Patient Instructions (Addendum)
I have sent a prescription to your pharmacy, to double the Trulicity again, and:  continue the same Novolog.  check your blood sugar twice a day.  vary the time of day when you check, between before the 3 meals, and at bedtime.  also check if you have symptoms of your blood sugar being too high or too low.  please keep a record of the readings and bring it to your next appointment here (or you can bring the meter itself).  You can write it on any piece of paper.  please call us sooner if your blood sugar goes below 70, or if you have a lot of readings over 200.   Please come back for a follow-up appointment in 3 months.

## 2020-08-25 ENCOUNTER — Encounter: Payer: Self-pay | Admitting: Physical Medicine and Rehabilitation

## 2020-08-25 ENCOUNTER — Ambulatory Visit (INDEPENDENT_AMBULATORY_CARE_PROVIDER_SITE_OTHER): Payer: 59 | Admitting: Physical Medicine and Rehabilitation

## 2020-08-25 ENCOUNTER — Other Ambulatory Visit: Payer: Self-pay

## 2020-08-25 DIAGNOSIS — R202 Paresthesia of skin: Secondary | ICD-10-CM | POA: Diagnosis not present

## 2020-08-25 NOTE — Progress Notes (Signed)
[name] - 65 y.o. male MRN [id number]  Date of birth: April 07, 1956  Office Visit Note: Visit Date: 08/25/2020 PCP: [name], MD Referred by: Jessy Oto, MD  Subjective: Chief Complaint  Patient presents with  . Left Hip - Pain  . Left Foot - Pain  . Left Leg - Pain   HPI:  [name] is a 65 y.o. male who comes in today at the request of Dr. Basil Dess for electrodiagnostic study of the Left lower extremities.  Dr. Louanne Skye is mainly looking for underlying radiculopathy.  Patient reports bilateral lower extremity paresthesias with frank numbness from the knees down in a stocking distribution.  He reports not being able to feel much in his feet at all.  He carries a diagnosis of diabetic polyneuropathy.  He is a diabetic type 2 insulin-dependent.  His hemoglobin A1c averages in the mid 8 range.  He has not had prior electrodiagnostic studies.  On the left side different than the right he does get pain in the groin sometimes and he gets some referral into the leg and foot which is a little different than the right overall.  Worse with standing and ambulating.  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   ROS Otherwise per HPI.  Assessment & Plan: Visit Diagnoses:    ICD-10-CM   1. 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  He carries a diagnosis of polyneuropathy but has not had electrodiagnostic studies.  For proven full characterization of the neuropathy referral to a neurologist for a full 3 limb study could be performed.  The needle EMG findings could be consistent with a lumbar stenosis but typically the level of denervation seen in motor unit changes would be a  fairly severe stenosis and his latest imaging shows moderate narrowing.  He could be getting more pain from the spine on the left more than right but that is not confirmed or elucidated with this testing.   There is no significant electrodiagnostic evidence of any other focal nerve entrapment or lumbosacral plexopathy.   Recommendations: 1.  Follow-up with referring physician. 2.  Continue current management of symptoms.  Meds & Orders: No orders of the defined types were placed in this encounter.   Orders Placed This Encounter  Procedures  . NCV with EMG (electromyography)    Follow-up: Return in about 4 weeks (around 09/22/2020) for Basil Dess, MD.   Procedures: No procedures performed  EMG & NCV Findings: Evaluation of the left fibular motor nerve showed prolonged distal onset latency (12.2 ms) and reduced amplitude (1.9 mV).  The left tibial motor nerve showed prolonged distal onset latency (6.2 ms), reduced amplitude (0.6 mV), and decreased conduction velocity (Knee-Ankle, 31 m/s).  The left saphenous sensory nerve showed prolonged distal peak latency (8.1 ms).  The left superficial fibular sensory nerve showed no response (14 cm).  The left sural sensory nerve showed no response (Calf).    Needle evaluation of the left anterior tibialis and the left Fibularis Longus muscles showed increased insertional activity, increased spontaneous activity, and diminished recruitment.  The left medial gastrocnemius muscle showed increased insertional activity, moderately increased spontaneous activity, and diminished recruitment.  All remaining muscles (as  indicated in the following table) showed no evidence of electrical instability.    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  He carries a diagnosis of polyneuropathy but has not had  electrodiagnostic studies.  For proven full characterization of the neuropathy referral to a neurologist for a full 3 limb study could be performed.  The needle EMG findings could be consistent with a lumbar stenosis but typically the level of denervation seen in motor unit changes would be a fairly severe stenosis and his latest imaging shows moderate narrowing.  He could be getting more pain from the spine on the left more than right but that is not confirmed or elucidated with this testing.   There is no significant electrodiagnostic evidence of any other focal nerve entrapment or lumbosacral plexopathy.   Recommendations: 1.  Follow-up with referring physician. 2.  Continue current management of symptoms.  ___________________________ Laurence Spates FAAPMR Board Certified, American Board of Physical Medicine and Rehabilitation    Nerve Conduction Studies Anti Sensory Summary Table   Stim Site NR Peak (ms) Norm Peak (ms) P-T Amp (V) Norm P-T Amp Site1 Site2 Delta-P (ms) Dist (cm) Vel (m/s) Norm Vel (m/s)  Left Saphenous Anti Sensory (Ant Med Mall)  31.6C  14cm    *8.1 <4.4 3.1 >2 14cm Ant Med Mall 8.1 0.0  >32  Left Sup Fibular Anti Sensory (Ant Lat Mall)  31.5C  14 cm *NR  <4.4  >5.0 14 cm Ant Lat Mall  14.0  >32  Left Sural Anti Sensory (Lat Mall)  31.6C  Calf *NR  <4.0  >5.0 Calf Lat Mall  14.0  >35   Motor Summary Table   Stim Site NR Onset (ms) Norm Onset (ms) O-P Amp (mV) Norm O-P Amp Site1 Site2 Delta-0 (ms) Dist (cm) Vel (m/s) Norm Vel (m/s)  Left Fibular Motor (Ext Dig Brev)  30.9C  Ankle    *12.2 <6.1 *1.9 >2.5 B Fib Ankle 1.7 10.0 59 >38  B Fib    13.9  1.7         Left Tibial Motor (Abd Hall Brev)  31.4C  Ankle    *6.2 <6.1 *0.6 >3.0 Knee Ankle 13.4 42.0 *31 >35  Knee    19.6  0.8          EMG   Side Muscle Nerve Root Ins Act Fibs Psw Amp Dur Poly Recrt Int Fraser Din Comment  Left AntTibialis Dp Br Peron L4-5 *CRD *3+ *3+ Nml Nml 0 *Reduced Nml   Left Fibularis  Longus  Sup Br Peron L5-S1 *CRD *3+ *3+ Nml Nml 0 *Reduced Nml   Left MedGastroc Tibial S1-2 *Incr *2+ *2+ Nml Nml 0 *Reduced Nml   Left VastusMed Femoral L2-4 Nml Nml Nml Nml Nml 0 Nml Nml   Left BicepsFemS Sciatic L5-S1 Nml Nml Nml Nml Nml 0 Nml Nml     Nerve Conduction Studies Anti Sensory Left/Right Comparison   Stim Site L Lat (ms) R Lat (ms) L-R Lat (ms) L Amp (V) R Amp (V) L-R Amp (%) Site1 Site2 L Vel (m/s) R Vel (m/s) L-R Vel (m/s)  Saphenous Anti Sensory (Ant Med Mall)  31.6C  14cm *8.1   3.1   14cm Ant Med Mall     Sup Fibular Anti Sensory (Ant Lat Mall)  31.5C  14 cm       14 cm Ant Lat [employer]  Sural Anti Sensory (Lat Mall)  31.6C  Calf       Calf Lat Mall      Motor Left/Right Comparison   Stim Site L Lat (ms) R Lat (ms) L-R Lat (ms) L Amp (mV) R Amp (mV) L-R Amp (%) Site1 Site2 L Vel (m/s) R Vel (m/s) L-R Vel (m/s)  Fibular Motor (Ext Dig Brev)  30.9C  Ankle *12.2   *1.9   B Fib Ankle 59    B Fib 13.9   1.7         Tibial Motor (Abd Hall Brev)  31.4C  Ankle *6.2   *0.6   Knee Ankle *31    Knee 19.6   0.8            Waveforms:             Clinical History: MRI LUMBAR SPINE WITHOUT CONTRAST  TECHNIQUE: Multiplanar, multisequence MR imaging of the lumbar spine was performed. No intravenous contrast was administered.  COMPARISON:  02/06/2020 lumbar spine radiographs. 05/06/2017 MRI lumbar spine and prior.  FINDINGS: Segmentation:  Standard.  Alignment:  Straightening of lordosis.  Grade 1 L3-4 retrolisthesis.  Vertebrae: Multilevel Modic type 2 endplate degenerative changes. Multilevel Schmorl's node formation. Scattered hemangiomata versus focal fat.  Conus medullaris and cauda equina: Conus extends to the L1 level. Conus and cauda equina appear normal.  Disc levels: Multilevel desiccation disc space loss most prominent at the L3-4 and L5-S1 levels.  L1-2: Disc bulge with small central protrusion, ligamentum flavum and bilateral  facet hypertrophy. Mild spinal canal and bilateral neural foraminal narrowing.  L2-3: Disc bulge, ligamentum flavum and bilateral facet hypertrophy. Mild spinal canal, moderate right and mild left neural foraminal narrowing.  L3-4: Disc bulge abutting the ventral thecal sac with prominent subarticular components effacing the lateral recess. There is abutment of the exiting L3 and descending L4 nerve roots. Bilateral facet hypertrophy. Moderate spinal canal and bilateral neural foraminal narrowing, unchanged.  L4-5: Disc bulge and bilateral facet hypertrophy. Small left foraminal protrusion grazing the exiting L4 nerve root. Patent spinal canal. Moderate bilateral neural foraminal narrowing.  L5-S1: Bilateral pars defects. Disc bulge with superimposed central protrusion/annular fissuring. Bilateral facet hypertrophy. Patent spinal canal. Mild bilateral neural foraminal narrowing.  Paraspinal and other soft tissues: Bilateral renal cysts.  IMPRESSION: Multilevel spondylosis, grossly unchanged.  Moderate L3-4 and mild L1-3 spinal canal narrowing.  Moderate right L2-3 and bilateral L3-5 neural foraminal narrowing.  Mild bilateral L1-2, L5-S1 neural foraminal narrowing.   Electronically Signed   By: Primitivo Gauze M.D.   On: 03/01/2020 10:46     Objective:  VS:  HT:    WT:   BMI:     BP:   HR: bpm  TEMP: ( )  RESP:  Physical Exam Vitals and nursing note reviewed.  Constitutional:      General: He is not in acute distress.    Appearance: Normal appearance. He is not ill-appearing.  HENT:     Head: Normocephalic and atraumatic.     Right Ear: External ear normal.     Left Ear: External ear normal.     Nose: No congestion.  Eyes:     Extraocular Movements: Extraocular movements intact.  Cardiovascular:     Rate and Rhythm: Normal rate.     Pulses: Normal pulses.  Pulmonary:     Effort: Pulmonary effort is normal. No respiratory distress.   Abdominal:     General: There is distension.     Tenderness:  There is no abdominal tenderness. There is no guarding.  Musculoskeletal:        General: No tenderness or signs of injury.     Cervical back: Neck supple.     Right lower leg: No edema.     Left lower leg: No edema.     Comments: Patient has good distal strength without clonus.  He does have atrophy of the Bilateral intrinsic musculature of the feet.  He does have some venous stasis changes.  He does have decreased sensation to light touch bilaterally in a stocking distribution.  He has a lot of weakness with toe extension.  He has good ankle extension and dorsiflexion plantarflexion.  He has a negative slump test bilaterally he does have pain with extension of the lumbar spine.  Skin:    Findings: No erythema or rash.  Neurological:     General: No focal deficit present.     Mental Status: He is alert and oriented to person, place, and time.     Sensory: No sensory deficit.     Motor: No weakness or abnormal muscle tone.     Coordination: Coordination normal.  Psychiatric:        Mood and Affect: Mood normal.        Behavior: Behavior normal.      Imaging: No results found.

## 2020-08-25 NOTE — Progress Notes (Signed)
Pt state left leg pain, starting from hip to the groin down to his foot. Pt state walking and sitting makes the pain worse. Pt state the pain gets worse during the day.   Numeric Pain Rating Scale and Functional Assessment Average Pain 9   In the last MONTH (on 0-10 scale) has pain interfered with the following?  1. General activity like being  able to carry out your everyday physical activities such as walking, climbing stairs, carrying groceries, or moving a chair?  Rating(10)

## 2020-08-25 NOTE — Procedures (Signed)
EMG & NCV Findings: Evaluation of the left fibular motor nerve showed prolonged distal onset latency (12.2 ms) and reduced amplitude (1.9 mV).  The left tibial motor nerve showed prolonged distal onset latency (6.2 ms), reduced amplitude (0.6 mV), and decreased conduction velocity (Knee-Ankle, 31 m/s).  The left saphenous sensory nerve showed prolonged distal peak latency (8.1 ms).  The left superficial fibular sensory nerve showed no response (14 cm).  The left sural sensory nerve showed no response (Calf).    Needle evaluation of the left anterior tibialis and the left Fibularis Longus muscles showed increased insertional activity, increased spontaneous activity, and diminished recruitment.  The left medial gastrocnemius muscle showed increased insertional activity, moderately increased spontaneous activity, and diminished recruitment.  All remaining muscles (as indicated in the following table) showed no evidence of electrical instability.    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  He carries a diagnosis of polyneuropathy but has not had electrodiagnostic studies.  For proven full characterization of the neuropathy referral to a neurologist for a full 3 limb study could be performed.  The needle EMG findings could be consistent with a lumbar stenosis but typically the level of denervation seen in motor unit changes would be a fairly severe stenosis and his latest imaging shows moderate narrowing.  He could be getting more pain from the spine on the left more than right but that is not confirmed or elucidated with this testing.   There is no significant electrodiagnostic evidence of any other focal nerve entrapment or lumbosacral plexopathy.   Recommendations: 1.  Follow-up with referring physician. 2.  Continue current management of  symptoms.  ___________________________ Laurence Spates FAAPMR Board Certified, American Board of Physical Medicine and Rehabilitation    Nerve Conduction Studies Anti Sensory Summary Table   Stim Site NR Peak (ms) Norm Peak (ms) P-T Amp (V) Norm P-T Amp Site1 Site2 Delta-P (ms) Dist (cm) Vel (m/s) Norm Vel (m/s)  Left Saphenous Anti Sensory (Ant Med Mall)  31.6C  14cm    *8.1 <4.4 3.1 >2 14cm Ant Med Mall 8.1 0.0  >32  Left Sup Fibular Anti Sensory (Ant Lat Mall)  31.5C  14 cm *NR  <4.4  >5.0 14 cm Ant Lat Mall  14.0  >32  Left Sural Anti Sensory (Lat Mall)  31.6C  Calf *NR  <4.0  >5.0 Calf Lat Mall  14.0  >35   Motor Summary Table   Stim Site NR Onset (ms) Norm Onset (ms) O-P Amp (mV) Norm O-P Amp Site1 Site2 Delta-0 (ms) Dist (cm) Vel (m/s) Norm Vel (m/s)  Left Fibular Motor (Ext Dig Brev)  30.9C  Ankle    *12.2 <6.1 *1.9 >2.5 B Fib Ankle 1.7 10.0 59 >38  B Fib    13.9  1.7         Left Tibial Motor (Abd Hall Brev)  31.4C  Ankle    *6.2 <6.1 *0.6 >3.0 Knee Ankle 13.4 42.0 *31 >35  Knee    19.6  0.8          EMG   Side Muscle Nerve Root Ins Act Fibs Psw Amp Dur Poly Recrt Int Fraser Din Comment  Left AntTibialis Dp Br Peron L4-5 *CRD *3+ *3+ Nml Nml 0 *Reduced Nml   Left Fibularis Longus  Sup Br Peron L5-S1 *CRD *3+ *3+ Nml Nml 0 *Reduced Nml   Left MedGastroc Tibial S1-2 *  Incr *2+ *2+ Nml Nml 0 *Reduced Nml   Left VastusMed Femoral L2-4 Nml Nml Nml Nml Nml 0 Nml Nml   Left BicepsFemS Sciatic L5-S1 Nml Nml Nml Nml Nml 0 Nml Nml     Nerve Conduction Studies Anti Sensory Left/Right Comparison   Stim Site L Lat (ms) R Lat (ms) L-R Lat (ms) L Amp (V) R Amp (V) L-R Amp (%) Site1 Site2 L Vel (m/s) R Vel (m/s) L-R Vel (m/s)  Saphenous Anti Sensory (Ant Med Mall)  31.6C  14cm *8.1   3.1   14cm Ant Med Mall     Sup Fibular Anti Sensory (Ant Lat Mall)  31.5C  14 cm       14 cm Ant Lat Mall     Sural Anti Sensory (Lat Mall)  31.6C  Calf       Calf Lat Mall      Motor  Left/Right Comparison   Stim Site L Lat (ms) R Lat (ms) L-R Lat (ms) L Amp (mV) R Amp (mV) L-R Amp (%) Site1 Site2 L Vel (m/s) R Vel (m/s) L-R Vel (m/s)  Fibular Motor (Ext Dig Brev)  30.9C  Ankle *12.2   *1.9   B Fib Ankle 59    B Fib 13.9   1.7         Tibial Motor (Abd Hall Brev)  31.4C  Ankle *6.2   *0.6   Knee Ankle *31    Knee 19.6   0.8            Waveforms:

## 2020-08-27 ENCOUNTER — Other Ambulatory Visit: Payer: Self-pay

## 2020-08-27 ENCOUNTER — Ambulatory Visit (INDEPENDENT_AMBULATORY_CARE_PROVIDER_SITE_OTHER): Payer: 59 | Admitting: Podiatry

## 2020-08-27 DIAGNOSIS — L600 Ingrowing nail: Secondary | ICD-10-CM

## 2020-08-27 NOTE — Patient Instructions (Signed)

## 2020-08-30 NOTE — Progress Notes (Signed)
Subjective:   Patient ID: [name], male   DOB: 65 y.o.   MRN: [id number]   HPI Patient presents stating I had several nails which are so sore and I need to have them removed I cannot take the pain and I want to get this taken care of.  States his sugar has been good and his A1c has been around 7   ROS      Objective:  Physical Exam  Neurovascular status was found to be intact I did note pulses that are adequate with patient found to have a severely ingrown left hallux nail that is painful and third nail that are painful and make wearing shoe gear very difficult.  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  On blood thinner which is kept under control     Plan:  H&P reviewed the nails and the pain associated with them.  He wants them removed I did explain risk of removal and healing.  Does have long-term care diabetes understands this may impact his healing he is willing to take this risk as the trimming does not help him and he has trouble wearing shoe gear.  At this point he signed consent form I infiltrated the nailbeds with 120 mg like Marcaine mixture sterile prep done and using sterile instrumentation I remove the nails exposed matrix applied for applications of phenol followed by alcohol lavage sterile dressings gave instructions on soaks and to leave dressings on 24 hours but take them off earlier if needed and I encouraged him strongly to call with any questions concerns which may arise to.  He will be seen back to recheck and is encouraged to do daily inspections

## 2020-08-31 ENCOUNTER — Other Ambulatory Visit: Payer: Self-pay

## 2020-08-31 ENCOUNTER — Encounter: Payer: Self-pay | Admitting: Physical Medicine and Rehabilitation

## 2020-08-31 ENCOUNTER — Ambulatory Visit: Payer: Self-pay

## 2020-08-31 ENCOUNTER — Ambulatory Visit (INDEPENDENT_AMBULATORY_CARE_PROVIDER_SITE_OTHER): Payer: 59

## 2020-08-31 ENCOUNTER — Ambulatory Visit (INDEPENDENT_AMBULATORY_CARE_PROVIDER_SITE_OTHER): Payer: 59 | Admitting: Physical Medicine and Rehabilitation

## 2020-08-31 VITALS — BP 136/69 | HR 74

## 2020-08-31 DIAGNOSIS — I480 Paroxysmal atrial fibrillation: Secondary | ICD-10-CM

## 2020-08-31 DIAGNOSIS — Z7901 Long term (current) use of anticoagulants: Secondary | ICD-10-CM | POA: Diagnosis not present

## 2020-08-31 DIAGNOSIS — Z5181 Encounter for therapeutic drug level monitoring: Secondary | ICD-10-CM | POA: Diagnosis not present

## 2020-08-31 DIAGNOSIS — M48062 Spinal stenosis, lumbar region with neurogenic claudication: Secondary | ICD-10-CM | POA: Diagnosis not present

## 2020-08-31 DIAGNOSIS — M5116 Intervertebral disc disorders with radiculopathy, lumbar region: Secondary | ICD-10-CM

## 2020-08-31 DIAGNOSIS — M5416 Radiculopathy, lumbar region: Secondary | ICD-10-CM

## 2020-08-31 LAB — POCT INR: INR: 2.1 (ref 2.0–3.0)

## 2020-08-31 MED ORDER — DEXAMETHASONE SODIUM PHOSPHATE 10 MG/ML IJ SOLN
15.0000 mg | Freq: Once | INTRAMUSCULAR | Status: AC
  Administered 2020-08-31: 15 mg

## 2020-08-31 NOTE — Patient Instructions (Signed)
continue taking 1 tablet daily except for 1/2 tablet every Monday, Wednesday and Friday. Repeat INR  in 6 weeks.

## 2020-08-31 NOTE — Procedures (Signed)
Lumbosacral Transforaminal Epidural Steroid Injection - Sub-Pedicular Approach with Fluoroscopic Guidance  Patient: [name]      Date of Birth: Jun 23, 1955 MRN: [id number] PCP: [name], MD      Visit Date: 08/31/2020   Universal Protocol:    Date/Time: 08/31/2020  Consent Given By: the patient  Position: PRONE  Additional Comments: Vital signs were monitored before and after the procedure. Patient was prepped and draped in the usual sterile fashion. The correct patient, procedure, and site was verified.   Injection Procedure Details:   Procedure diagnoses: Lumbar radiculopathy [M54.16]    Meds Administered:  Meds ordered this encounter  Medications  . dexamethasone (DECADRON) injection 15 mg    Laterality: Left  Location/Site:  L3-L4  Needle:5.0 in., 22 ga.  Short bevel or Quincke spinal needle  Needle Placement: Transforaminal  Findings:    -Comments: Excellent flow of contrast along the nerve, nerve root and into the epidural space. At the time of contrast delivery he was feeling a burning sensation consistent with L3 dermatome as expected but he wanted to stop the procedure at that point. No steroid was injected.  Procedure Details: After squaring off the end-plates to get a true AP view, the C-arm was positioned so that an oblique view of the foramen as noted above was visualized. The target area is just inferior to the "nose of the scotty dog" or sub pedicular. The soft tissues overlying this structure were infiltrated with 2-3 ml. of 1% Lidocaine without Epinephrine.  The spinal needle was inserted toward the target using a "trajectory" view along the fluoroscope beam.  Under AP and lateral visualization, the needle was advanced so it did not puncture dura and was located close the 6 O'Clock position of the pedical in AP tracterory. Biplanar projections were used to confirm position. Aspiration was confirmed to be negative for CSF and/or blood. A 1-2 ml.  volume of Isovue-250 was injected and flow of contrast was noted at each level. Radiographs were obtained for documentation purposes.   After attaining the desired flow of contrast documented above, he felt a burning sensation which is not unexpected but did not want to proceed further so injection was halted at that point.  Additional Comments:  The patient tolerated the procedure well Dressing: 2 x 2 sterile gauze and Band-Aid    Post-procedure details: Patient was observed during the procedure. Post-procedure instructions were reviewed.  Patient left the clinic in stable condition.

## 2020-08-31 NOTE — Progress Notes (Signed)
Pt state lower back that travels down his left leg pain. Pt state walking and standing makes the pain worse. Pt state he takes pain meds to help ease his pain. Pt has hx of inj on 05/25/20 pt state it helped for four days.  Numeric Pain Rating Scale and Functional Assessment Average Pain 8   In the last MONTH (on 0-10 scale) has pain interfered with the following?  1. General activity like being  able to carry out your everyday physical activities such as walking, climbing stairs, carrying groceries, or moving a chair?  Rating(10)   +Driver, -BT, -Dye Allergies.

## 2020-08-31 NOTE — Patient Instructions (Signed)

## 2020-09-03 ENCOUNTER — Telehealth: Payer: Self-pay | Admitting: Endocrinology

## 2020-09-03 DIAGNOSIS — N1831 Chronic kidney disease, stage 3a: Secondary | ICD-10-CM

## 2020-09-03 DIAGNOSIS — Z794 Long term (current) use of insulin: Secondary | ICD-10-CM

## 2020-09-03 NOTE — Telephone Encounter (Signed)
New message    Pt c/o medication issue:  1. Name of Medication: Dulaglutide (TRULICITY) 3 TX/7.7SF SOPN   2. How are you currently taking this medication (dosage and times per day)? Once a week    3. Are you having a reaction (difficulty breathing--STAT)? No   4. What is your medication issue? Wants to go back on 1.5 mg too hard on stomach.   5.  Pharmacy [facility] [name]. Please advise on increase of needles

## 2020-09-04 ENCOUNTER — Ambulatory Visit (INDEPENDENT_AMBULATORY_CARE_PROVIDER_SITE_OTHER): Payer: 59 | Admitting: Podiatry

## 2020-09-04 ENCOUNTER — Other Ambulatory Visit: Payer: Self-pay

## 2020-09-04 DIAGNOSIS — B351 Tinea unguium: Secondary | ICD-10-CM | POA: Diagnosis not present

## 2020-09-04 DIAGNOSIS — L600 Ingrowing nail: Secondary | ICD-10-CM

## 2020-09-04 DIAGNOSIS — M79676 Pain in unspecified toe(s): Secondary | ICD-10-CM

## 2020-09-04 MED ORDER — CEPHALEXIN 500 MG PO CAPS: 500.0000 mg | ORAL_CAPSULE | Freq: Two times a day (BID) | ORAL | 0 refills | Status: DC

## 2020-09-04 MED ORDER — TRULICITY 1.5 MG/0.5ML ~~LOC~~ SOAJ: SUBCUTANEOUS | 1 refills | Status: DC

## 2020-09-04 NOTE — Telephone Encounter (Signed)
Please advise 

## 2020-09-04 NOTE — Progress Notes (Signed)
  Subjective:  Patient ID: [name], male    DOB: Aug 23, 1955,  MRN: [id number]  Chief Complaint  Patient presents with  . Foot Problem    Reporting possible infection in left fourth toe starting this past wednesday- nail looks damaged. Reports redness/swelling/blood,clear,puss drainage/slightly warmer to touch. Denies any f/c/n/v   65 y.o. male presents for follow up of nail procedure. History confirmed with patient.   Objective:  Physical Exam: Ingrown nail avulsion site: overlying soft crust, no warmth, no drainage and no erythema Assessment:   1. Ingrown nail   2. Pain due to onychomycosis of toenail    Plan:  Patient was evaluated and treated and all questions answered.  S/p Ingrown Toenail Excision, bilateral -Healing well considering risk factors, will rx Keflex as ppx for hx of swelling and redness even though this is doing better today. -Discussed return precautions. -F/u PRN

## 2020-09-04 NOTE — Telephone Encounter (Signed)
Rx sent to preferred pharmacy for Trulicity 1.5MG / 0.5ML

## 2020-09-04 NOTE — Telephone Encounter (Signed)
Okay to switch back to 1.5 mg Trulicity weekly

## 2020-09-07 NOTE — Progress Notes (Signed)
[name] - 66 y.o. male MRN [id number]  Date of birth: [birth year]  Office Visit Note: Visit Date: 08/31/2020 PCP: [name], MD Referred by: Jessy Oto, MD  Subjective: Chief Complaint  Patient presents with  . Lower Back - Pain  . Left Leg - Pain   HPI:  [name] is a 65 y.o. male who comes in today at the request of Dr. Basil Dess for planned Left L3-L4 Lumbar epidural steroid injection with fluoroscopic guidance.  The patient has failed conservative care including home exercise, medications, time and activity modification.  This injection will be diagnostic and hopefully therapeutic.  Please see requesting physician notes for further details and justification.   ROS Otherwise per HPI.  Assessment & Plan: Visit Diagnoses:    ICD-10-CM   1. Lumbar radiculopathy  M54.16 XR C-ARM NO REPORT    Epidural Steroid injection    dexamethasone (DECADRON) injection 15 mg  2. Radiculopathy due to lumbar intervertebral disc disorder  M51.16 XR C-ARM NO REPORT    Epidural Steroid injection    dexamethasone (DECADRON) injection 15 mg  3. Spinal stenosis of lumbar region with neurogenic claudication  M48.062 XR C-ARM NO REPORT    Epidural Steroid injection    dexamethasone (DECADRON) injection 15 mg    Plan: No additional findings.   Meds & Orders:  Meds ordered this encounter  Medications  . dexamethasone (DECADRON) injection 15 mg    Orders Placed This Encounter  Procedures  . XR C-ARM NO REPORT  . Epidural Steroid injection    Follow-up: Return for Basil Dess, MD as scheduled.   Procedures: No procedures performed  Lumbosacral Transforaminal Epidural Steroid Injection - Sub-Pedicular Approach with Fluoroscopic Guidance  Patient: [name]      Date of Birth: Apr 28, 1956 MRN: [id number] PCP: [name], MD      Visit Date: 08/31/2020   Universal Protocol:    Date/Time: 08/31/2020  Consent Given By: the patient  Position: PRONE  Additional  Comments: Vital signs were monitored before and after the procedure. Patient was prepped and draped in the usual sterile fashion. The correct patient, procedure, and site was verified.   Injection Procedure Details:   Procedure diagnoses: Lumbar radiculopathy [M54.16]    Meds Administered:  Meds ordered this encounter  Medications  . dexamethasone (DECADRON) injection 15 mg    Laterality: Left  Location/Site:  L3-L4  Needle:5.0 in., 22 ga.  Short bevel or Quincke spinal needle  Needle Placement: Transforaminal  Findings:    -Comments: Excellent flow of contrast along the nerve, nerve root and into the epidural space. At the time of contrast delivery he was feeling a burning sensation consistent with L3 dermatome as expected but he wanted to stop the procedure at that point. No steroid was injected.  Procedure Details: After squaring off the end-plates to get a true AP view, the C-arm was positioned so that an oblique view of the foramen as noted above was visualized. The target area is just inferior to the "nose of the scotty dog" or sub pedicular. The soft tissues overlying this structure were infiltrated with 2-3 ml. of 1% Lidocaine without Epinephrine.  The spinal needle was inserted toward the target using a "trajectory" view along the fluoroscope beam.  Under AP and lateral visualization, the needle was advanced so it did not puncture dura and was located close the 6 O'Clock position of the pedical in AP tracterory. Biplanar projections were used to confirm position. Aspiration was  confirmed to be negative for CSF and/or blood. A 1-2 ml. volume of Isovue-250 was injected and flow of contrast was noted at each level. Radiographs were obtained for documentation purposes.   After attaining the desired flow of contrast documented above, he felt a burning sensation which is not unexpected but did not want to proceed further so injection was halted at that point.  Additional  Comments:  The patient tolerated the procedure well Dressing: 2 x 2 sterile gauze and Band-Aid    Post-procedure details: Patient was observed during the procedure. Post-procedure instructions were reviewed.  Patient left the clinic in stable condition.      Clinical History: MRI LUMBAR SPINE WITHOUT CONTRAST  TECHNIQUE: Multiplanar, multisequence MR imaging of the lumbar spine was performed. No intravenous contrast was administered.  COMPARISON:  02/06/2020 lumbar spine radiographs. 05/06/2017 MRI lumbar spine and prior.  FINDINGS: Segmentation:  Standard.  Alignment:  Straightening of lordosis.  Grade 1 L3-4 retrolisthesis.  Vertebrae: Multilevel Modic type 2 endplate degenerative changes. Multilevel Schmorl's node formation. Scattered hemangiomata versus focal fat.  Conus medullaris and cauda equina: Conus extends to the L1 level. Conus and cauda equina appear normal.  Disc levels: Multilevel desiccation disc space loss most prominent at the L3-4 and L5-S1 levels.  L1-2: Disc bulge with small central protrusion, ligamentum flavum and bilateral facet hypertrophy. Mild spinal canal and bilateral neural foraminal narrowing.  L2-3: Disc bulge, ligamentum flavum and bilateral facet hypertrophy. Mild spinal canal, moderate right and mild left neural foraminal narrowing.  L3-4: Disc bulge abutting the ventral thecal sac with prominent subarticular components effacing the lateral recess. There is abutment of the exiting L3 and descending L4 nerve roots. Bilateral facet hypertrophy. Moderate spinal canal and bilateral neural foraminal narrowing, unchanged.  L4-5: Disc bulge and bilateral facet hypertrophy. Small left foraminal protrusion grazing the exiting L4 nerve root. Patent spinal canal. Moderate bilateral neural foraminal narrowing.  L5-S1: Bilateral pars defects. Disc bulge with superimposed central protrusion/annular fissuring. Bilateral facet  hypertrophy. Patent spinal canal. Mild bilateral neural foraminal narrowing.  Paraspinal and other soft tissues: Bilateral renal cysts.  IMPRESSION: Multilevel spondylosis, grossly unchanged.  Moderate L3-4 and mild L1-3 spinal canal narrowing.  Moderate right L2-3 and bilateral L3-5 neural foraminal narrowing.  Mild bilateral L1-2, L5-S1 neural foraminal narrowing.   Electronically Signed   By: Primitivo Gauze M.D.   On: 03/01/2020 10:46     Objective:  VS:  HT:    WT:   BMI:     BP:136/69  HR:74bpm  TEMP: ( )  RESP:  Physical Exam Vitals and nursing note reviewed.  Constitutional:      General: He is not in acute distress.    Appearance: Normal appearance. He is obese. He is not ill-appearing.  HENT:     Head: Normocephalic and atraumatic.     Right Ear: External ear normal.     Left Ear: External ear normal.     Nose: No congestion.  Eyes:     Extraocular Movements: Extraocular movements intact.  Cardiovascular:     Rate and Rhythm: Normal rate.     Pulses: Normal pulses.  Pulmonary:     Effort: Pulmonary effort is normal. No respiratory distress.  Abdominal:     General: There is no distension.     Palpations: Abdomen is soft.  Musculoskeletal:        General: No tenderness or signs of injury.     Cervical back: Neck supple.     Right lower  leg: No edema.     Left lower leg: No edema.     Comments: Patient has good distal strength without clonus.  Skin:    Findings: No erythema or rash.  Neurological:     General: No focal deficit present.     Mental Status: He is alert and oriented to person, place, and time.     Sensory: No sensory deficit.     Motor: No weakness or abnormal muscle tone.     Coordination: Coordination normal.  Psychiatric:        Mood and Affect: Mood normal.        Behavior: Behavior normal.      Imaging: No results found.

## 2020-09-14 ENCOUNTER — Other Ambulatory Visit: Payer: Self-pay | Admitting: Cardiology

## 2020-09-14 ENCOUNTER — Telehealth: Payer: Self-pay | Admitting: Cardiology

## 2020-09-14 NOTE — Telephone Encounter (Addendum)
Spoke to pt who report swelling in both feet and hands. He also report 6 lb weight gain in 1 week and SOB only with carrying groceries in the house.   Pt state he is taking torsemide 40 mg BID and metolazone 2.5 mg twice a week.   Appointment scheduled for 5/4 at 1:15 pm with [name], PA for further evaluations. Pt also made aware of ED precaution should any new symptoms develop or worsen.   Will forward to Dr. Warren Lacy for any further recommendations.

## 2020-09-14 NOTE — Telephone Encounter (Signed)
Pt c/o swelling: STAT is pt has developed SOB within 24 hours  1) How much weight have you gained and in what time span? roughly 6 lbs in a week   2) If swelling, where is the swelling located? Feet, yesterday was in his hands and feet.   3) Are you currently taking a fluid pill? Yes  4) Are you currently SOB? States a tiny bit, cannot hear over phone pt also has COPD   5) Do you have a log of your daily weights (if so, list)? No   6) Have you gained 3 pounds in a day or 5 pounds in a week? Yes   7) Have you traveled recently? No  Began Friday. Please advise.

## 2020-09-16 ENCOUNTER — Other Ambulatory Visit: Payer: Self-pay

## 2020-09-16 ENCOUNTER — Ambulatory Visit (INDEPENDENT_AMBULATORY_CARE_PROVIDER_SITE_OTHER): Payer: 59 | Admitting: Physician Assistant

## 2020-09-16 DIAGNOSIS — E119 Type 2 diabetes mellitus without complications: Secondary | ICD-10-CM

## 2020-09-16 DIAGNOSIS — E785 Hyperlipidemia, unspecified: Secondary | ICD-10-CM

## 2020-09-16 DIAGNOSIS — R609 Edema, unspecified: Secondary | ICD-10-CM | POA: Diagnosis not present

## 2020-09-16 DIAGNOSIS — I480 Paroxysmal atrial fibrillation: Secondary | ICD-10-CM

## 2020-09-16 DIAGNOSIS — I10 Essential (primary) hypertension: Secondary | ICD-10-CM

## 2020-09-16 DIAGNOSIS — Z79899 Other long term (current) drug therapy: Secondary | ICD-10-CM | POA: Diagnosis not present

## 2020-09-16 NOTE — Patient Instructions (Signed)
Medication Instructions:   May take an extra 1/2 (half) tablet of Torsemide for swelling of your legs   *If you need a refill on your cardiac medications before your next appointment, please call your pharmacy*  Lab Work: Your physician recommends that you return for lab work TODAY:   BMET If you have labs (blood work) drawn today and your tests are completely normal, you will receive your results only by: [name] MyChart Message (if you have MyChart) OR . A paper copy in the mail If you have any lab test that is abnormal or we need to change your treatment, we will call you to review the results.  Testing/Procedures: NONE ordered at this time of appointment   Follow-Up: At [facility], you and your health needs are our priority.  As part of our continuing mission to provide you with exceptional heart care, we have created designated Provider Care Teams.  These Care Teams include your primary Cardiologist (physician) and Advanced Practice Providers (APPs -  Physician Assistants and Nurse Practitioners) who all work together to provide you with the care you need, when you need it.  Your next appointment:   5 month(s)  The format for your next appointment:   In Person  Provider:   [name], PA-C  Other Instructions

## 2020-09-16 NOTE — Progress Notes (Signed)
Cardiology Office Note:    Date:  09/18/2020   ID:  [name], DOB Jul 01, 1955, MRN [id number]  PCP:  [name], MD   [place] Prov[facility] Cardiologist:  Minus Breeding, MD {  Referring MD: [name], MD   Chief Complaint  Patient presents with  . Follow-up    Seen for Dr. Percival Spanish    History of Present Illness:    [name] is a 65 y.o. male with a hx of COPD/emphysema, HTN, HLD, DM II, obesity, OSA intolerant of CPAP, history of tobacco abuse and history of atrial fibrillation and atrial flutter. Patient had atrial flutter ablation in 2014. Since he has both paroxysmal atrial fibrillation and atrial flutter, he underwent EP study with a second ablation on 04/21/2015. Cardiac catheterization in May 2018 showed widely patent coronary arteries with normal EF, moderate LVH. Echocardiogram obtained on 08/14/2019 showed EF 60 to 65%, moderate LVH, grade 2 DD, moderately elevated pulmonary artery systolic pressure, RVSP 0000000 mmHg, severe left atrial enlargement, moderate right atrial enlargement, mild MR, mild to moderate TR. Carotid Doppler obtained on 09/30/2019 showed 1 to 39% disease in bilateral internal carotid artery. Cardiac MRI performed on 12/16/2019 showed EF 66%, moderate concentric LVH, no myocardial LGE to suggest prior MI, infiltrative cardiac disease or myocarditis. No evidence of cardiac amyloidosis.  He unfortunately had a quite significant leg pain after cardiac MRI.  I last saw the patient in September 2021, I felt his pain is most consistent with myositis versus compressed nerve in the lower back.  Total CK was negative which argues against myositis.  Vascular studies was also negative as well.  More recently, patient contacted cardiology service on 09/14/2020 complaining of swelling in both feet and hand and also 6 pound weight gain in 1 week with shortness of breath.  Patient presents today for follow-up, he says he has been having intermittent edema, however on  exam today he has no edema in the upper or lower extremity.  He says legs was quite swollen last weekend.  I recommend him to take extra half a tablet of torsemide on an as-needed basis based on the degree of swelling.  Otherwise, he can continue on torsemide 40 mg twice a day.  He denies any recent chest discomfort.  He can follow-up in 6 months.     Past Medical History:  Diagnosis Date  . Arthritis    lower back  . Asthma   . Atrial flutter ([place])    s/p CTI ablation by Dr Rayann Heman  . Brain aneurysm 2009   questionable. A follow up CTA in 2009 showed no evidence of  . Chronic back pain    DDD/stenosis  . Colon polyps    9 polyps removed 10/13/11  . Complication of anesthesia September 11, 2012   slow to awaken after ablation  . COPD (chronic obstructive pulmonary disease) ([place])   . Diabetes mellitus    takes Metformin and Glimepiride daily  . Emphysema   . GERD (gastroesophageal reflux disease)    takes Omeprazole bid  . History of shingles   . HLD (hyperlipidemia)    takes Pravastatin daily  . HTN (hypertension)    takes Prinizide daily  . Obesity   . OSA (obstructive sleep apnea)    not always using cpap  . Overdose 2009   unintentional Flecanide overdose  . Peripheral neuropathy   . Short-term memory loss   . Tobacco abuse     Past Surgical History:  Procedure Laterality  Date  . ANTERIOR CERVICAL DECOMP/DISCECTOMY FUSION  01/04/2012   Procedure: ANTERIOR CERVICAL DECOMPRESSION/DISCECTOMY FUSION 1 LEVEL/HARDWARE REMOVAL;  Surgeon: Ophelia Charter, MD;  Location: [facility] NEURO ORS;  Service: Neurosurgery;  Laterality: N/A;  explore cervical fusion Cervical six - seven  with removal of codman plate anterior cervical decompression with fusion interbody prothesis plating and bonegraft  . APPENDECTOMY  2-62yrs ago  . ATRIAL FIBRILLATION ABLATION N/A 09/11/2012   PT DID NOT HAVE AN ATRIAL FIBRILLATION ABLATION IN 2014!  ATRIAL FLUTTER ABLATION ONLY  . ATRIAL FLUTTER ABLATION   09/11/2012   CTI ablation by Dr Rayann Heman  . BIOPSY  11/07/2019   Procedure: BIOPSY;  Surgeon: Milus Banister, MD;  Location: [facility] ENDOSCOPY;  Service: Endoscopy;;  . CARDIAC CATHETERIZATION  2008   no significant CAD  . CARDIOVERSION  05/05/2011   Procedure: CARDIOVERSION;  Surgeon: Lelon Perla, MD;  Location[facility];  Service: Cardiovascular;  Laterality: N/A;  . CARDIOVERSION Bilateral 07/26/2012   Procedure: CARDIOVERSION;  Surgeon: Minus Breeding, MD;  Location: [facility]COPY;  Service: Cardiovascular;  Laterality: Bilateral;  . CARPAL TUNNEL RELEASE  99/2000   bilateral  . COLONOSCOPY WITH PROPOFOL N/A 12/13/2012   Procedure: COLONOSCOPY WITH PROPOFOL;  Surgeon: Milus Banister, MD;  Location: [facility] ENDOSCOPY;  Service: Endoscopy;  Laterality: N/A;  . ELECTROPHYSIOLOGIC STUDY N/A 04/21/2015   Procedure: Atrial Fibrillation Ablation;  Surgeon: Thompson Grayer, MD;  Location: [facility] CV LAB;  Service: Cardiovascular;  Laterality: N/A;  . ESOPHAGOGASTRODUODENOSCOPY (EGD) WITH PROPOFOL N/A 11/07/2019   Procedure: ESOPHAGOGASTRODUODENOSCOPY (EGD) WITH PROPOFOL;  Surgeon: Milus Banister, MD;  Location: [facility] ENDOSCOPY;  Service: Endoscopy;  Laterality: N/A;  . HERNIA REPAIR    . KNEE SURGERY  6-69yrs ago   left  . LEFT HEART CATH AND CORONARY ANGIOGRAPHY N/A 09/19/2016   Procedure: Left Heart Cath and Coronary Angiography;  Surgeon: Belva Crome, MD;  Location: [place] CV LAB;  Service: Cardiovascular;  Laterality: N/A;  . Left inguinal hernia repair     as a child  . NASAL SEPTOPLASTY W/ TURBINOPLASTY Bilateral 02/19/2014   Procedure: NASAL SEPTOPLASTY WITH BILATERAL TURBINATE REDUCTION;  Surgeon: Jodi Marble, MD;  Location: [facility];  Service: ENT;  Laterality: Bilateral;  . NECK SURGERY  2yrs ago  . right shoulder surgery  4-66yrs ago   cyst removed  . TEE WITHOUT CARDIOVERSION  05/05/2011   Procedure: TRANSESOPHAGEAL ECHOCARDIOGRAM (TEE);  Surgeon: Lelon Perla, MD;  Location:  [facility] ENDOSCOPY;  Service: Cardiovascular;  Laterality: N/A;  . TEE WITHOUT CARDIOVERSION N/A 04/21/2015   Procedure: TRANSESOPHAGEAL ECHOCARDIOGRAM (TEE);  Surgeon: Sueanne Margarita, MD;  Location: [facility] ENDOSCOPY;  Service: Cardiovascular;  Laterality: N/A;  . THROAT SURGERY  4-11yrs ago   "thought " it was cancer but came back not  . TONSILLECTOMY      Current Medications: Current Meds  Medication Sig  . albuterol (PROAIR HFA) 108 (90 Base) MCG/ACT inhaler Inhale 1-2 puffs into the lungs every 6 (six) hours as needed for wheezing or shortness of breath.  . Ascorbic Acid (VITAMIN C PO) Take 500 mg by mouth daily.   [name] b complex vitamins tablet Take 1 tablet by mouth daily.   . bisoprolol (ZEBETA) 10 MG tablet Take 10 mg by mouth daily.   . Continuous Blood Gluc Receiver (FREESTYLE LIBRE 2 READER) DEVI   . Continuous Blood Gluc Sensor (FREESTYLE LIBRE 2 SENSOR) MISC 1 Device by Does not apply route every 14 (fourteen) days.  [name]  diltiazem (CARDIZEM CD) 120 MG 24 hr capsule Take 120 mg by mouth daily.  . Dulaglutide (TRULICITY) 1.5 0000000 SOPN Inject 1.5 mg into the skin weekly  . gabapentin (NEURONTIN) 300 MG capsule Take 300 mg by mouth 2 (two) times daily. Patient takes 1 tablet in morning and 2 tablets in evening  . insulin aspart (NOVOLOG) 100 UNIT/ML FlexPen Inject 80 Units into the skin 3 (three) times daily with meals.  . Insulin Pen Needle (B-D UF III MINI PEN NEEDLES) 31G X 5 MM MISC 1 Device by Other route 3 (three) times daily with meals. Use three times daily to inject insulin.  [name] losartan (COZAAR) 50 MG tablet Take 1 tablet (50 mg total) by mouth daily.  . metFORMIN (GLUCOPHAGE-XR) 500 MG 24 hr tablet Take 1 tablet (500 mg total) by mouth at bedtime.  . metolazone (ZAROXOLYN) 2.5 MG tablet TAKE 1 TABLET BY MOUTH ON TUESDAY AND THURSDAY 30 MINUTES BEFORE TAKING TORSEMIDE OR AS DIRECTED  . Multiple Vitamin (MULTIVITAMIN WITH MINERALS) TABS Take 1 tablet by mouth daily.   . Multiple  Vitamins-Minerals (ZINC PO) Take 50 mg by mouth daily.   [name] omeprazole (PRILOSEC) 20 MG capsule Take 20 mg by mouth 2 (two) times daily.  [name] oxyCODONE-acetaminophen (PERCOCET) 10-325 MG tablet Take 1 tablet by mouth 4 (four) times daily as needed.  . potassium chloride SA (KLOR-CON) 20 MEQ tablet TAKE 1 TABLET BY MOUTH DAILY. TAKE AN EXTRA TABLET ON TUESDAY AND THURSDAY  . pravastatin (PRAVACHOL) 80 MG tablet TAKE 1 TABLET BY MOUTH DAILY AT BEDTIME  . torsemide (DEMADEX) 20 MG tablet TAKE 2 TABLETS BY MOUTH 2 TIMES DAILY  . triamcinolone cream (KENALOG) 0.1 % Apply 1 application topically daily as needed.  . umeclidinium-vilanterol (ANORO ELLIPTA) 62.5-25 MCG/INH AEPB Inhale 1 puff into the lungs daily.  [name] VITAMIN A PO Take 2,400 mcg by mouth daily.   . Vitamin D, Ergocalciferol, (DRISDOL) 1.25 MG (50000 UT) CAPS capsule Take 50,000 Units by mouth every 7 (seven) days.   [name] warfarin (COUMADIN) 5 MG tablet TAKE 1 TO 1 AND 1/2 TABLETS BY MOUTH DAILY AS DIRECTED BY THE COUMADIN CLINIC     Allergies:   Adhesive [tape] and Latex   Social History   Socioeconomic History  . Marital status: Married    Spouse name: Not on file  . Number of children: 2  . Years of education: Not on file  . Highest education level: Not on file  Occupational History  . Occupation: Dealer   Tobacco Use  . Smoking status: Current Every Day Smoker    Packs/day: 1.00    Years: 49.00    Pack years: 49.00    Types: Cigarettes  . Smokeless tobacco: Never Used  . Tobacco comment: 1 pack per day 04/16/2020  Vaping Use  . Vaping Use: Never used  Substance and Sexual Activity  . Alcohol use: Not Currently    Alcohol/week: 0.0 standard drinks    Comment: 12/05/17 pt stated he has drinked 1/2 drink in 3 years  . Drug use: No  . Sexual activity: Yes  Other Topics Concern  . Not on file  Social History Narrative   Daily caffeine(Mountain Dew)       Lives in [place] with spouse.   Unemployed due to chronic  back/ leg pain         Social Determinants of Health   Financial Resource Strain: Not on file  Food Insecurity: Not on file  Transportation Needs:  Not on file  Physical Activity: Not on file  Stress: Not on file  Social Connections: Not on file     Family History: The patient's family history includes Diabetes in his maternal grandmother and mother; Heart disease in his father; Lung cancer in his father. There is no history of Colon cancer, Anesthesia problems, Hypotension, Malignant hyperthermia, Pseudochol deficiency, Esophageal cancer, Pancreatic cancer, or Stomach cancer.  ROS:   Please see the history of present illness.     All other systems reviewed and are negative.  EKGs/Labs/Other Studies Reviewed:    The following studies were reviewed today:  Echo 08/14/2019 1. Left ventricular ejection fraction, by estimation, is 60 to 65%. The  left ventricle has normal function. The left ventricle has no regional  wall motion abnormalities. There is moderate concentric left ventricular  hypertrophy. Left ventricular  diastolic parameters are consistent with Grade II diastolic dysfunction  (pseudonormalization). Elevated left atrial pressure.  2. Right ventricular systolic function is normal. The right ventricular  size is normal. There is moderately elevated pulmonary artery systolic  pressure. The estimated right ventricular systolic pressure is 16.1 mmHg.  3. Left atrial size was severely dilated.  4. Right atrial size was moderately dilated.  5. The mitral valve is normal in structure. Mild mitral valve  regurgitation. No evidence of mitral stenosis.  6. Tricuspid valve regurgitation is mild to moderate.  7. The aortic valve is tricuspid. Aortic valve regurgitation is not  visualized. Mild to moderate aortic valve sclerosis/calcification is  present, without any evidence of aortic stenosis.  8. The inferior vena cava is dilated in size with >50% respiratory   variability, suggesting right atrial pressure of 8 mmHg.   EKG:  EKG is not ordered today.    Recent Labs: 04/07/2020: ALT 25 05/07/2020: Hemoglobin 10.8; Platelets 141 09/16/2020: BUN 21; Creatinine, Ser 1.17; Potassium 3.5; Sodium 142  Recent Lipid Panel    Component Value Date/Time   CHOL 122 08/18/2017 0910   TRIG 400 (H) 08/18/2017 0910   HDL 29 (L) 08/18/2017 0910   CHOLHDL 4.2 08/18/2017 0910   CHOLHDL 5.3 08/27/2016 0235   VLDL 66 (H) 08/27/2016 0235   LDLCALC 13 08/18/2017 0910   LDLDIRECT 99.9 09/08/2011 0843     Risk Assessment/Calculations:    CHA2DS2-VASc Score = 2  This indicates a 2.2% annual risk of stroke. The patient's score is based upon: CHF History: No HTN History: Yes Diabetes History: Yes Stroke History: No Vascular Disease History: No Age Score: 0 Gender Score: 0      Physical Exam:    VS:  There were no vitals taken for this visit.    Wt Readings from Last 3 Encounters:  08/20/20 261 lb 6.4 oz (118.6 kg)  07/22/20 269 lb (122 kg)  06/17/20 269 lb 3.2 oz (122.1 kg)     GEN:  Well nourished, well developed in no acute distress HEENT: Normal NECK: No JVD; No carotid bruits LYMPHATICS: No lymphadenopathy CARDIAC: RRR, no murmurs, rubs, gallops RESPIRATORY:  Clear to auscultation without rales, wheezing or rhonchi  ABDOMEN: Soft, non-tender, non-distended MUSCULOSKELETAL:  No edema; No deformity  SKIN: Warm and dry NEUROLOGIC:  Alert and oriented x 3 PSYCHIATRIC:  Normal affect   ASSESSMENT:    1. Swelling   2. Medication management   3. Primary hypertension   4. Hyperlipidemia LDL goal <70   5. Controlled type 2 diabetes mellitus without complication, without long-term current use of insulin ([place])   6. PAF (paroxysmal atrial  fibrillation) ([place])    PLAN:    In order of problems listed above:  1. Swelling: He has complained of intermittent swelling.  On exam today, he appears to be euvolemic.  I would recommend continue on 40  mg twice a day of torsemide with extra half a tablet on an as-needed basis for extra swelling.  We discussed salt and fluid restriction  2. Hypertension: Continue on current therapy  3. Hyperlipidemia: On pravastatin  4. DM2: Managed by primary care provider  5. PAF: Continue Coumadin, diltiazem and bisoprolol        Medication Adjustments/Labs and Tests Ordered: Current medicines are reviewed at length with the patient today.  Concerns regarding medicines are outlined above.  Orders Placed This Encounter  Procedures  . Basic metabolic panel   No orders of the defined types were placed in this encounter.   Patient Instructions  Medication Instructions:   May take an extra 1/2 (half) tablet of Torsemide for swelling of your legs   *If you need a refill on your cardiac medications before your next appointment, please call your pharmacy*  Lab Work: Your physician recommends that you return for lab work TODAY:   BMET If you have labs (blood work) drawn today and your tests are completely normal, you will receive your results only by: [name] MyChart Message (if you have MyChart) OR . A paper copy in the mail If you have any lab test that is abnormal or we need to change your treatment, we will call you to review the results.  Testing/Procedures: NONE ordered at this time of appointment   Follow-Up: At [facility], you and your health needs are our priority.  As part of our continuing mission to provide you with exceptional heart care, we have created designated Provider Care Teams.  These Care Teams include your primary Cardiologist (physician) and Advanced Practice Providers (APPs -  Physician Assistants and Nurse Practitioners) who all work together to provide you with the care you need, when you need it.  Your next appointment:   5 month(s)  The format for your next appointment:   In Person  Provider:   [name], PA-C  Other Instructions      Signed, [name],  [place]  09/18/2020 10:54 PM    [place]

## 2020-09-17 LAB — BASIC METABOLIC PANEL
BUN/Creatinine Ratio: 18 (ref 10–24)
BUN: 21 mg/dL (ref 8–27)
CO2: 29 mmol/L (ref 20–29)
Calcium: 9.5 mg/dL (ref 8.6–10.2)
Chloride: 99 mmol/L (ref 96–106)
Creatinine, Ser: 1.17 mg/dL (ref 0.76–1.27)
Glucose: 143 mg/dL — ABNORMAL HIGH (ref 65–99)
Potassium: 3.5 mmol/L (ref 3.5–5.2)
Sodium: 142 mmol/L (ref 134–144)
eGFR: 70 mL/min/{1.73_m2} (ref >59)

## 2020-09-18 ENCOUNTER — Encounter: Payer: Self-pay | Admitting: Physician Assistant

## 2020-09-24 ENCOUNTER — Ambulatory Visit (INDEPENDENT_AMBULATORY_CARE_PROVIDER_SITE_OTHER): Payer: 59 | Admitting: Specialist

## 2020-09-24 ENCOUNTER — Other Ambulatory Visit: Payer: Self-pay

## 2020-09-24 ENCOUNTER — Encounter: Payer: Self-pay | Admitting: Specialist

## 2020-09-24 VITALS — BP 123/67 | HR 73 | Ht 75.0 in | Wt 261.4 lb

## 2020-09-24 DIAGNOSIS — R29898 Other symptoms and signs involving the musculoskeletal system: Secondary | ICD-10-CM | POA: Diagnosis not present

## 2020-09-24 DIAGNOSIS — M5116 Intervertebral disc disorders with radiculopathy, lumbar region: Secondary | ICD-10-CM | POA: Diagnosis not present

## 2020-09-24 DIAGNOSIS — M48062 Spinal stenosis, lumbar region with neurogenic claudication: Secondary | ICD-10-CM | POA: Diagnosis not present

## 2020-09-24 DIAGNOSIS — M4807 Spinal stenosis, lumbosacral region: Secondary | ICD-10-CM | POA: Diagnosis not present

## 2020-09-24 MED ORDER — PREGABALIN 75 MG PO CAPS: 75.0000 mg | ORAL_CAPSULE | Freq: Two times a day (BID) | ORAL | 0 refills | Status: DC

## 2020-09-24 NOTE — Progress Notes (Signed)
Office Visit Note   Patient: [name]           Date of Birth: [birth year]           MRN: [id number] Visit Date: 09/24/2020              Requested by: [name], [place],  [place] [id number] PCP: [name], MD   Assessment & Plan: Visit Diagnoses:  1. Spinal stenosis of lumbar region with neurogenic claudication   2. Lumbar disc herniation with radiculopathy   3. Weakness of right leg   4. Spinal stenosis of lumbosacral region   5. Weakness of left hip     Plan: Avoid bending, stooping and avoid lifting weights greater than 10 lbs. Avoid prolong standing and walking. Avoid frequent bending and stooping  No lifting greater than 10 lbs. May use ice or moist heat for pain. Weight loss is of benefit. Handicap license is approved. The ESIs do not seem to be of benefit. Will need to try TENS unit and consider a spinal cord stimulator since he is not able to consider surgey. Lumbar spondylosis and left L3,L4 and L5 foramenal narrowing. TENS unit, lumbar traction, williams flexion exercises, Stationary bike.  Hemp CBD capsules, amazon.com 5,000-7,000 mg per bottle, 60 capsules per bottle, take one capsule twice a day. Cane in the left hand to use with left leg weight bearing. Follow-Up Instructions: No follow-ups on file.  Pain  Management referral. Pregabilin for pain.  Follow-Up Instructions: Return in about 4 weeks (around 10/22/2020).   Orders:  No orders of the defined types were placed in this encounter.  No orders of the defined types were placed in this encounter.     Procedures: No procedures performed   Clinical Data: No additional findings.   Subjective: No chief complaint on file.   65 year old male with history of back pain and radiation into the left leg. He has a history of 3 level cervical spine fusion surgery by Dr. Arnoldo Morale. The last surgery about 6 years ago right sided approach with good relief of pain. He is having  problems with pain into the left leg, there is pain into the leg both left anteriorly and posteriorly on the Left side. He has seen Dr. Percival Spanish and he has been told that he should not have surgery. Now completed 3 ESIs and reports the pain in his back has returned.    Review of Systems  Constitutional: Negative.   HENT: Negative.   Eyes: Negative.   Respiratory: Negative.   Cardiovascular: Negative.   Gastrointestinal: Negative.   Endocrine: Negative.   Genitourinary: Negative.   Musculoskeletal: Negative.   Skin: Negative.   Allergic/Immunologic: Negative.   Neurological: Negative.   Hematological: Negative.   Psychiatric/Behavioral: Negative.      Objective: Vital Signs: BP 123/67 (BP Location: Left Arm, Patient Position: Sitting)   Pulse 73   Ht 6\' 3"  (1.905 m)   Wt 261 lb 6.4 oz (118.6 kg)   BMI 32.67 kg/m   Physical Exam Constitutional:      Appearance: He is well-developed.  HENT:     Head: Normocephalic and atraumatic.  Eyes:     Pupils: Pupils are equal, round, and reactive to light.  Pulmonary:     Effort: Pulmonary effort is normal.     Breath sounds: Normal breath sounds.  Abdominal:     General: Bowel sounds are normal.     Palpations: Abdomen is  soft.  Musculoskeletal:     Cervical back: Normal range of motion and neck supple.     Lumbar back: Negative right straight leg raise test and negative left straight leg raise test.  Skin:    General: Skin is warm and dry.  Neurological:     Mental Status: He is alert and oriented to person, place, and time.  Psychiatric:        Behavior: Behavior normal.        Thought Content: Thought content normal.        Judgment: Judgment normal.     Back Exam   Tenderness  The patient is experiencing tenderness in the lumbar.  Muscle Strength  Right Quadriceps:  5/5  Left Quadriceps:  4/5  Right Hamstrings:  5/5  Left Hamstrings:  5/5   Tests  Straight leg raise right: negative Straight leg raise left:  negative  Reflexes  Patellar: 0/4 Achilles: 1/4 Biceps: 1/4 Babinski's sign: normal       Specialty Comments:  No specialty comments available.  Imaging: No results found.   PMFS History: Patient Active Problem List   Diagnosis Date Noted  . Dysphagia 04/17/2020  . LVH (left ventricular hypertrophy) 11/07/2019  . Heme positive stool 10/03/2019  . Anemia 10/03/2019  . Insulin dependent type 2 diabetes mellitus ([place]) 08/26/2019  . Acute on chronic diastolic heart failure ([place]) 08/26/2019  . Rash and nonspecific skin eruption 08/26/2019  . Foot pain 08/22/2019  . Right heart failure ([place]) 08/07/2019  . Leg swelling 07/28/2019  . Educated about COVID-19 virus infection 07/28/2019  . Diabetes ([place]) 06/12/2019  . Urticaria 05/15/2019  . Dermatitis due to drug 04/18/2019  . Edema 03/29/2019  . Erectile dysfunction 03/29/2019  . High risk medication use 03/29/2019  . History of iron deficiency 03/29/2019  . Pulmonary hypertension ([place]) 03/29/2019  . Venous stasis dermatitis of both lower extremities 03/29/2019  . Mixed hyperlipidemia 02/14/2019  . Onychomycosis due to dermatophyte 11/06/2018  . Hammer toe of right foot 07/17/2018  . Tinea pedis of both feet 07/17/2018  . History of radiofrequency ablation (RFA) procedure for cardiac arrhythmia 06/21/2018  . Lumbar back pain with radiculopathy affecting lower extremity 06/21/2018  . Hepatic cirrhosis ([place]) 12/26/2017  . Left lower quadrant pain 12/05/2017  . Change in bowel habits 12/05/2017  . Palpitations 10/03/2017  . SOB (shortness of breath) 04/12/2017  . Chest pain with moderate risk of acute coronary syndrome 08/28/2016  . Unstable angina ([place]) 08/26/2016  . Longstanding persistent atrial fibrillation ([place]) 04/21/2015  . Deviated nasal septum 02/19/2014  . History of colon polyps 11/22/2012  . Syncope 10/17/2012  . Acute asthmatic bronchitis 11/22/2011  . Benign neoplasm of colon 10/13/2011  . Unspecified  gastritis and gastroduodenitis without mention of hemorrhage 10/13/2011  . GERD (gastroesophageal reflux disease) 10/13/2011  . Obesity (BMI 30-39.9) 09/30/2011  . Dyslipidemia 06/23/2011  . Neuropathy 06/23/2011  . Fatigue 05/13/2011  . Malaise and fatigue 05/13/2011  . Rectal bleeding 04/18/2011  . Atrial flutter ([place]) 04/18/2011  . Chronic anticoagulation 11/10/2010  . PAF (paroxysmal atrial fibrillation) ([place])   . Tobacco abuse   . COPD (chronic obstructive pulmonary disease) ([place]) 02/11/2010  . Essential hypertension 01/28/2010  . OSA (obstructive sleep apnea) 01/28/2010   Past Medical History:  Diagnosis Date  . Arthritis    lower back  . Asthma   . Atrial flutter ([place])    s/p CTI ablation by Dr Rayann Heman  . Brain aneurysm 2009   questionable. A  follow up CTA in 2009 showed no evidence of  . Chronic back pain    DDD/stenosis  . Colon polyps    9 polyps removed 10/13/11  . Complication of anesthesia September 11, 2012   slow to awaken after ablation  . COPD (chronic obstructive pulmonary disease) ([place])   . Diabetes mellitus    takes Metformin and Glimepiride daily  . Emphysema   . GERD (gastroesophageal reflux disease)    takes Omeprazole bid  . History of shingles   . HLD (hyperlipidemia)    takes Pravastatin daily  . HTN (hypertension)    takes Prinizide daily  . Obesity   . OSA (obstructive sleep apnea)    not always using cpap  . Overdose 2009   unintentional Flecanide overdose  . Peripheral neuropathy   . Short-term memory loss   . Tobacco abuse     Family History  Problem Relation Age of Onset  . Diabetes Mother   . Heart disease Father   . Lung cancer Father   . Diabetes Maternal Grandmother   . Colon cancer Neg Hx   . Anesthesia problems Neg Hx   . Hypotension Neg Hx   . Malignant hyperthermia Neg Hx   . Pseudochol deficiency Neg Hx   . Esophageal cancer Neg Hx   . Pancreatic cancer Neg Hx   . Stomach cancer Neg Hx     Past Surgical History:   Procedure Laterality Date  . ANTERIOR CERVICAL DECOMP/DISCECTOMY FUSION  01/04/2012   Procedure: ANTERIOR CERVICAL DECOMPRESSION/DISCECTOMY FUSION 1 LEVEL/HARDWARE REMOVAL;  Surgeon: Ophelia Charter, MD;  Location: [facility];  Service: Neurosurgery;  Laterality: N/A;  explore cervical fusion Cervical six - seven  with removal of codman plate anterior cervical decompression with fusion interbody prothesis plating and bonegraft  . APPENDECTOMY  2-62yrs ago  . ATRIAL FIBRILLATION ABLATION N/A 09/11/2012   PT DID NOT HAVE AN ATRIAL FIBRILLATION ABLATION IN 2014!  ATRIAL FLUTTER ABLATION ONLY  . ATRIAL FLUTTER ABLATION  09/11/2012   CTI ablation by Dr Rayann Heman  . BIOPSY  11/07/2019   Procedure: BIOPSY;  Surgeon: Milus Banister, MD;  Location: [facility] ENDOSCOPY;  Service: Endoscopy;;  . CARDIAC CATHETERIZATION  2008   no significant CAD  . CARDIOVERSION  05/05/2011   Procedure: CARDIOVERSION;  Surgeon: Lelon Perla, MD;  [facility];  Service: Cardiovascular;  Laterality: N/A;  . CARDIOVERSION Bilateral 07/26/2012   Procedure: CARDIOVERSION;  Surgeon: Minus Breeding, MD;  Location: [facility] ENDOSCOPY;  Service: Cardiovascular;  Laterality: Bilateral;  . CARPAL TUNNEL RELEASE  99/2000   bilateral  . COLONOSCOPY WITH PROPOFOL N/A 12/13/2012   Procedure: COLONOSCOPY WITH PROPOFOL;  Surgeon: Milus Banister, MD;  Location: [facility] ENDOSCOPY;  Service: Endoscopy;  Laterality: N/A;  . ELECTROPHYSIOLOGIC STUDY N/A 04/21/2015   Procedure: Atrial Fibrillation Ablation;  Surgeon: Thompson Grayer, MD;  Location: [facility] CV LAB;  Service: Cardiovascular;  Laterality: N/A;  . ESOPHAGOGASTRODUODENOSCOPY (EGD) WITH PROPOFOL N/A 11/07/2019   Procedure: ESOPHAGOGASTRODUODENOSCOPY (EGD) WITH PROPOFOL;  Surgeon: Milus Banister, MD;  Location: [facility] ENDOSCOPY;  Service: Endoscopy;  Laterality: N/A;  . HERNIA REPAIR    . KNEE SURGERY  6-54yrs ago   left  . LEFT HEART CATH AND CORONARY ANGIOGRAPHY N/A 09/19/2016    Procedure: Left Heart Cath and Coronary Angiography;  Surgeon: Belva Crome, MD;  Location: [facility] CV LAB;  Service: Cardiovascular;  Laterality: N/A;  . Left inguinal hernia repair     as a child  .  NASAL SEPTOPLASTY W/ TURBINOPLASTY Bilateral 02/19/2014   Procedure: NASAL SEPTOPLASTY WITH BILATERAL TURBINATE REDUCTION;  Surgeon: Jodi Marble, MD;  Location: [facility];  Service: ENT;  Laterality: Bilateral;  . NECK SURGERY  63yrs ago  . right shoulder surgery  4-14yrs ago   cyst removed  . TEE WITHOUT CARDIOVERSION  05/05/2011   Procedure: TRANSESOPHAGEAL ECHOCARDIOGRAM (TEE);  Surgeon: Lelon Perla, MD;  Location: [facility] ENDOSCOPY;  Service: Cardiovascular;  Laterality: N/A;  . TEE WITHOUT CARDIOVERSION N/A 04/21/2015   Procedure: TRANSESOPHAGEAL ECHOCARDIOGRAM (TEE);  Surgeon: Sueanne Margarita, MD;  Location: [facility] ENDOSCOPY;  Service: Cardiovascular;  Laterality: N/A;  . THROAT SURGERY  4-50yrs ago   "thought " it was cancer but came back not  . TONSILLECTOMY     Social History   Occupational History  . Occupation: Dealer   Tobacco Use  . Smoking status: Current Every Day Smoker    Packs/day: 1.00    Years: 49.00    Pack years: 49.00    Types: Cigarettes  . Smokeless tobacco: Never Used  . Tobacco comment: 1 pack per day 04/16/2020  Vaping Use  . Vaping Use: Never used  Substance and Sexual Activity  . Alcohol use: Not Currently    Alcohol/week: 0.0 standard drinks    Comment: 12/05/17 pt stated he has drinked 1/2 drink in 3 years  . Drug use: No  . Sexual activity: Yes

## 2020-09-24 NOTE — Patient Instructions (Addendum)
Avoid bending, stooping and avoid lifting weights greater than 10 lbs. Avoid prolong standing and walking. Avoid frequent bending and stooping  No lifting greater than 10 lbs. May use ice or moist heat for pain. Weight loss is of benefit. Handicap license is approved. The ESIs do not seem to be of benefit. Will need to try TENS unit and consider a spinal cord stimulator since he is not able to consider surgey. Lumbar spondylosis and left L3,L4 and L5 foramenal narrowing. TENS unit, lumbar traction, williams flexion exercises, Stationary bike.  Hemp CBD capsules, amazon.com 5,000-7,000 mg per bottle, 60 capsules per bottle, take one capsule twice a day. Cane in the left hand to use with left leg weight bearing. Follow-Up Instructions: No follow-ups on file.  Pain  Management referral. Pregabilin for pain.

## 2020-10-02 ENCOUNTER — Ambulatory Visit: Payer: 59 | Admitting: Podiatry

## 2020-10-05 ENCOUNTER — Encounter: Payer: Self-pay | Admitting: Podiatry

## 2020-10-05 ENCOUNTER — Other Ambulatory Visit: Payer: Self-pay

## 2020-10-05 ENCOUNTER — Ambulatory Visit (INDEPENDENT_AMBULATORY_CARE_PROVIDER_SITE_OTHER): Payer: 59 | Admitting: Podiatry

## 2020-10-05 DIAGNOSIS — Z7901 Long term (current) use of anticoagulants: Secondary | ICD-10-CM | POA: Diagnosis not present

## 2020-10-05 DIAGNOSIS — E0843 Diabetes mellitus due to underlying condition with diabetic autonomic (poly)neuropathy: Secondary | ICD-10-CM

## 2020-10-05 DIAGNOSIS — B351 Tinea unguium: Secondary | ICD-10-CM

## 2020-10-05 DIAGNOSIS — M79676 Pain in unspecified toe(s): Secondary | ICD-10-CM

## 2020-10-05 NOTE — Progress Notes (Signed)
This patient returns to my office for at risk foot care.  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   Patient is taking coumadin.  This patient is unable to cut nails himself since the patient cannot reach his nails.These nails are painful walking and wearing shoes.  This patient presents for at risk foot care today. Patient states he has pain in feet due to neuropathy.  Patient states  healing 1,4 left foot from previous surgery.  General Appearance  Alert, conversant and in no acute stress.  Vascular  Dorsalis pedis and posterior tibial  pulses are palpable  bilaterally.  Capillary return is within normal limits  bilaterally. Temperature is within normal limits  bilaterally.  Neurologic  Senn-Weinstein monofilament wire test absent  bilaterally. Muscle power within normal limits bilaterally.  Nails Thick disfigured discolored nails with subungual debris  from first   to fifth toes bilaterally. No evidence of bacterial infection or drainage bilaterally.  Orthopedic  No limitations of motion  feet .  No crepitus or effusions noted.  No bony pathology or digital deformities noted.  Skin  normotropic skin with no porokeratosis noted bilaterally.  No signs of infections or ulcers noted.     Onychomycosis  Pain in right toes  Pain in left toes  Consent was obtained for treatment procedures.   Mechanical debridement of nails 1-5  bilaterally performed with a nail nipper except 1,4 left foot.Danley Danker with dremel without incident.    Return office visit     12 weeks                 Told patient to return for periodic foot care and evaluation due to potential at risk complications.   Gardiner Barefoot DPM

## 2020-10-06 ENCOUNTER — Telehealth: Payer: Self-pay | Admitting: Endocrinology

## 2020-10-06 NOTE — Telephone Encounter (Signed)
Pt calling in stating that he has some questions about Dulaglutide (TRULICITY) 1.5 LH/7.3SK SOPN that he has been taking and would like a call back

## 2020-10-06 NOTE — Telephone Encounter (Signed)
LVM for pt to cb to the office with his questions/concerns

## 2020-10-08 NOTE — Telephone Encounter (Signed)
Pt is having some eye sight problems and is wondering if the trulicity would have something to do with it . Pt would like the Dexom g6 instead of the free style libre. States that the free style Elenor Legato is giving him bad readings.

## 2020-10-12 MED ORDER — DEXCOM G6 SENSOR MISC: 1.0000 | 3 refills | Status: DC

## 2020-10-12 MED ORDER — DEXCOM G6 TRANSMITTER MISC: 1.0000 | Freq: Once | 1 refills | Status: AC

## 2020-10-12 MED ORDER — DEXCOM G6 RECEIVER DEVI: 1.0000 | Freq: Once | 1 refills | Status: AC

## 2020-10-12 NOTE — Addendum Note (Signed)
Addended by: Renato Shin on: 10/12/2020 10:17 AM   Modules accepted: Orders

## 2020-10-12 NOTE — Telephone Encounter (Signed)
I have sent a prescription to your pharmacy, for the dexcom.  Please see eye dr for vision probs.  Do you need a referral?

## 2020-10-14 ENCOUNTER — Ambulatory Visit: Payer: 59 | Attending: Specialist

## 2020-10-14 NOTE — Telephone Encounter (Signed)
Message sent thru MyChart 

## 2020-10-15 ENCOUNTER — Ambulatory Visit (INDEPENDENT_AMBULATORY_CARE_PROVIDER_SITE_OTHER): Payer: 59

## 2020-10-15 ENCOUNTER — Ambulatory Visit (INDEPENDENT_AMBULATORY_CARE_PROVIDER_SITE_OTHER): Payer: 59 | Admitting: Adult Health

## 2020-10-15 ENCOUNTER — Encounter: Payer: Self-pay | Admitting: *Deleted

## 2020-10-15 ENCOUNTER — Other Ambulatory Visit: Payer: Self-pay

## 2020-10-15 ENCOUNTER — Encounter: Payer: Self-pay | Admitting: Adult Health

## 2020-10-15 DIAGNOSIS — I480 Paroxysmal atrial fibrillation: Secondary | ICD-10-CM

## 2020-10-15 DIAGNOSIS — J449 Chronic obstructive pulmonary disease, unspecified: Secondary | ICD-10-CM | POA: Diagnosis not present

## 2020-10-15 DIAGNOSIS — Z720 Tobacco use: Secondary | ICD-10-CM

## 2020-10-15 DIAGNOSIS — Z7901 Long term (current) use of anticoagulants: Secondary | ICD-10-CM | POA: Diagnosis not present

## 2020-10-15 DIAGNOSIS — Z5181 Encounter for therapeutic drug level monitoring: Secondary | ICD-10-CM | POA: Diagnosis not present

## 2020-10-15 LAB — POCT INR: INR: 1.9 — AB (ref 2.0–3.0)

## 2020-10-15 MED ORDER — ALBUTEROL SULFATE HFA 108 (90 BASE) MCG/ACT IN AERS: 1.0000 | INHALATION_SPRAY | Freq: Four times a day (QID) | RESPIRATORY_TRACT | 5 refills | Status: DC | PRN

## 2020-10-15 MED ORDER — ANORO ELLIPTA 62.5-25 MCG/INH IN AEPB: 1.0000 | INHALATION_SPRAY | Freq: Every day | RESPIRATORY_TRACT | 5 refills | Status: DC

## 2020-10-15 NOTE — Assessment & Plan Note (Signed)
Smoking cessation encouraged.  Continue to present in the low-dose CT screening program

## 2020-10-15 NOTE — Patient Instructions (Addendum)
Working on not smoking .  Continue ANORO 1 puff daily. Rinse after use.  Albuterol inhaler 1-2 puffs every 6hr as needed.  Activity as tolerated.  Work on healthy weight loss.  Do not drive if sleepy Follow up with Dr. Elsworth Soho  In 4-6  months and As needed   Please contact office for sooner follow up if symptoms do not improve or worsen or seek emergency care

## 2020-10-15 NOTE — Assessment & Plan Note (Signed)
Currently compensated on present regimen. Continue on Anoro daily.  Smoking cessation encouraged. Will discuss on return visit PFTs in the next year  Plan  Patient Instructions  Working on not smoking .  Continue ANORO 1 puff daily. Rinse after use.  Albuterol inhaler 1-2 puffs every 6hr as needed.  Activity as tolerated.  Work on healthy weight loss.  Do not drive if sleepy Follow up with Dr. Elsworth Soho  In 4-6  months and As needed   Please contact office for sooner follow up if symptoms do not improve or worsen or seek emergency care

## 2020-10-15 NOTE — Addendum Note (Signed)
Addended by: Vanessa Barbara on: 10/15/2020 10:15 AM   Modules accepted: Orders

## 2020-10-15 NOTE — Patient Instructions (Signed)
Take 2 tablets tonight only and then continue taking 1 tablet daily except for 1/2 tablet every Monday, Wednesday and Friday. Repeat INR  in 6 weeks.

## 2020-10-15 NOTE — Progress Notes (Signed)
@Patient  ID: [name], male    DOB: [birth year], 65 y.o.   MRN: [id number]  Chief Complaint  Patient presents with  . Follow-up    Referring provider: Timoteo Gaul, FNP  HPI: 65 yo male active smoker followed for COPD Medical history significant for A Fib s/p ablation in 2016  OSA -CPAP intolerant   TEST/EVENTS :  Pulmonary function testing Sep 16, 2015 showed moderate COPD with an FEV1 at 65%, ratio 68, FVC 73%, positive bronchodilator response.  Echo 07/2019 - EF 60-65%, Grade 2 DD, Mild LVH , moderately elevated PAP (52.65mmHg), severe dilated LA , RA mod dilated. , TV mild to mod regurgitation ,   10/15/2020 Follow up: COPD  Patient returns for a 6 month follow up.  Patient says overall his breathing is doing about the same.  He has a daily cough that is intermittently congested.  He gets short of breath with activities but is actually trying to be more active.  He is working some as a Dealer.  He is limited by his breathing and his back pain.  But says he is more active than he has been in a long time.  He denies any fever, hemoptysis, chest pain, orthopnea. Remains on ANORO . Albuterol inhaler As needed says he needs a refill of his albuterol as it is expired He participates in the lung cancer screening program. LDCT chest 07/2020 -lung rads 1 , Emphysema .  He continues to smoke.  We discussed smoking cessation  Patient is being followed by ENT for dysphagia.  Underwent laryngoscopy December 2021.  ENT notes showed normal vocal cords and vocal cord mobility.  Some mild edema of the mucosa indicative of reflux.  Upper esophagus was clear on fibrotic laryngoscopy.  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 He says he is going back to ENT later this month for further evaluation of the uvula papilloma.   Allergies  Allergen Reactions  . Adhesive [Tape]     itching  . Latex Itching    When tape is on the skin too long skin gets red & itching    Immunization History   Administered Date(s) Administered  . Influenza Split 01/30/2011, 03/05/2012, 02/13/2013, 02/03/2016, 02/20/2019  . Influenza Whole 02/13/2009  . Influenza,inj,Quad PF,6+ Mos 03/14/2014, 04/22/2015, 01/19/2016, 02/02/2017, 02/05/2019, 05/01/2020  . Influenza-Unspecified 01/30/2011, 03/05/2012, 02/13/2013, 03/14/2014, 04/22/2015, 01/19/2016, 02/03/2016, 02/02/2017, 02/13/2018  . Pneumococcal Polysaccharide-23 02/14/2007    Past Medical History:  Diagnosis Date  . Arthritis    lower back  . Asthma   . Atrial flutter ([place])    s/p CTI ablation by Dr Rayann Heman  . Brain aneurysm 2009   questionable. A follow up CTA in 2009 showed no evidence of  . Chronic back pain    DDD/stenosis  . Colon polyps    9 polyps removed 10/13/11  . Complication of anesthesia September 11, 2012   slow to awaken after ablation  . COPD (chronic obstructive pulmonary disease) ([place])   . Diabetes mellitus    takes Metformin and Glimepiride daily  . Emphysema   . GERD (gastroesophageal reflux disease)    takes Omeprazole bid  . History of shingles   . HLD (hyperlipidemia)    takes Pravastatin daily  . HTN (hypertension)    takes Prinizide daily  . Obesity   . OSA (obstructive sleep apnea)    not always using cpap  . Overdose 2009   unintentional Flecanide overdose  . Peripheral neuropathy   .  Short-term memory loss   . Tobacco abuse     Tobacco History: Social History   Tobacco Use  Smoking Status Current Every Day Smoker  . Packs/day: 1.00  . Years: 49.00  . Pack years: 49.00  . Types: Cigarettes  Smokeless Tobacco Never Used  Tobacco Comment   1/2 pack per day 10/15/20   Ready to quit: No Counseling given: Yes Comment: 1/2 pack per day 10/15/20   Outpatient Medications Prior to Visit  Medication Sig Dispense Refill  . albuterol (PROAIR HFA) 108 (90 Base) MCG/ACT inhaler Inhale 1-2 puffs into the lungs every 6 (six) hours as needed for wheezing or shortness of breath. 18 g 5  . Ascorbic Acid  (VITAMIN C PO) Take 500 mg by mouth daily.     [name] b complex vitamins tablet Take 1 tablet by mouth daily.     . bisoprolol (ZEBETA) 10 MG tablet Take 10 mg by mouth daily.     . Continuous Blood Gluc Sensor (DEXCOM G6 SENSOR) MISC 1 Device by Does not apply route See admin instructions. Change every 10 days 9 each 3  . diltiazem (CARDIZEM CD) 120 MG 24 hr capsule Take 120 mg by mouth daily.    . Dulaglutide (TRULICITY) 1.5 HE/5.2DP SOPN Inject 1.5 mg into the skin weekly 2 mL 1  . gabapentin (NEURONTIN) 300 MG capsule Take 300 mg by mouth 2 (two) times daily. Patient takes 1 tablet in morning and 2 tablets in evening    . insulin aspart (NOVOLOG) 100 UNIT/ML FlexPen Inject 80 Units into the skin 3 (three) times daily with meals.    . Insulin Pen Needle (B-D UF III MINI PEN NEEDLES) 31G X 5 MM MISC 1 Device by Other route 3 (three) times daily with meals. Use three times daily to inject insulin. 300 each 3  . losartan (COZAAR) 50 MG tablet Take 1 tablet (50 mg total) by mouth daily. 90 tablet 3  . metFORMIN (GLUCOPHAGE-XR) 500 MG 24 hr tablet Take 1 tablet (500 mg total) by mouth at bedtime. 90 tablet 3  . metolazone (ZAROXOLYN) 2.5 MG tablet TAKE 1 TABLET BY MOUTH ON TUESDAY AND THURSDAY 30 MINUTES BEFORE TAKING TORSEMIDE OR AS DIRECTED 15 tablet 3  . Multiple Vitamin (MULTIVITAMIN WITH MINERALS) TABS Take 1 tablet by mouth daily.     . Multiple Vitamins-Minerals (ZINC PO) Take 50 mg by mouth daily.     . nitroGLYCERIN (NITROSTAT) 0.4 MG SL tablet Place 1 tablet (0.4 mg total) under the tongue every 5 (five) minutes x 3 doses as needed for chest pain. 25 tablet 3  . omeprazole (PRILOSEC) 20 MG capsule Take 20 mg by mouth 2 (two) times daily.    [name] oxyCODONE-acetaminophen (PERCOCET) 10-325 MG tablet Take 1 tablet by mouth 4 (four) times daily as needed.    . potassium chloride SA (KLOR-CON) 20 MEQ tablet TAKE 1 TABLET BY MOUTH DAILY. TAKE AN EXTRA TABLET ON TUESDAY AND THURSDAY 40 tablet 11  .  pravastatin (PRAVACHOL) 80 MG tablet TAKE 1 TABLET BY MOUTH DAILY AT BEDTIME 30 tablet 10  . pregabalin (LYRICA) 75 MG capsule Take 1 capsule (75 mg total) by mouth 2 (two) times daily. 60 capsule 0  . torsemide (DEMADEX) 20 MG tablet TAKE 2 TABLETS BY MOUTH 2 TIMES DAILY 360 tablet 2  . triamcinolone cream (KENALOG) 0.1 % Apply 1 application topically daily as needed. 453 g 2  . umeclidinium-vilanterol (ANORO ELLIPTA) 62.5-25 MCG/INH AEPB Inhale 1 puff into the  lungs daily. 60 each 5  . VITAMIN A PO Take 2,400 mcg by mouth daily.     . Vitamin D, Ergocalciferol, (DRISDOL) 1.25 MG (50000 UT) CAPS capsule Take 50,000 Units by mouth every 7 (seven) days.     [name] warfarin (COUMADIN) 5 MG tablet TAKE 1 TO 1 AND 1/2 TABLETS BY MOUTH DAILY AS DIRECTED BY THE COUMADIN CLINIC 45 tablet 1   No facility-administered medications prior to visit.     Review of Systems:   Constitutional:   No  weight loss, night sweats,  Fevers, chills,  +fatigue, or  lassitude.  HEENT:   No headaches,  Difficulty swallowing,  Tooth/dental problems, or  Sore throat,                No sneezing, itching, ear ache,  +nasal congestion, post nasal drip,   CV:  No chest pain,  Orthopnea, PND, swelling in lower extremities, anasarca, dizziness, palpitations, syncope.   GI  No heartburn, indigestion, abdominal pain, nausea, vomiting, diarrhea, change in bowel habits, loss of appetite, bloody stools.   Resp:  .  No chest wall deformity  Skin: no rash or lesions.  GU: no dysuria, change in color of urine, no urgency or frequency.  No flank pain, no hematuria   MS:  No joint pain or swelling.  No decreased range of motion.  No back pain.    Physical Exam  BP (!) 100/58 (BP Location: Right Arm, Patient Position: Sitting, Cuff Size: Large)   Pulse 78   Temp (!) 97.4 F (36.3 C) (Temporal)   Ht 6\' 2"  (1.88 m)   Wt 262 lb 6.4 oz (119 kg)   SpO2 95%   BMI 33.69 kg/m   GEN: A/Ox3; pleasant , NAD, elderly    HEENT:   [place]/AT,   NOSE-clear, THROAT-clear, no lesions, no postnasal drip or exudate noted.   NECK:  Supple w/ fair ROM; no JVD; normal carotid impulses w/o bruits; no thyromegaly or nodules palpated; no lymphadenopathy.    RESP few scattered rhonchi  no accessory muscle use, no dullness to percussion  CARD:  RRR, no m/r/g, tr peripheral edema, pulses intact, no cyanosis or clubbing.  GI:   Soft & nt; nml bowel sounds; no organomegaly or masses detected.   Musco: Warm bil, no deformities or joint swelling noted.   Neuro: alert, no focal deficits noted.    Skin: Warm, no lesions or rashes    Lab Results:   BMET   Imaging: No results found.  dexamethasone (DECADRON) injection 15 mg    Date Action Dose Route User   08/31/2020 1022 Given 15 mg Other Magnus Sinning, MD      PFT Results Latest Ref Rng & Units 09/16/2015  FVC-Pre L 3.57  FVC-Predicted Pre % 64  FVC-Post L 4.09  FVC-Predicted Post % 73  Pre FEV1/FVC % % 67  Post FEV1/FCV % % 68  FEV1-Pre L 2.39  FEV1-Predicted Pre % 56  FEV1-Post L 2.79    No results found for: NITRICOXIDE      Assessment & Plan:   COPD (chronic obstructive pulmonary disease) (HCC) Currently compensated on present regimen. Continue on Anoro daily.  Smoking cessation encouraged. Will discuss on return visit PFTs in the next year  Plan  Patient Instructions  Working on not smoking .  Continue ANORO 1 puff daily. Rinse after use.  Albuterol inhaler 1-2 puffs every 6hr as needed.  Activity as tolerated.  Work on healthy weight loss.  Do not drive if sleepy Follow up with Dr. Elsworth Soho  In 4-6  months and As needed   Please contact office for sooner follow up if symptoms do not improve or worsen or seek emergency care        Tobacco abuse Smoking cessation encouraged.  Continue to present in the low-dose CT screening program     Rexene Edison, NP 10/15/2020

## 2020-10-21 ENCOUNTER — Telehealth: Payer: Self-pay | Admitting: Specialist

## 2020-10-21 NOTE — Telephone Encounter (Signed)
I called and put him on [facility] schedule tomorrow

## 2020-10-21 NOTE — Telephone Encounter (Signed)
Pt called stating he is in a lot of pain and it is hard to sleep/walk. Made appt with Dr. Louanne Skye on 10/29/20 but unsure if he needed to make an appt with the PA to be seen sooner. Also asked about medication options to help, currently on percocet prescription.   The best CB# is [phone].

## 2020-10-22 ENCOUNTER — Ambulatory Visit (INDEPENDENT_AMBULATORY_CARE_PROVIDER_SITE_OTHER): Payer: 59 | Admitting: Surgery

## 2020-10-22 ENCOUNTER — Ambulatory Visit: Payer: Self-pay

## 2020-10-22 VITALS — BP 123/65 | HR 83 | Ht 74.0 in | Wt 260.0 lb

## 2020-10-22 DIAGNOSIS — M2550 Pain in unspecified joint: Secondary | ICD-10-CM

## 2020-10-22 DIAGNOSIS — M25562 Pain in left knee: Secondary | ICD-10-CM

## 2020-10-22 DIAGNOSIS — G8929 Other chronic pain: Secondary | ICD-10-CM

## 2020-10-23 LAB — GRAM STAIN
GRAM STAIN:: NONE SEEN
MICRO NUMBER:: 11988765
SPECIMEN QUALITY:: ADEQUATE

## 2020-10-23 LAB — SYNOVIAL FLUID ANALYSIS, COMPLETE
Basophils, %: 0 %
Eosinophils-Synovial: 0 % (ref 0–2)
Lymphocytes-Synovial Fld: 2 % (ref 0–74)
Monocyte/Macrophage: 0 % (ref 0–69)
Neutrophil, Synovial: 98 % — ABNORMAL HIGH (ref 0–24)
Synoviocytes, %: 0 % (ref 0–15)
WBC, Synovial: 6056 cells/uL — ABNORMAL HIGH (ref <150)

## 2020-10-24 LAB — CBC WITH DIFFERENTIAL/PLATELET
Absolute Monocytes: 532 cells/uL (ref 200–950)
Basophils Absolute: 22 cells/uL (ref 0–200)
Basophils Relative: 0.4 %
Eosinophils Absolute: 112 cells/uL (ref 15–500)
Eosinophils Relative: 2 %
HCT: 32.3 % — ABNORMAL LOW (ref 38.5–50.0)
Hemoglobin: 10.4 g/dL — ABNORMAL LOW (ref 13.2–17.1)
Lymphs Abs: 1282 cells/uL (ref 850–3900)
MCH: 29.5 pg (ref 27.0–33.0)
MCHC: 32.2 g/dL (ref 32.0–36.0)
MCV: 91.5 fL (ref 80.0–100.0)
MPV: 12.1 fL (ref 7.5–12.5)
Monocytes Relative: 9.5 %
Neutro Abs: 3651 cells/uL (ref 1500–7800)
Neutrophils Relative %: 65.2 %
Platelets: 129 10*3/uL — ABNORMAL LOW (ref 140–400)
RBC: 3.53 10*6/uL — ABNORMAL LOW (ref 4.20–5.80)
RDW: 15.9 % — ABNORMAL HIGH (ref 11.0–15.0)
Total Lymphocyte: 22.9 %
WBC: 5.6 10*3/uL (ref 3.8–10.8)

## 2020-10-24 LAB — RHEUMATOID FACTOR: Rheumatoid fact SerPl-aCnc: <14 IU/mL (ref <14)

## 2020-10-24 LAB — ANTI-NUCLEAR AB-TITER (ANA TITER)
ANA TITER: 1:40 {titer} — ABNORMAL HIGH
ANA Titer 1: 1:40 {titer} — ABNORMAL HIGH

## 2020-10-24 LAB — ANA: Anti Nuclear Antibody (ANA): POSITIVE — AB

## 2020-10-24 LAB — SEDIMENTATION RATE: Sed Rate: 55 mm/h — ABNORMAL HIGH (ref 0–20)

## 2020-10-24 LAB — URIC ACID: Uric Acid, Serum: 11.3 mg/dL — ABNORMAL HIGH (ref 4.0–8.0)

## 2020-10-29 ENCOUNTER — Ambulatory Visit (INDEPENDENT_AMBULATORY_CARE_PROVIDER_SITE_OTHER): Payer: 59 | Admitting: Specialist

## 2020-10-29 ENCOUNTER — Other Ambulatory Visit: Payer: Self-pay

## 2020-10-29 ENCOUNTER — Encounter: Payer: Self-pay | Admitting: Specialist

## 2020-10-29 VITALS — BP 128/67 | HR 71 | Ht 74.0 in | Wt 260.0 lb

## 2020-10-29 DIAGNOSIS — M2550 Pain in unspecified joint: Secondary | ICD-10-CM | POA: Diagnosis not present

## 2020-10-29 DIAGNOSIS — M48062 Spinal stenosis, lumbar region with neurogenic claudication: Secondary | ICD-10-CM | POA: Diagnosis not present

## 2020-10-29 DIAGNOSIS — M25562 Pain in left knee: Secondary | ICD-10-CM

## 2020-10-29 DIAGNOSIS — E790 Hyperuricemia without signs of inflammatory arthritis and tophaceous disease: Secondary | ICD-10-CM

## 2020-10-29 MED ORDER — ALLOPURINOL 300 MG PO TABS: 300.0000 mg | ORAL_TABLET | Freq: Every day | ORAL | 3 refills | Status: DC

## 2020-10-29 MED ORDER — COLCHICINE 0.6 MG PO TABS: 0.6000 mg | ORAL_TABLET | Freq: Two times a day (BID) | ORAL | 1 refills | Status: DC

## 2020-10-29 NOTE — Patient Instructions (Signed)
Plan: Elevated uric acid level indicates that some of the joint pain may be due to inflamation due to hyperuricemia or gout. Recommend antigout meds including colchicine and allopurinol and assessment of renal function. Patients with increased uric acid levels may have this due to poor kidney secretion of the uric acid into the urine due to  Decrease kidney function or due to increased levels of uric acid due to a hypermetablolic state that can be seen with occult tumor or increased protein intake. A thorough medical evaluation is recommended.   Colchicine and allopurinol are good for treatment of gout and long term use of the allopurinal will decrease the the episodes of gout inflamation of the joints.  Follow-Up Instructions: No follow-ups on file.

## 2020-10-29 NOTE — Progress Notes (Signed)
Office Visit Note   Patient: [name]           Date of Birth: 1955-08-09           MRN: [id number] Visit Date: 10/29/2020              Requested by: [name], [place],  [place] [id number] PCP: [name], MD   Assessment & Plan: Visit Diagnoses:  1. Hyperuricemia   2. Spinal stenosis of lumbar region with neurogenic claudication   3. Polyarthralgia   4. Acute pain of left knee     Plan: Elevated uric acid level indicates that some of the joint pain may be due to inflamation due to hyperuricemia or gout. Recommend antigout meds including colchicine and allopurinol and assessment of renal function. Patients with increased uric acid levels may have this due to poor kidney secretion of the uric acid into the urine due to  Decrease kidney function or due to increased levels of uric acid due to a hypermetablolic state that can be seen with occult tumor or increased protein intake. A thorough medical evaluation is recommended.   Colchicine and allopurinol are good for treatment of gout and long term use of the allopurinal will decrease the the episodes of gout inflamation of the joints.  Follow-Up Instructions: No follow-ups on file.   Orders:  No orders of the defined types were placed in this encounter.  No orders of the defined types were placed in this encounter.     Procedures: No procedures performed   Clinical Data: No additional findings.   Subjective: Chief Complaint  Patient presents with   Lower Back - Pain    Having the same ole pains as always    45 year olf male with history of back pain and significant cardiac history. No really a candidate for intervention for spinal stenosis.  He is also having bilateral knee OA and he was seen last week for aspiration of the left knew and lab was done.   Review of Systems  Constitutional: Negative.  Negative for activity change, appetite change, chills, diaphoresis, fatigue, fever and  unexpected weight change.  HENT: Negative.    Eyes: Negative.   Respiratory: Negative.    Cardiovascular: Negative.   Gastrointestinal: Negative.   Endocrine: Negative.   Genitourinary: Negative.   Musculoskeletal: Negative.   Skin: Negative.   Allergic/Immunologic: Negative.   Neurological: Negative.   Hematological: Negative.   Psychiatric/Behavioral: Negative.      Objective: Vital Signs: BP 128/67 (BP Location: Left Arm, Patient Position: Sitting)   Pulse 71   Ht 6\' 2"  (1.88 m)   Wt 260 lb (117.9 kg)   BMI 33.38 kg/m   Physical Exam  Ortho Exam  Specialty Comments:  No specialty comments available.  Imaging: No results found.   PMFS History: Patient Active Problem List   Diagnosis Date Noted   Dysphagia 04/17/2020   LVH (left ventricular hypertrophy) 11/07/2019   Heme positive stool 10/03/2019   Anemia 10/03/2019   Insulin dependent type 2 diabetes mellitus ([place]) 08/26/2019   Acute on chronic diastolic heart failure ([place]) 08/26/2019   Rash and nonspecific skin eruption 08/26/2019   Foot pain 08/22/2019   Right heart failure ([place]) 08/07/2019   Leg swelling 07/28/2019   Educated about COVID-19 virus infection 07/28/2019   Diabetes ([place]) 06/12/2019   Urticaria 05/15/2019   Dermatitis due to drug 04/18/2019   Edema 03/29/2019   Erectile dysfunction 03/29/2019  High risk medication use 03/29/2019   History of iron deficiency 03/29/2019   Pulmonary hypertension ([place]) 03/29/2019   Venous stasis dermatitis of both lower extremities 03/29/2019   Mixed hyperlipidemia 02/14/2019   Onychomycosis due to dermatophyte 11/06/2018   Hammer toe of right foot 07/17/2018   Tinea pedis of both feet 07/17/2018   History of radiofrequency ablation (RFA) procedure for cardiac arrhythmia 06/21/2018   Lumbar back pain with radiculopathy affecting lower extremity 06/21/2018   Hepatic cirrhosis ([place]) 12/26/2017   Left lower quadrant pain 12/05/2017   Change in bowel habits  12/05/2017   Palpitations 10/03/2017   SOB (shortness of breath) 04/12/2017   Chest pain with moderate risk of acute coronary syndrome 08/28/2016   Unstable angina (HCC) 08/26/2016   Longstanding persistent atrial fibrillation ([place]) 04/21/2015   Deviated nasal septum 02/19/2014   History of colon polyps 11/22/2012   Syncope 10/17/2012   Acute asthmatic bronchitis 11/22/2011   Benign neoplasm of colon 10/13/2011   Unspecified gastritis and gastroduodenitis without mention of hemorrhage 10/13/2011   GERD (gastroesophageal reflux disease) 10/13/2011   Obesity (BMI 30-39.9) 09/30/2011   Dyslipidemia 06/23/2011   Neuropathy 06/23/2011   Fatigue 05/13/2011   Malaise and fatigue 05/13/2011   Rectal bleeding 04/18/2011   Atrial flutter ([place]) 04/18/2011   Chronic anticoagulation 11/10/2010   PAF (paroxysmal atrial fibrillation) (HCC)    Tobacco abuse    COPD (chronic obstructive pulmonary disease) ([place]) 02/11/2010   Essential hypertension 01/28/2010   OSA (obstructive sleep apnea) 01/28/2010   Past Medical History:  Diagnosis Date   Arthritis    lower back   Asthma    Atrial flutter ([place])    s/p CTI ablation by Dr Rayann Heman   Brain aneurysm 2009   questionable. A follow up CTA in 2009 showed no evidence of   Chronic back pain    DDD/stenosis   Colon polyps    9 polyps removed 0/10/93   Complication of anesthesia September 11, 2012   slow to awaken after ablation   COPD (chronic obstructive pulmonary disease) ([place])    Diabetes mellitus    takes Metformin and Glimepiride daily   Emphysema    GERD (gastroesophageal reflux disease)    takes Omeprazole bid   History of shingles    HLD (hyperlipidemia)    takes Pravastatin daily   HTN (hypertension)    takes Prinizide daily   Obesity    OSA (obstructive sleep apnea)    not always using cpap   Overdose 2009   unintentional Flecanide overdose   Peripheral neuropathy    Short-term memory loss    Tobacco abuse     Family History   Problem Relation Age of Onset   Diabetes Mother    Heart disease Father    Lung cancer Father    Diabetes Maternal Grandmother    Colon cancer Neg Hx    Anesthesia problems Neg Hx    Hypotension Neg Hx    Malignant hyperthermia Neg Hx    Pseudochol deficiency Neg Hx    Esophageal cancer Neg Hx    Pancreatic cancer Neg Hx    Stomach cancer Neg Hx     Past Surgical History:  Procedure Laterality Date   ANTERIOR CERVICAL DECOMP/DISCECTOMY FUSION  01/04/2012   Procedure: ANTERIOR CERVICAL DECOMPRESSION/DISCECTOMY FUSION 1 LEVEL/HARDWARE REMOVAL;  Surgeon: Ophelia Charter, MD;  Location: [facility];  Service: Neurosurgery;  Laterality: N/A;  explore cervical fusion Cervical six - seven  with removal of codman plate  anterior cervical decompression with fusion interbody prothesis plating and bonegraft   APPENDECTOMY  2-58yrs ago   [place] N/A 09/11/2012   PT DID NOT HAVE AN ATRIAL FIBRILLATION ABLATION IN 2014!  ATRIAL FLUTTER ABLATION ONLY   ATRIAL FLUTTER ABLATION  09/11/2012   CTI ablation by Dr Rayann Heman   BIOPSY  11/07/2019   Procedure: BIOPSY;  Surgeon: Milus Banister, MD;  Location: [facility];  Service: Endoscopy;;   CARDIAC CATHETERIZATION  2008   no significant CAD   CARDIOVERSION  05/05/2011   Procedure: CARDIOVERSION;  Surgeon: Lelon Perla, MD;  Location: [facility];  Service: Cardiovascular;  Laterality: N/A;   CARDIOVERSION Bilateral 07/26/2012   Procedure: CARDIOVERSION;  Surgeon: Minus Breeding, MD;  [facility];  Service: Cardiovascular;  Laterality: Bilateral;   CARPAL TUNNEL RELEASE  99/2000   bilateral   COLONOSCOPY WITH PROPOFOL N/A 12/13/2012   Procedure: COLONOSCOPY WITH PROPOFOL;  Surgeon: Milus Banister, MD;  Location: [facility]OS[facility];  Service: Endoscopy;  Laterality: N/A;   ELECTROPHYSIOLOGIC STUDY N/A 04/21/2015   Procedure: Atrial Fibrillation Ablation;  Surgeon: Thompson Grayer, MD;  Location: [facility] CV LAB;  Service:  Cardiovascular;  Laterality: N/A;   ESOPHAGOGASTRODUODENOSCOPY (EGD) WITH PROPOFOL N/A 11/07/2019   Procedure: ESOPHAGOGASTRODUODENOSCOPY (EGD) WITH PROPOFOL;  Surgeon: Milus Banister, MD;  Location: [facility];  Service: Endoscopy;  Laterality: N/A;   HERNIA REPAIR     KNEE SURGERY  6-7yrs ago   left   LEFT HEART CATH AND CORONARY ANGIOGRAPHY N/A 09/19/2016   Procedure: Left Heart Cath and Coronary Angiography;  Surgeon: Belva Crome, MD;  Location: [facility];  Service: Cardiovascular;  Laterality: N/A;   Left inguinal hernia repair     as a child   NASAL SEPTOPLASTY W/ TURBINOPLASTY Bilateral 02/19/2014   Procedure: NASAL SEPTOPLASTY WITH BILATERAL TURBINATE REDUCTION;  Surgeon: Jodi Marble, MD;  Location: [facility];  Service: ENT;  Laterality: Bilateral;   NECK SURGERY  23yrs ago   right shoulder surgery  4-72yrs ago   cyst removed   TEE WITHOUT CARDIOVERSION  05/05/2011   Procedure: TRANSESOPHAGEAL ECHOCARDIOGRAM (TEE);  Surgeon: Lelon Perla, MD;  Location: [facility] ENDOSCOPY;  Service: Cardiovascular;  Laterality: N/A;   TEE WITHOUT CARDIOVERSION N/A 04/21/2015   Procedure: TRANSESOPHAGEAL ECHOCARDIOGRAM (TEE);  Surgeon: Sueanne Margarita, MD;  Location: [facility] ENDOSCOPY;  Service: Cardiovascular;  Laterality: N/A;   THROAT SURGERY  4-61yrs ago   "thought " it was cancer but came back not   TONSILLECTOMY     Social History   Occupational History   Occupation: Dealer   Tobacco Use   Smoking status: Every Day    Packs/day: 1.00    Years: 49.00    Pack years: 49.00    Types: Cigarettes   Smokeless tobacco: Never   Tobacco comments:    1/2 pack per day 10/15/20  Vaping Use   Vaping Use: Never used  Substance and Sexual Activity   Alcohol use: Not Currently    Alcohol/week: 0.0 standard drinks    Comment: 12/05/17 pt stated he has drinked 1/2 drink in 3 years   Drug use: No   Sexual activity: Yes

## 2020-11-05 ENCOUNTER — Ambulatory Visit: Payer: 59 | Admitting: Surgery

## 2020-11-09 ENCOUNTER — Ambulatory Visit (INDEPENDENT_AMBULATORY_CARE_PROVIDER_SITE_OTHER): Payer: 59 | Admitting: Endocrinology

## 2020-11-09 ENCOUNTER — Other Ambulatory Visit: Payer: Self-pay

## 2020-11-09 VITALS — BP 140/68 | HR 77 | Ht 74.0 in | Wt 261.6 lb

## 2020-11-09 DIAGNOSIS — E1122 Type 2 diabetes mellitus with diabetic chronic kidney disease: Secondary | ICD-10-CM

## 2020-11-09 DIAGNOSIS — N1831 Chronic kidney disease, stage 3a: Secondary | ICD-10-CM

## 2020-11-09 DIAGNOSIS — Z794 Long term (current) use of insulin: Secondary | ICD-10-CM

## 2020-11-09 LAB — POCT GLYCOSYLATED HEMOGLOBIN (HGB A1C): Hemoglobin A1C: 7.6 % — AB (ref 4.0–5.6)

## 2020-11-09 MED ORDER — FIASP FLEXTOUCH 100 UNIT/ML ~~LOC~~ SOPN: PEN_INJECTOR | SUBCUTANEOUS | 3 refills | Status: DC

## 2020-11-09 NOTE — Progress Notes (Signed)
Subjective:    Patient ID: [name], male    DOB: Feb 15, 1956, 65 y.o.   MRN: [id number]  HPI Pt returns for f/u of diabetes mellitus:  DM type: Insulin-requiring type 2 Dx'ed: 2683 Complications: stage 3 CRI.   Therapy: insulin since 2019, and metformin DKA: never Severe hypoglycemia: once (2021, when a meal was delayed after taking Novolog) Pancreatitis: never Pancreatic imaging: normal on 2017 CT.  SDOH: he declines insulins other than Novolog. Other: he takes multiple daily injections; he did not tolerate Jardiance (dehydration) or time-release insulins (rash); metformin dosage is limited by abd pain, and Trulicity by nausea Interval history: no cbg record, but states cbg's vary from 80-300.  It is in general lowest in the afternoon.  No recent steroids.  He just resumed using continuous glucose monitor.  Past Medical History:  Diagnosis Date   Arthritis    lower back   Asthma    Atrial flutter [facility])    s/p CTI ablation by Dr Rayann Heman   Brain aneurysm 2009   questionable. A follow up CTA in 2009 showed no evidence of   Chronic back pain    DDD/stenosis   Colon polyps    9 polyps removed 09/01/60   Complication of anesthesia September 11, 2012   slow to awaken after ablation   COPD (chronic obstructive pulmonary disease) ([place])    Diabetes mellitus    takes Metformin and Glimepiride daily   Emphysema    GERD (gastroesophageal reflux disease)    takes Omeprazole bid   History of shingles    HLD (hyperlipidemia)    takes Pravastatin daily   HTN (hypertension)    takes Prinizide daily   Obesity    OSA (obstructive sleep apnea)    not always using cpap   Overdose 2009   unintentional Flecanide overdose   Peripheral neuropathy    Short-term memory loss    Tobacco abuse     Past Surgical History:  Procedure Laterality Date   ANTERIOR CERVICAL DECOMP/DISCECTOMY FUSION  01/04/2012   Procedure: ANTERIOR CERVICAL DECOMPRESSION/DISCECTOMY FUSION 1 LEVEL/HARDWARE REMOVAL;   Surgeon: Ophelia Charter, MD;  Location: [place] [facility]URO ORS;  Service: Neurosurgery;  Laterality: N/A;  explore cervical fusion Cervical six - seven  with removal of codman plate anterior cervical decompression with fusion interbody prothesis plating and bonegraft   APPENDECTOMY  2-21yrs ago   [place] N/A 09/11/2012   PT DID NOT HAVE AN ATRIAL FIBRILLATION ABLATION IN 2014!  ATRIAL FLUTTER ABLATION ONLY   ATRIAL FLUTTER ABLATION  09/11/2012   CTI ablation by Dr Rayann Heman   BIOPSY  11/07/2019   Procedure: BIOPSY;  Surgeon: Milus Banister, MD;  [facility]: [facility];  Service: Endoscopy;;   CARDIAC CATHETERIZATION  2008   no significant CAD   CARDIOVERSION  05/05/2011   Procedure: CARDIOVERSION;  Surgeon: Lelon Perla, MD;  Location: [place];  Service: Cardiovascular;  Laterality: N/A;   CARDIOVERSION Bilateral 07/26/2012   Procedure: CARDIOVERSION;  Surgeon: Minus Breeding, MD;  Location: [facility] ENDOSCOPY;  Service: Cardiovascular;  Laterality: Bilateral;   CARPAL TUNNEL RELEASE  99/2000   bilateral   COLONOSCOPY WITH PROPOFOL N/A 12/13/2012   Procedure: COLONOSCOPY WITH PROPOFOL;  Surgeon: Milus Banister, MD;  [facility];  Service: Endoscopy;  Laterality: N/A;   ELECTROPHYSIOLOGIC STUDY N/A 04/21/2015   Procedure: Atrial Fibrillation Ablation;  Surgeon: Thompson Grayer, MD;  Location: [place] CV LAB;  Service: Cardiovascular;  Laterality: N/A;   ESOPHAGOGASTRODUODENOSCOPY (EGD) WITH  PROPOFOL N/A 11/07/2019   Procedure: ESOPHAGOGASTRODUODENOSCOPY (EGD) WITH PROPOFOL;  Surgeon: Milus Banister, MD;  [facility];  Service: Endoscopy;  Laterality: N/A;   HERNIA REPAIR     KNEE SURGERY  6-30yrs ago   left   LEFT HEART CATH AND CORONARY ANGIOGRAPHY N/A 09/19/2016   Procedure: Left Heart Cath and Coronary Angiography;  Surgeon: Belva Crome, MD;  Location: [place] CV LAB;  Service: Cardiovascular;  Laterality: N/A;   Left inguinal hernia repair     as  a child   NASAL SEPTOPLASTY W/ TURBINOPLASTY Bilateral 02/19/2014   Procedure: NASAL SEPTOPLASTY WITH BILATERAL TURBINATE REDUCTION;  Surgeon: Jodi Marble, MD;  Location: [place];  Service: ENT;  Laterality: Bilateral;   NECK SURGERY  84yrs ago   right shoulder surgery  4-87yrs ago   cyst removed   TEE WITHOUT CARDIOVERSION  05/05/2011   Procedure: TRANSESOPHAGEAL ECHOCARDIOGRAM (TEE);  Surgeon: Lelon Perla, MD;  Location: [facility] ENDOSCOPY;  Service: Cardiovascular;  Laterality: N/A;   TEE WITHOUT CARDIOVERSION N/A 04/21/2015   Procedure: TRANSESOPHAGEAL ECHOCARDIOGRAM (TEE);  Surgeon: Sueanne Margarita, MD;  Location: [facility] ENDOSCOPY;  Service: Cardiovascular;  Laterality: N/A;   THROAT SURGERY  4-68yrs ago   "thought " it was cancer but came back not   TONSILLECTOMY      Social History   Socioeconomic History   Marital status: Married    Spouse name: Not on file   Number of children: 2   Years of education: Not on file   Highest education level: Not on file  Occupational History   Occupation: Dealer   Tobacco Use   Smoking status: Every Day    Packs/day: 1.00    Years: 49.00    Pack years: 49.00    Types: Cigarettes   Smokeless tobacco: Never   Tobacco comments:    1/2 pack per day 10/15/20  Vaping Use   Vaping Use: Never used  Substance and Sexual Activity   Alcohol use: Not Currently    Alcohol/week: 0.0 standard drinks    Comment: 12/05/17 pt stated he has drinked 1/2 drink in 3 years   Drug use: No   Sexual activity: Yes  Other Topics Concern   Not on file  Social History Narrative   Daily caffeine(Mountain Dew)       Lives in [place] with spouse.   Unemployed due to chronic back/ leg pain         Social Determinants of Health   Financial Resource Strain: Not on file  Food Insecurity: Not on file  Transportation Needs: Not on file  Physical Activity: Not on file  Stress: Not on file  Social Connections: Not on file  Intimate Partner Violence: Not on  file    Current Outpatient Medications on File Prior to Visit  Medication Sig Dispense Refill   albuterol (PROAIR HFA) 108 (90 Base) MCG/ACT inhaler Inhale 1-2 puffs into the lungs every 6 (six) hours as needed for wheezing or shortness of breath. 18 g 5   allopurinol (ZYLOPRIM) 300 MG tablet Take 1 tablet (300 mg total) by mouth daily. 90 tablet 3   Ascorbic Acid (VITAMIN C PO) Take 500 mg by mouth daily.      b complex vitamins tablet Take 1 tablet by mouth daily.      bisoprolol (ZEBETA) 10 MG tablet Take 10 mg by mouth daily.      colchicine 0.6 MG tablet Take 1 tablet (0.6 mg total) by mouth 2 (  two) times daily. 10 tablet 1   Continuous Blood Gluc Sensor (DEXCOM G6 SENSOR) MISC 1 Device by Does not apply route See admin instructions. Change every 10 days 9 each 3   diltiazem (CARDIZEM CD) 120 MG 24 hr capsule Take 120 mg by mouth daily.     Dulaglutide (TRULICITY) 1.5 TM/5.4YT SOPN Inject 1.5 mg into the skin weekly 2 mL 1   gabapentin (NEURONTIN) 300 MG capsule Take 300 mg by mouth 2 (two) times daily. Patient takes 1 tablet in morning and 2 tablets in evening     Insulin Pen Needle (B-D UF III MINI PEN NEEDLES) 31G X 5 MM MISC 1 Device by Other route 3 (three) times daily with meals. Use three times daily to inject insulin. 300 each 3   losartan (COZAAR) 50 MG tablet Take 1 tablet (50 mg total) by mouth daily. 90 tablet 3   metFORMIN (GLUCOPHAGE-XR) 500 MG 24 hr tablet Take 1 tablet (500 mg total) by mouth at bedtime. 90 tablet 3   metolazone (ZAROXOLYN) 2.5 MG tablet TAKE 1 TABLET BY MOUTH ON TUESDAY AND THURSDAY 30 MINUTES BEFORE TAKING TORSEMIDE OR AS DIRECTED 15 tablet 3   Multiple Vitamin (MULTIVITAMIN WITH MINERALS) TABS Take 1 tablet by mouth daily.      Multiple Vitamins-Minerals (ZINC PO) Take 50 mg by mouth daily.      nitroGLYCERIN (NITROSTAT) 0.4 MG SL tablet Place 1 tablet (0.4 mg total) under the tongue every 5 (five) minutes x 3 doses as needed for chest pain. 25 tablet 3    omeprazole (PRILOSEC) 20 MG capsule Take 20 mg by mouth 2 (two) times daily.     oxyCODONE-acetaminophen (PERCOCET) 10-325 MG tablet Take 1 tablet by mouth 4 (four) times daily as needed.     potassium chloride SA (KLOR-CON) 20 MEQ tablet TAKE 1 TABLET BY MOUTH DAILY. TAKE AN EXTRA TABLET ON TUESDAY AND THURSDAY 40 tablet 11   pravastatin (PRAVACHOL) 80 MG tablet TAKE 1 TABLET BY MOUTH DAILY AT BEDTIME 30 tablet 10   pregabalin (LYRICA) 75 MG capsule Take 1 capsule (75 mg total) by mouth 2 (two) times daily. 60 capsule 0   torsemide (DEMADEX) 20 MG tablet TAKE 2 TABLETS BY MOUTH 2 TIMES DAILY 360 tablet 2   triamcinolone cream (KENALOG) 0.1 % Apply 1 application topically daily as needed. 453 g 2   umeclidinium-vilanterol (ANORO ELLIPTA) 62.5-25 MCG/INH AEPB Inhale 1 puff into the lungs daily. 60 each 5   VITAMIN A PO Take 2,400 mcg by mouth daily.      Vitamin D, Ergocalciferol, (DRISDOL) 1.25 MG (50000 UT) CAPS capsule Take 50,000 Units by mouth every 7 (seven) days.      warfarin (COUMADIN) 5 MG tablet TAKE 1 TO 1 AND 1/2 TABLETS BY MOUTH DAILY AS DIRECTED BY THE COUMADIN CLINIC 45 tablet 1   No current facility-administered medications on file prior to visit.    Allergies  Allergen Reactions   Adhesive [Tape]     itching   Latex Itching    When tape is on the skin too long skin gets red & itching    Family History  Problem Relation Age of Onset   Diabetes Mother    Heart disease Father    Lung cancer Father    Diabetes Maternal Grandmother    Colon cancer Neg Hx    Anesthesia problems Neg Hx    Hypotension Neg Hx    Malignant hyperthermia Neg Hx    Pseudochol deficiency  Neg Hx    Esophageal cancer Neg Hx    Pancreatic cancer Neg Hx    Stomach cancer Neg Hx     BP 140/68 (BP Location: Right Arm, Patient Position: Sitting, Cuff Size: Normal)   Pulse 77   Ht 6\' 2"  (1.88 m)   Wt 261 lb 9.6 oz (118.7 kg)   SpO2 97%   BMI 33.59 kg/m    Review of Systems He denies  hypoglycemia    Objective:   Physical Exam GENERAL: no distress Pulses: dorsalis pedis intact bilat.   MSK: no deformity of the feet CV: 1+ bilat leg edema.   Skin:  no ulcer on the feet, but the skin is dry and scaly.  normal temp on the feet.   Neuro: sensation is intact to touch on the feet, but severely decreased from normal.   Ext: there is bilateral onychomycosis of the toenails.    Lab Results  Component Value Date   HGBA1C 7.6 (A) 11/09/2020       Assessment & Plan:  Insulin-requiring type 2 DM: uncontrolled.  The pattern of his cbg's indicates he needs some adjustment in his therapy  Patient Instructions  I have sent a prescription to your pharmacy, to change the Fiasp to 3 times a day (just before each meal), 90-60-90 units. check your blood sugar twice a day.  vary the time of day when you check, between before the 3 meals, and at bedtime.  also check if you have symptoms of your blood sugar being too high or too low.  please keep a record of the readings and bring it to your next appointment here (or you can bring the meter itself).  You can write it on any piece of paper.  please call us sooner if your blood sugar goes below 70, or if you have a lot of readings over 200.   Please come back for a follow-up appointment in 3 months.

## 2020-11-09 NOTE — Patient Instructions (Addendum)
I have sent a prescription to your pharmacy, to change the Fiasp to 3 times a day (just before each meal), 90-60-90 units. check your blood sugar twice a day.  vary the time of day when you check, between before the 3 meals, and at bedtime.  also check if you have symptoms of your blood sugar being too high or too low.  please keep a record of the readings and bring it to your next appointment here (or you can bring the meter itself).  You can write it on any piece of paper.  please call us sooner if your blood sugar goes below 70, or if you have a lot of readings over 200.   Please come back for a follow-up appointment in 3 months.

## 2020-11-10 ENCOUNTER — Ambulatory Visit (INDEPENDENT_AMBULATORY_CARE_PROVIDER_SITE_OTHER): Payer: 59 | Admitting: Otolaryngology

## 2020-11-10 DIAGNOSIS — R1314 Dysphagia, pharyngoesophageal phase: Secondary | ICD-10-CM

## 2020-11-10 DIAGNOSIS — K219 Gastro-esophageal reflux disease without esophagitis: Secondary | ICD-10-CM

## 2020-11-10 NOTE — Progress Notes (Signed)
HPI: [name] is a 65 y.o. male who returns today for evaluation of dysphagia..  Patient apparently had a long history of dysphagia for a number of years.  He has had previous GI endoscopy.  He has also had bad reflux disease ever since he was a child and is presently taking omeprazole twice daily.  On top of this he has COPD and continues to smoke about a pack a day.  He has had no weight loss but describes food occasionally gets stuck in his throat.  Denies sore throat.  Past Medical History:  Diagnosis Date   Arthritis    lower back   Asthma    Atrial flutter [facility])    s/p CTI ablation by Dr Rayann Heman   Brain aneurysm 2009   questionable. A follow up CTA in 2009 showed no evidence of   Chronic back pain    DDD/stenosis   Colon polyps    9 polyps removed 7/98/92   Complication of anesthesia September 11, 2012   slow to awaken after ablation   COPD (chronic obstructive pulmonary disease) ([place])    Diabetes mellitus    takes Metformin and Glimepiride daily   Emphysema    GERD (gastroesophageal reflux disease)    takes Omeprazole bid   History of shingles    HLD (hyperlipidemia)    takes Pravastatin daily   HTN (hypertension)    takes Prinizide daily   Obesity    OSA (obstructive sleep apnea)    not always using cpap   Overdose 2009   unintentional Flecanide overdose   Peripheral neuropathy    Short-term memory loss    Tobacco abuse    Past Surgical History:  Procedure Laterality Date   ANTERIOR CERVICAL DECOMP/DISCECTOMY FUSION  01/04/2012   Procedure: ANTERIOR CERVICAL DECOMPRESSION/DISCECTOMY FUSION 1 LEVEL/HARDWARE REMOVAL;  Surgeon: Ophelia Charter, MD;  Location: [place] NEURO ORS;  Service: Neurosurgery;  Laterality: N/A;  explore cervical fusion Cervical six - seven  with removal of codman plate anterior cervical decompression with fusion interbody prothesis plating and bonegraft   APPENDECTOMY  2-25yrs ago   [place] N/A 09/11/2012   PT DID NOT HAVE AN  ATRIAL FIBRILLATION ABLATION IN 2014!  ATRIAL FLUTTER ABLATION ONLY   ATRIAL FLUTTER ABLATION  09/11/2012   CTI ablation by Dr Rayann Heman   BIOPSY  11/07/2019   Procedure: BIOPSY;  Surgeon: Milus Banister, MD;  [facility];  Service: Endoscopy;;   CARDIAC CATHETERIZATION  2008   no significant CAD   CARDIOVERSION  05/05/2011   Procedure: CARDIOVERSION;  Surgeon: Lelon Perla, MD;  Location: [place];  Service: Cardiovascular;  Laterality: N/A;   CARDIOVERSION Bilateral 07/26/2012   Procedure: CARDIOVERSION;  Surgeon: Minus Breeding, MD;  Location: [facility] ENDOSCOPY;  Service: Cardiovascular;  Laterality: Bilateral;   CARPAL TUNNEL RELEASE  99/2000   bilateral   COLONOSCOPY WITH PROPOFOL N/A 12/13/2012   Procedure: COLONOSCOPY WITH PROPOFOL;  Surgeon: Milus Banister, MD;  [facility];  Service: Endoscopy;  Laterality: N/A;   ELECTROPHYSIOLOGIC STUDY N/A 04/21/2015   Procedure: Atrial Fibrillation Ablation;  Surgeon: Thompson Grayer, MD;  Location: [place] CV LAB;  Service: Cardiovascular;  Laterality: N/A;   ESOPHAGOGASTRODUODENOSCOPY (EGD) WITH PROPOFOL N/A 11/07/2019   Procedure: ESOPHAGOGASTRODUODENOSCOPY (EGD) WITH PROPOFOL;  Surgeon: Milus Banister, MD;  [facility];  Service: Endoscopy;  Laterality: N/A;   HERNIA REPAIR     KNEE SURGERY  6-51yrs ago   left   LEFT HEART  CATH AND CORONARY ANGIOGRAPHY N/A 09/19/2016   Procedure: Left Heart Cath and Coronary Angiography;  Surgeon: Belva Crome, MD;  Location: [place] CV LAB;  Service: Cardiovascular;  Laterality: N/A;   Left inguinal hernia repair     as a child   NASAL SEPTOPLASTY W/ TURBINOPLASTY Bilateral 02/19/2014   Procedure: NASAL SEPTOPLASTY WITH BILATERAL TURBINATE REDUCTION;  Surgeon: Jodi Marble, MD;  Location: [place];  Service: ENT;  Laterality: Bilateral;   NECK SURGERY  70yrs ago   right shoulder surgery  4-18yrs ago   cyst removed   TEE WITHOUT CARDIOVERSION  05/05/2011   Procedure:  TRANSESOPHAGEAL ECHOCARDIOGRAM (TEE);  Surgeon: Lelon Perla, MD;  Location: [facility] ENDOSCOPY;  Service: Cardiovascular;  Laterality: N/A;   TEE WITHOUT CARDIOVERSION N/A 04/21/2015   Procedure: TRANSESOPHAGEAL ECHOCARDIOGRAM (TEE);  Surgeon: Sueanne Margarita, MD;  Location: [facility] ENDOSCOPY;  Service: Cardiovascular;  Laterality: N/A;   THROAT SURGERY  4-52yrs ago   "thought " it was cancer but came back not   TONSILLECTOMY     Social History   Socioeconomic History   Marital status: Married    Spouse name: Not on file   Number of children: 2   Years of education: Not on file   Highest education level: Not on file  Occupational History   Occupation: Dealer   Tobacco Use   Smoking status: Every Day    Packs/day: 1.00    Years: 49.00    Pack years: 49.00    Types: Cigarettes   Smokeless tobacco: Never   Tobacco comments:    1/2 pack per day 10/15/20  Vaping Use   Vaping Use: Never used  Substance and Sexual Activity   Alcohol use: Not Currently    Alcohol/week: 0.0 standard drinks    Comment: 12/05/17 pt stated he has drinked 1/2 drink in 3 years   Drug use: No   Sexual activity: Yes  Other Topics Concern   Not on file  Social History Narrative   Daily caffeine(Mountain Dew)       Lives in [place] with spouse.   Unemployed due to chronic back/ leg pain         Social Determinants of Health   Financial Resource Strain: Not on file  Food Insecurity: Not on file  Transportation Needs: Not on file  Physical Activity: Not on file  Stress: Not on file  Social Connections: Not on file   Family History  Problem Relation Age of Onset   Diabetes Mother    Heart disease Father    Lung cancer Father    Diabetes Maternal Grandmother    Colon cancer Neg Hx    Anesthesia problems Neg Hx    Hypotension Neg Hx    Malignant hyperthermia Neg Hx    Pseudochol deficiency Neg Hx    Esophageal cancer Neg Hx    Pancreatic cancer Neg Hx    Stomach cancer Neg Hx    Allergies   Allergen Reactions   Adhesive [Tape]     itching   Latex Itching    When tape is on the skin too long skin gets red & itching   Prior to Admission medications   Medication Sig Start Date End Date Taking? Authorizing Provider  albuterol (PROAIR HFA) 108 (90 Base) MCG/ACT inhaler Inhale 1-2 puffs into the lungs every 6 (six) hours as needed for wheezing or shortness of breath. 10/15/20   Parrett, Fonnie Mu, NP  allopurinol (ZYLOPRIM) 300 MG tablet Take 1  tablet (300 mg total) by mouth daily. 10/29/20   Jessy Oto, MD  Ascorbic Acid (VITAMIN C PO) Take 500 mg by mouth daily.     [provider]  b complex vitamins tablet Take 1 tablet by mouth daily.     [provider]  bisoprolol (ZEBETA) 10 MG tablet Take 10 mg by mouth daily.     [provider]  colchicine 0.6 MG tablet Take 1 tablet (0.6 mg total) by mouth 2 (two) times daily. 10/29/20   Jessy Oto, MD  Continuous Blood Gluc Sensor (DEXCOM G6 SENSOR) MISC 1 Device by Does not apply route See admin instructions. Change every 10 days 10/12/20   Renato Shin, MD  diltiazem (CARDIZEM CD) 120 MG 24 hr capsule Take 120 mg by mouth daily. 07/18/20   [provider]  Dulaglutide (TRULICITY) 1.5 OE/4.2PN SOPN Inject 1.5 mg into the skin weekly 09/04/20   Elayne Snare, MD  gabapentin (NEURONTIN) 300 MG capsule Take 300 mg by mouth 2 (two) times daily. Patient takes 1 tablet in morning and 2 tablets in evening    [provider]  insulin aspart (FIASP FLEXTOUCH) 100 UNIT/ML FlexTouch Pen 3 times a day (just before each meal) 90-60-90 units. 11/09/20   Renato Shin, MD  Insulin Pen Needle (B-D UF III MINI PEN NEEDLES) 31G X 5 MM MISC 1 Device by Other route 3 (three) times daily with meals. Use three times daily to inject insulin. 06/17/20   Renato Shin, MD  losartan (COZAAR) 50 MG tablet Take 1 tablet (50 mg total) by mouth daily. 04/11/18   Minus Breeding, MD  metFORMIN (GLUCOPHAGE-XR) 500 MG 24 hr tablet  Take 1 tablet (500 mg total) by mouth at bedtime. 12/10/19   Renato Shin, MD  metolazone (ZAROXOLYN) 2.5 MG tablet TAKE 1 TABLET BY MOUTH ON TUESDAY AND THURSDAY 30 MINUTES BEFORE TAKING TORSEMIDE OR AS DIRECTED 05/19/20   Minus Breeding, MD  Multiple Vitamin (MULTIVITAMIN WITH MINERALS) TABS Take 1 tablet by mouth daily.     [provider]  Multiple Vitamins-Minerals (ZINC PO) Take 50 mg by mouth daily.     [provider]  nitroGLYCERIN (NITROSTAT) 0.4 MG SL tablet Place 1 tablet (0.4 mg total) under the tongue every 5 (five) minutes x 3 doses as needed for chest pain. 08/28/16   Theora Gianotti, NP  omeprazole (PRILOSEC) 20 MG capsule Take 20 mg by mouth 2 (two) times daily. 06/25/20   [provider]  oxyCODONE-acetaminophen (PERCOCET) 10-325 MG tablet Take 1 tablet by mouth 4 (four) times daily as needed. 03/10/20   [provider]  potassium chloride SA (KLOR-CON) 20 MEQ tablet TAKE 1 TABLET BY MOUTH DAILY. TAKE AN EXTRA TABLET ON TUESDAY AND THURSDAY 02/18/20   Sande Rives E, PA-C  pravastatin (PRAVACHOL) 80 MG tablet TAKE 1 TABLET BY MOUTH DAILY AT BEDTIME 04/04/18   Minus Breeding, MD  pregabalin (LYRICA) 75 MG capsule Take 1 capsule (75 mg total) by mouth 2 (two) times daily. 09/24/20   Jessy Oto, MD  torsemide (DEMADEX) 20 MG tablet TAKE 2 TABLETS BY MOUTH 2 TIMES DAILY 09/14/20   Minus Breeding, MD  triamcinolone cream (KENALOG) 0.1 % Apply 1 application topically daily as needed. 12/19/19   Clark-Burning, Anderson Malta, PA-C  umeclidinium-vilanterol (ANORO ELLIPTA) 62.5-25 MCG/INH AEPB Inhale 1 puff into the lungs daily. 10/15/20   Parrett, Fonnie Mu, NP  VITAMIN A PO Take 2,400 mcg by mouth daily.     [provider]  Vitamin D, Ergocalciferol, (DRISDOL) 1.25 MG (50000 UT) CAPS capsule Take 50,000 Units by mouth every 7 (seven) days.     [provider]  warfarin (COUMADIN) 5 MG tablet TAKE 1 TO 1 AND 1/2 TABLETS BY MOUTH DAILY  AS DIRECTED BY THE COUMADIN CLINIC 09/14/20   Minus Breeding, MD     Positive ROS: Otherwise negative   All other systems have been reviewed and were otherwise negative with the exception of those mentioned in the HPI and as above.  Physical Exam: Constitutional: Alert, well-appearing, no acute distress Ears: External ears without lesions or tenderness. Ear canals are clear bilaterally with intact, clear TMs.   Nasal: External nose without lesions. Septum with mild deformity and moderate rhinitis.. Clear nasal passages otherwise. Oral: Lips and gums without lesions. Tongue and palate mucosa are clear.  Of note patient has a small papilloma on the distal end of the uvula but this is benign appearance posterior and not obstructing.  Posterior oropharyngeal wall is normal. Indirect laryngoscopy revealed a clear base of tongue vallecula epiglottis.  Vocal cords were clear bilaterally with normal vocal mobility.  He has moderate edema of the arytenoid mucosa consistent with reflux disease but no mucosal abnormalities noted.  Both piriform sinuses were clear. Neck: No palpable adenopathy or masses.  No significant palpable adenopathy on either side of the neck. Respiratory: Breathing comfortably but does have congested or COPD type cough. Skin: No facial/neck lesions or rash noted.  Procedures  Assessment: Chronic dysphagia most likely related to chronic GE reflux disease.  Would recommend further evaluation and treatment with GI if needed. Benign papilloma of the uvula  Plan: Discussed with him concerning clear hypopharynx and larynx with no evidence of active infection or neoplasm.  I suspect a lot of his symptoms are related to chronic GE reflux disease. I also strongly recommended that he try to quit smoking as this contributes to symptoms.Radene Journey, MD

## 2020-11-19 ENCOUNTER — Ambulatory Visit: Payer: 59 | Admitting: Endocrinology

## 2020-11-26 ENCOUNTER — Telehealth: Payer: Self-pay | Admitting: Podiatry

## 2020-11-26 ENCOUNTER — Encounter: Payer: Self-pay | Admitting: Podiatry

## 2020-11-26 NOTE — Telephone Encounter (Signed)
Called patient lvm to reschedule 8/24 appt and sent letter

## 2020-11-27 ENCOUNTER — Ambulatory Visit (INDEPENDENT_AMBULATORY_CARE_PROVIDER_SITE_OTHER): Payer: 59

## 2020-11-27 ENCOUNTER — Other Ambulatory Visit: Payer: Self-pay

## 2020-11-27 ENCOUNTER — Telehealth: Payer: Self-pay | Admitting: Specialist

## 2020-11-27 DIAGNOSIS — I480 Paroxysmal atrial fibrillation: Secondary | ICD-10-CM | POA: Diagnosis not present

## 2020-11-27 DIAGNOSIS — Z5181 Encounter for therapeutic drug level monitoring: Secondary | ICD-10-CM | POA: Diagnosis not present

## 2020-11-27 DIAGNOSIS — Z7901 Long term (current) use of anticoagulants: Secondary | ICD-10-CM

## 2020-11-27 LAB — POCT INR: INR: 2.1 (ref 2.0–3.0)

## 2020-11-27 NOTE — Telephone Encounter (Signed)
Pt having severe pain and swelling for left knee. Pt calling to ask what he can do to soothe the pain until appt coming up on Thursday. The best call back number is [phone].

## 2020-11-27 NOTE — Telephone Encounter (Signed)
Please advise 

## 2020-11-27 NOTE — Patient Instructions (Signed)
continue taking 1 tablet daily except for 1/2 tablet every Monday, Wednesday and Friday. Repeat INR  in 6 weeks.

## 2020-12-01 DIAGNOSIS — M25562 Pain in left knee: Secondary | ICD-10-CM | POA: Diagnosis not present

## 2020-12-01 MED ORDER — LIDOCAINE HCL 1 % IJ SOLN
3.0000 mL | INTRAMUSCULAR | Status: AC | PRN
  Administered 2020-12-01: 3 mL

## 2020-12-01 NOTE — Progress Notes (Signed)
Office Visit Note   Patient: [name]           Date of Birth: June 14, 1955           MRN: [id number] Visit Date: 10/22/2020              Requested by: [name], [place],  [place] [address] PCP: [name], MD   Assessment & Plan: Visit Diagnoses:  1. Chronic pain of left knee   2. Acute pain of left knee   3. Polyarthralgia     Plan: Today offered conservative management with left knee injection/aspiration.  Fingerstick glucose in clinic today 155.  Advised patient that I did not want to perform intra-articular steroid injection due to the potential for to elevate his blood sugars.  I did agree to perform left knee aspiration.  Patient consent left superolateral knee was prepped with Betadine and after using Xylocaine for local anesthetic I aspirated 25 cc of cloudy serous fluid.  Fluid was sent to the lab for routine analysis.  Also did blood work to check a CBC and arthritis panel.  Patient was given crutches.  He will follow-up with me in 2 weeks for recheck.  Patient is on chronic Coumadin so he is not able to use oral NSAIDs.  Follow-Up Instructions: Return in about 2 weeks (around 11/05/2020) for with Caylor Cerino recheck left knee. .   Orders:  Orders Placed This Encounter  Procedures   Gram stain   XR Knee 1-2 Views Left   Cell count + diff,  w/ cryst-synvl fld   CBC with Differential   Sed Rate (ESR)   Uric acid   Antinuclear Antib (ANA)   Rheumatoid Factor   Synovial Fluid Analysis, Complete   Anti-nuclear ab-titer (ANA titer)   No orders of the defined types were placed in this encounter.     Procedures: Large Joint Inj: L knee on 12/01/2020 4:09 PM Indications: pain and joint swelling Details: 22 G 1.5 in needle, superolateral approach Medications: 3 mL lidocaine 1 % Aspirate: 25 mL cloudy and serous     Clinical Data: No additional findings.   Subjective: Chief Complaint  Patient presents with   Left Knee - Pain     HPI 65 year old white male comes in today with complaints of acute left knee pain and swelling.  States that he is status post left knee scope about 10 years ago agrees with orthopedics.  Has had off-and-on knee pain for several years but increased pain in the last 3 to 4 days.  States that this was sudden onset without injury.  Denies history of gout. Review of Systems No current cardiac pulmonary GI GU issues  Objective: Vital Signs: BP 123/65   Pulse 83   Ht _0  (1.88 m)   Wt 260 lb (117.9 kg)   BMI 33.38 kg/m   Physical Exam HENT:     Head: Normocephalic and atraumatic.  Eyes:     Extraocular Movements: Extraocular movements intact.  Pulmonary:     Effort: Pulmonary effort is normal. No respiratory distress.  Musculoskeletal:     Comments: Gait is antalgic.  Left knee diffusely tender.  Range of motion about 0 to 95 degrees.  No signs of infection.  Swelling with small effusion.  Calf nontender.  Neurovascular intact.  Neurological:     Mental Status: He is alert and oriented to person, place, and time.  Psychiatric:        Mood  and Affect: Mood normal.    Ortho Exam  Specialty Comments:  No specialty comments available.  Imaging: No results found.   PMFS History: Patient Active Problem List   Diagnosis Date Noted   Dysphagia 04/17/2020   LVH (left ventricular hypertrophy) 11/07/2019   Heme positive stool 10/03/2019   Anemia 10/03/2019   Insulin dependent type 2 diabetes mellitus ([place]) 08/26/2019   Acute on chronic diastolic heart failure ([place]) 08/26/2019   Rash and nonspecific skin eruption 08/26/2019   Foot pain 08/22/2019   Right heart failure ([place]) 08/07/2019   Leg swelling 07/28/2019   Educated about COVID-19 virus infection 07/28/2019   Diabetes ([place]) 06/12/2019   Urticaria 05/15/2019   Dermatitis due to drug 04/18/2019   Edema 03/29/2019   Erectile dysfunction 03/29/2019   High risk medication use 03/29/2019   History of iron deficiency  03/29/2019   Pulmonary hypertension ([place]) 03/29/2019   Venous stasis dermatitis of both lower extremities 03/29/2019   Mixed hyperlipidemia 02/14/2019   Onychomycosis due to dermatophyte 11/06/2018   Hammer toe of right foot 07/17/2018   Tinea pedis of both feet 07/17/2018   History of radiofrequency ablation (RFA) procedure for cardiac arrhythmia 06/21/2018   Lumbar back pain with radiculopathy affecting lower extremity 06/21/2018   Hepatic cirrhosis ([place]) 12/26/2017   Left lower quadrant pain 12/05/2017   Change in bowel habits 12/05/2017   Palpitations 10/03/2017   SOB (shortness of breath) 04/12/2017   Chest pain with moderate risk of acute coronary syndrome 08/28/2016   Unstable angina (HCC) 08/26/2016   Longstanding persistent atrial fibrillation ([place]) 04/21/2015   Deviated nasal septum 02/19/2014   History of colon polyps 11/22/2012   Syncope 10/17/2012   Acute asthmatic bronchitis 11/22/2011   Benign neoplasm of colon 10/13/2011   Unspecified gastritis and gastroduodenitis without mention of hemorrhage 10/13/2011   GERD (gastroesophageal reflux disease) 10/13/2011   Obesity (BMI 30-39.9) 09/30/2011   Dyslipidemia 06/23/2011   Neuropathy 06/23/2011   Fatigue 05/13/2011   Malaise and fatigue 05/13/2011   Rectal bleeding 04/18/2011   Atrial flutter ([place]) 04/18/2011   Chronic anticoagulation 11/10/2010   PAF (paroxysmal atrial fibrillation) (HCC)    Tobacco abuse    COPD (chronic obstructive pulmonary disease) ([place]) 02/11/2010   Essential hypertension 01/28/2010   OSA (obstructive sleep apnea) 01/28/2010   Past Medical History:  Diagnosis Date   Arthritis    lower back   Asthma    Atrial flutter ([place])    s/p CTI ablation by Dr Rayann Heman   Brain aneurysm 2009   questionable. A follow up CTA in 2009 showed no evidence of   Chronic back pain    DDD/stenosis   Colon polyps    9 polyps removed 11/11/34   Complication of anesthesia September 11, 2012   slow to awaken after  ablation   COPD (chronic obstructive pulmonary disease) ([place])    Diabetes mellitus    takes Metformin and Glimepiride daily   Emphysema    GERD (gastroesophageal reflux disease)    takes Omeprazole bid   History of shingles    HLD (hyperlipidemia)    takes Pravastatin daily   HTN (hypertension)    takes Prinizide daily   Obesity    OSA (obstructive sleep apnea)    not always using cpap   Overdose 2009   unintentional Flecanide overdose   Peripheral neuropathy    Short-term memory loss    Tobacco abuse     Family History  Problem Relation Age of Onset  Diabetes Mother    Heart disease Father    Lung cancer Father    Diabetes Maternal Grandmother    Colon cancer Neg Hx    Anesthesia problems Neg Hx    Hypotension Neg Hx    Malignant hyperthermia Neg Hx    Pseudochol deficiency Neg Hx    Esophageal cancer Neg Hx    Pancreatic cancer Neg Hx    Stomach cancer Neg Hx     Past Surgical History:  Procedure Laterality Date   ANTERIOR CERVICAL DECOMP/DISCECTOMY FUSION  01/04/2012   Procedure: ANTERIOR CERVICAL DECOMPRESSION/DISCECTOMY FUSION 1 LEVEL/HARDWARE REMOVAL;  Surgeon: Ophelia Charter, MD;  Location: [facility];  Service: Neurosurgery;  Laterality: N/A;  explore cervical fusion Cervical six - seven  with removal of codman plate anterior cervical decompression with fusion interbody prothesis plating and bonegraft   APPENDECTOMY  2-57yr ago   AHalfwayN/A 09/11/2012   PT DID NOT HAVE AN ATRIAL FIBRILLATION ABLATION IN 2014!  ATRIAL FLUTTER ABLATION ONLY   ATRIAL FLUTTER ABLATION  09/11/2012   CTI ablation by Dr ARayann Heman  BIOPSY  11/07/2019   Procedure: BIOPSY;  Surgeon: JMilus Banister MD;  [facility];  Service: Endoscopy;;   CARDIAC CATHETERIZATION  2008   no significant CAD   CARDIOVERSION  05/05/2011   Procedure: CARDIOVERSION;  Surgeon: BLelon Perla MD;  [facility] [facility]  Service: Cardiovascular;  Laterality: N/A;    CARDIOVERSION Bilateral 07/26/2012   Procedure: CARDIOVERSION;  Surgeon: JMinus Breeding MD;  [facility];  Service: Cardiovascular;  Laterality: Bilateral;   CARPAL TUNNEL RELEASE  99/2000   bilateral   COLONOSCOPY WITH PROPOFOL N/A 12/13/2012   Procedure: COLONOSCOPY WITH PROPOFOL;  Surgeon: DMilus Banister MD;  [facility];  Service: Endoscopy;  Laterality: N/A;   ELECTROPHYSIOLOGIC STUDY N/A 04/21/2015   Procedure: Atrial Fibrillation Ablation;  Surgeon: JThompson Grayer MD;  [place];  Service: Cardiovascular;  Laterality: N/A;   ESOPHAGOGASTRODUODENOSCOPY (EGD) WITH PROPOFOL N/A 11/07/2019   Procedure: ESOPHAGOGASTRODUODENOSCOPY (EGD) WITH PROPOFOL;  Surgeon: JMilus Banister MD;  [facility];  Service: Endoscopy;  Laterality: N/A;   HERNIA REPAIR     KNEE SURGERY  6-751yrago   left   LEFT HEART CATH AND CORONARY ANGIOGRAPHY N/A 09/19/2016   Procedure: Left Heart Cath and Coronary Angiography;  Surgeon: SmBelva CromeMD;  [facility];  Service: Cardiovascular;  Laterality: N/A;   Left inguinal hernia repair     as a child   NASAL SEPTOPLASTY W/ TURBINOPLASTY Bilateral 02/19/2014   Procedure: NASAL SEPTOPLASTY WITH BILATERAL TURBINATE REDUCTION;  Surgeon: KaJodi MarbleMD;  [facility] Service: ENT;  Laterality: Bilateral;   NECK SURGERY  1766yrgo   right shoulder surgery  [id number]   cyst removed   TEE WITHOUT CARDIOVERSION  05/05/2011   Procedure: TRANSESOPHAGEAL ECHOCARDIOGRAM (TEE);  Surgeon: BriaLelon Perla;  [facility];  Service: Cardiovascular;  Laterality: N/A;   TEE WITHOUT CARDIOVERSION N/A 04/21/2015   Procedure: TRANSESOPHAGEAL ECHOCARDIOGRAM (TEE);  Surgeon: TracSueanne Margarita;  [facility];  Service: Cardiovascular;  Laterality: N/A;   THROAT SURGERY  4-53yrs20yr   "thought " it was cancer but came back not   TONSILLECTOMY     Social History   Occupational History    Occupation: [employer] Use   Smoking status: Every Day    Packs/day: 1.00  Years: 49.00    Pack years: 49.00    Types: Cigarettes   Smokeless tobacco: Never   Tobacco comments:    1/2 pack per day 10/15/20  Vaping Use   Vaping Use: Never used  Substance and Sexual Activity   Alcohol use: Not Currently    Alcohol/week: 0.0 standard drinks    Comment: 12/05/17 pt stated he has drinked 1/2 drink in 3 years   Drug use: No   Sexual activity: Yes

## 2020-12-02 NOTE — Telephone Encounter (Signed)
I called and talked to pt. He said knee is better now but was wondering about pain management. I saw pt was denied. Can you look into for me?

## 2020-12-04 NOTE — Telephone Encounter (Signed)
Referral faxed to [facility] pain clinic, they will contact pt to schedule appt

## 2020-12-04 NOTE — Telephone Encounter (Signed)
Pt was denied by [facility] pain management with [facility], I can send to a different pain clinic. Will try to send to [facility] if pt is agreeable to this. I will contact pt myself.

## 2020-12-10 ENCOUNTER — Ambulatory Visit: Payer: 59 | Admitting: Specialist

## 2020-12-18 ENCOUNTER — Other Ambulatory Visit: Payer: Self-pay | Admitting: Endocrinology

## 2020-12-28 ENCOUNTER — Ambulatory Visit (INDEPENDENT_AMBULATORY_CARE_PROVIDER_SITE_OTHER): Payer: Self-pay | Admitting: Nurse Practitioner

## 2020-12-28 ENCOUNTER — Encounter: Payer: Self-pay | Admitting: Nurse Practitioner

## 2020-12-28 ENCOUNTER — Other Ambulatory Visit: Payer: Self-pay

## 2020-12-28 ENCOUNTER — Telehealth: Payer: Self-pay | Admitting: Cardiology

## 2020-12-28 VITALS — BP 106/50 | HR 74 | Temp 97.3°F | Ht 74.0 in | Wt 257.1 lb

## 2020-12-28 DIAGNOSIS — G894 Chronic pain syndrome: Secondary | ICD-10-CM

## 2020-12-28 NOTE — Telephone Encounter (Signed)
Spoke to patient he stated he has been having weakness and sob,chest pain for the past 2 weeks.No chest pain or sob at present.Appointment scheduled with Dr.Hochrein 8/24 at 9:30 am.Advised if he has any more chest pain and sob he will need to go to ED to be evaluated.

## 2020-12-28 NOTE — Telephone Encounter (Signed)
   STAT if patient feels like he/she is going to faint   Are you dizzy now? no  Do you feel faint or have you passed out? no  Do you have any other symptoms? Weak, SOB  Have you checked your HR and BP (record if available)? 12/28/20:  102/50 (Patient went to his PCP today )  Pt c/o Shortness Of Breath: STAT if SOB developed within the last 24 hours or pt is noticeably SOB on the phone  1. Are you currently SOB (can you hear that pt is SOB on the phone)? No. Pt is sitting down  2. How long have you been experiencing SOB? On and off for a while  3. Are you SOB when sitting or when up moving around? Up and moving around   4. Are you currently experiencing any other symptoms? Weakness

## 2020-12-28 NOTE — Progress Notes (Deleted)
Established Patient Office Visit  Subjective:  Patient ID: [name], male    DOB: January 22, 1956  Age: 65 y.o. MRN: [id number]  CC: No chief complaint on file.   HPI FAIZAAN FALLS presents for ***  Past Medical History:  Diagnosis Date   Arthritis    lower back   Asthma    Atrial flutter [facility])    s/p CTI ablation by Dr Rayann Heman   Brain aneurysm 2009   questionable. A follow up CTA in 2009 showed no evidence of   Chronic back pain    DDD/stenosis   Colon polyps    9 polyps removed 08/23/79   Complication of anesthesia September 11, 2012   slow to awaken after ablation   COPD (chronic obstructive pulmonary disease) ([place])    Diabetes mellitus    takes Metformin and Glimepiride daily   Emphysema    GERD (gastroesophageal reflux disease)    takes Omeprazole bid   History of shingles    HLD (hyperlipidemia)    takes Pravastatin daily   HTN (hypertension)    takes Prinizide daily   Obesity    OSA (obstructive sleep apnea)    not always using cpap   Overdose 2009   unintentional Flecanide overdose   Peripheral neuropathy    Short-term memory loss    Tobacco abuse     Past Surgical History:  Procedure Laterality Date   ANTERIOR CERVICAL DECOMP/DISCECTOMY FUSION  01/04/2012   Procedure: ANTERIOR CERVICAL DECOMPRESSION/DISCECTOMY FUSION 1 LEVEL/HARDWARE REMOVAL;  Surgeon: Ophelia Charter, MD;  Location: [place] [facility];  Service: Neurosurgery;  Laterality: N/A;  explore cervical fusion Cervical six - seven  with removal of codman plate anterior cervical decompression with fusion interbody prothesis plating and bonegraft   APPENDECTOMY  2-92yr ago   ACoplayN/A 09/11/2012   PT DID NOT HAVE AN ATRIAL FIBRILLATION ABLATION IN 2014!  ATRIAL FLUTTER ABLATION ONLY   ATRIAL FLUTTER ABLATION  09/11/2012   CTI ablation by Dr ARayann Heman  BIOPSY  11/07/2019   Procedure: BIOPSY;  Surgeon: JMilus Banister MD;  [facility];  Service: Endoscopy;;   CARDIAC  CATHETERIZATION  2008   no significant CAD   CARDIOVERSION  05/05/2011   Procedure: CARDIOVERSION;  Surgeon: BLelon Perla MD;  [place] [facility]  Service: Cardiovascular;  Laterality: N/A;   CARDIOVERSION Bilateral 07/26/2012   Procedure: CARDIOVERSION;  Surgeon: JMinus Breeding MD;  [facility];  Service: Cardiovascular;  Laterality: Bilateral;   CARPAL TUNNEL RELEASE  99/2000   bilateral   COLONOSCOPY WITH PROPOFOL N/A 12/13/2012   Procedure: COLONOSCOPY WITH PROPOFOL;  Surgeon: DMilus Banister MD;  [facility];  Service: Endoscopy;  Laterality: N/A;   ELECTROPHYSIOLOGIC STUDY N/A 04/21/2015   Procedure: Atrial Fibrillation Ablation;  Surgeon: JThompson Grayer MD;  [facility];  Service: Cardiovascular;  Laterality: N/A;   ESOPHAGOGASTRODUODENOSCOPY (EGD) WITH PROPOFOL N/A 11/07/2019   Procedure: ESOPHAGOGASTRODUODENOSCOPY (EGD) WITH PROPOFOL;  Surgeon: JMilus Banister MD;  [facility];  Service: Endoscopy;  Laterality: N/A;   HERNIA REPAIR     KNEE SURGERY  6-752yrago   left   LEFT HEART CATH AND CORONARY ANGIOGRAPHY N/A 09/19/2016   Procedure: Left Heart Cath and Coronary Angiography;  Surgeon: SmBelva CromeMD;  [facility];  Service: Cardiovascular;  Laterality: N/A;   Left inguinal hernia repair     as a child   NASAL SEPTOPLASTY W/ TURBINOPLASTY Bilateral  02/19/2014   Procedure: NASAL SEPTOPLASTY WITH BILATERAL TURBINATE REDUCTION;  Surgeon: Jodi Marble, MD;  Location: [place];  Service: ENT;  Laterality: Bilateral;   NECK SURGERY  103yr ago   right shoulder surgery  4-550yrago   cyst removed   TEE WITHOUT CARDIOVERSION  05/05/2011   Procedure: TRANSESOPHAGEAL ECHOCARDIOGRAM (TEE);  Surgeon: BrLelon PerlaMD;  [facility];  Service: Cardiovascular;  Laterality: N/A;   TEE WITHOUT CARDIOVERSION N/A 04/21/2015   Procedure: TRANSESOPHAGEAL ECHOCARDIOGRAM (TEE);  Surgeon: TrSueanne MargaritaMD;   [facility];  Service: Cardiovascular;  Laterality: N/A;   THROAT SURGERY  4-5y72yrgo   "thought " it was cancer but came back not   TONSILLECTOMY      Family History  Problem Relation Age of Onset   Diabetes Mother    Heart disease Father    Lung cancer Father    Diabetes Maternal Grandmother    Colon cancer Neg Hx    Anesthesia problems Neg Hx    Hypotension Neg Hx    Malignant hyperthermia Neg Hx    Pseudochol deficiency Neg Hx    Esophageal cancer Neg Hx    Pancreatic cancer Neg Hx    Stomach cancer Neg Hx     Social History   Socioeconomic History   Marital status: Married    Spouse name: Not on file   Number of children: 2   Years of education: Not on file   Highest education level: Not on file  Occupational History   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   Unemployed due to chronic back/ leg pain         Social Determinants of Health   Financial Resource Strain: Not on file  Food Insecurity: Not on file  Transportation Needs: Not on file  Physical Activity: Not on file  Stress: Not on file  Social Connections: Not on file  Intimate Partner Violence: Not on file    Outpatient Medications Prior to Visit  Medication Sig Dispense Refill   albuterol (PROAIR HFA) 108 (90 Base) MCG/ACT inhaler Inhale 1-2 puffs into the lungs every 6 (six) hours as needed for wheezing or shortness of breath. 18  g 5   allopurinol (ZYLOPRIM) 300 MG tablet Take 1 tablet (300 mg total) by mouth daily. 90 tablet 3   Ascorbic Acid (VITAMIN C PO) Take 500 mg by mouth daily.      b complex vitamins tablet Take 1 tablet by mouth daily.      bisoprolol (ZEBETA) 10 MG tablet Take 10 mg by mouth daily.      colchicine 0.6 MG tablet Take 1 tablet (0.6 mg total) by mouth 2 (two) times daily. 10 tablet 1   Continuous Blood Gluc Sensor (DEXCOM  G6 SENSOR) MISC 1 Device by Does not apply route See admin instructions. Change every 10 days 9 each 3   diltiazem (CARDIZEM CD) 120 MG 24 hr capsule Take 120 mg by mouth daily.     Dulaglutide (TRULICITY) 1.5 QP/5.9FM SOPN Inject 1.5 mg into the skin weekly 2 mL 1   gabapentin (NEURONTIN) 300 MG capsule Take 300 mg by mouth 2 (two) times daily. Patient takes 1 tablet in morning and 2 tablets in evening     insulin aspart (FIASP FLEXTOUCH) 100 UNIT/ML FlexTouch Pen 3 times a day (just before each meal) 90-60-90 units. 240 mL 3   Insulin Pen Needle (B-D UF III MINI PEN NEEDLES) 31G X 5 MM MISC 1 Device by Other route 3 (three) times daily with meals. Use three times daily to inject insulin. 300 each 3   losartan (COZAAR) 50 MG tablet Take 1 tablet (50 mg total) by mouth daily. 90 tablet 3   metFORMIN (GLUCOPHAGE-XR) 500 MG 24 hr tablet TAKE 1 TABLET BY MOUTH EACH NIGHT AT BEDTIME 90 tablet 3   metolazone (ZAROXOLYN) 2.5 MG tablet TAKE 1 TABLET BY MOUTH ON TUESDAY AND THURSDAY 30 MINUTES BEFORE TAKING TORSEMIDE OR AS DIRECTED 15 tablet 3   Multiple Vitamin (MULTIVITAMIN WITH MINERALS) TABS Take 1 tablet by mouth daily.      Multiple Vitamins-Minerals (ZINC PO) Take 50 mg by mouth daily.      nitroGLYCERIN (NITROSTAT) 0.4 MG SL tablet Place 1 tablet (0.4 mg total) under the tongue every 5 (five) minutes x 3 doses as needed for chest pain. 25 tablet 3   omeprazole (PRILOSEC) 20 MG capsule Take 20 mg by mouth 2 (two) times daily.     oxyCODONE-acetaminophen (PERCOCET) 10-325 MG tablet  Take 1 tablet by mouth 4 (four) times daily as needed.     potassium chloride SA (KLOR-CON) 20 MEQ tablet TAKE 1 TABLET BY MOUTH DAILY. TAKE AN EXTRA TABLET ON TUESDAY AND THURSDAY 40 tablet 11   pravastatin (PRAVACHOL) 80 MG tablet TAKE 1 TABLET BY MOUTH DAILY AT BEDTIME 30 tablet 10   pregabalin (LYRICA) 75 MG capsule Take 1 capsule (75 mg total) by mouth 2 (two) times daily. 60 capsule 0   torsemide (DEMADEX) 20 MG tablet TAKE 2 TABLETS BY MOUTH 2 TIMES DAILY 360 tablet 2   triamcinolone cream (KENALOG) 0.1 % Apply 1 application topically daily as needed. 453 g 2   umeclidinium-vilanterol (ANORO ELLIPTA) 62.5-25 MCG/INH AEPB Inhale 1 puff into the lungs daily. 60 each 5   VITAMIN A PO Take 2,400 mcg by mouth daily.      Vitamin D, Ergocalciferol, (DRISDOL) 1.25 MG (50000 UT) CAPS capsule Take 50,000 Units by mouth every 7 (seven) days.      warfarin (COUMADIN) 5 MG tablet TAKE 1 TO 1 AND 1/2 TABLETS BY MOUTH DAILY AS DIRECTED BY THE COUMADIN CLINIC 45 tablet 1   No facility-administered medications prior to visit.    Allergies  Allergen Reactions   Adhesive [Tape]     itching   Latex Itching    When tape is on the skin too long skin gets red & itching    ROS Review of Systems    Objective:    Physical Exam  There were no vitals taken for this visit. Wt Readings from Last 3 Encounters:  11/09/20 261 lb 9.6 oz (118.7 kg)  10/29/20 260 lb (117.9 kg)  10/22/20 260 lb (117.9 kg)     Health Maintenance Due  Topic Date Due   COVID-19 Vaccine (1) Never done   OPHTHALMOLOGY EXAM  Never done   TETANUS/TDAP  Never done   Zoster Vaccines- Shingrix (1 of 2) Never done   Pneumococcal Vaccine 38-73 Years old (2 - PCV) 02/14/2008   INFLUENZA VACCINE  12/14/2020    There are no preventive care reminders to display for this patient.  Lab Results  Component Value Date   TSH 4.330 07/29/2019   Lab Results  Component Value Date   WBC 5.6 10/22/2020   HGB 10.4 (L) 10/22/2020    HCT 32.3 (L) 10/22/2020   MCV 91.5 10/22/2020   PLT 129 (L) 10/22/2020   Lab Results  Component Value Date   NA 142 09/16/2020   K 3.5 09/16/2020   CO2 29 09/16/2020   GLUCOSE 143 (H) 09/16/2020   BUN 21 09/16/2020   CREATININE 1.17 09/16/2020   BILITOT 0.5 04/07/2020   ALKPHOS 125 (H) 04/07/2020   AST 38 (H) 04/07/2020   ALT 25 04/07/2020   PROT 7.3 04/07/2020   ALBUMIN 4.0 04/07/2020   CALCIUM 9.5 09/16/2020   ANIONGAP 14 05/04/2020   EGFR 70 09/16/2020   GFR 55.82 (L) 04/07/2020   Lab Results  Component Value Date   CHOL 122 08/18/2017   Lab Results  Component Value Date   HDL 29 (L) 08/18/2017   Lab Results  Component Value Date   LDLCALC 13 08/18/2017   Lab Results  Component Value Date   TRIG 400 (H) 08/18/2017   Lab Results  Component Value Date   CHOLHDL 4.2 08/18/2017   Lab Results  Component Value Date   HGBA1C 7.6 (A) 11/09/2020      Assessment & Plan:   Problem List Items Addressed This Visit   None   No orders of the defined types were placed in this encounter.   Follow-up: No follow-ups on file.    Ronnell Freshwater, NP

## 2021-01-02 NOTE — Progress Notes (Signed)
The patient was roomed but did not stay for visit. Patient requested pain medication and continuous fills of narcotic pain medications from primary care as contingency to establishing care. When he was told that this was not something that  could be done at this office, especially at the doses and frequency requested, he chose not to continue with the visit. Will continue care with current primary care provider. He has appointment already scheduled.

## 2021-01-05 NOTE — Progress Notes (Signed)
Cardiology Office Note   Date:  01/06/2021   ID:  [name], DOB Jun 06, 1955, MRN [id number]  PCP:  No primary care provider on file.  Cardiologist:   Minus Breeding, MD   Chief Complaint  Patient presents with   Shortness of Breath       History of Present Illness: [name] is a 65 y.o. male who presents for who presents for follow up of atrial fibrillation and atrial flutter. He is s/p flutter ablation in 2014 and has actually done well from this standpoint.   He unfortunately is quite miserable with back pain and knee pain.  He has some chronic dyspnea.  He called last week and he was describing some breathing difficulties and chest discomfort.  These are episodic.  They come and go sometimes with exertion but without.  Is really not different than symptoms he has had in the past.  We have done an extensive work-up on him to include a work-up for amyloid which was negative with MRI.  He has had coronary artery calcium noted on CTs but he had a catheterization in 2018 with no obstructive coronary disease.  In fact his coronaries were said to be normal.   He had an echo last in 2021 with normal left ventricular function but left ventricular hypertrophy that was moderate.  He does have elevated pulmonary pressures.  He does have longstanding COPD.  He does not use oxygen.  He has sleep apnea but does not use CPAP.  He has aortic valve sclerosis and a slight murmur.  He has had carotid bruits but no obstructive disease and only mild plaque last year.  He gets around very slowly with his back pain and he is frustrated because he cannot get into pain management and he cannot use pain medications.  He still tries to do things and do a little bit of work.  He is not describing PND or orthopnea.  He is not describing new palpitations, presyncope or syncope although he occasionally has some rapid heart rates that is not at all similar to what he had prior to his ablation.  He said it is  much better since then.   Past Medical History:  Diagnosis Date   Arthritis    lower back   Asthma    Atrial flutter [facility])    s/p CTI ablation by Dr Rayann Heman   Brain aneurysm 2009   questionable. A follow up CTA in 2009 showed no evidence of   Chronic back pain    DDD/stenosis   Colon polyps    9 polyps removed [id number]   Complication of anesthesia September 11, 2012   slow to awaken after ablation   COPD (chronic obstructive pulmonary disease) ([place])    Diabetes mellitus    takes Metformin and Glimepiride daily   Emphysema    GERD (gastroesophageal reflux disease)    takes Omeprazole bid   History of shingles    HLD (hyperlipidemia)    takes Pravastatin daily   HTN (hypertension)    takes Prinizide daily   Obesity    OSA (obstructive sleep apnea)    not always using cpap   Overdose 2009   unintentional Flecanide overdose   Peripheral neuropathy    Short-term memory loss    Tobacco abuse     Past Surgical History:  Procedure Laterality Date   ANTERIOR CERVICAL DECOMP/DISCECTOMY FUSION  01/04/2012   Procedure: ANTERIOR CERVICAL DECOMPRESSION/DISCECTOMY FUSION 1 LEVEL/HARDWARE REMOVAL;  Surgeon: Dellis Filbert  Flo Shanks, MD;  Location: [place] [facility]URO ORS;  Service: Neurosurgery;  Laterality: N/A;  explore cervical fusion Cervical six - seven  with removal of codman plate anterior cervical decompression with fusion interbody prothesis plating and bonegraft   APPENDECTOMY  2-56yr ago   AColeridgeN/A 09/11/2012   PT DID NOT HAVE AN ATRIAL FIBRILLATION ABLATION IN 2014!  ATRIAL FLUTTER ABLATION ONLY   ATRIAL FLUTTER ABLATION  09/11/2012   CTI ablation by Dr ARayann Heman  BIOPSY  11/07/2019   Procedure: BIOPSY;  Surgeon: JMilus Banister MD;  [facility]: [facility];  Service: Endoscopy;;   CARDIAC CATHETERIZATION  2008   no significant CAD   CARDIOVERSION  05/05/2011   Procedure: CARDIOVERSION;  Surgeon: BLelon Perla MD;  [place]  Service: Cardiovascular;   Laterality: N/A;   CARDIOVERSION Bilateral 07/26/2012   Procedure: CARDIOVERSION;  Surgeon: JMinus Breeding MD;  [facility];  Service: Cardiovascular;  Laterality: Bilateral;   CARPAL TUNNEL RELEASE  99/2000   bilateral   COLONOSCOPY WITH PROPOFOL N/A 12/13/2012   Procedure: COLONOSCOPY WITH PROPOFOL;  Surgeon: DMilus Banister MD;  [facility];  Service: Endoscopy;  Laterality: N/A;   ELECTROPHYSIOLOGIC STUDY N/A 04/21/2015   Procedure: Atrial Fibrillation Ablation;  Surgeon: JThompson Grayer MD;  [facility];  Service: Cardiovascular;  Laterality: N/A;   ESOPHAGOGASTRODUODENOSCOPY (EGD) WITH PROPOFOL N/A 11/07/2019   Procedure: ESOPHAGOGASTRODUODENOSCOPY (EGD) WITH PROPOFOL;  Surgeon: JMilus Banister MD;  [facility];  Service: Endoscopy;  Laterality: N/A;   HERNIA REPAIR     KNEE SURGERY  6-719yrago   left   LEFT HEART CATH AND CORONARY ANGIOGRAPHY N/A 09/19/2016   Procedure: Left Heart Cath and Coronary Angiography;  Surgeon: SmBelva CromeMD;  [facility];  Service: Cardiovascular;  Laterality: N/A;   Left inguinal hernia repair     as a child   NASAL SEPTOPLASTY W/ TURBINOPLASTY Bilateral 02/19/2014   Procedure: NASAL SEPTOPLASTY WITH BILATERAL TURBINATE REDUCTION;  Surgeon: KaJodi MarbleMD;  [facility]: [facility];  Laterality: Bilateral;   NECK SURGERY  1566yrgo   right shoulder surgery  [id number]   cyst removed   TEE WITHOUT CARDIOVERSION  05/05/2011   Procedure: TRANSESOPHAGEAL ECHOCARDIOGRAM (TEE);  Surgeon: BriaLelon Perla;  [facility];  Service: Cardiovascular;  Laterality: N/A;   TEE WITHOUT CARDIOVERSION N/A 04/21/2015   Procedure: TRANSESOPHAGEAL ECHOCARDIOGRAM (TEE);  Surgeon: TracSueanne Margarita;  [facility];  Service: Cardiovascular;  Laterality: N/A;   THROAT SURGERY  4-86yrs8yr   "thought " it was cancer but came back not   TONSILLECTOMY       Current Outpatient  Medications  Medication Sig Dispense Refill   albuterol (PROAIR HFA) 108 (90 Base) MCG/ACT inhaler Inhale 1-2 puffs into the lungs every 6 (six) hours as needed for wheezing or shortness of breath. 18 g 5   allopurinol (ZYLOPRIM) 300 MG tablet Take 1 tablet (300 mg total) by mouth daily. 90 tablet 3   Ascorbic Acid (VITAMIN C PO) Take 500 mg by mouth daily.      b complex vitamins tablet Take 1 tablet by mouth daily.      bisoprolol (ZEBETA) 10 MG tablet Take 10 mg by mouth daily.      colchicine 0.6 MG tablet Take 1 tablet (0.6 mg total) by mouth 2 (two) times daily. 10 tablet 1   Continuous Blood Gluc Receiver ([facility]  G6 RECEIVER) DEVI      Continuous Blood Gluc Sensor (DEXCOM G6 SENSOR) MISC 1 Device by Does not apply route See admin instructions. Change every 10 days 9 each 3   Continuous Blood Gluc Transmit (DEXCOM G6 TRANSMITTER) MISC      diltiazem (CARDIZEM CD) 120 MG 24 hr capsule Take 120 mg by mouth daily.     Dulaglutide (TRULICITY) 1.5 0000000 SOPN Inject 1.5 mg into the skin weekly 2 mL 1   gabapentin (NEURONTIN) 300 MG capsule Take 300 mg by mouth 2 (two) times daily. Patient takes 1 tablet in morning and 2 tablets in evening     insulin aspart (FIASP FLEXTOUCH) 100 UNIT/ML FlexTouch Pen 3 times a day (just before each meal) 90-60-90 units. 240 mL 3   Insulin Pen Needle (B-D UF III MINI PEN NEEDLES) 31G X 5 MM MISC 1 Device by Other route 3 (three) times daily with meals. Use three times daily to inject insulin. 300 each 3   losartan (COZAAR) 50 MG tablet Take 1 tablet (50 mg total) by mouth daily. 90 tablet 3   metFORMIN (GLUCOPHAGE-XR) 500 MG 24 hr tablet TAKE 1 TABLET BY MOUTH EACH NIGHT AT BEDTIME 90 tablet 3   metolazone (ZAROXOLYN) 2.5 MG tablet TAKE 1 TABLET BY MOUTH ON TUESDAY AND THURSDAY 30 MINUTES BEFORE TAKING TORSEMIDE OR AS DIRECTED 15 tablet 3   Multiple Vitamin (MULTIVITAMIN WITH MINERALS) TABS Take 1 tablet by mouth daily.      Multiple Vitamins-Minerals (ZINC PO)  Take 50 mg by mouth daily.      nitroGLYCERIN (NITROSTAT) 0.4 MG SL tablet Place 1 tablet (0.4 mg total) under the tongue every 5 (five) minutes x 3 doses as needed for chest pain. 25 tablet 3   omeprazole (PRILOSEC) 20 MG capsule Take 20 mg by mouth 2 (two) times daily.     oxyCODONE-acetaminophen (PERCOCET) 10-325 MG tablet Take 1 tablet by mouth 4 (four) times daily as needed.     potassium chloride SA (KLOR-CON) 20 MEQ tablet TAKE 1 TABLET BY MOUTH DAILY. TAKE AN EXTRA TABLET ON TUESDAY AND THURSDAY 40 tablet 11   pravastatin (PRAVACHOL) 80 MG tablet TAKE 1 TABLET BY MOUTH DAILY AT BEDTIME 30 tablet 10   pregabalin (LYRICA) 75 MG capsule Take 1 capsule (75 mg total) by mouth 2 (two) times daily. 60 capsule 0   torsemide (DEMADEX) 20 MG tablet TAKE 2 TABLETS BY MOUTH 2 TIMES DAILY 360 tablet 2   triamcinolone cream (KENALOG) 0.1 % Apply 1 application topically daily as needed. 453 g 2   umeclidinium-vilanterol (ANORO ELLIPTA) 62.5-25 MCG/INH AEPB Inhale 1 puff into the lungs daily. 60 each 5   VITAMIN A PO Take 2,400 mcg by mouth daily.      Vitamin D, Ergocalciferol, (DRISDOL) 1.25 MG (50000 UT) CAPS capsule Take 50,000 Units by mouth every 7 (seven) days.      warfarin (COUMADIN) 5 MG tablet TAKE 1 TO 1 AND 1/2 TABLETS BY MOUTH DAILY AS DIRECTED BY THE COUMADIN CLINIC 45 tablet 1   No current facility-administered medications for this visit.    Allergies:   Adhesive [tape] and Latex    ROS:  Please see the history of present illness.   Otherwise, review of systems are positive for none.   All other systems are reviewed and negative.    PHYSICAL EXAM: VS:  BP 132/72   Pulse 78   Ht '6\' 2"'$  (1.88 m)   Wt 262 lb (118.8 kg)  SpO2 97%   BMI 33.64 kg/m  , BMI Body mass index is 33.64 kg/m. GENERAL:  Well appearing NECK:  No jugular venous distention, waveform within normal limits, carotid upstroke brisk and symmetric, no bruits, no thyromegaly LUNGS: Mild diffuse expiratory  wheezes CHEST:  Unremarkable HEART:  PMI not displaced or sustained,S1 and S2 within normal limits, no S3, no S4, no clicks, no rubs, no murmurs ABD:  Flat, positive bowel sounds normal in frequency in pitch, no bruits, no rebound, no guarding, no midline pulsatile mass, no hepatomegaly, no splenomegaly EXT:  2 plus pulses throughout, no edema, no cyanosis no clubbing  EKG:  EKG is   ordered today. Sinus rhythm, rate 78, axis within normal limits, intervals within normal limits, no acute ST-T wave changes, lateral T wave inversions unchanged from previous.  Recent Labs: 04/07/2020: ALT 25 09/16/2020: BUN 21; Creatinine, Ser 1.17; Potassium 3.5; Sodium 142 10/22/2020: Hemoglobin 10.4; Platelets 129    Lipid Panel    Component Value Date/Time   CHOL 122 08/18/2017 0910   TRIG 400 (H) 08/18/2017 0910   HDL 29 (L) 08/18/2017 0910   CHOLHDL 4.2 08/18/2017 0910   CHOLHDL 5.3 08/27/2016 0235   VLDL 66 (H) 08/27/2016 0235   LDLCALC 13 08/18/2017 0910   LDLDIRECT 99.9 09/08/2011 0843      Wt Readings from Last 3 Encounters:  01/06/21 262 lb (118.8 kg)  12/28/20 257 lb 1.6 oz (116.6 kg)  11/09/20 261 lb 9.6 oz (118.7 kg)      Other studies Reviewed: Additional studies/ records that were reviewed today include:  Labs Review of the above records demonstrates:  Please see elsewhere in the note.     ASSESSMENT AND PLAN:   Edema/SOB This is baseline.  He takes a as needed Zaroxolyn.  I will check a BNP level and a basic metabolic profile today because of the shortness of breath and his use of diuretics.   COPD (chronic obstructive pulmonary disease) (HCC) No change in therapy.  He was supposed to have pulmonary follow-up in the past but has canceled these appointments.   Essential hypertension [facility].  No change in therapy.   Sleep apnea He does not sleep with his CPAP.      Non-insulin dependent type 2 diabetes mellitus (HCC) A1c was 7.6 which is down from 8.6.  He is having  this actively followed by Dr. Loanne Drilling Follow-up  PAF (paroxysmal atrial fibrillation) [facility]) He tolerates anticoagulation.  No change in therapy.    LVH:  He has moderate LVH with biatrial enlargement.  PYP scan was indeterminate.  However, MRI was negative for evidence of amyloid.  I will follow up with an echo in six months.   LEG PAIN:   He requests a pain management referral and I will try to facilitate this.   CHEST PAIN: He has had coronary calcium but no plaque.  He has a stable infrequent atypical pain syndrome.  No further work-up is suggested.  He needs continued risk reduction.   Current medicines are reviewed at length with the patient today.  The patient does not have concerns regarding medicines.  The following changes have been made:   None  Labs/ tests ordered today include:   Orders Placed This Encounter  Procedures   Basic metabolic panel   Brain natriuretic peptide   EKG 12-Lead   ECHOCARDIOGRAM COMPLETE      Disposition:   FU with APP in six months.   Signed, Minus Breeding, MD  01/06/2021 10:09 AM    [facility]

## 2021-01-06 ENCOUNTER — Other Ambulatory Visit: Payer: Self-pay

## 2021-01-06 ENCOUNTER — Ambulatory Visit (INDEPENDENT_AMBULATORY_CARE_PROVIDER_SITE_OTHER): Payer: 59 | Admitting: Pharmacist

## 2021-01-06 ENCOUNTER — Ambulatory Visit (INDEPENDENT_AMBULATORY_CARE_PROVIDER_SITE_OTHER): Payer: 59 | Admitting: Cardiology

## 2021-01-06 ENCOUNTER — Ambulatory Visit: Payer: 59 | Admitting: Podiatry

## 2021-01-06 ENCOUNTER — Encounter: Payer: Self-pay | Admitting: Cardiology

## 2021-01-06 VITALS — BP 132/72 | HR 78 | Ht 74.0 in | Wt 262.0 lb

## 2021-01-06 DIAGNOSIS — Z7901 Long term (current) use of anticoagulants: Secondary | ICD-10-CM | POA: Diagnosis not present

## 2021-01-06 DIAGNOSIS — R0602 Shortness of breath: Secondary | ICD-10-CM

## 2021-01-06 DIAGNOSIS — I50812 Chronic right heart failure: Secondary | ICD-10-CM

## 2021-01-06 DIAGNOSIS — I5033 Acute on chronic diastolic (congestive) heart failure: Secondary | ICD-10-CM

## 2021-01-06 DIAGNOSIS — I4892 Unspecified atrial flutter: Secondary | ICD-10-CM

## 2021-01-06 DIAGNOSIS — I480 Paroxysmal atrial fibrillation: Secondary | ICD-10-CM | POA: Diagnosis not present

## 2021-01-06 LAB — POCT INR: INR: 2.7 (ref 2.0–3.0)

## 2021-01-06 NOTE — Patient Instructions (Signed)
Description   continue taking 1 tablet daily except for 1/2 tablet every Monday, Wednesday and Friday. Repeat INR  in 6 weeks.

## 2021-01-06 NOTE — Patient Instructions (Signed)
Medication Instructions:  Your Physician recommend you continue on your current medication as directed.    *If you need a refill on your cardiac medications before your next appointment, please call your pharmacy*   Lab Work: Your physician recommends lab work today (BNP, BMP).  If you have labs (blood work) drawn today and your tests are completely normal, you will receive your results only by: [place] (if you have MyChart) OR A paper copy in the mail If you have any lab test that is abnormal or we need to change your treatment, we will call you to review the results.   Testing/Procedures: Your physician has requested that you have an echocardiogram in 6 months. Echocardiography is a painless test that uses sound waves to create images of your heart. It provides your doctor with information about the size and shape of your heart and how well your heart's chambers and valves are working. This procedure takes approximately one hour. There are no restrictions for this procedure. [address] [address]    Follow-Up: At [facility], you and your health needs are our priority.  As part of our continuing mission to provide you with exceptional heart care, we have created designated Provider Care Teams.  These Care Teams include your primary Cardiologist (physician) and Advanced Practice Providers (APPs -  Physician Assistants and Nurse Practitioners) who all work together to provide you with the care you need, when you need it.  We recommend signing up for the patient portal called "MyChart".  Sign up information is provided on this After Visit Summary.  MyChart is used to connect with patients for Virtual Visits (Telemedicine).  Patients are able to view lab/test results, encounter notes, upcoming appointments, etc.  Non-urgent messages can be sent to your provider as well.   To learn more about what you can do with MyChart, go to [email or web address].    Your next  appointment:   6 month(s)  The format for your next appointment:   In Person  Provider:   [name], PA

## 2021-01-07 LAB — BASIC METABOLIC PANEL
BUN/Creatinine Ratio: 17 (ref 10–24)
BUN: 21 mg/dL (ref 8–27)
CO2: 27 mmol/L (ref 20–29)
Calcium: 9.4 mg/dL (ref 8.6–10.2)
Chloride: 100 mmol/L (ref 96–106)
Creatinine, Ser: 1.26 mg/dL (ref 0.76–1.27)
Glucose: 158 mg/dL — ABNORMAL HIGH (ref 65–99)
Potassium: 3.5 mmol/L (ref 3.5–5.2)
Sodium: 144 mmol/L (ref 134–144)
eGFR: 64 mL/min/{1.73_m2} (ref >59)

## 2021-01-07 LAB — BRAIN NATRIURETIC PEPTIDE: BNP: 90.8 pg/mL (ref 0.0–100.0)

## 2021-01-09 ENCOUNTER — Other Ambulatory Visit: Payer: Self-pay | Admitting: Cardiology

## 2021-01-11 ENCOUNTER — Encounter: Payer: Self-pay | Admitting: *Deleted

## 2021-01-23 ENCOUNTER — Other Ambulatory Visit: Payer: Self-pay | Admitting: Cardiology

## 2021-01-28 ENCOUNTER — Other Ambulatory Visit: Payer: Self-pay

## 2021-01-28 ENCOUNTER — Encounter: Payer: Self-pay | Admitting: Surgery

## 2021-01-28 ENCOUNTER — Ambulatory Visit (INDEPENDENT_AMBULATORY_CARE_PROVIDER_SITE_OTHER): Payer: 59 | Admitting: Surgery

## 2021-01-28 VITALS — BP 138/68 | HR 71

## 2021-01-28 DIAGNOSIS — M25562 Pain in left knee: Secondary | ICD-10-CM | POA: Diagnosis not present

## 2021-01-28 DIAGNOSIS — M1712 Unilateral primary osteoarthritis, left knee: Secondary | ICD-10-CM | POA: Diagnosis not present

## 2021-01-28 NOTE — Progress Notes (Signed)
Office Visit Note   Patient: [name]           Date of Birth: 20-Feb-1956           MRN: [id number] Visit Date: 01/28/2021              Requested by: No referring provider defined for this encounter. PCP: Pcp, No   Assessment & Plan: Visit Diagnoses:  1. Primary osteoarthritis of left knee   2. Mechanical knee pain, left     Plan: With patient's ongoing chronic left knee pain mechanical symptoms I will order MRI to evaluate DJD and rule out meniscal tear.  Follow-up Dr. Lorin Mercy after completion to discuss results and further treatment options.  In regards to the bilateral lower extremity edema that he has had advised him to speak with his primary care provider and cardiologist.  Follow-Up Instructions: Return in about 3 weeks (around 02/18/2021) for with dr yates for mri review.   Orders:  Orders Placed This Encounter  Procedures   MR Knee Left w/o contrast   No orders of the defined types were placed in this encounter.     Procedures: No procedures performed   Clinical Data: No additional findings.   Subjective: Chief Complaint  Patient presents with   Left Knee - Follow-up    HPI 65 year old white male returns with complaints of left knee pain.  Left knee pain a chronic issue.  He was last seen by me June 2022 and I performed left knee aspiration and intra-articular Marcaine/Depo-Medrol injection.  That fluid was sent to the lab for analysis and was positive for monosodium urate's.  He also had blood work drawn and uric acid was elevated at 11.3.  Patient did follow with Dr. Louanne Skye to review those labs.  He continues have ongoing pain in his knee that is increased with activity.  Patient states that he is also been having ongoing issues with bilateral lower extremity edema.  He does have history of cardiac issues.  Followed by cardiology. Review of Systems No current cardiac pulmonary GI GU  Objective: Vital Signs: BP 138/68 (BP Location: Left Arm, Patient  Position: Sitting, Cuff Size: Normal)   Pulse 71   SpO2 95%   Physical Exam Patient is not in any acute distress.  He has 3+ bilateral pretibial edema.  Left knee good range of motion.  Some swelling with small effusion.  Joint nontender.  No signs of infection.  Bilateral calves nontender. Ortho Exam  Specialty Comments:  No specialty comments available.  Imaging: No results found.   PMFS History: Patient Active Problem List   Diagnosis Date Noted   Dysphagia 04/17/2020   LVH (left ventricular hypertrophy) 11/07/2019   Heme positive stool 10/03/2019   Anemia 10/03/2019   Insulin dependent type 2 diabetes mellitus ([place]) 08/26/2019   Acute on chronic diastolic heart failure ([place]) 08/26/2019   Rash and nonspecific skin eruption 08/26/2019   Foot pain 08/22/2019   Right heart failure ([place]) 08/07/2019   Leg swelling 07/28/2019   Educated about COVID-19 virus infection 07/28/2019   Diabetes ([place]) 06/12/2019   Urticaria 05/15/2019   Dermatitis due to drug 04/18/2019   Edema 03/29/2019   Erectile dysfunction 03/29/2019   High risk medication use 03/29/2019   History of iron deficiency 03/29/2019   Pulmonary hypertension ([place]) 03/29/2019   Venous stasis dermatitis of both lower extremities 03/29/2019   Mixed hyperlipidemia 02/14/2019   Onychomycosis due to dermatophyte 11/06/2018   Hammer toe of right  foot 07/17/2018   Tinea pedis of both feet 07/17/2018   History of radiofrequency ablation (RFA) procedure for cardiac arrhythmia 06/21/2018   Lumbar back pain with radiculopathy affecting lower extremity 06/21/2018   Hepatic cirrhosis ([place]) 12/26/2017   Left lower quadrant pain 12/05/2017   Change in bowel habits 12/05/2017   Palpitations 10/03/2017   SOB (shortness of breath) 04/12/2017   Chest pain with moderate risk of acute coronary syndrome 08/28/2016   Unstable angina (HCC) 08/26/2016   Longstanding persistent atrial fibrillation ([place]) 04/21/2015   Deviated nasal  septum 02/19/2014   History of colon polyps 11/22/2012   Syncope 10/17/2012   Acute asthmatic bronchitis 11/22/2011   Benign neoplasm of colon 10/13/2011   Unspecified gastritis and gastroduodenitis without mention of hemorrhage 10/13/2011   GERD (gastroesophageal reflux disease) 10/13/2011   Obesity (BMI 30-39.9) 09/30/2011   Dyslipidemia 06/23/2011   Neuropathy 06/23/2011   Fatigue 05/13/2011   Malaise and fatigue 05/13/2011   Rectal bleeding 04/18/2011   Atrial flutter ([place]) 04/18/2011   Chronic anticoagulation 11/10/2010   PAF (paroxysmal atrial fibrillation) (HCC)    Tobacco abuse    COPD (chronic obstructive pulmonary disease) ([place]) 02/11/2010   Essential hypertension 01/28/2010   OSA (obstructive sleep apnea) 01/28/2010   Past Medical History:  Diagnosis Date   Arthritis    lower back   Asthma    Atrial flutter ([place])    s/p CTI ablation by Dr Rayann Heman   Brain aneurysm 2009   questionable. A follow up CTA in 2009 showed no evidence of   Chronic back pain    DDD/stenosis   Colon polyps    9 polyps removed [id number]   Complication of anesthesia September 11, 2012   slow to awaken after ablation   COPD (chronic obstructive pulmonary disease) ([place])    Diabetes mellitus    takes Metformin and Glimepiride daily   Emphysema    GERD (gastroesophageal reflux disease)    takes Omeprazole bid   History of shingles    HLD (hyperlipidemia)    takes Pravastatin daily   HTN (hypertension)    takes Prinizide daily   Obesity    OSA (obstructive sleep apnea)    not always using cpap   Overdose 2009   unintentional Flecanide overdose   Peripheral neuropathy    Short-term memory loss    Tobacco abuse     Family History  Problem Relation Age of Onset   Diabetes Mother    Heart disease Father    Lung cancer Father    Diabetes Maternal Grandmother    Colon cancer Neg Hx    Anesthesia problems Neg Hx    Hypotension Neg Hx    Malignant hyperthermia Neg Hx    Pseudochol  deficiency Neg Hx    Esophageal cancer Neg Hx    Pancreatic cancer Neg Hx    Stomach cancer Neg Hx     Past Surgical History:  Procedure Laterality Date   ANTERIOR CERVICAL DECOMP/DISCECTOMY FUSION  01/04/2012   Procedure: ANTERIOR CERVICAL DECOMPRESSION/DISCECTOMY FUSION 1 LEVEL/HARDWARE REMOVAL;  Surgeon: Ophelia Charter, MD;  Location: [place] [facility];  Service: Neurosurgery;  Laterality: N/A;  explore cervical fusion Cervical six - seven  with removal of codman plate anterior cervical decompression with fusion interbody prothesis plating and bonegraft   APPENDECTOMY  2-33yr ago   [place]/A 09/11/2012   PT DID NOT HAVE AN ATRIAL FIBRILLATION ABLATION IN 2014!  ATRIAL FLUTTER ABLATION ONLY   ATRIAL FLUTTER ABLATION  09/11/2012   CTI ablation by Dr Rayann Heman   BIOPSY  11/07/2019   Procedure: BIOPSY;  Surgeon: Milus Banister, MD;  [facility];  Service: Endoscopy;;   CARDIAC CATHETERIZATION  2008   no significant CAD   CARDIOVERSION  05/05/2011   Procedure: CARDIOVERSION;  Surgeon: Lelon Perla, MD;  Location: [place];  Service: Cardiovascular;  Laterality: N/A;   CARDIOVERSION Bilateral 07/26/2012   Procedure: CARDIOVERSION;  Surgeon: Minus Breeding, MD;  [facility];  Service: Cardiovascular;  Laterality: Bilateral;   CARPAL TUNNEL RELEASE  99/2000   bilateral   COLONOSCOPY WITH PROPOFOL N/A 12/13/2012   Procedure: COLONOSCOPY WITH PROPOFOL;  Surgeon: Milus Banister, MD;  [facility];  Service: Endoscopy;  Laterality: N/A;   ELECTROPHYSIOLOGIC STUDY N/A 04/21/2015   Procedure: Atrial Fibrillation Ablation;  Surgeon: Thompson Grayer, MD;  Location: [place] CV LAB;  Service: Cardiovascular;  Laterality: N/A;   ESOPHAGOGASTRODUODENOSCOPY (EGD) WITH PROPOFOL N/A 11/07/2019   Procedure: ESOPHAGOGASTRODUODENOSCOPY (EGD) WITH PROPOFOL;  Surgeon: Milus Banister, MD;  [facility];  Service: Endoscopy;  Laterality: N/A;    HERNIA REPAIR     KNEE SURGERY  6-26yr ago   left   LEFT HEART CATH AND CORONARY ANGIOGRAPHY N/A 09/19/2016   Procedure: Left Heart Cath and Coronary Angiography;  Surgeon: SBelva Crome MD;  [place];  Service: Cardiovascular;  Laterality: N/A;   Left inguinal hernia repair     as a child   NASAL SEPTOPLASTY W/ TURBINOPLASTY Bilateral 02/19/2014   Procedure: NASAL SEPTOPLASTY WITH BILATERAL TURBINATE REDUCTION;  Surgeon: KJodi Marble MD;  [place]  Service: ENT;  Laterality: Bilateral;   NECK SURGERY  1954[place]ago   right shoulder surgery  [id number]   cyst removed   TEE WITHOUT CARDIOVERSION  05/05/2011   Procedure: TRANSESOPHAGEAL ECHOCARDIOGRAM (TEE);  Surgeon: BriLelon PerlaD;  [facility];  Service: Cardiovascular;  Laterality: N/A;   TEE WITHOUT CARDIOVERSION N/A 04/21/2015   Procedure: TRANSESOPHAGEAL ECHOCARDIOGRAM (TEE);  Surgeon: TraSueanne MargaritaD;  [facility];  Service: Cardiovascular;  Laterality: N/A;   THROAT SURGERY  [id number]   "thought " it was cancer but came back not   TONSILLECTOMY     Social History   Occupational History   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

## 2021-02-14 ENCOUNTER — Ambulatory Visit
Admission: RE | Admit: 2021-02-14 | Discharge: 2021-02-14 | Disposition: A | Discharge status: Home / Self Care | Payer: 59 | Source: Ambulatory Visit | Attending: Surgery | Admitting: Surgery

## 2021-02-14 ENCOUNTER — Other Ambulatory Visit: Payer: Self-pay

## 2021-02-14 DIAGNOSIS — M25562 Pain in left knee: Secondary | ICD-10-CM

## 2021-02-14 DIAGNOSIS — M1712 Unilateral primary osteoarthritis, left knee: Secondary | ICD-10-CM

## 2021-02-17 ENCOUNTER — Other Ambulatory Visit: Payer: Self-pay

## 2021-02-17 ENCOUNTER — Ambulatory Visit (INDEPENDENT_AMBULATORY_CARE_PROVIDER_SITE_OTHER): Payer: 59

## 2021-02-17 DIAGNOSIS — Z7901 Long term (current) use of anticoagulants: Secondary | ICD-10-CM | POA: Diagnosis not present

## 2021-02-17 DIAGNOSIS — I480 Paroxysmal atrial fibrillation: Secondary | ICD-10-CM

## 2021-02-17 DIAGNOSIS — Z5181 Encounter for therapeutic drug level monitoring: Secondary | ICD-10-CM

## 2021-02-17 LAB — POCT INR: INR: 2.1 (ref 2.0–3.0)

## 2021-02-17 NOTE — Patient Instructions (Signed)
continue taking 1 tablet daily except for 1/2 tablet every Monday, Wednesday and Friday. Repeat INR  in 6 weeks.

## 2021-02-19 ENCOUNTER — Other Ambulatory Visit: Payer: Self-pay

## 2021-02-19 ENCOUNTER — Ambulatory Visit (INDEPENDENT_AMBULATORY_CARE_PROVIDER_SITE_OTHER): Payer: 59 | Admitting: Endocrinology

## 2021-02-19 VITALS — BP 110/50 | HR 75 | Ht 74.0 in | Wt 265.6 lb

## 2021-02-19 DIAGNOSIS — Z794 Long term (current) use of insulin: Secondary | ICD-10-CM | POA: Diagnosis not present

## 2021-02-19 DIAGNOSIS — E1122 Type 2 diabetes mellitus with diabetic chronic kidney disease: Secondary | ICD-10-CM

## 2021-02-19 DIAGNOSIS — N1831 Chronic kidney disease, stage 3a: Secondary | ICD-10-CM

## 2021-02-19 MED ORDER — FIASP FLEXTOUCH 100 UNIT/ML ~~LOC~~ SOPN: PEN_INJECTOR | SUBCUTANEOUS | 3 refills | Status: DC

## 2021-02-19 NOTE — Progress Notes (Signed)
Subjective:    Patient ID: [name], male    DOB: Aug 20, 1955, 65 y.o.   MRN: [id number]  HPI Pt returns for f/u of diabetes mellitus:  DM type: Insulin-requiring type 2 Dx'ed: 2202 Complications: stage 3 CRI.   Therapy: insulin since 5427, Trulicity, and metformin DKA: never Severe hypoglycemia: once (2021, when a meal was delayed after taking Novolog) Pancreatitis: never Pancreatic imaging: normal on 2017 CT.  SDOH: he declines insulins other than Novolog/Fiasp.  Other: he takes multiple daily injections; he did not tolerate Jardiance (dehydration) or time-release insulins (rash); metformin dosage is limited by abd pain, and Trulicity by nausea.   Interval history: No recent steroids.  I reviewed continuous glucose monitor data.  Glucose varies from 110-210.  There is no trend throughout the day, except it is lower at 5-7PM. Past Medical History:  Diagnosis Date   Arthritis    lower back   Asthma    Atrial flutter [facility])    s/p CTI ablation by Dr Rayann Heman   Brain aneurysm 2009   questionable. A follow up CTA in 2009 showed no evidence of   Chronic back pain    DDD/stenosis   Colon polyps    9 polyps removed 0/62/37   Complication of anesthesia September 11, 2012   slow to awaken after ablation   COPD (chronic obstructive pulmonary disease) ([place])    Diabetes mellitus    takes Metformin and Glimepiride daily   Emphysema    GERD (gastroesophageal reflux disease)    takes Omeprazole bid   History of shingles    HLD (hyperlipidemia)    takes Pravastatin daily   HTN (hypertension)    takes Prinizide daily   Obesity    OSA (obstructive sleep apnea)    not always using cpap   Overdose 2009   unintentional Flecanide overdose   Peripheral neuropathy    Short-term memory loss    Tobacco abuse     Past Surgical History:  Procedure Laterality Date   ANTERIOR CERVICAL DECOMP/DISCECTOMY FUSION  01/04/2012   Procedure: ANTERIOR CERVICAL DECOMPRESSION/DISCECTOMY FUSION 1  LEVEL/HARDWARE REMOVAL;  Surgeon: Ophelia Charter, MD;  Location: [place] NEURO ORS;  Service: Neurosurgery;  Laterality: N/A;  explore cervical fusion Cervical six - seven  with removal of codman plate anterior cervical decompression with fusion interbody prothesis plating and bonegraft   APPENDECTOMY  2-27yrs ago   [place] N/A 09/11/2012   PT DID NOT HAVE AN ATRIAL FIBRILLATION ABLATION IN 2014!  ATRIAL FLUTTER ABLATION ONLY   ATRIAL FLUTTER ABLATION  09/11/2012   CTI ablation by Dr Rayann Heman   BIOPSY  11/07/2019   Procedure: BIOPSY;  Surgeon: Milus Banister, MD;  [facility];  Service: Endoscopy;;   CARDIAC CATHETERIZATION  2008   no significant CAD   CARDIOVERSION  05/05/2011   Procedure: CARDIOVERSION;  Surgeon: Lelon Perla, MD;  Location: [place];  Service: Cardiovascular;  Laterality: N/A;   CARDIOVERSION Bilateral 07/26/2012   Procedure: CARDIOVERSION;  Surgeon: Minus Breeding, MD;  Location: [facility] ENDOSCOPY;  Service: Cardiovascular;  Laterality: Bilateral;   CARPAL TUNNEL RELEASE  99/2000   bilateral   COLONOSCOPY WITH PROPOFOL N/A 12/13/2012   Procedure: COLONOSCOPY WITH PROPOFOL;  Surgeon: Milus Banister, MD;  [facility];  Service: Endoscopy;  Laterality: N/A;   ELECTROPHYSIOLOGIC STUDY N/A 04/21/2015   Procedure: Atrial Fibrillation Ablation;  Surgeon: Thompson Grayer, MD;  Location: [place] CV LAB;  Service: Cardiovascular;  Laterality: N/A;   ESOPHAGOGASTRODUODENOSCOPY (  EGD) WITH PROPOFOL N/A 11/07/2019   Procedure: ESOPHAGOGASTRODUODENOSCOPY (EGD) WITH PROPOFOL;  Surgeon: Milus Banister, MD;  [facility];  Service: Endoscopy;  Laterality: N/A;   HERNIA REPAIR     KNEE SURGERY  6-46yrs ago   left   LEFT HEART CATH AND CORONARY ANGIOGRAPHY N/A 09/19/2016   Procedure: Left Heart Cath and Coronary Angiography;  Surgeon: Belva Crome, MD;  Location: [place] CV LAB;  Service: Cardiovascular;  Laterality: N/A;   Left  inguinal hernia repair     as a child   NASAL SEPTOPLASTY W/ TURBINOPLASTY Bilateral 02/19/2014   Procedure: NASAL SEPTOPLASTY WITH BILATERAL TURBINATE REDUCTION;  Surgeon: Jodi Marble, MD;  Location: [place];  Service: ENT;  Laterality: Bilateral;   NECK SURGERY  81yrs ago   right shoulder surgery  4-50yrs ago   cyst removed   TEE WITHOUT CARDIOVERSION  05/05/2011   Procedure: TRANSESOPHAGEAL ECHOCARDIOGRAM (TEE);  Surgeon: Lelon Perla, MD;  Location: [facility] ENDOSCOPY;  Service: Cardiovascular;  Laterality: N/A;   TEE WITHOUT CARDIOVERSION N/A 04/21/2015   Procedure: TRANSESOPHAGEAL ECHOCARDIOGRAM (TEE);  Surgeon: Sueanne Margarita, MD;  Location: [facility] ENDOSCOPY;  Service: Cardiovascular;  Laterality: N/A;   THROAT SURGERY  4-63yrs ago   "thought " it was cancer but came back not   TONSILLECTOMY      Social History   Socioeconomic History   Marital status: Married    Spouse name: Not on file   Number of children: 2   Years of education: Not on file   Highest education level: Not on file  Occupational History   Occupation: Dealer   Tobacco Use   Smoking status: Every Day    Packs/day: 1.00    Years: 49.00    Pack years: 49.00    Types: Cigarettes   Smokeless tobacco: Never   Tobacco comments:    1/2 pack per day 10/15/20  Vaping Use   Vaping Use: Never used  Substance and Sexual Activity   Alcohol use: Not Currently    Alcohol/week: 0.0 standard drinks    Comment: 12/05/17 pt stated he has drinked 1/2 drink in 3 years   Drug use: No   Sexual activity: Yes  Other Topics Concern   Not on file  Social History Narrative   Daily caffeine(Mountain Dew)       Lives in [place] with spouse.   Unemployed due to chronic back/ leg pain         Social Determinants of Health   Financial Resource Strain: Not on file  Food Insecurity: Not on file  Transportation Needs: Not on file  Physical Activity: Not on file  Stress: Not on file  Social Connections: Not on file   Intimate Partner Violence: Not on file    Current Outpatient Medications on File Prior to Visit  Medication Sig Dispense Refill   albuterol (PROAIR HFA) 108 (90 Base) MCG/ACT inhaler Inhale 1-2 puffs into the lungs every 6 (six) hours as needed for wheezing or shortness of breath. 18 g 5   allopurinol (ZYLOPRIM) 300 MG tablet Take 1 tablet (300 mg total) by mouth daily. 90 tablet 3   Ascorbic Acid (VITAMIN C PO) Take 500 mg by mouth daily.      b complex vitamins tablet Take 1 tablet by mouth daily.      bisoprolol (ZEBETA) 10 MG tablet Take 10 mg by mouth daily.      colchicine 0.6 MG tablet Take 1 tablet (0.6 mg total) by  mouth 2 (two) times daily. 10 tablet 1   Continuous Blood Gluc Receiver (DEXCOM G6 RECEIVER) DEVI      Continuous Blood Gluc Sensor (DEXCOM G6 SENSOR) MISC 1 Device by Does not apply route See admin instructions. Change every 10 days 9 each 3   Continuous Blood Gluc Transmit (DEXCOM G6 TRANSMITTER) MISC      diltiazem (CARDIZEM CD) 120 MG 24 hr capsule Take 120 mg by mouth daily.     Dulaglutide (TRULICITY) 1.5 GG/2.6RS SOPN Inject 1.5 mg into the skin weekly 2 mL 1   gabapentin (NEURONTIN) 300 MG capsule Take 300 mg by mouth 2 (two) times daily. Patient takes 1 tablet in morning and 2 tablets in evening     Insulin Pen Needle (B-D UF III MINI PEN NEEDLES) 31G X 5 MM MISC 1 Device by Other route 3 (three) times daily with meals. Use three times daily to inject insulin. 300 each 3   losartan (COZAAR) 50 MG tablet Take 1 tablet (50 mg total) by mouth daily. 90 tablet 3   metolazone (ZAROXOLYN) 2.5 MG tablet TAKE 1 TABLET BY MOUTH ON TUESDAY AND THURSDAY 30 MINUTES BEFORE TAKING TORSEMIDE OR AS DIRECTED 15 tablet 3   Multiple Vitamin (MULTIVITAMIN WITH MINERALS) TABS Take 1 tablet by mouth daily.      Multiple Vitamins-Minerals (ZINC PO) Take 50 mg by mouth daily.      nitroGLYCERIN (NITROSTAT) 0.4 MG SL tablet Place 1 tablet (0.4 mg total) under the tongue every 5 (five)  minutes x 3 doses as needed for chest pain. 25 tablet 3   omeprazole (PRILOSEC) 20 MG capsule Take 20 mg by mouth 2 (two) times daily.     oxyCODONE-acetaminophen (PERCOCET) 10-325 MG tablet Take 1 tablet by mouth 4 (four) times daily as needed.     potassium chloride SA (KLOR-CON) 20 MEQ tablet TAKE 1 TABLET BY MOUTH DAILY. TAKE AN EXTRA TABLET ON TUESDAY AND THURSDAY 40 tablet 11   pravastatin (PRAVACHOL) 80 MG tablet TAKE 1 TABLET BY MOUTH DAILY AT BEDTIME 30 tablet 10   pregabalin (LYRICA) 75 MG capsule Take 1 capsule (75 mg total) by mouth 2 (two) times daily. 60 capsule 0   torsemide (DEMADEX) 20 MG tablet TAKE 2 TABLETS BY MOUTH 2 TIMES DAILY 360 tablet 2   triamcinolone cream (KENALOG) 0.1 % Apply 1 application topically daily as needed. 453 g 2   umeclidinium-vilanterol (ANORO ELLIPTA) 62.5-25 MCG/INH AEPB Inhale 1 puff into the lungs daily. 60 each 5   VITAMIN A PO Take 2,400 mcg by mouth daily.      Vitamin D, Ergocalciferol, (DRISDOL) 1.25 MG (50000 UT) CAPS capsule Take 50,000 Units by mouth every 7 (seven) days.      warfarin (COUMADIN) 5 MG tablet TAKE 1 TO 1 AND 1/2 TABLETS BY MOUTH DAILY AS DIRECTED BY THE COUMADIN CLINIC 45 tablet 1   No current facility-administered medications on file prior to visit.    Allergies  Allergen Reactions   Adhesive [Tape]     itching   Latex Itching    When tape is on the skin too long skin gets red & itching    Family History  Problem Relation Age of Onset   Diabetes Mother    Heart disease Father    Lung cancer Father    Diabetes Maternal Grandmother    Colon cancer Neg Hx    Anesthesia problems Neg Hx    Hypotension Neg Hx    Malignant hyperthermia Neg  Hx    Pseudochol deficiency Neg Hx    Esophageal cancer Neg Hx    Pancreatic cancer Neg Hx    Stomach cancer Neg Hx     BP (!) 110/50 (BP Location: Right Arm, Patient Position: Sitting, Cuff Size: Large)   Pulse 75   Ht 6\' 2"  (1.88 m)   Wt 265 lb 9.6 oz (120.5 kg)   SpO2  95%   BMI 34.10 kg/m    Review of Systems He denies hypoglycemia/N/V.  HB is mild    Objective:   Physical Exam GENERAL: no distress Pulses: dorsalis pedis intact bilat.   MSK: no deformity of the feet CV: 1+ bilat leg edema.   Skin:  no ulcer on the feet, but the skin is dry and scaly.  normal temp on the feet.   Neuro: sensation is intact to touch on the feet, but severely decreased from normal.   Ext: there is bilateral onychomycosis of the toenails.   Lab Results  Component Value Date   CREATININE 1.26 01/06/2021   BUN 21 01/06/2021   NA 144 01/06/2021   K 3.5 01/06/2021   CL 100 01/06/2021   CO2 27 01/06/2021    A1c=7.1%     Assessment & Plan:  Insulin-requiring type 2 DM: well-controlled HB, due to Trulicity.  We can't increase  Patient Instructions  Please stop taking the metformin, and continue the same other 2 diabetes medications  check your blood sugar twice a day.  vary the time of day when you check, between before the 3 meals, and at bedtime.  also check if you have symptoms of your blood sugar being too high or too low.  please keep a record of the readings and bring it to your next appointment here (or you can bring the meter itself).  You can write it on any piece of paper.  please call us sooner if your blood sugar goes below 70, or if you have a lot of readings over 200.   Please come back for a follow-up appointment in January.

## 2021-02-19 NOTE — Patient Instructions (Addendum)
Please stop taking the metformin, and continue the same other 2 diabetes medications  check your blood sugar twice a day.  vary the time of day when you check, between before the 3 meals, and at bedtime.  also check if you have symptoms of your blood sugar being too high or too low.  please keep a record of the readings and bring it to your next appointment here (or you can bring the meter itself).  You can write it on any piece of paper.  please call us sooner if your blood sugar goes below 70, or if you have a lot of readings over 200.   Please come back for a follow-up appointment in January.

## 2021-02-23 ENCOUNTER — Ambulatory Visit: Payer: 59 | Admitting: Orthopaedic Surgery

## 2021-02-28 ENCOUNTER — Ambulatory Visit
Admission: RE | Admit: 2021-02-28 | Discharge: 2021-02-28 | Disposition: A | Discharge status: Home / Self Care | Payer: 59 | Source: Ambulatory Visit | Attending: Surgery | Admitting: Surgery

## 2021-02-28 ENCOUNTER — Other Ambulatory Visit: Payer: Self-pay

## 2021-03-01 ENCOUNTER — Other Ambulatory Visit: Payer: Self-pay | Admitting: Student

## 2021-03-02 ENCOUNTER — Ambulatory Visit: Payer: 59 | Admitting: Orthopaedic Surgery

## 2021-03-03 ENCOUNTER — Encounter: Payer: Self-pay | Admitting: Endocrinology

## 2021-03-10 ENCOUNTER — Ambulatory Visit (INDEPENDENT_AMBULATORY_CARE_PROVIDER_SITE_OTHER): Payer: 59 | Admitting: Physician Assistant

## 2021-03-10 ENCOUNTER — Encounter: Payer: Self-pay | Admitting: Physician Assistant

## 2021-03-10 ENCOUNTER — Other Ambulatory Visit: Payer: Self-pay

## 2021-03-10 VITALS — BP 138/62 | HR 73 | Ht 74.0 in | Wt 260.0 lb

## 2021-03-10 DIAGNOSIS — I10 Essential (primary) hypertension: Secondary | ICD-10-CM

## 2021-03-10 DIAGNOSIS — E785 Hyperlipidemia, unspecified: Secondary | ICD-10-CM | POA: Diagnosis not present

## 2021-03-10 DIAGNOSIS — E119 Type 2 diabetes mellitus without complications: Secondary | ICD-10-CM

## 2021-03-10 DIAGNOSIS — M79605 Pain in left leg: Secondary | ICD-10-CM

## 2021-03-10 DIAGNOSIS — M79604 Pain in right leg: Secondary | ICD-10-CM

## 2021-03-10 DIAGNOSIS — I480 Paroxysmal atrial fibrillation: Secondary | ICD-10-CM

## 2021-03-10 NOTE — Patient Instructions (Signed)
Medication Instructions:  Continue current medications  *If you need a refill on your cardiac medications before your next appointment, please call your pharmacy*   Lab Work: None Ordered   Testing/Procedures: Your physician has requested that you have a lower extremity arterial duplex. This test is an ultrasound of the arteries in the legs or arms. It looks at arterial blood flow in the legs and arms. Allow one hour for Lower and Upper Arterial scans. There are no restrictions or special instructions   Follow-Up: At [facility], you and your health needs are our priority.  As part of our continuing mission to provide you with exceptional heart care, we have created designated Provider Care Teams.  These Care Teams include your primary Cardiologist (physician) and Advanced Practice Providers (APPs -  Physician Assistants and Nurse Practitioners) who all work together to provide you with the care you need, when you need it.  We recommend signing up for the patient portal called "MyChart".  Sign up information is provided on this After Visit Summary.  MyChart is used to connect with patients for Virtual Visits (Telemedicine).  Patients are able to view lab/test results, encounter notes, upcoming appointments, etc.  Non-urgent messages can be sent to your provider as well.   To learn more about what you can do with MyChart, go to [email or web address].    Your next appointment:   February 2023 with [name]

## 2021-03-10 NOTE — Progress Notes (Signed)
Cardiology Office Note:    Date:  03/12/2021   ID:  [name], DOB 1955/11/23, MRN [id number]  PCP:  Merryl Hacker, No   [facility] Providers Cardiologist:  Minus Breeding, MD     Referring MD: [name], MD   Chief Complaint  Patient presents with   Follow-up    Seen for Dr. Percival Spanish    History of Present Illness:    [name] is a 65 y.o. male with a hx of COPD/emphysema, HTN, HLD, DM II, obesity, OSA intolerant of CPAP, history of tobacco abuse and history of atrial fibrillation and atrial flutter. Patient had atrial flutter ablation in 2014. Since he has both paroxysmal atrial fibrillation and atrial flutter, he underwent EP study with a second ablation on 04/21/2015. Cardiac catheterization in May 2018 showed widely patent coronary arteries with normal EF, moderate LVH. Echocardiogram obtained on 08/14/2019 showed EF 60 to 65%, moderate LVH, grade 2 DD, moderately elevated pulmonary artery systolic pressure, RVSP 35.7 mmHg, severe left atrial enlargement, moderate right atrial enlargement, mild MR, mild to moderate TR. Carotid Doppler obtained on 09/30/2019 showed 1 to 39% disease in bilateral internal carotid artery. Cardiac MRI performed on 12/16/2019 showed EF 66%, moderate concentric LVH, no myocardial LGE to suggest prior MI, infiltrative cardiac disease or myocarditis. No evidence of cardiac amyloidosis.  He unfortunately had a quite significant leg pain after cardiac MRI.  I last saw the patient in September 2021, I felt his pain is most consistent with myositis versus compressed nerve in the lower back.  Total CK was negative which argues against myositis.  Lower extremity arterial Doppler obtained on 02/12/2020 showed normal ABI and a triphasic blood flow throughout both legs, no vascular blockage was seen to explain his leg discomfort.  I last saw the patient on 09/16/2020 for leg edema, I instructed the patient to take extra half a tablet of torsemide on a as needed basis based  on the degree of swelling.  He was last seen by Dr. Percival Spanish in August 2022 at which time he continue to have back pain and that there is occasional palpitation.  Patient presents today with complaint of worsening leg discomfort.  This is not a new issue for him.  I previously ordered lower extremity ABI and LEA  in September of last year which came back showing triphasic blood flows throughout lower extremity.  He says that recently he has been noticing the pain worsens with ambulation and better with rest.  On exam, his lower extremity is tender.  Total CK obtained last year was not elevated to suggest myositis.  He has severe back issue which can potentially contribute to nerve pain in the lower extremity.  He clearly has neuropathy and is beginning to lose sensation in bilateral lower extremity.  I will repeat lower extremity ABI, however if negative, I would not recommend any further work-up from a cardiac perspective.  She may need to see pain management and neurosurgery at a later time to see if there is anything they can do from their perspective.  Past Medical History:  Diagnosis Date   Arthritis    lower back   Asthma    Atrial flutter [facility])    s/p CTI ablation by Dr Rayann Heman   Brain aneurysm 2009   questionable. A follow up CTA in 2009 showed no evidence of   Chronic back pain    DDD/stenosis   Colon polyps    9 polyps removed 0/17/79   Complication of  anesthesia September 11, 2012   slow to awaken after ablation   COPD (chronic obstructive pulmonary disease) ([place])    Diabetes mellitus    takes Metformin and Glimepiride daily   Emphysema    GERD (gastroesophageal reflux disease)    takes Omeprazole bid   History of shingles    HLD (hyperlipidemia)    takes Pravastatin daily   HTN (hypertension)    takes Prinizide daily   Obesity    OSA (obstructive sleep apnea)    not always using cpap   Overdose 2009   unintentional Flecanide overdose   Peripheral neuropathy    Short-term  memory loss    Tobacco abuse     Past Surgical History:  Procedure Laterality Date   ANTERIOR CERVICAL DECOMP/DISCECTOMY FUSION  01/04/2012   Procedure: ANTERIOR CERVICAL DECOMPRESSION/DISCECTOMY FUSION 1 LEVEL/HARDWARE REMOVAL;  Surgeon: Ophelia Charter, MD;  Location: [place] [facility];  Service: Neurosurgery;  Laterality: N/A;  explore cervical fusion Cervical six - seven  with removal of codman plate anterior cervical decompression with fusion interbody prothesis plating and bonegraft   APPENDECTOMY  2-28yrs ago   [place] N/A 09/11/2012   PT DID NOT HAVE AN ATRIAL FIBRILLATION ABLATION IN 2014!  ATRIAL FLUTTER ABLATION ONLY   ATRIAL FLUTTER ABLATION  09/11/2012   CTI ablation by Dr Rayann Heman   BIOPSY  11/07/2019   Procedure: BIOPSY;  Surgeon: Milus Banister, MD;  Location: [facility];  Service: Endoscopy;;   CARDIAC CATHETERIZATION  2008   no significant CAD   CARDIOVERSION  05/05/2011   Procedure: CARDIOVERSION;  Surgeon: Lelon Perla, MD;  Location: [place];  Service: Cardiovascular;  Laterality: N/A;   CARDIOVERSION Bilateral 07/26/2012   Procedure: CARDIOVERSION;  Surgeon: Minus Breeding, MD;  Location: [facility]COPY;  Service: Cardiovascular;  Laterality: Bilateral;   CARPAL TUNNEL RELEASE  99/2000   bilateral   COLONOSCOPY WITH PROPOFOL N/A 12/13/2012   Procedure: COLONOSCOPY WITH PROPOFOL;  Surgeon: Milus Banister, MD;  Location: [facility];  Service: Endoscopy;  Laterality: N/A;   ELECTROPHYSIOLOGIC STUDY N/A 04/21/2015   Procedure: Atrial Fibrillation Ablation;  Surgeon: Thompson Grayer, MD;  Location: [place] CV [facility];  Service: Cardiovascular;  Laterality: N/A;   ESOPHAGOGASTRODUODENOSCOPY (EGD) WITH PROPOFOL N/A 11/07/2019   Procedure: ESOPHAGOGASTRODUODENOSCOPY (EGD) WITH PROPOFOL;  Surgeon: Milus Banister, MD;  Location: [facility];  Service: Endoscopy;  Laterality: N/A;   HERNIA REPAIR     KNEE SURGERY  6-54yrs ago   left   LEFT HEART CATH AND  CORONARY ANGIOGRAPHY N/A 09/19/2016   Procedure: Left Heart Cath and Coronary Angiography;  Surgeon: Belva Crome, MD;  Location: [place] [facility];  Service: Cardiovascular;  Laterality: N/A;   Left inguinal hernia repair     as a child   NASAL SEPTOPLASTY W/ TURBINOPLASTY Bilateral 02/19/2014   Procedure: NASAL SEPTOPLASTY WITH BILATERAL TURBINATE REDUCTION;  Surgeon: Jodi Marble, MD;  Location: [place];  Service: ENT;  Laterality: Bilateral;   NECK SURGERY  12yrs ago   right shoulder surgery  4-63yrs ago   cyst removed   TEE WITHOUT CARDIOVERSION  05/05/2011   Procedure: TRANSESOPHAGEAL ECHOCARDIOGRAM (TEE);  Surgeon: Lelon Perla, MD;  Location[facility];  Service: Cardiovascular;  Laterality: N/A;   TEE WITHOUT CARDIOVERSION N/A 04/21/2015   Procedure: TRANSESOPHAGEAL ECHOCARDIOGRAM (TEE);  Surgeon: Sueanne Margarita, MD;  Location: [facility] END[facility]COPY;  Service: Cardiovascular;  Laterality: N/A;   THROAT SURGERY  4-20yrs ago   "thought " it was  cancer but came back not   TONSILLECTOMY      Current Medications: No outpatient medications have been marked as taking for the 03/10/21 encounter (Office Visit) with [name], PA.     Allergies:   Adhesive [tape] and Latex   Social History   Socioeconomic History   Marital status: Married    Spouse name: Not on file   Number of children: 2   Years of education: Not on file   Highest education level: Not on file  Occupational History   Occupation: Dealer   Tobacco Use   Smoking status: Every Day    Packs/day: 1.00    Years: 49.00    Pack years: 49.00    Types: Cigarettes   Smokeless tobacco: Never   Tobacco comments:    1/2 pack per day 10/15/20  Vaping Use   Vaping Use: Never used  Substance and Sexual Activity   Alcohol use: Not Currently    Alcohol/week: 0.0 standard drinks    Comment: 12/05/17 pt stated he has drinked 1/2 drink in 3 years   Drug use: No   Sexual activity: Yes  Other Topics Concern   Not on file  Social  History Narrative   Daily caffeine(Mountain Dew)       Lives in [place] with spouse.   Unemployed due to chronic back/ leg pain         Social Determinants of Health   Financial Resource Strain: Not on file  Food Insecurity: Not on file  Transportation Needs: Not on file  Physical Activity: Not on file  Stress: Not on file  Social Connections: Not on file     Family History: The patient's family history includes Diabetes in his maternal grandmother and mother; Heart disease in his father; Lung cancer in his father. There is no history of Colon cancer, Anesthesia problems, Hypotension, Malignant hyperthermia, Pseudochol deficiency, Esophageal cancer, Pancreatic cancer, or Stomach cancer.  ROS:   Please see the history of present illness.     All other systems reviewed and are negative.  EKGs/Labs/Other Studies Reviewed:    The following studies were reviewed today:  LE arterial doppler 02/12/2020 Summary:  Right: Resting right ankle-brachial index indicates noncompressible right  lower extremity arteries. The right toe-brachial index is normal.   Left: Resting left ankle-brachial index is within normal range. No  evidence of significant left lower extremity arterial disease. The left  toe-brachial index is normal.   EKG:  EKG is not ordered today.    Recent Labs: 04/07/2020: ALT 25 10/22/2020: Hemoglobin 10.4; Platelets 129 01/06/2021: BNP 90.8; BUN 21; Creatinine, Ser 1.26; Potassium 3.5; Sodium 144  Recent Lipid Panel    Component Value Date/Time   CHOL 122 08/18/2017 0910   TRIG 400 (H) 08/18/2017 0910   HDL 29 (L) 08/18/2017 0910   CHOLHDL 4.2 08/18/2017 0910   CHOLHDL 5.3 08/27/2016 0235   VLDL 66 (H) 08/27/2016 0235   LDLCALC 13 08/18/2017 0910   LDLDIRECT 99.9 09/08/2011 0843     Risk Assessment/Calculations:    CHA2DS2-VASc Score = 2   This indicates a 2.2% annual risk of stroke. The patient's score is based upon: CHF History: 0 HTN History:  1 Diabetes History: 1 Stroke History: 0 Vascular Disease History: 0 Age Score: 0 Gender Score: 0          Physical Exam:    VS:  BP 138/62   Pulse 73   Ht 6\' 2"  (1.88 m)   Wt 260  lb (117.9 kg)   SpO2 96%   BMI 33.38 kg/m     Wt Readings from Last 3 Encounters:  03/10/21 260 lb (117.9 kg)  02/19/21 265 lb 9.6 oz (120.5 kg)  01/06/21 262 lb (118.8 kg)     GEN:  Well nourished, well developed in no acute distress HEENT: Normal NECK: No JVD; No carotid bruits LYMPHATICS: No lymphadenopathy CARDIAC: RRR, no murmurs, rubs, gallops RESPIRATORY:  Clear to auscultation without rales, wheezing or rhonchi  ABDOMEN: Soft, non-tender, non-distended MUSCULOSKELETAL:  No edema; No deformity  SKIN: Warm and dry NEUROLOGIC:  Alert and oriented x 3 PSYCHIATRIC:  Normal affect   ASSESSMENT:    1. Pain in both lower extremities   2. Primary hypertension   3. Hyperlipidemia LDL goal <70   4. Controlled type 2 diabetes mellitus without complication, without long-term current use of insulin ([place])   5. PAF (paroxysmal atrial fibrillation) (HCC)    PLAN:    In order of problems listed above:  Bilateral lower extremity pain: the symptom is getting worse and occurs more so with ambulation.  However previous lower extremity arterial Doppler in September 2021 showed triphasic blood flow throughout both lower extremity.  On physical exam he also has good pulses otherwise well.  I will repeat a arterial Doppler and lower extremity ABI, if normal I would not recommend any further work-up.  I suspect some of this is related to his lower back issue and likely as result of spinal stenosis.  Hypertension: Blood pressure stable  Hyperlipidemia: On pravastatin  DM2: Managed by primary care provider  PAF: History of A. fib and a flutter ablation.  On chronic Coumadin therapy.        Medication Adjustments/Labs and Tests Ordered: Current medicines are reviewed at length with the patient  today.  Concerns regarding medicines are outlined above.  Orders Placed This Encounter  Procedures   VAS [place] ABI WITH/WO TBI   No orders of the defined types were placed in this encounter.   Patient Instructions  Medication Instructions:  Continue current medications  *If you need a refill on your cardiac medications before your next appointment, please call your pharmacy*   Lab Work: None Ordered   Testing/Procedures: Your physician has requested that you have a lower extremity arterial duplex. This test is an ultrasound of the arteries in the legs or arms. It looks at arterial blood flow in the legs and arms. Allow one hour for Lower and Upper Arterial scans. There are no restrictions or special instructions   Follow-Up: At [facility], you and your health needs are our priority.  As part of our continuing mission to provide you with exceptional heart care, we have created designated Provider Care Teams.  These Care Teams include your primary Cardiologist (physician) and Advanced Practice Providers (APPs -  Physician Assistants and Nurse Practitioners) who all work together to provide you with the care you need, when you need it.  We recommend signing up for the patient portal called "MyChart".  Sign up information is provided on this After Visit Summary.  MyChart is used to connect with patients for Virtual Visits (Telemedicine).  Patients are able to view lab/test results, encounter notes, upcoming appointments, etc.  Non-urgent messages can be sent to your provider as well.   To learn more about what you can do with MyChart, go to [email or web address].    Your next appointment:   February 2023 with [name]   Signed, [name], [place]  03/12/2021 11:21 PM    [place]

## 2021-03-12 ENCOUNTER — Encounter: Payer: Self-pay | Admitting: Physician Assistant

## 2021-03-12 ENCOUNTER — Other Ambulatory Visit: Payer: Self-pay | Admitting: Physician Assistant

## 2021-03-12 DIAGNOSIS — M79604 Pain in right leg: Secondary | ICD-10-CM

## 2021-03-15 ENCOUNTER — Encounter (HOSPITAL_COMMUNITY): Payer: Self-pay

## 2021-03-15 ENCOUNTER — Encounter: Payer: Self-pay | Admitting: Orthopaedic Surgery

## 2021-03-15 ENCOUNTER — Ambulatory Visit (INDEPENDENT_AMBULATORY_CARE_PROVIDER_SITE_OTHER): Payer: 59 | Admitting: Orthopaedic Surgery

## 2021-03-15 ENCOUNTER — Other Ambulatory Visit: Payer: Self-pay

## 2021-03-15 ENCOUNTER — Ambulatory Visit (HOSPITAL_COMMUNITY)
Admission: RE | Admit: 2021-03-15 | Discharge: 2021-03-15 | Disposition: A | Discharge status: Home / Self Care | Payer: 59 | Source: Ambulatory Visit | Attending: Physician Assistant | Admitting: Physician Assistant

## 2021-03-15 DIAGNOSIS — M79604 Pain in right leg: Secondary | ICD-10-CM

## 2021-03-15 DIAGNOSIS — M23204 Derangement of unspecified medial meniscus due to old tear or injury, left knee: Secondary | ICD-10-CM

## 2021-03-15 DIAGNOSIS — M23209 Derangement of unspecified meniscus due to old tear or injury, unspecified knee: Secondary | ICD-10-CM | POA: Insufficient documentation

## 2021-03-15 DIAGNOSIS — M79605 Pain in left leg: Secondary | ICD-10-CM

## 2021-03-15 NOTE — Progress Notes (Signed)
Office Visit Note   Patient: [name]           Date of Birth: Jan 15, 1956           MRN: [id number] Visit Date: 03/15/2021              Requested by: No referring provider defined for this encounter. PCP: Pcp, No   Assessment & Plan: Visit Diagnoses:  1. Old complex tear of medial meniscus of left knee     Plan: Patient's had previous injection in his knee without relief.  Had associated hyperglycemia with injections.  Similar problem when he had an epidural in the distant past.  Patient does have some arthritis in his knee but principal problem is complex meniscal tear with repetitive catching.  He could have arthroscopic meniscectomy and debridement as an outpatient if he could be cleared medically and by his cardiologist for outpatient knee scope procedure.  Risk surgery discussed.  Patient states he like to proceed.  Questions were elicited and answered.  Follow-Up Instructions: No follow-ups on file.   Orders:  No orders of the defined types were placed in this encounter.  No orders of the defined types were placed in this encounter.     Procedures: No procedures performed   Clinical Data: No additional findings.   Subjective: Chief Complaint  Patient presents with   Left Knee - Pain, Follow-up    MRI review    HPI 65 year old male seen for left knee pain and posterior knee posterior medial knee that radiates down to his ankle.  He states he has neuropathy but his pain weakness in his leg and difficulty walking has gotten significantly worse.  He is on Percocet 10/3 2550 tablets month or 5 tablets daily.  He has shoulder osteoarthritis, unstable angina, heart failure, chronic smoking, pulmonary hypertension, atrial fibs, gout on allopurinol and also diabetes on insulin.  He takes Neurontin.  Patient states his knee would not allow him to walk down steps he has to go 1 foot at a time up and down.  He has pain posterior medial popliteal region and MRI scan shows  large Baker's cyst with complex posterior medial meniscal tear as well as some tricompartmental arthritis.  Past history of patella fracture when he was age 63 of his left knee.  Patient does have some arthritis the opposite right knee which is not symptomatic by plain radiographs.  Patient does have some moderate lumbar stenosis at L3-4 and L1-3.  Some foraminal stenosis mild to moderate several levels.  Review of Systems no current dyspnea, angina.  Negative for CVA.  Positive for diabetes on insulin, all other systems noncontributory to HPI.   Objective: Vital Signs: BP 136/63   Pulse 73   Ht 6\' 2"  (1.88 m)   Wt 260 lb 12.8 oz (118.3 kg)   BMI 33.48 kg/m   Physical Exam Constitutional:      Appearance: He is well-developed.  HENT:     Head: Normocephalic and atraumatic.     Right Ear: External ear normal.     Left Ear: External ear normal.  Eyes:     Pupils: Pupils are equal, round, and reactive to light.  Neck:     Thyroid: No thyromegaly.     Trachea: No tracheal deviation.  Cardiovascular:     Rate and Rhythm: Normal rate.  Pulmonary:     Effort: Pulmonary effort is normal.     Breath sounds: No wheezing.  Abdominal:  General: Bowel sounds are normal.     Palpations: Abdomen is soft.  Musculoskeletal:     Cervical back: Neck supple.  Skin:    General: Skin is warm and dry.     Capillary Refill: Capillary refill takes less than 2 seconds.  Neurological:     Mental Status: He is alert and oriented to person, place, and time.  Psychiatric:        Behavior: Behavior normal.        Thought Content: Thought content normal.        Judgment: Judgment normal.    Ortho Exam patient has posterior medial pain with hyperextension left knee.  Negative logroll the hips.  Large palpable multilobulated cyst popliteal region.  Posterior medial joint line pain.  Collateral ligaments are stable ACL PCL exam is negative.  Specialty Comments:  No specialty comments  available.  Imaging: Narrative & Impression  CLINICAL DATA:  Patient complains of worsening left knee pain with mechanical symptoms. Patient reports anterior/posterior knee pain that radiates to his foot. Patient reports left leg weakness, numbness and swelling of his knee x 6-8 months. History of left knee arthroscopic surgery. Patient reports previous steroid injections 2+ months with 1 day relief. No known injury.   EXAM: MRI OF THE LEFT KNEE WITHOUT CONTRAST   TECHNIQUE: Multiplanar, multisequence MR imaging of the knee was performed. No intravenous contrast was administered.   COMPARISON:  X-ray knee 10/22/2020.   FINDINGS: MENISCI   Medial meniscus: Complex degenerative-type tearing of the medial meniscus predominantly involving the body and posterior horn segments with horizontal component (series 9, images 15-21).   Lateral meniscus: Intact.   LIGAMENTS   Cruciates: Intact ACL and PCL.   Collaterals: Medial collateral ligament is intact. Lateral collateral ligament complex is intact.   CARTILAGE   Patellofemoral: Full-thickness cartilage loss involving the mid to superior aspect of the lateral patellar facet. Partial-thickness cartilage loss within the lateral trochlea.   Medial: Mild diffuse cartilage thinning of the weight-bearing medial compartment.   Lateral: No chondral defect.   Joint: Small joint effusion. Loose body within the lateral aspect of the suprapatellar pouch measures approximately 2.4 x 1.7 cm. Fat pads within normal limits.   Popliteal Fossa: Large complex Baker's cyst measuring up to 9.5 cm in craniocaudal dimension. Intact popliteus tendon.   Extensor Mechanism: Distal quadriceps tendinosis. Intact patellar tendon.   Bones: Tricompartmental joint space narrowing with small marginal osteophyte formation. Degenerative subchondral marrow signal changes within the lateral patella. No fracture. No suspicious bone lesion.   Other:  Mild diffuse subcutaneous edema, nonspecific.   IMPRESSION: 1. Tricompartmental osteoarthritis, most severe within the patellofemoral compartment. 2. Complex degenerative tearing of the medial meniscus. 3. Small joint effusion with loose body. 4. Large complex Baker's cyst. 5. Distal quadriceps tendinosis.     Electronically Signed   By: Davina Poke D.O.   On: 03/02/2021 16:31       PMFS History: Patient Active Problem List   Diagnosis Date Noted   Chronic meniscal tear of knee 03/15/2021   Dysphagia 04/17/2020   LVH (left ventricular hypertrophy) 11/07/2019   Heme positive stool 10/03/2019   Anemia 10/03/2019   Insulin dependent type 2 diabetes mellitus ([place]) 08/26/2019   Acute on chronic diastolic heart failure ([place]) 08/26/2019   Rash and nonspecific skin eruption 08/26/2019   Foot pain 08/22/2019   Right heart failure ([place]) 08/07/2019   Leg swelling 07/28/2019   Educated about COVID-19 virus infection 07/28/2019   Diabetes ([place])  06/12/2019   Urticaria 05/15/2019   Dermatitis due to drug 04/18/2019   Edema 03/29/2019   Erectile dysfunction 03/29/2019   High risk medication use 03/29/2019   History of iron deficiency 03/29/2019   Pulmonary hypertension ([place]) 03/29/2019   Venous stasis dermatitis of both lower extremities 03/29/2019   Mixed hyperlipidemia 02/14/2019   Onychomycosis due to dermatophyte 11/06/2018   Hammer toe of right foot 07/17/2018   Tinea pedis of both feet 07/17/2018   History of radiofrequency ablation (RFA) procedure for cardiac arrhythmia 06/21/2018   Lumbar back pain with radiculopathy affecting lower extremity 06/21/2018   Hepatic cirrhosis ([place]) 12/26/2017   Left lower quadrant pain 12/05/2017   Change in bowel habits 12/05/2017   Palpitations 10/03/2017   SOB (shortness of breath) 04/12/2017   Chest pain with moderate risk of acute coronary syndrome 08/28/2016   Unstable angina (HCC) 08/26/2016   Longstanding persistent atrial  fibrillation ([place]) 04/21/2015   Deviated nasal septum 02/19/2014   History of colon polyps 11/22/2012   Syncope 10/17/2012   Acute asthmatic bronchitis 11/22/2011   Benign neoplasm of colon 10/13/2011   Unspecified gastritis and gastroduodenitis without mention of hemorrhage 10/13/2011   GERD (gastroesophageal reflux disease) 10/13/2011   Obesity (BMI 30-39.9) 09/30/2011   Dyslipidemia 06/23/2011   Neuropathy 06/23/2011   Fatigue 05/13/2011   Malaise and fatigue 05/13/2011   Rectal bleeding 04/18/2011   Atrial flutter ([place]) 04/18/2011   Chronic anticoagulation 11/10/2010   PAF (paroxysmal atrial fibrillation) (HCC)    Tobacco abuse    COPD (chronic obstructive pulmonary disease) ([place]) 02/11/2010   Essential hypertension 01/28/2010   OSA (obstructive sleep apnea) 01/28/2010   Past Medical History:  Diagnosis Date   Arthritis    lower back   Asthma    Atrial flutter ([place])    s/p CTI ablation by Dr Rayann Heman   Brain aneurysm 2009   questionable. A follow up CTA in 2009 showed no evidence of   Chronic back pain    DDD/stenosis   Colon polyps    9 polyps removed 5/42/70   Complication of anesthesia September 11, 2012   slow to awaken after ablation   COPD (chronic obstructive pulmonary disease) ([place])    Diabetes mellitus    takes Metformin and Glimepiride daily   Emphysema    GERD (gastroesophageal reflux disease)    takes Omeprazole bid   History of shingles    HLD (hyperlipidemia)    takes Pravastatin daily   HTN (hypertension)    takes Prinizide daily   Obesity    OSA (obstructive sleep apnea)    not always using cpap   Overdose 2009   unintentional Flecanide overdose   Peripheral neuropathy    Short-term memory loss    Tobacco abuse     Family History  Problem Relation Age of Onset   Diabetes Mother    Heart disease Father    Lung cancer Father    Diabetes Maternal Grandmother    Colon cancer Neg Hx    Anesthesia problems Neg Hx    Hypotension Neg Hx     Malignant hyperthermia Neg Hx    Pseudochol deficiency Neg Hx    Esophageal cancer Neg Hx    Pancreatic cancer Neg Hx    Stomach cancer Neg Hx     Past Surgical History:  Procedure Laterality Date   ANTERIOR CERVICAL DECOMP/DISCECTOMY FUSION  01/04/2012   Procedure: ANTERIOR CERVICAL DECOMPRESSION/DISCECTOMY FUSION 1 LEVEL/HARDWARE REMOVAL;  Surgeon: Ophelia Charter, MD;  Location:  [facility];  Service: Neurosurgery;  Laterality: N/A;  explore cervical fusion Cervical six - seven  with removal of codman plate anterior cervical decompression with fusion interbody prothesis plating and bonegraft   APPENDECTOMY  2-26yrs ago   [place] N/A 09/11/2012   PT DID NOT HAVE AN ATRIAL FIBRILLATION ABLATION IN 2014!  ATRIAL FLUTTER ABLATION ONLY   ATRIAL FLUTTER ABLATION  09/11/2012   CTI ablation by Dr Rayann Heman   BIOPSY  11/07/2019   Procedure: BIOPSY;  Surgeon: Milus Banister, MD;  Location: [facility];  Service: Endoscopy;;   CARDIAC CATHETERIZATION  2008   no significant CAD   CARDIOVERSION  05/05/2011   Procedure: CARDIOVERSION;  Surgeon: Lelon Perla, MD;  Location: [facility];  Service: Cardiovascular;  Laterality: N/A;   CARDIOVERSION Bilateral 07/26/2012   Procedure: CARDIOVERSION;  Surgeon: Minus Breeding, MD;  Location: [facility] ENDOSCOPY;  Service: Cardiovascular;  Laterality: Bilateral;   CARPAL TUNNEL RELEASE  99/2000   bilateral   COLONOSCOPY WITH PROPOFOL N/A 12/13/2012   Procedure: COLONOSCOPY WITH PROPOFOL;  Surgeon: Milus Banister, MD;  Location: [facility];  Service: Endoscopy;  Laterality: N/A;   ELECTROPHYSIOLOGIC STUDY N/A 04/21/2015   Procedure: Atrial Fibrillation Ablation;  Surgeon: Thompson Grayer, MD;  Location: [facility];  Service: Cardiovascular;  Laterality: N/A;   ESOPHAGOGASTRODUODENOSCOPY (EGD) WITH PROPOFOL N/A 11/07/2019   Procedure: ESOPHAGOGASTRODUODENOSCOPY (EGD) WITH PROPOFOL;  Surgeon: Milus Banister, MD;  Location: [facility];   Service: Endoscopy;  Laterality: N/A;   HERNIA REPAIR     KNEE SURGERY  6-59yrs ago   left   LEFT HEART CATH AND CORONARY ANGIOGRAPHY N/A 09/19/2016   Procedure: Left Heart Cath and Coronary Angiography;  Surgeon: Belva Crome, MD;  Location: [place] [facility];  Service: Cardiovascular;  Laterality: N/A;   Left inguinal hernia repair     as a child   NASAL SEPTOPLASTY W/ TURBINOPLASTY Bilateral 02/19/2014   Procedure: NASAL SEPTOPLASTY WITH BILATERAL TURBINATE REDUCTION;  Surgeon: Jodi Marble, MD;  Location: [facility];  Service: ENT;  Laterality: Bilateral;   NECK SURGERY  35yrs ago   right shoulder surgery  4-36yrs ago   cyst removed   TEE WITHOUT CARDIOVERSION  05/05/2011   Procedure: TRANSESOPHAGEAL ECHOCARDIOGRAM (TEE);  Surgeon: Lelon Perla, MD;  Location[facility];  Service: Cardiovascular;  Laterality: N/A;   TEE WITHOUT CARDIOVERSION N/A 04/21/2015   Procedure: TRANSESOPHAGEAL ECHOCARDIOGRAM (TEE);  Surgeon: Sueanne Margarita, MD;  Location: [facility]COPY;  Service: Cardiovascular;  Laterality: N/A;   THROAT SURGERY  4-53yrs ago   "thought " it was cancer but came back not   TONSILLECTOMY     Social History   Occupational History   Occupation: Dealer   Tobacco Use   Smoking status: Every Day    Packs/day: 1.00    Years: 49.00    Pack years: 49.00    Types: Cigarettes   Smokeless tobacco: Never   Tobacco comments:    1/2 pack per day 10/15/20  Vaping Use   Vaping Use: Never used  Substance and Sexual Activity   Alcohol use: Not Currently    Alcohol/week: 0.0 standard drinks    Comment: 12/05/17 pt stated he has drinked 1/2 drink in 3 years   Drug use: No   Sexual activity: Yes

## 2021-03-18 ENCOUNTER — Ambulatory Visit (INDEPENDENT_AMBULATORY_CARE_PROVIDER_SITE_OTHER): Payer: 59 | Admitting: Physician Assistant

## 2021-03-18 ENCOUNTER — Encounter: Payer: Self-pay | Admitting: Physician Assistant

## 2021-03-18 VITALS — BP 112/56 | HR 84 | Ht 73.0 in | Wt 261.1 lb

## 2021-03-18 DIAGNOSIS — R1310 Dysphagia, unspecified: Secondary | ICD-10-CM

## 2021-03-18 DIAGNOSIS — K219 Gastro-esophageal reflux disease without esophagitis: Secondary | ICD-10-CM | POA: Diagnosis not present

## 2021-03-18 MED ORDER — OMEPRAZOLE 20 MG PO CPDR: 20.0000 mg | DELAYED_RELEASE_CAPSULE | Freq: Two times a day (BID) | ORAL | 11 refills | Status: DC

## 2021-03-18 NOTE — Patient Instructions (Signed)
If you are age 65 or younger, your body mass index should be between 19-25. Your Body mass index is 34.45 kg/m. If this is out of the aformentioned range listed, please consider follow up with your Primary Care Provider.  ________________________________________________________  The [facility] providers would like to encourage you to use [facility] to communicate with providers for non-urgent requests or questions.  Due to long hold times on the telephone, sending your provider a message by [facility] may be a faster and more efficient way to get a response.  Please allow 48 business hours for a response.  Please remember that this is for non-urgent requests.  _______________________________________________________  [name] have been scheduled for a Barium Esophogram at [facility] (1st floor of the hospital) on 04/05/2021 at 10:30 am. Please arrive 15 minutes prior to your appointment for registration. Make certain not to have anything to eat or drink 3 hours prior to your test. If you need to reschedule for any reason, please contact radiology at [phone] to do so. ____________________________________________________________ A barium swallow is an examination that concentrates on views of the esophagus. This tends to be a double contrast exam (barium and two liquids which, when combined, create a gas to distend the wall of the oesophagus) or single contrast (non-ionic iodine based). The study is usually tailored to your symptoms so a good history is essential. Attention is paid during the study to the form, structure and configuration of the esophagus, looking for functional disorders (such as aspiration, dysphagia, achalasia, motility and reflux) EXAMINATION You may be asked to change into a gown, depending on the type of swallow being performed. A radiologist and radiographer will perform the procedure. The radiologist will advise you of the type of contrast selected for your procedure and direct  you during the exam. You will be asked to stand, sit or lie in several different positions and to hold a small amount of fluid in your mouth before being asked to swallow while the imaging is performed .In some instances you may be asked to swallow barium coated marshmallows to assess the motility of a solid food bolus. The exam can be recorded as a digital or video fluoroscopy procedure. POST PROCEDURE It will take 1-2 days for the barium to pass through your system. To facilitate this, it is important, unless otherwise directed, to increase your fluids for the next 24-48hrs and to resume your normal diet.  This test typically takes about 30 minutes to perform. ____________________________________________________________  Continue Omeprazole 20 mg 1 capsule twice daily  We will be referring you to see [facility]: They are located across the from [place], at [facility]. Their address is: [address], [place], [place] [address] Phone number: [phone]  Follow up pending the results of your Barium Swallow  Thank you for entrusting me with your care and choosing [facility].  Amy Esterwood, PA-C

## 2021-03-18 NOTE — Progress Notes (Signed)
Subjective:    Patient ID: [name], male    DOB: 1955-10-18, 65 y.o.   MRN: [id number]  HPI [name] is a 65 year old white male, established with Dr. Ardis Hughs who comes in today with complaints of dysphagia.  He says that food and pills are getting lodged intermittently and he feels as if something is growing in his throat.  Patient was last seen in December 2021 at that time with Candida esophagitis which had occurred secondary to antibiotic therapy.  He had also undergone laryngoscopy per Dr. Lucia Gaskins in December 2021 because of concerns with dysphagia and uvular mass.  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  It was not recommended that this be removed unless it became symptomatic but he did not feel that this had anything to do with his complaints of dysphagia. He had undergone EGD per Dr. Ardis Hughs in June 2021, no varices, mild gastritis, no signs of portal hypertension. Last colonoscopy October 2019 with removal of 3 polyps which were 3 to 5 mm in size, one was a tubular adenoma and 2 were sessile serrated polyps.  Indicated for 5-year interval follow-up. Patient has multiple other medical problems including but not limited to compensated cirrhosis, atrial flutter, atrial fibrillation, right heart failure, pulmonary hypertension, COPD, sleep apnea and adult onset diabetes mellitus.  Patient feels that he has had some increase in dysphagia symptoms over the past couple of months he says he may go a day or 2 without any issues but frequently will have episodes feeling as if bread and pasta and sometimes meat will get lodged transiently in his esophagus.  He does not have to regurgitate but says he had will have to stop eating and then drink more fluids to get the food to go down.  He also mentions that he does not have any teeth and therefore chewing is more difficult.  He does not have any difficulty with liquids, but does have difficulty intermittently with pills  causing similar symptoms. He says he also can feel his uvula in the back of his throat and that he feels that phlegm gets caught in this area and makes it difficult for him to clear his throat.  He says he produces a lot of phlegm because of his COPD.  He is talking about the uvula when he mention something "growing in his throat". He had been back to see Dr. Lucia Gaskins in June 2022 the small papilloma on the distal end of the uvula did not appear to be obstructing and posterior oropharyngeal wall was normal.  He felt that the patient's dysphagia symptoms were more esophageal in origin.   Review of Systems. Pertinent positive and negative review of systems were noted in the above HPI section.  All other review of systems was otherwise negative.   Outpatient Encounter Medications as of 03/18/2021  Medication Sig   albuterol (PROAIR HFA) 108 (90 Base) MCG/ACT inhaler Inhale 1-2 puffs into the lungs every 6 (six) hours as needed for wheezing or shortness of breath.   Ascorbic Acid (VITAMIN C PO) Take 500 mg by mouth daily.    b complex vitamins tablet Take 1 tablet by mouth daily.    bisoprolol (ZEBETA) 10 MG tablet Take 10 mg by mouth daily.    Continuous Blood Gluc Receiver (DEXCOM G6 RECEIVER) DEVI    Continuous Blood Gluc Sensor (DEXCOM G6 SENSOR) MISC 1 Device by Does not apply route See admin instructions. Change every 10 days  Continuous Blood Gluc Transmit (DEXCOM G6 TRANSMITTER) MISC    diltiazem (CARDIZEM CD) 120 MG 24 hr capsule Take 120 mg by mouth daily.   Dulaglutide (TRULICITY) 1.5 YF/7.4BS SOPN Inject 1.5 mg into the skin weekly   gabapentin (NEURONTIN) 300 MG capsule Take 300 mg by mouth 2 (two) times daily. Patient takes 1 tablet in morning and 2 tablets in evening   insulin aspart (FIASP FLEXTOUCH) 100 UNIT/ML FlexTouch Pen 3 times a day (just before each meal) 90-60-90 units.   Insulin Pen Needle (B-D UF III MINI PEN NEEDLES) 31G X 5 MM MISC 1 Device by Other route 3 (three) times  daily with meals. Use three times daily to inject insulin.   losartan (COZAAR) 50 MG tablet Take 1 tablet (50 mg total) by mouth daily.   metolazone (ZAROXOLYN) 2.5 MG tablet TAKE 1 TABLET BY MOUTH ON TUESDAY AND THURSDAY 30 MINUTES BEFORE TAKING TORSEMIDE OR AS DIRECTED   Multiple Vitamin (MULTIVITAMIN WITH MINERALS) TABS Take 1 tablet by mouth daily.    Multiple Vitamins-Minerals (ZINC PO) Take 50 mg by mouth daily.    nitroGLYCERIN (NITROSTAT) 0.4 MG SL tablet Place 1 tablet (0.4 mg total) under the tongue every 5 (five) minutes x 3 doses as needed for chest pain.   oxyCODONE-acetaminophen (PERCOCET) 10-325 MG tablet Take 1 tablet by mouth 4 (four) times daily as needed.   potassium chloride SA (KLOR-CON) 20 MEQ tablet TAKE 1 TABLET BY MOUTH DAILY. TAKE AN EXTRA TABLET ON TUESDAY AND THURSDAY   pravastatin (PRAVACHOL) 80 MG tablet TAKE 1 TABLET BY MOUTH DAILY AT BEDTIME   torsemide (DEMADEX) 20 MG tablet TAKE 2 TABLETS BY MOUTH 2 TIMES DAILY   triamcinolone cream (KENALOG) 0.1 % Apply 1 application topically daily as needed.   umeclidinium-vilanterol (ANORO ELLIPTA) 62.5-25 MCG/INH AEPB Inhale 1 puff into the lungs daily.   VITAMIN A PO Take 2,400 mcg by mouth daily.    Vitamin D, Ergocalciferol, (DRISDOL) 1.25 MG (50000 UT) CAPS capsule Take 50,000 Units by mouth every 7 (seven) days.    warfarin (COUMADIN) 5 MG tablet TAKE 1 TO 1 AND 1/2 TABLETS BY MOUTH DAILY AS DIRECTED BY THE COUMADIN CLINIC   [DISCONTINUED] omeprazole (PRILOSEC) 20 MG capsule Take 20 mg by mouth 2 (two) times daily.   omeprazole (PRILOSEC) 20 MG capsule Take 1 capsule (20 mg total) by mouth 2 (two) times daily.   pregabalin (LYRICA) 75 MG capsule Take 1 capsule (75 mg total) by mouth 2 (two) times daily. (Patient not taking: Reported on 03/18/2021)   [DISCONTINUED] allopurinol (ZYLOPRIM) 300 MG tablet Take 1 tablet (300 mg total) by mouth daily.   [DISCONTINUED] colchicine 0.6 MG tablet Take 1 tablet (0.6 mg total) by  mouth 2 (two) times daily.   [DISCONTINUED] cyclobenzaprine (FLEXERIL) 10 MG tablet Take 10 mg by mouth 2 (two) times daily as needed.   No facility-administered encounter medications on file as of 03/18/2021.   Allergies  Allergen Reactions   Adhesive [Tape]     itching   Latex Itching    When tape is on the skin too long skin gets red & itching   Patient Active Problem List   Diagnosis Date Noted   Chronic meniscal tear of knee 03/15/2021   Dysphagia 04/17/2020   LVH (left ventricular hypertrophy) 11/07/2019   Heme positive stool 10/03/2019   Anemia 10/03/2019   Insulin dependent type 2 diabetes mellitus ([place]) 08/26/2019   Acute on chronic diastolic heart failure ([place]) 08/26/2019  Rash and nonspecific skin eruption 08/26/2019   Foot pain 08/22/2019   Right heart failure ([place]) 08/07/2019   Leg swelling 07/28/2019   Educated about COVID-19 virus infection 07/28/2019   Diabetes ([place]) 06/12/2019   Urticaria 05/15/2019   Dermatitis due to drug 04/18/2019   Edema 03/29/2019   Erectile dysfunction 03/29/2019   High risk medication use 03/29/2019   History of iron deficiency 03/29/2019   Pulmonary hypertension ([place]) 03/29/2019   Venous stasis dermatitis of both lower extremities 03/29/2019   Mixed hyperlipidemia 02/14/2019   Onychomycosis due to dermatophyte 11/06/2018   Hammer toe of right foot 07/17/2018   Tinea pedis of both feet 07/17/2018   History of radiofrequency ablation (RFA) procedure for cardiac arrhythmia 06/21/2018   Lumbar back pain with radiculopathy affecting lower extremity 06/21/2018   Hepatic cirrhosis ([place]) 12/26/2017   Left lower quadrant pain 12/05/2017   Change in bowel habits 12/05/2017   Palpitations 10/03/2017   SOB (shortness of breath) 04/12/2017   Chest pain with moderate risk of acute coronary syndrome 08/28/2016   Unstable angina ([place]) 08/26/2016   Longstanding persistent atrial fibrillation ([place]) 04/21/2015   Deviated nasal septum 02/19/2014    History of colon polyps 11/22/2012   Syncope 10/17/2012   Acute asthmatic bronchitis 11/22/2011   Benign neoplasm of colon 10/13/2011   Unspecified gastritis and gastroduodenitis without mention of hemorrhage 10/13/2011   GERD (gastroesophageal reflux disease) 10/13/2011   Obesity (BMI 30-39.9) 09/30/2011   Dyslipidemia 06/23/2011   Neuropathy 06/23/2011   Fatigue 05/13/2011   Malaise and fatigue 05/13/2011   Rectal bleeding 04/18/2011   Atrial flutter ([place]) 04/18/2011   Chronic anticoagulation 11/10/2010   PAF (paroxysmal atrial fibrillation) ([facility])    Tobacco abuse    COPD (chronic obstructive pulmonary disease) ([place]) 02/11/2010   Essential hypertension 01/28/2010   OSA (obstructive sleep apnea) 01/28/2010   Social History   Socioeconomic History   Marital status: Married    Spouse name: Not on file   Number of children: 2   Years of education: Not on file   Highest education level: Not on file  Occupational History   Occupation: Dealer   Tobacco Use   Smoking status: Every Day    Packs/day: 1.00    Years: 49.00    Pack years: 49.00    Types: Cigarettes   Smokeless tobacco: Never   Tobacco comments:    1/2 pack per day 10/15/20  Vaping Use   Vaping Use: Never used  Substance and Sexual Activity   Alcohol use: Not Currently    Alcohol/week: 0.0 standard drinks    Comment: 12/05/17 pt stated he has drinked 1/2 drink in 3 years   Drug use: No   Sexual activity: Yes  Other Topics Concern   Not on file  Social History Narrative   Daily caffeine(Mountain Dew)       Lives in [place] with spouse.   Unemployed due to chronic back/ leg pain         Social Determinants of Health   Financial Resource Strain: Not on file  Food Insecurity: Not on file  Transportation Needs: Not on file  Physical Activity: Not on file  Stress: Not on file  Social Connections: Not on file  Intimate Partner Violence: Not on file    Mr. Throgmorton family history includes  Diabetes in his maternal grandmother and mother; Heart disease in his father; Lung cancer in his father.      Objective:    Vitals:  03/18/21 0950  BP: (!) 112/56  Pulse: 84    Physical Exam Well-developed somewhat chronically ill-appearing older white male in no acute distress.  Height, Weight, 261 BMI 34.45 ambulates with some difficulty  HEENT; nontraumatic normocephalic, EOMI, PE R LA, sclera anicteric. Oropharynx; tongue benign, no oral thrush, difficult to visualize uvula completely but he does appear to have a very elongated uvula Neck; supple, no JVD Cardiovascular; regular rate and rhythm with S1-S2, no murmur rub or gallop Pulmonary; scattered rhonchi Abdomen; soft, obese nontender, nondistended, no palpable mass or hepatosplenomegaly, bowel sounds are active Rectal; not done today Skin; benign exam, no jaundice rash or appreciable lesions Extremities; no clubbing cyanosis or edema skin warm and dry Neuro/Psych; alert and oriented x4, grossly nonfocal mood and affect appropriate        Assessment & Plan:   #53 65 year old white male with history of chronic GERD, and complaints of intermittent solid food dysphagia somewhat progressive over the past couple of months. Fairly recent EGD in June 2021 with no evidence of esophageal stricture. 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  COPD 8.  Sleep apnea 9.  Adult onset diabetes mellitus 10.  Atrial fib flutter-on Coumadin  Plan; we discussed cutting food into very small pieces and moistening food, also advised sips of liquids between bites of  food.  I think that the fact he is edentulous makes it more likely that he is trying to swallow larger pieces. Will schedule for barium swallow with a tablet Continue Protonix 20 mg p.o. twice daily AC Pending results of barium swallow can decide if EGD with esophageal dilation indicated.  He would need to come off Coumadin prior to any consideration of esophageal dilation. I explained Dr. Pollie Friar findings to him which he had not previously understood Patient is also complaining of difficulty urinating and emptying his bladder, occasional urinary incontinence.  Will refer to urology.   Aleshka Corney S Annamae Shivley PA-C 03/18/2021   Cc: No ref. provider found

## 2021-03-19 ENCOUNTER — Telehealth: Payer: Self-pay

## 2021-03-19 NOTE — Progress Notes (Signed)
I agree with the above note, plan 

## 2021-03-19 NOTE — Telephone Encounter (Signed)
Patient called and is starting Ceftin today 11/4 for 2 weeks.  I scheduled INR check for 11/10.

## 2021-03-19 NOTE — Telephone Encounter (Signed)
Done

## 2021-03-24 ENCOUNTER — Telehealth: Payer: Self-pay

## 2021-03-24 NOTE — Telephone Encounter (Signed)
   [facility] Pre-operative Risk Assessment    Patient Name: [name]  DOB: [birth year] MRN: [id number]  HEARTCARE STAFF:  - IMPORTANT!!!!!! Under Visit Info/Reason for Call, type in Other and utilize the format Clearance MM/DD/YY or Clearance TBD. Do not use dashes or single digits. - Please review there is not already an duplicate clearance open for this procedure. - If request is for dental extraction, please clarify the # of teeth to be extracted. - If the patient is currently at the dentist's office, call Pre-Op Callback Staff (MA/nurse) to input urgent request.  - If the patient is not currently in the dentist office, please route to the Pre-Op pool.  Request for surgical clearance:  What type of surgery is being performed? Knee arthroscopy  When is this surgery scheduled? TBD  What type of clearance is required (medical clearance vs. Pharmacy clearance to hold med vs. Both)? Both  Are there any medications that need to be held prior to surgery and how long? Warfarin  Practice name and name of physician performing surgery? [facility]  What is the office phone number? [phone]   7.   What is the office fax number? [phone]  8.   Anesthesia type (None, local, MAC, general) ? Choice   Orvan July 03/24/2021, 12:11 PM  _________________________________________________________________   (provider comments below)

## 2021-03-24 NOTE — Telephone Encounter (Signed)
Pharmacy, can you please comment on how long Coumadin can be held for upcoming knee procedure?  Thank you!

## 2021-03-25 ENCOUNTER — Other Ambulatory Visit: Payer: Self-pay

## 2021-03-25 ENCOUNTER — Ambulatory Visit (INDEPENDENT_AMBULATORY_CARE_PROVIDER_SITE_OTHER): Payer: 59

## 2021-03-25 DIAGNOSIS — I480 Paroxysmal atrial fibrillation: Secondary | ICD-10-CM

## 2021-03-25 DIAGNOSIS — Z5181 Encounter for therapeutic drug level monitoring: Secondary | ICD-10-CM | POA: Diagnosis not present

## 2021-03-25 DIAGNOSIS — Z7901 Long term (current) use of anticoagulants: Secondary | ICD-10-CM | POA: Diagnosis not present

## 2021-03-25 LAB — POCT INR: INR: 1.9 — AB (ref 2.0–3.0)

## 2021-03-25 NOTE — Patient Instructions (Signed)
Take 2 tablets tonight only and then continue taking 1 tablet daily except for 1/2 tablet every Monday, Wednesday and Friday. Repeat INR  in 3 weeks.

## 2021-03-26 NOTE — Telephone Encounter (Signed)
Patient with diagnosis of atrial fibrillation on warfarin for anticoagulation.    Procedure: knee arthroscopy Date of procedure: TBD   CHA2DS2-VASc Score = 2   This indicates a 2.2% annual risk of stroke. The patient's score is based upon: CHF History: 0 HTN History: 1 Diabetes History: 1 Stroke History: 0 Vascular Disease History: 0 Age Score: 0 Gender Score: 0  CrCl 81 (with adjusted body weight) Platelet count 129  Per office protocol, patient can hold warfarin for 5 days prior to procedure.   Patient will not need bridging with Lovenox (enoxaparin) around procedure.  For orthopedic procedures please be sure to resume therapeutic (not prophylactic) dosing.  Patient INR followed by [place] office.  [facility] aware patent will need to provide date once procedure is scheduled.

## 2021-03-29 NOTE — Telephone Encounter (Signed)
Per [name], vascular tech: when the patient came for his appt. 10/31 The tech got him in the room  to begin study .Marland KitchenMarland KitchenHe refused test due to intense back pain. [name] reports possible neurogenic issues that could be contributing to his leg pains.Marland KitchenMarland KitchenSo the Tech talked with [name].

## 2021-03-29 NOTE — Telephone Encounter (Signed)
I will send message to [facility] scheduler to call pt with appt LE Extremity dopplers that were ordered by [facility].

## 2021-03-29 NOTE — Telephone Encounter (Signed)
   Name: [name]  DOB: 1956-01-12  MRN: [id number]   Primary Cardiologist: Minus Breeding, MD  Chart reviewed as part of pre-operative protocol coverage. Patient was contacted 03/29/2021 in reference to pre-operative risk assessment for pending surgery as outlined below.  [name] was last seen on 03/10/21 by [name], PA-C. History reviewed. Main cardiac issues include atrial fib/flutter, elevated PASP. Prior normal coronaries 2018. Prior history of leg pain with normal ABIs/dopplers 01/2020. He had recently come in complaining of worsening leg pain. [name] ordered repeat noninvasive vascular studies but per tech notes the patient did not want to complete these due to significant back pain. [name]'s note does suggest the patient may need to see pain management and neurosurgery at a later time to see.  Reached out to patient but got VM. LMTCB.   [name], PA-C 03/29/2021, 5:04 PM

## 2021-03-29 NOTE — Telephone Encounter (Signed)
[name] sent msg to [name] below, but [name] is now out on PAL for now. Will route to callback to see if they can help facilitate getting the duplex set up so we can weigh in on finalized clearance (at last OV 03/10/21 [name] recommended LE arterial dopplers/ABIs but it appears in appt notes pt cancelled study). Would recommend obtaining prior to clearance.

## 2021-03-30 NOTE — Telephone Encounter (Signed)
Patient returned call

## 2021-03-31 NOTE — Telephone Encounter (Signed)
   Primary Cardiologist: Minus Breeding, MD  Chart reviewed as part of pre-operative protocol coverage. Given past medical history and time since last visit, based on ACC/AHA guidelines, KEMPER HEUPEL would be at acceptable risk for the planned procedure without further cardiovascular testing.   Patient with diagnosis of atrial fibrillation on warfarin for anticoagulation.     Procedure: knee arthroscopy Date of procedure: TBD     CHA2DS2-VASc Score = 2   This indicates a 2.2% annual risk of stroke. The patient's score is based upon: CHF History: 0 HTN History: 1 Diabetes History: 1 Stroke History: 0 Vascular Disease History: 0 Age Score: 0 Gender Score: 0   CrCl 81 (with adjusted body weight) Platelet count 129   Per office protocol, patient can hold warfarin for 5 days prior to procedure.   Patient will not need bridging with Lovenox (enoxaparin) around procedure.  For orthopedic procedures please be sure to resume therapeutic (not prophylactic) dosing.   Patient INR followed by [place] office.  [facility] aware patent will need to provide date once procedure is scheduled.  Patient was advised that if he develops new symptoms prior to surgery to contact our office to arrange a follow-up appointment.  He verbalized understanding.  I will route this recommendation to the requesting party via Epic fax function and remove from pre-op pool.  Please call with questions.  Jossie Ng. Coriann Brouhard NP-C    03/31/2021, 10:21 AM [place] [address] Office [phone] Fax [phone]

## 2021-04-05 ENCOUNTER — Ambulatory Visit (HOSPITAL_COMMUNITY): Admission: RE | Admit: 2021-04-05 | Payer: 59 | Source: Ambulatory Visit

## 2021-04-12 ENCOUNTER — Other Ambulatory Visit: Payer: Self-pay | Admitting: Endocrinology

## 2021-04-12 ENCOUNTER — Other Ambulatory Visit (HOSPITAL_COMMUNITY): Payer: Self-pay

## 2021-04-15 ENCOUNTER — Ambulatory Visit (INDEPENDENT_AMBULATORY_CARE_PROVIDER_SITE_OTHER): Payer: 59 | Admitting: Surgery

## 2021-04-15 ENCOUNTER — Encounter: Payer: Self-pay | Admitting: Surgery

## 2021-04-15 ENCOUNTER — Telehealth: Payer: Self-pay | Admitting: Adult Health

## 2021-04-15 ENCOUNTER — Other Ambulatory Visit: Payer: Self-pay

## 2021-04-15 VITALS — BP 113/64 | HR 71 | Ht 73.0 in | Wt 261.1 lb

## 2021-04-15 DIAGNOSIS — M25562 Pain in left knee: Secondary | ICD-10-CM

## 2021-04-15 DIAGNOSIS — M23204 Derangement of unspecified medial meniscus due to old tear or injury, left knee: Secondary | ICD-10-CM

## 2021-04-15 NOTE — Telephone Encounter (Signed)
disregard

## 2021-04-16 ENCOUNTER — Ambulatory Visit (INDEPENDENT_AMBULATORY_CARE_PROVIDER_SITE_OTHER): Payer: 59

## 2021-04-16 ENCOUNTER — Encounter (HOSPITAL_BASED_OUTPATIENT_CLINIC_OR_DEPARTMENT_OTHER): Payer: Self-pay | Admitting: Orthopaedic Surgery

## 2021-04-16 ENCOUNTER — Telehealth: Payer: Self-pay

## 2021-04-16 DIAGNOSIS — Z5181 Encounter for therapeutic drug level monitoring: Secondary | ICD-10-CM

## 2021-04-16 DIAGNOSIS — I480 Paroxysmal atrial fibrillation: Secondary | ICD-10-CM

## 2021-04-16 DIAGNOSIS — Z7901 Long term (current) use of anticoagulants: Secondary | ICD-10-CM

## 2021-04-16 LAB — POCT INR: INR: 2.2 (ref 2.0–3.0)

## 2021-04-16 NOTE — Progress Notes (Signed)
Pt is pending pulmonology clearance for upcoming sx. Patient has pulmonology appointment scheduled 04/19/21.

## 2021-04-16 NOTE — Patient Instructions (Signed)
continue taking 1 tablet daily except for 1/2 tablet every Monday, Wednesday and Friday. Repeat INR  in 2 weeks.  Knee surgery 12/9  Holding Coumadin 12/4-12/8

## 2021-04-16 NOTE — Telephone Encounter (Signed)
Received denial for surgery from [facility]. Letter scanned into chart under media tab. I set up P2P for 12/6 @ 2:00 or 12/7 @ 10:30 which were the only 2 times they had prior to pts surgery on 12/9.

## 2021-04-19 ENCOUNTER — Encounter: Payer: Self-pay | Admitting: Adult Health

## 2021-04-19 ENCOUNTER — Ambulatory Visit (INDEPENDENT_AMBULATORY_CARE_PROVIDER_SITE_OTHER): Payer: 59

## 2021-04-19 ENCOUNTER — Other Ambulatory Visit: Payer: Self-pay

## 2021-04-19 ENCOUNTER — Telehealth: Payer: Self-pay | Admitting: Orthopaedic Surgery

## 2021-04-19 ENCOUNTER — Ambulatory Visit (INDEPENDENT_AMBULATORY_CARE_PROVIDER_SITE_OTHER): Payer: 59 | Admitting: Adult Health

## 2021-04-19 VITALS — BP 110/53 | HR 94 | Temp 98.4°F | Ht 73.0 in | Wt 257.8 lb

## 2021-04-19 DIAGNOSIS — J449 Chronic obstructive pulmonary disease, unspecified: Secondary | ICD-10-CM

## 2021-04-19 DIAGNOSIS — Z720 Tobacco use: Secondary | ICD-10-CM | POA: Diagnosis not present

## 2021-04-19 DIAGNOSIS — Z01811 Encounter for preprocedural respiratory examination: Secondary | ICD-10-CM

## 2021-04-19 DIAGNOSIS — I480 Paroxysmal atrial fibrillation: Secondary | ICD-10-CM

## 2021-04-19 DIAGNOSIS — Z23 Encounter for immunization: Secondary | ICD-10-CM | POA: Diagnosis not present

## 2021-04-19 DIAGNOSIS — G4733 Obstructive sleep apnea (adult) (pediatric): Secondary | ICD-10-CM

## 2021-04-19 MED ORDER — PREDNISONE 20 MG PO TABS: 20.0000 mg | ORAL_TABLET | Freq: Every day | ORAL | 0 refills | Status: DC

## 2021-04-19 NOTE — Telephone Encounter (Signed)
I left voicemail for patient requesting return call if there is something that I can do for him. I see that he saw Jeneen Rinks last week and that his surgery has been denied by insurance, so peer to peer has been scheduled.

## 2021-04-19 NOTE — Progress Notes (Signed)
@Patient  ID: [name], male    DOB: 1955-11-06, 65 y.o.   MRN: [id number]  Chief Complaint  Patient presents with   Follow-up    Referring provider: No ref. provider found  HPI: 65 year old male active smoker followed for COPD Medical history significant for A. fib status post ablation in 2016 History of obstructive sleep apnea CPAP intolerant  TEST/EVENTS :  Pulmonary function testing Sep 16, 2015 showed moderate COPD with an FEV1 at 65%, ratio 68, FVC 73%, positive bronchodilator response.   Echo 07/2019 - EF 60-65%, Grade 2 DD, Mild LVH , moderately elevated PAP (52.65mmHg), severe dilated LA , RA mod dilated. , TV mild to mod regurgitation ,    ENT for dysphagia.  Underwent laryngoscopy December 2021.  ENT notes showed normal vocal cords and vocal cord mobility.  Some mild edema of the mucosa indicative of reflux.  Upper esophagus was clear on fibrotic laryngoscopy.  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 -follows with ENT     04/19/2021 Follow up: COPD  Patient patient returns for a 107-month follow-up.  Patient has underlying moderate COPD.  He is an active smoker.  Up to a pack a day.  We discussed smoking cessation and the importance of this with patient education given. Patient says overall breathing is doing about the same.  He gets winded with heavy activities.  Has a sedentary lifestyle.  Has multiple comorbidities.  States he gets winded if he tries to carry anything heavy to go upstairs.  Patient has a daily chronic cough with thick mucus.  He denies any discoloration.  Does state that over the last few days he has had a little bit of increased wheezing but this does come and go.  He has had no fever, chest pain, orthopnea or increased leg swelling or hemoptysis.  No unintentional weight loss. He remains on Anoro daily.  Denies any increased albuterol use  Patient does have underlying sleep apnea has been CPAP intolerant .  Patient is not on oxygen. Patient lives at  home with his wife.  He is independent.  Drives. Patient has pulmonary hypertension, diastolic heart failure, history of A. fib is followed by cardiology.  Is on Coumadin.  Patient has been having chronic knee pain.  Has planned upcoming surgery later this week with Dr. Inda Merlin.  Is to undergo arthroscopy.  Here today for a preop pulmonary risk assessment.  He does tell me that his insurance has this currently on hold and has declined coverage so he is not sure he will be able to have this done later this week.  It is to be done at outpatient surgical center.  Allergies  Allergen Reactions   Adhesive [Tape]     itching   Latex Itching    When tape is on the skin too long skin gets red & itching    Immunization History  Administered Date(s) Administered   Influenza Split 01/30/2011, 03/05/2012, 02/13/2013, 02/03/2016, 02/20/2019   Influenza Whole 02/13/2009   Influenza,inj,Quad PF,6+ Mos 03/14/2014, 04/22/2015, 01/19/2016, 02/02/2017, 02/05/2019, 05/01/2020, 04/19/2021   Influenza-Unspecified 01/30/2011, 03/05/2012, 02/13/2013, 03/14/2014, 04/22/2015, 01/19/2016, 02/03/2016, 02/02/2017, 02/13/2018   Pneumococcal Polysaccharide-23 02/14/2007    Past Medical History:  Diagnosis Date   Arthritis    lower back   Asthma    Atrial fibrillation ([place])    Atrial flutter ([place])    s/p CTI ablation by Dr Rayann Heman   Brain aneurysm 2009   questionable. A follow up CTA in 2009 showed  no evidence of   Chronic back pain    DDD/stenosis   Colon polyps    9 polyps removed 1/61/09   Complication of anesthesia 09/11/2012   slow to awaken after ablation   COPD (chronic obstructive pulmonary disease) ([place])    Diabetes mellitus    takes Metformin and Glimepiride daily   Emphysema    GERD (gastroesophageal reflux disease)    takes Omeprazole bid   Heart failure (HCC)    History of shingles    HLD (hyperlipidemia)    takes Pravastatin daily   HTN (hypertension)    takes Prinizide daily   Obesity     OSA (obstructive sleep apnea)    not always using cpap   Overdose 2009   unintentional Flecanide overdose   Peripheral neuropathy    Short-term memory loss    Tobacco abuse     Tobacco History: Social History   Tobacco Use  Smoking Status Every Day   Packs/day: 1.00   Years: 49.00   Pack years: 49.00   Types: Cigarettes  Smokeless Tobacco Never  Tobacco Comments   Smoking a pack per day as of 04/19/21 hfb   Ready to quit: Not Answered Counseling given: Not Answered Tobacco comments: Smoking a pack per day as of 04/19/21 hfb   Outpatient Medications Prior to Visit  Medication Sig Dispense Refill   albuterol (PROAIR HFA) 108 (90 Base) MCG/ACT inhaler Inhale 1-2 puffs into the lungs every 6 (six) hours as needed for wheezing or shortness of breath. 18 g 5   Ascorbic Acid (VITAMIN C PO) Take 500 mg by mouth daily.      b complex vitamins tablet Take 1 tablet by mouth daily.      bisoprolol (ZEBETA) 10 MG tablet Take 10 mg by mouth daily.      Continuous Blood Gluc Receiver (DEXCOM G6 RECEIVER) DEVI      Continuous Blood Gluc Sensor (DEXCOM G6 SENSOR) MISC 1 Device by Does not apply route See admin instructions. Change every 10 days 9 each 3   Continuous Blood Gluc Transmit (DEXCOM G6 TRANSMITTER) MISC USE AS DIRECTED 1 each 3   diltiazem (CARDIZEM CD) 120 MG 24 hr capsule Take 120 mg by mouth daily.     Dulaglutide (TRULICITY) 1.5 UE/4.5WU SOPN Inject 1.5 mg into the skin weekly 2 mL 1   gabapentin (NEURONTIN) 300 MG capsule Take 300 mg by mouth 2 (two) times daily. Patient takes 1 tablet in morning and 2 tablets in evening     insulin aspart (FIASP FLEXTOUCH) 100 UNIT/ML FlexTouch Pen 3 times a day (just before each meal) 90-60-90 units. 240 mL 3   Insulin Pen Needle (B-D UF III MINI PEN NEEDLES) 31G X 5 MM MISC 1 Device by Other route 3 (three) times daily with meals. Use three times daily to inject insulin. 300 each 3   losartan (COZAAR) 50 MG tablet Take 1 tablet (50 mg  total) by mouth daily. 90 tablet 3   metolazone (ZAROXOLYN) 2.5 MG tablet TAKE 1 TABLET BY MOUTH ON TUESDAY AND THURSDAY 30 MINUTES BEFORE TAKING TORSEMIDE OR AS DIRECTED 15 tablet 3   Multiple Vitamin (MULTIVITAMIN WITH MINERALS) TABS Take 1 tablet by mouth daily.      Multiple Vitamins-Minerals (ZINC PO) Take 50 mg by mouth daily.      nitroGLYCERIN (NITROSTAT) 0.4 MG SL tablet Place 1 tablet (0.4 mg total) under the tongue every 5 (five) minutes x 3 doses as needed for chest pain. 25  tablet 3   omeprazole (PRILOSEC) 20 MG capsule Take 1 capsule (20 mg total) by mouth 2 (two) times daily. 60 capsule 11   oxyCODONE-acetaminophen (PERCOCET) 10-325 MG tablet Take 1 tablet by mouth 4 (four) times daily as needed.     potassium chloride SA (KLOR-CON) 20 MEQ tablet TAKE 1 TABLET BY MOUTH DAILY. TAKE AN EXTRA TABLET ON TUESDAY AND THURSDAY 40 tablet 6   pravastatin (PRAVACHOL) 80 MG tablet TAKE 1 TABLET BY MOUTH DAILY AT BEDTIME 30 tablet 10   pregabalin (LYRICA) 75 MG capsule Take 1 capsule (75 mg total) by mouth 2 (two) times daily. 60 capsule 0   tamsulosin (FLOMAX) 0.4 MG CAPS capsule Take 0.4 mg by mouth at bedtime.     torsemide (DEMADEX) 20 MG tablet TAKE 2 TABLETS BY MOUTH 2 TIMES DAILY 360 tablet 2   triamcinolone cream (KENALOG) 0.1 % Apply 1 application topically daily as needed. 453 g 2   umeclidinium-vilanterol (ANORO ELLIPTA) 62.5-25 MCG/INH AEPB Inhale 1 puff into the lungs daily. 60 each 5   VITAMIN A PO Take 2,400 mcg by mouth daily.      Vitamin D, Ergocalciferol, (DRISDOL) 1.25 MG (50000 UT) CAPS capsule Take 50,000 Units by mouth every 7 (seven) days.      warfarin (COUMADIN) 5 MG tablet TAKE 1 TO 1 AND 1/2 TABLETS BY MOUTH DAILY AS DIRECTED BY THE COUMADIN CLINIC 45 tablet 1   No facility-administered medications prior to visit.     Review of Systems:   Constitutional:   No  weight loss, night sweats,  Fevers, chills,  +fatigue, or  lassitude.  HEENT:   No headaches,   Difficulty swallowing,  Tooth/dental problems, or  Sore throat,                No sneezing, itching, ear ache,  +nasal congestion, post nasal drip,   CV:  No chest pain,  Orthopnea, PND, swelling in lower extremities, anasarca, dizziness, palpitations, syncope.   GI  No heartburn, indigestion, abdominal pain, nausea, vomiting, diarrhea, change in bowel habits, loss of appetite, bloody stools.   Resp: .  No chest wall deformity  Skin: no rash or lesions.  GU: no dysuria, change in color of urine, no urgency or frequency.  No flank pain, no hematuria   MS:  No joint pain or swelling.  No decreased range of motion.  No back pain.    Physical Exam  BP (!) 110/53 (BP Location: Left Arm, Patient Position: Sitting, Cuff Size: Normal)   Pulse 94   Temp 98.4 F (36.9 C) (Oral)   Ht 6\' 1"  (1.854 m)   Wt 257 lb 12.8 oz (116.9 kg)   SpO2 95%   BMI 34.01 kg/m   GEN: A/Ox3; pleasant , NAD,    HEENT:  [place]/AT,  NOSE-clear, THROAT-clear, no lesions, no postnasal drip or exudate noted.   NECK:  Supple w/ fair ROM; no JVD; normal carotid impulses w/o bruits; no thyromegaly or nodules palpated; no lymphadenopathy.    RESP  few exp wheezes on forced expiration, speaks in full sentences  no accessory muscle use, no dullness to percussion  CARD:  RRR, no m/r/g, no peripheral edema, pulses intact, no cyanosis or clubbing.  GI:   Soft & nt; nml bowel sounds; no organomegaly or masses detected.   Musco: Warm bil, no deformities or joint swelling noted.   Neuro: alert, no focal deficits noted.    Skin: Warm, no lesions or rashes  Lab Results:    BMET    ProBNP   Imaging: No results found.    PFT Results Latest Ref Rng & Units 09/16/2015  FVC-Pre L 3.57  FVC-Predicted Pre % 64  FVC-Post L 4.09  FVC-Predicted Post % 73  Pre FEV1/FVC % % 67  Post FEV1/FCV % % 68  FEV1-Pre L 2.39  FEV1-Predicted Pre % 56  FEV1-Post L 2.79    No results found for:  NITRICOXIDE      Assessment & Plan:   COPD (chronic obstructive pulmonary disease) (HCC) Mild COPD exacerbation.  Patient has  underlying moderate COPD, he is fully independent and is not oxygen dependent.  Patient is encouraged on smoking cessation. We will treat with a short steroid course over the next 3 days.  Plan  Patient Instructions  Chest xray today.  Prednisone 20mg  daily for 3 days  Working on not smoking -this is your most important goal .   Continue ANORO 1 puff daily. Rinse after use.  Albuterol inhaler 1-2 puffs or neb every 6hr as needed.  Activity as tolerated.  Work on healthy weight loss.  Do not drive if sleepy Flu shot today  Follow up with Dr. Elsworth Soho  In 3 months with PFTs and As needed   Please contact office for sooner follow up if symptoms do not improve or worsen or seek emergency care       Tobacco abuse Smoking cessation discussed in detail   OSA (obstructive sleep apnea) Severe obstructive sleep apnea.  Patient has been CPAP intolerant in the past.  PAF (paroxysmal atrial fibrillation) (HCC) History of diastolic heart failure, A. fib, pulmonary hypertension.  Continue on current regimen and follow-up with cardiology  Preop pulmonary/respiratory exam Planned left knee surgery with Dr. Lorin Mercy  Preop pulmonary risk assessment.  Patient is at least a moderate surgical risk from a pulmonary standpoint.  He has underlying multiple medical comorbidities.  He has underlying moderate COPD.  He is having a very mild flare currently.  He is an active smoker.  Have discussed smoking cessation.  Check chest x-ray today. Patient has underlying COPD and obstructive sleep apnea along with diastolic heart failure A. fib and pulmonary hypertension.  Would recommend that he have his procedure in a hospital setting versus outpatient surgical center. Went over potential pulmonary risk with patient.   Major Pulmonary risks identified in the multifactorial risk  analysis are but not limited to a) pneumonia; b) recurrent intubation risk; c) prolonged or recurrent acute respiratory failure needing mechanical ventilation; d) prolonged hospitalization; e) DVT/Pulmonary embolism; f) Acute Pulmonary edema  Recommend 1. Short duration of surgery as much as possible and avoid paralytic if possible 2. Recovery in step down or ICU with Pulmonary consultation if indicated  3. Coumadin instruction/Anticoagulation therapy per Cardiology  4. Aggressive pulmonary toilet with o2, bronchodilatation, and incentive spirometry and early ambulation.       Rexene Edison, NP 04/19/2021

## 2021-04-19 NOTE — Assessment & Plan Note (Addendum)
History of diastolic heart failure, A. fib, pulmonary hypertension.  Continue on current regimen and follow-up with cardiology

## 2021-04-19 NOTE — Telephone Encounter (Signed)
I called and spoke with patient. He was calling to see if we had any better news in regards to his surgery. I explained that a peer to peer has been set up and we will know more after that is completed. He did advise that his pulmonary doctor recommended he have the surgery at the hospital instead of an outpatient surgical setting. I called Amy and advised. She states that we have to get approval first, then she can submit paperwork to have location changed.

## 2021-04-19 NOTE — Telephone Encounter (Signed)
Pt called stating his knee has been buckling and giving him a lot of pain. Pt states Dr.Yates told him to call and talk with a nurse; pt would like a CB.   [phone]

## 2021-04-19 NOTE — Assessment & Plan Note (Signed)
Mild COPD exacerbation.  Patient has  underlying moderate COPD, he is fully independent and is not oxygen dependent.  Patient is encouraged on smoking cessation. We will treat with a short steroid course over the next 3 days.  Plan  Patient Instructions  Chest xray today.  Prednisone 20mg  daily for 3 days  Working on not smoking -this is your most important goal .   Continue ANORO 1 puff daily. Rinse after use.  Albuterol inhaler 1-2 puffs or neb every 6hr as needed.  Activity as tolerated.  Work on healthy weight loss.  Do not drive if sleepy Flu shot today  Follow up with Dr. Elsworth Soho  In 3 months with PFTs and As needed   Please contact office for sooner follow up if symptoms do not improve or worsen or seek emergency care

## 2021-04-19 NOTE — Assessment & Plan Note (Signed)
Smoking cessation discussed in detail 

## 2021-04-19 NOTE — Telephone Encounter (Signed)
He did advise that his pulmonary doctor recommended he have the surgery at the hospital instead of an outpatient surgical setting. I called Amy and advised. She states that we have to get approval first, then she can submit paperwork to have location changed.

## 2021-04-19 NOTE — Assessment & Plan Note (Signed)
Planned left knee surgery with Dr. Lorin Mercy  Preop pulmonary risk assessment.  Patient is at least a moderate surgical risk from a pulmonary standpoint.  He has underlying multiple medical comorbidities.  He has underlying moderate COPD.  He is having a very mild flare currently.  He is an active smoker.  Have discussed smoking cessation.  Check chest x-ray today. Patient has underlying COPD and obstructive sleep apnea along with diastolic heart failure A. fib and pulmonary hypertension.  Would recommend that he have his procedure in a hospital setting versus outpatient surgical center. Went over potential pulmonary risk with patient.   Major Pulmonary risks identified in the multifactorial risk analysis are but not limited to a) pneumonia; b) recurrent intubation risk; c) prolonged or recurrent acute respiratory failure needing mechanical ventilation; d) prolonged hospitalization; e) DVT/Pulmonary embolism; f) Acute Pulmonary edema  Recommend 1. Short duration of surgery as much as possible and avoid paralytic if possible 2. Recovery in step down or ICU with Pulmonary consultation if indicated  3. Coumadin instruction/Anticoagulation therapy per Cardiology  4. Aggressive pulmonary toilet with o2, bronchodilatation, and incentive spirometry and early ambulation.

## 2021-04-19 NOTE — Telephone Encounter (Signed)
Pt called stating returning call to [facility]. Please call pt at [phone].

## 2021-04-19 NOTE — Assessment & Plan Note (Signed)
Severe obstructive sleep apnea.  Patient has been CPAP intolerant in the past.

## 2021-04-19 NOTE — Patient Instructions (Addendum)
Chest xray today.  Prednisone 20mg  daily for 3 days  Working on not smoking -this is your most important goal .   Continue ANORO 1 puff daily. Rinse after use.  Albuterol inhaler 1-2 puffs or neb every 6hr as needed.  Activity as tolerated.  Work on healthy weight loss.  Do not drive if sleepy Flu shot today  Follow up with Dr. Elsworth Soho  In 3 months with PFTs and As needed   Please contact office for sooner follow up if symptoms do not improve or worsen or seek emergency care

## 2021-04-21 NOTE — Progress Notes (Signed)
ATC x1, LVM to return call.

## 2021-04-21 NOTE — Telephone Encounter (Signed)
Per Dr. Lorin Mercy, he just completed peer to peer and knee arthroscopy has been approved. Sherrie advised and will call patient.

## 2021-04-21 NOTE — Progress Notes (Signed)
Patient returned call and spoke with a staff member. Nothing further needed.

## 2021-04-22 ENCOUNTER — Ambulatory Visit: Payer: 59 | Admitting: Adult Health

## 2021-04-22 ENCOUNTER — Other Ambulatory Visit: Payer: Self-pay

## 2021-04-22 ENCOUNTER — Encounter (HOSPITAL_COMMUNITY): Payer: Self-pay | Admitting: Orthopaedic Surgery

## 2021-04-22 NOTE — Progress Notes (Signed)
Anesthesia Chart Review: Same day workup  Follows with cardiology for history of atrial fib/flutter, elevated PASP.  Prior normal coronaries by cath 2018.  Cardiac clearance per telephone encounter 03/31/2021, "Chart reviewed as part of pre-operative protocol coverage. Given past medical history and time since last visit, based on ACC/AHA guidelines, DERRECK WILTSEY would be at acceptable risk for the planned procedure without further cardiovascular testing.  Patient with diagnosis of atrial fibrillation on warfarin for anticoagulation.Marland KitchenMarland KitchenPer office protocol, patient can hold warfarin for 5 days prior to procedure.  Patient will not need bridging with Lovenox (enoxaparin) around procedure. For orthopedic procedures please be sure to resume therapeutic (not prophylactic) dosing. Patient INR followed by [place] office.  [facility] aware patent will need to provide date once procedure is scheduled."  Follows with pulmonology for history of COPD, OSA intolerant to CPAP.  Pulmonary function test in 2017 showed moderate COPD.  Current smoker, up to a pack a day.  Last seen 04/19/2021 for preop assessment.  Per note, "Planned left knee surgery with Dr. Lorin Mercy  Preop pulmonary risk assessment.  Patient is at least a moderate surgical risk from a pulmonary standpoint.  He has underlying multiple medical comorbidities.  He has underlying moderate COPD.  He is having a very mild flare currently.  He is an active smoker.  Have discussed smoking cessation.  Check chest x-ray today. Patient has underlying COPD and obstructive sleep apnea along with diastolic heart failure A. fib and pulmonary hypertension.  Would recommend that he have his procedure in a hospital setting versus outpatient surgical center. Went over potential pulmonary risk with patient. Major Pulmonary risks identified in the multifactorial risk analysis are but not limited to a) pneumonia; b) recurrent intubation risk; c) prolonged or recurrent acute  respiratory failure needing mechanical ventilation; d) prolonged hospitalization; e) DVT/Pulmonary embolism; f) Acute Pulmonary edema. Recommend 1. Short duration of surgery as much as possible and avoid paralytic if possible 2. Recovery in step down or ICU with Pulmonary consultation if indicated  3. Coumadin instruction/Anticoagulation therapy per Cardiology 4. Aggressive pulmonary toilet with o2, bronchodilatation, and incentive spirometry and early ambulation."  IDDM 2, last A1c in epic 7.6 on 11/09/2020.  Pt reports LD coumadin 12/3.  Patient will need day of surgery labs and evaluation.  EKG 01/06/21: NSR. Rate 78. Nonspecific T wave abnormality. Prolonged QT (Qtc 465).  CHEST - 2 VIEW 04/19/21: COMPARISON:  CT chest 07/23/2020; X-ray chest 12/02/2019.   FINDINGS: Stable cardiomediastinal contours. Atherosclerotic calcification of the aortic knob. Coarsened bilateral perihilar interstitial markings, increased from prior. No focal airspace consolidation. No pleural effusion or pneumothorax.   IMPRESSION: Coarsened bilateral perihilar interstitial markings, increased from prior. Findings may reflect bronchitic type lung changes, mild edema, or atypical/viral infection.   TTE 08/14/2019:  1. Left ventricular ejection fraction, by estimation, is 60 to 65%. The  left ventricle has normal function. The left ventricle has no regional  wall motion abnormalities. There is moderate concentric left ventricular  hypertrophy. Left ventricular  diastolic parameters are consistent with Grade II diastolic dysfunction  (pseudonormalization). Elevated left atrial pressure.   2. Right ventricular systolic function is normal. The right ventricular  size is normal. There is moderately elevated pulmonary artery systolic  pressure. The estimated right ventricular systolic pressure is 64.4 mmHg.   3. Left atrial size was severely dilated.   4. Right atrial size was moderately dilated.   5. The mitral  valve is normal in structure. Mild mitral valve  regurgitation. No  evidence of mitral stenosis.   6. Tricuspid valve regurgitation is mild to moderate.   7. The aortic valve is tricuspid. Aortic valve regurgitation is not  visualized. Mild to moderate aortic valve sclerosis/calcification is  present, without any evidence of aortic stenosis.   8. The inferior vena cava is dilated in size with >50% respiratory  variability, suggesting right atrial pressure of 8 mmHg.   Comparison(s): No significant change from prior study. Prior images  reviewed side by side.   Cath 09/19/2016: Widely patent/normal coronary arteries. Normal left ventricular systolic function with EF greater than 55%. Elevated end-diastolic pressure, consistent with chronic diastolic dysfunction.   RECOMMENDATIONS:   Symptoms are not related to ischemic heart disease. Consider chronic sleep deprivation, PAD, or other explanations.   Wynonia Musty [facility] Short Stay Center/Anesthesiology Phone [phone] 04/22/2021 1:40 PM

## 2021-04-22 NOTE — Progress Notes (Signed)
PCP - Dr. Rex Kras Cardiologist - Dr. Warren Lacy  EKG - 01/06/21 Chest x-ray - 04/29/21 ECHO - 01/06/21 Cardiac Cath - 09/29/16 CPAP -   Fasting Blood Sugar:  175-225 Checks Blood Sugar:  multiple x/day - dexcom  Blood Thinner Instructions: per pt last dose coumadin 04/17/21 Aspirin Instructions:   ERAS Protcol - ERAS 0715  COVID TEST- n/a  Anesthesia review: yes  -------------  SDW INSTRUCTIONS:  Your procedure is scheduled on Friday 12/9 . Please report to [facility] Main Entrance "A" at [email or web address] and check in at the Admitting office. Call this number if you have problems the morning of surgery: [phone]   Remember: Do not eat after midnight the night before your surgery  You may drink clear liquids until 0715 AM the morning of your surgery.   Clear liquids allowed are: Water, Non-Citrus Juices (without pulp), Carbonated Beverages, Clear Tea, Black Coffee Only, and Gatorade   Medications to take morning of surgery with a sip of water include: Inhaler -- Please bring all inhalers with you the day of surgery.  bisoprolol (ZEBETA)  diltiazem (CARDIZEM CD)  gabapentin (NEURONTIN)  omeprazole (PRILOSEC)  oxyCODONE-acetaminophen (PERCOCET) - as needed    ** PLEASE check your blood sugar the morning of your surgery when you wake up and every 2 hours until you get to the Short Stay unit.  If your blood sugar is less than 70 mg/dL, you will need to treat for low blood sugar: Do not take insulin. Treat a low blood sugar (less than 70 mg/dL) with  cup of clear juice (cranberry or apple), 4 glucose tablets, OR glucose gel. Recheck blood sugar in 15 minutes after treatment (to make sure it is greater than 70 mg/dL). If your blood sugar is not greater than 70 mg/dL on recheck, call [phone] for further instructions.  Insulin aspart NONE - CBG >220 take half usual dose    As of today, STOP taking any Aspirin (unless otherwise instructed by your surgeon), Aleve, Naproxen,  Ibuprofen, Motrin, Advil, Goody's, BC's, all herbal medications, fish oil, and all vitamins.    The Morning of Surgery Do not wear jewelry Do not wear lotions, powders, colognes, or deodorant  Do not bring valuables to the hospital. [facility] is not responsible for any belongings or valuables.  If you are a smoker, DO NOT Smoke 24 hours prior to surgery  If you wear a CPAP at night please bring your mask the morning of surgery   Remember that you must have someone to transport you home after your surgery, and remain with you for 24 hours if you are discharged the same day.  Please bring cases for contacts, glasses, hearing aids, dentures or bridgework because it cannot be worn into surgery.   Patients discharged the day of surgery will not be allowed to drive home.   Please shower the NIGHT BEFORE/MORNING OF SURGERY (use antibacterial soap like DIAL soap if possible). Wear comfortable clothes the morning of surgery. Oral Hygiene is also important to reduce your risk of infection.  Remember - BRUSH YOUR TEETH THE MORNING OF SURGERY WITH YOUR REGULAR TOOTHPASTE  Patient denies shortness of breath, fever, cough and chest pain.

## 2021-04-22 NOTE — Anesthesia Preprocedure Evaluation (Addendum)
Anesthesia Evaluation  Patient identified by MRN, date of birth, ID band Patient awake    Reviewed: Allergy & Precautions, NPO status , Patient's Chart, lab work & pertinent test results, reviewed documented beta blocker date and time   Airway Mallampati: II  TM Distance: >3 FB Neck ROM: Full    Dental  (+) Dental Advisory Given   Pulmonary asthma , sleep apnea and Continuous Positive Airway Pressure Ventilation , COPD, Current SmokerPatient did not abstain from smoking.,    + rhonchi        Cardiovascular hypertension, Pt. on home beta blockers and Pt. on medications + angina +CHF  Normal cardiovascular exam+ dysrhythmias Atrial Fibrillation  Rhythm:Regular Rate:Normal     Neuro/Psych  Neuromuscular disease negative psych ROS   GI/Hepatic Neg liver ROS, GERD  Medicated and Controlled,  Endo/Other  diabetes, Type 2, Insulin Dependent, Oral Hypoglycemic AgentsObesity   Renal/GU negative Renal ROS     Musculoskeletal  (+) Arthritis , left knee medial meniscal tear, large Baker's cyst   Abdominal   Peds  Hematology  (+) Blood dyscrasia (Warfarin), ,   Anesthesia Other Findings Day of surgery medications reviewed with the patient.  Reproductive/Obstetrics                            Anesthesia Physical Anesthesia Plan  ASA: 3  Anesthesia Plan: General   Post-op Pain Management: Tylenol PO (pre-op)   Induction: Intravenous  PONV Risk Score and Plan: 2 and Midazolam, Dexamethasone and Ondansetron  Airway Management Planned: LMA  Additional Equipment:   Intra-op Plan:   Post-operative Plan: Extubation in OR  Informed Consent: I have reviewed the patients History and Physical, chart, labs and discussed the procedure including the risks, benefits and alternatives for the proposed anesthesia with the patient or authorized representative who has indicated his/her understanding and  acceptance.     Dental advisory given  Plan Discussed with: CRNA  Anesthesia Plan Comments: (PAT note by Karoline Caldwell, PA-C: Follows with cardiology for history of atrial fib/flutter, elevated PASP.  Prior normal coronaries by cath 2018.  Cardiac clearance per telephone encounter 03/31/2021, "Chart reviewed as part of pre-operative protocol coverage. Given past medical history and time since last visit, based on ACC/AHA guidelines,Ryan D Brunewould be at acceptable risk for the planned procedure without further cardiovascular testing.  Patient with diagnosis ofatrial fibrillationon warfarinfor anticoagulation.Marland KitchenMarland KitchenPer office protocol, patient can holdwarfarinfor 5days prior to procedure. Patientwill not need bridging with Lovenox (enoxaparin) around procedure. For orthopedic procedures please be sure to resume therapeutic (not prophylactic) dosing. Patient INR followed by [place] office. [facility] aware patent will need to provide date once procedure is scheduled."  Follows with pulmonology for history of COPD, OSA intolerant to CPAP.  Pulmonary function test in 2017 showed moderate COPD.  Current smoker, up to a pack a day.  Last seen 04/19/2021 for preop assessment.  Per note, "Planned left knee surgery with Dr. Lorin Mercy Preop pulmonary risk assessment. Patient is at least a moderate surgical risk from a pulmonary standpoint. He has underlying multiple medical comorbidities. He has underlying moderate COPD. He is having a very mild flare currently. He is an active smoker. Have discussed smoking cessation. Check chest x-ray today. Patient has underlying COPD and obstructive sleep apnea along with diastolic heart failure A. fib and pulmonary hypertension. Would recommend that he have his procedure in a hospital setting versus outpatient surgical center. Went over potential pulmonary risk with patient. Major  Pulmonary risks identified in the multifactorial risk analysis are but not  limited to a) pneumonia; b) recurrent intubation risk; c) prolonged or recurrent acute respiratory failure needing mechanical ventilation; d) prolonged hospitalization; e) DVT/Pulmonary embolism; f) Acute Pulmonary edema. Recommend 1. Short duration of surgery as much as possible and avoid paralytic if possible 2. Recovery in step down or ICU with Pulmonary consultationif indicated 3.Coumadin instruction/Anticoagulation therapy per Cardiology 4. Aggressive pulmonary toilet with o2, bronchodilatation, and incentive spirometry and early ambulation."  IDDM 2, last A1c in epic 7.6 on 11/09/2020.  Pt reports LD coumadin 12/3.  Patient will need day of surgery labs and evaluation.  EKG 01/06/21: NSR. Rate 78. Nonspecific T wave abnormality. Prolonged QT (Qtc 465).  CHEST - 2 VIEW 04/19/21: COMPARISON: CT chest 07/23/2020; X-ray chest 12/02/2019.  FINDINGS: Stable cardiomediastinal contours. Atherosclerotic calcification of the aortic knob. Coarsened bilateral perihilar interstitial markings, increased from prior. No focal airspace consolidation. No pleural effusion or pneumothorax.  IMPRESSION: Coarsened bilateral perihilar interstitial markings, increased from prior. Findings may reflect bronchitic type lung changes, mild edema, or atypical/viral infection.  TTE 08/14/2019: 1. Left ventricular ejection fraction, by estimation, is 60 to 65%. The  left ventricle has normal function. The left ventricle has no regional  wall motion abnormalities. There is moderate concentric left ventricular  hypertrophy. Left ventricular  diastolic parameters are consistent with Grade II diastolic dysfunction  (pseudonormalization). Elevated left atrial pressure.  2. Right ventricular systolic function is normal. The right ventricular  size is normal. There is moderately elevated pulmonary artery systolic  pressure. The estimated right ventricular systolic pressure is 81.1 mmHg.  3. Left atrial size  was severely dilated.  4. Right atrial size was moderately dilated.  5. The mitral valve is normal in structure. Mild mitral valve  regurgitation. No evidence of mitral stenosis.  6. Tricuspid valve regurgitation is mild to moderate.  7. The aortic valve is tricuspid. Aortic valve regurgitation is not  visualized. Mild to moderate aortic valve sclerosis/calcification is  present, without any evidence of aortic stenosis.  8. The inferior vena cava is dilated in size with >50% respiratory  variability, suggesting right atrial pressure of 8 mmHg.   Comparison(s): No significant change from prior study. Prior images  reviewed side by side.   Cath 09/19/2016: . Widely patent/normal coronary arteries. . Normal left ventricular systolic function with EF greater than 55%. Elevated end-diastolic pressure, consistent with chronic diastolic dysfunction.  RECOMMENDATIONS:  . Symptoms are not related to ischemic heart disease. . Consider chronic sleep deprivation, PAD, or other explanations.  )       Anesthesia Quick Evaluation

## 2021-04-23 ENCOUNTER — Other Ambulatory Visit: Payer: Self-pay

## 2021-04-23 ENCOUNTER — Ambulatory Visit (HOSPITAL_COMMUNITY)
Admission: RE | Admit: 2021-04-23 | Discharge: 2021-04-23 | Disposition: A | Discharge status: Home / Self Care | Payer: 59 | Attending: Orthopaedic Surgery | Admitting: Orthopaedic Surgery

## 2021-04-23 ENCOUNTER — Encounter (HOSPITAL_COMMUNITY)
Admission: RE | Disposition: A | Discharge status: Home / Self Care | Payer: Self-pay | Source: Home / Self Care | Attending: Orthopaedic Surgery

## 2021-04-23 ENCOUNTER — Ambulatory Visit (HOSPITAL_COMMUNITY): Payer: 59 | Admitting: Physician Assistant

## 2021-04-23 ENCOUNTER — Encounter (HOSPITAL_COMMUNITY): Payer: Self-pay | Admitting: Orthopaedic Surgery

## 2021-04-23 DIAGNOSIS — S83242A Other tear of medial meniscus, current injury, left knee, initial encounter: Secondary | ICD-10-CM | POA: Diagnosis present

## 2021-04-23 DIAGNOSIS — I110 Hypertensive heart disease with heart failure: Secondary | ICD-10-CM | POA: Insufficient documentation

## 2021-04-23 DIAGNOSIS — Y939 Activity, unspecified: Secondary | ICD-10-CM | POA: Diagnosis not present

## 2021-04-23 DIAGNOSIS — I509 Heart failure, unspecified: Secondary | ICD-10-CM | POA: Insufficient documentation

## 2021-04-23 DIAGNOSIS — F17210 Nicotine dependence, cigarettes, uncomplicated: Secondary | ICD-10-CM | POA: Insufficient documentation

## 2021-04-23 DIAGNOSIS — Z7901 Long term (current) use of anticoagulants: Secondary | ICD-10-CM

## 2021-04-23 DIAGNOSIS — Z01818 Encounter for other preprocedural examination: Secondary | ICD-10-CM

## 2021-04-23 DIAGNOSIS — M7122 Synovial cyst of popliteal space [Baker], left knee: Secondary | ICD-10-CM | POA: Insufficient documentation

## 2021-04-23 DIAGNOSIS — I4891 Unspecified atrial fibrillation: Secondary | ICD-10-CM | POA: Diagnosis not present

## 2021-04-23 DIAGNOSIS — J439 Emphysema, unspecified: Secondary | ICD-10-CM | POA: Insufficient documentation

## 2021-04-23 DIAGNOSIS — X58XXXA Exposure to other specified factors, initial encounter: Secondary | ICD-10-CM | POA: Diagnosis not present

## 2021-04-23 HISTORY — PX: KNEE ARTHROSCOPY WITH MEDIAL MENISECTOMY: SHX5651

## 2021-04-23 HISTORY — DX: Heart failure, unspecified: I50.9

## 2021-04-23 HISTORY — DX: Unspecified atrial fibrillation: I48.91

## 2021-04-23 LAB — BASIC METABOLIC PANEL
Anion gap: 12 (ref 5–15)
BUN: 25 mg/dL — ABNORMAL HIGH (ref 8–23)
CO2: 30 mmol/L (ref 22–32)
Calcium: 9.1 mg/dL (ref 8.9–10.3)
Chloride: 98 mmol/L (ref 98–111)
Creatinine, Ser: 1.26 mg/dL — ABNORMAL HIGH (ref 0.61–1.24)
GFR, Estimated: >60 mL/min (ref >60)
Glucose, Bld: 131 mg/dL — ABNORMAL HIGH (ref 70–99)
Potassium: 3.2 mmol/L — ABNORMAL LOW (ref 3.5–5.1)
Sodium: 140 mmol/L (ref 135–145)

## 2021-04-23 LAB — PROTIME-INR
INR: 1.2 (ref 0.8–1.2)
Prothrombin Time: 15.1 seconds (ref 11.4–15.2)

## 2021-04-23 LAB — GLUCOSE, CAPILLARY
Glucose-Capillary: 152 mg/dL — ABNORMAL HIGH (ref 70–99)
Glucose-Capillary: 184 mg/dL — ABNORMAL HIGH (ref 70–99)
Glucose-Capillary: 187 mg/dL — ABNORMAL HIGH (ref 70–99)

## 2021-04-23 LAB — CBC
HCT: 34.4 % — ABNORMAL LOW (ref 39.0–52.0)
Hemoglobin: 11 g/dL — ABNORMAL LOW (ref 13.0–17.0)
MCH: 31 pg (ref 26.0–34.0)
MCHC: 32 g/dL (ref 30.0–36.0)
MCV: 96.9 fL (ref 80.0–100.0)
Platelets: 133 10*3/uL — ABNORMAL LOW (ref 150–400)
RBC: 3.55 MIL/uL — ABNORMAL LOW (ref 4.22–5.81)
RDW: 15.5 % (ref 11.5–15.5)
WBC: 5.5 10*3/uL (ref 4.0–10.5)
nRBC: 0 % (ref 0.0–0.2)

## 2021-04-23 LAB — SURGICAL PCR SCREEN
MRSA, PCR: NEGATIVE
Staphylococcus aureus: NEGATIVE

## 2021-04-23 SURGERY — ARTHROSCOPY, KNEE, WITH MEDIAL MENISCECTOMY
Anesthesia: General | Site: Knee | Laterality: Left

## 2021-04-23 MED ORDER — ONDANSETRON HCL 4 MG/2ML IJ SOLN
INTRAMUSCULAR | Status: AC
  Filled 2021-04-23: qty 2

## 2021-04-23 MED ORDER — FENTANYL CITRATE (PF) 250 MCG/5ML IJ SOLN
INTRAMUSCULAR | Status: AC
  Filled 2021-04-23: qty 5

## 2021-04-23 MED ORDER — LACTATED RINGERS IV SOLN: INTRAVENOUS | Status: DC

## 2021-04-23 MED ORDER — PHENYLEPHRINE HCL-NACL 20-0.9 MG/250ML-% IV SOLN
INTRAVENOUS | Status: DC | PRN
  Administered 2021-04-23: 25 ug/min via INTRAVENOUS

## 2021-04-23 MED ORDER — ACETAMINOPHEN 500 MG PO TABS
1000.0000 mg | ORAL_TABLET | Freq: Once | ORAL | Status: AC
  Administered 2021-04-23: 1000 mg via ORAL
  Filled 2021-04-23: qty 2

## 2021-04-23 MED ORDER — ONDANSETRON HCL 4 MG/2ML IJ SOLN
INTRAMUSCULAR | Status: DC | PRN
  Administered 2021-04-23: 4 mg via INTRAVENOUS

## 2021-04-23 MED ORDER — FENTANYL CITRATE (PF) 250 MCG/5ML IJ SOLN
INTRAMUSCULAR | Status: DC | PRN
  Administered 2021-04-23 (×2): 50 ug via INTRAVENOUS

## 2021-04-23 MED ORDER — BUPIVACAINE-EPINEPHRINE (PF) 0.25% -1:200000 IJ SOLN
INTRAMUSCULAR | Status: AC
  Filled 2021-04-23: qty 30

## 2021-04-23 MED ORDER — KETOROLAC TROMETHAMINE 30 MG/ML IJ SOLN
INTRAMUSCULAR | Status: DC | PRN
  Administered 2021-04-23: 30 mg via INTRAVENOUS

## 2021-04-23 MED ORDER — CHLORHEXIDINE GLUCONATE 0.12 % MT SOLN
OROMUCOSAL | Status: AC
  Administered 2021-04-23: 15 mL
  Filled 2021-04-23: qty 15

## 2021-04-23 MED ORDER — MIDAZOLAM HCL 2 MG/2ML IJ SOLN
INTRAMUSCULAR | Status: AC
  Filled 2021-04-23: qty 2

## 2021-04-23 MED ORDER — SODIUM CHLORIDE 0.9 % IR SOLN
Status: DC | PRN
  Administered 2021-04-23 (×2): 3000 mL

## 2021-04-23 MED ORDER — ONDANSETRON HCL 4 MG/2ML IJ SOLN: 4.0000 mg | Freq: Once | INTRAMUSCULAR | Status: DC | PRN

## 2021-04-23 MED ORDER — BUPIVACAINE-EPINEPHRINE 0.25% -1:200000 IJ SOLN
INTRAMUSCULAR | Status: DC | PRN
  Administered 2021-04-23: 10 mL

## 2021-04-23 MED ORDER — CEFAZOLIN SODIUM-DEXTROSE 2-4 GM/100ML-% IV SOLN
2.0000 g | INTRAVENOUS | Status: AC
  Administered 2021-04-23: 2 g via INTRAVENOUS
  Filled 2021-04-23: qty 100

## 2021-04-23 MED ORDER — DEXAMETHASONE SODIUM PHOSPHATE 10 MG/ML IJ SOLN
INTRAMUSCULAR | Status: DC | PRN
  Administered 2021-04-23: 10 mg via INTRAVENOUS

## 2021-04-23 MED ORDER — LIDOCAINE 2% (20 MG/ML) 5 ML SYRINGE
INTRAMUSCULAR | Status: AC
  Filled 2021-04-23: qty 5

## 2021-04-23 MED ORDER — PROPOFOL 10 MG/ML IV BOLUS
INTRAVENOUS | Status: DC | PRN
  Administered 2021-04-23: 150 mg via INTRAVENOUS
  Administered 2021-04-23: 20 mg via INTRAVENOUS

## 2021-04-23 MED ORDER — PROPOFOL 10 MG/ML IV BOLUS
INTRAVENOUS | Status: AC
  Filled 2021-04-23: qty 20

## 2021-04-23 MED ORDER — LIDOCAINE 2% (20 MG/ML) 5 ML SYRINGE
INTRAMUSCULAR | Status: DC | PRN
  Administered 2021-04-23: 100 mg via INTRAVENOUS

## 2021-04-23 MED ORDER — FENTANYL CITRATE (PF) 100 MCG/2ML IJ SOLN: 25.0000 ug | INTRAMUSCULAR | Status: DC | PRN

## 2021-04-23 SURGICAL SUPPLY — 34 items
BAG COUNTER SPONGE SURGICOUNT (BAG) ×2 IMPLANT
BAG SURGICOUNT SPONGE COUNTING (BAG) ×1
BENZOIN TINCTURE PRP APPL 2/3 (GAUZE/BANDAGES/DRESSINGS) ×3 IMPLANT
BLADE CUDA 5.5 (BLADE) IMPLANT
BLADE EXCALIBUR 4.0MM X 13CM (MISCELLANEOUS) ×1
BLADE EXCALIBUR 4.0X13 (MISCELLANEOUS) ×2 IMPLANT
BNDG ELASTIC 6X5.8 VLCR STR LF (GAUZE/BANDAGES/DRESSINGS) ×3 IMPLANT
BOOTCOVER CLEANROOM LRG (PROTECTIVE WEAR) ×6 IMPLANT
BUR OVAL 6.0 (BURR) IMPLANT
CLOSURE WOUND 1/2 X4 (GAUZE/BANDAGES/DRESSINGS) ×1
COVER SURGICAL LIGHT HANDLE (MISCELLANEOUS) ×3 IMPLANT
CUFF TOURN SGL QUICK 34 (TOURNIQUET CUFF)
CUFF TOURN SGL QUICK 42 (TOURNIQUET CUFF) IMPLANT
CUFF TRNQT CYL 34X4.125X (TOURNIQUET CUFF) IMPLANT
DRAPE ARTHROSCOPY W/POUCH 114 (DRAPES) ×3 IMPLANT
DRSG PAD ABDOMINAL 8X10 ST (GAUZE/BANDAGES/DRESSINGS) ×3 IMPLANT
DURAPREP 26ML APPLICATOR (WOUND CARE) ×3 IMPLANT
GAUZE SPONGE 4X4 12PLY STRL (GAUZE/BANDAGES/DRESSINGS) ×3 IMPLANT
GAUZE XEROFORM 1X8 LF (GAUZE/BANDAGES/DRESSINGS) ×3 IMPLANT
GLOVE SRG 8 PF TXTR STRL LF DI (GLOVE) ×1 IMPLANT
GLOVE SURG ORTHO LTX SZ7.5 (GLOVE) ×3 IMPLANT
GLOVE SURG UNDER POLY LF SZ8 (GLOVE) ×3
GOWN STRL REUS W/ TWL LRG LVL3 (GOWN DISPOSABLE) ×2 IMPLANT
GOWN STRL REUS W/TWL 2XL LVL3 (GOWN DISPOSABLE) IMPLANT
GOWN STRL REUS W/TWL LRG LVL3 (GOWN DISPOSABLE) ×6
KIT TURNOVER KIT B (KITS) ×3 IMPLANT
NEEDLE HYPO 25GX1X1/2 BEV (NEEDLE) IMPLANT
PACK ARTHROSCOPY DSU (CUSTOM PROCEDURE TRAY) ×3 IMPLANT
PAD ARMBOARD 7.5X6 YLW CONV (MISCELLANEOUS) ×6 IMPLANT
PADDING CAST COTTON 6X4 STRL (CAST SUPPLIES) ×3 IMPLANT
SPONGE T-LAP 4X18 ~~LOC~~+RFID (SPONGE) ×3 IMPLANT
STRIP CLOSURE SKIN 1/2X4 (GAUZE/BANDAGES/DRESSINGS) ×2 IMPLANT
TOWEL GREEN STERILE FF (TOWEL DISPOSABLE) ×6 IMPLANT
TUBING ARTHROSCOPY IRRIG 16FT (MISCELLANEOUS) ×3 IMPLANT

## 2021-04-23 NOTE — Anesthesia Procedure Notes (Signed)
Procedure Name: LMA Insertion Date/Time: 04/23/2021 10:31 AM Performed by: Bryson Corona, CRNA Pre-anesthesia Checklist: Patient identified, Emergency Drugs available, Suction available and Patient being monitored Patient Re-evaluated:Patient Re-evaluated prior to induction Oxygen Delivery Method: Circle System Utilized Preoxygenation: Pre-oxygenation with 100% oxygen Induction Type: IV induction Ventilation: Mask ventilation without difficulty LMA: LMA inserted LMA Size: 5.0 Number of attempts: 1 Placement Confirmation: positive ETCO2 Tube secured with: Tape Dental Injury: Teeth and Oropharynx as per pre-operative assessment

## 2021-04-23 NOTE — Interval H&P Note (Signed)
History and Physical Interval Note:  04/23/2021 10:12 AM  [name]  has presented today for surgery, with the diagnosis of left knee medial meniscal tear, large Baker's cyst.  The various methods of treatment have been discussed with the patient and family. After consideration of risks, benefits and other options for treatment, the patient has consented to  Procedure(s): LEFT KNEE ARTHROSCOPY WITH PARTIAL MEDIAL MENISCECTOMY (Left) as a surgical intervention.  The patient's history has been reviewed, patient examined, no change in status, stable for surgery.  I have reviewed the patient's chart and labs.  Questions were answered to the patient's satisfaction.     [name]

## 2021-04-23 NOTE — Op Note (Signed)
Preop diagnosis: Left knee posterior medial meniscal tear, Baker's cyst.  Postop diagnosis: Same  Procedure: Diagnostic and operative arthroscopy left knee partial medial meniscectomy.  Surgeon: Lorin Mercy MD  Assistant: Benjiman Core, PA-C  Anesthesia: General  Procedure: After induction of general anesthesia proximal left leg holder was applied well leg holder for the opposite leg and foot of the bed was brought maximally down.  Prepping with DuraPrep down the tip of toes usual arthroscopy sheets drapes were applied.  Timeout procedure was completed.  Inflow was placed the scope lateral parapatellar tendon portal.  Medial parapatellar tendon portal was used for instrumentation and shaver.  Knee was then pulled insufflated 70 pounds of pressure.  We inspected patellofemoral joint there showed tricompartment degenerative changes.  No flap tears cartilage was present there is some areas of polished bone.  Lateral compartment showed mild fraying of the lateral meniscus no definite tear.  ACL PCL were intact.  Some narrowing of the notch with spurring.  Medial compartment showed complex tear of the posterior meniscus involving the mid body with horizontal and radial components.  Using combination of shaver and straight flat baskets partial meniscectomy was performed back to the meniscal capsular junction and then the gel toe IV needle was placed into the Baker's cyst the trocar was removed and the Baker's cyst was drained.  Initially there was some jellylike fluid from the cyst and then arthroscopic fluid drained.  He was suctioned dry nylon sutures placed in the medial lateral parapatellar tendon portals.  Postop dressing knee immobilizer and transfer the care room.  Patient be placed, follow-up 1 week.

## 2021-04-23 NOTE — Progress Notes (Signed)
65 year old white male with history of left knee pain with medial meniscal tear no mechanical symptoms presented for preop evaluation.  Knee symptoms unchanged from previous visit.  He has been scheduled for left knee arthroscopy with partial medial meniscectomy.  Patient states that he is not sure as to whether or not his insurance is approved the procedure.  Today history and physical performed.  We received preop cardiac clearance.  He is scheduled to see pulmonologist next Thursday.  Patient is a chronic smoker with past medical history significant for atrial fibrillation, COPD emphysema heart failure.  Surgery scheduled at [facility] day surgery.   Plan On exam patient has wheezes and rhonchi bilaterally.  He will need clearance from pulmonologist.  We will proceed with left knee arthroscopy as scheduled once we receive the clearance.  All questions answered.

## 2021-04-23 NOTE — H&P (Signed)
[name] is an 65 y.o. male.   Chief Complaint: Left knee pain mechanical symptoms HPI: 65 year old white male with history of left knee pain with medial meniscal tear no mechanical symptoms presented for preop evaluation.  Knee symptoms unchanged from previous visit.  He has been scheduled for left knee arthroscopy with partial medial meniscectomy.  Patient states that he is not sure as to whether or not his insurance is approved the procedure.  Today history and physical performed.  We received preop cardiac clearance.  He is scheduled to see pulmonologist next Thursday.  Patient is a chronic smoker with past medical history significant for atrial fibrillation, COPD emphysema heart failure.  Surgery scheduled at [facility] day surgery.  Past Medical History:  Diagnosis Date   Arthritis    lower back   Asthma    Atrial fibrillation [facility])    Atrial flutter [facility])    s/p CTI ablation by Dr Rayann Heman   Brain aneurysm 2009   questionable. A follow up CTA in 2009 showed no evidence of   Chronic back pain    DDD/stenosis   Colon polyps    9 polyps removed 5/95/63   Complication of anesthesia 09/11/2012   slow to awaken after ablation   COPD (chronic obstructive pulmonary disease) ([place])    Diabetes mellitus    takes Metformin and Glimepiride daily   Emphysema    GERD (gastroesophageal reflux disease)    takes Omeprazole bid   Heart failure ([place])    History of shingles    HLD (hyperlipidemia)    takes Pravastatin daily   HTN (hypertension)    takes Prinizide daily   Obesity    OSA (obstructive sleep apnea)    not always using cpap   Overdose 2009   unintentional Flecanide overdose   Peripheral neuropathy    Short-term memory loss    Tobacco abuse     Past Surgical History:  Procedure Laterality Date   ANTERIOR CERVICAL DECOMP/DISCECTOMY FUSION  01/04/2012   Procedure: ANTERIOR CERVICAL DECOMPRESSION/DISCECTOMY FUSION 1 LEVEL/HARDWARE REMOVAL;  Surgeon: Ophelia Charter, MD;   Location: [facility];  Service: Neurosurgery;  Laterality: N/A;  explore cervical fusion Cervical six - seven  with removal of codman plate anterior cervical decompression with fusion interbody prothesis plating and bonegraft   APPENDECTOMY  2-22yrs ago   [place] N/A 09/11/2012   PT DID NOT HAVE AN ATRIAL FIBRILLATION ABLATION IN 2014!  ATRIAL FLUTTER ABLATION ONLY   ATRIAL FLUTTER ABLATION  09/11/2012   CTI ablation by Dr Rayann Heman   BIOPSY  11/07/2019   Procedure: BIOPSY;  Surgeon: Milus Banister, MD;  Location: [facility];  Service: Endoscopy;;   CARDIAC CATHETERIZATION  2008   no significant CAD   CARDIOVERSION  05/05/2011   Procedure: CARDIOVERSION;  Surgeon: Lelon Perla, MD;  Location: [place];  Service: Cardiovascular;  Laterality: N/A;   CARDIOVERSION Bilateral 07/26/2012   Procedure: CARDIOVERSION;  Surgeon: Minus Breeding, MD;  Location: [facility] ENDOSCOPY;  Service: Cardiovascular;  Laterality: Bilateral;   CARPAL TUNNEL RELEASE  99/2000   bilateral   COLONOSCOPY WITH PROPOFOL N/A 12/13/2012   Procedure: COLONOSCOPY WITH PROPOFOL;  Surgeon: Milus Banister, MD;  Location: [facility];  Service: Endoscopy;  Laterality: N/A;   ELECTROPHYSIOLOGIC STUDY N/A 04/21/2015   Procedure: Atrial Fibrillation Ablation;  Surgeon: Thompson Grayer, MD;  Location: [place] CV LAB;  Service: Cardiovascular;  Laterality: N/A;   ESOPHAGOGASTRODUODENOSCOPY (EGD) WITH PROPOFOL N/A 11/07/2019   Procedure: ESOPHAGOGASTRODUODENOSCOPY (  EGD) WITH PROPOFOL;  Surgeon: Milus Banister, MD;  Location: [facility];  Service: Endoscopy;  Laterality: N/A;   HERNIA REPAIR     KNEE SURGERY  6-52yrs ago   left   LEFT HEART CATH AND CORONARY ANGIOGRAPHY N/A 09/19/2016   Procedure: Left Heart Cath and Coronary Angiography;  Surgeon: Belva Crome, MD;  Location: [place] CV [facility];  Service: Cardiovascular;  Laterality: N/A;   Left inguinal hernia repair     as a child   NASAL SEPTOPLASTY W/  TURBINOPLASTY Bilateral 02/19/2014   Procedure: NASAL SEPTOPLASTY WITH BILATERAL TURBINATE REDUCTION;  Surgeon: Jodi Marble, MD;  Location: [place];  Service: ENT;  Laterality: Bilateral;   NECK SURGERY  76yrs ago   right shoulder surgery  4-83yrs ago   cyst removed   TEE WITHOUT CARDIOVERSION  05/05/2011   Procedure: TRANSESOPHAGEAL ECHOCARDIOGRAM (TEE);  Surgeon: Lelon Perla, MD;  Location: [facility] ENDOSCOPY;  Service: Cardiovascular;  Laterality: N/A;   TEE WITHOUT CARDIOVERSION N/A 04/21/2015   Procedure: TRANSESOPHAGEAL ECHOCARDIOGRAM (TEE);  Surgeon: Sueanne Margarita, MD;  Location[facility] ENDOSCOPY;  Service: Cardiovascular;  Laterality: N/A;   THROAT SURGERY  4-59yrs ago   "thought " it was cancer but came back not   TONSILLECTOMY      Family History  Problem Relation Age of Onset   Diabetes Mother    Heart disease Father    Lung cancer Father    Diabetes Maternal Grandmother    Colon cancer Neg Hx    Anesthesia problems Neg Hx    Hypotension Neg Hx    Malignant hyperthermia Neg Hx    Pseudochol deficiency Neg Hx    Esophageal cancer Neg Hx    Pancreatic cancer Neg Hx    Stomach cancer Neg Hx    Social History:  reports that he has been smoking cigarettes. He has a 49.00 pack-year smoking history. He has never used smokeless tobacco. He reports that he does not currently use alcohol. He reports that he does not use drugs.  Allergies:  Allergies  Allergen Reactions   Adhesive [Tape]     itching   Latex Itching    When tape is on the skin too long skin gets red & itching    No medications prior to admission.    No results found for this or any previous visit (from the past 48 hour(s)). No results found.  Review of Systems  Constitutional:  Positive for activity change.  Respiratory:  Positive for cough and shortness of breath.   Cardiovascular: Negative.   Gastrointestinal: Negative.   Genitourinary: Negative.   Musculoskeletal:  Positive for gait problem and joint  swelling.   Height 6\' 1"  (1.854 m), weight 111.1 kg. Physical Exam HENT:     Head: Normocephalic and atraumatic.     Nose: Nose normal.  Eyes:     Extraocular Movements: Extraocular movements intact.  Pulmonary:     Breath sounds: Wheezing (Inspiratory and expiratory) and rhonchi present.  Musculoskeletal:        General: Tenderness present.  Neurological:     Mental Status: He is alert and oriented to person, place, and time.  Psychiatric:        Mood and Affect: Mood normal.     Assessment/Plan Left knee medial meniscal tear.  Advised patient that with the way that his lungs sound today that we will definitely need clearance from pulmonologist.  I asked our surgery scheduler to send a clearance form.  All questions  answered.  Benjiman Core, PA-C 04/23/2021, 6:07 AM

## 2021-04-23 NOTE — Discharge Instructions (Addendum)
Remove dressing 48 hours postop, shower and apply bandaids to incisions.  Begin range of motion exercises, but nothing aggressive.  Weight bear as tolerated and wean off crutches as comfort allows.  Elevate foot above heart level as much as possible.    Ice knee off and on as needed.   No driving

## 2021-04-23 NOTE — Transfer of Care (Signed)
Immediate Anesthesia Transfer of Care Note  Patient: [name]  Procedure(s) Performed: LEFT KNEE ARTHROSCOPY WITH PARTIAL MEDIAL MENISCECTOMY (Left: Knee)  Patient Location: PACU  Anesthesia Type:General  Level of Consciousness: awake, alert , oriented and patient cooperative  Airway & Oxygen Therapy: Patient Spontanous Breathing and Patient connected to face mask oxygen  Post-op Assessment: Report given to RN and Post -op Vital signs reviewed and stable  Post vital signs: Reviewed and stable  Last Vitals:  Vitals Value Taken Time  BP 144/83 04/23/21 1134  Temp    Pulse 61 04/23/21 1137  Resp 8 04/23/21 1137  SpO2 99 % 04/23/21 1137  Vitals shown include unvalidated device data.  Last Pain:  Vitals:   04/23/21 0805  TempSrc:   PainSc: 7          Complications: No notable events documented.

## 2021-04-25 NOTE — Anesthesia Postprocedure Evaluation (Signed)
Anesthesia Post Note  Patient: [name]  Procedure(s) Performed: LEFT KNEE ARTHROSCOPY WITH PARTIAL MEDIAL MENISCECTOMY (Left: Knee)     Patient location during evaluation: PACU Anesthesia Type: General Level of consciousness: awake and alert Pain management: pain level controlled Vital Signs Assessment: post-procedure vital signs reviewed and stable Respiratory status: spontaneous breathing, nonlabored ventilation, respiratory function stable and patient connected to nasal cannula oxygen Cardiovascular status: blood pressure returned to baseline and stable Postop Assessment: no apparent nausea or vomiting Anesthetic complications: no   No notable events documented.  Last Vitals:  Vitals:   04/23/21 1144 04/23/21 1155  BP:  (!) 163/75  Pulse: 69 64  Resp: 19 11  Temp:    SpO2: 95% 96%    Last Pain:  Vitals:   04/23/21 1144  TempSrc:   PainSc: 0-No pain                 Catalina Gravel

## 2021-04-26 ENCOUNTER — Encounter (HOSPITAL_COMMUNITY): Payer: Self-pay | Admitting: Orthopaedic Surgery

## 2021-04-26 ENCOUNTER — Other Ambulatory Visit: Payer: Self-pay | Admitting: Orthopaedic Surgery

## 2021-04-26 ENCOUNTER — Telehealth: Payer: Self-pay | Admitting: Orthopaedic Surgery

## 2021-04-26 NOTE — Telephone Encounter (Signed)
FYI  I called patient. He is status post knee arthroscopy on 04/23/2021. He states that he has pain when he moves the knee, but otherwise, it does not bother him. He is elevating at least to the level of his heart and says that the swelling is better than it was previously. He also notes that the pain in his foot that he had prior to surgery is much better and almost gone. He says that the pain medication helps a little. I advised that pain was normal after surgery and explained to keep elevating when down.  He says that he will give it a few more days, he just wanted to be sure that he was not doing anything to hurt his knee worse.

## 2021-04-26 NOTE — Telephone Encounter (Signed)
Pt called in stating he has an appt for 04/29/21 but he's pain levels are the same as before he went in to surgery. He would like a CB to discuss if this normal and if there's anything he can do to help with the pain.   [phone]

## 2021-04-27 NOTE — Telephone Encounter (Signed)
noted 

## 2021-04-29 ENCOUNTER — Ambulatory Visit (HOSPITAL_COMMUNITY)
Admission: RE | Admit: 2021-04-29 | Discharge: 2021-04-29 | Disposition: A | Discharge status: Home / Self Care | Payer: 59 | Source: Ambulatory Visit | Attending: Surgery | Admitting: Surgery

## 2021-04-29 ENCOUNTER — Encounter: Payer: Self-pay | Admitting: Surgery

## 2021-04-29 ENCOUNTER — Other Ambulatory Visit: Payer: Self-pay

## 2021-04-29 ENCOUNTER — Ambulatory Visit (INDEPENDENT_AMBULATORY_CARE_PROVIDER_SITE_OTHER): Payer: 59 | Admitting: Surgery

## 2021-04-29 VITALS — BP 138/60 | HR 80 | Ht 73.0 in | Wt 247.0 lb

## 2021-04-29 DIAGNOSIS — Z9889 Other specified postprocedural states: Secondary | ICD-10-CM | POA: Diagnosis present

## 2021-04-29 DIAGNOSIS — M79662 Pain in left lower leg: Secondary | ICD-10-CM | POA: Diagnosis present

## 2021-04-29 NOTE — Progress Notes (Signed)
VASCULAR LAB    Left lower extremity venous duplex has been performed.  See CV proc for preliminary results.  Called report   Mateen Franssen, RVT 04/29/2021, 11:45 AM

## 2021-05-03 ENCOUNTER — Telehealth: Payer: Self-pay | Admitting: Cardiology

## 2021-05-03 NOTE — Telephone Encounter (Signed)
Spoke with patient regarding leg swelling. He reported his 2 week post-surgical left leg is swollen from the knee to ankle with slight swelling in the rt ankle. He has not used his cooling pad much because it causes more pain and he is not able to elevate his leg when laying down because his back hurts and he cannot lay on his back. He takes percocet every five hours. He takes warfarin as ordered. He missed his last coumadin clinic appointment. He wanted to take an extra dose of metolazone. Recommended to patient that he use the cooling pad, when on his side, to place a pillow between his legs, take pain medication and fluid pills as ordered (no extra doses due to kidney function), and to call his orthopedic surgeon for better pain control (7/10 when lying down, worse when up). He voiced understanding of this conversation.

## 2021-05-03 NOTE — Telephone Encounter (Signed)
Pt c/o swelling: STAT is pt has developed SOB within 24 hours  How much weight have you gained and in what time span? NO WEIGHT GAIN  If swelling, where is the swelling located? BILATERAL FEET/LEG SWELLING  Are you currently taking a fluid pill? YES, PT WANTS TO KNOW IF HE CAN TAKE ANOTHER FLUID PILL  Are you currently SOB? NO  Do you have a log of your daily weights (if so, list)? NO WEIGHT GAIN  Have you gained 3 pounds in a day or 5 pounds in a week? N/A  Have you traveled recently? NO  PT'S FEET/LEGS HAVE BEEN SWOLLEN FOR 2 WEEKS SINCE HE HAD HIS KNEE SURGERY

## 2021-05-06 ENCOUNTER — Telehealth: Payer: Self-pay

## 2021-05-06 NOTE — Telephone Encounter (Signed)
I called and spoke to pt's wife and tried to schedule INR check, but patient is sick.  They will call next week to schedule.

## 2021-05-11 ENCOUNTER — Ambulatory Visit (INDEPENDENT_AMBULATORY_CARE_PROVIDER_SITE_OTHER): Payer: 59 | Admitting: Orthopaedic Surgery

## 2021-05-11 ENCOUNTER — Other Ambulatory Visit: Payer: Self-pay

## 2021-05-11 ENCOUNTER — Encounter: Payer: Self-pay | Admitting: Orthopaedic Surgery

## 2021-05-11 VITALS — BP 116/57 | HR 75 | Ht 73.0 in | Wt 247.0 lb

## 2021-05-11 DIAGNOSIS — M23204 Derangement of unspecified medial meniscus due to old tear or injury, left knee: Secondary | ICD-10-CM

## 2021-05-11 NOTE — Progress Notes (Signed)
Post-Op Visit Note   Patient: [name]           Date of Birth: [birth year]           MRN: [id number] Visit Date: 05/11/2021 PCP: Pcp, No   Assessment & Plan: Post left knee arthroscopy partial meniscectomy.  Swelling is down he has had some tightness in the back of his knee and mild popliteal swelling.  Doppler test was done postop 04/29/2021 and was negative.  He is walking better.  He is going to resume work but mostly just doing supervising work from a chair.  I will check him back again in a month he has noticed improvement from his preop status.  Chief Complaint:  Chief Complaint  Patient presents with   Left Knee - Pain    04/23/2021 left knee arthroscopy, PMM   Visit Diagnoses:  1. Old complex tear of medial meniscus of left knee     Plan: Recheck 1 month.  Continue intermittent ice and he will gently work on extension and leg lifts straight leg raising.  Follow-Up Instructions: Return in about 1 month (around 06/11/2021).   Orders:  No orders of the defined types were placed in this encounter.  No orders of the defined types were placed in this encounter.   Imaging: No results found.  PMFS History: Patient Active Problem List   Diagnosis Date Noted   Acute medial meniscal tear, left, initial encounter    Preop pulmonary/respiratory exam 04/19/2021   Chronic meniscal tear of knee 03/15/2021   Dysphagia 04/17/2020   LVH (left ventricular hypertrophy) 11/07/2019   Heme positive stool 10/03/2019   Anemia 10/03/2019   Insulin dependent type 2 diabetes mellitus ([place]) 08/26/2019   Acute on chronic diastolic heart failure ([place]) 08/26/2019   Rash and nonspecific skin eruption 08/26/2019   Foot pain 08/22/2019   Right heart failure ([place]) 08/07/2019   Leg swelling 07/28/2019   Educated about COVID-19 virus infection 07/28/2019   Diabetes ([place]) 06/12/2019   Urticaria 05/15/2019   Dermatitis due to drug 04/18/2019   Edema 03/29/2019   Erectile dysfunction  03/29/2019   High risk medication use 03/29/2019   History of iron deficiency 03/29/2019   Pulmonary hypertension ([place]) 03/29/2019   Venous stasis dermatitis of both lower extremities 03/29/2019   Mixed hyperlipidemia 02/14/2019   Onychomycosis due to dermatophyte 11/06/2018   Hammer toe of right foot 07/17/2018   Tinea pedis of both feet 07/17/2018   History of radiofrequency ablation (RFA) procedure for cardiac arrhythmia 06/21/2018   Lumbar back pain with radiculopathy affecting lower extremity 06/21/2018   Hepatic cirrhosis ([place]) 12/26/2017   Left lower quadrant pain 12/05/2017   Change in bowel habits 12/05/2017   Palpitations 10/03/2017   SOB (shortness of breath) 04/12/2017   Chest pain with moderate risk of acute coronary syndrome 08/28/2016   Unstable angina ([place]) 08/26/2016   Longstanding persistent atrial fibrillation ([place]) 04/21/2015   Deviated nasal septum 02/19/2014   History of colon polyps 11/22/2012   Syncope 10/17/2012   Acute asthmatic bronchitis 11/22/2011   Benign neoplasm of colon 10/13/2011   Unspecified gastritis and gastroduodenitis without mention of hemorrhage 10/13/2011   GERD (gastroesophageal reflux disease) 10/13/2011   Obesity (BMI 30-39.9) 09/30/2011   Dyslipidemia 06/23/2011   Neuropathy 06/23/2011   Fatigue 05/13/2011   Malaise and fatigue 05/13/2011   Rectal bleeding 04/18/2011   Atrial flutter ([place]) 04/18/2011   Chronic anticoagulation 11/10/2010   PAF (paroxysmal atrial fibrillation) ([place])  Tobacco abuse    COPD (chronic obstructive pulmonary disease) ([place]) 02/11/2010   Essential hypertension 01/28/2010   OSA (obstructive sleep apnea) 01/28/2010   Past Medical History:  Diagnosis Date   Arthritis    lower back   Asthma    Atrial fibrillation [facility])    Atrial flutter ([place])    s/p CTI ablation by Dr Rayann Heman   Brain aneurysm 2009   questionable. A follow up CTA in 2009 showed no evidence of   Chronic back pain    DDD/stenosis    Colon polyps    9 polyps removed 1/61/09   Complication of anesthesia 09/11/2012   slow to awaken after ablation   COPD (chronic obstructive pulmonary disease) ([place])    Diabetes mellitus    takes Metformin and Glimepiride daily   Emphysema    GERD (gastroesophageal reflux disease)    takes Omeprazole bid   Heart failure ([facility])    History of shingles    HLD (hyperlipidemia)    takes Pravastatin daily   HTN (hypertension)    takes Prinizide daily   Obesity    OSA (obstructive sleep apnea)    not always using cpap   Overdose 2009   unintentional Flecanide overdose   Peripheral neuropathy    Short-term memory loss    Tobacco abuse     Family History  Problem Relation Age of Onset   Diabetes Mother    Heart disease Father    Lung cancer Father    Diabetes Maternal Grandmother    Colon cancer Neg Hx    Anesthesia problems Neg Hx    Hypotension Neg Hx    Malignant hyperthermia Neg Hx    Pseudochol deficiency Neg Hx    Esophageal cancer Neg Hx    Pancreatic cancer Neg Hx    Stomach cancer Neg Hx     Past Surgical History:  Procedure Laterality Date   ANTERIOR CERVICAL DECOMP/DISCECTOMY FUSION  01/04/2012   Procedure: ANTERIOR CERVICAL DECOMPRESSION/DISCECTOMY FUSION 1 LEVEL/HARDWARE REMOVAL;  Surgeon: Ophelia Charter, MD;  Location: [place] NEURO ORS;  Service: Neurosurgery;  Laterality: N/A;  explore cervical fusion Cervical six - seven  with removal of codman plate anterior cervical decompression with fusion interbody prothesis plating and bonegraft   APPENDECTOMY  2-93yrs ago   [place] N/A 09/11/2012   PT DID NOT HAVE AN ATRIAL FIBRILLATION ABLATION IN 2014!  ATRIAL FLUTTER ABLATION ONLY   ATRIAL FLUTTER ABLATION  09/11/2012   CTI ablation by Dr Rayann Heman   BIOPSY  11/07/2019   Procedure: BIOPSY;  Surgeon: Milus Banister, MD;  [facility] END[facility];  Service: Endoscopy;;   CARDIAC CATHETERIZATION  2008   no significant CAD   CARDIOVERSION  05/05/2011    Procedure: CARDIOVERSION;  Surgeon: Lelon Perla, MD;  Location: [place];  Service: Cardiovascular;  Laterality: N/A;   CARDIOVERSION Bilateral 07/26/2012   Procedure: CARDIOVERSION;  Surgeon: Minus Breeding, MD;  Location: [facility] ENDOSCOPY;  Service: Cardiovascular;  Laterality: Bilateral;   CARPAL TUNNEL RELEASE  99/2000   bilateral   COLONOSCOPY WITH PROPOFOL N/A 12/13/2012   Procedure: COLONOSCOPY WITH PROPOFOL;  Surgeon: Milus Banister, MD;  [facility];  Service: Endoscopy;  Laterality: N/A;   ELECTROPHYSIOLOGIC STUDY N/A 04/21/2015   Procedure: Atrial Fibrillation Ablation;  Surgeon: Thompson Grayer, MD;  Location: [place] CV LAB;  Service: Cardiovascular;  Laterality: N/A;   ESOPHAGOGASTRODUODENOSCOPY (EGD) WITH PROPOFOL N/A 11/07/2019   Procedure: ESOPHAGOGASTRODUODENOSCOPY (EGD) WITH PROPOFOL;  Surgeon: Milus Banister,  MD;  [facility];  Service: Endoscopy;  Laterality: N/A;   HERNIA REPAIR     KNEE ARTHROSCOPY WITH MEDIAL MENISECTOMY Left 04/23/2021   Procedure: LEFT KNEE ARTHROSCOPY WITH PARTIAL MEDIAL MENISCECTOMY;  Surgeon: [name], MD;  Location: [place];  Service: Orthopedics;  Laterality: Left;   KNEE SURGERY  6-23yrs ago   left   LEFT HEART CATH AND CORONARY ANGIOGRAPHY N/A 09/19/2016   Procedure: Left Heart Cath and Coronary Angiography;  Surgeon: Belva Crome, MD;  Location: [place] CV LAB;  Service: Cardiovascular;  Laterality: N/A;   Left inguinal hernia repair     as a child   NASAL SEPTOPLASTY W/ TURBINOPLASTY Bilateral 02/19/2014   Procedure: NASAL SEPTOPLASTY WITH BILATERAL TURBINATE REDUCTION;  Surgeon: Jodi Marble, MD;  Location: [place];  Service: ENT;  Laterality: Bilateral;   NECK SURGERY  24yrs ago   right shoulder surgery  4-61yrs ago   cyst removed   TEE WITHOUT CARDIOVERSION  05/05/2011   Procedure: TRANSESOPHAGEAL ECHOCARDIOGRAM (TEE);  Surgeon: Lelon Perla, MD;  Location: [facility] ENDOSCOPY;  Service: Cardiovascular;   Laterality: N/A;   TEE WITHOUT CARDIOVERSION N/A 04/21/2015   Procedure: TRANSESOPHAGEAL ECHOCARDIOGRAM (TEE);  Surgeon: Sueanne Margarita, MD;  Location: [facility] ENDOSCOPY;  Service: Cardiovascular;  Laterality: N/A;   THROAT SURGERY  4-41yrs ago   "thought " it was cancer but came back not   TONSILLECTOMY     Social History   Occupational History   Occupation: Dealer   Tobacco Use   Smoking status: Every Day    Packs/day: 1.00    Years: 49.00    Pack years: 49.00    Types: Cigarettes   Smokeless tobacco: Never   Tobacco comments:    Smoking a pack per day as of 04/19/21 hfb  Vaping Use   Vaping Use: Never used  Substance and Sexual Activity   Alcohol use: Not Currently    Alcohol/week: 0.0 standard drinks    Comment: 12/05/17 pt stated he has drinked 1/2 drink in 3 years   Drug use: No   Sexual activity: Yes

## 2021-05-14 NOTE — Progress Notes (Signed)
65 year old white male who is 1 week status post left knee arthroscopy debridement returns.  States that he has not been sleeping.  He is complaining of left calf pain and swelling.  Also pain in the left knee.  Denies chest pain shortness of breath.  Exam Arthroscopic portals look good.  No drainage or signs infection.  Sutures removed.  He does have left calf swelling and tenderness with palpation.  Plan Patient was sent for venous Doppler left lower extremity to rule out DVT.  We will await callback report.  He will follow-up with Dr. Lorin Mercy in 1 week for recheck.  Elevate leg is much as possible.

## 2021-05-20 ENCOUNTER — Ambulatory Visit (INDEPENDENT_AMBULATORY_CARE_PROVIDER_SITE_OTHER): Payer: Managed Care, Other (non HMO)

## 2021-05-20 ENCOUNTER — Other Ambulatory Visit: Payer: Self-pay

## 2021-05-20 ENCOUNTER — Ambulatory Visit: Payer: Managed Care, Other (non HMO) | Admitting: Podiatry

## 2021-05-20 ENCOUNTER — Encounter: Payer: Self-pay | Admitting: Podiatry

## 2021-05-20 DIAGNOSIS — Z5181 Encounter for therapeutic drug level monitoring: Secondary | ICD-10-CM | POA: Diagnosis not present

## 2021-05-20 DIAGNOSIS — Z7901 Long term (current) use of anticoagulants: Secondary | ICD-10-CM | POA: Diagnosis not present

## 2021-05-20 DIAGNOSIS — I480 Paroxysmal atrial fibrillation: Secondary | ICD-10-CM | POA: Diagnosis not present

## 2021-05-20 DIAGNOSIS — M7752 Other enthesopathy of left foot: Secondary | ICD-10-CM | POA: Diagnosis not present

## 2021-05-20 LAB — POCT INR: INR: 3 (ref 2.0–3.0)

## 2021-05-20 MED ORDER — TRIAMCINOLONE ACETONIDE 10 MG/ML IJ SUSP
10.0000 mg | Freq: Once | INTRAMUSCULAR | Status: AC
  Administered 2021-05-20: 10 mg

## 2021-05-20 NOTE — Patient Instructions (Signed)
continue taking 1 tablet daily except for 1/2 tablet every Monday, Wednesday and Friday. Repeat INR  in 6 weeks.

## 2021-05-21 NOTE — Progress Notes (Signed)
Subjective:   Patient ID: [name], male   DOB: 66 y.o.   MRN: [id number]   HPI Patient presents stating my left ankle has become very sore and I do not remember specific injury   ROS      Objective:  Physical Exam  Neurovascular status intact exquisite discomfort sinus tarsi left with inflammation fluid buildup     Assessment:  Inflammatory sinus tarsitis left with inflammation fluid     Plan:  H&P x-ray reviewed sterile prep injected the sinus tarsi 3 mg Kenalog 5 mg Xylocaine advised on reduced activity reappoint to recheck  X-ray was negative for signs that there is any kind of a arthritis or fracture associated with the pain and discomfort patient is experiencing

## 2021-05-26 ENCOUNTER — Other Ambulatory Visit: Payer: Self-pay

## 2021-05-26 ENCOUNTER — Ambulatory Visit (INDEPENDENT_AMBULATORY_CARE_PROVIDER_SITE_OTHER): Payer: 59 | Admitting: Endocrinology

## 2021-05-26 VITALS — BP 120/60 | HR 70 | Ht 73.0 in | Wt 249.4 lb

## 2021-05-26 DIAGNOSIS — N1831 Chronic kidney disease, stage 3a: Secondary | ICD-10-CM

## 2021-05-26 DIAGNOSIS — E1122 Type 2 diabetes mellitus with diabetic chronic kidney disease: Secondary | ICD-10-CM

## 2021-05-26 DIAGNOSIS — Z794 Long term (current) use of insulin: Secondary | ICD-10-CM

## 2021-05-26 LAB — POCT GLYCOSYLATED HEMOGLOBIN (HGB A1C): Hemoglobin A1C: 8.1 % — AB (ref 4.0–5.6)

## 2021-05-26 MED ORDER — FIASP FLEXTOUCH 100 UNIT/ML ~~LOC~~ SOPN: PEN_INJECTOR | SUBCUTANEOUS | 3 refills | Status: DC

## 2021-05-26 MED ORDER — TRULICITY 3 MG/0.5ML ~~LOC~~ SOAJ: 3.0000 mg | SUBCUTANEOUS | 3 refills | Status: DC

## 2021-05-26 NOTE — Progress Notes (Signed)
Subjective:    Patient ID: [name], male    DOB: 1956-03-16, 66 y.o.   MRN: [id number]  HPI Pt returns for f/u of diabetes mellitus:  DM type: Insulin-requiring type 2 Dx'ed: 3557 Complications: stage 3 CRI.   Therapy: insulin since 3220, and Trulicity. DKA: never Severe hypoglycemia: once (2021, when a meal was delayed after taking Novolog).  Pancreatitis: never Pancreatic imaging: normal on 2017 CT.  SDOH: he declines insulins other than Novolog/Fiasp.  Other: he takes multiple daily injections; he did not tolerate Jardiance (dehydration) or time-release insulins (rash); metformin dosage is limited by abd pain, and Trulicity by nausea.   Interval history: he took prednisone x 3 days, 1 month ago, for AB.  He then got a steroid injection into the ankle last week.  I reviewed continuous glucose monitor data.  Glucose varies from 160-230.  It is in general highest at [facility] and 3-8PM.  It is lowest at 12MN and 1PM.  He has mild hypoglycemia 1/week, at any time of day.  Pt says he has slightly reduced insulin, due to hypoglycemia.  Past Medical History:  Diagnosis Date   Arthritis    lower back   Asthma    Atrial fibrillation [facility])    Atrial flutter [facility])    s/p CTI ablation by Dr Rayann Heman   Brain aneurysm 2009   questionable. A follow up CTA in 2009 showed no evidence of   Chronic back pain    DDD/stenosis   Colon polyps    9 polyps removed 2/54/27   Complication of anesthesia 09/11/2012   slow to awaken after ablation   COPD (chronic obstructive pulmonary disease) ([place])    Diabetes mellitus    takes Metformin and Glimepiride daily   Emphysema    GERD (gastroesophageal reflux disease)    takes Omeprazole bid   Heart failure ([place])    History of shingles    HLD (hyperlipidemia)    takes Pravastatin daily   HTN (hypertension)    takes Prinizide daily   Obesity    OSA (obstructive sleep apnea)    not always using cpap   Overdose 2009   unintentional Flecanide overdose    Peripheral neuropathy    Short-term memory loss    Tobacco abuse     Past Surgical History:  Procedure Laterality Date   ANTERIOR CERVICAL DECOMP/DISCECTOMY FUSION  01/04/2012   Procedure: ANTERIOR CERVICAL DECOMPRESSION/DISCECTOMY FUSION 1 LEVEL/HARDWARE REMOVAL;  Surgeon: Ophelia Charter, MD;  Location: [place] NEURO ORS;  Service: Neurosurgery;  Laterality: N/A;  explore cervical fusion Cervical six - seven  with removal of codman plate anterior cervical decompression with fusion interbody prothesis plating and bonegraft   APPENDECTOMY  2-63yrs ago   [place] N/A 09/11/2012   PT DID NOT HAVE AN ATRIAL FIBRILLATION ABLATION IN 2014!  ATRIAL FLUTTER ABLATION ONLY   ATRIAL FLUTTER ABLATION  09/11/2012   CTI ablation by Dr Rayann Heman   BIOPSY  11/07/2019   Procedure: BIOPSY;  Surgeon: Milus Banister, MD;  [facility];  Service: Endoscopy;;   CARDIAC CATHETERIZATION  2008   no significant CAD   CARDIOVERSION  05/05/2011   Procedure: CARDIOVERSION;  Surgeon: Lelon Perla, MD;  Location: [place];  Service: Cardiovascular;  Laterality: N/A;   CARDIOVERSION Bilateral 07/26/2012   Procedure: CARDIOVERSION;  Surgeon: Minus Breeding, MD;  Location: [facility] ENDOSCOPY;  Service: Cardiovascular;  Laterality: Bilateral;   CARPAL TUNNEL RELEASE  99/2000   bilateral   COLONOSCOPY WITH  PROPOFOL N/A 12/13/2012   Procedure: COLONOSCOPY WITH PROPOFOL;  Surgeon: Milus Banister, MD;  [facility];  Service: Endoscopy;  Laterality: N/A;   ELECTROPHYSIOLOGIC STUDY N/A 04/21/2015   Procedure: Atrial Fibrillation Ablation;  Surgeon: Thompson Grayer, MD;  Location: [place] CV LAB;  Service: Cardiovascular;  Laterality: N/A;   ESOPHAGOGASTRODUODENOSCOPY (EGD) WITH PROPOFOL N/A 11/07/2019   Procedure: ESOPHAGOGASTRODUODENOSCOPY (EGD) WITH PROPOFOL;  Surgeon: Milus Banister, MD;  [facility];  Service: Endoscopy;  Laterality: N/A;   HERNIA REPAIR     KNEE  ARTHROSCOPY WITH MEDIAL MENISECTOMY Left 04/23/2021   Procedure: LEFT KNEE ARTHROSCOPY WITH PARTIAL MEDIAL MENISCECTOMY;  Surgeon: [name], MD;  Location: [place];  Service: Orthopedics;  Laterality: Left;   KNEE SURGERY  6-73yrs ago   left   LEFT HEART CATH AND CORONARY ANGIOGRAPHY N/A 09/19/2016   Procedure: Left Heart Cath and Coronary Angiography;  Surgeon: Belva Crome, MD;  Location: [place] CV LAB;  Service: Cardiovascular;  Laterality: N/A;   Left inguinal hernia repair     as a child   NASAL SEPTOPLASTY W/ TURBINOPLASTY Bilateral 02/19/2014   Procedure: NASAL SEPTOPLASTY WITH BILATERAL TURBINATE REDUCTION;  Surgeon: Jodi Marble, MD;  Location: [place];  Service: ENT;  Laterality: Bilateral;   NECK SURGERY  34yrs ago   right shoulder surgery  4-34yrs ago   cyst removed   TEE WITHOUT CARDIOVERSION  05/05/2011   Procedure: TRANSESOPHAGEAL ECHOCARDIOGRAM (TEE);  Surgeon: Lelon Perla, MD;  Location: [facility] END[facility]COPY;  Service: Cardiovascular;  Laterality: N/A;   TEE WITHOUT CARDIOVERSION N/A 04/21/2015   Procedure: TRANSESOPHAGEAL ECHOCARDIOGRAM (TEE);  Surgeon: Sueanne Margarita, MD;  Location: [facility] ENDOSCOPY;  Service: Cardiovascular;  Laterality: N/A;   THROAT SURGERY  4-50yrs ago   "thought " it was cancer but came back not   TONSILLECTOMY      Social History   Socioeconomic History   Marital status: Married    Spouse name: Not on file   Number of children: 2   Years of education: Not on file   Highest education level: Not on file  Occupational History   Occupation: Dealer   Tobacco Use   Smoking status: Every Day    Packs/day: 1.00    Years: 49.00    Pack years: 49.00    Types: Cigarettes   Smokeless tobacco: Never   Tobacco comments:    Smoking a pack per day as of 04/19/21 hfb  Vaping Use   Vaping Use: Never used  Substance and Sexual Activity   Alcohol use: Not Currently    Alcohol/week: 0.0 standard drinks    Comment: 12/05/17 pt stated he has drinked 1/2  drink in 3 years   Drug use: No   Sexual activity: Yes  Other Topics Concern   Not on file  Social History Narrative   Daily caffeine(Mountain Dew)       Lives in [place] with spouse.   Unemployed due to chronic back/ leg pain         Social Determinants of Health   Financial Resource Strain: Not on file  Food Insecurity: Not on file  Transportation Needs: Not on file  Physical Activity: Not on file  Stress: Not on file  Social Connections: Not on file  Intimate Partner Violence: Not on file    Current Outpatient Medications on File Prior to Visit  Medication Sig Dispense Refill   albuterol (PROAIR HFA) 108 (90 Base) MCG/ACT inhaler Inhale 1-2 puffs into  the lungs every 6 (six) hours as needed for wheezing or shortness of breath. 18 g 5   b complex vitamins tablet Take 1 tablet by mouth daily.      bisoprolol (ZEBETA) 10 MG tablet Take 10 mg by mouth daily.      Continuous Blood Gluc Receiver (DEXCOM G6 RECEIVER) DEVI      Continuous Blood Gluc Sensor (DEXCOM G6 SENSOR) MISC 1 Device by Does not apply route See admin instructions. Change every 10 days 9 each 3   Continuous Blood Gluc Transmit (DEXCOM G6 TRANSMITTER) MISC USE AS DIRECTED 1 each 3   diltiazem (CARDIZEM CD) 120 MG 24 hr capsule Take 120 mg by mouth daily.     gabapentin (NEURONTIN) 300 MG capsule Take 300 mg by mouth See admin instructions. Take 1 capsule (300 mg) by mouth in the morning & take 3 capsules (900 mg) by mouth at night     Insulin Pen Needle (B-D UF III MINI PEN NEEDLES) 31G X 5 MM MISC 1 Device by Other route 3 (three) times daily with meals. Use three times daily to inject insulin. 300 each 3   losartan (COZAAR) 50 MG tablet Take 1 tablet (50 mg total) by mouth daily. 90 tablet 3   metolazone (ZAROXOLYN) 2.5 MG tablet TAKE 1 TABLET BY MOUTH ON TUESDAY AND THURSDAY 30 MINUTES BEFORE TAKING TORSEMIDE OR AS DIRECTED 15 tablet 3   Multiple Vitamin (MULTIVITAMIN WITH MINERALS) TABS Take 1 tablet by  mouth daily.      Multiple Vitamins-Minerals (ZINC PO) Take 25 mg by mouth daily.     nitroGLYCERIN (NITROSTAT) 0.4 MG SL tablet Place 1 tablet (0.4 mg total) under the tongue every 5 (five) minutes x 3 doses as needed for chest pain. 25 tablet 3   omeprazole (PRILOSEC) 20 MG capsule Take 1 capsule (20 mg total) by mouth 2 (two) times daily. 60 capsule 11   oxyCODONE-acetaminophen (PERCOCET) 10-325 MG tablet Take 1.5 tablets by mouth in the morning, at noon, in the evening, and at bedtime.     potassium chloride SA (KLOR-CON) 20 MEQ tablet TAKE 1 TABLET BY MOUTH DAILY. TAKE AN EXTRA TABLET ON TUESDAY AND THURSDAY (Patient taking differently: 20 mEq See admin instructions. Take 1 tablet (20 meq) by mouth (scheduled) at night on Sundays, Mondays, Wednesdays, Fridays & Saturdays & take 2 tablets (40 meq) by mouth at night on Tuesdays & Thursdays) 40 tablet 6   pravastatin (PRAVACHOL) 80 MG tablet TAKE 1 TABLET BY MOUTH DAILY AT BEDTIME 30 tablet 10   tamsulosin (FLOMAX) 0.4 MG CAPS capsule Take 0.4 mg by mouth at bedtime.     torsemide (DEMADEX) 20 MG tablet TAKE 2 TABLETS BY MOUTH 2 TIMES DAILY 360 tablet 2   triamcinolone cream (KENALOG) 0.1 % Apply 1 application topically daily as needed. 453 g 2   umeclidinium-vilanterol (ANORO ELLIPTA) 62.5-25 MCG/INH AEPB Inhale 1 puff into the lungs daily. (Patient taking differently: Inhale 1 puff into the lungs daily as needed (respiratory issues.).) 60 each 5   VITAMIN A PO Take 2,400 mcg by mouth daily.      Vitamin D, Ergocalciferol, (DRISDOL) 1.25 MG (50000 UT) CAPS capsule Take 50,000 Units by mouth every Friday.     warfarin (COUMADIN) 5 MG tablet TAKE 1 TO 1 AND 1/2 TABLETS BY MOUTH DAILY AS DIRECTED BY THE COUMADIN CLINIC (Patient taking differently: Take 2.5-5 mg by mouth See admin instructions. Take 0.5 tablet (2.5 mg) by mouth in the evening on  Mondays, Wednesdays & Fridays. Take 1 tablet (5 mg) by mouth in the evening on Sundays, Tuesdays, Thursdays &  Saturdays.) 45 tablet 1   No current facility-administered medications on file prior to visit.    Allergies  Allergen Reactions   Adhesive [Tape]     itching   Latex Itching    When tape is on the skin too long skin gets red & itching    Family History  Problem Relation Age of Onset   Diabetes Mother    Heart disease Father    Lung cancer Father    Diabetes Maternal Grandmother    Colon cancer Neg Hx    Anesthesia problems Neg Hx    Hypotension Neg Hx    Malignant hyperthermia Neg Hx    Pseudochol deficiency Neg Hx    Esophageal cancer Neg Hx    Pancreatic cancer Neg Hx    Stomach cancer Neg Hx     BP 120/60    Pulse 70    Ht 6\' 1"  (1.854 m)    Wt 249 lb 6.4 oz (113.1 kg)    SpO2 96%    BMI 32.90 kg/m    Review of Systems Denies N/V/HB.      Objective:   Physical Exam   A1c=8.1%    Assessment & Plan:  Insulin-requiring type 2 DM: uncontrolled   Patient Instructions  I have sent a prescription to your pharmacy, to try increasing the Trulicity again, and:  please reduce the insulin to 3 times a day (just before each meal) 50-40-60 units.  check your blood sugar twice a day.  vary the time of day when you check, between before the 3 meals, and at bedtime.  also check if you have symptoms of your blood sugar being too high or too low.  please keep a record of the readings and bring it to your next appointment here (or you can bring the meter itself).  You can write it on any piece of paper.  please call us sooner if your blood sugar goes below 70, or if you have a lot of readings over 200.   Please come back for a follow-up appointment in 2 months.

## 2021-05-26 NOTE — Patient Instructions (Addendum)
I have sent a prescription to your pharmacy, to try increasing the Trulicity again, and:  please reduce the insulin to 3 times a day (just before each meal) 50-40-60 units.  check your blood sugar twice a day.  vary the time of day when you check, between before the 3 meals, and at bedtime.  also check if you have symptoms of your blood sugar being too high or too low.  please keep a record of the readings and bring it to your next appointment here (or you can bring the meter itself).  You can write it on any piece of paper.  please call us sooner if your blood sugar goes below 70, or if you have a lot of readings over 200.   Please come back for a follow-up appointment in 2 months.

## 2021-06-03 ENCOUNTER — Other Ambulatory Visit: Payer: Self-pay | Admitting: Cardiology

## 2021-06-08 ENCOUNTER — Ambulatory Visit (INDEPENDENT_AMBULATORY_CARE_PROVIDER_SITE_OTHER): Payer: Managed Care, Other (non HMO) | Admitting: Orthopaedic Surgery

## 2021-06-08 ENCOUNTER — Other Ambulatory Visit: Payer: Self-pay

## 2021-06-08 ENCOUNTER — Encounter: Payer: Self-pay | Admitting: Orthopaedic Surgery

## 2021-06-08 DIAGNOSIS — M1712 Unilateral primary osteoarthritis, left knee: Secondary | ICD-10-CM

## 2021-06-10 NOTE — Progress Notes (Signed)
Post-Op Visit Note   Patient: [name]           Date of Birth: 08-03-1955           MRN: [id number] Visit Date: 06/08/2021 PCP: Pcp, No   Assessment & Plan: Post left knee arthroscopy partial medial meniscectomy.  He did have significant degenerative changes but due to all of his medical problems cardiac and pulmonary he is not a candidate for total knee arthroplasty.  He states his knee still bothers him.  Some pain primarily in the calf.  Some pain medially he is ambulatory with a cane.  He still taking Percocet 10 which she was taking chronically.  Chief Complaint:  Chief Complaint  Patient presents with   Left Knee - Follow-up    04/23/2021 left knee arthroscopy, PMM   Visit Diagnoses:  1. Unilateral primary osteoarthritis, left knee     Plan: Patient had knee debridement partial meniscectomy.  He can continue with topical cream intermittent ice continue use of the cane fall prevention discussed.  If he has a significant synovitis he could follow-up for an injection.  We reviewed intraoperative findings and arthroscopic photos.  Follow-Up Instructions: Return if symptoms worsen or fail to improve.   Orders:  No orders of the defined types were placed in this encounter.  No orders of the defined types were placed in this encounter.   Imaging: No results found.  PMFS History: Patient Active Problem List   Diagnosis Date Noted   Unilateral primary osteoarthritis, left knee 06/08/2021   Acute medial meniscal tear, left, initial encounter    Preop pulmonary/respiratory exam 04/19/2021   Chronic meniscal tear of knee 03/15/2021   Dysphagia 04/17/2020   LVH (left ventricular hypertrophy) 11/07/2019   Heme positive stool 10/03/2019   Anemia 10/03/2019   Insulin dependent type 2 diabetes mellitus ([place]) 08/26/2019   Acute on chronic diastolic heart failure ([place]) 08/26/2019   Rash and nonspecific skin eruption 08/26/2019   Foot pain 08/22/2019   Right heart failure  ([place]) 08/07/2019   Leg swelling 07/28/2019   Educated about COVID-19 virus infection 07/28/2019   Diabetes ([place]) 06/12/2019   Urticaria 05/15/2019   Dermatitis due to drug 04/18/2019   Edema 03/29/2019   Erectile dysfunction 03/29/2019   High risk medication use 03/29/2019   History of iron deficiency 03/29/2019   Pulmonary hypertension ([place]) 03/29/2019   Venous stasis dermatitis of both lower extremities 03/29/2019   Mixed hyperlipidemia 02/14/2019   Onychomycosis due to dermatophyte 11/06/2018   Hammer toe of right foot 07/17/2018   Tinea pedis of both feet 07/17/2018   History of radiofrequency ablation (RFA) procedure for cardiac arrhythmia 06/21/2018   Lumbar back pain with radiculopathy affecting lower extremity 06/21/2018   Hepatic cirrhosis ([place]) 12/26/2017   Left lower quadrant pain 12/05/2017   Change in bowel habits 12/05/2017   Palpitations 10/03/2017   SOB (shortness of breath) 04/12/2017   Chest pain with moderate risk of acute coronary syndrome 08/28/2016   Unstable angina ([place]) 08/26/2016   Longstanding persistent atrial fibrillation ([place]) 04/21/2015   Deviated nasal septum 02/19/2014   History of colon polyps 11/22/2012   Syncope 10/17/2012   Acute asthmatic bronchitis 11/22/2011   Benign neoplasm of colon 10/13/2011   Unspecified gastritis and gastroduodenitis without mention of hemorrhage 10/13/2011   GERD (gastroesophageal reflux disease) 10/13/2011   Obesity (BMI 30-39.9) 09/30/2011   Dyslipidemia 06/23/2011   Neuropathy 06/23/2011   Fatigue 05/13/2011   Malaise and fatigue 05/13/2011  Rectal bleeding 04/18/2011   Atrial flutter ([place]) 04/18/2011   Chronic anticoagulation 11/10/2010   PAF (paroxysmal atrial fibrillation) ([facility])    Tobacco abuse    COPD (chronic obstructive pulmonary disease) ([place]) 02/11/2010   Essential hypertension 01/28/2010   OSA (obstructive sleep apnea) 01/28/2010   Past Medical History:  Diagnosis Date   Arthritis    lower  back   Asthma    Atrial fibrillation [facility])    Atrial flutter ([place])    s/p CTI ablation by Dr Rayann Heman   Brain aneurysm 2009   questionable. A follow up CTA in 2009 showed no evidence of   Chronic back pain    DDD/stenosis   Colon polyps    9 polyps removed 6/64/40   Complication of anesthesia 09/11/2012   slow to awaken after ablation   COPD (chronic obstructive pulmonary disease) ([place])    Diabetes mellitus    takes Metformin and Glimepiride daily   Emphysema    GERD (gastroesophageal reflux disease)    takes Omeprazole bid   Heart failure (H[facility])    History of shingles    HLD (hyperlipidemia)    takes Pravastatin daily   HTN (hypertension)    takes Prinizide daily   Obesity    OSA (obstructive sleep apnea)    not always using cpap   Overdose 2009   unintentional Flecanide overdose   Peripheral neuropathy    Short-term memory loss    Tobacco abuse     Family History  Problem Relation Age of Onset   Diabetes Mother    Heart disease Father    Lung cancer Father    Diabetes Maternal Grandmother    Colon cancer Neg Hx    Anesthesia problems Neg Hx    Hypotension Neg Hx    Malignant hyperthermia Neg Hx    Pseudochol deficiency Neg Hx    Esophageal cancer Neg Hx    Pancreatic cancer Neg Hx    Stomach cancer Neg Hx     Past Surgical History:  Procedure Laterality Date   ANTERIOR CERVICAL DECOMP/DISCECTOMY FUSION  01/04/2012   Procedure: ANTERIOR CERVICAL DECOMPRESSION/DISCECTOMY FUSION 1 LEVEL/HARDWARE REMOVAL;  Surgeon: Ophelia Charter, MD;  Location: [place] NEURO ORS;  Service: Neurosurgery;  Laterality: N/A;  explore cervical fusion Cervical six - seven  with removal of codman plate anterior cervical decompression with fusion interbody prothesis plating and bonegraft   APPENDECTOMY  2-59yrs ago   [place] N/A 09/11/2012   PT DID NOT HAVE AN ATRIAL FIBRILLATION ABLATION IN 2014!  ATRIAL FLUTTER ABLATION ONLY   ATRIAL FLUTTER ABLATION  09/11/2012   CTI  ablation by Dr Rayann Heman   BIOPSY  11/07/2019   Procedure: BIOPSY;  Surgeon: Milus Banister, MD;  Location: [facility] ENDOSCOPY;  Service: Endoscopy;;   CARDIAC CATHETERIZATION  2008   no significant CAD   CARDIOVERSION  05/05/2011   Procedure: CARDIOVERSION;  Surgeon: Lelon Perla, MD;  Location: [place];  Service: Cardiovascular;  Laterality: N/A;   CARDIOVERSION Bilateral 07/26/2012   Procedure: CARDIOVERSION;  Surgeon: Minus Breeding, MD;  Location: [facility] ENDOSCOPY;  Service: Cardiovascular;  Laterality: Bilateral;   CARPAL TUNNEL RELEASE  99/2000   bilateral   COLONOSCOPY WITH PROPOFOL N/A 12/13/2012   Procedure: COLONOSCOPY WITH PROPOFOL;  Surgeon: Milus Banister, MD;  Location: [facility] ENDOSCOPY;  Service: Endoscopy;  Laterality: N/A;   ELECTROPHYSIOLOGIC STUDY N/A 04/21/2015   Procedure: Atrial Fibrillation Ablation;  Surgeon: Thompson Grayer, MD;  Location: [place] CV LAB;  Service:  Cardiovascular;  Laterality: N/A;   ESOPHAGOGASTRODUODENOSCOPY (EGD) WITH PROPOFOL N/A 11/07/2019   Procedure: ESOPHAGOGASTRODUODENOSCOPY (EGD) WITH PROPOFOL;  Surgeon: Milus Banister, MD;  Location: [facility] ENDOSCOPY;  Service: Endoscopy;  Laterality: N/A;   HERNIA REPAIR     KNEE ARTHROSCOPY WITH MEDIAL MENISECTOMY Left 04/23/2021   Procedure: LEFT KNEE ARTHROSCOPY WITH PARTIAL MEDIAL MENISCECTOMY;  Surgeon: [name], MD;  Location: [place];  Service: Orthopedics;  Laterality: Left;   KNEE SURGERY  6-32yrs ago   left   LEFT HEART CATH AND CORONARY ANGIOGRAPHY N/A 09/19/2016   Procedure: Left Heart Cath and Coronary Angiography;  Surgeon: Belva Crome, MD;  Location: [place] CV LAB;  Service: Cardiovascular;  Laterality: N/A;   Left inguinal hernia repair     as a child   NASAL SEPTOPLASTY W/ TURBINOPLASTY Bilateral 02/19/2014   Procedure: NASAL SEPTOPLASTY WITH BILATERAL TURBINATE REDUCTION;  Surgeon: Jodi Marble, MD;  Location: [place];  Service: ENT;  Laterality: Bilateral;   NECK SURGERY  10yrs ago    right shoulder surgery  4-73yrs ago   cyst removed   TEE WITHOUT CARDIOVERSION  05/05/2011   Procedure: TRANSESOPHAGEAL ECHOCARDIOGRAM (TEE);  Surgeon: Lelon Perla, MD;  Location[facility] ENDOSCOPY;  Service: Cardiovascular;  Laterality: N/A;   TEE WITHOUT CARDIOVERSION N/A 04/21/2015   Procedure: TRANSESOPHAGEAL ECHOCARDIOGRAM (TEE);  Surgeon: Sueanne Margarita, MD;  Location[facility] ENDOSCOPY;  Service: Cardiovascular;  Laterality: N/A;   THROAT SURGERY  4-77yrs ago   "thought " it was cancer but came back not   TONSILLECTOMY     Social History   Occupational History   Occupation: Dealer   Tobacco Use   Smoking status: Every Day    Packs/day: 1.00    Years: 49.00    Pack years: 49.00    Types: Cigarettes   Smokeless tobacco: Never   Tobacco comments:    Smoking a pack per day as of 04/19/21 hfb  Vaping Use   Vaping Use: Never used  Substance and Sexual Activity   Alcohol use: Not Currently    Alcohol/week: 0.0 standard drinks    Comment: 12/05/17 pt stated he has drinked 1/2 drink in 3 years   Drug use: No   Sexual activity: Yes

## 2021-06-11 ENCOUNTER — Telehealth: Payer: Self-pay

## 2021-06-11 ENCOUNTER — Other Ambulatory Visit (HOSPITAL_COMMUNITY): Payer: Self-pay

## 2021-06-11 ENCOUNTER — Ambulatory Visit: Payer: 59 | Admitting: Orthopaedic Surgery

## 2021-06-11 NOTE — Telephone Encounter (Signed)
Patient Advocate Encounter   Received notification from [facility] that prior authorization for Dexcom G6 Sensors is required by his/her insurance [employer].   PA submitted on 06/11/21  Key#: [id number]  Status is pending    [facility] will continue to follow:  Patient Advocate Fax: [phone]

## 2021-06-17 ENCOUNTER — Other Ambulatory Visit (HOSPITAL_COMMUNITY): Payer: Self-pay

## 2021-06-17 NOTE — Telephone Encounter (Signed)
Patient Advocate Encounter  Prior Authorization for [employer] has been approved.    PA# [id number]  Effective dates: 06/11/21 through 06/16/22  Per Test Claim Patients co-pay is $474.58 = 90 DS or  $347.23 = 30 DS  Spoke with Pharmacy to Process.  Patient Advocate Fax: [phone]

## 2021-06-23 ENCOUNTER — Telehealth: Payer: Self-pay

## 2021-06-23 NOTE — Telephone Encounter (Deleted)
I notified Anette Guarneri, [place], that pt will not be in for appt tomorrow. [facility] has [facility].

## 2021-06-23 NOTE — Telephone Encounter (Signed)
Wrong chart

## 2021-06-24 ENCOUNTER — Telehealth: Payer: Self-pay | Admitting: Cardiology

## 2021-06-24 NOTE — Telephone Encounter (Signed)
Pt c/o Shortness Of Breath: STAT if SOB developed within the last 24 hours or pt is noticeably SOB on the phone  1. Are you currently SOB (can you hear that pt is SOB on the phone)? No  2. How long have you been experiencing SOB? About a month  3. Are you SOB when sitting or when up moving around? both  4. Are you currently experiencing any other symptoms? No   Patient states he has been having SOB for about a month. He states it was pretty bad earlier, but now he is fine.

## 2021-06-24 NOTE — Telephone Encounter (Signed)
Spoke with patient - schedule for 06/28/21 with Isaac Laud PA  Advised if he has worsening symptoms or passes out, he should call EMS/go to ED

## 2021-06-24 NOTE — Telephone Encounter (Signed)
He can be scheduled next available with APP.      ----- Message -----  From: Loren Racer, RN  Sent: 06/24/2021  11:48 AM EST  To: Minus Breeding, MD, Cv Div Nl Triage

## 2021-06-24 NOTE — Telephone Encounter (Signed)
Pt with intermittent SOB x 1 month.  Had a rough episode this morning where he thought he might black out but he didn't.  Walked from the porch into the house and became lightheaded and dizzy.  Sat down and it passed and now he feels fine.  SOB is occurring daily with activity.  BP right after episode was 106/55, HR 70s.  Denies CP, HA, or vision issues with episodes.  States he occasionally will feel flush.  Normally knows when he's in AF and states it doesn't feel like that.  Pt does not want to go to ER for eval.  Currently has echo scheduled 07/09/21.  Advised pt I will send message to Dr. Percival Spanish for review and recommendations.  Pt appreciative for call.

## 2021-06-28 ENCOUNTER — Other Ambulatory Visit: Payer: Self-pay

## 2021-06-28 ENCOUNTER — Ambulatory Visit (INDEPENDENT_AMBULATORY_CARE_PROVIDER_SITE_OTHER): Payer: Managed Care, Other (non HMO) | Admitting: Physician Assistant

## 2021-06-28 ENCOUNTER — Ambulatory Visit (INDEPENDENT_AMBULATORY_CARE_PROVIDER_SITE_OTHER): Payer: Managed Care, Other (non HMO)

## 2021-06-28 ENCOUNTER — Encounter: Payer: Self-pay | Admitting: Physician Assistant

## 2021-06-28 VITALS — BP 124/62 | HR 78 | Ht 74.0 in | Wt 241.8 lb

## 2021-06-28 DIAGNOSIS — I10 Essential (primary) hypertension: Secondary | ICD-10-CM

## 2021-06-28 DIAGNOSIS — R55 Syncope and collapse: Secondary | ICD-10-CM | POA: Diagnosis not present

## 2021-06-28 DIAGNOSIS — I480 Paroxysmal atrial fibrillation: Secondary | ICD-10-CM

## 2021-06-28 DIAGNOSIS — Z7901 Long term (current) use of anticoagulants: Secondary | ICD-10-CM | POA: Diagnosis not present

## 2021-06-28 DIAGNOSIS — M545 Low back pain, unspecified: Secondary | ICD-10-CM | POA: Diagnosis not present

## 2021-06-28 DIAGNOSIS — R0602 Shortness of breath: Secondary | ICD-10-CM

## 2021-06-28 DIAGNOSIS — Z5181 Encounter for therapeutic drug level monitoring: Secondary | ICD-10-CM

## 2021-06-28 DIAGNOSIS — J449 Chronic obstructive pulmonary disease, unspecified: Secondary | ICD-10-CM | POA: Diagnosis not present

## 2021-06-28 DIAGNOSIS — E785 Hyperlipidemia, unspecified: Secondary | ICD-10-CM

## 2021-06-28 DIAGNOSIS — G8929 Other chronic pain: Secondary | ICD-10-CM

## 2021-06-28 DIAGNOSIS — E119 Type 2 diabetes mellitus without complications: Secondary | ICD-10-CM

## 2021-06-28 LAB — COMPREHENSIVE METABOLIC PANEL
ALT: 13 IU/L (ref 0–44)
AST: 21 IU/L (ref 0–40)
Albumin/Globulin Ratio: 1.3 (ref 1.2–2.2)
Albumin: 4.1 g/dL (ref 3.8–4.8)
Alkaline Phosphatase: 145 IU/L — ABNORMAL HIGH (ref 44–121)
BUN/Creatinine Ratio: 17 (ref 10–24)
BUN: 22 mg/dL (ref 8–27)
Bilirubin Total: 0.5 mg/dL (ref 0.0–1.2)
CO2: 31 mmol/L — ABNORMAL HIGH (ref 20–29)
Calcium: 9.9 mg/dL (ref 8.6–10.2)
Chloride: 98 mmol/L (ref 96–106)
Creatinine, Ser: 1.32 mg/dL — ABNORMAL HIGH (ref 0.76–1.27)
Globulin, Total: 3.2 g/dL (ref 1.5–4.5)
Glucose: 160 mg/dL — ABNORMAL HIGH (ref 70–99)
Potassium: 3.2 mmol/L — ABNORMAL LOW (ref 3.5–5.2)
Sodium: 143 mmol/L (ref 134–144)
Total Protein: 7.3 g/dL (ref 6.0–8.5)
eGFR: 61 mL/min/{1.73_m2} (ref >59)

## 2021-06-28 LAB — TSH: TSH: 2.7 u[IU]/mL (ref 0.450–4.500)

## 2021-06-28 LAB — CBC
Hematocrit: 36.1 % — ABNORMAL LOW (ref 37.5–51.0)
Hemoglobin: 11.8 g/dL — ABNORMAL LOW (ref 13.0–17.7)
MCH: 28.4 pg (ref 26.6–33.0)
MCHC: 32.7 g/dL (ref 31.5–35.7)
MCV: 87 fL (ref 79–97)
Platelets: 162 10*3/uL (ref 150–450)
RBC: 4.16 x10E6/uL (ref 4.14–5.80)
RDW: 16.1 % — ABNORMAL HIGH (ref 11.6–15.4)
WBC: 6.4 10*3/uL (ref 3.4–10.8)

## 2021-06-28 LAB — POCT INR: INR: 3.4 — AB (ref 2.0–3.0)

## 2021-06-28 MED ORDER — NITROGLYCERIN 0.4 MG SL SUBL: 0.4000 mg | SUBLINGUAL_TABLET | SUBLINGUAL | 3 refills | Status: DC | PRN

## 2021-06-28 NOTE — Progress Notes (Unsigned)
Enrolled for [employer] to mail a ZIO AT Live Telemetry monitor to patients address on file.  

## 2021-06-28 NOTE — Progress Notes (Signed)
Cardiology Office Note:    Date:  06/30/2021   ID:  [name], DOB [birth year], MRN [id number]  PCP:  Kathyrn Lass   [facility] Providers Cardiologist:  Minus Breeding, MD     Referring MD: No ref. provider found   Chief Complaint  Patient presents with   Shortness of Breath    Seen for Dr. Percival Spanish    History of Present Illness:    [name] is a 66 y.o. male with a hx of COPD/emphysema, HTN, HLD, DM II, obesity, OSA intolerant of CPAP, history of tobacco abuse and history of atrial fibrillation and atrial flutter. Patient had atrial flutter ablation in 2014. Since he has both paroxysmal atrial fibrillation and atrial flutter, he underwent EP study with a second ablation on 04/21/2015. Cardiac catheterization in May 2018 showed widely patent coronary arteries with normal EF, moderate LVH. Echocardiogram obtained on 08/14/2019 showed EF 60 to 65%, moderate LVH, grade 2 DD, moderately elevated pulmonary artery systolic pressure, RVSP 17.6 mmHg, severe left atrial enlargement, moderate right atrial enlargement, mild MR, mild to moderate TR. Carotid Doppler obtained on 09/30/2019 showed 1 to 39% disease in bilateral internal carotid artery. Cardiac MRI performed on 12/16/2019 showed EF 66%, moderate concentric LVH, no myocardial LGE to suggest prior MI, infiltrative cardiac disease or myocarditis. No evidence of cardiac amyloidosis.  He unfortunately had a quite significant leg pain after cardiac MRI.  I saw the patient in September 2021, I felt his pain is most consistent with myositis versus compressed nerve in the lower back.  Total CK was negative which argues against myositis.  Lower extremity arterial Doppler obtained on 02/12/2020 showed normal ABI and a triphasic blood flow throughout both legs, no vascular blockage was seen to explain his leg discomfort.    He was last seen by Dr. Percival Spanish in August 2022 at which time he continue to have back pain and occasional palpitation.  I last  saw the patient on 03/10/2021, he again complained of significant leg discomfort.  His lower extremity was tender to touch.  Venous Doppler obtained on 04/29/2021 was negative for DVT.  More recently, he called the cardiology office a few days ago complaining of intermittent shortness of breath for 1 month.  Patient presents today for evaluation of episodes of dizziness and shortness of breath.  He noticed the first episode last Thursday while sitting down eating breakfast.  He had a sudden onset of dizziness, presyncope and a feeling of shortness of breath.  Symptom eventually resolved by itself.  He always had shortness of breath with exertion, this has not changed recently.  This morning, while eating out with his friend, he had a similar experience.  He has an upcoming echocardiogram scheduled on 07/09/2021.  I recommended initial blood work with CBC, CMP and TSH.  Recent hemoglobin A1c was mildly elevated at 8.1.  I am concerned about the sudden onset of shortness of breath and presyncope this morning, I recommended initial blood work using CBC, CMP and a TSH.  We will also obtain a 2-week heart monitor with live monitoring as well.  He can follow-up in 5 to 6 weeks from now.  As for his back pain, I recommend he see Dr. Louanne Skye whom he has seen in the past for back issue.  He says he did have epidural injection in the past however it did not help.  Past Medical History:  Diagnosis Date   Arthritis    lower back   Asthma  Atrial fibrillation [facility])    Atrial flutter [facility])    s/p CTI ablation by Dr Rayann Heman   Brain aneurysm 2009   questionable. A follow up CTA in 2009 showed no evidence of   Chronic back pain    DDD/stenosis   Colon polyps    9 polyps removed 9/32/35   Complication of anesthesia 09/11/2012   slow to awaken after ablation   COPD (chronic obstructive pulmonary disease) ([place])    Diabetes mellitus    takes Metformin and Glimepiride daily   Emphysema    GERD (gastroesophageal  reflux disease)    takes Omeprazole bid   Heart failure ([place])    History of shingles    HLD (hyperlipidemia)    takes Pravastatin daily   HTN (hypertension)    takes Prinizide daily   Obesity    OSA (obstructive sleep apnea)    not always using cpap   Overdose 2009   unintentional Flecanide overdose   Peripheral neuropathy    Short-term memory loss    Tobacco abuse     Past Surgical History:  Procedure Laterality Date   ANTERIOR CERVICAL DECOMP/DISCECTOMY FUSION  01/04/2012   Procedure: ANTERIOR CERVICAL DECOMPRESSION/DISCECTOMY FUSION 1 LEVEL/HARDWARE REMOVAL;  Surgeon: Ophelia Charter, MD;  Location: [place] NEURO ORS;  Service: Neurosurgery;  Laterality: N/A;  explore cervical fusion Cervical six - seven  with removal of codman plate anterior cervical decompression with fusion interbody prothesis plating and bonegraft   APPENDECTOMY  2-60yr ago   [place] 09[place]   PT DID NOT HAVE AN ATRIAL FIBRILLATION ABLATION IN 2014!  ATRIAL FLUTTER ABLATION ONLY   ATRIAL FLUTTER ABLATION  09/11/2012   CTI ablation by Dr ARayann Heman  BIOPSY  11/07/2019   Procedure: BIOPSY;  Surgeon: JMilus Banister MD;  [facility];  Service: Endoscopy;;   CARDIAC CATHETERIZATION  2008   no significant CAD   CARDIOVERSION  05/05/2011   Procedure: CARDIOVERSION;  Surgeon: BLelon Perla MD;  [place]  Service: Cardiovascular;  Laterality: N/A;   CARDIOVERSION Bilateral 07/26/2012   Procedure: CARDIOVERSION;  Surgeon: JMinus Breeding MD;  [facility];  Service: Cardiovascular;  Laterality: Bilateral;   CARPAL TUNNEL RELEASE  99/2000   bilateral   COLONOSCOPY WITH PROPOFOL N/A 12/13/2012   Procedure: COLONOSCOPY WITH PROPOFOL;  Surgeon: DMilus Banister MD;  [facility];  Service: Endoscopy;  Laterality: N/A;   ELECTROPHYSIOLOGIC STUDY N/A 04/21/2015   Procedure: Atrial Fibrillation Ablation;  Surgeon: JThompson Grayer MD;  [place];  Service: Cardiovascular;  Laterality: N/A;   ESOPHAGOGASTRODUODENOSCOPY (EGD) WITH PROPOFOL N/A 11/07/2019   Procedure: ESOPHAGOGASTRODUODENOSCOPY (EGD) WITH PROPOFOL;  Surgeon: JMilus Banister MD;  [facility];  Service: Endoscopy;  Laterality: N/A;   HERNIA REPAIR     KNEE ARTHROSCOPY WITH MEDIAL MENISECTOMY Left 04/23/2021   Procedure: LEFT KNEE ARTHROSCOPY WITH PARTIAL MEDIAL MENISCECTOMY;  Surgeon: YMarybelle Killings MD;  [place]  Service: Orthopedics;  Laterality: Left;   KNEE SURGERY  6-735yrago   left   LEFT HEART CATH AND CORONARY ANGIOGRAPHY N/A 09/19/2016   Procedure: Left Heart Cath and Coronary Angiography;  Surgeon: SmBelva CromeMD;  [place];  Service: Cardiovascular;  Laterality: N/A;   Left inguinal hernia repair     as a child   NASAL SEPTOPLASTY W/ TURBINOPLASTY Bilateral 02/19/2014   Procedure: NASAL SEPTOPLASTY WITH BILATERAL TURBINATE REDUCTION;  Surgeon: KaJodi MarbleMD;  [facility];  Service: ENT;  Laterality: Bilateral;   NECK SURGERY  57yr ago   right shoulder surgery  4-541yrago   cyst removed   TEE WITHOUT CARDIOVERSION  05/05/2011   Procedure: TRANSESOPHAGEAL ECHOCARDIOGRAM (TEE);  Surgeon: BrLelon PerlaMD;  [facility];  Service: Cardiovascular;  Laterality: N/A;   TEE WITHOUT CARDIOVERSION N/A 04/21/2015   Procedure: TRANSESOPHAGEAL ECHOCARDIOGRAM (TEE);  Surgeon: TrSueanne MargaritaMD;  [facility];  Service: Cardiovascular;  Laterality: N/A;   THROAT SURGERY  4-5y24yrgo   "thought " it was cancer but came back not   TONSILLECTOMY      Current Medications: Current Meds  Medication Sig   albuterol (PROAIR HFA) 108 (90 Base) MCG/ACT inhaler Inhale 1-2 puffs into the lungs every 6 (six) hours as needed for wheezing or shortness of breath.   b complex vitamins tablet Take 1 tablet by mouth daily.    bisoprolol (ZEBETA) 10 MG tablet Take 10 mg by mouth daily.    Continuous  Blood Gluc Receiver (DEXCOM G6 RECEIVER) DEVI    Continuous Blood Gluc Sensor (DEXCOM G6 SENSOR) MISC 1 Device by Does not apply route See admin instructions. Change every 10 days   Continuous Blood Gluc Transmit (DEXCOM G6 TRANSMITTER) MISC USE AS DIRECTED   diltiazem (CARDIZEM CD) 120 MG 24 hr capsule Take 120 mg by mouth daily.   Dulaglutide (TRULICITY) 3 MG/MO/7.0BEPN Inject 3 mg as directed once a week.   gabapentin (NEURONTIN) 300 MG capsule Take 300 mg by mouth See admin instructions. Take 1 capsule (300 mg) by mouth in the morning & take 3 capsules (900 mg) by mouth at night   insulin aspart (FIASP FLEXTOUCH) 100 UNIT/ML FlexTouch Pen 3 times a day (just before each meal) 50-40-60 units.   Insulin Pen Needle (B-D UF III MINI PEN NEEDLES) 31G X 5 MM MISC 1 Device by Other route 3 (three) times daily with meals. Use three times daily to inject insulin.   losartan (COZAAR) 50 MG tablet Take 1 tablet (50 mg total) by mouth daily.   metolazone (ZAROXOLYN) 2.5 MG tablet TAKE 1 TABLET BY MOUTH ON TUESDAY AND THURSDAY 30 MINUTES BEFORE TAKING TORSEMIDE OR AS DIRECTED   Multiple Vitamin (MULTIVITAMIN WITH MINERALS) TABS Take 1 tablet by mouth daily.    Multiple Vitamins-Minerals (ZINC PO) Take 25 mg by mouth daily.   omeprazole (PRILOSEC) 20 MG capsule Take 1 capsule (20 mg total) by mouth 2 (two) times daily.   oxyCODONE-acetaminophen (PERCOCET) 10-325 MG tablet Take 1.5 tablets by mouth in the morning, at noon, in the evening, and at bedtime.   potassium chloride SA (KLOR-CON) 20 MEQ tablet TAKE 1 TABLET BY MOUTH DAILY. TAKE AN EXTRA TABLET ON TUESDAY AND THURSDAY (Patient taking differently: 20 mEq See admin instructions. Take 1 tablet (20 meq) by mouth (scheduled) at night on Sundays, Mondays, Wednesdays, Fridays & Saturdays & take 2 tablets (40 meq) by mouth at night on Tuesdays & Thursdays)   pravastatin (PRAVACHOL) 80 MG tablet TAKE 1 TABLET BY MOUTH DAILY AT BEDTIME   tamsulosin (FLOMAX) 0.4  MG CAPS capsule Take 0.4 mg by mouth at bedtime.   torsemide (DEMADEX) 20 MG tablet TAKE 2 TABLETS BY MOUTH 2 TIMES DAILY   triamcinolone cream (KENALOG) 0.1 % Apply 1 application topically daily as needed.   umeclidinium-vilanterol (ANORO ELLIPTA) 62.5-25 MCG/INH AEPB Inhale 1 puff into the lungs daily. (Patient taking differently: Inhale 1 puff into the lungs daily as  needed (respiratory issues.).)   VITAMIN A PO Take 2,400 mcg by mouth daily.    Vitamin D, Ergocalciferol, (DRISDOL) 1.25 MG (50000 UT) CAPS capsule Take 50,000 Units by mouth every Friday.   warfarin (COUMADIN) 5 MG tablet TAKE 1 TO 1 AND 1/2 TABLETS BY MOUTH DAILY AS DIRECTED BY THE COUMADIN CLINIC   [DISCONTINUED] nitroGLYCERIN (NITROSTAT) 0.4 MG SL tablet Place 1 tablet (0.4 mg total) under the tongue every 5 (five) minutes x 3 doses as needed for chest pain.     Allergies:   Adhesive [tape] and Latex   Social History   Socioeconomic History   Marital status: Married    Spouse name: Not on file   Number of children: 2   Years of education: Not on file   Highest education level: Not on file  Occupational History   Occupation: Dealer   Tobacco Use   Smoking status: Every Day    Packs/day: 1.00    Years: 49.00    Pack years: 49.00    Types: Cigarettes   Smokeless tobacco: Never   Tobacco comments:    Smoking a pack per day as of 04/19/21 hfb  Vaping Use   Vaping Use: Never used  Substance and Sexual Activity   Alcohol use: Not Currently    Alcohol/week: 0.0 standard drinks    Comment: 12/05/17 pt stated he has drinked 1/2 drink in 3 years   Drug use: No   Sexual activity: Yes  Other Topics Concern   Not on file  Social History Narrative   Daily caffeine(Mountain Dew)       Lives in [place] with spouse.   Unemployed due to chronic back/ leg pain         Social Determinants of Health   Financial Resource Strain: Not on file  Food Insecurity: Not on file  Transportation Needs: Not on file   Physical Activity: Not on file  Stress: Not on file  Social Connections: Not on file     Family History: The patient's family history includes Diabetes in his maternal grandmother and mother; Heart disease in his father; Lung cancer in his father. There is no history of Colon cancer, Anesthesia problems, Hypotension, Malignant hyperthermia, Pseudochol deficiency, Esophageal cancer, Pancreatic cancer, or Stomach cancer.  ROS:   Please see the history of present illness.     All other systems reviewed and are negative.  EKGs/Labs/Other Studies Reviewed:    The following studies were reviewed today:  Echo 08/14/2019 1. Left ventricular ejection fraction, by estimation, is 60 to 65%. The  left ventricle has normal function. The left ventricle has no regional  wall motion abnormalities. There is moderate concentric left ventricular  hypertrophy. Left ventricular  diastolic parameters are consistent with Grade II diastolic dysfunction  (pseudonormalization). Elevated left atrial pressure.   2. Right ventricular systolic function is normal. The right ventricular  size is normal. There is moderately elevated pulmonary artery systolic  pressure. The estimated right ventricular systolic pressure is 78.9 mmHg.   3. Left atrial size was severely dilated.   4. Right atrial size was moderately dilated.   5. The mitral valve is normal in structure. Mild mitral valve  regurgitation. No evidence of mitral stenosis.   6. Tricuspid valve regurgitation is mild to moderate.   7. The aortic valve is tricuspid. Aortic valve regurgitation is not  visualized. Mild to moderate aortic valve sclerosis/calcification is  present, without any evidence of aortic stenosis.   8. The inferior vena  cava is dilated in size with >50% respiratory  variability, suggesting right atrial pressure of 8 mmHg.   Comparison(s): No significant change from prior study. Prior images  reviewed side by side.   EKG:  EKG is  ordered today.  The ekg ordered today demonstrates normal sinus rhythm, no significant ST-T wave changes  Recent Labs: 01/06/2021: BNP 90.8 06/28/2021: ALT 13; BUN 22; Creatinine, Ser 1.32; Hemoglobin 11.8; Platelets 162; Potassium 3.2; Sodium 143; TSH 2.700  Recent Lipid Panel    Component Value Date/Time   CHOL 122 08/18/2017 0910   TRIG 400 (H) 08/18/2017 0910   HDL 29 (L) 08/18/2017 0910   CHOLHDL 4.2 08/18/2017 0910   CHOLHDL 5.3 08/27/2016 0235   VLDL 66 (H) 08/27/2016 0235   LDLCALC 13 08/18/2017 0910   LDLDIRECT 99.9 09/08/2011 0843     Risk Assessment/Calculations:    CHA2DS2-VASc Score = 2   This indicates a 2.2% annual risk of stroke. The patient's score is based upon: CHF History: 0 HTN History: 1 Diabetes History: 1 Stroke History: 0 Vascular Disease History: 0 Age Score: 0 Gender Score: 0          Physical Exam:    VS:  BP 124/62    Pulse 78    Ht _0  (1.88 m)    Wt 241 lb 12.8 oz (109.7 kg)    SpO2 96%    BMI 31.05 kg/m     Wt Readings from Last 3 Encounters:  06/28/21 241 lb 12.8 oz (109.7 kg)  06/08/21 235 lb (106.6 kg)  05/26/21 249 lb 6.4 oz (113.1 kg)     GEN:  Well nourished, well developed in no acute distress HEENT: Normal NECK: No JVD; No carotid bruits LYMPHATICS: No lymphadenopathy CARDIAC: RRR, no murmurs, rubs, gallops RESPIRATORY:  Clear to auscultation without rales, wheezing or rhonchi  ABDOMEN: Soft, non-tender, non-distended MUSCULOSKELETAL:  No edema; No deformity  SKIN: Warm and dry NEUROLOGIC:  Alert and oriented x 3 PSYCHIATRIC:  Normal affect   ASSESSMENT:    1. Pre-syncope   2. SOB (shortness of breath)   3. Chronic bilateral low back pain, unspecified whether sciatica present   4. Chronic obstructive pulmonary disease, unspecified COPD type ([place])   5. Primary hypertension   6. Hyperlipidemia LDL goal <70   7. Controlled type 2 diabetes mellitus without complication, without long-term current use of insulin  ([place])   8. PAF (paroxysmal atrial fibrillation) (H[facility])    PLAN:    In order of problems listed above:  Presyncope: 2 episode has occurred so far.  Last episode was last Thursday, and again this morning.  Symptom is accompanied by episode of shortness of breath as well.  Blood pressure is normal in the office.  I recommended blood work with CBC, comprehensive metabolic panel and a TSH.  We will obtain a 2-week heart monitor to make sure he is not having arrhythmia as well  Shortness of breath: He has upcoming echocardiogram already scheduled.  Chronic back pain: Previous ABI shows good blood flow.  Last venous Doppler was negative for DVT as well.  He had a total CK last year that was negative, therefore suspicion for myositis is fairly low.  He continues to have back pain with bilateral leg pain, I am concerned of spinal stenosis as a contributing factor.  He was previously seen by Dr. Louanne Skye, I asked him to check with Dr. Louanne Skye again  COPD: No recent acute exacerbation  Hypertension: Blood pressure stable  Hyperlipidemia: Continue pravastatin  DM2: Managed by primary care provider  PAF: On Coumadin.        Medication Adjustments/Labs and Tests Ordered: Current medicines are reviewed at length with the patient today.  Concerns regarding medicines are outlined above.  Orders Placed This Encounter  Procedures   CBC   Comprehensive metabolic panel   TSH   LONG TERM MONITOR-LIVE TELEMETRY (3-14 DAYS)   EKG 12-Lead   Meds ordered this encounter  Medications   nitroGLYCERIN (NITROSTAT) 0.4 MG SL tablet    Sig: Place 1 tablet (0.4 mg total) under the tongue every 5 (five) minutes x 3 doses as needed for chest pain.    Dispense:  25 tablet    Refill:  3    Patient Instructions  Medication Instructions:  Your physician recommends that you continue on your current medications as directed. Please refer to the Current Medication list given to you today.   *If you need a refill on  your cardiac medications before your next appointment, please call your pharmacy*   Lab Work: Your physician recommends that you complete lab work today CBC CMP TSH  If you have labs (blood work) drawn today and your tests are completely normal, you will receive your results only by: [place] (if you have MyChart) OR A paper copy in the mail If you have any lab test that is abnormal or we need to change your treatment, we will call you to review the results.   Testing/Procedures: Keep Echo Appointment on 07/09/2021  ZIO AT Long term monitor-Live Telemetry  Your physician has requested you wear a ZIO patch monitor for 14 days.  This is a single patch monitor. [employer] supplies one patch monitor per enrollment. Additional  stickers are not available.  Please do not apply patch if you will be having a Nuclear Stress Test, Echocardiogram, Cardiac CT, MRI,  or Chest Xray during the period you would be wearing the monitor. The patch cannot be worn during  these tests. You cannot remove and re-apply the ZIO AT patch monitor.  Your ZIO patch monitor will be mailed 3 day USPS to your address on file. It may take 3-5 days to  receive your monitor after you have been enrolled.  Once you have received your monitor, please review the enclosed instructions. Your monitor has  already been registered assigning a specific monitor serial # to you.   Billing and Patient Assistance Program information  Theodore Demark has been supplied with any insurance information on record for billing. [employer] offers a sliding scale Patient Assistance Program for patients without insurance, or whose  insurance does not completely cover the cost of the ZIO patch monitor. You must apply for the  Patient Assistance Program to qualify for the discounted rate. To apply, call [employer] at [phone],  select option 4, select option 2 , ask to apply for the Patient Assistance Program, (you can request an  interpreter if  needed). [employer] will ask your household income and how many people are in your  household. [employer] will quote your out-of-pocket cost based on this information. They will also be able  to set up a 12 month interest free payment plan if needed.  Applying the monitor   Shave hair from upper left chest.  Hold the abrader disc by orange tab. Rub the abrader in 40 strokes over left upper chest as indicated in  your monitor instructions.  Clean area with 4 enclosed alcohol pads. Use all pads to ensure the  area is cleaned thoroughly. Let  dry.  Apply patch as indicated in monitor instructions. Patch will be placed under collarbone on left side of  chest with arrow pointing upward.  Rub patch adhesive wings for 2 minutes. Remove the white label marked "1". Remove the white label  marked "2". Rub patch adhesive wings for 2 additional minutes.  While looking in a mirror, press and release button in center of patch. A small green light will flash 3-4  times. This will be your only indicator that the monitor has been turned on.  Do not shower for the first 24 hours. You may shower after the first 24 hours.  Press the button if you feel a symptom. You will hear a small click. Record Date, Time and Symptom in  the Patient Log.   Starting the Gateway  In your kit there is a Hydrographic surveyor box the size of a cellphone. This is Airline pilot. It transmits all your  recorded data to [facility]. This box must always stay within 10 feet of you. Open the box and push the *  button. There will be a light that blinks orange and then green a few times. When the light stops  blinking, the Gateway is connected to the ZIO patch. Call [employer] at [phone] to confirm your monitor is transmitting.  Returning your monitor  Remove your patch and place it inside the [place]. In the lower half of the Gateway there is a white  bag with prepaid postage on it. Place Gateway in bag and seal. Mail package back to [place] as  soon as  possible. Your physician should have your final report approximately 7 days after you have mailed back  your monitor. Call [place] at [phone] if you have questions regarding your ZIO AT  patch monitor. Call them immediately if you see an orange light blinking on your monitor.  If your monitor falls off in less than 4 days, contact our Monitor department at [phone]. If your  monitor becomes loose or falls off after 4 days call [employer] at [phone] for suggestions on  securing your monitor     Follow-Up: At [facility], you and your health needs are our priority.  As part of our continuing mission to provide you with exceptional heart care, we have created designated Provider Care Teams.  These Care Teams include your primary Cardiologist (physician) and Advanced Practice Providers (APPs -  Physician Assistants and Nurse Practitioners) who all work together to provide you with the care you need, when you need it.  We recommend signing up for the patient portal called "MyChart".  Sign up information is provided on this After Visit Summary.  MyChart is used to connect with patients for Virtual Visits (Telemedicine).  Patients are able to view lab/test results, encounter notes, upcoming appointments, etc.  Non-urgent messages can be sent to your provider as well.   To learn more about what you can do with MyChart, go to [email or web address].    Your next appointment:   5-6 week(s)  The format for your next appointment:   In Person  Provider:   [name], PA-C        Other Instructions     Signed, [name], [place]  06/30/2021 10:43 PM    [place]

## 2021-06-28 NOTE — Patient Instructions (Signed)
Medication Instructions:  Your physician recommends that you continue on your current medications as directed. Please refer to the Current Medication list given to you today.   *If you need a refill on your cardiac medications before your next appointment, please call your pharmacy*   Lab Work: Your physician recommends that you complete lab work today CBC CMP TSH  If you have labs (blood work) drawn today and your tests are completely normal, you will receive your results only by: [place] (if you have MyChart) OR A paper copy in the mail If you have any lab test that is abnormal or we need to change your treatment, we will call you to review the results.   Testing/Procedures: Keep Echo Appointment on 07/09/2021  ZIO AT Long term monitor-Live Telemetry  Your physician has requested you wear a ZIO patch monitor for 14 days.  This is a single patch monitor. [employer] supplies one patch monitor per enrollment. Additional  stickers are not available.  Please do not apply patch if you will be having a Nuclear Stress Test, Echocardiogram, Cardiac CT, MRI,  or Chest Xray during the period you would be wearing the monitor. The patch cannot be worn during  these tests. You cannot remove and re-apply the ZIO AT patch monitor.  Your ZIO patch monitor will be mailed 3 day USPS to your address on file. It may take 3-5 days to  receive your monitor after you have been enrolled.  Once you have received your monitor, please review the enclosed instructions. Your monitor has  already been registered assigning a specific monitor serial # to you.   Billing and Patient Assistance Program information  [name] has been supplied with any insurance information on record for billing. [employer] offers a sliding scale Patient Assistance Program for patients without insurance, or whose  insurance does not completely cover the cost of the ZIO patch monitor. You must apply for the  Patient Assistance  Program to qualify for the discounted rate. To apply, call [employer] at [phone],  select option 4, select option 2 , ask to apply for the Patient Assistance Program, (you can request an  interpreter if needed). [employer] will ask your household income and how many people are in your  household. [employer] will quote your out-of-pocket cost based on this information. They will also be able  to set up a 12 month interest free payment plan if needed.  Applying the monitor   Shave hair from upper left chest.  Hold the abrader disc by orange tab. Rub the abrader in 40 strokes over left upper chest as indicated in  your monitor instructions.  Clean area with 4 enclosed alcohol pads. Use all pads to ensure the area is cleaned thoroughly. Let  dry.  Apply patch as indicated in monitor instructions. Patch will be placed under collarbone on left side of  chest with arrow pointing upward.  Rub patch adhesive wings for 2 minutes. Remove the white label marked "1". Remove the white label  marked "2". Rub patch adhesive wings for 2 additional minutes.  While looking in a mirror, press and release button in center of patch. A small green light will flash 3-4  times. This will be your only indicator that the monitor has been turned on.  Do not shower for the first 24 hours. You may shower after the first 24 hours.  Press the button if you feel a symptom. You will hear a small click. Record Date, Time and Symptom in  the Patient Log.   Starting the Gateway  In your kit there is a Hydrographic surveyor box the size of a cellphone. This is Airline pilot. It transmits all your  recorded data to [facility]. This box must always stay within 10 feet of you. Open the box and push the *  button. There will be a light that blinks orange and then green a few times. When the light stops  blinking, the Gateway is connected to the ZIO patch. Call [employer] at [phone] to confirm your monitor is transmitting.  Returning your  monitor  Remove your patch and place it inside the [place]. In the lower half of the Gateway there is a white  bag with prepaid postage on it. Place Gateway in bag and seal. Mail package back to [facility] as soon as  possible. Your physician should have your final report approximately 7 days after you have mailed back  your monitor. Call [place] at [phone] if you have questions regarding your ZIO AT  patch monitor. Call them immediately if you see an orange light blinking on your monitor.  If your monitor falls off in less than 4 days, contact our Monitor department at [phone]. If your  monitor becomes loose or falls off after 4 days call [employer] at [phone] for suggestions on  securing your monitor     Follow-Up: At [facility], you and your health needs are our priority.  As part of our continuing mission to provide you with exceptional heart care, we have created designated Provider Care Teams.  These Care Teams include your primary Cardiologist (physician) and Advanced Practice Providers (APPs -  Physician Assistants and Nurse Practitioners) who all work together to provide you with the care you need, when you need it.  We recommend signing up for the patient portal called "MyChart".  Sign up information is provided on this After Visit Summary.  MyChart is used to connect with patients for Virtual Visits (Telemedicine).  Patients are able to view lab/test results, encounter notes, upcoming appointments, etc.  Non-urgent messages can be sent to your provider as well.   To learn more about what you can do with MyChart, go to [email or web address].    Your next appointment:   5-6 week(s)  The format for your next appointment:   In Person  Provider:   [name], PA-C        Other Instructions

## 2021-06-28 NOTE — Progress Notes (Incomplete)
Cardiology Office Note:    Date:  06/28/2021   ID:  [name], DOB [birth year], MRN [id number]  PCP:  Merryl Hacker, No   [facility] Providers Cardiologist:  Minus Breeding, MD { Click to update primary MD,subspecialty MD or APP then REFRESH:1}    Referring MD: No ref. provider found   No chief complaint on file. ***  History of Present Illness:    [name] is a 66 y.o. male with a hx of COPD/emphysema, HTN, HLD, DM II, obesity, OSA intolerant of CPAP, history of tobacco abuse and history of atrial fibrillation and atrial flutter. Patient had atrial flutter ablation in 2014. Since he has both paroxysmal atrial fibrillation and atrial flutter, he underwent EP study with a second ablation on 04/21/2015. Cardiac catheterization in May 2018 showed widely patent coronary arteries with normal EF, moderate LVH. Echocardiogram obtained on 08/14/2019 showed EF 60 to 65%, moderate LVH, grade 2 DD, moderately elevated pulmonary artery systolic pressure, RVSP 81.1 mmHg, severe left atrial enlargement, moderate right atrial enlargement, mild MR, mild to moderate TR. Carotid Doppler obtained on 09/30/2019 showed 1 to 39% disease in bilateral internal carotid artery. Cardiac MRI performed on 12/16/2019 showed EF 66%, moderate concentric LVH, no myocardial LGE to suggest prior MI, infiltrative cardiac disease or myocarditis. No evidence of cardiac amyloidosis.  He unfortunately had a quite significant leg pain after cardiac MRI.  I saw the patient in September 2021, I felt his pain is most consistent with myositis versus compressed nerve in the lower back.  Total CK was negative which argues against myositis.  Lower extremity arterial Doppler obtained on 02/12/2020 showed normal ABI and a triphasic blood flow throughout both legs, no vascular blockage was seen to explain his leg discomfort.    He was last seen by Dr. Percival Spanish in August 2022 at which time he continue to have back pain and occasional palpitation.   I last saw the patient on 03/10/2021, he again complained of significant leg discomfort.  His lower extremity was tender to touch.  Venous Doppler obtained on 04/29/2021 was negative for DVT.  More recently, he called the cardiology office a few days ago complaining of intermittent shortness of breath for 1 month.  Past Medical History:  Diagnosis Date   Arthritis    lower back   Asthma    Atrial fibrillation [facility])    Atrial flutter [facility])    s/p CTI ablation by Dr Rayann Heman   Brain aneurysm 2009   questionable. A follow up CTA in 2009 showed no evidence of   Chronic back pain    DDD/stenosis   Colon polyps    9 polyps removed 01/27/77   Complication of anesthesia 09/11/2012   slow to awaken after ablation   COPD (chronic obstructive pulmonary disease) ([place])    Diabetes mellitus    takes Metformin and Glimepiride daily   Emphysema    GERD (gastroesophageal reflux disease)    takes Omeprazole bid   Heart failure ([place])    History of shingles    HLD (hyperlipidemia)    takes Pravastatin daily   HTN (hypertension)    takes Prinizide daily   Obesity    OSA (obstructive sleep apnea)    not always using cpap   Overdose 2009   unintentional Flecanide overdose   Peripheral neuropathy    Short-term memory loss    Tobacco abuse     Past Surgical History:  Procedure Laterality Date   ANTERIOR CERVICAL DECOMP/DISCECTOMY FUSION  01/04/2012  Procedure: ANTERIOR CERVICAL DECOMPRESSION/DISCECTOMY FUSION 1 LEVEL/HARDWARE REMOVAL;  Surgeon: Ophelia Charter, MD;  Location: [place] NEURO ORS;  Service: Neurosurgery;  Laterality: N/A;  explore cervical fusion Cervical six - seven  with removal of codman plate anterior cervical decompression with fusion interbody prothesis plating and bonegraft   APPENDECTOMY  2-45yrs ago   [place] N/A 09/11/2012   PT DID NOT HAVE AN ATRIAL FIBRILLATION ABLATION IN 2014!  ATRIAL FLUTTER ABLATION ONLY   ATRIAL FLUTTER ABLATION  09/11/2012   CTI  ablation by Dr Rayann Heman   BIOPSY  11/07/2019   Procedure: BIOPSY;  Surgeon: Milus Banister, MD;  Location: [facility] ENDOSCOPY;  Service: Endoscopy;;   CARDIAC CATHETERIZATION  2008   no significant CAD   CARDIOVERSION  05/05/2011   Procedure: CARDIOVERSION;  Surgeon: Lelon Perla, MD;  Location: [place];  Service: Cardiovascular;  Laterality: N/A;   CARDIOVERSION Bilateral 07/26/2012   Procedure: CARDIOVERSION;  Surgeon: Minus Breeding, MD;  Location: [facility] ENDOSCOPY;  Service: Cardiovascular;  Laterality: Bilateral;   CARPAL TUNNEL RELEASE  99/2000   bilateral   COLONOSCOPY WITH PROPOFOL N/A 12/13/2012   Procedure: COLONOSCOPY WITH PROPOFOL;  Surgeon: Milus Banister, MD;  Location: [facility] ENDOS[facility];  Service: Endoscopy;  Laterality: N/A;   ELECTROPHYSIOLOGIC STUDY N/A 04/21/2015   Procedure: Atrial Fibrillation Ablation;  Surgeon: Thompson Grayer, MD;  Location: [place] CV LAB;  Service: Cardiovascular;  Laterality: N/A;   ESOPHAGOGASTRODUODENOSCOPY (EGD) WITH PROPOFOL N/A 11/07/2019   Procedure: ESOPHAGOGASTRODUODENOSCOPY (EGD) WITH PROPOFOL;  Surgeon: Milus Banister, MD;  Location: [facility]OS[facility];  Service: Endoscopy;  Laterality: N/A;   HERNIA REPAIR     KNEE ARTHROSCOPY WITH MEDIAL MENISECTOMY Left 04/23/2021   Procedure: LEFT KNEE ARTHROSCOPY WITH PARTIAL MEDIAL MENISCECTOMY;  Surgeon: [name], MD;  Location: [place];  Service: Orthopedics;  Laterality: Left;   KNEE SURGERY  6-64yrs ago   left   LEFT HEART CATH AND CORONARY ANGIOGRAPHY N/A 09/19/2016   Procedure: Left Heart Cath and Coronary Angiography;  Surgeon: Belva Crome, MD;  Location: [place] CV LAB;  Service: Cardiovascular;  Laterality: N/A;   Left inguinal hernia repair     as a child   NASAL SEPTOPLASTY W/ TURBINOPLASTY Bilateral 02/19/2014   Procedure: NASAL SEPTOPLASTY WITH BILATERAL TURBINATE REDUCTION;  Surgeon: Jodi Marble, MD;  Location: [place];  Service: ENT;  Laterality: Bilateral;   NECK SURGERY  108yrs ago    right shoulder surgery  4-30yrs ago   cyst removed   TEE WITHOUT CARDIOVERSION  05/05/2011   Procedure: TRANSESOPHAGEAL ECHOCARDIOGRAM (TEE);  Surgeon: Lelon Perla, MD;  Location: [facility] ENDOSCOPY;  Service: Cardiovascular;  Laterality: N/A;   TEE WITHOUT CARDIOVERSION N/A 04/21/2015   Procedure: TRANSESOPHAGEAL ECHOCARDIOGRAM (TEE);  Surgeon: Sueanne Margarita, MD;  Location: [facility] ENDOSCOPY;  Service: Cardiovascular;  Laterality: N/A;   THROAT SURGERY  4-63yrs ago   "thought " it was cancer but came back not   TONSILLECTOMY      Current Medications: No outpatient medications have been marked as taking for the 06/28/21 encounter (Appointment) with [name], [place].     Allergies:   Adhesive [tape] and Latex   Social History   Socioeconomic History   Marital status: Married    Spouse name: Not on file   Number of children: 2   Years of education: Not on file   Highest education level: Not on file  Occupational History   Occupation: Dealer   Tobacco Use   Smoking status:  Every Day    Packs/day: 1.00    Years: 49.00    Pack years: 49.00    Types: Cigarettes   Smokeless tobacco: Never   Tobacco comments:    Smoking a pack per day as of 04/19/21 hfb  Vaping Use   Vaping Use: Never used  Substance and Sexual Activity   Alcohol use: Not Currently    Alcohol/week: 0.0 standard drinks    Comment: 12/05/17 pt stated he has drinked 1/2 drink in 3 years   Drug use: No   Sexual activity: Yes  Other Topics Concern   Not on file  Social History Narrative   Daily caffeine(Mountain Dew)       Lives in [place] with spouse.   Unemployed due to chronic back/ leg pain         Social Determinants of Health   Financial Resource Strain: Not on file  Food Insecurity: Not on file  Transportation Needs: Not on file  Physical Activity: Not on file  Stress: Not on file  Social Connections: Not on file     Family History: The patient's ***family history includes Diabetes in his  maternal grandmother and mother; Heart disease in his father; Lung cancer in his father. There is no history of Colon cancer, Anesthesia problems, Hypotension, Malignant hyperthermia, Pseudochol deficiency, Esophageal cancer, Pancreatic cancer, or Stomach cancer.  ROS:   Please see the history of present illness.    *** All other systems reviewed and are negative.  EKGs/Labs/Other Studies Reviewed:    The following studies were reviewed today: ***  EKG:  EKG is *** ordered today.  The ekg ordered today demonstrates ***  Recent Labs: 01/06/2021: BNP 90.8 04/23/2021: BUN 25; Creatinine, Ser 1.26; Hemoglobin 11.0; Platelets 133; Potassium 3.2; Sodium 140  Recent Lipid Panel    Component Value Date/Time   CHOL 122 08/18/2017 0910   TRIG 400 (H) 08/18/2017 0910   HDL 29 (L) 08/18/2017 0910   CHOLHDL 4.2 08/18/2017 0910   CHOLHDL 5.3 08/27/2016 0235   VLDL 66 (H) 08/27/2016 0235   LDLCALC 13 08/18/2017 0910   LDLDIRECT 99.9 09/08/2011 0843     Risk Assessment/Calculations:   {Does this patient have ATRIAL FIBRILLATION?:[phone]}       Physical Exam:    VS:  There were no vitals taken for this visit.    Wt Readings from Last 3 Encounters:  06/08/21 235 lb (106.6 kg)  05/26/21 249 lb 6.4 oz (113.1 kg)  05/11/21 247 lb (112 kg)     GEN: *** Well nourished, well developed in no acute distress HEENT: Normal NECK: No JVD; No carotid bruits LYMPHATICS: No lymphadenopathy CARDIAC: ***RRR, no murmurs, rubs, gallops RESPIRATORY:  Clear to auscultation without rales, wheezing or rhonchi  ABDOMEN: Soft, non-tender, non-distended MUSCULOSKELETAL:  No edema; No deformity  SKIN: Warm and dry NEUROLOGIC:  Alert and oriented x 3 PSYCHIATRIC:  Normal affect   ASSESSMENT:    No diagnosis found. PLAN:    In order of problems listed above:  ***      {Are you ordering a CV Procedure (e.g. stress test, cath, DCCV, TEE, etc)?   Press F2        :[id number]}    Medication  Adjustments/Labs and Tests Ordered: Current medicines are reviewed at length with the patient today.  Concerns regarding medicines are outlined above.  No orders of the defined types were placed in this encounter.  No orders of the defined types were placed in this encounter.  There are no Patient Instructions on file for this visit.   Hilbert Corrigan, [place]  06/28/2021 8:50 AM    [facility]

## 2021-06-28 NOTE — Patient Instructions (Signed)
HOLD TONIGHT ONLY and then continue taking 1 tablet daily except for 1/2 tablet every Monday, Wednesday and Friday. Repeat INR  in 4 weeks.

## 2021-06-30 ENCOUNTER — Encounter: Payer: Self-pay | Admitting: Physician Assistant

## 2021-07-01 ENCOUNTER — Other Ambulatory Visit: Payer: Self-pay | Admitting: Cardiology

## 2021-07-01 ENCOUNTER — Other Ambulatory Visit: Payer: Self-pay | Admitting: Endocrinology

## 2021-07-06 ENCOUNTER — Telehealth: Payer: Self-pay

## 2021-07-06 NOTE — Telephone Encounter (Signed)
Pt LVM stating that his insurance would no longer cover his Decxom. I gave him the # to [place] and [place] to contact either one of them to get the process started and they will fax over the information.

## 2021-07-07 ENCOUNTER — Other Ambulatory Visit: Payer: Self-pay | Admitting: Orthopaedic Surgery

## 2021-07-08 ENCOUNTER — Other Ambulatory Visit: Payer: Self-pay

## 2021-07-08 MED ORDER — TORSEMIDE 20 MG PO TABS: ORAL_TABLET | ORAL | 3 refills | Status: DC

## 2021-07-09 ENCOUNTER — Ambulatory Visit (HOSPITAL_COMMUNITY): Payer: Commercial Managed Care - HMO | Attending: Cardiology

## 2021-07-09 ENCOUNTER — Other Ambulatory Visit: Payer: Self-pay

## 2021-07-09 DIAGNOSIS — I50812 Chronic right heart failure: Secondary | ICD-10-CM | POA: Insufficient documentation

## 2021-07-09 LAB — ECHOCARDIOGRAM COMPLETE
AR max vel: 1.74 cm2
AV Area VTI: 1.63 cm2
AV Area mean vel: 1.61 cm2
AV Mean grad: 12.5 mmHg
AV Peak grad: 23.8 mmHg
Ao pk vel: 2.44 m/s
Area-P 1/2: 2.81 cm2
S' Lateral: 2.7 cm

## 2021-07-12 ENCOUNTER — Other Ambulatory Visit: Payer: Self-pay | Admitting: Urology

## 2021-07-14 ENCOUNTER — Ambulatory Visit: Payer: 59 | Admitting: Physician Assistant

## 2021-07-16 DIAGNOSIS — R55 Syncope and collapse: Secondary | ICD-10-CM | POA: Diagnosis not present

## 2021-07-16 DIAGNOSIS — R0602 Shortness of breath: Secondary | ICD-10-CM

## 2021-07-26 ENCOUNTER — Telehealth: Payer: Self-pay

## 2021-07-26 ENCOUNTER — Telehealth: Payer: Self-pay | Admitting: *Deleted

## 2021-07-26 ENCOUNTER — Other Ambulatory Visit: Payer: Self-pay | Admitting: Endocrinology

## 2021-07-26 ENCOUNTER — Other Ambulatory Visit: Payer: Self-pay

## 2021-07-26 ENCOUNTER — Ambulatory Visit (INDEPENDENT_AMBULATORY_CARE_PROVIDER_SITE_OTHER): Payer: Managed Care, Other (non HMO)

## 2021-07-26 DIAGNOSIS — I480 Paroxysmal atrial fibrillation: Secondary | ICD-10-CM | POA: Diagnosis not present

## 2021-07-26 DIAGNOSIS — Z5181 Encounter for therapeutic drug level monitoring: Secondary | ICD-10-CM

## 2021-07-26 DIAGNOSIS — Z7901 Long term (current) use of anticoagulants: Secondary | ICD-10-CM

## 2021-07-26 LAB — POCT INR: INR: 3.1 — AB (ref 2.0–3.0)

## 2021-07-26 MED ORDER — INSULIN LISPRO (1 UNIT DIAL) 100 UNIT/ML (KWIKPEN): PEN_INJECTOR | SUBCUTANEOUS | 3 refills | Status: DC

## 2021-07-26 NOTE — Telephone Encounter (Signed)
Thank you for letting me know, did he have any reading for those 4 days? ?

## 2021-07-26 NOTE — Telephone Encounter (Signed)
Hello, ? ?This is a Psychologist, forensic to inform you the following patient is sending back their Zio AT heart monitor earlier than the prescribed wear time due to adhesion issues.  This patient was prescribed to wear the monitor for 14 days, but the device is being sent back after approximately 4 days of wear. The patient does not want a replacement due to skin irritation experienced. ? ? ?Patient Initials: S.B. ?MRN: [id number] ?Provider: Minus Breeding ? ?Reference# [id number] ? ?Warm Regards, ?[employer] Customer Care ?[phone] ?  ? ?ref:_[id number]:ref ?

## 2021-07-26 NOTE — Telephone Encounter (Signed)
Patient left a voicemail stating that his insurance wont cover Insulin Aspart (Fiasp) but will cover Humalog. Patient would like to know if this alternative can be sent in ?

## 2021-07-26 NOTE — Telephone Encounter (Signed)
Does that mean he only had 1 day of readable strips? ?

## 2021-07-26 NOTE — Telephone Encounter (Signed)
Patient informed that Humalog has been sent in ?

## 2021-07-26 NOTE — Patient Instructions (Signed)
continue taking 1 tablet daily except for 1/2 tablet every Monday, Wednesday and Friday. Repeat INR  in 6 weeks. Eat greens tonight. ?

## 2021-07-29 ENCOUNTER — Telehealth: Payer: Self-pay

## 2021-07-29 ENCOUNTER — Ambulatory Visit (INDEPENDENT_AMBULATORY_CARE_PROVIDER_SITE_OTHER): Payer: Managed Care, Other (non HMO) | Admitting: Endocrinology

## 2021-07-29 ENCOUNTER — Other Ambulatory Visit: Payer: Self-pay

## 2021-07-29 ENCOUNTER — Encounter: Payer: Self-pay | Admitting: Endocrinology

## 2021-07-29 VITALS — BP 156/68 | HR 67 | Ht 74.0 in | Wt 249.0 lb

## 2021-07-29 DIAGNOSIS — E1122 Type 2 diabetes mellitus with diabetic chronic kidney disease: Secondary | ICD-10-CM | POA: Diagnosis not present

## 2021-07-29 DIAGNOSIS — N1831 Chronic kidney disease, stage 3a: Secondary | ICD-10-CM | POA: Diagnosis not present

## 2021-07-29 DIAGNOSIS — Z794 Long term (current) use of insulin: Secondary | ICD-10-CM | POA: Diagnosis not present

## 2021-07-29 LAB — POCT GLYCOSYLATED HEMOGLOBIN (HGB A1C): Hemoglobin A1C: 7.6 % — AB (ref 4.0–5.6)

## 2021-07-29 MED ORDER — HUMALOG KWIKPEN 100 UNIT/ML ~~LOC~~ SOPN: 0.0000 [IU] | PEN_INJECTOR | Freq: Three times a day (TID) | SUBCUTANEOUS | 3 refills | Status: DC

## 2021-07-29 MED ORDER — INSULIN LISPRO (1 UNIT DIAL) 100 UNIT/ML (KWIKPEN): 0.0000 [IU] | PEN_INJECTOR | Freq: Three times a day (TID) | SUBCUTANEOUS | 3 refills | Status: DC

## 2021-07-29 NOTE — Telephone Encounter (Signed)
Spoke with the pharmacy to let them know it has been written as brand, says it went through with a copay of $25 ?

## 2021-07-29 NOTE — Progress Notes (Signed)
? ?Subjective:  ? ? Patient ID: [name], male    DOB: December 07, 1955, 66 y.o.   MRN: [id number] ? ?HPI ?Pt returns for f/u of diabetes mellitus:  ?DM type: Insulin-requiring type 2 ?Dx'ed: 2012 ?Complications: stage 3 CRI.   ?Therapy: insulin since 9390, and Trulicity. ?DKA: never ?Severe hypoglycemia: once (2021, when a meal was delayed after taking Novolog).  ?Pancreatitis: never ?Pancreatic imaging: normal on 2017 CT.  ?SDOH: he declines insulins other than Novolog/Fiasp.  ?Other: he takes multiple daily injections; he did not tolerate Jardiance (dehydration) or time-release insulins (rash); metformin dosage is limited by abd pain, and Trulicity by nausea.   ?Interval history: I reviewed continuous glucose monitor data.  Glucose varies from 130-260.  It is in general highest at [place].  It is lowest at [place], but there is very little trend throughout the day.  On the insulin, he had hypoglycemia approx BIW.  This usually happened in the afternoon or at HS.  He has reduced humalog to 3 times a day (just before each meal) (0-50 units)-(45-80 units)-(0-50 units).  He sometimes goes 2-3 days without taking insulin.   ?Past Medical History:  ?Diagnosis Date  ? Arthritis   ? lower back  ? Asthma   ? Atrial fibrillation ([place])   ? Atrial flutter ([place])   ? s/p CTI ablation by Dr Rayann Heman  ? Brain aneurysm 2009  ? questionable. A follow up CTA in 2009 showed no evidence of  ? Chronic back pain   ? DDD/stenosis  ? Colon polyps   ? 9 polyps removed 10/13/11  ? Complication of anesthesia 09/11/2012  ? slow to awaken after ablation  ? COPD (chronic obstructive pulmonary disease) ([place])   ? Diabetes mellitus   ? takes Metformin and Glimepiride daily  ? Emphysema   ? GERD (gastroesophageal reflux disease)   ? takes Omeprazole bid  ? Heart failure ([place])   ? History of shingles   ? HLD (hyperlipidemia)   ? takes Pravastatin daily  ? HTN (hypertension)   ? takes Prinizide daily  ? Obesity   ? OSA (obstructive sleep apnea)   ? not always  using cpap  ? Overdose 2009  ? unintentional Flecanide overdose  ? Peripheral neuropathy   ? Short-term memory loss   ? Tobacco abuse   ? ? ?Past Surgical History:  ?Procedure Laterality Date  ? ANTERIOR CERVICAL DECOMP/DISCECTOMY FUSION  01/04/2012  ? Procedure: ANTERIOR CERVICAL DECOMPRESSION/DISCECTOMY FUSION 1 LEVEL/HARDWARE REMOVAL;  Surgeon: Ophelia Charter, MD;  Location: [facility];  Service: Neurosurgery;  Laterality: N/A;  explore cervical fusion Cervical six - seven  with removal of codman plate anterior cervical decompression with fusion interbody prothesis plating and bonegraft  ? APPENDECTOMY  2-25yr ago  ? ATRIAL FIBRILLATION ABLATION N/A 09/11/2012  ? PT DID NOT HAVE AN ATRIAL FIBRILLATION ABLATION IN 2014!  ATRIAL FLUTTER ABLATION ONLY  ? ATRIAL FLUTTER ABLATION  09/11/2012  ? CTI ablation by Dr ARayann Heman ? BIOPSY  11/07/2019  ? Procedure: BIOPSY;  Surgeon: JMilus Banister MD;  [facility];  Service: Endoscopy;;  ? CARDIAC CATHETERIZATION  2008  ? no significant CAD  ? CARDIOVERSION  05/05/2011  ? Procedure: CARDIOVERSION;  Surgeon: BLelon Perla MD;  [facility];  Service: Cardiovascular;  Laterality: N/A;  ? CARDIOVERSION Bilateral 07/26/2012  ? Procedure: CARDIOVERSION;  Surgeon: JMinus Breeding MD;  [facility];  Service: Cardiovascular;  Laterality: Bilateral;  ? CARPAL TUNNEL RELEASE  99/2000  ?  bilateral  ? COLONOSCOPY WITH PROPOFOL N/A 12/13/2012  ? Procedure: COLONOSCOPY WITH PROPOFOL;  Surgeon: Milus Banister, MD;  [facility];  Service: Endoscopy;  Laterality: N/A;  ? ELECTROPHYSIOLOGIC STUDY N/A 04/21/2015  ? Procedure: Atrial Fibrillation Ablation;  Surgeon: Thompson Grayer, MD;  Location: [place] [facility] LAB;  Service: Cardiovascular;  Laterality: N/A;  ? ESOPHAGOGASTRODUODENOSCOPY (EGD) WITH PROPOFOL N/A 11/07/2019  ? Procedure: ESOPHAGOGASTRODUODENOSCOPY (EGD) WITH PROPOFOL;  Surgeon: Milus Banister, MD;  [facility];  Service:  Endoscopy;  Laterality: N/A;  ? HERNIA REPAIR    ? KNEE ARTHROSCOPY WITH MEDIAL MENISECTOMY Left 04/23/2021  ? Procedure: LEFT KNEE ARTHROSCOPY WITH PARTIAL MEDIAL MENISCECTOMY;  Surgeon: [name], MD;  Location: [facility];  Service: Orthopedics;  Laterality: Left;  ? KNEE SURGERY  6-72yr ago  ? left  ? LEFT HEART CATH AND CORONARY ANGIOGRAPHY N/A 09/19/2016  ? Procedure: Left Heart Cath and Coronary Angiography;  Surgeon: SBelva Crome MD;  [facility];  Service: Cardiovascular;  Laterality: N/A;  ? Left inguinal hernia repair    ? as a child  ? NASAL SEPTOPLASTY W/ TURBINOPLASTY Bilateral 02/19/2014  ? Procedure: NASAL SEPTOPLASTY WITH BILATERAL TURBINATE REDUCTION;  Surgeon: KJodi Marble MD;  [facility]  Service: ENT;  Laterality: Bilateral;  ? NECK SURGERY  181yrago  ? right shoulder surgery  4-5y50yrgo  ? cyst removed  ? TEE WITHOUT CARDIOVERSION  05/05/2011  ? Procedure: TRANSESOPHAGEAL ECHOCARDIOGRAM (TEE);  Surgeon: BriLelon PerlaD;  [facility];  Laterality: N/A;  ? TEE WITHOUT CARDIOVERSION N/A 04/21/2015  ? Procedure: TRANSESOPHAGEAL ECHOCARDIOGRAM (TEE);  Surgeon: TraSueanne MargaritaD;  [facility];  Service: Cardiovascular;  Laterality: N/A;  ? THROAT SURGERY  [id number]  ? "thought " it was cancer but came back not  ? TONSILLECTOMY    ? ? ?Social History  ? ?Socioeconomic History  ? Marital status: Married  ?  Spouse name: Not on file  ? Number of children: 2  ? Years of education: Not on file  ? Highest education level: Not on file  ?Occupational History  ? Occupation: MechDealerTobacco Use  ? Smoking status: Every Day  ?  Packs/day: 1.00  ?  Years: 49.00  ?  Pack years: 49.00  ?  Types: Cigarettes  ? Smokeless tobacco: Never  ? Tobacco comments:  ?  Smoking a pack per day as of 04/19/21 hfb  ?Vaping Use  ? Vaping Use: Never used  ?Substance and Sexual Activity  ? Alcohol use: Not Currently  ?  Alcohol/week: 0.0 standard  drinks  ?  Comment: 12/05/17 pt stated he has drinked 1/2 drink in 3 years  ? Drug use: No  ? Sexual activity: Yes  ?Other Topics Concern  ? Not on file  ?Social History Narrative  ? Daily caffeine(Mountain Dew)   ?   [place]  ? Unemployed due to chronic back/ leg pain  ?   ?   ? ?Social Determinants of Health  ? ?Financial Resource Strain: Not on file  ?Food Insecurity: Not on file  ?Transportation Needs: Not on file  ?Physical Activity: Not on file  ?Stress: Not on file  ?Social Connections: Not on file  ?Intimate Partner Violence: Not on file  ? ? ?Current Outpatient Medications on File Prior to Visit  ?Medication Sig Dispense Refill  ? albuterol (PROAIR HFA) 108 (90 Base) MCG/ACT  inhaler Inhale 1-2 puffs into the lungs every 6 (six) hours as needed for wheezing or shortness of breath. 18 g 5  ? b complex vitamins tablet Take 1 tablet by mouth daily.     ? bisoprolol (ZEBETA) 10 MG tablet Take 10 mg by mouth daily.     ? Continuous Blood Gluc Receiver ([place]) DEVI     ? Continuous Blood Gluc Sensor (DEXCOM G6 SENSOR) MISC 1 Device by Does not apply route See admin instructions. Change every 10 days 9 each 3  ? Continuous Blood Gluc Transmit (DEXCOM G6 TRANSMITTER) MISC USE AS DIRECTED 1 each 3  ? diltiazem (CARDIZEM CD) 120 MG 24 hr capsule Take 120 mg by mouth daily.    ? Dulaglutide (TRULICITY) 3 RK/2.7CW SOPN Inject 3 mg as directed once a week. 6 mL 3  ? EASY COMFORT PEN NEEDLES 31G X 5 MM MISC USE 3 TIMES DAILY 300 each 3  ? gabapentin (NEURONTIN) 300 MG capsule Take 300 mg by mouth See admin instructions. Take 1 capsule (300 mg) by mouth in the morning & take 3 capsules (900 mg) by mouth at night    ? losartan (COZAAR) 50 MG tablet Take 1 tablet (50 mg total) by mouth daily. 90 tablet 3  ? metolazone (ZAROXOLYN) 2.5 MG tablet TAKE 1 TABLET BY MOUTH ON TUESDAY AND THURSDAY 30 MINUTES BEFORE TAKING TORSEMIDE OR AS DIRECTED 15 tablet 3  ? Multiple Vitamin  (MULTIVITAMIN WITH MINERALS) TABS Take 1 tablet by mouth daily.     ? Multiple Vitamins-Minerals (ZINC PO) Take 25 mg by mouth daily.    ? nitroGLYCERIN (NITROSTAT) 0.4 MG SL tablet Place 1 tablet (0.4 mg total) under

## 2021-07-29 NOTE — Patient Instructions (Addendum)
Please continue the same Trulicity, and:  ?please reduce the Humalog to 0-20 units, 3 times a day (just before each meal).  ?check your blood sugar twice a day.  vary the time of day when you check, between before the 3 meals, and at bedtime.  also check if you have symptoms of your blood sugar being too high or too low.  please keep a record of the readings and bring it to your next appointment here (or you can bring the meter itself).  You can write it on any piece of paper.  please call us sooner if your blood sugar goes below 70, or if you have a lot of readings over 200.   ?Please come back for a follow-up appointment in 1 month.   ?

## 2021-07-29 NOTE — Telephone Encounter (Signed)
Incoming call from the patient saying that he has been having an issue with the pharmacy regarding his insulin. Spoke with the pharmacy to receive clarification and says that his insurance wont cover the generic brand but the insulin must be sent in as brand name Humalog with instructions as dispense as written in order for his insurance to cover the medication. Will update the patient once a response has been received. ?

## 2021-08-02 ENCOUNTER — Ambulatory Visit: Payer: 59 | Admitting: Dermatology

## 2021-08-03 ENCOUNTER — Other Ambulatory Visit: Payer: Self-pay | Admitting: Cardiology

## 2021-08-03 NOTE — Telephone Encounter (Signed)
Prescription refill request received for warfarin ?Lov: 06/28/21 [name])  ?Next INR check: 09/06/21 ?Warfarin tablet strength: '5mg'$  ? ?Appropriate dose and refill sent to requested pharmacy.  ?

## 2021-08-05 ENCOUNTER — Other Ambulatory Visit: Payer: Self-pay

## 2021-08-05 ENCOUNTER — Encounter: Payer: Self-pay | Admitting: Pulmonary Disease

## 2021-08-05 ENCOUNTER — Ambulatory Visit: Payer: Managed Care, Other (non HMO) | Admitting: Pulmonary Disease

## 2021-08-05 VITALS — BP 136/72 | HR 74 | Temp 98.6°F | Wt 245.0 lb

## 2021-08-05 DIAGNOSIS — M5416 Radiculopathy, lumbar region: Secondary | ICD-10-CM

## 2021-08-05 DIAGNOSIS — Z720 Tobacco use: Secondary | ICD-10-CM | POA: Diagnosis not present

## 2021-08-05 DIAGNOSIS — G4733 Obstructive sleep apnea (adult) (pediatric): Secondary | ICD-10-CM

## 2021-08-05 DIAGNOSIS — G629 Polyneuropathy, unspecified: Secondary | ICD-10-CM | POA: Diagnosis not present

## 2021-08-05 NOTE — Patient Instructions (Addendum)
?  X refer to Neurology for muscle wasting/weakness, previous neck surgery 2013, tremors ? ?Use Anoro daily ?Try to QUIT smoking ! ?

## 2021-08-05 NOTE — Assessment & Plan Note (Signed)
Smoking cessation again emphasized is the most important intervention.  He is not ready to commit to a quit attempt ?

## 2021-08-05 NOTE — Assessment & Plan Note (Signed)
Significant OSA but has not tolerated CPAP therapy. ?He may be eligible for hypoglossal nerve implant but this takes a backseat now to current issues of pain and neurological situation ?

## 2021-08-05 NOTE — Progress Notes (Signed)
? ?Subjective:  ? ? Patient ID: [name], male    DOB: 06-23-1955, 66 y.o.   MRN: [id number] ? ?HPI ? ?66 yo  smoker followed for COPD and obstructive sleep apnea ? ? ?PMH -chronic A. fib status post ablation in 2016 ?Has OSA -CPAP intolerant  ?DM-2 ?Cirrhosis, previous EtOH use ? 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 ?Last OV 04/2021 -pre -op eval for arthroscopy, reviewed ?He has been seeing Dr. Louanne Skye and Dr. Lorin Mercy from orthopedics, underwent epidural steroid injections but complains of severe pain in his back.  He complains of weakness in his arms and legs that no one has addressed and tremors that have been increasing over the past few months. ?He ambulates with a walker ?He appears much worse than the last time I saw him ?For some reason orthopedics referred him to a pain clinic but he was turned down.  He has a PCP appointment scheduled for next week.  He used to be with Dr. Rex Kras. ?He is on oxycodone but this is not helping ? ? ?Significant tests/ events reviewed ? ?LDCT 06/2019 RADS 1 .  Mild emphysema.  ?  ?Spirometry 01/2010 showed ratio of 68, FEV 1 was 72%, smaller airways 55%, no significant BD response  ?  ?PSG from [facility] - severe with AHI 31.h corrected by CPAP 5 cm (wt was 250 lbs) ?   ?09/2012 FEV1 67%, no BD response ?  ?Spirometry 2015 with FEV1 59%, ratio 61  ? ? ?Past Surgical History:  ?Procedure Laterality Date  ? ANTERIOR CERVICAL DECOMP/DISCECTOMY FUSION  01/04/2012  ? Procedure: ANTERIOR CERVICAL DECOMPRESSION/DISCECTOMY FUSION 1 LEVEL/HARDWARE REMOVAL;  Surgeon: Ophelia Charter, MD;  Location: [facility];  Service: Neurosurgery;  Laterality: N/A;  explore cervical fusion Cervical six - seven  with removal of codman plate anterior cervical decompression with fusion interbody prothesis plating and bonegraft  ? APPENDECTOMY  2-74yr ago  ? ATRIAL FIBRILLATION ABLATION N/A 09/11/2012  ? PT DID NOT HAVE AN ATRIAL FIBRILLATION ABLATION IN 2014!   ATRIAL FLUTTER ABLATION ONLY  ? ATRIAL FLUTTER ABLATION  09/11/2012  ? CTI ablation by Dr ARayann Heman ? BIOPSY  11/07/2019  ? Procedure: BIOPSY;  Surgeon: JMilus Banister MD;  [facility];  Service: Endoscopy;;  ? CARDIAC CATHETERIZATION  2008  ? no significant CAD  ? CARDIOVERSION  05/05/2011  ? Procedure: CARDIOVERSION;  Surgeon: BLelon Perla MD;  [facility];  Service: Cardiovascular;  Laterality: N/A;  ? CARDIOVERSION Bilateral 07/26/2012  ? Procedure: CARDIOVERSION;  Surgeon: JMinus Breeding MD;  [facility];  Service: Cardiovascular;  Laterality: Bilateral;  ? CARPAL TUNNEL RELEASE  99/2000  ? bilateral  ? COLONOSCOPY WITH PROPOFOL N/A 12/13/2012  ? Procedure: COLONOSCOPY WITH PROPOFOL;  Surgeon: DMilus Banister MD;  [facility];  Service: Endoscopy;  Laterality: N/A;  ? ELECTROPHYSIOLOGIC STUDY N/A 04/21/2015  ? Procedure: Atrial Fibrillation Ablation;  Surgeon: JThompson Grayer MD;  [facility];  Service: Cardiovascular;  Laterality: N/A;  ? ESOPHAGOGASTRODUODENOSCOPY (EGD) WITH PROPOFOL N/A 11/07/2019  ? Procedure: ESOPHAGOGASTRODUODENOSCOPY (EGD) WITH PROPOFOL;  Surgeon: JMilus Banister MD;  [facility];  Service: Endoscopy;  Laterality: N/A;  ? HERNIA REPAIR    ? KNEE ARTHROSCOPY WITH MEDIAL MENISECTOMY Left 04/23/2021  ? Procedure: LEFT KNEE ARTHROSCOPY WITH PARTIAL MEDIAL MENISCECTOMY;  Surgeon: YMarybelle Killings MD;  [facility]  Service: Orthopedics;  Laterality: Left;  ?  KNEE SURGERY  6-44yr ago  ? left  ? LEFT HEART CATH AND CORONARY ANGIOGRAPHY N/A 09/19/2016  ? Procedure: Left Heart Cath and Coronary Angiography;  Surgeon: SBelva Crome MD;  [facility];  Service: Cardiovascular;  Laterality: N/A;  ? Left inguinal hernia repair    ? as a child  ? NASAL SEPTOPLASTY W/ TURBINOPLASTY Bilateral 02/19/2014  ? Procedure: NASAL SEPTOPLASTY WITH BILATERAL TURBINATE REDUCTION;  Surgeon: KJodi Marble MD;  [facility]  Service: ENT;  Laterality: Bilateral;  ? NECK SURGERY  1228yrago  ? right shoulder surgery  4-5y36yrgo  ? cyst removed  ? TEE WITHOUT CARDIOVERSION  05/05/2011  ? Procedure: TRANSESOPHAGEAL ECHOCARDIOGRAM (TEE);  Surgeon: BriLelon PerlaD;  [facility];  Laterality: N/A;  ? TEE WITHOUT CARDIOVERSION N/A 04/21/2015  ? Procedure: TRANSESOPHAGEAL ECHOCARDIOGRAM (TEE);  Surgeon: TraSueanne MargaritaD;  [facility];  Service: Cardiovascular;  Laterality: N/A;  ? THROAT SURGERY  4-28yr28yro  ? "thought " it was cancer but came back not  ? TONSILLECTOMY    ? ? ? ?Review of Systems ?neg for any significant sore throat, dysphagia, itching, sneezing, nasal congestion or excess/ purulent secretions, fever, chills, sweats, unintended wt loss, pleuritic or exertional cp, hempoptysis, orthopnea pnd or change in chronic leg swelling. Also denies presyncope, palpitations, heartburn, abdominal pain, nausea, vomiting, diarrhea or change in bowel or urinary habits, dysuria,hematuria, rash, arthralgias, visual complaints, headache, numbness weakness or ataxia. ? ?   ?Objective:  ? Physical Exam ? ?Gen. disheveled, in mild distress due to pain, anxious affect ?ENT - no pallor,icterus, no post nasal drip ?Neck: No JVD, no thyromegaly, no carotid bruits ?Lungs: no use of accessory muscles, no dullness to percussion, bilateral scattered ?Cardiovascular: Rhythm regular, heart sounds  normal, no murmurs or gallops, no peripheral edema ?Abdomen: soft and non-tender, no hepatosplenomegaly, BS normal. ?Musculoskeletal: No deformities, no cyanosis or clubbing, mild wasting of thenar eminence both arms ?Neuro:  alert, non focal , decreased reflexes upper extremity, power 4/5 all 4 extremities, reflexes present lower extremities, tremors ++ ? ? ? ?   ?Assessment & Plan:  ? ? ?

## 2021-08-05 NOTE — Assessment & Plan Note (Signed)
He has been evaluated by orthopedics for lumbar radiculopathy.  He is in severe pain. ?Exam today shows muscular weakness all 4 extremities with some wasting of his forearms and hands.  He also has worsening tremors with no evidence of cogwheel rigidity.  He has history of neck surgery in the past due to cervical disc disease. ?I will refer him to neurology.  He would likely need an MRI of his neck and further neurological evaluation ?

## 2021-08-05 NOTE — Assessment & Plan Note (Signed)
He is only using Anoro sporadically.  I encouraged him to use this on a daily basis ?Use albuterol as needed for rescue ?

## 2021-08-09 ENCOUNTER — Encounter: Payer: Self-pay | Admitting: Physician Assistant

## 2021-08-09 ENCOUNTER — Other Ambulatory Visit: Payer: Self-pay

## 2021-08-09 ENCOUNTER — Ambulatory Visit: Payer: Managed Care, Other (non HMO) | Admitting: Physician Assistant

## 2021-08-09 ENCOUNTER — Telehealth: Payer: Self-pay

## 2021-08-09 VITALS — BP 124/58 | HR 76 | Ht 74.0 in | Wt 248.2 lb

## 2021-08-09 DIAGNOSIS — E785 Hyperlipidemia, unspecified: Secondary | ICD-10-CM | POA: Diagnosis not present

## 2021-08-09 DIAGNOSIS — I480 Paroxysmal atrial fibrillation: Secondary | ICD-10-CM

## 2021-08-09 DIAGNOSIS — I10 Essential (primary) hypertension: Secondary | ICD-10-CM | POA: Diagnosis not present

## 2021-08-09 DIAGNOSIS — E119 Type 2 diabetes mellitus without complications: Secondary | ICD-10-CM

## 2021-08-09 DIAGNOSIS — R55 Syncope and collapse: Secondary | ICD-10-CM

## 2021-08-09 DIAGNOSIS — M79605 Pain in left leg: Secondary | ICD-10-CM

## 2021-08-09 DIAGNOSIS — M79604 Pain in right leg: Secondary | ICD-10-CM

## 2021-08-09 DIAGNOSIS — J449 Chronic obstructive pulmonary disease, unspecified: Secondary | ICD-10-CM

## 2021-08-09 NOTE — Patient Instructions (Signed)
Medication Instructions:  ?Your physician recommends that you continue on your current medications as directed. Please refer to the Current Medication list given to you today. ? ?*If you need a refill on your cardiac medications before your next appointment, please call your pharmacy* ? ?Lab Work: ?NONE ordered at this time of appointment  ? ?If you have labs (blood work) drawn today and your tests are completely normal, you will receive your results only by: ?MyChart Message (if you have MyChart) OR ?A paper copy in the mail ?If you have any lab test that is abnormal or we need to change your treatment, we will call you to review the results. ? ?Testing/Procedures: ?NONE ordered at this time of appointment  ? ?Follow-Up: ?At [facility], you and your health needs are our priority.  As part of our continuing mission to provide you with exceptional heart care, we have created designated Provider Care Teams.  These Care Teams include your primary Cardiologist (physician) and Advanced Practice Providers (APPs -  Physician Assistants and Nurse Practitioners) who all work together to provide you with the care you need, when you need it. ? ?Your next appointment:   ?6 month(s) ? ?The format for your next appointment:   ?In Person ? ?Provider:   ?[place] Office    ? ?Other Instructions ?Give Neurology office a call to set up appointment.  ? ?

## 2021-08-09 NOTE — Telephone Encounter (Signed)
Patient would like to switch from Dr. Percival Spanish to a provider at the [facility] office  ?

## 2021-08-09 NOTE — Progress Notes (Signed)
?Cardiology Office Note:   ? ?Date:  08/11/2021  ? ?ID:  [name], DOB [birth year], MRN [id number] ? ?PCP:  Pcp, No ?  ?[facility] Providers ?Cardiologist:  Minus Breeding, MD    ? ?Referring MD: No ref. provider found  ? ?Chief Complaint  ?Patient presents with  ? Follow-up  ? ? ?History of Present Illness:   ? ?[name] is a 66 y.o. male with a hx of COPD/emphysema, HTN, HLD, DM II, obesity, OSA intolerant of CPAP, history of tobacco abuse and history of atrial fibrillation and atrial flutter. Patient had atrial flutter ablation in 2014. Since he has both paroxysmal atrial fibrillation and atrial flutter, he underwent EP study with a second ablation on 04/21/2015. Cardiac catheterization in May 2018 showed widely patent coronary arteries with normal EF, moderate LVH. Echocardiogram obtained on 08/14/2019 showed EF 60 to 65%, moderate LVH, grade 2 DD, moderately elevated pulmonary artery systolic pressure, RVSP 47.4 mmHg, severe left atrial enlargement, moderate right atrial enlargement, mild MR, mild to moderate TR. Carotid Doppler obtained on 09/30/2019 showed 1 to 39% disease in bilateral internal carotid artery. Cardiac MRI performed on 12/16/2019 showed EF 66%, moderate concentric LVH, no myocardial LGE to suggest prior MI, infiltrative cardiac disease or myocarditis. No evidence of cardiac amyloidosis.  He unfortunately had a quite significant leg pain after cardiac MRI.  I saw the patient in September 2021, I felt his pain is most consistent with myositis versus compressed nerve in the lower back.  Total CK was negative which argues against myositis.  Lower extremity arterial Doppler obtained on 02/12/2020 showed normal ABI and triphasic blood flow throughout both legs, no vascular blockage was seen to explain his leg discomfort.   ?  ?He was last seen by Dr. Percival Spanish in August 2022 at which time he continue to have back pain and occasional palpitation.  I saw the patient on 03/10/2021, he again  complained of significant leg discomfort.  His lower extremity was tender to touch.  Venous Doppler obtained on 04/29/2021 was negative for DVT.  I last saw the patient in February 2013 with complaint of intermittent shortness of breath for 1 month and dizziness as well.  Symptom eventually resolved by itself, but recurred in the morning of 06/28/2021.  Blood work was normal.  I recommended a 2-week heart monitor.  As for his back pain, I recommend that he sees Dr. Louanne Skye whom he has seen in the past for back issue.  Repeat echocardiogram obtained on 07/09/2021 showed EF 65 to 70%, moderate LVH, RVSP 33.5 mmHg, trivial MR, moderate to severe mitral annular calcification, no evidence of mitral stenosis, mild to moderate TR, mild to moderate aortic stenosis.  Heart monitor was reassuring with occasional SVT and junctional rhythm but nothing to explain the previous presyncope. ? ?Patient presents today for follow-up.  I have reviewed the recent echocardiogram and heart monitor results with [name].  He continues to have significant lower back and leg pain.  Some this could be related to spinal issue.  However he says Dr. Louanne Skye told him that he cannot operate on him due to her cardiac issue.  However I took a look at his previous cath report and echocardiogram, there is no limitation from the cardiac perspective.  I suspected Dr. Louanne Skye was worried about his pulmonary condition.  He was seen by Dr. Elsworth Soho a few days ago and referred to neurology service.  He has significant muscle loss since last year and can barely  get around.  I think is quite reasonable for him to see neurology service.  Otherwise, I recommend 29-monthfollow-up with cardiology service.  Recent work-up was reassuring, I would not recommend any further work-up at this time. ? ?Past Medical History:  ?Diagnosis Date  ? Arthritis   ? lower back  ? Asthma   ? Atrial fibrillation ([place]   ? Atrial flutter ([place]   ? s/p CTI ablation by Dr ARayann Heman ? Brain  aneurysm 2009  ? questionable. A follow up CTA in 2009 showed no evidence of  ? Chronic back pain   ? DDD/stenosis  ? Colon polyps   ? 9 polyps removed 10/13/11  ? Complication of anesthesia 09/11/2012  ? slow to awaken after ablation  ? COPD (chronic obstructive pulmonary disease) ([place]   ? Diabetes mellitus   ? takes Metformin and Glimepiride daily  ? Emphysema   ? GERD (gastroesophageal reflux disease)   ? takes Omeprazole bid  ? Heart failure ([place]   ? History of shingles   ? HLD (hyperlipidemia)   ? takes Pravastatin daily  ? HTN (hypertension)   ? takes Prinizide daily  ? Obesity   ? OSA (obstructive sleep apnea)   ? not always using cpap  ? Overdose 2009  ? unintentional Flecanide overdose  ? Peripheral neuropathy   ? Short-term memory loss   ? Tobacco abuse   ? ? ?Past Surgical History:  ?Procedure Laterality Date  ? ANTERIOR CERVICAL DECOMP/DISCECTOMY FUSION  01/04/2012  ? Procedure: ANTERIOR CERVICAL DECOMPRESSION/DISCECTOMY FUSION 1 LEVEL/HARDWARE REMOVAL;  Surgeon: JOphelia Charter MD;  [facility];  Service: Neurosurgery;  Laterality: N/A;  explore cervical fusion Cervical six - seven  with removal of codman plate anterior cervical decompression with fusion interbody prothesis plating and bonegraft  ? APPENDECTOMY  2-321yrago  ? ATRIAL FIBRILLATION ABLATION N/A 09/11/2012  ? PT DID NOT HAVE AN ATRIAL FIBRILLATION ABLATION IN 2014!  ATRIAL FLUTTER ABLATION ONLY  ? ATRIAL FLUTTER ABLATION  09/11/2012  ? CTI ablation by Dr AlRayann Heman? BIOPSY  11/07/2019  ? Procedure: BIOPSY;  Surgeon: JaMilus BanisterMD;  [facility];  Service: Endoscopy;;  ? CARDIAC CATHETERIZATION  2008  ? no significant CAD  ? CARDIOVERSION  05/05/2011  ? Procedure: CARDIOVERSION;  Surgeon: BrLelon PerlaMD;  [facility];  Service: Cardiovascular;  Laterality: N/A;  ? CARDIOVERSION Bilateral 07/26/2012  ? Procedure: CARDIOVERSION;  Surgeon: JaMinus BreedingMD;  [facility];  Service:  Cardiovascular;  Laterality: Bilateral;  ? CARPAL TUNNEL RELEASE  99/2000  ? bilateral  ? COLONOSCOPY WITH PROPOFOL N/A 12/13/2012  ? Procedure: COLONOSCOPY WITH PROPOFOL;  Surgeon: DaMilus BanisterMD;  [facility];  Service: Endoscopy;  Laterality: N/A;  ? ELECTROPHYSIOLOGIC STUDY N/A 04/21/2015  ? Procedure: Atrial Fibrillation Ablation;  Surgeon: JaThompson GrayerMD;  [facility];  Service: Cardiovascular;  Laterality: N/A;  ? ESOPHAGOGASTRODUODENOSCOPY (EGD) WITH PROPOFOL N/A 11/07/2019  ? Procedure: ESOPHAGOGASTRODUODENOSCOPY (EGD) WITH PROPOFOL;  Surgeon: JaMilus BanisterMD;  [facility];  Service: Endoscopy;  Laterality: N/A;  ? HERNIA REPAIR    ? KNEE ARTHROSCOPY WITH MEDIAL MENISECTOMY Left 04/23/2021  ? Procedure: LEFT KNEE ARTHROSCOPY WITH PARTIAL MEDIAL MENISCECTOMY;  Surgeon: YaMarybelle KillingsMD;  [facility] Service: Orthopedics;  Laterality: Left;  ? KNEE SURGERY  6-7y26yrgo  ? left  ? LEFT HEART CATH AND CORONARY ANGIOGRAPHY N/A 09/19/2016  ? Procedure: Left  Heart Cath and Coronary Angiography;  Surgeon: Belva Crome, MD;  [facility]: [facility];  Service: Cardiovascular;  Laterality: N/A;  ? Left inguinal hernia repair    ? as a child  ? NASAL SEPTOPLASTY W/ TURBINOPLASTY Bilateral 02/19/2014  ? Procedure: NASAL SEPTOPLASTY WITH BILATERAL TURBINATE REDUCTION;  Surgeon: Jodi Marble, MD;  Location: [place];  Service: ENT;  Laterality: Bilateral;  ? NECK SURGERY  34yr ago  ? right shoulder surgery  4-549yrago  ? cyst removed  ? TEE WITHOUT CARDIOVERSION  05/05/2011  ? Procedure: TRANSESOPHAGEAL ECHOCARDIOGRAM (TEE);  Surgeon: BrLelon PerlaMD;  [facility] Service: Cardiovascular;  Laterality: N/A;  ? TEE WITHOUT CARDIOVERSION N/A 04/21/2015  ? Procedure: TRANSESOPHAGEAL ECHOCARDIOGRAM (TEE);  Surgeon: TrSueanne MargaritaMD;  [facility];  Service: Cardiovascular;  Laterality: N/A;  ? THROAT SURGERY  4-5y54yrgo  ? "thought " it was  cancer but came back not  ? TONSILLECTOMY    ? ? ?Current Medications: ?Current Meds  ?Medication Sig  ? albuterol (PROAIR HFA) 108 (90 Base) MCG/ACT inhaler Inhale 1-2 puffs into the lungs every 6 (six) hours as needed fo

## 2021-08-10 ENCOUNTER — Other Ambulatory Visit: Payer: Self-pay | Admitting: Endocrinology

## 2021-08-10 ENCOUNTER — Telehealth: Payer: Self-pay

## 2021-08-10 MED ORDER — HUMALOG KWIKPEN 100 UNIT/ML ~~LOC~~ SOPN: 0.0000 [IU] | PEN_INJECTOR | Freq: Three times a day (TID) | SUBCUTANEOUS | 3 refills | Status: DC

## 2021-08-10 NOTE — Telephone Encounter (Signed)
Called and unable to leave VM.

## 2021-08-10 NOTE — Telephone Encounter (Signed)
Patient called and left a voicemail stating he is out of insulin and says that he isnt prescribed enough insulin for what he is taking and almost out. Humalog prescribed is to Inject 0-20 Units into the skin 3 (three) times daily with meals. Please advise ?

## 2021-08-11 NOTE — Telephone Encounter (Signed)
Attempted to call patient to inform him of new rx being sent in to reflect adjustments. LVM for a call back if needed. ?

## 2021-08-12 ENCOUNTER — Encounter: Payer: Self-pay | Admitting: Nurse Practitioner

## 2021-08-12 ENCOUNTER — Ambulatory Visit (INDEPENDENT_AMBULATORY_CARE_PROVIDER_SITE_OTHER): Payer: Managed Care, Other (non HMO) | Admitting: Nurse Practitioner

## 2021-08-12 VITALS — BP 113/48 | HR 74 | Temp 97.8°F | Ht 74.02 in | Wt 249.4 lb

## 2021-08-12 DIAGNOSIS — Z6832 Body mass index (BMI) 32.0-32.9, adult: Secondary | ICD-10-CM

## 2021-08-12 DIAGNOSIS — G894 Chronic pain syndrome: Secondary | ICD-10-CM | POA: Diagnosis not present

## 2021-08-12 DIAGNOSIS — Z0001 Encounter for general adult medical examination with abnormal findings: Secondary | ICD-10-CM | POA: Diagnosis not present

## 2021-08-12 DIAGNOSIS — M064 Inflammatory polyarthropathy: Secondary | ICD-10-CM

## 2021-08-12 DIAGNOSIS — R5383 Other fatigue: Secondary | ICD-10-CM

## 2021-08-12 DIAGNOSIS — F17209 Nicotine dependence, unspecified, with unspecified nicotine-induced disorders: Secondary | ICD-10-CM

## 2021-08-12 DIAGNOSIS — M6259 Muscle wasting and atrophy, not elsewhere classified, multiple sites: Secondary | ICD-10-CM | POA: Diagnosis not present

## 2021-08-12 DIAGNOSIS — F321 Major depressive disorder, single episode, moderate: Secondary | ICD-10-CM | POA: Insufficient documentation

## 2021-08-12 MED ORDER — DULOXETINE HCL 20 MG PO CPEP: 20.0000 mg | ORAL_CAPSULE | Freq: Every day | ORAL | 1 refills | Status: DC

## 2021-08-12 NOTE — Progress Notes (Signed)
Established patient visit ? ? ?Patient: [name]   DOB: 1956-05-04   66 y.o. Male  MRN: [id number] ?Visit Date: 08/12/2021 ? ? ?Chief Complaint  ?Patient presents with  ? Annual Exam  ? ?Subjective  ?  ?HPI  ?The patient is here for annual wellness visit.  ?-having trouble with muscle loss. Increased weakness. Feels like his muscles are going to give out when he walks just a short distance.  This started about two years ago after he got the second vaccine for covid 19. He states that his pulmonologist has recommended he see neurologist.  ?-needs referral to pain management  has been on pain medication for years from previous PCP who is now retiring. Patient has seen orthopedic provider. ANA and sed rate have been recently positive . ?-patient is depressed. Feels hopeless.  ?-he has history of COPD - sees pulmonology  ?-he has heart concerns - sees cardiology   ? ? ? ?Medications: ?Outpatient Medications Prior to Visit  ?Medication Sig  ? albuterol (PROAIR HFA) 108 (90 Base) MCG/ACT inhaler Inhale 1-2 puffs into the lungs every 6 (six) hours as needed for wheezing or shortness of breath.  ? b complex vitamins tablet Take 1 tablet by mouth daily.   ? bisoprolol (ZEBETA) 10 MG tablet Take 10 mg by mouth daily.   ? Continuous Blood Gluc Receiver ([place]) DEVI   ? Continuous Blood Gluc Sensor (DEXCOM G6 SENSOR) MISC 1 Device by Does not apply route See admin instructions. Change every 10 days  ? Continuous Blood Gluc Transmit (DEXCOM G6 TRANSMITTER) MISC USE AS DIRECTED  ? diltiazem (CARDIZEM CD) 120 MG 24 hr capsule Take 120 mg by mouth daily.  ? Dulaglutide (TRULICITY) 3 JI/9.6VE SOPN Inject 3 mg as directed once a week.  ? EASY COMFORT PEN NEEDLES 31G X 5 MM MISC USE 3 TIMES DAILY  ? gabapentin (NEURONTIN) 300 MG capsule Take 300 mg by mouth See admin instructions. Take 1 capsule (300 mg) by mouth in the morning & take 3 capsules (900 mg) by mouth at night  ? HUMALOG KWIKPEN 100 UNIT/ML KwikPen Inject  0-20 Units into the skin 3 (three) times daily with meals.  ? losartan (COZAAR) 50 MG tablet Take 1 tablet (50 mg total) by mouth daily.  ? metolazone (ZAROXOLYN) 2.5 MG tablet TAKE 1 TABLET BY MOUTH ON TUESDAY AND THURSDAY 30 MINUTES BEFORE TAKING TORSEMIDE OR AS DIRECTED  ? Multiple Vitamin (MULTIVITAMIN WITH MINERALS) TABS Take 1 tablet by mouth daily.   ? Multiple Vitamins-Minerals (ZINC PO) Take 25 mg by mouth daily.  ? nitroGLYCERIN (NITROSTAT) 0.4 MG SL tablet Place 1 tablet (0.4 mg total) under the tongue every 5 (five) minutes x 3 doses as needed for chest pain.  ? omeprazole (PRILOSEC) 20 MG capsule Take 1 capsule (20 mg total) by mouth 2 (two) times daily.  ? oxyCODONE-acetaminophen (PERCOCET) 10-325 MG tablet Take 1.5 tablets by mouth in the morning, at noon, in the evening, and at bedtime.  ? potassium chloride SA (KLOR-CON) 20 MEQ tablet TAKE 1 TABLET BY MOUTH DAILY. TAKE AN EXTRA TABLET ON TUESDAY AND THURSDAY (Patient taking differently: 20 mEq See admin instructions. Take 1 tablet (20 meq) by mouth (scheduled) at night on Sundays, Mondays, Wednesdays, Fridays & Saturdays & take 2 tablets (40 meq) by mouth at night on Tuesdays & Thursdays)  ? pravastatin (PRAVACHOL) 80 MG tablet TAKE 1 TABLET BY MOUTH DAILY AT BEDTIME  ? tamsulosin (FLOMAX) 0.4 MG CAPS capsule  TAKE 1 CAPSULE BY MOUTH EACH NIGHT AT BEDTIME  ? torsemide (DEMADEX) 20 MG tablet Take 2 tablets in the morning and 1 tablet in the PM  ? triamcinolone cream (KENALOG) 0.1 % Apply 1 application topically daily as needed.  ? umeclidinium-vilanterol (ANORO ELLIPTA) 62.5-25 MCG/INH AEPB Inhale 1 puff into the lungs daily. (Patient taking differently: Inhale 1 puff into the lungs daily as needed (respiratory issues.).)  ? VITAMIN A PO Take 2,400 mcg by mouth daily.   ? Vitamin D, Ergocalciferol, (DRISDOL) 1.25 MG (50000 UT) CAPS capsule Take 50,000 Units by mouth every Friday.  ? warfarin (COUMADIN) 5 MG tablet TAKE 1 TO 1 AND 1/2 TABLETS BY MOUTH  DAILY AS DIRECTED BY THE COUMADIN CLINIC  ? cyclobenzaprine (FLEXERIL) 10 MG tablet Take 10 mg by mouth 3 (three) times daily.  ? ?No facility-administered medications prior to visit.  ? ? ?Review of Systems  ?Constitutional:  Positive for fatigue. Negative for activity change, chills and fever.  ?HENT:  Negative for congestion, postnasal drip, rhinorrhea, sinus pressure, sinus pain, sneezing and sore throat.   ?Eyes: Negative.   ?Respiratory:  Positive for cough and shortness of breath. Negative for wheezing.   ?Cardiovascular:  Negative for chest pain and palpitations.  ?Gastrointestinal:  Negative for constipation, diarrhea, nausea and vomiting.  ?Endocrine: Negative for cold intolerance, heat intolerance, polydipsia and polyuria.  ?     The patient has insulin dependant type 2 diabetes. Sees endocrinology for this   ?Genitourinary:  Negative for dysuria, frequency and urgency.  ?Musculoskeletal:  Positive for arthralgias, back pain, gait problem and myalgias.  ?Skin:  Negative for rash.  ?Allergic/Immunologic: Negative for environmental allergies.  ?Neurological:  Negative for dizziness, weakness and headaches.  ?Psychiatric/Behavioral:  Positive for dysphoric mood. The patient is not nervous/anxious.   ? ? ? ? Objective  ? ? ?Today's Vitals  ? 08/12/21 1016  ?BP: (!) 113/48  ?Pulse: 74  ?Temp: 97.8 ?F (36.6 ?C)  ?SpO2: 94%  ?Weight: 249 lb 6.4 oz (113.1 kg)  ?Height: 6' 2.02" (1.88 m)  ? ?Body mass index is 32.01 kg/m?.  ? ?BP Readings from Last 3 Encounters:  ?08/12/21 (!) 113/48  ?08/09/21 (!) 124/58  ?08/05/21 136/72  ?  ?Wt Readings from Last 3 Encounters:  ?08/12/21 249 lb 6.4 oz (113.1 kg)  ?08/09/21 248 lb 3.2 oz (112.6 kg)  ?08/05/21 245 lb (111.1 kg)  ?  ?Physical Exam ?Vitals and nursing note reviewed.  ?Constitutional:   ?   Appearance: Normal appearance. He is well-developed. He is ill-appearing.  ?HENT:  ?   Head: Normocephalic and atraumatic.  ?Eyes:  ?   Pupils: Pupils are equal, round, and  reactive to light.  ?Neck:  ?   Vascular: Carotid bruit present.  ?   Comments: Soft, blowing carotid bruit, bilaterally.  ?Cardiovascular:  ?   Rate and Rhythm: Normal rate and regular rhythm.  ?   Pulses: Normal pulses.  ?   Heart sounds: Murmur heard.  ?Pulmonary:  ?   Effort: Pulmonary effort is normal.  ?   Breath sounds: Wheezing and rhonchi present.  ?   Comments: Loose, congested vough auscultated  ?Abdominal:  ?   General: Bowel sounds are normal. There is no distension.  ?   Palpations: Abdomen is soft.  ?   Tenderness: There is no abdominal tenderness. There is no guarding or rebound.  ?   Hernia: No hernia is present.  ?Musculoskeletal:     ?  General: Normal range of motion.  ?   Cervical back: Normal range of motion and neck supple.  ?Lymphadenopathy:  ?   Cervical: No cervical adenopathy.  ?Skin: ?   General: Skin is warm and dry.  ?   Capillary Refill: Capillary refill takes less than 2 seconds.  ?Neurological:  ?   General: No focal deficit present.  ?   Mental Status: He is alert and oriented to person, place, and time.  ?Psychiatric:     ?   Attention and Perception: Attention and perception normal.     ?   Mood and Affect: Affect normal. Mood is depressed.     ?   Speech: Speech normal.     ?   Behavior: Behavior normal. Behavior is cooperative.     ?   Thought Content: Thought content normal.     ?   Cognition and Memory: Cognition and memory normal.     ?   Judgment: Judgment normal.  ?  ? ? Assessment & Plan  ?  ?1. Encounter for general adult medical examination with abnormal findings ?Annual wellness visit  ? ?2. Atrophy of muscle of multiple sites ?Unclear etiology. Patient has had positive ANA and sed rate in the past. Has not had further work up. Check connective tissue panel today. Consider referral to rheumatology as indicated.  ?- CBC; Future ?- Comp Met (CMET); Future ?- Rheumatoid Factor; Future ?- ANA w/Reflex if Positive; Future ?- Sedimentation rate; Future ?- Sedimentation  rate ?- ANA w/Reflex if Positive ?- Rheumatoid Factor ?- Comp Met (CMET) ?- CBC ? ?3. Inflammatory polyarthropathy ([place]) ?Check connective tissue panel today and refer to rheumatology as indicated  ?- Rheumatoi

## 2021-08-13 LAB — COMPREHENSIVE METABOLIC PANEL
ALT: 12 IU/L (ref 0–44)
AST: 23 IU/L (ref 0–40)
Albumin/Globulin Ratio: 1.4 (ref 1.2–2.2)
Albumin: 4 g/dL (ref 3.8–4.8)
Alkaline Phosphatase: 146 IU/L — ABNORMAL HIGH (ref 44–121)
BUN/Creatinine Ratio: 14 (ref 10–24)
BUN: 18 mg/dL (ref 8–27)
Bilirubin Total: 0.4 mg/dL (ref 0.0–1.2)
CO2: 29 mmol/L (ref 20–29)
Calcium: 8.9 mg/dL (ref 8.6–10.2)
Chloride: 98 mmol/L (ref 96–106)
Creatinine, Ser: 1.26 mg/dL (ref 0.76–1.27)
Globulin, Total: 2.8 g/dL (ref 1.5–4.5)
Glucose: 295 mg/dL — ABNORMAL HIGH (ref 70–99)
Potassium: 3.5 mmol/L (ref 3.5–5.2)
Sodium: 142 mmol/L (ref 134–144)
Total Protein: 6.8 g/dL (ref 6.0–8.5)
eGFR: 64 mL/min/{1.73_m2} (ref >59)

## 2021-08-13 LAB — SEDIMENTATION RATE: Sed Rate: 39 mm/hr — ABNORMAL HIGH (ref 0–30)

## 2021-08-13 LAB — CBC
Hematocrit: 32.9 % — ABNORMAL LOW (ref 37.5–51.0)
Hemoglobin: 11 g/dL — ABNORMAL LOW (ref 13.0–17.7)
MCH: 30.6 pg (ref 26.6–33.0)
MCHC: 33.4 g/dL (ref 31.5–35.7)
MCV: 92 fL (ref 79–97)
Platelets: 125 10*3/uL — ABNORMAL LOW (ref 150–450)
RBC: 3.59 x10E6/uL — ABNORMAL LOW (ref 4.14–5.80)
RDW: 16.8 % — ABNORMAL HIGH (ref 11.6–15.4)
WBC: 5.4 10*3/uL (ref 3.4–10.8)

## 2021-08-13 LAB — T4, FREE: Free T4: 1.33 ng/dL (ref 0.82–1.77)

## 2021-08-13 LAB — ANA W/REFLEX IF POSITIVE: Anti Nuclear Antibody (ANA): NEGATIVE

## 2021-08-13 LAB — TSH: TSH: 3.16 u[IU]/mL (ref 0.450–4.500)

## 2021-08-13 LAB — RHEUMATOID FACTOR: Rheumatoid fact SerPl-aCnc: <10 IU/mL (ref <14.0)

## 2021-08-13 NOTE — Progress Notes (Signed)
Mild anemia. Low platelets. Recheck next visit. Consider referral to hematology. Waiting on all results.

## 2021-08-16 ENCOUNTER — Encounter: Payer: Self-pay | Admitting: Nurse Practitioner

## 2021-08-18 ENCOUNTER — Other Ambulatory Visit: Payer: Self-pay | Admitting: Nurse Practitioner

## 2021-09-06 ENCOUNTER — Ambulatory Visit (INDEPENDENT_AMBULATORY_CARE_PROVIDER_SITE_OTHER): Payer: Managed Care, Other (non HMO)

## 2021-09-06 DIAGNOSIS — Z5181 Encounter for therapeutic drug level monitoring: Secondary | ICD-10-CM

## 2021-09-06 DIAGNOSIS — I480 Paroxysmal atrial fibrillation: Secondary | ICD-10-CM

## 2021-09-06 DIAGNOSIS — Z7901 Long term (current) use of anticoagulants: Secondary | ICD-10-CM

## 2021-09-06 LAB — POCT INR: INR: 2.6 (ref 2.0–3.0)

## 2021-09-06 NOTE — Patient Instructions (Signed)
continue taking 1 tablet daily except for 1/2 tablet every Monday, Wednesday and Friday. Repeat INR  in 6 weeks. ?

## 2021-09-09 ENCOUNTER — Encounter: Payer: Self-pay | Admitting: Nurse Practitioner

## 2021-09-09 ENCOUNTER — Ambulatory Visit (INDEPENDENT_AMBULATORY_CARE_PROVIDER_SITE_OTHER): Payer: Managed Care, Other (non HMO) | Admitting: Nurse Practitioner

## 2021-09-09 VITALS — BP 137/73 | HR 69 | Temp 98.1°F | Ht 74.02 in | Wt 248.1 lb

## 2021-09-09 DIAGNOSIS — Z6831 Body mass index (BMI) 31.0-31.9, adult: Secondary | ICD-10-CM | POA: Diagnosis not present

## 2021-09-09 DIAGNOSIS — I5033 Acute on chronic diastolic (congestive) heart failure: Secondary | ICD-10-CM

## 2021-09-09 DIAGNOSIS — E119 Type 2 diabetes mellitus without complications: Secondary | ICD-10-CM

## 2021-09-09 DIAGNOSIS — M6259 Muscle wasting and atrophy, not elsewhere classified, multiple sites: Secondary | ICD-10-CM

## 2021-09-09 DIAGNOSIS — Z794 Long term (current) use of insulin: Secondary | ICD-10-CM

## 2021-09-09 DIAGNOSIS — G894 Chronic pain syndrome: Secondary | ICD-10-CM | POA: Diagnosis not present

## 2021-09-09 MED ORDER — EASY COMFORT PEN NEEDLES 31G X 5 MM MISC: 3 refills | Status: DC

## 2021-09-09 MED ORDER — HUMALOG KWIKPEN 100 UNIT/ML ~~LOC~~ SOPN: 0.0000 [IU] | PEN_INJECTOR | Freq: Three times a day (TID) | SUBCUTANEOUS | 3 refills | Status: DC

## 2021-09-09 NOTE — Progress Notes (Signed)
Established patient visit ? ? ?Patient: [name]   DOB: [birth year]   66 y.o. Male  MRN: [id number] ?Visit Date: 09/09/2021 ? ? ?Chief Complaint  ?Patient presents with  ? Follow-up  ? ?Subjective  ?  ?HPI  ?Patient presents for follow up visit.  ?-recent fasting labs done along with connective tissue panel.  ?--HgbA1c 7.6 with blood sugar 295. He is currently taking trulicity 66m weekly. He does take as needed Humalog for mealtime coverage. He does have a DexCom continuous glucose monitor. He states that monitor is only good for a few days on him then has to replace it. Doesn't always correlate with finger stick sugars. Patient's current prescription for regular insulin is to dose 0-20 units up to three times daily, however, the prescription has been up to 60 units three times daily.  ?-mildly anemic with low platelet count  ?-negative ANA and RA with positive sed rate  ?-chronic pain. Referred to pain management at his most recent visit. Referral approved, however patient has not heard about scheduling. He is on fairly high dose of oxycodone and I am not comfortable prescribing the dose of medications he is taking. He states that he is having muscle wasting due to lack of use due to pain. Has significant neuropathy.  ?-started duloxetine at his most recent visit. States that this medication made him feel groggy and even less productive than he already is. He stopped taking this and doesn't want to continue taking it. ?-sees cardiology and pulmonology on regular basis  ? ? ?Medications: ?Outpatient Medications Prior to Visit  ?Medication Sig  ? albuterol (PROAIR HFA) 108 (90 Base) MCG/ACT inhaler Inhale 1-2 puffs into the lungs every 6 (six) hours as needed for wheezing or shortness of breath.  ? b complex vitamins tablet Take 1 tablet by mouth daily.   ? bisoprolol (ZEBETA) 10 MG tablet Take 10 mg by mouth daily.   ? Continuous Blood Gluc Receiver (DLowell DEVI   ? Continuous Blood Gluc Sensor  (DEXCOM G6 SENSOR) MISC 1 Device by Does not apply route See admin instructions. Change every 10 days  ? Continuous Blood Gluc Transmit (DEXCOM G6 TRANSMITTER) MISC USE AS DIRECTED  ? cyclobenzaprine (FLEXERIL) 10 MG tablet Take 10 mg by mouth 3 (three) times daily.  ? diltiazem (CARDIZEM CD) 120 MG 24 hr capsule Take 120 mg by mouth daily.  ? Dulaglutide (TRULICITY) 3 MFY/1.0FBSOPN Inject 3 mg as directed once a week.  ? DULoxetine (CYMBALTA) 20 MG capsule Take 1 capsule (20 mg total) by mouth daily.  ? gabapentin (NEURONTIN) 300 MG capsule Take 300 mg by mouth See admin instructions. Take 1 capsule (300 mg) by mouth in the morning & take 3 capsules (900 mg) by mouth at night  ? losartan (COZAAR) 50 MG tablet TAKE 1 TABLET BY MOUTH DAILY  ? metolazone (ZAROXOLYN) 2.5 MG tablet TAKE 1 TABLET BY MOUTH ON TUESDAY AND THURSDAY 30 MINUTES BEFORE TAKING TORSEMIDE OR AS DIRECTED  ? Multiple Vitamin (MULTIVITAMIN WITH MINERALS) TABS Take 1 tablet by mouth daily.   ? Multiple Vitamins-Minerals (ZINC PO) Take 25 mg by mouth daily.  ? nitroGLYCERIN (NITROSTAT) 0.4 MG SL tablet Place 1 tablet (0.4 mg total) under the tongue every 5 (five) minutes x 3 doses as needed for chest pain.  ? omeprazole (PRILOSEC) 20 MG capsule Take 1 capsule (20 mg total) by mouth 2 (two) times daily.  ? oxyCODONE-acetaminophen (PERCOCET) 10-325 MG tablet Take 1.5 tablets by mouth  in the morning, at noon, in the evening, and at bedtime.  ? potassium chloride SA (KLOR-CON) 20 MEQ tablet TAKE 1 TABLET BY MOUTH DAILY. TAKE AN EXTRA TABLET ON TUESDAY AND THURSDAY (Patient taking differently: 20 mEq See admin instructions. Take 1 tablet (20 meq) by mouth (scheduled) at night on Sundays, Mondays, Wednesdays, Fridays & Saturdays & take 2 tablets (40 meq) by mouth at night on Tuesdays & Thursdays)  ? pravastatin (PRAVACHOL) 80 MG tablet TAKE 1 TABLET BY MOUTH DAILY AT BEDTIME  ? tamsulosin (FLOMAX) 0.4 MG CAPS capsule TAKE 1 CAPSULE BY MOUTH EACH NIGHT AT  BEDTIME  ? torsemide (DEMADEX) 20 MG tablet Take 2 tablets in the morning and 1 tablet in the PM  ? triamcinolone cream (KENALOG) 0.1 % Apply 1 application topically daily as needed.  ? umeclidinium-vilanterol (ANORO ELLIPTA) 62.5-25 MCG/INH AEPB Inhale 1 puff into the lungs daily. (Patient taking differently: Inhale 1 puff into the lungs daily as needed (respiratory issues.).)  ? VITAMIN A PO Take 2,400 mcg by mouth daily.   ? Vitamin D, Ergocalciferol, (DRISDOL) 1.25 MG (50000 UT) CAPS capsule Take 50,000 Units by mouth every Friday.  ? warfarin (COUMADIN) 5 MG tablet TAKE 1 TO 1 AND 1/2 TABLETS BY MOUTH DAILY AS DIRECTED BY THE COUMADIN CLINIC  ? [DISCONTINUED] EASY COMFORT PEN NEEDLES 31G X 5 MM MISC USE 3 TIMES DAILY  ? [DISCONTINUED] HUMALOG KWIKPEN 100 UNIT/ML KwikPen Inject 0-20 Units into the skin 3 (three) times daily with meals.  ? ?No facility-administered medications prior to visit.  ? ? ?Review of Systems  ?Constitutional:  Positive for activity change and fatigue. Negative for chills and fever.  ?     States that activity level continues to decline due to muscle and joint pain   ?HENT:  Negative for congestion, postnasal drip, rhinorrhea, sinus pressure, sinus pain, sneezing and sore throat.   ?Eyes: Negative.   ?Respiratory:  Positive for shortness of breath and wheezing. Negative for cough.   ?Cardiovascular:  Negative for chest pain and palpitations.  ?Gastrointestinal:  Negative for constipation, diarrhea, nausea and vomiting.  ?Endocrine: Negative for cold intolerance, heat intolerance, polydipsia and polyuria.  ?     Poor control of insulin dependant type 2 diabetes   ?Genitourinary:  Negative for dysuria, frequency and urgency.  ?Musculoskeletal:  Positive for arthralgias, gait problem, joint swelling, myalgias, neck pain and neck stiffness. Negative for back pain.  ?Skin:  Negative for rash.  ?Allergic/Immunologic: Negative for environmental allergies.  ?Neurological:  Positive for weakness.  Negative for dizziness and headaches.  ?Psychiatric/Behavioral:  The patient is not nervous/anxious.   ? ?Last CBC ?Lab Results  ?Component Value Date  ? WBC 5.4 08/12/2021  ? HGB 11.0 (L) 08/12/2021  ? HCT 32.9 (L) 08/12/2021  ? MCV 92 08/12/2021  ? MCH 30.6 08/12/2021  ? RDW 16.8 (H) 08/12/2021  ? PLT 125 (L) 08/12/2021  ? ?Last metabolic panel ?Lab Results  ?Component Value Date  ? GLUCOSE 295 (H) 08/12/2021  ? NA 142 08/12/2021  ? K 3.5 08/12/2021  ? CL 98 08/12/2021  ? CO2 29 08/12/2021  ? BUN 18 08/12/2021  ? CREATININE 1.26 08/12/2021  ? EGFR 64 08/12/2021  ? CALCIUM 8.9 08/12/2021  ? PROT 6.8 08/12/2021  ? ALBUMIN 4.0 08/12/2021  ? LABGLOB 2.8 08/12/2021  ? AGRATIO 1.4 08/12/2021  ? BILITOT 0.4 08/12/2021  ? ALKPHOS 146 (H) 08/12/2021  ? AST 23 08/12/2021  ? ALT 12 08/12/2021  ? ANIONGAP 12  04/23/2021  ? ?Last lipids ?Lab Results  ?Component Value Date  ? CHOL 122 08/18/2017  ? HDL 29 (L) 08/18/2017  ? [place] 13 08/18/2017  ? LDLDIRECT 99.9 09/08/2011  ? TRIG 400 (H) 08/18/2017  ? CHOLHDL 4.2 08/18/2017  ? ?Last hemoglobin A1c ?Lab Results  ?Component Value Date  ? HGBA1C 7.6 (A) 07/29/2021  ? ?Last thyroid functions ?Lab Results  ?Component Value Date  ? TSH 3.160 08/12/2021  ? ? ?  ? ? Objective  ?  ? ?Today's Vitals  ? 09/09/21 0933  ?BP: 137/73  ?Pulse: 69  ?Temp: 98.1 ?F (36.7 ?C)  ?SpO2: 95%  ?Weight: 248 lb 1.9 oz (112.5 kg)  ?Height: 6' 2.02" (1.88 m)  ? ?Body mass index is 31.84 kg/m?.  ? ?BP Readings from Last 3 Encounters:  ?09/09/21 137/73  ?08/12/21 (!) 113/48  ?08/09/21 (!) 124/58  ?  ?Wt Readings from Last 3 Encounters:  ?09/09/21 248 lb 1.9 oz (112.5 kg)  ?08/12/21 249 lb 6.4 oz (113.1 kg)  ?08/09/21 248 lb 3.2 oz (112.6 kg)  ?  ?Physical Exam ?Vitals and nursing note reviewed.  ?Constitutional:   ?   Appearance: Normal appearance. He is well-developed.  ?HENT:  ?   Head: Normocephalic and atraumatic.  ?Eyes:  ?   Pupils: Pupils are equal, round, and reactive to light.  ?Cardiovascular:  ?    Rate and Rhythm: Normal rate. Rhythm irregular.  ?   Pulses: Normal pulses.  ?   Heart sounds: Normal heart sounds.  ?Pulmonary:  ?   Effort: Pulmonary effort is normal.  ?   Breath sounds: Rhonchi present.

## 2021-09-14 ENCOUNTER — Ambulatory Visit: Payer: Managed Care, Other (non HMO) | Admitting: Endocrinology

## 2021-09-15 ENCOUNTER — Telehealth: Payer: Self-pay | Admitting: Nurse Practitioner

## 2021-09-15 NOTE — Telephone Encounter (Signed)
Advised patient we were unable to provide narcotic prescription and suggested he call around to different pain management facilities to see if he can find one that will see him sooner. Patient states he found one in [place] that would see him sooner but it will cost him $250 and that he couldn't afford that. He then said he was going to buy illegal street drugs to cope with his pain or that he was going to let the good Lord call him home. AS, CMA ?

## 2021-09-15 NOTE — Telephone Encounter (Signed)
Patient came in office today and has still not heard from pain management. I called them and they said they do have the referral however it has to be reviewed by the physician and on top of that they are scheduling up to two months out. I explained this to the patient and patient is upset and is in a lot of pain. I'm not really sure what to do except let you know and see if there is anyone else he could go to? ?

## 2021-09-15 NOTE — Telephone Encounter (Signed)
Patient has been advised several times that we would not prescribe opioid or controlled medications for him, especially at the dose that he has been prescribed. He can see his orthopedic provider or be seen in ER.

## 2021-09-20 ENCOUNTER — Telehealth: Payer: Self-pay | Admitting: Nurse Practitioner

## 2021-09-20 NOTE — Telephone Encounter (Signed)
Patient is asking if the labs he had done in march did it include the checking of his pancreas?  ?

## 2021-09-20 NOTE — Telephone Encounter (Signed)
Called pt he was advised to make an appt to see the provided  ?

## 2021-09-21 ENCOUNTER — Ambulatory Visit (INDEPENDENT_AMBULATORY_CARE_PROVIDER_SITE_OTHER): Payer: Commercial Managed Care - HMO | Admitting: Nurse Practitioner

## 2021-09-21 ENCOUNTER — Encounter: Payer: Self-pay | Admitting: Nurse Practitioner

## 2021-09-21 VITALS — BP 121/64 | HR 88 | Temp 98.3°F | Ht 74.02 in | Wt 242.4 lb

## 2021-09-21 DIAGNOSIS — R161 Splenomegaly, not elsewhere classified: Secondary | ICD-10-CM

## 2021-09-21 DIAGNOSIS — R109 Unspecified abdominal pain: Secondary | ICD-10-CM

## 2021-09-21 DIAGNOSIS — R160 Hepatomegaly, not elsewhere classified: Secondary | ICD-10-CM | POA: Diagnosis not present

## 2021-09-21 DIAGNOSIS — R10819 Abdominal tenderness, unspecified site: Secondary | ICD-10-CM

## 2021-09-21 NOTE — Therapy (Signed)
?OUTPATIENT PHYSICAL THERAPY LOWER EXTREMITY EVALUATION ? ? ?Patient Name: [name] ?MRN: [id number] ?DOB:01/20/1956, 66 y.o., male ?Today's Date: 09/22/2021 ? ? PT End of Session - 09/22/21 1457   ? ? Visit Number 1   ? Number of Visits 17   ? Date for PT Re-Evaluation 11/17/21   ? Authorization Type [employer] Managed   ? PT Start Time 1000   ? PT Stop Time 1443   ? PT Time Calculation (min) 42 min   ? Activity Tolerance Patient limited by fatigue;Patient limited by pain   ? Behavior During Therapy [facility] for tasks assessed/performed   ? ?  ?  ? ?  ? ? ?Past Medical History:  ?Diagnosis Date  ? Arthritis   ? lower back  ? Asthma   ? Atrial fibrillation ([place])   ? Atrial flutter ([place])   ? s/p CTI ablation by Dr Rayann Heman  ? Brain aneurysm 2009  ? questionable. A follow up CTA in 2009 showed no evidence of  ? Chronic back pain   ? DDD/stenosis  ? Colon polyps   ? 9 polyps removed 10/13/11  ? Complication of anesthesia 09/11/2012  ? slow to awaken after ablation  ? COPD (chronic obstructive pulmonary disease) ([place])   ? Diabetes mellitus   ? takes Metformin and Glimepiride daily  ? Emphysema   ? GERD (gastroesophageal reflux disease)   ? takes Omeprazole bid  ? Heart failure ([place])   ? History of shingles   ? HLD (hyperlipidemia)   ? takes Pravastatin daily  ? HTN (hypertension)   ? takes Prinizide daily  ? Obesity   ? OSA (obstructive sleep apnea)   ? not always using cpap  ? Overdose 2009  ? unintentional Flecanide overdose  ? Peripheral neuropathy   ? Short-term memory loss   ? Tobacco abuse   ? ?Past Surgical History:  ?Procedure Laterality Date  ? ANTERIOR CERVICAL DECOMP/DISCECTOMY FUSION  01/04/2012  ? Procedure: ANTERIOR CERVICAL DECOMPRESSION/DISCECTOMY FUSION 1 LEVEL/HARDWARE REMOVAL;  Surgeon: Ophelia Charter, MD;  Location: [facility];  Service: Neurosurgery;  Laterality: N/A;  explore cervical fusion Cervical six - seven  with removal of codman plate anterior cervical decompression with fusion interbody  prothesis plating and bonegraft  ? APPENDECTOMY  2-100yr ago  ? ATRIAL FIBRILLATION ABLATION N/A 09/11/2012  ? PT DID NOT HAVE AN ATRIAL FIBRILLATION ABLATION IN 2014!  ATRIAL FLUTTER ABLATION ONLY  ? ATRIAL FLUTTER ABLATION  09/11/2012  ? CTI ablation by Dr ARayann Heman ? BIOPSY  11/07/2019  ? Procedure: BIOPSY;  Surgeon: JMilus Banister MD;  Location: [facility];  Service: Endoscopy;;  ? CARDIAC CATHETERIZATION  2008  ? no significant CAD  ? CARDIOVERSION  05/05/2011  ? Procedure: CARDIOVERSION;  Surgeon: BLelon Perla MD;  [facility];  Service: Cardiovascular;  Laterality: N/A;  ? CARDIOVERSION Bilateral 07/26/2012  ? Procedure: CARDIOVERSION;  Surgeon: JMinus Breeding MD;  [facility];  Service: Cardiovascular;  Laterality: Bilateral;  ? CARPAL TUNNEL RELEASE  99/2000  ? bilateral  ? COLONOSCOPY WITH PROPOFOL N/A 12/13/2012  ? Procedure: COLONOSCOPY WITH PROPOFOL;  Surgeon: DMilus Banister MD;  [facility];  Service: Endoscopy;  Laterality: N/A;  ? ELECTROPHYSIOLOGIC STUDY N/A 04/21/2015  ? Procedure: Atrial Fibrillation Ablation;  Surgeon: JThompson Grayer MD;  [facility];  Service: Cardiovascular;  Laterality: N/A;  ? ESOPHAGOGASTRODUODENOSCOPY (EGD) WITH PROPOFOL N/A 11/07/2019  ? Procedure: ESOPHAGOGASTRODUODENOSCOPY (EGD) WITH PROPOFOL;  Surgeon: JMilus Banister  MD;  Location: [facility];  Service: Endoscopy;  Laterality: N/A;  ? HERNIA REPAIR    ? KNEE ARTHROSCOPY WITH MEDIAL MENISECTOMY Left 04/23/2021  ? Procedure: LEFT KNEE ARTHROSCOPY WITH PARTIAL MEDIAL MENISCECTOMY;  Surgeon: [name], MD;  Location: [facility];  Service: Orthopedics;  Laterality: Left;  ? KNEE SURGERY  6-70yr ago  ? left  ? LEFT HEART CATH AND CORONARY ANGIOGRAPHY N/A 09/19/2016  ? Procedure: Left Heart Cath and Coronary Angiography;  Surgeon: SBelva Crome MD;  [facility];  Service: Cardiovascular;  Laterality: N/A;  ? Left inguinal hernia repair    ? as a child  ?  NASAL SEPTOPLASTY W/ TURBINOPLASTY Bilateral 02/19/2014  ? Procedure: NASAL SEPTOPLASTY WITH BILATERAL TURBINATE REDUCTION;  Surgeon: KJodi Marble MD;  [facility]  Service: ENT;  Laterality: Bilateral;  ? NECK SURGERY  143yrago  ? right shoulder surgery  [id number]  ? cyst removed  ? TEE WITHOUT CARDIOVERSION  05/05/2011  ? Procedure: TRANSESOPHAGEAL ECHOCARDIOGRAM (TEE);  Surgeon: BriLelon PerlaD;  [facility];  Laterality: N/A;  ? TEE WITHOUT CARDIOVERSION N/A 04/21/2015  ? Procedure: TRANSESOPHAGEAL ECHOCARDIOGRAM (TEE);  Surgeon: TraSueanne MargaritaD;  [facility];  Service: Cardiovascular;  Laterality: N/A;  ? THROAT SURGERY  [id number]  ? "thought " it was cancer but came back not  ? TONSILLECTOMY    ? ?Patient Active Problem List  ? Diagnosis Date Noted  ? Atrophy of muscle of multiple sites 08/12/2021  ? 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 Chronic pain syndrome 08/12/2021  ? Depression, major, single episode, moderate (HCC)[place]/30/2023  ? Unilateral primary osteoarthritis, left knee 06/08/2021  ? Acute medial meniscal tear, left, initial encounter   ? Chronic meniscal tear of knee 03/15/2021  ? Dysphagia 04/17/2020  ? LVH (left ventricular hypertrophy) 11/07/2019  ? Heme positive stool 10/03/2019  ? Anemia 10/03/2019  ? Insulin dependent type 2 diabetes mellitus (HCC)[place]/04/2020  ? Acute on chronic diastolic heart failure (HCC)[place]/04/2020  ? Rash and nonspecific skin eruption 08/26/2019  ? Foot pain 08/22/2019  ? Right heart failure (HCC)[place]/24/2021  ? Leg swelling 07/28/2019  ? Educated about COVID-19 virus infection 07/28/2019  ? Diabetes (HCC)[place]/27/2021  ? Urticaria 05/15/2019  ? Dermatitis due to drug 04/18/2019  ? Edema 03/29/2019  ? Erectile dysfunction 03/29/2019  ? High risk medication use 03/29/2019  ? History of iron deficiency 03/29/2019  ? Pulmonary hypertension (HCC)[place]/13/2020  ? Venous stasis dermatitis of both lower  extremities 03/29/2019  ? Mixed hyperlipidemia 02/14/2019  ? Onychomycosis due to dermatophyte 11/06/2018  ? Hammer toe of right foot 07/17/2018  ? Tinea pedis of both feet 07/17/2018  ? History of radiofrequency ablation (RFA) procedure for cardiac arrhythmia 06/21/2018  ? Lumbar back pain with radiculopathy affecting lower extremity 06/21/2018  ? Hepatic cirrhosis (HCC)[place]/13/2019  ? Left lower quadrant pain 12/05/2017  ? Change in bowel habits 12/05/2017  ? Palpitations 10/03/2017  ? SOB (shortness of breath) 04/12/2017  ? Chest pain with moderate risk of acute coronary syndrome 08/28/2016  ? Unstable angina (HCC)[place]/13/2018  ? Longstanding persistent atrial fibrillation (HCC)[place]/10/2014  ? Deviated nasal septum 02/19/2014  ? History of colon polyps 11/22/2012  ? Syncope 10/17/2012  ? Acute asthmatic bronchitis 11/22/2011  ? Benign neoplasm of colon 10/13/2011  ? Unspecified gastritis and gastroduodenitis without mention of hemorrhage 10/13/2011  ? GERD (gastroesophageal reflux disease) 10/13/2011  ?  Body mass index (BMI) of 31.0-31.9 in adult 09/30/2011  ? Dyslipidemia 06/23/2011  ? Neuropathy 06/23/2011  ? Other fatigue 05/13/2011  ? Malaise and fatigue 05/13/2011  ? Rectal bleeding 04/18/2011  ? Atrial flutter ([place]) 04/18/2011  ? Chronic anticoagulation 11/10/2010  ? PAF (paroxysmal atrial fibrillation) ([place])   ? Tobacco abuse   ? COPD (chronic obstructive pulmonary disease) ([place]) 02/11/2010  ? Essential hypertension 01/28/2010  ? OSA (obstructive sleep apnea) 01/28/2010  ? ? ?PCP:  ?Ronnell Freshwater, NP ? ?REFERRING PROVIDER:  ?Ronnell Freshwater, NP ? ?REFERRING DIAG:  ?M62.59 (ICD-10-CM) - Atrophy of muscle of multiple sites ? ?THERAPY DIAG:  ?Muscle weakness (generalized) - Plan: PT plan of care cert/re-cert ? ?Other abnormalities of gait and mobility - Plan: PT plan of care cert/re-cert ? ?Difficulty in walking, not elsewhere classified - Plan: PT plan of care cert/re-cert ? ?Unsteadiness on feet - Plan:  PT plan of care cert/re-cert ? ?Pain in left lower leg - Plan: PT plan of care cert/re-cert ? ?Other low back pain - Plan: PT plan of care cert/re-cert ? ?ONSET DATE: Chronic ? ?SUBJECTIVE:  ? ?SUBJECTIVE STATEMENT:

## 2021-09-21 NOTE — Progress Notes (Signed)
Established patient visit ? ? ?Patient: [name]   DOB: [birth year]   66 y.o. Male  MRN: [id number] ?Visit Date: 09/21/2021 ? ? ?No chief complaint on file. ? ?Subjective  ?  ?HPI  ?The patient states that he has chronic abdominal tenderness. Has been worse at times. Is swollen. He is insulin dependant diabetes.  ?Reviewing medical records, he did have ultrasound of abdomen in 2021. Results were as follows: ?1. Coarse echogenic enlarged liver, corresponding to history of ?cirrhosis. No focal hepatic abnormality by sonography. ?2. Splenomegaly consistent with history of portal hypertension ?3. Borderline enlarged common bile duct ?4. Slightly echogenic left kidney suggesting medical renal disease. ?No hydronephrosis. Bilateral renal cysts ?5. Ectatic abdominal aorta with diameter up to 2.7 cm. Most of the ?aorta is obscured by gas. Ectatic abdominal aorta at risk for ?aneurysm development. Recommend followup by ultrasound in 5 years ? ? ? ?Medications: ?Outpatient Medications Prior to Visit  ?Medication Sig  ? albuterol (PROAIR HFA) 108 (90 Base) MCG/ACT inhaler Inhale 1-2 puffs into the lungs every 6 (six) hours as needed for wheezing or shortness of breath.  ? b complex vitamins tablet Take 1 tablet by mouth daily.   ? bisoprolol (ZEBETA) 10 MG tablet Take 10 mg by mouth daily.   ? Continuous Blood Gluc Receiver ([place]) DEVI   ? Continuous Blood Gluc Sensor (DEXCOM G6 SENSOR) MISC 1 Device by Does not apply route See admin instructions. Change every 10 days  ? Continuous Blood Gluc Transmit (DEXCOM G6 TRANSMITTER) MISC USE AS DIRECTED  ? cyclobenzaprine (FLEXERIL) 10 MG tablet Take 10 mg by mouth 3 (three) times daily.  ? diltiazem (CARDIZEM CD) 120 MG 24 hr capsule Take 120 mg by mouth daily.  ? Dulaglutide (TRULICITY) 3 IR/4.8NI SOPN Inject 3 mg as directed once a week.  ? DULoxetine (CYMBALTA) 20 MG capsule Take 1 capsule (20 mg total) by mouth daily.  ? gabapentin (NEURONTIN) 300 MG capsule Take  300 mg by mouth See admin instructions. Take 1 capsule (300 mg) by mouth in the morning & take 3 capsules (900 mg) by mouth at night  ? HUMALOG KWIKPEN 100 UNIT/ML KwikPen Inject 0-60 Units into the skin 3 (three) times daily with meals.  ? Insulin Pen Needle (EASY COMFORT PEN NEEDLES) 31G X 5 MM MISC To use with as needed insulin dosing up to four times daily as needed for hyperglycemia  ? losartan (COZAAR) 50 MG tablet TAKE 1 TABLET BY MOUTH DAILY  ? metolazone (ZAROXOLYN) 2.5 MG tablet TAKE 1 TABLET BY MOUTH ON TUESDAY AND THURSDAY 30 MINUTES BEFORE TAKING TORSEMIDE OR AS DIRECTED  ? Multiple Vitamin (MULTIVITAMIN WITH MINERALS) TABS Take 1 tablet by mouth daily.   ? Multiple Vitamins-Minerals (ZINC PO) Take 25 mg by mouth daily.  ? nitroGLYCERIN (NITROSTAT) 0.4 MG SL tablet Place 1 tablet (0.4 mg total) under the tongue every 5 (five) minutes x 3 doses as needed for chest pain.  ? omeprazole (PRILOSEC) 20 MG capsule Take 1 capsule (20 mg total) by mouth 2 (two) times daily.  ? oxyCODONE-acetaminophen (PERCOCET) 10-325 MG tablet Take 1.5 tablets by mouth in the morning, at noon, in the evening, and at bedtime.  ? potassium chloride SA (KLOR-CON) 20 MEQ tablet TAKE 1 TABLET BY MOUTH DAILY. TAKE AN EXTRA TABLET ON TUESDAY AND THURSDAY (Patient taking differently: 20 mEq See admin instructions. Take 1 tablet (20 meq) by mouth (scheduled) at night on Sundays, Mondays, Wednesdays, Fridays & Saturdays & take  2 tablets (40 meq) by mouth at night on Tuesdays & Thursdays)  ? pravastatin (PRAVACHOL) 80 MG tablet TAKE 1 TABLET BY MOUTH DAILY AT BEDTIME  ? tamsulosin (FLOMAX) 0.4 MG CAPS capsule TAKE 1 CAPSULE BY MOUTH EACH NIGHT AT BEDTIME  ? torsemide (DEMADEX) 20 MG tablet Take 2 tablets in the morning and 1 tablet in the PM  ? triamcinolone cream (KENALOG) 0.1 % Apply 1 application topically daily as needed.  ? umeclidinium-vilanterol (ANORO ELLIPTA) 62.5-25 MCG/INH AEPB Inhale 1 puff into the lungs daily. (Patient  taking differently: Inhale 1 puff into the lungs daily as needed (respiratory issues.).)  ? VITAMIN A PO Take 2,400 mcg by mouth daily.   ? Vitamin D, Ergocalciferol, (DRISDOL) 1.25 MG (50000 UT) CAPS capsule Take 50,000 Units by mouth every Friday.  ? warfarin (COUMADIN) 5 MG tablet TAKE 1 TO 1 AND 1/2 TABLETS BY MOUTH DAILY AS DIRECTED BY THE COUMADIN CLINIC  ? ?No facility-administered medications prior to visit.  ? ? ?Review of Systems  ?Constitutional:  Positive for activity change and fatigue. Negative for chills and fever.  ?     States that activity level continues to decline due to muscle and joint pain   ?HENT:  Negative for congestion, postnasal drip, rhinorrhea, sinus pressure, sinus pain, sneezing and sore throat.   ?Eyes: Negative.   ?Respiratory:  Positive for shortness of breath and wheezing. Negative for cough.   ?Cardiovascular:  Negative for chest pain and palpitations.  ?Gastrointestinal:  Negative for constipation, diarrhea, nausea and vomiting.  ?     Chronic upper abdominal tenderness   ?Endocrine: Negative for cold intolerance, heat intolerance, polydipsia and polyuria.  ?     Poor control of insulin dependant type 2 diabetes   ?Genitourinary:  Negative for dysuria, frequency and urgency.  ?Musculoskeletal:  Positive for arthralgias, gait problem, joint swelling, myalgias, neck pain and neck stiffness. Negative for back pain.  ?Skin:  Negative for rash.  ?Allergic/Immunologic: Negative for environmental allergies.  ?Neurological:  Positive for weakness. Negative for dizziness and headaches.  ?Psychiatric/Behavioral:  The patient is nervous/anxious.   ? ? ? ? Objective  ?  ?BP 121/64   Pulse 88   Temp 98.3 ?F (36.8 ?C)   Ht 6' 2.02" (1.88 m)   Wt 242 lb 6.4 oz (110 kg)   SpO2 95%   BMI 31.11 kg/m?  ?BP Readings from Last 3 Encounters:  ?09/21/21 121/64  ?09/09/21 137/73  ?08/12/21 (!) 113/48  ?  ?Wt Readings from Last 3 Encounters:  ?09/21/21 242 lb 6.4 oz (110 kg)  ?09/09/21 248 lb 1.9  oz (112.5 kg)  ?08/12/21 249 lb 6.4 oz (113.1 kg)  ?  ?Physical Exam ?Vitals and nursing note reviewed.  ?Constitutional:   ?   Appearance: Normal appearance. He is well-developed.  ?HENT:  ?   Head: Normocephalic and atraumatic.  ?Eyes:  ?   Pupils: Pupils are equal, round, and reactive to light.  ?Cardiovascular:  ?   Rate and Rhythm: Normal rate. Rhythm irregular.  ?   Pulses: Normal pulses.  ?   Heart sounds: Normal heart sounds.  ?Pulmonary:  ?   Effort: Pulmonary effort is normal.  ?   Breath sounds: Rhonchi present.  ?   Comments: Congested, non-productive cough noted  ?Abdominal:  ?   General: Bowel sounds are normal. There is distension.  ?   Palpations: Abdomen is soft. There is no mass.  ?   Tenderness: There is abdominal tenderness. There  is no right CVA tenderness, left CVA tenderness, guarding or rebound.  ?   Hernia: No hernia is present.  ?Musculoskeletal:     ?   General: Normal range of motion.  ?   Cervical back: Normal range of motion and neck supple.  ?   Comments: Generalized joint pain without palpable abnormalities or deformities. Patient using a walking wane to help with mobility and balance   ?Lymphadenopathy:  ?   Cervical: No cervical adenopathy.  ?Skin: ?   General: Skin is warm and dry.  ?   Capillary Refill: Capillary refill takes less than 2 seconds.  ?Neurological:  ?   General: No focal deficit present.  ?   Mental Status: He is alert and oriented to person, place, and time.  ?Psychiatric:     ?   Mood and Affect: Mood normal.     ?   Behavior: Behavior normal.     ?   Thought Content: Thought content normal.     ?   Judgment: Judgment normal.  ?  ? ?Results for orders placed or performed in visit on 09/21/21  ?Amylase  ?Result Value Ref Range  ? Amylase 47 31 - 110 U/L  ?Lipase  ?Result Value Ref Range  ? Lipase 32 13 - 78 U/L  ?Hepatic function panel  ?Result Value Ref Range  ? Total Protein 7.1 6.0 - 8.5 g/dL  ? Albumin 4.2 3.8 - 4.8 g/dL  ? Bilirubin Total 0.4 0.0 - 1.2 mg/dL  ?  Bilirubin, Direct 0.17 0.00 - 0.40 mg/dL  ? Alkaline Phosphatase 141 (H) 44 - 121 IU/L  ? AST 27 0 - 40 IU/L  ? ALT 16 0 - 44 IU/L  ?Acute Viral Hepatitis (HAV, HBV, HCV)  ?Result Value Ref Range  ? Hep A IgM

## 2021-09-22 ENCOUNTER — Ambulatory Visit: Payer: Commercial Managed Care - HMO | Attending: Nurse Practitioner

## 2021-09-22 DIAGNOSIS — M5459 Other low back pain: Secondary | ICD-10-CM | POA: Diagnosis present

## 2021-09-22 DIAGNOSIS — M79662 Pain in left lower leg: Secondary | ICD-10-CM | POA: Insufficient documentation

## 2021-09-22 DIAGNOSIS — R2689 Other abnormalities of gait and mobility: Secondary | ICD-10-CM | POA: Diagnosis present

## 2021-09-22 DIAGNOSIS — M6281 Muscle weakness (generalized): Secondary | ICD-10-CM | POA: Diagnosis present

## 2021-09-22 DIAGNOSIS — M6259 Muscle wasting and atrophy, not elsewhere classified, multiple sites: Secondary | ICD-10-CM | POA: Insufficient documentation

## 2021-09-22 DIAGNOSIS — R2681 Unsteadiness on feet: Secondary | ICD-10-CM | POA: Diagnosis present

## 2021-09-22 DIAGNOSIS — R262 Difficulty in walking, not elsewhere classified: Secondary | ICD-10-CM | POA: Insufficient documentation

## 2021-09-22 LAB — HEPATIC FUNCTION PANEL
ALT: 16 IU/L (ref 0–44)
AST: 27 IU/L (ref 0–40)
Albumin: 4.2 g/dL (ref 3.8–4.8)
Alkaline Phosphatase: 141 IU/L — ABNORMAL HIGH (ref 44–121)
Bilirubin Total: 0.4 mg/dL (ref 0.0–1.2)
Bilirubin, Direct: 0.17 mg/dL (ref 0.00–0.40)
Total Protein: 7.1 g/dL (ref 6.0–8.5)

## 2021-09-22 LAB — ACUTE VIRAL HEPATITIS (HAV, HBV, HCV)
HCV Ab: NONREACTIVE
Hep A IgM: NEGATIVE
Hep B C IgM: NEGATIVE
Hepatitis B Surface Ag: NEGATIVE

## 2021-09-22 LAB — AMYLASE: Amylase: 47 U/L (ref 31–110)

## 2021-09-22 LAB — LIPASE: Lipase: 32 U/L (ref 13–78)

## 2021-09-22 LAB — HCV INTERPRETATION

## 2021-09-26 DIAGNOSIS — R161 Splenomegaly, not elsewhere classified: Secondary | ICD-10-CM | POA: Insufficient documentation

## 2021-09-26 DIAGNOSIS — R109 Unspecified abdominal pain: Secondary | ICD-10-CM | POA: Insufficient documentation

## 2021-09-26 DIAGNOSIS — R160 Hepatomegaly, not elsewhere classified: Secondary | ICD-10-CM | POA: Insufficient documentation

## 2021-09-27 NOTE — Progress Notes (Signed)
Please let the patient know that labs look good. Liver functions are essentially normal. Pancreatic enzymes were normal. Hepatitis screen was negative. Once he gets abdominal ultrasound, I can advise better. Thanks so much.   -HB

## 2021-09-28 ENCOUNTER — Ambulatory Visit
Admission: RE | Admit: 2021-09-28 | Discharge: 2021-09-28 | Disposition: A | Discharge status: Home / Self Care | Payer: Commercial Managed Care - HMO | Source: Ambulatory Visit | Attending: Nurse Practitioner | Admitting: Nurse Practitioner

## 2021-09-28 DIAGNOSIS — R161 Splenomegaly, not elsewhere classified: Secondary | ICD-10-CM

## 2021-09-28 DIAGNOSIS — R109 Unspecified abdominal pain: Secondary | ICD-10-CM

## 2021-09-28 DIAGNOSIS — R160 Hepatomegaly, not elsewhere classified: Secondary | ICD-10-CM

## 2021-09-29 ENCOUNTER — Telehealth: Payer: Self-pay | Admitting: Orthopaedic Surgery

## 2021-09-29 ENCOUNTER — Ambulatory Visit: Payer: Self-pay

## 2021-09-29 ENCOUNTER — Telehealth: Payer: Self-pay

## 2021-09-29 ENCOUNTER — Ambulatory Visit (INDEPENDENT_AMBULATORY_CARE_PROVIDER_SITE_OTHER): Payer: Commercial Managed Care - HMO | Admitting: Surgery

## 2021-09-29 ENCOUNTER — Ambulatory Visit: Payer: Commercial Managed Care - HMO

## 2021-09-29 ENCOUNTER — Encounter: Payer: Self-pay | Admitting: Surgery

## 2021-09-29 VITALS — BP 128/66 | HR 71

## 2021-09-29 DIAGNOSIS — M7022 Olecranon bursitis, left elbow: Secondary | ICD-10-CM | POA: Diagnosis not present

## 2021-09-29 DIAGNOSIS — M778 Other enthesopathies, not elsewhere classified: Secondary | ICD-10-CM | POA: Diagnosis not present

## 2021-09-29 MED ORDER — LIDOCAINE HCL 1 % IJ SOLN
1.0000 mL | INTRAMUSCULAR | Status: AC | PRN
  Administered 2021-09-29: 1 mL

## 2021-09-29 MED ORDER — BUPIVACAINE HCL 0.25 % IJ SOLN
3.0000 mL | INTRAMUSCULAR | Status: AC | PRN
  Administered 2021-09-29: 3 mL via INTRA_ARTICULAR

## 2021-09-29 NOTE — Telephone Encounter (Signed)
Per PA Jeneen Rinks pt need an appt in 1 wk with Dr. Lorin Mercy. Lorin Mercy has no openings until 6/6. Please call pt and open slot. Pt phone number is [phone]. ?

## 2021-09-29 NOTE — Progress Notes (Signed)
? ?Office Visit Note ?  ?Patient: [name]           ?Date of Birth: 1955-12-24           ?MRN: [id number] ?Visit Date: 09/29/2021 ?             ?Requested by: Ronnell Freshwater, NP ?[address] ?[place],  [place] [address] ?PCP: Ronnell Freshwater, NP ? ? ?Assessment & Plan: ?Visit Diagnoses:  ?1. Olecranon bursitis of left elbow   ?2. Triceps tendonitis   ? ? ?Plan: Patient's glucose today 187 on his monitor.  I did agree to perform olecranon bursa aspiration.  Patient sent left elbow is prepped Betadine and olecranon bursa was aspirated and then 3 cc of straight Marcaine was used.  Compression bandage applied.  We will leave this on for 48 hours.  Follow-up with Dr. Lorin Mercy in 1 week for recheck. ? ?Follow-Up Instructions: Return in about 1 week (around 10/06/2021) for With Dr. Lorin Mercy recheck left elbow and forearm pain.  ? ?Orders:  ?Orders Placed This Encounter  ?Procedures  ? Medium Joint Inj  ? XR Elbow 2 Views Left  ? ?No orders of the defined types were placed in this encounter. ? ? ? ? Procedures: ?Medium Joint Inj: L olecranon bursa on 09/29/2021 9:57 AM ?Indications: joint swelling and pain ?Details: 22 G needle, posterior approach ?Medications: 1 mL lidocaine 1 %; 3 mL bupivacaine 0.25 % ?Aspirate: 5 mL serous ? ?Steroid was not used due to patient's glucose being 187 ? ? ? ? ?Clinical Data: ?No additional findings. ? ? ?Subjective: ?Chief Complaint  ?Patient presents with  ? Left Elbow - Edema, Pain  ? ? ?HPI ?66 year old white male comes in today with complaints of acute left elbow/proximal forearm pain and swelling.  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  Denies any specific injury on Friday but states that Saturday morning he woke up with olecranon bursal swelling along with proximal left forearm swelling.  Pain in both areas.  He did have a issue with olecranon bursitis about 10 to 12 years ago and this  eventually resolved.  Does not remember the treatment for that.  He localizes pain to the distal triceps tendon, olecranon bursa and proximal forearm. ?Review of Systems ?No current cardiopulmonary GI/GU issues ? ?Objective: ?Vital Signs: BP 128/66 (BP Location: Right Arm)   Pulse 71  ? ?Physical Exam ?HENT:  ?   Head: Normocephalic and atraumatic.  ?   Nose: Nose normal.  ?Eyes:  ?   Extraocular Movements: Extraocular movements intact.  ?Pulmonary:  ?   Effort: No respiratory distress.  ?Musculoskeletal:  ?   Comments: Left elbow good range of motion.  He does have moderate swelling of the left olecranon bursa.  He does have some tenderness at the distal triceps tendon likely on insertion site.  Question may be a small defect in this area.  Left number is also tender.  Patient also does have mild to moderate proximal forearm swelling with moderate to marked tenderness.  He is neurovascular intact.  Distal triceps pain with resisted elbow extension.  ?Neurological:  ?   Mental Status: He is alert.  ?Psychiatric:     ?   Mood and Affect: Mood normal.  ? ? ?Ortho Exam ? ?Specialty Comments:  ?No specialty comments available. ? ?Imaging: ?US Abdomen Complete ? ?Result Date: 09/29/2021 ?CLINICAL  DATA:  Abdominal pain EXAM: ABDOMEN ULTRASOUND COMPLETE COMPARISON:  Abdominal ultrasound 10/10/2019 FINDINGS: Gallbladder: Contains a moderate amount of echogenic sludge. Mild wall thickening measuring 4 mm and trace pericholecystic fluid. Negative sonographic Murphy sign. Common bile duct: Diameter: 6 mm Liver: Enlarged with diffuse inhomogeneous increased echogenicity of the parenchyma. No focal mass identified. Portal vein is patent on color Doppler imaging with normal direction of blood flow towards the liver. IVC: No abnormality visualized. Pancreas: Visualized portion unremarkable. Spleen: Markedly enlarged measuring up to 21.4 cm in length. Right Kidney: Length: 12.7 cm. Echogenicity within normal limits. Multiple simple  and mildly complex appearing cysts are again seen measuring up to 2.9 x 2.2 cm. No hydronephrosis. Left Kidney: Length: 15 cm. Echogenicity within normal limits. Multiple simple and mildly complex appearing cysts are again seen measuring up to 4.5 x 3.3 cm. No hydronephrosis. Abdominal aorta: No aneurysm visualized. Other findings: None. IMPRESSION: 1. Hepatomegaly with abnormal appearance of the parenchyma suggesting hepatic steatosis and/or other hepatocellular disease. 2. Marked splenomegaly. 3. Bilateral simple and mildly complex appearing renal cysts, as seen on previous studies. Electronically Signed   By: Ofilia Neas M.D.   On: 09/29/2021 09:24   ? ? ?PMFS History: ?Patient Active Problem List  ? Diagnosis Date Noted  ? Abdominal discomfort 09/26/2021  ? Hepatomegaly 09/26/2021  ? Splenomegaly 09/26/2021  ? Atrophy of muscle of multiple sites 08/12/2021  ? 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 Chronic pain syndrome 08/12/2021  ? Depression, major, single episode, moderate ([place]) 08/12/2021  ? Unilateral primary osteoarthritis, left knee 06/08/2021  ? Acute medial meniscal tear, left, initial encounter   ? Chronic meniscal tear of knee 03/15/2021  ? Dysphagia 04/17/2020  ? LVH (left ventricular hypertrophy) 11/07/2019  ? Heme positive stool 10/03/2019  ? Anemia 10/03/2019  ? Insulin dependent type 2 diabetes mellitus ([place]) 08/26/2019  ? Acute on chronic diastolic heart failure ([place]) 08/26/2019  ? Rash and nonspecific skin eruption 08/26/2019  ? Foot pain 08/22/2019  ? Right heart failure ([place]) 08/07/2019  ? Leg swelling 07/28/2019  ? Educated about COVID-19 virus infection 07/28/2019  ? Diabetes ([place]) 06/12/2019  ? Urticaria 05/15/2019  ? Dermatitis due to drug 04/18/2019  ? Edema 03/29/2019  ? Erectile dysfunction 03/29/2019  ? High risk medication use 03/29/2019  ? History of iron deficiency 03/29/2019  ? Pulmonary hypertension ([place]) 03/29/2019  ? Venous stasis dermatitis of both lower  extremities 03/29/2019  ? Mixed hyperlipidemia 02/14/2019  ? Onychomycosis due to dermatophyte 11/06/2018  ? Hammer toe of right foot 07/17/2018  ? Tinea pedis of both feet 07/17/2018  ? History of radiofrequency ablation (RFA) procedure for cardiac arrhythmia 06/21/2018  ? Lumbar back pain with radiculopathy affecting lower extremity 06/21/2018  ? Hepatic cirrhosis ([place]) 12/26/2017  ? Left lower quadrant pain 12/05/2017  ? Change in bowel habits 12/05/2017  ? Palpitations 10/03/2017  ? SOB (shortness of breath) 04/12/2017  ? Chest pain with moderate risk of acute coronary syndrome 08/28/2016  ? Unstable angina ([place]) 08/26/2016  ? Longstanding persistent atrial fibrillation ([place]) 04/21/2015  ? Deviated nasal septum 02/19/2014  ? History of colon polyps 11/22/2012  ? Syncope 10/17/2012  ? Acute asthmatic bronchitis 11/22/2011  ? Benign neoplasm of colon 10/13/2011  ? Unspecified gastritis and gastroduodenitis without mention of hemorrhage 10/13/2011  ? GERD (gastroesophageal reflux disease) 10/13/2011  ? Body mass index (BMI) of 31.0-31.9 in adult 09/30/2011  ? Dyslipidemia 06/23/2011  ? Neuropathy 06/23/2011  ? Other fatigue 05/13/2011  ? Malaise  and fatigue 05/13/2011  ? Rectal bleeding 04/18/2011  ? Atrial flutter ([place]) 04/18/2011  ? Chronic anticoagulation 11/10/2010  ? PAF (paroxysmal atrial fibrillation) ([place])   ? Tobacco abuse   ? COPD (chronic obstructive pulmonary disease) ([place]) 02/11/2010  ? Essential hypertension 01/28/2010  ? OSA (obstructive sleep apnea) 01/28/2010  ? ?Past Medical History:  ?Diagnosis Date  ? Arthritis   ? lower back  ? Asthma   ? Atrial fibrillation ([place])   ? Atrial flutter ([place])   ? s/p CTI ablation by Dr Rayann Heman  ? Brain aneurysm 2009  ? questionable. A follow up CTA in 2009 showed no evidence of  ? Chronic back pain   ? DDD/stenosis  ? Colon polyps   ? 9 polyps removed 10/13/11  ? Complication of anesthesia 09/11/2012  ? slow to awaken after ablation  ? COPD (chronic obstructive  pulmonary disease) ([place])   ? Diabetes mellitus   ? takes Metformin and Glimepiride daily  ? Emphysema   ? GERD (gastroesophageal reflux disease)   ? takes Omeprazole bid  ? Heart failure ([place])   ? History of shi

## 2021-09-29 NOTE — Telephone Encounter (Signed)
I called, worked in for appointment next Tuesday. ?

## 2021-09-29 NOTE — Telephone Encounter (Signed)
PT called and left voicemail regarding missed visit. Explained attendance policy and left reminder of next visit. ? ?Ward Chatters, PT ?09/29/21 11:14 AM ? ?

## 2021-09-29 NOTE — Therapy (Incomplete)
?OUTPATIENT PHYSICAL THERAPY TREATMENT NOTE ? ? ?Patient Name: [name] ?MRN: [id number] ?DOB:[birth year], 66 y.o., male ?Today's Date: 09/29/2021 ? ?PCP: *** ?REFERRING PROVIDER: *** ? ?END OF SESSION:  ? ? ?Past Medical History:  ?Diagnosis Date  ? Arthritis   ? lower back  ? Asthma   ? Atrial fibrillation ([place])   ? Atrial flutter ([place])   ? s/p CTI ablation by Dr Rayann Heman  ? Brain aneurysm 2009  ? questionable. A follow up CTA in 2009 showed no evidence of  ? Chronic back pain   ? DDD/stenosis  ? Colon polyps   ? 9 polyps removed 10/13/11  ? Complication of anesthesia 09/11/2012  ? slow to awaken after ablation  ? COPD (chronic obstructive pulmonary disease) ([place])   ? Diabetes mellitus   ? takes Metformin and Glimepiride daily  ? Emphysema   ? GERD (gastroesophageal reflux disease)   ? takes Omeprazole bid  ? Heart failure ([place])   ? History of shingles   ? HLD (hyperlipidemia)   ? takes Pravastatin daily  ? HTN (hypertension)   ? takes Prinizide daily  ? Obesity   ? OSA (obstructive sleep apnea)   ? not always using cpap  ? Overdose 2009  ? unintentional Flecanide overdose  ? Peripheral neuropathy   ? Short-term memory loss   ? Tobacco abuse   ? ?Past Surgical History:  ?Procedure Laterality Date  ? ANTERIOR CERVICAL DECOMP/DISCECTOMY FUSION  01/04/2012  ? Procedure: ANTERIOR CERVICAL DECOMPRESSION/DISCECTOMY FUSION 1 LEVEL/HARDWARE REMOVAL;  Surgeon: Ophelia Charter, MD;  Location: [place] NEURO ORS;  Service: Neurosurgery;  Laterality: N/A;  explore cervical fusion Cervical six - seven  with removal of codman plate anterior cervical decompression with fusion interbody prothesis plating and bonegraft  ? APPENDECTOMY  2-86yr ago  ? ATRIAL FIBRILLATION ABLATION N/A 09/11/2012  ? PT DID NOT HAVE AN ATRIAL FIBRILLATION ABLATION IN 2014!  ATRIAL FLUTTER ABLATION ONLY  ? ATRIAL FLUTTER ABLATION  09/11/2012  ? CTI ablation by Dr ARayann Heman ? BIOPSY  11/07/2019  ? Procedure: BIOPSY;  Surgeon: JMilus Banister MD;  [facility];  Service: Endoscopy;;  ? CARDIAC CATHETERIZATION  2008  ? no significant CAD  ? CARDIOVERSION  05/05/2011  ? Procedure: CARDIOVERSION;  Surgeon: BLelon Perla MD;  [facility];  Service: Cardiovascular;  Laterality: N/A;  ? CARDIOVERSION Bilateral 07/26/2012  ? Procedure: CARDIOVERSION;  Surgeon: JMinus Breeding MD;  [facility];  Service: Cardiovascular;  Laterality: Bilateral;  ? CARPAL TUNNEL RELEASE  99/2000  ? bilateral  ? COLONOSCOPY WITH PROPOFOL N/A 12/13/2012  ? Procedure: COLONOSCOPY WITH PROPOFOL;  Surgeon: DMilus Banister MD;  [facility];  Service: Endoscopy;  Laterality: N/A;  ? ELECTROPHYSIOLOGIC STUDY N/A 04/21/2015  ? Procedure: Atrial Fibrillation Ablation;  Surgeon: JThompson Grayer MD;  [facility];  Service: Cardiovascular;  Laterality: N/A;  ? ESOPHAGOGASTRODUODENOSCOPY (EGD) WITH PROPOFOL N/A 11/07/2019  ? Procedure: ESOPHAGOGASTRODUODENOSCOPY (EGD) WITH PROPOFOL;  Surgeon: JMilus Banister MD;  [facility];  Service: Endoscopy;  Laterality: N/A;  ? HERNIA REPAIR    ? KNEE ARTHROSCOPY WITH MEDIAL MENISECTOMY Left 04/23/2021  ? Procedure: LEFT KNEE ARTHROSCOPY WITH PARTIAL MEDIAL MENISCECTOMY;  Surgeon: YMarybelle Killings MD;  [place]  Service: Orthopedics;  Laterality: Left;  ? KNEE SURGERY  6-75yrago  ? left  ? LEFT HEART CATH AND CORONARY ANGIOGRAPHY N/A 09/19/2016  ? Procedure: Left Heart Cath and Coronary Angiography;  Surgeon:  Belva Crome, MD;  Location: [place] CV LAB;  Service: Cardiovascular;  Laterality: N/A;  ? Left inguinal hernia repair    ? as a child  ? NASAL SEPTOPLASTY W/ TURBINOPLASTY Bilateral 02/19/2014  ? Procedure: NASAL SEPTOPLASTY WITH BILATERAL TURBINATE REDUCTION;  Surgeon: Jodi Marble, MD;  Location: [place];  Service: ENT;  Laterality: Bilateral;  ? NECK SURGERY  31yr ago  ? right shoulder surgery  4-559yrago  ? cyst removed  ? TEE WITHOUT CARDIOVERSION  05/05/2011  ? Procedure:  TRANSESOPHAGEAL ECHOCARDIOGRAM (TEE);  Surgeon: BrLelon PerlaMD;  [facility] Service: Cardiovascular;  Laterality: N/A;  ? TEE WITHOUT CARDIOVERSION N/A 04/21/2015  ? Procedure: TRANSESOPHAGEAL ECHOCARDIOGRAM (TEE);  Surgeon: TrSueanne MargaritaMD;  [facility];  Service: Cardiovascular;  Laterality: N/A;  ? THROAT SURGERY  4-5y36yrgo  ? "thought " it was cancer but came back not  ? TONSILLECTOMY    ? ?Patient Active Problem List  ? Diagnosis Date Noted  ? Abdominal discomfort 09/26/2021  ? Hepatomegaly 09/26/2021  ? Splenomegaly 09/26/2021  ? Atrophy of muscle of multiple sites 08/12/2021  ? Inflammatory polyarthropathy (HCCLineville3/30/2023  ? Chronic pain syndrome 08/12/2021  ? Depression, major, single episode, moderate (HCCVenice3/30/2023  ? Unilateral primary osteoarthritis, left knee 06/08/2021  ? Acute medial meniscal tear, left, initial encounter   ? Chronic meniscal tear of knee 03/15/2021  ? Dysphagia 04/17/2020  ? LVH (left ventricular hypertrophy) 11/07/2019  ? Heme positive stool 10/03/2019  ? Anemia 10/03/2019  ? Insulin dependent type 2 diabetes mellitus (HCCOsage4/04/2020  ? Acute on chronic diastolic heart failure (HCCMcClellan Park4/04/2020  ? Rash and nonspecific skin eruption 08/26/2019  ? Foot pain 08/22/2019  ? Right heart failure (HCCLynndyl3/24/2021  ? Leg swelling 07/28/2019  ? Educated about COVID-19 virus infection 07/28/2019  ? Diabetes (HCCPort Gibson1/27/2021  ? Urticaria 05/15/2019  ? Dermatitis due to drug 04/18/2019  ? Edema 03/29/2019  ? Erectile dysfunction 03/29/2019  ? High risk medication use 03/29/2019  ? History of iron deficiency 03/29/2019  ? Pulmonary hypertension (HCCPlato1/13/2020  ? Venous stasis dermatitis of both lower extremities 03/29/2019  ? Mixed hyperlipidemia 02/14/2019  ? Onychomycosis due to dermatophyte 11/06/2018  ? Hammer toe of right foot 07/17/2018  ? Tinea pedis of both feet 07/17/2018  ? History of radiofrequency ablation (RFA) procedure for cardiac arrhythmia  06/21/2018  ? Lumbar back pain with radiculopathy affecting lower extremity 06/21/2018  ? Hepatic cirrhosis (HCCClear Creek8/13/2019  ? Left lower quadrant pain 12/05/2017  ? Change in bowel habits 12/05/2017  ? Palpitations 10/03/2017  ? SOB (shortness of breath) 04/12/2017  ? Chest pain with moderate risk of acute coronary syndrome 08/28/2016  ? Unstable angina (HCCPaducah4/13/2018  ? Longstanding persistent atrial fibrillation (HCCWhite Hills2/10/2014  ? Deviated nasal septum 02/19/2014  ? History of colon polyps 11/22/2012  ? Syncope 10/17/2012  ? Acute asthmatic bronchitis 11/22/2011  ? Benign neoplasm of colon 10/13/2011  ? Unspecified gastritis and gastroduodenitis without mention of hemorrhage 10/13/2011  ? GERD (gastroesophageal reflux disease) 10/13/2011  ? Body mass index (BMI) of 31.0-31.9 in adult 09/30/2011  ? Dyslipidemia 06/23/2011  ? Neuropathy 06/23/2011  ? Other fatigue 05/13/2011  ? Malaise and fatigue 05/13/2011  ? Rectal bleeding 04/18/2011  ? Atrial flutter (HCCCanyon Creek2/07/2010  ? Chronic anticoagulation 11/10/2010  ? PAF (paroxysmal atrial fibrillation) (HCCCorwin ? Tobacco abuse   ? COPD (chronic obstructive pulmonary disease) (HCCBinford9/29/2011  ? Essential hypertension  01/28/2010  ? OSA (obstructive sleep apnea) 01/28/2010  ? ? ?REFERRING DIAG: M62.59 (ICD-10-CM) - Atrophy of muscle of multiple sites ? ?THERAPY DIAG:  ?No diagnosis found. ? ?PERTINENT HISTORY:  ?HTN, Afib, unstable angina, CHF, pulmonary HTN, COPD, DM II, Neuropathy, depression ? ?PRECAUTIONS: Fall ? ?SUBJECTIVE:  ?*** ? ?PAIN:  ?Are you having pain?  ?Yes: NPRS scale: 5/10 (10/10 at worst) ?Pain location: L calf/heel ?Pain description: sharp ?Aggravating factors: standing, waking ?Relieving factors: rest, medication ?  ?Are you having pain?  ?Yes: NPRS scale: 3/10 7/10 at worst) ?Pain location: bilateral shoulders ?Pain description: dull ache ?Aggravating factors: lifting, reaching ?Relieving factors: rest, medication ?  ?Are you having pain?  ?Yes:  NPRS scale: 3/10 (10/10 at worst) ?Pain location: lower back ?Pain description: dull ache ?Aggravating factors: bending, twisting, standing, walking ?Relieving factors: rest, medication ? ? ?OBJECTIVE: (objectiv

## 2021-09-30 ENCOUNTER — Telehealth: Payer: Self-pay | Admitting: *Deleted

## 2021-09-30 NOTE — Telephone Encounter (Signed)
[id number] to schedule patient for lung cancer screening CT.

## 2021-10-02 ENCOUNTER — Telehealth: Payer: Self-pay

## 2021-10-02 ENCOUNTER — Ambulatory Visit: Payer: Commercial Managed Care - HMO

## 2021-10-02 NOTE — Telephone Encounter (Signed)
LVM about missed appt today, reminded of attendance policy and next scheduled appt.  2nd no-show, cancelling all appts after next due to attendance policy.  Evelene Croon, PTA 10/02/21 11:04 AM

## 2021-10-02 NOTE — Therapy (Incomplete)
OUTPATIENT PHYSICAL THERAPY TREATMENT NOTE   Patient Name: [name] MRN: [id number] DOB:Dec 10, 1955, 66 y.o., male Today's Date: 10/02/2021  PCP: Ronnell Freshwater, NP REFERRING PROVIDER: Ronnell Freshwater, NP  END OF SESSION:    Past Medical History:  Diagnosis Date   Arthritis    lower back   Asthma    Atrial fibrillation [id number])    Atrial flutter [facility])    s/p CTI ablation by Dr Rayann Heman   Brain aneurysm 2009   questionable. A follow up CTA in 2009 showed no evidence of   Chronic back pain    DDD/stenosis   Colon polyps    9 polyps removed 9/93/71   Complication of anesthesia 09/11/2012   slow to awaken after ablation   COPD (chronic obstructive pulmonary disease) ([place])    Diabetes mellitus    takes Metformin and Glimepiride daily   Emphysema    GERD (gastroesophageal reflux disease)    takes Omeprazole bid   Heart failure ([place])    History of shingles    HLD (hyperlipidemia)    takes Pravastatin daily   HTN (hypertension)    takes Prinizide daily   Obesity    OSA (obstructive sleep apnea)    not always using cpap   Overdose 2009   unintentional Flecanide overdose   Peripheral neuropathy    Short-term memory loss    Tobacco abuse    Past Surgical History:  Procedure Laterality Date   ANTERIOR CERVICAL DECOMP/DISCECTOMY FUSION  01/04/2012   Procedure: ANTERIOR CERVICAL DECOMPRESSION/DISCECTOMY FUSION 1 LEVEL/HARDWARE REMOVAL;  Surgeon: Ophelia Charter, MD;  Location: [place] NEURO ORS;  Service: Neurosurgery;  Laterality: N/A;  explore cervical fusion Cervical six - seven  with removal of codman plate anterior cervical decompression with fusion interbody prothesis plating and bonegraft   APPENDECTOMY  2-25yr ago   [place]Star [place]N/A 09/11/2012   PT DID NOT HAVE AN ATRIAL FIBRILLATION ABLATION IN 2014!  ATRIAL FLUTTER ABLATION ONLY   ATRIAL FLUTTER ABLATION  09/11/2012   CTI ablation by Dr ARayann Heman  BIOPSY  11/07/2019   Procedure: BIOPSY;  Surgeon:  JMilus Banister MD;  [facility];  Service: Endoscopy;;   CARDIAC CATHETERIZATION  2008   no significant CAD   CARDIOVERSION  05/05/2011   Procedure: CARDIOVERSION;  Surgeon: BLelon Perla MD;  [place]  Service: Cardiovascular;  Laterality: N/A;   CARDIOVERSION Bilateral 07/26/2012   Procedure: CARDIOVERSION;  Surgeon: JMinus Breeding MD;  [facility];  Service: Cardiovascular;  Laterality: Bilateral;   CARPAL TUNNEL RELEASE  99/2000   bilateral   COLONOSCOPY WITH PROPOFOL N/A 12/13/2012   Procedure: COLONOSCOPY WITH PROPOFOL;  Surgeon: DMilus Banister MD;  [facility];  Service: Endoscopy;  Laterality: N/A;   ELECTROPHYSIOLOGIC STUDY N/A 04/21/2015   Procedure: Atrial Fibrillation Ablation;  Surgeon: JThompson Grayer MD;  [facility] [place];  Service: Cardiovascular;  Laterality: N/A;   ESOPHAGOGASTRODUODENOSCOPY (EGD) WITH PROPOFOL N/A 11/07/2019   Procedure: ESOPHAGOGASTRODUODENOSCOPY (EGD) WITH PROPOFOL;  Surgeon: JMilus Banister MD;  [facility];  Service: Endoscopy;  Laterality: N/A;   HERNIA REPAIR     KNEE ARTHROSCOPY WITH MEDIAL MENISECTOMY Left 04/23/2021   Procedure: LEFT KNEE ARTHROSCOPY WITH PARTIAL MEDIAL MENISCECTOMY;  Surgeon: YMarybelle Killings MD;  [place]  Service: Orthopedics;  Laterality: Left;   KNEE SURGERY  6-762yrago   left   LEFT HEART CATH AND CORONARY ANGIOGRAPHY N/A 09/19/2016   Procedure: Left Heart  Cath and Coronary Angiography;  Surgeon: Belva Crome, MD;  Location: [place] CV LAB;  Service: Cardiovascular;  Laterality: N/A;   Left inguinal hernia repair     as a child   NASAL SEPTOPLASTY W/ TURBINOPLASTY Bilateral 02/19/2014   Procedure: NASAL SEPTOPLASTY WITH BILATERAL TURBINATE REDUCTION;  Surgeon: Jodi Marble, MD;  Location: [place];  Service: ENT;  Laterality: Bilateral;   NECK SURGERY  31yr ago   right shoulder surgery  4-563yrago   cyst removed   TEE WITHOUT  CARDIOVERSION  05/05/2011   Procedure: TRANSESOPHAGEAL ECHOCARDIOGRAM (TEE);  Surgeon: BrLelon PerlaMD;  [facility]  Service: Cardiovascular;  Laterality: N/A;   TEE WITHOUT CARDIOVERSION N/A 04/21/2015   Procedure: TRANSESOPHAGEAL ECHOCARDIOGRAM (TEE);  Surgeon: TrSueanne MargaritaMD;  [facility];  Service: Cardiovascular;  Laterality: N/A;   THROAT SURGERY  4-5y65yrgo   "thought " it was cancer but came back not   TONSILLECTOMY     Patient Active Problem List   Diagnosis Date Noted   Abdominal discomfort 09/26/2021   Hepatomegaly 09/26/2021   Splenomegaly 09/26/2021   Atrophy of muscle of multiple sites 08/12/2021   Inflammatory polyarthropathy (HCCTriangle3/30/2023   Chronic pain syndrome 08/12/2021   Depression, major, single episode, moderate (HCCFalconer3/30/2023   Unilateral primary osteoarthritis, left knee 06/08/2021   Acute medial meniscal tear, left, initial encounter    Chronic meniscal tear of knee 03/15/2021   Dysphagia 04/17/2020   LVH (left ventricular hypertrophy) 11/07/2019   Heme positive stool 10/03/2019   Anemia 10/03/2019   Insulin dependent type 2 diabetes mellitus (HCCBurke4/04/2020   Acute on chronic diastolic heart failure (HCCLexa4/04/2020   Rash and nonspecific skin eruption 08/26/2019   Foot pain 08/22/2019   Right heart failure (HCCEast Rockaway3/24/2021   Leg swelling 07/28/2019   Educated about COVID-19 virus infection 07/28/2019   Diabetes (HCCJoseph City1/27/2021   Urticaria 05/15/2019   Dermatitis due to drug 04/18/2019   Edema 03/29/2019   Erectile dysfunction 03/29/2019   High risk medication use 03/29/2019   History of iron deficiency 03/29/2019   Pulmonary hypertension (HCCTanglewilde1/13/2020   Venous stasis dermatitis of both lower extremities 03/29/2019   Mixed hyperlipidemia 02/14/2019   Onychomycosis due to dermatophyte 11/06/2018   Hammer toe of right foot 07/17/2018   Tinea pedis of both feet 07/17/2018   History of radiofrequency ablation  (RFA) procedure for cardiac arrhythmia 06/21/2018   Lumbar back pain with radiculopathy affecting lower extremity 06/21/2018   Hepatic cirrhosis (HCCEvans8/13/2019   Left lower quadrant pain 12/05/2017   Change in bowel habits 12/05/2017   Palpitations 10/03/2017   SOB (shortness of breath) 04/12/2017   Chest pain with moderate risk of acute coronary syndrome 08/28/2016   Unstable angina (HCCDaggett4/13/2018   Longstanding persistent atrial fibrillation (HCCGoodyear2/10/2014   Deviated nasal septum 02/19/2014   History of colon polyps 11/22/2012   Syncope 10/17/2012   Acute asthmatic bronchitis 11/22/2011   Benign neoplasm of colon 10/13/2011   Unspecified gastritis and gastroduodenitis without mention of hemorrhage 10/13/2011   GERD (gastroesophageal reflux disease) 10/13/2011   Body mass index (BMI) of 31.0-31.9 in adult 09/30/2011   Dyslipidemia 06/23/2011   Neuropathy 06/23/2011   Other fatigue 05/13/2011   Malaise and fatigue 05/13/2011   Rectal bleeding 04/18/2011   Atrial flutter (HCCNightmute2/07/2010   Chronic anticoagulation 11/10/2010   PAF (paroxysmal atrial fibrillation) (HCC)    Tobacco abuse    COPD (chronic obstructive pulmonary disease) (  [place]) 02/11/2010   Essential hypertension 01/28/2010   OSA (obstructive sleep apnea) 01/28/2010    REFERRING DIAG: M62.59 (ICD-10-CM) - Atrophy of muscle of multiple sites  THERAPY DIAG:  No diagnosis found.  Rationale for Evaluation and Treatment Rehabilitation  PERTINENT HISTORY: HTN, Afib, unstable angina, CHF, pulmonary HTN, COPD, DM II, Neuropathy, depression  PRECAUTIONS: None  SUBJECTIVE: ***  PAIN:  Are you having pain?  Yes: NPRS scale: ***/10 (10/10 at worst) Pain location: L calf/heel Pain description: sharp Aggravating factors: standing, waking Relieving factors: rest, medication   Are you having pain?  Yes: NPRS scale: ***/10 7/10 at worst) Pain location: bilateral shoulders Pain description: dull ache Aggravating  factors: lifting, reaching Relieving factors: rest, medication   Are you having pain?  Yes: NPRS scale: ***/10 (10/10 at worst) Pain location: lower back Pain description: dull ache Aggravating factors: bending, twisting, standing, walking Relieving factors: rest, medication   OBJECTIVE: (objective measures completed at initial evaluation unless otherwise dated)   VITALS:           BP: 138/65 - sitting (Left Arm)           SpO2: 96% on room air   DIAGNOSTIC FINDINGS:            N/A   PATIENT SURVEYS:  FOTO - will assess next session 10/02/2021: ***%   COGNITION:           Overall cognitive status: Within functional limits for tasks assessed                          SENSATION: Light touch: Impaired - distal LE   POSTURE:  Medium body habitus, slumped sitting posture, fwd head, increased thoracic kyphosis    PALPATION: TTP to bilateral teres minor, supraspinatus    UE MMT:            MMT Right 09/22/21   Left  09/22/21   Shoulder flexion 3+/5 3+/5  Shoulder abduction 2+/5 2+/5  Shoulder ER 3+/5 3+/5  Shoulder IR 3+/5 3+/5  Middle trapezius      Lower trapezius      Shoulder extension      Grip strength 66# 70#  Cervical flexion (C1,C2)      Cervical S/B (C3)      Shoulder shrug (C4)      Elbow flexion (C6)      Elbow ext (C7)      Thumb ext (C8)      Finger abd (T1)      (Blank rows = not tested)   LE MMT:   MMT Right 09/22/2021 Left 09/22/2021  Hip flexion  2+/5 2+/5  Hip extension      Hip abduction 2+/5 2+/5  Hip adduction 2+/5 2+/5  Hip external rotation      Hip internal rotation      Knee extension 3/5 3/5  Knee flexion 3/5 3/5  Ankle dorsiflexion       Ankle plantarflexion      Ankle inversion      Ankle eversion      (Blank rows = not tested)   LOWER EXTREMITY SPECIAL TESTS:  N/A   FUNCTIONAL TESTS:  Timed up and go (TUG): 28 seconds with SPC - CGA 10/02/2021: 5xSTS *** seconds   GAIT: Distance walked: 58f Assistive device  utilized: Single point cane Level of assistance: Min A Comments: unsteady gait, decreased gait speed, decreased trunk ext, antalgic gait L, scissoring  TODAY'S TREATMENT: [facility] Adult PT Treatment:                                                DATE: 10/02/2021 Vitals beginning of session: *** HR, *** BP, *** O2 sats Therapeutic Exercise: 5xSTS *** seconds Seated marching GTB Seated clamshell GTB Seated hamstring curl GTB Seated heel raises Seated adduction ball squeeze  LAQ Rows RTB Shoulder extension RTB Manual Therapy: *** Neuromuscular re-ed: *** Therapeutic Activity: Administration and discussion of FOTO Modalities: *** Self Care: ***   [facility] Adult PT Treatment:                                                DATE: 09/22/2021 Therapeutic Exercise: Seated ball squeeze x 5 - 5" hold Seated clamshell x 10 GTG Seated scap retraction x 10 Seated row x 10 RTB   PATIENT EDUCATION:  Education details: eval findings, HEP, POC Person educated: Patient Education method: Explanation, Demonstration, and Handouts Education comprehension: verbalized understanding and returned demonstration     HOME EXERCISE PROGRAM: Access Code: [email or web address] URL: [email or web address] Date: 09/22/2021 Prepared by: Octavio Manns   Exercises - Seated Hip Adduction Squeeze with Diona Foley  - 1-2 x daily - 7 x weekly - 2 sets - 10 reps - 3 sec hold - Seated Hip Abduction with Resistance  - 1-2 x daily - 7 x weekly - 2 sets - 10 reps - green exercise band hold - Seated Scapular Retraction  - 1-2 x daily - 7 x weekly - 2 sets - 10 reps - 3 sec hold - Standing Shoulder Row with Anchored Resistance  - 1 x daily - 7 x weekly - 3 sets - 10 reps - red exercise band hold   ASSESSMENT:   CLINICAL IMPRESSION: ***  Patient is a 66 y.o. M who was seen today for physical therapy evaluation and treatment for multiple pain sites and chronic weakness and debility. Physical findings are consistent with  referring provider impression, as he demonstrates severe proximal hip weakness and gait deviations that place him at an increased risk for falls. His TUG time indicates severe fall risk and imbalance, indicating pt is operating below PLOF for short distance functional mobility. Pt would benefit from skilled PT services working on improving strength, mobility, and balance in order to improve safety and decrease pain/fall risk. PT will need to monitor pt's vitals due to complex PMH during sessions.      OBJECTIVE IMPAIRMENTS cardiopulmonary status limiting activity, decreased activity tolerance, decreased balance, decreased endurance, decreased mobility, difficulty walking, decreased ROM, decreased strength, impaired sensation, postural dysfunction, and pain.    ACTIVITY LIMITATIONS cleaning, community activity, driving, meal prep, occupation, and yard work.    PERSONAL FACTORS Fitness, Past/current experiences, Time since onset of injury/illness/exacerbation, and 3+ comorbidities: HTN, Afib, unstable angina, CHF, pulmonary HTN, COPD, DM II, Neuropathy, depression  are also affecting patient's functional outcome.      REHAB POTENTIAL: Fair - progress may be limited secondary to medical complexity   CLINICAL DECISION MAKING: Unstable/unpredictable   EVALUATION COMPLEXITY: High     GOALS: Goals reviewed with patient? No   SHORT TERM GOALS: Target date: 10/13/2021    Pt will be compliant and knowledgeable with initial  HEP for improved comfort and carryover Baseline: initial HEP given  Goal status: INITIAL   2.  Pt will self report LE and lower back pain no greater than 7/10 for improved comfort and functional ability Baseline: 10/10 at worst Goal status: INITIAL   3.  Pt will perform FOTO for assessment of subjective functional ability and balance Baseline:  Goal status: INITIAL   LONG TERM GOALS: Target date: 11/17/2021     Pt will self report LE and lower back pain no greater than 7/10  for improved comfort and functional ability Baseline: 10/10 at worst Goal status: INITIAL   2.  Pt will decrease Five Time Sit to Stand time by [place] in order to improve functional mobility and safety Baseline: will perform next session Goal status: INITIAL   3.  Pt will improve LE strength to no less than 3+/5 for all tested motions for improved functional mobility Baseline: see chart Goal status: INITIAL   4.  Pt will decrease TUG to no greater than 22 seconds with LRAD and no LoB in order to improve balance, stability, and safety Baseline: 28" with SPC - Min A Goal status: INITIAL     PLAN: PT FREQUENCY: 1-2x/week   PT DURATION: 8 weeks   PLANNED INTERVENTIONS: Therapeutic exercises, Therapeutic activity, Neuromuscular re-education, Balance training, Gait training, Patient/Family education, Joint mobilization, Electrical stimulation, Cryotherapy, Moist heat, and Manual therapy   PLAN FOR NEXT SESSION: take vitals, 5 time sit to stand, FOTO, seated strengthening    Evelene Croon, PTA 10/02/2021, 7:55 AM

## 2021-10-05 ENCOUNTER — Encounter: Payer: Self-pay | Admitting: Orthopaedic Surgery

## 2021-10-05 ENCOUNTER — Ambulatory Visit: Payer: Self-pay

## 2021-10-05 ENCOUNTER — Ambulatory Visit (INDEPENDENT_AMBULATORY_CARE_PROVIDER_SITE_OTHER): Payer: Commercial Managed Care - HMO | Admitting: Orthopaedic Surgery

## 2021-10-05 VITALS — BP 144/71 | HR 80 | Ht 74.0 in | Wt 233.0 lb

## 2021-10-05 DIAGNOSIS — M25572 Pain in left ankle and joints of left foot: Secondary | ICD-10-CM | POA: Diagnosis not present

## 2021-10-05 DIAGNOSIS — M7022 Olecranon bursitis, left elbow: Secondary | ICD-10-CM

## 2021-10-06 ENCOUNTER — Ambulatory Visit: Payer: Commercial Managed Care - HMO

## 2021-10-06 LAB — URIC ACID: Uric Acid, Serum: 11.8 mg/dL — ABNORMAL HIGH (ref 4.0–8.0)

## 2021-10-06 NOTE — Progress Notes (Signed)
Office Visit Note   Patient: [name]           Date of Birth: April 07, 1956           MRN: [id number] Visit Date: 10/05/2021              Requested by: Ronnell Freshwater, NP [place],  [place] [address] PCP: Ronnell Freshwater, NP   Assessment & Plan: Visit Diagnoses:  1. Pain in left ankle and joints of left foot   2. Olecranon bursitis of left elbow     Plan: We will check a uric acid I expect his uric acid is elevated and he needs started on some allopurinol low-dose 100 mg daily for a week or 2 and then gradually increase to 200 mg for a week or 2 and then go up to 300 mg.  With his diabetes is probably best avoided prednisone treatment if he has a flare of his gout as we gradually have his allopurinol increased.  He can take colchicine.  We discussed sometimes gout flares and uric acid can increase as allopurinol was started.  Discussed with them that with lowering of his uric acid down to low normal amount he should get resolution of the gout tophi deposits.  He can follow-up with me as necessary.  Follow-Up Instructions: No follow-ups on file.   Orders:  Orders Placed This Encounter  Procedures   XR Ankle Complete Left   Uric acid   No orders of the defined types were placed in this encounter.     Procedures: No procedures performed   Clinical Data: No additional findings.   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  Previous slight aspiration and injection.  He has not had recent uric acid but I would expect it likely is high.  He has had pain in his ankle off and on for a year sometimes difficulty walking.  Additional problems with hammertoe, knee pain.  History of right-sided heart failure.  Previous cardiac RFA.  Type 2 diabetes last A1c 7.6.  Review of Systems all the systems  noncontributory to HPI.   Objective: Vital Signs: BP (!) 144/71   Pulse 80   Ht '6\' 2"'$  (1.88 m)   Wt 233 lb (105.7 kg)   BMI 29.92 kg/m   Physical Exam Constitutional:      Appearance: He is well-developed.  HENT:     Head: Normocephalic and atraumatic.     Right Ear: External ear normal.     Left Ear: External ear normal.  Eyes:     Pupils: Pupils are equal, round, and reactive to light.  Neck:     Thyroid: No thyromegaly.     Trachea: No tracheal deviation.  Cardiovascular:     Rate and Rhythm: Normal rate.  Pulmonary:     Effort: Pulmonary effort is normal.     Breath sounds: No wheezing.  Abdominal:     General: Bowel sounds are normal.     Palpations: Abdomen is soft.  Musculoskeletal:     Cervical back: Neck supple.  Skin:    General: Skin is warm and dry.     Capillary Refill: Capillary refill takes less than 2 seconds.  Neurological:     Mental Status: He is alert and oriented to  person, place, and time.  Psychiatric:        Behavior: Behavior normal.        Thought Content: Thought content normal.        Judgment: Judgment normal.    Ortho Exam 3 x 2 cm olecranon bursa mass left elbow which is tender.  Callus over it.  Has pain with pressure.  Specialty Comments:  No specialty comments available.  Imaging: No results found.   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   Chronic pain syndrome 08/12/2021   Depression, major, single episode, moderate (HCC) 08/12/2021   Unilateral primary osteoarthritis, left knee 06/08/2021   Acute medial meniscal tear, left, initial encounter    Chronic meniscal tear of knee 03/15/2021   Dysphagia 04/17/2020   LVH (left ventricular hypertrophy) 11/07/2019   Heme positive stool 10/03/2019   Anemia 10/03/2019    Insulin dependent type 2 diabetes mellitus ([place]) 08/26/2019   Acute on chronic diastolic heart failure ([place]) 08/26/2019   Rash and nonspecific skin eruption 08/26/2019   Foot pain 08/22/2019   Right heart failure ([place]) 08/07/2019   Leg swelling 07/28/2019   Educated about COVID-19 virus infection 07/28/2019   Diabetes ([place]) 06/12/2019   Urticaria 05/15/2019   Dermatitis due to drug 04/18/2019   Edema 03/29/2019   Erectile dysfunction 03/29/2019   High risk medication use 03/29/2019   History of iron deficiency 03/29/2019   Pulmonary hypertension ([place]) 03/29/2019   Venous stasis dermatitis of both lower extremities 03/29/2019   Mixed hyperlipidemia 02/14/2019   Onychomycosis due to dermatophyte 11/06/2018   Hammer toe of right foot 07/17/2018   Tinea pedis of both feet 07/17/2018   History of radiofrequency ablation (RFA) procedure for cardiac arrhythmia 06/21/2018   Lumbar back pain with radiculopathy affecting lower extremity 06/21/2018   Hepatic cirrhosis ([place]) 12/26/2017   Left lower quadrant pain 12/05/2017   Change in bowel habits 12/05/2017   Palpitations 10/03/2017   SOB (shortness of breath) 04/12/2017   Chest pain with moderate risk of acute coronary syndrome 08/28/2016   Unstable angina (HCC) 08/26/2016   Longstanding persistent atrial fibrillation ([place]) 04/21/2015   Deviated nasal septum 02/19/2014   History of colon polyps 11/22/2012   Syncope 10/17/2012   Acute asthmatic bronchitis 11/22/2011   Benign neoplasm of colon 10/13/2011   Unspecified gastritis and gastroduodenitis without mention of hemorrhage 10/13/2011   GERD (gastroesophageal reflux disease) 10/13/2011   Body mass index (BMI) of 31.0-31.9 in adult 09/30/2011   Dyslipidemia 06/23/2011   Neuropathy 06/23/2011   Other fatigue 05/13/2011   Malaise and fatigue 05/13/2011   Rectal bleeding 04/18/2011   Atrial flutter ([place]) 04/18/2011   Chronic anticoagulation 11/10/2010   PAF (paroxysmal atrial  fibrillation) (HCC)    Tobacco abuse    COPD (chronic obstructive pulmonary disease) ([place]) 02/11/2010   Essential hypertension 01/28/2010   OSA (obstructive sleep apnea) 01/28/2010   Past Medical History:  Diagnosis Date   Arthritis    lower back   Asthma    Atrial fibrillation [facility])    Atrial flutter ([place])    s/p CTI ablation by Dr Rayann Heman   Brain aneurysm 2009   questionable. A follow up CTA in 2009 showed no evidence of   Chronic back pain    DDD/stenosis   Colon polyps  9 polyps removed 07/29/38   Complication of anesthesia 09/11/2012   slow to awaken after ablation   COPD (chronic obstructive pulmonary disease) ([place])    Diabetes mellitus    takes Metformin and Glimepiride daily   Emphysema    GERD (gastroesophageal reflux disease)    takes Omeprazole bid   Heart failure (HCC)    History of shingles    HLD (hyperlipidemia)    takes Pravastatin daily   HTN (hypertension)    takes Prinizide daily   Obesity    OSA (obstructive sleep apnea)    not always using cpap   Overdose 2009   unintentional Flecanide overdose   Peripheral neuropathy    Short-term memory loss    Tobacco abuse     Family History  Problem Relation Age of Onset   Diabetes Mother    Heart disease Father    Lung cancer Father    Diabetes Maternal Grandmother    Colon cancer Neg Hx    Anesthesia problems Neg Hx    Hypotension Neg Hx    Malignant hyperthermia Neg Hx    Pseudochol deficiency Neg Hx    Esophageal cancer Neg Hx    Pancreatic cancer Neg Hx    Stomach cancer Neg Hx     Past Surgical History:  Procedure Laterality Date   ANTERIOR CERVICAL DECOMP/DISCECTOMY FUSION  01/04/2012   Procedure: ANTERIOR CERVICAL DECOMPRESSION/DISCECTOMY FUSION 1 LEVEL/HARDWARE REMOVAL;  Surgeon: Ophelia Charter, MD;  Location: [facility]URO ORS;  Service: Neurosurgery;  Laterality: N/A;  explore cervical fusion Cervical six - seven  with removal of codman plate anterior cervical decompression with fusion  interbody prothesis plating and bonegraft   APPENDECTOMY  2-52yr ago   AArkoeN/A 09/11/2012   PT DID NOT HAVE AN ATRIAL FIBRILLATION ABLATION IN 2014!  ATRIAL FLUTTER ABLATION ONLY   ATRIAL FLUTTER ABLATION  09/11/2012   CTI ablation by Dr ARayann Heman  BIOPSY  11/07/2019   Procedure: BIOPSY;  Surgeon: JMilus Banister MD;  [facility];  Service: Endoscopy;;   CARDIAC CATHETERIZATION  2008   no significant CAD   CARDIOVERSION  05/05/2011   Procedure: CARDIOVERSION;  Surgeon: BLelon Perla MD;  [facility]  Service: Cardiovascular;  Laterality: N/A;   CARDIOVERSION Bilateral 07/26/2012   Procedure: CARDIOVERSION;  Surgeon: JMinus Breeding MD;  [facility];  Service: Cardiovascular;  Laterality: Bilateral;   CARPAL TUNNEL RELEASE  99/2000   bilateral   COLONOSCOPY WITH PROPOFOL N/A 12/13/2012   Procedure: COLONOSCOPY WITH PROPOFOL;  Surgeon: DMilus Banister MD;  [facility];  Service: Endoscopy;  Laterality: N/A;   ELECTROPHYSIOLOGIC STUDY N/A 04/21/2015   Procedure: Atrial Fibrillation Ablation;  Surgeon: JThompson Grayer MD;  [facility];  Service: Cardiovascular;  Laterality: N/A;   ESOPHAGOGASTRODUODENOSCOPY (EGD) WITH PROPOFOL N/A 11/07/2019   Procedure: ESOPHAGOGASTRODUODENOSCOPY (EGD) WITH PROPOFOL;  Surgeon: JMilus Banister MD;  [facility];  Service: Endoscopy;  Laterality: N/A;   HERNIA REPAIR     KNEE ARTHROSCOPY WITH MEDIAL MENISECTOMY Left 04/23/2021   Procedure: LEFT KNEE ARTHROSCOPY WITH PARTIAL MEDIAL MENISCECTOMY;  Surgeon: YMarybelle Killings MD;  [facility]  Service: Orthopedics;  Laterality: Left;   KNEE SURGERY  6-726yrago   left   LEFT HEART CATH AND CORONARY ANGIOGRAPHY N/A 09/19/2016   Procedure: Left Heart Cath and Coronary Angiography;  Surgeon: SmBelva CromeMD;  [facility];  Service: Cardiovascular;  Laterality: N/A;  Left inguinal hernia repair     as a  child   NASAL SEPTOPLASTY W/ TURBINOPLASTY Bilateral 02/19/2014   Procedure: NASAL SEPTOPLASTY WITH BILATERAL TURBINATE REDUCTION;  Surgeon: Jodi Marble, MD;  Location: [facility];  Service: ENT;  Laterality: Bilateral;   NECK SURGERY  33yr ago   right shoulder surgery  4-556yrago   cyst removed   TEE WITHOUT CARDIOVERSION  05/05/2011   Procedure: TRANSESOPHAGEAL ECHOCARDIOGRAM (TEE);  Surgeon: BrLelon PerlaMD;  [facility];  Service: Cardiovascular;  Laterality: N/A;   TEE WITHOUT CARDIOVERSION N/A 04/21/2015   Procedure: TRANSESOPHAGEAL ECHOCARDIOGRAM (TEE);  Surgeon: TrSueanne MargaritaMD;  [facility];  Service: Cardiovascular;  Laterality: N/A;   THROAT SURGERY  [id number]   "thought " it was cancer but came back not   TONSILLECTOMY     Social History   Occupational History   Occupation: MecDealerTobacco Use   Smoking status: Every Day    Packs/day: 1.00    Years: 49.00    Pack years: 49.00    Types: Cigarettes   Smokeless tobacco: Never   Tobacco comments:    Smoking a pack per day as of 04/19/21 hfb  Vaping Use   Vaping Use: Never used  Substance and Sexual Activity   Alcohol use: Not Currently    Alcohol/week: 0.0 standard drinks    Comment: 12/05/17 pt stated he has drinked 1/2 drink in 3 years   Drug use: No   Sexual activity: Yes

## 2021-10-06 NOTE — Progress Notes (Signed)
Will need referral to GI based on enlarged liver and spleen. Moderate amount sludge in gallbladder.

## 2021-10-07 ENCOUNTER — Telehealth: Payer: Self-pay | Admitting: Nurse Practitioner

## 2021-10-07 NOTE — Telephone Encounter (Signed)
Patient requesting a refill of his gout medication. Please advise.

## 2021-10-13 ENCOUNTER — Ambulatory Visit: Payer: Commercial Managed Care - HMO

## 2021-10-13 ENCOUNTER — Emergency Department (HOSPITAL_COMMUNITY)
Admission: EM | Admit: 2021-10-13 | Discharge: 2021-10-13 | Disposition: A | Discharge status: Home / Self Care | Payer: Commercial Managed Care - HMO | Attending: Emergency Medicine | Admitting: Emergency Medicine

## 2021-10-13 ENCOUNTER — Other Ambulatory Visit: Payer: Self-pay | Admitting: Nurse Practitioner

## 2021-10-13 ENCOUNTER — Other Ambulatory Visit: Payer: Self-pay

## 2021-10-13 ENCOUNTER — Encounter (HOSPITAL_COMMUNITY): Payer: Self-pay

## 2021-10-13 DIAGNOSIS — M10029 Idiopathic gout, unspecified elbow: Secondary | ICD-10-CM

## 2021-10-13 DIAGNOSIS — J209 Acute bronchitis, unspecified: Secondary | ICD-10-CM

## 2021-10-13 DIAGNOSIS — Z720 Tobacco use: Secondary | ICD-10-CM

## 2021-10-13 DIAGNOSIS — F17210 Nicotine dependence, cigarettes, uncomplicated: Secondary | ICD-10-CM | POA: Diagnosis not present

## 2021-10-13 DIAGNOSIS — R0602 Shortness of breath: Secondary | ICD-10-CM | POA: Diagnosis present

## 2021-10-13 MED ORDER — ALBUTEROL SULFATE HFA 108 (90 BASE) MCG/ACT IN AERS
2.0000 | INHALATION_SPRAY | Freq: Once | RESPIRATORY_TRACT | Status: AC
Start: 2021-10-13 — End: 2021-10-13
  Administered 2021-10-13: 2 via RESPIRATORY_TRACT
  Filled 2021-10-13: qty 6.7

## 2021-10-13 MED ORDER — COLCHICINE 0.6 MG PO TABS: ORAL_TABLET | ORAL | 2 refills | Status: DC

## 2021-10-13 MED ORDER — PREDNISONE 20 MG PO TABS
10.0000 mg | ORAL_TABLET | Freq: Once | ORAL | Status: AC
  Administered 2021-10-13: 10 mg via ORAL

## 2021-10-13 MED ORDER — PREDNISONE 20 MG PO TABS
10.0000 mg | ORAL_TABLET | Freq: Every day | ORAL | Status: DC
  Filled 2021-10-13: qty 1

## 2021-10-13 MED ORDER — PREDNISONE 10 MG PO TABS: ORAL_TABLET | ORAL | 0 refills | Status: DC

## 2021-10-13 MED ORDER — AMOXICILLIN-POT CLAVULANATE 875-125 MG PO TABS
1.0000 | ORAL_TABLET | Freq: Once | ORAL | Status: AC
  Administered 2021-10-13: 1 via ORAL
  Filled 2021-10-13: qty 1

## 2021-10-13 MED ORDER — AMOXICILLIN-POT CLAVULANATE 875-125 MG PO TABS: 1.0000 | ORAL_TABLET | Freq: Two times a day (BID) | ORAL | 0 refills | Status: DC

## 2021-10-13 NOTE — Discharge Instructions (Addendum)
Use the albuterol inhaler 2 puffs every 4 hours as needed for cough or trouble breathing.  Start the antibiotic prescription prednisone, tomorrow.  Follow-up with the pain doctor, tomorrow as scheduled.

## 2021-10-13 NOTE — Telephone Encounter (Signed)
I will contact the pt concerning his gout med

## 2021-10-13 NOTE — Telephone Encounter (Signed)
Patient called into the office today and was checking his medication for gout. The message has not been responded to.  Please Advise!!

## 2021-10-13 NOTE — Telephone Encounter (Signed)
Please let the patient know that I have started him on colchicine, first to treat current flare, then to prevent further flares. He should Take 2 tablet po once. Then take 1 tablet po one hour later. Wait 2 days then begin taking 1 tablet po QD to prevent acute gout. These instructions should also be printed on the prescription bottle. This was [facility]. Thanks so much.   -HB

## 2021-10-13 NOTE — ED Provider Notes (Signed)
[facility] DEPT Provider Note   CSN: [id number] Arrival date & time: 10/13/21  2046     History  Chief Complaint  Patient presents with   Atrial Fibrillation    [name] is a 66 y.o. male.  HPI He presents for evaluation of shortness of breath, and cough productive of discolored sputum.  He is using his albuterol inhaler, periodically but thinks it is almost out.  He continues to smoke cigarettes.  He denies fever, chills, weakness or dizziness.  He continues to work, doing heavy Human resources officer.  Patient's son took me aside and stated that he thinks his father uses muscle relaxers and narcotics, inappropriately.  He thinks his father has multiple bottles of pain medication.  Patient states he is due to see a pain management doctor tomorrow after not seeing one or having any narcotics for 2 months.    Home Medications Prior to Admission medications   Medication Sig Start Date End Date Taking? Authorizing Provider  amoxicillin-clavulanate (AUGMENTIN) 875-125 MG tablet Take 1 tablet by mouth every 12 (twelve) hours. 10/13/21  Yes Daleen Bo, MD  predniSONE (DELTASONE) 10 MG tablet 1 twice daily until gone 10/13/21  Yes Daleen Bo, MD  albuterol [facility] HFA) 108 (90 Base) MCG/ACT inhaler Inhale 1-2 puffs into the lungs every 6 (six) hours as needed for wheezing or shortness of breath. 10/15/20   Parrett, Fonnie Mu, NP  b complex vitamins tablet Take 1 tablet by mouth daily.     [provider]  bisoprolol (ZEBETA) 10 MG tablet Take 10 mg by mouth daily.     [provider]  colchicine 0.6 MG tablet Take 2 tablet po once. Then take 1 tablet po one hour later. Wait 2 days then begin taking 1 tablet po QD to prevent acute gout. 10/13/21   Ronnell Freshwater, NP  Continuous Blood Gluc Receiver ([place]) [place]  10/12/20   [provider]  Continuous Blood Gluc Sensor (DEXCOM G6 SENSOR) MISC 1 Device by Does not apply  route See admin instructions. Change every 10 days 10/12/20   Renato Shin, MD  Continuous Blood Gluc Transmit (DEXCOM G6 TRANSMITTER) MISC USE AS DIRECTED 04/12/21   Renato Shin, MD  cyclobenzaprine (FLEXERIL) 10 MG tablet Take 10 mg by mouth 3 (three) times daily. 08/09/21   [provider]  diltiazem (CARDIZEM CD) 120 MG 24 hr capsule Take 120 mg by mouth daily. 07/18/20   [provider]  Dulaglutide (TRULICITY) 3 OY/7.7AJ SOPN Inject 3 mg as directed once a week. 05/26/21   Renato Shin, MD  DULoxetine (CYMBALTA) 20 MG capsule Take 1 capsule (20 mg total) by mouth daily. 08/12/21   Ronnell Freshwater, NP  gabapentin (NEURONTIN) 300 MG capsule Take 300 mg by mouth See admin instructions. Take 1 capsule (300 mg) by mouth in the morning & take 3 capsules (900 mg) by mouth at night    [provider]  HUMALOG KWIKPEN 100 UNIT/ML KwikPen Inject 0-60 Units into the skin 3 (three) times daily with meals. 09/09/21   Ronnell Freshwater, NP  Insulin Pen Needle (EASY COMFORT PEN NEEDLES) 31G X 5 MM MISC To use with as needed insulin dosing up to four times daily as needed for hyperglycemia 09/09/21   Ronnell Freshwater, NP  losartan (COZAAR) 50 MG tablet TAKE 1 TABLET BY MOUTH DAILY 08/18/21   Ronnell Freshwater, NP  metolazone (ZAROXOLYN) 2.5 MG tablet TAKE 1 TABLET BY MOUTH ON  TUESDAY AND THURSDAY 30 MINUTES BEFORE TAKING TORSEMIDE OR AS DIRECTED 08/03/21   Minus Breeding, MD  Multiple Vitamin (MULTIVITAMIN WITH MINERALS) TABS Take 1 tablet by mouth daily.     [provider]  Multiple Vitamins-Minerals (ZINC PO) Take 25 mg by mouth daily.    [provider]  nitroGLYCERIN (NITROSTAT) 0.4 MG SL tablet Place 1 tablet (0.4 mg total) under the tongue every 5 (five) minutes x 3 doses as needed for chest pain. 06/28/21   [name], PA  omeprazole (PRILOSEC) 20 MG capsule Take 1 capsule (20 mg total) by mouth 2 (two) times daily. 03/18/21   Esterwood, Amy S, PA-C   oxyCODONE-acetaminophen (PERCOCET) 10-325 MG tablet Take 1.5 tablets by mouth in the morning, at noon, in the evening, and at bedtime. 03/10/20   [provider]  potassium chloride SA (KLOR-CON) 20 MEQ tablet TAKE 1 TABLET BY MOUTH DAILY. TAKE AN EXTRA TABLET ON TUESDAY AND THURSDAY Patient taking differently: 20 mEq See admin instructions. Take 1 tablet (20 meq) by mouth (scheduled) at night on Sundays, Mondays, Wednesdays, Fridays & Saturdays & take 2 tablets (40 meq) by mouth at night on Tuesdays & Thursdays 03/02/21   Minus Breeding, MD  pravastatin (PRAVACHOL) 80 MG tablet TAKE 1 TABLET BY MOUTH DAILY AT BEDTIME 04/04/18   Minus Breeding, MD  tamsulosin (FLOMAX) 0.4 MG CAPS capsule TAKE 1 CAPSULE BY MOUTH EACH NIGHT AT BEDTIME 07/15/21   McKenzie, Candee Furbish, MD  torsemide (DEMADEX) 20 MG tablet Take 2 tablets in the morning and 1 tablet in the PM 07/08/21   [name], PA  triamcinolone cream (KENALOG) 0.1 % Apply 1 application topically daily as needed. 12/19/19   Clark-Burning, Anderson Malta, PA-C  umeclidinium-vilanterol (ANORO ELLIPTA) 62.5-25 MCG/INH AEPB Inhale 1 puff into the lungs daily. Patient taking differently: Inhale 1 puff into the lungs daily as needed (respiratory issues.). 10/15/20   Parrett, Fonnie Mu, NP  VITAMIN A PO Take 2,400 mcg by mouth daily.     [provider]  Vitamin D, Ergocalciferol, (DRISDOL) 1.25 MG (50000 UT) CAPS capsule Take 50,000 Units by mouth every Friday.    [provider]  warfarin (COUMADIN) 5 MG tablet TAKE 1 TO 1 AND 1/2 TABLETS BY MOUTH DAILY AS DIRECTED BY THE COUMADIN CLINIC 08/03/21   Minus Breeding, MD      Allergies    Adhesive [tape] and Latex    Review of Systems   Review of Systems  Physical Exam Updated Vital Signs BP 111/62   Pulse 68   Temp 98.1 F (36.7 C) (Oral)   Resp 15   Ht '6\' 2"'$  (1.88 m)   Wt 105.2 kg   SpO2 92%   BMI 29.79 kg/m  Physical Exam Vitals and nursing note reviewed.  Constitutional:       General: He is not in acute distress.    Appearance: He is well-developed. He is not ill-appearing, toxic-appearing or diaphoretic.  HENT:     Head: Normocephalic and atraumatic.     Right Ear: External ear normal.     Left Ear: External ear normal.  Eyes:     Conjunctiva/sclera: Conjunctivae normal.     Pupils: Pupils are equal, round, and reactive to light.  Neck:     Trachea: Phonation normal.  Cardiovascular:     Rate and Rhythm: Normal rate and regular rhythm.  Pulmonary:     Effort: Pulmonary effort is normal. No respiratory distress.     Breath sounds: No stridor.  Abdominal:     General: There is no distension.     Palpations: Abdomen is soft.  Musculoskeletal:        General: Normal range of motion.     Cervical back: Normal range of motion and neck supple.  Skin:    General: Skin is warm and dry.  Neurological:     Mental Status: He is alert and oriented to person, place, and time.     Cranial Nerves: No cranial nerve deficit.     Sensory: No sensory deficit.     Motor: No abnormal muscle tone.     Coordination: Coordination normal.  Psychiatric:        Mood and Affect: Mood normal.        Behavior: Behavior normal.        Thought Content: Thought content normal.        Judgment: Judgment normal.    ED Results / Procedures / Treatments   Labs (all labs ordered are listed, but only abnormal results are displayed) Labs Reviewed - No data to display  EKG EKG Interpretation  Date/Time:  Wednesday Oct 13 2021 21:01:47 EDT Ventricular Rate:  79 PR Interval:    QRS Duration: 100 QT Interval:  420 QTC Calculation: 482 R Axis:   49 Text Interpretation: Atrial fibrillation Borderline repolarization abnormality Borderline prolonged QT interval Since last tracing now in Atrial fibrillation Otherwise no significant change Confirmed by Daleen Bo [phone]) on 10/13/2021 9:27:24 PM  Radiology No results found.  Procedures Procedures    Medications Ordered in  ED Medications  albuterol (VENTOLIN HFA) 108 (90 Base) MCG/ACT inhaler 2 puff (2 puffs Inhalation Given 10/13/21 2207)  amoxicillin-clavulanate (AUGMENTIN) 875-125 MG per tablet 1 tablet (1 tablet Oral Given 10/13/21 2205)  predniSONE (DELTASONE) tablet 10 mg (10 mg Oral Given 10/13/21 2211)    ED Course/ Medical Decision Making/ A&P                           Medical Decision Making Patient with a history of COPD presenting with signs symptoms of acute bronchitis.  No symptoms for pneumonia, heart failure, sepsis or signs of hemodynamic instability.  Patient is an ongoing cigarette smoker.  Problems Addressed: Acute bronchitis, unspecified organism: complicated acute illness or injury Tobacco abuse: chronic illness or injury  Amount and/or Complexity of Data Reviewed ECG/medicine tests: ordered and independent interpretation performed.    Details: Cardiac monitor-atrial fibrillation, recurrent  Risk Prescription drug management. Decision regarding hospitalization. Risk Details: Patient presenting with exacerbation of chronic bronchitis, hemodynamically stable with normal oxygenation on room air.  He is nontoxic.  EKG does not indicate acute cardiac abnormality.  Patient does not require extensive work-up in the ED.  He does not require hospitalization.  Started albuterol and Augmentin in the ED.  Discharged with son in stable condition.           Final Clinical Impression(s) / ED Diagnoses Final diagnoses:  Acute bronchitis, unspecified organism  Tobacco abuse    Rx / DC Orders ED Discharge Orders          Ordered    amoxicillin-clavulanate (AUGMENTIN) 875-125 MG tablet  Every 12 hours        10/13/21 2130    predniSONE (DELTASONE) 10 MG tablet        10/13/21 2130              Daleen Bo, MD 10/13/21 2353

## 2021-10-13 NOTE — ED Notes (Signed)
Pt o2 95ra after ambulating to triage room went up to 97ra after sitting in triage chair for vitals

## 2021-10-13 NOTE — ED Triage Notes (Signed)
Pt states that he was non coherent today, EMS was called, they ran an EKG, and it was normal but his O2 was low. Pt states that he has been feeling a fluttery sensation in his heart since yesterday. Pt reports not getting any sleep due to pain.

## 2021-10-13 NOTE — Telephone Encounter (Signed)
Called pt spoke to wife she is advised of his Rx that was sent to pharmacy

## 2021-10-14 ENCOUNTER — Encounter: Payer: Self-pay | Admitting: Student in an Organized Health Care Education/Training Program

## 2021-10-14 ENCOUNTER — Ambulatory Visit
Payer: Commercial Managed Care - HMO | Attending: Student in an Organized Health Care Education/Training Program | Admitting: Student in an Organized Health Care Education/Training Program

## 2021-10-14 VITALS — BP 127/66 | HR 78 | Temp 97.7°F | Resp 18 | Ht 74.0 in | Wt 232.0 lb

## 2021-10-14 DIAGNOSIS — M48062 Spinal stenosis, lumbar region with neurogenic claudication: Secondary | ICD-10-CM | POA: Diagnosis present

## 2021-10-14 DIAGNOSIS — G8929 Other chronic pain: Secondary | ICD-10-CM | POA: Diagnosis present

## 2021-10-14 DIAGNOSIS — E1140 Type 2 diabetes mellitus with diabetic neuropathy, unspecified: Secondary | ICD-10-CM

## 2021-10-14 DIAGNOSIS — M25562 Pain in left knee: Secondary | ICD-10-CM | POA: Insufficient documentation

## 2021-10-14 DIAGNOSIS — G894 Chronic pain syndrome: Secondary | ICD-10-CM

## 2021-10-14 DIAGNOSIS — M1712 Unilateral primary osteoarthritis, left knee: Secondary | ICD-10-CM

## 2021-10-14 DIAGNOSIS — M5416 Radiculopathy, lumbar region: Secondary | ICD-10-CM

## 2021-10-14 DIAGNOSIS — Z981 Arthrodesis status: Secondary | ICD-10-CM

## 2021-10-14 NOTE — Progress Notes (Signed)
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 anterior/posterior knee pain that radiates to his foot. Patient reports left leg weakness, numbness and swelling of his knee x 6-8 months. History of left knee arthroscopic surgery. Patient reports previous steroid injections 2+ months with 1 day relief. No known injury.  EXAM: MRI OF THE LEFT KNEE WITHOUT CONTRAST  TECHNIQUE: Multiplanar, multisequence MR imaging of the knee was performed. No intravenous contrast was administered.  COMPARISON:  X-ray knee 10/22/2020.  FINDINGS: MENISCI  Medial meniscus: Complex degenerative-type tearing of the medial meniscus predominantly involving the body and posterior horn segments with horizontal component (series 9, images 15-21).  Lateral meniscus: Intact.  LIGAMENTS  Cruciates: Intact ACL and PCL.  Collaterals: Medial collateral ligament is intact. Lateral collateral ligament complex is intact.  CARTILAGE  Patellofemoral: Full-thickness cartilage loss involving  the mid to superior aspect of the lateral patellar facet. Partial-thickness cartilage loss within the lateral trochlea.  Medial: Mild diffuse cartilage thinning of the weight-bearing medial compartment.  Lateral: No chondral defect.  Joint: Small joint effusion. Loose body within the lateral aspect of the suprapatellar pouch measures approximately 2.4 x 1.7 cm. Fat pads within normal limits.  Popliteal Fossa: Large complex Baker's cyst measuring up to 9.5 cm in craniocaudal dimension. Intact popliteus tendon.  Extensor Mechanism: Distal quadriceps tendinosis. Intact patellar tendon.  Bones: Tricompartmental joint space narrowing with small marginal osteophyte formation. Degenerative subchondral marrow signal changes within the lateral patella. No fracture. No suspicious bone lesion.  Other: Mild diffuse subcutaneous edema, nonspecific.  IMPRESSION: 1. Tricompartmental osteoarthritis, most severe within the patellofemoral compartment. 2. Complex degenerative tearing of the medial meniscus. 3. Small joint effusion with loose body. 4. Large complex Baker's cyst. 5. Distal quadriceps tendinosis.   Electronically Signed By: Davina Poke D.O. On: 03/02/2021 16:31  Complexity  Note: Imaging results reviewed. Results shared with Mr. Tumminello, using Layman's terms.                         ROS  Cardiovascular: Heart trouble, Abnormal heart rhythm, and Blood thinners:  Anticoagulant Pulmonary or Respiratory: Lung problems and Coughing up mucus (Bronchitis) Neurological: No reported neurological signs or symptoms such as seizures, abnormal skin sensations, urinary and/or fecal incontinence, being born with an abnormal open spine and/or a tethered spinal cord Psychological-Psychiatric: Depressed Gastrointestinal: Reflux or heatburn Genitourinary: No reported renal or genitourinary signs or symptoms such as difficulty voiding or producing urine, peeing blood, non-functioning kidney,  kidney stones, difficulty emptying the bladder, difficulty controlling the flow of urine, or chronic kidney disease Hematological: No reported hematological signs or symptoms such as prolonged bleeding, low or poor functioning platelets, bruising or bleeding easily, hereditary bleeding problems, low energy levels due to low hemoglobin or being anemic Endocrine: High blood sugar requiring insulin (IDDM) Rheumatologic: No reported rheumatological signs and symptoms such as fatigue, joint pain, tenderness, swelling, redness, heat, stiffness, decreased range of motion, with or without associated rash Musculoskeletal: Negative for myasthenia gravis, muscular dystrophy, multiple sclerosis or malignant hyperthermia Work History: Working full time  Allergies  Mr. Virts is allergic to adhesive [tape] and latex.  Laboratory Chemistry Profile   Renal Lab Results  Component Value Date   BUN 18 08/12/2021   CREATININE 1.26 08/12/2021   BCR 14 08/12/2021   GFR 55.82 (L) 04/07/2020   GFRAA 69 05/07/2020   GFRNONAA >60 04/23/2021   PROTEINUR >300 (A) 03/06/2016     Electrolytes Lab Results  Component Value Date   NA 142 08/12/2021   K 3.5 08/12/2021   CL 98 08/12/2021   CALCIUM 8.9 08/12/2021   MG 1.8 08/26/2016     Hepatic Lab Results  Component Value Date   AST 27 09/21/2021   ALT 16 09/21/2021   ALBUMIN 4.2 09/21/2021   ALKPHOS 141 (H) 09/21/2021   AMYLASE 47 09/21/2021   LIPASE 32 09/21/2021     ID Lab Results  Component Value Date   HIV Non Reactive 08/26/2016   SARSCOV2NAA RESULT: NEGATIVE 11/04/2019   STAPHAUREUS NEGATIVE 04/23/2021   MRSAPCR NEGATIVE 04/23/2021     Bone No results found for: VD25OH, ST419QQ2WLN, LG9211HE1, DE0814GY1, 25OHVITD1, 25OHVITD2, 25OHVITD3, TESTOFREE, TESTOSTERONE   Endocrine Lab Results  Component Value Date   GLUCOSE 295 (H) 08/12/2021   GLUCOSEU NEGATIVE 03/06/2016   HGBA1C 7.6 (A) 07/29/2021   TSH 3.160 08/12/2021   FREET4 1.33  08/12/2021     Neuropathy Lab Results  Component Value Date   VITAMINB12 765 10/03/2019   FOLATE >23.7 10/03/2019   HGBA1C 7.6 (A) 07/29/2021   HIV Non Reactive 08/26/2016     CNS No results found for: COLORCSF, APPEARCSF, RBCCOUNTCSF, WBCCSF, POLYSCSF, LYMPHSCSF, EOSCSF, PROTEINCSF, GLUCCSF, JCVIRUS, CSFOLI, IGGCSF, LABACHR, ACETBL, LABACHR, ACETBL   Inflammation (CRP: Acute  ESR: Chronic) Lab Results  Component Value Date   ESRSEDRATE 39 (H) 08/12/2021   LATICACIDVEN 1.7 03/06/2016     Rheumatology Lab Results  Component Value Date   RF <10.0 08/12/2021   ANA Negative 08/12/2021   LABURIC 11.8 (H) 10/05/2021     Coagulation Lab Results  Component Value Date   INR 2.6 09/06/2021   LABPROT 15.1 04/23/2021   APTT 31 02/19/2014   PLT 125 (L) 08/12/2021     Cardiovascular Lab Results  Component Value Date   BNP 90.8 01/06/2021  CKTOTAL 38 (L) 01/24/2020   CKMB 1.6 04/10/2007   TROPONINI <0.03 08/27/2016   HGB 11.0 (L) 08/12/2021   HCT 32.9 (L) 08/12/2021     Screening Lab Results  Component Value Date   SARSCOV2NAA RESULT: NEGATIVE 11/04/2019   STAPHAUREUS NEGATIVE 04/23/2021   MRSAPCR NEGATIVE 04/23/2021   HIV Non Reactive 08/26/2016     Cancer No results found for: CEA, CA125, LABCA2   Allergens No results found for: ALMOND, APPLE, ASPARAGUS, AVOCADO, BANANA, BARLEY, BASIL, BAYLEAF, GREENBEAN, LIMABEAN, WHITEBEAN, BEEFIGE, REDBEET, BLUEBERRY, BROCCOLI, CABBAGE, MELON, CARROT, CASEIN, CASHEWNUT, CAULIFLOWER, CELERY     Note: Lab results reviewed.  PFSH  Drug: Mr. Wickens  reports no history of drug use. Alcohol:  reports that he does not currently use alcohol. Tobacco:  reports that he has been smoking cigarettes. He has a 49.00 pack-year smoking history. He has never used smokeless tobacco. Medical:  has a past medical history of Arthritis, Asthma, Atrial fibrillation ([place]), Atrial flutter ([place]), Brain aneurysm (2009), Chronic back pain, Colon polyps,  Complication of anesthesia (09/11/2012), COPD (chronic obstructive pulmonary disease) ([place]), Diabetes mellitus, Emphysema, GERD (gastroesophageal reflux disease), Heart failure ([place]), History of shingles, HLD (hyperlipidemia), HTN (hypertension), Obesity, OSA (obstructive sleep apnea), Overdose (2009), Peripheral neuropathy, Short-term memory loss, and Tobacco abuse. Family: family history includes Diabetes in his maternal grandmother and mother; Heart disease in his father; Lung cancer in his father.  Past Surgical History:  Procedure Laterality Date   ANTERIOR CERVICAL DECOMP/DISCECTOMY FUSION  01/04/2012   Procedure: ANTERIOR CERVICAL DECOMPRESSION/DISCECTOMY FUSION 1 LEVEL/HARDWARE REMOVAL;  Surgeon: Ophelia Charter, MD;  Location[facility];  Service: Neurosurgery;  Laterality: N/A;  explore cervical fusion Cervical six - seven  with removal of codman plate anterior cervical decompression with fusion interbody prothesis plating and bonegraft   APPENDECTOMY  2-88yr ago   AAlexisN/A 09/11/2012   PT DID NOT HAVE AN ATRIAL FIBRILLATION ABLATION IN 2014!  ATRIAL FLUTTER ABLATION ONLY   ATRIAL FLUTTER ABLATION  09/11/2012   CTI ablation by Dr ARayann Heman  BIOPSY  11/07/2019   Procedure: BIOPSY;  Surgeon: JMilus Banister MD;  [facility];  Service: Endoscopy;;   CARDIAC CATHETERIZATION  2008   no significant CAD   CARDIOVERSION  05/05/2011   Procedure: CARDIOVERSION;  Surgeon: BLelon Perla MD;  [facility]  Service: Cardiovascular;  Laterality: N/A;   CARDIOVERSION Bilateral 07/26/2012   Procedure: CARDIOVERSION;  Surgeon: JMinus Breeding MD;  [facility];  Service: Cardiovascular;  Laterality: Bilateral;   CARPAL TUNNEL RELEASE  99/2000   bilateral   COLONOSCOPY WITH PROPOFOL N/A 12/13/2012   Procedure: COLONOSCOPY WITH PROPOFOL;  Surgeon: DMilus Banister MD;  [facility];  Service: Endoscopy;  Laterality: N/A;    ELECTROPHYSIOLOGIC STUDY N/A 04/21/2015   Procedure: Atrial Fibrillation Ablation;  Surgeon: JThompson Grayer MD;  [facility];  Service: Cardiovascular;  Laterality: N/A;   ESOPHAGOGASTRODUODENOSCOPY (EGD) WITH PROPOFOL N/A 11/07/2019   Procedure: ESOPHAGOGASTRODUODENOSCOPY (EGD) WITH PROPOFOL;  Surgeon: JMilus Banister MD;  [facility];  Service: Endoscopy;  Laterality: N/A;   HERNIA REPAIR     KNEE ARTHROSCOPY WITH MEDIAL MENISECTOMY Left 04/23/2021   Procedure: LEFT KNEE ARTHROSCOPY WITH PARTIAL MEDIAL MENISCECTOMY;  Surgeon: YMarybelle Killings MD;  [facility] [facility]  Service: Orthopedics;  Laterality: Left;   KNEE SURGERY  [id number]   left   LEFT HEART CATH AND CORONARY ANGIOGRAPHY N/A 09/19/2016   Procedure: Left Heart  Cath and Coronary Angiography;  Surgeon: Belva Crome, MD;  Location: [place] [facility];  Service: Cardiovascular;  Laterality: N/A;   Left inguinal hernia repair     as a child   NASAL SEPTOPLASTY W/ TURBINOPLASTY Bilateral 02/19/2014   Procedure: NASAL SEPTOPLASTY WITH BILATERAL TURBINATE REDUCTION;  Surgeon: Jodi Marble, MD;  Location: [place];  Service: ENT;  Laterality: Bilateral;   NECK SURGERY  31yr ago   right shoulder surgery  [phone]   cyst removed   TEE WITHOUT CARDIOVERSION  05/05/2011   Procedure: TRANSESOPHAGEAL ECHOCARDIOGRAM (TEE);  Surgeon: BrLelon PerlaMD;  [facility];  Service: Cardiovascular;  Laterality: N/A;   TEE WITHOUT CARDIOVERSION N/A 04/21/2015   Procedure: TRANSESOPHAGEAL ECHOCARDIOGRAM (TEE);  Surgeon: TrSueanne MargaritaMD;  [facility];  Service: Cardiovascular;  Laterality: N/A;   THROAT SURGERY  [id number]   "thought " it was cancer but came back not   TONSILLECTOMY     Active Ambulatory Problems    Diagnosis Date Noted   Essential hypertension 01/28/2010   COPD (chronic obstructive pulmonary disease) (HCCGarvin9/29/2011   OSA (obstructive sleep apnea) 01/28/2010   PAF (paroxysmal atrial  fibrillation) (HCC)    Tobacco abuse    Chronic anticoagulation 11/10/2010   Rectal bleeding 04/18/2011   Atrial flutter (HCCSugarloaf2/07/2010   Other fatigue 05/13/2011   Dyslipidemia 06/23/2011   Neuropathy 06/23/2011   Body mass index (BMI) of 31.0-31.9 in adult 09/30/2011   Benign neoplasm of colon 10/13/2011   Unspecified gastritis and gastroduodenitis without mention of hemorrhage 10/13/2011   GERD (gastroesophageal reflux disease) 10/13/2011   Acute asthmatic bronchitis 11/22/2011   Syncope 10/17/2012   History of colon polyps 11/22/2012   Deviated nasal septum 02/19/2014   Longstanding persistent atrial fibrillation (HCCNewnan2/10/2014   Unstable angina (HCCMilo4/13/2018   Chest pain with moderate risk of acute coronary syndrome 08/28/2016   SOB (shortness of breath) 04/12/2017   Palpitations 10/03/2017   Left lower quadrant pain 12/05/2017   Change in bowel habits 12/05/2017   Hepatic cirrhosis (HCCBayou Gauche8/13/2019   Diabetes (HCCBuchanan Dam1/27/2021   Leg swelling 07/28/2019   Educated about COVID-19 virus infection 07/28/2019   Right heart failure (HCCBarnard3/24/2021   Foot pain 08/22/2019   Insulin dependent type 2 diabetes mellitus (HCCSpring City4/04/2020   Acute on chronic diastolic heart failure (HCCWeatherly4/04/2020   Rash and nonspecific skin eruption 08/26/2019   Heme positive stool 10/03/2019   Anemia 10/03/2019   LVH (left ventricular hypertrophy) 11/07/2019   Dermatitis due to drug 04/18/2019   Edema 03/29/2019   Erectile dysfunction 03/29/2019   Hammer toe of right foot 07/17/2018   High risk medication use 03/29/2019   History of iron deficiency 03/29/2019   History of radiofrequency ablation (RFA) procedure for cardiac arrhythmia 06/21/2018   Lumbar radiculopathy 06/21/2018   Malaise and fatigue 05/13/2011   Mixed hyperlipidemia 02/14/2019   Pulmonary hypertension (HCCSchaefferstown1/13/2020   Onychomycosis due to dermatophyte 11/06/2018   Tinea pedis of both feet 07/17/2018   Urticaria  05/15/2019   Venous stasis dermatitis of both lower extremities 03/29/2019   Dysphagia 04/17/2020   Chronic meniscal tear of knee 03/15/2021   Acute medial meniscal tear, left, initial encounter    Arthritis of left knee 06/08/2021   Atrophy of muscle of multiple sites 08/12/2021   Inflammatory polyarthropathy (HCCLake Winnebago3/30/2023   Chronic pain syndrome 08/12/2021   Depression, major, single episode, moderate (HCCNelsonville3/30/2023   Abdominal discomfort 09/26/2021  Hepatomegaly 09/26/2021   Splenomegaly 09/26/2021   Spinal stenosis, lumbar region, with neurogenic claudication 10/14/2021   S/P cervical spinal fusion 10/14/2021   Chronic pain of left knee 10/14/2021   Chronic painful diabetic neuropathy ([place]) 10/14/2021   Resolved Ambulatory Problems    Diagnosis Date Noted   DYSPNEA ON EXERTION 01/28/2010   Hemorrhoids 10/13/2011   GI bleeding 10/13/2011   Encounter for therapeutic drug monitoring 06/20/2013   Preop pulmonary/respiratory exam 04/19/2021   Past Medical History:  Diagnosis Date   Arthritis    Asthma    Atrial fibrillation ([place])    Brain aneurysm 2009   Chronic back pain    Colon polyps    Complication of anesthesia 09/11/2012   Diabetes mellitus    Emphysema    Heart failure ([place])    History of shingles    HLD (hyperlipidemia)    HTN (hypertension)    Obesity    Overdose 2009   Peripheral neuropathy    Short-term memory loss    Constitutional Exam  General appearance: Well nourished, well developed, and well hydrated. In no apparent acute distress Vitals:   10/14/21 1304  BP: 127/66  Pulse: 78  Resp: 18  Temp: 97.7 F (36.5 C)  SpO2: 97%  Weight: 232 lb (105.2 kg)  Height: 6' 2"  (1.88 m)   BMI Assessment: Estimated body mass index is 29.79 kg/m as calculated from the following:   Height as of this encounter: 6' 2"  (1.88 m).   Weight as of this encounter: 232 lb (105.2 kg).  BMI interpretation table: BMI level Category Range association with  higher incidence of chronic pain  <18 kg/m2 Underweight   18.5-24.9 kg/m2 Ideal body weight   25-29.9 kg/m2 Overweight Increased incidence by 20%  30-34.9 kg/m2 Obese (Class I) Increased incidence by 68%  35-39.9 kg/m2 Severe obesity (Class II) Increased incidence by 136%  >40 kg/m2 Extreme obesity (Class III) Increased incidence by 254%   Patient's current BMI Ideal Body weight  Body mass index is 29.79 kg/m. Ideal body weight: 82.2 kg (181 lb 3.5 oz) Adjusted ideal body weight: 91.4 kg (201 lb 8.5 oz)   BMI Readings from Last 4 Encounters:  10/14/21 29.79 kg/m  10/13/21 29.79 kg/m  10/05/21 29.92 kg/m  09/21/21 31.11 kg/m   Wt Readings from Last 4 Encounters:  10/14/21 232 lb (105.2 kg)  10/13/21 232 lb (105.2 kg)  10/05/21 233 lb (105.7 kg)  09/21/21 242 lb 6.4 oz (110 kg)    Psych/Mental status: Alert, oriented x 3 (person, place, & time)       Eyes: PERLA Respiratory: No evidence of acute respiratory distress  Cervical Spine Area Exam  Skin & Axial Inspection: Well healed scar from previous spine surgery detected Alignment: Symmetrical Functional ROM: Mechanically restricted ROM      Stability: No instability detected Muscle Tone/Strength: Functionally intact. No obvious neuro-muscular anomalies detected. Sensory (Neurological): Unimpaired Palpation: No palpable anomalies             Thoracic Spine Area Exam  Skin & Axial Inspection: No masses, redness, or swelling Alignment: Symmetrical Functional ROM: Unrestricted ROM Stability: No instability detected Muscle Tone/Strength: Functionally intact. No obvious neuro-muscular anomalies detected. Sensory (Neurological): Unimpaired Muscle strength & Tone: No palpable anomalies Lumbar Spine Area Exam  Skin & Axial Inspection: No masses, redness, or swelling Alignment: Symmetrical Functional ROM: Pain restricted ROM       Stability: No instability detected Muscle Tone/Strength: Functionally intact. No obvious  neuro-muscular anomalies detected. Sensory (  Neurological): Dermatomal pain pattern  Lower Extremity Exam    Side: Right lower extremity  Side: Left lower extremity  Stability: No instability observed          Stability: No instability observed          Skin & Extremity Inspection: Skin color, temperature, and hair growth are WNL. No peripheral edema or cyanosis. No masses, redness, swelling, asymmetry, or associated skin lesions. No contractures.  Skin & Extremity Inspection: Skin color, temperature, and hair growth are WNL. No peripheral edema or cyanosis. No masses, redness, swelling, asymmetry, or associated skin lesions. No contractures.  Functional ROM: Pain restricted ROM for all joints of the lower extremity          Functional ROM: Pain restricted ROM for hip and knee joints          Muscle Tone/Strength: Deconditioned  Muscle Tone/Strength: Deconditioned  Sensory (Neurological): Neuropathic pain pattern        Sensory (Neurological): Neuropathic pain pattern        DTR: Patellar: deferred today Achilles: deferred today Plantar: deferred today  DTR: Patellar: deferred today Achilles: deferred today Plantar: deferred today  Palpation: No palpable anomalies  Palpation: No palpable anomalies    Assessment  Primary Diagnosis & Pertinent Problem List: The primary encounter diagnosis was Chronic radicular lumbar pain. Diagnoses of Lumbar radiculopathy, Spinal stenosis, lumbar region, with neurogenic claudication, S/P cervical spinal fusion, Arthritis of left knee, Chronic pain of left knee, Chronic painful diabetic neuropathy ([place]), and Chronic pain syndrome were also pertinent to this visit.  Visit Diagnosis (New problems to examiner): 1. Chronic radicular lumbar pain   2. Lumbar radiculopathy   3. Spinal stenosis, lumbar region, with neurogenic claudication   4. S/P cervical spinal fusion   5. Arthritis of left knee   6. Chronic pain of left knee   7. Chronic painful diabetic  neuropathy ([place])   8. Chronic pain syndrome    Plan of Care (Initial workup plan)  Note: Mr. Thune was reminded that as per protocol, today's visit has been an evaluation only. We have not taken over the patient's controlled substance management.  1. Chronic radicular lumbar pain - MR LUMBAR SPINE WO CONTRAST; Future -Worsening low back and leg pain, previous lumbar MRI shows multilevel lumbar spondylosis, multilevel foraminal narrowing consistent with radicular pain  2. Lumbar radiculopathy - MR LUMBAR SPINE WO CONTRAST; Future  3. Spinal stenosis, lumbar region, with neurogenic claudication - MR LUMBAR SPINE WO CONTRAST; Future  4. S/P cervical spinal fusion -stable  5. Arthritis of left knee - GENICULAR NERVE BLOCK; Future -MRI evidence of severe left knee tricompartmental osteoarthritis.  6. Chronic pain of left knee - GENICULAR NERVE BLOCK; Future  7. Chronic painful diabetic neuropathy (HCC) - NEUROLYSIS; Future  8. Chronic pain syndrome - GENICULAR NERVE BLOCK; Future - NEUROLYSIS; Future - Compliance Drug Analysis, Ur - Ambulatory referral to Psychology: Patient will need to complete psych assessment before I consider taking him on for chronic opioid therapy. - MR LUMBAR SPINE WO CONTRAST; Future   Lab Orders         Compliance Drug Analysis, Ur     Imaging Orders         MR LUMBAR SPINE WO CONTRAST     Referral Orders         Ambulatory referral to Psychology     Procedure Orders         GENICULAR NERVE BLOCK  NEUROLYSIS       Interventional management options: Mr. Pauwels was informed that there is no guarantee that he would be a candidate for interventional therapies. The decision will be based on the results of diagnostic studies, as well as Mr. Iannello risk profile.  Procedure(s) under consideration:  Knee genicular nerve block, RFA Qutenza left       Provider-requested follow-up: Return in about 4 weeks (around 11/11/2021) for Left GNB and  Qutenza , in clinic NS (ok to continue Coumadin). I spent a total of 60 minutes reviewing chart data, face-to-face evaluation with the patient, counseling and coordination of care as detailed above.   Future Appointments  Date Time Provider [place]  10/18/2021  8:45 AM [facility]  10/19/2021 10:10 AM Ronnell Freshwater, NP [facility] None  11/04/2021  9:00 AM Parrett, Fonnie Mu, NP [facility] None  12/09/2021  9:30 AM Ronnell Freshwater, NP [facility] None  02/10/2022 10:00 AM Minus Breeding, MD [facility]    Note by: Gillis Santa, MD Date: 10/14/2021; Time: 2:23 PM

## 2021-10-14 NOTE — Patient Instructions (Signed)
______________________________________________________________________  Preparing for your procedure (without sedation)  Procedure appointments are limited to planned procedures: No Prescription Refills. No disability issues will be discussed. No medication changes will be discussed.  Instructions: Food Intake: Avoid eating anything for at least 4 hours prior to your procedure. Transportation: Unless otherwise stated by your physician, bring a driver. Morning Medicines: Take all of your scheduled morning medications. If you take heart medicine, except for blood thinners, do not forget to take it the morning of the procedure. If your Diastolic (lower reading) is above 100 mmHg, elective cases will be cancelled/rescheduled. Blood thinners: These will need to be stopped for procedures. Notify our staff if you are taking any blood thinners. Depending on which one you take, there will be specific instructions on how and when to stop it. Diabetics on insulin: Notify the staff so that you can be scheduled 1st case in the morning. If your diabetes requires high dose insulin, take only  of your normal insulin dose the morning of the procedure and notify the staff that you have done so. Preventing infections: Shower with an antibacterial soap the morning of your procedure.  Build-up your immune system: Take 1000 mg of Vitamin C with every meal (3 times a day) the day prior to your procedure. Antibiotics: Inform the staff if you have a condition or reason that requires you to take antibiotics before dental procedures. Pregnancy: If you are pregnant, call and cancel the procedure. Sickness: If you have a cold, fever, or any active infections, call and cancel the procedure. Arrival: You must be in the facility at least 30 minutes prior to your scheduled procedure. Children: Do not bring any children with you. Dress appropriately: There is always a possibility that your clothing may get soiled. Valuables:  Do not bring any jewelry or valuables.  Reasons to call and reschedule or cancel your procedure: (Following these recommendations will minimize the risk of a serious complication.) Surgeries: Avoid having procedures within 2 weeks of any surgery. (Avoid for 2 weeks before or after any surgery). Flu Shots: Avoid having procedures within 2 weeks of a flu shots or . (Avoid for 2 weeks before or after immunizations). Barium: Avoid having a procedure within 7-10 days after having had a radiological study involving the use of radiological contrast. (Myelograms, Barium swallow or enema study). Heart attacks: Avoid any elective procedures or surgeries for the initial 6 months after a "Myocardial Infarction" (Heart Attack). Blood thinners: It is imperative that you stop these medications before procedures. Let us know if you if you take any blood thinner.  Infection: Avoid procedures during or within two weeks of an infection (including chest colds or gastrointestinal problems). Symptoms associated with infections include: Localized redness, fever, chills, night sweats or profuse sweating, burning sensation when voiding, cough, congestion, stuffiness, runny nose, sore throat, diarrhea, nausea, vomiting, cold or Flu symptoms, recent or current infections. It is specially important if the infection is over the area that we intend to treat. Heart and lung problems: Symptoms that may suggest an active cardiopulmonary problem include: cough, chest pain, breathing difficulties or shortness of breath, dizziness, ankle swelling, uncontrolled high or unusually low blood pressure, and/or palpitations. If you are experiencing any of these symptoms, cancel your procedure and contact your primary care physician for an evaluation.  Remember:  Regular Business hours are:  Monday to Thursday 8:00 AM to 4:00 PM  Provider's Schedule: Milinda Pointer, MD:  Procedure days: Tuesday and Thursday 7:30 AM to 4:00 PM  Bilal  Lateef, MD:  Procedure days: Monday and Wednesday 7:30 AM to 4:00 PM ______________________________________________________________________   

## 2021-10-15 ENCOUNTER — Other Ambulatory Visit: Payer: Self-pay | Admitting: Cardiology

## 2021-10-18 ENCOUNTER — Ambulatory Visit (INDEPENDENT_AMBULATORY_CARE_PROVIDER_SITE_OTHER): Payer: Commercial Managed Care - HMO

## 2021-10-18 ENCOUNTER — Ambulatory Visit: Payer: Commercial Managed Care - HMO

## 2021-10-18 ENCOUNTER — Telehealth: Payer: Self-pay | Admitting: Student in an Organized Health Care Education/Training Program

## 2021-10-18 DIAGNOSIS — I480 Paroxysmal atrial fibrillation: Secondary | ICD-10-CM

## 2021-10-18 DIAGNOSIS — Z7901 Long term (current) use of anticoagulants: Secondary | ICD-10-CM

## 2021-10-18 LAB — POCT INR: INR: 3.2 — AB (ref 2.0–3.0)

## 2021-10-18 NOTE — Telephone Encounter (Signed)
Patient stated that when he went to get meds on Saturday , No meds were send in for him. Please give patient a call. Thanks

## 2021-10-18 NOTE — Patient Instructions (Signed)
Description   Hold today's dose and then continue taking 1 tablet daily except for 1/2 tablet every Monday, Wednesday and Friday.  Repeat INR  in 6 weeks.

## 2021-10-18 NOTE — Telephone Encounter (Signed)
Patient called and per Dr. Holley Raring note at last visit patient needs psychology consult prior to prescribing. Orders were placed at last visit. Patient aware.

## 2021-10-19 ENCOUNTER — Encounter: Payer: Self-pay | Admitting: Nurse Practitioner

## 2021-10-19 ENCOUNTER — Ambulatory Visit (INDEPENDENT_AMBULATORY_CARE_PROVIDER_SITE_OTHER): Payer: Commercial Managed Care - HMO | Admitting: Nurse Practitioner

## 2021-10-19 VITALS — BP 122/67 | HR 72 | Temp 97.9°F | Ht 74.02 in | Wt 240.4 lb

## 2021-10-19 DIAGNOSIS — R161 Splenomegaly, not elsewhere classified: Secondary | ICD-10-CM | POA: Diagnosis not present

## 2021-10-19 DIAGNOSIS — R160 Hepatomegaly, not elsewhere classified: Secondary | ICD-10-CM

## 2021-10-19 DIAGNOSIS — R109 Unspecified abdominal pain: Secondary | ICD-10-CM | POA: Diagnosis not present

## 2021-10-19 DIAGNOSIS — J189 Pneumonia, unspecified organism: Secondary | ICD-10-CM

## 2021-10-19 LAB — COMPLIANCE DRUG ANALYSIS, UR

## 2021-10-19 MED ORDER — AZITHROMYCIN 250 MG PO TABS: ORAL_TABLET | ORAL | 0 refills | Status: DC

## 2021-10-19 NOTE — Progress Notes (Signed)
Established patient visit   Patient: [name]   DOB: 1955-12-28   66 y.o. Male  MRN: [id number] Visit Date: 10/19/2021   Chief Complaint  Patient presents with   Follow-up   Subjective    HPI  The patient is here for follow up visit.  -had ultrasound of his abdomen. Results are as follows: --1. Hepatomegaly with abnormal appearance of the parenchyma suggesting hepatic steatosis and/or other hepatocellular disease. 2. Marked splenomegaly. 3. Bilateral simple and mildly complex appearing renal cysts, as seen on previous studies.- -the patient does have a GI provider, Dr. Ardis Hughs. Patient and his wife state that Dr. Ardis Hughs was supposed to be doing something about his liver for some time, but has not had anything done about it yet. -recently diagnosed with gout. Was started on colchicine per me. Patient has taken one dose and has not taken this after initial dose.  -patient with significant pain in the lower back and in both legs. Legs feeling week. Has severe muscle cramps and pain. States that muscle relaxer makes him feel very tired and weak. Patient has now seen pain management. Is going to have a new MRI of the lumbar spine. Will have psychological assessment prior to prescribing any narcotic medication.  -patient was seen in ER due to bronchitis. Was started on Augmentin on 10/13/2021 and is still on the antibiotic. He is gradually feeling better.  -new inguinal hernia - left side - happened at work. Making it so he cannot work or lift anything.      Medications: Outpatient Medications Prior to Visit  Medication Sig   albuterol (PROAIR HFA) 108 (90 Base) MCG/ACT inhaler Inhale 1-2 puffs into the lungs every 6 (six) hours as needed for wheezing or shortness of breath.   b complex vitamins tablet Take 1 tablet by mouth daily.    bisoprolol (ZEBETA) 10 MG tablet Take 10 mg by mouth daily.    colchicine 0.6 MG tablet Take 2 tablet po once. Then take 1 tablet po one hour later.  Wait 2 days then begin taking 1 tablet po QD to prevent acute gout. (Patient not taking: Reported on 10/29/2021)   Continuous Blood Gluc Receiver (DEXCOM G6 RECEIVER) DEVI    Continuous Blood Gluc Transmit (DEXCOM G6 TRANSMITTER) MISC USE AS DIRECTED   cyclobenzaprine (FLEXERIL) 10 MG tablet Take 10 mg by mouth 3 (three) times daily. (Patient not taking: Reported on 10/29/2021)   Dulaglutide (TRULICITY) 3 WU/1.3KG SOPN Inject 3 mg as directed once a week.   gabapentin (NEURONTIN) 300 MG capsule Take 300 mg by mouth See admin instructions. Take 1 capsule (300 mg) by mouth in the morning & take 3 capsules (900 mg) by mouth at night   HUMALOG KWIKPEN 100 UNIT/ML KwikPen Inject 0-60 Units into the skin 3 (three) times daily with meals.   Insulin Pen Needle (EASY COMFORT PEN NEEDLES) 31G X 5 MM MISC To use with as needed insulin dosing up to four times daily as needed for hyperglycemia   losartan (COZAAR) 50 MG tablet TAKE 1 TABLET BY MOUTH DAILY   metolazone (ZAROXOLYN) 2.5 MG tablet TAKE 1 TABLET BY MOUTH ON TUESDAY AND THURSDAY 30 MINUTES BEFORE TAKING TORSEMIDE OR AS DIRECTED   nitroGLYCERIN (NITROSTAT) 0.4 MG SL tablet Place 1 tablet (0.4 mg total) under the tongue every 5 (five) minutes x 3 doses as needed for chest pain. (Patient not taking: Reported on 10/29/2021)   omeprazole (PRILOSEC) 20 MG capsule Take 1 capsule (20 mg total) by  mouth 2 (two) times daily.   oxyCODONE-acetaminophen (PERCOCET) 10-325 MG tablet Take 1.5 tablets by mouth in the morning, at noon, in the evening, and at bedtime. (Patient not taking: Reported on 10/29/2021)   potassium chloride SA (KLOR-CON M) 20 MEQ tablet TAKE 1 TABLET BY MOUTH DAILY. TAKE AN EXTRA TABLET ON TUESDAY AND THURSDAY   pravastatin (PRAVACHOL) 80 MG tablet TAKE 1 TABLET BY MOUTH DAILY AT BEDTIME   tamsulosin (FLOMAX) 0.4 MG CAPS capsule TAKE 1 CAPSULE BY MOUTH EACH NIGHT AT BEDTIME   torsemide (DEMADEX) 20 MG tablet Take 2 tablets in the morning and 1 tablet  in the PM   triamcinolone cream (KENALOG) 0.1 % Apply 1 application topically daily as needed.   umeclidinium-vilanterol (ANORO ELLIPTA) 62.5-25 MCG/INH AEPB Inhale 1 puff into the lungs daily. (Patient taking differently: Inhale 1 puff into the lungs daily as needed (respiratory issues.).)   VITAMIN A PO Take 2,400 mcg by mouth daily.    Vitamin D, Ergocalciferol, (DRISDOL) 1.25 MG (50000 UT) CAPS capsule Take 50,000 Units by mouth every Friday.   warfarin (COUMADIN) 5 MG tablet TAKE 1 TO 1 AND 1/2 TABLETS BY MOUTH DAILY AS DIRECTED BY THE COUMADIN CLINIC   [DISCONTINUED] amoxicillin-clavulanate (AUGMENTIN) 875-125 MG tablet Take 1 tablet by mouth every 12 (twelve) hours.   [DISCONTINUED] Continuous Blood Gluc Sensor (DEXCOM G6 SENSOR) MISC 1 Device by Does not apply route See admin instructions. Change every 10 days   [DISCONTINUED] diltiazem (CARDIZEM CD) 120 MG 24 hr capsule Take 120 mg by mouth daily.   [DISCONTINUED] DULoxetine (CYMBALTA) 20 MG capsule Take 1 capsule (20 mg total) by mouth daily. (Patient not taking: Reported on 10/29/2021)   [DISCONTINUED] Multiple Vitamin (MULTIVITAMIN WITH MINERALS) TABS Take 1 tablet by mouth daily. (Patient not taking: Reported on 10/29/2021)   [DISCONTINUED] Multiple Vitamins-Minerals (ZINC PO) Take 25 mg by mouth daily. (Patient not taking: Reported on 10/29/2021)   [DISCONTINUED] predniSONE (DELTASONE) 10 MG tablet 1 twice daily until gone (Patient not taking: Reported on 10/29/2021)   No facility-administered medications prior to visit.    Review of Systems  Constitutional:  Positive for activity change and fatigue. Negative for chills and fever.  HENT:  Positive for congestion and postnasal drip. Negative for rhinorrhea, sinus pressure, sinus pain, sneezing and sore throat.   Eyes: Negative.   Respiratory:  Positive for cough, shortness of breath and wheezing.   Cardiovascular:  Negative for chest pain and palpitations.  Gastrointestinal:  Negative  for constipation, diarrhea, nausea and vomiting.  Endocrine: Negative for cold intolerance, heat intolerance, polydipsia and polyuria.  Genitourinary:  Negative for dysuria, frequency and urgency.  Musculoskeletal:  Positive for arthralgias, joint swelling and myalgias. Negative for back pain.  Skin:  Negative for rash.  Allergic/Immunologic: Positive for environmental allergies.  Neurological:  Positive for headaches. Negative for dizziness and weakness.  Psychiatric/Behavioral:  Positive for dysphoric mood. The patient is nervous/anxious.     Last CBC Lab Results  Component Value Date   WBC 5.4 08/12/2021   HGB 11.0 (L) 08/12/2021   HCT 32.9 (L) 08/12/2021   MCV 92 08/12/2021   MCH 30.6 08/12/2021   RDW 16.8 (H) 08/12/2021   PLT 125 (L) 55/73/2202   Last metabolic panel Lab Results  Component Value Date   GLUCOSE 295 (H) 08/12/2021   NA 142 08/12/2021   K 3.5 08/12/2021   CL 98 08/12/2021   CO2 29 08/12/2021   BUN 18 08/12/2021   CREATININE 1.26 08/12/2021  EGFR 64 08/12/2021   CALCIUM 8.9 08/12/2021   PROT 7.1 09/21/2021   ALBUMIN 4.2 09/21/2021   LABGLOB 2.8 08/12/2021   AGRATIO 1.4 08/12/2021   BILITOT 0.4 09/21/2021   ALKPHOS 141 (H) 09/21/2021   AST 27 09/21/2021   ALT 16 09/21/2021   ANIONGAP 12 04/23/2021   Last lipids Lab Results  Component Value Date   CHOL 122 08/18/2017   HDL 29 (L) 08/18/2017   LDLCALC 13 08/18/2017   LDLDIRECT 99.9 09/08/2011   TRIG 400 (H) 08/18/2017   CHOLHDL 4.2 08/18/2017   Last hemoglobin A1c Lab Results  Component Value Date   HGBA1C 7.6 (A) 07/29/2021   Last thyroid functions Lab Results  Component Value Date   TSH 3.160 08/12/2021       Objective     Today's Vitals   10/19/21 1014  BP: 122/67  Pulse: 72  Temp: 97.9 F (36.6 C)  SpO2: 94%  Weight: 240 lb 6.4 oz (109 kg)  Height: 6' 2.02" (1.88 m)   Body mass index is 30.85 kg/m.   BP Readings from Last 3 Encounters:  10/29/21 (!) 100/50   10/19/21 122/67  10/14/21 127/66    Wt Readings from Last 3 Encounters:  10/29/21 243 lb 6.4 oz (110.4 kg)  10/19/21 240 lb 6.4 oz (109 kg)  10/14/21 232 lb (105.2 kg)    Physical Exam Vitals and nursing note reviewed.  Constitutional:      Appearance: Normal appearance. He is well-developed. He is ill-appearing.  HENT:     Head: Normocephalic and atraumatic.     Nose: Nose normal.     Mouth/Throat:     Mouth: Mucous membranes are moist.     Pharynx: Oropharynx is clear.  Eyes:     Extraocular Movements: Extraocular movements intact.     Conjunctiva/sclera: Conjunctivae normal.     Pupils: Pupils are equal, round, and reactive to light.  Cardiovascular:     Rate and Rhythm: Normal rate and regular rhythm.     Pulses: Normal pulses.     Heart sounds: Normal heart sounds.  Pulmonary:     Effort: Pulmonary effort is normal.     Breath sounds: Wheezing and rhonchi present.     Comments: Congested, nonproductive cough present. Abdominal:     General: Bowel sounds are normal. There is distension.     Palpations: Abdomen is soft. There is no mass.     Tenderness: There is abdominal tenderness. There is no right CVA tenderness, left CVA tenderness, guarding or rebound.     Hernia: No hernia is present.  Musculoskeletal:        General: Normal range of motion.     Cervical back: Normal range of motion and neck supple.  Lymphadenopathy:     Cervical: Cervical adenopathy present.  Skin:    General: Skin is warm and dry.     Capillary Refill: Capillary refill takes less than 2 seconds.  Neurological:     General: No focal deficit present.     Mental Status: He is alert and oriented to person, place, and time.  Psychiatric:        Mood and Affect: Mood normal.        Behavior: Behavior normal.        Thought Content: Thought content normal.        Judgment: Judgment normal.       Assessment & Plan    1. Community acquired pneumonia, unspecified laterality Patiently  recently seen  at urgent care, diagnosed with pneumonia.  Has finished Augmentin prescribed per urgent care.  Still having shortness of breath, rhonchi, and wheezes.  Will start Z-Pak.  Take as directed for 5 days.  Use inhalers as prescribed.  Consider follow-up with pulmonology.  2. Abdominal discomfort Reviewed ultrasound of the abdomen done since his last visit.  Ultrasound did show1. Hepatomegaly with abnormal appearance of the parenchyma suggesting hepatic steatosis and/or other hepatocellular disease. 2. Marked splenomegaly. 3. Bilateral simple and mildly complex appearing renal cysts.  Will refer to GI provider for further evaluation and treatment.  3. Hepatomegaly  Refer to GI provider for further evaluation and treatment. - Ambulatory referral to Gastroenterology  4. Splenomegaly Refer to GI provider for further evaluation and treatment. - Ambulatory referral to Gastroenterology   Problem List Items Addressed This Visit       Digestive   Hepatomegaly   Relevant Orders   Ambulatory referral to Gastroenterology     Other   Abdominal discomfort   Splenomegaly   Relevant Orders   Ambulatory referral to Gastroenterology   Other Visit Diagnoses     Community acquired pneumonia, unspecified laterality    -  Primary        Return in about 6 weeks (around 11/30/2021) for diabetes with HgbA1c check.         Ronnell Freshwater, NP  [facility] [phone] (phone) [phone] (fax)  [place]

## 2021-10-20 ENCOUNTER — Encounter: Payer: Self-pay | Admitting: Endocrinology

## 2021-10-26 ENCOUNTER — Telehealth: Payer: Self-pay | Admitting: Pulmonary Disease

## 2021-10-26 ENCOUNTER — Telehealth: Payer: Self-pay

## 2021-10-26 DIAGNOSIS — M5416 Radiculopathy, lumbar region: Secondary | ICD-10-CM

## 2021-10-26 DIAGNOSIS — G8929 Other chronic pain: Secondary | ICD-10-CM

## 2021-10-26 NOTE — Telephone Encounter (Signed)
Called and spoke with pt to get him scheduled for a visit and pt said that he would not be able to come in until Friday due to a family member just coming home from the hospital and would need to stay with them for a couple days. I  have scheduled pt for a visit Friday with [facility] but stated to pt if he became worse between now and then to go to UC to be evaluated and pt stated that he felt like he needed to go there now to be evaluated. Stated to pt if he felt like he needed to be seen at [facility] now to go get checked out and we would keep appt as scheduled and he verbalized understanding. Nothing further needed.

## 2021-10-26 NOTE — Telephone Encounter (Signed)
When calling the patient to ask where he wants to do PT he became very upset because he feels like we are not doing anything for him. States he feels like a pinball and no doctors are helping him. He kept asking what PT is going to do for him. He said you have an MRI from a couple of years ago and you should be able to treat him from that. He was argumentative, yet apologetic to me, but we never came to any decisions. I told him I would let you  know that he really didn't want to do the PT and what are other options. Please advise.

## 2021-10-26 NOTE — Telephone Encounter (Signed)
His insurance denied the MRI because they want to see 6 weeks of treatment and follow up before they will approve an MRI. How do you want to proceed?

## 2021-10-27 ENCOUNTER — Other Ambulatory Visit: Payer: Self-pay | Admitting: Nurse Practitioner

## 2021-10-27 ENCOUNTER — Telehealth: Payer: Self-pay | Admitting: Nurse Practitioner

## 2021-10-27 DIAGNOSIS — J189 Pneumonia, unspecified organism: Secondary | ICD-10-CM

## 2021-10-27 MED ORDER — CEFUROXIME AXETIL 500 MG PO TABS: 500.0000 mg | ORAL_TABLET | Freq: Two times a day (BID) | ORAL | 0 refills | Status: DC

## 2021-10-27 NOTE — Telephone Encounter (Signed)
Sent prescription for ceftin. This is twice daily antibiotic for next 7 days. If this does not clear it, he should see his pulmonologist.

## 2021-10-27 NOTE — Telephone Encounter (Signed)
Called pt he is advised of his Rx that was sent  

## 2021-10-27 NOTE — Telephone Encounter (Signed)
Patient said he doesn't feel like he is completely over the pneumonia and his wife just had knee replacement surgery he cannot get here to be seen and is asking if you can send him in something to the pharmacy?

## 2021-10-28 ENCOUNTER — Telehealth: Payer: Self-pay | Admitting: Cardiology

## 2021-10-28 ENCOUNTER — Other Ambulatory Visit: Payer: Self-pay | Admitting: Nurse Practitioner

## 2021-10-28 MED ORDER — DILTIAZEM HCL ER COATED BEADS 120 MG PO CP24: 120.0000 mg | ORAL_CAPSULE | Freq: Every day | ORAL | 1 refills | Status: DC

## 2021-10-28 MED ORDER — DILTIAZEM HCL ER COATED BEADS 120 MG PO CP24: 120.0000 mg | ORAL_CAPSULE | Freq: Every day | ORAL | 3 refills | Status: DC

## 2021-10-28 NOTE — Telephone Encounter (Signed)
Insulin dependant diabetes

## 2021-10-28 NOTE — Telephone Encounter (Signed)
*  STAT* If patient is at the pharmacy, call can be transferred to refill team.   1. Which medications need to be refilled? (please list name of each medication and dose if known)   diltiazem (CARDIZEM CD) 120 MG 24 hr capsule    2. Which pharmacy/location (including street and city if local pharmacy) is medication to be sent to? [place], [place]  3. Do they need a 30 day or 90 day supply?  30 day   Pt would like a nurse to give him a call regarding his oxygen level. Please advise

## 2021-10-28 NOTE — Telephone Encounter (Signed)
Spoke with pt and is complaining of O2 running in the mid 17 's to low 90's Per pt does not have O2 therapy Pt has appt tomorrow with pulmonary but was thinking about cancelling due to wife having had a  knee replacement on Monday and needing to take care of her. Per pt had arrangements made for a caretaker to stay with wife but has  cx at the last minute Encouraged pt  to keep appt and find someone else to stay with wife for a few hours Pt was given a order for antibiotic yesterday to treat pneumonia Pt is coughing and does have inhalers to use as directed Has not started this yet Informed pt this does not sound heart related Will forward to Dr Percival Spanish for review ./cy   Diltiazen filled as requested .Adonis Housekeeper

## 2021-10-29 ENCOUNTER — Ambulatory Visit: Payer: Commercial Managed Care - HMO | Admitting: Nurse Practitioner

## 2021-10-29 ENCOUNTER — Encounter: Payer: Self-pay | Admitting: Nurse Practitioner

## 2021-10-29 ENCOUNTER — Ambulatory Visit (INDEPENDENT_AMBULATORY_CARE_PROVIDER_SITE_OTHER): Payer: Commercial Managed Care - HMO

## 2021-10-29 VITALS — BP 100/50 | HR 95 | Temp 97.9°F | Ht 74.0 in | Wt 243.4 lb

## 2021-10-29 DIAGNOSIS — J441 Chronic obstructive pulmonary disease with (acute) exacerbation: Secondary | ICD-10-CM | POA: Diagnosis not present

## 2021-10-29 DIAGNOSIS — I5032 Chronic diastolic (congestive) heart failure: Secondary | ICD-10-CM | POA: Diagnosis not present

## 2021-10-29 MED ORDER — TRELEGY ELLIPTA 100-62.5-25 MCG/ACT IN AEPB: 1.0000 | INHALATION_SPRAY | Freq: Every day | RESPIRATORY_TRACT | 0 refills | Status: DC

## 2021-10-29 MED ORDER — PREDNISONE 10 MG PO TABS: ORAL_TABLET | ORAL | 0 refills | Status: DC

## 2021-10-29 MED ORDER — ALBUTEROL SULFATE (2.5 MG/3ML) 0.083% IN NEBU: 2.5000 mg | INHALATION_SOLUTION | Freq: Four times a day (QID) | RESPIRATORY_TRACT | 12 refills | Status: DC | PRN

## 2021-10-29 NOTE — Progress Notes (Signed)
$'@Patient'd$  ID: [name], male    DOB: 1955/10/26, 66 y.o.   MRN: [id number]  Chief Complaint  Patient presents with   Acute Visit    Sob, coughing up thick brown/green mucous.  Sometimes he cannot get anything up.      Referring provider: Ronnell Freshwater, NP  HPI: 66 year old male, current smoker followed for COPD and OSA intolerant of CPAP.  Past medical history significant for A-fib status post ablation in 2016 and on chronic anticoagulation with Coumadin, hypertension, pulmonary hypertension, deviated septum, GERD, cirrhosis, DM 2, neuropathy, HLD, depression, CHF.  TEST/EVENTS:   08/05/2021: OV with Dr. Elsworth Soho.  Again educated on smoking cessation; reported that he was not ready to quit.  Previously been evaluated by orthopedics for lumbar radiculopathy.  He has severe pain from this and muscular weakness in all 4 extremities.  He also has worsening tremors with no evidence of cogwheel rigidity.  He does have a history of neck surgery in the past due to cervical disc disease.  He was referred to neurology at this visit.  Does have significant OSA but has been intolerant of CPAP therapy.  May be candidate for inspire device but advised that he needed to be evaluated for his pain and neurological situation first.  Only using Anoro sporadically for COPD; reeducated on purpose of maintenance therapy and encouraged to use daily.  10/29/2021: Today-acute Patient presents today after calling into the office earlier this week with complaints of shortness of breath.  He is caring for his wife who just came home from the hospital after having knee surgery so he was unable to come in until today.  He has also noticed some drops in his oxygen into the 80s.  Does not use supplemental O2 at baseline.  Upon further review, it looks like he was seen in the ED for increased shortness of breath and productive cough on 5/31 and treated for acute bronchitis with Augmentin course and 10 mg of prednisone daily  for 7 days.  No imaging or labs were obtained.  He was then seen at his PCPs office on 6/6; started on Z-Pak due to ongoing wheezing/rhonchi and cough. He then contacted their office on 6/14 reporting that he didn't feel like he was quite over the "pneumonia" and he was prescribed Ceftin, which he started yesterday.   Today, he reports still feeling short of breath and having drops in his oxygen at home, which are mainly in the morning. Lowest he has seen has been 87%. He feels like his cough has become less productive but he has terrible chest congestion, especially in the mornings, which makes it harder to breathe. He denies any fevers, hemoptysis, night sweats, lower extremity edema. He is on Anoro but does not take it every day as he sometimes forgets. Has not been using his neb or albuterol inhaler for rescue. He is eating and drinking well. He is helping to care for his wife who just had knee surgery so he has had trouble remembering to take his prescribed medications.   Allergies  Allergen Reactions   Adhesive [Tape]     itching   Latex Itching    When tape is on the skin too long skin gets red & itching    Immunization History  Administered Date(s) Administered   Influenza Split 01/30/2011, 03/05/2012, 02/13/2013, 02/03/2016, 02/20/2019   Influenza Whole 02/13/2009   Influenza,inj,Quad PF,6+ Mos 03/14/2014, 04/22/2015, 01/19/2016, 02/02/2017, 02/05/2019, 05/01/2020, 04/19/2021   Influenza-Unspecified 01/30/2011, 03/05/2012, 02/13/2013, 03/14/2014,  04/22/2015, 01/19/2016, 02/03/2016, 02/02/2017, 02/13/2018   Pneumococcal Polysaccharide-23 02/14/2007    Past Medical History:  Diagnosis Date   Arthritis    lower back   Asthma    Atrial fibrillation [facility])    Atrial flutter ([place])    s/p CTI ablation by Dr Rayann Heman   Brain aneurysm 2009   questionable. A follow up CTA in 2009 showed no evidence of   Chronic back pain    DDD/stenosis   Colon polyps    9 polyps removed 9/89/21    Complication of anesthesia 09/11/2012   slow to awaken after ablation   COPD (chronic obstructive pulmonary disease) ([place])    Diabetes mellitus    takes Metformin and Glimepiride daily   Emphysema    GERD (gastroesophageal reflux disease)    takes Omeprazole bid   Heart failure ([place])    History of shingles    HLD (hyperlipidemia)    takes Pravastatin daily   HTN (hypertension)    takes Prinizide daily   Obesity    OSA (obstructive sleep apnea)    not always using cpap   Overdose 2009   unintentional Flecanide overdose   Peripheral neuropathy    Short-term memory loss    Tobacco abuse     Tobacco History: Social History   Tobacco Use  Smoking Status Every Day   Packs/day: 1.00   Years: 49.00   Total pack years: 49.00   Types: Cigarettes  Smokeless Tobacco Never  Tobacco Comments   Smoking a half a pack per day as of 10/29/21 hfb   Ready to quit: Not Answered Counseling given: Not Answered Tobacco comments: Smoking a half a pack per day as of 10/29/21 hfb   Outpatient Medications Prior to Visit  Medication Sig Dispense Refill   albuterol (PROAIR HFA) 108 (90 Base) MCG/ACT inhaler Inhale 1-2 puffs into the lungs every 6 (six) hours as needed for wheezing or shortness of breath. 18 g 5   b complex vitamins tablet Take 1 tablet by mouth daily.      bisoprolol (ZEBETA) 10 MG tablet Take 10 mg by mouth daily.      cefUROXime (CEFTIN) 500 MG tablet Take 1 tablet (500 mg total) by mouth 2 (two) times daily with a meal. 14 tablet 0   Continuous Blood Gluc Receiver (DEXCOM G6 RECEIVER) DEVI      Continuous Blood Gluc Sensor (DEXCOM G6 SENSOR) MISC USE AS DIRECTED 9 each 3   Continuous Blood Gluc Transmit (DEXCOM G6 TRANSMITTER) MISC USE AS DIRECTED 1 each 3   diltiazem (CARDIZEM CD) 120 MG 24 hr capsule Take 1 capsule (120 mg total) by mouth daily. 90 capsule 1   Dulaglutide (TRULICITY) 3 JH/4.1DE SOPN Inject 3 mg as directed once a week. 6 mL 3   gabapentin (NEURONTIN) 300 MG  capsule Take 300 mg by mouth See admin instructions. Take 1 capsule (300 mg) by mouth in the morning & take 3 capsules (900 mg) by mouth at night     HUMALOG KWIKPEN 100 UNIT/ML KwikPen Inject 0-60 Units into the skin 3 (three) times daily with meals. 135 mL 3   Insulin Pen Needle (EASY COMFORT PEN NEEDLES) 31G X 5 MM MISC To use with as needed insulin dosing up to four times daily as needed for hyperglycemia 300 each 3   losartan (COZAAR) 50 MG tablet TAKE 1 TABLET BY MOUTH DAILY 90 tablet 3   metolazone (ZAROXOLYN) 2.5 MG tablet TAKE 1 TABLET BY MOUTH ON TUESDAY AND  THURSDAY 30 MINUTES BEFORE TAKING TORSEMIDE OR AS DIRECTED 15 tablet 3   omeprazole (PRILOSEC) 20 MG capsule Take 1 capsule (20 mg total) by mouth 2 (two) times daily. 60 capsule 11   potassium chloride SA (KLOR-CON M) 20 MEQ tablet TAKE 1 TABLET BY MOUTH DAILY. TAKE AN EXTRA TABLET ON TUESDAY AND THURSDAY 40 tablet 6   pravastatin (PRAVACHOL) 80 MG tablet TAKE 1 TABLET BY MOUTH DAILY AT BEDTIME 30 tablet 10   tamsulosin (FLOMAX) 0.4 MG CAPS capsule TAKE 1 CAPSULE BY MOUTH EACH NIGHT AT BEDTIME 30 capsule 11   torsemide (DEMADEX) 20 MG tablet Take 2 tablets in the morning and 1 tablet in the PM 270 tablet 3   triamcinolone cream (KENALOG) 0.1 % Apply 1 application topically daily as needed. 453 g 2   umeclidinium-vilanterol (ANORO ELLIPTA) 62.5-25 MCG/INH AEPB Inhale 1 puff into the lungs daily. (Patient taking differently: Inhale 1 puff into the lungs daily as needed (respiratory issues.).) 60 each 5   VITAMIN A PO Take 2,400 mcg by mouth daily.      Vitamin D, Ergocalciferol, (DRISDOL) 1.25 MG (50000 UT) CAPS capsule Take 50,000 Units by mouth every Friday.     warfarin (COUMADIN) 5 MG tablet TAKE 1 TO 1 AND 1/2 TABLETS BY MOUTH DAILY AS DIRECTED BY THE COUMADIN CLINIC 40 tablet 1   colchicine 0.6 MG tablet Take 2 tablet po once. Then take 1 tablet po one hour later. Wait 2 days then begin taking 1 tablet po QD to prevent acute gout.  (Patient not taking: Reported on 10/29/2021) 33 tablet 2   cyclobenzaprine (FLEXERIL) 10 MG tablet Take 10 mg by mouth 3 (three) times daily. (Patient not taking: Reported on 10/29/2021)     nitroGLYCERIN (NITROSTAT) 0.4 MG SL tablet Place 1 tablet (0.4 mg total) under the tongue every 5 (five) minutes x 3 doses as needed for chest pain. (Patient not taking: Reported on 10/29/2021) 25 tablet 3   oxyCODONE-acetaminophen (PERCOCET) 10-325 MG tablet Take 1.5 tablets by mouth in the morning, at noon, in the evening, and at bedtime. (Patient not taking: Reported on 10/29/2021)     DULoxetine (CYMBALTA) 20 MG capsule Take 1 capsule (20 mg total) by mouth daily. (Patient not taking: Reported on 10/29/2021) 30 capsule 1   Multiple Vitamin (MULTIVITAMIN WITH MINERALS) TABS Take 1 tablet by mouth daily. (Patient not taking: Reported on 10/29/2021)     Multiple Vitamins-Minerals (ZINC PO) Take 25 mg by mouth daily. (Patient not taking: Reported on 10/29/2021)     predniSONE (DELTASONE) 10 MG tablet 1 twice daily until gone (Patient not taking: Reported on 10/29/2021) 6 tablet 0   No facility-administered medications prior to visit.     Review of Systems:   Constitutional: No weight loss or gain, night sweats, fevers, chills. +fatigue HEENT: No headaches, difficulty swallowing, tooth/dental problems, or sore throat. No sneezing, itching, ear ache, nasal congestion, or post nasal drip CV:  No chest pain, orthopnea, PND, swelling in lower extremities, anasarca, dizziness, palpitations, syncope Resp: +shortness of breath with exertion, chest congestion, productive cough (some improved), occasional wheeze. No hemoptysis.No chest wall deformity GI:  No heartburn, indigestion, abdominal pain, nausea, vomiting, diarrhea, change in bowel habits, loss of appetite, bloody stools.  GU: No dysuria, change in color of urine, urgency or frequency.  No flank pain, no hematuria  Skin: No rash, lesions, ulcerations MSK:  +chronic  joint and back pain; limited upper extremity movement. Ambulates with a walker. No joint swelling  Neuro: No dizziness or lightheadedness. +tremor (baseline) Psych: No depression or anxiety. Mood stable.     Physical Exam:  BP (!) 100/50 (BP Location: Right Arm, Patient Position: Sitting, Cuff Size: Normal)   Pulse 95   Temp 97.9 F (36.6 C) (Oral)   Ht '6\' 2"'$  (1.88 m)   Wt 243 lb 6.4 oz (110.4 kg)   SpO2 95%   BMI 31.25 kg/m   GEN: Pleasant, interactive, chronically-ill appearing; obese; in no acute distress. HEENT:  Normocephalic and atraumatic. PERRLA. Sclera white. Nasal turbinates pink, moist and patent bilaterally. No rhinorrhea present. Oropharynx pink and moist, without exudate or edema. No lesions, ulcerations, or postnasal drip.  NECK:  Supple w/ fair ROM. No JVD present. Normal carotid impulses w/o bruits. Thyroid symmetrical with no goiter or nodules palpated. No lymphadenopathy.   CV: RRR, no m/r/g, no peripheral edema. Pulses intact, +2 bilaterally. No cyanosis, pallor or clubbing. PULMONARY:  Unlabored, regular breathing. Minimal scattered expiratory wheeze bilaterally A&P. No accessory muscle use. No dullness to percussion. GI: BS present and normoactive. Soft, non-tender to palpation. No organomegaly or masses detected. No CVA tenderness. MSK: No erythema, warmth or tenderness. Cap refil <2 sec all extrem. No deformities or joint swelling noted.  Neuro: A/Ox3. No focal deficits noted.   Skin: Warm, no lesions or rashe Psych: Normal affect and behavior. Judgement and thought content appropriate.     Lab Results:  CBC    Component Value Date/Time   WBC 5.4 08/12/2021 1058   WBC 5.5 04/23/2021 0800   RBC 3.59 (L) 08/12/2021 1058   RBC 3.55 (L) 04/23/2021 0800   HGB 11.0 (L) 08/12/2021 1058   HCT 32.9 (L) 08/12/2021 1058   PLT 125 (L) 08/12/2021 1058   MCV 92 08/12/2021 1058   MCH 30.6 08/12/2021 1058   MCH 31.0 04/23/2021 0800   MCHC 33.4 08/12/2021 1058    MCHC 32.0 04/23/2021 0800   RDW 16.8 (H) 08/12/2021 1058   LYMPHSABS 1,282 10/22/2020 1154   LYMPHSABS 1.2 09/24/2019 1449   MONOABS 0.5 12/16/2019 1523   EOSABS 112 10/22/2020 1154   EOSABS 0.1 09/24/2019 1449   BASOSABS 22 10/22/2020 1154   BASOSABS 0.0 09/24/2019 1449    BMET    Component Value Date/Time   NA 142 08/12/2021 1058   K 3.5 08/12/2021 1058   CL 98 08/12/2021 1058   CO2 29 08/12/2021 1058   GLUCOSE 295 (H) 08/12/2021 1058   GLUCOSE 131 (H) 04/23/2021 0800   BUN 18 08/12/2021 1058   CREATININE 1.26 08/12/2021 1058   CREATININE 0.69 (L) 09/14/2016 0858   CALCIUM 8.9 08/12/2021 1058   GFRNONAA >60 04/23/2021 0800   GFRAA 69 05/07/2020 1010    BNP    Component Value Date/Time   BNP 90.8 01/06/2021 1020   BNP 46.1 03/06/2016 2257   BNP 143.6 (H) 04/28/2015 1055     Imaging:  DG Chest 2 View  Result Date: 10/29/2021 CLINICAL DATA:  66 year old male with productive cough and shortness of breath. EXAM: CHEST - 2 VIEW COMPARISON:  Chest radiographs 04/19/2021 and earlier. FINDINGS: PA and lateral views. Lung volumes and mediastinal contours are stable and within normal limits. Lung markings appear stable. No pneumothorax, pulmonary edema, pleural effusion or confluent pulmonary opacity. Prior cervical ACDF. No acute osseous abnormality identified. Negative visible bowel gas. IMPRESSION: No acute cardiopulmonary abnormality. Electronically Signed   By: Genevie Ann M.D.   On: 10/29/2021 11:13    bupivacaine (MARCAINE) 0.25 % (with pres)  injection 3 mL     Date Action Dose Route User   Discharged on 10/13/2021   Admitted on 10/13/2021   09/29/2021 0957 Given 3 mL Intra-articular Lanae Crumbly, PA-C      lidocaine (XYLOCAINE) 1 % (with pres) injection 1 mL     Date Action Dose Route User   Discharged on 10/13/2021   Admitted on 10/13/2021   09/29/2021 0957 Given 1 mL Other Lanae Crumbly, PA-C          Latest Ref Rng & Units 09/16/2015    9:46 AM  PFT Results   FVC-Pre L 3.57  P  FVC-Predicted Pre % 64  P  FVC-Post L 4.09  P  FVC-Predicted Post % 73  P  Pre FEV1/FVC % % 67  P  Post FEV1/FCV % % 68  P  FEV1-Pre L 2.39  P  FEV1-Predicted Pre % 56  P  FEV1-Post L 2.79  P    P Preliminary result    No results found for: "NITRICOXIDE"      Assessment & Plan:   COPD with acute exacerbation (HCC) Unresolved, persistent AECOPD.  CXR completed today did not show any evidence of superimposed infection.  Recommended that he complete Ceftin course as previously prescribed by his PCP.  We will start him on a prednisone taper.  Advised him to closely monitor his blood sugars and adjust insulin accordingly.  Step up to Trelegy.  We again discussed the importance of compliance with maintenance therapy.  I recommended that he keep his AVS in a place where he can look at it in the morning and in the evening to ensure he has done his medications for the day.  Target mucociliary clearance therapies with Mucinex and flutter valve.  Advised that he use albuterol nebs twice daily until symptoms improve.  Walking oximetry today without desaturations on room air.  Close follow-up.  Patient Instructions  Stop Anoro. Start Trelegy 1 puff daily. Brush tongue and rinse mouth afterwards Continue Albuterol inhaler 2 puffs or 3 mL neb every 6 hours as needed for shortness of breath or wheezing. Notify if symptoms persist despite rescue inhaler/neb use. Use nebulizer Twice daily until symptoms get better   -Complete Ceftin as previously prescribed  -Mucinex [phone] mg Twice daily for chest congestion  -Prednisone taper. 4 tabs for 3 days, then 3 tabs for 3 days, 2 tabs for 3 days, then 1 tab for 3 days, then stop. Take in AM with food. If you have blood sugar readings persistently above 350-500 despite insulin therapy, please seek emergency care -Flutter valve 2-3 times a day. Use after your nebulizer.   Monitor your oxygen levels at home for goal >88-90%. If you begin  having drops 88% or below, worsening shortness of breath, or fevers over the weekend, please seek emergency care.   Follow up in 2 weeks with Dr. Elsworth Soho or Alanson Aly. If symptoms do not improve or worsen, please contact office for sooner follow up or seek emergency care.    Chronic diastolic heart failure ([place]) Appears compensated and euvolemic upon exam today.  Follow-up with cardiology as scheduled.   I spent 35 minutes of dedicated to the care of this patient on the date of this encounter to include pre-visit review of records, face-to-face time with the patient discussing conditions above, post visit ordering of testing, clinical documentation with the electronic health record, making appropriate referrals as documented, and communicating necessary findings to members of the patients care  team.  Clayton Bibles, NP 10/29/2021  Pt aware and understands NP's role.

## 2021-10-29 NOTE — Patient Instructions (Addendum)
Stop Anoro. Start Trelegy 1 puff daily. Brush tongue and rinse mouth afterwards Continue Albuterol inhaler 2 puffs or 3 mL neb every 6 hours as needed for shortness of breath or wheezing. Notify if symptoms persist despite rescue inhaler/neb use. Use nebulizer Twice daily until symptoms get better   -Complete Ceftin as previously prescribed  -Mucinex [phone] mg Twice daily for chest congestion  -Prednisone taper. 4 tabs for 3 days, then 3 tabs for 3 days, 2 tabs for 3 days, then 1 tab for 3 days, then stop. Take in AM with food. If you have blood sugar readings persistently above 350-500 despite insulin therapy, please seek emergency care -Flutter valve 2-3 times a day. Use after your nebulizer.   Monitor your oxygen levels at home for goal >88-90%. If you begin having drops 88% or below, worsening shortness of breath, or fevers over the weekend, please seek emergency care.   Follow up in 2 weeks with Dr. Elsworth Soho or Alanson Aly. If symptoms do not improve or worsen, please contact office for sooner follow up or seek emergency care.

## 2021-10-29 NOTE — Assessment & Plan Note (Addendum)
Unresolved, persistent AECOPD.  CXR completed today did not show any evidence of superimposed infection.  Recommended that he complete Ceftin course as previously prescribed by his PCP.  We will start him on a prednisone taper.  Advised him to closely monitor his blood sugars and adjust insulin accordingly.  Step up to Trelegy.  We again discussed the importance of compliance with maintenance therapy.  I recommended that he keep his AVS in a place where he can look at it in the morning and in the evening to ensure he has done his medications for the day.  Target mucociliary clearance therapies with Mucinex and flutter valve.  Advised that he use albuterol nebs twice daily until symptoms improve.  Walking oximetry today without desaturations on room air.  Close follow-up.  Patient Instructions  Stop Anoro. Start Trelegy 1 puff daily. Brush tongue and rinse mouth afterwards Continue Albuterol inhaler 2 puffs or 3 mL neb every 6 hours as needed for shortness of breath or wheezing. Notify if symptoms persist despite rescue inhaler/neb use. Use nebulizer Twice daily until symptoms get better   -Complete Ceftin as previously prescribed  -Mucinex [phone] mg Twice daily for chest congestion  -Prednisone taper. 4 tabs for 3 days, then 3 tabs for 3 days, 2 tabs for 3 days, then 1 tab for 3 days, then stop. Take in AM with food. If you have blood sugar readings persistently above 350-500 despite insulin therapy, please seek emergency care -Flutter valve 2-3 times a day. Use after your nebulizer.   Monitor your oxygen levels at home for goal >88-90%. If you begin having drops 88% or below, worsening shortness of breath, or fevers over the weekend, please seek emergency care.   Follow up in 2 weeks with Dr. Elsworth Soho or Alanson Aly. If symptoms do not improve or worsen, please contact office for sooner follow up or seek emergency care.

## 2021-10-29 NOTE — Assessment & Plan Note (Signed)
Appears compensated and euvolemic upon exam today.  Follow-up with cardiology as scheduled.

## 2021-11-02 ENCOUNTER — Telehealth: Payer: Self-pay | Admitting: Cardiology

## 2021-11-02 NOTE — Telephone Encounter (Signed)
error 

## 2021-11-04 ENCOUNTER — Ambulatory Visit: Payer: Managed Care, Other (non HMO) | Admitting: Adult Health

## 2021-11-04 ENCOUNTER — Telehealth: Payer: Self-pay | Admitting: Student in an Organized Health Care Education/Training Program

## 2021-11-04 NOTE — Telephone Encounter (Signed)
Patient called to say he is going to a different clinic. Will not be coming back here for treatment.

## 2021-11-04 NOTE — Telephone Encounter (Signed)
Patient called back and told Juliann Pulse he would not be coming back here.

## 2021-11-08 ENCOUNTER — Other Ambulatory Visit: Payer: Self-pay | Admitting: Nurse Practitioner

## 2021-11-12 ENCOUNTER — Ambulatory Visit: Payer: Commercial Managed Care - HMO | Admitting: Nurse Practitioner

## 2021-11-23 ENCOUNTER — Other Ambulatory Visit: Payer: Self-pay | Admitting: Nurse Practitioner

## 2021-11-24 ENCOUNTER — Telehealth: Payer: Self-pay | Admitting: Nurse Practitioner

## 2021-11-24 NOTE — Telephone Encounter (Signed)
Ok thanks 

## 2021-11-24 NOTE — Telephone Encounter (Signed)
I don't see that he is on HCTZ. It is not in his med list. He has appointment next week and we can talk about this.

## 2021-11-24 NOTE — Telephone Encounter (Signed)
Patient requesting refill on his HCTZ. Please advise.

## 2021-11-24 NOTE — Telephone Encounter (Signed)
Sent new prescription for HCTZ to [facility]

## 2021-11-26 ENCOUNTER — Ambulatory Visit: Payer: Commercial Managed Care - HMO | Admitting: Nurse Practitioner

## 2021-11-29 ENCOUNTER — Encounter: Payer: Self-pay | Admitting: Nurse Practitioner

## 2021-11-29 ENCOUNTER — Ambulatory Visit (INDEPENDENT_AMBULATORY_CARE_PROVIDER_SITE_OTHER): Payer: Commercial Managed Care - HMO

## 2021-11-29 ENCOUNTER — Ambulatory Visit (INDEPENDENT_AMBULATORY_CARE_PROVIDER_SITE_OTHER): Payer: Commercial Managed Care - HMO | Admitting: Nurse Practitioner

## 2021-11-29 VITALS — BP 112/60 | HR 77 | Temp 98.1°F | Ht 74.0 in | Wt 240.8 lb

## 2021-11-29 DIAGNOSIS — Z5181 Encounter for therapeutic drug level monitoring: Secondary | ICD-10-CM

## 2021-11-29 DIAGNOSIS — Z7901 Long term (current) use of anticoagulants: Secondary | ICD-10-CM | POA: Diagnosis not present

## 2021-11-29 DIAGNOSIS — J449 Chronic obstructive pulmonary disease, unspecified: Secondary | ICD-10-CM

## 2021-11-29 DIAGNOSIS — G4733 Obstructive sleep apnea (adult) (pediatric): Secondary | ICD-10-CM

## 2021-11-29 DIAGNOSIS — I480 Paroxysmal atrial fibrillation: Secondary | ICD-10-CM | POA: Diagnosis not present

## 2021-11-29 DIAGNOSIS — F17210 Nicotine dependence, cigarettes, uncomplicated: Secondary | ICD-10-CM

## 2021-11-29 DIAGNOSIS — Z720 Tobacco use: Secondary | ICD-10-CM

## 2021-11-29 LAB — POCT INR: INR: 4.8 — AB (ref 2.0–3.0)

## 2021-11-29 MED ORDER — TRELEGY ELLIPTA 100-62.5-25 MCG/ACT IN AEPB: 1.0000 | INHALATION_SPRAY | Freq: Every day | RESPIRATORY_TRACT | 0 refills | Status: DC

## 2021-11-29 MED ORDER — TRELEGY ELLIPTA 100-62.5-25 MCG/ACT IN AEPB: 1.0000 | INHALATION_SPRAY | Freq: Every day | RESPIRATORY_TRACT | 5 refills | Status: DC

## 2021-11-29 NOTE — Assessment & Plan Note (Signed)
Intolerant of CPAP. Cautioned against drowsy driving.

## 2021-11-29 NOTE — Assessment & Plan Note (Signed)
Resolved AECOPD. He lives with a relatively high symptom burden, likely from inconsistent use of inhalers and continued smoking. He did notice improvement with Trelegy when compared to Anoro. Felt like his breathing and activity tolerance were better. We will restart him on Trelegy today - provided with samples and rx sent. Again reviewed importance of compliance with maintenance inhaler regimen. Continue PRN albuterol. Graded exercises encouraged.   Patient Instructions  Stop Anoro. Restart Trelegy 1 puff daily. Brush tongue and rinse mouth afterwards Continue Albuterol inhaler 2 puffs or 3 mL neb every 6 hours as needed for shortness of breath or wheezing. Notify if symptoms persist despite rescue inhaler/neb use.  Continue Mucinex [phone] mg Twice daily for chest congestion  Continue Flutter valve 2-3 times a day for chest congestion/cough. Use after your nebulizer.   Referred back to lung cancer screening program - you were due for CT chest in March so we will get you back in with them   Follow up in 3 months with Dr. Elsworth Soho. If symptoms do not improve or worsen, please contact office for sooner follow up or seek emergency care

## 2021-11-29 NOTE — Assessment & Plan Note (Addendum)
The patient's current tobacco use: 1 ppd The patient was advised to quit and impact of smoking on their health.  I assessed the patient's willingness to attempt to quit. I provided methods and skills for cessation. We reviewed medication management of smoking session drugs if appropriate. Resources to help quit smoking were provided. A smoking cessation quit date was set: December 30, 2021 The amount of time spent counseling patient was 4 mins   Discussed having a plan in place for quitting and when life stressors occur. He declined any pharmacological assistance at this time.   Previous LDCT chest without any pulmonary nodules. He was due in March 2023 for repeat scan. Referred him back to the lung cancer screening program today.

## 2021-11-29 NOTE — Progress Notes (Signed)
$'@Patient's$  ID: [name], male    DOB: [birth year], 65 y.o.   MRN: [id number]  Chief Complaint  Patient presents with   Follow-up    Patient here for a follow up. Patient states he in pain.     Referring provider: Ronnell Freshwater, NP  HPI: 66 year old male, current smoker followed for COPD and OSA intolerant of CPAP. He is a patient of Dr. Bari [name] and last seen in office 10/29/2021 by [facility] NP. Past medical history significant for A-fib status post ablation in 2016 and on chronic anticoagulation with Coumadin, hypertension, pulmonary hypertension, deviated septum, GERD, cirrhosis, DM 2, neuropathy, HLD, depression, CHF.  TEST/EVENTS:  09/16/2015 PFTs: FVC 64, FEV1 56, ratio 68. +BD 07/23/2020 lung cancer screening CT: Atherosclerosis and CAD.  No LAD is present.  There is mild centrilobular emphysema with diffuse bronchial wall thickening.  No significant pulmonary nodules were identified.  Diffuse hepatic steatosis.  Finally irregular liver surface, suggesting cirrhosis.  08/05/2021: OV with Dr. Elsworth [name].  Again educated on smoking cessation; reported that he was not ready to quit.  Previously been evaluated by orthopedics for lumbar radiculopathy.  He has severe pain from this and muscular weakness in all 4 extremities.  He also has worsening tremors with no evidence of cogwheel rigidity.  He does have a history of neck surgery in the past due to cervical disc disease.  He was referred to neurology at this visit.  Does have significant OSA but has been intolerant of CPAP therapy.  May be candidate for inspire device but advised that he needed to be evaluated for his pain and neurological situation first.  Only using Anoro sporadically for COPD; reeducated on purpose of maintenance therapy and encouraged to use daily.  10/29/2021: OV with Ryan Ivins NP after calling into the office earlier this week with complaints of shortness of breath.  He is caring for his wife who just came home from the hospital after  having knee surgery so he was unable to come in until today.  He has also noticed some drops in his oxygen into the 80s.  Does not use supplemental O2 at baseline.  Upon further review, it looks like he was seen in the ED for increased shortness of breath and productive cough on 5/31 and treated for acute bronchitis with Augmentin course and 10 mg of prednisone daily for 7 days.  No imaging or labs were obtained.  He was then seen at his PCPs office on 6/6; started on Z-Pak due to ongoing wheezing/rhonchi and cough. He then contacted their office on 6/14 reporting that he didn't feel like he was quite over the "pneumonia" and he was prescribed Ceftin, which he started yesterday.   Today, he reports still feeling short of breath and having drops in his oxygen at home, which are mainly in the morning. Lowest he has seen has been 87%. He feels like his cough has become less productive but he has terrible chest congestion, especially in the mornings, which makes it harder to breathe. He denies any fevers, hemoptysis, night sweats, lower extremity edema. Unresolved, persistent AECOPD.  CXR completed today did not show any evidence of superimposed infection.  Recommended that he complete Ceftin course as previously prescribed by his PCP.  We will start him on a prednisone taper.  Advised him to closely monitor his blood sugars and adjust insulin accordingly.  Step up to Trelegy.  We again discussed the importance of compliance with maintenance therapy - inconsistent with Anoro previously.  I recommended that he keep his AVS in a place where he can look at it in the morning and in the evening to ensure he has done his medications for the day.  Target mucociliary clearance therapies with Mucinex and flutter valve.  Advised that he use albuterol nebs twice daily until symptoms improve.  Walking oximetry today without desaturations on room air.  Close follow-up.  11/29/2021: Today - follow up Patient presents today for  overdue follow up. He reports feeling somewhat better. His breathing is still not as good as he would like it to be, but this is baseline for him. Cough and chest congestion have improved; still has an AM productive cough, which is chronic for him. He did feel like his breathing improved with Trelegy but he ran out of the samples and restarted his Anoro. He denies any hemoptysis, weight loss, fevers, night sweats, leg swelling. His wife is recovering well from her surgery so he feels like he has been focusing on his own medicines more. He does continue to smoke, 1 ppd. He wants to quit but does not want pharmacological help as he already feels like he's on a lot of medications. He says that he was an alcoholic previously and was able to quit without any help, so he is going to do the same thing with smoking.   Allergies  Allergen Reactions   Adhesive [Tape]     itching   Latex Itching    When tape is on the skin too long skin gets red & itching    Immunization History  Administered Date(s) Administered   Influenza Split 01/30/2011, 03/05/2012, 02/13/2013, 02/03/2016, 02/20/2019   Influenza Whole 02/13/2009   Influenza,inj,Quad PF,6+ Mos 03/14/2014, 04/22/2015, 01/19/2016, 02/02/2017, 02/05/2019, 05/01/2020, 04/19/2021   Influenza-Unspecified 01/30/2011, 03/05/2012, 02/13/2013, 03/14/2014, 04/22/2015, 01/19/2016, 02/03/2016, 02/02/2017, 02/13/2018   Pneumococcal Polysaccharide-23 02/14/2007    Past Medical History:  Diagnosis Date   Arthritis    lower back   Asthma    Atrial fibrillation ([place])    Atrial flutter ([place])    s/p CTI ablation by Dr Rayann Heman   Brain aneurysm 2009   questionable. A follow up CTA in 2009 showed no evidence of   Chronic back pain    DDD/stenosis   Colon polyps    9 polyps removed 09/23/23   Complication of anesthesia 09/11/2012   slow to awaken after ablation   COPD (chronic obstructive pulmonary disease) ([place])    Diabetes mellitus    takes Metformin and  Glimepiride daily   Emphysema    GERD (gastroesophageal reflux disease)    takes Omeprazole bid   Heart failure ([place])    History of shingles    HLD (hyperlipidemia)    takes Pravastatin daily   HTN (hypertension)    takes Prinizide daily   Obesity    OSA (obstructive sleep apnea)    not always using cpap   Overdose 2009   unintentional Flecanide overdose   Peripheral neuropathy    Short-term memory loss    Tobacco abuse     Tobacco History: Social History   Tobacco Use  Smoking Status Every Day   Packs/day: 1.00   Years: 49.00   Total pack years: 49.00   Types: Cigarettes  Smokeless Tobacco Never  Tobacco Comments   Smoking a half a pack per day as of 10/29/21 hfb   Ready to quit: Not Answered Counseling given: Not Answered Tobacco comments: Smoking a half a pack per day as of 10/29/21 hfb  Outpatient Medications Prior to Visit  Medication Sig Dispense Refill   albuterol (PROAIR HFA) 108 (90 Base) MCG/ACT inhaler Inhale 1-2 puffs into the lungs every 6 (six) hours as needed for wheezing or shortness of breath. 18 g 5   albuterol (PROVENTIL) (2.5 MG/3ML) 0.083% nebulizer solution Take 3 mLs (2.5 mg total) by nebulization every 6 (six) hours as needed for wheezing or shortness of breath. 75 mL 12   b complex vitamins tablet Take 1 tablet by mouth daily.      bisoprolol (ZEBETA) 10 MG tablet Take 10 mg by mouth daily.      cefUROXime (CEFTIN) 500 MG tablet Take 1 tablet (500 mg total) by mouth 2 (two) times daily with a meal. 14 tablet 0   Continuous Blood Gluc Receiver (DEXCOM G6 RECEIVER) DEVI      Continuous Blood Gluc Sensor (DEXCOM G6 SENSOR) MISC USE AS DIRECTED 9 each 3   Continuous Blood Gluc Transmit (DEXCOM G6 TRANSMITTER) MISC USE AS DIRECTED 1 each 3   diltiazem (CARDIZEM CD) 120 MG 24 hr capsule Take 1 capsule (120 mg total) by mouth daily. 90 capsule 1   Dulaglutide (TRULICITY) 66 YO/3.7CH SOPN Inject 3 mg as directed once a week. 6 mL 3   gabapentin  (NEURONTIN) 300 MG capsule Take 300 mg by mouth See admin instructions. Take 1 capsule (300 mg) by mouth in the morning & take 3 capsules (900 mg) by mouth at night     HUMALOG KWIKPEN 100 UNIT/ML KwikPen Inject 0-60 Units into the skin 3 (three) times daily with meals. 135 mL 3   Insulin Pen Needle (EASY COMFORT PEN NEEDLES) 31G X 5 MM MISC To use with as needed insulin dosing up to four times daily as needed for hyperglycemia 300 each 3   losartan (COZAAR) 50 MG tablet TAKE 1 TABLET BY MOUTH DAILY 90 tablet 3   metolazone (ZAROXOLYN) 2.5 MG tablet TAKE 1 TABLET BY MOUTH ON TUESDAY AND THURSDAY 30 MINUTES BEFORE TAKING TORSEMIDE OR AS DIRECTED 15 tablet 3   omeprazole (PRILOSEC) 20 MG capsule Take 1 capsule (20 mg total) by mouth 2 (two) times daily. 60 capsule 11   potassium chloride SA (KLOR-CON M) 20 MEQ tablet TAKE 1 TABLET BY MOUTH DAILY. TAKE AN EXTRA TABLET ON TUESDAY AND THURSDAY 40 tablet 6   pravastatin (PRAVACHOL) 80 MG tablet TAKE 1 TABLET BY MOUTH DAILY AT BEDTIME 30 tablet 10   predniSONE (DELTASONE) 10 MG tablet 4 tabs for 3 days, then 3 tabs for 3 days, 2 tabs for 3 days, then 1 tab for 3 days, then stop 30 tablet 0   tamsulosin (FLOMAX) 0.4 MG CAPS capsule TAKE 1 CAPSULE BY MOUTH EACH NIGHT AT BEDTIME 30 capsule 11   torsemide (DEMADEX) 20 MG tablet Take 2 tablets in the morning and 1 tablet in the PM 270 tablet 3   triamcinolone cream (KENALOG) 0.1 % Apply 1 application topically daily as needed. 453 g 2   VITAMIN A PO Take 2,400 mcg by mouth daily.      Vitamin D, Ergocalciferol, (DRISDOL) 1.25 MG (50000 UNIT) CAPS capsule TAKE 1 CAPSULE BY MOUTH EVERY 7 DAYS 12 capsule 3   warfarin (COUMADIN) 5 MG tablet TAKE 1 TO 1 AND 1/2 TABLETS BY MOUTH DAILY AS DIRECTED BY THE COUMADIN CLINIC 40 tablet 1   Fluticasone-Umeclidin-Vilant (TRELEGY ELLIPTA) 100-62.5-25 MCG/ACT AEPB Inhale 1 puff into the lungs daily. 60 each 0   umeclidinium-vilanterol (ANORO ELLIPTA) 62.5-25 MCG/INH AEPB Inhale  1 puff into the lungs daily. (Patient taking differently: Inhale 1 puff into the lungs daily as needed (respiratory issues.).) 60 each 5   colchicine 0.6 MG tablet Take 2 tablet po once. Then take 1 tablet po one hour later. Wait 2 days then begin taking 1 tablet po QD to prevent acute gout. (Patient not taking: Reported on 10/29/2021) 33 tablet 2   cyclobenzaprine (FLEXERIL) 10 MG tablet Take 10 mg by mouth 3 (three) times daily. (Patient not taking: Reported on 10/29/2021)     nitroGLYCERIN (NITROSTAT) 0.4 MG SL tablet Place 1 tablet (0.4 mg total) under the tongue every 5 (five) minutes x 3 doses as needed for chest pain. (Patient not taking: Reported on 10/29/2021) 25 tablet 3   oxyCODONE-acetaminophen (PERCOCET) 10-325 MG tablet Take 1.5 tablets by mouth in the morning, at noon, in the evening, and at bedtime. (Patient not taking: Reported on 10/29/2021)     No facility-administered medications prior to visit.     Review of Systems:   Constitutional: No weight loss or gain, night sweats, fevers, chills. +fatigue (improved but chronic) HEENT: No headaches, difficulty swallowing, tooth/dental problems, or sore throat. No sneezing, itching, ear ache, nasal congestion, or post nasal drip CV:  No chest pain, orthopnea, PND, swelling in lower extremities, anasarca, dizziness, palpitations, syncope Resp: +shortness of breath with exertion (baseline; better on Trelegy), productive AM cough (chronic), rare wheeze. No hemoptysis.No chest wall deformity GI:  No heartburn, indigestion, abdominal pain, nausea, vomiting, diarrhea, change in bowel habits, loss of appetite, bloody stools.  GU: No dysuria, change in color of urine, urgency or frequency.  No flank pain, no hematuria  Skin: No rash, lesions, ulcerations MSK:  +chronic joint and back pain; limited upper extremity movement. Ambulates with a walker. No joint swelling Neuro: No dizziness or lightheadedness. +tremor (baseline) Psych: No depression or  anxiety. Mood stable.     Physical Exam:  BP 112/60 (BP Location: Right Arm, Patient Position: Sitting, Cuff Size: Normal)   Pulse 77   Temp 98.1 F (36.7 C) (Oral)   Ht '6\' 2"'$  (1.88 m)   Wt 240 lb 12.8 oz (109.2 kg)   SpO2 94%   BMI 30.92 kg/m   GEN: Pleasant, interactive, chronically-ill appearing; obese; in no acute distress. HEENT:  Normocephalic and atraumatic. PERRLA. Sclera white. Nasal turbinates pink, moist and patent bilaterally. No rhinorrhea present. Oropharynx pink and moist, without exudate or edema. No lesions, ulcerations, or postnasal drip.  NECK:  Supple w/ fair ROM. No JVD present. Normal carotid impulses w/o bruits. Thyroid symmetrical with no goiter or nodules palpated. No lymphadenopathy.   CV: RRR, no m/r/g, no peripheral edema. Pulses intact, +2 bilaterally. No cyanosis, pallor or clubbing. PULMONARY:  Unlabored, regular breathing. Diminished bases bilaterally otherwise clear A&P w/o wheezes/rales/rhonchi. No accessory muscle use. No dullness to percussion. GI: BS present and normoactive. Soft, non-tender to palpation. No organomegaly or masses detected. No CVA tenderness. MSK: No erythema, warmth or tenderness. Cap refil <2 sec all extrem. No deformities or joint swelling noted.  Neuro: A/Ox3. No focal deficits noted.   Skin: Warm, no lesions or rashe Psych: Normal affect and behavior. Judgement and thought content appropriate.     Lab Results:  CBC    Component Value Date/Time   WBC 5.4 08/12/2021 1058   WBC 5.5 04/23/2021 0800   RBC 3.59 (L) 08/12/2021 1058   RBC 3.55 (L) 04/23/2021 0800   HGB 11.0 (L) 08/12/2021 1058   HCT 32.9 (L) 08/12/2021 1058  PLT 125 (L) 08/12/2021 1058   MCV 92 08/12/2021 1058   MCH 30.6 08/12/2021 1058   MCH 31.0 04/23/2021 0800   MCHC 33.4 08/12/2021 1058   MCHC 32.0 04/23/2021 0800   RDW 16.8 (H) 08/12/2021 1058   LYMPHSABS 1,282 10/22/2020 1154   LYMPHSABS 1.2 09/24/2019 1449   MONOABS 0.5 12/16/2019 1523    EOSABS 112 10/22/2020 1154   EOSABS 0.1 09/24/2019 1449   BASOSABS 22 10/22/2020 1154   BASOSABS 0.0 09/24/2019 1449    BMET    Component Value Date/Time   NA 142 08/12/2021 1058   K 3.5 08/12/2021 1058   CL 98 08/12/2021 1058   CO2 29 08/12/2021 1058   GLUCOSE 295 (H) 08/12/2021 1058   GLUCOSE 131 (H) 04/23/2021 0800   BUN 18 08/12/2021 1058   CREATININE 1.26 08/12/2021 1058   CREATININE 0.69 (L) 09/14/2016 0858   CALCIUM 8.9 08/12/2021 1058   GFRNONAA >60 04/23/2021 0800   GFRAA 69 05/07/2020 1010    BNP    Component Value Date/Time   BNP 90.8 01/06/2021 1020   BNP 46.1 03/06/2016 2257   BNP 143.6 (H) 04/28/2015 1055     Imaging:  No results found.        Latest Ref Rng & Units 09/16/2015    9:46 AM  PFT Results  FVC-Pre L 3.57  P  FVC-Predicted Pre % 64  P  FVC-Post L 4.09  P  FVC-Predicted Post % 73  P  Pre FEV1/FVC % % 67  P  Post FEV1/FCV % % 68  P  FEV1-Pre L 2.39  P  FEV1-Predicted Pre % 56  P  FEV1-Post L 2.79  P    P Preliminary result    No results found for: "NITRICOXIDE"      Assessment & Plan:   Moderate COPD (chronic obstructive pulmonary disease) (HCC) Resolved AECOPD. He lives with a relatively high symptom burden, likely from inconsistent use of inhalers and continued smoking. He did notice improvement with Trelegy when compared to Anoro. Felt like his breathing and activity tolerance were better. We will restart him on Trelegy today - provided with samples and rx sent. Again reviewed importance of compliance with maintenance inhaler regimen. Continue PRN albuterol. Graded exercises encouraged.   Patient Instructions  Stop Anoro. Restart Trelegy 1 puff daily. Brush tongue and rinse mouth afterwards Continue Albuterol inhaler 2 puffs or 3 mL neb every 6 hours as needed for shortness of breath or wheezing. Notify if symptoms persist despite rescue inhaler/neb use.  Continue Mucinex [phone] mg Twice daily for chest congestion   Continue Flutter valve 2-3 times a day for chest congestion/cough. Use after your nebulizer.   Referred back to lung cancer screening program - you were due for CT chest in March so we will get you back in with them   Follow up in 3 months with Dr. Elsworth [name]. If symptoms do not improve or worsen, please contact office for sooner follow up or seek emergency care    Tobacco abuse The patient's current tobacco use: 1 ppd The patient was advised to quit and impact of smoking on their health.  I assessed the patient's willingness to attempt to quit. I provided methods and skills for cessation. We reviewed medication management of smoking session drugs if appropriate. Resources to help quit smoking were provided. A smoking cessation quit date was set: December 30, 2021 The amount of time spent counseling patient was 4 mins   Discussed having a plan  in place for quitting and when life stressors occur. He declined any pharmacological assistance at this time.   Previous LDCT chest without any pulmonary nodules. He was due in March 2023 for repeat scan. Referred him back to the lung cancer screening program today.   OSA (obstructive sleep apnea) Intolerant of CPAP. Cautioned against drowsy driving.     I spent 32 minutes of dedicated to the care of this patient on the date of this encounter to include pre-visit review of records, face-to-face time with the patient discussing conditions above, post visit ordering of testing, clinical documentation with the electronic health record, making appropriate referrals as documented, and communicating necessary findings to members of the patients care team.  Clayton Bibles, NP 11/29/2021  Pt aware and understands NP's role.

## 2021-11-29 NOTE — Patient Instructions (Addendum)
Stop Anoro. Restart Trelegy 1 puff daily. Brush tongue and rinse mouth afterwards Continue Albuterol inhaler 2 puffs or 3 mL neb every 6 hours as needed for shortness of breath or wheezing. Notify if symptoms persist despite rescue inhaler/neb use.  Continue Mucinex [phone] mg Twice daily for chest congestion  Continue Flutter valve 2-3 times a day for chest congestion/cough. Use after your nebulizer.   Referred back to lung cancer screening program - you were due for CT chest in March so we will get you back in with them   Follow up in 3 months with Dr. Elsworth Soho. If symptoms do not improve or worsen, please contact office for sooner follow up or seek emergency care

## 2021-11-29 NOTE — Addendum Note (Signed)
Addended by: Fran Lowes on: 11/29/2021 10:50 AM   Modules accepted: Orders

## 2021-11-29 NOTE — Patient Instructions (Signed)
Hold today and Tuesday dose and then continue taking 1 tablet daily except for 1/2 tablet every Monday, Wednesday and Friday. [phone] Repeat INR  in 2 weeks.

## 2021-11-30 ENCOUNTER — Ambulatory Visit (INDEPENDENT_AMBULATORY_CARE_PROVIDER_SITE_OTHER): Payer: Commercial Managed Care - HMO | Admitting: Nurse Practitioner

## 2021-11-30 ENCOUNTER — Encounter: Payer: Self-pay | Admitting: Nurse Practitioner

## 2021-11-30 VITALS — BP 124/57 | HR 72 | Ht 74.02 in | Wt 240.8 lb

## 2021-11-30 DIAGNOSIS — Z794 Long term (current) use of insulin: Secondary | ICD-10-CM

## 2021-11-30 DIAGNOSIS — R160 Hepatomegaly, not elsewhere classified: Secondary | ICD-10-CM

## 2021-11-30 DIAGNOSIS — E119 Type 2 diabetes mellitus without complications: Secondary | ICD-10-CM | POA: Diagnosis not present

## 2021-11-30 DIAGNOSIS — L509 Urticaria, unspecified: Secondary | ICD-10-CM

## 2021-11-30 DIAGNOSIS — R079 Chest pain, unspecified: Secondary | ICD-10-CM

## 2021-11-30 DIAGNOSIS — G894 Chronic pain syndrome: Secondary | ICD-10-CM

## 2021-11-30 LAB — POCT GLYCOSYLATED HEMOGLOBIN (HGB A1C): Hemoglobin A1C: 7.7 % — AB (ref 4.0–5.6)

## 2021-11-30 MED ORDER — GABAPENTIN 300 MG PO CAPS: ORAL_CAPSULE | ORAL | 1 refills | Status: DC

## 2021-11-30 MED ORDER — TRIAMCINOLONE ACETONIDE 0.1 % EX CREA: 1.0000 | TOPICAL_CREAM | Freq: Every day | CUTANEOUS | 2 refills | Status: AC | PRN

## 2021-11-30 NOTE — Progress Notes (Signed)
Established patient visit   Patient: [name]   DOB: June 20, 1955   66 y.o. Male  MRN: [id number] Visit Date: 11/30/2021   Chief Complaint  Patient presents with   Diabetes   Subjective    HPI  Follow up diabetes  -due to have check of HgbA1c. Today is 7.7.  was 7.6 at last check and 8.1 the time before that. Has had episode of severe hypoglycemia in past month. Now, not taking much of his insulin.  -needs to be referred for diabetic eye exam  -having some chest discomfort. Does see cardiology due to PAF.  -having generalized joint pain  --has increased gabapentin to 3 in AM and 3 in PM. Does see orthopedics and is seeing pain management provider.  -chronic obstructive pulmonary disease.  --sees pulmonology    Medications: Outpatient Medications Prior to Visit  Medication Sig   albuterol (PROAIR HFA) 108 (90 Base) MCG/ACT inhaler Inhale 1-2 puffs into the lungs every 6 (six) hours as needed for wheezing or shortness of breath.   albuterol (PROVENTIL) (2.5 MG/3ML) 0.083% nebulizer solution Take 3 mLs (2.5 mg total) by nebulization every 6 (six) hours as needed for wheezing or shortness of breath.   b complex vitamins tablet Take 1 tablet by mouth daily.    bisoprolol (ZEBETA) 10 MG tablet Take 10 mg by mouth daily.    Continuous Blood Gluc Receiver (DEXCOM G6 RECEIVER) DEVI    Continuous Blood Gluc Sensor (DEXCOM G6 SENSOR) MISC USE AS DIRECTED   Continuous Blood Gluc Transmit (DEXCOM G6 TRANSMITTER) MISC USE AS DIRECTED   diltiazem (CARDIZEM CD) 120 MG 24 hr capsule Take 1 capsule (120 mg total) by mouth daily.   Dulaglutide (TRULICITY) 3 YT/0.1SW SOPN Inject 3 mg as directed once a week.   HUMALOG KWIKPEN 100 UNIT/ML KwikPen Inject 0-60 Units into the skin 3 (three) times daily with meals.   Insulin Pen Needle (EASY COMFORT PEN NEEDLES) 31G X 5 MM MISC To use with as needed insulin dosing up to four times daily as needed for hyperglycemia   losartan (COZAAR) 50 MG tablet TAKE  1 TABLET BY MOUTH DAILY   metolazone (ZAROXOLYN) 2.5 MG tablet TAKE 1 TABLET BY MOUTH ON TUESDAY AND THURSDAY 30 MINUTES BEFORE TAKING TORSEMIDE OR AS DIRECTED   omeprazole (PRILOSEC) 20 MG capsule Take 1 capsule (20 mg total) by mouth 2 (two) times daily.   potassium chloride SA (KLOR-CON M) 20 MEQ tablet TAKE 1 TABLET BY MOUTH DAILY. TAKE AN EXTRA TABLET ON TUESDAY AND THURSDAY   pravastatin (PRAVACHOL) 80 MG tablet TAKE 1 TABLET BY MOUTH DAILY AT BEDTIME   predniSONE (DELTASONE) 10 MG tablet 4 tabs for 3 days, then 3 tabs for 3 days, 2 tabs for 3 days, then 1 tab for 3 days, then stop   tamsulosin (FLOMAX) 0.4 MG CAPS capsule TAKE 1 CAPSULE BY MOUTH EACH NIGHT AT BEDTIME   torsemide (DEMADEX) 20 MG tablet Take 2 tablets in the morning and 1 tablet in the PM   VITAMIN A PO Take 2,400 mcg by mouth daily.    Vitamin D, Ergocalciferol, (DRISDOL) 1.25 MG (50000 UNIT) CAPS capsule TAKE 1 CAPSULE BY MOUTH EVERY 7 DAYS   warfarin (COUMADIN) 5 MG tablet TAKE 1 TO 1 AND 1/2 TABLETS BY MOUTH DAILY AS DIRECTED BY THE COUMADIN CLINIC   [DISCONTINUED] cefUROXime (CEFTIN) 500 MG tablet Take 1 tablet (500 mg total) by mouth 2 (two) times daily with a meal.   [DISCONTINUED] Fluticasone-Umeclidin-Vilant (TRELEGY  ELLIPTA) 100-62.5-25 MCG/ACT AEPB Inhale 1 puff into the lungs daily.   [DISCONTINUED] Fluticasone-Umeclidin-Vilant (TRELEGY ELLIPTA) 100-62.5-25 MCG/ACT AEPB Inhale 1 puff into the lungs daily.   [DISCONTINUED] gabapentin (NEURONTIN) 300 MG capsule Take 300 mg by mouth See admin instructions. Take 1 capsule (300 mg) by mouth in the morning & take 3 capsules (900 mg) by mouth at night   [DISCONTINUED] triamcinolone cream (KENALOG) 0.1 % Apply 1 application topically daily as needed.   colchicine 0.6 MG tablet Take 2 tablet po once. Then take 1 tablet po one hour later. Wait 2 days then begin taking 1 tablet po QD to prevent acute gout. (Patient not taking: Reported on 10/29/2021)   cyclobenzaprine  (FLEXERIL) 10 MG tablet Take 10 mg by mouth 3 (three) times daily. (Patient not taking: Reported on 10/29/2021)   nitroGLYCERIN (NITROSTAT) 0.4 MG SL tablet Place 1 tablet (0.4 mg total) under the tongue every 5 (five) minutes x 3 doses as needed for chest pain. (Patient not taking: Reported on 10/29/2021)   oxyCODONE-acetaminophen (PERCOCET) 10-325 MG tablet Take 1.5 tablets by mouth in the morning, at noon, in the evening, and at bedtime. (Patient not taking: Reported on 10/29/2021)   No facility-administered medications prior to visit.    Review of Systems  Constitutional:  Positive for fatigue. Negative for activity change, chills and fever.  HENT:  Negative for congestion, postnasal drip, rhinorrhea, sinus pressure, sinus pain, sneezing and sore throat.   Eyes: Negative.   Respiratory:  Positive for shortness of breath and wheezing. Negative for cough.        Patient does have moderate Copd and sees pulmonology provider on routine basis.   Cardiovascular:  Positive for chest pain and palpitations.  Gastrointestinal:  Negative for constipation, diarrhea, nausea and vomiting.  Endocrine: Negative for cold intolerance, heat intolerance, polydipsia and polyuria.       Blood sugars are stable.   Genitourinary:  Negative for dysuria, frequency and urgency.  Musculoskeletal:  Positive for arthralgias, joint swelling and myalgias. Negative for back pain.  Skin:  Negative for rash.  Allergic/Immunologic: Negative for environmental allergies.  Neurological:  Negative for dizziness, weakness and headaches.  Psychiatric/Behavioral:  The patient is not nervous/anxious.       Objective     Today's Vitals   11/30/21 1036 11/30/21 1127  BP: (!) 114/56 (!) 124/57  Pulse: 72   SpO2: 95%   Weight: 240 lb 12.8 oz (109.2 kg)   Height: 6' 2.02" (1.88 m)    Body mass index is 30.9 kg/m.   BP Readings from Last 3 Encounters:  11/30/21 (!) 124/57  11/29/21 112/60  10/29/21 (!) 100/50    Wt  Readings from Last 3 Encounters:  11/30/21 240 lb 12.8 oz (109.2 kg)  11/29/21 240 lb 12.8 oz (109.2 kg)  10/29/21 243 lb 6.4 oz (110.4 kg)    Physical Exam Vitals and nursing note reviewed.  Constitutional:      Appearance: Normal appearance. He is well-developed.  HENT:     Head: Normocephalic and atraumatic.     Nose: Nose normal.     Mouth/Throat:     Mouth: Mucous membranes are moist.     Pharynx: Oropharynx is clear.  Eyes:     Extraocular Movements: Extraocular movements intact.     Conjunctiva/sclera: Conjunctivae normal.     Pupils: Pupils are equal, round, and reactive to light.  Cardiovascular:     Rate and Rhythm: Normal rate and regular rhythm.  Pulses: Normal pulses.     Heart sounds: Normal heart sounds.     Comments: ECG showing no significant changes from prior ECGs.  Pulmonary:     Effort: Pulmonary effort is normal.     Breath sounds: Wheezing and rhonchi present.  Abdominal:     General: Bowel sounds are normal.     Palpations: Abdomen is soft.     Tenderness: There is abdominal tenderness.     Comments: Patient does have some abdominal discomfort.   Musculoskeletal:        General: Normal range of motion.     Cervical back: Normal range of motion and neck supple.  Lymphadenopathy:     Cervical: No cervical adenopathy.  Skin:    General: Skin is warm and dry.     Capillary Refill: Capillary refill takes less than 2 seconds.  Neurological:     General: No focal deficit present.     Mental Status: He is alert and oriented to person, place, and time.  Psychiatric:        Mood and Affect: Mood normal.        Behavior: Behavior normal.        Thought Content: Thought content normal.        Judgment: Judgment normal.     Results for orders placed or performed in visit on 11/30/21  POCT glycosylated hemoglobin (Hb A1C)  Result Value Ref Range   Hemoglobin A1C 7.7 (A) 4.0 - 5.6 %   HbA1c POC (<> result, manual entry)     HbA1c, POC (prediabetic  range)     HbA1c, POC (controlled diabetic range)      Assessment & Plan    1. 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 Encourage him to be more strict with mealtime coverage of regular insulin to better control blood sugars. Continue all diabetic medication as prescribed.  - POCT glycosylated hemoglobin (Hb A1C) - gabapentin (NEURONTIN) 300 MG capsule; Take 3 capsules bid for neuropathy  Dispense: 540 capsule; Refill: 1  2. Chest pain, unspecified type ECG showing no significant changes since most recent ECG. He will continue to see cardiology on routine basis.  - EKG 12-Lead  3. Urticaria Use triamcinolone 0.1% cream on all affected areas up to twice daily as needed. - triamcinolone cream (KENALOG) 0.1 %; Apply 1 Application topically daily as needed.  Dispense: 453 g; Refill: 2  4. Hepatomegaly Information provided for patient to contact GI provider he has previously been referred to.  5. Chronic pain syndrome Patient now seeing pain management provider.  Problem List Items Addressed This Visit       Digestive   Hepatomegaly     Endocrine   Insulin dependent type 2 diabetes mellitus ([place]) - Primary   Relevant Medications   gabapentin (NEURONTIN) 300 MG capsule   Other Relevant Orders   POCT glycosylated hemoglobin (Hb A1C) (Completed)     Musculoskeletal and Integument   Urticaria   Relevant Medications   triamcinolone cream (KENALOG) 0.1 %     Other   Chest pain   Relevant Orders   EKG 12-Lead (Completed)   Chronic pain syndrome   Relevant Medications   gabapentin (NEURONTIN) 300 MG capsule     Return in about 3 months (around 03/02/2022) for diabetes with HgbA1c check.         Ronnell Freshwater, NP  [facility] at [facility] [phone] (phone) [phone] (fax)  [facility]

## 2021-11-30 NOTE — Progress Notes (Signed)
Reviewed with patient during visit

## 2021-12-01 ENCOUNTER — Telehealth: Payer: Self-pay | Admitting: Cardiology

## 2021-12-01 NOTE — Telephone Encounter (Signed)
Patient saw pulmonary 2 days ago (11/29/21) and this is there note:by Claudia Pollock, NP:  Resolved AECOPD. He lives with a relatively high symptom burden, likely from inconsistent use of inhalers and continued smoking. He did notice improvement with Trelegy when compared to Anoro. Felt like his breathing and activity tolerance were better. We will restart him on Trelegy today - provided with samples and rx sent. Again reviewed importance of compliance with maintenance inhaler regimen. Continue PRN albuterol. Graded exercises encouraged.    Patient Instructions  Stop Anoro. Restart Trelegy 1 puff daily. Brush tongue and rinse mouth afterwards Continue Albuterol inhaler 2 puffs or 3 mL neb every 6 hours as needed for shortness of breath or wheezing. Notify if symptoms persist despite rescue inhaler/neb use.  Continue Mucinex [phone] mg Twice daily for chest congestion  Continue Flutter valve 2-3 times a day for chest congestion/cough. Use after your nebulizer.    Referred back to lung cancer screening program - you were due for CT chest in March so we will get you back in with them    Follow up in 3 months with Dr. Elsworth Soho. If symptoms do not improve or worsen, please contact office for sooner follow up or seek emergency care       He has not made med changes yet from 7/17 visit.  Patient states he has had productive cough for months which he describes at yellow/brown.Denies fever.He has had ongoing back pain and could not get chest CT. He talks about black mold in a closet at home that he has not had re mediated yet.   His Dexcom 6 for his blood glucose monitoring all over the place, 200 last night with sweating to too low and he had passed out due to low readings the other day.    He was told by pulmonary to let us know his situation.      I will FYI D.Lonia Blood, RN and Dr.Hochrein

## 2021-12-01 NOTE — Telephone Encounter (Signed)
Pt c/o BP issue: STAT if pt c/o blurred vision, one-sided weakness or slurred speech  1. What are your last 5 BP readings?  117/59   2. Are you having any other symptoms (ex. Dizziness, headache, blurred vision, passed out)? SOB and weak  3. What is your BP issue?  Pt states that he is having issues with breathing with the change of his bp.   Pt c/o Shortness Of Breath: STAT if SOB developed within the last 24 hours or pt is noticeably SOB on the phone  1. Are you currently SOB (can you hear that pt is SOB on the phone)? Yes  2. How long have you been experiencing SOB? Going on for awhile, but now worse.  3. Are you SOB when sitting or when up moving around? Both  4. Are you currently experiencing any other symptoms? Weakness.

## 2021-12-02 ENCOUNTER — Ambulatory Visit (INDEPENDENT_AMBULATORY_CARE_PROVIDER_SITE_OTHER): Payer: Commercial Managed Care - HMO | Admitting: Podiatry

## 2021-12-02 DIAGNOSIS — M79675 Pain in left toe(s): Secondary | ICD-10-CM

## 2021-12-02 DIAGNOSIS — M79676 Pain in unspecified toe(s): Secondary | ICD-10-CM | POA: Diagnosis not present

## 2021-12-02 DIAGNOSIS — B351 Tinea unguium: Secondary | ICD-10-CM | POA: Diagnosis not present

## 2021-12-02 NOTE — Progress Notes (Signed)
  Subjective:  Patient ID: [name], male    DOB: 1955/11/23,  MRN: [id number]  Chief Complaint  Patient presents with   Diabetes    Diabetic foot care   Nail Problem    Thick painful toenails   Ingrown Toenail    Left great toenail medial border, feels like a piece of the nail is stabbing the side of his toe    65 y.o. male presents with the above complaint. History confirmed with patient.  He has previously had the left hallux nail removed, he feels like there is a piece still stuck in there.  Denies any trauma or injury  Objective:  Physical Exam: warm, good capillary refill, no trophic changes or ulcerative lesions, and normal DP and PT pulses. Left Foot: dystrophic yellowed discolored nail plates with subungual debris and hallux nail has been removed, there is no visible nail regrowth there is slight tenderness to the tip of the toe Right Foot: dystrophic yellowed discolored nail plates with subungual debris and some ecchymosis under hallux nail Assessment:   1. Pain due to onychomycosis of toenail   2. Pain in toe of left foot      Plan:  Patient was evaluated and treated and all questions answered.  Discussed the etiology and treatment options for the condition in detail with the patient. Educated patient on the topical and oral treatment options for mycotic nails. Recommended debridement of the nails today. Sharp and mechanical debridement performed of all painful and mycotic nails today. Nails debrided in length and thickness using a nail nipper to level of comfort. Discussed treatment options including appropriate shoe gear. Follow up as needed for painful nails.  I discussed with him that I think most of his pain is due to pressure from shoe gear or on the bone.  I dispensed him silicone pads to offload the hallux.  There is no evidence of recurrent nail regrowth or ingrown nail on the left hallux so there is not any procedure in particular that I would recommend to  remove any further tissue.  Return if symptoms worsen or fail to improve.

## 2021-12-03 ENCOUNTER — Telehealth: Payer: Self-pay | Admitting: Nurse Practitioner

## 2021-12-03 DIAGNOSIS — J449 Chronic obstructive pulmonary disease, unspecified: Secondary | ICD-10-CM

## 2021-12-03 NOTE — Telephone Encounter (Signed)
Called patient and he states that insurance is not covering Trelegy.   Can we run a ticket on him and see if the Trelegy needs a PA. Or what is covered under his insurance.   Thanks

## 2021-12-03 NOTE — Telephone Encounter (Signed)
Insurance will not cover Trelegy, needs an alternative. Uses [facility]. Call back [phone]

## 2021-12-06 ENCOUNTER — Other Ambulatory Visit (HOSPITAL_COMMUNITY): Payer: Self-pay

## 2021-12-06 MED ORDER — INCRUSE ELLIPTA 62.5 MCG/ACT IN AEPB: 1.0000 | INHALATION_SPRAY | Freq: Every day | RESPIRATORY_TRACT | 5 refills | Status: DC

## 2021-12-06 MED ORDER — FLUTICASONE-SALMETEROL 100-50 MCG/ACT IN AEPB: 1.0000 | INHALATION_SPRAY | Freq: Two times a day (BID) | RESPIRATORY_TRACT | 5 refills | Status: DC

## 2021-12-06 NOTE — Telephone Encounter (Signed)
Katie,  Test claims indicate Trelegy is not covered by this insurance.  Advair and Incruse are both covered on this plan.   Please advise

## 2021-12-06 NOTE — Telephone Encounter (Signed)
Please notify patient Trelegy is not covered by his insurance and that I have sent in rx for Advair diskus 1 puff Twice daily. Brush tongue and rinse mouth afterwards. And Incruse 1 puff daily. He will need to use both inhalers to accomplish triple therapy. Thanks

## 2021-12-06 NOTE — Telephone Encounter (Signed)
Called patient and left voicemail for him about new medications and how to use the inhalers. Nothing further needed

## 2021-12-09 ENCOUNTER — Ambulatory Visit: Payer: Managed Care, Other (non HMO) | Admitting: Nurse Practitioner

## 2021-12-13 ENCOUNTER — Ambulatory Visit (INDEPENDENT_AMBULATORY_CARE_PROVIDER_SITE_OTHER): Payer: Commercial Managed Care - HMO

## 2021-12-13 DIAGNOSIS — Z7901 Long term (current) use of anticoagulants: Secondary | ICD-10-CM | POA: Diagnosis not present

## 2021-12-13 DIAGNOSIS — I480 Paroxysmal atrial fibrillation: Secondary | ICD-10-CM

## 2021-12-13 DIAGNOSIS — I4892 Unspecified atrial flutter: Secondary | ICD-10-CM | POA: Diagnosis not present

## 2021-12-13 LAB — POCT INR: INR: 3 (ref 2.0–3.0)

## 2021-12-13 NOTE — Patient Instructions (Signed)
Description   Eat a serving of greens today and continue taking 1 tablet daily except for 1/2 tablet every Monday, Wednesday and Friday. [phone] Repeat INR  in 3 weeks.

## 2021-12-14 ENCOUNTER — Ambulatory Visit: Payer: Commercial Managed Care - HMO | Admitting: Podiatry

## 2021-12-14 DIAGNOSIS — L03032 Cellulitis of left toe: Secondary | ICD-10-CM | POA: Diagnosis not present

## 2021-12-14 DIAGNOSIS — I739 Peripheral vascular disease, unspecified: Secondary | ICD-10-CM

## 2021-12-14 MED ORDER — DOXYCYCLINE HYCLATE 100 MG PO TABS: 100.0000 mg | ORAL_TABLET | Freq: Two times a day (BID) | ORAL | 0 refills | Status: DC

## 2021-12-14 NOTE — Progress Notes (Signed)
Chief Complaint  Patient presents with   Nail Problem    left foot nail pain.    HPI: 66 y.o. male PMHx diabetes mellitus last A1c 7.6 on 07/29/2021 and PSxHx smoker presenting today for left fourth toe pain.  Patient states that he experiences redness with swelling and pain to the left fourth toe.  He was last seen in the office on 12/02/2021 for routine foot care.  He says he continues to have pain and tenderness.  Presenting for further treatment and evaluation  Past Medical History:  Diagnosis Date   Arthritis    lower back   Asthma    Atrial fibrillation [facility])    Atrial flutter ([place])    s/p CTI ablation by Dr Rayann Heman   Brain aneurysm 2009   questionable. A follow up CTA in 2009 showed no evidence of   Chronic back pain    DDD/stenosis   Colon polyps    9 polyps removed 3/38/25   Complication of anesthesia 09/11/2012   slow to awaken after ablation   COPD (chronic obstructive pulmonary disease) ([place])    Diabetes mellitus    takes Metformin and Glimepiride daily   Emphysema    GERD (gastroesophageal reflux disease)    takes Omeprazole bid   Heart failure ([place])    History of shingles    HLD (hyperlipidemia)    takes Pravastatin daily   HTN (hypertension)    takes Prinizide daily   Obesity    OSA (obstructive sleep apnea)    not always using cpap   Overdose 2009   unintentional Flecanide overdose   Peripheral neuropathy    Short-term memory loss    Tobacco abuse     Past Surgical History:  Procedure Laterality Date   ANTERIOR CERVICAL DECOMP/DISCECTOMY FUSION  01/04/2012   Procedure: ANTERIOR CERVICAL DECOMPRESSION/DISCECTOMY FUSION 1 LEVEL/HARDWARE REMOVAL;  Surgeon: Ophelia Charter, MD;  Location: [facility];  Service: Neurosurgery;  Laterality: N/A;  explore cervical fusion Cervical six - seven  with removal of codman plate anterior cervical decompression with fusion interbody prothesis plating and bonegraft   APPENDECTOMY  2[place]73yr ago   [place]   PT DID NOT HAVE AN ATRIAL FIBRILLATION ABLATION IN 2014!  ATRIAL FLUTTER ABLATION ONLY   ATRIAL FLUTTER ABLATION  09/11/2012   CTI ablation by Dr ARayann Heman  BIOPSY  11/07/2019   Procedure: BIOPSY;  Surgeon: JMilus Banister MD;  [facility];  Service: Endoscopy;;   CARDIAC CATHETERIZATION  2008   no significant CAD   CARDIOVERSION  05/05/2011   Procedure: CARDIOVERSION;  Surgeon: BLelon Perla MD;  [place]  Service: Cardiovascular;  Laterality: N/A;   CARDIOVERSION Bilateral 07/26/2012   Procedure: CARDIOVERSION;  Surgeon: JMinus Breeding MD;  [facility];  Service: Cardiovascular;  Laterality: Bilateral;   CARPAL TUNNEL RELEASE  99/2000   bilateral   COLONOSCOPY WITH PROPOFOL N/A 12/13/2012   Procedure: COLONOSCOPY WITH PROPOFOL;  Surgeon: DMilus Banister MD;  [facility]: [facility];  Service: Endoscopy;  Laterality: N/A;   ELECTROPHYSIOLOGIC STUDY N/A 04/21/2015   Procedure: Atrial Fibrillation Ablation;  Surgeon: JThompson Grayer MD;  [place];  Service: Cardiovascular;  Laterality: N/A;   ESOPHAGOGASTRODUODENOSCOPY (EGD) WITH PROPOFOL N/A 11/07/2019   Procedure: ESOPHAGOGASTRODUODENOSCOPY (EGD) WITH PROPOFOL;  Surgeon: JMilus Banister MD;  [facility]: [facility];  Service: Endoscopy;  Laterality: N/A;   HERNIA REPAIR     KNEE ARTHROSCOPY WITH MEDIAL MENISECTOMY Left  04/23/2021   Procedure: LEFT KNEE ARTHROSCOPY WITH PARTIAL MEDIAL MENISCECTOMY;  Surgeon: [name], MD;  Location: [facility];  Service: Orthopedics;  Laterality: Left;   KNEE SURGERY  6-38yr ago   left   LEFT HEART CATH AND CORONARY ANGIOGRAPHY N/A 09/19/2016   Procedure: Left Heart Cath and Coronary Angiography;  Surgeon: SBelva Crome MD;  [facility];  Service: Cardiovascular;  Laterality: N/A;   Left inguinal hernia repair     as a child   NASAL SEPTOPLASTY W/ TURBINOPLASTY Bilateral 02/19/2014   Procedure: NASAL  SEPTOPLASTY WITH BILATERAL TURBINATE REDUCTION;  Surgeon: KJodi Marble MD;  [place]  Service: ENT;  Laterality: Bilateral;   NECK SURGERY  [place]ago   right shoulder surgery  [id number]   cyst removed   TEE WITHOUT CARDIOVERSION  05/05/2011   Procedure: TRANSESOPHAGEAL ECHOCARDIOGRAM (TEE);  Surgeon: BriLelon PerlaD;  [facility];  Service: Cardiovascular;  Laterality: N/A;   TEE WITHOUT CARDIOVERSION N/A 04/21/2015   Procedure: TRANSESOPHAGEAL ECHOCARDIOGRAM (TEE);  Surgeon: TraSueanne MargaritaD;  [facility];  Service: Cardiovascular;  Laterality: N/A;   THROAT SURGERY  [id number]   "thought " it was cancer but came back not   TONSILLECTOMY      Allergies  Allergen Reactions   Adhesive [Tape]     itching   Latex Itching    When tape is on the skin too long skin gets red & itching     Physical Exam: General: The patient is alert and oriented x3 in no acute distress.  Dermatology: Skin is warm, dry and supple bilateral lower extremities. Negative for open lesions or macerations.  Vascular: Palpable pedal pulses bilaterally. Capillary refill within normal limits.  There is some mild erythema and and edema noted to the left fourth toe however there is no open wound or drainage.  Neurological: Light touch and protective threshold diminished  Musculoskeletal Exam: No pedal deformities noted  Assessment: 1.  Diabetes mellitus with peripheral polyneuropathy 2.  Active smoker with peripheral vascular disease   Plan of Care:  1. Patient evaluated.  2.  Patient is on chronic pain management.  He takes oxycodone and gabapentin.  Continue as prescribed 3.  Due to the slight redness and erythema of the left fourth toe I did prescribe doxycycline 100 mg 2 times daily #20 4.  I do not see recent ABIs of the bilateral lower extremities.  The patient is having pain beginning from his legs all the way down to his feet.  Order placed for ABIs bilateral lower  extremity 5.  Return to clinic after vascular results      BrenEdrick KinsM [facility]  Dr. BrenEdrick KinsM    [address] [id number]            Office [phone]

## 2021-12-23 ENCOUNTER — Ambulatory Visit (HOSPITAL_COMMUNITY)
Admission: RE | Admit: 2021-12-23 | Discharge: 2021-12-23 | Disposition: A | Discharge status: Home / Self Care | Payer: Commercial Managed Care - HMO | Source: Ambulatory Visit | Attending: Podiatry | Admitting: Podiatry

## 2021-12-23 DIAGNOSIS — I739 Peripheral vascular disease, unspecified: Secondary | ICD-10-CM

## 2021-12-30 ENCOUNTER — Other Ambulatory Visit: Payer: Self-pay | Admitting: Podiatry

## 2021-12-30 ENCOUNTER — Telehealth: Payer: Self-pay | Admitting: *Deleted

## 2021-12-30 DIAGNOSIS — I739 Peripheral vascular disease, unspecified: Secondary | ICD-10-CM

## 2021-12-30 NOTE — Telephone Encounter (Signed)
Patient is requesting results of his vascular US study(08/10), please advise.

## 2021-12-30 NOTE — Telephone Encounter (Signed)
Spoke with patient. Reviewed results. Placed order for referral to [facility]. - Dr. Amalia Hailey

## 2022-01-03 ENCOUNTER — Ambulatory Visit (INDEPENDENT_AMBULATORY_CARE_PROVIDER_SITE_OTHER): Payer: Commercial Managed Care - HMO | Admitting: *Deleted

## 2022-01-03 DIAGNOSIS — I480 Paroxysmal atrial fibrillation: Secondary | ICD-10-CM | POA: Diagnosis not present

## 2022-01-03 DIAGNOSIS — I4892 Unspecified atrial flutter: Secondary | ICD-10-CM

## 2022-01-03 DIAGNOSIS — Z7901 Long term (current) use of anticoagulants: Secondary | ICD-10-CM

## 2022-01-03 LAB — POCT INR: INR: 3.1 — AB (ref 2.0–3.0)

## 2022-01-03 NOTE — Patient Instructions (Signed)
Description   Have some leafy green veggies today then start taking 1/2 tablet daily except for 1 tablet every Sunday, Tuesday, and Thursday. Recheck INR in 3 weeks. [phone]

## 2022-01-03 NOTE — Progress Notes (Unsigned)
[facility] [place]  ASSESSMENT / PLAN: [name] is a 66 y.o. male with atherosclerosis of native arteries of bilateral lower extremities. He does not seem to have symptomatic peripheral arterial disease on exam. His non-invasive studies are reassuring. I think his symptoms are from spinal stenosis.   Recommend the following which can slow the progression of atherosclerosis and reduce the risk of major adverse cardiac / limb events:  Complete cessation from all tobacco products. Blood glucose control with goal A1c < 7%. Blood pressure control with goal blood pressure < 140/90 mmHg. Lipid reduction therapy with goal LDL-C <100 mg/dL. Aspirin '81mg'$  PO QD.  Atorvastatin 40-'80mg'$  PO QD (or other "high intensity" statin therapy).  Follow up with me as needed.   CHIEF COMPLAINT: Bilateral leg pain  HISTORY OF PRESENT ILLNESS: [name] is a 66 y.o. male who presents to clinic for evaluation of bilateral leg pain.  He reports pain occurs at rest and with activity.  He notices it when standing for long period of time or walking for long period of time.  He does not get relief by simply stopping walking.  He does notice improvement in pain with positional changes, especially laying down.  He does not describe ischemic rest pain.  He does not describe ulcers about his feet.  Past Medical History:  Diagnosis Date   Arthritis    lower back   Asthma    Atrial fibrillation [facility])    Atrial flutter [facility])    s/p CTI ablation by Dr Rayann Heman   Brain aneurysm 2009   questionable. A follow up CTA in 2009 showed no evidence of   Chronic back pain    DDD/stenosis   Colon polyps    9 polyps removed 3/61/44   Complication of anesthesia 09/11/2012   slow to awaken after ablation   COPD (chronic obstructive pulmonary disease) ([place])    Diabetes mellitus    takes Metformin and Glimepiride daily   Emphysema    GERD (gastroesophageal reflux disease)    takes Omeprazole bid    Heart failure ([place])    History of shingles    HLD (hyperlipidemia)    takes Pravastatin daily   HTN (hypertension)    takes Prinizide daily   Obesity    OSA (obstructive sleep apnea)    not always using cpap   Overdose 2009   unintentional Flecanide overdose   Peripheral neuropathy    Short-term memory loss    Tobacco abuse     Past Surgical History:  Procedure Laterality Date   ANTERIOR CERVICAL DECOMP/DISCECTOMY FUSION  01/04/2012   Procedure: ANTERIOR CERVICAL DECOMPRESSION/DISCECTOMY FUSION 1 LEVEL/HARDWARE REMOVAL;  Surgeon: Ophelia Charter, MD;  Location: [facility];  Service: Neurosurgery;  Laterality: N/A;  explore cervical fusion Cervical six - seven  with removal of codman plate anterior cervical decompression with fusion interbody prothesis plating and bonegraft   [place]CTOM[place]  2-52yr ago   [place]   PT DID NOT HAVE AN ATRIAL FIBRILLATION ABLATION IN 2014!  ATRIAL FLUTTER ABLATION ONLY   ATRIAL FLUTTER ABLATION  09/11/2012   CTI ablation by Dr ARayann Heman  BIOPSY  11/07/2019   Procedure: BIOPSY;  Surgeon: JMilus Banister MD;  Location: [facility];  Service: Endoscopy;;   CARDIAC CATHETERIZATION  2008   no significant CAD   CARDIOVERSION  05/05/2011   Procedure: CARDIOVERSION;  Surgeon: BLelon Perla MD;  [place]  Service: Cardiovascular;  Laterality:  N/A;   CARDIOVERSION Bilateral 07/26/2012   Procedure: CARDIOVERSION;  Surgeon: Minus Breeding, MD;  Location: [place];  Service: Cardiovascular;  Laterality: Bilateral;   CARPAL TUNNEL RELEASE  99/2000   bilateral   COLONOSCOPY WITH PROPOFOL N/A 12/13/2012   Procedure: COLONOSCOPY WITH PROPOFOL;  Surgeon: Milus Banister, MD;  Location: [facility];  Service: Endoscopy;  Laterality: N/A;   ELECTROPHYSIOLOGIC STUDY N/A 04/21/2015   Procedure: Atrial Fibrillation Ablation;  Surgeon: Thompson Grayer, MD;  Location: [place] [facility];  Service: Cardiovascular;   Laterality: N/A;   ESOPHAGOGASTRODUODENOSCOPY (EGD) WITH PROPOFOL N/A 11/07/2019   Procedure: ESOPHAGOGASTRODUODENOSCOPY (EGD) WITH PROPOFOL;  Surgeon: Milus Banister, MD;  Location: [facility];  Service: Endoscopy;  Laterality: N/A;   HERNIA REPAIR     KNEE ARTHROSCOPY WITH MEDIAL MENISECTOMY Left 04/23/2021   Procedure: LEFT KNEE ARTHROSCOPY WITH PARTIAL MEDIAL MENISCECTOMY;  Surgeon: [name], MD;  Location: [place];  Service: Orthopedics;  Laterality: Left;   KNEE SURGERY  6-69yr ago   left   LEFT HEART CATH AND CORONARY ANGIOGRAPHY N/A 09/19/2016   Procedure: Left Heart Cath and Coronary Angiography;  Surgeon: SBelva Crome MD;  [place];  Service: Cardiovascular;  Laterality: N/A;   Left inguinal hernia repair     as a child   NASAL SEPTOPLASTY W/ TURBINOPLASTY Bilateral 02/19/2014   Procedure: NASAL SEPTOPLASTY WITH BILATERAL TURBINATE REDUCTION;  Surgeon: KJodi Marble MD;  [place]  Service: ENT;  Laterality: Bilateral;   NECK SURGERY  1120yrago   right shoulder surgery  [id number]   cyst removed   TEE WITHOUT CARDIOVERSION  05/05/2011   Procedure: TRANSESOPHAGEAL ECHOCARDIOGRAM (TEE);  Surgeon: BriLelon PerlaD;  [facility];  Service: Cardiovascular;  Laterality: N/A;   TEE WITHOUT CARDIOVERSION N/A 04/21/2015   Procedure: TRANSESOPHAGEAL ECHOCARDIOGRAM (TEE);  Surgeon: TraSueanne MargaritaD;  [facility];  Service: Cardiovascular;  Laterality: N/A;   THROAT SURGERY  [id number]   "thought " it was cancer but came back not   TONSILLECTOMY      Family History  Problem Relation Age of Onset   Diabetes Mother    Heart disease Father    Lung cancer Father    Diabetes Maternal Grandmother    Colon cancer Neg Hx    Anesthesia problems Neg Hx    Hypotension Neg Hx    Malignant hyperthermia Neg Hx    Pseudochol deficiency Neg Hx    Esophageal cancer Neg Hx    Pancreatic cancer Neg Hx    Stomach cancer Neg Hx     Social  History   Socioeconomic History   Marital status: Married    Spouse name: Not on file   Number of children: 2   Years of education: Not on file   Highest education level: Not on file  Occupational History   Occupation: [employer] Use   Smoking status: Every Day    Packs/day: 1.00    Years: 49.00    Total pack years: 49.00    Types: Cigarettes   Smokeless tobacco: Never   Tobacco comments:    Smoking a half a pack per day as of 10/29/21 hfb  Vaping Use   Vaping Use: Never used  Substance and Sexual Activity   Alcohol use: Not Currently    Alcohol/week: 0.0 standard drinks of alcohol    Comment: 12/05/17 pt stated he has drinked 1/2 drink in 3 years  Drug use: No   Sexual activity: Yes  Other Topics Concern   Not on file  Social History Narrative   Daily caffeine(Mountain Dew)       Lives in [place] with spouse.   Unemployed due to chronic back/ leg pain         Social Determinants of Health   Financial Resource Strain: Not on file  Food Insecurity: Not on file  Transportation Needs: Not on file  Physical Activity: Not on file  Stress: Not on file  Social Connections: Not on file  Intimate Partner Violence: Not on file    Allergies  Allergen Reactions   Adhesive [Tape]     itching   Latex Itching    When tape is on the skin too long skin gets red & itching    Current Outpatient Medications  Medication Sig Dispense Refill   albuterol (PROAIR HFA) 108 (90 Base) MCG/ACT inhaler Inhale 1-2 puffs into the lungs every 6 (six) hours as needed for wheezing or shortness of breath. 18 g 5   albuterol (PROVENTIL) (2.5 MG/3ML) 0.083% nebulizer solution Take 3 mLs (2.5 mg total) by nebulization every 6 (six) hours as needed for wheezing or shortness of breath. 75 mL 12   b complex vitamins tablet Take 1 tablet by mouth daily.      bisoprolol (ZEBETA) 10 MG tablet Take 10 mg by mouth daily.      colchicine 0.6 MG tablet Take 2 tablet po once. Then take 1  tablet po one hour later. Wait 2 days then begin taking 1 tablet po QD to prevent acute gout. 33 tablet 2   Continuous Blood Gluc Receiver (DEXCOM G6 RECEIVER) DEVI      Continuous Blood Gluc Sensor (DEXCOM G6 SENSOR) MISC USE AS DIRECTED 9 each 3   Continuous Blood Gluc Transmit (DEXCOM G6 TRANSMITTER) MISC USE AS DIRECTED 1 each 3   cyclobenzaprine (FLEXERIL) 10 MG tablet Take 10 mg by mouth 3 (three) times daily.     diltiazem (CARDIZEM CD) 120 MG 24 hr capsule Take 1 capsule (120 mg total) by mouth daily. 90 capsule 1   doxycycline (VIBRA-TABS) 100 MG tablet Take 1 tablet (100 mg total) by mouth 2 (two) times daily. 20 tablet 0   Dulaglutide (TRULICITY) 3 HT/3.4KA SOPN Inject 3 mg as directed once a week. 6 mL 3   fluticasone-salmeterol (ADVAIR) 100-50 MCG/ACT AEPB Inhale 1 puff into the lungs 2 (two) times daily. 60 each 5   gabapentin (NEURONTIN) 300 MG capsule Take 3 capsules bid for neuropathy 540 capsule 1   HUMALOG KWIKPEN 100 UNIT/ML KwikPen Inject 0-60 Units into the skin 3 (three) times daily with meals. 135 mL 3   Insulin Pen Needle (EASY COMFORT PEN NEEDLES) 31G X 5 MM MISC To use with as needed insulin dosing up to four times daily as needed for hyperglycemia 300 each 3   losartan (COZAAR) 50 MG tablet TAKE 1 TABLET BY MOUTH DAILY 90 tablet 3   metolazone (ZAROXOLYN) 2.5 MG tablet TAKE 1 TABLET BY MOUTH ON TUESDAY AND THURSDAY 30 MINUTES BEFORE TAKING TORSEMIDE OR AS DIRECTED 15 tablet 3   nitroGLYCERIN (NITROSTAT) 0.4 MG SL tablet Place 1 tablet (0.4 mg total) under the tongue every 5 (five) minutes x 3 doses as needed for chest pain. 25 tablet 3   omeprazole (PRILOSEC) 20 MG capsule Take 1 capsule (20 mg total) by mouth 2 (two) times daily. 60 capsule 11   oxyCODONE-acetaminophen (PERCOCET) 10-325 MG  tablet Take 1.5 tablets by mouth in the morning, at noon, in the evening, and at bedtime.     potassium chloride SA (KLOR-CON M) 20 MEQ tablet TAKE 1 TABLET BY MOUTH DAILY. TAKE AN  EXTRA TABLET ON TUESDAY AND THURSDAY 40 tablet 6   pravastatin (PRAVACHOL) 80 MG tablet TAKE 1 TABLET BY MOUTH DAILY AT BEDTIME 30 tablet 10   predniSONE (DELTASONE) 10 MG tablet 4 tabs for 3 days, then 3 tabs for 3 days, 2 tabs for 3 days, then 1 tab for 3 days, then stop 30 tablet 0   tamsulosin (FLOMAX) 0.4 MG CAPS capsule TAKE 1 CAPSULE BY MOUTH EACH NIGHT AT BEDTIME 30 capsule 11   torsemide (DEMADEX) 20 MG tablet Take 2 tablets in the morning and 1 tablet in the PM 270 tablet 3   triamcinolone cream (KENALOG) 0.1 % Apply 1 Application topically daily as needed. 453 g 2   umeclidinium bromide (INCRUSE ELLIPTA) 62.5 MCG/ACT AEPB Inhale 1 puff into the lungs daily. 30 each 5   VITAMIN A PO Take 2,400 mcg by mouth daily.      Vitamin D, Ergocalciferol, (DRISDOL) 1.25 MG (50000 UNIT) CAPS capsule TAKE 1 CAPSULE BY MOUTH EVERY 7 DAYS 12 capsule 3   warfarin (COUMADIN) 5 MG tablet TAKE 1 TO 1 AND 1/2 TABLETS BY MOUTH DAILY AS DIRECTED BY THE COUMADIN CLINIC 40 tablet 1   No current facility-administered medications for this visit.    PHYSICAL EXAM Vitals:   01/04/22 0840  BP: (!) 113/59  Pulse: 73  Resp: 20  Temp: 98.3 F (36.8 C)  SpO2: 95%  Weight: 245 lb (111.1 kg)  Height: '6\' 2"'$  (1.88 m)    Chronically ill gentleman in no acute distress Regular rate and rhythm Unlabored breathing No palpable pedal pulses  PERTINENT LABORATORY AND RADIOLOGIC DATA  Most recent CBC    Latest Ref Rng & Units 08/12/2021   10:58 AM 06/28/2021   10:22 AM 04/23/2021    8:00 AM  CBC  WBC 3.4 - 10.8 x10E3/uL 5.4  6.4  5.5   Hemoglobin 13.0 - 17.7 g/dL 11.0  11.8  11.0   Hematocrit 37.5 - 51.0 % 32.9  36.1  34.4   Platelets 150 - 450 x10E3/uL 125  162  133      Most recent CMP    Latest Ref Rng & Units 09/21/2021   11:53 AM 08/12/2021   10:58 AM 06/28/2021   10:22 AM  CMP  Glucose 70 - 99 mg/dL  295  160   BUN 8 - 27 mg/dL  18  22   Creatinine 0.76 - 1.27 mg/dL  1.26  1.32   Sodium 134 - 144  mmol/L  142  143   Potassium 3.5 - 5.2 mmol/L  3.5  3.2   Chloride 96 - 106 mmol/L  98  98   CO2 20 - 29 mmol/L  29  31   Calcium 8.6 - 10.2 mg/dL  8.9  9.9   Total Protein 6.0 - 8.5 g/dL 7.1  6.8  7.3   Total Bilirubin 0.0 - 1.2 mg/dL 0.4  0.4  0.5   Alkaline Phos 44 - 121 IU/L 141  146  145   AST 0 - 40 IU/L '27  23  21   '$ ALT 0 - 44 IU/L '16  12  13     '$ Renal function CrCl cannot be calculated (Patient's most recent lab result is older than the maximum 21 days allowed.).  Hemoglobin A1C  Date Value  11/30/2021 7.7 % (A)  04/18/2019 7.9    LDL Calculated  Date Value Ref Range Status  08/18/2017 13 0 - 99 mg/dL Final   Direct LDL  Date Value Ref Range Status  09/08/2011 99.9 mg/dL Final    Comment:    Optimal:  <100 mg/dLNear or Above Optimal:  100-129 mg/dLBorderline High:  130-159 mg/dLHigh:  160-189 mg/dLVery High:  >190 mg/dL     +-------+-----------+-----------+------------+------------+  ABI/TBIToday's ABIToday's TBIPrevious ABIPrevious TBI  +-------+-----------+-----------+------------+------------+  Right  [place]         0.67       [place]          1.08          +-------+-----------+-----------+------------+------------+  Left   [place]         0.61       1.26        0.77          +-------+-----------+-----------+------------+------------+   Yevonne Aline. Stanford Breed, MD [facility] Phone Number: [phone] 01/03/2022 5:16 PM  Total time spent on preparing this encounter including chart review, data review, collecting history, examining the patient, coordinating care for this new patient, 60 minutes.  Portions of this report may have been transcribed using voice recognition software.  Every effort has been made to ensure accuracy; however, inadvertent computerized transcription errors may still be present.

## 2022-01-04 ENCOUNTER — Encounter: Payer: Self-pay | Admitting: Vascular Surgery

## 2022-01-04 ENCOUNTER — Ambulatory Visit: Payer: Commercial Managed Care - HMO | Admitting: Vascular Surgery

## 2022-01-04 VITALS — BP 113/59 | HR 73 | Temp 98.3°F | Resp 20 | Ht 74.0 in | Wt 245.0 lb

## 2022-01-04 DIAGNOSIS — I739 Peripheral vascular disease, unspecified: Secondary | ICD-10-CM | POA: Diagnosis not present

## 2022-01-05 ENCOUNTER — Telehealth: Payer: Self-pay | Admitting: Cardiology

## 2022-01-05 MED ORDER — WARFARIN SODIUM 5 MG PO TABS: ORAL_TABLET | ORAL | 3 refills | Status: DC

## 2022-01-05 NOTE — Telephone Encounter (Signed)
Refill request for warfarin:  Last INR was 3.1 on 01/03/22 Next INR due on 01/24/22 LOV was 08/09/21  Janan Ridge PA  Refill approved.

## 2022-01-05 NOTE — Telephone Encounter (Signed)
*  STAT* If patient is at the pharmacy, call can be transferred to refill team.   1. Which medications need to be refilled? (please list name of each medication and dose if known) warfarin (COUMADIN) 5 MG tablet  2. Which pharmacy/location (including street and city if local pharmacy) is medication to be sent to? [place], [place]  3. Do they need a 30 day or 90 day supply? [place]

## 2022-01-12 ENCOUNTER — Ambulatory Visit: Payer: Commercial Managed Care - HMO | Admitting: Internal Medicine

## 2022-01-12 ENCOUNTER — Encounter: Payer: Self-pay | Admitting: Internal Medicine

## 2022-01-12 VITALS — BP 128/72 | HR 73 | Ht 74.0 in | Wt 245.4 lb

## 2022-01-12 DIAGNOSIS — Z794 Long term (current) use of insulin: Secondary | ICD-10-CM

## 2022-01-12 DIAGNOSIS — E1122 Type 2 diabetes mellitus with diabetic chronic kidney disease: Secondary | ICD-10-CM | POA: Diagnosis not present

## 2022-01-12 DIAGNOSIS — E785 Hyperlipidemia, unspecified: Secondary | ICD-10-CM

## 2022-01-12 DIAGNOSIS — N1831 Chronic kidney disease, stage 3a: Secondary | ICD-10-CM | POA: Diagnosis not present

## 2022-01-12 MED ORDER — BASAGLAR KWIKPEN 100 UNIT/ML ~~LOC~~ SOPN: 50.0000 [IU] | PEN_INJECTOR | Freq: Every day | SUBCUTANEOUS | 3 refills | Status: DC

## 2022-01-12 MED ORDER — GLUCAGON 3 MG/DOSE NA POWD: 3.0000 mg | Freq: Once | NASAL | 11 refills | Status: AC | PRN

## 2022-01-12 NOTE — Progress Notes (Signed)
Patient ID: [name], male   DOB: July 29, 1955, 66 y.o.   MRN: [id number]  HPI: [name] is a 66 y.o.-year-old male, returning for follow-up for DM2, dx in 2012, insulin-dependent since 2019, uncontrolled, with complications (CHF, Afib/flutter, PAD, CKD, PN). Pt. previously saw [name], last visit 5 months ago.  Reviewed HbA1c: Lab Results  Component Value Date   HGBA1C 7.7 (A) 11/30/2021   HGBA1C 7.6 (A) 07/29/2021   HGBA1C 8.1 (A) 05/26/2021   HGBA1C 7.6 (A) 11/09/2020   HGBA1C 7.6 (A) 08/20/2020   HGBA1C 8.1 (A) 06/17/2020   HGBA1C 8.7 (A) 04/13/2020   HGBA1C 8.4 (A) 11/21/2019   HGBA1C 8.1 (A) 10/08/2019   HGBA1C 8.4 (A) 08/22/2019   Pt is on a regimen of: - Trulicity 3 mg weekly - Humalog 0-60 units 1-5x a day, before meals - max amount 270 units/day (!) Per review of [name] notes, time-released insulins caused a rash in the past.  Patient does not remember this at today's visit. He tried [place] but this caused dehydration. Metformin dose is limited by abdominal pain. Trulicity dose is limited by nausea.  In the past, he has been missing insulin 2 to 3 days at that time per review of [name] notes.  Pt checks his sugars >4x a day with his Dexcom CGM:   Lowest sugar was 60s; he has hypoglycemia awareness at 110.  Highest sugar was 600.  Glucometer: ReliOn  Pt's meals are: - Breakfast: sausage biscuit - Lunch: banana sandwich, leftovers - Dinner: meat and veggies, seldom cake - Snacks: occas. - pasta, etc.  - + CKD, last BUN/creatinine:  Lab Results  Component Value Date   BUN 18 08/12/2021   BUN 22 06/28/2021   CREATININE 1.26 08/12/2021   CREATININE 1.32 (H) 06/28/2021  On diltiazem, Cozaar 50 mg daily.  -+ HL; last set of lipids: Lab Results  Component Value Date   CHOL 122 08/18/2017   HDL 29 (L) 08/18/2017   LDLCALC 13 08/18/2017   LDLDIRECT 99.9 09/08/2011   TRIG 400 (H) 08/18/2017   CHOLHDL 4.2 08/18/2017  On  pravastatin 80 mg daily.  - last eye exam was in 2022. Reportedly No Ryan. Coming up in 04/2021.  - + numbness and tingling in his feet. Also, significant pain.  Last foot exam 12/2021.  On Neurontin 300 mg 3 capsules twice daily. He sees pain management.  He also has a history of HTN, OSA, COPD, GERD, DDD, gout. He is still smoking.  ROS: + see HPI No increased urination, blurry vision, nausea, chest pain.  Past Medical History:  Diagnosis Date   Arthritis    lower back   Asthma    Atrial fibrillation [facility])    Atrial flutter [facility])    s/p CTI ablation by [name]   Brain aneurysm 2009   questionable. A follow up CTA in 2009 showed no evidence of   Chronic back pain    DDD/stenosis   Colon polyps    9 polyps removed 3/78/58   Complication of anesthesia 09/11/2012   slow to awaken after ablation   COPD (chronic obstructive pulmonary disease) ([place])    Diabetes mellitus    takes Metformin and Glimepiride daily   Emphysema    GERD (gastroesophageal reflux disease)    takes Omeprazole bid   Heart failure (HCC)    History of shingles    HLD (hyperlipidemia)    takes Pravastatin daily   HTN (hypertension)    takes  Prinizide daily   Obesity    OSA (obstructive sleep apnea)    not always using cpap   Overdose 2009   unintentional Flecanide overdose   Peripheral neuropathy    Short-term memory loss    Tobacco abuse    Past Surgical History:  Procedure Laterality Date   ANTERIOR CERVICAL DECOMP/DISCECTOMY FUSION  01/04/2012   Procedure: ANTERIOR CERVICAL DECOMPRESSION/DISCECTOMY FUSION 1 LEVEL/HARDWARE REMOVAL;  Surgeon: [name];  Location: [place] [facility];  Service: Neurosurgery;  Laterality: N/A;  explore cervical fusion Cervical six - seven  with removal of codman plate anterior cervical decompression with fusion interbody prothesis plating and bonegraft   APPENDECTOMY  2-74yr ago   AShaferN/A 09/11/2012   PT DID NOT HAVE AN ATRIAL  FIBRILLATION ABLATION IN 2014!  ATRIAL FLUTTER ABLATION ONLY   ATRIAL FLUTTER ABLATION  09/11/2012   CTI ablation by Ryan ARayann [name]  BIOPSY  11/07/2019   Procedure: BIOPSY;  Surgeon: [name];  [facility];  Service: Endoscopy;;   CARDIAC CATHETERIZATION  2008   no significant CAD   CARDIOVERSION  05/05/2011   Procedure: CARDIOVERSION;  Surgeon: [name];  [facility]  Service: Cardiovascular;  Laterality: N/A;   CARDIOVERSION Bilateral 07/26/2012   Procedure: CARDIOVERSION;  Surgeon: [name];  [facility];  Service: Cardiovascular;  Laterality: Bilateral;   CARPAL TUNNEL RELEASE  99/2000   bilateral   COLONOSCOPY WITH PROPOFOL N/A 12/13/2012   Procedure: COLONOSCOPY WITH PROPOFOL;  Surgeon: [name];  [facility];  Service: Endoscopy;  Laterality: N/A;   ELECTROPHYSIOLOGIC STUDY N/A 04/21/2015   Procedure: Atrial Fibrillation Ablation;  Surgeon: [name];  [facility];  Service: Cardiovascular;  Laterality: N/A;   ESOPHAGOGASTRODUODENOSCOPY (EGD) WITH PROPOFOL N/A 11/07/2019   Procedure: ESOPHAGOGASTRODUODENOSCOPY (EGD) WITH PROPOFOL;  Surgeon: [name];  [facility];  Service: Endoscopy;  Laterality: N/A;   HERNIA REPAIR     KNEE ARTHROSCOPY WITH MEDIAL MENISECTOMY Left 04/23/2021   Procedure: LEFT KNEE ARTHROSCOPY WITH PARTIAL MEDIAL MENISCECTOMY;  Surgeon: [name];  [place] [facility]  Service: [place]opedics;  Laterality: Left;   KNEE SURGERY  [id number]   left   LEFT HEART CATH AND CORONARY ANGIOGRAPHY N/A 09/19/2016   Procedure: Left Heart Cath and Coronary Angiography;  Surgeon: [name];  [facility];  Service: Cardiovascular;  Laterality: N/A;   Left inguinal hernia repair     as a child   NASAL SEPTOPLASTY W/ TURBINOPLASTY Bilateral 02/19/2014   Procedure: NASAL SEPTOPLASTY WITH BILATERAL TURBINATE REDUCTION;  Surgeon:  [name];  [facility]: [facility];  Laterality: Bilateral;   NECK SURGERY  [id number]   right shoulder surgery  [id number]   cyst removed   TEE WITHOUT CARDIOVERSION  05/05/2011   Procedure: TRANSESOPHAGEAL ECHOCARDIOGRAM (TEE);  Surgeon: BriaLelon Perla;  [facility];  Service: Cardiovascular;  Laterality: N/A;   TEE WITHOUT CARDIOVERSION N/A 04/21/2015   Procedure: TRANSESOPHAGEAL ECHOCARDIOGRAM (TEE);  Surgeon: TracSueanne Margarita;  [facility];  Service: Cardiovascular;  Laterality: N/A;   THROAT SURGERY  4-70yrs84yr   "thought " it was cancer but came back not   TONSILLECTOMY     Social History   Socioeconomic History   Marital status: Married    Spouse name: Not on file   Number of children: 2  Years of education: Not on file   Highest education level: Not on file  Occupational History   Occupation: Dealer   Tobacco Use   Smoking status: Every Day    Packs/day: 1.00    Years: 49.00    Total pack years: 49.00    Types: Cigarettes   Smokeless tobacco: Never   Tobacco comments:    Smoking a half a pack per day as of 10/29/21 hfb  Vaping Use   Vaping Use: Never used  Substance and Sexual Activity   Alcohol use: Not Currently    Alcohol/week: 0.0 standard drinks of alcohol    Comment: 12/05/17 pt stated he has drinked 1/2 drink in 3 years   Drug use: No   Sexual activity: Yes  Other Topics Concern   Not on file  Social History Narrative   Daily caffeine(Mountain Dew)       Lives in [place] with spouse.   Unemployed due to chronic back/ leg pain         Social Determinants of Health   Financial Resource Strain: Not on file  Food Insecurity: Not on file  Transportation Needs: Not on file  Physical Activity: Not on file  Stress: Not on file  Social Connections: Not on file  Intimate Partner Violence: Not on file   Current Outpatient Medications on File Prior to Visit  Medication Sig Dispense Refill   albuterol  (PROAIR HFA) 108 (90 Base) MCG/ACT inhaler Inhale 1-2 puffs into the lungs every 6 (six) hours as needed for wheezing or shortness of breath. 18 g 5   albuterol (PROVENTIL) (2.5 MG/3ML) 0.083% nebulizer solution Take 3 mLs (2.5 mg total) by nebulization every 6 (six) hours as needed for wheezing or shortness of breath. 75 mL 12   b complex vitamins tablet Take 1 tablet by mouth daily.      bisoprolol (ZEBETA) 10 MG tablet Take 10 mg by mouth daily.      colchicine 0.6 MG tablet Take 2 tablet po once. Then take 1 tablet po one hour later. Wait 2 days then begin taking 1 tablet po QD to prevent acute gout. 33 tablet 2   Continuous Blood Gluc Receiver (DEXCOM G6 RECEIVER) DEVI      Continuous Blood Gluc Sensor (DEXCOM G6 SENSOR) MISC USE AS DIRECTED 9 each 3   Continuous Blood Gluc Transmit (DEXCOM G6 TRANSMITTER) MISC USE AS DIRECTED 1 each 3   cyclobenzaprine (FLEXERIL) 10 MG tablet Take 10 mg by mouth 3 (three) times daily.     diltiazem (CARDIZEM CD) 120 MG 24 hr capsule Take 1 capsule (120 mg total) by mouth daily. 90 capsule 1   doxycycline (VIBRA-TABS) 100 MG tablet Take 1 tablet (100 mg total) by mouth 2 (two) times daily. 20 tablet 0   Dulaglutide (TRULICITY) 3 EY/8.1KG SOPN Inject 3 mg as directed once a week. 6 mL 3   fluticasone-salmeterol (ADVAIR) 100-50 MCG/ACT AEPB Inhale 1 puff into the lungs 2 (two) times daily. 60 each 5   gabapentin (NEURONTIN) 300 MG capsule Take 3 capsules bid for neuropathy 540 capsule 1   HUMALOG KWIKPEN 100 UNIT/ML KwikPen Inject 0-60 Units into the skin 3 (three) times daily with meals. 135 mL 3   Insulin [name] Needle (EASY COMFORT [name] NEEDLES) 31G X 5 MM MISC To use with as needed insulin dosing up to four times daily as needed for hyperglycemia 300 each 3   losartan (COZAAR) 50 MG tablet TAKE 1 TABLET BY MOUTH DAILY  90 tablet 3   metolazone (ZAROXOLYN) 2.5 MG tablet TAKE 1 TABLET BY MOUTH ON TUESDAY AND THURSDAY 30 MINUTES BEFORE TAKING TORSEMIDE OR AS DIRECTED  15 tablet 3   nitroGLYCERIN (NITROSTAT) 0.4 MG SL tablet Place 1 tablet (0.4 mg total) under the tongue every 5 (five) minutes x 3 doses as needed for chest pain. 25 tablet 3   omeprazole (PRILOSEC) 20 MG capsule Take 1 capsule (20 mg total) by mouth 2 (two) times daily. 60 capsule 11   oxyCODONE-acetaminophen (PERCOCET) 10-325 MG tablet Take 1.5 tablets by mouth in the morning, at noon, in the evening, and at bedtime.     potassium chloride SA (KLOR-CON M) 20 MEQ tablet TAKE 1 TABLET BY MOUTH DAILY. TAKE AN EXTRA TABLET ON TUESDAY AND THURSDAY 40 tablet 6   pravastatin (PRAVACHOL) 80 MG tablet TAKE 1 TABLET BY MOUTH DAILY AT BEDTIME 30 tablet 10   predniSONE (DELTASONE) 10 MG tablet 4 tabs for 3 days, then 3 tabs for 3 days, 2 tabs for 3 days, then 1 tab for 3 days, then stop 30 tablet 0   tamsulosin (FLOMAX) 0.4 MG CAPS capsule TAKE 1 CAPSULE BY MOUTH EACH NIGHT AT BEDTIME 30 capsule 11   torsemide (DEMADEX) 20 MG tablet Take 2 tablets in the morning and 1 tablet in the PM 270 tablet 3   triamcinolone cream (KENALOG) 0.1 % Apply 1 Application topically daily as needed. 453 g 2   umeclidinium bromide (INCRUSE ELLIPTA) 62.5 MCG/ACT AEPB Inhale 1 puff into the lungs daily. 30 each 5   VITAMIN A PO Take 2,400 mcg by mouth daily.      Vitamin D, Ergocalciferol, (DRISDOL) 1.25 MG (50000 UNIT) CAPS capsule TAKE 1 CAPSULE BY MOUTH EVERY 7 DAYS 12 capsule 3   warfarin (COUMADIN) 5 MG tablet Take 1/2 to 1 tablet daily or as directed 40 tablet 3   No current facility-administered medications on file prior to visit.   Allergies  Allergen Reactions   Adhesive [Tape]     itching   Latex Itching    When tape is on the skin too long skin gets red & itching   Family History  Problem Relation Age of Onset   Diabetes Mother    Heart disease Father    Lung cancer Father    Diabetes Maternal Grandmother    Colon cancer Neg Hx    Anesthesia problems Neg Hx    Hypotension Neg Hx    Malignant hyperthermia  Neg Hx    Pseudochol deficiency Neg Hx    Esophageal cancer Neg Hx    Pancreatic cancer Neg Hx    Stomach cancer Neg Hx    PE: BP 128/72 (BP Location: Left Arm, Patient Position: Sitting, Cuff Size: Normal)   Pulse 73   Ht '6\' 2"'$  (1.88 m)   Wt 245 lb 6.4 oz (111.3 kg)   SpO2 94%   BMI 31.51 kg/m  Wt Readings from Last 3 Encounters:  01/12/22 245 lb 6.4 oz (111.3 kg)  01/04/22 245 lb (111.1 kg)  11/30/21 240 lb 12.8 oz (109.2 kg)   Constitutional: overweight, in NAD, walks with a cane, disequilibrium evident Eyes: no exophthalmos ENT: moist mucous membranes, no thyromegaly, no cervical lymphadenopathy Cardiovascular: RRR, No MRG Respiratory: CTA B Musculoskeletal: no deformities Skin: moist, warm, + rash B shins - ?NLD; also cigarette burns on bilateral legs as he dropped his cigarette while sleeping Neurological: + Naitik and tremor with outstretched hands  ASSESSMENT: 1. DM2, insulin-dependent,  uncontrolled, with complications - CHF - A fib/flutter - PAD - CKD stage III - PN  2. HL  PLAN:  1. Patient with long-standing, uncontrolled diabetes, on injectable antidiabetic regimen with weekly GLP-1 receptor agonist and mealtime insulin, with poor control.  Latest HbA1c was 7.7%, higher, last month. CGM interpretation: -At today's visit, we reviewed his CGM downloads: It appears that 8% of values are in target range (goal >70%), while 92% are higher than 180 (goal <25%), and 0% are lower than 70 (goal <4%).  The calculated average blood sugar is 238.  The projected HbA1c for the next 3 months (GMI) is 9.0%. -Reviewing the CGM trends, sugars are very high, fluctuating almost entirely above the target range, mostly around 250 level.  He is not taking long-acting insulin.  Upon questioning, he is not sure why he is not taking this.  Per [name] note, he tried an analog in the past but this caused a rash.  He cannot remember this.  At today's visit, we discussed that the only  way to bring all of his blood sugars down is to start long-acting insulin.  He agrees.  I advised him to start [place] at 40 units at bedtime and then increase as needed.  I advised him how to titrate the dose. -He mentions that he is taking a variable dose of NovoLog, depending on the blood sugars before the meal and what he plans to eat for the particular meal.  However, his sugars are at goal and even higher than goal, around 180, he may not take any insulin.  I explained that this is usually conducive to very high blood sugars later.  I advised him to always take NovoLog, but he may need to vary the dose based on preprandial blood sugars and the size of the meal.  He agrees to do so.  I advised him to take NovoLog 15 minutes before every meal.  He is not trying to do so and sometimes, when the sugars are high, he takes it more in advance.  We can continue with this practice. -He is afraid that his CGM may not him accurate readings.  Upon questioning, he notices discrepancies between fingerstick blood sugars and interstitial sugars.  I advised him that there is a lack between these values, especially when he eats or try to correct the low blood sugar, the blood glucose increases first, while the interstitial fluid increase is 15 to 30 minutes later. -For now, we will continue the current dose of Trulicity, which she tolerates well. -He mentions that he has blurry vision and he is planning to his glasses soon.  I advised him to hold off until we can improve his blood sugars since I anticipate a change in his lens Rx. - I suggested to:  Patient Instructions  Please continue: - Trulicity 3 mg weekly  Change: - NovoLog 20-40 units 3x a day, before meals  Please start: - Basaglar 40 units at bedtime >> increase by 5 units every 3-4 days until sugars in am are <140 or you reach 70 units.  Please return in 3-4 months.  - check sugars at different times of the day - check 4x a day, rotating checks -  discussed about CBG targets for treatment: 80-130 mg/dL before meals and <180 mg/dL after meals; target HbA1c <7%. - given foot care handout  - he has severe peripheral neuropathy for which she sees podiatry and pain management.  He was advised by pain management to  start the new medication but he has not heard back from the insurance (PA was sent).  He will call his pain management provider. - given instructions for hypoglycemia management "15-15 rule"  - advised for yearly eye exams  - Return to clinic in 3-4 mo  2. HL - Reviewed latest lipid panel from 2019: LDL very low, triglycerides high, HDL low: Lab Results  Component Value Date   CHOL 122 08/18/2017   HDL 29 (L) 08/18/2017   LDLCALC 13 08/18/2017   LDLDIRECT 99.9 09/08/2011   TRIG 400 (H) 08/18/2017   CHOLHDL 4.2 08/18/2017  - Continues pravastatin 80 mg daily without side effects. - will need to check a lipid panel at next visit  - Total time spent for the visit: 40 min, in precharting, reviewing [name] last note, obtaining medical information from the chart and from the pt, reviewing his  previous labs, evaluations, and treatments, reviewing his symptoms, counseling him about his diabetes (please see the discussed topics above), and developing a plan to further treat it; he had a number of questions which I addressed.  Philemon Kingdom, MD PhD [facility]

## 2022-01-12 NOTE — Patient Instructions (Addendum)
Please continue: - Trulicity 3 mg weekly  Change: - NovoLog 20-40 units 3x a day, before meals  Please start: - Basaglar 40 units at bedtime >> increase by 5 units every 3-4 days until sugars in am are <140 or you reach 70 units.  Please return in 3-4 months.  PATIENT INSTRUCTIONS FOR TYPE 2 DIABETES:  **Please join MyChart!** - see attached instructions about how to join if you have not done so already.  DIET AND EXERCISE Diet and exercise is an important part of diabetic treatment.  We recommended aerobic exercise in the form of brisk walking (working between 40-60% of maximal aerobic capacity, similar to brisk walking) for 150 minutes per week (such as 30 minutes five days per week) along with 3 times per week performing 'resistance' training (using various gauge rubber tubes with handles) 5-10 exercises involving the major muscle groups (upper body, lower body and core) performing 10-15 repetitions (or near fatigue) each exercise. Start at half the above goal but build slowly to reach the above goals. If limited by weight, joint pain, or disability, we recommend daily walking in a swimming pool with water up to waist to reduce pressure from joints while allow for adequate exercise.    BLOOD GLUCOSES Monitoring your blood glucoses is important for continued management of your diabetes. Please check your blood glucoses 2-4 times a day: fasting, before meals and at bedtime (you can rotate these measurements - e.g. one day check before the 3 meals, the next day check before 2 of the meals and before bedtime, etc.).   HYPOGLYCEMIA (low blood sugar) Hypoglycemia is usually a reaction to not eating, exercising, or taking too much insulin/ other diabetes drugs.  Symptoms include tremors, sweating, hunger, confusion, headache, etc. Treat IMMEDIATELY with 15 grams of Carbs: 4 glucose tablets  cup regular juice/soda 2 tablespoons raisins 4 teaspoons sugar 1 tablespoon honey Recheck blood  glucose in 15 mins and repeat above if still symptomatic/blood glucose <100.  RECOMMENDATIONS TO REDUCE YOUR RISK OF DIABETIC COMPLICATIONS: * Take your prescribed MEDICATION(S) * Follow a DIABETIC diet: Complex carbs, fiber rich foods, (monounsaturated and polyunsaturated) fats * AVOID saturated/trans fats, high fat foods, >2,300 mg salt per day. * EXERCISE at least 5 times a week for 30 minutes or preferably daily.  * DO NOT SMOKE OR DRINK more than 1 drink a day. * Check your FEET every day. Do not wear tightfitting shoes. Contact us if you develop an ulcer * See your EYE doctor once a year or more if needed * Get a FLU shot once a year * Get a PNEUMONIA vaccine once before and once after age 1 years  GOALS:  * Your Hemoglobin A1c of <7%  * fasting sugars need to be <130 * after meals sugars need to be <180 (2h after you start eating) * Your Systolic BP should be 962 or lower  * Your Diastolic BP should be 80 or lower  * Your HDL (Good Cholesterol) should be 40 or higher  * Your LDL (Bad Cholesterol) should be 100 or lower. * Your Triglycerides should be 150 or lower  * Your Urine microalbumin (kidney function) should be <30 * Your Body Mass Index should be 25 or lower    Please consider the following ways to cut down carbs and fat and increase fiber and micronutrients in your diet: - substitute whole grain for white bread or pasta - substitute brown rice for white rice - substitute 90-calorie flat bread pieces for  slices of bread when possible - substitute sweet potatoes or yams for white potatoes - substitute humus for margarine - substitute tofu for cheese when possible - substitute almond or rice milk for regular milk (would not drink soy milk daily due to concern for soy estrogen influence on breast cancer risk) - substitute dark chocolate for other sweets when possible - substitute water - can add lemon or orange slices for taste - for diet sodas (artificial sweeteners  will trick your body that you can eat sweets without getting calories and will lead you to overeating and weight gain in the long run) - do not skip breakfast or other meals (this will slow down the metabolism and will result in more weight gain over time)  - can try smoothies made from fruit and almond/rice milk in am instead of regular breakfast - can also try old-fashioned (not instant) oatmeal made with almond/rice milk in am - order the dressing on the side when eating salad at a restaurant (pour less than half of the dressing on the salad) - eat as little meat as possible - can try juicing, but should not forget that juicing will get rid of the fiber, so would alternate with eating raw veg./fruits or drinking smoothies - use as little oil as possible, even when using olive oil - can dress a salad with a mix of balsamic vinegar and lemon juice, for e.g. - use agave nectar, stevia sugar, or regular sugar rather than artificial sweateners - steam or broil/roast veggies  - snack on veggies/fruit/nuts (unsalted, preferably) when possible, rather than processed foods - reduce or eliminate aspartame in diet (it is in diet sodas, chewing gum, etc) Read the labels!  Try to read Dr. Janene Harvey book: "Program for Reversing Diabetes" for other ideas for healthy eating.

## 2022-01-18 ENCOUNTER — Other Ambulatory Visit: Payer: Self-pay | Admitting: *Deleted

## 2022-01-18 ENCOUNTER — Telehealth: Payer: Self-pay

## 2022-01-18 DIAGNOSIS — F17210 Nicotine dependence, cigarettes, uncomplicated: Secondary | ICD-10-CM

## 2022-01-18 DIAGNOSIS — Z87891 Personal history of nicotine dependence: Secondary | ICD-10-CM

## 2022-01-18 DIAGNOSIS — Z122 Encounter for screening for malignant neoplasm of respiratory organs: Secondary | ICD-10-CM

## 2022-01-18 NOTE — Telephone Encounter (Signed)
Left VM and call back information for patient to call to schedule annual LDCT

## 2022-01-20 ENCOUNTER — Other Ambulatory Visit: Payer: Self-pay

## 2022-01-20 DIAGNOSIS — E119 Type 2 diabetes mellitus without complications: Secondary | ICD-10-CM

## 2022-01-20 MED ORDER — EASY COMFORT PEN NEEDLES 31G X 5 MM MISC: 3 refills | Status: DC

## 2022-01-21 ENCOUNTER — Telehealth: Payer: Self-pay | Admitting: Nurse Practitioner

## 2022-01-21 NOTE — Telephone Encounter (Signed)
Patient states his previous dermatologist office has closed and he is needing to see someone for some current skin problems. He is wondering if he needs to come here and be seen or if you can refer him to a dermatologist. Requesting call back.

## 2022-01-24 ENCOUNTER — Ambulatory Visit: Payer: Commercial Managed Care - HMO

## 2022-01-24 ENCOUNTER — Other Ambulatory Visit: Payer: Self-pay

## 2022-01-24 DIAGNOSIS — N1831 Chronic kidney disease, stage 3a: Secondary | ICD-10-CM

## 2022-01-24 MED ORDER — TRULICITY 3 MG/0.5ML ~~LOC~~ SOAJ: 3.0000 mg | SUBCUTANEOUS | 3 refills | Status: DC

## 2022-01-28 ENCOUNTER — Ambulatory Visit: Payer: Commercial Managed Care - HMO | Attending: Cardiology

## 2022-01-28 DIAGNOSIS — I480 Paroxysmal atrial fibrillation: Secondary | ICD-10-CM

## 2022-01-28 DIAGNOSIS — Z7901 Long term (current) use of anticoagulants: Secondary | ICD-10-CM

## 2022-01-28 LAB — POCT INR: INR: 4.1 — AB (ref 2.0–3.0)

## 2022-01-28 NOTE — Patient Instructions (Signed)
Description   Hold today's dose and then start taking 1/2 tablet daily except for 1 tablet every Sunday and Thursday.  Recheck INR in 2 weeks. [phone]

## 2022-01-30 NOTE — Progress Notes (Unsigned)
Established patient visit   Patient: [name]   DOB: December 19, 1955   66 y.o. Male  MRN: [id number] Visit Date: 01/31/2022   No chief complaint on file.  Subjective    HPI  Follow up  -skin condition.  -fungal rash on hands, fingers, and legs . -had been seeing dermatologist which recently closed.  -will need referral to new dermatology office.  -appears that kenalog 0.1 % cream which has been prescribed for him in the past.  -   Medications: Outpatient Medications Prior to Visit  Medication Sig   albuterol (PROAIR HFA) 108 (90 Base) MCG/ACT inhaler Inhale 1-2 puffs into the lungs every 6 (six) hours as needed for wheezing or shortness of breath.   albuterol (PROVENTIL) (2.5 MG/3ML) 0.083% nebulizer solution Take 3 mLs (2.5 mg total) by nebulization every 6 (six) hours as needed for wheezing or shortness of breath.   b complex vitamins tablet Take 1 tablet by mouth daily.    bisoprolol (ZEBETA) 10 MG tablet Take 10 mg by mouth daily.    colchicine 0.6 MG tablet Take 2 tablet po once. Then take 1 tablet po one hour later. Wait 2 days then begin taking 1 tablet po QD to prevent acute gout.   Continuous Blood Gluc Receiver (DEXCOM G6 RECEIVER) DEVI    Continuous Blood Gluc Sensor (DEXCOM G6 SENSOR) MISC USE AS DIRECTED   Continuous Blood Gluc Transmit (DEXCOM G6 TRANSMITTER) MISC USE AS DIRECTED   cyclobenzaprine (FLEXERIL) 10 MG tablet Take 10 mg by mouth 3 (three) times daily.   diltiazem (CARDIZEM CD) 120 MG 24 hr capsule Take 1 capsule (120 mg total) by mouth daily.   doxycycline (VIBRA-TABS) 100 MG tablet Take 1 tablet (100 mg total) by mouth 2 (two) times daily.   Dulaglutide (TRULICITY) 3 WU/9.8JX SOPN Inject 3 mg as directed once a week.   fluticasone-salmeterol (ADVAIR) 100-50 MCG/ACT AEPB Inhale 1 puff into the lungs 2 (two) times daily.   gabapentin (NEURONTIN) 300 MG capsule Take 3 capsules bid for neuropathy   Glucagon 3 MG/DOSE POWD Place 3 mg into the nose once as  needed for up to 1 dose.   HUMALOG KWIKPEN 100 UNIT/ML KwikPen Inject 0-60 Units into the skin 3 (three) times daily with meals.   Insulin Glargine (BASAGLAR KWIKPEN) 100 UNIT/ML Inject 50 Units into the skin at bedtime.   Insulin Pen Needle (EASY COMFORT PEN NEEDLES) 31G X 5 MM MISC To use with as needed insulin dosing up to four times daily   losartan (COZAAR) 50 MG tablet TAKE 1 TABLET BY MOUTH DAILY   metolazone (ZAROXOLYN) 2.5 MG tablet TAKE 1 TABLET BY MOUTH ON TUESDAY AND THURSDAY 30 MINUTES BEFORE TAKING TORSEMIDE OR AS DIRECTED   nitroGLYCERIN (NITROSTAT) 0.4 MG SL tablet Place 1 tablet (0.4 mg total) under the tongue every 5 (five) minutes x 3 doses as needed for chest pain.   omeprazole (PRILOSEC) 20 MG capsule Take 1 capsule (20 mg total) by mouth 2 (two) times daily.   oxyCODONE-acetaminophen (PERCOCET) 10-325 MG tablet Take 1.5 tablets by mouth in the morning, at noon, in the evening, and at bedtime.   potassium chloride SA (KLOR-CON M) 20 MEQ tablet TAKE 1 TABLET BY MOUTH DAILY. TAKE AN EXTRA TABLET ON TUESDAY AND THURSDAY   pravastatin (PRAVACHOL) 80 MG tablet TAKE 1 TABLET BY MOUTH DAILY AT BEDTIME   predniSONE (DELTASONE) 10 MG tablet 4 tabs for 3 days, then 3 tabs for 3 days, 2 tabs for 3  days, then 1 tab for 3 days, then stop   tamsulosin (FLOMAX) 0.4 MG CAPS capsule TAKE 1 CAPSULE BY MOUTH EACH NIGHT AT BEDTIME   torsemide (DEMADEX) 20 MG tablet Take 2 tablets in the morning and 1 tablet in the PM   triamcinolone cream (KENALOG) 0.1 % Apply 1 Application topically daily as needed.   umeclidinium bromide (INCRUSE ELLIPTA) 62.5 MCG/ACT AEPB Inhale 1 puff into the lungs daily.   VITAMIN A PO Take 2,400 mcg by mouth daily.    Vitamin D, Ergocalciferol, (DRISDOL) 1.25 MG (50000 UNIT) CAPS capsule TAKE 1 CAPSULE BY MOUTH EVERY 7 DAYS   warfarin (COUMADIN) 5 MG tablet Take 1/2 to 1 tablet daily or as directed   No facility-administered medications prior to visit.    Review of  Systems  {Labs (Optional):23779}   Objective    There were no vitals taken for this visit. BP Readings from Last 3 Encounters:  01/12/22 128/72  01/04/22 (!) 113/59  11/30/21 (!) 124/57    Wt Readings from Last 3 Encounters:  01/12/22 245 lb 6.4 oz (111.3 kg)  01/04/22 245 lb (111.1 kg)  11/30/21 240 lb 12.8 oz (109.2 kg)    Physical Exam  ***  No results found for any visits on 01/31/22.  Assessment & Plan     Problem List Items Addressed This Visit   None    No follow-ups on file.         Ronnell Freshwater, NP  [facility] [phone] (phone) [phone] (fax)  [facility]

## 2022-01-31 ENCOUNTER — Ambulatory Visit (INDEPENDENT_AMBULATORY_CARE_PROVIDER_SITE_OTHER): Payer: Commercial Managed Care - HMO | Admitting: Nurse Practitioner

## 2022-01-31 ENCOUNTER — Telehealth: Payer: Self-pay

## 2022-01-31 ENCOUNTER — Encounter: Payer: Self-pay | Admitting: Nurse Practitioner

## 2022-01-31 ENCOUNTER — Other Ambulatory Visit: Payer: Self-pay | Admitting: Nurse Practitioner

## 2022-01-31 VITALS — BP 132/80 | HR 68 | Ht 74.0 in | Wt 249.1 lb

## 2022-01-31 DIAGNOSIS — M064 Inflammatory polyarthropathy: Secondary | ICD-10-CM | POA: Diagnosis not present

## 2022-01-31 DIAGNOSIS — M10029 Idiopathic gout, unspecified elbow: Secondary | ICD-10-CM

## 2022-01-31 DIAGNOSIS — D485 Neoplasm of uncertain behavior of skin: Secondary | ICD-10-CM

## 2022-01-31 DIAGNOSIS — Z794 Long term (current) use of insulin: Secondary | ICD-10-CM

## 2022-01-31 DIAGNOSIS — E119 Type 2 diabetes mellitus without complications: Secondary | ICD-10-CM

## 2022-01-31 DIAGNOSIS — B372 Candidiasis of skin and nail: Secondary | ICD-10-CM | POA: Diagnosis not present

## 2022-01-31 DIAGNOSIS — N1831 Chronic kidney disease, stage 3a: Secondary | ICD-10-CM

## 2022-01-31 MED ORDER — KETOCONAZOLE 2 % EX CREA: 1.0000 | TOPICAL_CREAM | Freq: Every day | CUTANEOUS | 2 refills | Status: AC

## 2022-01-31 MED ORDER — NOVOLOG FLEXPEN 100 UNIT/ML ~~LOC~~ SOPN: 20.0000 [IU] | PEN_INJECTOR | Freq: Three times a day (TID) | SUBCUTANEOUS | 1 refills | Status: DC

## 2022-01-31 NOTE — Telephone Encounter (Signed)
Pt called back and pharmacy does not have Novolog rx on file. Per provider previous office note pt should have switched to  - NovoLog 20-40 units 3x a day, before meals  Please start: - Basaglar 40 units at bedtime >> increase by 5 units every 3-4 days until sugars in am are <140 or you reach 70 units. Rx sent to preferred pharmacy.

## 2022-01-31 NOTE — Telephone Encounter (Signed)
Pt confirms he is on Humalog and the pen he uses is typically room temp. He did advise the pharmacy contacted him and his rx is ready for pick up. He believes its the Novolog that was sent in. Pt will let us know.

## 2022-01-31 NOTE — Telephone Encounter (Signed)
Pt called to advise his Humalog insulin is burning when administered. Pt states its not all of the time but most of the time when he takes it, there is a burning sensation. Pt preferred pharmacy is [facility].

## 2022-01-31 NOTE — Telephone Encounter (Signed)
T, he has been on Humalog for a long time but at last visit we changed to NovoLog, I believe per insurance preference.  Can you please check with him which one he is taking and also if he keeps it in the fridge.  If it is cold, it tends to burn.  Please let me know.

## 2022-02-01 ENCOUNTER — Encounter: Payer: Self-pay | Admitting: Physician Assistant

## 2022-02-01 ENCOUNTER — Ambulatory Visit: Payer: Commercial Managed Care - HMO | Admitting: Physician Assistant

## 2022-02-03 ENCOUNTER — Other Ambulatory Visit: Payer: Commercial Managed Care - HMO

## 2022-02-04 ENCOUNTER — Telehealth: Payer: Self-pay | Admitting: Pulmonary Disease

## 2022-02-04 MED ORDER — AZITHROMYCIN 250 MG PO TABS: ORAL_TABLET | ORAL | 0 refills | Status: DC

## 2022-02-04 MED ORDER — PREDNISONE 10 MG PO TABS: ORAL_TABLET | ORAL | 0 refills | Status: DC

## 2022-02-04 NOTE — Telephone Encounter (Signed)
Called and spoke with pt who states for about a week now, he has been coughing up phlegm. States that it is now yellow to green in color and also has been wheezing. Pt denies any complaints of fever.  Pt and his wife both have the same symptoms. Pt is unable to come in for an appt due to both him and his wife being sick and is requesting to have meds sent to the pharmacy. Dr. Elsworth Soho, please advise.

## 2022-02-04 NOTE — Telephone Encounter (Signed)
Rigoberto Noel, MD  You 58 minutes ago (12:38 PM)    Z-Pak  Prednisone 10 mg tabs  Take 2 tabs daily with food x 5ds, then 1 tab daily with food x 5ds then STOP  COVID testing    Called and spoke with pt letting him know recs per Dr. Elsworth Soho and he verbalized understanding. Meds sent to preferred pharmacy and told pt to call office if covid test comes back positive. Nothing further needed.

## 2022-02-08 NOTE — Progress Notes (Unsigned)
Cardiology Office Note   Date:  02/08/2022   ID:  [name], DOB December 24, 1955, MRN [id number]  PCP:  Ronnell Freshwater, NP  Cardiologist:   Minus Breeding, MD   No chief complaint on file.      History of Present Illness: [name] is a 66 y.o. male who presents for who presents for follow up of atrial fibrillation and atrial flutter. He is s/p flutter ablation in 2014 and has actually done well from this standpoint. He has some chronic dyspnea.  He called last week and he was describing some breathing difficulties and chest discomfort.  These are episodic.  They come and go sometimes with exertion but without.  Is really not different than symptoms he has had in the past.  We have done an extensive work-up on him to include a work-up for amyloid which was negative with MRI.  He has had coronary artery calcium noted on CTs but he had a catheterization in 2018 with no obstructive coronary disease.  In fact his coronaries were said to be normal.   He had an echo last in 2021 with normal left ventricular function but left ventricular hypertrophy that was moderate.  He does have elevated pulmonary pressures.  He does have longstanding COPD.  He does not use oxygen.  He has sleep apnea but does not use CPAP.  He has aortic valve sclerosis and a slight murmur.  He has had carotid bruits but no obstructive disease and only mild plaque last year.  He gets around very slowly with his back pain and he is frustrated because he cannot get into pain management and he cannot use pain medications.  He still tries to do things and do a little bit of work.  He is not describing PND or orthopnea.  He is not describing new palpitations, presyncope or syncope although he occasionally has some rapid heart rates that is not at all similar to what he had prior to his ablation.  He said it is much better since then.   Past Medical History:  Diagnosis Date   Arthritis    lower back   Asthma    Atrial  fibrillation [facility])    Atrial flutter [facility])    s/p CTI ablation by Dr Rayann Heman   Brain aneurysm 2009   questionable. A follow up CTA in 2009 showed no evidence of   Chronic back pain    DDD/stenosis   Colon polyps    9 polyps removed 7/85/88   Complication of anesthesia 09/11/2012   slow to awaken after ablation   COPD (chronic obstructive pulmonary disease) ([place])    Diabetes mellitus    takes Metformin and Glimepiride daily   Emphysema    GERD (gastroesophageal reflux disease)    takes Omeprazole bid   Heart failure ([place])    History of shingles    HLD (hyperlipidemia)    takes Pravastatin daily   HTN (hypertension)    takes Prinizide daily   Obesity    OSA (obstructive sleep apnea)    not always using cpap   Overdose 2009   unintentional Flecanide overdose   Peripheral neuropathy    Short-term memory loss    Tobacco abuse     Past Surgical History:  Procedure Laterality Date   ANTERIOR CERVICAL DECOMP/DISCECTOMY FUSION  01/04/2012   Procedure: ANTERIOR CERVICAL DECOMPRESSION/DISCECTOMY FUSION 1 LEVEL/HARDWARE REMOVAL;  Surgeon: Ophelia Charter, MD;  Location: [place] [facility];  Service: Neurosurgery;  Laterality:  N/A;  explore cervical fusion Cervical six - seven  with removal of codman plate anterior cervical decompression with fusion interbody prothesis plating and bonegraft   APPENDECTOMY  2-78yr ago   AHughesN/A 09/11/2012   PT DID NOT HAVE AN ATRIAL FIBRILLATION ABLATION IN 2014!  ATRIAL FLUTTER ABLATION ONLY   ATRIAL FLUTTER ABLATION  09/11/2012   CTI ablation by Dr ARayann Heman  BIOPSY  11/07/2019   Procedure: BIOPSY;  Surgeon: JMilus Banister MD;  [facility];  Service: Endoscopy;;   CARDIAC CATHETERIZATION  2008   no significant CAD   CARDIOVERSION  05/05/2011   Procedure: CARDIOVERSION;  Surgeon: BLelon Perla MD;  [place]  Service: Cardiovascular;  Laterality: N/A;   CARDIOVERSION Bilateral 07/26/2012   Procedure:  CARDIOVERSION;  Surgeon: JMinus Breeding MD;  [facility];  Service: Cardiovascular;  Laterality: Bilateral;   CARPAL TUNNEL RELEASE  99/2000   bilateral   COLONOSCOPY WITH PROPOFOL N/A 12/13/2012   Procedure: COLONOSCOPY WITH PROPOFOL;  Surgeon: DMilus Banister MD;  [facility];  Service: Endoscopy;  Laterality: N/A;   ELECTROPHYSIOLOGIC STUDY N/A 04/21/2015   Procedure: Atrial Fibrillation Ablation;  Surgeon: JThompson Grayer MD;  [place] [place];  Service: Cardiovascular;  Laterality: N/A;   ESOPHAGOGASTRODUODENOSCOPY (EGD) WITH PROPOFOL N/A 11/07/2019   Procedure: ESOPHAGOGASTRODUODENOSCOPY (EGD) WITH PROPOFOL;  Surgeon: JMilus Banister MD;  [facility];  Service: Endoscopy;  Laterality: N/A;   HERNIA REPAIR     KNEE ARTHROSCOPY WITH MEDIAL MENISECTOMY Left 04/23/2021   Procedure: LEFT KNEE ARTHROSCOPY WITH PARTIAL MEDIAL MENISCECTOMY;  Surgeon: YMarybelle Killings MD;  [place]  Service: Orthopedics;  Laterality: Left;   KNEE SURGERY  [id number]   left   LEFT HEART CATH AND CORONARY ANGIOGRAPHY N/A 09/19/2016   Procedure: Left Heart Cath and Coronary Angiography;  Surgeon: SmBelva CromeMD;  [place] [facility];  Service: Cardiovascular;  Laterality: N/A;   Left inguinal hernia repair     as a child   NASAL SEPTOPLASTY W/ TURBINOPLASTY Bilateral 02/19/2014   Procedure: NASAL SEPTOPLASTY WITH BILATERAL TURBINATE REDUCTION;  Surgeon: KaJodi MarbleMD;  [place];  Laterality: Bilateral;   NECK SURGERY  1523yrgo   right shoulder surgery  [id number]   cyst removed   TEE WITHOUT CARDIOVERSION  05/05/2011   Procedure: TRANSESOPHAGEAL ECHOCARDIOGRAM (TEE);  Surgeon: BriaLelon Perla;  [facility];  Service: Cardiovascular;  Laterality: N/A;   TEE WITHOUT CARDIOVERSION N/A 04/21/2015   Procedure: TRANSESOPHAGEAL ECHOCARDIOGRAM (TEE);  Surgeon: TracSueanne Margarita;  [facility];  Service:  Cardiovascular;  Laterality: N/A;   THROAT SURGERY  4-46yrs61yr   "thought " it was cancer but came back not   TONSILLECTOMY       Current Outpatient Medications  Medication Sig Dispense Refill   albuterol (PROAIR HFA) 108 (90 Base) MCG/ACT inhaler Inhale 1-2 puffs into the lungs every 6 (six) hours as needed for wheezing or shortness of breath. 18 g 5   albuterol (PROVENTIL) (2.5 MG/3ML) 0.083% nebulizer solution Take 3 mLs (2.5 mg total) by nebulization every 6 (six) hours as needed for wheezing or shortness of breath. 75 mL 12   azithromycin (ZITHROMAX) 250 MG tablet Take two today and then one daily until finished. 6 tablet 0   b complex vitamins tablet Take 1 tablet by mouth daily.      bisoprolol (ZEBETA) 10 MG  tablet Take 10 mg by mouth daily.      colchicine 0.6 MG tablet TAKE 2 TABLETS BY MOUTH ONCE. THEN TAKE 1 TABLET 1 HOUR LATER. WAIT 2 DAYS, THEN BEGIN TAKING 1 TABLET DAILY TO PREVENT ACUTE GOUT. 33 tablet 2   Continuous Blood Gluc Receiver (DEXCOM G6 RECEIVER) DEVI      Continuous Blood Gluc Sensor (DEXCOM G6 SENSOR) MISC USE AS DIRECTED 9 each 3   Continuous Blood Gluc Transmit (DEXCOM G6 TRANSMITTER) MISC USE AS DIRECTED 1 each 3   cyclobenzaprine (FLEXERIL) 10 MG tablet Take 10 mg by mouth 3 (three) times daily.     diltiazem (CARDIZEM CD) 120 MG 24 hr capsule Take 1 capsule (120 mg total) by mouth daily. 90 capsule 1   Dulaglutide (TRULICITY) 3 BP/1.0CH SOPN Inject 3 mg as directed once a week. 6 mL 3   fluticasone-salmeterol (ADVAIR) 100-50 MCG/ACT AEPB Inhale 1 puff into the lungs 2 (two) times daily. 60 each 5   gabapentin (NEURONTIN) 300 MG capsule TAKE 3 CAPSULES BY MOUTH TWICE DAILY FOR NEUROPATHY 540 capsule 2   Glucagon 3 MG/DOSE POWD Place 3 mg into the nose once as needed for up to 1 dose. 1 each 11   insulin aspart (NOVOLOG FLEXPEN) 100 UNIT/ML FlexPen Inject 20-40 Units into the skin 3 (three) times daily with meals. 60 mL 1   Insulin Glargine (BASAGLAR KWIKPEN)  100 UNIT/ML Inject 50 Units into the skin at bedtime. 45 mL 3   Insulin Pen Needle (EASY COMFORT PEN NEEDLES) 31G X 5 MM MISC To use with as needed insulin dosing up to four times daily 400 each 3   ketoconazole (NIZORAL) 2 % cream Apply 1 Application topically daily. 60 g 2   losartan (COZAAR) 50 MG tablet TAKE 1 TABLET BY MOUTH DAILY 90 tablet 3   metolazone (ZAROXOLYN) 2.5 MG tablet TAKE 1 TABLET BY MOUTH ON TUESDAY AND THURSDAY 30 MINUTES BEFORE TAKING TORSEMIDE OR AS DIRECTED 15 tablet 3   nitroGLYCERIN (NITROSTAT) 0.4 MG SL tablet Place 1 tablet (0.4 mg total) under the tongue every 5 (five) minutes x 3 doses as needed for chest pain. 25 tablet 3   omeprazole (PRILOSEC) 20 MG capsule Take 1 capsule (20 mg total) by mouth 2 (two) times daily. 60 capsule 11   oxyCODONE-acetaminophen (PERCOCET) 10-325 MG tablet Take 1.5 tablets by mouth in the morning, at noon, in the evening, and at bedtime.     potassium chloride SA (KLOR-CON M) 20 MEQ tablet TAKE 1 TABLET BY MOUTH DAILY. TAKE AN EXTRA TABLET ON TUESDAY AND THURSDAY 40 tablet 6   pravastatin (PRAVACHOL) 80 MG tablet TAKE 1 TABLET BY MOUTH DAILY AT BEDTIME 30 tablet 10   predniSONE (DELTASONE) 10 MG tablet Take 2 tablets (20 mg total) by mouth daily with breakfast for 5 days, THEN 1 tablet (10 mg total) daily with breakfast for 5 days. 15 tablet 0   tamsulosin (FLOMAX) 0.4 MG CAPS capsule TAKE 1 CAPSULE BY MOUTH EACH NIGHT AT BEDTIME 30 capsule 11   torsemide (DEMADEX) 20 MG tablet Take 2 tablets in the morning and 1 tablet in the PM 270 tablet 3   triamcinolone cream (KENALOG) 0.1 % Apply 1 Application topically daily as needed. 453 g 2   umeclidinium bromide (INCRUSE ELLIPTA) 62.5 MCG/ACT AEPB Inhale 1 puff into the lungs daily. 30 each 5   VITAMIN A PO Take 2,400 mcg by mouth daily.      Vitamin D, Ergocalciferol, (DRISDOL) 1.25  MG (50000 UNIT) CAPS capsule TAKE 1 CAPSULE BY MOUTH EVERY 7 DAYS 12 capsule 3   warfarin (COUMADIN) 5 MG tablet  Take 1/2 to 1 tablet daily or as directed 40 tablet 3   No current facility-administered medications for this visit.    Allergies:   Adhesive [tape] and Latex    ROS:  Please see the history of present illness.   Otherwise, review of systems are positive for none.   All other systems are reviewed and negative.    PHYSICAL EXAM: VS:  There were no vitals taken for this visit. , BMI There is no height or weight on file to calculate BMI. GENERAL:  Well appearing NECK:  No jugular venous distention, waveform within normal limits, carotid upstroke brisk and symmetric, no bruits, no thyromegaly LUNGS: Mild diffuse expiratory wheezes CHEST:  Unremarkable HEART:  PMI not displaced or sustained,S1 and S2 within normal limits, no S3, no S4, no clicks, no rubs, no murmurs ABD:  Flat, positive bowel sounds normal in frequency in pitch, no bruits, no rebound, no guarding, no midline pulsatile mass, no hepatomegaly, no splenomegaly EXT:  2 plus pulses throughout, no edema, no cyanosis no clubbing  EKG:  EKG is   ordered today. Sinus rhythm, rate 78, axis within normal limits, intervals within normal limits, no acute ST-T wave changes, lateral T wave inversions unchanged from previous.  Recent Labs: 08/12/2021: BUN 18; Creatinine, Ser 1.26; Hemoglobin 11.0; Platelets 125; Potassium 3.5; Sodium 142; TSH 3.160 09/21/2021: ALT 16    Lipid Panel    Component Value Date/Time   CHOL 122 08/18/2017 0910   TRIG 400 (H) 08/18/2017 0910   HDL 29 (L) 08/18/2017 0910   CHOLHDL 4.2 08/18/2017 0910   CHOLHDL 5.3 08/27/2016 0235   VLDL 66 (H) 08/27/2016 0235   LDLCALC 13 08/18/2017 0910   LDLDIRECT 99.9 09/08/2011 0843      Wt Readings from Last 3 Encounters:  01/31/22 249 lb 1.9 oz (113 kg)  01/12/22 245 lb 6.4 oz (111.3 kg)  01/04/22 245 lb (111.1 kg)      Other studies Reviewed: Additional studies/ records that were reviewed today include:  Labs Review of the above records demonstrates:  Please  see elsewhere in the note.     ASSESSMENT AND PLAN:   Edema/SOB This is baseline.  He takes a as needed Zaroxolyn.  I will check a BNP level and a basic metabolic profile today because of the shortness of breath and his use of diuretics.   COPD (chronic obstructive pulmonary disease) (HCC) No change in therapy.  He was supposed to have pulmonary follow-up in the past but has canceled these appointments.   Essential hypertension [facility].  No change in therapy.   Sleep apnea He does not sleep with his CPAP.      Non-insulin dependent type 2 diabetes mellitus (HCC) A1c was 7.6 which is down from 8.6.  He is having this actively followed by Dr. Loanne Drilling Follow-up  PAF (paroxysmal atrial fibrillation) [facility]) He tolerates anticoagulation.  No change in therapy.    LVH:  He has moderate LVH with biatrial enlargement.  PYP scan was indeterminate.  However, MRI was negative for evidence of amyloid.  I will follow up with an echo in six months.   LEG PAIN:   He requests a pain management referral and I will try to facilitate this.   CHEST PAIN: He has had coronary calcium but no plaque.  He has a stable infrequent atypical  pain syndrome.  No further work-up is suggested.  He needs continued risk reduction.   Current medicines are reviewed at length with the patient today.  The patient does not have concerns regarding medicines.  The following changes have been made:   None  Labs/ tests ordered today include:   No orders of the defined types were placed in this encounter.     Disposition:   FU with APP in six months.   Signed, Minus Breeding, MD  02/08/2022 10:18 PM    [facility]

## 2022-02-09 ENCOUNTER — Other Ambulatory Visit (HOSPITAL_COMMUNITY): Payer: Self-pay

## 2022-02-09 MED ORDER — BASAGLAR KWIKPEN 100 UNIT/ML ~~LOC~~ SOPN: 70.0000 [IU] | PEN_INJECTOR | Freq: Every day | SUBCUTANEOUS | 1 refills | Status: DC

## 2022-02-09 MED ORDER — DEXCOM G7 RECEIVER DEVI: 0 refills | Status: DC

## 2022-02-09 MED ORDER — DEXCOM G7 SENSOR MISC: 3 refills | Status: DC

## 2022-02-09 NOTE — Addendum Note (Signed)
Addended by: Lauralyn Primes on: 02/09/2022 03:38 PM   Modules accepted: Orders

## 2022-02-09 NOTE — Telephone Encounter (Signed)
Pt called back states his blood sugars have been running high, when confirming his current regimen pt is taking 55 units of Basaglar. Pt was reminded of instructions for Basaglar 40 units at bedtime >> increase by 5 units every 3-4 days until sugars in am are <140 or you reach 70 units Pt requested a new rx as he is running out.  Pt also requested the Dexcom G7 as his G6 has been giving him readings of over 100 mg/dl difference. Rx sent to preferred pharmacy. Pt wanted to know if there was anything else he can do to get his blood sugar down. Pt is currently on Prednisone as well.

## 2022-02-09 NOTE — Telephone Encounter (Signed)
Pt advised he no longer wants to take the prednisone because of the high blood sugars. I tried advising pt he could increase his mealtime insulin while on the prednisone and he confirmed he would NOT be taking the prednisone.

## 2022-02-10 ENCOUNTER — Encounter: Payer: Self-pay | Admitting: Cardiology

## 2022-02-10 ENCOUNTER — Ambulatory Visit: Payer: Commercial Managed Care - HMO | Attending: Cardiology | Admitting: Cardiology

## 2022-02-10 ENCOUNTER — Ambulatory Visit (INDEPENDENT_AMBULATORY_CARE_PROVIDER_SITE_OTHER): Payer: Commercial Managed Care - HMO

## 2022-02-10 VITALS — BP 112/58 | HR 76 | Ht 74.0 in | Wt 245.2 lb

## 2022-02-10 DIAGNOSIS — I517 Cardiomegaly: Secondary | ICD-10-CM | POA: Diagnosis not present

## 2022-02-10 DIAGNOSIS — R0602 Shortness of breath: Secondary | ICD-10-CM | POA: Diagnosis not present

## 2022-02-10 DIAGNOSIS — M7989 Other specified soft tissue disorders: Secondary | ICD-10-CM | POA: Diagnosis not present

## 2022-02-10 DIAGNOSIS — Z5181 Encounter for therapeutic drug level monitoring: Secondary | ICD-10-CM

## 2022-02-10 DIAGNOSIS — I4892 Unspecified atrial flutter: Secondary | ICD-10-CM | POA: Diagnosis not present

## 2022-02-10 DIAGNOSIS — Z7901 Long term (current) use of anticoagulants: Secondary | ICD-10-CM

## 2022-02-10 DIAGNOSIS — I480 Paroxysmal atrial fibrillation: Secondary | ICD-10-CM

## 2022-02-10 LAB — POCT INR: INR: 2.5 (ref 2.0–3.0)

## 2022-02-10 NOTE — Patient Instructions (Signed)
Continue 1/2 tablet daily except for 1 tablet every Sunday and Thursday.  Recheck INR in 6 weeks. [phone]

## 2022-02-10 NOTE — Patient Instructions (Signed)
Medication Instructions:  NO CHANGES  *If you need a refill on your cardiac medications before your next appointment, please call your pharmacy*   Follow-Up: At [facility], you and your health needs are our priority.  As part of our continuing mission to provide you with exceptional heart care, we have created designated Provider Care Teams.  These Care Teams include your primary Cardiologist (physician) and Advanced Practice Providers (APPs -  Physician Assistants and Nurse Practitioners) who all work together to provide you with the care you need, when you need it.  We recommend signing up for the patient portal called "MyChart".  Sign up information is provided on this After Visit Summary.  MyChart is used to connect with patients for Virtual Visits (Telemedicine).  Patients are able to view lab/test results, encounter notes, upcoming appointments, etc.  Non-urgent messages can be sent to your provider as well.   To learn more about what you can do with MyChart, go to [email or web address].    Your next appointment:   12 month(s)  The format for your next appointment:   In Person  Provider:   Minus Breeding, MD

## 2022-02-15 ENCOUNTER — Telehealth: Payer: Self-pay

## 2022-02-15 ENCOUNTER — Other Ambulatory Visit: Payer: Self-pay | Admitting: Internal Medicine

## 2022-02-15 ENCOUNTER — Ambulatory Visit: Payer: Commercial Managed Care - HMO | Admitting: Orthopaedic Surgery

## 2022-02-15 DIAGNOSIS — N1831 Chronic kidney disease, stage 3a: Secondary | ICD-10-CM

## 2022-02-15 MED ORDER — NOVOLOG FLEXPEN 100 UNIT/ML ~~LOC~~ SOPN: 40.0000 [IU] | PEN_INJECTOR | Freq: Three times a day (TID) | SUBCUTANEOUS | 1 refills | Status: DC

## 2022-02-15 NOTE — Telephone Encounter (Signed)
We did get in touch on the 27th. If he has to take this much NovoLog, then definitely we need to increase his basal insulin more.  For now, I sent the prescription for up to 60 units of NovoLog 3 times a day, but I would not increase it more without increasing the basal insulin.  Please let us know how he is doing.

## 2022-02-15 NOTE — Telephone Encounter (Signed)
Patient states that he is taking 60 units of Novolog  3-5 times daily.  Would like for the script to be increased and sent to pharmacy. Patient was very upset because he states that he had been calling since Monday of last week.

## 2022-02-16 NOTE — Telephone Encounter (Signed)
Left detailed vm °

## 2022-02-17 ENCOUNTER — Encounter: Payer: Self-pay | Admitting: Nurse Practitioner

## 2022-02-17 ENCOUNTER — Telehealth: Payer: Self-pay | Admitting: Cardiology

## 2022-02-17 ENCOUNTER — Ambulatory Visit (INDEPENDENT_AMBULATORY_CARE_PROVIDER_SITE_OTHER): Payer: Commercial Managed Care - HMO | Admitting: Nurse Practitioner

## 2022-02-17 ENCOUNTER — Ambulatory Visit: Payer: Commercial Managed Care - HMO | Admitting: Orthopaedic Surgery

## 2022-02-17 VITALS — BP 131/70 | HR 75 | Ht 74.0 in | Wt 249.0 lb

## 2022-02-17 DIAGNOSIS — L03031 Cellulitis of right toe: Secondary | ICD-10-CM | POA: Diagnosis not present

## 2022-02-17 MED ORDER — SULFAMETHOXAZOLE-TRIMETHOPRIM 800-160 MG PO TABS: 1.0000 | ORAL_TABLET | Freq: Two times a day (BID) | ORAL | 0 refills | Status: DC

## 2022-02-17 MED ORDER — MUPIROCIN 2 % EX OINT: TOPICAL_OINTMENT | CUTANEOUS | 1 refills | Status: DC

## 2022-02-17 NOTE — Telephone Encounter (Signed)
Spoke with the pt and he says which is different from his original note that he ha already taken several doses of the Azithromycin and is stopping it today... he says he has another appt today and thinks they are going to put him on another antibiotic... he says he has neuropathy and has no feeling in his toes... he had something "growing" out of his toe and while sleeping last week his dog was "chewing off his toe".... his wife happened to catch the dog and made him stop... he has had an open infected wound since.   I asked him to let us know after his appt today if they add any further antibiotic meds as all of this could affect his INR and needs to be seen in the anticoag clinic.   I will forward to the nurses to see if they can reach out to the pt later today after he rerturns to determine when next appt needed based on new information.

## 2022-02-17 NOTE — Telephone Encounter (Signed)
Pt c/o medication issue:  1. Name of Medication: Azithromycin 250 mg   2. How are you currently taking this medication (dosage and times per day)? Not taking yet  3. Are you having a reaction (difficulty breathing--STAT)?   4. What is your medication issue? Was given this antibiotic, but patient wants to check to see that it will be okay to take with his current medication before he takes. Please advise.

## 2022-02-17 NOTE — Telephone Encounter (Signed)
There is an interaction with patients colchicine. He should only take 1/2 tablet every other day while on Azithromycin and for 14 days after he finishes. I can alos affect his warfarin. Should move his coumadin to 1 week from now.

## 2022-02-17 NOTE — Progress Notes (Signed)
Established patient visit   Patient: [name]   DOB: March 05, 1956   66 y.o. Male  MRN: [id number] Visit Date: 02/17/2022  Chief Complaint  Patient presents with   Toe Injury   Subjective    HPI  Patient states that his dog was nibbling on his big toe. Took a chunk ok skin off just below the toenail bed.  -skin is swollen and oozing some purulent fluid.  This started on Friday, 02/11/2022 -he does have neuropathy in his feet and lower leg. There is no pain associated with the wound     Medications: Outpatient Medications Prior to Visit  Medication Sig   albuterol (PROAIR HFA) 108 (90 Base) MCG/ACT inhaler Inhale 1-2 puffs into the lungs every 6 (six) hours as needed for wheezing or shortness of breath.   albuterol (PROVENTIL) (2.5 MG/3ML) 0.083% nebulizer solution Take 3 mLs (2.5 mg total) by nebulization every 6 (six) hours as needed for wheezing or shortness of breath.   b complex vitamins tablet Take 1 tablet by mouth daily.    bisoprolol (ZEBETA) 10 MG tablet Take 10 mg by mouth daily.    colchicine 0.6 MG tablet TAKE 2 TABLETS BY MOUTH ONCE. THEN TAKE 1 TABLET 1 HOUR LATER. WAIT 2 DAYS, THEN BEGIN TAKING 1 TABLET DAILY TO PREVENT ACUTE GOUT.   Continuous Blood Gluc Receiver (DEXCOM G6 RECEIVER) DEVI    Continuous Blood Gluc Receiver (DEXCOM G7 RECEIVER) DEVI Use as instructed to check blood sugar   Continuous Blood Gluc Sensor (DEXCOM G6 SENSOR) MISC USE AS DIRECTED   Continuous Blood Gluc Sensor (DEXCOM G7 SENSOR) MISC Use as instructed to check blood sugar   Continuous Blood Gluc Transmit (DEXCOM G6 TRANSMITTER) MISC USE AS DIRECTED   cyclobenzaprine (FLEXERIL) 10 MG tablet Take 10 mg by mouth 3 (three) times daily. (Patient not taking: Reported on 03/01/2022)   diltiazem (CARDIZEM CD) 120 MG 24 hr capsule Take 1 capsule (120 mg total) by mouth daily.   Dulaglutide (TRULICITY) 3 RS/8.5IO SOPN Inject 3 mg as directed once a week.   fluticasone-salmeterol (ADVAIR) 100-50  MCG/ACT AEPB Inhale 1 puff into the lungs 2 (two) times daily.   gabapentin (NEURONTIN) 300 MG capsule TAKE 3 CAPSULES BY MOUTH TWICE DAILY FOR NEUROPATHY   Glucagon 3 MG/DOSE POWD Place 3 mg into the nose once as needed for up to 1 dose.   insulin aspart (NOVOLOG FLEXPEN) 100 UNIT/ML FlexPen Inject 40-60 Units into the skin 3 (three) times daily with meals.   Insulin Glargine (BASAGLAR KWIKPEN) 100 UNIT/ML Inject 70 Units into the skin daily.   Insulin Pen Needle (EASY COMFORT PEN NEEDLES) 31G X 5 MM MISC To use with as needed insulin dosing up to four times daily   ketoconazole (NIZORAL) 2 % cream Apply 1 Application topically daily.   losartan (COZAAR) 50 MG tablet TAKE 1 TABLET BY MOUTH DAILY   metolazone (ZAROXOLYN) 2.5 MG tablet TAKE 1 TABLET BY MOUTH ON TUESDAY AND THURSDAY 30 MINUTES BEFORE TAKING TORSEMIDE OR AS DIRECTED   nitroGLYCERIN (NITROSTAT) 0.4 MG SL tablet Place 1 tablet (0.4 mg total) under the tongue every 5 (five) minutes x 3 doses as needed for chest pain.   omeprazole (PRILOSEC) 20 MG capsule Take 1 capsule (20 mg total) by mouth 2 (two) times daily.   oxyCODONE-acetaminophen (PERCOCET) 10-325 MG tablet Take 1.5 tablets by mouth in the morning, at noon, in the evening, and at bedtime.   potassium chloride SA (KLOR-CON M) 20 MEQ tablet  TAKE 1 TABLET BY MOUTH DAILY. TAKE AN EXTRA TABLET ON TUESDAY AND THURSDAY   pravastatin (PRAVACHOL) 80 MG tablet TAKE 1 TABLET BY MOUTH DAILY AT BEDTIME   tamsulosin (FLOMAX) 0.4 MG CAPS capsule TAKE 1 CAPSULE BY MOUTH EACH NIGHT AT BEDTIME   torsemide (DEMADEX) 20 MG tablet Take 2 tablets in the morning and 1 tablet in the PM   triamcinolone cream (KENALOG) 0.1 % Apply 1 Application topically daily as needed.   umeclidinium bromide (INCRUSE ELLIPTA) 62.5 MCG/ACT AEPB Inhale 1 puff into the lungs daily.   VITAMIN A PO Take 2,400 mcg by mouth daily.    Vitamin D, Ergocalciferol, (DRISDOL) 1.25 MG (50000 UNIT) CAPS capsule TAKE 1 CAPSULE BY  MOUTH EVERY 7 DAYS   warfarin (COUMADIN) 5 MG tablet Take 1/2 to 1 tablet daily or as directed   No facility-administered medications prior to visit.    Review of Systems  Constitutional:  Negative for activity change, chills, fatigue and fever.  HENT:  Negative for congestion, postnasal drip, rhinorrhea, sinus pressure, sinus pain, sneezing and sore throat.   Eyes: Negative.   Respiratory:  Negative for cough, shortness of breath and wheezing.   Cardiovascular:  Negative for chest pain and palpitations.  Gastrointestinal:  Negative for constipation, diarrhea, nausea and vomiting.  Endocrine: Negative for cold intolerance, heat intolerance, polydipsia and polyuria.  Genitourinary:  Negative for dysuria, frequency and urgency.  Musculoskeletal:  Positive for arthralgias and joint swelling. Negative for back pain and myalgias.  Skin:  Positive for wound. Negative for rash.       Wound present on right great toe  which occurred last Friday. Has been oozing some purulent fluid. Not tender   Allergic/Immunologic: Negative for environmental allergies.  Neurological:  Positive for weakness. Negative for dizziness and headaches.  Psychiatric/Behavioral:  The patient is nervous/anxious.      Objective     Today's Vitals   02/17/22 1408  BP: 131/70  Pulse: 75  SpO2: 96%  Weight: 249 lb (112.9 kg)  Height: '6\' 2"'$  (1.88 m)   Body mass index is 31.97 kg/m.   Physical Exam Vitals and nursing note reviewed.  Constitutional:      Appearance: Normal appearance. He is well-developed.  HENT:     Head: Normocephalic.  Eyes:     Pupils: Pupils are equal, round, and reactive to light.  Cardiovascular:     Rate and Rhythm: Normal rate and regular rhythm.     Pulses: Normal pulses.     Heart sounds: Normal heart sounds.  Pulmonary:     Effort: Pulmonary effort is normal.     Breath sounds: Normal breath sounds.  Abdominal:     Palpations: Abdomen is soft.  Musculoskeletal:         General: Normal range of motion.     Cervical back: Normal range of motion and neck supple.  Lymphadenopathy:     Cervical: No cervical adenopathy.  Skin:    General: Skin is warm and dry.     Capillary Refill: Capillary refill takes less than 2 seconds.     Comments: There is small open lesion at the base of the great toenail bed. Swelling and tenderness present. Oozing some serosanguinous fluid. Second lesion on the plantar aspect of the right great toe. Red, warm, and draining serosanguinous fluid.   Neurological:     General: No focal deficit present.     Mental Status: He is alert and oriented to person, place, and time.  Psychiatric:        Mood and Affect: Mood normal.        Behavior: Behavior normal.        Thought Content: Thought content normal.        Judgment: Judgment normal.       Assessment & Plan     1. Cellulitis of right toe Due to dog bite along the nailbed of the right great toe and along the dorsal surface of the toe. Superficial infection present. Apply bactroban ointment to effected areas twice daily. Advised he soak foot in warm water and epsom salt for 20 minutes two times daily. Keep wounds clean and dry. Reassess at next visit in two  weeks.  - mupirocin ointment (BACTROBAN) 2 %; Ply small amount to effected area twice daily for 10 days.  Dispense: 22 g; Refill: 1   Return for prn worsening or persistent symptoms.        Ronnell Freshwater, NP  [facility] [phone] (phone) [phone] (fax)  [place]

## 2022-02-17 NOTE — Telephone Encounter (Signed)
I spoke to the patient who was started on an Antibiotic today, but does not no the name of it.  He is picking it up at pharmacy and will call us in the morning with it.  We may arrange a f/u INR check next week pending antibiotic.

## 2022-02-18 NOTE — Telephone Encounter (Signed)
Started Bactrim today x 10 days. Per Karren Cobble, PharmD pt should have INR checked within 1 week and Warfarin dose should be reduced.  Instructed pt to only take Warfarin 1/2 tablet daily until appt on 02/23/22 and increase green intake. Pt verbalized understanding.

## 2022-02-22 ENCOUNTER — Ambulatory Visit (INDEPENDENT_AMBULATORY_CARE_PROVIDER_SITE_OTHER): Payer: Commercial Managed Care - HMO | Admitting: Orthopaedic Surgery

## 2022-02-22 ENCOUNTER — Encounter: Payer: Self-pay | Admitting: Orthopaedic Surgery

## 2022-02-22 ENCOUNTER — Ambulatory Visit: Payer: Self-pay

## 2022-02-22 ENCOUNTER — Ambulatory Visit (INDEPENDENT_AMBULATORY_CARE_PROVIDER_SITE_OTHER): Payer: Commercial Managed Care - HMO | Admitting: Podiatry

## 2022-02-22 VITALS — BP 119/58 | HR 82 | Ht 74.0 in | Wt 249.0 lb

## 2022-02-22 DIAGNOSIS — E0843 Diabetes mellitus due to underlying condition with diabetic autonomic (poly)neuropathy: Secondary | ICD-10-CM

## 2022-02-22 DIAGNOSIS — M170 Bilateral primary osteoarthritis of knee: Secondary | ICD-10-CM | POA: Diagnosis not present

## 2022-02-22 DIAGNOSIS — L97512 Non-pressure chronic ulcer of other part of right foot with fat layer exposed: Secondary | ICD-10-CM | POA: Diagnosis not present

## 2022-02-22 DIAGNOSIS — M1712 Unilateral primary osteoarthritis, left knee: Secondary | ICD-10-CM

## 2022-02-22 MED ORDER — BUPIVACAINE HCL 0.5 % IJ SOLN
3.0000 mL | INTRAMUSCULAR | Status: AC | PRN
  Administered 2022-02-22: 3 mL via INTRA_ARTICULAR

## 2022-02-22 MED ORDER — LIDOCAINE HCL 1 % IJ SOLN
0.5000 mL | INTRAMUSCULAR | Status: AC | PRN
  Administered 2022-02-22: .5 mL

## 2022-02-22 MED ORDER — METHYLPREDNISOLONE ACETATE 40 MG/ML IJ SUSP
40.0000 mg | INTRAMUSCULAR | Status: AC | PRN
  Administered 2022-02-22: 40 mg via INTRA_ARTICULAR

## 2022-02-22 NOTE — Progress Notes (Signed)
Office Visit Note   Patient: [name]           Date of Birth: 13-Apr-1956           MRN: [id number] Visit Date: 02/22/2022              Requested by: Ronnell Freshwater, NP [place],  [place] [id number] PCP: Ronnell Freshwater, NP   Assessment & Plan: Visit Diagnoses:  1. Unilateral primary osteoarthritis, left knee     Plan: Knee injection performed with some relief we will follow-up as needed.  Follow-Up Instructions: No follow-ups on file.   Orders:  Orders Placed This Encounter  Procedures   XR Knee 1-2 Views Left   No orders of the defined types were placed in this encounter.     Procedures: Large Joint Inj: L knee on 02/22/2022 3:10 PM Indications: joint swelling and pain Details: 22 G 1.5 in needle, anteromedial approach  Arthrogram: No  Medications: 0.5 mL lidocaine 1 %; 3 mL bupivacaine 0.5 %; 40 mg methylPREDNISolone acetate 40 MG/ML Outcome: tolerated well, no immediate complications Procedure, treatment alternatives, risks and benefits explained, specific risks discussed. Consent was given by the patient. Immediately prior to procedure a time out was called to verify the correct patient, procedure, equipment, support staff and site/side marked as required. Patient was prepped and draped in the usual sterile fashion.       Clinical Data: No additional findings.   Subjective: Chief Complaint  Patient presents with   Left Knee - Pain    HPI patient with recurrent left knee pain synovitis he has osteoarthritis but due to his cardiac condition is not a candidate for total knee arthroplasty per his cardiologist.  He had partial meniscectomy.  There was grade 4 cartilage wear patellofemoral joint and also medial compartment.  Partial meniscectomy was performed.  His knee has been swelling difficulty walking despite using a cane.  Review of Systems all other systems updated unchanged.   Objective: Vital Signs: BP (!) 119/58    Pulse 82   Ht '6\' 2"'$  (1.88 m)   Wt 249 lb (112.9 kg)   BMI 31.97 kg/m   Physical Exam Constitutional:      Appearance: He is well-developed.  HENT:     Head: Normocephalic and atraumatic.     Right Ear: External ear normal.     Left Ear: External ear normal.  Eyes:     Pupils: Pupils are equal, round, and reactive to light.  Neck:     Thyroid: No thyromegaly.     Trachea: No tracheal deviation.  Cardiovascular:     Rate and Rhythm: Normal rate.  Pulmonary:     Effort: Pulmonary effort is normal.     Breath sounds: No wheezing.  Abdominal:     General: Bowel sounds are normal.     Palpations: Abdomen is soft.  Musculoskeletal:     Cervical back: Neck supple.  Skin:    General: Skin is warm and dry.     Capillary Refill: Capillary refill takes less than 2 seconds.  Neurological:     Mental Status: He is alert and oriented to person, place, and time.  Psychiatric:        Behavior: Behavior normal.        Thought Content: Thought content normal.        Judgment: Judgment normal.     Ortho Exam moderate left knee Baker's cyst medial joint line tenderness.  2+ knee effusion crepitus with knee range of motion negative logroll the hips negative straight leg raising 90 degrees distal pulses are intact bilateral pretibial venous discoloration without current active ulceration.  Specialty Comments:  No specialty comments available.  Imaging: No results found.   PMFS History: Patient Active Problem List   Diagnosis Date Noted   Neoplasm of uncertain behavior of skin of lower leg 01/31/2022   Cutaneous candidiasis 01/31/2022   Spinal stenosis, lumbar region, with neurogenic claudication 10/14/2021   S/P cervical spinal fusion 10/14/2021   Chronic pain of left knee 10/14/2021   Chronic painful diabetic neuropathy ([place]) 10/14/2021   Abdominal discomfort 09/26/2021   Hepatomegaly 09/26/2021   Splenomegaly 09/26/2021   Atrophy of muscle of multiple sites 08/12/2021    Inflammatory polyarthropathy ([place]) 08/12/2021   Chronic pain syndrome 08/12/2021   Depression, major, single episode, moderate ([place]) 08/12/2021   Arthritis of left knee 06/08/2021   Acute medial meniscal tear, left, initial encounter    Chronic meniscal tear of knee 03/15/2021   Dysphagia 04/17/2020   LVH (left ventricular hypertrophy) 11/07/2019   Heme positive stool 10/03/2019   Anemia 10/03/2019   Insulin dependent type 2 diabetes mellitus ([place]) 08/26/2019   Chronic diastolic heart failure ([place]) 08/26/2019   Rash and nonspecific skin eruption 08/26/2019   Foot pain 08/22/2019   Right heart failure ([place]) 08/07/2019   Leg swelling 07/28/2019   Educated about COVID-19 virus infection 07/28/2019   Diabetes ([place]) 06/12/2019   Urticaria 05/15/2019   Dermatitis due to drug 04/18/2019   Edema 03/29/2019   Erectile dysfunction 03/29/2019   High risk medication use 03/29/2019   History of iron deficiency 03/29/2019   Pulmonary hypertension ([place]) 03/29/2019   Venous stasis dermatitis of both lower extremities 03/29/2019   Mixed hyperlipidemia 02/14/2019   Onychomycosis due to dermatophyte 11/06/2018   Hammer toe of right foot 07/17/2018   Tinea pedis of both feet 07/17/2018   History of radiofrequency ablation (RFA) procedure for cardiac arrhythmia 06/21/2018   Lumbar radiculopathy 06/21/2018   Hepatic cirrhosis ([place]) 12/26/2017   Left lower quadrant pain 12/05/2017   Change in bowel habits 12/05/2017   Palpitations 10/03/2017   SOB (shortness of breath) 04/12/2017   Chest pain 08/28/2016   Unstable angina ([place]) 08/26/2016   Longstanding persistent atrial fibrillation ([place]) 04/21/2015   Deviated nasal septum 02/19/2014   History of colon polyps 11/22/2012   Syncope 10/17/2012   Acute asthmatic bronchitis 11/22/2011   Benign neoplasm of colon 10/13/2011   Unspecified gastritis and gastroduodenitis without mention of hemorrhage 10/13/2011   GERD (gastroesophageal reflux disease)  10/13/2011   Body mass index (BMI) of 31.0-31.9 in adult 09/30/2011   Dyslipidemia 06/23/2011   Neuropathy 06/23/2011   Other fatigue 05/13/2011   Malaise and fatigue 05/13/2011   Rectal bleeding 04/18/2011   Atrial flutter ([place]) 04/18/2011   Chronic anticoagulation 11/10/2010   PAF (paroxysmal atrial fibrillation) (HCC)    Tobacco abuse    Moderate COPD (chronic obstructive pulmonary disease) ([place]) 02/11/2010   Essential hypertension 01/28/2010   OSA (obstructive sleep apnea) 01/28/2010   Past Medical History:  Diagnosis Date   Arthritis    lower back   Asthma    Atrial fibrillation [facility])    Atrial flutter ([place])    s/p CTI ablation by Dr Rayann Heman   Brain aneurysm 2009   questionable. A follow up CTA in 2009 showed no evidence of   Chronic back pain    DDD/stenosis   Colon polyps  9 polyps removed 2/77/82   Complication of anesthesia 09/11/2012   slow to awaken after ablation   COPD (chronic obstructive pulmonary disease) ([place])    Diabetes mellitus    takes Metformin and Glimepiride daily   Emphysema    GERD (gastroesophageal reflux disease)    takes Omeprazole bid   Heart failure (HCC)    History of shingles    HLD (hyperlipidemia)    takes Pravastatin daily   HTN (hypertension)    takes Prinizide daily   Obesity    OSA (obstructive sleep apnea)    not always using cpap   Overdose 2009   unintentional Flecanide overdose   Peripheral neuropathy    Short-term memory loss    Tobacco abuse     Family History  Problem Relation Age of Onset   Diabetes Mother    Heart disease Father    Lung cancer Father    Diabetes Maternal Grandmother    Colon cancer Neg Hx    Anesthesia problems Neg Hx    Hypotension Neg Hx    Malignant hyperthermia Neg Hx    Pseudochol deficiency Neg Hx    Esophageal cancer Neg Hx    Pancreatic cancer Neg Hx    Stomach cancer Neg Hx     Past Surgical History:  Procedure Laterality Date   ANTERIOR CERVICAL DECOMP/DISCECTOMY FUSION   01/04/2012   Procedure: ANTERIOR CERVICAL DECOMPRESSION/DISCECTOMY FUSION 1 LEVEL/HARDWARE REMOVAL;  Surgeon: Ophelia Charter, MD;  Location: [place] [facility];  Service: Neurosurgery;  Laterality: N/A;  explore cervical fusion Cervical six - seven  with removal of codman plate anterior cervical decompression with fusion interbody prothesis plating and bonegraft   APPENDECTOMY  2[place]48yr ago   [place] 09/[place]   PT DID NOT HAVE AN ATRIAL FIBRILLATION ABLATION IN 2014!  ATRIAL FLUTTER ABLATION ONLY   ATRIAL FLUTTER ABLATION  09/11/2012   CTI ablation by Dr ARayann Heman  BIOPSY  11/07/2019   Procedure: BIOPSY;  Surgeon: JMilus Banister MD;  [facility]: [facility];  Service: Endoscopy;;   CARDIAC CATHETERIZATION  2008   no significant CAD   CARDIOVERSION  05/05/2011   Procedure: CARDIOVERSION;  Surgeon: BLelon Perla MD;  [place] [facility] [place]  Service: Cardiovascular;  Laterality: N/A;   CARDIOVERSION Bilateral 07/26/2012   Procedure: CARDIOVERSION;  Surgeon: JMinus Breeding MD;  [facility];  Service: Cardiovascular;  Laterality: Bilateral;   CARPAL TUNNEL RELEASE  99/2000   bilateral   COLONOSCOPY WITH PROPOFOL N/A 12/13/2012   Procedure: COLONOSCOPY WITH PROPOFOL;  Surgeon: DMilus Banister MD;  [facility];  Service: Endoscopy;  Laterality: N/A;   ELECTROPHYSIOLOGIC STUDY N/A 04/21/2015   Procedure: Atrial Fibrillation Ablation;  Surgeon: JThompson Grayer MD;  [facility];  Service: Cardiovascular;  Laterality: N/A;   ESOPHAGOGASTRODUODENOSCOPY (EGD) WITH PROPOFOL N/A 11/07/2019   Procedure: ESOPHAGOGASTRODUODENOSCOPY (EGD) WITH PROPOFOL;  Surgeon: JMilus Banister MD;  [facility];  Service: Endoscopy;  Laterality: N/A;   HERNIA REPAIR     KNEE ARTHROSCOPY WITH MEDIAL MENISECTOMY Left 04/23/2021   Procedure: LEFT KNEE ARTHROSCOPY WITH PARTIAL MEDIAL MENISCECTOMY;  Surgeon: YMarybelle Killings MD;  [facility] [facility]  Service:  Orthopedics;  Laterality: Left;   KNEE SURGERY  6-762yrago   left   LEFT HEART CATH AND CORONARY ANGIOGRAPHY N/A 09/19/2016   Procedure: Left Heart Cath and Coronary Angiography;  Surgeon: SmBelva CromeMD;  [facility];  Service: Cardiovascular;  Laterality:  N/A;   Left inguinal hernia repair     as a child   NASAL SEPTOPLASTY W/ TURBINOPLASTY Bilateral 02/19/2014   Procedure: NASAL SEPTOPLASTY WITH BILATERAL TURBINATE REDUCTION;  Surgeon: Jodi Marble, MD;  Location: [place];  Service: ENT;  Laterality: Bilateral;   NECK SURGERY  61yr ago   right shoulder surgery  4-525yrago   cyst removed   TEE WITHOUT CARDIOVERSION  05/05/2011   Procedure: TRANSESOPHAGEAL ECHOCARDIOGRAM (TEE);  Surgeon: BrLelon PerlaMD;  [facility];  Service: Cardiovascular;  Laterality: N/A;   TEE WITHOUT CARDIOVERSION N/A 04/21/2015   Procedure: TRANSESOPHAGEAL ECHOCARDIOGRAM (TEE);  Surgeon: TrSueanne MargaritaMD;  [facility];  Service: Cardiovascular;  Laterality: N/A;   THROAT SURGERY  [id number]   "thought " it was cancer but came back not   TONSILLECTOMY     Social History   Occupational History   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

## 2022-02-22 NOTE — Progress Notes (Signed)
Chief Complaint  Patient presents with   Foot Ulcer    Patient is here for great toe ulcer, the patient states that his dog was chewing on great toe 1 week ago.patient states that the toe nail is painful.    Subjective:  66 y.o. male with PMHx of diabetes mellitus presenting to the office today for new development of ulcers that have developed of the right great toe.  Patient states that his dogs have been chewing on his toes.  He is currently on oral antibiotics prescribed by his PCP and prescribed a topical antibiotic to apply.  Past Medical History:  Diagnosis Date   Arthritis    lower back   Asthma    Atrial fibrillation [facility]    Atrial flutter [facility])    s/p CTI ablation by Dr Rayann Heman   Brain aneurysm 2009   questionable. A follow up CTA in 2009 showed no evidence of   Chronic back pain    DDD/stenosis   Colon polyps    9 polyps removed 09/23/23   Complication of anesthesia 09/11/2012   slow to awaken after ablation   COPD (chronic obstructive pulmonary disease) ([place])    Diabetes mellitus    takes Metformin and Glimepiride daily   Emphysema    GERD (gastroesophageal reflux disease)    takes Omeprazole bid   Heart failure ([place])    History of shingles    HLD (hyperlipidemia)    takes Pravastatin daily   HTN (hypertension)    takes Prinizide daily   Obesity    OSA (obstructive sleep apnea)    not always using cpap   Overdose 2009   unintentional Flecanide overdose   Peripheral neuropathy    Short-term memory loss    Tobacco abuse     Past Surgical History:  Procedure Laterality Date   ANTERIOR CERVICAL DECOMP/DISCECTOMY FUSION  01/04/2012   Procedure: ANTERIOR CERVICAL DECOMPRESSION/DISCECTOMY FUSION 1 LEVEL/HARDWARE REMOVAL;  Surgeon: Ophelia Charter, MD;  Location: [place] NEURO ORS;  Service: Neurosurgery;  Laterality: N/A;  explore cervical fusion Cervical six - seven  with removal of codman plate anterior cervical decompression with fusion interbody prothesis  plating and bonegraft   APPENDECTOMY  2-27yr ago   AMaconN/A 09/11/2012   PT DID NOT HAVE AN ATRIAL FIBRILLATION ABLATION IN 2014!  ATRIAL FLUTTER ABLATION ONLY   ATRIAL FLUTTER ABLATION  09/11/2012   CTI ablation by Dr ARayann Heman  BIOPSY  11/07/2019   Procedure: BIOPSY;  Surgeon: JMilus Banister MD;  [facility];  Service: Endoscopy;;   CARDIAC CATHETERIZATION  2008   no significant CAD   CARDIOVERSION  05/05/2011   Procedure: CARDIOVERSION;  Surgeon: BLelon Perla MD;  [place]  Service: Cardiovascular;  Laterality: N/A;   CARDIOVERSION Bilateral 07/26/2012   Procedure: CARDIOVERSION;  Surgeon: JMinus Breeding MD;  [facility];  Service: Cardiovascular;  Laterality: Bilateral;   CARPAL TUNNEL RELEASE  99/2000   bilateral   COLONOSCOPY WITH PROPOFOL N/A 12/13/2012   Procedure: COLONOSCOPY WITH PROPOFOL;  Surgeon: DMilus Banister MD;  [facility];  Service: Endoscopy;  Laterality: N/A;   ELECTROPHYSIOLOGIC STUDY N/A 04/21/2015   Procedure: Atrial Fibrillation Ablation;  Surgeon: JThompson Grayer MD;  [place] [facility];  Service: Cardiovascular;  Laterality: N/A;   ESOPHAGOGASTRODUODENOSCOPY (EGD) WITH PROPOFOL N/A 11/07/2019   Procedure: ESOPHAGOGASTRODUODENOSCOPY (EGD) WITH PROPOFOL;  Surgeon: JMilus Banister MD;  [facility];  Service: Endoscopy;  Laterality: N/A;  HERNIA REPAIR     KNEE ARTHROSCOPY WITH MEDIAL MENISECTOMY Left 04/23/2021   Procedure: LEFT KNEE ARTHROSCOPY WITH PARTIAL MEDIAL MENISCECTOMY;  Surgeon: [name], MD;  Location: [place];  Service: Orthopedics;  Laterality: Left;   KNEE SURGERY  6-59yr ago   left   LEFT HEART CATH AND CORONARY ANGIOGRAPHY N/A 09/19/2016   Procedure: Left Heart Cath and Coronary Angiography;  Surgeon: SBelva Crome MD;  [facility];  Service: Cardiovascular;  Laterality: N/A;   Left inguinal hernia repair     as a child   NASAL  SEPTOPLASTY W/ TURBINOPLASTY Bilateral 02/19/2014   Procedure: NASAL SEPTOPLASTY WITH BILATERAL TURBINATE REDUCTION;  Surgeon: KJodi Marble MD;  [place]  Service: ENT;  Laterality: Bilateral;   NECK SURGERY  150yrago   right shoulder surgery  [id number]   cyst removed   TEE WITHOUT CARDIOVERSION  05/05/2011   Procedure: TRANSESOPHAGEAL ECHOCARDIOGRAM (TEE);  Surgeon: BriLelon PerlaD;  [facility];  Service: Cardiovascular;  Laterality: N/A;   TEE WITHOUT CARDIOVERSION N/A 04/21/2015   Procedure: TRANSESOPHAGEAL ECHOCARDIOGRAM (TEE);  Surgeon: TraSueanne MargaritaD;  [facility];  Service: Cardiovascular;  Laterality: N/A;   THROAT SURGERY  [id number]   "thought " it was cancer but came back not   TONSILLECTOMY      Allergies  Allergen Reactions   Adhesive [Tape]     itching   Latex Itching    When tape is on the skin too long skin gets red & itching         Objective/Physical Exam General: The patient is alert and oriented x3 in no acute distress.  Dermatology:  Ulcers noted to the dorsal medial and plantar medial aspect of the right great toe each measuring approximately 1.5 x 0.8 x 0.2 cm (LxWxD)  To the noted ulceration(s), there is no eschar. There is a moderate amount of slough, fibrin, and necrotic tissue noted. Granulation tissue and wound base is red. There is a minimal amount of serosanguineous drainage noted. There is no exposed bone muscle-tendon ligament or joint. There is no malodor. Periwound integrity is intact. Skin is warm, dry and supple bilateral lower extremities.  Vascular: Palpable pedal pulses bilaterally. No edema or erythema noted. Capillary refill within normal limits.  Neurological: Light touch and protective threshold diminished bilaterally.   Musculoskeletal Exam: No prior amputations  Assessment: 1.  Ulcers right great toe secondary to diabetes mellitus 2. diabetes mellitus w/ peripheral neuropathy   Plan of  Care:  1. Patient was evaluated. 2. medically necessary excisional debridement including subcutaneous tissue was performed using a tissue nipper and a chisel blade. Excisional debridement of all the necrotic nonviable tissue down to healthy bleeding viable tissue was performed with post-debridement measurements same as pre-. 3. the wound was cleansed and dry sterile dressing applied. 4.  Continue the oral antibiotic until completed that was prescribed by PCP  5.  Continue topical antibiotic ointment with a dressing.  Stressed the importance of restraining his dogs to lick and chew on his toes  6.  Patient is to return to clinic in 2 weeks.   BrenEdrick KinsM [facility]  Dr. BrenEdrick KinsM    [address]  [place], [place] [address]                Office [phone]  Fax [phone]

## 2022-02-23 ENCOUNTER — Ambulatory Visit: Payer: Commercial Managed Care - HMO | Attending: Cardiology

## 2022-02-24 ENCOUNTER — Ambulatory Visit: Payer: Commercial Managed Care - HMO | Attending: Cardiology

## 2022-02-24 DIAGNOSIS — I4892 Unspecified atrial flutter: Secondary | ICD-10-CM

## 2022-02-24 DIAGNOSIS — Z7901 Long term (current) use of anticoagulants: Secondary | ICD-10-CM | POA: Diagnosis not present

## 2022-02-24 DIAGNOSIS — I480 Paroxysmal atrial fibrillation: Secondary | ICD-10-CM

## 2022-02-24 LAB — POCT INR: INR: 1.9 — AB (ref 2.0–3.0)

## 2022-02-24 NOTE — Patient Instructions (Signed)
Description   Take 1 tablet today and then continue 1/2 tablet daily except for 1 tablet every Mondays and Fridays.  Recheck INR in 2 weeks. [phone]

## 2022-03-01 ENCOUNTER — Other Ambulatory Visit (INDEPENDENT_AMBULATORY_CARE_PROVIDER_SITE_OTHER): Payer: Commercial Managed Care - HMO

## 2022-03-01 ENCOUNTER — Encounter: Payer: Self-pay | Admitting: Physician Assistant

## 2022-03-01 ENCOUNTER — Ambulatory Visit: Payer: Commercial Managed Care - HMO | Admitting: Physician Assistant

## 2022-03-01 VITALS — BP 108/60 | HR 63 | Ht 74.0 in | Wt 243.0 lb

## 2022-03-01 DIAGNOSIS — R161 Splenomegaly, not elsewhere classified: Secondary | ICD-10-CM

## 2022-03-01 DIAGNOSIS — R29898 Other symptoms and signs involving the musculoskeletal system: Secondary | ICD-10-CM | POA: Diagnosis not present

## 2022-03-01 DIAGNOSIS — K7460 Unspecified cirrhosis of liver: Secondary | ICD-10-CM | POA: Diagnosis not present

## 2022-03-01 DIAGNOSIS — M6281 Muscle weakness (generalized): Secondary | ICD-10-CM | POA: Diagnosis not present

## 2022-03-01 DIAGNOSIS — Z86010 Personal history of colonic polyps: Secondary | ICD-10-CM

## 2022-03-01 LAB — COMPREHENSIVE METABOLIC PANEL
ALT: 9 U/L (ref 0–53)
AST: 18 U/L (ref 0–37)
Albumin: 4.2 g/dL (ref 3.5–5.2)
Alkaline Phosphatase: 121 U/L — ABNORMAL HIGH (ref 39–117)
BUN: 22 mg/dL (ref 6–23)
CO2: 31 mEq/L (ref 19–32)
Calcium: 9.7 mg/dL (ref 8.4–10.5)
Chloride: 102 mEq/L (ref 96–112)
Creatinine, Ser: 1.5 mg/dL (ref 0.40–1.50)
GFR: 48.97 mL/min — ABNORMAL LOW (ref >60.00)
Glucose, Bld: 137 mg/dL — ABNORMAL HIGH (ref 70–99)
Potassium: 4.1 mEq/L (ref 3.5–5.1)
Sodium: 141 mEq/L (ref 135–145)
Total Bilirubin: 0.5 mg/dL (ref 0.2–1.2)
Total Protein: 7.8 g/dL (ref 6.0–8.3)

## 2022-03-01 LAB — CBC WITH DIFFERENTIAL/PLATELET
Basophils Absolute: 0 10*3/uL (ref 0.0–0.1)
Basophils Relative: 0.6 % (ref 0.0–3.0)
Eosinophils Absolute: 0.1 10*3/uL (ref 0.0–0.7)
Eosinophils Relative: 2.5 % (ref 0.0–5.0)
HCT: 33.5 % — ABNORMAL LOW (ref 39.0–52.0)
Hemoglobin: 11.2 g/dL — ABNORMAL LOW (ref 13.0–17.0)
Lymphocytes Relative: 20 % (ref 12.0–46.0)
Lymphs Abs: 1.2 10*3/uL (ref 0.7–4.0)
MCHC: 33.5 g/dL (ref 30.0–36.0)
MCV: 89.5 fl (ref 78.0–100.0)
Monocytes Absolute: 0.5 10*3/uL (ref 0.1–1.0)
Monocytes Relative: 7.8 % (ref 3.0–12.0)
Neutro Abs: 4.1 10*3/uL (ref 1.4–7.7)
Neutrophils Relative %: 69.1 % (ref 43.0–77.0)
Platelets: 141 10*3/uL — ABNORMAL LOW (ref 150.0–400.0)
RBC: 3.74 Mil/uL — ABNORMAL LOW (ref 4.22–5.81)
RDW: 16.6 % — ABNORMAL HIGH (ref 11.5–15.5)
WBC: 5.9 10*3/uL (ref 4.0–10.5)

## 2022-03-01 LAB — SEDIMENTATION RATE: Sed Rate: 67 mm/hr — ABNORMAL HIGH (ref 0–20)

## 2022-03-01 LAB — FOLATE: Folate: 11 ng/mL (ref >5.9)

## 2022-03-01 LAB — VITAMIN B12: Vitamin B-12: 841 pg/mL (ref 211–911)

## 2022-03-01 LAB — C-REACTIVE PROTEIN: CRP: 2.8 mg/dL (ref 0.5–20.0)

## 2022-03-01 NOTE — Progress Notes (Signed)
Subjective:    Patient ID: [name], male    DOB: 07-30-1955, 66 y.o.   MRN: [id number]  HPI [name] is a 66 year old white male, established with Dr. Ardis Hughs who was last seen in November 2022 by myself.  He was seen at that time with complaints of dysphagia and was to be scheduled for a barium swallow which he did not follow through with. He is referred back today by PCP Leretha Pol NP after abdominal ultrasound which was done in May 2023 showed an enlarged liver and enlarged spleen.  Patient has a previous diagnosis of cirrhosis, likely secondary to prior chronic EtOH use (inactive x10 years).  Extensive serologic work-up in the past all negative.  He also has previously diagnosed hepatosplenomegaly.  Cirrhosis to date has been compensated. He had last undergone EGD per Dr. Ardis Hughs in June 2021, no esophageal stricture noted or hiatal hernia, no esophageal varices, mild gastritis, no signs of portal hypertension. Last colonoscopy October 2019 with removal of 3 polyps all 3 to 5 mm in size-path showed 1 tubular adenoma and 2 were sessile serrated polyps and is indicated for 5-year interval follow-up.  He has multiple comorbidities including atrial fibrillation/a flutter, on chronic Coumadin, pulmonary hypertension, congestive heart failure, COPD no oxygen use, sleep apnea, probably arthropathy, insulin-dependent diabetes mellitus, and GERD.  Regarding his dysphagia, he says this has not worsened since he was last here and is long as he watches what he eats and avoids dry bread and larger chunks of food he usually does fine.  He does get a sensation that food is slow to traverse his esophagus and usually will drink liquids to help push the food down.  He is not having any episodes requiring regurgitation.  He last had labs in May 2023 with albumin 4.2/alk phos 141/AST 27/ALT 16, acute hepatitis serologies were negative. Ultrasound from May 2023 showed moderate gallbladder sludge, mild wall  thickening to 4 mm, trace pericholecystic fluid/CBD 6 mm liver enlarged with diffuse inhomogeneous echogenicity, portal vein patent and splenomegaly noted to 21.4 cm.  He has no current complaints of abdominal pain, says sometimes he has some discomfort in the upper abdomen which she has had for years and has attributed to leaning over on his abdomen for work long-term. No current heartburn or indigestion symptoms.  Bowel movements have been normal, no melena.  He seems most concerned about generalized muscle weakness and muscle loss which she says has occurred over the past 1 to 2 years.  He is also complaining of generalized weakness.  He has developed a tremor in both hands over the past couple of years and he says sometimes this is bad.  He feels that some of the muscle weakness seem to develop after he had been vaccinated for COVID but that may be coincidental.  He says that he has asked for a neurology referral but that has not been done by his PCP. Reviewing his medications he is on protocol and has been on that long-term.  Review of Systems Pertinent positive and negative review of systems were noted in the above HPI section.  All other review of systems was otherwise negative.   Outpatient Encounter Medications as of 03/01/2022  Medication Sig   albuterol (PROAIR HFA) 108 (90 Base) MCG/ACT inhaler Inhale 1-2 puffs into the lungs every 6 (six) hours as needed for wheezing or shortness of breath.   albuterol (PROVENTIL) (2.5 MG/3ML) 0.083% nebulizer solution Take 3 mLs (2.5 mg total) by nebulization every  6 (six) hours as needed for wheezing or shortness of breath.   b complex vitamins tablet Take 1 tablet by mouth daily.    bisoprolol (ZEBETA) 10 MG tablet Take 10 mg by mouth daily.    colchicine 0.6 MG tablet TAKE 2 TABLETS BY MOUTH ONCE. THEN TAKE 1 TABLET 1 HOUR LATER. WAIT 2 DAYS, THEN BEGIN TAKING 1 TABLET DAILY TO PREVENT ACUTE GOUT.   Continuous Blood Gluc Receiver (DEXCOM G6  RECEIVER) DEVI    Continuous Blood Gluc Receiver (DEXCOM G7 RECEIVER) DEVI Use as instructed to check blood sugar   Continuous Blood Gluc Sensor (DEXCOM G6 SENSOR) MISC USE AS DIRECTED   Continuous Blood Gluc Sensor (DEXCOM G7 SENSOR) MISC Use as instructed to check blood sugar   Continuous Blood Gluc Transmit (DEXCOM G6 TRANSMITTER) MISC USE AS DIRECTED   diltiazem (CARDIZEM CD) 120 MG 24 hr capsule Take 1 capsule (120 mg total) by mouth daily.   Dulaglutide (TRULICITY) 3 ZJ/6.7HA SOPN Inject 3 mg as directed once a week.   fluticasone-salmeterol (ADVAIR) 100-50 MCG/ACT AEPB Inhale 1 puff into the lungs 2 (two) times daily.   gabapentin (NEURONTIN) 300 MG capsule TAKE 3 CAPSULES BY MOUTH TWICE DAILY FOR NEUROPATHY   Glucagon 3 MG/DOSE POWD Place 3 mg into the nose once as needed for up to 1 dose.   insulin aspart (NOVOLOG FLEXPEN) 100 UNIT/ML FlexPen Inject 40-60 Units into the skin 3 (three) times daily with meals.   Insulin Glargine (BASAGLAR KWIKPEN) 100 UNIT/ML Inject 70 Units into the skin daily.   Insulin Pen Needle (EASY COMFORT PEN NEEDLES) 31G X 5 MM MISC To use with as needed insulin dosing up to four times daily   ketoconazole (NIZORAL) 2 % cream Apply 1 Application topically daily.   losartan (COZAAR) 50 MG tablet TAKE 1 TABLET BY MOUTH DAILY   metolazone (ZAROXOLYN) 2.5 MG tablet TAKE 1 TABLET BY MOUTH ON TUESDAY AND THURSDAY 30 MINUTES BEFORE TAKING TORSEMIDE OR AS DIRECTED   mupirocin ointment (BACTROBAN) 2 % Ply small amount to effected area twice daily for 10 days.   nitroGLYCERIN (NITROSTAT) 0.4 MG SL tablet Place 1 tablet (0.4 mg total) under the tongue every 5 (five) minutes x 3 doses as needed for chest pain.   omeprazole (PRILOSEC) 20 MG capsule Take 1 capsule (20 mg total) by mouth 2 (two) times daily.   oxyCODONE-acetaminophen (PERCOCET) 10-325 MG tablet Take 1.5 tablets by mouth in the morning, at noon, in the evening, and at bedtime.   potassium chloride SA (KLOR-CON M)  20 MEQ tablet TAKE 1 TABLET BY MOUTH DAILY. TAKE AN EXTRA TABLET ON TUESDAY AND THURSDAY   pravastatin (PRAVACHOL) 80 MG tablet TAKE 1 TABLET BY MOUTH DAILY AT BEDTIME   sulfamethoxazole-trimethoprim (BACTRIM DS) 800-160 MG tablet Take 1 tablet by mouth 2 (two) times daily.   tamsulosin (FLOMAX) 0.4 MG CAPS capsule TAKE 1 CAPSULE BY MOUTH EACH NIGHT AT BEDTIME   torsemide (DEMADEX) 20 MG tablet Take 2 tablets in the morning and 1 tablet in the PM   triamcinolone cream (KENALOG) 0.1 % Apply 1 Application topically daily as needed.   umeclidinium bromide (INCRUSE ELLIPTA) 62.5 MCG/ACT AEPB Inhale 1 puff into the lungs daily.   VITAMIN A PO Take 2,400 mcg by mouth daily.    Vitamin D, Ergocalciferol, (DRISDOL) 1.25 MG (50000 UNIT) CAPS capsule TAKE 1 CAPSULE BY MOUTH EVERY 7 DAYS   warfarin (COUMADIN) 5 MG tablet Take 1/2 to 1 tablet daily or as directed  cyclobenzaprine (FLEXERIL) 10 MG tablet Take 10 mg by mouth 3 (three) times daily. (Patient not taking: Reported on 03/01/2022)   No facility-administered encounter medications on file as of 03/01/2022.   Allergies  Allergen Reactions   Adhesive [Tape]     itching   Latex Itching    When tape is on the skin too long skin gets red & itching   Patient Active Problem List   Diagnosis Date Noted   Neoplasm of uncertain behavior of skin of lower leg 01/31/2022   Cutaneous candidiasis 01/31/2022   Spinal stenosis, lumbar region, with neurogenic claudication 10/14/2021   S/P cervical spinal fusion 10/14/2021   Chronic pain of left knee 10/14/2021   Chronic painful diabetic neuropathy ([place]) 10/14/2021   Abdominal discomfort 09/26/2021   Hepatomegaly 09/26/2021   Splenomegaly 09/26/2021   Atrophy of muscle of multiple sites 08/12/2021   Inflammatory polyarthropathy ([place]) 08/12/2021   Chronic pain syndrome 08/12/2021   Depression, major, single episode, moderate ([place]) 08/12/2021   Arthritis of left knee 06/08/2021   Acute medial meniscal  tear, left, initial encounter    Chronic meniscal tear of knee 03/15/2021   Dysphagia 04/17/2020   LVH (left ventricular hypertrophy) 11/07/2019   Heme positive stool 10/03/2019   Anemia 10/03/2019   Insulin dependent type 2 diabetes mellitus ([place]) 08/26/2019   Chronic diastolic heart failure ([place]) 08/26/2019   Rash and nonspecific skin eruption 08/26/2019   Foot pain 08/22/2019   Right heart failure ([place]) 08/07/2019   Leg swelling 07/28/2019   Educated about COVID-19 virus infection 07/28/2019   Diabetes ([place]) 06/12/2019   Urticaria 05/15/2019   Dermatitis due to drug 04/18/2019   Edema 03/29/2019   Erectile dysfunction 03/29/2019   High risk medication use 03/29/2019   History of iron deficiency 03/29/2019   Pulmonary hypertension ([place]) 03/29/2019   Venous stasis dermatitis of both lower extremities 03/29/2019   Mixed hyperlipidemia 02/14/2019   Onychomycosis due to dermatophyte 11/06/2018   Hammer toe of right foot 07/17/2018   Tinea pedis of both feet 07/17/2018   History of radiofrequency ablation (RFA) procedure for cardiac arrhythmia 06/21/2018   Lumbar radiculopathy 06/21/2018   Hepatic cirrhosis ([place]) 12/26/2017   Left lower quadrant pain 12/05/2017   Change in bowel habits 12/05/2017   Palpitations 10/03/2017   SOB (shortness of breath) 04/12/2017   Chest pain 08/28/2016   Unstable angina ([place]) 08/26/2016   Longstanding persistent atrial fibrillation ([place]) 04/21/2015   Deviated nasal septum 02/19/2014   History of colon polyps 11/22/2012   Syncope 10/17/2012   Acute asthmatic bronchitis 11/22/2011   Benign neoplasm of colon 10/13/2011   Unspecified gastritis and gastroduodenitis without mention of hemorrhage 10/13/2011   GERD (gastroesophageal reflux disease) 10/13/2011   Body mass index (BMI) of 31.0-31.9 in adult 09/30/2011   Dyslipidemia 06/23/2011   Neuropathy 06/23/2011   Other fatigue 05/13/2011   Malaise and fatigue 05/13/2011   Rectal bleeding  04/18/2011   Atrial flutter ([place]) 04/18/2011   Chronic anticoagulation 11/10/2010   PAF (paroxysmal atrial fibrillation) (H[facility])    Tobacco abuse    Moderate COPD (chronic obstructive pulmonary disease) ([place]) 02/11/2010   Essential hypertension 01/28/2010   OSA (obstructive sleep apnea) 01/28/2010   Social History   Socioeconomic History   Marital status: Married    Spouse name: Not on file   Number of children: 2   Years of education: Not on file   Highest education level: Not on file  Occupational History   Occupation: Dealer  Tobacco Use   Smoking status: Every Day    Packs/day: 1.00    Years: 49.00    Total pack years: 49.00    Types: Cigarettes   Smokeless tobacco: Never   Tobacco comments:    Smoking a half a pack per day as of 10/29/21 hfb  Vaping Use   Vaping Use: Never used  Substance and Sexual Activity   Alcohol use: Not Currently    Alcohol/week: 0.0 standard drinks of alcohol    Comment: 12/05/17 pt stated he has drinked 1/2 drink in 3 years   Drug use: No   Sexual activity: Yes  Other Topics Concern   Not on file  Social History Narrative   Daily caffeine(Mountain Dew)       Lives in [place] with spouse.   Unemployed due to chronic back/ leg pain         Social Determinants of Health   Financial Resource Strain: Not on file  Food Insecurity: Not on file  Transportation Needs: Not on file  Physical Activity: Not on file  Stress: Not on file  Social Connections: Not on file  Intimate Partner Violence: Not on file    [name] family history includes Diabetes in his maternal grandmother and mother; Heart disease in his father; Lung cancer in his father.      Objective:    Vitals:   03/01/22 1333  BP: 108/60  Pulse: 63  SpO2: 95%    Physical Exam Well-developed chronically ill-appearing older white male in no acute distress.  Height, Weight, 243 BMI 31.2, ambulates with a cane  HEENT; nontraumatic normocephalic, EOMI, PE R LA,  sclera anicteric. Oropharynx; not examined today Neck; supple, no JVD Cardiovascular; regular rate and rhythm with S1-S2, no murmur rub or gallop Pulmonary; Clear bilaterally, but decreased bilaterally Abdomen; protuberant , obese, nondistended, no fluid wave, no palpable mass liver is palpable down 3 fingerbreadths from the right costal margin, cannot palpate spleen bowel sounds are active- Rectal; not done today Skin; benign exam, no jaundice rash or appreciable lesions Extremities; no clubbing cyanosis or edema skin warm and dry Neuro/Psych; alert and oriented x4, non intention rolling tremor       Assessment & Plan:   #70 66 year old white male referred back regarding hepatosplenomegaly noted on ultrasound 09/2021.  Reviewing previous imaging, patient has had documented hepatosplenomegaly at least as far back as 2019, and has previous diagnosis of cirrhosis probably EtOH induced with negative prior serologic work-up. To date cirrhosis has been compensated. EGD 2021 no varices or evidence of portal hypertensive gastropathy  #2 mild solid food dysphagia-no evidence for stricture or ring on EGD June 2021.  Patient says these symptoms are stable, he does not want to pursue barium swallow or procedure at this time  #3 history of adenomatous and sessile serrated polyps-up-to-date with colonoscopy will be due for follow-up 2024  #4 complaints of muscle weakness diffusely and muscle loss, somewhat progressive tremor.  Symptoms have occurred over the past 1 to 2 years Etiology is not clear Has been on Pravachol which has association with myopathy and this needs to be considered. Consider a polymyositis. Consider underlying neurologic disorder  #5 gallbladder sludge #6 pulmonary hypertension/congestive heart failure 7.  Atrial fib flutter on chronic Coumadin 8.  Hypertension 9.  COPD 10.  Sleep apnea 11.  Insulin-dependent diabetes mellitus  Plan; maintenance labs today for follow-up  of cirrhosis, check AFP, CBC, c-Met. We will check sed rate, CRP, B12 and B1/thiamine for  initial evaluation of diffuse muscle weakness. Need to consider stopping Pravachol Will await labs and then discuss with cardiology Will proceed with referral to neurology/[place] Patient will be due for follow-up colonoscopy in fall 2024.  Could consider follow-up EGD at that time for repeat variceal surveillance.  Coumadin will need to be held.  On conversation with the patient relates that Dr. Percival Spanish his cardiologist has told him that he should not undergo any further anesthesia.  This will need to be discussed with cardiology, and patient may not be a candidate for any further surveillance endoscopic evaluation   Katiejo Gilroy Genia Harold PA-C 03/01/2022   Cc: Ronnell Freshwater, NP

## 2022-03-01 NOTE — Patient Instructions (Addendum)
If you are age 64 or younger, your body mass index should be between 19-25. Your Body mass index is 31.2 kg/m. If this is out of the aformentioned range listed, please consider follow up with your Primary Care Provider.   __________________________________________________________  The [place] [facility] providers would like to encourage you to use [facility] to communicate with providers for non-urgent requests or questions.  Due to long hold times on the telephone, sending your provider a message by [facility] may be a faster and more efficient way to get a response.  Please allow 48 business hours for a response.  Please remember that this is for non-urgent requests.   Your provider has requested that you go to the basement level for lab work before leaving today. Press "B" on the elevator. The lab is located at the first door on the left as you exit the elevator.  You have been referred to [facility].  They should call you to schedule an appointment.    Thank you for choosing me and [facility].  Edward Jolly, PA-C

## 2022-03-02 ENCOUNTER — Telehealth (INDEPENDENT_AMBULATORY_CARE_PROVIDER_SITE_OTHER): Payer: Commercial Managed Care - HMO | Admitting: Nurse Practitioner

## 2022-03-02 ENCOUNTER — Encounter: Payer: Self-pay | Admitting: Nurse Practitioner

## 2022-03-02 VITALS — BP 122/68 | HR 66 | Ht 74.0 in | Wt 247.8 lb

## 2022-03-02 DIAGNOSIS — G629 Polyneuropathy, unspecified: Secondary | ICD-10-CM | POA: Diagnosis not present

## 2022-03-02 DIAGNOSIS — L03031 Cellulitis of right toe: Secondary | ICD-10-CM

## 2022-03-02 DIAGNOSIS — R531 Weakness: Secondary | ICD-10-CM | POA: Diagnosis not present

## 2022-03-02 DIAGNOSIS — I10 Essential (primary) hypertension: Secondary | ICD-10-CM

## 2022-03-02 DIAGNOSIS — R251 Tremor, unspecified: Secondary | ICD-10-CM

## 2022-03-02 DIAGNOSIS — M064 Inflammatory polyarthropathy: Secondary | ICD-10-CM

## 2022-03-02 DIAGNOSIS — I4811 Longstanding persistent atrial fibrillation: Secondary | ICD-10-CM

## 2022-03-02 MED ORDER — SULFAMETHOXAZOLE-TRIMETHOPRIM 800-160 MG PO TABS: 1.0000 | ORAL_TABLET | Freq: Two times a day (BID) | ORAL | 0 refills | Status: DC

## 2022-03-02 NOTE — Progress Notes (Signed)
I agree with the assessment and plan as outlined by Ms. [place]. Please ensure that he is getting RUQ U/S every 6 months for [facility] screening due to history of cirrhosis. It looks like he should be due for an U/S now. I agree with EGD/colonoscopy in 02/2022 for polyp surveillance and variceal screening if he is able to get cardiology clearance. RTC in 6 months for follow up.

## 2022-03-02 NOTE — Progress Notes (Signed)
Established patient visit   Patient: [name]   DOB: 02/26/1956   66 y.o. Male  MRN: [id number] Visit Date: 03/02/2022   Chief Complaint  Patient presents with   Follow-up   Subjective    HPI  Type 2 diabetes  -now seeing endocrinology  -sees provider associated with [facility] endocrinology.  Generalized weakness along with tremor  -referred again to [facility] Neurology for further evaluation  -patient frustrated with increased weakness and tremor. Treated for cellulitis of right great toe. Finished antibiotics today.  -looks better. Some redness and continues to ooze some blood.   Medications: Outpatient Medications Prior to Visit  Medication Sig   albuterol (PROAIR HFA) 108 (90 Base) MCG/ACT inhaler Inhale 1-2 puffs into the lungs every 6 (six) hours as needed for wheezing or shortness of breath.   albuterol (PROVENTIL) (2.5 MG/3ML) 0.083% nebulizer solution Take 3 mLs (2.5 mg total) by nebulization every 6 (six) hours as needed for wheezing or shortness of breath.   b complex vitamins tablet Take 1 tablet by mouth daily.    bisoprolol (ZEBETA) 10 MG tablet Take 10 mg by mouth daily.    colchicine 0.6 MG tablet TAKE 2 TABLETS BY MOUTH ONCE. THEN TAKE 1 TABLET 1 HOUR LATER. WAIT 2 DAYS, THEN BEGIN TAKING 1 TABLET DAILY TO PREVENT ACUTE GOUT.   Continuous Blood Gluc Receiver (DEXCOM G6 RECEIVER) DEVI    Continuous Blood Gluc Receiver (DEXCOM G7 RECEIVER) DEVI Use as instructed to check blood sugar   Continuous Blood Gluc Transmit (DEXCOM G6 TRANSMITTER) MISC USE AS DIRECTED   diltiazem (CARDIZEM CD) 120 MG 24 hr capsule Take 1 capsule (120 mg total) by mouth daily.   Dulaglutide (TRULICITY) 3 EV/0.3JK SOPN Inject 3 mg as directed once a week.   fluticasone-salmeterol (ADVAIR) 100-50 MCG/ACT AEPB Inhale 1 puff into the lungs 2 (two) times daily.   gabapentin (NEURONTIN) 300 MG capsule TAKE 3 CAPSULES BY MOUTH TWICE DAILY FOR NEUROPATHY   Glucagon 3 MG/DOSE POWD Place 3 mg into  the nose once as needed for up to 1 dose.   insulin aspart (NOVOLOG FLEXPEN) 100 UNIT/ML FlexPen Inject 40-60 Units into the skin 3 (three) times daily with meals.   Insulin Glargine (BASAGLAR KWIKPEN) 100 UNIT/ML Inject 70 Units into the skin daily.   Insulin Pen Needle (EASY COMFORT PEN NEEDLES) 31G X 5 MM MISC To use with as needed insulin dosing up to four times daily   ketoconazole (NIZORAL) 2 % cream Apply 1 Application topically daily.   losartan (COZAAR) 50 MG tablet TAKE 1 TABLET BY MOUTH DAILY   mupirocin ointment (BACTROBAN) 2 % Ply small amount to effected area twice daily for 10 days.   nitroGLYCERIN (NITROSTAT) 0.4 MG SL tablet Place 1 tablet (0.4 mg total) under the tongue every 5 (five) minutes x 3 doses as needed for chest pain.   omeprazole (PRILOSEC) 20 MG capsule Take 1 capsule (20 mg total) by mouth 2 (two) times daily.   oxyCODONE-acetaminophen (PERCOCET) 10-325 MG tablet Take 1.5 tablets by mouth in the morning, at noon, in the evening, and at bedtime.   potassium chloride SA (KLOR-CON M) 20 MEQ tablet TAKE 1 TABLET BY MOUTH DAILY. TAKE AN EXTRA TABLET ON TUESDAY AND THURSDAY   pravastatin (PRAVACHOL) 80 MG tablet TAKE 1 TABLET BY MOUTH DAILY AT BEDTIME   tamsulosin (FLOMAX) 0.4 MG CAPS capsule TAKE 1 CAPSULE BY MOUTH EACH NIGHT AT BEDTIME   torsemide (DEMADEX) 20 MG tablet Take 2 tablets in  the morning and 1 tablet in the PM   triamcinolone cream (KENALOG) 0.1 % Apply 1 Application topically daily as needed.   umeclidinium bromide (INCRUSE ELLIPTA) 62.5 MCG/ACT AEPB Inhale 1 puff into the lungs daily.   VITAMIN A PO Take 2,400 mcg by mouth daily.    Vitamin D, Ergocalciferol, (DRISDOL) 1.25 MG (50000 UNIT) CAPS capsule TAKE 1 CAPSULE BY MOUTH EVERY 7 DAYS   warfarin (COUMADIN) 5 MG tablet Take 1/2 to 1 tablet daily or as directed   [DISCONTINUED] Continuous Blood Gluc Sensor (DEXCOM G6 SENSOR) MISC USE AS DIRECTED   [DISCONTINUED] Continuous Blood Gluc Sensor (DEXCOM G7  SENSOR) MISC Use as instructed to check blood sugar   [DISCONTINUED] metolazone (ZAROXOLYN) 2.5 MG tablet TAKE 1 TABLET BY MOUTH ON TUESDAY AND THURSDAY 30 MINUTES BEFORE TAKING TORSEMIDE OR AS DIRECTED   [DISCONTINUED] sulfamethoxazole-trimethoprim (BACTRIM DS) 800-160 MG tablet Take 1 tablet by mouth 2 (two) times daily.   cyclobenzaprine (FLEXERIL) 10 MG tablet Take 10 mg by mouth 3 (three) times daily.   No facility-administered medications prior to visit.    Review of Systems  Constitutional:  Negative for activity change, chills, fatigue and fever.  HENT:  Negative for congestion, postnasal drip, rhinorrhea, sinus pressure, sinus pain, sneezing and sore throat.   Eyes: Negative.   Respiratory:  Negative for cough, shortness of breath and wheezing.   Cardiovascular:  Negative for chest pain and palpitations.  Gastrointestinal:  Negative for constipation, diarrhea, nausea and vomiting.  Endocrine: Negative for cold intolerance, heat intolerance, polydipsia and polyuria.       Poorly controlled blood sugars, but improving. Patient now seeing endocrinology   Genitourinary:  Negative for dysuria, frequency and urgency.  Musculoskeletal:  Positive for arthralgias and joint swelling. Negative for back pain and myalgias.  Skin:  Positive for wound. Negative for rash.       Improved appearance of wound on right great toe. No longer draining. Does have scabbing noted. Contineus to be non tender.   Allergic/Immunologic: Negative for environmental allergies.  Neurological:  Positive for weakness. Negative for dizziness and headaches.  Psychiatric/Behavioral:  The patient is nervous/anxious.        Objective     Today's Vitals   03/02/22 1103  BP: 122/68  Pulse: 66  SpO2: 95%  Weight: 247 lb 12.8 oz (112.4 kg)  Height: '6\' 2"'$  (1.88 m)   Body mass index is 31.82 kg/m.  BP Readings from Last 3 Encounters:  03/08/22 116/60  03/02/22 122/68  03/01/22 108/60    Wt Readings from Last  3 Encounters:  03/08/22 244 lb 9.6 oz (110.9 kg)  03/02/22 247 lb 12.8 oz (112.4 kg)  03/01/22 243 lb (110.2 kg)    Physical Exam Vitals and nursing note reviewed.  Constitutional:      Appearance: Normal appearance. He is well-developed.  HENT:     Head: Normocephalic and atraumatic.     Mouth/Throat:     Mouth: Mucous membranes are moist.     Pharynx: Oropharynx is clear.  Eyes:     Conjunctiva/sclera: Conjunctivae normal.     Pupils: Pupils are equal, round, and reactive to light.  Cardiovascular:     Rate and Rhythm: Normal rate and regular rhythm.     Pulses: Normal pulses.     Heart sounds: Normal heart sounds.  Pulmonary:     Effort: Pulmonary effort is normal.     Breath sounds: Normal breath sounds.  Abdominal:     Palpations: Abdomen  is soft.  Musculoskeletal:        General: Normal range of motion.     Cervical back: Normal range of motion and neck supple.  Lymphadenopathy:     Cervical: No cervical adenopathy.  Skin:    General: Skin is warm and dry.     Capillary Refill: Capillary refill takes less than 2 seconds.     Comments: There is small open lesion at the base of the great toenail bed. Improved swelling and no tenderness present. No longer oozing serosanguinous fluid. Second lesion on the plantar aspect of the right great toehas some scabbing noted with overall improved appearance.   Neurological:     General: No focal deficit present.     Mental Status: He is alert and oriented to person, place, and time.  Psychiatric:        Mood and Affect: Mood normal.        Behavior: Behavior normal.        Thought Content: Thought content normal.        Judgment: Judgment normal.       Assessment & Plan    1. Cellulitis of right toe Healing but not resolved.  Extend treatment with Bactrim DS twice daily for additional 10 days.  Patient advised to contact office if symptoms do not improve or worsen following treatment with antibiotics. -  sulfamethoxazole-trimethoprim (BACTRIM DS) 800-160 MG tablet; Take 1 tablet by mouth 2 (two) times daily.  Dispense: 20 tablet; Refill: 0  2. Neuropathy Patient given a referral to neurology for GI provider for further evaluation and treatment.  3. Tremor Patient given a referral to neurology for GI provider for further evaluation and treatment.  4. Weakness Patient given a referral to neurology for GI provider for further evaluation and treatment.  5. Inflammatory polyarthropathy ([place]) Continue regular visits with pain management provider.  6. Essential hypertension Stable.  Continue meds as prescribed.  7. Longstanding persistent atrial fibrillation ([place]) Continue regular visits with Coumadin clinic and cardiology as scheduled.   Problem List Items Addressed This Visit       Cardiovascular and Mediastinum   Essential hypertension   Longstanding persistent atrial fibrillation (HCC)     Nervous and Auditory   Neuropathy     Musculoskeletal and Integument   Inflammatory polyarthropathy (HCC)     Other   Weakness   Tremor   Other Visit Diagnoses     Cellulitis of right toe    -  Primary   Relevant Medications   sulfamethoxazole-trimethoprim (BACTRIM DS) 800-160 MG tablet        Return in about 5 months (around 08/01/2022) for health maintenance exam, FBW at time of visit.         Ronnell Freshwater, NP  [facility] at [facility] [phone] (phone) [phone] (fax)  [place]

## 2022-03-03 ENCOUNTER — Telehealth: Payer: Self-pay

## 2022-03-03 NOTE — Telephone Encounter (Signed)
The referral to neurology came from his GI provider Amy Easterwood, not from me.

## 2022-03-03 NOTE — Telephone Encounter (Signed)
Patient called office to inform that he spoke with neurology, patient stated that he was told that they do not have any notes in order for him to get referral, please advise, thanks!

## 2022-03-04 ENCOUNTER — Encounter: Payer: Self-pay | Admitting: Neurology

## 2022-03-04 ENCOUNTER — Telehealth: Payer: Self-pay | Admitting: Cardiology

## 2022-03-04 NOTE — Telephone Encounter (Signed)
Patient calling to inform the coumadin clinic that his PCP continued his antibiotics for another 10 days.

## 2022-03-04 NOTE — Telephone Encounter (Signed)
Called pt he stated that his appt is already set up and hung the phone up on the [place]

## 2022-03-04 NOTE — Telephone Encounter (Signed)
Called pt and he was laying down per wife (on [place]). She states he restarted the bactrim twice a day on yesterday. Advised to have pt take 1/2 tablet of warfarin tonight them resume normal and keep leafy veggies in diet (since this med interacts with warfarin. Also, pt unable to come on Monday but can come Tuesday after Dr. Elsworth Soho appt. Wife will let pt know of this as she will be with pt.

## 2022-03-04 NOTE — Telephone Encounter (Signed)
[name] needs to send the note and the reason why she made the referral, as she made the referral before I saw him. It also takes a few days to get the referral to go through. It was just earlier this week.

## 2022-03-06 DIAGNOSIS — L03031 Cellulitis of right toe: Secondary | ICD-10-CM | POA: Insufficient documentation

## 2022-03-07 ENCOUNTER — Telehealth: Payer: Self-pay | Admitting: *Deleted

## 2022-03-07 NOTE — Telephone Encounter (Signed)
Called and spoke with patient, he verified that he is not wearing a CPAP machine and he has never worn one.  Nothing further needed.

## 2022-03-08 ENCOUNTER — Other Ambulatory Visit: Payer: Self-pay | Admitting: *Deleted

## 2022-03-08 ENCOUNTER — Ambulatory Visit: Payer: Commercial Managed Care - HMO | Admitting: Pulmonary Disease

## 2022-03-08 ENCOUNTER — Encounter: Payer: Self-pay | Admitting: Pulmonary Disease

## 2022-03-08 ENCOUNTER — Ambulatory Visit: Payer: Commercial Managed Care - HMO | Attending: Cardiology | Admitting: *Deleted

## 2022-03-08 DIAGNOSIS — Z122 Encounter for screening for malignant neoplasm of respiratory organs: Secondary | ICD-10-CM

## 2022-03-08 DIAGNOSIS — I480 Paroxysmal atrial fibrillation: Secondary | ICD-10-CM

## 2022-03-08 DIAGNOSIS — Z87891 Personal history of nicotine dependence: Secondary | ICD-10-CM

## 2022-03-08 DIAGNOSIS — I4892 Unspecified atrial flutter: Secondary | ICD-10-CM

## 2022-03-08 DIAGNOSIS — Z720 Tobacco use: Secondary | ICD-10-CM

## 2022-03-08 DIAGNOSIS — J449 Chronic obstructive pulmonary disease, unspecified: Secondary | ICD-10-CM | POA: Diagnosis not present

## 2022-03-08 DIAGNOSIS — Z7901 Long term (current) use of anticoagulants: Secondary | ICD-10-CM | POA: Diagnosis not present

## 2022-03-08 LAB — EXTRA SPECIMEN

## 2022-03-08 LAB — POCT INR: INR: 2.3 (ref 2.0–3.0)

## 2022-03-08 LAB — VITAMIN B1

## 2022-03-08 NOTE — Patient Instructions (Addendum)
  X LDCT chest for cancer screening Stay on advair & incruse  Try nicotine patches  X RSV vaccine

## 2022-03-08 NOTE — Progress Notes (Signed)
   Subjective:    Patient ID: [name], male    DOB: [birth year], 66 y.o.   MRN: [id number]  HPI  66 yo  smoker followed for COPD and obstructive sleep apnea     PMH -chronic A. 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 Last office visit was with APP. He arrives ambulating with a walker.  He has type been diagnosed with severe osteoarthritis , not a candidate for surgery per cardiology. Last office visit with me was 07/2021 I referred him to neurology for wasting of his muscle mass of both upper extremities and tremors but he never received that appointment.  He is now being rescheduled for November. He reports sees fine surgery by Dr. JArnoldo Moraleyears ago I reviewed MRI lumbar spine and he has multilevel degenerative disc disease.  He continues to smoke about a pack per day.  His dad died of lung cancer and his wife has been started on oxygen but he states that this is a bad habit that he just cannot give up.  He quit drinking years ago and has been diagnosed with cirrhosis  He was switched to Trelegy but insurance would not cover, he is now on Advair and Incruse.  He feels that his neurological symptoms may be related to COVID-vaccine and does not want the RSV vaccine He admits that he forgets to take the inhalers  Significant tests/ events reviewed  LDCT 07/2020 RADS 1 LDCT 06/2019 RADS 1 .  Mild emphysema.    Spirometry 01/2010 showed ratio of 68, FEV 1 was 72%, smaller airways 55%, no significant BD response    [facility] 31.h corrected by CPAP 5 cm (wt was 250 lbs)    09/2012 FEV1 67%, no BD response   Spirometry 2015 with FEV1  59%, ratio 61  Review of Systems neg for any significant sore throat, dysphagia, itching, sneezing, nasal congestion or excess/ purulent secretions, fever, chills, sweats, unintended wt loss, pleuritic or exertional cp, hempoptysis, orthopnea pnd or change in chronic leg swelling. Also denies presyncope, palpitations, heartburn, abdominal pain, nausea, vomiting, diarrhea or change in bowel or urinary habits, dysuria,hematuria, rash, arthralgias, visual complaints, headache, numbness weakness or ataxia.     Objective:   Physical Exam  Gen. Pleasant, well-nourished, in no distress ENT - no thrush, no pallor/icterus,no post nasal drip Neck: No JVD, no thyromegaly, no carotid bruits Lungs: no use of accessory muscles, no dullness to percussion, decreased without rales or rhonchi  Cardiovascular: Rhythm regular, heart sounds  normal, no murmurs or gallops, no peripheral edema Musculoskeletal: No deformities, no cyanosis or clubbing        Assessment & Plan:    For his muscle wasting and tremors, he now has an appointment with neurology

## 2022-03-08 NOTE — Progress Notes (Signed)
Pt. Declined the RSV vaccine d/t side effects he had from Covid vaccines.

## 2022-03-08 NOTE — Patient Instructions (Signed)
Description   Continue taking warfarin 1/2 tablet daily except for 1 tablet every Mondays and Fridays.  Recheck INR in 2 weeks. [phone]

## 2022-03-08 NOTE — Assessment & Plan Note (Signed)
Smoking cessation was again emphasized is the most important intervention for him.  I encouraged him to trial nicotine patches If that does not work, we can prescribe Nicotrol inhaler. We will schedule annual follow-up low-dose CT chest due to his concerns about lung cancer and his family history but certainly smoking cessation is paramount

## 2022-03-08 NOTE — Assessment & Plan Note (Signed)
Continue Advair and Incruse. We discussed COPD action plan and signs and symptoms of COPD exacerbation

## 2022-03-10 ENCOUNTER — Other Ambulatory Visit: Payer: Self-pay | Admitting: Cardiology

## 2022-03-10 ENCOUNTER — Ambulatory Visit: Payer: Commercial Managed Care - HMO

## 2022-03-11 ENCOUNTER — Other Ambulatory Visit: Payer: Self-pay

## 2022-03-11 DIAGNOSIS — K7460 Unspecified cirrhosis of liver: Secondary | ICD-10-CM

## 2022-03-15 ENCOUNTER — Other Ambulatory Visit: Payer: Self-pay

## 2022-03-15 ENCOUNTER — Other Ambulatory Visit: Payer: Self-pay | Admitting: Internal Medicine

## 2022-03-15 DIAGNOSIS — N1831 Chronic kidney disease, stage 3a: Secondary | ICD-10-CM

## 2022-03-15 MED ORDER — DEXCOM G7 SENSOR MISC: 1 refills | Status: DC

## 2022-03-16 ENCOUNTER — Other Ambulatory Visit (HOSPITAL_COMMUNITY): Payer: Self-pay

## 2022-03-17 ENCOUNTER — Telehealth: Payer: Self-pay

## 2022-03-17 ENCOUNTER — Other Ambulatory Visit (HOSPITAL_COMMUNITY): Payer: Self-pay

## 2022-03-17 NOTE — Telephone Encounter (Signed)
Pharmacy Patient Advocate Encounter  Received notification of PA needed for Dexcom G7   Per Test Claim: Dexcom G6 was filled through insurance at [place] with $0.00 copay  Please advise if CGM has changed and PA is still needed.

## 2022-03-18 ENCOUNTER — Encounter: Payer: Self-pay | Admitting: Podiatry

## 2022-03-18 ENCOUNTER — Ambulatory Visit (INDEPENDENT_AMBULATORY_CARE_PROVIDER_SITE_OTHER): Payer: Commercial Managed Care - HMO | Admitting: Podiatry

## 2022-03-18 DIAGNOSIS — E0843 Diabetes mellitus due to underlying condition with diabetic autonomic (poly)neuropathy: Secondary | ICD-10-CM | POA: Diagnosis not present

## 2022-03-18 DIAGNOSIS — L97512 Non-pressure chronic ulcer of other part of right foot with fat layer exposed: Secondary | ICD-10-CM | POA: Diagnosis not present

## 2022-03-20 DIAGNOSIS — R251 Tremor, unspecified: Secondary | ICD-10-CM | POA: Insufficient documentation

## 2022-03-24 ENCOUNTER — Ambulatory Visit: Payer: Commercial Managed Care - HMO | Attending: Cardiovascular Disease | Admitting: *Deleted

## 2022-03-24 DIAGNOSIS — Z7901 Long term (current) use of anticoagulants: Secondary | ICD-10-CM | POA: Diagnosis not present

## 2022-03-24 DIAGNOSIS — I4892 Unspecified atrial flutter: Secondary | ICD-10-CM | POA: Diagnosis not present

## 2022-03-24 DIAGNOSIS — I480 Paroxysmal atrial fibrillation: Secondary | ICD-10-CM

## 2022-03-24 LAB — POCT INR: INR: 2.2 (ref 2.0–3.0)

## 2022-03-24 NOTE — Patient Instructions (Addendum)
Description   Continue taking warfarin 1/2 tablet daily except for 1 tablet every Mondays and Fridays.  Recheck INR in 4 weeks. Anticoagulation Clinic [phone]

## 2022-03-26 ENCOUNTER — Other Ambulatory Visit: Payer: Self-pay | Admitting: Physician Assistant

## 2022-03-26 NOTE — Progress Notes (Signed)
Chief Complaint  Patient presents with   Wound Check    "It's doing okay.  My dog has nipped the other toe now too."    Subjective:  66 y.o. male with PMHx of diabetes mellitus presenting to the office today for follow-up evaluation of ulcers that have developed of the right great toe.  Patient states that his dogs have been chewing on his toes.  Patient has completed oral antibiotics prescribed by his PCP.  Overall he believes there is some modest improvement.  Past Medical History:  Diagnosis Date   Arthritis    lower back   Asthma    Atrial fibrillation [facility])    Atrial flutter [facility])    s/p CTI ablation by Dr Rayann Heman   Brain aneurysm 2009   questionable. A follow up CTA in 2009 showed no evidence of   Chronic back pain    DDD/stenosis   Colon polyps    9 polyps removed 3/71/69   Complication of anesthesia 09/11/2012   slow to awaken after ablation   COPD (chronic obstructive pulmonary disease) ([place])    Diabetes mellitus    takes Metformin and Glimepiride daily   Emphysema    GERD (gastroesophageal reflux disease)    takes Omeprazole bid   Heart failure ([place])    History of shingles    HLD (hyperlipidemia)    takes Pravastatin daily   HTN (hypertension)    takes Prinizide daily   Obesity    OSA (obstructive sleep apnea)    not always using cpap   Overdose 2009   unintentional Flecanide overdose   Peripheral neuropathy    Short-term memory loss    Tobacco abuse     Past Surgical History:  Procedure Laterality Date   ANTERIOR CERVICAL DECOMP/DISCECTOMY FUSION  01/04/2012   Procedure: ANTERIOR CERVICAL DECOMPRESSION/DISCECTOMY FUSION 1 LEVEL/HARDWARE REMOVAL;  Surgeon: Ophelia Charter, MD;  Location: [place] NEURO ORS;  Service: Neurosurgery;  Laterality: N/A;  explore cervical fusion Cervical six - seven  with removal of codman plate anterior cervical decompression with fusion interbody prothesis plating and bonegraft   APPENDECTOMY  2-42yr ago   [place] 09/11/2012   PT DID NOT HAVE AN ATRIAL FIBRILLATION ABLATION IN 2014!  ATRIAL FLUTTER ABLATION ONLY   ATRIAL FLUTTER ABLATION  09/11/2012   CTI ablation by Dr ARayann Heman  BIOPSY  11/07/2019   Procedure: BIOPSY;  Surgeon: JMilus Banister MD;  [facility];  Service: Endoscopy;;   CARDIAC CATHETERIZATION  2008   no significant CAD   CARDIOVERSION  05/05/2011   Procedure: CARDIOVERSION;  Surgeon: BLelon Perla MD;  [place]  Service: Cardiovascular;  Laterality: N/A;   CARDIOVERSION Bilateral 07/26/2012   Procedure: CARDIOVERSION;  Surgeon: JMinus Breeding MD;  [facility];  Service: Cardiovascular;  Laterality: Bilateral;   CARPAL TUNNEL RELEASE  99/2000   bilateral   COLONOSCOPY WITH PROPOFOL N/A 12/13/2012   Procedure: COLONOSCOPY WITH PROPOFOL;  Surgeon: DMilus Banister MD;  [facility];  Service: Endoscopy;  Laterality: N/A;   ELECTROPHYSIOLOGIC STUDY N/A 04/21/2015   Procedure: Atrial Fibrillation Ablation;  Surgeon: JThompson Grayer MD;  [place] [facility];  Service: Cardiovascular;  Laterality: N/A;   ESOPHAGOGASTRODUODENOSCOPY (EGD) WITH PROPOFOL N/A 11/07/2019   Procedure: ESOPHAGOGASTRODUODENOSCOPY (EGD) WITH PROPOFOL;  Surgeon: JMilus Banister MD;  [facility];  Service: Endoscopy;  Laterality: N/A;   HERNIA REPAIR     KNEE ARTHROSCOPY WITH MEDIAL MENISECTOMY Left 04/23/2021  Procedure: LEFT KNEE ARTHROSCOPY WITH PARTIAL MEDIAL MENISCECTOMY;  Surgeon: [name], MD;  Location: [place];  Service: Orthopedics;  Laterality: Left;   KNEE SURGERY  6-34yr ago   left   LEFT HEART CATH AND CORONARY ANGIOGRAPHY N/A 09/19/2016   Procedure: Left Heart Cath and Coronary Angiography;  Surgeon: SBelva Crome MD;  [place];  Service: Cardiovascular;  Laterality: N/A;   Left inguinal hernia repair     as a child   NASAL SEPTOPLASTY W/ TURBINOPLASTY Bilateral 02/19/2014   Procedure: NASAL  SEPTOPLASTY WITH BILATERAL TURBINATE REDUCTION;  Surgeon: KJodi Marble MD;  [place]  Service: ENT;  Laterality: Bilateral;   NECK SURGERY  [place]   right shoulder surgery  [id number]   cyst removed   TEE WITHOUT CARDIOVERSION  05/05/2011   Procedure: TRANSESOPHAGEAL ECHOCARDIOGRAM (TEE);  Surgeon: BriLelon PerlaD;  [facility];  Service: Cardiovascular;  Laterality: N/A;   TEE WITHOUT CARDIOVERSION N/A 04/21/2015   Procedure: TRANSESOPHAGEAL ECHOCARDIOGRAM (TEE);  Surgeon: TraSueanne MargaritaD;  [facility];  Service: Cardiovascular;  Laterality: N/A;   THROAT SURGERY  [id number]   "thought " it was cancer but came back not   TONSILLECTOMY      Allergies  Allergen Reactions   Adhesive [Tape]     itching   Latex Itching    When tape is on the skin too long skin gets red & itching      Pictures taken 02/22/2022   Objective/Physical Exam General: The patient is alert and oriented x3 in no acute distress.  Dermatology:  Mostly unchanged.  Ulcers noted to the dorsal medial and plantar medial aspect of the right great toe each measuring approximately 1.5 x 0.8 x 0.2 cm (LxWxD)  To the noted ulceration(s), there is no eschar. There is a moderate amount of slough, fibrin, and necrotic tissue noted. Granulation tissue and wound base is red. There is a minimal amount of serosanguineous drainage noted. There is no exposed bone muscle-tendon ligament or joint. There is no malodor. Periwound integrity is intact. Skin is warm, dry and supple bilateral lower extremities.  Vascular: VAS US [place] W/WO TBI 12/23/2021 ABI Findings:  +---------+------------------+-----+---------+--------+  Right   Rt Pressure (mmHg)IndexWaveform Comment   +---------+------------------+-----+---------+--------+  Brachial 127                                       +---------+------------------+-----+---------+--------+  PTA     197               1.47 triphasic           +---------+------------------+-----+---------+--------+  DP      [place]                     triphasic          +---------+------------------+-----+---------+--------+  Great Toe90                0.67                    +---------+------------------+-----+---------+--------+   +---------+------------------+-----+---------+-------+  Left    Lt Pressure (mmHg)IndexWaveform Comment  +---------+------------------+-----+---------+-------+  Brachial 134                                      +---------+------------------+-----+---------+-------+  PTA     180               1.34 triphasic         +---------+------------------+-----+---------+-------+  DP      168               1.25 triphasic         +---------+------------------+-----+---------+-------+  Great Toe82                0.61                   +---------+------------------+-----+---------+-------+   +-------+-----------+-----------+------------+------------+  ABI/TBIToday's ABIToday's TBIPrevious ABIPrevious TBI  +-------+-----------+-----------+------------+------------+  Right [place]         0.67       [place]          1.08          +-------+-----------+-----------+------------+------------+  Left  [place]         0.61       1.26        0.77          +-------+-----------+-----------+------------+------------+  Arterial wall calcification precludes accurate ankle pressures and ABIs.    Summary:  Right: Resting right ankle-brachial index indicates noncompressible right  lower extremity arteries. The right toe-brachial index is abnormal.   Left: Resting left ankle-brachial index indicates noncompressible left  lower extremity arteries. The left toe-brachial index is abnormal.   Neurological: Light touch and protective threshold diminished bilaterally.   Musculoskeletal Exam: No prior amputations  Assessment: 1.  Ulcers right great toe secondary to diabetes mellitus 2. diabetes  mellitus w/ peripheral neuropathy   Plan of Care:  1. Patient was evaluated. 2. medically necessary excisional debridement including subcutaneous tissue was performed using a tissue nipper and a chisel blade. Excisional debridement of all the necrotic nonviable tissue down to healthy bleeding viable tissue was performed with post-debridement measurements same as pre-. 3. the wound was cleansed and dry sterile dressing applied. 4.  Continue topical antibiotic ointment with a dressing.  Stressed the importance of restraining his dogs to lick and chew on his toes  5.  Patient is to return to clinic in 2 weeks.   Edrick Kins, DPM [facility]  Dr. Edrick Kins, DPM    [address],  [address]                Office [phone]  Fax [phone]

## 2022-03-28 NOTE — Telephone Encounter (Signed)
Patient is requesting omeprazole. Please advise.

## 2022-03-29 NOTE — Progress Notes (Unsigned)
Initial neurology clinic note  SERVICE DATE: 03/31/22  Reason for Evaluation: Consultation requested by Alfredia Ferguson, PA-C for an opinion regarding muscle wasting and tremors. My final recommendations will be communicated back to the requesting physician by way of shared medical record or letter to requesting physician via [place] mail.  HPI: This is Mr. [name], a 66 y.o. 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 The patient is alone today.  Patient started noticing symptoms 2-2.5 years ago. He relates that his symptoms started having muscle loss, weakness, and tremors. He states prior to this, he was strong. He still tries to stay active, but his muscle loss has slowed him down. He has tingling and numbness in bilateral legs and hands.  He uses a cane to ambulate mostly, but sometimes also uses a walker. He falls often. He mentions that when he is just standing, he tends to get off balance. He falls about once per month. If he is walking, he does not have problems. If he tries to look up or down or turns, that is most likely time to fall. He does occasionally freeze when walking.  Patient mentions he has had a tremor in his left hand since 1998 after his cervical spine fusion. He mentions now that he has tremors all over. His body will jump and almost throw him out of a chair. He mentions that when he is more nervous his tremors worsen.  He relates a long history of neck and back problems. He mentions lower back pain since the age of 25. He also states his cervical spine is "worn out."   The patient denies symptoms suggestive of oculobulbar weakness including diplopia, ptosis, poor saliva control,  dysarthria/dysphonia, impaired mastication, facial weakness/droop.   He does sometimes have difficulty getting food down and things will get stuck. Sometimes he has to swallow liquids twice. Per patient, he has something growing in his neck. I do not see notes about this.  Patient has COPD and chronic shortness of breath and has had difficulty laying on his back due to shortness of breath for about 25 years.  Pseudobulbar affect is absent.  Patient has lost 15-20 pounds over the last 2 years without trying. He has put some weight back on lately.  Patient takes gabapentin 900 mg BID for neuropathy. The pain in his legs can be very severe, especially at night.  Of note, patient is on warfarin for afib.  Patient was on pravastatin but stopped recently due to weakness. He has been off of it for 1-2 weeks. He thinks it may have helped, but is not sure.  Patient was sent to PT. He went once in the summer of 2023. He stopped due to pain.  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 Patient thinks his tremor would go away years ago after drinking. Restrictive diet? No Family history of neuropathy/myopathy/NM disease? Son has tremors   MEDICATIONS:  Outpatient Encounter Medications as of 03/31/2022  Medication Sig   albuterol (PROAIR HFA) 108 (90 Base) MCG/ACT inhaler Inhale 1-2 puffs into the lungs every 6 (six) hours as needed for wheezing or shortness of breath.   albuterol (PROVENTIL) (2.5 MG/3ML) 0.083% nebulizer solution Take 3  mLs (2.5 mg total) by nebulization every 6 (six) hours as needed for wheezing or shortness of breath.   b complex vitamins tablet Take 1 tablet by mouth daily.    bisoprolol (ZEBETA) 10 MG tablet Take 10 mg by mouth daily.    colchicine 0.6 MG tablet TAKE 2 TABLETS BY MOUTH ONCE. THEN TAKE 1 TABLET 1 HOUR LATER. WAIT 2 DAYS, THEN BEGIN TAKING 1 TABLET DAILY TO PREVENT ACUTE GOUT.   Continuous Blood Gluc Receiver (DEXCOM G6 RECEIVER)  DEVI    Continuous Blood Gluc Receiver (DEXCOM G7 RECEIVER) DEVI Use as instructed to check blood sugar   Continuous Blood Gluc Sensor (DEXCOM G7 SENSOR) MISC Use as instructed to check blood sugar   Continuous Blood Gluc Transmit (DEXCOM G6 TRANSMITTER) MISC USE AS DIRECTED   cyclobenzaprine (FLEXERIL) 10 MG tablet Take 10 mg by mouth 3 (three) times daily.   diltiazem (CARDIZEM CD) 120 MG 24 hr capsule Take 1 capsule (120 mg total) by mouth daily.   Dulaglutide (TRULICITY) 3 MB/8.4YK SOPN Inject 3 mg as directed once a week.   fluticasone-salmeterol (ADVAIR) 100-50 MCG/ACT AEPB Inhale 1 puff into the lungs 2 (two) times daily.   gabapentin (NEURONTIN) 300 MG capsule TAKE 3 CAPSULES BY MOUTH TWICE DAILY FOR NEUROPATHY   Glucagon 3 MG/DOSE POWD Place 3 mg into the nose once as needed for up to 1 dose.   insulin aspart (NOVOLOG FLEXPEN) 100 UNIT/ML FlexPen Inject 40-60 Units into the skin 3 (three) times daily with meals.   Insulin Glargine (BASAGLAR KWIKPEN) 100 UNIT/ML Inject 70 Units into the skin daily.   Insulin Pen Needle (EASY COMFORT PEN NEEDLES) 31G X 5 MM MISC To use with as needed insulin dosing up to four times daily   ketoconazole (NIZORAL) 2 % cream Apply 1 Application topically daily.   losartan (COZAAR) 50 MG tablet TAKE 1 TABLET BY MOUTH DAILY   metolazone (ZAROXOLYN) 2.5 MG tablet TAKE 1 TABLET BY MOUTH ON TUESDAY AND THURSDAY 30 MINUTES BEFORE TAKING TORSEMIDE OR AS DIRECTED   mupirocin ointment (BACTROBAN) 2 % Ply small amount to effected area twice daily for 10 days.   nitroGLYCERIN (NITROSTAT) 0.4 MG SL tablet Place 1 tablet (0.4 mg total) under the tongue every 5 (five) minutes x 3 doses as needed for chest pain.   omeprazole (PRILOSEC) 20 MG capsule TAKE 1 CAPSULE BY MOUTH TWICE DAILY   oxyCODONE-acetaminophen (PERCOCET) 10-325 MG tablet Take 1.5 tablets by mouth in the morning, at noon, in the evening, and at bedtime.   potassium chloride SA (KLOR-CON M) 20 MEQ tablet TAKE  1 TABLET BY MOUTH DAILY. TAKE AN EXTRA TABLET ON TUESDAY AND THURSDAY   pravastatin (PRAVACHOL) 80 MG tablet TAKE 1 TABLET BY MOUTH DAILY AT BEDTIME (Patient taking differently: On a 3 week break)   tamsulosin (FLOMAX) 0.4 MG CAPS capsule TAKE 1 CAPSULE BY MOUTH EACH NIGHT AT BEDTIME   torsemide (DEMADEX) 20 MG tablet Take 2 tablets in the morning and 1 tablet in the PM   triamcinolone cream (KENALOG) 0.1 % Apply 1 Application topically daily as needed.   umeclidinium bromide (INCRUSE ELLIPTA) 62.5 MCG/ACT AEPB Inhale 1 puff into the lungs daily.   VITAMIN A PO Take 2,400 mcg by mouth daily.    Vitamin D, Ergocalciferol, (DRISDOL) 1.25 MG (50000 UNIT) CAPS capsule TAKE 1 CAPSULE BY MOUTH EVERY 7 DAYS   warfarin (COUMADIN) 5 MG tablet Take 1/2 to 1 tablet daily or as directed   sulfamethoxazole-trimethoprim (BACTRIM  DS) 800-160 MG tablet Take 1 tablet by mouth 2 (two) times daily. (Patient not taking: Reported on 03/31/2022)   No facility-administered encounter medications on file as of 03/31/2022.    PAST MEDICAL HISTORY: Past Medical History:  Diagnosis Date   Arthritis    lower back   Asthma    Atrial fibrillation [facility])    Atrial flutter [facility])    s/p CTI ablation by Dr Rayann Heman   Brain aneurysm 2009   questionable. A follow up CTA in 2009 showed no evidence of   Chronic back pain    DDD/stenosis   Colon polyps    9 polyps removed 1/61/09   Complication of anesthesia 09/11/2012   slow to awaken after ablation   COPD (chronic obstructive pulmonary disease) ([place])    Diabetes mellitus    takes Metformin and Glimepiride daily   Emphysema    GERD (gastroesophageal reflux disease)    takes Omeprazole bid   Heart failure ([place])    History of shingles    HLD (hyperlipidemia)    takes Pravastatin daily   HTN (hypertension)    takes Prinizide daily   Obesity    OSA (obstructive sleep apnea)    not always using cpap   Overdose 2009   unintentional Flecanide overdose   Peripheral  neuropathy    Short-term memory loss    Tobacco abuse     PAST SURGICAL HISTORY: Past Surgical History:  Procedure Laterality Date   ANTERIOR CERVICAL DECOMP/DISCECTOMY FUSION  01/04/2012   Procedure: ANTERIOR CERVICAL DECOMPRESSION/DISCECTOMY FUSION 1 LEVEL/HARDWARE REMOVAL;  Surgeon: Ophelia Charter, MD;  Location: [place] [facility];  Service: Neurosurgery;  Laterality: N/A;  explore cervical fusion Cervical six - seven  with removal of codman plate anterior cervical decompression with fusion interbody prothesis plating and bonegraft   [place]  2[place]41yr [place]   PT DID NOT HAVE AN ATRIAL FIBRILLATION ABLATION IN 2014!  ATRIAL FLUTTER ABLATION ONLY   ATRIAL FLUTTER ABLATION  09/11/2012   CTI ablation by Dr ARayann Heman  BIOPSY  11/07/2019   Procedure: BIOPSY;  Surgeon: JMilus Banister MD;  [facility]: [facility];  Service: Endoscopy;;   CARDIAC CATHETERIZATION  2008   no significant CAD   CARDIOVERSION  05/05/2011   Procedure: CARDIOVERSION;  Surgeon: BLelon Perla MD;  [place]  Service: Cardiovascular;  Laterality: N/A;   CARDIOVERSION Bilateral 07/26/2012   Procedure: CARDIOVERSION;  Surgeon: JMinus Breeding MD;  [facility];  Service: Cardiovascular;  Laterality: Bilateral;   CARPAL TUNNEL RELEASE  99/2000   bilateral   COLONOSCOPY WITH PROPOFOL N/A 12/13/2012   Procedure: COLONOSCOPY WITH PROPOFOL;  Surgeon: DMilus Banister MD;  [facility]: [facility];  Service: Endoscopy;  Laterality: N/A;   ELECTROPHYSIOLOGIC STUDY N/A 04/21/2015   Procedure: Atrial Fibrillation Ablation;  Surgeon: JThompson Grayer MD;  [place] [facility];  Service: Cardiovascular;  Laterality: N/A;   ESOPHAGOGASTRODUODENOSCOPY (EGD) WITH PROPOFOL N/A 11/07/2019   Procedure: ESOPHAGOGASTRODUODENOSCOPY (EGD) WITH PROPOFOL;  Surgeon: JMilus Banister MD;  [facility];  Service: Endoscopy;  Laterality: N/A;   HERNIA REPAIR     KNEE  ARTHROSCOPY WITH MEDIAL MENISECTOMY Left 04/23/2021   Procedure: LEFT KNEE ARTHROSCOPY WITH PARTIAL MEDIAL MENISCECTOMY;  Surgeon: YMarybelle Killings MD;  [place]  Service: Orthopedics;  Laterality: Left;   KNEE SURGERY  [id number]   left   LEFT HEART CATH AND CORONARY ANGIOGRAPHY N/A 09/19/2016   Procedure: Left  Heart Cath and Coronary Angiography;  Surgeon: Belva Crome, MD;  Location: [place] [facility];  Service: Cardiovascular;  Laterality: N/A;   Left inguinal hernia repair     as a child   NASAL SEPTOPLASTY W/ TURBINOPLASTY Bilateral 02/19/2014   Procedure: NASAL SEPTOPLASTY WITH BILATERAL TURBINATE REDUCTION;  Surgeon: Jodi Marble, MD;  Location: [place];  Service: ENT;  Laterality: Bilateral;   NECK SURGERY  54yr ago   right shoulder surgery  [id number]   cyst removed   TEE WITHOUT CARDIOVERSION  05/05/2011   Procedure: TRANSESOPHAGEAL ECHOCARDIOGRAM (TEE);  Surgeon: BrLelon PerlaMD;  [facility];  Service: Cardiovascular;  Laterality: N/A;   TEE WITHOUT CARDIOVERSION N/A 04/21/2015   Procedure: TRANSESOPHAGEAL ECHOCARDIOGRAM (TEE);  Surgeon: TrSueanne MargaritaMD;  [facility];  Service: Cardiovascular;  Laterality: N/A;   THROAT SURGERY  [id number]   "thought " it was cancer but came back not   TONSILLECTOMY      ALLERGIES: Allergies  Allergen Reactions   Adhesive [Tape]     itching   Latex Itching    When tape is on the skin too long skin gets red & itching    FAMILY HISTORY: Family History  Problem Relation Age of Onset   Diabetes Mother    Heart disease Father    Lung cancer Father    Diabetes Maternal Grandmother    Colon cancer Neg Hx    Anesthesia problems Neg Hx    Hypotension Neg Hx    Malignant hyperthermia Neg Hx    Pseudochol deficiency Neg Hx    Esophageal cancer Neg Hx    Pancreatic cancer Neg Hx    Stomach cancer Neg Hx     SOCIAL HISTORY: Social History   Tobacco Use   Smoking status: Every Day    Packs/day:  1.00    Years: 49.00    Total pack years: 49.00    Types: Cigarettes   Smokeless tobacco: Never   Tobacco comments:    Smoking almost a pack per day 03/08/2022 hfb  Vaping Use   Vaping Use: Never used  Substance Use Topics   Alcohol use: Not Currently    Alcohol/week: 0.0 standard drinks of alcohol    Comment: 12/05/17 pt stated he has drinked 1/2 drink in 3 years   Drug use: No   Social History   Social History Narrative   Daily caffeine(Mountain Dew) [place].Unemployed due to chronic back/ leg pain         Are you right handed or left handed? Left Handed    Are you currently employed ? No    What is your current occupation? No   Do you live at home alone? no   Who lives with you? Lives with wife and 3 doggies   What type of home do you live in: 1 story or 2 story? Lives in one story home         OBJECTIVE: PHYSICAL EXAM: BP 104/60   Pulse 77   Ht _0  (1.88 m)   Wt 247 lb (112 kg)   SpO2 95%   BMI 31.71 kg/m   General: General appearance: Awake and alert. No distress. Cooperative with exam.  Skin: No obvious rash or jaundice. HEENT: Atraumatic. Anicteric. Very limited range of motion in neck Lungs: Conversational dyspnea Heart: Regular Psych: Affect appropriate.  Neurological: Mental Status: Alert. Speech fluent. No pseudobulbar affect Cranial Nerves: CNII: No RAPD. Visual  fields grossly intact. CNIII, IV, VI: PERRL. No nystagmus. EOMI. CN V: Facial sensation intact bilaterally to fine touch. [place] clench strong. Jaw jerk negative. CN VII: Facial muscles symmetric and strong. No ptosis at rest CN VIII: Hearing grossly intact bilaterally. CN IX: No hypophonia. CN X: Palate elevates symmetrically. CN XI: Full strength shoulder shrug bilaterally. CN XII: Tongue protrusion full and midline. No atrophy or fasciculations. No significant dysarthria Motor: Tone is mildly increased. Tremor at rest of bilateral arms, chin. No  obvious fasciculations in extremities. No clear atrophy.   Individual muscle group testing (MRC grade out of 5):  Movement     Neck flexion 5    Neck extension 5     Right Left Pain throughout testing  Shoulder abduction 5 5   Elbow flexion 5 5   Elbow extension 5 5   Finger abduction - FDI 4+ 4+   Finger abduction - ADM 4+ 4+   Finger extension 5 5   Finger distal flexion - 2/_0 Finger distal flexion - 4/_1 Thumb flexion - FPL 4+ 4+   Thumb abduction - APB 4+ 4+    Hip flexion 3 3 Limited by pain? Patient able to raise legs to take off shoes.  Hip extension 5 5   Hip adduction 5 5   Hip abduction 5 5   Knee extension 5 5   Knee flexion 5 5   Dorsiflexion 5 5   Plantarflexion 5 5     Reflexes:  Right Left   Bicep Trace Trace   Tricep 0 0   BrRad 0 0   Knee 0 0   Ankle 0 0    Pathological Reflexes: Babinski: flexor response bilaterally Hoffman: absent bilaterally Troemner: absent bilaterally Facial: absent bilaterally Midline tap: absent Sensation: Pinprick: Absent in bilateral lower extremities to knees Vibration: Absent in feet, diminished at hands and knees Proprioception: Absent in bilateral great toes Coordination: Intact finger-to- nose-finger bilaterally (significant tremor with action) Gait: Unable to rise from chair with arms crossed unassisted. Stooped posture, antalgic gait.  Lab and Test Review: Internal labs: Normal or unremarkable: CRP, B12 (841), folate, RF, TSH ANA negative on 08/12/21, positive on 10/22/20, negative on 12/26/17 INR (03/24/22): 2.2 CBC (03/01/22): significant for anemia (Hb 11.2), thrombocytopenia (141) CMP (03/01/22): significant for elevated glucose, elevated alk phos (121), Cr 1.5 ESR (03/01/22): elevated to 67 HbA1c (11/30/21): 7.7  EMG (08/25/20, Dr. Ernestina Patches): EMG & NCV Findings: Evaluation of the left fibular motor nerve showed prolonged distal onset latency (12.2 ms) and reduced amplitude (1.9 mV).  The left  tibial motor nerve showed prolonged distal onset latency (6.2 ms), reduced amplitude (0.6 mV), and decreased conduction velocity (Knee-Ankle, 31 m/s).  The left saphenous sensory nerve showed prolonged distal peak latency (8.1 ms).  The left superficial fibular sensory nerve showed no response (14 cm).  The left sural sensory nerve showed no response (Calf).     Needle evaluation of the left anterior tibialis and the left Fibularis Longus muscles showed increased insertional activity, increased spontaneous activity, and diminished recruitment.  The left medial gastrocnemius muscle showed increased insertional activity, moderately increased spontaneous activity, and diminished recruitment.  All remaining muscles (as indicated in the following table) showed no evidence of electrical instability.     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  He carries a  diagnosis of polyneuropathy but has not had electrodiagnostic studies.  For proven full characterization of the neuropathy referral to a neurologist for a full 3 limb study could be performed.  The needle EMG findings could be consistent with a lumbar stenosis but typically the level of denervation seen in motor unit changes would be a fairly severe stenosis and his latest imaging shows moderate narrowing.  He could be getting more pain from the spine on the left more than right but that is not confirmed or elucidated with this testing.   There is no significant electrodiagnostic evidence of any other focal nerve entrapment or lumbosacral plexopathy.   MRI lumbar spine wo contrast (03/01/20): FINDINGS: Segmentation:  Standard.   Alignment:  Straightening of lordosis.  Grade 1 L3-4 retrolisthesis.   Vertebrae: Multilevel Modic type 2 endplate degenerative changes. Multilevel Schmorl's node  formation. Scattered hemangiomata versus focal fat.   Conus medullaris and cauda equina: Conus extends to the L1 level. Conus and cauda equina appear normal.   Disc levels: Multilevel desiccation disc space loss most prominent at the L3-4 and L5-S1 levels.   L1-2: Disc bulge with small central protrusion, ligamentum flavum and bilateral facet hypertrophy. Mild spinal canal and bilateral neural foraminal narrowing.   L2-3: Disc bulge, ligamentum flavum and bilateral facet hypertrophy. Mild spinal canal, moderate right and mild left neural foraminal narrowing.   L3-4: Disc bulge abutting the ventral thecal sac with prominent subarticular components effacing the lateral recess. There is abutment of the exiting L3 and descending L4 nerve roots. Bilateral facet hypertrophy. Moderate spinal canal and bilateral neural foraminal narrowing, unchanged.   L4-5: Disc bulge and bilateral facet hypertrophy. Small left foraminal protrusion grazing the exiting L4 nerve root. Patent spinal canal. Moderate bilateral neural foraminal narrowing.   L5-S1: Bilateral pars defects. Disc bulge with superimposed central protrusion/annular fissuring. Bilateral facet hypertrophy. Patent spinal canal. Mild bilateral neural foraminal narrowing.   Paraspinal and other soft tissues: Bilateral renal cysts.   IMPRESSION: Multilevel spondylosis, grossly unchanged.   Moderate L3-4 and mild L1-3 spinal canal narrowing.   Moderate right L2-3 and bilateral L3-5 neural foraminal narrowing.   Mild bilateral L1-2, L5-S1 neural foraminal narrowing.  MRI cervical spine (10/31/11): Findings: Scan extends from the mid clivus through T1-2. Normal  paraspinal soft tissues. Visualized intracranial contents are  normal.   The patient has a solid anterior cervical fusion from C4-C6.  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  The cervical spinal  cord  is otherwise normal.   C1-2 and C2-3:  Normal.   C3-4:  Small slightly more prominent broad-based disc bulge with  prominent uncinate spurring to the right and left with bilateral  foraminal narrowing, unchanged.   C4-5 and C5-6:  Solid anterior fusion with no residual impingement.  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   C6-7:  Large new soft disc protrusion and extrusion into the left  lateral recess and left neural foramen affecting the left C7 nerve.  The extruded material measures 12 x 11 x 9 mm.  It touches the left  side of the spinal cord in the left lateral recess but does not  compress it.   C7-T1 and T1-2:  Normal.   IMPRESSION:  1. Large soft disc extrusion and protrusion into the left neural  foramen and left lateral recess at C6-7 compressing the left C7  nerve.  2. 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  3.  Slight increased broad-based disc bulge at C3-4 without focal  neural impingement.   ASSESSMENT: [name] is a 66 y.o. male who presents for evaluation of tremor, muscle atrophy, and muscle weakness. He has a complex medical history of cervical stenosis s/p fusion (does not know the levels), lumbar stenosis, HTN, CAD, COPD, OSA (CPAP intolerant), DM2, afib s/p ablation in 2016, cirrhosis 2/2 EtOH, OA, smoker. His neurological examination is pertinent for distal weakness in bilateral upper extremities and weakness of hip flexion in lower extremities. He has diffuse sensory deficits, most pronounced in lower extremities and hyperreflexia. He also has a tremor at rest in hands (left > right) that worsens with movement. 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 HbA1c was 7.7.  This is a complex case. Patient's symptoms are likely multifactorial. Tremor - Likely essential tremor. His son  also has similar symptoms (often runs in families). He also remembers the tremor would go away after he would drink (when he previously drank heavily). There is no clear parkinsonism to suggest a neurodegenerative process. Numbness, tingling, weakness, ?atrophy - Patient has known severe polyneuropathy, with known risk factors of previous heavy EtOH use and diabetes. He also has known severe cervical and more moderate lumbar spine disease. This is likely contributing to symptoms as well. A more sinister process such as motor neuron disease is possible with symptoms of weakness and atrophy, but I did not see significant atrophy on exam. Fasciculations are difficult to assess for in this patient as his tremors were significant, but there were no clear fasciculations today. Myopathy is also possible, particularly given his possible proximal leg weakness and previous statin use.   Under normal circumstances, I would complete an EMG to evaluate for these possibilities. However, with known severe neuropathy, spine disease, and anticoagulation use, I am not sure I would be able to extensively test as needed to get meaningful results. I will start with lab work and repeat MRI of cervical spine and monitor symptoms. If patient is worsening over the coming months, I will reconsider EMG. We discussed therapy, but patient feels he is in too much pain to complete currently.  PLAN: -Blood work: B1, CK, IFE, SPEP -MRI cervical spine wo contrast (open machine requested) -Start Primidone 25 mg for 1 week, then 50 mg daily for 1 week, then 50 mg BID  -Return to clinic in 3 months  The impression above as well as the plan as outlined below were extensively discussed with the patient who voiced understanding. All questions were answered to their satisfaction.  The patient was counseled on pertinent fall precautions per the printed material provided today, and as noted under the "Patient Instructions" section  below.  When available, results of the above investigations and possible further recommendations will be communicated to the patient via telephone/MyChart. Patient to call office if not contacted after expected testing turnaround time.   Total time spent reviewing records, interview, history/exam, documentation, and coordination of care on day of encounter:  75 min   Thank you for allowing me to participate in patient's care.  If I can answer any additional questions, I would be pleased to do so.  Ryan Levins, MD   CC: Ronnell Freshwater, NP [place] [id number]  CC: Referring provider: Trellis Paganini, Warren Lacy  [address] PA-C [place],  [place] [address]

## 2022-03-31 ENCOUNTER — Encounter: Payer: Self-pay | Admitting: Neurology

## 2022-03-31 ENCOUNTER — Ambulatory Visit: Payer: Commercial Managed Care - HMO | Admitting: Neurology

## 2022-03-31 ENCOUNTER — Other Ambulatory Visit (INDEPENDENT_AMBULATORY_CARE_PROVIDER_SITE_OTHER): Payer: Commercial Managed Care - HMO

## 2022-03-31 VITALS — BP 104/60 | HR 77 | Ht 74.0 in | Wt 247.0 lb

## 2022-03-31 DIAGNOSIS — M48062 Spinal stenosis, lumbar region with neurogenic claudication: Secondary | ICD-10-CM

## 2022-03-31 DIAGNOSIS — G250 Essential tremor: Secondary | ICD-10-CM

## 2022-03-31 DIAGNOSIS — M4802 Spinal stenosis, cervical region: Secondary | ICD-10-CM

## 2022-03-31 DIAGNOSIS — M6259 Muscle wasting and atrophy, not elsewhere classified, multiple sites: Secondary | ICD-10-CM

## 2022-03-31 DIAGNOSIS — R251 Tremor, unspecified: Secondary | ICD-10-CM

## 2022-03-31 LAB — CK: Total CK: 31 U/L (ref 7–232)

## 2022-03-31 MED ORDER — PRIMIDONE 50 MG PO TABS: 50.0000 mg | ORAL_TABLET | Freq: Two times a day (BID) | ORAL | 3 refills | Status: DC

## 2022-03-31 NOTE — Patient Instructions (Addendum)
I saw you today for tremors, muscle weakness, and muscle loss. I am not sure the cause of your symptoms currently. This could be related to your spine disease, but I want to look for other causes.  I want to do lab work today.  I want to repeat your MRI of your neck (cervical spine).   I will be in touch when I have your results.  We will start a medication for tremors today called Primidone. You will take it as follows: Take 1/2 tablet (25 mg) for 1 week, then take 1 tablet (50 mg) for 1 week, then take 1 tablet (50 mg) twice daily (morning and night) thereafter.  I want to see you back in clinic in 3 months.  The physicians and staff at [facility] are committed to providing excellent care. You may receive a survey requesting feedback about your experience at our office. We strive to receive "very good" responses to the survey questions. If you feel that your experience would prevent you from giving the office a "very good " response, please contact our office to try to remedy the situation. We may be reached at [phone]. Thank you for taking the time out of your busy day to complete the survey.  Kai Levins, MD [facility]  Preventing Falls at [facility] are common, often dreaded events in the lives of older people. Aside from the obvious injuries and even death that may result, fall can cause wide-ranging consequences including loss of independence, mental decline, decreased activity and mobility. Younger people are also at risk of falling, especially those with chronic illnesses and fatigue.  Ways to reduce risk for falling Examine diet and medications. Warm foods and alcohol dilate blood vessels, which can lead to dizziness when standing. Sleep aids, antidepressants and pain medications can also increase the likelihood of a fall.  Get a vision exam. Poor vision, cataracts and glaucoma increase the chances of falling.  Check foot gear. Shoes should fit snugly and have  a sturdy, nonskid sole and a broad, low heel  Participate in a physician-approved exercise program to build and maintain muscle strength and improve balance and coordination. Programs that use ankle weights or stretch bands are excellent for muscle-strengthening. Water aerobics programs and low-impact Tai Chi programs have also been shown to improve balance and coordination.  Increase vitamin D intake. Vitamin D improves muscle strength and increases the amount of calcium the body is able to absorb and deposit in bones.  How to prevent falls from common hazards Floors - Remove all loose wires, cords, and throw rugs. Minimize clutter. Make sure rugs are anchored and smooth. Keep furniture in its usual place.  Chairs -- Use chairs with straight backs, armrests and firm seats. Add firm cushions to existing pieces to add height.  Bathroom - Install grab bars and non-skid tape in the tub or shower. Use a bathtub transfer bench or a shower chair with a back support Use an elevated toilet seat and/or safety rails to assist standing from a low surface. Do not use towel racks or bathroom tissue holders to help you stand.  Lighting - Make sure halls, stairways, and entrances are well-lit. Install a night light in your bathroom or hallway. Make sure there is a light switch at the top and bottom of the staircase. Turn lights on if you get up in the middle of the night. Make sure lamps or light switches are within reach of the bed if you have to get  up during the night.  Kitchen - Install non-skid rubber mats near the sink and stove. Clean spills immediately. Store frequently used utensils, pots, pans between waist and eye level. This helps prevent reaching and bending. Sit when getting things out of lower cupboards.  Living room/ Bedrooms - Place furniture with wide spaces in between, giving enough room to move around. Establish a route through the living room that gives you something to hold onto as you  walk.  Stairs - Make sure treads, rails, and rugs are secure. Install a rail on both sides of the stairs. If stairs are a threat, it might be helpful to arrange most of your activities on the lower level to reduce the number of times you must climb the stairs.  Entrances and doorways - Install metal handles on the walls adjacent to the doorknobs of all doors to make it more secure as you travel through the doorway.  Tips for maintaining balance Keep at least one hand free at all times. Try using a backpack or fanny pack to hold things rather than carrying them in your hands. Never carry objects in both hands when walking as this interferes with keeping your balance.  Attempt to swing both arms from front to back while walking. This might require a conscious effort if Parkinson's disease has diminished your movement. It will, however, help you to maintain balance and posture, and reduce fatigue.  Consciously lift your feet off of the ground when walking. Shuffling and dragging of the feet is a common culprit in losing your balance.  When trying to navigate turns, use a "U" technique of facing forward and making a wide turn, rather than pivoting sharply.  Try to stand with your feet shoulder-length apart. When your feet are close together for any length of time, you increase your risk of losing your balance and falling.  Do one thing at a time. Don't try to walk and accomplish another task, such as reading or looking around. The decrease in your automatic reflexes complicates motor function, so the less distraction, the better.  Do not wear rubber or gripping soled shoes, they might "catch" on the floor and cause tripping.  Move slowly when changing positions. Use deliberate, concentrated movements and, if needed, use a grab bar or walking aid. Count 15 seconds between each movement. For example, when rising from a seated position, wait 15 seconds after standing to begin walking.  If balance is  a continuous problem, you might want to consider a walking aid such as a cane, walking stick, or walker. Once you've mastered walking with help, you might be ready to try it on your own again.

## 2022-04-06 LAB — PROTEIN ELECTROPHORESIS, SERUM
Albumin ELP: 4.2 g/dL (ref 3.8–4.8)
Alpha 1: 0.3 g/dL (ref 0.2–0.3)
Alpha 2: 0.7 g/dL (ref 0.5–0.9)
Beta 2: 0.4 g/dL (ref 0.2–0.5)
Beta Globulin: 0.5 g/dL (ref 0.4–0.6)
Gamma Globulin: 1.3 g/dL (ref 0.8–1.7)
Total Protein: 7.4 g/dL (ref 6.1–8.1)

## 2022-04-06 LAB — IMMUNOFIXATION ELECTROPHORESIS
IgG (Immunoglobin G), Serum: 1320 mg/dL (ref 600–1540)
IgM, Serum: 193 mg/dL (ref 50–300)
Immunoglobulin A: 251 mg/dL (ref 70–320)

## 2022-04-06 LAB — VITAMIN B1: Vitamin B1 (Thiamine): <6 nmol/L — ABNORMAL LOW (ref 8–30)

## 2022-04-12 ENCOUNTER — Other Ambulatory Visit: Payer: Commercial Managed Care - HMO

## 2022-04-12 ENCOUNTER — Ambulatory Visit (INDEPENDENT_AMBULATORY_CARE_PROVIDER_SITE_OTHER): Payer: Commercial Managed Care - HMO | Admitting: Podiatry

## 2022-04-12 DIAGNOSIS — L97512 Non-pressure chronic ulcer of other part of right foot with fat layer exposed: Secondary | ICD-10-CM | POA: Diagnosis not present

## 2022-04-12 NOTE — Progress Notes (Signed)
Chief Complaint  Patient presents with   Follow-up    Patient is here for follow-up ulcer both feet.    Subjective:  66 y.o. male with PMHx of diabetes mellitus presenting to the office today for follow-up evaluation of ulcers that have developed of the right great toe.  Patient states that his dogs have been chewing on his toes.  Overall patient states that there is significant improvement.  He continues to go barefoot throughout his house.  He says that socks and shoes cause pain to his feet and so he prefers barefoot.  Presenting for follow-up treatment and evaluation  Past Medical History:  Diagnosis Date   Arthritis    lower back   Asthma    Atrial fibrillation [facility])    Atrial flutter ([place])    s/p CTI ablation by Dr Rayann Heman   Brain aneurysm 2009   questionable. A follow up CTA in 2009 showed no evidence of   Chronic back pain    DDD/stenosis   Colon polyps    9 polyps removed 08/04/00   Complication of anesthesia 09/11/2012   slow to awaken after ablation   COPD (chronic obstructive pulmonary disease) ([place])    Diabetes mellitus    takes Metformin and Glimepiride daily   Emphysema    GERD (gastroesophageal reflux disease)    takes Omeprazole bid   Heart failure ([place])    History of shingles    HLD (hyperlipidemia)    takes Pravastatin daily   HTN (hypertension)    takes Prinizide daily   Obesity    OSA (obstructive sleep apnea)    not always using cpap   Overdose 2009   unintentional Flecanide overdose   Peripheral neuropathy    Short-term memory loss    Tobacco abuse     Past Surgical History:  Procedure Laterality Date   ANTERIOR CERVICAL DECOMP/DISCECTOMY FUSION  01/04/2012   Procedure: ANTERIOR CERVICAL DECOMPRESSION/DISCECTOMY FUSION 1 LEVEL/HARDWARE REMOVAL;  Surgeon: Ophelia Charter, MD;  Location: [place] [facility];  Service: Neurosurgery;  Laterality: N/A;  explore cervical fusion Cervical six - seven  with removal of codman plate anterior cervical  decompression with fusion interbody prothesis plating and bonegraft   APPENDECTOMY  2-41yr ago   ARyeN/A 09/11/2012   PT DID NOT HAVE AN ATRIAL FIBRILLATION ABLATION IN 2014!  ATRIAL FLUTTER ABLATION ONLY   ATRIAL FLUTTER ABLATION  09/11/2012   CTI ablation by Dr ARayann Heman  BIOPSY  11/07/2019   Procedure: BIOPSY;  Surgeon: JMilus Banister MD;  [facility];  Service: Endoscopy;;   CARDIAC CATHETERIZATION  2008   no significant CAD   CARDIOVERSION  05/05/2011   Procedure: CARDIOVERSION;  Surgeon: BLelon Perla MD;  [place]  Service: Cardiovascular;  Laterality: N/A;   CARDIOVERSION Bilateral 07/26/2012   Procedure: CARDIOVERSION;  Surgeon: JMinus Breeding MD;  [facility];  Service: Cardiovascular;  Laterality: Bilateral;   CARPAL TUNNEL RELEASE  99/2000   bilateral   COLONOSCOPY WITH PROPOFOL N/A 12/13/2012   Procedure: COLONOSCOPY WITH PROPOFOL;  Surgeon: DMilus Banister MD;  [facility];  Service: Endoscopy;  Laterality: N/A;   ELECTROPHYSIOLOGIC STUDY N/A 04/21/2015   Procedure: Atrial Fibrillation Ablation;  Surgeon: JThompson Grayer MD;  [place];  Service: Cardiovascular;  Laterality: N/A;   ESOPHAGOGASTRODUODENOSCOPY (EGD) WITH PROPOFOL N/A 11/07/2019   Procedure: ESOPHAGOGASTRODUODENOSCOPY (EGD) WITH PROPOFOL;  Surgeon: JMilus Banister MD;  [facility];  Service: Endoscopy;  Laterality: N/A;   HERNIA REPAIR     KNEE ARTHROSCOPY WITH MEDIAL MENISECTOMY Left 04/23/2021   Procedure: LEFT KNEE ARTHROSCOPY WITH PARTIAL MEDIAL MENISCECTOMY;  Surgeon: [name], MD;  Location: [place];  Service: Orthopedics;  Laterality: Left;   KNEE SURGERY  6-18yr ago   left   LEFT HEART CATH AND CORONARY ANGIOGRAPHY N/A 09/19/2016   Procedure: Left Heart Cath and Coronary Angiography;  Surgeon: SBelva Crome MD;  [facility];  Service: Cardiovascular;  Laterality: N/A;   Left  inguinal hernia repair     as a child   NASAL SEPTOPLASTY W/ TURBINOPLASTY Bilateral 02/19/2014   Procedure: NASAL SEPTOPLASTY WITH BILATERAL TURBINATE REDUCTION;  Surgeon: KJodi Marble MD;  [place]  Service: ENT;  Laterality: Bilateral;   NECK SURGERY  196yrago   right shoulder surgery  [id number]   cyst removed   TEE WITHOUT CARDIOVERSION  05/05/2011   Procedure: TRANSESOPHAGEAL ECHOCARDIOGRAM (TEE);  Surgeon: BriLelon PerlaD;  [facility];  Service: Cardiovascular;  Laterality: N/A;   TEE WITHOUT CARDIOVERSION N/A 04/21/2015   Procedure: TRANSESOPHAGEAL ECHOCARDIOGRAM (TEE);  Surgeon: TraSueanne MargaritaD;  [facility];  Service: Cardiovascular;  Laterality: N/A;   THROAT SURGERY  [id number]   "thought " it was cancer but came back not   TONSILLECTOMY      Allergies  Allergen Reactions   Adhesive [Tape]     itching   Latex Itching    When tape is on the skin too long skin gets red & itching    RT foot 02/22/2022  RT foot 02/22/2022   Objective/Physical Exam General: The patient is alert and oriented x3 in no acute distress.  Dermatology:  Wounds to the right great toe have resolved and healed completely.  Significant improvement.  There is no open wound today.  No drainage from any areas.  There is a heavy amount of callus formation around the weightbearing surfaces of the bilateral feet.  Vascular: VAS [place] W/WO TBI 12/23/2021 ABI Findings:  +---------+------------------+-----+---------+--------+  Right   Rt Pressure (mmHg)IndexWaveform Comment   +---------+------------------+-----+---------+--------+  Brachial 127                                       +---------+------------------+-----+---------+--------+  PTA     197               1.47 triphasic          +---------+------------------+-----+---------+--------+  DP      [place]                     triphasic           +---------+------------------+-----+---------+--------+  Great Toe90                0.67                    +---------+------------------+-----+---------+--------+   +---------+------------------+-----+---------+-------+  Left    Lt Pressure (mmHg)IndexWaveform Comment  +---------+------------------+-----+---------+-------+  Brachial 134                                      +---------+------------------+-----+---------+-------+  PTA     180               1.34 triphasic         +---------+------------------+-----+---------+-------+  DP      168               1.25 triphasic         +---------+------------------+-----+---------+-------+  Great Toe82                0.61                   +---------+------------------+-----+---------+-------+   +-------+-----------+-----------+------------+------------+  ABI/TBIToday's ABIToday's TBIPrevious ABIPrevious TBI  +-------+-----------+-----------+------------+------------+  Right [place]         0.67       [place]          1.08          +-------+-----------+-----------+------------+------------+  Left  [place]         0.61       1.26        0.77          +-------+-----------+-----------+------------+------------+  Arterial wall calcification precludes accurate ankle pressures and ABIs.    Summary:  Right: Resting right ankle-brachial index indicates noncompressible right  lower extremity arteries. The right toe-brachial index is abnormal.   Left: Resting left ankle-brachial index indicates noncompressible left  lower extremity arteries. The left toe-brachial index is abnormal.   Neurological: Light touch and protective threshold diminished bilaterally.   Musculoskeletal Exam: No prior amputations  Assessment: 1.  Ulcers right great toe secondary to diabetes mellitus 2. diabetes mellitus w/ peripheral neuropathy   Plan of Care:  1. Patient was evaluated. 2.  Again advised the patient against  going barefoot.  Recommend good supportive shoes and sneakers to protect his feet 3.  Wounds have healed.  He may now discontinue the topical antibiotic and recommend good daily diabetic foot lotion 4.  Return to clinic 3 months for routine footcare   Edrick Kins, DPM [facility]  Dr. Edrick Kins, DPM    [address], [place] [address]                Office [phone]  Fax [phone]

## 2022-04-14 ENCOUNTER — Other Ambulatory Visit: Payer: Self-pay

## 2022-04-14 DIAGNOSIS — M6259 Muscle wasting and atrophy, not elsewhere classified, multiple sites: Secondary | ICD-10-CM

## 2022-04-14 DIAGNOSIS — G250 Essential tremor: Secondary | ICD-10-CM

## 2022-04-14 DIAGNOSIS — M4802 Spinal stenosis, cervical region: Secondary | ICD-10-CM

## 2022-04-14 DIAGNOSIS — M48062 Spinal stenosis, lumbar region with neurogenic claudication: Secondary | ICD-10-CM

## 2022-04-14 DIAGNOSIS — R251 Tremor, unspecified: Secondary | ICD-10-CM

## 2022-04-15 ENCOUNTER — Encounter: Payer: Self-pay | Admitting: Neurology

## 2022-04-15 DIAGNOSIS — M6259 Muscle wasting and atrophy, not elsewhere classified, multiple sites: Secondary | ICD-10-CM

## 2022-04-15 DIAGNOSIS — R251 Tremor, unspecified: Secondary | ICD-10-CM

## 2022-04-15 DIAGNOSIS — M4802 Spinal stenosis, cervical region: Secondary | ICD-10-CM

## 2022-04-15 DIAGNOSIS — M48062 Spinal stenosis, lumbar region with neurogenic claudication: Secondary | ICD-10-CM

## 2022-04-20 ENCOUNTER — Encounter: Payer: Self-pay | Admitting: Dermatology

## 2022-04-20 ENCOUNTER — Ambulatory Visit (INDEPENDENT_AMBULATORY_CARE_PROVIDER_SITE_OTHER): Payer: Commercial Managed Care - HMO | Admitting: Dermatology

## 2022-04-20 VITALS — BP 91/76 | HR 90

## 2022-04-20 DIAGNOSIS — L820 Inflamed seborrheic keratosis: Secondary | ICD-10-CM | POA: Diagnosis not present

## 2022-04-20 DIAGNOSIS — Z808 Family history of malignant neoplasm of other organs or systems: Secondary | ICD-10-CM

## 2022-04-20 DIAGNOSIS — Z79899 Other long term (current) drug therapy: Secondary | ICD-10-CM

## 2022-04-20 DIAGNOSIS — B360 Pityriasis versicolor: Secondary | ICD-10-CM

## 2022-04-20 DIAGNOSIS — L578 Other skin changes due to chronic exposure to nonionizing radiation: Secondary | ICD-10-CM

## 2022-04-20 DIAGNOSIS — L570 Actinic keratosis: Secondary | ICD-10-CM | POA: Diagnosis not present

## 2022-04-20 DIAGNOSIS — D492 Neoplasm of unspecified behavior of bone, soft tissue, and skin: Secondary | ICD-10-CM

## 2022-04-20 DIAGNOSIS — B079 Viral wart, unspecified: Secondary | ICD-10-CM | POA: Diagnosis not present

## 2022-04-20 MED ORDER — KETOCONAZOLE 2 % EX SHAM: 1.0000 | MEDICATED_SHAMPOO | CUTANEOUS | 0 refills | Status: DC

## 2022-04-20 NOTE — Progress Notes (Signed)
New Patient Visit  Subjective  [name] is a 66 y.o. male who presents for the following: check spot (L leg, 7m painful/Scalp, 216yr painful when comb hits/Check spot, L flank, 20m65mPruritis (Back, 42m)32mnd Hx of AKs (Txted in past by Dr. TafeDenna HaggardNew patient referral from Heat[facility]re[facility] Pol.  The patient has spots, moles and lesions to be evaluated, some may be new or changing and the patient has concerns that these could be cancer.  The following portions of the chart were reviewed this encounter and updated as appropriate:   Tobacco  Allergies  Meds  Problems  Med Hx  Surg Hx  Fam Hx     Review of Systems:  No other skin or systemic complaints except as noted in HPI or Assessment and Plan.  Objective  Well appearing patient in no apparent distress; mood and affect are within normal limits.  A focused examination was performed including scalp, face, arms, back, left flank, left leg. Relevant physical exam findings are noted in the Assessment and Plan.  Left Popliteal Fossa Cutaneous horn 1.2cm       L flank x 1, scalp x 4 (5) Stuck on waxy paps with erythema  forehead x 1 Pink scaly macules  trunk Pink scaliness back, chest   Assessment & Plan   Actinic Damage - chronic, secondary to cumulative UV radiation exposure/sun exposure over time - diffuse scaly erythematous macules with underlying dyspigmentation - Recommend daily broad spectrum sunscreen SPF 30+ to sun-exposed areas, reapply every 2 hours as needed.  - Recommend staying in the shade or wearing long sleeves, sun glasses (UVA+UVB protection) and wide brim hats (4-inch brim around the entire circumference of the hat). - Call for new or changing lesions.   Neoplasm of skin Left Popliteal Fossa Epidermal / dermal shaving Lesion diameter (cm):  1.2 Informed consent: discussed and consent obtained   Timeout: patient name, date of birth, surgical site, and procedure verified   Procedure prep:   Patient was prepped and draped in usual sterile fashion Prep type:  Isopropyl alcohol Anesthesia: the lesion was anesthetized in a standard fashion   Anesthetic:  1% lidocaine w/ epinephrine 1-100,000 buffered w/ 8.4% NaHCO3 Instrument used: flexible razor blade   Hemostasis achieved with: pressure, aluminum chloride and electrodesiccation   Outcome: patient tolerated procedure well   Post-procedure details: sterile dressing applied and wound care instructions given   Dressing type: bandage and bacitracin    Destruction of lesion Complexity: extensive   Destruction method: electrodesiccation and curettage   Informed consent: discussed and consent obtained   Timeout:  patient name, date of birth, surgical site, and procedure verified Procedure prep:  Patient was prepped and draped in usual sterile fashion Prep type:  Isopropyl alcohol Anesthesia: the lesion was anesthetized in a standard fashion   Anesthetic:  1% lidocaine w/ epinephrine 1-100,000 buffered w/ 8.4% NaHCO3 Curettage performed in three different directions: Yes   Electrodesiccation performed over the curetted area: Yes   Lesion length (cm):  1.2 Lesion width (cm):  1.2 Margin per side (cm):  0.2 Final wound size (cm):  1.6 Hemostasis achieved with:  pressure, aluminum chloride and electrodesiccation Outcome: patient tolerated procedure well with no complications   Post-procedure details: sterile dressing applied and wound care instructions given   Dressing type: bandage and bacitracin    Specimen 1 - Surgical pathology Differential Diagnosis: D48.5 R/O SCC  Check Margins: No Cutaneous horn 1.2cm EDC  Inflamed seborrheic keratosis (5) L  flank x 1, scalp x 4 Symptomatic, irritating, patient would like treated. Destruction of lesion - L flank x 1, scalp x 4 Complexity: simple   Destruction method: cryotherapy   Informed consent: discussed and consent obtained   Timeout:  patient name, date of birth, surgical  site, and procedure verified Lesion destroyed using liquid nitrogen: Yes   Region frozen until ice ball extended beyond lesion: Yes   Outcome: patient tolerated procedure well with no complications   Post-procedure details: wound care instructions given    AK (actinic keratosis) forehead x 1 Destruction of lesion - forehead x 1 Complexity: simple   Destruction method: cryotherapy   Informed consent: discussed and consent obtained   Timeout:  patient name, date of birth, surgical site, and procedure verified Lesion destroyed using liquid nitrogen: Yes   Region frozen until ice ball extended beyond lesion: Yes   Outcome: patient tolerated procedure well with no complications   Post-procedure details: wound care instructions given    Tinea versicolor trunk Tinea versicolor is a chronic recurrent skin rash causing discolored scaly spots most commonly seen on back, chest, and/or shoulders.  It is generally asymptomatic. The rash is due to overgrowth of a common type of yeast present on everyone's skin and it is not contagious.  It tends to flare more in the summer due to increased sweating on trunk.  After rash is treated, the scaliness will resolve, but the discoloration will take longer to return to normal pigmentation. The periodic use of an OTC medicated soap/shampoo with zinc or selenium sulfide can be helpful to prevent yeast overgrowth and recurrence.  Start Ketoconazole 2% shampoo 2x/wk, let sit 5 minutes and rinse off, wash from scalp to waist, avoid eyes  ketoconazole (NIZORAL) 2 % shampoo - trunk Apply 1 Application topically 2 (two) times a week. 2x/wk, let sit 5 minutes and rinse off, wash from scalp to waist, avoid eyes  Family history of skin cancer - what type(s): pt not sure what type - who affected: father  Return in about 1 year (around 04/21/2023) for UBSE, Hx of AKs.  I, Othelia Pulling, RMA, am acting as scribe for Sarina Ser, MD . Documentation: I have reviewed the  above documentation for accuracy and completeness, and I agree with the above.  Sarina Ser, MD

## 2022-04-20 NOTE — Patient Instructions (Addendum)
Wound Care Instructions  Cleanse wound gently with soap and water once a day then pat dry with clean gauze. Apply a thin coat of Petrolatum (petroleum jelly, "Vaseline") over the wound (unless you have an allergy to this). We recommend that you use a new, sterile tube of Vaseline. Do not pick or remove scabs. Do not remove the yellow or white "healing tissue" from the base of the wound.  Cover the wound with fresh, clean, nonstick gauze and secure with paper tape. You may use Band-Aids in place of gauze and tape if the wound is small enough, but would recommend trimming much of the tape off as there is often too much. Sometimes Band-Aids can irritate the skin.  You should call the office for your biopsy report after 1 week if you have not already been contacted.  If you experience any problems, such as abnormal amounts of bleeding, swelling, significant bruising, significant pain, or evidence of infection, please call the office immediately.  FOR ADULT SURGERY PATIENTS: If you need something for pain relief you may take 1 extra strength Tylenol (acetaminophen) AND 2 Ibuprofen ('200mg'$  each) together every 4 hours as needed for pain. (do not take these if you are allergic to them or if you have a reason you should not take them.) Typically, you may only need pain medication for 1 to 3 days.     Cryotherapy Aftercare  Wash gently with soap and water everyday.   Apply Vaseline and Band-Aid daily until healed.   Tinea versicolor is a chronic recurrent skin rash causing discolored scaly spots most commonly seen on back, chest, and/or shoulders.  It is generally asymptomatic. The rash is due to overgrowth of a common type of yeast present on everyone's skin and it is not contagious.  It tends to flare more in the summer due to increased sweating on trunk.  After rash is treated, the scaliness will resolve, but the discoloration will take longer to return to normal pigmentation. The periodic use of an OTC  medicated soap/shampoo with zinc or selenium sulfide can be helpful to prevent yeast overgrowth and recurrence.   Start Ketoconazole 2% shampoo as body wash from waist to scalp 2 times a week in shower, let sit 5 minutes and rinse off.  Avoid eyes.  Due to recent changes in healthcare laws, you may see results of your pathology and/or laboratory studies on MyChart before the doctors have had a chance to review them. We understand that in some cases there may be results that are confusing or concerning to you. Please understand that not all results are received at the same time and often the doctors may need to interpret multiple results in order to provide you with the best plan of care or course of treatment. Therefore, we ask that you please give [place] 2 business days to thoroughly review all your results before contacting the office for clarification. Should we see a critical lab result, you will be contacted sooner.   If You Need Anything After Your Visit  If you have any questions or concerns for your doctor, please call our main line at [phone] and press option 4 to reach your doctor's medical assistant. If no one answers, please leave a voicemail as directed and we will return your call as soon as possible. Messages left after 4 pm will be answered the following business day.   You may also send [place] a message via [place]. We typically respond to MyChart messages within 1-2 business  days.  For prescription refills, please ask your pharmacy to contact our office. Our fax number is [phone].  If you have an urgent issue when the clinic is closed that cannot wait until the next business day, you can page your doctor at the number below.    Please note that while we do our best to be available for urgent issues outside of office hours, we are not available 24/7.   If you have an urgent issue and are unable to reach [place], you may choose to seek medical care at your doctor's office, retail  clinic, urgent care center, or emergency room.  If you have a medical emergency, please immediately call 911 or go to the emergency department.  Pager Numbers  - Dr. Nehemiah Massed: [phone]  - Dr. Laurence Ferrari: [phone]  - Dr. Nicole Kindred: [phone]  In the event of inclement weather, please call our main line at [phone] for an update on the status of any delays or closures.  Dermatology Medication Tips: Please keep the boxes that topical medications come in in order to help keep track of the instructions about where and how to use these. Pharmacies typically print the medication instructions only on the boxes and not directly on the medication tubes.   If your medication is too expensive, please contact our office at [phone] option 4 or send [place] a message through [place].   We are unable to tell what your co-pay for medications will be in advance as this is different depending on your insurance coverage. However, we may be able to find a substitute medication at lower cost or fill out paperwork to get insurance to cover a needed medication.   If a prior authorization is required to get your medication covered by your insurance company, please allow [place] 1-2 business days to complete this process.  Drug prices often vary depending on where the prescription is filled and some pharmacies may offer cheaper prices.  The website www.goodrx.com contains coupons for medications through different pharmacies. The prices here do not account for what the cost may be with help from insurance (it may be cheaper with your insurance), but the website can give you the price if you did not use any insurance.  - You can print the associated coupon and take it with your prescription to the pharmacy.  - You may also stop by our office during regular business hours and pick up a GoodRx coupon card.  - If you need your prescription sent electronically to a different pharmacy, notify our office through [facility] or by phone at [phone] option 4.     Si Usted Necesita Algo Despus de Su Visita  Tambin puede enviarnos un mensaje a travs de Pharmacist, community. Por lo general respondemos a los mensajes de MyChart en el transcurso de 1 a 2 das hbiles.  Para renovar recetas, por favor pida a su farmacia que se ponga en contacto con nuestra oficina. Harland Dingwall de fax es [place] [phone].  Si tiene un asunto urgente cuando la clnica est cerrada y que no puede esperar hasta el siguiente da hbil, puede llamar/localizar a su doctor(a) al nmero que aparece a continuacin.   Por favor, tenga en cuenta que aunque hacemos todo lo posible para estar disponibles para asuntos urgentes fuera del horario de [place], no estamos disponibles las 24 horas del da, los 7 das de la [place].   Si tiene un problema urgente y no puede comunicarse con nosotros, puede optar por buscar atencin mdica  en el consultorio de su doctor(a), en una clnica privada, en un centro de atencin urgente o en una sala de emergencias.  Si tiene Engineering geologist, por favor llame inmediatamente al 911 o vaya a la sala de emergencias.  Nmeros de bper  - Dr. Nehemiah Massed: [phone]  - Dra. Moye: [phone]  - Dra. Nicole Kindred: [phone]  En caso de inclemencias del [place], por favor llame a Johnsie Kindred principal al [phone] para una actualizacin sobre el [place] de cualquier retraso o cierre.  Consejos para la medicacin en dermatologa: Por favor, guarde las cajas en las que vienen los medicamentos de uso tpico para ayudarle a seguir las instrucciones sobre dnde y cmo usarlos. Las farmacias generalmente imprimen las instrucciones del medicamento slo en las cajas y no directamente en los tubos del [place].   Si su medicamento es muy caro, por favor, pngase en contacto con Zigmund Daniel llamando al [phone] y presione la opcin 4 o envenos un mensaje a travs de Pharmacist, community.   No podemos decirle  cul ser su copago por los medicamentos por adelantado ya que esto es diferente dependiendo de la cobertura de su seguro. Sin embargo, es posible que podamos encontrar un medicamento sustituto a Electrical engineer un formulario para que el seguro cubra el medicamento que se considera necesario.   Si se requiere una autorizacin previa para que su compaa de seguros [place] su medicamento, por favor permtanos de 1 a 2 das hbiles para completar este proceso.  Los precios de los medicamentos varan con frecuencia dependiendo del Environmental consultant de dnde se surte la receta y alguna farmacias pueden ofrecer precios ms baratos.  El sitio web www.goodrx.com tiene cupones para medicamentos de Airline pilot. Los precios aqu no tienen en cuenta lo que podra costar con la ayuda del seguro (puede ser ms barato con su seguro), pero el sitio web puede darle el precio si no utiliz Research scientist (physical sciences).  - Puede imprimir el cupn correspondiente y llevarlo con su receta a la farmacia.  - Tambin puede pasar por nuestra oficina durante el horario de atencin regular y Charity fundraiser una tarjeta de cupones de GoodRx.  - Si necesita que su receta se enve electrnicamente a una farmacia diferente, informe a nuestra oficina a travs de MyChart de [place] o por telfono llamando al [phone] y presione la opcin 4.

## 2022-04-21 ENCOUNTER — Ambulatory Visit: Payer: Commercial Managed Care - HMO

## 2022-04-25 ENCOUNTER — Ambulatory Visit: Payer: Commercial Managed Care - HMO | Attending: Cardiovascular Disease

## 2022-04-25 DIAGNOSIS — Z5181 Encounter for therapeutic drug level monitoring: Secondary | ICD-10-CM

## 2022-04-25 DIAGNOSIS — Z7901 Long term (current) use of anticoagulants: Secondary | ICD-10-CM

## 2022-04-25 DIAGNOSIS — I480 Paroxysmal atrial fibrillation: Secondary | ICD-10-CM | POA: Diagnosis not present

## 2022-04-25 LAB — POCT INR: INR: 2.1 (ref 2.0–3.0)

## 2022-04-25 NOTE — Patient Instructions (Signed)
Continue taking warfarin 1/2 tablet daily except for 1 tablet every Mondays and Fridays.  Recheck INR in 6 weeks. Anticoagulation Clinic [phone]

## 2022-04-28 ENCOUNTER — Telehealth: Payer: Self-pay

## 2022-04-28 NOTE — Telephone Encounter (Signed)
-----   Message from Ralene Bathe, MD sent at 04/28/2022  3:00 PM EST ----- Diagnosis Skin , left popliteal fossa VERRUCA VULGARIS, IRRITATED  Benign viral wart May recur No further treatment needed unless recurs

## 2022-04-28 NOTE — Telephone Encounter (Signed)
Patient advised pathology showed benign viral wart, no tx needed at this time. [name]., RMA

## 2022-05-01 ENCOUNTER — Encounter: Payer: Self-pay | Admitting: Dermatology

## 2022-05-11 ENCOUNTER — Encounter: Payer: Self-pay | Admitting: Internal Medicine

## 2022-05-11 ENCOUNTER — Ambulatory Visit (INDEPENDENT_AMBULATORY_CARE_PROVIDER_SITE_OTHER): Payer: Commercial Managed Care - HMO | Admitting: Internal Medicine

## 2022-05-11 VITALS — BP 130/82 | HR 75 | Ht 74.0 in | Wt 255.6 lb

## 2022-05-11 DIAGNOSIS — N1831 Chronic kidney disease, stage 3a: Secondary | ICD-10-CM

## 2022-05-11 DIAGNOSIS — E1122 Type 2 diabetes mellitus with diabetic chronic kidney disease: Secondary | ICD-10-CM

## 2022-05-11 DIAGNOSIS — E785 Hyperlipidemia, unspecified: Secondary | ICD-10-CM

## 2022-05-11 DIAGNOSIS — Z794 Long term (current) use of insulin: Secondary | ICD-10-CM | POA: Diagnosis not present

## 2022-05-11 LAB — POCT GLYCOSYLATED HEMOGLOBIN (HGB A1C): Hemoglobin A1C: 7.6 % — AB (ref 4.0–5.6)

## 2022-05-11 MED ORDER — DEXCOM G7 RECEIVER DEVI: 1.0000 | Freq: Once | 0 refills | Status: AC

## 2022-05-11 MED ORDER — HUMULIN R U-500 KWIKPEN 500 UNIT/ML ~~LOC~~ SOPN: PEN_INJECTOR | SUBCUTANEOUS | 3 refills | Status: DC

## 2022-05-11 MED ORDER — DEXCOM G7 SENSOR MISC: 3.0000 | 4 refills | Status: DC

## 2022-05-11 NOTE — Patient Instructions (Addendum)
Please continue: - Trulicity 3 mg weekly  Change from Basaglar and Aspart to U500 insulin: - 80-90 units before b'fast - 40-50 units before dinner  Please return in 1.5 months.

## 2022-05-11 NOTE — Progress Notes (Signed)
Patient ID: [name], male   DOB: [birth year], 66 y.o.   MRN: [id number]  HPI: [name] is a 66 y.o.-year-old male, returning for follow-up for DM2, dx in 2012, insulin-dependent since 2019, uncontrolled, with complications (CHF, Afib/flutter, PAD, CKD, PN). Pt. previously saw Dr. Loanne Drilling, but last visit with me 4 months ago.  Interim history: Since last visit, he had very high blood sugars after steroids.  We increased his mealtime insulin. No increased urination, blurry vision, nausea, chest pain, palpitations.  Reviewed HbA1c: Lab Results  Component Value Date   HGBA1C 7.7 (A) 11/30/2021   HGBA1C 7.6 (A) 07/29/2021   HGBA1C 8.1 (A) 05/26/2021   HGBA1C 7.6 (A) 11/09/2020   HGBA1C 7.6 (A) 08/20/2020   HGBA1C 8.1 (A) 06/17/2020   HGBA1C 8.7 (A) 04/13/2020   HGBA1C 8.4 (A) 11/21/2019   HGBA1C 8.1 (A) 10/08/2019   HGBA1C 8.4 (A) 08/22/2019   At last visit he was on: - Trulicity 3 mg weekly - Humalog 0-60 units 1-5x a day, before meals - max amount 270 units/day (!) Per review of Dr. Cordelia Pen notes, time-released insulins caused a rash in the past.  Patient does not remember this at today's visit. He tried [place] but this caused dehydration. Metformin dose is limited by abdominal pain. Trulicity dose is limited by nausea. In the past, he has been missing insulin 2 to 3 days at that time per review of Dr. Cordelia Pen notes.  Currently on: - Trulicity 3 mg weekly - NovoLog >> Aspart 40-50 units 3x a day, before meals - Basaglar 70 >> 75 units at bedtime  Pt checks his sugars >4x a day with his Dexcom CGM:  Prev.:   Lowest sugar was 60s >> 120; he has hypoglycemia awareness at 110.  Highest sugar was 600 >> 300s.  Glucometer: ReliOn  Pt's meals are: - Breakfast: sausage biscuit - Lunch: banana sandwich, leftovers - Dinner: meat and veggies, seldom cake - Snacks: occas. - pasta, etc.  - + CKD, last BUN/creatinine:  Lab Results  Component Value Date   BUN  22 03/01/2022   BUN 18 08/12/2021   CREATININE 1.50 03/01/2022   CREATININE 1.26 08/12/2021  On diltiazem, Cozaar 50 mg daily.  -+ HL; last set of lipids: Lab Results  Component Value Date   CHOL 122 08/18/2017   HDL 29 (L) 08/18/2017   LDLCALC 13 08/18/2017   LDLDIRECT 99.9 09/08/2011   TRIG 400 (H) 08/18/2017   CHOLHDL 4.2 08/18/2017  Off pravastatin 80 mg daily - stopped 3 mo ago 2/2 mm weakness.  - last eye exam was in 2022. Reportedly No DR.   - + numbness and tingling in his feet. Also, significant pain.  Last foot exam 12/2021.  On Neurontin 300 mg 3 capsules twice daily. He sees pain management.  He also has a history of HTN, OSA, COPD, GERD, DDD, gout. He is still smoking.  ROS: + see HPI  Past Medical History:  Diagnosis Date   Actinic keratosis    Arthritis    lower back   Asthma    Atrial fibrillation [facility])    Atrial flutter ([place])    s/p CTI ablation by Dr Rayann Heman   Brain aneurysm 2009   questionable. A follow up CTA in 2009 showed no evidence of   Chronic back pain    DDD/stenosis   Colon polyps    9 polyps removed 2/50/53   Complication of anesthesia 09/11/2012   slow to awaken after ablation  COPD (chronic obstructive pulmonary disease) (HCC)    Diabetes mellitus    takes Metformin and Glimepiride daily   Emphysema    GERD (gastroesophageal reflux disease)    takes Omeprazole bid   Heart failure (HCC)    History of shingles    HLD (hyperlipidemia)    takes Pravastatin daily   HTN (hypertension)    takes Prinizide daily   Obesity    OSA (obstructive sleep apnea)    not always using cpap   Overdose 2009   unintentional Flecanide overdose   Peripheral neuropathy    Short-term memory loss    Tobacco abuse    Past Surgical History:  Procedure Laterality Date   ANTERIOR CERVICAL DECOMP/DISCECTOMY FUSION  01/04/2012   Procedure: ANTERIOR CERVICAL DECOMPRESSION/DISCECTOMY FUSION 1 LEVEL/HARDWARE REMOVAL;  Surgeon: Ophelia Charter, MD;   Location: [place] [facility];  Service: Neurosurgery;  Laterality: N/A;  explore cervical fusion Cervical six - seven  with removal of codman plate anterior cervical decompression with fusion interbody prothesis plating and bonegraft   APPENDECTOMY  2-39yr ago   [place]/A 09/11/2012   PT DID NOT HAVE AN ATRIAL FIBRILLATION ABLATION IN 2014!  ATRIAL FLUTTER ABLATION ONLY   ATRIAL FLUTTER ABLATION  09/11/2012   CTI ablation by Dr ARayann Heman  BIOPSY  11/07/2019   Procedure: BIOPSY;  Surgeon: JMilus Banister MD;  [facility];  Service: Endoscopy;;   CARDIAC CATHETERIZATION  2008   no significant CAD   CARDIOVERSION  05/05/2011   Procedure: CARDIOVERSION;  Surgeon: BLelon Perla MD;  [place] [facility]  Service: Cardiovascular;  Laterality: N/A;   CARDIOVERSION Bilateral 07/26/2012   Procedure: CARDIOVERSION;  Surgeon: JMinus Breeding MD;  [facility];  Service: Cardiovascular;  Laterality: Bilateral;   CARPAL TUNNEL RELEASE  99/2000   bilateral   COLONOSCOPY WITH PROPOFOL N/A 12/13/2012   Procedure: COLONOSCOPY WITH PROPOFOL;  Surgeon: DMilus Banister MD;  [facility];  Service: Endoscopy;  Laterality: N/A;   ELECTROPHYSIOLOGIC STUDY N/A 04/21/2015   Procedure: Atrial Fibrillation Ablation;  Surgeon: JThompson Grayer MD;  [place];  Service: Cardiovascular;  Laterality: N/A;   ESOPHAGOGASTRODUODENOSCOPY (EGD) WITH PROPOFOL N/A 11/07/2019   Procedure: ESOPHAGOGASTRODUODENOSCOPY (EGD) WITH PROPOFOL;  Surgeon: JMilus Banister MD;  [facility];  Service: Endoscopy;  Laterality: N/A;   HERNIA REPAIR     KNEE ARTHROSCOPY WITH MEDIAL MENISECTOMY Left 04/23/2021   Procedure: LEFT KNEE ARTHROSCOPY WITH PARTIAL MEDIAL MENISCECTOMY;  Surgeon: YMarybelle Killings MD;  [facility]  Service: Orthopedics;  Laterality: Left;   KNEE SURGERY  [id number]   left   LEFT HEART CATH AND CORONARY ANGIOGRAPHY N/A 09/19/2016   Procedure:  Left Heart Cath and Coronary Angiography;  Surgeon: SmBelva CromeMD;  [facility];  Service: Cardiovascular;  Laterality: N/A;   Left inguinal hernia repair     as a child   NASAL SEPTOPLASTY W/ TURBINOPLASTY Bilateral 02/19/2014   Procedure: NASAL SEPTOPLASTY WITH BILATERAL TURBINATE REDUCTION;  Surgeon: KaJodi MarbleMD;  [facility];  Laterality: Bilateral;   NECK SURGERY  [id number]   right shoulder surgery  [id number]   cyst removed   TEE WITHOUT CARDIOVERSION  05/05/2011   Procedure: TRANSESOPHAGEAL ECHOCARDIOGRAM (TEE);  Surgeon: BriaLelon Perla;  [facility];  Service: Cardiovascular;  Laterality: N/A;   TEE WITHOUT CARDIOVERSION N/A 04/21/2015   Procedure: TRANSESOPHAGEAL ECHOCARDIOGRAM (TEE);  Surgeon:  Sueanne Margarita, MD;  [facility];  Service: Cardiovascular;  Laterality: N/A;   THROAT SURGERY  4-23yr ago   "thought " it was cancer but came back not   TONSILLECTOMY     Social History   Socioeconomic History   Marital status: Married    Spouse name: Not on file   Number of children: 2   Years of education: Not on file   Highest education level: Not on file  Occupational History   Occupation: MDealer  Tobacco Use   Smoking status: Every Day    Packs/day: 1.00    Years: 49.00    Total pack years: 49.00    Types: Cigarettes   Smokeless tobacco: Never   Tobacco comments:    Smoking almost a pack per day 03/08/2022 hfb  Vaping Use   Vaping Use: Never used  Substance and Sexual Activity   Alcohol use: Not Currently    Alcohol/week: 0.0 standard drinks of alcohol    Comment: 12/05/17 pt stated he has drinked 1/2 drink in 3 years   Drug use: No   Sexual activity: Yes  Other Topics Concern   Not on file  Social History Narrative   Daily caffeine(Mountain Dew) [place].Unemployed due to chronic back/ leg pain         Are you right handed or left handed? Left Handed    Are  you currently employed ? No    What is your current occupation? No   Do you live at home alone? no   Who lives with you? Lives with wife and 3 doggies   What type of home do you live in: 1 story or 2 story? Lives in one story home       Social Determinants of Health   Financial Resource Strain: Not on file  Food Insecurity: Not on file  Transportation Needs: Not on file  Physical Activity: Not on file  Stress: Not on file  Social Connections: Not on file  Intimate Partner Violence: Not on file   Current Outpatient Medications on File Prior to Visit  Medication Sig Dispense Refill   albuterol (PROAIR HFA) 108 (90 Base) MCG/ACT inhaler Inhale 1-2 puffs into the lungs every 6 (six) hours as needed for wheezing or shortness of breath. 18 g 5   albuterol (PROVENTIL) (2.5 MG/3ML) 0.083% nebulizer solution Take 3 mLs (2.5 mg total) by nebulization every 6 (six) hours as needed for wheezing or shortness of breath. 75 mL 12   b complex vitamins tablet Take 1 tablet by mouth daily.      bisoprolol (ZEBETA) 10 MG tablet Take 10 mg by mouth daily.      colchicine 0.6 MG tablet TAKE 2 TABLETS BY MOUTH ONCE. THEN TAKE 1 TABLET 1 HOUR LATER. WAIT 2 DAYS, THEN BEGIN TAKING 1 TABLET DAILY TO PREVENT ACUTE GOUT. 33 tablet 2   Continuous Blood Gluc Receiver (DEXCOM G6 RECEIVER) DEVI      Continuous Blood Gluc Receiver (DEXCOM G7 RECEIVER) DEVI Use as instructed to check blood sugar 1 each 0   Continuous Blood Gluc Sensor (DEXCOM G7 SENSOR) MISC Use as instructed to check blood sugar 9 each 1   Continuous Blood Gluc Transmit (DEXCOM G6 TRANSMITTER) MISC USE AS DIRECTED 1 each 3   cyclobenzaprine (FLEXERIL) 10 MG tablet Take 10 mg by mouth 3 (three) times daily.     diltiazem (CARDIZEM CD) 120 MG 24 hr capsule Take 1 capsule (120 mg  total) by mouth daily. 90 capsule 1   Dulaglutide (TRULICITY) 3 DX/8.3JA SOPN Inject 3 mg as directed once a week. 6 mL 3   fluticasone-salmeterol (ADVAIR) 100-50 MCG/ACT AEPB  Inhale 1 puff into the lungs 2 (two) times daily. 60 each 5   gabapentin (NEURONTIN) 300 MG capsule TAKE 3 CAPSULES BY MOUTH TWICE DAILY FOR NEUROPATHY 540 capsule 2   Glucagon 3 MG/DOSE POWD Place 3 mg into the nose once as needed for up to 1 dose. 1 each 11   insulin aspart (NOVOLOG FLEXPEN) 100 UNIT/ML FlexPen Inject 40-60 Units into the skin 3 (three) times daily with meals. 90 mL 1   Insulin Glargine (BASAGLAR KWIKPEN) 100 UNIT/ML Inject 70 Units into the skin daily. 60 mL 1   Insulin Pen Needle (EASY COMFORT PEN NEEDLES) 31G X 5 MM MISC To use with as needed insulin dosing up to four times daily 400 each 3   ketoconazole (NIZORAL) 2 % cream Apply 1 Application topically daily. 60 g 2   ketoconazole (NIZORAL) 2 % shampoo Apply 1 Application topically 2 (two) times a week. 2x/wk, let sit 5 minutes and rinse off, wash from scalp to waist, avoid eyes 120 mL 0   losartan (COZAAR) 50 MG tablet TAKE 1 TABLET BY MOUTH DAILY 90 tablet 3   metolazone (ZAROXOLYN) 2.5 MG tablet TAKE 1 TABLET BY MOUTH ON TUESDAY AND THURSDAY 30 MINUTES BEFORE TAKING TORSEMIDE OR AS DIRECTED 15 tablet 3   mupirocin ointment (BACTROBAN) 2 % Ply small amount to effected area twice daily for 10 days. 22 g 1   nitroGLYCERIN (NITROSTAT) 0.4 MG SL tablet Place 1 tablet (0.4 mg total) under the tongue every 5 (five) minutes x 3 doses as needed for chest pain. 25 tablet 3   omeprazole (PRILOSEC) 20 MG capsule TAKE 1 CAPSULE BY MOUTH TWICE DAILY 60 capsule 11   oxyCODONE-acetaminophen (PERCOCET) 10-325 MG tablet Take 1.5 tablets by mouth in the morning, at noon, in the evening, and at bedtime.     potassium chloride SA (KLOR-CON M) 20 MEQ tablet TAKE 1 TABLET BY MOUTH DAILY. TAKE AN EXTRA TABLET ON TUESDAY AND THURSDAY 40 tablet 6   pravastatin (PRAVACHOL) 80 MG tablet TAKE 1 TABLET BY MOUTH DAILY AT BEDTIME (Patient taking differently: On a 3 week break) 30 tablet 10   primidone (MYSOLINE) 50 MG tablet Take 1 tablet (50 mg total) by  mouth in the morning and at bedtime. Take 1/2 tablet (25 mg) for 1 week, then take 1 tablet (50 mg) for 1 week, then take 1 tablet (50 mg) twice daily (morning and night) thereafter. 60 tablet 3   sulfamethoxazole-trimethoprim (BACTRIM DS) 800-160 MG tablet Take 1 tablet by mouth 2 (two) times daily. 20 tablet 0   tamsulosin (FLOMAX) 0.4 MG CAPS capsule TAKE 1 CAPSULE BY MOUTH EACH NIGHT AT BEDTIME 30 capsule 11   torsemide (DEMADEX) 20 MG tablet Take 2 tablets in the morning and 1 tablet in the PM 270 tablet 3   triamcinolone cream (KENALOG) 0.1 % Apply 1 Application topically daily as needed. 453 g 2   umeclidinium bromide (INCRUSE ELLIPTA) 62.5 MCG/ACT AEPB Inhale 1 puff into the lungs daily. 30 each 5   VITAMIN A PO Take 2,400 mcg by mouth daily.      Vitamin D, Ergocalciferol, (DRISDOL) 1.25 MG (50000 UNIT) CAPS capsule TAKE 1 CAPSULE BY MOUTH EVERY 7 DAYS 12 capsule 3   warfarin (COUMADIN) 5 MG tablet Take 1/2 to 1 tablet  daily or as directed 40 tablet 3   No current facility-administered medications on file prior to visit.   Allergies  Allergen Reactions   Adhesive [Tape]     itching   Latex Itching    When tape is on the skin too long skin gets red & itching   Family History  Problem Relation Age of Onset   Diabetes Mother    Heart disease Father    Lung cancer Father    Diabetes Maternal Grandmother    Colon cancer Neg Hx    Anesthesia problems Neg Hx    Hypotension Neg Hx    Malignant hyperthermia Neg Hx    Pseudochol deficiency Neg Hx    Esophageal cancer Neg Hx    Pancreatic cancer Neg Hx    Stomach cancer Neg Hx    PE: BP 130/82 (BP Location: Right Arm, Patient Position: Sitting, Cuff Size: Normal)   Pulse 75   Ht '6\' 2"'$  (1.88 m)   Wt 255 lb 9.6 oz (115.9 kg)   SpO2 98%   BMI 32.82 kg/m  Wt Readings from Last 3 Encounters:  05/11/22 255 lb 9.6 oz (115.9 kg)  03/31/22 247 lb (112 kg)  03/08/22 244 lb 9.6 oz (110.9 kg)   Constitutional: overweight, in NAD,  walks with a cane Eyes: no exophthalmos ENT: no thyromegaly, no cervical lymphadenopathy Cardiovascular: RRR, No MRG Respiratory: CTA B Musculoskeletal: no deformities Skin: moist, warm, + rash B shins - ?NLD; also cigarette burns on bilateral legs as he dropped his cigarette while sleeping Neurological: +  tremor with outstretched hands  ASSESSMENT: 1. DM2, insulin-dependent, uncontrolled, with complications - CHF - A fib/flutter - PAD - CKD stage III - PN  2. HL  PLAN:  1. Patient with longstanding, uncontrolled, diabetes, on injectable antidiabetic regimen with weekly GLP-1 receptor agonist and basal/bolus insulin regimen, with still poor control.  At last visit, we increased his NovoLog and added back basal insulin.  He had to titrate the doses due to very high blood sugars. CGM interpretation: -At today's visit, we reviewed his CGM downloads: It appears that 22% of values are in target range (goal >70%), while 78% are higher than 180 (goal <25%), and 0% are lower than 70 (goal <4%).  The calculated average blood sugar is 206.  The projected HbA1c for the next 3 months (GMI) is 8.2%. -Reviewing the CGM trends, sugars are fluctuating usually above the target range, spiking after breakfast and after dinner.  Upon questioning, he feels that all of his blood sugars remain high regardless of how much insulin he injects.   His only blood sugars that are within the target range more consistently are before dinner. -At today's visit, we discussed about the fact that he is using high doses of insulin and he probably would benefit from more concentrated insulin, in the U-500 form.  He agrees to use this.  Discussed about correct injection techniques including injecting 30 minutes before breakfast and dinner.  He mostly has 2 meals a day.  He sometimes eats lunch but this is usually out and he forgets to take insulin with him.  We discussed that if we start U-500 insulin, he can continue with twice  a day administration. - I suggested to:  Patient Instructions  Please continue: - Trulicity 3 mg weekly  Change from Basaglar and Aspart to U500 insulin: - 80-90 units before b'fast - 40-50 units before dinner  Please return in 1.5 months.  - we checked his  HbA1c: 7.6% (slightly lower than before but definitely lower than expected) - advised to check sugars at different times of the day - 4x a day, rotating check times - advised for yearly eye exams >> he is UTD - he has severe peripheral neuropathy for which she sees podiatry and pain management.  He was advised by pain management to start the new medication but he has not heard back from the insurance (PA was sent).   - RTC in 1.5 mo  2. HL -Reviewed latest lipid panel from 2019: LDL low, child-size high, HDL low: Lab Results  Component Value Date   CHOL 122 08/18/2017   HDL 29 (L) 08/18/2017   LDLCALC 13 08/18/2017   LDLDIRECT 99.9 09/08/2011   TRIG 400 (H) 08/18/2017   CHOLHDL 4.2 08/18/2017  -He was taken off pravastatin 80 mg daily due to muscle weakness.  However, he feels that the muscle weakness is related to his spine. -He had a lipid panel checked by PCP-we will need records  Philemon Kingdom, MD PhD [facility]

## 2022-05-12 ENCOUNTER — Other Ambulatory Visit: Payer: Commercial Managed Care - HMO

## 2022-05-17 ENCOUNTER — Telehealth: Payer: Self-pay | Admitting: Neurology

## 2022-05-17 NOTE — Telephone Encounter (Signed)
Pt called in stating they cancelled his MRI because there was an issue with the insurance. [facility] told him to contact our office. The pt still has the same insurance for 2024 as 2023.

## 2022-05-17 NOTE — Telephone Encounter (Signed)
Please advise of reason for Cancel appt.

## 2022-05-18 NOTE — Telephone Encounter (Signed)
The pt called back and advised the pharmacy is not able to run the prescription for the [place].

## 2022-05-19 ENCOUNTER — Other Ambulatory Visit (HOSPITAL_COMMUNITY): Payer: Self-pay

## 2022-05-19 NOTE — Telephone Encounter (Signed)
Ran test claim and received "Refill too soon" message. Will be able to rerun on 05/22/2022.

## 2022-05-20 ENCOUNTER — Telehealth: Payer: Self-pay

## 2022-05-20 NOTE — Telephone Encounter (Signed)
Pt contacted and advised to increase his insulin - U500 insulin: - 80-90 >> 100-110 units before b'fast - 40-50 >> 60-70 units before dinner Pt advised his blood sugar was 232 and he has not had anything but water. Pt states he was about to start dinner and wanted to know how much he should take. Provider advised pt should take 70 units before dinner today. Pt advised to contact office in a few days with his bs readings.

## 2022-05-20 NOTE — Telephone Encounter (Signed)
T, In that case, we will need to increase his insulin doses.  We purposely started at a lower doses and we need to titrate these based on the blood sugars.  Per my records he is on the following regimen.  Please advise him to increase the doses as shown in bold: Please continue: - Trulicity 3 mg weekly - U500 insulin: - 80-90 >> 100-110 units before b'fast - 40-50 >> 60-70 units before dinner Also, lets send a prescription for 3 boxes of 2 pens with 3 refills.  Thank you!

## 2022-05-20 NOTE — Telephone Encounter (Signed)
Pt called to advise his blood sugars have been running in the 200's since he's switched to the Humulin U500 insulin. Pt is taking as prescribed and his morning sugars have been 230 and higher. Pt was also only given 2 pens and will run out of insulin in about 2.5 weeks maybe less.

## 2022-06-06 ENCOUNTER — Ambulatory Visit: Payer: Commercial Managed Care - HMO | Attending: Internal Medicine | Admitting: *Deleted

## 2022-06-06 DIAGNOSIS — I480 Paroxysmal atrial fibrillation: Secondary | ICD-10-CM | POA: Diagnosis not present

## 2022-06-06 DIAGNOSIS — Z7901 Long term (current) use of anticoagulants: Secondary | ICD-10-CM | POA: Diagnosis not present

## 2022-06-06 DIAGNOSIS — I4892 Unspecified atrial flutter: Secondary | ICD-10-CM

## 2022-06-06 LAB — POCT INR: INR: 2 (ref 2.0–3.0)

## 2022-06-06 NOTE — Patient Instructions (Signed)
Description   Today take 1.5 tablets then continue taking warfarin 1/2 tablet daily except for 1 tablet every Mondays and Fridays. Recheck INR in 6 weeks. Anticoagulation Clinic [phone]

## 2022-06-09 ENCOUNTER — Other Ambulatory Visit: Payer: Self-pay | Admitting: Nurse Practitioner

## 2022-06-09 ENCOUNTER — Other Ambulatory Visit: Payer: Self-pay | Admitting: Cardiology

## 2022-06-09 DIAGNOSIS — M10029 Idiopathic gout, unspecified elbow: Secondary | ICD-10-CM

## 2022-06-13 ENCOUNTER — Telehealth: Payer: Self-pay

## 2022-06-13 DIAGNOSIS — E1122 Type 2 diabetes mellitus with diabetic chronic kidney disease: Secondary | ICD-10-CM

## 2022-06-13 MED ORDER — HUMULIN R U-500 KWIKPEN 500 UNIT/ML ~~LOC~~ SOPN: 110.0000 [IU] | PEN_INJECTOR | Freq: Two times a day (BID) | SUBCUTANEOUS | 1 refills | Status: DC

## 2022-06-13 NOTE — Telephone Encounter (Signed)
Pt called to advise he is taking about 135 units of insulin in the morning and 90 units in the evening. Pt requested rx be updated.

## 2022-06-14 ENCOUNTER — Telehealth: Payer: Self-pay | Admitting: Neurology

## 2022-06-14 MED ORDER — HUMULIN R U-500 KWIKPEN 500 UNIT/ML ~~LOC~~ SOPN: 110.0000 [IU] | PEN_INJECTOR | Freq: Two times a day (BID) | SUBCUTANEOUS | 1 refills | Status: DC

## 2022-06-14 NOTE — Addendum Note (Signed)
Addended by: Lauralyn Primes on: 06/14/2022 04:44 PM   Modules accepted: Orders

## 2022-06-14 NOTE — Telephone Encounter (Signed)
Will send Taron a message to check status of PA. Last update states PA is pending.

## 2022-06-14 NOTE — Telephone Encounter (Signed)
Pt called in stating [facility] is getting a denial for his MRI and they say our office isn't giving them the correct information to get the insurance approved.

## 2022-06-14 NOTE — Telephone Encounter (Signed)
Per Drusilla Kanner at [facility]: Not sure what or why but office notes were never submitted. I just resubmitted the auth and uploaded last visit notes. Pending case #[id number]. My apologies, will keep you posted.

## 2022-06-25 ENCOUNTER — Other Ambulatory Visit: Payer: Self-pay

## 2022-06-27 ENCOUNTER — Ambulatory Visit (INDEPENDENT_AMBULATORY_CARE_PROVIDER_SITE_OTHER): Payer: Commercial Managed Care - HMO | Admitting: Nurse Practitioner

## 2022-06-27 ENCOUNTER — Encounter: Payer: Self-pay | Admitting: Nurse Practitioner

## 2022-06-27 VITALS — BP 117/60 | HR 70 | Ht 74.0 in | Wt 257.4 lb

## 2022-06-27 DIAGNOSIS — M064 Inflammatory polyarthropathy: Secondary | ICD-10-CM | POA: Diagnosis not present

## 2022-06-27 DIAGNOSIS — I10 Essential (primary) hypertension: Secondary | ICD-10-CM

## 2022-06-27 DIAGNOSIS — J449 Chronic obstructive pulmonary disease, unspecified: Secondary | ICD-10-CM

## 2022-06-27 DIAGNOSIS — R229 Localized swelling, mass and lump, unspecified: Secondary | ICD-10-CM | POA: Diagnosis not present

## 2022-06-27 DIAGNOSIS — I4811 Longstanding persistent atrial fibrillation: Secondary | ICD-10-CM

## 2022-06-27 NOTE — Progress Notes (Signed)
Established patient visit   Patient: [name]   DOB: January 03, 1956   67 y.o. Male  MRN: [id number] Visit Date: 06/27/2022  Chief Complaint  Patient presents with   Pain   Subjective    HPI  Acute visit  -lump on left side of chest.  -has been present for about 3 weeks.  -mild tenderness in area of concern.  -no injury or trauma occurred.   Medications: Outpatient Medications Prior to Visit  Medication Sig   albuterol (PROAIR HFA) 108 (90 Base) MCG/ACT inhaler Inhale 1-2 puffs into the lungs every 6 (six) hours as needed for wheezing or shortness of breath.   albuterol (PROVENTIL) (2.5 MG/3ML) 0.083% nebulizer solution Take 3 mLs (2.5 mg total) by nebulization every 6 (six) hours as needed for wheezing or shortness of breath.   b complex vitamins tablet Take 1 tablet by mouth daily.    bisoprolol (ZEBETA) 10 MG tablet Take 10 mg by mouth daily.    colchicine 0.6 MG tablet TAKE 2 TABLETS BY MOUTH AT ONCE. THEN TAKE 1 TABLET 1 HOUR LATER. WAIT 2 DAYS, THEN BEGIN TAKING 1 TABLET DAILY TO PREVENT ACUTE GOUT.   Continuous Blood Gluc Sensor (DEXCOM G7 SENSOR) MISC 3 each by Does not apply route every 30 (thirty) days. Apply 1 sensor every 10 days   cyclobenzaprine (FLEXERIL) 10 MG tablet Take 10 mg by mouth 3 (three) times daily. (Patient not taking: Reported on 07/06/2022)   diltiazem (CARDIZEM CD) 120 MG 24 hr capsule Take 1 capsule (120 mg total) by mouth daily.   Dulaglutide (TRULICITY) 3 JA/2.5KN SOPN Inject 3 mg as directed once a week.   fluticasone-salmeterol (ADVAIR) 100-50 MCG/ACT AEPB Inhale 1 puff into the lungs 2 (two) times daily.   gabapentin (NEURONTIN) 300 MG capsule TAKE 3 CAPSULES BY MOUTH TWICE DAILY FOR NEUROPATHY   Glucagon 3 MG/DOSE POWD Place 3 mg into the nose once as needed for up to 1 dose.   Insulin Pen Needle (EASY COMFORT PEN NEEDLES) 31G X 5 MM MISC To use with as needed insulin dosing up to four times daily   ketoconazole (NIZORAL) 2 % cream Apply 1  Application topically daily.   ketoconazole (NIZORAL) 2 % shampoo Apply 1 Application topically 2 (two) times a week. 2x/wk, let sit 5 minutes and rinse off, wash from scalp to waist, avoid eyes   losartan (COZAAR) 50 MG tablet TAKE 1 TABLET BY MOUTH DAILY   metolazone (ZAROXOLYN) 2.5 MG tablet TAKE 1 TABLET BY MOUTH ON TUESDAY AND THURSDAY 30 MINUTES BEFORE TAKING TORSEMIDE OR AS DIRECTED   mupirocin ointment (BACTROBAN) 2 % Ply small amount to effected area twice daily for 10 days.   nitroGLYCERIN (NITROSTAT) 0.4 MG SL tablet Place 1 tablet (0.4 mg total) under the tongue every 5 (five) minutes x 3 doses as needed for chest pain.   omeprazole (PRILOSEC) 20 MG capsule TAKE 1 CAPSULE BY MOUTH TWICE DAILY   oxyCODONE-acetaminophen (PERCOCET) 10-325 MG tablet Take 1.5 tablets by mouth in the morning, at noon, in the evening, and at bedtime.   pravastatin (PRAVACHOL) 80 MG tablet TAKE 1 TABLET BY MOUTH DAILY AT BEDTIME (Patient not taking: Reported on 07/06/2022)   sulfamethoxazole-trimethoprim (BACTRIM DS) 800-160 MG tablet Take 1 tablet by mouth 2 (two) times daily. (Patient not taking: Reported on 07/06/2022)   tamsulosin (FLOMAX) 0.4 MG CAPS capsule TAKE 1 CAPSULE BY MOUTH EACH NIGHT AT BEDTIME   torsemide (DEMADEX) 20 MG tablet Take 2 tablets in the  am and 1 tablet in the pm   triamcinolone cream (KENALOG) 0.1 % Apply 1 Application topically daily as needed.   umeclidinium bromide (INCRUSE ELLIPTA) 62.5 MCG/ACT AEPB Inhale 1 puff into the lungs daily.   VITAMIN A PO Take 2,400 mcg by mouth daily.    Vitamin D, Ergocalciferol, (DRISDOL) 1.25 MG (50000 UNIT) CAPS capsule TAKE 1 CAPSULE BY MOUTH EVERY 7 DAYS   warfarin (COUMADIN) 5 MG tablet Take 1/2 to 1 tablet daily or as directed   [DISCONTINUED] insulin regular human CONCENTRATED (HUMULIN R U-500 KWIKPEN) 500 UNIT/ML KwikPen Inject 110 Units into the skin 2 (two) times daily with a meal. Use up to 200 units of insulin daily as advised (Patient  taking differently: Inject 120 Units into the skin 2 (two) times daily with a meal. Use up to 240 units of insulin daily as advised)   [DISCONTINUED] potassium chloride SA (KLOR-CON M) 20 MEQ tablet TAKE 1 TABLET BY MOUTH DAILY. TAKE AN EXTRA TABLET ON TUESDAY AND THURSDAY   [DISCONTINUED] primidone (MYSOLINE) 50 MG tablet Take 1 tablet (50 mg total) by mouth in the morning and at bedtime. Take 1/2 tablet (25 mg) for 1 week, then take 1 tablet (50 mg) for 1 week, then take 1 tablet (50 mg) twice daily (morning and night) thereafter.   No facility-administered medications prior to visit.    Review of Systems  Musculoskeletal:        Feels palpable lump on left side of chest. Slightly tender with palpation.   All other systems reviewed and are negative.    Objective     Today's Vitals   06/27/22 1112 06/27/22 1139  BP: (Abnormal) 143/77 117/60  Pulse: (Abnormal) 119 70  SpO2: 96%   Weight: 257 lb 6.4 oz (116.8 kg)   Height: 6\' 2"  (1.88 m)    Body mass index is 33.05 kg/m.   Physical Exam Vitals and nursing note reviewed.  Constitutional:      Appearance: Normal appearance. He is well-developed.  HENT:     Head: Normocephalic and atraumatic.     Mouth/Throat:     Mouth: Mucous membranes are moist.     Pharynx: Oropharynx is clear.  Eyes:     Extraocular Movements: Extraocular movements intact.     Conjunctiva/sclera: Conjunctivae normal.     Pupils: Pupils are equal, round, and reactive to light.  Cardiovascular:     Rate and Rhythm: Normal rate and regular rhythm.     Pulses: Normal pulses.     Heart sounds: Normal heart sounds.  Pulmonary:     Effort: Pulmonary effort is normal.     Breath sounds: Wheezing and rhonchi present.  Chest:    Abdominal:     Palpations: Abdomen is soft.  Musculoskeletal:        General: Normal range of motion.     Cervical back: Normal range of motion and neck supple.     Comments: Generalized joint pain and weakness.    Lymphadenopathy:     Cervical: No cervical adenopathy.  Skin:    General: Skin is warm and dry.     Capillary Refill: Capillary refill takes less than 2 seconds.  Neurological:     General: No focal deficit present.     Mental Status: He is alert and oriented to person, place, and time.  Psychiatric:        Mood and Affect: Mood normal.        Behavior: Behavior normal.  Thought Content: Thought content normal.        Judgment: Judgment normal.      Assessment & Plan     1. Localized superficial mass Sub centimeter, superficial, nodular density of left medial, upper chest. Will monitor. Reassess at next scheduled visit in one month.   2. Longstanding persistent atrial fibrillation (HCC) Stable. Continue regular visits with cardiology   3. Essential hypertension Blood pressure stable. Continue bp medication  as prescribed   4. Inflammatory polyarthropathy ([place]) Continue regular visits with pain management/orthopedics providers as scheduled.   5. Moderate COPD (chronic obstructive pulmonary disease) (HCC) Use inhalers and respiratory  medications as prescribed. Follow up with pulmonology as scheduled.    Return for as scheduled.        Ronnell Freshwater, NP  [facility] [phone] (phone) [phone] (fax)  [facility]

## 2022-06-28 ENCOUNTER — Ambulatory Visit: Payer: Commercial Managed Care - HMO | Admitting: Internal Medicine

## 2022-06-28 ENCOUNTER — Encounter: Payer: Self-pay | Admitting: Internal Medicine

## 2022-06-28 VITALS — BP 110/62 | HR 65 | Ht 74.0 in | Wt 256.8 lb

## 2022-06-28 DIAGNOSIS — E1122 Type 2 diabetes mellitus with diabetic chronic kidney disease: Secondary | ICD-10-CM

## 2022-06-28 DIAGNOSIS — E785 Hyperlipidemia, unspecified: Secondary | ICD-10-CM

## 2022-06-28 DIAGNOSIS — Z794 Long term (current) use of insulin: Secondary | ICD-10-CM | POA: Diagnosis not present

## 2022-06-28 DIAGNOSIS — N1831 Chronic kidney disease, stage 3a: Secondary | ICD-10-CM

## 2022-06-28 LAB — POCT GLYCOSYLATED HEMOGLOBIN (HGB A1C): Hemoglobin A1C: 7.6 % — AB (ref 4.0–5.6)

## 2022-06-28 MED ORDER — HUMULIN R U-500 KWIKPEN 500 UNIT/ML ~~LOC~~ SOPN: PEN_INJECTOR | SUBCUTANEOUS | 3 refills | Status: DC

## 2022-06-28 NOTE — Addendum Note (Signed)
Addended by: Lauralyn Primes on: 06/28/2022 11:09 AM   Modules accepted: Orders
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
## 2022-06-28 NOTE — Patient Instructions (Addendum)
Please continue: - Trulicity 3 mg weekly  Please change: - U500 insulin  120-140 units of insulin in the morning   90-110 units in the evening  Please return in 2 months.

## 2022-06-29 NOTE — Progress Notes (Signed)
I saw [name] in neurology clinic on 07/06/22 in follow up for tremor and weakness.  HPI: [name] is a 67 y.o. 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  To briefly review: Patient started noticing symptoms 2-2.5 years ago. He relates that his symptoms started having muscle loss, weakness, and tremors. He states prior to this, he was strong. He still tries to stay active, but his muscle loss has slowed him down. He has tingling and numbness in bilateral legs and hands.   He uses a cane to ambulate mostly, but sometimes also uses a walker. He falls often. He mentions that when he is just standing, he tends to get off balance. He falls about once per month. If he is walking, he does not have problems. If he tries to look up or down or turns, that is most likely time to fall. He does occasionally freeze when walking.   Patient mentions he has had a tremor in his left hand since 1998 after his cervical spine fusion. He mentions now that he has tremors all over. His body will jump and almost throw him out of a chair. He mentions that when he is more nervous his tremors worsen.   He relates a long history of neck and back problems. He mentions lower back pain since the age of 17. He also states his cervical spine is "worn out."    The patient denies symptoms suggestive of oculobulbar weakness including diplopia, ptosis, poor saliva control, dysarthria/dysphonia, impaired mastication, facial weakness/droop.    He does sometimes have difficulty getting food down and things will get stuck. Sometimes he has to swallow liquids twice. Per patient, he has something growing in his neck. I do not see notes about this.   Patient has COPD and chronic shortness of breath and has had difficulty laying on  his back due to shortness of breath for about 25 years.   Pseudobulbar affect is absent.   Patient has lost 15-20 pounds over the last 2 years without trying. He has put some weight back on lately.   Patient takes gabapentin 900 mg BID for neuropathy. The pain in his legs can be very severe, especially at night.   Of note, patient is on warfarin for afib.   Patient was on pravastatin but stopped recently due to weakness. He has been off of it for 1-2 weeks. He thinks it may have helped, but is not sure.   Patient was sent to PT. He went once in the summer of 2023. He stopped due to pain.   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 Patient thinks his tremor would go away years ago after drinking. Restrictive diet? No Family history of neuropathy/myopathy/NM disease? Son has tremors  Most recent Assessment and Plan (03/31/22): His neurological examination is pertinent for distal weakness in bilateral upper extremities and weakness of hip flexion in lower extremities. He has diffuse sensory deficits, most pronounced in lower extremities and hyperreflexia. He also has a tremor at rest in hands (left > right) that worsens with movement. 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 HbA1c was 7.7.  This is a complex case. Patient's symptoms are likely multifactorial. Tremor - Likely essential tremor. His son also has similar symptoms (often runs in families). He also remembers the tremor would go away after he would drink (when he previously drank heavily). There is no clear parkinsonism to suggest a neurodegenerative process. Numbness, tingling, weakness, ?atrophy - Patient has known severe polyneuropathy, with known risk factors of previous heavy EtOH use and diabetes. He also has known severe cervical and more moderate lumbar spine  disease. This is likely contributing to symptoms as well. A more sinister process such as motor neuron disease is possible with symptoms of weakness and atrophy, but I did not see significant atrophy on exam. Fasciculations are difficult to assess for in this patient as his tremors were significant, but there were no clear fasciculations today. Myopathy is also possible, particularly given his possible proximal leg weakness and previous statin use.    Under normal circumstances, I would complete an EMG to evaluate for these possibilities. However, with known severe neuropathy, spine disease, and anticoagulation use, I am not sure I would be able to extensively test as needed to get meaningful results. I will start with lab work and repeat MRI of cervical spine and monitor symptoms. If patient is worsening over the coming months, I will reconsider EMG. We discussed therapy, but patient feels he is in too much pain to complete currently.   PLAN: -Blood work: B1, CK, IFE, SPEP -MRI cervical spine wo contrast (open machine requested) -Start Primidone 25 mg for 1 week, then 50 mg daily for 1 week, then 50 mg BID  Since their last visit: Blood work was significant for low B1. I recommended supplementation with 100 mg daily on 04/11/22. He takes "B vitamin" but is not sure if he is taking B1.  He continues to have a lot a pain. He has pain in neck, back, and legs. The majority of his pain is in his back. He has seen spine surgery in the past and was told there was nothing they could do as his heart doctor would not clear him. I recommended MRI cervical spine at last visit, but patient has not gotten it. He said he tried to call but no one responded to his calls about this.  He feels weaker and like he may be headed toward a wheelchair. He just got a new shower that will have a seat and bars. He is getting this installed soon.  His tremor is about the same as prior. He was unable to the primidone as it would  make him sleep too much.  He still has difficulty occasionally with swallowing. He has seen ENT in the past and was told he had a "worm like" thing in throat that hangs up his food. He does not have much difficulty with liquids. He has not had a swallow evaluation.   MEDICATIONS:  Outpatient Encounter Medications as of 07/06/2022  Medication Sig   albuterol (PROAIR HFA) 108 (90 Base) MCG/ACT inhaler Inhale 1-2 puffs into the lungs every 6 (six) hours as needed for wheezing or shortness of breath.   albuterol (PROVENTIL) (2.5 MG/3ML) 0.083% nebulizer solution Take 3 mLs (2.5 mg total) by nebulization every 6 (six) hours as needed for wheezing or shortness of breath.   b complex vitamins tablet Take 1 tablet by mouth daily.    bisoprolol (ZEBETA) 10 MG tablet Take 10 mg by mouth daily.    colchicine 0.6 MG tablet TAKE 2 TABLETS BY MOUTH  AT ONCE. THEN TAKE 1 TABLET 1 HOUR LATER. WAIT 2 DAYS, THEN BEGIN TAKING 1 TABLET DAILY TO PREVENT ACUTE GOUT.   Continuous Blood Gluc Sensor (DEXCOM G7 SENSOR) MISC 3 each by Does not apply route every 30 (thirty) days. Apply 1 sensor every 10 days   diltiazem (CARDIZEM CD) 120 MG 24 hr capsule Take 1 capsule (120 mg total) by mouth daily.   Dulaglutide (TRULICITY) 3 0000000 SOPN Inject 3 mg as directed once a week.   fluticasone-salmeterol (ADVAIR) 100-50 MCG/ACT AEPB Inhale 1 puff into the lungs 2 (two) times daily.   gabapentin (NEURONTIN) 300 MG capsule TAKE 3 CAPSULES BY MOUTH TWICE DAILY FOR NEUROPATHY   Glucagon 3 MG/DOSE POWD Place 3 mg into the nose once as needed for up to 1 dose.   Insulin Pen Needle (EASY COMFORT PEN NEEDLES) 31G X 5 MM MISC To use with as needed insulin dosing up to four times daily   insulin regular human CONCENTRATED (HUMULIN R U-500 KWIKPEN) 500 UNIT/ML KwikPen Inject 260 Units into the skin daily. Use up to 250-260 units of insulin daily as advised   ketoconazole (NIZORAL) 2 % cream Apply 1 Application topically daily.    ketoconazole (NIZORAL) 2 % shampoo Apply 1 Application topically 2 (two) times a week. 2x/wk, let sit 5 minutes and rinse off, wash from scalp to waist, avoid eyes   losartan (COZAAR) 50 MG tablet TAKE 1 TABLET BY MOUTH DAILY   metolazone (ZAROXOLYN) 2.5 MG tablet TAKE 1 TABLET BY MOUTH ON TUESDAY AND THURSDAY 30 MINUTES BEFORE TAKING TORSEMIDE OR AS DIRECTED   mupirocin ointment (BACTROBAN) 2 % Ply small amount to effected area twice daily for 10 days.   nitroGLYCERIN (NITROSTAT) 0.4 MG SL tablet Place 1 tablet (0.4 mg total) under the tongue every 5 (five) minutes x 3 doses as needed for chest pain.   omeprazole (PRILOSEC) 20 MG capsule TAKE 1 CAPSULE BY MOUTH TWICE DAILY   oxyCODONE-acetaminophen (PERCOCET) 10-325 MG tablet Take 1.5 tablets by mouth in the morning, at noon, in the evening, and at bedtime.   potassium chloride SA (KLOR-CON M) 20 MEQ tablet TAKE 1 TABLET BY MOUTH DAILY. TAKE AN EXTRA TABLET ON TUESDAY AND THURSDAY   tamsulosin (FLOMAX) 0.4 MG CAPS capsule TAKE 1 CAPSULE BY MOUTH EACH NIGHT AT BEDTIME   torsemide (DEMADEX) 20 MG tablet Take 2 tablets in the am and 1 tablet in the pm   triamcinolone cream (KENALOG) 0.1 % Apply 1 Application topically daily as needed.   umeclidinium bromide (INCRUSE ELLIPTA) 62.5 MCG/ACT AEPB Inhale 1 puff into the lungs daily.   VITAMIN A PO Take 2,400 mcg by mouth daily.    Vitamin D, Ergocalciferol, (DRISDOL) 1.25 MG (50000 UNIT) CAPS capsule TAKE 1 CAPSULE BY MOUTH EVERY 7 DAYS   warfarin (COUMADIN) 5 MG tablet Take 1/2 to 1 tablet daily or as directed   [DISCONTINUED] primidone (MYSOLINE) 50 MG tablet Take 1 tablet (50 mg total) by mouth in the morning and at bedtime. Take 1/2 tablet (25 mg) for 1 week, then take 1 tablet (50 mg) for 1 week, then take 1 tablet (50 mg) twice daily (morning and night) thereafter.   cyclobenzaprine (FLEXERIL) 10 MG tablet Take 10 mg by mouth 3 (three) times daily. (Patient not taking: Reported on 07/06/2022)    pravastatin (PRAVACHOL) 80 MG tablet TAKE 1 TABLET BY MOUTH DAILY AT BEDTIME (Patient not taking: Reported on 07/06/2022)   sulfamethoxazole-trimethoprim (BACTRIM DS) 800-160 MG tablet Take 1  tablet by mouth 2 (two) times daily. (Patient not taking: Reported on 07/06/2022)   [DISCONTINUED] insulin regular human CONCENTRATED (HUMULIN R U-500 KWIKPEN) 500 UNIT/ML KwikPen Use up to 250-260 units of insulin daily as advised   No facility-administered encounter medications on file as of 07/06/2022.    PAST MEDICAL HISTORY: Past Medical History:  Diagnosis Date   Actinic keratosis    Arthritis    lower back   Asthma    Atrial fibrillation [facility])    Atrial flutter ([place])    s/p CTI ablation by Dr Rayann Heman   Brain aneurysm 2009   questionable. A follow up CTA in 2009 showed no evidence of   Chronic back pain    DDD/stenosis   Colon polyps    9 polyps removed [id number]   Complication of anesthesia 09/11/2012   slow to awaken after ablation   COPD (chronic obstructive pulmonary disease) ([place])    Diabetes mellitus    takes Metformin and Glimepiride daily   Emphysema    GERD (gastroesophageal reflux disease)    takes Omeprazole bid   Heart failure ([place])    History of shingles    HLD (hyperlipidemia)    takes Pravastatin daily   HTN (hypertension)    takes Prinizide daily   Obesity    OSA (obstructive sleep apnea)    not always using cpap   Overdose 2009   unintentional Flecanide overdose   Peripheral neuropathy    Short-term memory loss    Tobacco abuse     PAST SURGICAL HISTORY: Past Surgical History:  Procedure Laterality Date   ANTERIOR CERVICAL DECOMP/DISCECTOMY FUSION  01/04/2012   Procedure: ANTERIOR CERVICAL DECOMPRESSION/DISCECTOMY FUSION 1 LEVEL/HARDWARE REMOVAL;  Surgeon: Ophelia Charter, MD;  Location: [facility];  Service: Neurosurgery;  Laterality: N/A;  explore cervical fusion Cervical six - seven  with removal of codman plate anterior cervical decompression with fusion  interbody prothesis plating and bonegraft   [place]  2[place]95yr [place]   [place]   PT DID NOT HAVE AN ATRIAL FIBRILLATION ABLATION IN 2014!  ATRIAL FLUTTER ABLATION ONLY   ATRIAL FLUTTER ABLATION  09/11/2012   CTI ablation by Dr ARayann Heman  BIOPSY  11/07/2019   Procedure: BIOPSY;  Surgeon: JMilus Banister MD;  [facility]: [facility];  Service: Endoscopy;;   CARDIAC CATHETERIZATION  2008   no significant CAD   CARDIOVERSION  05/05/2011   Procedure: CARDIOVERSION;  Surgeon: BLelon Perla MD;  [place]  Service: Cardiovascular;  Laterality: N/A;   CARDIOVERSION Bilateral 07/26/2012   Procedure: CARDIOVERSION;  Surgeon: JMinus Breeding MD;  [facility];  Service: Cardiovascular;  Laterality: Bilateral;   CARPAL TUNNEL RELEASE  99/2000   bilateral   COLONOSCOPY WITH PROPOFOL N/A 12/13/2012   Procedure: COLONOSCOPY WITH PROPOFOL;  Surgeon: DMilus Banister MD;  Location: [facility];  Service: Endoscopy;  Laterality: N/A;   ELECTROPHYSIOLOGIC STUDY N/A 04/21/2015   Procedure: Atrial Fibrillation Ablation;  Surgeon: JThompson Grayer MD;  [place];  Service: Cardiovascular;  Laterality: N/A;   ESOPHAGOGASTRODUODENOSCOPY (EGD) WITH PROPOFOL N/A 11/07/2019   Procedure: ESOPHAGOGASTRODUODENOSCOPY (EGD) WITH PROPOFOL;  Surgeon: JMilus Banister MD;  [facility];  Service: Endoscopy;  Laterality: N/A;   HERNIA REPAIR     KNEE ARTHROSCOPY WITH MEDIAL MENISECTOMY Left 04/23/2021   Procedure: LEFT KNEE ARTHROSCOPY WITH PARTIAL MEDIAL MENISCECTOMY;  Surgeon: YMarybelle Killings MD;  [place]  Service: Orthopedics;  Laterality: Left;  KNEE SURGERY  6-68yr ago   left   LEFT HEART CATH AND CORONARY ANGIOGRAPHY N/A 09/19/2016   Procedure: Left Heart Cath and Coronary Angiography;  Surgeon: SBelva Crome MD;  [facility];  Service: Cardiovascular;  Laterality: N/A;   Left inguinal hernia repair     as a  child   NASAL SEPTOPLASTY W/ TURBINOPLASTY Bilateral 02/19/2014   Procedure: NASAL SEPTOPLASTY WITH BILATERAL TURBINATE REDUCTION;  Surgeon: KJodi Marble MD;  [place]  Service: ENT;  Laterality: Bilateral;   NECK SURGERY  124yrago   right shoulder surgery  [id number]   cyst removed   TEE WITHOUT CARDIOVERSION  05/05/2011   Procedure: TRANSESOPHAGEAL ECHOCARDIOGRAM (TEE);  Surgeon: BriLelon PerlaD;  [facility];  Service: Cardiovascular;  Laterality: N/A;   TEE WITHOUT CARDIOVERSION N/A 04/21/2015   Procedure: TRANSESOPHAGEAL ECHOCARDIOGRAM (TEE);  Surgeon: TraSueanne MargaritaD;  [facility];  Service: Cardiovascular;  Laterality: N/A;   THROAT SURGERY  [id number]   "thought " it was cancer but came back not   TONSILLECTOMY      ALLERGIES: Allergies  Allergen Reactions   Adhesive [Tape]     itching   Latex Itching    When tape is on the skin too long skin gets red & itching    FAMILY HISTORY: Family History  Problem Relation Age of Onset   Diabetes Mother    Heart disease Father    Lung cancer Father    Diabetes Maternal Grandmother    Colon cancer Neg Hx    Anesthesia problems Neg Hx    Hypotension Neg Hx    Malignant hyperthermia Neg Hx    Pseudochol deficiency Neg Hx    Esophageal cancer Neg Hx    Pancreatic cancer Neg Hx    Stomach cancer Neg Hx     SOCIAL HISTORY: Social History   Tobacco Use   Smoking status: Every Day    Packs/day: 1.00    Years: 49.00    Total pack years: 49.00    Types: Cigarettes   Smokeless tobacco: Never   Tobacco comments:    Smoking almost a pack per day 03/08/2022 hfb  Vaping Use   Vaping Use: Never used  Substance Use Topics   Alcohol use: Not Currently    Alcohol/week: 0.0 standard drinks of alcohol    Comment: 12/05/17 pt stated he has drinked 1/2 drink in 3 years   Drug use: No   Social History   Social History Narrative   Daily caffeine(Mountain Dew) [place].Unemployed due to chronic back/ leg pain         Are you right handed or left handed? Left Handed    Are you currently employed ? No    What is your current occupation? No   Do you live at home alone? no   Who lives with you? Lives with wife and 3 doggies   What type of home do you live in: 1 story or 2 story? Lives in one story home        Objective:  Vital Signs:  BP 132/72   Pulse 70   Ht 6' 2"$  (1.88 m)   Wt 257 lb (116.6 kg)   SpO2 98%   BMI 33.00 kg/m   General: General appearance: Awake and alert. No distress. Cooperative with exam.  Skin: No obvious rash or jaundice. HEENT: Atraumatic. Anicteric. Lungs: Non-labored breathing on room air  Extremities: Very dry skin, various scars/lesions seen on legs/feet.   Neurological: Mental Status: Alert. Speech fluent. No pseudobulbar affect Cranial Nerves: CNII: No RAPD. Visual fields intact. CNIII, IV, VI: PERRL. No nystagmus. EOMI. CN V: Facial sensation intact bilaterally to fine touch. Jaw jerk negative. CN VII: Facial muscles symmetric and strong. No ptosis at rest. CN VIII: Hears finger rub well bilaterally. CN IX: No hypophonia. CN X: Palate elevates symmetrically. CN XI: Full strength shoulder shrug bilaterally. CN XII: Tongue protrusion full and midline. No atrophy or fasciculations. No significant dysarthria Motor: Tone is increased. Bilateral hand tremor (left > right) and in chin. No obvious fasciculations in extremities. No obvious atrophy.   Individual muscle group testing (MRC grade out of 5):  Movement     Neck flexion 5    Neck extension 5     Right Left   Shoulder abduction 5 5   Elbow flexion 5 5   Elbow extension 5 5   Finger abduction - FDI 5- 5-   Finger abduction - ADM 5- 5-   Finger extension 5 5   Finger flexion 5 5    Hip flexion 3 3 Limited by pain  Hip extension 5 5   Hip adduction 5 5   Hip abduction 5 5   Knee extension 5 5   Knee flexion 5 5   Dorsiflexion 5 5    Plantarflexion 5 5     Reflexes:  Right Left  Bicep Trace Trace  Tricep Trace Trace  BrRad Trace Trace  Knee 0 0  Ankle 0 0   Pathological Reflexes: Babinski: flexor response bilaterally Hoffman: absent bilaterally Troemner: absent bilaterally Sensation: Pinprick: Absent in bilateral lower extremities to knees Vibration: Absent in feet, diminished at hands and knees Proprioception: Absent in bilateral great toes Coordination: Intact finger-to- nose-finger  with significant action bilateral (left > right) Gait: Antalgic gait. Stooped posture.  Lab and Test Review: New results: 03/31/22: IFE and SPEP with no M protein CK: 31 B1: < 6  HbA1c (06/28/22): 7.6  Previously reviewed results: Internal labs: Normal or unremarkable: CRP, B12 (841), folate, RF, TSH ANA negative on 08/12/21, positive on 10/22/20, negative on 12/26/17 INR (03/24/22): 2.2 CBC (03/01/22): significant for anemia (Hb 11.2), thrombocytopenia (141) CMP (03/01/22): significant for elevated glucose, elevated alk phos (121), Cr 1.5 ESR (03/01/22): elevated to 67 HbA1c (11/30/21): 7.7   EMG (08/25/20, Dr. Ernestina Patches): EMG & NCV Findings: Evaluation of the left fibular motor nerve showed prolonged distal onset latency (12.2 ms) and reduced amplitude (1.9 mV).  The left tibial motor nerve showed prolonged distal onset latency (6.2 ms), reduced amplitude (0.6 mV), and decreased conduction velocity (Knee-Ankle, 31 m/s).  The left saphenous sensory nerve showed prolonged distal peak latency (8.1 ms).  The left superficial fibular sensory nerve showed no response (14 cm).  The left sural sensory nerve showed no response (Calf).     Needle evaluation of the left anterior tibialis and the left Fibularis Longus muscles showed increased insertional activity, increased spontaneous activity, and diminished recruitment.  The left medial gastrocnemius muscle showed increased insertional activity, moderately increased spontaneous  activity, and diminished recruitment.  All remaining muscles (as indicated in the following table) showed no evidence of electrical instability.     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  He carries a diagnosis of polyneuropathy but has not had electrodiagnostic studies.  For proven full characterization of the neuropathy referral to a neurologist for a full 3 limb study could be performed.  The needle EMG findings could be consistent with a lumbar stenosis but typically the level of denervation seen in motor unit changes would be a fairly severe stenosis and his latest imaging shows moderate narrowing.  He could be getting more pain from the spine on the left more than right but that is not confirmed or elucidated with this testing.   There is no significant electrodiagnostic evidence of any other focal nerve entrapment or lumbosacral plexopathy.    MRI lumbar spine wo contrast (03/01/20): FINDINGS: Segmentation:  Standard.   Alignment:  Straightening of lordosis.  Grade 1 L3-4 retrolisthesis.   Vertebrae: Multilevel Modic type 2 endplate degenerative changes. Multilevel Schmorl's node formation. Scattered hemangiomata versus focal fat.   Conus medullaris and cauda equina: Conus extends to the L1 level. Conus and cauda equina appear normal.   Disc levels: Multilevel desiccation disc space loss most prominent at the L3-4 and L5-S1 levels.   L1-2: Disc bulge with small central protrusion, ligamentum flavum and bilateral facet hypertrophy. Mild spinal canal and bilateral neural foraminal narrowing.   L2-3: Disc bulge, ligamentum flavum and bilateral facet hypertrophy. Mild spinal canal, moderate right and mild left neural foraminal narrowing.   L3-4: Disc bulge abutting the ventral thecal sac with prominent subarticular  components effacing the lateral recess. There is abutment of the exiting L3 and descending L4 nerve roots. Bilateral facet hypertrophy. Moderate spinal canal and bilateral neural foraminal narrowing, unchanged.   L4-5: Disc bulge and bilateral facet hypertrophy. Small left foraminal protrusion grazing the exiting L4 nerve root. Patent spinal canal. Moderate bilateral neural foraminal narrowing.   L5-S1: Bilateral pars defects. Disc bulge with superimposed central protrusion/annular fissuring. Bilateral facet hypertrophy. Patent spinal canal. Mild bilateral neural foraminal narrowing.   Paraspinal and other soft tissues: Bilateral renal cysts.   IMPRESSION: Multilevel spondylosis, grossly unchanged.   Moderate L3-4 and mild L1-3 spinal canal narrowing.   Moderate right L2-3 and bilateral L3-5 neural foraminal narrowing.   Mild bilateral L1-2, L5-S1 neural foraminal narrowing.   MRI cervical spine (10/31/11): Findings: Scan extends from the mid clivus through T1-2. Normal  paraspinal soft tissues. Visualized intracranial contents are  normal.   The patient has a solid anterior cervical fusion from C4-C6.  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  The cervical spinal cord  is otherwise normal.   C1-2 and C2-3:  Normal.   C3-4:  Small slightly more prominent broad-based disc bulge with  prominent uncinate spurring to the right and left with bilateral  foraminal narrowing, unchanged.   C4-5 and C5-6:  Solid anterior fusion with no residual impingement.  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   C6-7:  Large new soft disc protrusion and extrusion into the left  lateral recess and left neural foramen affecting the left C7 nerve.  The extruded material measures 12 x 11 x 9 mm.  It touches the left  side of the spinal cord in the left lateral recess but does not  compress it.   C7-T1 and  T1-2:  Normal.   IMPRESSION:  1. Large soft disc extrusion and protrusion into the left neural  foramen and left lateral recess at C6-7 compressing the left C7  nerve.  2. 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  3.  Slight increased broad-based disc bulge at C3-4 without focal  neural impingement.   ASSESSMENT: This is [name], a 67 y.o. male with:  Numbness, tingling, pain, and weakness - These symptoms are likely multifactorial with contributions from arthritis, cervical and lumbar spine disease, and polyneuropathy. His known risk factors for PN are DM, EtOH abuse, and B1 deficiency. Essential tremor - Patient did not take primidone long because he felt sleepy on it. He is already on gabapentin and propranolol would not be a good option given his other cardiac medications. He is willing to retry primidone and give it more time. Dysphagia - reported by patient. Unclear etiology, but per patient was told his "uvula" was causing problems getting food stuck. Per GI note from 03/01/22, previous EGD in 10/2019 showed no stricture or ring. He did not want barium swallow at that time. He is agreeable to swallow evaluation today.  Plan: -MRI cervical, thoracic, and lumbar spine wo contrast -Swallow evaluation -Physical therapy consult -Continue gabapentin 900 mg BID -Lidocaine cream PRN -B1 (thiamine) 100 mg daily -Can retry primidone 25 mg daily for essential tremor.   Return to clinic in 6 months  Total time spent reviewing records, interview, history/exam, documentation, and coordination of care on day of encounter:  40 min  Kai Levins, MD

## 2022-06-30 ENCOUNTER — Other Ambulatory Visit: Payer: Self-pay

## 2022-06-30 DIAGNOSIS — Z122 Encounter for screening for malignant neoplasm of respiratory organs: Secondary | ICD-10-CM

## 2022-06-30 DIAGNOSIS — Z87891 Personal history of nicotine dependence: Secondary | ICD-10-CM

## 2022-07-04 ENCOUNTER — Telehealth: Payer: Self-pay

## 2022-07-04 ENCOUNTER — Other Ambulatory Visit (HOSPITAL_COMMUNITY): Payer: Self-pay

## 2022-07-04 DIAGNOSIS — E1122 Type 2 diabetes mellitus with diabetic chronic kidney disease: Secondary | ICD-10-CM

## 2022-07-04 MED ORDER — HUMULIN R U-500 KWIKPEN 500 UNIT/ML ~~LOC~~ SOPN: 260.0000 [IU] | PEN_INJECTOR | Freq: Every day | SUBCUTANEOUS | 1 refills | Status: DC

## 2022-07-04 NOTE — Telephone Encounter (Signed)
Pt called to advise PA needed for Humulin U-500 insulin. Message sent to the PA team high priority.

## 2022-07-04 NOTE — Telephone Encounter (Signed)
Advised per PA team: Per test claim, pt filled 07/01/22, no PA needed. Spoke with the pharmacy, it's ready for pick up for $12 for a 23 day supply.  Rx updated.

## 2022-07-04 NOTE — Addendum Note (Signed)
Addended by: Lauralyn Primes on: 07/04/2022 03:31 PM   Modules accepted: Orders

## 2022-07-06 ENCOUNTER — Ambulatory Visit (INDEPENDENT_AMBULATORY_CARE_PROVIDER_SITE_OTHER): Payer: Commercial Managed Care - HMO | Admitting: Neurology

## 2022-07-06 ENCOUNTER — Encounter: Payer: Self-pay | Admitting: Neurology

## 2022-07-06 VITALS — BP 132/72 | HR 70 | Ht 74.0 in | Wt 257.0 lb

## 2022-07-06 DIAGNOSIS — M4802 Spinal stenosis, cervical region: Secondary | ICD-10-CM | POA: Diagnosis not present

## 2022-07-06 DIAGNOSIS — R52 Pain, unspecified: Secondary | ICD-10-CM

## 2022-07-06 DIAGNOSIS — R531 Weakness: Secondary | ICD-10-CM

## 2022-07-06 DIAGNOSIS — R1314 Dysphagia, pharyngoesophageal phase: Secondary | ICD-10-CM

## 2022-07-06 DIAGNOSIS — M48062 Spinal stenosis, lumbar region with neurogenic claudication: Secondary | ICD-10-CM

## 2022-07-06 DIAGNOSIS — G250 Essential tremor: Secondary | ICD-10-CM

## 2022-07-06 DIAGNOSIS — F1011 Alcohol abuse, in remission: Secondary | ICD-10-CM

## 2022-07-06 DIAGNOSIS — M542 Cervicalgia: Secondary | ICD-10-CM

## 2022-07-06 DIAGNOSIS — E519 Thiamine deficiency, unspecified: Secondary | ICD-10-CM

## 2022-07-06 DIAGNOSIS — G629 Polyneuropathy, unspecified: Secondary | ICD-10-CM

## 2022-07-06 NOTE — Addendum Note (Signed)
Addended by: Renae Gloss on: 07/06/2022 01:24 PM   Modules accepted: Orders

## 2022-07-06 NOTE — Patient Instructions (Addendum)
I recommend you take B1 (thiamine) 100 mg every day as your B1 levels were low. Be sure you are taking this currently.  I recommend continuing gabapentin 900 mg twice daily for pain.  You can also try Lidocaine cream as needed. Apply wear you have pain, tingling, or burning. Wear gloves to prevent your hands being numb. This can be bought over the counter at any drug store or online.  I am ordering MRI of your entire spine. We could consider a second opinion from spine surgery if things are looking worse.  I want to get a swallow evaluation as well to see what problems you are having.  I am also putting a referral for physical therapy as well.  You can retry primidone 25 mg daily for tremor. Sleepiness should improve the longer you take it.  I would like to see you back in 6 months or sooner if needed.  The physicians and staff at [facility] are committed to providing excellent care. You may receive a survey requesting feedback about your experience at our office. We strive to receive "very good" responses to the survey questions. If you feel that your experience would prevent you from giving the office a "very good " response, please contact our office to try to remedy the situation. We may be reached at [phone]. Thank you for taking the time out of your busy day to complete the survey.  Kai Levins, MD [facility]  Preventing Falls at [facility] are common, often dreaded events in the lives of older people. Aside from the obvious injuries and even death that may result, fall can cause wide-ranging consequences including loss of independence, mental decline, decreased activity and mobility. Younger people are also at risk of falling, especially those with chronic illnesses and fatigue.  Ways to reduce risk for falling Examine diet and medications. Warm foods and alcohol dilate blood vessels, which can lead to dizziness when standing. Sleep aids, antidepressants and pain  medications can also increase the likelihood of a fall.  Get a vision exam. Poor vision, cataracts and glaucoma increase the chances of falling.  Check foot gear. Shoes should fit snugly and have a sturdy, nonskid sole and a broad, low heel  Participate in a physician-approved exercise program to build and maintain muscle strength and improve balance and coordination. Programs that use ankle weights or stretch bands are excellent for muscle-strengthening. Water aerobics programs and low-impact Tai Chi programs have also been shown to improve balance and coordination.  Increase vitamin D intake. Vitamin D improves muscle strength and increases the amount of calcium the body is able to absorb and deposit in bones.  How to prevent falls from common hazards Floors - Remove all loose wires, cords, and throw rugs. Minimize clutter. Make sure rugs are anchored and smooth. Keep furniture in its usual place.  Chairs -- Use chairs with straight backs, armrests and firm seats. Add firm cushions to existing pieces to add height.  Bathroom - Install grab bars and non-skid tape in the tub or shower. Use a bathtub transfer bench or a shower chair with a back support Use an elevated toilet seat and/or safety rails to assist standing from a low surface. Do not use towel racks or bathroom tissue holders to help you stand.  Lighting - Make sure halls, stairways, and entrances are well-lit. Install a night light in your bathroom or hallway. Make sure there is a light switch at the top and bottom of the staircase.  Turn lights on if you get up in the middle of the night. Make sure lamps or light switches are within reach of the bed if you have to get up during the night.  Kitchen - Install non-skid rubber mats near the sink and stove. Clean spills immediately. Store frequently used utensils, pots, pans between waist and eye level. This helps prevent reaching and bending. Sit when getting things out of lower  cupboards.  Living room/ Bedrooms - Place furniture with wide spaces in between, giving enough room to move around. Establish a route through the living room that gives you something to hold onto as you walk.  Stairs - Make sure treads, rails, and rugs are secure. Install a rail on both sides of the stairs. If stairs are a threat, it might be helpful to arrange most of your activities on the lower level to reduce the number of times you must climb the stairs.  Entrances and doorways - Install metal handles on the walls adjacent to the doorknobs of all doors to make it more secure as you travel through the doorway.  Tips for maintaining balance Keep at least one hand free at all times. Try using a backpack or fanny pack to hold things rather than carrying them in your hands. Never carry objects in both hands when walking as this interferes with keeping your balance.  Attempt to swing both arms from front to back while walking. This might require a conscious effort if Parkinson's disease has diminished your movement. It will, however, help you to maintain balance and posture, and reduce fatigue.  Consciously lift your feet off of the ground when walking. Shuffling and dragging of the feet is a common culprit in losing your balance.  When trying to navigate turns, use a "U" technique of facing forward and making a wide turn, rather than pivoting sharply.  Try to stand with your feet shoulder-length apart. When your feet are close together for any length of time, you increase your risk of losing your balance and falling.  Do one thing at a time. Don't try to walk and accomplish another task, such as reading or looking around. The decrease in your automatic reflexes complicates motor function, so the less distraction, the better.  Do not wear rubber or gripping soled shoes, they might "catch" on the floor and cause tripping.  Move slowly when changing positions. Use deliberate, concentrated  movements and, if needed, use a grab bar or walking aid. Count 15 seconds between each movement. For example, when rising from a seated position, wait 15 seconds after standing to begin walking.  If balance is a continuous problem, you might want to consider a walking aid such as a cane, walking stick, or walker. Once you've mastered walking with help, you might be ready to try it on your own again.

## 2022-07-06 NOTE — Addendum Note (Signed)
Addended by: Renae Gloss on: 07/06/2022 12:40 PM   Modules accepted: Orders

## 2022-07-07 ENCOUNTER — Telehealth (HOSPITAL_COMMUNITY): Payer: Self-pay

## 2022-07-07 NOTE — Telephone Encounter (Signed)
Called and spoke with patient to schedule Modified Barium Swallow - patient stated to call next week. Patient stated "something happened last night and I'm not the same today. I need to call the dr".   Will follow up with patient next week.

## 2022-07-13 ENCOUNTER — Ambulatory Visit: Payer: Commercial Managed Care - HMO | Admitting: Dermatology

## 2022-07-14 ENCOUNTER — Other Ambulatory Visit (HOSPITAL_COMMUNITY): Payer: Self-pay

## 2022-07-14 DIAGNOSIS — R1310 Dysphagia, unspecified: Secondary | ICD-10-CM

## 2022-07-15 ENCOUNTER — Other Ambulatory Visit (HOSPITAL_COMMUNITY): Payer: Self-pay

## 2022-07-15 NOTE — Telephone Encounter (Signed)
PA is NOT needed at this time. Patient has a $500 deductible to meet making his co-pay for 90 days $515.87.

## 2022-07-18 ENCOUNTER — Ambulatory Visit: Payer: Commercial Managed Care - HMO | Attending: Cardiovascular Disease | Admitting: *Deleted

## 2022-07-18 ENCOUNTER — Telehealth: Payer: Self-pay | Admitting: Pulmonary Disease

## 2022-07-18 ENCOUNTER — Other Ambulatory Visit: Payer: Self-pay | Admitting: Internal Medicine

## 2022-07-18 ENCOUNTER — Other Ambulatory Visit (HOSPITAL_COMMUNITY): Payer: Self-pay

## 2022-07-18 ENCOUNTER — Other Ambulatory Visit: Payer: Self-pay | Admitting: Cardiology

## 2022-07-18 DIAGNOSIS — I480 Paroxysmal atrial fibrillation: Secondary | ICD-10-CM | POA: Diagnosis not present

## 2022-07-18 DIAGNOSIS — Z5181 Encounter for therapeutic drug level monitoring: Secondary | ICD-10-CM | POA: Diagnosis not present

## 2022-07-18 DIAGNOSIS — Z7901 Long term (current) use of anticoagulants: Secondary | ICD-10-CM

## 2022-07-18 LAB — POCT INR: POC INR: 2.3

## 2022-07-18 NOTE — Telephone Encounter (Signed)
Pharmacy Patient Advocate Encounter   Received notification from Pt calls msgs/RMA that prior authorization for Dexcom G7 sensors is required/requested.  Per Test Claim: no indication of needing a PA, called pharmacy, same thing- just high copay amount. Called Ins to verify or find out if he had changes with his policy where they don't pay as much. It does need a PA.    PA submitted on 07/18/22 to (ins) [employer] via phone [phone] [facility] or [facility]) confirmation # case # [id number] Status is pending- should hear back via fax by the 7th, but likely before. It required clinical review. No chart notes requested at this time.

## 2022-07-18 NOTE — Patient Instructions (Signed)
Description   Continue taking warfarin 1/2 tablet daily except for 1 tablet every Mondays and Fridays. Recheck INR in 6 weeks. Anticoagulation Clinic [phone]

## 2022-07-18 NOTE — Telephone Encounter (Signed)
Dr. Elsworth Soho and Dr. Melvyn Novas please advise on patient wanting to switch. Is this okay?

## 2022-07-18 NOTE — Telephone Encounter (Signed)
Fine with me except I don't see sleep patients so if he sees alva for sleep he'll need to see [place] for sleep and/or me  for his breathing.

## 2022-07-18 NOTE — Telephone Encounter (Signed)
Spoke with patient. Rescheduled his apt to see Dr. Melvyn Novas. Patient advises he hasn't used cpap machine in years and his main focus is now with his breathing. Advised patient to bring all medications to apt. He verbalized understanding. Nothing further needed.

## 2022-07-19 ENCOUNTER — Ambulatory Visit
Admission: RE | Admit: 2022-07-19 | Discharge: 2022-07-19 | Disposition: A | Discharge status: Home / Self Care | Payer: Commercial Managed Care - HMO | Source: Ambulatory Visit | Attending: Nurse Practitioner | Admitting: Nurse Practitioner

## 2022-07-19 ENCOUNTER — Telehealth: Payer: Self-pay | Admitting: Pharmacy Technician

## 2022-07-19 ENCOUNTER — Other Ambulatory Visit (HOSPITAL_COMMUNITY): Payer: Self-pay

## 2022-07-19 DIAGNOSIS — Z122 Encounter for screening for malignant neoplasm of respiratory organs: Secondary | ICD-10-CM

## 2022-07-19 DIAGNOSIS — Z87891 Personal history of nicotine dependence: Secondary | ICD-10-CM

## 2022-07-19 NOTE — Telephone Encounter (Signed)
Pharmacy Patient Advocate Encounter  Prior Authorization for [employer] has been approved by [employer] (ins).   Already processed by the pharmacy yesterday.

## 2022-07-19 NOTE — Telephone Encounter (Signed)
Okay with me 

## 2022-07-20 ENCOUNTER — Telehealth: Payer: Self-pay | Admitting: Acute Care

## 2022-07-20 ENCOUNTER — Telehealth: Payer: Self-pay | Admitting: Anesthesiology

## 2022-07-20 NOTE — Telephone Encounter (Signed)
Pt called stating he received a call to get him schedule for PT, states he had asked Dr Berdine Addison to do home PT instead of going to a facility. States he is in a lot of pain and would rather get his therapy at home.

## 2022-07-20 NOTE — Telephone Encounter (Signed)
[facility] has received the report for review

## 2022-07-20 NOTE — Telephone Encounter (Signed)
Call Report

## 2022-07-20 NOTE — Therapy (Deleted)
OUTPATIENT SPEECH LANGUAGE PATHOLOGY SWALLOW EVALUATION   Patient Name: [name] MRN: [id number] DOB:[birth year], 67 y.o., male Today's Date: 07/20/2022  PCP: Ronnell Freshwater, NP REFERRING PROVIDER: Shellia Carwin, MD  END OF SESSION:   Past Medical History:  Diagnosis Date   Actinic keratosis    Arthritis    lower back   Asthma    Atrial fibrillation [facility])    Atrial flutter [facility])    s/p CTI ablation by Dr Rayann Heman   Brain aneurysm 2009   questionable. A follow up CTA in 2009 showed no evidence of   Chronic back pain    DDD/stenosis   Colon polyps    9 polyps removed [id number]   Complication of anesthesia 09/11/2012   slow to awaken after ablation   COPD (chronic obstructive pulmonary disease) ([place])    Diabetes mellitus    takes Metformin and Glimepiride daily   Emphysema    GERD (gastroesophageal reflux disease)    takes Omeprazole bid   Heart failure ([place])    History of shingles    HLD (hyperlipidemia)    takes Pravastatin daily   HTN (hypertension)    takes Prinizide daily   Obesity    OSA (obstructive sleep apnea)    not always using cpap   Overdose 2009   unintentional Flecanide overdose   Peripheral neuropathy    Short-term memory loss    Tobacco abuse    Past Surgical History:  Procedure Laterality Date   ANTERIOR CERVICAL DECOMP/DISCECTOMY FUSION  01/04/2012   Procedure: ANTERIOR CERVICAL DECOMPRESSION/DISCECTOMY FUSION 1 LEVEL/HARDWARE REMOVAL;  Surgeon: Ophelia Charter, MD;  Location: [place] NEURO ORS;  Service: Neurosurgery;  Laterality: N/A;  explore cervical fusion Cervical six - seven  with removal of codman plate anterior cervical decompression with fusion interbody prothesis plating and bonegraft   APPENDECTOMY  2-62yr ago   ALemooreN/A 09/11/2012   PT DID NOT HAVE AN ATRIAL FIBRILLATION ABLATION IN 2014!  ATRIAL FLUTTER ABLATION ONLY   ATRIAL FLUTTER ABLATION  09/11/2012   CTI ablation by Dr ARayann Heman  BIOPSY  11/07/2019    Procedure: BIOPSY;  Surgeon: JMilus Banister MD;  [facility];  Service: Endoscopy;;   CARDIAC CATHETERIZATION  2008   no significant CAD   CARDIOVERSION  05/05/2011   Procedure: CARDIOVERSION;  Surgeon: BLelon Perla MD;  [place]  Service: Cardiovascular;  Laterality: N/A;   CARDIOVERSION Bilateral 07/26/2012   Procedure: CARDIOVERSION;  Surgeon: JMinus Breeding MD;  [facility];  Service: Cardiovascular;  Laterality: Bilateral;   CARPAL TUNNEL RELEASE  99/2000   bilateral   COLONOSCOPY WITH PROPOFOL N/A 12/13/2012   Procedure: COLONOSCOPY WITH PROPOFOL;  Surgeon: DMilus Banister MD;  [facility];  Service: Endoscopy;  Laterality: N/A;   ELECTROPHYSIOLOGIC STUDY N/A 04/21/2015   Procedure: Atrial Fibrillation Ablation;  Surgeon: JThompson Grayer MD;  [place];  Service: Cardiovascular;  Laterality: N/A;   ESOPHAGOGASTRODUODENOSCOPY (EGD) WITH PROPOFOL N/A 11/07/2019   Procedure: ESOPHAGOGASTRODUODENOSCOPY (EGD) WITH PROPOFOL;  Surgeon: JMilus Banister MD;  [facility];  Service: Endoscopy;  Laterality: N/A;   HERNIA REPAIR     KNEE ARTHROSCOPY WITH MEDIAL MENISECTOMY Left 04/23/2021   Procedure: LEFT KNEE ARTHROSCOPY WITH PARTIAL MEDIAL MENISCECTOMY;  Surgeon: YMarybelle Killings MD;  [place]  Service: Orthopedics;  Laterality: Left;   KNEE SURGERY  6-735yrago   left   LEFT HEART CATH AND CORONARY ANGIOGRAPHY N/A 09/19/2016  Procedure: Left Heart Cath and Coronary Angiography;  Surgeon: Belva Crome, MD;  Location: [place] CV LAB;  Service: Cardiovascular;  Laterality: N/A;   Left inguinal hernia repair     as a child   NASAL SEPTOPLASTY W/ TURBINOPLASTY Bilateral 02/19/2014   Procedure: NASAL SEPTOPLASTY WITH BILATERAL TURBINATE REDUCTION;  Surgeon: Jodi Marble, MD;  Location: [place];  Service: ENT;  Laterality: Bilateral;   NECK SURGERY  88yr ago   right shoulder surgery  4-590yrago   cyst  removed   TEE WITHOUT CARDIOVERSION  05/05/2011   Procedure: TRANSESOPHAGEAL ECHOCARDIOGRAM (TEE);  Surgeon: BrLelon PerlaMD;  [facility];  Service: Cardiovascular;  Laterality: N/A;   TEE WITHOUT CARDIOVERSION N/A 04/21/2015   Procedure: TRANSESOPHAGEAL ECHOCARDIOGRAM (TEE);  Surgeon: TrSueanne MargaritaMD;  [facility];  Service: Cardiovascular;  Laterality: N/A;   THROAT SURGERY  4-5y50yrgo   "thought " it was cancer but came back not   TONSILLECTOMY     Patient Active Problem List   Diagnosis Date Noted   Tremor 03/20/2022   Cellulitis of right toe 03/06/2022   Neoplasm of uncertain behavior of skin of lower leg 01/31/2022   Cutaneous candidiasis 01/31/2022   Spinal stenosis, lumbar region, with neurogenic claudication 10/14/2021   S/P cervical spinal fusion 10/14/2021   Chronic pain of left knee 10/14/2021   Chronic painful diabetic neuropathy (HCCCalabasas6/05/2021   Abdominal discomfort 09/26/2021   Hepatomegaly 09/26/2021   Splenomegaly 09/26/2021   Atrophy of muscle of multiple sites 08/12/2021   Inflammatory polyarthropathy (HCCSurprise3/30/2023   Chronic pain syndrome 08/12/2021   Depression, major, single episode, moderate (HCCMiami3/30/2023   Arthritis of left knee 06/08/2021   Acute medial meniscal tear, left, initial encounter    Chronic meniscal tear of knee 03/15/2021   Dysphagia 04/17/2020   LVH (left ventricular hypertrophy) 11/07/2019   Heme positive stool 10/03/2019   Anemia 10/03/2019   Insulin dependent type 2 diabetes mellitus (HCCFort Atkinson4/04/2020   Chronic diastolic heart failure (HCCBronaugh4/04/2020   Rash and nonspecific skin eruption 08/26/2019   Foot pain 08/22/2019   Right heart failure (HCCSaline3/24/2021   Leg swelling 07/28/2019   Educated about COVID-19 virus infection 07/28/2019   Diabetes (HCCTaun[place]1/27/2021   Urticaria 05/15/2019   Dermatitis due to drug 04/18/2019   Edema 03/29/2019   Erectile dysfunction 03/29/2019   High risk  medication use 03/29/2019   History of iron deficiency 03/29/2019   Pulmonary hypertension (HCCHobe Sound1/13/2020   Venous stasis dermatitis of both lower extremities 03/29/2019   Mixed hyperlipidemia 02/14/2019   Onychomycosis due to dermatophyte 11/06/2018   Hammer toe of right foot 07/17/2018   Tinea pedis of both feet 07/17/2018   History of radiofrequency ablation (RFA) procedure for cardiac arrhythmia 06/21/2018   Lumbar radiculopathy 06/21/2018   Hepatic cirrhosis (HCCWaco8/13/2019   Left lower quadrant pain 12/05/2017   Change in bowel habits 12/05/2017   Palpitations 10/03/2017   SOB (shortness of breath) 04/12/2017   Chest pain 08/28/2016   Unstable angina (HCCGang Mills4/13/2018   Longstanding persistent atrial fibrillation (HCCButler2/10/2014   Deviated nasal septum 02/19/2014   History of colon polyps 11/22/2012   Syncope 10/17/2012   Acute asthmatic bronchitis 11/22/2011   Benign neoplasm of colon 10/13/2011   Unspecified gastritis and gastroduodenitis without mention of hemorrhage 10/13/2011   GERD (gastroesophageal reflux disease) 10/13/2011   Body mass index (BMI) of 31.0-31.9 in adult 09/30/2011   Dyslipidemia 06/23/2011   Neuropathy  06/23/2011   Weakness 05/13/2011   Malaise and fatigue 05/13/2011   Rectal bleeding 04/18/2011   Atrial flutter ([place]) 04/18/2011   Chronic anticoagulation 11/10/2010   PAF (paroxysmal atrial fibrillation) (HCC)    Tobacco abuse    Moderate COPD (chronic obstructive pulmonary disease) ([place]) 02/11/2010   Essential hypertension 01/28/2010   OSA (obstructive sleep apnea) 01/28/2010    ONSET DATE: 07/06/2022 referral date   REFERRING DIAG: R13.14 (ICD-10-CM) - Pharyngoesophageal dysphagia   THERAPY DIAG:  No diagnosis found.  Rationale for Evaluation and Treatment: Rehabilitation  SUBJECTIVE:   SUBJECTIVE STATEMENT: *** Pt accompanied by: {accompnied:27141}  PERTINENT HISTORY: history of cervical stenosis s/p fusion (does not know the  levels), lumbar stenosis, HTN, CAD, COPD, OSA (CPAP intolerant), DM2, afib s/p ablation in 2016, cirrhosis 2/2 EtOH, OA, smoker   PAIN:  Are you having pain? {OPRCPAIN:27236}  FALLS: Has patient fallen in last 6 months?  Yes, see PT eval for details  LIVING ENVIRONMENT: Lives with: {OPRC lives with:25569::"lives with their family"} Lives in: {Lives in:25570}  PLOF:  Level of assistance: [id number] Employment: {SLPemployment:25674}  PATIENT GOALS: ***  OBJECTIVE:   DIAGNOSTIC FINDINGS: From Dr. Berdine Addison 07/06/2022 PN "This is [name], a 67 y.o. male with:   Numbness, tingling, pain, and weakness - These symptoms are likely multifactorial with contributions from arthritis, cervical and lumbar spine disease, and polyneuropathy. His known risk factors for PN are DM, EtOH abuse, and B1 deficiency. Essential tremor - Patient did not take primidone long because he felt sleepy on it. He is already on gabapentin and propranolol would not be a good option given his other cardiac medications. He is willing to retry primidone and give it more time. Dysphagia - reported by patient. Unclear etiology, but per patient was told his "uvula" was causing problems getting food stuck. Per GI note from 03/01/22, previous EGD in 10/2019 showed no stricture or ring. He did not want barium swallow at that time. He is agreeable to swallow evaluation today."  RECOMMENDATIONS FROM OBJECTIVE SWALLOW STUDY (MBSS/FEES):  scheduled for 312/2024 Objective swallow impairments: TBD Objective recommended compensations: TBD  COGNITION: Overall cognitive status: {cognition:24006} Areas of impairment:  {cognitiveimpairmentslp:27409} Functional deficits: ***  ORAL MOTOR EXAMINATION: Overall status: {OMESLP2:27645} Comments: ***  CLINICAL SWALLOW ASSESSMENT:   Current diet: {slpdiet:27196} Dentition: {dentition:27197} Patient directly observed with POs: {POobserved:27199} Feeding: {slp  feeding:27200} Liquids provided by: {SLPliquids:27201} Oral phase signs and symptoms: {SLPoralphase:27202} Pharyngeal phase signs and symptoms: {SLPpharyngealphase:27203}  PATIENT REPORTED OUTCOME MEASURES (PROM): {SLPPROM:27095}   TODAY'S TREATMENT:  07-21-2022: ***  PATIENT EDUCATION: Education details: *** Person educated: {Person educated:25204} Education method: {Education Method:25205} Education comprehension: {Education Comprehension:25206}   ASSESSMENT:  CLINICAL IMPRESSION: Patient is a 67 y.o. M who was seen today for clinical swallow evaluation upon referral from Dr. Berdine Addison. Clinical evaluation reveals {MILD/MOD/MARKED/SEVERE:28894} {SLPOBJIMP:27107}. Pt's swallow is c/b ***. Pt reports ***. Pt is scheduled for MBSS to objectively assess swallow function and safety on 07-26-2022. Pt would benefit from skilled ST to address aforementioned deficits to {CI outcomes:29167}.    OBJECTIVE IMPAIRMENTS: include {SLPOBJIMP:27107}. These impairments are limiting patient from {SLPLIMIT:27108}. Factors affecting potential to achieve goals and functional outcome are {SLP potential:25450}. Patient will benefit from skilled SLP services to address above impairments and improve overall function.  REHAB POTENTIAL: {rehabpotential:25112}   GOALS: Goals reviewed with patient? {yes/no:20286}  SHORT TERM GOALS: Target date: ***  *** Baseline: Goal status: {GOALSTATUS:25110}  2.  *** Baseline:  Goal status: {GOALSTATUS:25110}  3.  *** Baseline:  Goal status: {GOALSTATUS:25110}  4.  *** Baseline:  Goal status: {GOALSTATUS:25110}  5.  *** Baseline:  Goal status: {GOALSTATUS:25110}  6.  *** Baseline:  Goal status: {GOALSTATUS:25110}  LONG TERM GOALS: Target date: ***  *** Baseline:  Goal status: {GOALSTATUS:25110}  2.  *** Baseline:  Goal status: {GOALSTATUS:25110}  3.  *** Baseline:  Goal status: {GOALSTATUS:25110}  4.  *** Baseline:  Goal status:  {GOALSTATUS:25110}  5.  *** Baseline:  Goal status: {GOALSTATUS:25110}  6.  *** Baseline:  Goal status: {GOALSTATUS:25110}  PLAN:  SLP FREQUENCY: {rehab frequency:25116}  SLP DURATION: {rehab duration:25117}  PLANNED INTERVENTIONS: {SLP treatment/interventions:25449}    Su Monks, CCC-SLP 07/20/2022, 12:32 PM

## 2022-07-20 NOTE — Telephone Encounter (Signed)
Received call report from [place] with [facility] on patient's [place] done on 07/19/22. Sarah please review the result/impression copied below:  **No current report avail for [place] to see in Epic but Diane did say that it was read as a lung rads 2S.  She said if a report needed to be faxed over, they can certainly fax this over. Sarah, please advise on this if you want a report to be faxed over or just keep an eye out in pt's chart for the report to come avail?

## 2022-07-21 ENCOUNTER — Other Ambulatory Visit: Payer: Self-pay

## 2022-07-21 ENCOUNTER — Ambulatory Visit: Payer: Medicaid Other | Admitting: Speech Pathology

## 2022-07-21 ENCOUNTER — Ambulatory Visit: Payer: Medicaid Other | Admitting: Physical Therapy

## 2022-07-21 DIAGNOSIS — M542 Cervicalgia: Secondary | ICD-10-CM

## 2022-07-21 DIAGNOSIS — R531 Weakness: Secondary | ICD-10-CM

## 2022-07-21 DIAGNOSIS — G250 Essential tremor: Secondary | ICD-10-CM

## 2022-07-21 DIAGNOSIS — M48062 Spinal stenosis, lumbar region with neurogenic claudication: Secondary | ICD-10-CM

## 2022-07-21 DIAGNOSIS — G629 Polyneuropathy, unspecified: Secondary | ICD-10-CM

## 2022-07-21 DIAGNOSIS — M4802 Spinal stenosis, cervical region: Secondary | ICD-10-CM

## 2022-07-21 DIAGNOSIS — R52 Pain, unspecified: Secondary | ICD-10-CM

## 2022-07-21 NOTE — Telephone Encounter (Signed)
Sent referral to [facility] and called patient and left message

## 2022-07-25 ENCOUNTER — Ambulatory Visit (INDEPENDENT_AMBULATORY_CARE_PROVIDER_SITE_OTHER): Payer: Commercial Managed Care - HMO | Admitting: Dermatology

## 2022-07-25 ENCOUNTER — Telehealth: Payer: Self-pay

## 2022-07-25 DIAGNOSIS — L820 Inflamed seborrheic keratosis: Secondary | ICD-10-CM | POA: Diagnosis not present

## 2022-07-25 DIAGNOSIS — D485 Neoplasm of uncertain behavior of skin: Secondary | ICD-10-CM

## 2022-07-25 NOTE — Progress Notes (Unsigned)
   Follow-Up Visit   Subjective  [name] is a 67 y.o. male who presents for the following: Follow-up (Recheck ISK of left scalp treated with LN2). The patient has spots, moles and lesions to be evaluated, some may be new or changing and the patient has concerns that these could be cancer.  The following portions of the chart were reviewed this encounter and updated as appropriate:   Tobacco  Allergies  Meds  Problems  Med Hx  Surg Hx  Fam Hx     Review of Systems:  No other skin or systemic complaints except as noted in HPI or Assessment and Plan.  Objective  Well appearing patient in no apparent distress; mood and affect are within normal limits.  A focused examination was performed including scalp. Relevant physical exam findings are noted in the Assessment and Plan.  Left scalp 1.5 cm verrucous papule   Assessment & Plan  Neoplasm of uncertain behavior of skin Left scalp Epidermal / dermal shaving  Lesion diameter (cm):  1.5 Informed consent: discussed and consent obtained   Timeout: patient name, date of birth, surgical site, and procedure verified   Procedure prep:  Patient was prepped and draped in usual sterile fashion Prep type:  Isopropyl alcohol Anesthesia: the lesion was anesthetized in a standard fashion   Anesthetic:  1% lidocaine w/ epinephrine 1-100,000 buffered w/ 8.4% NaHCO3 Instrument used: flexible razor blade   Hemostasis achieved with: pressure, aluminum chloride and electrodesiccation   Outcome: patient tolerated procedure well   Post-procedure details: sterile dressing applied and wound care instructions given   Dressing type: bandage and petrolatum    Specimen 1 - Surgical pathology Differential Diagnosis: Wart vs Inflamed SK vs SCC vs other Check Margins: No  Return for Follow up as scheduled, [facility].  I, Ashok Cordia, CMA, am acting as scribe for Sarina Ser, MD . Documentation: I have reviewed the above documentation for accuracy  and completeness, and I agree with the above.  Sarina Ser, MD

## 2022-07-25 NOTE — Telephone Encounter (Signed)
I am trying to find home Health Care for this patient. I have sent to  [facility]  Sending to [facility]

## 2022-07-25 NOTE — Patient Instructions (Signed)
Shave Excision Benign Lesion Wound Care Instructions  Leave the original bandage on for 24 hours if possible.  If the bandage becomes soaked or soiled before that time, it is OK to remove it and examine the wound.  A small amount of post-operative bleeding is normal.  If excessive bleeding occurs, remove the bandage, place gauze over the site and apply continuous pressure (no peeking) over the area for 20-30 minutes.  If this does not stop the bleeding, try again for 40 minutes.  If this does not work, please call our clinic as soon as possible (even if after-hours).    Twice a day, cleanse the wound with soap and water.  If a thick crust develops you may use a Q-tip dipped into dilute hydrogen peroxide (mix 1:1 with water) to dissolve it.  Hydrogen peroxide can slow the healing process, so use it only as needed.  After washing, apply Vaseline jelly or Polysporin ointment.  For best healing, the wound should be covered with a layer of ointment at all times.  This may mean re-applying the ointment several times a day.  For open wounds, continue until it has healed.    If you have any swelling, keep the area elevated.  Some redness, tenderness and white or yellow material in the wound is normal healing.  If the area becomes very sore and red, or develops a thick yellow-green material (pus), it may be infected; please notify us.    Wound healing continues for up to one year following surgery.  It is not unusual to experience pain in the scar from time to time during the interval.  If the pain becomes severe or the scar thickens, you should notify the office.  A slight amount of redness in a scar is expected for the first six months.  After six months, the redness subsides and the scar will soften and fade.  The color difference becomes less noticeable with time.  If there are any problems, return for a post-op surgery check at your earliest convenience.  Please call our office for any questions or  concerns.  Due to recent changes in healthcare laws, you may see results of your pathology and/or laboratory studies on MyChart before the doctors have had a chance to review them. We understand that in some cases there may be results that are confusing or concerning to you. Please understand that not all results are received at the same time and often the doctors may need to interpret multiple results in order to provide you with the best plan of care or course of treatment. Therefore, we ask that you please give us 2 business days to thoroughly review all your results before contacting the office for clarification. Should we see a critical lab result, you will be contacted sooner.   If You Need Anything After Your Visit  If you have any questions or concerns for your doctor, please call our main line at [phone] and press option 4 to reach your doctor's medical assistant. If no one answers, please leave a voicemail as directed and we will return your call as soon as possible. Messages left after 4 pm will be answered the following business day.   You may also send us a message via MyChart. We typically respond to MyChart messages within 1-2 business days.  For prescription refills, please ask your pharmacy to contact our office. Our fax number is [phone].  If you have an urgent issue when the clinic is closed that   cannot wait until the next business day, you can page your doctor at the number below.    Please note that while we do our best to be available for urgent issues outside of office hours, we are not available 24/7.   If you have an urgent issue and are unable to reach us, you may choose to seek medical care at your doctor's office, retail clinic, urgent care center, or emergency room.  If you have a medical emergency, please immediately call 911 or go to the emergency department.  Pager Numbers  - Dr. Kowalski: [phone]  - Dr. Moye: [phone]  - Dr. Stewart:  [phone]  In the event of inclement weather, please call our main line at [phone] for an update on the status of any delays or closures.  Dermatology Medication Tips: Please keep the boxes that topical medications come in in order to help keep track of the instructions about where and how to use these. Pharmacies typically print the medication instructions only on the boxes and not directly on the medication tubes.   If your medication is too expensive, please contact our office at [phone] option 4 or send us a message through MyChart.   We are unable to tell what your co-pay for medications will be in advance as this is different depending on your insurance coverage. However, we may be able to find a substitute medication at lower cost or fill out paperwork to get insurance to cover a needed medication.   If a prior authorization is required to get your medication covered by your insurance company, please allow us 1-2 business days to complete this process.  Drug prices often vary depending on where the prescription is filled and some pharmacies may offer cheaper prices.  The website www.goodrx.com contains coupons for medications through different pharmacies. The prices here do not account for what the cost may be with help from insurance (it may be cheaper with your insurance), but the website can give you the price if you did not use any insurance.  - You can print the associated coupon and take it with your prescription to the pharmacy.  - You may also stop by our office during regular business hours and pick up a GoodRx coupon card.  - If you need your prescription sent electronically to a different pharmacy, notify our office through [facility] MyChart or by phone at [phone] option 4.     Si Usted Necesita Algo Despus de Su Visita  Tambin puede enviarnos un mensaje a travs de MyChart. Por lo general respondemos a los mensajes de MyChart en el transcurso de 1 a 2  das hbiles.  Para renovar recetas, por favor pida a su farmacia que se ponga en contacto con nuestra oficina. Nuestro nmero de fax es el [phone].  Si tiene un asunto urgente cuando la clnica est cerrada y que no puede esperar hasta el siguiente da hbil, puede llamar/localizar a su doctor(a) al nmero que aparece a continuacin.   Por favor, tenga en cuenta que aunque hacemos todo lo posible para estar disponibles para asuntos urgentes fuera del horario de oficina, no estamos disponibles las 24 horas del da, los 7 das de la semana.   Si tiene un problema urgente y no puede comunicarse con nosotros, puede optar por buscar atencin mdica  en el consultorio de su doctor(a), en una clnica privada, en un centro de atencin urgente o en una sala de emergencias.  Si tiene una emergencia mdica, por favor   llame inmediatamente al 911 o vaya a la sala de emergencias.  Nmeros de bper  - Dr. Kowalski: [phone]  - Dra. Moye: [phone]  - Dra. Stewart: [phone]  En caso de inclemencias del tiempo, por favor llame a nuestra lnea principal al [phone] para una actualizacin sobre el estado de cualquier retraso o cierre.  Consejos para la medicacin en dermatologa: Por favor, guarde las cajas en las que vienen los medicamentos de uso tpico para ayudarle a seguir las instrucciones sobre dnde y cmo usarlos. Las farmacias generalmente imprimen las instrucciones del medicamento slo en las cajas y no directamente en los tubos del medicamento.   Si su medicamento es muy caro, por favor, pngase en contacto con nuestra oficina llamando al [phone] y presione la opcin 4 o envenos un mensaje a travs de MyChart.   No podemos decirle cul ser su copago por los medicamentos por adelantado ya que esto es diferente dependiendo de la cobertura de su seguro. Sin embargo, es posible que podamos encontrar un medicamento sustituto a menor costo o llenar un formulario para que el  seguro cubra el medicamento que se considera necesario.   Si se requiere una autorizacin previa para que su compaa de seguros cubra su medicamento, por favor permtanos de 1 a 2 das hbiles para completar este proceso.  Los precios de los medicamentos varan con frecuencia dependiendo del lugar de dnde se surte la receta y alguna farmacias pueden ofrecer precios ms baratos.  El sitio web www.goodrx.com tiene cupones para medicamentos de diferentes farmacias. Los precios aqu no tienen en cuenta lo que podra costar con la ayuda del seguro (puede ser ms barato con su seguro), pero el sitio web puede darle el precio si no utiliz ningn seguro.  - Puede imprimir el cupn correspondiente y llevarlo con su receta a la farmacia.  - Tambin puede pasar por nuestra oficina durante el horario de atencin regular y recoger una tarjeta de cupones de GoodRx.  - Si necesita que su receta se enve electrnicamente a una farmacia diferente, informe a nuestra oficina a travs de MyChart de [facility] o por telfono llamando al [phone] y presione la opcin 4.  

## 2022-07-26 ENCOUNTER — Ambulatory Visit (HOSPITAL_COMMUNITY)
Admission: RE | Admit: 2022-07-26 | Discharge: 2022-07-26 | Disposition: A | Discharge status: Home / Self Care | Payer: Commercial Managed Care - HMO | Source: Ambulatory Visit | Attending: Nurse Practitioner | Admitting: Nurse Practitioner

## 2022-07-26 ENCOUNTER — Encounter: Payer: Self-pay | Admitting: Dermatology

## 2022-07-26 ENCOUNTER — Ambulatory Visit (HOSPITAL_COMMUNITY): Payer: Commercial Managed Care - HMO

## 2022-07-26 DIAGNOSIS — R1314 Dysphagia, pharyngoesophageal phase: Secondary | ICD-10-CM

## 2022-08-01 ENCOUNTER — Ambulatory Visit (INDEPENDENT_AMBULATORY_CARE_PROVIDER_SITE_OTHER): Payer: Commercial Managed Care - HMO | Admitting: Nurse Practitioner

## 2022-08-01 ENCOUNTER — Telehealth: Payer: Self-pay

## 2022-08-01 ENCOUNTER — Encounter: Payer: Self-pay | Admitting: Nurse Practitioner

## 2022-08-01 VITALS — BP 134/64 | HR 68 | Ht 74.0 in | Wt 253.1 lb

## 2022-08-01 DIAGNOSIS — M10442 Other secondary gout, left hand: Secondary | ICD-10-CM

## 2022-08-01 DIAGNOSIS — L03031 Cellulitis of right toe: Secondary | ICD-10-CM

## 2022-08-01 DIAGNOSIS — N1831 Chronic kidney disease, stage 3a: Secondary | ICD-10-CM | POA: Diagnosis not present

## 2022-08-01 DIAGNOSIS — R0602 Shortness of breath: Secondary | ICD-10-CM

## 2022-08-01 DIAGNOSIS — I10 Essential (primary) hypertension: Secondary | ICD-10-CM

## 2022-08-01 DIAGNOSIS — M109 Gout, unspecified: Secondary | ICD-10-CM | POA: Insufficient documentation

## 2022-08-01 DIAGNOSIS — R229 Localized swelling, mass and lump, unspecified: Secondary | ICD-10-CM | POA: Insufficient documentation

## 2022-08-01 DIAGNOSIS — Z0001 Encounter for general adult medical examination with abnormal findings: Secondary | ICD-10-CM | POA: Diagnosis not present

## 2022-08-01 DIAGNOSIS — Z794 Long term (current) use of insulin: Secondary | ICD-10-CM | POA: Diagnosis not present

## 2022-08-01 DIAGNOSIS — E1122 Type 2 diabetes mellitus with diabetic chronic kidney disease: Secondary | ICD-10-CM | POA: Diagnosis not present

## 2022-08-01 DIAGNOSIS — R1314 Dysphagia, pharyngoesophageal phase: Secondary | ICD-10-CM

## 2022-08-01 DIAGNOSIS — R531 Weakness: Secondary | ICD-10-CM | POA: Diagnosis not present

## 2022-08-01 DIAGNOSIS — I4811 Longstanding persistent atrial fibrillation: Secondary | ICD-10-CM

## 2022-08-01 DIAGNOSIS — R5383 Other fatigue: Secondary | ICD-10-CM

## 2022-08-01 DIAGNOSIS — R251 Tremor, unspecified: Secondary | ICD-10-CM

## 2022-08-01 LAB — POCT UA - MICROALBUMIN
Albumin/Creatinine Ratio, Urine, POC: >300
Creatinine, POC: 50 mg/dL
Microalbumin Ur, POC: 150 mg/L

## 2022-08-01 MED ORDER — SULFAMETHOXAZOLE-TRIMETHOPRIM 800-160 MG PO TABS: 1.0000 | ORAL_TABLET | Freq: Two times a day (BID) | ORAL | 0 refills | Status: DC

## 2022-08-01 NOTE — Telephone Encounter (Signed)
-----   Message from Ralene Bathe, MD sent at 07/29/2022 12:37 PM EDT ----- Diagnosis Skin , left scalp SEBORRHEIC KERATOSIS, IRRITATED  Benign irritated keratosis No further treatment needed unless recurs

## 2022-08-01 NOTE — Patient Instructions (Addendum)
For possible gout flare -  --take colchicine 0.6 mg twice daily for next 3 days  -then take daily to prevent gout flare.  -check uric acid level today. If elevated, will change colchicine to uloric.   For your foot -  -take bactrim twice daily for next 14 days.  -soak foot in warm water and epsom salt for 20 minutes one to two times daily

## 2022-08-01 NOTE — Progress Notes (Signed)
Complete physical exam   Patient: [name]   DOB: [birth year]   67 y.o. Male  MRN: [id number] Visit Date: 08/01/2022    Chief Complaint  Patient presents with   Annual Exam   Subjective    [name] is a 67 y.o. male who presents today for a complete physical exam.  He reports consuming a low sodium and diabetic, 1500 calorie diet. Exercise is limited by neurologic condition(s): severe neuropathy, orthopedic condition(s): osteoarthritis and gout. He generally feels poorly. He does have additional problems to discuss today.   HPI  Annual physical  -DM2 -due for urine microalbumin -sees endocrinology   --recent HgbA1c done 06/2022 was 7.6 --due for eye exam.  -check status of chest nodule  reported at last visit.  -due for routine, fasting labs - check uric acid level, Having more severe pain in left index finger.  --swelling present in medial joint of the index finger.  --joint is red is swollen . Fall in the home last Thursday.  -right toe has a "drop" and toe got caught on something. Lunged forward and hit head very hard on cabinet.  -does not believe he lost consciousness.  -had a neighbor come help him get up.  -had some "sluggishness" after the fall.  -doing better now.   -continues to have wound to right great toe.  --oozing small amount of clear fluid.  -no redness or tenderness noted.     Past Medical History:  Diagnosis Date   Actinic keratosis    Arthritis    lower back   Asthma    Atrial fibrillation [place])    Atrial flutter [facility])    s/p CTI ablation by Dr Rayann Heman   Brain aneurysm 2009   questionable. A follow up CTA in 2009 showed no evidence of   Chronic back pain    DDD/stenosis   Colon polyps    9 polyps removed [id number]   Complication of anesthesia 09/11/2012   slow to awaken after ablation   COPD (chronic obstructive pulmonary disease) ([place])    Diabetes mellitus    takes Metformin and Glimepiride daily   Emphysema    GERD  (gastroesophageal reflux disease)    takes Omeprazole bid   Heart failure ([place])    History of shingles    HLD (hyperlipidemia)    takes Pravastatin daily   HTN (hypertension)    takes Prinizide daily   Obesity    OSA (obstructive sleep apnea)    not always using cpap   Overdose 2009   unintentional Flecanide overdose   Peripheral neuropathy    Short-term memory loss    Tobacco abuse    Past Surgical History:  Procedure Laterality Date   ANTERIOR CERVICAL DECOMP/DISCECTOMY FUSION  01/04/2012   Procedure: ANTERIOR CERVICAL DECOMPRESSION/DISCECTOMY FUSION 1 LEVEL/HARDWARE REMOVAL;  Surgeon: Ophelia Charter, MD;  Location: [place] [facility]URO ORS;  Service: Neurosurgery;  Laterality: N/A;  explore cervical fusion Cervical six - seven  with removal of codman plate anterior cervical decompression with fusion interbody prothesis plating and bonegraft   APPENDECTOMY  2-11yrs ago   [place] N/A 09/11/2012   PT DID NOT HAVE AN ATRIAL FIBRILLATION ABLATION IN 2014!  ATRIAL FLUTTER ABLATION ONLY   ATRIAL FLUTTER ABLATION  09/11/2012   CTI ablation by Dr Rayann Heman   BIOPSY  11/07/2019   Procedure: BIOPSY;  Surgeon: Milus Banister, MD;  Location: [facility];  Service: Endoscopy;;   CARDIAC CATHETERIZATION  2008   no significant  CAD   CARDIOVERSION  05/05/2011   Procedure: CARDIOVERSION;  Surgeon: Lelon Perla, MD;  Location: [place];  Service: Cardiovascular;  Laterality: N/A;   CARDIOVERSION Bilateral 07/26/2012   Procedure: CARDIOVERSION;  Surgeon: Minus Breeding, MD;  Location: [facility] ENDOSCOPY;  Service: Cardiovascular;  Laterality: Bilateral;   CARPAL TUNNEL RELEASE  99/2000   bilateral   COLONOSCOPY WITH PROPOFOL N/A 12/13/2012   Procedure: COLONOSCOPY WITH PROPOFOL;  Surgeon: Milus Banister, MD;  Location: [facility];  Service: Endoscopy;  Laterality: N/A;   ELECTROPHYSIOLOGIC STUDY N/A 04/21/2015   Procedure: Atrial Fibrillation Ablation;  Surgeon: Thompson Grayer, MD;   Location: [place] CV LAB;  Service: Cardiovascular;  Laterality: N/A;   ESOPHAGOGASTRODUODENOSCOPY (EGD) WITH PROPOFOL N/A 11/07/2019   Procedure: ESOPHAGOGASTRODUODENOSCOPY (EGD) WITH PROPOFOL;  Surgeon: Milus Banister, MD;  Location: [facility];  Service: Endoscopy;  Laterality: N/A;   HERNIA REPAIR     KNEE ARTHROSCOPY WITH MEDIAL MENISECTOMY Left 04/23/2021   Procedure: LEFT KNEE ARTHROSCOPY WITH PARTIAL MEDIAL MENISCECTOMY;  Surgeon: [name], MD;  Location: [place];  Service: Orthopedics;  Laterality: Left;   KNEE SURGERY  6-31yrs ago   left   LEFT HEART CATH AND CORONARY ANGIOGRAPHY N/A 09/19/2016   Procedure: Left Heart Cath and Coronary Angiography;  Surgeon: Belva Crome, MD;  Location: [place] CV LAB;  Service: Cardiovascular;  Laterality: N/A;   Left inguinal hernia repair     as a child   NASAL SEPTOPLASTY W/ TURBINOPLASTY Bilateral 02/19/2014   Procedure: NASAL SEPTOPLASTY WITH BILATERAL TURBINATE REDUCTION;  Surgeon: Jodi Marble, MD;  Location: [place];  Service: ENT;  Laterality: Bilateral;   NECK SURGERY  50yrs ago   right shoulder surgery  4-99yrs ago   cyst removed   TEE WITHOUT CARDIOVERSION  05/05/2011   Procedure: TRANSESOPHAGEAL ECHOCARDIOGRAM (TEE);  Surgeon: Lelon Perla, MD;  Location: [facility] ENDOSCOPY;  Service: Cardiovascular;  Laterality: N/A;   TEE WITHOUT CARDIOVERSION N/A 04/21/2015   Procedure: TRANSESOPHAGEAL ECHOCARDIOGRAM (TEE);  Surgeon: Sueanne Margarita, MD;  Location: [facility] ENDOSCOPY;  Service: Cardiovascular;  Laterality: N/A;   THROAT SURGERY  4-37yrs ago   "thought " it was cancer but came back not   TONSILLECTOMY     Social History   Socioeconomic History   Marital status: Married    Spouse name: Not on file   Number of children: 2   Years of education: Not on file   Highest education level: Not on file  Occupational History   Occupation: Dealer   Tobacco Use   Smoking status: Every Day    Packs/day: 1.00    Years: 49.00     Additional pack years: 0.00    Total pack years: 49.00    Types: Cigarettes   Smokeless tobacco: Never   Tobacco comments:    Smoking almost a pack per day 03/08/2022 hfb  Vaping Use   Vaping Use: Never used  Substance and Sexual Activity   Alcohol use: Not Currently    Alcohol/week: 0.0 standard drinks of alcohol    Comment: 12/05/17 pt stated he has drinked 1/2 drink in 3 years   Drug use: No   Sexual activity: Yes  Other Topics Concern   Not on file  Social History Narrative   Daily caffeine(Mountain Dew) Lives in [place] with spouse.Unemployed due to chronic back/ leg pain         Are you right handed or left handed? Left Handed    Are you currently  employed ? No    What is your current occupation? No   Do you live at home alone? no   Who lives with you? Lives with wife and 3 doggies   What type of home do you live in: 1 story or 2 story? Lives in one story home       Social Determinants of Health   Financial Resource Strain: Not on file  Food Insecurity: Not on file  Transportation Needs: Not on file  Physical Activity: Not on file  Stress: Not on file  Social Connections: Not on file  Intimate Partner Violence: Not on file   Family Status  Relation Name Status   Mother  Deceased   Father  Deceased   MGM  Deceased   MGF  Deceased   PGM  Deceased   PGF  Deceased   Neg Hx  (Not Specified)   Family History  Problem Relation Age of Onset   Diabetes Mother    Heart disease Father    Lung cancer Father    Diabetes Maternal Grandmother    Colon cancer Neg Hx    Anesthesia problems Neg Hx    Hypotension Neg Hx    Malignant hyperthermia Neg Hx    Pseudochol deficiency Neg Hx    Esophageal cancer Neg Hx    Pancreatic cancer Neg Hx    Stomach cancer Neg Hx    Allergies  Allergen Reactions   Adhesive [Tape]     itching   Latex Itching    When tape is on the skin too long skin gets red & itching    Patient Care Team: Ronnell Freshwater, NP as PCP -  General (Family Medicine) Minus Breeding, MD as PCP - Cardiology (Cardiology) Clark-Burning, Anderson Malta, PA-C (Inactive) (Dermatology) Renato Shin, MD (Inactive) as Consulting Physician (Endocrinology) Lavonna Monarch, MD (Inactive) as Consulting Physician (Dermatology) Shellia Carwin, MD as Consulting Physician (Neurology)   Medications: Outpatient Medications Prior to Visit  Medication Sig   albuterol (PROAIR HFA) 108 (90 Base) MCG/ACT inhaler Inhale 1-2 puffs into the lungs every 6 (six) hours as needed for wheezing or shortness of breath.   albuterol (PROVENTIL) (2.5 MG/3ML) 0.083% nebulizer solution Take 3 mLs (2.5 mg total) by nebulization every 6 (six) hours as needed for wheezing or shortness of breath.   b complex vitamins tablet Take 1 tablet by mouth daily.    bisoprolol (ZEBETA) 10 MG tablet Take 10 mg by mouth daily.    colchicine 0.6 MG tablet TAKE 2 TABLETS BY MOUTH AT ONCE. THEN TAKE 1 TABLET 1 HOUR LATER. WAIT 2 DAYS, THEN BEGIN TAKING 1 TABLET DAILY TO PREVENT ACUTE GOUT.   Continuous Blood Gluc Sensor (DEXCOM G7 SENSOR) MISC 3 each by Does not apply route every 30 (thirty) days. Apply 1 sensor every 10 days   Continuous Blood Gluc Transmit (DEXCOM G6 TRANSMITTER) MISC USE AS DIRECTED   diltiazem (CARDIZEM CD) 120 MG 24 hr capsule Take 1 capsule (120 mg total) by mouth daily.   Dulaglutide (TRULICITY) 3 0000000 SOPN Inject 3 mg as directed once a week.   fluticasone-salmeterol (ADVAIR) 100-50 MCG/ACT AEPB Inhale 1 puff into the lungs 2 (two) times daily.   gabapentin (NEURONTIN) 300 MG capsule TAKE 3 CAPSULES BY MOUTH TWICE DAILY FOR NEUROPATHY   Glucagon 3 MG/DOSE POWD Place 3 mg into the nose once as needed for up to 1 dose.   Insulin Pen Needle (EASY COMFORT PEN NEEDLES) 31G X 5 MM MISC To use  with as needed insulin dosing up to four times daily   insulin regular human CONCENTRATED (HUMULIN R U-500 KWIKPEN) 500 UNIT/ML KwikPen Inject 260 Units into the skin daily. Use up  to 250-260 units of insulin daily as advised   ketoconazole (NIZORAL) 2 % cream Apply 1 Application topically daily.   ketoconazole (NIZORAL) 2 % shampoo Apply 1 Application topically 2 (two) times a week. 2x/wk, let sit 5 minutes and rinse off, wash from scalp to waist, avoid eyes   losartan (COZAAR) 50 MG tablet TAKE 1 TABLET BY MOUTH DAILY   metolazone (ZAROXOLYN) 2.5 MG tablet TAKE 1 TABLET BY MOUTH ON TUESDAY AND THURSDAY 30 MINUTES BEFORE TAKING TORSEMIDE OR AS DIRECTED   mupirocin ointment (BACTROBAN) 2 % Ply small amount to effected area twice daily for 10 days.   nitroGLYCERIN (NITROSTAT) 0.4 MG SL tablet Place 1 tablet (0.4 mg total) under the tongue every 5 (five) minutes x 3 doses as needed for chest pain.   omeprazole (PRILOSEC) 20 MG capsule TAKE 1 CAPSULE BY MOUTH TWICE DAILY   oxyCODONE-acetaminophen (PERCOCET) 10-325 MG tablet Take 1.5 tablets by mouth in the morning, at noon, in the evening, and at bedtime.   potassium chloride SA (KLOR-CON M) 20 MEQ tablet TAKE 1 TABLET BY MOUTH DAILY. TAKE AN EXTRA TABLET ON TUESDAY AND THURSDAY   tamsulosin (FLOMAX) 0.4 MG CAPS capsule TAKE 1 CAPSULE BY MOUTH EACH NIGHT AT BEDTIME   torsemide (DEMADEX) 20 MG tablet Take 2 tablets in the am and 1 tablet in the pm   triamcinolone cream (KENALOG) 0.1 % Apply 1 Application topically daily as needed.   umeclidinium bromide (INCRUSE ELLIPTA) 62.5 MCG/ACT AEPB Inhale 1 puff into the lungs daily.   VITAMIN A PO Take 2,400 mcg by mouth daily.    Vitamin D, Ergocalciferol, (DRISDOL) 1.25 MG (50000 UNIT) CAPS capsule TAKE 1 CAPSULE BY MOUTH EVERY 7 DAYS   warfarin (COUMADIN) 5 MG tablet Take 1/2 to 1 tablet daily or as directed   cyclobenzaprine (FLEXERIL) 10 MG tablet Take 10 mg by mouth 3 (three) times daily. (Patient not taking: Reported on 07/06/2022)   pravastatin (PRAVACHOL) 80 MG tablet TAKE 1 TABLET BY MOUTH DAILY AT BEDTIME (Patient not taking: Reported on 07/06/2022)   [DISCONTINUED]  sulfamethoxazole-trimethoprim (BACTRIM DS) 800-160 MG tablet Take 1 tablet by mouth 2 (two) times daily. (Patient not taking: Reported on 07/06/2022)   No facility-administered medications prior to visit.    Review of Systems  Constitutional:  Positive for activity change and fatigue. Negative for chills and fever.       Weakness   HENT:  Positive for congestion and postnasal drip. Negative for rhinorrhea, sinus pressure, sinus pain, sneezing and sore throat.   Eyes: Negative.   Respiratory:  Positive for cough, shortness of breath and wheezing.   Cardiovascular:  Negative for chest pain and palpitations.  Gastrointestinal:  Negative for constipation, diarrhea, nausea and vomiting.  Endocrine: Negative for cold intolerance, heat intolerance, polydipsia and polyuria.       Patient sees endocrinology for management of diabetes   Genitourinary:  Negative for dysuria, frequency and urgency.  Musculoskeletal:  Positive for arthralgias, back pain, gait problem, joint swelling, myalgias and neck pain.  Skin:  Positive for wound. Negative for rash.       Wound on lateral aspect of right great toe. Oozing small amount of clear fluid. Non tender.   Allergic/Immunologic: Positive for environmental allergies.  Neurological:  Negative for dizziness, weakness and headaches.  Psychiatric/Behavioral:  The patient is not nervous/anxious.        Objective     Today's Vitals   08/01/22 1044  BP: 134/64  Pulse: 68  SpO2: 94%  Weight: 253 lb 1.9 oz (114.8 kg)  Height: 6\' 2"  (1.88 m)   Body mass index is 32.5 kg/m.  BP Readings from Last 3 Encounters:  08/01/22 134/64  07/06/22 132/72  06/28/22 110/62    Wt Readings from Last 3 Encounters:  08/01/22 253 lb 1.9 oz (114.8 kg)  07/06/22 257 lb (116.6 kg)  06/28/22 256 lb 12.8 oz (116.5 kg)     Physical Exam Vitals and nursing note reviewed.  Constitutional:      Appearance: Normal appearance. He is well-developed. He is ill-appearing.   HENT:     Head: Normocephalic and atraumatic.     Nose: Congestion present.     Mouth/Throat:     Mouth: Mucous membranes are moist.     Pharynx: Oropharynx is clear.  Eyes:     Extraocular Movements: Extraocular movements intact.     Conjunctiva/sclera: Conjunctivae normal.     Pupils: Pupils are equal, round, and reactive to light.  Cardiovascular:     Rate and Rhythm: Normal rate. Rhythm irregular.     Pulses:          Dorsalis pedis pulses are 1+ on the right side and 1+ on the left side.       Posterior tibial pulses are 1+ on the left side.     Heart sounds: Normal heart sounds.  Pulmonary:     Effort: Pulmonary effort is normal.     Breath sounds: Wheezing and rhonchi present.  Abdominal:     Palpations: Abdomen is soft.  Musculoskeletal:     Left hand: Swelling, deformity, tenderness and bony tenderness present. Decreased range of motion.       Arms:     Cervical back: Normal range of motion and neck supple.     Right foot: Decreased range of motion. Foot drop present.       Feet:     Comments: Generalized joint pain with point tenderness present. Some joints with mild to moderate swelling.  Moderate weakness in both lower extremities.  -gait unstable. Currently using a cane to help with ambulation and balance. Patient unsteady.   Feet:     Right foot:     Protective Sensation: 10 sites tested.  6 sites sensed.     Skin integrity: Dry skin present.     Toenail Condition: Right toenails are abnormally thick and long.     Left foot:     Protective Sensation: 10 sites tested.  6 sites sensed.     Skin integrity: Dry skin present.     Toenail Condition: Left toenails are abnormally thick and long.  Lymphadenopathy:     Cervical: No cervical adenopathy.  Skin:    General: Skin is warm and dry.     Capillary Refill: Capillary refill takes less than 2 seconds.  Neurological:     General: No focal deficit present.     Mental Status: He is alert and oriented to  person, place, and time.     Cranial Nerves: No cranial nerve deficit.     Motor: Weakness present.     Coordination: Coordination abnormal.     Gait: Gait abnormal.     Comments: Moderate coarse tremor of bilateral upper extremities.   Psychiatric:        Mood and  Affect: Mood normal.        Behavior: Behavior normal.        Thought Content: Thought content normal.        Judgment: Judgment normal.     Last depression screening scores   Row Labels 06/27/2022   11:15 AM 03/02/2022   11:06 AM 02/17/2022    2:12 PM  PHQ 2/9 Scores   Section Header. No data exists in this row.     PHQ - 2 Score    2 2  PHQ- 9 Score    9 10  Exception Documentation   Patient refusal     Last fall risk screening   Row Labels 07/06/2022   10:25 AM  [place]. No data exists in this row.   Falls in the past year?   1  Number falls in past yr:   1  Injury with Fall?   0  Follow up   Falls evaluation completed    Results for orders placed or performed in visit on 08/01/22  POCT UA - Microalbumin  Result Value Ref Range   Microalbumin Ur, POC 150 mg/L   Creatinine, POC 50 mg/dL   Albumin/Creatinine Ratio, Urine, POC >300     Assessment & Plan    1. Encounter for general adult medical examination with abnormal findings Annual physical today   2. Other secondary acute gout of left hand Advised patient to take colchicine 0.6 mg twice daily for next three days. Return to taking it once daily to prevent further gout flares. Will check uric acid level today. If elevated, consider changing colchicine to uloric for further treatment.   - Uric acid; Future - DME Wheelchair electric - Uric acid  3. Cellulitis of right toe Treat with bactrim DS twice daily for 14 days. Encouraged him to soak the foot in warm water and epsom salt for 20 minutes one to two times daily. May use previously prescribed bactroban ointment twice daily.  - sulfamethoxazole-trimethoprim (BACTRIM DS) 800-160 MG  tablet; Take 1 tablet by mouth 2 (two) times daily.  Dispense: 28 tablet; Refill: 0  4. Weakness Moderate to severe weakness. Getting worse. Having frequent falls. New request for electric wheelchair will be sent to DME provider.  - DME Wheelchair electric  5. Tremor Worsening coarse tremor of bilateral upper extremities. Having frequent falls. New request for electric wheelchair will be sent to DME provider.  - DME Wheelchair electric  6. Other fatigue Check thyroid panel and cbc  - TSH + free T4; Future - CBC; Future - CBC - TSH + free T4  7. Shortness of breath Worsening shortness of breath. Sees pulmonology. Making it difficult for him to ambulate. Having frequent falls. New request for electric wheelchair will be sent to DME provider.  - DME Wheelchair electric  8. Essential hypertension Routine, fasting labs drawn during today's visit.  - Lipid panel; Future - Comprehensive metabolic panel; Future - CBC; Future - CBC - Comprehensive metabolic panel - Lipid panel  9. Longstanding persistent atrial fibrillation (HCC) Routine, fasting labs drawn during today's visit. Continue regular visits with cardiology as scheduled.  - Lipid panel; Future - Comprehensive metabolic panel; Future - CBC; Future - DME Wheelchair electric - CBC - Comprehensive metabolic panel - Lipid panel  10. Pharyngoesophageal dysphagia Check thyroid panel  - TSH + free T4; Future - TSH + free T4  11. Type 2 diabetes mellitus with stage 3a chronic kidney disease, with long-term  current use of insulin [facility]) Patient should continue regular visits with endocrinology. Urine microalbumin abnormal today.  - POCT UA - Microalbumin   Immunization History  Administered Date(s) Administered   Influenza Split 01/30/2011, 03/05/2012, 02/13/2013, 02/03/2016, 02/20/2019   Influenza Whole 02/13/2009   Influenza,inj,Quad PF,6+ Mos 03/14/2014, 04/22/2015, 01/19/2016, 02/02/2017, 02/05/2019, 05/01/2020,  04/19/2021   Influenza-Unspecified 01/30/2011, 03/05/2012, 02/13/2013, 03/14/2014, 04/22/2015, 01/19/2016, 02/03/2016, 02/02/2017, 02/13/2018   Pneumococcal Polysaccharide-23 02/14/2007    Health Maintenance  Topic Date Due   COVID-19 Vaccine (1) Never done   OPHTHALMOLOGY EXAM  Never done   DTaP/Tdap/Td (1 - Tdap) Never done   Zoster Vaccines- Shingrix (1 of 2) Never done   INFLUENZA VACCINE  12/14/2021   FOOT EXAM  12/15/2022   HEMOGLOBIN A1C  12/27/2022   Diabetic kidney evaluation - eGFR measurement  03/02/2023   COLONOSCOPY (Pts 45-34yrs Insurance coverage will need to be confirmed)  03/03/2023   Lung Cancer Screening  07/19/2023   Diabetic kidney evaluation - Urine ACR  08/01/2023   Hepatitis C Screening  Completed   HIV Screening  Completed   HPV VACCINES  Aged Out    Discussed health benefits of physical activity, and encouraged him to engage in regular exercise appropriate for his age and condition.  Problem List Items Addressed This Visit       Cardiovascular and Mediastinum   Essential hypertension   Relevant Orders   Lipid panel   Comprehensive metabolic panel   CBC   Longstanding persistent atrial fibrillation (HCC)   Relevant Orders   Lipid panel   Comprehensive metabolic panel   CBC   DME Wheelchair electric     Digestive   Dysphagia   Relevant Orders   TSH + free T4     Endocrine   Diabetes ([place])   Relevant Orders   POCT UA - Microalbumin (Completed)     Musculoskeletal and Integument   Cellulitis of right toe   Relevant Medications   sulfamethoxazole-trimethoprim (BACTRIM DS) 800-160 MG tablet     Other   Other fatigue   Relevant Orders   TSH + free T4   CBC   Shortness of breath   Relevant Orders   DME Wheelchair electric   Tremor   Relevant Orders   DME Wheelchair electric   Gout   Relevant Orders   Uric acid   DME Wheelchair electric   Other Visit Diagnoses     Encounter for general adult medical examination with abnormal  findings    -  Primary        Return in about 6 months (around 02/01/2023) for blood pressure, mood.        Ronnell Freshwater, NP  [facility] at [facility] [phone] (phone) [phone] (fax)  [place]

## 2022-08-01 NOTE — Telephone Encounter (Signed)
Advised patient biopsy on the left scalp was benign.

## 2022-08-02 ENCOUNTER — Other Ambulatory Visit: Payer: Self-pay | Admitting: Nurse Practitioner

## 2022-08-02 LAB — COMPREHENSIVE METABOLIC PANEL
ALT: 11 IU/L (ref 0–44)
AST: 29 IU/L (ref 0–40)
Albumin/Globulin Ratio: 1.5 (ref 1.2–2.2)
Albumin: 4.1 g/dL (ref 3.9–4.9)
Alkaline Phosphatase: 161 IU/L — ABNORMAL HIGH (ref 44–121)
BUN/Creatinine Ratio: 22 (ref 10–24)
BUN: 28 mg/dL — ABNORMAL HIGH (ref 8–27)
Bilirubin Total: 0.6 mg/dL (ref 0.0–1.2)
CO2: 23 mmol/L (ref 20–29)
Calcium: 8.8 mg/dL (ref 8.6–10.2)
Chloride: 100 mmol/L (ref 96–106)
Creatinine, Ser: 1.25 mg/dL (ref 0.76–1.27)
Globulin, Total: 2.8 g/dL (ref 1.5–4.5)
Glucose: 249 mg/dL — ABNORMAL HIGH (ref 70–99)
Potassium: 3.8 mmol/L (ref 3.5–5.2)
Sodium: 143 mmol/L (ref 134–144)
Total Protein: 6.9 g/dL (ref 6.0–8.5)
eGFR: 64 mL/min/{1.73_m2} (ref >59)

## 2022-08-02 LAB — LIPID PANEL
Chol/HDL Ratio: 6.7 ratio — ABNORMAL HIGH (ref 0.0–5.0)
Cholesterol, Total: 154 mg/dL (ref 100–199)
HDL: 23 mg/dL — ABNORMAL LOW (ref >39)
LDL Chol Calc (NIH): 63 mg/dL (ref 0–99)
Triglycerides: 437 mg/dL — ABNORMAL HIGH (ref 0–149)
VLDL Cholesterol Cal: 68 mg/dL — ABNORMAL HIGH (ref 5–40)

## 2022-08-02 LAB — CBC
Hematocrit: 34.2 % — ABNORMAL LOW (ref 37.5–51.0)
Hemoglobin: 11.4 g/dL — ABNORMAL LOW (ref 13.0–17.7)
MCH: 29.9 pg (ref 26.6–33.0)
MCHC: 33.3 g/dL (ref 31.5–35.7)
MCV: 90 fL (ref 79–97)
Platelets: 125 10*3/uL — ABNORMAL LOW (ref 150–450)
RBC: 3.81 x10E6/uL — ABNORMAL LOW (ref 4.14–5.80)
RDW: 15.5 % — ABNORMAL HIGH (ref 11.6–15.4)
WBC: 4.9 10*3/uL (ref 3.4–10.8)

## 2022-08-02 LAB — TSH+FREE T4
Free T4: 1.51 ng/dL (ref 0.82–1.77)
TSH: 2.99 u[IU]/mL (ref 0.450–4.500)

## 2022-08-02 LAB — URIC ACID: Uric Acid: 11.3 mg/dL — ABNORMAL HIGH (ref 3.8–8.4)

## 2022-08-03 ENCOUNTER — Ambulatory Visit (HOSPITAL_COMMUNITY)
Admission: RE | Admit: 2022-08-03 | Discharge: 2022-08-03 | Disposition: A | Discharge status: Home / Self Care | Payer: Commercial Managed Care - HMO | Source: Ambulatory Visit | Attending: Neurology | Admitting: Neurology

## 2022-08-03 ENCOUNTER — Ambulatory Visit (HOSPITAL_COMMUNITY)
Admission: RE | Admit: 2022-08-03 | Discharge: 2022-08-03 | Disposition: A | Discharge status: Home / Self Care | Payer: Commercial Managed Care - HMO | Source: Ambulatory Visit | Attending: Nurse Practitioner | Admitting: Nurse Practitioner

## 2022-08-03 ENCOUNTER — Ambulatory Visit (HOSPITAL_BASED_OUTPATIENT_CLINIC_OR_DEPARTMENT_OTHER): Payer: Commercial Managed Care - HMO | Admitting: Pulmonary Disease

## 2022-08-03 DIAGNOSIS — R1310 Dysphagia, unspecified: Secondary | ICD-10-CM

## 2022-08-03 DIAGNOSIS — I110 Hypertensive heart disease with heart failure: Secondary | ICD-10-CM | POA: Diagnosis not present

## 2022-08-03 DIAGNOSIS — G4733 Obstructive sleep apnea (adult) (pediatric): Secondary | ICD-10-CM | POA: Insufficient documentation

## 2022-08-03 DIAGNOSIS — K7460 Unspecified cirrhosis of liver: Secondary | ICD-10-CM | POA: Insufficient documentation

## 2022-08-03 DIAGNOSIS — Z9049 Acquired absence of other specified parts of digestive tract: Secondary | ICD-10-CM | POA: Diagnosis not present

## 2022-08-03 DIAGNOSIS — J4489 Other specified chronic obstructive pulmonary disease: Secondary | ICD-10-CM | POA: Insufficient documentation

## 2022-08-03 DIAGNOSIS — I2510 Atherosclerotic heart disease of native coronary artery without angina pectoris: Secondary | ICD-10-CM | POA: Diagnosis not present

## 2022-08-03 DIAGNOSIS — E1142 Type 2 diabetes mellitus with diabetic polyneuropathy: Secondary | ICD-10-CM | POA: Diagnosis not present

## 2022-08-03 DIAGNOSIS — I4891 Unspecified atrial fibrillation: Secondary | ICD-10-CM | POA: Insufficient documentation

## 2022-08-03 DIAGNOSIS — R1312 Dysphagia, oropharyngeal phase: Secondary | ICD-10-CM | POA: Diagnosis present

## 2022-08-03 NOTE — Therapy (Signed)
Modified Barium Swallow Study  Patient Details  Name: [name] MRN: [id number] Date of Birth: 10/05/1955  Today's Date: 08/03/2022  Modified Barium Swallow completed.  Full report located under Chart Review in the Imaging Section.  History of Present Illness [name] is a 67 y.o. 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 During OP visit to neurologist, patient c/o dysphagia with patient reporting an ENT had told him his uvula was causing problems with food getting stuck. EGD in 2021 was without abnormality except for mild inflammation within stomach.   Clinical Impression Patient presents with a very mild oropharyngeal dysphagia resulting only in bolus residuals of mechanical soft solids remaining on tongue base after initial swallow. Patient does have sensation to this and was able to clear with sip of thin liquid barium. No penetration, aspiration or pharyngeal residuals observed with any of the tested barium consistencies (nectar and honey thick, thin, 13 mm barium tablet, mechanical soft solid). Appearance of bulging of posterior pharynx observed which did not appear to significantly impede bolus transit. Patient able to swallow 100mm barium tablet without any liquid. Esophageal sweep did not reveal any barium stasis and tablet transited without difficulty. SLP recommended patient alternate bites of solids with sips of liquids and to avoid foods that are difficult to masticate (he has no teeth and no dentures) and avoid foods that are very dry. (he complains mostly of difficulty with foods such as breads) No further SLP intervention or evaluation warranted at this time.  Swallow Evaluation Recommendations Recommendations: PO diet PO Diet Recommendation: Dysphagia 3 (Mechanical soft);Thin liquids (Level 0) Liquid Administration via: Cup;Straw Medication Administration: Whole  meds with liquid Supervision: Patient able to self-feed Swallowing strategies  : Small bites/sips;Slow rate Postural changes: Stay upright 30-60 min after meals;Position pt fully upright for meals      Sonia Baller, MA, CCC-SLP Speech Therapy

## 2022-08-04 ENCOUNTER — Encounter: Payer: Self-pay | Admitting: Internal Medicine

## 2022-08-04 ENCOUNTER — Ambulatory Visit (INDEPENDENT_AMBULATORY_CARE_PROVIDER_SITE_OTHER): Payer: Commercial Managed Care - HMO | Admitting: Internal Medicine

## 2022-08-04 VITALS — BP 126/68 | HR 68 | Temp 97.8°F | Ht 74.0 in | Wt 255.2 lb

## 2022-08-04 DIAGNOSIS — J449 Chronic obstructive pulmonary disease, unspecified: Secondary | ICD-10-CM

## 2022-08-04 DIAGNOSIS — F17210 Nicotine dependence, cigarettes, uncomplicated: Secondary | ICD-10-CM

## 2022-08-04 MED ORDER — PANTOPRAZOLE SODIUM 40 MG PO TBEC: 40.0000 mg | DELAYED_RELEASE_TABLET | Freq: Every day | ORAL | 11 refills | Status: DC

## 2022-08-04 MED ORDER — FLUTICASONE FUROATE-VILANTEROL 100-25 MCG/ACT IN AEPB: 1.0000 | INHALATION_SPRAY | Freq: Every morning | RESPIRATORY_TRACT | 11 refills | Status: DC

## 2022-08-04 MED ORDER — PREDNISONE 10 MG PO TABS: ORAL_TABLET | ORAL | 0 refills | Status: DC

## 2022-08-04 MED ORDER — FAMOTIDINE 20 MG PO TABS: ORAL_TABLET | ORAL | 11 refills | Status: AC

## 2022-08-04 NOTE — Patient Instructions (Signed)
Stop anoro and all smoking  Plan A = Automatic = Always=    BREO 100 one each am    Plan B = Backup (to supplement plan A, not to replace it) Only use your albuterol inhaler as a rescue medication to be used if you can't catch your breath by resting or doing a relaxed purse lip breathing pattern.  - The less you use it, the better it will work when you need it. - Ok to use the inhaler up to 2 puffs  every 4 hours if you must but call for appointment if use goes up over your usual need - Don't leave home without it !!  (think of it like the spare tire for your car)   Plan C = Crisis (instead of Plan B but only if Plan B stops working) - only use your albuterol nebulizer if you first try Plan B and it fails to help > ok to use the nebulizer up to every 4 hours but if start needing it regularly call for immediate appointment   Plan D = Deltasone  If ABC aren't working well, take prednisone 10 mg x 2 each am with breakfast, then 1 daily x 3 days and stop   Please schedule a follow up office visit in 6 weeks, call sooner if needed with all medications /inhalers/ solutions in hand so we can verify exactly what you are taking. This includes all medications from all doctors and over the counters

## 2022-08-04 NOTE — Progress Notes (Signed)
Subjective:    Patient ID: [name], male    DOB: 1956-04-19    MRN: [id number]  HPI PCP - [name]  56/M, smoker with atrial fibrillation for FU of COPD & sleep apnea.  He had atrial fibrillation treated with flecainide in the past. He had TEE/DCCV. He is maintaining sinus rhythm,is on anticoagulation. 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  He reports episodes of coughing which have made him 'pass out'. He reports sinus drainage for many years but has never seen ENT.  He does have severe sleep apnea but couldn't afford CPAP. Reviewed PSG from [facility] - severe with AHI 31.h corrected by CPAP 5 cm (wt was 250 lbs)  He smokes 1 PPD, drinks alcohol -'on weekends' - but I suspect more.   11/22/11 pre-op clearance for neck sx >> given course of prednisone for asthmatic bronchitis  dulera 100/5  Can use albuterol as needed.  Was set up with cpap  4-5 cigs/d, smoked 1/2 pack today due to stress CXR 11/21/11 no infx/ effusion He is not compliant with dulera, He has no income now & is unable to afford expensive meds Med review shows lisinopril & propranolol -which may be affecting his breathing rec STOP taking zestoretic Take HCTZ 25 mg daily instead I will ask your heart doctor to change propranolol to another medication You have to STOP smoking Stay on dulera 2 puffs twice daily until surgery  Take albuterol 2 puffs for emergency Stay on CPAP every night & right after surgery   12/23/2011 ov/acute work in [place], still smoking, lost instructions and may still be on ace, very hoarse, inhalers not helping.  No unusual cough, purulent sputum or sinus/hb symptoms on present rx. Chest feels generally tighter x 2 weeks and difficulty lying down and night due to sob. Rec STOP taking zestoretic Take HCTZ 25 mg daily instead  change propranolol to Bystolic 10 mg one dialy  You  have to STOP smoking before smoking stops you Stay on dulera 2 puffs in am and 2 puffs in pm  Take albuterol 2 puffs for emergency Prednisone 10 mg take  4 each am x 2 days,   2 each am x 2 days,  1 each am x2days and stop  Stop fish oil for now   08/04/2022  Re-establish ov/[name] re: Mild/mod AS/ AB still smoking maint on Anoro once each hs Chief Complaint  Patient presents with   Consult    Patient switching from [name].  Does not want to go to [place] location.  C/o chest congestion and dyspnea started yesterday (08/03/2022).  Patient has tremor in both hands - he is unable to use the ellipta inhalers.  Dyspnea:  limited more by pain than breathing  Cough: clear mucus,worse x 24 h PTA Sleeping: flat bed, right side down down, one pillow  SABA use: no neb in months/ inhaler several days prior to OV   02: none  Covid status:   vax x 3  Lung cancer screening :  q march     No obvious day to day or daytime variability or assoc excess/ purulent sputum or mucus plugs or hemoptysis or cp or chest tightness, subjective wheeze or overt sinus or hb symptoms.     Also denies any obvious fluctuation of symptoms with weather or environmental changes or other aggravating or alleviating factors  except as outlined above   No unusual exposure hx or h/o childhood pna/ asthma or knowledge of premature birth.  Current Allergies, Complete Past Medical History, Past Surgical History, Family History, and Social History were reviewed in [employer] record.  ROS  The following are not active complaints unless bolded Hoarseness, sore throat, dysphagia, dental problems, itching, sneezing,  nasal congestion or discharge of excess mucus or purulent secretions, ear ache,   fever, chills, sweats, unintended wt loss or wt gain, classically pleuritic or exertional cp,  orthopnea pnd or arm/hand swelling  or leg swelling, presyncope, palpitations, abdominal pain, anorexia, nausea, vomiting,  diarrhea  or change in bowel habits or change in bladder habits, change in stools or change in urine, dysuria, hematuria,  rash, arthralgias, visual complaints, headache, numbness, weakness or ataxia or problems with walking/rollator or coordination,  change in mood or  memory.        Current Meds  Medication Sig   albuterol (PROAIR HFA) 108 (90 Base) MCG/ACT inhaler Inhale 1-2 puffs into the lungs every 6 (six) hours as needed for wheezing or shortness of breath.   albuterol (PROVENTIL) (2.5 MG/3ML) 0.083% nebulizer solution Take 3 mLs (2.5 mg total) by nebulization every 6 (six) hours as needed for wheezing or shortness of breath.   b complex vitamins tablet Take 1 tablet by mouth daily.    bisoprolol (ZEBETA) 10 MG tablet Take 10 mg by mouth daily.    colchicine 0.6 MG tablet TAKE 2 TABLETS BY MOUTH AT ONCE. THEN TAKE 1 TABLET 1 HOUR LATER. WAIT 2 DAYS, THEN BEGIN TAKING 1 TABLET DAILY TO PREVENT ACUTE GOUT.   Continuous Blood Gluc Sensor (DEXCOM G7 SENSOR) MISC 3 each by Does not apply route every 30 (thirty) days. Apply 1 sensor every 10 days   Continuous Blood Gluc Transmit (DEXCOM G6 TRANSMITTER) MISC USE AS DIRECTED   diltiazem (CARDIZEM CD) 120 MG 24 hr capsule Take 1 capsule (120 mg total) by mouth daily.   Dulaglutide (TRULICITY) 3 0000000 SOPN Inject 3 mg as directed once a week.   gabapentin (NEURONTIN) 300 MG capsule TAKE 3 CAPSULES BY MOUTH TWICE DAILY FOR NEUROPATHY   Glucagon 3 MG/DOSE POWD Place 3 mg into the nose once as needed for up to 1 dose.   Insulin Pen Needle (EASY COMFORT PEN NEEDLES) 31G X 5 MM MISC To use with as needed insulin dosing up to four times daily   insulin regular human CONCENTRATED (HUMULIN R U-500 KWIKPEN) 500 UNIT/ML KwikPen Inject 260 Units into the skin daily. Use up to 250-260 units of insulin daily as advised   ketoconazole (NIZORAL) 2 % cream Apply 1 Application topically daily.   ketoconazole (NIZORAL) 2 % shampoo Apply 1 Application topically 2  (two) times a week. 2x/wk, let sit 5 minutes and rinse off, wash from scalp to waist, avoid eyes   losartan (COZAAR) 50 MG tablet TAKE 1 TABLET BY MOUTH DAILY   metolazone (ZAROXOLYN) 2.5 MG tablet TAKE 1 TABLET BY MOUTH ON TUESDAY AND THURSDAY 30 MINUTES BEFORE TAKING TORSEMIDE OR AS DIRECTED   mupirocin ointment (BACTROBAN) 2 % Ply small amount to effected area twice daily for 10 days.   nitroGLYCERIN (NITROSTAT) 0.4 MG SL tablet Place 1 tablet (0.4 mg total) under the tongue every 5 (five) minutes x 3 doses as needed for chest pain.   omeprazole (PRILOSEC) 20 MG capsule TAKE 1 CAPSULE BY MOUTH TWICE DAILY   oxyCODONE-acetaminophen (PERCOCET) 10-325 MG tablet Take 1.5 tablets  by mouth in the morning, at noon, in the evening, and at bedtime.   potassium chloride SA (KLOR-CON M) 20 MEQ tablet TAKE 1 TABLET BY MOUTH DAILY. TAKE AN EXTRA TABLET ON TUESDAY AND THURSDAY   sulfamethoxazole-trimethoprim (BACTRIM DS) 800-160 MG tablet Take 1 tablet by mouth 2 (two) times daily.   tamsulosin (FLOMAX) 0.4 MG CAPS capsule TAKE 1 CAPSULE BY MOUTH EACH NIGHT AT BEDTIME   torsemide (DEMADEX) 20 MG tablet Take 2 tablets in the am and 1 tablet in the pm   triamcinolone cream (KENALOG) 0.1 % Apply 1 Application topically daily as needed.   VITAMIN A PO Take 2,400 mcg by mouth daily.    Vitamin D, Ergocalciferol, (DRISDOL) 1.25 MG (50000 UNIT) CAPS capsule TAKE 1 CAPSULE BY MOUTH EVERY 7 DAYS   warfarin (COUMADIN) 5 MG tablet Take 1/2 to 1 tablet daily or as directed               Objective:   Physical Exam    Wt Readings from Last 3 Encounters:  08/04/22 255 lb 3.2 oz (115.8 kg)  08/01/22 253 lb 1.9 oz (114.8 kg)  07/06/22 257 lb (116.6 kg)      Vital signs reviewed  08/04/2022  - Note at rest 02 sats  95% on RA   General appearance:   tremulous amb wm thoroughly confused with details of care    HEENT : Oropharynx  clear/ edentulous/ not thrush        NECK :  without  apparent JVD/ palpable  Nodes/TM    LUNGS: no acc muscle use,  Min barrel  contour chest wall with  Insp/exp rhonchi with upper lobe predominance and  without cough on insp or exp maneuvers and min  Hyperresonant  to  percussion bilaterally    CV:  RRR  no s3  2/6 SEM or increase in P2, and no edema   ABD:  soft and nontender with pos end  insp Hoover's  in the supine position.  No bruits or organomegaly appreciated   MS:  Nl gait/ ext warm without deformities Or obvious joint restrictions  calf tenderness, cyanosis or clubbing     SKIN: warm and dry without lesions    NEURO:  alert, approp, nl sensorium with  no motor or cerebellar deficits apparent.           I personally reviewed images and agree with radiology impression as follows:   Chest LDSCT     07/19/22 1. Lung-RADS 2S, benign appearance or behavior. Continue annual screening with low-dose chest CT without contrast in 12 months. 2. The "S" modifier above refers to potentially clinically significant non lung cancer related findings. Specifically, there is aortic atherosclerosis, in addition to left main and three-vessel coronary artery disease.  3. Mild diffuse bronchial wall thickening with very mild centrilobular and paraseptal emphysema; imaging findings suggestive of underlying COPD. There are calcifications of the aortic valve and mitral annulus         Assessment & Plan:

## 2022-08-05 ENCOUNTER — Encounter: Payer: Self-pay | Admitting: Internal Medicine

## 2022-08-05 ENCOUNTER — Telehealth: Payer: Self-pay | Admitting: *Deleted

## 2022-08-05 ENCOUNTER — Telehealth: Payer: Self-pay

## 2022-08-05 ENCOUNTER — Telehealth: Payer: Self-pay | Admitting: Internal Medicine

## 2022-08-05 DIAGNOSIS — F17210 Nicotine dependence, cigarettes, uncomplicated: Secondary | ICD-10-CM | POA: Insufficient documentation

## 2022-08-05 MED ORDER — ANORO ELLIPTA 62.5-25 MCG/ACT IN AEPB: 1.0000 | INHALATION_SPRAY | Freq: Every day | RESPIRATORY_TRACT | 3 refills | Status: DC

## 2022-08-05 NOTE — Assessment & Plan Note (Addendum)
Counseled re importance of smoking cessation but did not meet time criteria for separate billing    Each maintenance medication was reviewed in detail including emphasizing most importantly the difference between maintenance and prns and under what circumstances the prns are to be triggered using an action plan format where appropriate.  Total time for H and P, chart review, counseling, reviewing hfa/dpi/neb device(s) and generating customized AVS unique to this office visit / same day charting = 33 m with pt re-established p 10 years absence

## 2022-08-05 NOTE — Telephone Encounter (Signed)
Spoke with patient He advises insurance wont cover Breo anymore copay is now $200. He states he previously used Anoro and this was covered. Dr. Melvyn Novas is this okay to send in?  Pharmacy [facility]

## 2022-08-05 NOTE — Telephone Encounter (Signed)
Advised patient [name] has been to sent to pharmacy. Also advised patient to bring in meds and insurance formulary at next Ov. Patient verbalized understanding. Nothing further needed.

## 2022-08-05 NOTE — Assessment & Plan Note (Signed)
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 2) If the pt is convinced, as many are, that saba helps recover from activity faster then it's easy to tell if this is the case by re-challenging : ie stop, take the inhaler, then p 5 minutes try the exact same activity (intensity of workload) that just caused the symptoms and see if they are substantially diminished or not after saba 3) if there is an activity that reproducibly causes the symptoms, try the saba 15 min before the activity on alternate days   If in fact the saba really does help, then fine to continue to use it prn but advised may need to look closer at the maintenance regimen being used (was [facility], now trying BREO 100) to achieve better control of airways disease with exertion.

## 2022-08-05 NOTE — Telephone Encounter (Signed)
[facility] for now but need return with all meds and insurance formulary for best fit

## 2022-08-05 NOTE — Telephone Encounter (Signed)
Pt is requesting a increased dosage of gabapentin (NEURONTIN) 300 MG capsule .  Please advise

## 2022-08-05 NOTE — Telephone Encounter (Signed)
Patient states [employer]air Patter is not covered by insurance/ States would like Anoro called into pharmacy.States has one Anoro left. Pharmacy is [facility]. Patient phone number is [phone].

## 2022-08-05 NOTE — Telephone Encounter (Signed)
  sulfamethoxazole-trimethoprim (BACTRIM DS) 800-160 MG tablet    Pt called and stated they started abx 3 days ago and it is a 2 week course. Pt is going to come in on Monday 3/25 to have INR checked. Informed pt it can have an affect on INR and to eat extra serving of greens over the weekend.

## 2022-08-08 ENCOUNTER — Ambulatory Visit: Payer: Commercial Managed Care - HMO | Attending: Cardiovascular Disease | Admitting: Pharmacist

## 2022-08-08 DIAGNOSIS — Z7901 Long term (current) use of anticoagulants: Secondary | ICD-10-CM

## 2022-08-08 DIAGNOSIS — I4892 Unspecified atrial flutter: Secondary | ICD-10-CM | POA: Diagnosis not present

## 2022-08-08 DIAGNOSIS — I480 Paroxysmal atrial fibrillation: Secondary | ICD-10-CM

## 2022-08-08 LAB — POCT INR: INR: 1.9 — AB (ref 2.0–3.0)

## 2022-08-08 NOTE — Patient Instructions (Signed)
Description   Continue taking warfarin 1/2 tablet daily except for 1 tablet every Mondays and Fridays. Recheck INR in 3 weeks. Anticoagulation Clinic [phone]

## 2022-08-08 NOTE — Telephone Encounter (Signed)
Talk to provider about this on 08/05/2022 provider stated this will be addressed

## 2022-08-09 ENCOUNTER — Telehealth: Payer: Self-pay

## 2022-08-09 DIAGNOSIS — I10 Essential (primary) hypertension: Secondary | ICD-10-CM

## 2022-08-09 MED ORDER — BISOPROLOL FUMARATE 10 MG PO TABS: 10.0000 mg | ORAL_TABLET | Freq: Every day | ORAL | 1 refills | Status: AC

## 2022-08-09 NOTE — Telephone Encounter (Signed)
Pt is requesting a refill on: bisoprolol (ZEBETA) 10 MG tablet   Pharmacy: [facility], [place]   LOV 08/01/22 ROV 02/01/23

## 2022-08-09 NOTE — Telephone Encounter (Signed)
Refill sent.

## 2022-08-10 ENCOUNTER — Other Ambulatory Visit: Payer: Self-pay | Admitting: Nurse Practitioner

## 2022-08-10 DIAGNOSIS — E119 Type 2 diabetes mellitus without complications: Secondary | ICD-10-CM

## 2022-08-10 DIAGNOSIS — M1A29X1 Drug-induced chronic gout, multiple sites, with tophus (tophi): Secondary | ICD-10-CM

## 2022-08-10 MED ORDER — GABAPENTIN 300 MG PO CAPS: ORAL_CAPSULE | ORAL | 2 refills | Status: DC

## 2022-08-10 MED ORDER — FEBUXOSTAT 40 MG PO TABS: 40.0000 mg | ORAL_TABLET | Freq: Every day | ORAL | 3 refills | Status: DC

## 2022-08-10 NOTE — Telephone Encounter (Signed)
Called pt he is advised of his Rx's that was sent to the pharmacy

## 2022-08-10 NOTE — Telephone Encounter (Signed)
Please let the patient know that I increased the dose of gabapentin to 3 capsules three times daily, up from twice daily. I have also added uloric 40 mg daily to help lower uric acid levels, as I do think gout and higher uric acid levels are causing pain/swelling. Both prescriptions were sent to [facility].

## 2022-08-22 ENCOUNTER — Ambulatory Visit: Payer: Commercial Managed Care - HMO | Admitting: Orthopaedic Surgery

## 2022-08-22 ENCOUNTER — Encounter: Payer: Self-pay | Admitting: Orthopaedic Surgery

## 2022-08-22 ENCOUNTER — Other Ambulatory Visit: Payer: Self-pay

## 2022-08-22 VITALS — BP 120/67 | HR 71 | Ht 74.0 in | Wt 250.0 lb

## 2022-08-22 DIAGNOSIS — M545 Low back pain, unspecified: Secondary | ICD-10-CM

## 2022-08-22 NOTE — Progress Notes (Unsigned)
Office Visit Note   Patient: [name]           Date of Birth: March 05, 1956           MRN: [id number] Visit Date: 08/22/2022              Requested by: Carlean Jews, NP [address] [place],  [place] [address] PCP: Carlean Jews, NP   Assessment & Plan: Visit Diagnoses:  1. Acute bilateral low back pain, unspecified whether sciatica present     Plan: Patient has slight weakness of ankle and toe dorsiflexion on the right but is strong enough is likely would not wear his AFO if he had 1.  We discussed cutting back on smoking trying to lose a little bit of weight gradually.  He should always sit in the chair that has arms and tire and out of the lower chair.  We reviewed previous imaging studies.  He is on Uloric for his gout which seems to be under good control.  Patient states still in considerable pain although he is taking 60 mg of oxycodone daily.  I discussed with him upping his pain medication is not going to help.  Patient is continue to ambulate with a cane we discussed fall prevention.  Follow-Up Instructions: No follow-ups on file.   Orders:  Orders Placed This Encounter  Procedures   XR Lumbar Spine 2-3 Views   No orders of the defined types were placed in this encounter.     Procedures: No procedures performed   Clinical Data: No additional findings.   Subjective: Chief Complaint  Patient presents with   Lower Back - Pain    HPI 67 year old male seen with chronic back pain recently has had trouble getting from sitting to standing.  He is on chronic Percocet 10 mg taking 6 a day some days he states last he states.  He is followed twice he has a little bit of weakness ankle dorsiflexion on the right.  Continues to smoke has heart failure atrial fibs low cardiac output has been told he cannot have any surgery by his cardiologist.  Review of Systems all systems noncontributory to HPI.   Objective: Vital Signs: BP 120/67   Pulse 71   Ht  6\' 2"  (1.88 m)   Wt 250 lb (113.4 kg)   BMI 32.10 kg/m   Physical Exam Constitutional:      Appearance: He is well-developed.  HENT:     Head: Normocephalic and atraumatic.     Right Ear: External ear normal.     Left Ear: External ear normal.  Eyes:     Pupils: Pupils are equal, round, and reactive to light.  Neck:     Thyroid: No thyromegaly.     Trachea: No tracheal deviation.  Cardiovascular:     Rate and Rhythm: Normal rate.  Pulmonary:     Effort: Pulmonary effort is normal.     Breath sounds: No wheezing.  Abdominal:     General: Bowel sounds are normal.     Palpations: Abdomen is soft.  Musculoskeletal:     Cervical back: Neck supple.  Skin:    General: Skin is warm and dry.     Capillary Refill: Capillary refill takes less than 2 seconds.  Neurological:     Mental Status: He is alert and oriented to person, place, and time.  Psychiatric:        Behavior: Behavior normal.  Thought Content: Thought content normal.        Judgment: Judgment normal.     Ortho Exam patient has great difficulty can get from sitting standing without using the arms on the chair.  Once upright he walks with the hips in flexed position.  He has slight weakness ankle dorsiflexion on the right EHL as well left side strong plantarflexion normal.  Specialty Comments:  No specialty comments available.  Imaging: No results found.   PMFS History: Patient Active Problem List   Diagnosis Date Noted   Cigarette smoker 08/05/2022   Localized superficial mass 08/01/2022   Gout 08/01/2022   Tremor 03/20/2022   Cellulitis of right toe 03/06/2022   Neoplasm of uncertain behavior of skin of lower leg 01/31/2022   Cutaneous candidiasis 01/31/2022   Spinal stenosis, lumbar region, with neurogenic claudication 10/14/2021   S/P cervical spinal fusion 10/14/2021   Chronic pain of left knee 10/14/2021   Chronic painful diabetic neuropathy 10/14/2021   Abdominal discomfort 09/26/2021    Hepatomegaly 09/26/2021   Splenomegaly 09/26/2021   Atrophy of muscle of multiple sites 08/12/2021   Inflammatory polyarthropathy 08/12/2021   Chronic pain syndrome 08/12/2021   Depression, major, single episode, moderate 08/12/2021   Arthritis of left knee 06/08/2021   Acute medial meniscal tear, left, initial encounter    Chronic meniscal tear of knee 03/15/2021   Dysphagia 04/17/2020   LVH (left ventricular hypertrophy) 11/07/2019   Heme positive stool 10/03/2019   Anemia 10/03/2019   Insulin dependent type 2 diabetes mellitus 08/26/2019   Chronic diastolic heart failure 08/26/2019   Rash and nonspecific skin eruption 08/26/2019   Foot pain 08/22/2019   Right heart failure 08/07/2019   Leg swelling 07/28/2019   Educated about COVID-19 virus infection 07/28/2019   Diabetes 06/12/2019   Urticaria 05/15/2019   Dermatitis due to drug 04/18/2019   Edema 03/29/2019   Erectile dysfunction 03/29/2019   High risk medication use 03/29/2019   History of iron deficiency 03/29/2019   Pulmonary hypertension 03/29/2019   Venous stasis dermatitis of both lower extremities 03/29/2019   Mixed hyperlipidemia 02/14/2019   Onychomycosis due to dermatophyte 11/06/2018   Hammer toe of right foot 07/17/2018   Tinea pedis of both feet 07/17/2018   History of radiofrequency ablation (RFA) procedure for cardiac arrhythmia 06/21/2018   Lumbar radiculopathy 06/21/2018   Hepatic cirrhosis 12/26/2017   Left lower quadrant pain 12/05/2017   Change in bowel habits 12/05/2017   Palpitations 10/03/2017   Shortness of breath 04/12/2017   Chest pain 08/28/2016   Unstable angina 08/26/2016   Longstanding persistent atrial fibrillation 04/21/2015   Deviated nasal septum 02/19/2014   History of colon polyps 11/22/2012   Syncope 10/17/2012   Acute asthmatic bronchitis 11/22/2011   Benign neoplasm of colon 10/13/2011   Unspecified gastritis and gastroduodenitis without mention of hemorrhage 10/13/2011    GERD (gastroesophageal reflux disease) 10/13/2011   Body mass index (BMI) of 31.0-31.9 in adult 09/30/2011   Dyslipidemia 06/23/2011   Neuropathy 06/23/2011   Other fatigue 05/13/2011   Malaise and fatigue 05/13/2011   Rectal bleeding 04/18/2011   Atrial flutter 04/18/2011   Chronic anticoagulation 11/10/2010   PAF (paroxysmal atrial fibrillation) (HCC)    Tobacco abuse    COPD  GOLD 2/ AB 02/11/2010   Essential hypertension 01/28/2010   OSA (obstructive sleep apnea) 01/28/2010   Past Medical History:  Diagnosis Date   Actinic keratosis    Arthritis    lower back   Asthma  Atrial fibrillation    Atrial flutter    s/p CTI ablation by Dr Johney Frame   Brain aneurysm 2009   questionable. A follow up CTA in 2009 showed no evidence of   Chronic back pain    DDD/stenosis   Colon polyps    9 polyps removed 10/13/11   Complication of anesthesia 09/11/2012   slow to awaken after ablation   COPD (chronic obstructive pulmonary disease)    Diabetes mellitus    takes Metformin and Glimepiride daily   Emphysema    GERD (gastroesophageal reflux disease)    takes Omeprazole bid   Heart failure    History of shingles    HLD (hyperlipidemia)    takes Pravastatin daily   HTN (hypertension)    takes Prinizide daily   Obesity    OSA (obstructive sleep apnea)    not always using cpap   Overdose 2009   unintentional Flecanide overdose   Peripheral neuropathy    Short-term memory loss    Tobacco abuse     Family History  Problem Relation Age of Onset   Diabetes Mother    Heart disease Father    Lung cancer Father    Diabetes Maternal Grandmother    Colon cancer Neg Hx    Anesthesia problems Neg Hx    Hypotension Neg Hx    Malignant hyperthermia Neg Hx    Pseudochol deficiency Neg Hx    Esophageal cancer Neg Hx    Pancreatic cancer Neg Hx    Stomach cancer Neg Hx     Past Surgical History:  Procedure Laterality Date   ANTERIOR CERVICAL DECOMP/DISCECTOMY FUSION  01/04/2012    Procedure: ANTERIOR CERVICAL DECOMPRESSION/DISCECTOMY FUSION 1 LEVEL/HARDWARE REMOVAL;  Surgeon: Cristi Loron, MD;  Location: [facility];  Service: Neurosurgery;  Laterality: N/A;  explore cervical fusion Cervical six - seven  with removal of codman plate anterior cervical decompression with fusion interbody prothesis plating and bonegraft   APPENDECTOMY  2-60yrs ago   ATRIAL FIBRILLATION ABLATION N/A 09/11/2012   PT DID NOT HAVE AN ATRIAL FIBRILLATION ABLATION IN 2014!  ATRIAL FLUTTER ABLATION ONLY   ATRIAL FLUTTER ABLATION  09/11/2012   CTI ablation by Dr Johney Frame   BIOPSY  11/07/2019   Procedure: BIOPSY;  Surgeon: Rachael Fee, MD;  Location: [facility] ENDOSCOPY;  Service: Endoscopy;;   CARDIAC CATHETERIZATION  2008   no significant CAD   CARDIOVERSION  05/05/2011   Procedure: CARDIOVERSION;  Surgeon: Lewayne Bunting, MD;  Location: [facility] ENDOSCOPY;  Service: Cardiovascular;  Laterality: N/A;   CARDIOVERSION Bilateral 07/26/2012   Procedure: CARDIOVERSION;  Surgeon: Rollene Rotunda, MD;  Location: [facility] ENDOSCOPY;  Service: Cardiovascular;  Laterality: Bilateral;   CARPAL TUNNEL RELEASE  99/2000   bilateral   COLONOSCOPY WITH PROPOFOL N/A 12/13/2012   Procedure: COLONOSCOPY WITH PROPOFOL;  Surgeon: Rachael Fee, MD;  Location: [facility] ENDOSCOPY;  Service: Endoscopy;  Laterality: N/A;   ELECTROPHYSIOLOGIC STUDY N/A 04/21/2015   Procedure: Atrial Fibrillation Ablation;  Surgeon: Hillis Range, MD;  Location: [facility] INVASIVE CV LAB;  Service: Cardiovascular;  Laterality: N/A;   ESOPHAGOGASTRODUODENOSCOPY (EGD) WITH PROPOFOL N/A 11/07/2019   Procedure: ESOPHAGOGASTRODUODENOSCOPY (EGD) WITH PROPOFOL;  Surgeon: Rachael Fee, MD;  Location: [facility] ENDOSCOPY;  Service: Endoscopy;  Laterality: N/A;   HERNIA REPAIR     KNEE ARTHROSCOPY WITH MEDIAL MENISECTOMY Left 04/23/2021   Procedure: LEFT KNEE ARTHROSCOPY WITH PARTIAL MEDIAL MENISCECTOMY;  Surgeon: Eldred Manges, MD;  Location: [facility] OR;  Service: Orthopedics;  Laterality: Left;   KNEE SURGERY  6-7574yrs ago   left   LEFT HEART CATH AND CORONARY ANGIOGRAPHY N/A 09/19/2016   Procedure: Left Heart Cath and Coronary Angiography;  Surgeon: Lyn RecordsSmith, Henry W, MD;  [facility];  Service: Cardiovascular;  Laterality: N/A;   Left inguinal hernia repair     as a child   NASAL SEPTOPLASTY W/ TURBINOPLASTY Bilateral 02/19/2014   Procedure: NASAL SEPTOPLASTY WITH BILATERAL TURBINATE REDUCTION;  Surgeon: Flo ShanksKarol Wolicki, MD;  [facility];  Service: ENT;  Laterality: Bilateral;   NECK SURGERY  6652yrs ago   right shoulder surgery  4-5730yrs ago   cyst removed   TEE WITHOUT CARDIOVERSION  05/05/2011   Procedure: TRANSESOPHAGEAL ECHOCARDIOGRAM (TEE);  Surgeon: Lewayne BuntingBrian S Crenshaw, MD;  [facility];  Service: Cardiovascular;  Laterality: N/A;   TEE WITHOUT CARDIOVERSION N/A 04/21/2015   Procedure: TRANSESOPHAGEAL ECHOCARDIOGRAM (TEE);  Surgeon: Quintella Reichertraci R Turner, MD;  [facility];  Service: Cardiovascular;  Laterality: N/A;   THROAT SURGERY  4-7030yrs ago   "thought " it was cancer but came back not   TONSILLECTOMY     Social History   Occupational History   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

## 2022-08-25 ENCOUNTER — Telehealth: Payer: Self-pay | Admitting: Acute Care

## 2022-08-25 ENCOUNTER — Other Ambulatory Visit: Payer: Self-pay

## 2022-08-25 DIAGNOSIS — R911 Solitary pulmonary nodule: Secondary | ICD-10-CM

## 2022-08-25 DIAGNOSIS — F17210 Nicotine dependence, cigarettes, uncomplicated: Secondary | ICD-10-CM

## 2022-08-25 NOTE — Telephone Encounter (Signed)
Results/plan faxed to PCP and order placed for LDCT nodule follow up for Late April 2024

## 2022-08-25 NOTE — Telephone Encounter (Signed)
Thank you so much for keeping me updated.  Much appreciated.  [name]

## 2022-08-25 NOTE — Telephone Encounter (Signed)
I called and spoke with the patient. His LDCT was red as a LR 2 but there is a 15.1 mm nodule in the LUL that shows a nodular area of architectural distortion in the upper left lobe. I reviewed the scan with Dr. Tonia Brooms and plan is for a follow up scan 09/2022 to re-evaluate area of concern.  There were multiple incidental findings. Aortic atherosclerosis. Diffusely nodular contour of the liver indicative of underlying cirrhosis. 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 Spleen is incompletely imaged, but appears likely enlarged measuring at least 19.5 x 7.9 cm on axial images.  Pt. Is scheduled for CT bladder and abdomen per urology tomorrow as he has very dark brown urine. He also states he has been short of breath and has been wheezing. He does have a history of CHF and is on lasix TID. He states he has been compliant with his medication.  Dr. Sherene Sires, patient is not scheduled to see you until 09/28/2022. Do you think you can get him in sooner as he is symptomatic ?   Angelique Blonder, please place order  late this month for follow up low dose CT Chest. To re-evaluate the nodule of concern. Thanks  Fax results to PCP, and let her know plan for follow up.  Thanks so much

## 2022-08-25 NOTE — Progress Notes (Signed)
Added uloric for chronic gout.

## 2022-08-27 ENCOUNTER — Other Ambulatory Visit: Payer: Self-pay | Admitting: Cardiology

## 2022-08-27 DIAGNOSIS — I480 Paroxysmal atrial fibrillation: Secondary | ICD-10-CM

## 2022-08-28 ENCOUNTER — Other Ambulatory Visit: Payer: Self-pay

## 2022-08-28 MED ORDER — DEXCOM G7 SENSOR MISC: 3.0000 | 4 refills | Status: DC

## 2022-08-29 ENCOUNTER — Ambulatory Visit: Payer: Commercial Managed Care - HMO | Attending: Cardiology | Admitting: Pharmacist

## 2022-08-29 DIAGNOSIS — Z7901 Long term (current) use of anticoagulants: Secondary | ICD-10-CM | POA: Diagnosis not present

## 2022-08-29 DIAGNOSIS — I4892 Unspecified atrial flutter: Secondary | ICD-10-CM | POA: Diagnosis not present

## 2022-08-29 DIAGNOSIS — I480 Paroxysmal atrial fibrillation: Secondary | ICD-10-CM | POA: Diagnosis not present

## 2022-08-29 LAB — POCT INR: POC INR: 2.4

## 2022-08-29 NOTE — Patient Instructions (Signed)
Continue taking warfarin 1/2 tablet daily except for 1 tablet every Mondays and Fridays. Recheck INR in 4 weeks. Anticoagulation Clinic [phone]

## 2022-08-29 NOTE — Telephone Encounter (Addendum)
Warfarin 5mg  refill Afib/flutter Last today 08/29/22 Last OV 02/10/22

## 2022-08-30 ENCOUNTER — Ambulatory Visit: Payer: Commercial Managed Care - HMO | Admitting: Podiatry

## 2022-09-05 ENCOUNTER — Encounter: Payer: Self-pay | Admitting: Nurse Practitioner

## 2022-09-05 ENCOUNTER — Ambulatory Visit (INDEPENDENT_AMBULATORY_CARE_PROVIDER_SITE_OTHER): Payer: Commercial Managed Care - HMO | Admitting: Nurse Practitioner

## 2022-09-05 VITALS — BP 119/64 | HR 67 | Ht 74.0 in | Wt 249.1 lb

## 2022-09-05 DIAGNOSIS — I480 Paroxysmal atrial fibrillation: Secondary | ICD-10-CM

## 2022-09-05 DIAGNOSIS — Y92009 Unspecified place in unspecified non-institutional (private) residence as the place of occurrence of the external cause: Secondary | ICD-10-CM

## 2022-09-05 DIAGNOSIS — M21371 Foot drop, right foot: Secondary | ICD-10-CM

## 2022-09-05 DIAGNOSIS — W19XXXS Unspecified fall, sequela: Secondary | ICD-10-CM | POA: Diagnosis not present

## 2022-09-05 DIAGNOSIS — R2689 Other abnormalities of gait and mobility: Secondary | ICD-10-CM

## 2022-09-05 DIAGNOSIS — M1A39X1 Chronic gout due to renal impairment, multiple sites, with tophus (tophi): Secondary | ICD-10-CM | POA: Diagnosis not present

## 2022-09-05 DIAGNOSIS — J449 Chronic obstructive pulmonary disease, unspecified: Secondary | ICD-10-CM

## 2022-09-05 DIAGNOSIS — R079 Chest pain, unspecified: Secondary | ICD-10-CM

## 2022-09-05 DIAGNOSIS — M21372 Foot drop, left foot: Secondary | ICD-10-CM

## 2022-09-05 MED ORDER — NITROGLYCERIN 0.4 MG SL SUBL: 0.4000 mg | SUBLINGUAL_TABLET | SUBLINGUAL | 3 refills | Status: DC | PRN

## 2022-09-05 NOTE — Progress Notes (Unsigned)
Established patient visit   Patient: KASHTON MCARTOR   DOB: 05-Nov-1955   67 y.o. Male  MRN: [id number] Visit Date: 09/05/2022  Chief Complaint  Patient presents with   balance   Subjective    HPI  Fall in the home in March, 2024.  -right toe has a "drop" and toe got caught on something. Lunged forward and hit head very hard on cabinet.  -does not believe he lost consciousness.  -had some "sluggishness" after the fall.  -now, he believes he is having problems with balance.  -had been experiencing "foot drop" on the right side, and now, is experiencing similar symptoms on the left.  -if he turns his head too fast to the left or right, he gets very dizzy, and loses his balance.  -the patient is using a cane to help him with balance.  -the patient does see a neurologist.  -Dr. Loleta Chance, neurologist, has ordered MRI scans of the cervical, thoracic, and lumbar spine. These studies have been ordered months ago. Patient states that he has not heard from imaging center to schedule the studies.   Medications: Outpatient Medications Prior to Visit  Medication Sig   albuterol (PROAIR HFA) 108 (90 Base) MCG/ACT inhaler Inhale 1-2 puffs into the lungs every 6 (six) hours as needed for wheezing or shortness of breath.   albuterol (PROVENTIL) (2.5 MG/3ML) 0.083% nebulizer solution Take 3 mLs (2.5 mg total) by nebulization every 6 (six) hours as needed for wheezing or shortness of breath.   b complex vitamins tablet Take 1 tablet by mouth daily.    bisoprolol (ZEBETA) 10 MG tablet Take 1 tablet (10 mg total) by mouth daily.   colchicine 0.6 MG tablet TAKE 2 TABLETS BY MOUTH AT ONCE. THEN TAKE 1 TABLET 1 HOUR LATER. WAIT 2 DAYS, THEN BEGIN TAKING 1 TABLET DAILY TO PREVENT ACUTE GOUT.   Continuous Blood Gluc Sensor (DEXCOM G7 SENSOR) MISC 3 each by Does not apply route every 30 (thirty) days. Apply 1 sensor every 10 days   Continuous Blood Gluc Transmit (DEXCOM G6 TRANSMITTER) MISC USE AS DIRECTED    diltiazem (CARDIZEM CD) 120 MG 24 hr capsule Take 1 capsule (120 mg total) by mouth daily.   Dulaglutide (TRULICITY) 3 MG/0.5ML SOPN Inject 3 mg as directed once a week.   famotidine (PEPCID) 20 MG tablet One after supper   febuxostat (ULORIC) 40 MG tablet Take 1 tablet (40 mg total) by mouth daily.   gabapentin (NEURONTIN) 300 MG capsule TAKE 3 CAPSULES BY MOUTH THREE TIMES DAILY FOR NEUROPATHY   Glucagon 3 MG/DOSE POWD Place 3 mg into the nose once as needed for up to 1 dose.   Insulin Pen Needle (EASY COMFORT PEN NEEDLES) 31G X 5 MM MISC To use with as needed insulin dosing up to four times daily   insulin regular human CONCENTRATED (HUMULIN R U-500 KWIKPEN) 500 UNIT/ML KwikPen Inject 260 Units into the skin daily. Use up to 250-260 units of insulin daily as advised   ketoconazole (NIZORAL) 2 % cream Apply 1 Application topically daily.   losartan (COZAAR) 50 MG tablet TAKE 1 TABLET BY MOUTH DAILY   metolazone (ZAROXOLYN) 2.5 MG tablet TAKE 1 TABLET BY MOUTH ON TUESDAY AND THURSDAY 30 MINUTES BEFORE TAKING TORSEMIDE OR AS DIRECTED   mupirocin ointment (BACTROBAN) 2 % Ply small amount to effected area twice daily for 10 days.   oxyCODONE-acetaminophen (PERCOCET) 10-325 MG tablet Take 1.5 tablets by mouth in the morning, at noon, in the evening,  and at bedtime.   pantoprazole (PROTONIX) 40 MG tablet Take 1 tablet (40 mg total) by mouth daily. Take 30-60 min before first meal of the day   potassium chloride SA (KLOR-CON M) 20 MEQ tablet TAKE 1 TABLET BY MOUTH DAILY. TAKE AN EXTRA TABLET ON TUESDAY AND THURSDAY   predniSONE (DELTASONE) 10 MG tablet 2  with breakfast until better then 1 daily x 3 days and stop   tamsulosin (FLOMAX) 0.4 MG CAPS capsule TAKE 1 CAPSULE BY MOUTH EACH NIGHT AT BEDTIME   torsemide (DEMADEX) 20 MG tablet Take 2 tablets in the am and 1 tablet in the pm   triamcinolone cream (KENALOG) 0.1 % Apply 1 Application topically daily as needed.   umeclidinium-vilanterol (ANORO  ELLIPTA) 62.5-25 MCG/ACT AEPB Inhale 1 puff into the lungs daily.   VITAMIN A PO Take 2,400 mcg by mouth daily.    Vitamin D, Ergocalciferol, (DRISDOL) 1.25 MG (50000 UNIT) CAPS capsule TAKE 1 CAPSULE BY MOUTH EVERY 7 DAYS   warfarin (COUMADIN) 5 MG tablet Take 1/2 to 1 tablet daily or as directed   [DISCONTINUED] nitroGLYCERIN (NITROSTAT) 0.4 MG SL tablet Place 1 tablet (0.4 mg total) under the tongue every 5 (five) minutes x 3 doses as needed for chest pain.   [DISCONTINUED] fluticasone furoate-vilanterol (BREO ELLIPTA) 100-25 MCG/ACT AEPB Inhale 1 puff into the lungs every morning.   [DISCONTINUED] ketoconazole (NIZORAL) 2 % shampoo Apply 1 Application topically 2 (two) times a week. 2x/wk, let sit 5 minutes and rinse off, wash from scalp to waist, avoid eyes   [DISCONTINUED] sulfamethoxazole-trimethoprim (BACTRIM DS) 800-160 MG tablet Take 1 tablet by mouth 2 (two) times daily.   No facility-administered medications prior to visit.    Review of Systems See HPI     Objective     Today's Vitals   09/05/22 1358  BP: 119/64  Pulse: 67  SpO2: 96%  Weight: 249 lb 1.9 oz (113 kg)  Height: 6\' 2"  (1.88 m)   Body mass index is 31.99 kg/m.   Physical Exam Vitals and nursing note reviewed.  Constitutional:      Appearance: Normal appearance. He is well-developed. He is not ill-appearing.  HENT:     Head: Normocephalic and atraumatic.     Nose: No congestion.     Mouth/Throat:     Mouth: Mucous membranes are moist.     Pharynx: Oropharynx is clear.  Eyes:     Extraocular Movements: Extraocular movements intact.     Conjunctiva/sclera: Conjunctivae normal.     Pupils: Pupils are equal, round, and reactive to light.  Cardiovascular:     Rate and Rhythm: Normal rate. Rhythm irregular.     Pulses:          Dorsalis pedis pulses are 1+ on the right side and 1+ on the left side.       Posterior tibial pulses are 1+ on the left side.     Heart sounds: Normal heart sounds.  Pulmonary:      Effort: Pulmonary effort is normal.     Breath sounds: Wheezing and rhonchi present.  Abdominal:     Palpations: Abdomen is soft.  Musculoskeletal:     Left hand: Swelling, deformity, tenderness and bony tenderness present. Decreased range of motion.       Arms:     Cervical back: Normal range of motion and neck supple.     Right foot: Decreased range of motion. Foot drop present.       Feet:  Comments: Generalized joint pain with point tenderness present. Some joints with mild to moderate swelling.  Moderate weakness in both lower extremities.  -gait unstable. Currently using a cane to help with ambulation and balance. Patient unsteady.   Feet:     Right foot:     Protective Sensation: 10 sites tested.  6 sites sensed.     Skin integrity: Dry skin present.     Toenail Condition: Right toenails are abnormally thick and long.     Left foot:     Protective Sensation: 10 sites tested.  6 sites sensed.     Skin integrity: Dry skin present.     Toenail Condition: Left toenails are abnormally thick and long.  Lymphadenopathy:     Cervical: No cervical adenopathy.  Skin:    General: Skin is warm and dry.     Capillary Refill: Capillary refill takes less than 2 seconds.  Neurological:     General: No focal deficit present.     Mental Status: He is alert and oriented to person, place, and time.     Cranial Nerves: No cranial nerve deficit.     Motor: Weakness present.     Coordination: Coordination abnormal.     Gait: Gait abnormal.     Comments: Moderate coarse tremor of bilateral upper extremities.   Psychiatric:        Mood and Affect: Mood normal.        Behavior: Behavior normal.        Thought Content: Thought content normal.        Judgment: Judgment normal.      Assessment & Plan     Balance problem Assessment & Plan: Worsening balance problems since fall, hitting head at home. Will get CT of the head for evaluation of acute abnormality    Orders: -     CT  HEAD W & WO CONTRAST ( ); Future  Fall in home, sequela Assessment & Plan: Fall at home a few weeks ago, hitting his head.  -balance and coordination concerns since then -slower speech since fall.  -will get CT of the head for further evaluation.   Orders: -     CT HEAD W & WO CONTRAST ( ); Future  Foot drop, bilateral Assessment & Plan: Patient experiencing worse foot drop since fall at home.  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 Continue colchicine as previously prescribed    Chest pain, unspecified type Assessment & Plan: Renew prescription for nitroglycerin sent to pharmacy   Orders: -     Nitroglycerin; Place 1 tablet (0.4 mg total) under the tongue every 5 (five) minutes x 3 doses as needed for chest pain.  Dispense: 25 tablet; Refill: 3  PAF (paroxysmal atrial fibrillation) (HCC) Assessment & Plan: History of diastolic heart failure, A. fib, pulmonary hypertension.  Continue on current regimen and follow-up with cardiology   COPD  GOLD 2/ AB Assessment & Plan: Congested breath sounds  Chronic cough Use inhales and respiratory medications as prescribed  Regular visits with pulmonology as scheduled.       Return for as scheduled.        Carlean Jews, NP  [facility] [phone] (phone) [phone] (fax)  [facility]

## 2022-09-06 ENCOUNTER — Telehealth: Payer: Self-pay

## 2022-09-06 NOTE — Telephone Encounter (Signed)
Patient called office about Electric wheelchair, please advise, thanks.

## 2022-09-06 NOTE — Telephone Encounter (Signed)
Called pt he is advised that the form was handed to him when he was in office pt stated that he will have to look for that paperwork

## 2022-09-08 DIAGNOSIS — Y92009 Unspecified place in unspecified non-institutional (private) residence as the place of occurrence of the external cause: Secondary | ICD-10-CM | POA: Insufficient documentation

## 2022-09-08 DIAGNOSIS — M21371 Foot drop, right foot: Secondary | ICD-10-CM | POA: Insufficient documentation

## 2022-09-08 DIAGNOSIS — R2689 Other abnormalities of gait and mobility: Secondary | ICD-10-CM | POA: Insufficient documentation

## 2022-09-08 NOTE — Assessment & Plan Note (Signed)
Fall at home a few weeks ago, hitting his head.  -balance and coordination concerns since then -slower speech since fall.  -will get CT of the head for further evaluation.

## 2022-09-08 NOTE — Assessment & Plan Note (Signed)
Patient now started on uloric 40 mg daily. Continue colchicine as previously prescribed

## 2022-09-08 NOTE — Assessment & Plan Note (Signed)
Renew prescription for nitroglycerin sent to pharmacy

## 2022-09-08 NOTE — Assessment & Plan Note (Signed)
Worsening balance problems since fall, hitting head at home. Will get CT of the head for evaluation of acute abnormality

## 2022-09-08 NOTE — Assessment & Plan Note (Signed)
Patient experiencing worse foot drop since fall at home.  Will get CT scan of head for further evaluation

## 2022-09-08 NOTE — Assessment & Plan Note (Signed)
History of diastolic heart failure, A. fib, pulmonary hypertension.  Continue on current regimen and follow-up with cardiology 

## 2022-09-08 NOTE — Assessment & Plan Note (Signed)
Congested breath sounds  Chronic cough Use inhales and respiratory medications as prescribed  Regular visits with pulmonology as scheduled.

## 2022-09-10 ENCOUNTER — Other Ambulatory Visit: Payer: Self-pay | Admitting: Nurse Practitioner

## 2022-09-12 ENCOUNTER — Other Ambulatory Visit: Payer: Self-pay | Admitting: Nurse Practitioner

## 2022-09-12 ENCOUNTER — Telehealth: Payer: Self-pay | Admitting: *Deleted

## 2022-09-12 DIAGNOSIS — Y92009 Unspecified place in unspecified non-institutional (private) residence as the place of occurrence of the external cause: Secondary | ICD-10-CM

## 2022-09-12 DIAGNOSIS — M21371 Foot drop, right foot: Secondary | ICD-10-CM

## 2022-09-12 DIAGNOSIS — R2689 Other abnormalities of gait and mobility: Secondary | ICD-10-CM

## 2022-09-12 NOTE — Progress Notes (Signed)
Changed CT order to do without contrast per insurance request/recommendation.

## 2022-09-12 NOTE — Telephone Encounter (Signed)
Trying to get approval for the CT that was ordered and as of now it has been denied.  The following information was in the [place] site. Please advise.     Your healthcare provider told us that you have had head trauma. The request cannot be approved because:  Two sets of pictures (without a dye and then with a dye) are not needed to show your doctor the details needed to treat you. One picture study taken with a dye or without a dye is sufficient. A different study (70450-CT Scan of head/brain without dye ) is likely to show your doctor all of the details they need to see in order to treat you. This study will be approved if requested.   This finding was based on review of [employer] Head Imaging Guidelines Section(s): Head Trauma (HD 13.1) and Preface to the Imaging Guidelines, section Preface-3 Clinical Information.

## 2022-09-12 NOTE — Telephone Encounter (Signed)
I changed the order to CT without contrast due to insurance request/recommendation.

## 2022-09-12 NOTE — Telephone Encounter (Signed)
[name] called giving the same information below.  Stated that if provider wants to do a peer to peer then they can call [phone].  Case #[id number].  If the order they suggested is acceptable then I can call and have them change it in the request. She said to not start another request.  Please advise.

## 2022-09-13 NOTE — Telephone Encounter (Signed)
Contacted insurance and this was approved.  Contacted [employer] and gave them the authorization #

## 2022-09-15 NOTE — Telephone Encounter (Signed)
Left message for pt to call to schedule sooner appt if he is still having issues with breathing. This can be scheduled with a NP per Dr Sherene Sires.

## 2022-09-22 ENCOUNTER — Other Ambulatory Visit: Payer: Self-pay | Admitting: Nurse Practitioner

## 2022-09-22 ENCOUNTER — Ambulatory Visit
Admission: RE | Admit: 2022-09-22 | Discharge: 2022-09-22 | Disposition: A | Discharge status: Home / Self Care | Payer: Commercial Managed Care - HMO | Source: Ambulatory Visit | Attending: Nurse Practitioner | Admitting: Nurse Practitioner

## 2022-09-22 DIAGNOSIS — M10029 Idiopathic gout, unspecified elbow: Secondary | ICD-10-CM

## 2022-09-22 DIAGNOSIS — F17210 Nicotine dependence, cigarettes, uncomplicated: Secondary | ICD-10-CM

## 2022-09-22 DIAGNOSIS — R911 Solitary pulmonary nodule: Secondary | ICD-10-CM

## 2022-09-26 ENCOUNTER — Ambulatory Visit: Payer: Commercial Managed Care - HMO | Attending: Cardiology

## 2022-09-26 DIAGNOSIS — Z7901 Long term (current) use of anticoagulants: Secondary | ICD-10-CM

## 2022-09-26 DIAGNOSIS — I480 Paroxysmal atrial fibrillation: Secondary | ICD-10-CM | POA: Diagnosis not present

## 2022-09-26 LAB — POCT INR: INR: 2.8 (ref 2.0–3.0)

## 2022-09-26 NOTE — Patient Instructions (Signed)
Continue taking warfarin 1/2 tablet daily except for 1 tablet every Mondays and Fridays.  Recheck INR in 6 weeks. Anticoagulation Clinic [phone] 

## 2022-09-26 NOTE — Progress Notes (Unsigned)
Subjective:    Patient ID: [name], male    DOB: 10/18/1955    MRN: [id number]  HPI PCP - [name]  31 yowm, active smoker with atrial fibrillation for FU of COPD & sleep apnea.  He had atrial fibrillation treated with flecainide in the past. He had TEE/DCCV. He is maintaining sinus rhythm,is on anticoagulation. 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  He reports episodes of coughing which have made him 'pass out'. He reports sinus drainage for many years but has never seen ENT.  He does have severe sleep apnea but couldn't afford CPAP. Reviewed PSG from [name] - severe with AHI 31.h corrected by CPAP 5 cm (wt was 250 lbs)  He smokes 1 PPD, drinks alcohol -'on weekends' - but I suspect more.   11/22/11 pre-op clearance for neck sx >> given course of prednisone for asthmatic bronchitis  dulera 100/5  Can use albuterol as needed.  Was set up with cpap  4-5 cigs/d, smoked 1/2 pack today due to stress CXR 11/21/11 no infx/ effusion He is not compliant with dulera, He has no income now & is unable to afford expensive meds Med review shows lisinopril & propranolol -which may be affecting his breathing rec STOP taking zestoretic Take HCTZ 25 mg daily instead I will ask your heart doctor to change propranolol to another medication You have to STOP smoking Stay on dulera 2 puffs twice daily until surgery  Take albuterol 2 puffs for emergency Stay on CPAP every night & right after surgery   12/23/2011 ov/acute work in [place], still smoking, lost instructions and may still be on ace, very hoarse, inhalers not helping.  No unusual cough, purulent sputum or sinus/hb symptoms on present rx. Chest feels generally tighter x 2 weeks and difficulty lying down and night due to sob. Rec STOP taking zestoretic Take HCTZ 25 mg daily instead  change propranolol to Bystolic 10 mg one  dialy  You have to STOP smoking before smoking stops you Stay on dulera 2 puffs in am and 2 puffs in pm  Take albuterol 2 puffs for emergency Prednisone 10 mg take  4 each am x 2 days,   2 each am x 2 days,  1 each am x2days and stop  Stop fish oil for now   08/04/2022  Re-establish ov/[name] re: Mild/mod AS/ AB still smoking maint on Anoro once each hs Chief Complaint  Patient presents with   Consult    Patient switching from Dr. Vassie [name].  Does not want to go to [facility] location.  C/o chest congestion and dyspnea started yesterday (08/03/2022).  Patient has tremor in both hands - he is unable to use the ellipta inhalers.  Dyspnea:  limited more by pain than breathing  Cough: clear mucus,worse x 24 h PTA Sleeping: flat bed, right side down down, one pillow  SABA use: no neb in months/ inhaler several days prior to OV   02: none  Covid status:   vax x 3  Lung cancer screening :  q march   Rec Stop anoro and all smoking Plan A = Automatic = Always=    BREO 100 one each am  Plan B = Backup (to supplement plan A, not to replace it) Only use your albuterol inhaler as a rescue medication  Plan C = Crisis (instead of Plan B but only  if Plan B stops working) - only use your albuterol nebulizer if you first try Plan B  Plan D = Deltasone  If ABC aren't working well, take prednisone 10 mg x 2 each am with breakfast, then 1 daily x 3 days and stop   Please schedule a follow up office visit in 6 weeks, call sooner if needed with all medications /inhalers/ solutions in hand    09/28/2022  f/u ov/[name] re: AB/AS  maint on anoro prn due to cost  brought all meds x for rescue saba hfa and neb solution  Chief Complaint  Patient presents with   Follow-up   Dyspnea:  back and legs stop him before breathing  Cough: better  Sleeping: bed is flat/ R side down  SABA use:  has rescue inhaler once every 2 weeks with temp change/ no neb use 02: none  LCS  RADS 1 return one year 09/21/12     No obvious  day to day or daytime variability or assoc excess/ purulent sputum or mucus plugs or hemoptysis or cp or chest tightness, subjective wheeze or overt sinus or hb symptoms.   Sleeping  without nocturnal  or early am exacerbation  of respiratory  c/o's or need for noct saba. Also denies any obvious fluctuation of symptoms with weather or environmental changes or other aggravating or alleviating factors except as outlined above   No unusual exposure hx or h/o childhood pna/ asthma or knowledge of premature birth.  Current Allergies, Complete Past Medical History, Past Surgical History, Family History, and Social History were reviewed in [employer] record.  ROS  The following are not active complaints unless bolded Hoarseness, sore throat, dysphagia, dental problems, itching, sneezing,  nasal congestion or discharge of excess mucus or purulent secretions, ear ache,   fever, chills, sweats, unintended wt loss or wt gain, classically pleuritic or exertional cp,  orthopnea pnd or arm/hand swelling  or leg swelling, presyncope, palpitations, abdominal pain, anorexia, nausea, vomiting, diarrhea  or change in bowel habits or change in bladder habits, change in stools or change in urine, dysuria, hematuria,  rash, arthralgias, visual complaints, headache, numbness, weakness or ataxia or problems with walking or coordination,  change in mood or  memory.        Current Meds  Medication Sig   albuterol (PROAIR HFA) 108 (90 Base) MCG/ACT inhaler Inhale 1-2 puffs into the lungs every 6 (six) hours as needed for wheezing or shortness of breath.   albuterol (PROVENTIL) (2.5 MG/3ML) 0.083% nebulizer solution Take 3 mLs (2.5 mg total) by nebulization every 6 (six) hours as needed for wheezing or shortness of breath.   b complex vitamins tablet Take 1 tablet by mouth daily.    bisoprolol (ZEBETA) 10 MG tablet Take 1 tablet (10 mg total) by mouth daily.   colchicine 0.6 MG tablet TAKE 2 TABLETS BY  MOUTH AT ONCE. THEN TAKE 1 TABLET 1 HOUR LATER. WAIT 2 DAYS, THEN BEGIN TAKING 1 TABLET DAILY TO PREVENT ACUTE GOUT.   Continuous Blood Gluc Sensor (DEXCOM G7 SENSOR) MISC 3 each by Does not apply route every 30 (thirty) days. Apply 1 sensor every 10 days   Continuous Blood Gluc Transmit (DEXCOM G6 TRANSMITTER) MISC USE AS DIRECTED   diltiazem (CARDIZEM CD) 120 MG 24 hr capsule Take 1 capsule (120 mg total) by mouth daily.   Dulaglutide (TRULICITY) 3 MG/0.5ML SOPN Inject 3 mg as directed once a week.   famotidine (PEPCID) 20 MG tablet One after  supper   febuxostat (ULORIC) 40 MG tablet Take 1 tablet (40 mg total) by mouth daily.   gabapentin (NEURONTIN) 300 MG capsule TAKE 3 CAPSULES BY MOUTH THREE TIMES DAILY FOR NEUROPATHY   Glucagon 3 MG/DOSE POWD Place 3 mg into the nose once as needed for up to 1 dose.   Insulin Pen Needle (EASY COMFORT PEN NEEDLES) 31G X 5 MM MISC To use with as needed insulin dosing up to four times daily   insulin regular human CONCENTRATED (HUMULIN R U-500 KWIKPEN) 500 UNIT/ML KwikPen Inject 260 Units into the skin daily. Use up to 250-260 units of insulin daily as advised   ketoconazole (NIZORAL) 2 % cream Apply 1 Application topically daily.   losartan (COZAAR) 50 MG tablet TAKE 1 TABLET BY MOUTH DAILY   metolazone (ZAROXOLYN) 2.5 MG tablet TAKE 1 TABLET BY MOUTH ON TUESDAY AND THURSDAY 30 MINUTES BEFORE TAKING TORSEMIDE OR AS DIRECTED   mupirocin ointment (BACTROBAN) 2 % Ply small amount to effected area twice daily for 10 days.   nitroGLYCERIN (NITROSTAT) 0.4 MG SL tablet Place 1 tablet (0.4 mg total) under the tongue every 5 (five) minutes x 3 doses as needed for chest pain.   oxyCODONE-acetaminophen (PERCOCET) 10-325 MG tablet Take 1.5 tablets by mouth in the morning, at noon, in the evening, and at bedtime.   pantoprazole (PROTONIX) 40 MG tablet Take 1 tablet (40 mg total) by mouth daily. Take 30-60 min before first meal of the day   potassium chloride SA (KLOR-CON  M) 20 MEQ tablet TAKE 1 TABLET BY MOUTH DAILY. TAKE AN EXTRA TABLET ON TUESDAY AND THURSDAY   tamsulosin (FLOMAX) 0.4 MG CAPS capsule TAKE 1 CAPSULE BY MOUTH EACH NIGHT AT BEDTIME   torsemide (DEMADEX) 20 MG tablet Take 2 tablets in the am and 1 tablet in the pm   triamcinolone cream (KENALOG) 0.1 % Apply 1 Application topically daily as needed.   umeclidinium-vilanterol (ANORO ELLIPTA) 62.5-25 MCG/ACT AEPB Inhale 1 puff into the lungs daily.   VITAMIN A PO Take 2,400 mcg by mouth daily.    Vitamin D, Ergocalciferol, (DRISDOL) 1.25 MG (50000 UNIT) CAPS capsule TAKE 1 CAPSULE BY MOUTH EVERY 7 DAYS   warfarin (COUMADIN) 5 MG tablet Take 1/2 to 1 tablet daily or as directed               Objective:   Physical Exam  wts   09/28/2022       248  08/04/22 255 lb 3.2 oz (115.8 kg)  08/01/22 253 lb 1.9 oz (114.8 kg)  07/06/22 257 lb (116.6 kg)    Vital signs reviewed  09/28/2022  - Note at rest 02 sats  95% on RA   General appearance:    tremulous amb wm easily confused with details of care      HEENT : Oropharynx  clear      NECK :  without  apparent JVD/ palpable Nodes/TM    LUNGS: no acc muscle use,  Min barrel  contour chest wall with bilateral  slightly decreased bs s audible wheeze and  without cough on insp or exp maneuvers and min  Hyperresonant  to  percussion bilaterally    CV:  RRR  no s3 or murmur or increase in P2, and no edema   ABD:  soft and nontender    MS:  Nl gait/ ext warm without deformities Or obvious joint restrictions  calf tenderness, cyanosis or clubbing     SKIN: warm and dry  without lesions    NEURO:  alert, approp, nl sensorium with  no motor or cerebellar deficits apparent.         I personally reviewed images and agree with radiology impression as follows:   Chest LDSCT     07/19/22 1. Lung-RADS 2S, benign appearance or behavior. Continue annual screening with low-dose chest CT without contrast in 12 months. 2. The "S" modifier above refers to  potentially clinically significant non lung cancer related findings. Specifically, there is aortic atherosclerosis, in addition to left main and three-vessel coronary artery disease.  3. Mild diffuse bronchial wall thickening with very mild centrilobular and paraseptal emphysema; imaging findings suggestive of underlying COPD. There are calcifications of the aortic valve and mitral annulus         Assessment & Plan:

## 2022-09-27 ENCOUNTER — Ambulatory Visit: Payer: Commercial Managed Care - HMO | Admitting: Podiatry

## 2022-09-27 ENCOUNTER — Other Ambulatory Visit: Payer: Self-pay | Admitting: Acute Care

## 2022-09-27 DIAGNOSIS — M216X1 Other acquired deformities of right foot: Secondary | ICD-10-CM

## 2022-09-27 DIAGNOSIS — Z122 Encounter for screening for malignant neoplasm of respiratory organs: Secondary | ICD-10-CM

## 2022-09-27 DIAGNOSIS — Z87891 Personal history of nicotine dependence: Secondary | ICD-10-CM

## 2022-09-27 DIAGNOSIS — F17210 Nicotine dependence, cigarettes, uncomplicated: Secondary | ICD-10-CM

## 2022-09-27 DIAGNOSIS — E0843 Diabetes mellitus due to underlying condition with diabetic autonomic (poly)neuropathy: Secondary | ICD-10-CM | POA: Diagnosis not present

## 2022-09-27 NOTE — Progress Notes (Signed)
Chief Complaint  Patient presents with   Peripheral Neuropathy    Patient came in today for diabetic foot care, neuropathy, foot drop, numbness burning, tingling, A1c- 7.6 BG- 230    Subjective: Patient is a 67 y.o. male presenting to the office today for routine diabetic foot exam.  Patient also states that he developed some dropfoot deformity to the right lower extremity over the past 6 months.  He says that his orthopedic doctor discussed possible AFO but ultimately the advised against this.  He also continues to have burning and numbness sensation to the feet secondary to neuropathy.  Presenting for follow-up treatment and evaluation  Past Medical History:  Diagnosis Date   Actinic keratosis    Arthritis    lower back   Asthma    Atrial fibrillation [facility])    Atrial flutter (HCC)    s/p CTI ablation by Dr Johney Frame   Brain aneurysm 2009   questionable. A follow up CTA in 2009 showed no evidence of   Chronic back pain    DDD/stenosis   Colon polyps    9 polyps removed 10/13/11   Complication of anesthesia 09/11/2012   slow to awaken after ablation   COPD (chronic obstructive pulmonary disease) (HCC)    Diabetes mellitus    takes Metformin and Glimepiride daily   Emphysema    GERD (gastroesophageal reflux disease)    takes Omeprazole bid   Heart failure (HCC)    History of shingles    HLD (hyperlipidemia)    takes Pravastatin daily   HTN (hypertension)    takes Prinizide daily   Obesity    OSA (obstructive sleep apnea)    not always using cpap   Overdose 2009   unintentional Flecanide overdose   Peripheral neuropathy    Short-term memory loss    Tobacco abuse     Objective:  Physical Exam General: Alert and oriented x3 in no acute distress  Dermatology: No open wounds.  Nails are well-maintained.  Vascular: VAS [place] ABI W/WO TBI 12/23/2021 ABI Findings:  +---------+------------------+-----+---------+--------+  Right   Rt Pressure (mmHg)IndexWaveform Comment    +---------+------------------+-----+---------+--------+  Brachial 127                                       +---------+------------------+-----+---------+--------+  PTA     197               1.47 triphasic          +---------+------------------+-----+---------+--------+  DP      [place]                     triphasic          +---------+------------------+-----+---------+--------+  Great Toe90                0.67                    +---------+------------------+-----+---------+--------+   +---------+------------------+-----+---------+-------+  Left    Lt Pressure (mmHg)IndexWaveform Comment  +---------+------------------+-----+---------+-------+  Brachial 134                                      +---------+------------------+-----+---------+-------+  PTA     180               1.34 triphasic         +---------+------------------+-----+---------+-------+  DP      168               1.25 triphasic         +---------+------------------+-----+---------+-------+  Great Toe82                0.61                   +---------+------------------+-----+---------+-------+   +-------+-----------+-----------+------------+------------+  ABI/TBIToday's ABIToday's TBIPrevious ABIPrevious TBI  +-------+-----------+-----------+------------+------------+  Right [place]         0.67       [place]          1.08          +-------+-----------+-----------+------------+------------+  Left  [place]         0.61       1.26        0.77          +-------+-----------+-----------+------------+------------+  Arterial wall calcification precludes accurate ankle pressures and ABIs.    Summary:  Right: Resting right ankle-brachial index indicates noncompressible right  lower extremity arteries. The right toe-brachial index is abnormal.   Left: Resting left ankle-brachial index indicates noncompressible left  lower extremity arteries. The left toe-brachial index  is abnormal.   Neurological: Light touch and protective threshold diminished bilaterally.  Paresthesia with pins-and-needles and burning sensation noted  Musculoskeletal Exam: Pain on palpation at the keratotic lesion(s) noted. Range of motion within normal limits bilateral. Muscle strength 5/5 in all groups bilateral.  Assessment: 1.  T2DM with peripheral polyneuropathy 2.  Encounter for diabetic foot exam  -Patient evaluated -Comprehensive diabetic foot exam performed today -Continue gabapentin as prescribed -Advised against going barefoot.  Recommend good supportive shoes and sneakers -Return to clinic annually  Felecia Shelling, DPM [facility]  Dr. Felecia Shelling, DPM    [address]                                        [place], [place] [address]                Office [phone]  Fax [phone]

## 2022-09-28 ENCOUNTER — Ambulatory Visit: Payer: Commercial Managed Care - HMO | Admitting: Internal Medicine

## 2022-09-28 ENCOUNTER — Encounter: Payer: Self-pay | Admitting: Internal Medicine

## 2022-09-28 VITALS — BP 128/62 | HR 62 | Temp 98.1°F | Ht 73.0 in | Wt 248.0 lb

## 2022-09-28 DIAGNOSIS — F17210 Nicotine dependence, cigarettes, uncomplicated: Secondary | ICD-10-CM | POA: Diagnosis not present

## 2022-09-28 DIAGNOSIS — J449 Chronic obstructive pulmonary disease, unspecified: Secondary | ICD-10-CM

## 2022-09-28 MED ORDER — TRELEGY ELLIPTA 100-62.5-25 MCG/ACT IN AEPB: 1.0000 | INHALATION_SPRAY | Freq: Every morning | RESPIRATORY_TRACT | 0 refills | Status: DC

## 2022-09-28 MED ORDER — PREDNISONE 10 MG PO TABS
ORAL_TABLET | ORAL | Status: DC
Start: 2022-09-28 — End: 2023-04-17

## 2022-09-28 MED ORDER — TRELEGY ELLIPTA 100-62.5-25 MCG/ACT IN AEPB
INHALATION_SPRAY | RESPIRATORY_TRACT | Status: DC
Start: 2022-09-28 — End: 2022-09-28

## 2022-09-28 MED ORDER — TRELEGY ELLIPTA 100-62.5-25 MCG/ACT IN AEPB
INHALATION_SPRAY | RESPIRATORY_TRACT | 5 refills | Status: DC
Start: 2022-09-28 — End: 2023-07-17

## 2022-09-28 NOTE — Patient Instructions (Addendum)
Trelegy 100 one click each am take 2 good drags  The key is to stop smoking completely before smoking completely stops you!  Suggested e-cigs as an optional  "one way bridge"  Off all tobacco products   Please schedule a follow up visit in 3 months but call sooner if needed -  PFTs on return  - bring your drug formulary to pick the cheapest option

## 2022-09-29 NOTE — Assessment & Plan Note (Addendum)
Active smoker - Spirometry 09/26/15  FEV1 2.79 (65%)  Ratio 0.68  p 16% resp to saba and min concavity to f/v curve - 08/04/2022 changed trelegy to BREO 100   - 09/28/2022 changed to Trelegy 100    Group D (now reclassified as E) in terms of symptom/risk and laba/lama/ICS  therefore appropriate rx at this point >>>  trelelgy 100 for now plus approp saba   Advised:  formulary restrictions will be an ongoing challenge for the forseable future and I would be happy to pick an alternative if the pt will first  provide me a list of them -  pt  will need to return here for training for any new device that is required eg dpi vs hfa vs respimat.    In the meantime we can always provide samples so that the patient never runs out of any needed respiratory medications.   - The proper method of use, as well as anticipated side effects, of a metered-dose inhaler were discussed and demonstrated to the patient using teach back method. Improved effectiveness after extensive coaching during this visit to a level of approximately 90 % from a baseline of 50%  % for elipta use           Each maintenance medication was reviewed in detail including emphasizing most importantly the difference between maintenance and prns and under what circumstances the prns are to be triggered using an action plan format where appropriate.  Total time for H and P, chart review, counseling, reviewing elipta  device(s) and generating customized AVS unique to this office visit / same day charting = 

## 2022-09-29 NOTE — Assessment & Plan Note (Signed)
4-5 min discussion re active cigarette smoking in addition to office E&M  Ask about tobacco use:   ongoin  Advise quitting   I took an extended  opportunity with this patient to outline the consequences of continued cigarette use  in airway disorders based on all the data we have from the multiple national lung health studies (perfomed over decades at millions of dollars in cost)  indicating that smoking cessation, not choice of inhalers or physicians, is the most important aspect of care.   Assess willingness:  ? Really  committed at this point Assist in quit attempt:  Suggested e-cigs as an optional  "one way bridge"  Off all tobacco products  Arrange follow up:   Follow up per Primary Care planned

## 2022-09-30 ENCOUNTER — Ambulatory Visit: Payer: Commercial Managed Care - HMO | Admitting: Internal Medicine

## 2022-10-03 ENCOUNTER — Telehealth: Payer: Self-pay | Admitting: Internal Medicine

## 2022-10-03 NOTE — Telephone Encounter (Signed)
Patient would like to let our office know he will be scheduling appointment for August when schedule is ready. Patient phone number is [phone].

## 2022-10-04 NOTE — Telephone Encounter (Signed)
Recall in pt's chart. Pt will be called once schedule is open.

## 2022-10-05 ENCOUNTER — Other Ambulatory Visit: Payer: Self-pay | Admitting: Cardiology

## 2022-10-06 ENCOUNTER — Encounter: Payer: Self-pay | Admitting: Nurse Practitioner

## 2022-10-06 ENCOUNTER — Ambulatory Visit (INDEPENDENT_AMBULATORY_CARE_PROVIDER_SITE_OTHER): Payer: Commercial Managed Care - HMO | Admitting: Nurse Practitioner

## 2022-10-06 VITALS — BP 126/74 | HR 71 | Ht 73.0 in | Wt 251.4 lb

## 2022-10-06 DIAGNOSIS — R531 Weakness: Secondary | ICD-10-CM

## 2022-10-06 DIAGNOSIS — M21372 Foot drop, left foot: Secondary | ICD-10-CM

## 2022-10-06 DIAGNOSIS — R2689 Other abnormalities of gait and mobility: Secondary | ICD-10-CM | POA: Diagnosis not present

## 2022-10-06 DIAGNOSIS — M21371 Foot drop, right foot: Secondary | ICD-10-CM | POA: Diagnosis not present

## 2022-10-06 NOTE — Progress Notes (Signed)
Established patient visit   Patient: [name]   DOB: 10/01/1955   67 y.o. Male  MRN: [id number] Visit Date: 10/06/2022   Chief Complaint  Patient presents with   evaluation for wheelchair   Subjective    HPI  Mobility exam Need for motorized wheelchair which has already been ordered.  Frequent falls at home. Most recently had fall in the home 07/28/2022  -right toe has a "drop" and toe got caught on something. Lunged forward and hit head very hard on cabinet.  -had to have a neighbor come help him get up.  -now having foot drop on left side  -if he turns his head too fast to the left or right, he gets very dizzy, and loses his balance.  -the patient is using a cane to help him with balance. -development of coarse tremor inh upper extremities  -States legs are so weak he is unable to stand up to cook. -Has already had handicap chair installed in the shower because he cannot stand long enough.  His legs become weak and Babs becomes a problem. -Sometimes needs help to put clothes on especially pants and socks. -Likely will be upper body strength to propel a manual wheelchair. -Patient has COPD.  Has increased shortness of breath especially with exertion. -Chronic A-fib contributing to shortness of breath.  Does see cardiology.    Medications: Outpatient Medications Prior to Visit  Medication Sig   albuterol (PROAIR HFA) 108 (90 Base) MCG/ACT inhaler Inhale 1-2 puffs into the lungs every 6 (six) hours as needed for wheezing or shortness of breath.   albuterol (PROVENTIL) (2.5 MG/3ML) 0.083% nebulizer solution Take 3 mLs (2.5 mg total) by nebulization every 6 (six) hours as needed for wheezing or shortness of breath.   b complex vitamins tablet Take 1 tablet by mouth daily.    bisoprolol (ZEBETA) 10 MG tablet Take 1 tablet (10 mg total) by mouth daily.   colchicine 0.6 MG tablet TAKE 2 TABLETS BY MOUTH AT ONCE. THEN TAKE 1 TABLET 1 HOUR LATER. WAIT 2 DAYS, THEN BEGIN TAKING 1  TABLET DAILY TO PREVENT ACUTE GOUT.   Continuous Blood Gluc Transmit (DEXCOM G6 TRANSMITTER) MISC USE AS DIRECTED   diltiazem (CARDIZEM CD) 120 MG 24 hr capsule Take 1 capsule (120 mg total) by mouth daily.   Dulaglutide (TRULICITY) 3 MG/0.5ML SOPN Inject 3 mg as directed once a week.   famotidine (PEPCID) 20 MG tablet One after supper   febuxostat (ULORIC) 40 MG tablet Take 1 tablet (40 mg total) by mouth daily.   Fluticasone-Umeclidin-Vilant (TRELEGY ELLIPTA) 100-62.5-25 MCG/ACT AEPB Inhale 1 puff into the lungs every morning.   Fluticasone-Umeclidin-Vilant (TRELEGY ELLIPTA) 100-62.5-25 MCG/ACT AEPB One click each am   gabapentin (NEURONTIN) 300 MG capsule TAKE 3 CAPSULES BY MOUTH THREE TIMES DAILY FOR NEUROPATHY   Glucagon 3 MG/DOSE POWD Place 3 mg into the nose once as needed for up to 1 dose.   Insulin Pen Needle (EASY COMFORT PEN NEEDLES) 31G X 5 MM MISC To use with as needed insulin dosing up to four times daily   insulin regular human CONCENTRATED (HUMULIN R U-500 KWIKPEN) 500 UNIT/ML KwikPen Inject 260 Units into the skin daily. Use up to 250-260 units of insulin daily as advised   ketoconazole (NIZORAL) 2 % cream Apply 1 Application topically daily.   losartan (COZAAR) 50 MG tablet TAKE 1 TABLET BY MOUTH DAILY   metolazone (ZAROXOLYN) 2.5 MG tablet TAKE 1 TABLET BY MOUTH ON TUESDAY AND THURSDAY  30 MINUTES BEFORE TAKING TORSEMIDE OR AS DIRECTED   mupirocin ointment (BACTROBAN) 2 % Ply small amount to effected area twice daily for 10 days.   nitroGLYCERIN (NITROSTAT) 0.4 MG SL tablet Place 1 tablet (0.4 mg total) under the tongue every 5 (five) minutes x 3 doses as needed for chest pain.   oxyCODONE-acetaminophen (PERCOCET) 10-325 MG tablet Take 1.5 tablets by mouth in the morning, at noon, in the evening, and at bedtime.   pantoprazole (PROTONIX) 40 MG tablet Take 1 tablet (40 mg total) by mouth daily. Take 30-60 min before first meal of the day   potassium chloride SA (KLOR-CON M) 20 MEQ  tablet TAKE 1 TABLET BY MOUTH DAILY. TAKE AN EXTRA TABLET ON TUESDAY AND THURSDAY   predniSONE (DELTASONE) 10 MG tablet 2 daily until breathing/cough better then 1 x 3 days and stop   tamsulosin (FLOMAX) 0.4 MG CAPS capsule TAKE 1 CAPSULE BY MOUTH EACH NIGHT AT BEDTIME   torsemide (DEMADEX) 20 MG tablet Take 2 tablets in the am and 1 tablet in the pm   triamcinolone cream (KENALOG) 0.1 % Apply 1 Application topically daily as needed.   VITAMIN A PO Take 2,400 mcg by mouth daily.    Vitamin D, Ergocalciferol, (DRISDOL) 1.25 MG (50000 UNIT) CAPS capsule TAKE 1 CAPSULE BY MOUTH EVERY 7 DAYS   warfarin (COUMADIN) 5 MG tablet Take 1/2 to 1 tablet daily or as directed   Continuous Blood Gluc Sensor (DEXCOM G7 SENSOR) MISC 3 each by Does not apply route every 30 (thirty) days. Apply 1 sensor every 10 days   No facility-administered medications prior to visit.    Review of Systems See HPI      Objective     Today's Vitals   10/06/22 1538  BP: 126/74  Pulse: 71  SpO2: 92%  Weight: 251 lb 6.4 oz (114 kg)  Height: 6\' 1"  (1.854 m)   Body mass index is 33.17 kg/m.  BP Readings from Last 3 Encounters:  10/24/22 125/64  10/06/22 126/74  09/28/22 128/62    Wt Readings from Last 3 Encounters:  10/24/22 254 lb (115.2 kg)  10/06/22 251 lb 6.4 oz (114 kg)  09/28/22 248 lb (112.5 kg)    Physical Exam Vitals and nursing note reviewed.  Constitutional:      Appearance: Normal appearance. He is well-developed.  HENT:     Head: Normocephalic and atraumatic.     Nose: Nose normal.     Mouth/Throat:     Mouth: Mucous membranes are moist.     Pharynx: Oropharynx is clear.  Eyes:     Extraocular Movements: Extraocular movements intact.     Conjunctiva/sclera: Conjunctivae normal.     Pupils: Pupils are equal, round, and reactive to light.  Neck:     Vascular: No carotid bruit.  Cardiovascular:     Rate and Rhythm: Normal rate. Rhythm irregular.     Pulses: Normal pulses.     Heart  sounds: Normal heart sounds.  Pulmonary:     Effort: Pulmonary effort is normal.     Breath sounds: Wheezing and rhonchi present.  Abdominal:     Palpations: Abdomen is soft.  Musculoskeletal:     Cervical back: Neck supple. Pain with movement present. Decreased range of motion.     Comments: Moderate, generalized weakness. Patient able to walk approximately 75 feet without stopping. -Pace is slow.  Unable to straighten his back. -Pain and weakness preventing him from going further without rest.  Lymphadenopathy:  Cervical: No cervical adenopathy.  Skin:    General: Skin is warm and dry.     Capillary Refill: Capillary refill takes less than 2 seconds.  Neurological:     General: No focal deficit present.     Mental Status: He is alert and oriented to person, place, and time.     Comments: But strength is reduced bilaterally. Foot drop present bilaterally. Tremor present in bilateral upper arms.  Psychiatric:        Mood and Affect: Mood normal.        Behavior: Behavior normal.        Thought Content: Thought content normal.        Judgment: Judgment normal.       Assessment & Plan    Decreased functional mobility Assessment & Plan: Today's visit has been for face-to-face evaluation for need for electric wheelchair.  Patient does suffer from balance and mobility problems.  Has generalized weakness.  This is a problem inside and outside of the home.  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  Has tremor in bilateral upper extremities.  Grip strengths are weak.   He has COPD and chronic A-fib, both causing shortness of breath with exertion. Having an electric wheelchair but is able the patient to move freely around his home without assistance without fear of falling.  Will allow him to cook and participate in other activities of daily living such as dressing, bathing, and taking care of household chores.  Orders: -     For home use only DME Wheelchair  electric  Foot drop, bilateral -     For home use only DME Wheelchair electric  Balance problem -     For home use only DME Wheelchair electric  Weakness -     For home use only DME Wheelchair electric     Return for as scheduled.         Carlean Jews, NP  [facility] at [facility] [phone] (phone) [phone] (fax)  [facility]

## 2022-10-11 ENCOUNTER — Other Ambulatory Visit: Payer: Commercial Managed Care - HMO

## 2022-10-18 ENCOUNTER — Other Ambulatory Visit: Payer: Commercial Managed Care - HMO

## 2022-10-20 ENCOUNTER — Telehealth: Payer: Self-pay | Admitting: *Deleted

## 2022-10-20 NOTE — Telephone Encounter (Signed)
Pt calling asking about the status of his wheelchair after coming in for his face to face appointment.  I have sent a message to the team that handles this to get the status. Will update pt once I find out something.

## 2022-10-21 ENCOUNTER — Other Ambulatory Visit: Payer: Self-pay | Admitting: Nurse Practitioner

## 2022-10-21 NOTE — Telephone Encounter (Signed)
Contacted [name] and she is going to check into this request and will get back with [place] if there is anything else we need to do.

## 2022-10-24 ENCOUNTER — Ambulatory Visit (INDEPENDENT_AMBULATORY_CARE_PROVIDER_SITE_OTHER): Payer: Commercial Managed Care - HMO | Admitting: Nurse Practitioner

## 2022-10-24 ENCOUNTER — Encounter: Payer: Self-pay | Admitting: Nurse Practitioner

## 2022-10-24 ENCOUNTER — Other Ambulatory Visit: Payer: Self-pay | Admitting: Nurse Practitioner

## 2022-10-24 VITALS — BP 125/64 | HR 73 | Resp 18 | Ht 73.0 in | Wt 254.0 lb

## 2022-10-24 DIAGNOSIS — L2089 Other atopic dermatitis: Secondary | ICD-10-CM

## 2022-10-24 DIAGNOSIS — L03119 Cellulitis of unspecified part of limb: Secondary | ICD-10-CM

## 2022-10-24 MED ORDER — CLOBETASOL PROPIONATE 0.05 % EX CREA
1.0000 | TOPICAL_CREAM | Freq: Two times a day (BID) | CUTANEOUS | 2 refills | Status: DC
Start: 2022-10-24 — End: 2023-08-21

## 2022-10-24 NOTE — Progress Notes (Signed)
Established patient visit   Patient: [name]   DOB: 01-May-1956   67 y.o. Male  MRN: [id number] Visit Date: 10/24/2022  Chief Complaint  Patient presents with   Leg Pain   Subjective    The patient states that both of his lower legs started hurting last week, on Wednesday  -started noticing redness and swelling yesterday or the day before. Skin is very flaky Saw his pulmonologist over the weekend -started on doxycycline twice daily and started prednisone taper as well.  --both medications were started yesterday    Leg Pain  The incident occurred 2 days ago. There was no injury mechanism. The pain is present in the left leg, left heel, left ankle, left foot, right ankle, right foot and right leg. He reports no foreign bodies present. The symptoms are aggravated by movement, palpation and weight bearing. He has tried immobilization, non-weight bearing and rest for the symptoms. The treatment provided no relief.      Medications: Outpatient Medications Prior to Visit  Medication Sig   albuterol (PROVENTIL) (2.5 MG/3ML) 0.083% nebulizer solution Take 3 mLs (2.5 mg total) by nebulization every 6 (six) hours as needed for wheezing or shortness of breath.   b complex vitamins tablet Take 1 tablet by mouth daily.    bisoprolol (ZEBETA) 10 MG tablet Take 1 tablet (10 mg total) by mouth daily.   colchicine 0.6 MG tablet TAKE 2 TABLETS BY MOUTH AT ONCE. THEN TAKE 1 TABLET 1 HOUR LATER. WAIT 2 DAYS, THEN BEGIN TAKING 1 TABLET DAILY TO PREVENT ACUTE GOUT.   Continuous Blood Gluc Sensor (DEXCOM G7 SENSOR) MISC 3 each by Does not apply route every 30 (thirty) days. Apply 1 sensor every 10 days   Continuous Blood Gluc Transmit (DEXCOM G6 TRANSMITTER) MISC USE AS DIRECTED   Continuous Glucose Receiver (DEXCOM G7 RECEIVER) DEVI See admin instructions.   diltiazem (CARDIZEM CD) 120 MG 24 hr capsule Take 1 capsule (120 mg total) by mouth daily.   Dulaglutide (TRULICITY) 3 MG/0.5ML SOPN Inject 3 mg  as directed once a week.   famotidine (PEPCID) 20 MG tablet One after supper   febuxostat (ULORIC) 40 MG tablet Take 1 tablet (40 mg total) by mouth daily.   Fluticasone-Umeclidin-Vilant (TRELEGY ELLIPTA) 100-62.5-25 MCG/ACT AEPB Inhale 1 puff into the lungs every morning.   Fluticasone-Umeclidin-Vilant (TRELEGY ELLIPTA) 100-62.5-25 MCG/ACT AEPB One click each am   gabapentin (NEURONTIN) 300 MG capsule TAKE 3 CAPSULES BY MOUTH THREE TIMES DAILY FOR NEUROPATHY   Glucagon 3 MG/DOSE POWD Place 3 mg into the nose once as needed for up to 1 dose.   Insulin Pen Needle (EASY COMFORT PEN NEEDLES) 31G X 5 MM MISC To use with as needed insulin dosing up to four times daily   insulin regular human CONCENTRATED (HUMULIN R U-500 KWIKPEN) 500 UNIT/ML KwikPen Inject 260 Units into the skin daily. Use up to 250-260 units of insulin daily as advised   ketoconazole (NIZORAL) 2 % cream Apply 1 Application topically daily.   losartan (COZAAR) 50 MG tablet TAKE 1 TABLET BY MOUTH DAILY   metolazone (ZAROXOLYN) 2.5 MG tablet TAKE 1 TABLET BY MOUTH ON TUESDAY AND THURSDAY 30 MINUTES BEFORE TAKING TORSEMIDE OR AS DIRECTED   mupirocin ointment (BACTROBAN) 2 % Ply small amount to effected area twice daily for 10 days.   nitroGLYCERIN (NITROSTAT) 0.4 MG SL tablet Place 1 tablet (0.4 mg total) under the tongue every 5 (five) minutes x 3 doses as needed for chest pain.  oxyCODONE-acetaminophen (PERCOCET) 10-325 MG tablet Take 1.5 tablets by mouth in the morning, at noon, in the evening, and at bedtime.   pantoprazole (PROTONIX) 40 MG tablet Take 1 tablet (40 mg total) by mouth daily. Take 30-60 min before first meal of the day   potassium chloride SA (KLOR-CON M) 20 MEQ tablet TAKE 1 TABLET BY MOUTH DAILY. TAKE AN EXTRA TABLET ON TUESDAY AND THURSDAY   predniSONE (DELTASONE) 10 MG tablet 2 daily until breathing/cough better then 1 x 3 days and stop   tamsulosin (FLOMAX) 0.4 MG CAPS capsule TAKE 1 CAPSULE BY MOUTH EACH NIGHT AT  BEDTIME   torsemide (DEMADEX) 20 MG tablet Take 2 tablets in the am and 1 tablet in the pm   triamcinolone cream (KENALOG) 0.1 % Apply 1 Application topically daily as needed.   VITAMIN A PO Take 2,400 mcg by mouth daily.    warfarin (COUMADIN) 5 MG tablet Take 1/2 to 1 tablet daily or as directed   [DISCONTINUED] albuterol (PROAIR HFA) 108 (90 Base) MCG/ACT inhaler Inhale 1-2 puffs into the lungs every 6 (six) hours as needed for wheezing or shortness of breath.   [DISCONTINUED] Vitamin D, Ergocalciferol, (DRISDOL) 1.25 MG (50000 UNIT) CAPS capsule TAKE 1 CAPSULE BY MOUTH EVERY 7 DAYS   No facility-administered medications prior to visit.    Review of Systems See HPI      Objective     Today's Vitals   10/24/22 1443  BP: 125/64  Pulse: 73  Resp: 18  SpO2: 96%  Weight: 254 lb (115.2 kg)  Height: 6\' 1"  (1.854 m)  PainSc: 6   PainLoc: Leg   Body mass index is 33.51 kg/m.   Physical Exam Vitals and nursing note reviewed.  Constitutional:      Appearance: Normal appearance. He is well-developed.  HENT:     Head: Normocephalic and atraumatic.     Nose: Nose normal.     Mouth/Throat:     Mouth: Mucous membranes are moist.     Pharynx: Oropharynx is clear.  Eyes:     Extraocular Movements: Extraocular movements intact.     Conjunctiva/sclera: Conjunctivae normal.     Pupils: Pupils are equal, round, and reactive to light.  Neck:     Vascular: No carotid bruit.  Cardiovascular:     Rate and Rhythm: Normal rate. Rhythm irregular.     Pulses: Normal pulses.     Heart sounds: Normal heart sounds.  Pulmonary:     Effort: Pulmonary effort is normal.     Breath sounds: Wheezing and rhonchi present.  Abdominal:     Palpations: Abdomen is soft.  Musculoskeletal:        General: Normal range of motion.     Cervical back: Normal range of motion and neck supple.     Right lower leg: Edema present.     Left lower leg: Edema present.  Lymphadenopathy:     Cervical: No  cervical adenopathy.  Skin:    General: Skin is warm and dry.     Capillary Refill: Capillary refill takes less than 2 seconds.     Findings: Erythema and rash present.     Comments: Redness, warmth, and tenderness of bilateral lower legs.  -flaking and rough tedtture to the lower extremities   Neurological:     General: No focal deficit present.     Mental Status: He is alert and oriented to person, place, and time. Mental status is at baseline.  Psychiatric:  Mood and Affect: Mood normal.        Behavior: Behavior normal.        Thought Content: Thought content normal.        Judgment: Judgment normal.      Assessment & Plan     Cellulitis of lower extremity, unspecified laterality Assessment & Plan: Cellulitis of bilateral lower extremities. -Patient already started on doxycycline 100 mg twice daily per pulmonology. -Prednisone taper was added for pulmonary as well. -Take both doxycycline and prednisone until completely finished. -Reassess as indicated.   Other atopic dermatitis Assessment & Plan: Trial of clobetasol cream. -Apply twice daily to all affected areas. -Consider referral to dermatology if indicated.  Orders: -     Clobetasol Propionate; Apply 1 Application topically 2 (two) times daily.  Dispense: 60 g; Refill: 2     Return for prn worsening or persistent symptoms.        Carlean Jews, NP  [facility] at [facility] [phone] (phone) [phone] (fax)  [facility]

## 2022-10-26 ENCOUNTER — Ambulatory Visit
Admission: RE | Admit: 2022-10-26 | Discharge: 2022-10-26 | Disposition: A | Discharge status: Home / Self Care | Payer: Commercial Managed Care - HMO | Source: Ambulatory Visit | Attending: Nurse Practitioner | Admitting: Nurse Practitioner

## 2022-10-26 DIAGNOSIS — R2689 Other abnormalities of gait and mobility: Secondary | ICD-10-CM

## 2022-10-26 DIAGNOSIS — Y92009 Unspecified place in unspecified non-institutional (private) residence as the place of occurrence of the external cause: Secondary | ICD-10-CM

## 2022-10-26 DIAGNOSIS — M21371 Foot drop, right foot: Secondary | ICD-10-CM

## 2022-10-28 ENCOUNTER — Telehealth: Payer: Self-pay

## 2022-10-28 ENCOUNTER — Telehealth: Payer: Self-pay | Admitting: Cardiology

## 2022-10-28 DIAGNOSIS — E785 Hyperlipidemia, unspecified: Secondary | ICD-10-CM

## 2022-10-28 NOTE — Telephone Encounter (Signed)
Follow Up: ? ? ? ? ?Patient is calling back. ?

## 2022-10-28 NOTE — Telephone Encounter (Signed)
Spoke with patient and he stated he had labs done in March and his cholesterol levels are really high. He was told by his pulmonologist informed him to stop taking his pravastatin for a couple of months but now he can't seem to get back on it. He would like for you to review labs in March and see what you think needs to be done about his cholesterol.

## 2022-10-28 NOTE — Telephone Encounter (Signed)
LVM to call back regarding medication.

## 2022-10-28 NOTE — Telephone Encounter (Signed)
Pt is requesting a Rx for high Cholesterol and also requesting a Rx for Vit D.

## 2022-10-28 NOTE — Telephone Encounter (Signed)
Pt c/o medication issue:  1. Name of Medication: Pravastatin  2. How are you currently taking this medication (dosage and times per day)?   3. Are you having a reaction (difficulty breathing--STAT)?   4. What is your medication issue? Patientt says he wants to know if he can start back taking it? He said he was told to stop taking it about a year ago. He want to start back taking it, he says his cholesterol is high. i

## 2022-10-30 NOTE — Assessment & Plan Note (Signed)
Today's visit has been for face-to-face evaluation for need for electric wheelchair.  Patient does suffer from balance and mobility problems.  Has generalized weakness.  This is a problem inside and outside of the home.  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  Has tremor in bilateral upper extremities.  Grip strengths are weak.   He has COPD and chronic A-fib, both causing shortness of breath with exertion. Having an electric wheelchair but is able the patient to move freely around his home without assistance without fear of falling.  Will allow him to cook and participate in other activities of daily living such as dressing, bathing, and taking care of household chores.

## 2022-10-31 NOTE — Telephone Encounter (Signed)
Faxed order and notes to the mobility team that sets up getting the wheelchair. They should contact pt for next steps.

## 2022-11-01 ENCOUNTER — Other Ambulatory Visit: Payer: Self-pay

## 2022-11-01 ENCOUNTER — Other Ambulatory Visit: Payer: Self-pay | Admitting: Nurse Practitioner

## 2022-11-01 ENCOUNTER — Other Ambulatory Visit: Payer: Self-pay | Admitting: Adult Health

## 2022-11-01 DIAGNOSIS — I2510 Atherosclerotic heart disease of native coronary artery without angina pectoris: Secondary | ICD-10-CM

## 2022-11-01 MED ORDER — VITAMIN D (ERGOCALCIFEROL) 1.25 MG (50000 UNIT) PO CAPS: 50000.0000 [IU] | ORAL_CAPSULE | ORAL | 3 refills | Status: AC

## 2022-11-01 MED ORDER — ROSUVASTATIN CALCIUM 10 MG PO TABS
10.0000 mg | ORAL_TABLET | Freq: Every day | ORAL | 1 refills | Status: DC
Start: 2022-11-01 — End: 2023-08-20

## 2022-11-01 NOTE — Telephone Encounter (Signed)
Called pt LVM to contact the office °

## 2022-11-01 NOTE — Telephone Encounter (Signed)
Please let the patient know that I sent crestor 10 mg daily to [facility]. This is for 90 days with a refill. Thanks so much.   -HB

## 2022-11-02 NOTE — Telephone Encounter (Signed)
Called pt LVM to contact the office °

## 2022-11-02 NOTE — Telephone Encounter (Signed)
Spoke with pt, he reports the medical doctor just started him on rosuvastatin 10 mg once daily. The pravastatin was stopped because of pain in his legs but the pain did not dissolve after stopping the medication. His medical doctor is leaving so there are no plans for repeat blood work to follow up on rosuvastatin. Patient aware will forward to dr hochrein

## 2022-11-02 NOTE — Telephone Encounter (Signed)
Contacted the mobility team and they state that the information has been received and that it has been moved to the coding and pricing process and that they usually follow up with patients 7-14 days.  Tried to contact pt to  notify him of this. Could not LVM due to VM not been activated. [name], CMA

## 2022-11-03 ENCOUNTER — Telehealth: Payer: Self-pay | Admitting: *Deleted

## 2022-11-03 ENCOUNTER — Other Ambulatory Visit: Payer: Self-pay | Admitting: Nurse Practitioner

## 2022-11-03 DIAGNOSIS — L2089 Other atopic dermatitis: Secondary | ICD-10-CM

## 2022-11-03 DIAGNOSIS — L02419 Cutaneous abscess of limb, unspecified: Secondary | ICD-10-CM

## 2022-11-03 MED ORDER — SULFAMETHOXAZOLE-TRIMETHOPRIM 800-160 MG PO TABS
1.0000 | ORAL_TABLET | Freq: Two times a day (BID) | ORAL | 0 refills | Status: DC
Start: 2022-11-03 — End: 2023-05-19

## 2022-11-03 MED ORDER — TACROLIMUS 0.1 % EX OINT
TOPICAL_OINTMENT | Freq: Two times a day (BID) | CUTANEOUS | 2 refills | Status: DC
Start: 2022-11-03 — End: 2023-08-21

## 2022-11-03 NOTE — Telephone Encounter (Signed)
Pt informed of below.  

## 2022-11-03 NOTE — Telephone Encounter (Signed)
Please let him know I would like to try a different antibiotic. I have added Bactrim DS twice daily for next 14 days. I have also changed the topical ointment. I changed this to tacrolimus which should be applied to skin twice daily. Does he have dermatologist?

## 2022-11-03 NOTE — Telephone Encounter (Signed)
Called pt he is advised of his Rx that was sent  

## 2022-11-03 NOTE — Telephone Encounter (Signed)
Pt calling to state he was seen for his legs about 10 days ago and feels like they are not really getting any better.  He would like to know what PCP recommends.  Please advise.

## 2022-11-03 NOTE — Telephone Encounter (Signed)
Called pt he is advise of his Rx that was sent to Pharmacy

## 2022-11-03 NOTE — Progress Notes (Signed)
Can you also let him know that the CT of his head was normal? Thanks  -HB

## 2022-11-04 ENCOUNTER — Telehealth: Payer: Self-pay

## 2022-11-04 NOTE — Telephone Encounter (Addendum)
Pt will need a referral to neurology.

## 2022-11-04 NOTE — Telephone Encounter (Signed)
Pt called for the results of CT.   I advised him that it was normal according to your result note.   Pt wants to know what he can do about him getting dizzy?

## 2022-11-04 NOTE — Telephone Encounter (Signed)
Spoke with pt wife, aware that patient will need lab work in 3 months. Lab orders mailed to the pt

## 2022-11-04 NOTE — Telephone Encounter (Signed)
I can send him to see a neurologist for further evaluation if he wants.

## 2022-11-07 ENCOUNTER — Ambulatory Visit: Payer: Commercial Managed Care - HMO | Attending: Cardiology | Admitting: *Deleted

## 2022-11-07 ENCOUNTER — Encounter: Payer: Self-pay | Admitting: Family Medicine

## 2022-11-07 ENCOUNTER — Ambulatory Visit: Payer: Commercial Managed Care - HMO | Admitting: Family Medicine

## 2022-11-07 ENCOUNTER — Ambulatory Visit (INDEPENDENT_AMBULATORY_CARE_PROVIDER_SITE_OTHER): Payer: Commercial Managed Care - HMO | Admitting: Family Medicine

## 2022-11-07 VITALS — BP 124/69 | HR 70 | Temp 97.5°F | Ht 74.0 in | Wt 247.0 lb

## 2022-11-07 DIAGNOSIS — I480 Paroxysmal atrial fibrillation: Secondary | ICD-10-CM

## 2022-11-07 DIAGNOSIS — I4892 Unspecified atrial flutter: Secondary | ICD-10-CM | POA: Diagnosis not present

## 2022-11-07 DIAGNOSIS — Z7901 Long term (current) use of anticoagulants: Secondary | ICD-10-CM | POA: Diagnosis not present

## 2022-11-07 DIAGNOSIS — R55 Syncope and collapse: Secondary | ICD-10-CM | POA: Diagnosis not present

## 2022-11-07 LAB — POCT INR: INR: 3.1 — AB (ref 2.0–3.0)

## 2022-11-07 NOTE — Patient Instructions (Signed)
It was nice to see you today,  We addressed the following topics today: - please call your cardiologist, tell them you've had syncopal episodes and see if they would like to see you sooner than your appointment in September.   - talk to your endocrinologist at your next visit and see if you would benefit from an insulin pump.    - please when you stand up, always use your walker, and stand for at least 30 seconds before you begin walking.    - I will see you in July and we can try to order home health PT again.    Have a great day,  Frederic Jericho, MD

## 2022-11-07 NOTE — Progress Notes (Unsigned)
Acute Office Visit  Subjective:     Patient ID: HAMMAD FINKLER, male    DOB: Aug 20, 1955, 67 y.o.   MRN: [id number]  Chief Complaint  Patient presents with   Acute Visit    Dizziness, light headed, passed out yesterday    HPI Patient is in today for dizziness   Patient states that yesterday he bent over to pick up some dog hair and then stood up, walked about 20 feet and then had a syncopal episode near the chair  does not remember falling.  Remembers waking up with his wife coming into the kitchen.  Does not feel like he hit his head.  Does feel like his "jaw hurts" along with his shoulders and knees.  This is his first syncopal episode and several years but has been more frequently in the past.  Since yesterday, he had an episode earlier this morning after returning from his Coumadin appointment in which he "felt dizzy" while sitting in his chair.  Patient has diabetes on insulin with significant diabetic neuropathy.  Sees an endocrinologist for his management.  Has a Dexcom but does not connect it to the app on his phone.  Has never discussed using insulin pump with the endocrinologist.  Patient has a cardiologist.  Thinks he may have followed up with him most recently about 1 year ago.  Had been evaluated for arrhythmias before but states that the arrhythmia monitor fell off after 24 hours when it was placed.  Discussed his aortic stenosis.  Discussed the echo from last year.  Patient lives with his wife and he states "she is not doing too well".  Son lives nearby but has not seen them recently.  Patient occasionally drives himself when the pain is not too severe from his chronic back pain.  Patient states he will get Medicare beginning July 1.  States that they have tried to get a wheelchair and home health PT in the past but have had issues with this.    ROS      Objective:    BP 124/69   Pulse 70   Temp (!) 97.5 F (36.4 C) (Oral)   Ht 6\' 2"  (1.88 m)   Wt 247 lb (112 kg)    SpO2 95%   BMI 31.71 kg/m  {Vitals History (Optional):23777}  Physical Exam General: Alert, oriented.  No acute distress CV: Regular rate and rhythm.  Orthostatic vitals normal. Pulmonary: Clear bilaterally MSK: Slowed gait, pain with laying supine.  Ambulates with help of walker.  Results for orders placed or performed in visit on 11/07/22  POCT INR  Result Value Ref Range   INR 3.1 (A) 2.0 - 3.0   POC INR          Assessment & Plan:   Syncope, unspecified syncope type Assessment & Plan: Patient has had syncopal episodes in the past.  Previous workup included cardiac monitoring.  Has had an echo that showed LVH and most recently mild to moderate aortic stenosis.  Has diabetes with significant neuropathy.  EKG today was normal.  Orthostatic vitals today was normal. His aortic stenosis, although noted to be mild to moderate, could be playing a role.  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  Could also be hypoglycemia given the patient is on a significant amount of insulin. - Advised patient to reach out to cardiology to see if they would like to see him sooner than his scheduled visit at September.  AS is moderate but if it is the cause or contributing to his syncope, he may benefit from valve replacement. Advised him to discuss this with his cardiologist.   -advised pt to keep next endocrinology appt and ask if they feel he would benefit from new cgm monitor or the use of insulin pump.  - pt's pcp has already attempted to get home health pt for patient.  Continue to work on this, may have better luck when he switches to medicare next month.   - f/u in 1 month.   - no ct head.  Neurologic exam was normal and pt does not have any signs or symptoms of head injury.    Orders: -     EKG 12-Lead     Return in about 4 weeks (around 12/05/2022).  Sandre Kitty, MD

## 2022-11-07 NOTE — Assessment & Plan Note (Signed)
Patient has had syncopal episodes in the past.  Previous workup included cardiac monitoring.  Has had an echo that showed LVH and most recently mild to moderate aortic stenosis.  Has diabetes with significant neuropathy.  EKG today was normal.  Orthostatic vitals today was normal. His aortic stenosis, although noted to be mild to moderate, could be playing a role.  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  Could also be hypoglycemia given the patient is on a significant amount of insulin. - Advised patient to reach out to cardiology to see if they would like to see him sooner than his scheduled visit at September. AS is moderate but if it is the cause or contributing to his syncope, he may benefit from valve replacement. Advised him to discuss this with his cardiologist.   -advised pt to keep next endocrinology appt and ask if they feel he would benefit from new cgm monitor or the use of insulin pump.  - pt's pcp has already attempted to get home health pt for patient.  Continue to work on this, may have better luck when he switches to medicare next month.   - f/u in 1 month.   - no ct head.  Neurologic exam was normal and pt does not have any signs or symptoms of head injury.

## 2022-11-07 NOTE — Patient Instructions (Signed)
Description   Today take 1/2 tablet of warfarin then continue taking warfarin 1/2 tablet daily except for 1 tablet every Mondays and Fridays. Recheck INR in 5 weeks. [facility] [phone]

## 2022-11-11 ENCOUNTER — Other Ambulatory Visit (HOSPITAL_COMMUNITY): Payer: Self-pay

## 2022-11-11 ENCOUNTER — Ambulatory Visit: Payer: Commercial Managed Care - HMO | Admitting: Internal Medicine

## 2022-11-11 DIAGNOSIS — L2089 Other atopic dermatitis: Secondary | ICD-10-CM | POA: Insufficient documentation

## 2022-11-11 DIAGNOSIS — L03119 Cellulitis of unspecified part of limb: Secondary | ICD-10-CM | POA: Insufficient documentation

## 2022-11-11 NOTE — Assessment & Plan Note (Signed)
Trial of clobetasol cream. -Apply twice daily to all affected areas. -Consider referral to dermatology if indicated.

## 2022-11-11 NOTE — Assessment & Plan Note (Signed)
Cellulitis of bilateral lower extremities. -Patient already started on doxycycline 100 mg twice daily per pulmonology. -Prednisone taper was added for pulmonary as well. -Take both doxycycline and prednisone until completely finished. -Reassess as indicated.

## 2022-11-14 ENCOUNTER — Telehealth: Payer: Self-pay | Admitting: *Deleted

## 2022-11-14 NOTE — Telephone Encounter (Signed)
Pt calling to find out who the provider wanted him to follow up for his legs and feet.  He mentions his foot doctor but I did not see anything noted as to who he should go to.  Please advise.

## 2022-11-15 NOTE — Telephone Encounter (Signed)
Calling pt to let him know that Dr. Constance Goltz would like him to see his podiatrist for this.     Ryan Kitty, MD  You23 minutes ago (9:34 AM)    I think it was just podiatry I discussed with him.  I didn't have a specific provider in mind.

## 2022-11-15 NOTE — Telephone Encounter (Signed)
Contacted pt and informed him of below and he wanted to inquire about the paper work for the wheelchair. Routing to previous PCP to check the status.

## 2022-11-16 ENCOUNTER — Encounter: Payer: Self-pay | Admitting: Internal Medicine

## 2022-11-16 ENCOUNTER — Other Ambulatory Visit: Payer: Commercial Managed Care - HMO

## 2022-11-16 ENCOUNTER — Ambulatory Visit (INDEPENDENT_AMBULATORY_CARE_PROVIDER_SITE_OTHER): Payer: Medicare Other | Admitting: Internal Medicine

## 2022-11-16 ENCOUNTER — Encounter: Payer: Self-pay | Admitting: Family Medicine

## 2022-11-16 VITALS — BP 120/66 | HR 76 | Ht 74.0 in | Wt 255.4 lb

## 2022-11-16 DIAGNOSIS — Z7985 Long-term (current) use of injectable non-insulin antidiabetic drugs: Secondary | ICD-10-CM | POA: Diagnosis not present

## 2022-11-16 DIAGNOSIS — E119 Type 2 diabetes mellitus without complications: Secondary | ICD-10-CM

## 2022-11-16 DIAGNOSIS — E1122 Type 2 diabetes mellitus with diabetic chronic kidney disease: Secondary | ICD-10-CM | POA: Diagnosis not present

## 2022-11-16 DIAGNOSIS — Z794 Long term (current) use of insulin: Secondary | ICD-10-CM

## 2022-11-16 DIAGNOSIS — E785 Hyperlipidemia, unspecified: Secondary | ICD-10-CM

## 2022-11-16 DIAGNOSIS — N1831 Chronic kidney disease, stage 3a: Secondary | ICD-10-CM | POA: Diagnosis not present

## 2022-11-16 LAB — HEMOGLOBIN A1C: Hemoglobin A1C: 7.8

## 2022-11-16 MED ORDER — BASAGLAR KWIKPEN 100 UNIT/ML ~~LOC~~ SOPN: 30.0000 [IU] | PEN_INJECTOR | Freq: Every day | SUBCUTANEOUS | 3 refills | Status: DC

## 2022-11-16 NOTE — Progress Notes (Signed)
Patient ID: [name], male   DOB: Dec 21, 1955, 67 y.o.   MRN: [id number]  HPI: [name] is a 67 y.o.-year-old male, returning for follow-up for DM2, dx in 2012, insulin-dependent since 2019, uncontrolled, with complications (CHF, CAD, Afib/flutter, PAD, CKD, PN). Pt. previously saw Dr. Everardo All, but last visit with me 4 months ago.  Interim history: No increased urination, nausea, chest pain.   He has muscle and joint pain.  She sees the pain clinic.   He also has weakness and walks with a cane - feels this is related to the Covid vaccines. He is not sleeping well at night.  Reviewed HbA1c: Lab Results  Component Value Date   HGBA1C 7.6 (A) 06/28/2022   HGBA1C 7.6 (A) 05/11/2022   HGBA1C 7.7 (A) 11/30/2021   HGBA1C 7.6 (A) 07/29/2021   HGBA1C 8.1 (A) 05/26/2021   HGBA1C 7.6 (A) 11/09/2020   HGBA1C 7.6 (A) 08/20/2020   HGBA1C 8.1 (A) 06/17/2020   HGBA1C 8.7 (A) 04/13/2020   HGBA1C 8.4 (A) 11/21/2019   Previously on: - Trulicity 3 mg weekly - Humalog 0-60 units 1-5x a day, before meals - max amount 270 units/day (!) Per review of Dr. George Hugh notes, time-released insulins caused a rash in the past.  Patient does not remember this at today's visit. He tried [place] but this caused dehydration. Metformin dose is limited by abdominal pain. Trulicity dose is limited by nausea. In the past, he has been missing insulin 2 to 3 days at that time per review of Dr. George Hugh notes.  Then on: - Trulicity 3 mg weekly - NovoLog >> Aspart 40-50 units 3x a day, before meals - Basaglar 70 >> 75 units at bedtime  Now on: - Trulicity 3 mg weekly - U500 insulin - misses doses, takes it mostly after the meals, or misses 120-140 >> actually taking 60-80  units of insulin in the morning   70-100 >> 90-110 >> actually taking 40-60 units in the evening  Pt checks his sugars >4x a day with his Dexcom CGM:   Previously:   Previously:  Lowest sugar was 60s >> 120 >> 70s-80s >>  50s; he has hypoglycemia awareness at 110.  Highest sugar was 600 >> 300s >> 200s >> 300s  Glucometer: ReliOn  Pt's meals are: - Breakfast: sausage biscuit - Lunch: banana sandwich, leftovers - Dinner: meat and veggies, seldom cake - Snacks: occas. - pasta, etc.  - + CKD, last BUN/creatinine:  Lab Results  Component Value Date   BUN 28 (H) 08/01/2022   BUN 22 03/01/2022   CREATININE 1.25 08/01/2022   CREATININE 1.50 03/01/2022  On diltiazem, Cozaar 50 mg daily.  -+ HL; last set of lipids: Lab Results  Component Value Date   CHOL 154 08/01/2022   HDL 23 (L) 08/01/2022   LDLCALC 63 08/01/2022   LDLDIRECT 99.9 09/08/2011   TRIG 437 (H) 08/01/2022   CHOLHDL 6.7 (H) 08/01/2022  Off pravastatin 80 mg daily - stopped 2/2 mm weakness.  He was started on Crestor 10 mg daily 10/2022.  - last eye exam was in 2024. Reportedly No DR. Incipient cataracts.  - + numbness and tingling in his feet. Also, significant pain.  Last foot exam 12/14/2021.  On Neurontin 300 mg 3 capsules twice daily. He sees pain management. He sees podiatry.  He also has a history of HTN, OSA, COPD, GERD, DDD, gout. He is still smoking.  ROS: + see HPI  Past Medical History:  Diagnosis Date  Actinic keratosis    Arthritis    lower back   Asthma    Atrial fibrillation [facility])    Atrial flutter (HCC)    s/p CTI ablation by Dr Johney Frame   Brain aneurysm 2009   questionable. A follow up CTA in 2009 showed no evidence of   Chronic back pain    DDD/stenosis   Colon polyps    9 polyps removed 10/13/11   Complication of anesthesia 09/11/2012   slow to awaken after ablation   COPD (chronic obstructive pulmonary disease) (HCC)    Diabetes mellitus    takes Metformin and Glimepiride daily   Emphysema    GERD (gastroesophageal reflux disease)    takes Omeprazole bid   Heart failure (HCC)    History of shingles    HLD (hyperlipidemia)    takes Pravastatin daily   HTN (hypertension)    takes Prinizide daily    Obesity    OSA (obstructive sleep apnea)    not always using cpap   Overdose 2009   unintentional Flecanide overdose   Peripheral neuropathy    Short-term memory loss    Tobacco abuse    Past Surgical History:  Procedure Laterality Date   ANTERIOR CERVICAL DECOMP/DISCECTOMY FUSION  01/04/2012   Procedure: ANTERIOR CERVICAL DECOMPRESSION/DISCECTOMY FUSION 1 LEVEL/HARDWARE REMOVAL;  Surgeon: Cristi Loron, MD;  Location: [facility] NEURO ORS;  Service: Neurosurgery;  Laterality: N/A;  explore cervical fusion Cervical six - seven  with removal of codman plate anterior cervical decompression with fusion interbody prothesis plating and bonegraft   APPENDECTOMY  2-10yrs ago   ATRIAL FIBRILLATION ABLATION N/A 09/11/2012   PT DID NOT HAVE AN ATRIAL FIBRILLATION ABLATION IN 2014!  ATRIAL FLUTTER ABLATION ONLY   ATRIAL FLUTTER ABLATION  09/11/2012   CTI ablation by Dr Johney Frame   BIOPSY  11/07/2019   Procedure: BIOPSY;  Surgeon: Rachael Fee, MD;  Location: [facility] ENDOSCOPY;  Service: Endoscopy;;   CARDIAC CATHETERIZATION  2008   no significant CAD   CARDIOVERSION  05/05/2011   Procedure: CARDIOVERSION;  Surgeon: Lewayne Bunting, MD;  Location: [facility] ENDOSCOPY;  Service: Cardiovascular;  Laterality: N/A;   CARDIOVERSION Bilateral 07/26/2012   Procedure: CARDIOVERSION;  Surgeon: Rollene Rotunda, MD;  Location: [facility] ENDOSCOPY;  Service: Cardiovascular;  Laterality: Bilateral;   CARPAL TUNNEL RELEASE  99/2000   bilateral   COLONOSCOPY WITH PROPOFOL N/A 12/13/2012   Procedure: COLONOSCOPY WITH PROPOFOL;  Surgeon: Rachael Fee, MD;  Location: [facility] ENDOSCOPY;  Service: Endoscopy;  Laterality: N/A;   ELECTROPHYSIOLOGIC STUDY N/A 04/21/2015   Procedure: Atrial Fibrillation Ablation;  Surgeon: Hillis Range, MD;  Location: [facility] INVASIVE CV LAB;  Service: Cardiovascular;  Laterality: N/A;   ESOPHAGOGASTRODUODENOSCOPY (EGD) WITH PROPOFOL N/A 11/07/2019   Procedure: ESOPHAGOGASTRODUODENOSCOPY (EGD) WITH PROPOFOL;  Surgeon:  Rachael Fee, MD;  Location: [facility] ENDOSCOPY;  Service: Endoscopy;  Laterality: N/A;   HERNIA REPAIR     KNEE ARTHROSCOPY WITH MEDIAL MENISECTOMY Left 04/23/2021   Procedure: LEFT KNEE ARTHROSCOPY WITH PARTIAL MEDIAL MENISCECTOMY;  Surgeon: Eldred Manges, MD;  Location: [facility] OR;  Service: Orthopedics;  Laterality: Left;   KNEE SURGERY  6-10yrs ago   left   LEFT HEART CATH AND CORONARY ANGIOGRAPHY N/A 09/19/2016   Procedure: Left Heart Cath and Coronary Angiography;  Surgeon: Lyn Records, MD;  Location: [facility] INVASIVE CV LAB;  Service: Cardiovascular;  Laterality: N/A;   Left inguinal hernia repair     as a child   NASAL SEPTOPLASTY W/ TURBINOPLASTY Bilateral  02/19/2014   Procedure: NASAL SEPTOPLASTY WITH BILATERAL TURBINATE REDUCTION;  Surgeon: Flo Shanks, MD;  Location: [facility] OR;  Service: ENT;  Laterality: Bilateral;   NECK SURGERY  21yrs ago   right shoulder surgery  4-45yrs ago   cyst removed   TEE WITHOUT CARDIOVERSION  05/05/2011   Procedure: TRANSESOPHAGEAL ECHOCARDIOGRAM (TEE);  Surgeon: Lewayne Bunting, MD;  Location: [facility] ENDOSCOPY;  Service: Cardiovascular;  Laterality: N/A;   TEE WITHOUT CARDIOVERSION N/A 04/21/2015   Procedure: TRANSESOPHAGEAL ECHOCARDIOGRAM (TEE);  Surgeon: Quintella Reichert, MD;  Location: [facility] ENDOSCOPY;  Service: Cardiovascular;  Laterality: N/A;   THROAT SURGERY  4-61yrs ago   "thought " it was cancer but came back not   TONSILLECTOMY     Social History   Socioeconomic History   Marital status: Married    Spouse name: Not on file   Number of children: 2   Years of education: Not on file   Highest education level: Not on file  Occupational History   Occupation: Curator   Tobacco Use   Smoking status: Every Day    Packs/day: 1.00    Years: 49.00    Additional pack years: 0.00    Total pack years: 49.00    Types: Cigarettes   Smokeless tobacco: Never   Tobacco comments:    Smoking almost a pack per day Patient is vaping daily.  09/28/2022 am  Vaping Use    Vaping Use: Every day  Substance and Sexual Activity   Alcohol use: Not Currently    Alcohol/week: 0.0 standard drinks of alcohol    Comment: 12/05/17 pt stated he has drinked 1/2 drink in 3 years   Drug use: No   Sexual activity: Yes  Other Topics Concern   Not on file  Social History Narrative   Daily caffeine(Mountain Dew) Lives in [place] with spouse.Unemployed due to chronic back/ leg pain         Are you right handed or left handed? Left Handed    Are you currently employed ? No    What is your current occupation? No   Do you live at home alone? no   Who lives with you? Lives with wife and 3 doggies   What type of home do you live in: 1 story or 2 story? Lives in one story home       Social Determinants of Health   Financial Resource Strain: Not on file  Food Insecurity: Not on file  Transportation Needs: Not on file  Physical Activity: Not on file  Stress: Not on file  Social Connections: Not on file  Intimate Partner Violence: Not on file   Current Outpatient Medications on File Prior to Visit  Medication Sig Dispense Refill   albuterol (PROVENTIL) (2.5 MG/3ML) 0.083% nebulizer solution Take 3 mLs (2.5 mg total) by nebulization every 6 (six) hours as needed for wheezing or shortness of breath. 75 mL 12   albuterol (VENTOLIN HFA) 108 (90 Base) MCG/ACT inhaler INHALE 1-2 PUFFS INTO THE LUNGS EVERY 6 HOURS AS NEEDED FOR WHEEZING OR SHORTNESS OF BREATH 8.5 g 5   b complex vitamins tablet Take 1 tablet by mouth daily.      bisoprolol (ZEBETA) 10 MG tablet Take 1 tablet (10 mg total) by mouth daily. 90 tablet 1   clobetasol cream (TEMOVATE) 0.05 % Apply 1 Application topically 2 (two) times daily. 60 g 2   colchicine 0.6 MG tablet TAKE 2 TABLETS BY MOUTH AT ONCE. THEN TAKE 1 TABLET  1 HOUR LATER. WAIT 2 DAYS, THEN BEGIN TAKING 1 TABLET DAILY TO PREVENT ACUTE GOUT. 33 tablet 2   Continuous Blood Gluc Sensor (DEXCOM G7 SENSOR) MISC 3 each by Does not apply route every 30  (thirty) days. Apply 1 sensor every 10 days 9 each 4   Continuous Blood Gluc Transmit (DEXCOM G6 TRANSMITTER) MISC USE AS DIRECTED 1 each 3   Continuous Glucose Receiver (DEXCOM G7 RECEIVER) DEVI See admin instructions.     diltiazem (CARDIZEM CD) 120 MG 24 hr capsule Take 1 capsule (120 mg total) by mouth daily. 90 capsule 1   Dulaglutide (TRULICITY) 3 MG/0.5ML SOPN Inject 3 mg as directed once a week. 6 mL 3   famotidine (PEPCID) 20 MG tablet One after supper 30 tablet 11   febuxostat (ULORIC) 40 MG tablet Take 1 tablet (40 mg total) by mouth daily. 30 tablet 3   Fluticasone-Umeclidin-Vilant (TRELEGY ELLIPTA) 100-62.5-25 MCG/ACT AEPB Inhale 1 puff into the lungs every morning. 6 each 0   Fluticasone-Umeclidin-Vilant (TRELEGY ELLIPTA) 100-62.5-25 MCG/ACT AEPB One click each am 28 each 5   gabapentin (NEURONTIN) 300 MG capsule TAKE 3 CAPSULES BY MOUTH THREE TIMES DAILY FOR NEUROPATHY 540 capsule 2   Glucagon 3 MG/DOSE POWD Place 3 mg into the nose once as needed for up to 1 dose. 1 each 11   Insulin Pen Needle (EASY COMFORT PEN NEEDLES) 31G X 5 MM MISC To use with as needed insulin dosing up to four times daily 400 each 3   insulin regular human CONCENTRATED (HUMULIN R U-500 KWIKPEN) 500 UNIT/ML KwikPen Inject 260 Units into the skin daily. Use up to 250-260 units of insulin daily as advised 48 mL 1   ketoconazole (NIZORAL) 2 % cream Apply 1 Application topically daily. 60 g 2   losartan (COZAAR) 50 MG tablet TAKE 1 TABLET BY MOUTH DAILY 90 tablet 4   metolazone (ZAROXOLYN) 2.5 MG tablet TAKE 1 TABLET BY MOUTH ON TUESDAY AND THURSDAY 30 MINUTES BEFORE TAKING TORSEMIDE OR AS DIRECTED 45 tablet 0   mupirocin ointment (BACTROBAN) 2 % Ply small amount to effected area twice daily for 10 days. 22 g 1   nitroGLYCERIN (NITROSTAT) 0.4 MG SL tablet Place 1 tablet (0.4 mg total) under the tongue every 5 (five) minutes x 3 doses as needed for chest pain. 25 tablet 3   oxyCODONE-acetaminophen (PERCOCET) 10-325  MG tablet Take 1.5 tablets by mouth in the morning, at noon, in the evening, and at bedtime.     pantoprazole (PROTONIX) 40 MG tablet Take 1 tablet (40 mg total) by mouth daily. Take 30-60 min before first meal of the day 30 tablet 11   potassium chloride SA (KLOR-CON M) 20 MEQ tablet TAKE 1 TABLET BY MOUTH DAILY. TAKE AN EXTRA TABLET ON TUESDAY AND THURSDAY 30 tablet 6   predniSONE (DELTASONE) 10 MG tablet 2 daily until breathing/cough better then 1 x 3 days and stop     rosuvastatin (CRESTOR) 10 MG tablet Take 1 tablet (10 mg total) by mouth daily. 90 tablet 1   sulfamethoxazole-trimethoprim (BACTRIM DS) 800-160 MG tablet Take 1 tablet by mouth 2 (two) times daily. 28 tablet 0   tacrolimus (PROTOPIC) 0.1 % ointment Apply topically 2 (two) times daily. 100 g 2   tamsulosin (FLOMAX) 0.4 MG CAPS capsule TAKE 1 CAPSULE BY MOUTH EACH NIGHT AT BEDTIME 30 capsule 11   torsemide (DEMADEX) 20 MG tablet Take 2 tablets in the am and 1 tablet in the pm 270 tablet  2   triamcinolone cream (KENALOG) 0.1 % Apply 1 Application topically daily as needed. 453 g 2   VITAMIN A PO Take 2,400 mcg by mouth daily.      Vitamin D, Ergocalciferol, (DRISDOL) 1.25 MG (50000 UNIT) CAPS capsule Take 1 capsule (50,000 Units total) by mouth every 7 (seven) days. 12 capsule 3   warfarin (COUMADIN) 5 MG tablet Take 1/2 to 1 tablet daily or as directed 70 tablet 1   No current facility-administered medications on file prior to visit.   Allergies  Allergen Reactions   Adhesive [Tape]     itching   Latex Itching    When tape is on the skin too long skin gets red & itching   Family History  Problem Relation Age of Onset   Diabetes Mother    Heart disease Father    Lung cancer Father    Diabetes Maternal Grandmother    Colon cancer Neg Hx    Anesthesia problems Neg Hx    Hypotension Neg Hx    Malignant hyperthermia Neg Hx    Pseudochol deficiency Neg Hx    Esophageal cancer Neg Hx    Pancreatic cancer Neg Hx     Stomach cancer Neg Hx    PE: BP 120/66   Pulse 76   Ht 6\' 2"  (1.88 m)   Wt 255 lb 6.4 oz (115.8 kg)   SpO2 96%   BMI 32.79 kg/m  Wt Readings from Last 3 Encounters:  11/16/22 255 lb 6.4 oz (115.8 kg)  11/07/22 247 lb (112 kg)  10/24/22 254 lb (115.2 kg)   Constitutional: overweight, in NAD, walks with a cane Eyes: no exophthalmos ENT: no thyromegaly, no cervical lymphadenopathy Cardiovascular: RRR, No MRG Respiratory: CTA B L lung; + wheezing RLL Musculoskeletal: no deformities Skin: + rash B shins - ?NLD; also cigarette burns on bilateral legs as he dropped his cigarette while sleeping Neurological: +  tremor with outstretched hands  ASSESSMENT: 1. DM2, insulin-dependent, uncontrolled, with complications - CHF - A fib/flutter - PAD - CKD stage III - PN - CAD  2. HL - fatty liver  PLAN:  1. Patient with longstanding, uncontrolled, insulin resistant type 2 diabetes, on injectable antidiabetic regimen with GLP-1 receptor agonist and U-500 insulin with stable control.  At last visit, HbA1c was 7.6%, unchanged.  Sugars were better in the previous 2 weeks but they were still above target, increasing after the first meal of the day, and then improving after dinner.  She was skipping U-500 doses when sugars were at goal, and I advised him not to do so.  We also increase the U-500 insulin dose before dinner.  We continued the same dose of Trulicity.  Of note, he could not tolerate a higher dose due to GI symptoms. CGM interpretation: -At today's visit, we reviewed his CGM downloads: It appears that 28% of values are in target range (goal >70%), while 72% are higher than 180 (goal <25%), and 0% are lower than 70 (goal <4%).  The calculated average blood sugar is 202.  The projected HbA1c for the next 3 months (GMI) is 8.1%. -Reviewing the CGM trends, sugars are fluctuating mainly above the upper limit of the target range, between 180 and 250.  He occasionally drops his sugars in the  normal range, and occasionally lower, and upon questioning, this may be due to taking the U-500 insulin after meals.  He mentions that this is quite frequent, and also, he has instances in which she  is not taking the insulin due to fear of dropping blood sugars too low if sugars are at target before the meals.  Neither of these practices are conducive to good diabetes control.  We discussed about the absolute importance of taking the insulin 30 minutes before meals and not to skip the insulin completely.  He is also taking more doses than recommended and for now we can continue with these provided that he takes them correctly.  To bring all of his blood sugars in the lower range, I did suggest to add a long-acting insulin (Basaglar, which is covered for him), and we may need to increase the dose at next visit.  He agrees to restart this. - I suggested to:  Patient Instructions  Please continue: - Trulicity 3 mg weekly - U500 insulin - 30 min before meals 60-80 units in the morning  40-60 units in the evening  Try to start: - Basaglar 30 units daily at bedtime  Please return in 3 months.  - we checked his HbA1c: 7.8% (higher) - advised to check sugars at different times of the day - 4x a day, rotating check times - advised for yearly eye exams >> he is UTD - he has severe peripheral neuropathy for which she sees podiatry and pain management.   -I will see him back in 3 months  2. HL -Reviewed latest lipid panel from 07/2022: LDL slightly above our target of less than 55, triglycerides high, HDL low: Lab Results  Component Value Date   CHOL 154 08/01/2022   HDL 23 (L) 08/01/2022   LDLCALC 63 08/01/2022   LDLDIRECT 99.9 09/08/2011   TRIG 437 (H) 08/01/2022   CHOLHDL 6.7 (H) 08/01/2022  -He was taken off pravastatin 80 mg daily due to muscle weakness.  However, he felt that the muscle weakness was more related to the spine. -He was started on Crestor 10 mg daily  Carlus Pavlov, MD  PhD [facility] Endocrinology

## 2022-11-16 NOTE — Patient Instructions (Addendum)
Please continue: - Trulicity 3 mg weekly - U500 insulin - 30 min before meals 60-80 units in the morning  40-60 units in the evening  Try to start: - Basaglar 30 units daily at bedtime  Please return in 3 months.

## 2022-11-21 ENCOUNTER — Inpatient Hospital Stay: Admission: RE | Admit: 2022-11-21 | Payer: Commercial Managed Care - HMO | Source: Ambulatory Visit

## 2022-11-23 ENCOUNTER — Other Ambulatory Visit: Payer: Self-pay | Admitting: Cardiology

## 2022-11-23 DIAGNOSIS — J449 Chronic obstructive pulmonary disease, unspecified: Secondary | ICD-10-CM | POA: Diagnosis not present

## 2022-11-23 DIAGNOSIS — M5136 Other intervertebral disc degeneration, lumbar region: Secondary | ICD-10-CM | POA: Diagnosis not present

## 2022-11-23 DIAGNOSIS — M5030 Other cervical disc degeneration, unspecified cervical region: Secondary | ICD-10-CM | POA: Diagnosis not present

## 2022-11-25 ENCOUNTER — Ambulatory Visit: Payer: Medicare Other | Admitting: Podiatry

## 2022-11-25 ENCOUNTER — Encounter: Payer: Self-pay | Admitting: Podiatry

## 2022-11-25 ENCOUNTER — Ambulatory Visit (INDEPENDENT_AMBULATORY_CARE_PROVIDER_SITE_OTHER): Payer: Medicare Other | Admitting: Podiatry

## 2022-11-25 VITALS — BP 132/71 | HR 66

## 2022-11-25 DIAGNOSIS — E0843 Diabetes mellitus due to underlying condition with diabetic autonomic (poly)neuropathy: Secondary | ICD-10-CM | POA: Diagnosis not present

## 2022-11-25 DIAGNOSIS — B353 Tinea pedis: Secondary | ICD-10-CM

## 2022-11-25 MED ORDER — CLOTRIMAZOLE-BETAMETHASONE 1-0.05 % EX CREA: 1.0000 | TOPICAL_CREAM | Freq: Every day | CUTANEOUS | 3 refills | Status: DC

## 2022-11-25 NOTE — Progress Notes (Signed)
Chief Complaint  Patient presents with   Tinea Pedis    "Whatever is going on with my skin." N - skin rash L - ankle and feet bilateral D - after I was here last O - gradually worse C - burns, scaly, red A - lotion they gave me to put on it T - prescribed lotion    Subjective: Patient is a 67 y.o. male presenting to the office today for follow-up evaluation regarding pain and tenderness associated to the bilateral feet.  He continues to have burning and numbness sensation to the feet secondary to neuropathy.  He has also noticed skin discoloration.  He states that he does have a history of fungal infection to his hands.  He is currently applying Temovate ointment with no improvement.  Past Medical History:  Diagnosis Date   Actinic keratosis    Arthritis    lower back   Asthma    Atrial fibrillation [facility])    Atrial flutter [facility])    s/p CTI ablation by Dr Johney Frame   Brain aneurysm 2009   questionable. A follow up CTA in 2009 showed no evidence of   Chronic back pain    DDD/stenosis   Colon polyps    9 polyps removed 10/13/11   Complication of anesthesia 09/11/2012   slow to awaken after ablation   COPD (chronic obstructive pulmonary disease) (HCC)    Diabetes mellitus    takes Metformin and Glimepiride daily   Emphysema    GERD (gastroesophageal reflux disease)    takes Omeprazole bid   Heart failure (HCC)    History of shingles    HLD (hyperlipidemia)    takes Pravastatin daily   HTN (hypertension)    takes Prinizide daily   Obesity    OSA (obstructive sleep apnea)    not always using cpap   Overdose 2009   unintentional Flecanide overdose   Peripheral neuropathy    Short-term memory loss    Tobacco abuse     Objective:  Physical Exam General: Alert and oriented x3 in no acute distress  Dermatology: No open wounds.  Hyperkeratotic dystrophic nails noted but they are short and maintained each.  Diffuse hyperkeratosis of skin with discoloration noted being at  the midportion of the leg extending distally.  Please see above noted photo  Vascular: VAS [place] ABI W/WO TBI 12/23/2021 ABI Findings:  +---------+------------------+-----+---------+--------+  Right   Rt Pressure (mmHg)IndexWaveform Comment   +---------+------------------+-----+---------+--------+  Brachial 127                                       +---------+------------------+-----+---------+--------+  PTA     197               1.47 triphasic          +---------+------------------+-----+---------+--------+  DP      [place]                     triphasic          +---------+------------------+-----+---------+--------+  Great Toe90                0.67                    +---------+------------------+-----+---------+--------+   +---------+------------------+-----+---------+-------+  Left    Lt Pressure (mmHg)IndexWaveform Comment  +---------+------------------+-----+---------+-------+  Brachial 134                                      +---------+------------------+-----+---------+-------+  PTA     180               1.34 triphasic         +---------+------------------+-----+---------+-------+  DP      168               1.25 triphasic         +---------+------------------+-----+---------+-------+  Great Toe82                0.61                   +---------+------------------+-----+---------+-------+   +-------+-----------+-----------+------------+------------+  ABI/TBIToday's ABIToday's TBIPrevious ABIPrevious TBI  +-------+-----------+-----------+------------+------------+  Right [place]         0.67       [place]          1.08          +-------+-----------+-----------+------------+------------+  Left  [place]         0.61       1.26        0.77          +-------+-----------+-----------+------------+------------+  Arterial wall calcification precludes accurate ankle pressures and ABIs.    Summary:  Right: Resting right  ankle-brachial index indicates noncompressible right  lower extremity arteries. The right toe-brachial index is abnormal.   Left: Resting left ankle-brachial index indicates noncompressible left  lower extremity arteries. The left toe-brachial index is abnormal.   Neurological: Light touch and protective threshold diminished bilaterally.  Paresthesia with pins-and-needles and burning sensation noted  Musculoskeletal Exam: Tenderness throughout palpation of the bilateral feet.  Limited range of motion.  Call EpiFix  Assessment: 1.  T2DM with peripheral polyneuropathy 2.  Encounter for diabetic foot exam  -Patient evaluated - The patient may be experiencing a fungal component to the hyperkeratosis of the skin of the feet.  Also suggestive of venous insufficiency -Prescription for Lotrisone cream to help address any fungal component in addition to the topical steroid -Return to clinic as needed   Felecia Shelling, DPM [facility]  Dr. Felecia Shelling, DPM    [address]                                        [place], [place] [address]                Office [phone]  Fax [phone]

## 2022-11-29 ENCOUNTER — Ambulatory Visit: Payer: Medicare Other | Admitting: Physician Assistant

## 2022-11-29 NOTE — Progress Notes (Signed)
Amended 11/29/2022 In addition to the information stated below, a scooter is also ruled out (4) due to decreased grip strength secondary to bilateral upper extremity tremor.  [name] communicated in her note from 10/06/2022 that the patient was alert and oriented with no psychiatric deficit.  The patient has the mental capacity (5) to operate a power wheelchair, and his decision to schedule an appointment in order to address his need for a power wheelchair also demonstrates a willingness to use it in the home (5).  Note 10/06/2022 from Mobility Assessment with Ryan Gros NP: Vitals: height 6\' 1"    weight 251 lbs (1a) Assessment & Plan: "Having an electric wheelchair but is able the patient to move freely around his home without assistance without fear of falling. Will allow him to cook and participate in other activities of daily living (2) such as dressing, bathing, and taking care of household chores."   Original letter: November 16, 2022  Patient: [name]  MRN: [id number]  Date of Birth: 09-20-1955  Date of Visit: 11/16/2022    The original request for a power wheelchair for [name] was made by Ryan Gros NP.  She has since left our practice, so I received the fax and will respond to the best of my ability.  I have not personally met this patient.  As stated in office visit note from encounter 10/06/2022, legs are so weak patient is unable to stand up to cook demonstrated increased leg weakness, additionally, he has bilateral foot drop resulting in frequent falls as noted in assessment and plan section.  This makes cane and walker insufficient (3) for assistance with ambulation.  He has tremor in bilateral upper extremities in addition to weak grip strength.  This limits the patient from having the upper body strength required to propel a manual wheelchair (3).  [name] also noted in the physical exam that the patient has moderate generalized weakness.  Additionally, in the neurological  section she notes that strength is reduced bilaterally.  In my professional opinion, I would take this to mean that upper and lower extremity strength is 2-3/5, though likely even lower given that he cannot support his body weight (1b, 3a) with his legs long enough to stand while cooking.  In addition to this, he has a history of COPD with increased shortness of breath on exertion and atrial fibrillation that contributes to shortness of breath on exertion as well.  He is followed by cardiology, but any form of ambulatory assistance that requires increased effort on his part is likely to cause shortness of breath.  I hope that this is enough information to fill the rest of the clinical picture of this patient in conjunction with [name]'s clinical documentation.  Please let me know if you need any further information to get this patient the support that he needs to move around safely.    Sincerely,   [name]

## 2022-11-30 ENCOUNTER — Telehealth: Payer: Self-pay | Admitting: *Deleted

## 2022-11-30 NOTE — Telephone Encounter (Signed)
Unfortunately, I do not have any other updates for him at this time.  We sent in the and did not know which should include all of the additional information that they were asking for.  I can send him a message if they come back with anything else they need.

## 2022-11-30 NOTE — Telephone Encounter (Signed)
Pt calling inquiring about the power wheelchair, informed him that I see where there was an addended note from 11/29/22.  Informed him that the provider has submitted multiple things since previous PCP has left and that the process is a long process.   Informed him that I would see if provider has had any back of the house communication about this that I cannot see.

## 2022-12-05 NOTE — Telephone Encounter (Signed)
Contacted pt and informed him of below and he said that someone actually came out to his home in reference to the wheelchair to evaluate his ability to use around home.

## 2022-12-07 ENCOUNTER — Telehealth: Payer: Self-pay | Admitting: *Deleted

## 2022-12-07 NOTE — Telephone Encounter (Signed)
Contacted pt to give him contact information for [employer] [phone]. He also was wanting to know where to get the adapter for his car so he could transport the wheelchair.  Told him he could look on internet or he could call some car dealerships for recommendations.

## 2022-12-07 NOTE — Telephone Encounter (Signed)
-----   Message from Langley Adie sent at 12/06/2022  2:59 PM EDT ----- Regarding: pt cld re: Wheelchair & car adapter Patient called states he needs an update re: Ordered Wheelchair & forgot to ask the Name/phone# of DME company that is to supply it. --Patient also says he doesn't know if there was an order for the Car to be adapted for wheelchair mobility( that's for him to carry it on the back of/or inside automobile. Please call him

## 2022-12-09 ENCOUNTER — Other Ambulatory Visit: Payer: Self-pay | Admitting: Nurse Practitioner

## 2022-12-09 DIAGNOSIS — M1A29X1 Drug-induced chronic gout, multiple sites, with tophus (tophi): Secondary | ICD-10-CM

## 2022-12-12 ENCOUNTER — Ambulatory Visit: Payer: Medicare Other

## 2022-12-14 ENCOUNTER — Other Ambulatory Visit: Payer: Self-pay | Admitting: Podiatry

## 2022-12-14 ENCOUNTER — Telehealth: Payer: Self-pay

## 2022-12-14 MED ORDER — CLOTRIMAZOLE-BETAMETHASONE 1-0.05 % EX CREA: 1.0000 | TOPICAL_CREAM | Freq: Every day | CUTANEOUS | 5 refills | Status: DC

## 2022-12-14 NOTE — Telephone Encounter (Signed)
Dr. Logan Bores patient - called requesting a new prescription for lotrisone cream - he used up the first prescription in 2 weeks and the pharmacy won't refill it because it's too soon. Can we send in a larger amount that will last him a month? Please advise Thanks!

## 2022-12-14 NOTE — Telephone Encounter (Signed)
I resent in the prescription for 45 g tube which is the biggest we can prescribe. Thanks

## 2022-12-15 ENCOUNTER — Ambulatory Visit: Payer: Medicare Other | Attending: Cardiology | Admitting: *Deleted

## 2022-12-15 DIAGNOSIS — I4892 Unspecified atrial flutter: Secondary | ICD-10-CM

## 2022-12-15 DIAGNOSIS — Z7901 Long term (current) use of anticoagulants: Secondary | ICD-10-CM

## 2022-12-15 DIAGNOSIS — I480 Paroxysmal atrial fibrillation: Secondary | ICD-10-CM | POA: Diagnosis not present

## 2022-12-15 LAB — POCT INR: INR: 3.2 — AB (ref 2.0–3.0)

## 2022-12-15 NOTE — Patient Instructions (Signed)
Description   Do not take any warfarin today then START taking warfarin 1/2 tablet daily except for 1 tablet every Fridays. Recheck INR in 3 weeks. [facility] [phone]

## 2022-12-22 DIAGNOSIS — I872 Venous insufficiency (chronic) (peripheral): Secondary | ICD-10-CM | POA: Diagnosis not present

## 2022-12-22 DIAGNOSIS — Z9889 Other specified postprocedural states: Secondary | ICD-10-CM | POA: Diagnosis not present

## 2022-12-22 DIAGNOSIS — K7581 Nonalcoholic steatohepatitis (NASH): Secondary | ICD-10-CM | POA: Diagnosis not present

## 2022-12-22 DIAGNOSIS — G629 Polyneuropathy, unspecified: Secondary | ICD-10-CM | POA: Diagnosis not present

## 2022-12-22 DIAGNOSIS — Z8739 Personal history of other diseases of the musculoskeletal system and connective tissue: Secondary | ICD-10-CM | POA: Diagnosis not present

## 2022-12-22 DIAGNOSIS — E1140 Type 2 diabetes mellitus with diabetic neuropathy, unspecified: Secondary | ICD-10-CM | POA: Diagnosis not present

## 2022-12-22 DIAGNOSIS — I10 Essential (primary) hypertension: Secondary | ICD-10-CM | POA: Diagnosis not present

## 2022-12-22 DIAGNOSIS — E119 Type 2 diabetes mellitus without complications: Secondary | ICD-10-CM | POA: Diagnosis not present

## 2022-12-22 DIAGNOSIS — I480 Paroxysmal atrial fibrillation: Secondary | ICD-10-CM | POA: Diagnosis not present

## 2022-12-22 DIAGNOSIS — G4733 Obstructive sleep apnea (adult) (pediatric): Secondary | ICD-10-CM | POA: Diagnosis not present

## 2022-12-22 DIAGNOSIS — J418 Mixed simple and mucopurulent chronic bronchitis: Secondary | ICD-10-CM | POA: Diagnosis not present

## 2022-12-22 DIAGNOSIS — K219 Gastro-esophageal reflux disease without esophagitis: Secondary | ICD-10-CM | POA: Diagnosis not present

## 2022-12-22 DIAGNOSIS — I272 Pulmonary hypertension, unspecified: Secondary | ICD-10-CM | POA: Diagnosis not present

## 2022-12-22 DIAGNOSIS — M5416 Radiculopathy, lumbar region: Secondary | ICD-10-CM | POA: Diagnosis not present

## 2022-12-22 DIAGNOSIS — Z794 Long term (current) use of insulin: Secondary | ICD-10-CM | POA: Diagnosis not present

## 2022-12-24 DIAGNOSIS — M5030 Other cervical disc degeneration, unspecified cervical region: Secondary | ICD-10-CM | POA: Diagnosis not present

## 2022-12-24 DIAGNOSIS — J449 Chronic obstructive pulmonary disease, unspecified: Secondary | ICD-10-CM | POA: Diagnosis not present

## 2022-12-24 DIAGNOSIS — M5136 Other intervertebral disc degeneration, lumbar region: Secondary | ICD-10-CM | POA: Diagnosis not present

## 2022-12-25 NOTE — Progress Notes (Deleted)
Subjective:    Patient ID: [name], male    DOB: 1955-07-03    MRN: [id number]  HPI PCP - Aida Puffer  45 yowm, active smoker with atrial fibrillation for FU of COPD & sleep apnea.  He had atrial fibrillation treated with flecainide in the past. He had TEE/DCCV. He is maintaining sinus rhythm,is on anticoagulation. 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  He reports episodes of coughing which have made him 'pass out'. He reports sinus drainage for many years but has never seen ENT.  He does have severe sleep apnea but couldn't afford CPAP. Review[facility] - severe with AHI 31.h corrected by CPAP 5 cm (wt was 250 lbs)  He smokes 1 PPD, drinks alcohol -'on weekends' - but I suspect more.   11/22/11 pre-op clearance for neck sx >> given course of prednisone for asthmatic bronchitis  dulera 100/5  Can use albuterol as needed.  Was set up with cpap  4-5 cigs/d, smoked 1/2 pack today due to stress CXR 11/21/11 no infx/ effusion He is not compliant with dulera, He has no income now & is unable to afford expensive meds Med review shows lisinopril & propranolol -which may be affecting his breathing rec STOP taking zestoretic Take HCTZ 25 mg daily instead I will ask your heart doctor to change propranolol to another medication You have to STOP smoking Stay on dulera 2 puffs twice daily until surgery  Take albuterol 2 puffs for emergency Stay on CPAP every night & right after surgery   12/23/2011 ov/acute work in [place], still smoking, lost instructions and may still be on ace, very hoarse, inhalers not helping.  No unusual cough, purulent sputum or sinus/hb symptoms on present rx. Chest feels generally tighter x 2 weeks and difficulty lying down and night due to sob. Rec STOP taking zestoretic Take HCTZ 25 mg daily instead  change propranolol to Bystolic 10 mg one  dialy  You have to STOP smoking before smoking stops you Stay on dulera 2 puffs in am and 2 puffs in pm  Take albuterol 2 puffs for emergency Prednisone 10 mg take  4 each am x 2 days,   2 each am x 2 days,  1 each am x2days and stop  Stop fish oil for now   08/04/2022  Re-establish ov/[name] re: Mild/mod AS/ AB still smoking maint on Anoro once each hs Chief Complaint  Patient presents with   Consult    Patient switching from Dr. Vassie Loll.  Does not want to go to [facility] location.  C/o chest congestion and dyspnea started yesterday (08/03/2022).  Patient has tremor in both hands - he is unable to use the ellipta inhalers.  Dyspnea:  limited more by pain than breathing  Cough: clear mucus,worse x 24 h PTA Sleeping: flat bed, right side down down, one pillow  SABA use: no neb in months/ inhaler several days prior to OV   02: none  Covid status:   vax x 3  Lung cancer screening :  q march   Rec Stop anoro and all smoking Plan A = Automatic = Always=    BREO 100 one each am  Plan B = Backup (to supplement plan A, not to replace it) Only use your albuterol inhaler as a rescue medication  Plan C = Crisis (instead of Plan B but only  if Plan B stops working) - only use your albuterol nebulizer if you first try Plan B  Plan D = Deltasone  If ABC aren't working well, take prednisone 10 mg x 2 each am with breakfast, then 1 daily x 3 days and stop   Please schedule a follow up office visit in 6 weeks, call sooner if needed with all medications /inhalers/ solutions in hand    09/28/2022  f/u ov/[name] re: AB/AS  maint on anoro prn due to cost  brought all meds x for rescue saba hfa and neb solution  Chief Complaint  Patient presents with   Follow-up   Dyspnea:  back and legs stop him before breathing  Cough: better  Sleeping: bed is flat/ R side down  SABA use:  has rescue inhaler once every 2 weeks with temp change/ no neb use 02: none  LCS  RADS 1 return one year 09/21/12  Rec Trelegy 100  one click each am take 2 good drags The key is to stop smoking completely before smoking completely stops you! Suggested e-cigs as an optional  "one way bridge"  Off all tobacco products   Please schedule a follow up visit in 3 months but call sooner if needed -  PFTs on return  - bring your drug formulary to pick the cheapest option   12/26/2022  f/u ov/[name] re: ***   maint on ***  No chief complaint on file.   Dyspnea:  *** Cough: *** Sleeping: *** SABA use: *** 02: *** Covid status:   *** Lung cancer screening :  ***    No obvious day to day or daytime variability or assoc excess/ purulent sputum or mucus plugs or hemoptysis or cp or chest tightness, subjective wheeze or overt sinus or hb symptoms.   *** without nocturnal  or early am exacerbation  of respiratory  c/o's or need for noct saba. Also denies any obvious fluctuation of symptoms with weather or environmental changes or other aggravating or alleviating factors except as outlined above   No unusual exposure hx or h/o childhood pna/ asthma or knowledge of premature birth.  Current Allergies, Complete Past Medical History, Past Surgical History, Family History, and Social History were reviewed in [employer] record.  ROS  The following are not active complaints unless bolded Hoarseness, sore throat, dysphagia, dental problems, itching, sneezing,  nasal congestion or discharge of excess mucus or purulent secretions, ear ache,   fever, chills, sweats, unintended wt loss or wt gain, classically pleuritic or exertional cp,  orthopnea pnd or arm/hand swelling  or leg swelling, presyncope, palpitations, abdominal pain, anorexia, nausea, vomiting, diarrhea  or change in bowel habits or change in bladder habits, change in stools or change in urine, dysuria, hematuria,  rash, arthralgias, visual complaints, headache, numbness, weakness or ataxia or problems with walking or coordination,  change in mood or  memory.         No outpatient medications have been marked as taking for the 12/26/22 encounter (Appointment) with Nyoka Cowden, MD.             Objective:   Physical Exam  wts   12/26/2022       ***  09/28/2022       248  08/04/22 255 lb 3.2 oz (115.8 kg)  08/01/22 253 lb 1.9 oz (114.8 kg)  07/06/22 257 lb (116.6 kg)    Vital signs reviewed  12/26/2022  - Note at rest 02 sats  ***%  on ***   General appearance:    ***   Min barr ***           Assessment & Plan:

## 2022-12-26 ENCOUNTER — Ambulatory Visit: Payer: Commercial Managed Care - HMO | Admitting: Internal Medicine

## 2022-12-26 ENCOUNTER — Telehealth: Payer: Self-pay

## 2022-12-26 DIAGNOSIS — J449 Chronic obstructive pulmonary disease, unspecified: Secondary | ICD-10-CM

## 2022-12-26 NOTE — Telephone Encounter (Signed)
Patient feels that he is not getting all the medication with the U500 pen. I advised patient that he may be having a malfunction with the pen but he states that it like that will all the pens he gets for this medication. Patient asked was he administering medication correctly and he states yes. Did offer to have him come in so we can make sure he is doing correctly. Would you like to advise anything differently.

## 2022-12-27 ENCOUNTER — Ambulatory Visit: Payer: Commercial Managed Care - HMO | Admitting: Internal Medicine

## 2022-12-27 ENCOUNTER — Ambulatory Visit: Payer: Commercial Managed Care - HMO

## 2022-12-28 NOTE — Progress Notes (Signed)
NEUROLOGY FOLLOW UP OFFICE NOTE  [name] [id number]  Subjective:  [name] is a 67 y.o. 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  To briefly review: Initial consultation 03/31/22: Patient started noticing symptoms 2-2.5 years ago. He relates that his symptoms started having muscle loss, weakness, and tremors. He states prior to this, he was strong. He still tries to stay active, but his muscle loss has slowed him down. He has tingling and numbness in bilateral legs and hands.   He uses a cane to ambulate mostly, but sometimes also uses a walker. He falls often. He mentions that when he is just standing, he tends to get off balance. He falls about once per month. If he is walking, he does not have problems. If he tries to look up or down or turns, that is most likely time to fall. He does occasionally freeze when walking.   Patient mentions he has had a tremor in his left hand since 1998 after his cervical spine fusion. He mentions now that he has tremors all over. His body will jump and almost throw him out of a chair. He mentions that when he is more nervous his tremors worsen.   He relates a long history of neck and back problems. He mentions lower back pain since the age of 23. He also states his cervical spine is "worn out."    The patient denies symptoms suggestive of oculobulbar weakness including diplopia, ptosis, poor saliva control, dysarthria/dysphonia, impaired mastication, facial weakness/droop.    He does sometimes have difficulty getting food down and things will get stuck. Sometimes he has to swallow liquids twice. Per patient, he has something growing in his neck. I do not see notes about this.   Patient has COPD and chronic shortness of breath and has had difficulty  laying on his back due to shortness of breath for about 25 years.   Pseudobulbar affect is absent.   Patient has lost 15-20 pounds over the last 2 years without trying. He has put some weight back on lately.   Patient takes gabapentin 900 mg BID for neuropathy. The pain in his legs can be very severe, especially at night.   Of note, patient is on warfarin for afib.   Patient was on pravastatin but stopped recently due to weakness. He has been off of it for 1-2 weeks. He thinks it may have helped, but is not sure.   Patient was sent to PT. He went once in the summer of 2023. He stopped due to pain.   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 Patient thinks his tremor would go away years ago after drinking. Restrictive diet? No Family history of neuropathy/myopathy/NM disease? Son has tremors  07/06/22: Blood work was significant for low B1. I recommended supplementation with 100 mg daily on 04/11/22. He takes "B vitamin" but is not sure if he is taking B1.   He continues to have a lot a pain. He has pain in neck, back, and legs. The majority of his pain is in his back. He has seen spine surgery in the past and was told there was nothing they could do as his heart doctor would not clear him. I recommended MRI cervical spine at last  visit, but patient has not gotten it. He said he tried to call but no one responded to his calls about this.   He feels weaker and like he may be headed toward a wheelchair. He just got a new shower that will have a seat and bars. He is getting this installed soon.   His tremor is about the same as prior. He was unable to use the primidone as it would make him sleep too much.   He still has difficulty occasionally with swallowing. He has seen ENT in the past and was told he had a "worm like" thing in throat that hangs up his food. He does not have much difficulty with liquids. He has not had a swallow evaluation.  Most recent  Assessment and Plan (07/06/22): This is [name], a 67 y.o. male with:   Numbness, tingling, pain, and weakness - These symptoms are likely multifactorial with contributions from arthritis, cervical and lumbar spine disease, and polyneuropathy. His known risk factors for PN are DM, EtOH abuse, and B1 deficiency. Essential tremor - Patient did not take primidone long because he felt sleepy on it. He is already on gabapentin and propranolol would not be a good option given his other cardiac medications. He is willing to retry primidone and give it more time. Dysphagia - reported by patient. Unclear etiology, but per patient was told his "uvula" was causing problems getting food stuck. Per GI note from 03/01/22, previous EGD in 10/2019 showed no stricture or ring. He did not want barium swallow at that time. He is agreeable to swallow evaluation today.   Plan: -MRI cervical, thoracic, and lumbar spine wo contrast -Swallow evaluation -Physical therapy consult -Continue gabapentin 900 mg BID -Lidocaine cream PRN -B1 (thiamine) 100 mg daily -Can retry primidone 25 mg daily for essential tremor.  Since their last visit: Patient has significant back pain and did not think he could tolerate the MRIs, so he never got them done. He continues to have a lot of back pain. He recently was given a Press photographer, which he is happy about and plans to use.  He has had some episodes of near syncope or syncope. He had his BP medication adjusted that has really helped him with this. Patient has had frequent falls, up to 3-4 times per week. He describes back pain and his right leg giving out. He had a CT head on 10/26/22 that showed no acute process. Patient is not sure if home PT has been ordered.  Swallow evaluation on 08/03/22 was essentially unremarkable. Patient continues to complain of getting food stuck in his throat with a lot of acid reflux and belching.  Patient mentions he and his wife's health is so poor  that they have difficulty doing things around the house, such as cleaning. He mentions difficulty paying bills.  Patient continues to have hand tremors. He has not taken primidone recently. He continues to be on Warfarin.  MEDICATIONS:  Outpatient Encounter Medications as of 01/05/2023  Medication Sig   albuterol (PROVENTIL) (2.5 MG/3ML) 0.083% nebulizer solution Take 3 mLs (2.5 mg total) by nebulization every 6 (six) hours as needed for wheezing or shortness of breath.   albuterol (VENTOLIN HFA) 108 (90 Base) MCG/ACT inhaler INHALE 1-2 PUFFS INTO THE LUNGS EVERY 6 HOURS AS NEEDED FOR WHEEZING OR SHORTNESS OF BREATH   b complex vitamins tablet Take 1 tablet by mouth daily.    bisoprolol (ZEBETA) 10 MG tablet Take 1 tablet (10 mg total) by  mouth daily.   clobetasol cream (TEMOVATE) 0.05 % Apply 1 Application topically 2 (two) times daily.   clotrimazole-betamethasone (LOTRISONE) cream Apply 1 Application topically daily.   colchicine 0.6 MG tablet TAKE 2 TABLETS BY MOUTH AT ONCE. THEN TAKE 1 TABLET 1 HOUR LATER. WAIT 2 DAYS, THEN BEGIN TAKING 1 TABLET DAILY TO PREVENT ACUTE GOUT.   Continuous Blood Gluc Sensor (DEXCOM G7 SENSOR) MISC 3 each by Does not apply route every 30 (thirty) days. Apply 1 sensor every 10 days   Continuous Blood Gluc Transmit (DEXCOM G6 TRANSMITTER) MISC USE AS DIRECTED   Continuous Glucose Receiver (DEXCOM G7 RECEIVER) DEVI See admin instructions.   diltiazem (CARDIZEM CD) 120 MG 24 hr capsule TAKE 1 CAPSULE BY MOUTH DAILY   Dulaglutide (TRULICITY) 3 MG/0.5ML SOPN Inject 3 mg as directed once a week.   famotidine (PEPCID) 20 MG tablet One after supper   febuxostat (ULORIC) 40 MG tablet TAKE 1 TABLET BY MOUTH DAILY   Fluticasone-Umeclidin-Vilant (TRELEGY ELLIPTA) 100-62.5-25 MCG/ACT AEPB Inhale 1 puff into the lungs every morning.   Fluticasone-Umeclidin-Vilant (TRELEGY ELLIPTA) 100-62.5-25 MCG/ACT AEPB One click each am   gabapentin (NEURONTIN) 300 MG capsule TAKE 3  CAPSULES BY MOUTH THREE TIMES DAILY FOR NEUROPATHY   Glucagon 3 MG/DOSE POWD Place 3 mg into the nose once as needed for up to 1 dose.   Insulin Pen Needle (EASY COMFORT PEN NEEDLES) 31G X 5 MM MISC To use with as needed insulin dosing up to four times daily   insulin regular human CONCENTRATED (HUMULIN R U-500 KWIKPEN) 500 UNIT/ML KwikPen Inject 260 Units into the skin daily. Use up to 250-260 units of insulin daily as advised   ketoconazole (NIZORAL) 2 % cream Apply 1 Application topically daily.   losartan (COZAAR) 50 MG tablet TAKE 1 TABLET BY MOUTH DAILY   metolazone (ZAROXOLYN) 2.5 MG tablet TAKE 1 TABLET BY MOUTH ON TUESDAY AND THURSDAY 30 MINUTES BEFORE TAKING TORSEMIDE OR AS DIRECTED   mupirocin ointment (BACTROBAN) 2 % Ply small amount to effected area twice daily for 10 days.   nitroGLYCERIN (NITROSTAT) 0.4 MG SL tablet Place 1 tablet (0.4 mg total) under the tongue every 5 (five) minutes x 3 doses as needed for chest pain.   oxyCODONE-acetaminophen (PERCOCET) 10-325 MG tablet Take 1.5 tablets by mouth in the morning, at noon, in the evening, and at bedtime.   potassium chloride SA (KLOR-CON M) 20 MEQ tablet TAKE 1 TABLET BY MOUTH DAILY. TAKE AN EXTRA TABLET ON TUESDAY AND THURSDAY   predniSONE (DELTASONE) 10 MG tablet 2 daily until breathing/cough better then 1 x 3 days and stop   primidone (MYSOLINE) 50 MG tablet Take 0.5 tablets (25 mg total) by mouth daily.   rosuvastatin (CRESTOR) 10 MG tablet Take 1 tablet (10 mg total) by mouth daily.   tacrolimus (PROTOPIC) 0.1 % ointment Apply topically 2 (two) times daily.   tamsulosin (FLOMAX) 0.4 MG CAPS capsule TAKE 1 CAPSULE BY MOUTH EACH NIGHT AT BEDTIME   torsemide (DEMADEX) 20 MG tablet Take 2 tablets in the am and 1 tablet in the pm   triamcinolone cream (KENALOG) 0.1 % Apply 1 Application topically daily as needed.   VITAMIN A PO Take 2,400 mcg by mouth daily.    Vitamin D, Ergocalciferol, (DRISDOL) 1.25 MG (50000 UNIT) CAPS capsule  Take 1 capsule (50,000 Units total) by mouth every 7 (seven) days.   warfarin (COUMADIN) 5 MG tablet Take 1/2 to 1 tablet daily or as directed  Insulin Glargine (BASAGLAR KWIKPEN) 100 UNIT/ML Inject 30 Units into the skin daily. (Patient not taking: Reported on 01/05/2023)   pantoprazole (PROTONIX) 40 MG tablet Take 1 tablet (40 mg total) by mouth daily. Take 30-60 min before first meal of the day (Patient not taking: Reported on 01/05/2023)   sulfamethoxazole-trimethoprim (BACTRIM DS) 800-160 MG tablet Take 1 tablet by mouth 2 (two) times daily. (Patient not taking: Reported on 01/05/2023)   No facility-administered encounter medications on file as of 01/05/2023.    PAST MEDICAL HISTORY: Past Medical History:  Diagnosis Date   Actinic keratosis    Arthritis    lower back   Asthma    Atrial fibrillation [facility])    Atrial flutter (HCC)    s/p CTI ablation by Dr Johney Frame   Brain aneurysm 2009   questionable. A follow up CTA in 2009 showed no evidence of   Chronic back pain    DDD/stenosis   Colon polyps    9 polyps removed 10/13/11   Complication of anesthesia 09/11/2012   slow to awaken after ablation   COPD (chronic obstructive pulmonary disease) (HCC)    Diabetes mellitus    takes Metformin and Glimepiride daily   Emphysema    GERD (gastroesophageal reflux disease)    takes Omeprazole bid   Heart failure (HCC)    History of shingles    HLD (hyperlipidemia)    takes Pravastatin daily   HTN (hypertension)    takes Prinizide daily   Obesity    OSA (obstructive sleep apnea)    not always using cpap   Overdose 2009   unintentional Flecanide overdose   Peripheral neuropathy    Short-term memory loss    Tobacco abuse     PAST SURGICAL HISTORY: Past Surgical History:  Procedure Laterality Date   ANTERIOR CERVICAL DECOMP/DISCECTOMY FUSION  01/04/2012   Procedure: ANTERIOR CERVICAL DECOMPRESSION/DISCECTOMY FUSION 1 LEVEL/HARDWARE REMOVAL;  Surgeon: Cristi Loron, MD;  Location:  [facility] NEURO ORS;  Service: Neurosurgery;  Laterality: N/A;  explore cervical fusion Cervical six - seven  with removal of codman plate anterior cervical decompression with fusion interbody prothesis plating and bonegraft   APPENDECTOMY  2-39yrs ago   ATRIAL FIBRILLATION ABLATION N/A 09/11/2012   PT DID NOT HAVE AN ATRIAL FIBRILLATION ABLATION IN 2014!  ATRIAL FLUTTER ABLATION ONLY   ATRIAL FLUTTER ABLATION  09/11/2012   CTI ablation by Dr Johney Frame   BIOPSY  11/07/2019   Procedure: BIOPSY;  Surgeon: Rachael Fee, MD;  Location: [facility] ENDOSCOPY;  Service: Endoscopy;;   CARDIAC CATHETERIZATION  2008   no significant CAD   CARDIOVERSION  05/05/2011   Procedure: CARDIOVERSION;  Surgeon: Lewayne Bunting, MD;  Location: [facility] ENDOSCOPY;  Service: Cardiovascular;  Laterality: N/A;   CARDIOVERSION Bilateral 07/26/2012   Procedure: CARDIOVERSION;  Surgeon: Rollene Rotunda, MD;  Location: [facility] ENDOSCOPY;  Service: Cardiovascular;  Laterality: Bilateral;   CARPAL TUNNEL RELEASE  99/2000   bilateral   COLONOSCOPY WITH PROPOFOL N/A 12/13/2012   Procedure: COLONOSCOPY WITH PROPOFOL;  Surgeon: Rachael Fee, MD;  Location: [facility] ENDOSCOPY;  Service: Endoscopy;  Laterality: N/A;   ELECTROPHYSIOLOGIC STUDY N/A 04/21/2015   Procedure: Atrial Fibrillation Ablation;  Surgeon: Hillis Range, MD;  Location: [facility] INVASIVE CV LAB;  Service: Cardiovascular;  Laterality: N/A;   ESOPHAGOGASTRODUODENOSCOPY (EGD) WITH PROPOFOL N/A 11/07/2019   Procedure: ESOPHAGOGASTRODUODENOSCOPY (EGD) WITH PROPOFOL;  Surgeon: Rachael Fee, MD;  Location: [facility] ENDOSCOPY;  Service: Endoscopy;  Laterality: N/A;   HERNIA REPAIR  KNEE ARTHROSCOPY WITH MEDIAL MENISECTOMY Left 04/23/2021   Procedure: LEFT KNEE ARTHROSCOPY WITH PARTIAL MEDIAL MENISCECTOMY;  Surgeon: Eldred Manges, MD;  Location: [facility] OR;  Service: Orthopedics;  Laterality: Left;   KNEE SURGERY  6-26yrs ago   left   LEFT HEART CATH AND CORONARY ANGIOGRAPHY N/A 09/19/2016   Procedure: Left Heart  Cath and Coronary Angiography;  Surgeon: Lyn Records, MD;  Location: [facility] INVASIVE CV LAB;  Service: Cardiovascular;  Laterality: N/A;   Left inguinal hernia repair     as a child   NASAL SEPTOPLASTY W/ TURBINOPLASTY Bilateral 02/19/2014   Procedure: NASAL SEPTOPLASTY WITH BILATERAL TURBINATE REDUCTION;  Surgeon: Flo Shanks, MD;  Location: [facility] OR;  Service: ENT;  Laterality: Bilateral;   NECK SURGERY  8yrs ago   right shoulder surgery  4-50yrs ago   cyst removed   TEE WITHOUT CARDIOVERSION  05/05/2011   Procedure: TRANSESOPHAGEAL ECHOCARDIOGRAM (TEE);  Surgeon: Lewayne Bunting, MD;  Location: [facility] ENDOSCOPY;  Service: Cardiovascular;  Laterality: N/A;   TEE WITHOUT CARDIOVERSION N/A 04/21/2015   Procedure: TRANSESOPHAGEAL ECHOCARDIOGRAM (TEE);  Surgeon: Quintella Reichert, MD;  Location: [facility] ENDOSCOPY;  Service: Cardiovascular;  Laterality: N/A;   THROAT SURGERY  4-53yrs ago   "thought " it was cancer but came back not   TONSILLECTOMY      ALLERGIES: Allergies  Allergen Reactions   Adhesive [Tape]     itching   Latex Itching    When tape is on the skin too long skin gets red & itching    FAMILY HISTORY: Family History  Problem Relation Age of Onset   Diabetes Mother    Heart disease Father    Lung cancer Father    Diabetes Maternal Grandmother    Colon cancer Neg Hx    Anesthesia problems Neg Hx    Hypotension Neg Hx    Malignant hyperthermia Neg Hx    Pseudochol deficiency Neg Hx    Esophageal cancer Neg Hx    Pancreatic cancer Neg Hx    Stomach cancer Neg Hx     SOCIAL HISTORY: Social History   Tobacco Use   Smoking status: Every Day    Current packs/day: 1.00    Average packs/day: 1 pack/day for 49.0 years (49.0 ttl pk-yrs)    Types: Cigarettes, E-cigarettes   Smokeless tobacco: Never   Tobacco comments:    Smoking almost a pack per day Patient is vaping daily.  09/28/2022 am a pack of cigs in 4days 01/05/23  Vaping Use   Vaping status: Every Day  Substance Use  Topics   Alcohol use: Not Currently    Alcohol/week: 0.0 standard drinks of alcohol    Comment: 12/05/17 pt stated he has drinked 1/2 drink in 3 years   Drug use: No   Social History   Social History Narrative   Daily caffeine(Mountain Dew) Lives in [place] with spouse.Unemployed due to chronic back/ leg pain         Are you right handed or left handed? Left Handed    Are you currently employed ? No    What is your current occupation? No   Do you live at home alone? no   Who lives with you? Lives with wife and 3 doggies   What type of home do you live in: 1 story or 2 story? Lives in one story home          Objective:  Vital Signs:  BP 139/66   Pulse 82  Ht 6\' 2"  (1.88 m)   Wt 236 lb (107 kg)   SpO2 96%   BMI 30.30 kg/m   General: General appearance: Awake and alert. No distress. Cooperative with exam.  Skin: No obvious rash or jaundice. HEENT: Atraumatic. Anicteric. Lungs: Dyspnea with exertion  Neurological: Mental Status: Alert. Speech fluent. No pseudobulbar affect Cranial Nerves: CNII: No RAPD. Visual fields intact. CNIII, IV, VI: PERRL. No nystagmus. EOMI. CN V: Facial sensation intact bilaterally to fine touch. CN VII: Facial muscles symmetric and strong. No ptosis at rest. CN VIII: Hears finger rub well bilaterally. CN IX: No hypophonia. CN X: Palate elevates symmetrically. CN XI: Full strength shoulder shrug bilaterally. CN XII: Tongue protrusion full and midline. No atrophy or fasciculations. No significant dysarthria Motor: Tone is increased in all extremities. Bilateral hand tremor (L > R).  Individual muscle group testing (MRC grade out of 5):  Movement     Neck flexion 5    Neck extension 5     Right Left   Shoulder abduction 5 5   Elbow flexion 5 5   Elbow extension 5 5   Finger abduction - FDI 5- 5-   Finger abduction - ADM 5- 5-   Finger extension 5 5   Finger flexion 5 5    Hip flexion 4+ 4+   Hip extension 5 5   Hip  adduction 5 5   Hip abduction 5 5   Knee extension 5 5   Knee flexion 5 5   Dorsiflexion 5- 5-   Plantarflexion 5 5    Reflexes: trace throughout Sensation: Pinprick: Absent in lower extremities to the knee Vibration: Absent in bilateral feet and diminished in hands Proprioception: Absent in bilateral great toes Coordination: Intact finger-to- nose-finger bilaterally. Intention tremor as above Gait: Stooped posture. Antalgic gait.  Labs and Imaging review: New results: TSH (08/01/22) wnl  B12 (external - 12/22/22): 917 HbA1c (external - 12/22/22): 7.4 Lipid panel (external - 12/22/22): Total cholesterol 95, TG 286, LDL 37 CMP (external - 12/22/22): significant for K 3.2, glucose 155, Cr 1.54  CT head wo contrast (10/26/22): FINDINGS: Brain: No evidence of acute infarction, hemorrhage, hydrocephalus, extra-axial collection or mass lesion/mass effect.   Vascular: No hyperdense vessel or unexpected calcification.   Skull: Normal. Negative for fracture or focal lesion.   Sinuses/Orbits: No acute finding.   Other: None.   IMPRESSION: No acute intracranial abnormality noted.  Swallow evaluation (08/03/22): Clinical Impression: Clinical Impression: Patient presents with a very mild oropharyngeal dysphagia resulting only in bolus residuals of mechanical soft solids remaining on tongue base after initial swallow. Patient does have sensation to this and was able to clear with sip of thin liquid barium. No penetration, aspiration or pharyngeal residuals observed with any of the tested barium consistencies (nectar and honey thick, thin, 13 mm barium tablet, mechanical soft solid). Appearance of bulging of posterior pharynx observed which did not appear to significantly impede bolus transit. Patient able to swallow 13mm barium tablet without any liquid. Esophageal sweep did not reveal any barium stasis and tablet transited without difficulty. SLP recommended patient alternate bites of solids with  sips of liquids and to avoid foods that are difficult to masticate (he has no teeth and no dentures) and avoid foods that are very dry. (he complains mostly of difficulty with foods such as breads) No further SLP intervention or evaluation warranted at this time.  Previously reviewed results: 03/31/22: IFE and SPEP with no M protein CK: 31 B1: <  6   HbA1c (06/28/22): 7.6  Normal or unremarkable: CRP, B12 (841), folate, RF, TSH ANA negative on 08/12/21, positive on 10/22/20, negative on 12/26/17 INR (03/24/22): 2.2 CBC (03/01/22): significant for anemia (Hb 11.2), thrombocytopenia (141) CMP (03/01/22): significant for elevated glucose, elevated alk phos (121), Cr 1.5 ESR (03/01/22): elevated to 67 HbA1c (11/30/21): 7.7   EMG (08/25/20, Dr. Alvester Morin): EMG & NCV Findings: Evaluation of the left fibular motor nerve showed prolonged distal onset latency (12.2 ms) and reduced amplitude (1.9 mV).  The left tibial motor nerve showed prolonged distal onset latency (6.2 ms), reduced amplitude (0.6 mV), and decreased conduction velocity (Knee-Ankle, 31 m/s).  The left saphenous sensory nerve showed prolonged distal peak latency (8.1 ms).  The left superficial fibular sensory nerve showed no response (14 cm).  The left sural sensory nerve showed no response (Calf).     Needle evaluation of the left anterior tibialis and the left Fibularis Longus muscles showed increased insertional activity, increased spontaneous activity, and diminished recruitment.  The left medial gastrocnemius muscle showed increased insertional activity, moderately increased spontaneous activity, and diminished recruitment.  All remaining muscles (as indicated in the following table) showed no evidence of electrical instability.     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  He  carries a diagnosis of polyneuropathy but has not had electrodiagnostic studies.  For proven full characterization of the neuropathy referral to a neurologist for a full 3 limb study could be performed.  The needle EMG findings could be consistent with a lumbar stenosis but typically the level of denervation seen in motor unit changes would be a fairly severe stenosis and his latest imaging shows moderate narrowing.  He could be getting more pain from the spine on the left more than right but that is not confirmed or elucidated with this testing.   There is no significant electrodiagnostic evidence of any other focal nerve entrapment or lumbosacral plexopathy.    MRI lumbar spine wo contrast (03/01/20): FINDINGS: Segmentation:  Standard.   Alignment:  Straightening of lordosis.  Grade 1 L3-4 retrolisthesis.   Vertebrae: Multilevel Modic type 2 endplate degenerative changes. Multilevel Schmorl's node formation. Scattered hemangiomata versus focal fat.   Conus medullaris and cauda equina: Conus extends to the L1 level. Conus and cauda equina appear normal.   Disc levels: Multilevel desiccation disc space loss most prominent at the L3-4 and L5-S1 levels.   L1-2: Disc bulge with small central protrusion, ligamentum flavum and bilateral facet hypertrophy. Mild spinal canal and bilateral neural foraminal narrowing.   L2-3: Disc bulge, ligamentum flavum and bilateral facet hypertrophy. Mild spinal canal, moderate right and mild left neural foraminal narrowing.   L3-4: Disc bulge abutting the ventral thecal sac with prominent subarticular components effacing the lateral recess. There is abutment of the exiting L3 and descending L4 nerve roots. Bilateral facet hypertrophy. Moderate spinal canal and bilateral neural foraminal narrowing, unchanged.   L4-5: Disc bulge and bilateral facet hypertrophy. Small left foraminal protrusion grazing the exiting L4 nerve root. Patent spinal canal.  Moderate bilateral neural foraminal narrowing.   L5-S1: Bilateral pars defects. Disc bulge with superimposed central protrusion/annular fissuring. Bilateral facet hypertrophy. Patent spinal canal. Mild bilateral neural foraminal narrowing.   Paraspinal and other soft tissues: Bilateral renal cysts.   IMPRESSION: Multilevel spondylosis, grossly unchanged.   Moderate L3-4 and mild L1-3 spinal canal narrowing.   Moderate right  L2-3 and bilateral L3-5 neural foraminal narrowing.   Mild bilateral L1-2, L5-S1 neural foraminal narrowing.   MRI cervical spine (10/31/11): Findings: Scan extends from the mid clivus through T1-2. Normal  paraspinal soft tissues. Visualized intracranial contents are  normal.   The patient has a solid anterior cervical fusion from C4-C6.  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  The cervical spinal cord  is otherwise normal.   C1-2 and C2-3:  Normal.   C3-4:  Small slightly more prominent broad-based disc bulge with  prominent uncinate spurring to the right and left with bilateral  foraminal narrowing, unchanged.   C4-5 and C5-6:  Solid anterior fusion with no residual impingement.  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   C6-7:  Large new soft disc protrusion and extrusion into the left  lateral recess and left neural foramen affecting the left C7 nerve.  The extruded material measures 12 x 11 x 9 mm.  It touches the left  side of the spinal cord in the left lateral recess but does not  compress it.   C7-T1 and T1-2:  Normal.   IMPRESSION:  1. Large soft disc extrusion and protrusion into the left neural  foramen and left lateral recess at C6-7 compressing the left C7  nerve.  2. 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  3.  Slight increased broad-based disc bulge at C3-4 without focal  neural impingement.   Assessment/Plan:  This is  PAISLEY MARCIN, a 67 y.o. male with: Numbness, tingling, pain, and weakness - These symptoms are likely multifactorial with contributions from arthritis, cervical and lumbar spine disease, and polyneuropathy. His known risk factors for PN are DM, EtOH abuse, and B1 deficiency. Essential tremor - Patient has not taken primidone as he is not sure if he has the medication. He is already on gabapentin and propranolol would not be a good option given his other cardiac medications. Primidone could interact with Warfarin, but patient is monitored at Coumadin clinic and has been on primidone in the past. He is willing to retry primidone at a low dose to see if it helps with tremor.  Plan: -Home health care: PT, home health aide would both benefit patient -Primidone 25 mg daily for essential tremor. Patient to follow closely with Coumadin clinic to make sure it stays therapeutic. -Continue gabapentin 900 mg TID -Fall precautions discussed -Discussed foot care and checking his feet daily -Continue B1 supplementation  Return to clinic in 6 months or sooner if needed  Total time spent reviewing records, interview, history/exam, documentation, and coordination of care on day of encounter:  30 min  Jacquelyne Balint, MD

## 2022-12-29 ENCOUNTER — Telehealth: Payer: Self-pay | Admitting: Internal Medicine

## 2022-12-29 NOTE — Telephone Encounter (Signed)
Patient is wanting a refill on heartburn medication. He is willing to have a phone call at [phone].

## 2022-12-29 NOTE — Telephone Encounter (Signed)
Nothing else to offer but this is not a pulmonary problem he should refer to his GI doc or PCP whichever is applicable (he missed his last appt and needs to reschedule for the pulmonary parts of his problem - be sure to bring all meds)

## 2022-12-29 NOTE — Telephone Encounter (Signed)
Patient in office with wife who is seeing Dr. Sherene Sires.  Mr. Swecker states he is having severe heartburn and reflux.  His food is coming back up into his throat and he is having a lot of acid and burning from the back of his throat to mid chest area.  This has gotten worse over the last few months.  Patient is taking pantoprazole 40 mg one tablet before breakfast as directed and famotidine 20 mg one tablet after supper.  He states these medications are not helping with his GERD.  Mr. Brena  would like Dr. Sherene Sires to call in a different medication(s).  Dr. Sherene Sires, please advise.

## 2023-01-02 ENCOUNTER — Other Ambulatory Visit: Payer: Self-pay | Admitting: Nurse Practitioner

## 2023-01-02 DIAGNOSIS — I2510 Atherosclerotic heart disease of native coronary artery without angina pectoris: Secondary | ICD-10-CM

## 2023-01-04 DIAGNOSIS — H524 Presbyopia: Secondary | ICD-10-CM | POA: Diagnosis not present

## 2023-01-04 DIAGNOSIS — H16143 Punctate keratitis, bilateral: Secondary | ICD-10-CM | POA: Diagnosis not present

## 2023-01-04 DIAGNOSIS — H52223 Regular astigmatism, bilateral: Secondary | ICD-10-CM | POA: Diagnosis not present

## 2023-01-05 ENCOUNTER — Encounter (HOSPITAL_BASED_OUTPATIENT_CLINIC_OR_DEPARTMENT_OTHER): Payer: Self-pay

## 2023-01-05 ENCOUNTER — Ambulatory Visit (INDEPENDENT_AMBULATORY_CARE_PROVIDER_SITE_OTHER): Payer: Medicare Other | Admitting: Neurology

## 2023-01-05 ENCOUNTER — Ambulatory Visit: Payer: Medicare Other

## 2023-01-05 ENCOUNTER — Encounter: Payer: Self-pay | Admitting: Neurology

## 2023-01-05 VITALS — BP 139/66 | HR 82 | Ht 74.0 in | Wt 236.0 lb

## 2023-01-05 DIAGNOSIS — R531 Weakness: Secondary | ICD-10-CM

## 2023-01-05 DIAGNOSIS — E519 Thiamine deficiency, unspecified: Secondary | ICD-10-CM

## 2023-01-05 DIAGNOSIS — R2689 Other abnormalities of gait and mobility: Secondary | ICD-10-CM | POA: Diagnosis not present

## 2023-01-05 DIAGNOSIS — R1314 Dysphagia, pharyngoesophageal phase: Secondary | ICD-10-CM

## 2023-01-05 DIAGNOSIS — F1011 Alcohol abuse, in remission: Secondary | ICD-10-CM

## 2023-01-05 DIAGNOSIS — R251 Tremor, unspecified: Secondary | ICD-10-CM

## 2023-01-05 DIAGNOSIS — G250 Essential tremor: Secondary | ICD-10-CM

## 2023-01-05 DIAGNOSIS — G629 Polyneuropathy, unspecified: Secondary | ICD-10-CM

## 2023-01-05 DIAGNOSIS — M48062 Spinal stenosis, lumbar region with neurogenic claudication: Secondary | ICD-10-CM | POA: Diagnosis not present

## 2023-01-05 DIAGNOSIS — R269 Unspecified abnormalities of gait and mobility: Secondary | ICD-10-CM

## 2023-01-05 DIAGNOSIS — R52 Pain, unspecified: Secondary | ICD-10-CM

## 2023-01-05 DIAGNOSIS — M6259 Muscle wasting and atrophy, not elsewhere classified, multiple sites: Secondary | ICD-10-CM | POA: Diagnosis not present

## 2023-01-05 DIAGNOSIS — M542 Cervicalgia: Secondary | ICD-10-CM

## 2023-01-05 DIAGNOSIS — M4802 Spinal stenosis, cervical region: Secondary | ICD-10-CM

## 2023-01-05 MED ORDER — PRIMIDONE 50 MG PO TABS
25.0000 mg | ORAL_TABLET | Freq: Every day | ORAL | 3 refills | Status: DC
Start: 2023-01-05 — End: 2023-07-17

## 2023-01-05 NOTE — Patient Instructions (Signed)
I will order home health care today to get you some help at home and therapy.  We will try primidone again for your tremor. Take 1/2 pill every day (25 mg). This could affect your Warfarin, so follow up with the Coumadin clinic closely.  Continue gabapentin 900 mg three times daily.  Check your feet every day for cuts or infections. Call your doctor or go to the emergency room if you find problems.  Preventing Falls at [facility] are common, often dreaded events in the lives of older people. Aside from the obvious injuries and even death that may result, fall can cause wide-ranging consequences including loss of independence, mental decline, decreased activity and mobility. Younger people are also at risk of falling, especially those with chronic illnesses and fatigue.  Ways to reduce risk for falling Examine diet and medications. Warm foods and alcohol dilate blood vessels, which can lead to dizziness when standing. Sleep aids, antidepressants and pain medications can also increase the likelihood of a fall.  Get a vision exam. Poor vision, cataracts and glaucoma increase the chances of falling.  Check foot gear. Shoes should fit snugly and have a sturdy, nonskid sole and a broad, low heel  Participate in a physician-approved exercise program to build and maintain muscle strength and improve balance and coordination. Programs that use ankle weights or stretch bands are excellent for muscle-strengthening. Water aerobics programs and low-impact Tai Chi programs have also been shown to improve balance and coordination.  Increase vitamin D intake. Vitamin D improves muscle strength and increases the amount of calcium the body is able to absorb and deposit in bones.  How to prevent falls from common hazards Floors - Remove all loose wires, cords, and throw rugs. Minimize clutter. Make sure rugs are anchored and smooth. Keep furniture in its usual place.  Chairs -- Use chairs with straight  backs, armrests and firm seats. Add firm cushions to existing pieces to add height.  Bathroom - Install grab bars and non-skid tape in the tub or shower. Use a bathtub transfer bench or a shower chair with a back support Use an elevated toilet seat and/or safety rails to assist standing from a low surface. Do not use towel racks or bathroom tissue holders to help you stand.  Lighting - Make sure halls, stairways, and entrances are well-lit. Install a night light in your bathroom or hallway. Make sure there is a light switch at the top and bottom of the staircase. Turn lights on if you get up in the middle of the night. Make sure lamps or light switches are within reach of the bed if you have to get up during the night.  Kitchen - Install non-skid rubber mats near the sink and stove. Clean spills immediately. Store frequently used utensils, pots, pans between waist and eye level. This helps prevent reaching and bending. Sit when getting things out of lower cupboards.  Living room/ Bedrooms - Place furniture with wide spaces in between, giving enough room to move around. Establish a route through the living room that gives you something to hold onto as you walk.  Stairs - Make sure treads, rails, and rugs are secure. Install a rail on both sides of the stairs. If stairs are a threat, it might be helpful to arrange most of your activities on the lower level to reduce the number of times you must climb the stairs.  Entrances and doorways - Install metal handles on the walls adjacent to the doorknobs  of all doors to make it more secure as you travel through the doorway.  Tips for maintaining balance Keep at least one hand free at all times. Try using a backpack or fanny pack to hold things rather than carrying them in your hands. Never carry objects in both hands when walking as this interferes with keeping your balance.  Attempt to swing both arms from front to back while walking. This might require a  conscious effort if Parkinson's disease has diminished your movement. It will, however, help you to maintain balance and posture, and reduce fatigue.  Consciously lift your feet off of the ground when walking. Shuffling and dragging of the feet is a common culprit in losing your balance.  When trying to navigate turns, use a "U" technique of facing forward and making a wide turn, rather than pivoting sharply.  Try to stand with your feet shoulder-length apart. When your feet are close together for any length of time, you increase your risk of losing your balance and falling.  Do one thing at a time. Don't try to walk and accomplish another task, such as reading or looking around. The decrease in your automatic reflexes complicates motor function, so the less distraction, the better.  Do not wear rubber or gripping soled shoes, they might "catch" on the floor and cause tripping.  Move slowly when changing positions. Use deliberate, concentrated movements and, if needed, use a grab bar or walking aid. Count 15 seconds between each movement. For example, when rising from a seated position, wait 15 seconds after standing to begin walking.  If balance is a continuous problem, you might want to consider a walking aid such as a cane, walking stick, or walker. Once you've mastered walking with help, you might be ready to try it on your own again.  The physicians and staff at [facility] are committed to providing excellent care. You may receive a survey requesting feedback about your experience at our office. We strive to receive "very good" responses to the survey questions. If you feel that your experience would prevent you from giving the office a "very good " response, please contact our office to try to remedy the situation. We may be reached at [phone]. Thank you for taking the time out of your busy day to complete the survey.  Jacquelyne Balint, MD [facility]

## 2023-01-09 ENCOUNTER — Telehealth: Payer: Self-pay

## 2023-01-09 ENCOUNTER — Ambulatory Visit: Payer: Medicare Other | Admitting: Internal Medicine

## 2023-01-09 NOTE — Telephone Encounter (Signed)
Have called around to find help for this patient for light house work due to Husband and Wife disability. Am waiting for a call back from [employer].   [facility] [employer] Address : [address] [place] : [place] : [place] Zip : [address] Website : [email or web address] Email : [email or web address] Information Phone : [phone] Languages : English Description : [employer] is responsible for planning, developing, implementing, and coordinating aging services for a region representing residents age 44 plus in the counties of the [place] ([place], [place], [place], [place], [place], [place], [place], [place], [place], [place], [place], [place]). Hours : 8:30 AM - 5:00 PM, EST Monday to Friday.

## 2023-01-09 NOTE — Progress Notes (Deleted)
Patient ID: DONAVYN BENNINK, male   DOB: [birth year], 67 y.o.   MRN: [id number]  HPI: TRUE COWDIN is a 67 y.o.-year-old male, returning for follow-up for DM2, dx in 2012, insulin-dependent since 2019, uncontrolled, with complications (CHF, CAD, Afib/flutter, PAD, CKD, PN). Pt. previously saw Dr. Everardo All, but last visit with me 1.5 months ago.  Interim history: No increased urination, nausea, chest pain.   He has muscle and joint pain.  She sees the pain clinic.   He also has weakness and walks with a cane - feels this is related to the Covid vaccines. He is not sleeping well at night.  Reviewed HbA1c: 12/22/2022: HbA1c 7.4% ([facility]) 11/16/2022: HbA1c 7.8% Lab Results  Component Value Date   HGBA1C 7.6 (A) 06/28/2022   HGBA1C 7.6 (A) 05/11/2022   HGBA1C 7.7 (A) 11/30/2021   HGBA1C 7.6 (A) 07/29/2021   HGBA1C 8.1 (A) 05/26/2021   HGBA1C 7.6 (A) 11/09/2020   HGBA1C 7.6 (A) 08/20/2020   HGBA1C 8.1 (A) 06/17/2020   HGBA1C 8.7 (A) 04/13/2020   HGBA1C 8.4 (A) 11/21/2019   Previously on: - Trulicity 3 mg weekly - Humalog 0-60 units 1-5x a day, before meals - max amount 270 units/day (!) Per review of Dr. George Hugh notes, time-released insulins caused a rash in the past.  Patient does not remember this at today's visit. He tried [place] but this caused dehydration. Metformin dose is limited by abdominal pain. Trulicity dose is limited by nausea. In the past, he has been missing insulin 2 to 3 days at that time per review of Dr. George Hugh notes.  Then on: - Trulicity 3 mg weekly - NovoLog >> Aspart 40-50 units 3x a day, before meals - Basaglar 70 >> 75 units at bedtime  At last visit he was on: - Trulicity 3 mg weekly - U500 insulin - misses doses, takes it mostly after the meals, or misses 120-140 >> actually taking 60-80  units of insulin in the morning   70-100 >> 90-110 >> actually taking 40-60 units in the evening  I advised him to change to: - Trulicity 3 mg weekly - U500  insulin - 30 min before meals 60-80 units in the morning  40-60 units in the evening - Basaglar 30 units daily at bedtime  Pt checks his sugars >4x a day with his Dexcom CGM:  Previously:   Previously:   Lowest sugar was 60s >> 120 >> 70s-80s >> 50s; he has hypoglycemia awareness at 110.  Highest sugar was 600 >> 300s >> 200s >> 300s  Glucometer: ReliOn  Pt's meals are: - Breakfast: sausage biscuit - Lunch: banana sandwich, leftovers - Dinner: meat and veggies, seldom cake - Snacks: occas. - pasta, etc.  - + CKD, last BUN/creatinine:   Lab Results  Component Value Date   BUN 28 (H) 08/01/2022   BUN 22 03/01/2022   CREATININE 1.25 08/01/2022   CREATININE 1.50 03/01/2022   Lab Results  Component Value Date   MICRALBCREAT >300 08/01/2022  On diltiazem, Cozaar 50 mg daily.  -+ HL; last set of lipids:  Lab Results  Component Value Date   CHOL 154 08/01/2022   HDL 23 (L) 08/01/2022   LDLCALC 63 08/01/2022   LDLDIRECT 99.9 09/08/2011   TRIG 437 (H) 08/01/2022   CHOLHDL 6.7 (H) 08/01/2022  Off pravastatin 80 mg daily - stopped 2/2 mm weakness.  He was started on Crestor 10 mg daily 10/2022.  - last eye exam was in 2024. Reportedly No DR. Incipient  cataracts.  - + numbness and tingling in his feet. Also, significant pain. On Neurontin 300 mg 3 capsules twice daily. He sees pain management. He sees podiatry -last foot exam by Dr. Logan Bores: 11/25/2022.  He also has a history of HTN, OSA, COPD, GERD, DDD, gout. He is still smoking.  ROS: + see HPI  Past Medical History:  Diagnosis Date   Actinic keratosis    Arthritis    lower back   Asthma    Atrial fibrillation [facility])    Atrial flutter (HCC)    s/p CTI ablation by Dr Johney Frame   Brain aneurysm 2009   questionable. A follow up CTA in 2009 showed no evidence of   Chronic back pain    DDD/stenosis   Colon polyps    9 polyps removed 10/13/11   Complication of anesthesia 09/11/2012   slow to awaken after ablation    COPD (chronic obstructive pulmonary disease) (HCC)    Diabetes mellitus    takes Metformin and Glimepiride daily   Emphysema    GERD (gastroesophageal reflux disease)    takes Omeprazole bid   Heart failure (HCC)    History of shingles    HLD (hyperlipidemia)    takes Pravastatin daily   HTN (hypertension)    takes Prinizide daily   Obesity    OSA (obstructive sleep apnea)    not always using cpap   Overdose 2009   unintentional Flecanide overdose   Peripheral neuropathy    Short-term memory loss    Tobacco abuse    Past Surgical History:  Procedure Laterality Date   ANTERIOR CERVICAL DECOMP/DISCECTOMY FUSION  01/04/2012   Procedure: ANTERIOR CERVICAL DECOMPRESSION/DISCECTOMY FUSION 1 LEVEL/HARDWARE REMOVAL;  Surgeon: Cristi Loron, MD;  Location: [facility] NEURO ORS;  Service: Neurosurgery;  Laterality: N/A;  explore cervical fusion Cervical six - seven  with removal of codman plate anterior cervical decompression with fusion interbody prothesis plating and bonegraft   APPENDECTOMY  2-50yrs ago   ATRIAL FIBRILLATION ABLATION N/A 09/11/2012   PT DID NOT HAVE AN ATRIAL FIBRILLATION ABLATION IN 2014!  ATRIAL FLUTTER ABLATION ONLY   ATRIAL FLUTTER ABLATION  09/11/2012   CTI ablation by Dr Johney Frame   BIOPSY  11/07/2019   Procedure: BIOPSY;  Surgeon: Rachael Fee, MD;  Location: [facility] ENDOSCOPY;  Service: Endoscopy;;   CARDIAC CATHETERIZATION  2008   no significant CAD   CARDIOVERSION  05/05/2011   Procedure: CARDIOVERSION;  Surgeon: Lewayne Bunting, MD;  Location: [facility] ENDOSCOPY;  Service: Cardiovascular;  Laterality: N/A;   CARDIOVERSION Bilateral 07/26/2012   Procedure: CARDIOVERSION;  Surgeon: Rollene Rotunda, MD;  Location: [facility] ENDOSCOPY;  Service: Cardiovascular;  Laterality: Bilateral;   CARPAL TUNNEL RELEASE  99/2000   bilateral   COLONOSCOPY WITH PROPOFOL N/A 12/13/2012   Procedure: COLONOSCOPY WITH PROPOFOL;  Surgeon: Rachael Fee, MD;  Location: [facility] ENDOSCOPY;  Service:  Endoscopy;  Laterality: N/A;   ELECTROPHYSIOLOGIC STUDY N/A 04/21/2015   Procedure: Atrial Fibrillation Ablation;  Surgeon: Hillis Range, MD;  Location: [facility] INVASIVE CV LAB;  Service: Cardiovascular;  Laterality: N/A;   ESOPHAGOGASTRODUODENOSCOPY (EGD) WITH PROPOFOL N/A 11/07/2019   Procedure: ESOPHAGOGASTRODUODENOSCOPY (EGD) WITH PROPOFOL;  Surgeon: Rachael Fee, MD;  Location: [facility] ENDOSCOPY;  Service: Endoscopy;  Laterality: N/A;   HERNIA REPAIR     KNEE ARTHROSCOPY WITH MEDIAL MENISECTOMY Left 04/23/2021   Procedure: LEFT KNEE ARTHROSCOPY WITH PARTIAL MEDIAL MENISCECTOMY;  Surgeon: Eldred Manges, MD;  Location: [facility] OR;  Service: Orthopedics;  Laterality: Left;  KNEE SURGERY  6-52yrs ago   left   LEFT HEART CATH AND CORONARY ANGIOGRAPHY N/A 09/19/2016   Procedure: Left Heart Cath and Coronary Angiography;  Surgeon: Lyn Records, MD;  Location: [facility] INVASIVE CV LAB;  Service: Cardiovascular;  Laterality: N/A;   Left inguinal hernia repair     as a child   NASAL SEPTOPLASTY W/ TURBINOPLASTY Bilateral 02/19/2014   Procedure: NASAL SEPTOPLASTY WITH BILATERAL TURBINATE REDUCTION;  Surgeon: Flo Shanks, MD;  Location: [facility] OR;  Service: ENT;  Laterality: Bilateral;   NECK SURGERY  30yrs ago   right shoulder surgery  4-75yrs ago   cyst removed   TEE WITHOUT CARDIOVERSION  05/05/2011   Procedure: TRANSESOPHAGEAL ECHOCARDIOGRAM (TEE);  Surgeon: Lewayne Bunting, MD;  Location: [facility] ENDOSCOPY;  Service: Cardiovascular;  Laterality: N/A;   TEE WITHOUT CARDIOVERSION N/A 04/21/2015   Procedure: TRANSESOPHAGEAL ECHOCARDIOGRAM (TEE);  Surgeon: Quintella Reichert, MD;  Location: [facility] ENDOSCOPY;  Service: Cardiovascular;  Laterality: N/A;   THROAT SURGERY  4-62yrs ago   "thought " it was cancer but came back not   TONSILLECTOMY     Social History   Socioeconomic History   Marital status: Married    Spouse name: Not on file   Number of children: 2   Years of education: Not on file   Highest education level: Not on  file  Occupational History   Occupation: Curator   Tobacco Use   Smoking status: Every Day    Current packs/day: 1.00    Average packs/day: 1 pack/day for 49.0 years (49.0 ttl pk-yrs)    Types: Cigarettes, E-cigarettes   Smokeless tobacco: Never   Tobacco comments:    Smoking almost a pack per day Patient is vaping daily.  09/28/2022 am a pack of cigs in 4days 01/05/23  Vaping Use   Vaping status: Every Day  Substance and Sexual Activity   Alcohol use: Not Currently    Alcohol/week: 0.0 standard drinks of alcohol    Comment: 12/05/17 pt stated he has drinked 1/2 drink in 3 years   Drug use: No   Sexual activity: Yes  Other Topics Concern   Not on file  Social History Narrative   Daily caffeine(Mountain Dew) Lives in [place] with spouse.Unemployed due to chronic back/ leg pain         Are you right handed or left handed? Left Handed    Are you currently employed ? No    What is your current occupation? No   Do you live at home alone? no   Who lives with you? Lives with wife and 3 doggies   What type of home do you live in: 1 story or 2 story? Lives in one story home       Social Determinants of Health   Financial Resource Strain: Not on file  Food Insecurity: Not on file  Transportation Needs: Not on file  Physical Activity: Not on file  Stress: Not on file  Social Connections: Not on file  Intimate Partner Violence: Not on file   Current Outpatient Medications on File Prior to Visit  Medication Sig Dispense Refill   albuterol (PROVENTIL) (2.5 MG/3ML) 0.083% nebulizer solution Take 3 mLs (2.5 mg total) by nebulization every 6 (six) hours as needed for wheezing or shortness of breath. 75 mL 12   albuterol (VENTOLIN HFA) 108 (90 Base) MCG/ACT inhaler INHALE 1-2 PUFFS INTO THE LUNGS EVERY 6 HOURS AS NEEDED FOR WHEEZING OR SHORTNESS OF BREATH 8.5 g 5  b complex vitamins tablet Take 1 tablet by mouth daily.      bisoprolol (ZEBETA) 10 MG tablet Take 1 tablet (10 mg  total) by mouth daily. 90 tablet 1   clobetasol cream (TEMOVATE) 0.05 % Apply 1 Application topically 2 (two) times daily. 60 g 2   clotrimazole-betamethasone (LOTRISONE) cream Apply 1 Application topically daily. 45 g 5   colchicine 0.6 MG tablet TAKE 2 TABLETS BY MOUTH AT ONCE. THEN TAKE 1 TABLET 1 HOUR LATER. WAIT 2 DAYS, THEN BEGIN TAKING 1 TABLET DAILY TO PREVENT ACUTE GOUT. 33 tablet 2   Continuous Blood Gluc Sensor (DEXCOM G7 SENSOR) MISC 3 each by Does not apply route every 30 (thirty) days. Apply 1 sensor every 10 days 9 each 4   Continuous Blood Gluc Transmit (DEXCOM G6 TRANSMITTER) MISC USE AS DIRECTED 1 each 3   Continuous Glucose Receiver (DEXCOM G7 RECEIVER) DEVI See admin instructions.     diltiazem (CARDIZEM CD) 120 MG 24 hr capsule TAKE 1 CAPSULE BY MOUTH DAILY 90 capsule 0   Dulaglutide (TRULICITY) 3 MG/0.5ML SOPN Inject 3 mg as directed once a week. 6 mL 3   famotidine (PEPCID) 20 MG tablet One after supper 30 tablet 11   febuxostat (ULORIC) 40 MG tablet TAKE 1 TABLET BY MOUTH DAILY 30 tablet 3   Fluticasone-Umeclidin-Vilant (TRELEGY ELLIPTA) 100-62.5-25 MCG/ACT AEPB Inhale 1 puff into the lungs every morning. 6 each 0   Fluticasone-Umeclidin-Vilant (TRELEGY ELLIPTA) 100-62.5-25 MCG/ACT AEPB One click each am 28 each 5   gabapentin (NEURONTIN) 300 MG capsule TAKE 3 CAPSULES BY MOUTH THREE TIMES DAILY FOR NEUROPATHY 540 capsule 2   Glucagon 3 MG/DOSE POWD Place 3 mg into the nose once as needed for up to 1 dose. 1 each 11   Insulin Glargine (BASAGLAR KWIKPEN) 100 UNIT/ML Inject 30 Units into the skin daily. (Patient not taking: Reported on 01/05/2023) 30 mL 3   Insulin Pen Needle (EASY COMFORT PEN NEEDLES) 31G X 5 MM MISC To use with as needed insulin dosing up to four times daily 400 each 3   insulin regular human CONCENTRATED (HUMULIN R U-500 KWIKPEN) 500 UNIT/ML KwikPen Inject 260 Units into the skin daily. Use up to 250-260 units of insulin daily as advised 48 mL 1    ketoconazole (NIZORAL) 2 % cream Apply 1 Application topically daily. 60 g 2   losartan (COZAAR) 50 MG tablet TAKE 1 TABLET BY MOUTH DAILY 90 tablet 4   metolazone (ZAROXOLYN) 2.5 MG tablet TAKE 1 TABLET BY MOUTH ON TUESDAY AND THURSDAY 30 MINUTES BEFORE TAKING TORSEMIDE OR AS DIRECTED 45 tablet 0   mupirocin ointment (BACTROBAN) 2 % Ply small amount to effected area twice daily for 10 days. 22 g 1   nitroGLYCERIN (NITROSTAT) 0.4 MG SL tablet Place 1 tablet (0.4 mg total) under the tongue every 5 (five) minutes x 3 doses as needed for chest pain. 25 tablet 3   oxyCODONE-acetaminophen (PERCOCET) 10-325 MG tablet Take 1.5 tablets by mouth in the morning, at noon, in the evening, and at bedtime.     pantoprazole (PROTONIX) 40 MG tablet Take 1 tablet (40 mg total) by mouth daily. Take 30-60 min before first meal of the day (Patient not taking: Reported on 01/05/2023) 30 tablet 11   potassium chloride SA (KLOR-CON M) 20 MEQ tablet TAKE 1 TABLET BY MOUTH DAILY. TAKE AN EXTRA TABLET ON TUESDAY AND THURSDAY 30 tablet 6   predniSONE (DELTASONE) 10 MG tablet 2 daily until breathing/cough better then  1 x 3 days and stop     primidone (MYSOLINE) 50 MG tablet Take 0.5 tablets (25 mg total) by mouth daily. 45 tablet 3   rosuvastatin (CRESTOR) 10 MG tablet Take 1 tablet (10 mg total) by mouth daily. 90 tablet 1   sulfamethoxazole-trimethoprim (BACTRIM DS) 800-160 MG tablet Take 1 tablet by mouth 2 (two) times daily. (Patient not taking: Reported on 01/05/2023) 28 tablet 0   tacrolimus (PROTOPIC) 0.1 % ointment Apply topically 2 (two) times daily. 100 g 2   tamsulosin (FLOMAX) 0.4 MG CAPS capsule TAKE 1 CAPSULE BY MOUTH EACH NIGHT AT BEDTIME 30 capsule 11   torsemide (DEMADEX) 20 MG tablet Take 2 tablets in the am and 1 tablet in the pm 270 tablet 2   triamcinolone cream (KENALOG) 0.1 % Apply 1 Application topically daily as needed. 453 g 2   VITAMIN A PO Take 2,400 mcg by mouth daily.      Vitamin D, Ergocalciferol,  (DRISDOL) 1.25 MG (50000 UNIT) CAPS capsule Take 1 capsule (50,000 Units total) by mouth every 7 (seven) days. 12 capsule 3   warfarin (COUMADIN) 5 MG tablet Take 1/2 to 1 tablet daily or as directed 70 tablet 1   No current facility-administered medications on file prior to visit.   Allergies  Allergen Reactions   Adhesive [Tape]     itching   Latex Itching    When tape is on the skin too long skin gets red & itching   Family History  Problem Relation Age of Onset   Diabetes Mother    Heart disease Father    Lung cancer Father    Diabetes Maternal Grandmother    Colon cancer Neg Hx    Anesthesia problems Neg Hx    Hypotension Neg Hx    Malignant hyperthermia Neg Hx    Pseudochol deficiency Neg Hx    Esophageal cancer Neg Hx    Pancreatic cancer Neg Hx    Stomach cancer Neg Hx    PE: There were no vitals taken for this visit. Wt Readings from Last 3 Encounters:  01/05/23 236 lb (107 kg)  11/16/22 255 lb 6.4 oz (115.8 kg)  11/07/22 247 lb (112 kg)   Constitutional: overweight, in NAD, walks with a cane Eyes: no exophthalmos ENT: no thyromegaly, no cervical lymphadenopathy Cardiovascular: RRR, No MRG Respiratory: CTA B L lung; + wheezing RLL Musculoskeletal: no deformities Skin: + rash B shins - ?NLD; also cigarette burns on bilateral legs as he dropped his cigarette while sleeping Neurological: +  tremor with outstretched hands  ASSESSMENT: 1. DM2, insulin-dependent, uncontrolled, with complications - CHF - A fib/flutter - PAD - CKD stage III - PN - CAD  2. HL - fatty liver  PLAN:  1. Patient with longstanding, uncontrolled, type 2 diabetes, on injectable antidiabetic regimen with GLP-1 receptor agonist and U-500 insulin, to which we added basal insulin at last visit.  At that time, sugars were fluctuating mainly above the upper limit of the target range, between 180 and 250.  He was occasionally dropping his sugars to the normal range and occasionally lower but  upon questioning, this was due to taking the U-500 insulin after meals.  This was quite frequent.  Also, he was not taking U-500 insulin if sugars were at goal before meals.  We discussed about the importance of taking insulin 30 minutes before meals and not to skip the insulin completely.  To improve his morning sugars, I recommended Basaglar along with the  previous regimen. -Of note, he previously could not tolerate the higher dose of Trulicity due to GI side effects. CGM interpretation: -At today's visit, we reviewed his CGM downloads: It appears that *** of values are in target range (goal >70%), while *** are higher than 180 (goal <25%), and *** are lower than 70 (goal <4%).  The calculated average blood sugar is ***.  The projected HbA1c for the next 3 months (GMI) is ***. -Reviewing the CGM trends, ***  - I suggested to:  Patient Instructions  Please continue: - Trulicity 3 mg weekly - U500 insulin - 30 min before meals 60-80 units in the morning  40-60 units in the evening - Basaglar 30 units daily at bedtime  Please return in 3 months.  - we checked his HbA1c: 7%  - advised to check sugars at different times of the day - 4x a day, rotating check times - advised for yearly eye exams >> he is UTD - he has severe peripheral neuropathy for which she sees podiatry and pain management.   - return to clinic in 3-4 months  2. HL -Reviewed latest lipid panel from 07/2022: LDL goal, lower than 55, triglyceride is high but improved, HDL low -He was taken off pravastatin 80 mg daily due to muscle weakness.  However, he felt that the muscle weakness was more related to the spine. -Now on Crestor 10 mg daily  Carlus Pavlov, MD PhD [facility] Endocrinology

## 2023-01-10 ENCOUNTER — Telehealth: Payer: Self-pay

## 2023-01-10 NOTE — Telephone Encounter (Signed)
Dr. Loleta Chance I have tried several agencies to get help for this pt. [name] MRN [id number] . I have even reached out to [employer] to find someone but th

## 2023-01-12 ENCOUNTER — Ambulatory Visit: Payer: Medicare Other | Attending: Cardiology | Admitting: *Deleted

## 2023-01-12 DIAGNOSIS — I480 Paroxysmal atrial fibrillation: Secondary | ICD-10-CM

## 2023-01-12 DIAGNOSIS — I4892 Unspecified atrial flutter: Secondary | ICD-10-CM

## 2023-01-12 DIAGNOSIS — Z7901 Long term (current) use of anticoagulants: Secondary | ICD-10-CM | POA: Diagnosis not present

## 2023-01-12 LAB — POCT INR: INR: 2.5 (ref 2.0–3.0)

## 2023-01-12 NOTE — Patient Instructions (Addendum)
Description   Continue taking warfarin 1/2 tablet daily except for 1 tablet every Fridays. Recheck INR in 4 weeks. Anticoagulation Clinic [phone]

## 2023-01-19 DIAGNOSIS — J4489 Other specified chronic obstructive pulmonary disease: Secondary | ICD-10-CM | POA: Diagnosis not present

## 2023-01-19 DIAGNOSIS — M6259 Muscle wasting and atrophy, not elsewhere classified, multiple sites: Secondary | ICD-10-CM | POA: Diagnosis not present

## 2023-01-19 DIAGNOSIS — I2510 Atherosclerotic heart disease of native coronary artery without angina pectoris: Secondary | ICD-10-CM | POA: Diagnosis not present

## 2023-01-19 DIAGNOSIS — L570 Actinic keratosis: Secondary | ICD-10-CM | POA: Diagnosis not present

## 2023-01-19 DIAGNOSIS — E1142 Type 2 diabetes mellitus with diabetic polyneuropathy: Secondary | ICD-10-CM | POA: Diagnosis not present

## 2023-01-19 DIAGNOSIS — M1990 Unspecified osteoarthritis, unspecified site: Secondary | ICD-10-CM | POA: Diagnosis not present

## 2023-01-19 DIAGNOSIS — I4891 Unspecified atrial fibrillation: Secondary | ICD-10-CM | POA: Diagnosis not present

## 2023-01-19 DIAGNOSIS — R1314 Dysphagia, pharyngoesophageal phase: Secondary | ICD-10-CM | POA: Diagnosis not present

## 2023-01-19 DIAGNOSIS — G250 Essential tremor: Secondary | ICD-10-CM | POA: Diagnosis not present

## 2023-01-19 DIAGNOSIS — G8929 Other chronic pain: Secondary | ICD-10-CM | POA: Diagnosis not present

## 2023-01-19 DIAGNOSIS — I671 Cerebral aneurysm, nonruptured: Secondary | ICD-10-CM | POA: Diagnosis not present

## 2023-01-19 DIAGNOSIS — I110 Hypertensive heart disease with heart failure: Secondary | ICD-10-CM | POA: Diagnosis not present

## 2023-01-19 DIAGNOSIS — E519 Thiamine deficiency, unspecified: Secondary | ICD-10-CM | POA: Diagnosis not present

## 2023-01-19 DIAGNOSIS — M479 Spondylosis, unspecified: Secondary | ICD-10-CM | POA: Diagnosis not present

## 2023-01-19 DIAGNOSIS — N281 Cyst of kidney, acquired: Secondary | ICD-10-CM | POA: Diagnosis not present

## 2023-01-19 DIAGNOSIS — M48062 Spinal stenosis, lumbar region with neurogenic claudication: Secondary | ICD-10-CM | POA: Diagnosis not present

## 2023-01-19 DIAGNOSIS — M5126 Other intervertebral disc displacement, lumbar region: Secondary | ICD-10-CM | POA: Diagnosis not present

## 2023-01-19 DIAGNOSIS — I509 Heart failure, unspecified: Secondary | ICD-10-CM | POA: Diagnosis not present

## 2023-01-19 DIAGNOSIS — J439 Emphysema, unspecified: Secondary | ICD-10-CM | POA: Diagnosis not present

## 2023-01-19 DIAGNOSIS — G4733 Obstructive sleep apnea (adult) (pediatric): Secondary | ICD-10-CM | POA: Diagnosis not present

## 2023-01-19 DIAGNOSIS — G9589 Other specified diseases of spinal cord: Secondary | ICD-10-CM | POA: Diagnosis not present

## 2023-01-19 DIAGNOSIS — I4892 Unspecified atrial flutter: Secondary | ICD-10-CM | POA: Diagnosis not present

## 2023-01-19 DIAGNOSIS — M4802 Spinal stenosis, cervical region: Secondary | ICD-10-CM | POA: Diagnosis not present

## 2023-01-25 ENCOUNTER — Encounter: Payer: Self-pay | Admitting: Cardiology

## 2023-01-25 ENCOUNTER — Telehealth: Payer: Self-pay | Admitting: Internal Medicine

## 2023-01-25 ENCOUNTER — Other Ambulatory Visit: Payer: Self-pay | Admitting: Internal Medicine

## 2023-01-25 ENCOUNTER — Telehealth: Payer: Self-pay | Admitting: Student

## 2023-01-25 DIAGNOSIS — I2510 Atherosclerotic heart disease of native coronary artery without angina pectoris: Secondary | ICD-10-CM | POA: Diagnosis not present

## 2023-01-25 DIAGNOSIS — M6259 Muscle wasting and atrophy, not elsewhere classified, multiple sites: Secondary | ICD-10-CM | POA: Diagnosis not present

## 2023-01-25 DIAGNOSIS — M1990 Unspecified osteoarthritis, unspecified site: Secondary | ICD-10-CM | POA: Diagnosis not present

## 2023-01-25 DIAGNOSIS — M479 Spondylosis, unspecified: Secondary | ICD-10-CM | POA: Diagnosis not present

## 2023-01-25 DIAGNOSIS — R1314 Dysphagia, pharyngoesophageal phase: Secondary | ICD-10-CM | POA: Diagnosis not present

## 2023-01-25 DIAGNOSIS — I509 Heart failure, unspecified: Secondary | ICD-10-CM | POA: Diagnosis not present

## 2023-01-25 DIAGNOSIS — M4802 Spinal stenosis, cervical region: Secondary | ICD-10-CM | POA: Diagnosis not present

## 2023-01-25 DIAGNOSIS — J439 Emphysema, unspecified: Secondary | ICD-10-CM | POA: Diagnosis not present

## 2023-01-25 DIAGNOSIS — G8929 Other chronic pain: Secondary | ICD-10-CM | POA: Diagnosis not present

## 2023-01-25 DIAGNOSIS — G4733 Obstructive sleep apnea (adult) (pediatric): Secondary | ICD-10-CM | POA: Diagnosis not present

## 2023-01-25 DIAGNOSIS — G250 Essential tremor: Secondary | ICD-10-CM | POA: Diagnosis not present

## 2023-01-25 DIAGNOSIS — E519 Thiamine deficiency, unspecified: Secondary | ICD-10-CM | POA: Diagnosis not present

## 2023-01-25 DIAGNOSIS — J4489 Other specified chronic obstructive pulmonary disease: Secondary | ICD-10-CM | POA: Diagnosis not present

## 2023-01-25 DIAGNOSIS — I110 Hypertensive heart disease with heart failure: Secondary | ICD-10-CM | POA: Diagnosis not present

## 2023-01-25 DIAGNOSIS — N281 Cyst of kidney, acquired: Secondary | ICD-10-CM | POA: Diagnosis not present

## 2023-01-25 DIAGNOSIS — E1142 Type 2 diabetes mellitus with diabetic polyneuropathy: Secondary | ICD-10-CM | POA: Diagnosis not present

## 2023-01-25 DIAGNOSIS — I4892 Unspecified atrial flutter: Secondary | ICD-10-CM | POA: Diagnosis not present

## 2023-01-25 DIAGNOSIS — G9589 Other specified diseases of spinal cord: Secondary | ICD-10-CM | POA: Diagnosis not present

## 2023-01-25 DIAGNOSIS — E119 Type 2 diabetes mellitus without complications: Secondary | ICD-10-CM

## 2023-01-25 DIAGNOSIS — M5126 Other intervertebral disc displacement, lumbar region: Secondary | ICD-10-CM | POA: Diagnosis not present

## 2023-01-25 DIAGNOSIS — L570 Actinic keratosis: Secondary | ICD-10-CM | POA: Diagnosis not present

## 2023-01-25 DIAGNOSIS — I671 Cerebral aneurysm, nonruptured: Secondary | ICD-10-CM | POA: Diagnosis not present

## 2023-01-25 DIAGNOSIS — M48062 Spinal stenosis, lumbar region with neurogenic claudication: Secondary | ICD-10-CM | POA: Diagnosis not present

## 2023-01-25 DIAGNOSIS — I4891 Unspecified atrial fibrillation: Secondary | ICD-10-CM | POA: Diagnosis not present

## 2023-01-25 NOTE — Telephone Encounter (Signed)
Please see last signed tel encounter.  PT was upset when I read him Dr. Rolin Barry orders. States he was the one that took him off Prilosec.  In short he wants to go back to Prilosec. (I think last encounters were not worded correctly..No notes on file sure.  Please call in Prilosec if you will for the PT.  Pharm is: [facility].

## 2023-01-26 DIAGNOSIS — R1314 Dysphagia, pharyngoesophageal phase: Secondary | ICD-10-CM | POA: Diagnosis not present

## 2023-01-26 DIAGNOSIS — M5126 Other intervertebral disc displacement, lumbar region: Secondary | ICD-10-CM | POA: Diagnosis not present

## 2023-01-26 DIAGNOSIS — Z79899 Other long term (current) drug therapy: Secondary | ICD-10-CM | POA: Diagnosis not present

## 2023-01-26 DIAGNOSIS — M5136 Other intervertebral disc degeneration, lumbar region: Secondary | ICD-10-CM | POA: Diagnosis not present

## 2023-01-26 DIAGNOSIS — I671 Cerebral aneurysm, nonruptured: Secondary | ICD-10-CM | POA: Diagnosis not present

## 2023-01-26 DIAGNOSIS — E1142 Type 2 diabetes mellitus with diabetic polyneuropathy: Secondary | ICD-10-CM | POA: Diagnosis not present

## 2023-01-26 DIAGNOSIS — M5030 Other cervical disc degeneration, unspecified cervical region: Secondary | ICD-10-CM | POA: Diagnosis not present

## 2023-01-26 DIAGNOSIS — G8929 Other chronic pain: Secondary | ICD-10-CM | POA: Diagnosis not present

## 2023-01-26 DIAGNOSIS — I509 Heart failure, unspecified: Secondary | ICD-10-CM | POA: Diagnosis not present

## 2023-01-26 DIAGNOSIS — M1990 Unspecified osteoarthritis, unspecified site: Secondary | ICD-10-CM | POA: Diagnosis not present

## 2023-01-26 DIAGNOSIS — M6259 Muscle wasting and atrophy, not elsewhere classified, multiple sites: Secondary | ICD-10-CM | POA: Diagnosis not present

## 2023-01-26 DIAGNOSIS — I4891 Unspecified atrial fibrillation: Secondary | ICD-10-CM | POA: Diagnosis not present

## 2023-01-26 DIAGNOSIS — G250 Essential tremor: Secondary | ICD-10-CM | POA: Diagnosis not present

## 2023-01-26 DIAGNOSIS — I4892 Unspecified atrial flutter: Secondary | ICD-10-CM | POA: Diagnosis not present

## 2023-01-26 DIAGNOSIS — I110 Hypertensive heart disease with heart failure: Secondary | ICD-10-CM | POA: Diagnosis not present

## 2023-01-26 DIAGNOSIS — J4489 Other specified chronic obstructive pulmonary disease: Secondary | ICD-10-CM | POA: Diagnosis not present

## 2023-01-26 DIAGNOSIS — G4733 Obstructive sleep apnea (adult) (pediatric): Secondary | ICD-10-CM | POA: Diagnosis not present

## 2023-01-26 DIAGNOSIS — E519 Thiamine deficiency, unspecified: Secondary | ICD-10-CM | POA: Diagnosis not present

## 2023-01-26 DIAGNOSIS — G9589 Other specified diseases of spinal cord: Secondary | ICD-10-CM | POA: Diagnosis not present

## 2023-01-26 DIAGNOSIS — N281 Cyst of kidney, acquired: Secondary | ICD-10-CM | POA: Diagnosis not present

## 2023-01-26 DIAGNOSIS — I2510 Atherosclerotic heart disease of native coronary artery without angina pectoris: Secondary | ICD-10-CM | POA: Diagnosis not present

## 2023-01-26 DIAGNOSIS — M48062 Spinal stenosis, lumbar region with neurogenic claudication: Secondary | ICD-10-CM | POA: Diagnosis not present

## 2023-01-26 DIAGNOSIS — J449 Chronic obstructive pulmonary disease, unspecified: Secondary | ICD-10-CM | POA: Diagnosis not present

## 2023-01-26 DIAGNOSIS — M479 Spondylosis, unspecified: Secondary | ICD-10-CM | POA: Diagnosis not present

## 2023-01-26 DIAGNOSIS — J439 Emphysema, unspecified: Secondary | ICD-10-CM | POA: Diagnosis not present

## 2023-01-26 DIAGNOSIS — L570 Actinic keratosis: Secondary | ICD-10-CM | POA: Diagnosis not present

## 2023-01-26 DIAGNOSIS — M4802 Spinal stenosis, cervical region: Secondary | ICD-10-CM | POA: Diagnosis not present

## 2023-01-27 DIAGNOSIS — J439 Emphysema, unspecified: Secondary | ICD-10-CM | POA: Diagnosis not present

## 2023-01-27 DIAGNOSIS — M4802 Spinal stenosis, cervical region: Secondary | ICD-10-CM | POA: Diagnosis not present

## 2023-01-27 DIAGNOSIS — I2510 Atherosclerotic heart disease of native coronary artery without angina pectoris: Secondary | ICD-10-CM | POA: Diagnosis not present

## 2023-01-27 DIAGNOSIS — R1314 Dysphagia, pharyngoesophageal phase: Secondary | ICD-10-CM | POA: Diagnosis not present

## 2023-01-27 DIAGNOSIS — G8929 Other chronic pain: Secondary | ICD-10-CM | POA: Diagnosis not present

## 2023-01-27 DIAGNOSIS — I110 Hypertensive heart disease with heart failure: Secondary | ICD-10-CM | POA: Diagnosis not present

## 2023-01-27 DIAGNOSIS — I4891 Unspecified atrial fibrillation: Secondary | ICD-10-CM | POA: Diagnosis not present

## 2023-01-27 DIAGNOSIS — M5126 Other intervertebral disc displacement, lumbar region: Secondary | ICD-10-CM | POA: Diagnosis not present

## 2023-01-27 DIAGNOSIS — M48062 Spinal stenosis, lumbar region with neurogenic claudication: Secondary | ICD-10-CM | POA: Diagnosis not present

## 2023-01-27 DIAGNOSIS — M1990 Unspecified osteoarthritis, unspecified site: Secondary | ICD-10-CM | POA: Diagnosis not present

## 2023-01-27 DIAGNOSIS — G250 Essential tremor: Secondary | ICD-10-CM | POA: Diagnosis not present

## 2023-01-27 DIAGNOSIS — I509 Heart failure, unspecified: Secondary | ICD-10-CM | POA: Diagnosis not present

## 2023-01-27 DIAGNOSIS — E1142 Type 2 diabetes mellitus with diabetic polyneuropathy: Secondary | ICD-10-CM | POA: Diagnosis not present

## 2023-01-27 DIAGNOSIS — E519 Thiamine deficiency, unspecified: Secondary | ICD-10-CM | POA: Diagnosis not present

## 2023-01-27 DIAGNOSIS — G4733 Obstructive sleep apnea (adult) (pediatric): Secondary | ICD-10-CM | POA: Diagnosis not present

## 2023-01-27 DIAGNOSIS — N281 Cyst of kidney, acquired: Secondary | ICD-10-CM | POA: Diagnosis not present

## 2023-01-27 DIAGNOSIS — M479 Spondylosis, unspecified: Secondary | ICD-10-CM | POA: Diagnosis not present

## 2023-01-27 DIAGNOSIS — I4892 Unspecified atrial flutter: Secondary | ICD-10-CM | POA: Diagnosis not present

## 2023-01-27 DIAGNOSIS — L570 Actinic keratosis: Secondary | ICD-10-CM | POA: Diagnosis not present

## 2023-01-27 DIAGNOSIS — J4489 Other specified chronic obstructive pulmonary disease: Secondary | ICD-10-CM | POA: Diagnosis not present

## 2023-01-27 DIAGNOSIS — I671 Cerebral aneurysm, nonruptured: Secondary | ICD-10-CM | POA: Diagnosis not present

## 2023-01-27 DIAGNOSIS — M6259 Muscle wasting and atrophy, not elsewhere classified, multiple sites: Secondary | ICD-10-CM | POA: Diagnosis not present

## 2023-01-27 DIAGNOSIS — G9589 Other specified diseases of spinal cord: Secondary | ICD-10-CM | POA: Diagnosis not present

## 2023-01-27 NOTE — Telephone Encounter (Signed)
Called pt could not get in touch or leave a vm, if pt calls back please inform pt that, if he is continuing to have bad reflux he should f/u with his pcp or see GI, reflux is not a pulmonary issue so we can not continue to refill this medication.

## 2023-01-27 NOTE — Telephone Encounter (Signed)
Closing encounter

## 2023-01-30 ENCOUNTER — Other Ambulatory Visit: Payer: Self-pay | Admitting: Cardiology

## 2023-01-30 ENCOUNTER — Other Ambulatory Visit: Payer: Self-pay | Admitting: Internal Medicine

## 2023-01-30 DIAGNOSIS — M4802 Spinal stenosis, cervical region: Secondary | ICD-10-CM | POA: Diagnosis not present

## 2023-01-30 DIAGNOSIS — G4733 Obstructive sleep apnea (adult) (pediatric): Secondary | ICD-10-CM | POA: Diagnosis not present

## 2023-01-30 DIAGNOSIS — L570 Actinic keratosis: Secondary | ICD-10-CM | POA: Diagnosis not present

## 2023-01-30 DIAGNOSIS — I671 Cerebral aneurysm, nonruptured: Secondary | ICD-10-CM | POA: Diagnosis not present

## 2023-01-30 DIAGNOSIS — J439 Emphysema, unspecified: Secondary | ICD-10-CM | POA: Diagnosis not present

## 2023-01-30 DIAGNOSIS — G250 Essential tremor: Secondary | ICD-10-CM | POA: Diagnosis not present

## 2023-01-30 DIAGNOSIS — M5126 Other intervertebral disc displacement, lumbar region: Secondary | ICD-10-CM | POA: Diagnosis not present

## 2023-01-30 DIAGNOSIS — I2510 Atherosclerotic heart disease of native coronary artery without angina pectoris: Secondary | ICD-10-CM | POA: Diagnosis not present

## 2023-01-30 DIAGNOSIS — M48062 Spinal stenosis, lumbar region with neurogenic claudication: Secondary | ICD-10-CM | POA: Diagnosis not present

## 2023-01-30 DIAGNOSIS — R1314 Dysphagia, pharyngoesophageal phase: Secondary | ICD-10-CM | POA: Diagnosis not present

## 2023-01-30 DIAGNOSIS — G8929 Other chronic pain: Secondary | ICD-10-CM | POA: Diagnosis not present

## 2023-01-30 DIAGNOSIS — E1122 Type 2 diabetes mellitus with diabetic chronic kidney disease: Secondary | ICD-10-CM

## 2023-01-30 DIAGNOSIS — M1990 Unspecified osteoarthritis, unspecified site: Secondary | ICD-10-CM | POA: Diagnosis not present

## 2023-01-30 DIAGNOSIS — I4891 Unspecified atrial fibrillation: Secondary | ICD-10-CM | POA: Diagnosis not present

## 2023-01-30 DIAGNOSIS — I4892 Unspecified atrial flutter: Secondary | ICD-10-CM | POA: Diagnosis not present

## 2023-01-30 DIAGNOSIS — I509 Heart failure, unspecified: Secondary | ICD-10-CM | POA: Diagnosis not present

## 2023-01-30 DIAGNOSIS — E1142 Type 2 diabetes mellitus with diabetic polyneuropathy: Secondary | ICD-10-CM | POA: Diagnosis not present

## 2023-01-30 DIAGNOSIS — I110 Hypertensive heart disease with heart failure: Secondary | ICD-10-CM | POA: Diagnosis not present

## 2023-01-30 DIAGNOSIS — E519 Thiamine deficiency, unspecified: Secondary | ICD-10-CM | POA: Diagnosis not present

## 2023-01-30 DIAGNOSIS — N281 Cyst of kidney, acquired: Secondary | ICD-10-CM | POA: Diagnosis not present

## 2023-01-30 DIAGNOSIS — J4489 Other specified chronic obstructive pulmonary disease: Secondary | ICD-10-CM | POA: Diagnosis not present

## 2023-01-30 DIAGNOSIS — M479 Spondylosis, unspecified: Secondary | ICD-10-CM | POA: Diagnosis not present

## 2023-01-30 DIAGNOSIS — M6259 Muscle wasting and atrophy, not elsewhere classified, multiple sites: Secondary | ICD-10-CM | POA: Diagnosis not present

## 2023-01-30 DIAGNOSIS — G9589 Other specified diseases of spinal cord: Secondary | ICD-10-CM | POA: Diagnosis not present

## 2023-01-31 ENCOUNTER — Other Ambulatory Visit: Payer: Self-pay | Admitting: Nurse Practitioner

## 2023-01-31 DIAGNOSIS — G250 Essential tremor: Secondary | ICD-10-CM | POA: Diagnosis not present

## 2023-01-31 DIAGNOSIS — M479 Spondylosis, unspecified: Secondary | ICD-10-CM | POA: Diagnosis not present

## 2023-01-31 DIAGNOSIS — I4891 Unspecified atrial fibrillation: Secondary | ICD-10-CM | POA: Diagnosis not present

## 2023-01-31 DIAGNOSIS — I671 Cerebral aneurysm, nonruptured: Secondary | ICD-10-CM | POA: Diagnosis not present

## 2023-01-31 DIAGNOSIS — N281 Cyst of kidney, acquired: Secondary | ICD-10-CM | POA: Diagnosis not present

## 2023-01-31 DIAGNOSIS — G9589 Other specified diseases of spinal cord: Secondary | ICD-10-CM | POA: Diagnosis not present

## 2023-01-31 DIAGNOSIS — G4733 Obstructive sleep apnea (adult) (pediatric): Secondary | ICD-10-CM | POA: Diagnosis not present

## 2023-01-31 DIAGNOSIS — M6259 Muscle wasting and atrophy, not elsewhere classified, multiple sites: Secondary | ICD-10-CM | POA: Diagnosis not present

## 2023-01-31 DIAGNOSIS — E1142 Type 2 diabetes mellitus with diabetic polyneuropathy: Secondary | ICD-10-CM | POA: Diagnosis not present

## 2023-01-31 DIAGNOSIS — I509 Heart failure, unspecified: Secondary | ICD-10-CM | POA: Diagnosis not present

## 2023-01-31 DIAGNOSIS — M48062 Spinal stenosis, lumbar region with neurogenic claudication: Secondary | ICD-10-CM | POA: Diagnosis not present

## 2023-01-31 DIAGNOSIS — G8929 Other chronic pain: Secondary | ICD-10-CM | POA: Diagnosis not present

## 2023-01-31 DIAGNOSIS — I110 Hypertensive heart disease with heart failure: Secondary | ICD-10-CM | POA: Diagnosis not present

## 2023-01-31 DIAGNOSIS — M5126 Other intervertebral disc displacement, lumbar region: Secondary | ICD-10-CM | POA: Diagnosis not present

## 2023-01-31 DIAGNOSIS — I2510 Atherosclerotic heart disease of native coronary artery without angina pectoris: Secondary | ICD-10-CM | POA: Diagnosis not present

## 2023-01-31 DIAGNOSIS — R1314 Dysphagia, pharyngoesophageal phase: Secondary | ICD-10-CM | POA: Diagnosis not present

## 2023-01-31 DIAGNOSIS — L570 Actinic keratosis: Secondary | ICD-10-CM | POA: Diagnosis not present

## 2023-01-31 DIAGNOSIS — I4892 Unspecified atrial flutter: Secondary | ICD-10-CM | POA: Diagnosis not present

## 2023-01-31 DIAGNOSIS — J4489 Other specified chronic obstructive pulmonary disease: Secondary | ICD-10-CM | POA: Diagnosis not present

## 2023-01-31 DIAGNOSIS — M1990 Unspecified osteoarthritis, unspecified site: Secondary | ICD-10-CM | POA: Diagnosis not present

## 2023-01-31 DIAGNOSIS — I10 Essential (primary) hypertension: Secondary | ICD-10-CM

## 2023-01-31 DIAGNOSIS — E519 Thiamine deficiency, unspecified: Secondary | ICD-10-CM | POA: Diagnosis not present

## 2023-01-31 DIAGNOSIS — J439 Emphysema, unspecified: Secondary | ICD-10-CM | POA: Diagnosis not present

## 2023-01-31 DIAGNOSIS — M4802 Spinal stenosis, cervical region: Secondary | ICD-10-CM | POA: Diagnosis not present

## 2023-02-01 ENCOUNTER — Ambulatory Visit: Payer: Commercial Managed Care - HMO | Admitting: Family Medicine

## 2023-02-02 DIAGNOSIS — M4802 Spinal stenosis, cervical region: Secondary | ICD-10-CM | POA: Diagnosis not present

## 2023-02-02 DIAGNOSIS — I4891 Unspecified atrial fibrillation: Secondary | ICD-10-CM | POA: Diagnosis not present

## 2023-02-02 DIAGNOSIS — M479 Spondylosis, unspecified: Secondary | ICD-10-CM | POA: Diagnosis not present

## 2023-02-02 DIAGNOSIS — I110 Hypertensive heart disease with heart failure: Secondary | ICD-10-CM | POA: Diagnosis not present

## 2023-02-02 DIAGNOSIS — R1314 Dysphagia, pharyngoesophageal phase: Secondary | ICD-10-CM | POA: Diagnosis not present

## 2023-02-02 DIAGNOSIS — M1990 Unspecified osteoarthritis, unspecified site: Secondary | ICD-10-CM | POA: Diagnosis not present

## 2023-02-02 DIAGNOSIS — I4892 Unspecified atrial flutter: Secondary | ICD-10-CM | POA: Diagnosis not present

## 2023-02-02 DIAGNOSIS — M48062 Spinal stenosis, lumbar region with neurogenic claudication: Secondary | ICD-10-CM | POA: Diagnosis not present

## 2023-02-02 DIAGNOSIS — N281 Cyst of kidney, acquired: Secondary | ICD-10-CM | POA: Diagnosis not present

## 2023-02-02 DIAGNOSIS — M6259 Muscle wasting and atrophy, not elsewhere classified, multiple sites: Secondary | ICD-10-CM | POA: Diagnosis not present

## 2023-02-02 DIAGNOSIS — I671 Cerebral aneurysm, nonruptured: Secondary | ICD-10-CM | POA: Diagnosis not present

## 2023-02-02 DIAGNOSIS — I509 Heart failure, unspecified: Secondary | ICD-10-CM | POA: Diagnosis not present

## 2023-02-02 DIAGNOSIS — I2510 Atherosclerotic heart disease of native coronary artery without angina pectoris: Secondary | ICD-10-CM | POA: Diagnosis not present

## 2023-02-02 DIAGNOSIS — G4733 Obstructive sleep apnea (adult) (pediatric): Secondary | ICD-10-CM | POA: Diagnosis not present

## 2023-02-02 DIAGNOSIS — M5126 Other intervertebral disc displacement, lumbar region: Secondary | ICD-10-CM | POA: Diagnosis not present

## 2023-02-02 DIAGNOSIS — L570 Actinic keratosis: Secondary | ICD-10-CM | POA: Diagnosis not present

## 2023-02-02 DIAGNOSIS — G250 Essential tremor: Secondary | ICD-10-CM | POA: Diagnosis not present

## 2023-02-02 DIAGNOSIS — G8929 Other chronic pain: Secondary | ICD-10-CM | POA: Diagnosis not present

## 2023-02-02 DIAGNOSIS — E1142 Type 2 diabetes mellitus with diabetic polyneuropathy: Secondary | ICD-10-CM | POA: Diagnosis not present

## 2023-02-02 DIAGNOSIS — J4489 Other specified chronic obstructive pulmonary disease: Secondary | ICD-10-CM | POA: Diagnosis not present

## 2023-02-02 DIAGNOSIS — E519 Thiamine deficiency, unspecified: Secondary | ICD-10-CM | POA: Diagnosis not present

## 2023-02-02 DIAGNOSIS — J439 Emphysema, unspecified: Secondary | ICD-10-CM | POA: Diagnosis not present

## 2023-02-02 DIAGNOSIS — G9589 Other specified diseases of spinal cord: Secondary | ICD-10-CM | POA: Diagnosis not present

## 2023-02-03 DIAGNOSIS — I4892 Unspecified atrial flutter: Secondary | ICD-10-CM | POA: Diagnosis not present

## 2023-02-03 DIAGNOSIS — L570 Actinic keratosis: Secondary | ICD-10-CM | POA: Diagnosis not present

## 2023-02-03 DIAGNOSIS — I2510 Atherosclerotic heart disease of native coronary artery without angina pectoris: Secondary | ICD-10-CM | POA: Diagnosis not present

## 2023-02-03 DIAGNOSIS — M5126 Other intervertebral disc displacement, lumbar region: Secondary | ICD-10-CM | POA: Diagnosis not present

## 2023-02-03 DIAGNOSIS — M6259 Muscle wasting and atrophy, not elsewhere classified, multiple sites: Secondary | ICD-10-CM | POA: Diagnosis not present

## 2023-02-03 DIAGNOSIS — E1142 Type 2 diabetes mellitus with diabetic polyneuropathy: Secondary | ICD-10-CM | POA: Diagnosis not present

## 2023-02-03 DIAGNOSIS — R1314 Dysphagia, pharyngoesophageal phase: Secondary | ICD-10-CM | POA: Diagnosis not present

## 2023-02-03 DIAGNOSIS — E519 Thiamine deficiency, unspecified: Secondary | ICD-10-CM | POA: Diagnosis not present

## 2023-02-03 DIAGNOSIS — I4891 Unspecified atrial fibrillation: Secondary | ICD-10-CM | POA: Diagnosis not present

## 2023-02-03 DIAGNOSIS — M479 Spondylosis, unspecified: Secondary | ICD-10-CM | POA: Diagnosis not present

## 2023-02-03 DIAGNOSIS — G4733 Obstructive sleep apnea (adult) (pediatric): Secondary | ICD-10-CM | POA: Diagnosis not present

## 2023-02-03 DIAGNOSIS — M1990 Unspecified osteoarthritis, unspecified site: Secondary | ICD-10-CM | POA: Diagnosis not present

## 2023-02-03 DIAGNOSIS — G8929 Other chronic pain: Secondary | ICD-10-CM | POA: Diagnosis not present

## 2023-02-03 DIAGNOSIS — M48062 Spinal stenosis, lumbar region with neurogenic claudication: Secondary | ICD-10-CM | POA: Diagnosis not present

## 2023-02-03 DIAGNOSIS — G250 Essential tremor: Secondary | ICD-10-CM | POA: Diagnosis not present

## 2023-02-03 DIAGNOSIS — I671 Cerebral aneurysm, nonruptured: Secondary | ICD-10-CM | POA: Diagnosis not present

## 2023-02-03 DIAGNOSIS — G9589 Other specified diseases of spinal cord: Secondary | ICD-10-CM | POA: Diagnosis not present

## 2023-02-03 DIAGNOSIS — I110 Hypertensive heart disease with heart failure: Secondary | ICD-10-CM | POA: Diagnosis not present

## 2023-02-03 DIAGNOSIS — J4489 Other specified chronic obstructive pulmonary disease: Secondary | ICD-10-CM | POA: Diagnosis not present

## 2023-02-03 DIAGNOSIS — N281 Cyst of kidney, acquired: Secondary | ICD-10-CM | POA: Diagnosis not present

## 2023-02-03 DIAGNOSIS — M4802 Spinal stenosis, cervical region: Secondary | ICD-10-CM | POA: Diagnosis not present

## 2023-02-03 DIAGNOSIS — I509 Heart failure, unspecified: Secondary | ICD-10-CM | POA: Diagnosis not present

## 2023-02-03 DIAGNOSIS — J439 Emphysema, unspecified: Secondary | ICD-10-CM | POA: Diagnosis not present

## 2023-02-06 DIAGNOSIS — M48062 Spinal stenosis, lumbar region with neurogenic claudication: Secondary | ICD-10-CM | POA: Diagnosis not present

## 2023-02-06 DIAGNOSIS — M6259 Muscle wasting and atrophy, not elsewhere classified, multiple sites: Secondary | ICD-10-CM | POA: Diagnosis not present

## 2023-02-06 DIAGNOSIS — J439 Emphysema, unspecified: Secondary | ICD-10-CM | POA: Diagnosis not present

## 2023-02-06 DIAGNOSIS — M479 Spondylosis, unspecified: Secondary | ICD-10-CM | POA: Diagnosis not present

## 2023-02-06 DIAGNOSIS — I509 Heart failure, unspecified: Secondary | ICD-10-CM | POA: Diagnosis not present

## 2023-02-06 DIAGNOSIS — R1314 Dysphagia, pharyngoesophageal phase: Secondary | ICD-10-CM | POA: Diagnosis not present

## 2023-02-06 DIAGNOSIS — M4802 Spinal stenosis, cervical region: Secondary | ICD-10-CM | POA: Diagnosis not present

## 2023-02-06 DIAGNOSIS — L570 Actinic keratosis: Secondary | ICD-10-CM | POA: Diagnosis not present

## 2023-02-06 DIAGNOSIS — G4733 Obstructive sleep apnea (adult) (pediatric): Secondary | ICD-10-CM | POA: Diagnosis not present

## 2023-02-06 DIAGNOSIS — I4892 Unspecified atrial flutter: Secondary | ICD-10-CM | POA: Diagnosis not present

## 2023-02-06 DIAGNOSIS — I4891 Unspecified atrial fibrillation: Secondary | ICD-10-CM | POA: Diagnosis not present

## 2023-02-06 DIAGNOSIS — I2510 Atherosclerotic heart disease of native coronary artery without angina pectoris: Secondary | ICD-10-CM | POA: Diagnosis not present

## 2023-02-06 DIAGNOSIS — E519 Thiamine deficiency, unspecified: Secondary | ICD-10-CM | POA: Diagnosis not present

## 2023-02-06 DIAGNOSIS — E1142 Type 2 diabetes mellitus with diabetic polyneuropathy: Secondary | ICD-10-CM | POA: Diagnosis not present

## 2023-02-06 DIAGNOSIS — M1990 Unspecified osteoarthritis, unspecified site: Secondary | ICD-10-CM | POA: Diagnosis not present

## 2023-02-06 DIAGNOSIS — G9589 Other specified diseases of spinal cord: Secondary | ICD-10-CM | POA: Diagnosis not present

## 2023-02-06 DIAGNOSIS — J4489 Other specified chronic obstructive pulmonary disease: Secondary | ICD-10-CM | POA: Diagnosis not present

## 2023-02-06 DIAGNOSIS — I110 Hypertensive heart disease with heart failure: Secondary | ICD-10-CM | POA: Diagnosis not present

## 2023-02-06 DIAGNOSIS — G250 Essential tremor: Secondary | ICD-10-CM | POA: Diagnosis not present

## 2023-02-06 DIAGNOSIS — N281 Cyst of kidney, acquired: Secondary | ICD-10-CM | POA: Diagnosis not present

## 2023-02-06 DIAGNOSIS — G8929 Other chronic pain: Secondary | ICD-10-CM | POA: Diagnosis not present

## 2023-02-06 DIAGNOSIS — M5126 Other intervertebral disc displacement, lumbar region: Secondary | ICD-10-CM | POA: Diagnosis not present

## 2023-02-06 DIAGNOSIS — I671 Cerebral aneurysm, nonruptured: Secondary | ICD-10-CM | POA: Diagnosis not present

## 2023-02-09 DIAGNOSIS — L570 Actinic keratosis: Secondary | ICD-10-CM | POA: Diagnosis not present

## 2023-02-09 DIAGNOSIS — M6259 Muscle wasting and atrophy, not elsewhere classified, multiple sites: Secondary | ICD-10-CM | POA: Diagnosis not present

## 2023-02-09 DIAGNOSIS — I2510 Atherosclerotic heart disease of native coronary artery without angina pectoris: Secondary | ICD-10-CM | POA: Diagnosis not present

## 2023-02-09 DIAGNOSIS — E1142 Type 2 diabetes mellitus with diabetic polyneuropathy: Secondary | ICD-10-CM | POA: Diagnosis not present

## 2023-02-09 DIAGNOSIS — M479 Spondylosis, unspecified: Secondary | ICD-10-CM | POA: Diagnosis not present

## 2023-02-09 DIAGNOSIS — M48062 Spinal stenosis, lumbar region with neurogenic claudication: Secondary | ICD-10-CM | POA: Diagnosis not present

## 2023-02-09 DIAGNOSIS — M5126 Other intervertebral disc displacement, lumbar region: Secondary | ICD-10-CM | POA: Diagnosis not present

## 2023-02-09 DIAGNOSIS — M4802 Spinal stenosis, cervical region: Secondary | ICD-10-CM | POA: Diagnosis not present

## 2023-02-09 DIAGNOSIS — G4733 Obstructive sleep apnea (adult) (pediatric): Secondary | ICD-10-CM | POA: Diagnosis not present

## 2023-02-09 DIAGNOSIS — G250 Essential tremor: Secondary | ICD-10-CM | POA: Diagnosis not present

## 2023-02-09 DIAGNOSIS — G9589 Other specified diseases of spinal cord: Secondary | ICD-10-CM | POA: Diagnosis not present

## 2023-02-09 DIAGNOSIS — I509 Heart failure, unspecified: Secondary | ICD-10-CM | POA: Diagnosis not present

## 2023-02-09 DIAGNOSIS — J439 Emphysema, unspecified: Secondary | ICD-10-CM | POA: Diagnosis not present

## 2023-02-09 DIAGNOSIS — I4891 Unspecified atrial fibrillation: Secondary | ICD-10-CM | POA: Diagnosis not present

## 2023-02-09 DIAGNOSIS — E519 Thiamine deficiency, unspecified: Secondary | ICD-10-CM | POA: Diagnosis not present

## 2023-02-09 DIAGNOSIS — I671 Cerebral aneurysm, nonruptured: Secondary | ICD-10-CM | POA: Diagnosis not present

## 2023-02-09 DIAGNOSIS — M1990 Unspecified osteoarthritis, unspecified site: Secondary | ICD-10-CM | POA: Diagnosis not present

## 2023-02-09 DIAGNOSIS — I4892 Unspecified atrial flutter: Secondary | ICD-10-CM | POA: Diagnosis not present

## 2023-02-09 DIAGNOSIS — R1314 Dysphagia, pharyngoesophageal phase: Secondary | ICD-10-CM | POA: Diagnosis not present

## 2023-02-09 DIAGNOSIS — N281 Cyst of kidney, acquired: Secondary | ICD-10-CM | POA: Diagnosis not present

## 2023-02-09 DIAGNOSIS — I110 Hypertensive heart disease with heart failure: Secondary | ICD-10-CM | POA: Diagnosis not present

## 2023-02-09 DIAGNOSIS — G8929 Other chronic pain: Secondary | ICD-10-CM | POA: Diagnosis not present

## 2023-02-09 DIAGNOSIS — J4489 Other specified chronic obstructive pulmonary disease: Secondary | ICD-10-CM | POA: Diagnosis not present

## 2023-02-10 ENCOUNTER — Ambulatory Visit: Payer: Commercial Managed Care - HMO | Admitting: Cardiology

## 2023-02-10 ENCOUNTER — Telehealth: Payer: Self-pay | Admitting: *Deleted

## 2023-02-10 ENCOUNTER — Ambulatory Visit: Payer: Medicare Other

## 2023-02-10 NOTE — Telephone Encounter (Signed)
Pt missed appointment today to have INR checked.  Attempted to call pt to reschedule.  LMOM

## 2023-02-10 NOTE — Telephone Encounter (Signed)
Called pt since he left a message that he needed to reschedule his missed Anticoagulation Clinic appt. Spoke with pt and was able to reschedule appt and pt confirmed appt.

## 2023-02-14 ENCOUNTER — Ambulatory Visit: Payer: Medicare Other | Attending: Internal Medicine

## 2023-02-14 DIAGNOSIS — I480 Paroxysmal atrial fibrillation: Secondary | ICD-10-CM | POA: Diagnosis not present

## 2023-02-14 DIAGNOSIS — Z7901 Long term (current) use of anticoagulants: Secondary | ICD-10-CM | POA: Diagnosis not present

## 2023-02-14 LAB — POCT INR: INR: 2 (ref 2.0–3.0)

## 2023-02-14 NOTE — Patient Instructions (Signed)
Continue taking warfarin 1/2 tablet daily except for 1 tablet every Fridays. Recheck INR in 6 weeks. Anticoagulation Clinic [phone]

## 2023-02-16 DIAGNOSIS — G8929 Other chronic pain: Secondary | ICD-10-CM | POA: Diagnosis not present

## 2023-02-16 DIAGNOSIS — M1990 Unspecified osteoarthritis, unspecified site: Secondary | ICD-10-CM | POA: Diagnosis not present

## 2023-02-16 DIAGNOSIS — G9589 Other specified diseases of spinal cord: Secondary | ICD-10-CM | POA: Diagnosis not present

## 2023-02-16 DIAGNOSIS — M4802 Spinal stenosis, cervical region: Secondary | ICD-10-CM | POA: Diagnosis not present

## 2023-02-16 DIAGNOSIS — E519 Thiamine deficiency, unspecified: Secondary | ICD-10-CM | POA: Diagnosis not present

## 2023-02-16 DIAGNOSIS — I110 Hypertensive heart disease with heart failure: Secondary | ICD-10-CM | POA: Diagnosis not present

## 2023-02-16 DIAGNOSIS — M5126 Other intervertebral disc displacement, lumbar region: Secondary | ICD-10-CM | POA: Diagnosis not present

## 2023-02-16 DIAGNOSIS — I509 Heart failure, unspecified: Secondary | ICD-10-CM | POA: Diagnosis not present

## 2023-02-16 DIAGNOSIS — L570 Actinic keratosis: Secondary | ICD-10-CM | POA: Diagnosis not present

## 2023-02-16 DIAGNOSIS — J4489 Other specified chronic obstructive pulmonary disease: Secondary | ICD-10-CM | POA: Diagnosis not present

## 2023-02-16 DIAGNOSIS — I2510 Atherosclerotic heart disease of native coronary artery without angina pectoris: Secondary | ICD-10-CM | POA: Diagnosis not present

## 2023-02-16 DIAGNOSIS — E1142 Type 2 diabetes mellitus with diabetic polyneuropathy: Secondary | ICD-10-CM | POA: Diagnosis not present

## 2023-02-16 DIAGNOSIS — M48062 Spinal stenosis, lumbar region with neurogenic claudication: Secondary | ICD-10-CM | POA: Diagnosis not present

## 2023-02-16 DIAGNOSIS — I4892 Unspecified atrial flutter: Secondary | ICD-10-CM | POA: Diagnosis not present

## 2023-02-16 DIAGNOSIS — M6259 Muscle wasting and atrophy, not elsewhere classified, multiple sites: Secondary | ICD-10-CM | POA: Diagnosis not present

## 2023-02-16 DIAGNOSIS — G250 Essential tremor: Secondary | ICD-10-CM | POA: Diagnosis not present

## 2023-02-16 DIAGNOSIS — I4891 Unspecified atrial fibrillation: Secondary | ICD-10-CM | POA: Diagnosis not present

## 2023-02-16 DIAGNOSIS — G4733 Obstructive sleep apnea (adult) (pediatric): Secondary | ICD-10-CM | POA: Diagnosis not present

## 2023-02-16 DIAGNOSIS — M479 Spondylosis, unspecified: Secondary | ICD-10-CM | POA: Diagnosis not present

## 2023-02-16 DIAGNOSIS — J439 Emphysema, unspecified: Secondary | ICD-10-CM | POA: Diagnosis not present

## 2023-02-16 DIAGNOSIS — R1314 Dysphagia, pharyngoesophageal phase: Secondary | ICD-10-CM | POA: Diagnosis not present

## 2023-02-16 DIAGNOSIS — N281 Cyst of kidney, acquired: Secondary | ICD-10-CM | POA: Diagnosis not present

## 2023-02-16 DIAGNOSIS — I671 Cerebral aneurysm, nonruptured: Secondary | ICD-10-CM | POA: Diagnosis not present

## 2023-02-17 DIAGNOSIS — L03115 Cellulitis of right lower limb: Secondary | ICD-10-CM | POA: Diagnosis not present

## 2023-02-17 DIAGNOSIS — K2100 Gastro-esophageal reflux disease with esophagitis, without bleeding: Secondary | ICD-10-CM | POA: Diagnosis not present

## 2023-02-17 DIAGNOSIS — K7581 Nonalcoholic steatohepatitis (NASH): Secondary | ICD-10-CM | POA: Diagnosis not present

## 2023-02-17 DIAGNOSIS — E119 Type 2 diabetes mellitus without complications: Secondary | ICD-10-CM | POA: Diagnosis not present

## 2023-02-17 DIAGNOSIS — Z794 Long term (current) use of insulin: Secondary | ICD-10-CM | POA: Diagnosis not present

## 2023-02-17 DIAGNOSIS — K7460 Unspecified cirrhosis of liver: Secondary | ICD-10-CM | POA: Diagnosis not present

## 2023-02-17 DIAGNOSIS — I739 Peripheral vascular disease, unspecified: Secondary | ICD-10-CM | POA: Diagnosis not present

## 2023-02-17 DIAGNOSIS — I872 Venous insufficiency (chronic) (peripheral): Secondary | ICD-10-CM | POA: Diagnosis not present

## 2023-02-17 DIAGNOSIS — K219 Gastro-esophageal reflux disease without esophagitis: Secondary | ICD-10-CM | POA: Diagnosis not present

## 2023-02-22 DIAGNOSIS — M4802 Spinal stenosis, cervical region: Secondary | ICD-10-CM | POA: Diagnosis not present

## 2023-02-22 DIAGNOSIS — R1314 Dysphagia, pharyngoesophageal phase: Secondary | ICD-10-CM | POA: Diagnosis not present

## 2023-02-22 DIAGNOSIS — L570 Actinic keratosis: Secondary | ICD-10-CM | POA: Diagnosis not present

## 2023-02-22 DIAGNOSIS — E1142 Type 2 diabetes mellitus with diabetic polyneuropathy: Secondary | ICD-10-CM | POA: Diagnosis not present

## 2023-02-22 DIAGNOSIS — J449 Chronic obstructive pulmonary disease, unspecified: Secondary | ICD-10-CM | POA: Diagnosis not present

## 2023-02-22 DIAGNOSIS — G9589 Other specified diseases of spinal cord: Secondary | ICD-10-CM | POA: Diagnosis not present

## 2023-02-22 DIAGNOSIS — M51369 Other intervertebral disc degeneration, lumbar region without mention of lumbar back pain or lower extremity pain: Secondary | ICD-10-CM | POA: Diagnosis not present

## 2023-02-22 DIAGNOSIS — M5126 Other intervertebral disc displacement, lumbar region: Secondary | ICD-10-CM | POA: Diagnosis not present

## 2023-02-22 DIAGNOSIS — M1990 Unspecified osteoarthritis, unspecified site: Secondary | ICD-10-CM | POA: Diagnosis not present

## 2023-02-22 DIAGNOSIS — G250 Essential tremor: Secondary | ICD-10-CM | POA: Diagnosis not present

## 2023-02-22 DIAGNOSIS — M48062 Spinal stenosis, lumbar region with neurogenic claudication: Secondary | ICD-10-CM | POA: Diagnosis not present

## 2023-02-22 DIAGNOSIS — M479 Spondylosis, unspecified: Secondary | ICD-10-CM | POA: Diagnosis not present

## 2023-02-22 DIAGNOSIS — I4891 Unspecified atrial fibrillation: Secondary | ICD-10-CM | POA: Diagnosis not present

## 2023-02-22 DIAGNOSIS — I110 Hypertensive heart disease with heart failure: Secondary | ICD-10-CM | POA: Diagnosis not present

## 2023-02-22 DIAGNOSIS — I671 Cerebral aneurysm, nonruptured: Secondary | ICD-10-CM | POA: Diagnosis not present

## 2023-02-22 DIAGNOSIS — N281 Cyst of kidney, acquired: Secondary | ICD-10-CM | POA: Diagnosis not present

## 2023-02-22 DIAGNOSIS — G8929 Other chronic pain: Secondary | ICD-10-CM | POA: Diagnosis not present

## 2023-02-22 DIAGNOSIS — J439 Emphysema, unspecified: Secondary | ICD-10-CM | POA: Diagnosis not present

## 2023-02-22 DIAGNOSIS — J4489 Other specified chronic obstructive pulmonary disease: Secondary | ICD-10-CM | POA: Diagnosis not present

## 2023-02-22 DIAGNOSIS — I509 Heart failure, unspecified: Secondary | ICD-10-CM | POA: Diagnosis not present

## 2023-02-22 DIAGNOSIS — I2510 Atherosclerotic heart disease of native coronary artery without angina pectoris: Secondary | ICD-10-CM | POA: Diagnosis not present

## 2023-02-22 DIAGNOSIS — E519 Thiamine deficiency, unspecified: Secondary | ICD-10-CM | POA: Diagnosis not present

## 2023-02-22 DIAGNOSIS — I4892 Unspecified atrial flutter: Secondary | ICD-10-CM | POA: Diagnosis not present

## 2023-02-22 DIAGNOSIS — M6259 Muscle wasting and atrophy, not elsewhere classified, multiple sites: Secondary | ICD-10-CM | POA: Diagnosis not present

## 2023-02-22 DIAGNOSIS — M5030 Other cervical disc degeneration, unspecified cervical region: Secondary | ICD-10-CM | POA: Diagnosis not present

## 2023-02-22 DIAGNOSIS — G4733 Obstructive sleep apnea (adult) (pediatric): Secondary | ICD-10-CM | POA: Diagnosis not present

## 2023-02-23 DIAGNOSIS — E119 Type 2 diabetes mellitus without complications: Secondary | ICD-10-CM | POA: Diagnosis not present

## 2023-02-23 DIAGNOSIS — I83019 Varicose veins of right lower extremity with ulcer of unspecified site: Secondary | ICD-10-CM | POA: Diagnosis not present

## 2023-02-23 DIAGNOSIS — Z7409 Other reduced mobility: Secondary | ICD-10-CM | POA: Diagnosis not present

## 2023-02-23 DIAGNOSIS — L97919 Non-pressure chronic ulcer of unspecified part of right lower leg with unspecified severity: Secondary | ICD-10-CM | POA: Diagnosis not present

## 2023-02-23 DIAGNOSIS — S90421S Blister (nonthermal), right great toe, sequela: Secondary | ICD-10-CM | POA: Diagnosis not present

## 2023-02-23 DIAGNOSIS — I872 Venous insufficiency (chronic) (peripheral): Secondary | ICD-10-CM | POA: Diagnosis not present

## 2023-02-23 DIAGNOSIS — Z789 Other specified health status: Secondary | ICD-10-CM | POA: Diagnosis not present

## 2023-03-01 ENCOUNTER — Other Ambulatory Visit: Payer: Self-pay | Admitting: Internal Medicine

## 2023-03-01 DIAGNOSIS — E1122 Type 2 diabetes mellitus with diabetic chronic kidney disease: Secondary | ICD-10-CM

## 2023-03-01 MED ORDER — DEXCOM G7 SENSOR MISC: 3.0000 | 4 refills | Status: DC

## 2023-03-02 DIAGNOSIS — R1314 Dysphagia, pharyngoesophageal phase: Secondary | ICD-10-CM | POA: Diagnosis not present

## 2023-03-02 DIAGNOSIS — L570 Actinic keratosis: Secondary | ICD-10-CM | POA: Diagnosis not present

## 2023-03-02 DIAGNOSIS — I2510 Atherosclerotic heart disease of native coronary artery without angina pectoris: Secondary | ICD-10-CM | POA: Diagnosis not present

## 2023-03-02 DIAGNOSIS — N281 Cyst of kidney, acquired: Secondary | ICD-10-CM | POA: Diagnosis not present

## 2023-03-02 DIAGNOSIS — G9589 Other specified diseases of spinal cord: Secondary | ICD-10-CM | POA: Diagnosis not present

## 2023-03-02 DIAGNOSIS — I4891 Unspecified atrial fibrillation: Secondary | ICD-10-CM | POA: Diagnosis not present

## 2023-03-02 DIAGNOSIS — M6259 Muscle wasting and atrophy, not elsewhere classified, multiple sites: Secondary | ICD-10-CM | POA: Diagnosis not present

## 2023-03-02 DIAGNOSIS — M1990 Unspecified osteoarthritis, unspecified site: Secondary | ICD-10-CM | POA: Diagnosis not present

## 2023-03-02 DIAGNOSIS — G4733 Obstructive sleep apnea (adult) (pediatric): Secondary | ICD-10-CM | POA: Diagnosis not present

## 2023-03-02 DIAGNOSIS — I4892 Unspecified atrial flutter: Secondary | ICD-10-CM | POA: Diagnosis not present

## 2023-03-02 DIAGNOSIS — M479 Spondylosis, unspecified: Secondary | ICD-10-CM | POA: Diagnosis not present

## 2023-03-02 DIAGNOSIS — J439 Emphysema, unspecified: Secondary | ICD-10-CM | POA: Diagnosis not present

## 2023-03-02 DIAGNOSIS — M48062 Spinal stenosis, lumbar region with neurogenic claudication: Secondary | ICD-10-CM | POA: Diagnosis not present

## 2023-03-02 DIAGNOSIS — M4802 Spinal stenosis, cervical region: Secondary | ICD-10-CM | POA: Diagnosis not present

## 2023-03-02 DIAGNOSIS — I671 Cerebral aneurysm, nonruptured: Secondary | ICD-10-CM | POA: Diagnosis not present

## 2023-03-02 DIAGNOSIS — J4489 Other specified chronic obstructive pulmonary disease: Secondary | ICD-10-CM | POA: Diagnosis not present

## 2023-03-02 DIAGNOSIS — I509 Heart failure, unspecified: Secondary | ICD-10-CM | POA: Diagnosis not present

## 2023-03-02 DIAGNOSIS — I110 Hypertensive heart disease with heart failure: Secondary | ICD-10-CM | POA: Diagnosis not present

## 2023-03-02 DIAGNOSIS — G250 Essential tremor: Secondary | ICD-10-CM | POA: Diagnosis not present

## 2023-03-02 DIAGNOSIS — E1142 Type 2 diabetes mellitus with diabetic polyneuropathy: Secondary | ICD-10-CM | POA: Diagnosis not present

## 2023-03-02 DIAGNOSIS — M5126 Other intervertebral disc displacement, lumbar region: Secondary | ICD-10-CM | POA: Diagnosis not present

## 2023-03-02 DIAGNOSIS — G8929 Other chronic pain: Secondary | ICD-10-CM | POA: Diagnosis not present

## 2023-03-02 DIAGNOSIS — E519 Thiamine deficiency, unspecified: Secondary | ICD-10-CM | POA: Diagnosis not present

## 2023-03-06 DIAGNOSIS — M479 Spondylosis, unspecified: Secondary | ICD-10-CM | POA: Diagnosis not present

## 2023-03-06 DIAGNOSIS — I4892 Unspecified atrial flutter: Secondary | ICD-10-CM | POA: Diagnosis not present

## 2023-03-06 DIAGNOSIS — I4891 Unspecified atrial fibrillation: Secondary | ICD-10-CM | POA: Diagnosis not present

## 2023-03-06 DIAGNOSIS — N281 Cyst of kidney, acquired: Secondary | ICD-10-CM | POA: Diagnosis not present

## 2023-03-06 DIAGNOSIS — G8929 Other chronic pain: Secondary | ICD-10-CM | POA: Diagnosis not present

## 2023-03-06 DIAGNOSIS — G250 Essential tremor: Secondary | ICD-10-CM | POA: Diagnosis not present

## 2023-03-06 DIAGNOSIS — J439 Emphysema, unspecified: Secondary | ICD-10-CM | POA: Diagnosis not present

## 2023-03-06 DIAGNOSIS — M6259 Muscle wasting and atrophy, not elsewhere classified, multiple sites: Secondary | ICD-10-CM | POA: Diagnosis not present

## 2023-03-06 DIAGNOSIS — L570 Actinic keratosis: Secondary | ICD-10-CM | POA: Diagnosis not present

## 2023-03-06 DIAGNOSIS — M48062 Spinal stenosis, lumbar region with neurogenic claudication: Secondary | ICD-10-CM | POA: Diagnosis not present

## 2023-03-06 DIAGNOSIS — E1142 Type 2 diabetes mellitus with diabetic polyneuropathy: Secondary | ICD-10-CM | POA: Diagnosis not present

## 2023-03-06 DIAGNOSIS — J4489 Other specified chronic obstructive pulmonary disease: Secondary | ICD-10-CM | POA: Diagnosis not present

## 2023-03-06 DIAGNOSIS — M5126 Other intervertebral disc displacement, lumbar region: Secondary | ICD-10-CM | POA: Diagnosis not present

## 2023-03-06 DIAGNOSIS — R1314 Dysphagia, pharyngoesophageal phase: Secondary | ICD-10-CM | POA: Diagnosis not present

## 2023-03-06 DIAGNOSIS — E519 Thiamine deficiency, unspecified: Secondary | ICD-10-CM | POA: Diagnosis not present

## 2023-03-06 DIAGNOSIS — I509 Heart failure, unspecified: Secondary | ICD-10-CM | POA: Diagnosis not present

## 2023-03-06 DIAGNOSIS — M4802 Spinal stenosis, cervical region: Secondary | ICD-10-CM | POA: Diagnosis not present

## 2023-03-06 DIAGNOSIS — I2510 Atherosclerotic heart disease of native coronary artery without angina pectoris: Secondary | ICD-10-CM | POA: Diagnosis not present

## 2023-03-06 DIAGNOSIS — G4733 Obstructive sleep apnea (adult) (pediatric): Secondary | ICD-10-CM | POA: Diagnosis not present

## 2023-03-06 DIAGNOSIS — G9589 Other specified diseases of spinal cord: Secondary | ICD-10-CM | POA: Diagnosis not present

## 2023-03-06 DIAGNOSIS — I671 Cerebral aneurysm, nonruptured: Secondary | ICD-10-CM | POA: Diagnosis not present

## 2023-03-06 DIAGNOSIS — I110 Hypertensive heart disease with heart failure: Secondary | ICD-10-CM | POA: Diagnosis not present

## 2023-03-06 DIAGNOSIS — M1990 Unspecified osteoarthritis, unspecified site: Secondary | ICD-10-CM | POA: Diagnosis not present

## 2023-03-08 ENCOUNTER — Other Ambulatory Visit: Payer: Self-pay | Admitting: Cardiology

## 2023-03-13 ENCOUNTER — Ambulatory Visit: Payer: Medicare Other | Admitting: Internal Medicine

## 2023-03-13 DIAGNOSIS — L97512 Non-pressure chronic ulcer of other part of right foot with fat layer exposed: Secondary | ICD-10-CM | POA: Diagnosis not present

## 2023-03-13 DIAGNOSIS — E11621 Type 2 diabetes mellitus with foot ulcer: Secondary | ICD-10-CM | POA: Diagnosis not present

## 2023-03-13 DIAGNOSIS — Z7985 Long-term (current) use of injectable non-insulin antidiabetic drugs: Secondary | ICD-10-CM | POA: Diagnosis not present

## 2023-03-13 DIAGNOSIS — F17200 Nicotine dependence, unspecified, uncomplicated: Secondary | ICD-10-CM | POA: Diagnosis not present

## 2023-03-13 DIAGNOSIS — Z794 Long term (current) use of insulin: Secondary | ICD-10-CM | POA: Diagnosis not present

## 2023-03-13 DIAGNOSIS — F17210 Nicotine dependence, cigarettes, uncomplicated: Secondary | ICD-10-CM | POA: Diagnosis not present

## 2023-03-15 DIAGNOSIS — I509 Heart failure, unspecified: Secondary | ICD-10-CM | POA: Diagnosis not present

## 2023-03-15 DIAGNOSIS — M48062 Spinal stenosis, lumbar region with neurogenic claudication: Secondary | ICD-10-CM | POA: Diagnosis not present

## 2023-03-15 DIAGNOSIS — M479 Spondylosis, unspecified: Secondary | ICD-10-CM | POA: Diagnosis not present

## 2023-03-15 DIAGNOSIS — G8929 Other chronic pain: Secondary | ICD-10-CM | POA: Diagnosis not present

## 2023-03-15 DIAGNOSIS — M6259 Muscle wasting and atrophy, not elsewhere classified, multiple sites: Secondary | ICD-10-CM | POA: Diagnosis not present

## 2023-03-15 DIAGNOSIS — M5126 Other intervertebral disc displacement, lumbar region: Secondary | ICD-10-CM | POA: Diagnosis not present

## 2023-03-15 DIAGNOSIS — J439 Emphysema, unspecified: Secondary | ICD-10-CM | POA: Diagnosis not present

## 2023-03-15 DIAGNOSIS — G9589 Other specified diseases of spinal cord: Secondary | ICD-10-CM | POA: Diagnosis not present

## 2023-03-15 DIAGNOSIS — I2510 Atherosclerotic heart disease of native coronary artery without angina pectoris: Secondary | ICD-10-CM | POA: Diagnosis not present

## 2023-03-15 DIAGNOSIS — I110 Hypertensive heart disease with heart failure: Secondary | ICD-10-CM | POA: Diagnosis not present

## 2023-03-15 DIAGNOSIS — R1314 Dysphagia, pharyngoesophageal phase: Secondary | ICD-10-CM | POA: Diagnosis not present

## 2023-03-15 DIAGNOSIS — E519 Thiamine deficiency, unspecified: Secondary | ICD-10-CM | POA: Diagnosis not present

## 2023-03-15 DIAGNOSIS — N281 Cyst of kidney, acquired: Secondary | ICD-10-CM | POA: Diagnosis not present

## 2023-03-15 DIAGNOSIS — M4802 Spinal stenosis, cervical region: Secondary | ICD-10-CM | POA: Diagnosis not present

## 2023-03-15 DIAGNOSIS — I4891 Unspecified atrial fibrillation: Secondary | ICD-10-CM | POA: Diagnosis not present

## 2023-03-15 DIAGNOSIS — E1142 Type 2 diabetes mellitus with diabetic polyneuropathy: Secondary | ICD-10-CM | POA: Diagnosis not present

## 2023-03-15 DIAGNOSIS — I4892 Unspecified atrial flutter: Secondary | ICD-10-CM | POA: Diagnosis not present

## 2023-03-15 DIAGNOSIS — M1990 Unspecified osteoarthritis, unspecified site: Secondary | ICD-10-CM | POA: Diagnosis not present

## 2023-03-15 DIAGNOSIS — J4489 Other specified chronic obstructive pulmonary disease: Secondary | ICD-10-CM | POA: Diagnosis not present

## 2023-03-15 DIAGNOSIS — I671 Cerebral aneurysm, nonruptured: Secondary | ICD-10-CM | POA: Diagnosis not present

## 2023-03-15 DIAGNOSIS — L570 Actinic keratosis: Secondary | ICD-10-CM | POA: Diagnosis not present

## 2023-03-15 DIAGNOSIS — G250 Essential tremor: Secondary | ICD-10-CM | POA: Diagnosis not present

## 2023-03-15 DIAGNOSIS — G4733 Obstructive sleep apnea (adult) (pediatric): Secondary | ICD-10-CM | POA: Diagnosis not present

## 2023-03-17 ENCOUNTER — Telehealth: Payer: Self-pay

## 2023-03-17 MED ORDER — BASAGLAR KWIKPEN 100 UNIT/ML ~~LOC~~ SOPN: 50.0000 [IU] | PEN_INJECTOR | Freq: Every day | SUBCUTANEOUS | 1 refills | Status: DC

## 2023-03-17 NOTE — Telephone Encounter (Signed)
Patient called back and states that on Wednesday EMS had to come out because his blood sugar was running HI on both meters (dexcom and glucometer).  On 03/15/23 at 7 am he took 100 units, 4pm he took 120 units and at 7pm 50 units  When EMS arrived they checked his blood sugar and it was 490 after taking 270 units total. Today his blood sugar was over 300 all day until we talked, he checked it and it was 291. He took 100 units this morning.     Spoke with Dr. Elvera Lennox and she advised that he start on Basaglar 50 units at bedtime and to see him in the office on Monday.  Appt was made and the rx was sent in. He will also be picking up an PAP for Ozempic that will be discussed at his appt.

## 2023-03-17 NOTE — Telephone Encounter (Signed)
Called and left a VM.

## 2023-03-17 NOTE — Addendum Note (Signed)
Addended by: Pollie Meyer on: 03/17/2023 05:24 PM   Modules accepted: Orders

## 2023-03-17 NOTE — Telephone Encounter (Signed)
Patient called and left a VM stating that he has been having some blood sugar readings of 300.

## 2023-03-19 ENCOUNTER — Other Ambulatory Visit: Payer: Self-pay | Admitting: Internal Medicine

## 2023-03-20 ENCOUNTER — Ambulatory Visit: Payer: Medicare Other | Admitting: Internal Medicine

## 2023-03-20 ENCOUNTER — Encounter: Payer: Self-pay | Admitting: Internal Medicine

## 2023-03-20 VITALS — BP 120/70 | HR 70 | Ht 74.0 in | Wt 261.6 lb

## 2023-03-20 DIAGNOSIS — E785 Hyperlipidemia, unspecified: Secondary | ICD-10-CM | POA: Diagnosis not present

## 2023-03-20 DIAGNOSIS — E1122 Type 2 diabetes mellitus with diabetic chronic kidney disease: Secondary | ICD-10-CM | POA: Diagnosis not present

## 2023-03-20 DIAGNOSIS — E278 Other specified disorders of adrenal gland: Secondary | ICD-10-CM

## 2023-03-20 DIAGNOSIS — F17210 Nicotine dependence, cigarettes, uncomplicated: Secondary | ICD-10-CM | POA: Diagnosis not present

## 2023-03-20 DIAGNOSIS — E11621 Type 2 diabetes mellitus with foot ulcer: Secondary | ICD-10-CM | POA: Diagnosis not present

## 2023-03-20 DIAGNOSIS — N1831 Chronic kidney disease, stage 3a: Secondary | ICD-10-CM

## 2023-03-20 DIAGNOSIS — Z794 Long term (current) use of insulin: Secondary | ICD-10-CM

## 2023-03-20 DIAGNOSIS — Z7985 Long-term (current) use of injectable non-insulin antidiabetic drugs: Secondary | ICD-10-CM | POA: Diagnosis not present

## 2023-03-20 DIAGNOSIS — L97512 Non-pressure chronic ulcer of other part of right foot with fat layer exposed: Secondary | ICD-10-CM | POA: Diagnosis not present

## 2023-03-20 LAB — HEMOGLOBIN A1C: Hemoglobin A1C: 8.7

## 2023-03-20 MED ORDER — LANTUS SOLOSTAR 100 UNIT/ML ~~LOC~~ SOPN: 50.0000 [IU] | PEN_INJECTOR | Freq: Every day | SUBCUTANEOUS | 3 refills | Status: DC

## 2023-03-20 NOTE — Patient Instructions (Addendum)
Please start: - Ozempic 0.25 mg weekly x2 weeks, then increase to 0.5 mg weekly  Continue: - U500 insulin - 30 min before meals 100-120 units in the morning  100-120 units in the evening  Please start: - Lantus 50 units daily at bedtime   Try to change the insulin pen.  Try to change to inject insulin in lateral upper thigh.  Please return in 1.5 months.

## 2023-03-20 NOTE — Progress Notes (Unsigned)
Patient ID: [name], male   DOB: 1956-01-30, 67 y.o.   MRN: [id number]  HPI: [name] is a 67 y.o.-year-old male, returning for follow-up for DM2, dx in 2012, insulin-dependent since 2019, uncontrolled, with complications (CHF, CAD, Afib/flutter, PAD, CKD, PN). Pt. previously saw Dr. Everardo All, but last visit with me 4 months ago.  Interim history: No increased urination, nausea, chest pain.   He has muscle and joint pain.  She sees the pain clinic.   He also has weakness and walks with a cane - feels this is related to the Covid vaccines. He is not sleeping well at night. He sees wound center for R leg cellulitis. He was on Prednisone last week for 3 days for COPD, last dose 5-6 days ago.  Reviewed HbA1c: 11/16/2022: HbA1c 7.8% Lab Results  Component Value Date   HGBA1C 7.6 (A) 06/28/2022   HGBA1C 7.6 (A) 05/11/2022   HGBA1C 7.7 (A) 11/30/2021   HGBA1C 7.6 (A) 07/29/2021   HGBA1C 8.1 (A) 05/26/2021   HGBA1C 7.6 (A) 11/09/2020   HGBA1C 7.6 (A) 08/20/2020   HGBA1C 8.1 (A) 06/17/2020   HGBA1C 8.7 (A) 04/13/2020   HGBA1C 8.4 (A) 11/21/2019   Previously on: - Trulicity 3 mg weekly - Humalog 0-60 units 1-5x a day, before meals - max amount 270 units/day (!) Per review of Dr. George Hugh notes, time-released insulins caused a rash in the past.  Patient does not remember this at today's visit. He tried [place] but this caused dehydration. Metformin dose is limited by abdominal pain. Trulicity dose is limited by nausea. In the past, he has been missing insulin 2 to 3 days at that time per review of Dr. George Hugh notes.  Then on: - Trulicity 3 mg weekly - NovoLog >> Aspart 40-50 units 3x a day, before meals - Basaglar 70 >> 75 units at bedtime  At the last visit he was on: - Trulicity 3 mg weekly - U500 insulin - missing doses, takes it mostly after the meals, or misses 120-140 >> actually taking 60-80  units of insulin in the morning   70-100 >> 90-110 >> actually taking  40-60 units in the evening  Currently on: - U500 insulin - 30 min before meals 100-120 units in the morning  100-120 units in the evening He was not able to start Basaglar 50 units daily at bedtime as this is not covered for him.  Pt checks his sugars >4x a day with his Dexcom CGM:   Previously:   Previously:    Lowest sugar was 70s-80s >> 50s; he has hypoglycemia awareness at 110.  Highest sugar was 600 ...>> 300s  Glucometer: ReliOn  Pt's meals are: - Breakfast: sausage biscuit - Lunch: banana sandwich, leftovers - Dinner: meat and veggies, seldom cake - Snacks: occas. - pasta, etc.  - + CKD, last BUN/creatinine:  12/22/2022: 25/1.54, GFR 50, glucose 155 Lab Results  Component Value Date   BUN 28 (H) 08/01/2022   BUN 22 03/01/2022   CREATININE 1.25 08/01/2022   CREATININE 1.50 03/01/2022   Lab Results  Component Value Date   MICRALBCREAT >300 08/01/2022  On diltiazem, Cozaar 50 mg daily.  -+ HL; last set of lipids: 12/22/2022: 95/286/24/37 Lab Results  Component Value Date   CHOL 154 08/01/2022   HDL 23 (L) 08/01/2022   LDLCALC 63 08/01/2022   LDLDIRECT 99.9 09/08/2011   TRIG 437 (H) 08/01/2022   CHOLHDL 6.7 (H) 08/01/2022  Off pravastatin 80 mg daily - stopped 2/2  mm weakness.  He was started on Crestor 10 mg daily 10/2022.  - last eye exam was in 2024. Reportedly No DR. Incipient cataracts.  - + numbness and tingling in his feet. Also, significant pain.  Last foot exam 11/25/2022.  On Neurontin 300 mg 3 capsules twice daily. He sees pain management. He sees podiatry.  Latest TSH 5.159 on 12/22/2022. He also has a history of HTN, OSA, COPD, GERD, DDD, gout. He is still smoking.  ROS: + see HPI  Past Medical History:  Diagnosis Date   Actinic keratosis    Arthritis    lower back   Asthma    Atrial fibrillation [facility])    Atrial flutter (HCC)    s/p CTI ablation by Dr Johney Frame   Brain aneurysm 2009   questionable. A follow up CTA in 2009 showed no  evidence of   Chronic back pain    DDD/stenosis   Colon polyps    9 polyps removed 10/13/11   Complication of anesthesia 09/11/2012   slow to awaken after ablation   COPD (chronic obstructive pulmonary disease) (HCC)    Diabetes mellitus    takes Metformin and Glimepiride daily   Emphysema    GERD (gastroesophageal reflux disease)    takes Omeprazole bid   Heart failure (HCC)    History of shingles    HLD (hyperlipidemia)    takes Pravastatin daily   HTN (hypertension)    takes Prinizide daily   Obesity    OSA (obstructive sleep apnea)    not always using cpap   Overdose 2009   unintentional Flecanide overdose   Peripheral neuropathy    Short-term memory loss    Tobacco abuse    Past Surgical History:  Procedure Laterality Date   ANTERIOR CERVICAL DECOMP/DISCECTOMY FUSION  01/04/2012   Procedure: ANTERIOR CERVICAL DECOMPRESSION/DISCECTOMY FUSION 1 LEVEL/HARDWARE REMOVAL;  Surgeon: Cristi Loron, MD;  Location: [facility]URO ORS;  Service: Neurosurgery;  Laterality: N/A;  explore cervical fusion Cervical six - seven  with removal of codman plate anterior cervical decompression with fusion interbody prothesis plating and bonegraft   APPENDECTOMY  2-58yrs ago   ATRIAL FIBRILLATION ABLATION N/A 09/11/2012   PT DID NOT HAVE AN ATRIAL FIBRILLATION ABLATION IN 2014!  ATRIAL FLUTTER ABLATION ONLY   ATRIAL FLUTTER ABLATION  09/11/2012   CTI ablation by Dr Johney Frame   BIOPSY  11/07/2019   Procedure: BIOPSY;  Surgeon: Rachael Fee, MD;  Location: [facility] ENDOSCOPY;  Service: Endoscopy;;   CARDIAC CATHETERIZATION  2008   no significant CAD   CARDIOVERSION  05/05/2011   Procedure: CARDIOVERSION;  Surgeon: Lewayne Bunting, MD;  Location: [facility] ENDOSCOPY;  Service: Cardiovascular;  Laterality: N/A;   CARDIOVERSION Bilateral 07/26/2012   Procedure: CARDIOVERSION;  Surgeon: Rollene Rotunda, MD;  Location: [facility] ENDOSCOPY;  Service: Cardiovascular;  Laterality: Bilateral;   CARPAL TUNNEL RELEASE  99/2000    bilateral   COLONOSCOPY WITH PROPOFOL N/A 12/13/2012   Procedure: COLONOSCOPY WITH PROPOFOL;  Surgeon: Rachael Fee, MD;  Location: [facility] ENDOSCOPY;  Service: Endoscopy;  Laterality: N/A;   ELECTROPHYSIOLOGIC STUDY N/A 04/21/2015   Procedure: Atrial Fibrillation Ablation;  Surgeon: Hillis Range, MD;  Location: [facility] INVASIVE CV LAB;  Service: Cardiovascular;  Laterality: N/A;   ESOPHAGOGASTRODUODENOSCOPY (EGD) WITH PROPOFOL N/A 11/07/2019   Procedure: ESOPHAGOGASTRODUODENOSCOPY (EGD) WITH PROPOFOL;  Surgeon: Rachael Fee, MD;  Location: [facility] ENDOSCOPY;  Service: Endoscopy;  Laterality: N/A;   HERNIA REPAIR     KNEE ARTHROSCOPY WITH MEDIAL MENISECTOMY Left  04/23/2021   Procedure: LEFT KNEE ARTHROSCOPY WITH PARTIAL MEDIAL MENISCECTOMY;  Surgeon: Eldred Manges, MD;  Location: [facility] OR;  Service: Orthopedics;  Laterality: Left;   KNEE SURGERY  6-57yrs ago   left   LEFT HEART CATH AND CORONARY ANGIOGRAPHY N/A 09/19/2016   Procedure: Left Heart Cath and Coronary Angiography;  Surgeon: Lyn Records, MD;  Location: [facility] INVASIVE CV LAB;  Service: Cardiovascular;  Laterality: N/A;   Left inguinal hernia repair     as a child   NASAL SEPTOPLASTY W/ TURBINOPLASTY Bilateral 02/19/2014   Procedure: NASAL SEPTOPLASTY WITH BILATERAL TURBINATE REDUCTION;  Surgeon: Flo Shanks, MD;  Location: [facility] OR;  Service: ENT;  Laterality: Bilateral;   NECK SURGERY  39yrs ago   right shoulder surgery  4-34yrs ago   cyst removed   TEE WITHOUT CARDIOVERSION  05/05/2011   Procedure: TRANSESOPHAGEAL ECHOCARDIOGRAM (TEE);  Surgeon: Lewayne Bunting, MD;  Location: [facility] ENDOSCOPY;  Service: Cardiovascular;  Laterality: N/A;   TEE WITHOUT CARDIOVERSION N/A 04/21/2015   Procedure: TRANSESOPHAGEAL ECHOCARDIOGRAM (TEE);  Surgeon: Quintella Reichert, MD;  Location: [facility] ENDOSCOPY;  Service: Cardiovascular;  Laterality: N/A;   THROAT SURGERY  4-24yrs ago   "thought " it was cancer but came back not   TONSILLECTOMY     Social History    Socioeconomic History   Marital status: Married    Spouse name: Not on file   Number of children: 2   Years of education: Not on file   Highest education level: Not on file  Occupational History   Occupation: Curator   Tobacco Use   Smoking status: Every Day    Current packs/day: 1.00    Average packs/day: 1 pack/day for 49.0 years (49.0 ttl pk-yrs)    Types: Cigarettes, E-cigarettes   Smokeless tobacco: Never   Tobacco comments:    Smoking almost a pack per day Patient is vaping daily.  09/28/2022 am a pack of cigs in 4days 01/05/23  Vaping Use   Vaping status: Every Day  Substance and Sexual Activity   Alcohol use: Not Currently    Alcohol/week: 0.0 standard drinks of alcohol    Comment: 12/05/17 pt stated he has drinked 1/2 drink in 3 years   Drug use: No   Sexual activity: Yes  Other Topics Concern   Not on file  Social History Narrative   Daily caffeine(Mountain Dew) Lives in [place] with spouse.Unemployed due to chronic back/ leg pain         Are you right handed or left handed? Left Handed    Are you currently employed ? No    What is your current occupation? No   Do you live at home alone? no   Who lives with you? Lives with wife and 3 doggies   What type of home do you live in: 1 story or 2 story? Lives in one story home       Social Determinants of Health   Financial Resource Strain: Not on file  Food Insecurity: Not on file  Transportation Needs: Not on file  Physical Activity: Not on file  Stress: Not on file  Social Connections: Not on file  Intimate Partner Violence: Not on file   Current Outpatient Medications on File Prior to Visit  Medication Sig Dispense Refill   albuterol (PROVENTIL) (2.5 MG/3ML) 0.083% nebulizer solution Take 3 mLs (2.5 mg total) by nebulization every 6 (six) hours as needed for wheezing or shortness of breath. 75 mL 12  albuterol (VENTOLIN HFA) 108 (90 Base) MCG/ACT inhaler INHALE 1-2 PUFFS INTO THE LUNGS EVERY 6  HOURS AS NEEDED FOR WHEEZING OR SHORTNESS OF BREATH 8.5 g 5   b complex vitamins tablet Take 1 tablet by mouth daily.      bisoprolol (ZEBETA) 10 MG tablet Take 1 tablet (10 mg total) by mouth daily. 90 tablet 1   clobetasol cream (TEMOVATE) 0.05 % Apply 1 Application topically 2 (two) times daily. 60 g 2   clotrimazole-betamethasone (LOTRISONE) cream Apply 1 Application topically daily. 45 g 5   colchicine 0.6 MG tablet TAKE 2 TABLETS BY MOUTH AT ONCE. THEN TAKE 1 TABLET 1 HOUR LATER. WAIT 2 DAYS, THEN BEGIN TAKING 1 TABLET DAILY TO PREVENT ACUTE GOUT. 33 tablet 2   Continuous Blood Gluc Transmit (DEXCOM G6 TRANSMITTER) MISC USE AS DIRECTED 1 each 3   Continuous Glucose Receiver (DEXCOM G7 RECEIVER) DEVI See admin instructions.     Continuous Glucose Sensor (DEXCOM G7 SENSOR) MISC 3 each by Does not apply route every 30 (thirty) days. Apply 1 sensor every 10 days 9 each 4   diltiazem (CARDIZEM CD) 120 MG 24 hr capsule TAKE 1 CAPSULE BY MOUTH DAILY 90 capsule 0   famotidine (PEPCID) 20 MG tablet One after supper 30 tablet 11   febuxostat (ULORIC) 40 MG tablet TAKE 1 TABLET BY MOUTH DAILY 30 tablet 3   Fluticasone-Umeclidin-Vilant (TRELEGY ELLIPTA) 100-62.5-25 MCG/ACT AEPB Inhale 1 puff into the lungs every morning. 6 each 0   Fluticasone-Umeclidin-Vilant (TRELEGY ELLIPTA) 100-62.5-25 MCG/ACT AEPB One click each am 28 each 5   gabapentin (NEURONTIN) 300 MG capsule TAKE 3 CAPSULES BY MOUTH THREE TIMES DAILY FOR NEUROPATHY 540 capsule 2   Glucagon 3 MG/DOSE POWD Place 3 mg into the nose once as needed for up to 1 dose. 1 each 11   Insulin Glargine (BASAGLAR KWIKPEN) 100 UNIT/ML Inject 50 Units into the skin daily. 30 mL 1   Insulin Pen Needle (EASY COMFORT PEN NEEDLES) 31G X 5 MM MISC USE 4 TIMES DAILY AS DIRECTED 300 each 3   insulin regular human CONCENTRATED (HUMULIN R U-500 KWIKPEN) 500 UNIT/ML KwikPen Inject 260 Units into the skin daily. Use up to 250-260 units of insulin daily as advised 48 mL  1   ketoconazole (NIZORAL) 2 % cream Apply 1 Application topically daily. 60 g 2   losartan (COZAAR) 50 MG tablet TAKE 1 TABLET BY MOUTH DAILY 90 tablet 4   metolazone (ZAROXOLYN) 2.5 MG tablet TAKE ONE TABLET BY MOUTH ON TUESDAYS AND THURSDAYS 30 MINUTES BEFORE TAKING TORSEMIDE AS DIRECTED 8 tablet 0   mupirocin ointment (BACTROBAN) 2 % Ply small amount to effected area twice daily for 10 days. 22 g 1   nitroGLYCERIN (NITROSTAT) 0.4 MG SL tablet Place 1 tablet (0.4 mg total) under the tongue every 5 (five) minutes x 3 doses as needed for chest pain. 25 tablet 3   oxyCODONE-acetaminophen (PERCOCET) 10-325 MG tablet Take 1.5 tablets by mouth in the morning, at noon, in the evening, and at bedtime.     pantoprazole (PROTONIX) 40 MG tablet Take 1 tablet (40 mg total) by mouth daily. Take 30-60 min before first meal of the day (Patient not taking: Reported on 01/05/2023) 30 tablet 11   potassium chloride SA (KLOR-CON M) 20 MEQ tablet TAKE 1 TABLET BY MOUTH DAILY. TAKE AN EXTRA TABLET ON TUESDAY AND THURSDAY 30 tablet 6   predniSONE (DELTASONE) 10 MG tablet 2 daily until breathing/cough better then 1 x  3 days and stop     primidone (MYSOLINE) 50 MG tablet Take 0.5 tablets (25 mg total) by mouth daily. 45 tablet 3   rosuvastatin (CRESTOR) 10 MG tablet Take 1 tablet (10 mg total) by mouth daily. 90 tablet 1   sulfamethoxazole-trimethoprim (BACTRIM DS) 800-160 MG tablet Take 1 tablet by mouth 2 (two) times daily. (Patient not taking: Reported on 01/05/2023) 28 tablet 0   tacrolimus (PROTOPIC) 0.1 % ointment Apply topically 2 (two) times daily. 100 g 2   tamsulosin (FLOMAX) 0.4 MG CAPS capsule TAKE 1 CAPSULE BY MOUTH EACH NIGHT AT BEDTIME 30 capsule 11   torsemide (DEMADEX) 20 MG tablet Take 2 tablets in the am and 1 tablet in the pm 270 tablet 2   triamcinolone cream (KENALOG) 0.1 % Apply 1 Application topically daily as needed. 453 g 2   TRULICITY 3 MG/0.5ML SOPN INJECT 3 MGS SUBCUTANEOUSLY ONCE WEEKLY 6 mL  3   VITAMIN A PO Take 2,400 mcg by mouth daily.      Vitamin D, Ergocalciferol, (DRISDOL) 1.25 MG (50000 UNIT) CAPS capsule Take 1 capsule (50,000 Units total) by mouth every 7 (seven) days. 12 capsule 3   warfarin (COUMADIN) 5 MG tablet Take 1/2 to 1 tablet daily or as directed 70 tablet 1   No current facility-administered medications on file prior to visit.   Allergies  Allergen Reactions   Adhesive [Tape]     itching   Latex Itching    When tape is on the skin too long skin gets red & itching   Family History  Problem Relation Age of Onset   Diabetes Mother    Heart disease Father    Lung cancer Father    Diabetes Maternal Grandmother    Colon cancer Neg Hx    Anesthesia problems Neg Hx    Hypotension Neg Hx    Malignant hyperthermia Neg Hx    Pseudochol deficiency Neg Hx    Esophageal cancer Neg Hx    Pancreatic cancer Neg Hx    Stomach cancer Neg Hx    PE: BP 120/70   Pulse 70   Ht 6\' 2"  (1.88 m)   Wt 261 lb 9.6 oz (118.7 kg)   SpO2 95%   BMI 33.59 kg/m  Wt Readings from Last 3 Encounters:  03/20/23 261 lb 9.6 oz (118.7 kg)  01/05/23 236 lb (107 kg)  11/16/22 255 lb 6.4 oz (115.8 kg)   Constitutional: overweight, in NAD, walks with a cane Eyes: no exophthalmos ENT: no thyromegaly, no cervical lymphadenopathy Cardiovascular: RRR, No MRG Respiratory:  +wheezing B lungs Musculoskeletal: no deformities Skin: + rash B shins - ?NLD; also cigarette burns on bilateral legs as he dropped his cigarette while sleeping Neurological: +  tremor with outstretched hands  ASSESSMENT: 1. DM2, insulin-dependent, uncontrolled, with complications - CHF - A fib/flutter - PAD - CKD stage III - PN - CAD  2. HL - fatty liver  3.  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 Patient with longstanding, uncontrolled, insulin resistant type 2 diabetes, on injectable antidiabetic regimen with GLP-1 receptor agonist and U-500 insulin with still very poor control.   At last visit, HbA1c was higher, at 7.8%, but sugars worsened afterwards.  I advised him to add Basaglar at last visit, but he did not start this yet.  He contacted [place] before the weekend with very high blood sugars.  I recommended Basaglar and a prescription was sent to the pharmacy.  At that time, he was unfortunately off Trulicity.  He previously could not tolerate a higher dose than 3 mg due to GI side effects. -At last visit, he was occasionally taking the U-500 insulin after meals and occasionally also skipping it completely as he was afraid of dropping his blood sugars too low if the sugars were at target before meals.  I strongly advised him to take the U-500 insulin 30 minutes before meal if he was planning to eat. - CBG 395 today in the clinic CGM interpretation: -At today's visit, we reviewed his CGM downloads: It appears that 4% of values are in target range (goal >70%), while 96% are higher than 180 (goal <25%), and 0% are lower than 70 (goal <4%).  The calculated average blood sugar is 260.  The projected HbA1c for the next 3 months (GMI) is 9.5%. -Reviewing the CGM trends, sugars are very high, fluctuating almost entirely of the target range.  He does not feel that his insulin is expired and does not keep the pens out of the fridge.  He injects in the mid thigh and I advised him to inject in the lateral upper thigh, since he mentions that injecting in the abdomen causes burning.  I also advised him to keep the pen needle in for 10 seconds, as he is not taking it out immediately after the injection.  He does not feel that insulin is coming out as he takes the needle out.  However, he mentions that he has to dial up more insulin on the pen to be able to obtain the desired amount and I am not sure if the pen in use is not effective.  I advised him to change the pen.  Will also add Lantus 50 units at bedtime, and we gave him a sample of Ozempic.  We also gave him paperwork for the patient assistance  program with [employer] and he will fill it out and bring it back to the clinic.  At that time, we need to repeat this to 1 mg pens, if he can tolerate the medication well. - I suggested to:  Patient Instructions  Please start: - Ozempic 0.25 mg weekly x2 weeks, then increase to 0.5 mg weekly  Continue: - U500 insulin - 30 min before meals 100-120 units in the morning  100-120 units in the evening  Please start: - Lantus 50 units daily at bedtime   Try to change the insulin pen.  Try to change to inject insulin in lateral upper thigh.  Please return in 1.5 months.  - we checked his HbA1c: 8.7% (higher) - advised to check sugars at different times of the day - 4x a day, rotating check times - advised for yearly eye exams >> he is UTD - he has severe peripheral neuropathy for which she sees podiatry and pain management.   - return to clinic in  1.5 moths  2. HL -Reviewed latest lipid panel showing an LDL at  target of less than 55 due to cardiovascular disease, still elevated triglycerides and low HDL: 12/22/2022: 95/286/24/37 -He was taken off pravastatin 80 mg daily due to muscle weakness.  However, he felt that the muscle weakness was more related to the spine. -He was started on Crestor 10 mg daily   3.  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  On the CT scan from 09/22/2022, a comment was made  about nodular thickening of the right adrenal gland which sometimes can be associated with cortisol and (rarely) with aldosterone overproduction.  I am wondering if he has excess cortisol which can explain his insulin resistance, significant weakness, and insomnia.  He is also on multiple medications for blood pressure control. -At today's visit we will recheck  an ACTH and cortisol.  I will also advised the patient to perform a dexamethasone suppression test.  After the results  return, I would suggest an adrenal CT scan.  Orders Placed This Encounter  Procedures   DHEA-Sulfate, Serum   Cortisol   ACTH  Pt. did not stop at the lab.  I will ask him to come back fasting, in AM.  Carlus Pavlov, MD PhD [facility] Endocrinology

## 2023-03-21 ENCOUNTER — Telehealth: Payer: Self-pay

## 2023-03-21 DIAGNOSIS — M51369 Other intervertebral disc degeneration, lumbar region without mention of lumbar back pain or lower extremity pain: Secondary | ICD-10-CM | POA: Diagnosis not present

## 2023-03-21 DIAGNOSIS — M5030 Other cervical disc degeneration, unspecified cervical region: Secondary | ICD-10-CM | POA: Diagnosis not present

## 2023-03-21 DIAGNOSIS — J449 Chronic obstructive pulmonary disease, unspecified: Secondary | ICD-10-CM | POA: Diagnosis not present

## 2023-03-21 NOTE — Telephone Encounter (Signed)
HSample  Medication: Ozempic  Dose: 0.25/0.5 mg Quantity:1 box ZOX:[id number] EXP:05/15/24  Kassity Woodson,RMA  Provided at office visit

## 2023-03-23 ENCOUNTER — Telehealth: Payer: Self-pay

## 2023-03-23 ENCOUNTER — Encounter: Payer: Self-pay | Admitting: Internal Medicine

## 2023-03-23 ENCOUNTER — Other Ambulatory Visit: Payer: Self-pay | Admitting: Cardiology

## 2023-03-23 NOTE — Telephone Encounter (Signed)
I lvm for Mr [name] to call me back

## 2023-03-24 ENCOUNTER — Other Ambulatory Visit: Payer: Self-pay | Admitting: Internal Medicine

## 2023-03-24 DIAGNOSIS — Z794 Long term (current) use of insulin: Secondary | ICD-10-CM

## 2023-03-26 NOTE — Progress Notes (Unsigned)
Cardiology Office Note:   Date:  03/29/2023  ID:  MARLOW GUARD, DOB Jul 03, 1955, MRN [id number] PCP: Audie Pinto, FNP  [facility] Providers Cardiologist:  Rollene Rotunda, MD {  History of Present Illness:   [name] is a 67 y.o. male who presents for who presents for follow up of atrial fibrillation and atrial flutter. He is s/p flutter ablation in 2014 and has actually done well from this standpoint.  He has multiple chronic complaints.  His biggest 1 is just been overall weakness.  I did review neurology notes today for this visit.  He has an essential tremor.  He has numbness tingling some weakening.  It was thought by neurology did be possible cervical and lumbar spine disease with polyneuropathy and question of some B1 deficiency.  Has not been any intervention that was stopped to be of value.  It was suggested he might take primidone.  He has complaints about swallowing difficulties but he is apparently seeing GI.  He has leg weakness and some nonhealing ulcers and he did see vascular surgery last year.  ABIs did not suggest large vessel lower extremity disease.  He is being managed conservatively for this.  He has diabetes it is difficult to manage.   He is not describing new chest pressure, neck or arm discomfort.  He is not describing new palpitations, presyncope or syncope.  He is not reporting PND or orthopnea.  He says his oxygen saturation were 90 to 96% range.  He is otherwise tolerating the meds as listed.  We have done an extensive work-up on him to include a work-up for amyloid which was negative with MRI.  He has had coronary artery calcium noted on CTs but he had a catheterization in 2018 with no obstructive coronary disease.  In fact his coronaries were said to be normal.   He had an echo last in 2021 with normal left ventricular function but left ventricular hypertrophy that was moderate.  He does have elevated pulmonary pressures.  He does have longstanding  COPD.  He does not use oxygen.  He has sleep apnea but does not use CPAP.  He has aortic valve sclerosis and a slight murmur.  He has had carotid bruits but no obstructive disease and only mild plaque.      ROS: Severe back pain.  Studies Reviewed:    EKG:   NA  Risk Assessment/Calculations:    CHA2DS2-VASc Score = 4   This indicates a 4.8% annual risk of stroke. The patient's score is based upon: CHF History: 0 HTN History: 1 Diabetes History: 1 Stroke History: 0 Vascular Disease History: 1 Age Score: 1 Gender Score: 0       Physical Exam:   VS:  BP 116/62 (BP Location: Left Arm, Patient Position: Sitting, Cuff Size: Normal)   Pulse 68   Ht 6\' 2"  (1.88 m)   Wt 264 lb 3.2 oz (119.8 kg)   SpO2 96%   BMI 33.92 kg/m    Wt Readings from Last 3 Encounters:  03/29/23 264 lb 3.2 oz (119.8 kg)  03/20/23 261 lb 9.6 oz (118.7 kg)  01/05/23 236 lb (107 kg)     GEN: Well nourished, well developed in no acute distress NECK: No JVD; No carotid bruits CARDIAC: RRR, 2 out of 6 apical systolic murmur radiating slightly at the aortic outflow tract murmurs, rubs, gallops RESPIRATORY:  Clear to auscultation without rales, wheezing or rhonchi  ABDOMEN: Soft, non-tender, non-distended EXTREMITIES:  Moderate leg edema; No deformity , chronic venous stasis changes.   ASSESSMENT AND PLAN:   Edema/SOB I think he has severe complaints from his poorly controlled diabetes.  There is also other multifactorial issues and clearly some venous stasis changes exacerbated by his inability to walk and his legs being dependent.  He will continue with meds as listed and as needed Zaroxolyn.  I will check a basic metabolic profile today.  COPD (chronic obstructive pulmonary disease) (HCC) He has previously canceled pulmonary appointments.  I will defer management to his primary provider.  Essential hypertension The BP is at target.  No change in therapy.   Sleep apnea He does not use a  CPAP.  Non-insulin dependent type 2 diabetes mellitus (HCC) A1c was he is having this followed by Dr. Elvera Lennox.    PAF (paroxysmal atrial fibrillation) (HCC) He actually tolerates anticoagulation.  No change in therapy.   LVH:  He has moderate LVH he had a negative workup for amyloid at this point.  I will follow-up with an echocardiogram.   LEG PAIN:   I reviewed extensively previous notes from neurology and vascular surgery and there is not a clear etiology other than probably some spinal issues.  He does have follow-up with neurology.    CHEST PAIN:  He is not currently describing chest pain.  He had some coronary calcium but no plaque.  No further testing and he will continue with risk reduction.         Follow up with APP in six months.   Signed, Rollene Rotunda, MD

## 2023-03-29 ENCOUNTER — Ambulatory Visit (INDEPENDENT_AMBULATORY_CARE_PROVIDER_SITE_OTHER): Payer: Medicare Other | Admitting: *Deleted

## 2023-03-29 ENCOUNTER — Ambulatory Visit: Payer: Medicare Other | Attending: Cardiology | Admitting: Cardiology

## 2023-03-29 ENCOUNTER — Encounter: Payer: Self-pay | Admitting: Cardiology

## 2023-03-29 ENCOUNTER — Other Ambulatory Visit: Payer: Medicare Other

## 2023-03-29 VITALS — BP 116/62 | HR 68 | Ht 74.0 in | Wt 264.2 lb

## 2023-03-29 DIAGNOSIS — I517 Cardiomegaly: Secondary | ICD-10-CM | POA: Diagnosis not present

## 2023-03-29 DIAGNOSIS — Z7901 Long term (current) use of anticoagulants: Secondary | ICD-10-CM

## 2023-03-29 DIAGNOSIS — R072 Precordial pain: Secondary | ICD-10-CM | POA: Diagnosis not present

## 2023-03-29 DIAGNOSIS — M79604 Pain in right leg: Secondary | ICD-10-CM

## 2023-03-29 DIAGNOSIS — R0602 Shortness of breath: Secondary | ICD-10-CM

## 2023-03-29 DIAGNOSIS — I10 Essential (primary) hypertension: Secondary | ICD-10-CM

## 2023-03-29 DIAGNOSIS — I4892 Unspecified atrial flutter: Secondary | ICD-10-CM

## 2023-03-29 DIAGNOSIS — I480 Paroxysmal atrial fibrillation: Secondary | ICD-10-CM

## 2023-03-29 DIAGNOSIS — M79605 Pain in left leg: Secondary | ICD-10-CM

## 2023-03-29 LAB — POCT INR: INR: 2.5 (ref 2.0–3.0)

## 2023-03-29 NOTE — Patient Instructions (Signed)
Medication Instructions:  No changes *If you need a refill on your cardiac medications before your next appointment, please call your pharmacy*   Lab Work: BMP- today If you have labs (blood work) drawn today and your tests are completely normal, you will receive your results only by: MyChart Message (if you have MyChart) OR A paper copy in the mail If you have any lab test that is abnormal or we need to change your treatment, we will call you to review the results.   Testing/Procedures: Your physician has requested that you have an echocardiogram. Echocardiography is a painless test that uses sound waves to create images of your heart. It provides your doctor with information about the size and shape of your heart and how well your heart's chambers and valves are working. This procedure takes approximately one hour. There are no restrictions for this procedure. Please do NOT wear cologne, perfume, aftershave, or lotions (deodorant is allowed). Please arrive 15 minutes prior to your appointment time.  Please note: We ask at that you not bring children with you during ultrasound (echo/ vascular) testing. Due to room size and safety concerns, children are not allowed in the ultrasound rooms during exams. Our front office staff cannot provide observation of children in our lobby area while testing is being conducted. An adult accompanying a patient to their appointment will only be allowed in the ultrasound room at the discretion of the ultrasound technician under special circumstances. We apologize for any inconvenience.    Follow-Up: At [facility], you and your health needs are our priority.  As part of our continuing mission to provide you with exceptional heart care, we have created designated Provider Care Teams.  These Care Teams include your primary Cardiologist (physician) and Advanced Practice Providers (APPs -  Physician Assistants and Nurse Practitioners) who all work  together to provide you with the care you need, when you need it.  We recommend signing up for the patient portal called "MyChart".  Sign up information is provided on this After Visit Summary.  MyChart is used to connect with patients for Virtual Visits (Telemedicine).  Patients are able to view lab/test results, encounter notes, upcoming appointments, etc.  Non-urgent messages can be sent to your provider as well.   To learn more about what you can do with MyChart, go to [email or web address].    Your next appointment:    6 months with an APP

## 2023-03-29 NOTE — Patient Instructions (Addendum)
Description   Continue taking warfarin 1/2 tablet daily except for 1 tablet every Fridays. Recheck INR in 7 weeks. Anticoagulation Clinic [phone]

## 2023-03-30 ENCOUNTER — Ambulatory Visit: Payer: Medicare Other | Admitting: Internal Medicine

## 2023-03-30 ENCOUNTER — Telehealth: Payer: Self-pay | Admitting: Cardiology

## 2023-03-30 DIAGNOSIS — M21371 Foot drop, right foot: Secondary | ICD-10-CM | POA: Diagnosis not present

## 2023-03-30 DIAGNOSIS — M21372 Foot drop, left foot: Secondary | ICD-10-CM | POA: Diagnosis not present

## 2023-03-30 DIAGNOSIS — R269 Unspecified abnormalities of gait and mobility: Secondary | ICD-10-CM | POA: Diagnosis not present

## 2023-03-30 DIAGNOSIS — J449 Chronic obstructive pulmonary disease, unspecified: Secondary | ICD-10-CM | POA: Diagnosis not present

## 2023-03-30 LAB — BASIC METABOLIC PANEL
BUN/Creatinine Ratio: 16 (ref 10–24)
BUN: 22 mg/dL (ref 8–27)
CO2: 28 mmol/L (ref 20–29)
Calcium: 8.7 mg/dL (ref 8.6–10.2)
Chloride: 99 mmol/L (ref 96–106)
Creatinine, Ser: 1.4 mg/dL — ABNORMAL HIGH (ref 0.76–1.27)
Glucose: 244 mg/dL — ABNORMAL HIGH (ref 70–99)
Potassium: 3.6 mmol/L (ref 3.5–5.2)
Sodium: 143 mmol/L (ref 134–144)
eGFR: 56 mL/min/{1.73_m2} — ABNORMAL LOW (ref >59)

## 2023-03-30 NOTE — Telephone Encounter (Deleted)
-----   Message from [name] sent at 03/30/2023  7:36 AM EST ----- Ok.  No worries.  The story sounded too familiar.  I think it is fine for him to be anticoagulated.  He has had chronic BPH type bleeding and has bladder cancer but I scoped him yesterday and he has no obvious tumor recurrence at this time.  So, I think it is ok.  He will intermittently likely have bleeding and we are doing all we can to address that (finasteride and he is now getting systemic immunotherapy rather than intravesical treatments which should decrease his bladder irritation).  If the plan is to consider Watchman and get him off anticoagulation long term though, he may really benefit from that if it's an option for him.  Feel free to call if you want to discuss further, Jake.  [phone] ----- Message ----- From: Rollene Rotunda, MD Sent: 03/30/2023   7:28 AM EST To: Heloise Purpura, MD  Les your correct.  I am all over the place.  It is [name] I am wanting to know if I can start Eliquis.   Sorry again for the confusion.  Jake ----- Message ----- From: Heloise Purpura, MD Sent: 03/30/2023   7:02 AM EST To: Rollene Rotunda, MD  Just to clarify, Leta Jungling, I did see [name] late yesterday who you also follow who mentioned a similar situation.  Just making sure we have the correct patient here. ----- Message ----- From: Rollene Rotunda, MD Sent: 03/29/2023   2:27 PM EST To: Heloise Purpura, MD  Sorry thanks.  Jake ----- Message ----- From: Heloise Purpura, MD Sent: 03/29/2023  12:32 PM EST To: Rollene Rotunda, MD; Jannifer Hick, MD  Leta Jungling,  I am going to loop in [place] who is his urologist. ----- Message ----- From: Rollene Rotunda, MD Sent: 03/29/2023   8:43 AM EST To: Heloise Purpura, MD  Can I restart Eliquis on this patient?  He has stopped it because of it recurrent bleeding but none recently.  Ultimately I am going to refer him for a Watchman if we think his prognosis from this cancer gives him some longevity.   Let me know your thoughts.  Leta Jungling

## 2023-04-03 ENCOUNTER — Other Ambulatory Visit: Payer: Self-pay | Admitting: Nurse Practitioner

## 2023-04-03 ENCOUNTER — Encounter: Payer: Self-pay | Admitting: *Deleted

## 2023-04-03 DIAGNOSIS — E11621 Type 2 diabetes mellitus with foot ulcer: Secondary | ICD-10-CM | POA: Diagnosis not present

## 2023-04-03 DIAGNOSIS — Z7985 Long-term (current) use of injectable non-insulin antidiabetic drugs: Secondary | ICD-10-CM | POA: Diagnosis not present

## 2023-04-03 DIAGNOSIS — Z794 Long term (current) use of insulin: Secondary | ICD-10-CM | POA: Diagnosis not present

## 2023-04-03 DIAGNOSIS — E119 Type 2 diabetes mellitus without complications: Secondary | ICD-10-CM

## 2023-04-03 DIAGNOSIS — F17210 Nicotine dependence, cigarettes, uncomplicated: Secondary | ICD-10-CM | POA: Diagnosis not present

## 2023-04-03 DIAGNOSIS — L97512 Non-pressure chronic ulcer of other part of right foot with fat layer exposed: Secondary | ICD-10-CM | POA: Diagnosis not present

## 2023-04-03 MED ORDER — GABAPENTIN 300 MG PO CAPS: 900.0000 mg | ORAL_CAPSULE | Freq: Three times a day (TID) | ORAL | 11 refills | Status: DC

## 2023-04-07 ENCOUNTER — Other Ambulatory Visit: Payer: Self-pay | Admitting: Cardiology

## 2023-04-10 ENCOUNTER — Other Ambulatory Visit: Payer: Self-pay | Admitting: Family Medicine

## 2023-04-10 DIAGNOSIS — M1A29X1 Drug-induced chronic gout, multiple sites, with tophus (tophi): Secondary | ICD-10-CM

## 2023-04-17 ENCOUNTER — Telehealth: Payer: Self-pay | Admitting: Internal Medicine

## 2023-04-17 MED ORDER — PREDNISONE 10 MG PO TABS: ORAL_TABLET | ORAL | 0 refills | Status: DC

## 2023-04-17 MED ORDER — AZITHROMYCIN 250 MG PO TABS: ORAL_TABLET | ORAL | 0 refills | Status: DC

## 2023-04-17 NOTE — Telephone Encounter (Signed)
Zpak/ Prednisone 10 mg take  4 each am x 2 days,   2 each am x 2 days,  1 each am x 2 days and stop

## 2023-04-17 NOTE — Telephone Encounter (Signed)
Symptoms for 1 week No fevers, chills or sweats Cough with green sputum Increased SOB- checked his O2 and it was 92% Wheezing  Trelegy- every day Albuterol Proventil

## 2023-04-17 NOTE — Telephone Encounter (Signed)
Patient is experiencing a rough cough and a runny nose. Patient think he has bronchitis. Please call and advise.  Pharmacy: [facility] in [place]

## 2023-04-17 NOTE — Telephone Encounter (Signed)
I have sent in the prescriptions and notified the patient's wife (DPR).  Nothing further needed.

## 2023-04-19 DIAGNOSIS — J449 Chronic obstructive pulmonary disease, unspecified: Secondary | ICD-10-CM | POA: Diagnosis not present

## 2023-04-19 DIAGNOSIS — M51369 Other intervertebral disc degeneration, lumbar region without mention of lumbar back pain or lower extremity pain: Secondary | ICD-10-CM | POA: Diagnosis not present

## 2023-04-19 DIAGNOSIS — Z794 Long term (current) use of insulin: Secondary | ICD-10-CM | POA: Diagnosis not present

## 2023-04-19 DIAGNOSIS — E1140 Type 2 diabetes mellitus with diabetic neuropathy, unspecified: Secondary | ICD-10-CM | POA: Diagnosis not present

## 2023-04-19 DIAGNOSIS — M5030 Other cervical disc degeneration, unspecified cervical region: Secondary | ICD-10-CM | POA: Diagnosis not present

## 2023-04-24 ENCOUNTER — Other Ambulatory Visit: Payer: Self-pay

## 2023-04-24 DIAGNOSIS — Z8631 Personal history of diabetic foot ulcer: Secondary | ICD-10-CM | POA: Diagnosis not present

## 2023-04-24 DIAGNOSIS — L97512 Non-pressure chronic ulcer of other part of right foot with fat layer exposed: Secondary | ICD-10-CM | POA: Diagnosis not present

## 2023-04-24 DIAGNOSIS — E119 Type 2 diabetes mellitus without complications: Secondary | ICD-10-CM | POA: Diagnosis not present

## 2023-04-24 DIAGNOSIS — Z09 Encounter for follow-up examination after completed treatment for conditions other than malignant neoplasm: Secondary | ICD-10-CM | POA: Diagnosis not present

## 2023-04-24 DIAGNOSIS — E11621 Type 2 diabetes mellitus with foot ulcer: Secondary | ICD-10-CM | POA: Diagnosis not present

## 2023-04-24 MED ORDER — TORSEMIDE 20 MG PO TABS: ORAL_TABLET | ORAL | 3 refills | Status: DC

## 2023-04-25 DIAGNOSIS — L03116 Cellulitis of left lower limb: Secondary | ICD-10-CM | POA: Diagnosis not present

## 2023-04-25 DIAGNOSIS — Z23 Encounter for immunization: Secondary | ICD-10-CM | POA: Diagnosis not present

## 2023-04-25 DIAGNOSIS — M7022 Olecranon bursitis, left elbow: Secondary | ICD-10-CM | POA: Diagnosis not present

## 2023-04-26 ENCOUNTER — Ambulatory Visit: Payer: Commercial Managed Care - HMO | Admitting: Dermatology

## 2023-05-04 ENCOUNTER — Encounter: Payer: Self-pay | Admitting: Internal Medicine

## 2023-05-04 ENCOUNTER — Ambulatory Visit: Payer: Medicare Other | Admitting: Internal Medicine

## 2023-05-04 VITALS — BP 124/78 | HR 77 | Ht 74.0 in | Wt 263.0 lb

## 2023-05-04 DIAGNOSIS — Z794 Long term (current) use of insulin: Secondary | ICD-10-CM

## 2023-05-04 DIAGNOSIS — E278 Other specified disorders of adrenal gland: Secondary | ICD-10-CM | POA: Diagnosis not present

## 2023-05-04 DIAGNOSIS — E1122 Type 2 diabetes mellitus with diabetic chronic kidney disease: Secondary | ICD-10-CM | POA: Diagnosis not present

## 2023-05-04 DIAGNOSIS — E785 Hyperlipidemia, unspecified: Secondary | ICD-10-CM | POA: Diagnosis not present

## 2023-05-04 DIAGNOSIS — N1831 Chronic kidney disease, stage 3a: Secondary | ICD-10-CM | POA: Diagnosis not present

## 2023-05-04 LAB — POCT GLYCOSYLATED HEMOGLOBIN (HGB A1C): Hemoglobin A1C: 8.9 % — AB (ref 4.0–5.6)

## 2023-05-04 MED ORDER — LANTUS SOLOSTAR 100 UNIT/ML ~~LOC~~ SOPN: 75.0000 [IU] | PEN_INJECTOR | Freq: Every day | SUBCUTANEOUS | Status: DC

## 2023-05-04 MED ORDER — OZEMPIC (1 MG/DOSE) 4 MG/3ML ~~LOC~~ SOPN: 1.0000 mg | PEN_INJECTOR | SUBCUTANEOUS | 3 refills | Status: DC

## 2023-05-04 NOTE — Patient Instructions (Addendum)
Please continue: - U500 insulin - 30 min before meals 100-140 units in the morning  100-140 units in the evening   Please increase: - Lantus 75 units daily at bedtime   Try to restart and increase: - Ozempic to 1 mg weekly  STOP sweet drinks!  Please stop at the lab.  STOP vitamin A and Zinc.  Please return in 1.5 months.

## 2023-05-04 NOTE — Progress Notes (Addendum)
Patient ID: [name], male   DOB: 1955-11-27, 67 y.o.   MRN: [id number]  HPI: [name] is a 67 y.o.-year-old male, returning for follow-up for DM2, dx in 2012, insulin-dependent since 2019, uncontrolled, with complications (CHF, CAD, Afib/flutter, PAD, CKD, PN). Pt. previously saw Dr. Everardo All, but last visit with me 4 months ago.  Interim history: No increased urination, nausea, chest pain.  He does have blurry vision after he burned his eyes with carburetor fluid 11/2022.  He saw ophthalmology right afterwards. He continues to have weakness, muscle and joint pain.  She sees the pain clinic.  He feels this is related to the Covid vaccines. He has insomnia. He drinks 16 oz soda a day!! - 8 oz 2x a day. He has icecream in the house. He mentions he only eats this when sugars are low.  Reviewed HbA1c: Lab Results  Component Value Date   HGBA1C 7.8 11/16/2022   HGBA1C 7.6 (A) 06/28/2022   HGBA1C 7.6 (A) 05/11/2022   HGBA1C 7.7 (A) 11/30/2021   HGBA1C 7.6 (A) 07/29/2021   HGBA1C 8.1 (A) 05/26/2021   HGBA1C 7.6 (A) 11/09/2020   HGBA1C 7.6 (A) 08/20/2020   HGBA1C 8.1 (A) 06/17/2020   HGBA1C 8.7 (A) 04/13/2020   Previously on: - Trulicity 3 mg weekly - Humalog 0-60 units 1-5x a day, before meals - max amount 270 units/day (!) Per review of Dr. George Hugh notes, time-released insulins caused a rash in the past.  Patient does not remember this at today's visit. He tried [place] but this caused dehydration. Metformin dose is limited by abdominal pain. Trulicity dose is limited by nausea. In the past, he has been missing insulin 2 to 3 days at that time per review of Dr. George Hugh notes.  Then on: - Trulicity 3 mg weekly - NovoLog >> Aspart 40-50 units 3x a day, before meals - Basaglar 70 >> 75 units at bedtime  Prev. on: - Trulicity 3 mg weekly - U500 insulin - missing doses, takes it mostly after the meals, or misses 120-140 >> actually taking 60-80  units of insulin in the  morning   70-100 >> 90-110 >> actually taking 40-60 units in the evening  At last visit he was on: - U500 insulin - 30 min before meals 100-120 units in the morning  100-120 units in the evening He was not able to start Basaglar 50 units daily at bedtime as this is not covered for him.  We changed to: - Ozempic 0.25 >> 0.5 mg weekly >> off now -ran out of the sample pen last week and - U500 insulin - 30 min before meals 100-120 >> 100-140 units in the morning  100-120 >> 100-140 units in the evening - Lantus 50 units daily at bedtime  Pt checks his sugars >4x a day with his Dexcom CGM:  Prev.:  Previously:   Lowest sugar was 70s-80s >> 50s; he has hypoglycemia awareness at 110.  Highest sugar was 600 ...>> 300s  Glucometer: ReliOn  Pt's meals are: - Breakfast: sausage biscuit - Lunch: banana sandwich, leftovers - Dinner: meat and veggies, seldom cake - Snacks: occas. - pasta, etc.  - + CKD, last BUN/creatinine:  Lab Results  Component Value Date   BUN 22 03/29/2023   BUN 28 (H) 08/01/2022   CREATININE 1.40 (H) 03/29/2023   CREATININE 1.25 08/01/2022  12/22/2022: 25/1.54, GFR 50, glucose 155  Lab Results  Component Value Date   MICRALBCREAT >300 08/01/2022  On diltiazem, Cozaar 50  mg daily.  -+ HL; last set of lipids: 12/22/2022: 95/286/24/37 Lab Results  Component Value Date   CHOL 154 08/01/2022   HDL 23 (L) 08/01/2022   LDLCALC 63 08/01/2022   LDLDIRECT 99.9 09/08/2011   TRIG 437 (H) 08/01/2022   CHOLHDL 6.7 (H) 08/01/2022  Off pravastatin 80 mg daily - stopped 2/2 mm weakness.  He was started on Crestor 10 mg daily 10/2022.  - last eye exam was in 2024. Reportedly No DR. Incipient cataracts.  - + numbness and tingling in his feet. Also, significant pain.  Last foot exam 11/25/2022.  On Neurontin 300 mg 3 capsules twice daily. He sees pain management. He sees podiatry.  Latest TSH 5.159 on 12/22/2022. He also has a history of HTN, OSA, COPD, GERD, DDD,  gout. He is still smoking.  ROS: + see HPI  Past Medical History:  Diagnosis Date   Actinic keratosis    Arthritis    lower back   Asthma    Atrial fibrillation [facility])    Atrial flutter (HCC)    s/p CTI ablation by Dr Johney Frame   Brain aneurysm 2009   questionable. A follow up CTA in 2009 showed no evidence of   Chronic back pain    DDD/stenosis   Colon polyps    9 polyps removed 10/13/11   Complication of anesthesia 09/11/2012   slow to awaken after ablation   COPD (chronic obstructive pulmonary disease) (HCC)    Diabetes mellitus    takes Metformin and Glimepiride daily   Emphysema    GERD (gastroesophageal reflux disease)    takes Omeprazole bid   Heart failure (HCC)    History of shingles    HLD (hyperlipidemia)    takes Pravastatin daily   HTN (hypertension)    takes Prinizide daily   Obesity    OSA (obstructive sleep apnea)    not always using cpap   Overdose 2009   unintentional Flecanide overdose   Peripheral neuropathy    Short-term memory loss    Tobacco abuse    Past Surgical History:  Procedure Laterality Date   ANTERIOR CERVICAL DECOMP/DISCECTOMY FUSION  01/04/2012   Procedure: ANTERIOR CERVICAL DECOMPRESSION/DISCECTOMY FUSION 1 LEVEL/HARDWARE REMOVAL;  Surgeon: Cristi Loron, MD;  Location: [facility] NEURO ORS;  Service: Neurosurgery;  Laterality: N/A;  explore cervical fusion Cervical six - seven  with removal of codman plate anterior cervical decompression with fusion interbody prothesis plating and bonegraft   APPENDECTOMY  2-38yrs ago   ATRIAL FIBRILLATION ABLATION N/A 09/11/2012   PT DID NOT HAVE AN ATRIAL FIBRILLATION ABLATION IN 2014!  ATRIAL FLUTTER ABLATION ONLY   ATRIAL FLUTTER ABLATION  09/11/2012   CTI ablation by Dr Johney Frame   BIOPSY  11/07/2019   Procedure: BIOPSY;  Surgeon: Rachael Fee, MD;  Location: [facility] ENDOSCOPY;  Service: Endoscopy;;   CARDIAC CATHETERIZATION  2008   no significant CAD   CARDIOVERSION  05/05/2011   Procedure:  CARDIOVERSION;  Surgeon: Lewayne Bunting, MD;  Location: [facility] ENDOSCOPY;  Service: Cardiovascular;  Laterality: N/A;   CARDIOVERSION Bilateral 07/26/2012   Procedure: CARDIOVERSION;  Surgeon: Rollene Rotunda, MD;  Location: [facility] ENDOSCOPY;  Service: Cardiovascular;  Laterality: Bilateral;   CARPAL TUNNEL RELEASE  99/2000   bilateral   COLONOSCOPY WITH PROPOFOL N/A 12/13/2012   Procedure: COLONOSCOPY WITH PROPOFOL;  Surgeon: Rachael Fee, MD;  Location: [facility] ENDOSCOPY;  Service: Endoscopy;  Laterality: N/A;   ELECTROPHYSIOLOGIC STUDY N/A 04/21/2015   Procedure: Atrial Fibrillation Ablation;  Surgeon: Fayrene Fearing  Allred, MD;  Location: [facility] INVASIVE CV LAB;  Service: Cardiovascular;  Laterality: N/A;   ESOPHAGOGASTRODUODENOSCOPY (EGD) WITH PROPOFOL N/A 11/07/2019   Procedure: ESOPHAGOGASTRODUODENOSCOPY (EGD) WITH PROPOFOL;  Surgeon: Rachael Fee, MD;  Location: [facility] ENDOSCOPY;  Service: Endoscopy;  Laterality: N/A;   HERNIA REPAIR     KNEE ARTHROSCOPY WITH MEDIAL MENISECTOMY Left 04/23/2021   Procedure: LEFT KNEE ARTHROSCOPY WITH PARTIAL MEDIAL MENISCECTOMY;  Surgeon: Eldred Manges, MD;  Location: [facility] OR;  Service: Orthopedics;  Laterality: Left;   KNEE SURGERY  6-79yrs ago   left   LEFT HEART CATH AND CORONARY ANGIOGRAPHY N/A 09/19/2016   Procedure: Left Heart Cath and Coronary Angiography;  Surgeon: Lyn Records, MD;  Location: [facility] INVASIVE CV LAB;  Service: Cardiovascular;  Laterality: N/A;   Left inguinal hernia repair     as a child   NASAL SEPTOPLASTY W/ TURBINOPLASTY Bilateral 02/19/2014   Procedure: NASAL SEPTOPLASTY WITH BILATERAL TURBINATE REDUCTION;  Surgeon: Flo Shanks, MD;  Location: [facility] OR;  Service: ENT;  Laterality: Bilateral;   NECK SURGERY  51yrs ago   right shoulder surgery  4-67yrs ago   cyst removed   TEE WITHOUT CARDIOVERSION  05/05/2011   Procedure: TRANSESOPHAGEAL ECHOCARDIOGRAM (TEE);  Surgeon: Lewayne Bunting, MD;  Location: [facility] ENDOSCOPY;  Service: Cardiovascular;  Laterality: N/A;    TEE WITHOUT CARDIOVERSION N/A 04/21/2015   Procedure: TRANSESOPHAGEAL ECHOCARDIOGRAM (TEE);  Surgeon: Quintella Reichert, MD;  Location: [facility] ENDOSCOPY;  Service: Cardiovascular;  Laterality: N/A;   THROAT SURGERY  4-63yrs ago   "thought " it was cancer but came back not   TONSILLECTOMY     Social History   Socioeconomic History   Marital status: Married    Spouse name: Not on file   Number of children: 2   Years of education: Not on file   Highest education level: Not on file  Occupational History   Occupation: Curator   Tobacco Use   Smoking status: Every Day    Current packs/day: 1.00    Average packs/day: 1 pack/day for 49.0 years (49.0 ttl pk-yrs)    Types: Cigarettes, E-cigarettes   Smokeless tobacco: Never   Tobacco comments:    Smoking almost a pack per day Patient is vaping daily.  09/28/2022 am a pack of cigs in 4days 01/05/23  Vaping Use   Vaping status: Every Day  Substance and Sexual Activity   Alcohol use: Not Currently    Alcohol/week: 0.0 standard drinks of alcohol    Comment: 12/05/17 pt stated he has drinked 1/2 drink in 3 years   Drug use: No   Sexual activity: Yes  Other Topics Concern   Not on file  Social History Narrative   Daily caffeine(Mountain Dew) Lives in [place] with spouse.Unemployed due to chronic back/ leg pain         Are you right handed or left handed? Left Handed    Are you currently employed ? No    What is your current occupation? No   Do you live at home alone? no   Who lives with you? Lives with wife and 3 doggies   What type of home do you live in: 1 story or 2 story? Lives in one story home       Social Drivers of Health   Financial Resource Strain: Not on file  Food Insecurity: Not on file  Transportation Needs: Not on file  Physical Activity: Not on file  Stress: Not on file  Social Connections:  Not on file  Intimate Partner Violence: Not on file   Current Outpatient Medications on File Prior to Visit  Medication  Sig Dispense Refill   albuterol (PROVENTIL) (2.5 MG/3ML) 0.083% nebulizer solution Take 3 mLs (2.5 mg total) by nebulization every 6 (six) hours as needed for wheezing or shortness of breath. 75 mL 12   albuterol (VENTOLIN HFA) 108 (90 Base) MCG/ACT inhaler INHALE 1-2 PUFFS INTO THE LUNGS EVERY 6 HOURS AS NEEDED FOR WHEEZING OR SHORTNESS OF BREATH 8.5 g 5   azithromycin (ZITHROMAX) 250 MG tablet Take 2 tablets (500 mg) on  Day 1,  followed by 1 tablet (250 mg) once daily on Days 2 through 5. 6 each 0   b complex vitamins tablet Take 1 tablet by mouth daily.      bisoprolol (ZEBETA) 10 MG tablet Take 1 tablet (10 mg total) by mouth daily. 90 tablet 1   clobetasol cream (TEMOVATE) 0.05 % Apply 1 Application topically 2 (two) times daily. (Patient not taking: Reported on 03/29/2023) 60 g 2   clotrimazole-betamethasone (LOTRISONE) cream Apply 1 Application topically daily. (Patient not taking: Reported on 03/29/2023) 45 g 5   colchicine 0.6 MG tablet TAKE 2 TABLETS BY MOUTH AT ONCE. THEN TAKE 1 TABLET 1 HOUR LATER. WAIT 2 DAYS, THEN BEGIN TAKING 1 TABLET DAILY TO PREVENT ACUTE GOUT. 33 tablet 2   Continuous Blood Gluc Transmit (DEXCOM G6 TRANSMITTER) MISC USE AS DIRECTED (Patient not taking: Reported on 03/29/2023) 1 each 3   Continuous Glucose Receiver (DEXCOM G7 RECEIVER) DEVI See admin instructions.     Continuous Glucose Sensor (DEXCOM G7 SENSOR) MISC 3 each by Does not apply route every 30 (thirty) days. Apply 1 sensor every 10 days 9 each 4   diltiazem (CARDIZEM CD) 120 MG 24 hr capsule TAKE 1 CAPSULE BY MOUTH DAILY (Patient not taking: Reported on 03/29/2023) 90 capsule 0   famotidine (PEPCID) 20 MG tablet One after supper (Patient not taking: Reported on 03/29/2023) 30 tablet 11   febuxostat (ULORIC) 40 MG tablet TAKE 1 TABLET BY MOUTH DAILY 30 tablet 3   Fluticasone-Umeclidin-Vilant (TRELEGY ELLIPTA) 100-62.5-25 MCG/ACT AEPB Inhale 1 puff into the lungs every morning. 6 each 0    Fluticasone-Umeclidin-Vilant (TRELEGY ELLIPTA) 100-62.5-25 MCG/ACT AEPB One click each am (Patient not taking: Reported on 03/29/2023) 28 each 5   gabapentin (NEURONTIN) 300 MG capsule Take 3 capsules (900 mg total) by mouth 3 (three) times daily. TAKE 3 CAPSULES BY MOUTH THREE TIMES DAILY FOR NEUROPATHY 270 capsule 11   Glucagon 3 MG/DOSE POWD Place 3 mg into the nose once as needed for up to 1 dose. 1 each 11   Insulin Glargine (BASAGLAR KWIKPEN) 100 UNIT/ML Inject 50 Units into the skin daily. (Patient not taking: Reported on 03/20/2023) 30 mL 1   insulin glargine (LANTUS SOLOSTAR) 100 UNIT/ML Solostar Pen Inject 50 Units into the skin daily. 30 mL 3   Insulin Pen Needle (EASY COMFORT PEN NEEDLES) 31G X 5 MM MISC USE 4 TIMES DAILY AS DIRECTED 300 each 3   insulin regular human CONCENTRATED (HUMULIN R U-500 KWIKPEN) 500 UNIT/ML KwikPen Inject 260 Units into the skin daily. Use up to 250-260 units of insulin daily as advised 48 mL 1   ketoconazole (NIZORAL) 2 % cream Apply 1 Application topically daily. (Patient not taking: Reported on 03/29/2023) 60 g 2   losartan (COZAAR) 50 MG tablet TAKE 1 TABLET BY MOUTH DAILY 90 tablet 4   metolazone (ZAROXOLYN) 2.5 MG tablet TAKE  ONE TABLET BY MOUTH ON TUESDAYS AND THURSDAYS 30 MINUTES BEFORE TAKING TORSEMIDE AS DIRECTED 8 tablet 6   mupirocin ointment (BACTROBAN) 2 % Ply small amount to effected area twice daily for 10 days. (Patient not taking: Reported on 03/29/2023) 22 g 1   nitroGLYCERIN (NITROSTAT) 0.4 MG SL tablet Place 1 tablet (0.4 mg total) under the tongue every 5 (five) minutes x 3 doses as needed for chest pain. 25 tablet 3   omeprazole (PRILOSEC) 40 MG capsule Take 1 capsule by mouth daily. (Patient not taking: Reported on 03/29/2023)     oxyCODONE-acetaminophen (PERCOCET) 10-325 MG tablet Take 1.5 tablets by mouth in the morning, at noon, in the evening, and at bedtime. (Patient not taking: Reported on 03/29/2023)     pantoprazole (PROTONIX) 40 MG  tablet Take 1 tablet (40 mg total) by mouth daily. Take 30-60 min before first meal of the day (Patient not taking: Reported on 03/29/2023) 30 tablet 11   potassium chloride SA (KLOR-CON M) 20 MEQ tablet TAKE 1 TABLET BY MOUTH DAILY. TAKE AN EXTRA TABLET ON TUESDAY AND THURSDAY 30 tablet 6   predniSONE (DELTASONE) 10 MG tablet Take 4 tablets each am x 2 days, 2 tablets each am x 2 days, 1 tablet each am x 2 days and stop 14 tablet 0   primidone (MYSOLINE) 50 MG tablet Take 0.5 tablets (25 mg total) by mouth daily. (Patient not taking: Reported on 03/29/2023) 45 tablet 3   rosuvastatin (CRESTOR) 10 MG tablet Take 1 tablet (10 mg total) by mouth daily. 90 tablet 1   sulfamethoxazole-trimethoprim (BACTRIM DS) 800-160 MG tablet Take 1 tablet by mouth 2 (two) times daily. 28 tablet 0   tacrolimus (PROTOPIC) 0.1 % ointment Apply topically 2 (two) times daily. (Patient not taking: Reported on 03/29/2023) 100 g 2   tamsulosin (FLOMAX) 0.4 MG CAPS capsule TAKE 1 CAPSULE BY MOUTH EACH NIGHT AT BEDTIME (Patient not taking: Reported on 03/29/2023) 30 capsule 11   torsemide (DEMADEX) 20 MG tablet TAKE 2 TABELTS BY MOUTH DAILY IN THE MORNING AND 1 IN THE EVENING 270 tablet 3   triamcinolone cream (KENALOG) 0.1 % Apply 1 Application topically daily as needed. (Patient not taking: Reported on 03/29/2023) 453 g 2   TRULICITY 3 MG/0.5ML SOPN INJECT 3 MGS SUBCUTANEOUSLY ONCE WEEKLY (Patient not taking: Reported on 03/20/2023) 6 mL 3   VITAMIN A PO Take 2,400 mcg by mouth daily.      Vitamin D, Ergocalciferol, (DRISDOL) 1.25 MG (50000 UNIT) CAPS capsule Take 1 capsule (50,000 Units total) by mouth every 7 (seven) days. 12 capsule 3   warfarin (COUMADIN) 5 MG tablet Take 1/2 to 1 tablet daily or as directed 70 tablet 1   No current facility-administered medications on file prior to visit.   Allergies  Allergen Reactions   Adhesive [Tape]     itching   Latex Itching    When tape is on the skin too long skin gets red  & itching   Family History  Problem Relation Age of Onset   Diabetes Mother    Heart disease Father    Lung cancer Father    Diabetes Maternal Grandmother    Colon cancer Neg Hx    Anesthesia problems Neg Hx    Hypotension Neg Hx    Malignant hyperthermia Neg Hx    Pseudochol deficiency Neg Hx    Esophageal cancer Neg Hx    Pancreatic cancer Neg Hx    Stomach cancer Neg Hx  PE: BP 124/78   Pulse 77   Ht 6\' 2"  (1.88 m)   Wt 263 lb (119.3 kg)   SpO2 93%   BMI 33.77 kg/m  Wt Readings from Last 3 Encounters:  05/04/23 263 lb (119.3 kg)  03/29/23 264 lb 3.2 oz (119.8 kg)  03/20/23 261 lb 9.6 oz (118.7 kg)   Constitutional: overweight, in NAD, walks with a cane Eyes: no exophthalmos ENT: no thyromegaly, no cervical lymphadenopathy Cardiovascular: RRR, No MRG Respiratory:  +wheezing B lungs Musculoskeletal: no deformities Skin: + rash B shins - ?NLD; also cigarette burns on bilateral legs as he dropped his cigarette while sleeping Neurological: +  tremor with outstretched hands  ASSESSMENT: 1. DM2, insulin-dependent, uncontrolled, with complications - CHF - A fib/flutter - PAD - CKD stage III - PN - CAD  2. HL - fatty liver  3.  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 Patient with longstanding, uncontrolled, insulin resistant type 2 diabetes, on injectable antidiabetic regimen with U-500 insulin, to which we added basal insulin and weekly GLP-1 receptor agonist at last visit (he was previously on Trulicity and he could not tolerate a higher dose than 3 mg due to GI side effects).  At that time, sugars were very high and HbA1c was also elevated, at 8.7%.  However, reviewing his CGM tracings, sugars appears to be almost exclusively fluctuating above the upper limit of the target range.  He did not feel his insulin was expired and did not keep the unopened pens out of the fridge.  As injections in abdomen causes burning I advised him to use the thighs.   I also reviewed correct injection techniques with him.  We discussed about trying to change the pen of U-500 insulin and also I suggested to add Lantus 50 units at bedtime and gave him a sample of Ozempic.  We also gave him paperwork for the patient assistance program with [employer] so that he can get patient assistance for this.  I again advised him to inject the U-500 insulin 30 minutes before meals. CGM interpretation: -At today's visit, we reviewed his CGM downloads: It appears that 23% of values are in target range (goal >70%), while 77% are higher than 180 (goal <25%), and 0% are lower than 70 (goal <4%).  The calculated average blood sugar is 222.  The projected HbA1c for the next 3 months (GMI) is 8.6%. -Reviewing the CGM trends, sugars appear to be slightly improved from before, still fluctuating mostly above the target range with a peak after lunch and a decrease afterwards.  Overnight, sugars are mostly fluctuating between 180 and 250s.  They start increasing after breakfast. -Upon questioning, he is drinking 8 ounces of regular soda twice a day, with breakfast and dinner.  Therefore, he is introducing approximately 25 g of sugar only from the soda with these meals.  We discussed that it would be paramount for him to stop this, but despite the fact that I explained that we cannot really control his diabetes until he stops the sweet drinks, especially in the setting of his severe insulin resistance, he is not willing to stop it. -Since the sugars are high throughout the day and night, I advised him to increase the dose of Lantus to 75 units at bedtime.  I also recommended to try to restart Ozempic after the first of the year, as he mentions that this medication would be covered afterwards.  I sent a prescription for the higher  dose of Ozempic, 1 mg weekly for him as he could tolerate well the lower doses. -At today's visit, reviewing his medication list, he is taking vitamin A and zinc ever since  the coronavirus pandemic.  I advised him to stop these right away. - I suggested to:  Patient Instructions  Please continue: - U500 insulin - 30 min before meals 100-140 units in the morning  100-140 units in the evening   Please increase: - Lantus 75 units daily at bedtime   Try to restart and increase: - Ozempic to 1 mg weekly  STOP sweet drinks!  Please stop at the lab.  STOP vitamin A and Zinc.  Please return in 1.5 months.  - we checked his HbA1c: 8.9% (higher) - advised to check sugars at different times of the day - 4x a day, rotating check times - advised for yearly eye exams >> he is UTD - he has severe peripheral neuropathy for which she sees podiatry and pain management.   - return to clinic in 1.5 months  2. HL -Latest lipid panel was abnormal: Triglycerides elevated, HDL low: 12/22/2022: 95/286/24/37 -He was taken off pravastatin 80 mg daily due to muscle weakness.  However, he felt that the muscle weakness was more related to the spine -He is on  Crestor 10 mg daily  3.  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  On the CT scan from 09/22/2022, a comment was made about nodular thickening of the right adrenal gland and this can sometimes be associated with cortisol excess and, rarely, with aldosterone overproduction.  I am wondering if he has excess cortisol which can explain his insulin resistance, significant weakness, and insomnia.  He is also on multiple medications for blood pressure control. -At last visit, I advised him to stop at the lab and have an ACTH and cortisol checked.  We also discussed about possibly performing a dexamethasone suppression test.  My plan was to check an adrenal CT scan after the results returned.  However, at last visit, he did not stop at the lab.  We called him to return for fasting samples but he did not do so yet. -I  will have him stop at the lab for the above labs, but may need to repeat them fasting, in a.m.  Component     Latest Ref Rng 05/04/2023  Hemoglobin A1C     4.0 - 5.6 % 8.9 !   C206 ACTH     6 - 50 pg/mL 22   Cortisol, Plasma     mcg/dL 16.1   DHEA-SO4     20 - 217 mcg/dL 99   Adrenal labs are normal.  Will ask him to come back for dexamethasone suppression test.  Carlus Pavlov, MD PhD [facility]

## 2023-05-05 ENCOUNTER — Ambulatory Visit (HOSPITAL_COMMUNITY): Payer: Medicare Other

## 2023-05-08 ENCOUNTER — Other Ambulatory Visit: Payer: Self-pay | Admitting: Nurse Practitioner

## 2023-05-08 DIAGNOSIS — I2510 Atherosclerotic heart disease of native coronary artery without angina pectoris: Secondary | ICD-10-CM

## 2023-05-10 LAB — DHEA-SULFATE: DHEA-SO4: 99 ug/dL (ref 20–217)

## 2023-05-10 LAB — ACTH: C206 ACTH: 22 pg/mL (ref 6–50)

## 2023-05-10 LAB — CORTISOL: Cortisol, Plasma: 16.1 ug/dL

## 2023-05-11 MED ORDER — DEXAMETHASONE 1 MG PO TABS: ORAL_TABLET | ORAL | 0 refills | Status: DC

## 2023-05-11 NOTE — Addendum Note (Signed)
Addended by: Carlus Pavlov on: 05/11/2023 12:55 PM   Modules accepted: Orders

## 2023-05-18 ENCOUNTER — Ambulatory Visit: Payer: Medicare Other | Attending: Internal Medicine | Admitting: *Deleted

## 2023-05-18 ENCOUNTER — Ambulatory Visit: Payer: Medicare Other

## 2023-05-18 DIAGNOSIS — I480 Paroxysmal atrial fibrillation: Secondary | ICD-10-CM

## 2023-05-18 DIAGNOSIS — Z7901 Long term (current) use of anticoagulants: Secondary | ICD-10-CM

## 2023-05-18 LAB — POCT INR: INR: 2.6 (ref 2.0–3.0)

## 2023-05-18 NOTE — Patient Instructions (Signed)
 Continue taking warfarin 1/2 tablet daily except for 1 tablet every Fridays. Recheck INR in 7 weeks. Anticoagulation Clinic [phone]

## 2023-05-19 ENCOUNTER — Telehealth: Payer: Self-pay

## 2023-05-19 ENCOUNTER — Ambulatory Visit: Payer: Medicare Other | Admitting: Internal Medicine

## 2023-05-19 ENCOUNTER — Encounter: Payer: Self-pay | Admitting: Internal Medicine

## 2023-05-19 ENCOUNTER — Ambulatory Visit: Payer: Medicare Other | Admitting: Orthopaedic Surgery

## 2023-05-19 VITALS — BP 100/50 | HR 68 | Ht 73.0 in

## 2023-05-19 DIAGNOSIS — R1310 Dysphagia, unspecified: Secondary | ICD-10-CM | POA: Diagnosis not present

## 2023-05-19 DIAGNOSIS — K7682 Hepatic encephalopathy: Secondary | ICD-10-CM

## 2023-05-19 DIAGNOSIS — K6289 Other specified diseases of anus and rectum: Secondary | ICD-10-CM

## 2023-05-19 DIAGNOSIS — K7030 Alcoholic cirrhosis of liver without ascites: Secondary | ICD-10-CM | POA: Diagnosis not present

## 2023-05-19 DIAGNOSIS — K640 First degree hemorrhoids: Secondary | ICD-10-CM | POA: Diagnosis not present

## 2023-05-19 DIAGNOSIS — K625 Hemorrhage of anus and rectum: Secondary | ICD-10-CM

## 2023-05-19 DIAGNOSIS — R140 Abdominal distension (gaseous): Secondary | ICD-10-CM

## 2023-05-19 DIAGNOSIS — Z860100 Personal history of colon polyps, unspecified: Secondary | ICD-10-CM

## 2023-05-19 DIAGNOSIS — K5900 Constipation, unspecified: Secondary | ICD-10-CM

## 2023-05-19 DIAGNOSIS — R410 Disorientation, unspecified: Secondary | ICD-10-CM

## 2023-05-19 MED ORDER — NA SULFATE-K SULFATE-MG SULF 17.5-3.13-1.6 GM/177ML PO SOLN: ORAL | 0 refills | Status: DC

## 2023-05-19 MED ORDER — HYDROCORTISONE (PERIANAL) 2.5 % EX CREA: 1.0000 | TOPICAL_CREAM | Freq: Two times a day (BID) | CUTANEOUS | 0 refills | Status: AC

## 2023-05-19 MED ORDER — LACTULOSE 10 G PO PACK: 10.0000 g | PACK | Freq: Three times a day (TID) | ORAL | 0 refills | Status: DC

## 2023-05-19 NOTE — Telephone Encounter (Signed)
 [facility] Pre-operative Risk Assessment     Request for surgical clearance:     Endoscopy Procedure  What type of surgery is being performed?     Endoscopy and Colonoscopy   When is this surgery scheduled?     06/29/23  What type of clearance is required ?   Pharmacy  Are there any medications that need to be held prior to surgery and how long? Wafarin 5 days hold  Practice name and name of physician performing surgery?      [facility]  What is your office phone and fax number?      Phone- [phone]  Fax- [phone]  Anesthesia type (None, local, MAC, general) ?       MAC   Please route your response to [facility]

## 2023-05-19 NOTE — Progress Notes (Addendum)
 Chief Complaint: Rectal pain  HPI : 68 year old male with history of EtOH cirrhosis, Aflutter on warfarin, DM, COPD, HFpEF, obesity, and OSA presents with rectal pain  Interval History: Patient was last seen in GI clinic in 02/2022 for presumed alcoholic cirrhosis.  He started developing really bad rectal pain about a week ago. This pain woke up out of sleep. Endorses some scant rectal bleeding, which is chronic. He does have intermittent constipation at least once per week. A stool softener helps with his constipation. He has one stool every 1-3 days on average. He is having pain near his left groin, which he suspects is due to his inguinal hernia. Denies N&V. Endorses dysphagia for a couple years, which has been stable in severity. He had a MBS with speech therapy in 07/2022, which only showed very mild oropharyngeal dysphagia.  He does have acid reflux on occasion. Omeprazole  seems to help with his acid reflux, and he will take Tums on occasion. When he belches, food wants to come up. Endorses worsening ab distension over time. Swelling in his legs comes and goes. Sometimes he will go out on occasion and forget where he is going. Thus he thinks he is confused sometimes. He does pass out on occasion when he is sitting in the chair. He has not had alcohol for the last 11 years. He uses a cane to get around and his electric chair broke down last night. He is on warfarin therapy.  Patient states that he is supposed to be on inhaler therapy but that he has been giving his inhalers to his wife.   Past Medical History:  Diagnosis Date   Actinic keratosis    Arthritis    lower back   Asthma    Atrial fibrillation [facility])    Atrial flutter [facility])    s/p CTI ablation by Dr Kelsie   Brain aneurysm 2009   questionable. A follow up CTA in 2009 showed no evidence of   Chronic back pain    DDD/stenosis   Colon polyps    9 polyps removed 10/13/11   Complication of anesthesia 09/11/2012   slow to awaken  after ablation   COPD (chronic obstructive pulmonary disease) (HCC)    Diabetes mellitus    takes Metformin  and Glimepiride  daily   Emphysema    GERD (gastroesophageal reflux disease)    takes Omeprazole  bid   Heart failure (HCC)    History of shingles    HLD (hyperlipidemia)    takes Pravastatin  daily   HTN (hypertension)    takes Prinizide daily   Obesity    OSA (obstructive sleep apnea)    not always using cpap   Overdose 2009   unintentional Flecanide overdose   Peripheral neuropathy    Short-term memory loss    Tobacco abuse     Past Surgical History:  Procedure Laterality Date   ANTERIOR CERVICAL DECOMP/DISCECTOMY FUSION  01/04/2012   Procedure: ANTERIOR CERVICAL DECOMPRESSION/DISCECTOMY FUSION 1 LEVEL/HARDWARE REMOVAL;  Surgeon: Reyes JONETTA Budge, MD;  Location: [facility]URO [facility]S;  Service: Neurosurgery;  Laterality: N/A;  explore cervical fusion Cervical six - seven  with removal of codman plate anterior cervical decompression with fusion interbody prothesis plating and bonegraft   APPENDECTOMY  2-46yrs ago   ATRIAL FIBRILLATION ABLATION N/A 09/11/2012   PT DID NOT HAVE AN ATRIAL FIBRILLATION ABLATION IN 2014!  ATRIAL FLUTTER ABLATION ONLY   ATRIAL FLUTTER ABLATION  09/11/2012   CTI ablation by Dr Kelsie   BIOPSY  11/07/2019   Procedure: BIOPSY;  Surgeon: Teressa Toribio SQUIBB, MD;  Location: [facility];  Service: Endoscopy;;   CARDIAC CATHETERIZATION  2008   no significant CAD   CARDIOVERSION  05/05/2011   Procedure: CARDIOVERSION;  Surgeon: Redell GORMAN Shallow, MD;  Location: [facility] ENDOSCOPY;  Service: Cardiovascular;  Laterality: N/A;   CARDIOVERSION Bilateral 07/26/2012   Procedure: CARDIOVERSION;  Surgeon: Lynwood Schilling, MD;  Location: [facility] ENDOSCOPY;  Service: Cardiovascular;  Laterality: Bilateral;   CARPAL TUNNEL RELEASE  99/2000   bilateral   COLONOSCOPY WITH PROPOFOL  N/A 12/13/2012   Procedure: COLONOSCOPY WITH PROPOFOL ;  Surgeon: Toribio SQUIBB Teressa, MD;  Location: [facility] ENDOSCOPY;   Service: Endoscopy;  Laterality: N/A;   ELECTROPHYSIOLOGIC STUDY N/A 04/21/2015   Procedure: Atrial Fibrillation Ablation;  Surgeon: Lynwood Rakers, MD;  Location: [facility] INVASIVE CV LAB;  Service: Cardiovascular;  Laterality: N/A;   ESOPHAGOGASTRODUODENOSCOPY (EGD) WITH PROPOFOL  N/A 11/07/2019   Procedure: ESOPHAGOGASTRODUODENOSCOPY (EGD) WITH PROPOFOL ;  Surgeon: Teressa Toribio SQUIBB, MD;  Location: [facility] ENDOSCOPY;  Service: Endoscopy;  Laterality: N/A;   HERNIA REPAIR     KNEE ARTHROSCOPY WITH MEDIAL MENISECTOMY Left 04/23/2021   Procedure: LEFT KNEE ARTHROSCOPY WITH PARTIAL MEDIAL MENISCECTOMY;  Surgeon: Barbarann Oneil BROCKS, MD;  Location: [facility] OR;  Service: Orthopedics;  Laterality: Left;   KNEE SURGERY  6-36yrs ago   left   LEFT HEART CATH AND CORONARY ANGIOGRAPHY N/A 09/19/2016   Procedure: Left Heart Cath and Coronary Angiography;  Surgeon: Claudene Victory ORN, MD;  Location: [facility] INVASIVE CV LAB;  Service: Cardiovascular;  Laterality: N/A;   Left inguinal hernia repair     as a child   NASAL SEPTOPLASTY W/ TURBINOPLASTY Bilateral 02/19/2014   Procedure: NASAL SEPTOPLASTY WITH BILATERAL TURBINATE REDUCTION;  Surgeon: Marlyce Finer, MD;  Location: [facility] OR;  Service: ENT;  Laterality: Bilateral;   NECK SURGERY  49yrs ago   right shoulder surgery  4-38yrs ago   cyst removed   TEE WITHOUT CARDIOVERSION  05/05/2011   Procedure: TRANSESOPHAGEAL ECHOCARDIOGRAM (TEE);  Surgeon: Redell GORMAN Shallow, MD;  Location: [facility] ENDOSCOPY;  Service: Cardiovascular;  Laterality: N/A;   TEE WITHOUT CARDIOVERSION N/A 04/21/2015   Procedure: TRANSESOPHAGEAL ECHOCARDIOGRAM (TEE);  Surgeon: Wilbert JONELLE Bihari, MD;  Location: [facility] ENDOSCOPY;  Service: Cardiovascular;  Laterality: N/A;   THROAT SURGERY  4-68yrs ago   thought  it was cancer but came back not   TONSILLECTOMY     Family History  Problem Relation Age of Onset   Diabetes Mother    Heart disease Father    Lung cancer Father    Diabetes Maternal Grandmother    Colon cancer Neg Hx     Anesthesia problems Neg Hx    Hypotension Neg Hx    Malignant hyperthermia Neg Hx    Pseudochol deficiency Neg Hx    Esophageal cancer Neg Hx    Pancreatic cancer Neg Hx    Stomach cancer Neg Hx    Social History   Tobacco Use   Smoking status: Every Day    Current packs/day: 1.00    Average packs/day: 1 pack/day for 49.0 years (49.0 ttl pk-yrs)    Types: Cigarettes, E-cigarettes   Smokeless tobacco: Never   Tobacco comments:    Smoking almost a pack per day Patient is vaping daily.  09/28/2022 am a pack of cigs in 4days 01/05/23  Vaping Use   Vaping status: Every Day  Substance Use Topics   Alcohol use: Not Currently    Alcohol/week: 0.0 standard drinks of alcohol  Comment: 12/05/17 pt stated he has drinked 1/2 drink in 3 years   Drug use: No   Current Outpatient Medications  Medication Sig Dispense Refill   albuterol  (PROVENTIL ) (2.5 MG/3ML) 0.083% nebulizer solution Take 3 mLs (2.5 mg total) by nebulization every 6 (six) hours as needed for wheezing or shortness of breath. 75 mL 12   albuterol  (VENTOLIN  HFA) 108 (90 Base) MCG/ACT inhaler INHALE 1-2 PUFFS INTO THE LUNGS EVERY 6 HOURS AS NEEDED FOR WHEEZING OR SHORTNESS OF BREATH 8.5 g 5   b complex vitamins tablet Take 1 tablet by mouth daily.      bisoprolol  (ZEBETA ) 10 MG tablet Take 1 tablet (10 mg total) by mouth daily. 90 tablet 1   clobetasol  cream (TEMOVATE ) 0.05 % Apply 1 Application topically 2 (two) times daily. 60 g 2   clotrimazole -betamethasone  (LOTRISONE ) cream Apply 1 Application topically daily. 45 g 5   colchicine  0.6 MG tablet TAKE 2 TABLETS BY MOUTH AT ONCE. THEN TAKE 1 TABLET 1 HOUR LATER. WAIT 2 DAYS, THEN BEGIN TAKING 1 TABLET DAILY TO PREVENT ACUTE GOUT. 33 tablet 2   Continuous Glucose Receiver (DEXCOM G7 RECEIVER) DEVI See admin instructions.     Continuous Glucose Sensor (DEXCOM G7 SENSOR) MISC 3 each by Does not apply route every 30 (thirty) days. Apply 1 sensor every 10 days 9 each 4   dexamethasone   (DECADRON ) 1 MG tablet Take 1 tablet by mouth once at 11 pm, before coming for labs at 8 am the next morning 1 tablet 0   diltiazem  (CARDIZEM  CD) 120 MG 24 hr capsule TAKE 1 CAPSULE BY MOUTH DAILY 90 capsule 0   famotidine  (PEPCID ) 20 MG tablet One after supper 30 tablet 11   febuxostat  (ULORIC ) 40 MG tablet TAKE 1 TABLET BY MOUTH DAILY 30 tablet 3   Fluticasone -Umeclidin-Vilant (TRELEGY ELLIPTA ) 100-62.5-25 MCG/ACT AEPB Inhale 1 puff into the lungs every morning. 6 each 0   Fluticasone -Umeclidin-Vilant (TRELEGY ELLIPTA ) 100-62.5-25 MCG/ACT AEPB One click each am 28 each 5   gabapentin  (NEURONTIN ) 300 MG capsule Take 3 capsules (900 mg total) by mouth 3 (three) times daily. TAKE 3 CAPSULES BY MOUTH THREE TIMES DAILY FOR NEUROPATHY 270 capsule 11   Glucagon  3 MG/DOSE POWD Place 3 mg into the nose once as needed for up to 1 dose. 1 each 11   insulin  glargine (LANTUS  SOLOSTAR) 100 UNIT/ML Solostar Pen Inject 75 Units into the skin daily.     Insulin  Pen Needle (EASY COMFORT PEN NEEDLES) 31G X 5 MM MISC USE 4 TIMES DAILY AS DIRECTED 300 each 3   insulin  regular human CONCENTRATED (HUMULIN  R U-500 KWIKPEN) 500 UNIT/ML KwikPen Inject 260 Units into the skin daily. Use up to 250-260 units of insulin  daily as advised 48 mL 1   ketoconazole  (NIZORAL ) 2 % cream Apply 1 Application topically daily. 60 g 2   losartan  (COZAAR ) 50 MG tablet TAKE 1 TABLET BY MOUTH DAILY 90 tablet 4   metolazone  (ZAROXOLYN ) 2.5 MG tablet TAKE ONE TABLET BY MOUTH ON TUESDAYS AND THURSDAYS 30 MINUTES BEFORE TAKING TORSEMIDE  AS DIRECTED 8 tablet 6   mupirocin  ointment (BACTROBAN ) 2 % Ply small amount to effected area twice daily for 10 days. 22 g 1   omeprazole  (PRILOSEC) 40 MG capsule Take 1 capsule by mouth daily.     oxyCODONE -acetaminophen  (PERCOCET) 10-325 MG tablet Take 1.5 tablets by mouth in the morning, at noon, in the evening, and at bedtime.     pantoprazole  (PROTONIX ) 40 MG tablet  Take 1 tablet (40 mg total) by mouth  daily. Take 30-60 min before first meal of the day 30 tablet 11   potassium chloride  SA (KLOR-CON  M) 20 MEQ tablet TAKE 1 TABLET BY MOUTH DAILY. TAKE AN EXTRA TABLET ON TUESDAY AND THURSDAY 30 tablet 6   primidone  (MYSOLINE ) 50 MG tablet Take 0.5 tablets (25 mg total) by mouth daily. 45 tablet 3   rosuvastatin  (CRESTOR ) 10 MG tablet Take 1 tablet (10 mg total) by mouth daily. 90 tablet 1   Semaglutide , 1 MG/DOSE, (OZEMPIC , 1 MG/DOSE,) 4 MG/3ML SOPN Inject 1 mg into the skin once a week. 9 mL 3   tacrolimus  (PROTOPIC ) 0.1 % ointment Apply topically 2 (two) times daily. 100 g 2   tamsulosin  (FLOMAX ) 0.4 MG CAPS capsule TAKE 1 CAPSULE BY MOUTH EACH NIGHT AT BEDTIME 30 capsule 11   torsemide  (DEMADEX ) 20 MG tablet TAKE 2 TABELTS BY MOUTH DAILY IN THE MORNING AND 1 IN THE EVENING 270 tablet 3   triamcinolone  cream (KENALOG ) 0.1 % Apply 1 Application topically daily as needed. 453 g 2   TRULICITY  3 MG/0.5ML SOPN INJECT 3 MGS SUBCUTANEOUSLY ONCE WEEKLY 6 mL 3   VITAMIN A PO Take 2,400 mcg by mouth daily.      Vitamin D , Ergocalciferol , (DRISDOL ) 1.25 MG (50000 UNIT) CAPS capsule Take 1 capsule (50,000 Units total) by mouth every 7 (seven) days. 12 capsule 3   warfarin (COUMADIN ) 5 MG tablet Take 1/2 to 1 tablet daily or as directed 70 tablet 1   nitroGLYCERIN  (NITROSTAT ) 0.4 MG SL tablet Place 1 tablet (0.4 mg total) under the tongue every 5 (five) minutes x 3 doses as needed for chest pain. (Patient not taking: Reported on 05/19/2023) 25 tablet 3   predniSONE  (DELTASONE ) 10 MG tablet Take 4 tablets each am x 2 days, 2 tablets each am x 2 days, 1 tablet each am x 2 days and stop (Patient not taking: Reported on 05/19/2023) 14 tablet 0   No current facility-administered medications for this visit.   Allergies  Allergen Reactions   Adhesive [Tape]     itching   Latex Itching    When tape is on the skin too long skin gets red & itching    Physical Exam: BP (!) 100/50 (BP Location: Left Arm, Patient  Position: Sitting, Cuff Size: Large)   Pulse 68   Ht 6' 1 (1.854 m)   BMI 34.70 kg/m  Constitutional: Pleasant,well-developed, male in no acute distress. HEENT: Normocephalic and atraumatic. Conjunctivae are normal. No scleral icterus. Cardiovascular: Normal rate, regular rhythm.  Pulmonary/chest: Expiratory wheezing present on both sides Abdominal: Soft, distended, nontender. Bowel sounds active throughout. There are no masses palpable. No hepatomegaly. Rectal: Anoscopy was performed by inserting the anoscope into the rectum. Bleeding grade 1 internal hemorrhoids noted on anoscopy. Extremities: Trace lower extremity edema Neurological: Left arm with tremor, though this is not clearly asterixis Skin: Skin is warm and dry. No rashes noted. Psychiatric: Normal mood and affect. Behavior is normal.  Labs 12/2022: CBC with low Hb of 11.7 and low plts 96. CMP with low potassium of 3.2, elevated alk phos of 138 and elevated Cr of 1.54. HbA1C elevated 7.4%.   Ab U/S complete 09/28/21: IMPRESSION: 1. Hepatomegaly with abnormal appearance of the parenchyma suggesting hepatic steatosis and/or other hepatocellular disease. 2. Marked splenomegaly. 3. Bilateral simple and mildly complex appearing renal cysts, as seen on previous studies.  Colonoscopy 03/02/18: - Three 3 to 5 mm polyps in the  rectum, in the sigmoid colon and in the cecum, removed with a cold snare. Resected and retrieved. - The examination was otherwise normal on direct and retroflexion views. Path: 1. Surgical [P], rectum, sigmoid and cecum, polyp (3) - TUBULAR ADENOMA (1 OF 4 FRAGMENTS) - SESSILE SERRATED POLYP (2 OF 4 FRAGMENTS) - BENIGN COLONIC MUCOSA (1 OF 4 FRAGMENTS) - NO HIGH GRADE DYSPLASIA OR MALIGNANCY IDENTIFIED  EGD 11/07/19: - Mild, non- specific gastritis. Biopsied to check for H. pylori. - No signs of portal hypertension. Path:A. STOMACH, ANTRUM AND BODY, BIOPSY:  - Gastric antral/oxyntic mucosa with mild  reactive gastropathy.  - Warthin-Starry stain is negative for Helicobacter pylori.   ASSESSMENT AND PLAN: EtOH cirrhosis Rectal bleeding Rectal pain Intermittent confusion Worsened abdominal distension Constipation Dysphagia History of colon polyps Patient presents with rectal pain that became very severe about a week ago.  He has noted some chronic rectal bleeding over time.  Rectal exam today showed bleeding internal hemorrhoids.  Thus we will start him on topical steroids to start off with.  If this is not effective, can consider hemorrhoidal banding in the future.  Patient has a history of presumed alcoholic cirrhosis and does appear to have some intermittent confusion.  Will start him on some lactulose  to try to help with both his confusion that may be due to hepatic encephalopathy and constipation issues.  For his history of cirrhosis, will also get him scheduled for an EGD for variceal screening as well as for evaluation with dysphagia.  Will also get him scheduled for an abdominal ultrasound for [facility] screening and to evaluate for ascites since patient does describe worsening abdominal distention.  Will also plan for a colonoscopy since patient is due for polyp surveillance and will look for alternative sources of rectal bleeding as well. - Anusol  HC cream BID for 7 days. If this is not effective, then hemorrhoidal banding in the future - Start lactulose  10 mg TID. Can titrate to achieve 2-3 BMs per day. - Ab U/S for [facility] screening - EGD/colonoscopy [facility] for variceal screening. Hold warfarin for 5 days, will get permission to hold  Estefana Kidney, MD  I spent 50  minutes of time, including in depth chart review, independent review of results as outlined above, communicating results with the patient directly, face-to-face time with the patient, coordinating care, ordering studies and medications as appropriate, and documentation.

## 2023-05-19 NOTE — Patient Instructions (Signed)
 You have been scheduled for an abdominal ultrasound at [facility] (1st floor of hospital) on 05/22/23 at 8:30 am. Please arrive 30 minutes prior to your appointment for registration. Make certain not to have anything to eat or drink 6 hours prior to your appointment. Should you need to reschedule your appointment, please contact radiology at [phone]. This test typically takes about 30 minutes to perform.  You have been scheduled for an endoscopy and colonoscopy. Please follow the written instructions given to you at your visit today.  Please pick up your prep supplies at the pharmacy within the next 1-3 days.  If you use inhalers (even only as needed), please bring them with you on the day of your procedure.  DO NOT TAKE 7 DAYS PRIOR TO TEST- Trulicity  (dulaglutide ) Ozempic , Wegovy (semaglutide ) Mounjaro (tirzepatide) Bydureon Bcise (exanatide extended release)  DO NOT TAKE 1 DAY PRIOR TO YOUR TEST Rybelsus (semaglutide ) Adlyxin (lixisenatide) Victoza  (liraglutide ) Byetta (exanatide) ___________________________________________________________________________  We have sent the following medications to your pharmacy for you to pick up at your convenience: Suprep, Lactulose , Anusol   You will be contaced by our office prior to your procedure for directions on holding your Coumadin /Warfarin.  If you do not hear from our office 1 week prior to your scheduled procedure, please call [phone] to discuss.   If your blood pressure at your visit was 140/90 or greater, please contact your primary care physician to follow up on this.  _______________________________________________________  If you are age 29 or older, your body mass index should be between 23-30. Your Body mass index is 34.7 kg/m. If this is out of the aforementioned range listed, please consider follow up with your Primary Care Provider.  If you are age 38 or younger, your body mass index should be between  19-25. Your Body mass index is 34.7 kg/m. If this is out of the aformentioned range listed, please consider follow up with your Primary Care Provider.   ________________________________________________________  The [place] [facility] providers would like to encourage you to use MYCHART to communicate with providers for non-urgent requests or questions.  Due to long hold times on the telephone, sending your provider a message by [facility] may be a faster and more efficient way to get a response.  Please allow 48 business hours for a response.  Please remember that this is for non-urgent requests.  _______________________________________________________   Due to recent changes in healthcare laws, you may see the results of your imaging and laboratory studies on MyChart before your provider has had a chance to review them.  We understand that in some cases there may be results that are confusing or concerning to you. Not all laboratory results come back in the same time frame and the provider may be waiting for multiple results in order to interpret others.  Please give us  48 hours in order for your provider to thoroughly review all the results before contacting the office for clarification of your results.    Thank you for entrusting me with your care and for choosing [facility], Dr. Estefana Kidney

## 2023-05-19 NOTE — Telephone Encounter (Signed)
 Patient with diagnosis of afib on warfarin for anticoagulation.    Procedure: Endoscopy and Colonoscopy  Date of procedure: 06/29/23   CHA2DS2-VASc Score = 4   This indicates a 4.8% annual risk of stroke. The patient's score is based upon: CHF History: 0 HTN History: 1 Diabetes History: 1 Stroke History: 0 Vascular Disease History: 1 Age Score: 1 Gender Score: 0       Per office protocol, patient can hold warfarin for 5 days prior to procedure.    Patient will NOT need bridging with Lovenox  (enoxaparin ) around procedure.  **This guidance is not considered finalized until pre-operative APP has relayed final recommendations.**

## 2023-05-22 ENCOUNTER — Ambulatory Visit (HOSPITAL_COMMUNITY): Payer: Medicare Other

## 2023-05-22 NOTE — Telephone Encounter (Signed)
 Dr. Lavona,  Mr. Hallum is requesting preoperative cardiac evaluation for EGD and colonoscopy.  His procedure scheduled for 06/29/2023.  He was recently seen by you in clinic on 03/29/2023.  Would you be able to provide recommendations on cardiac risk for his upcoming procedure?  Thank you for your help.  Please direct your response to CV DIV preop pool.  Josefa HERO. Jerri Hargadon NP-C     05/22/2023, 9:47 AM [facility] [address] Office [phone] Fax [phone]

## 2023-05-23 ENCOUNTER — Ambulatory Visit: Payer: Medicare Other | Admitting: Orthopaedic Surgery

## 2023-05-24 ENCOUNTER — Telehealth: Payer: Self-pay

## 2023-05-24 NOTE — Telephone Encounter (Signed)
 Spoke to patient advised received fax from cardiology okay to hold Warfarin 5 days prior to procedure. Patient verbalized understanding.

## 2023-05-24 NOTE — Telephone Encounter (Signed)
   Patient Name: [name]  DOB: January 07, 1956 MRN: [id number]  Primary Cardiologist: Lynwood Schilling, MD  Chart revisited as part of pre-operative protocol coverage.  Per Dr. Schilling, patient may proceed with GI procedure as requested. Per pharmacist recommendation, per office protocol, patient can hold warfarin for 5 days prior to procedure. Patient will NOT need bridging with Lovenox  (enoxaparin ) around procedure.   Will route this bundled recommendation to requesting provider via Epic fax function. Please call with questions.  Tesslyn Baumert N Dekayla Prestridge, PA-C 05/24/2023, 9:36 AM

## 2023-05-25 ENCOUNTER — Other Ambulatory Visit: Payer: Self-pay | Admitting: Cardiology

## 2023-05-25 DIAGNOSIS — I480 Paroxysmal atrial fibrillation: Secondary | ICD-10-CM

## 2023-05-25 NOTE — Telephone Encounter (Signed)
 Warfarin 5mg  refill  Afib Last INR 05/18/23 Last OV 03/29/23

## 2023-06-07 ENCOUNTER — Ambulatory Visit: Payer: Medicare Other | Admitting: Nurse Practitioner

## 2023-06-13 ENCOUNTER — Ambulatory Visit (HOSPITAL_COMMUNITY): Admission: RE | Admit: 2023-06-13 | Payer: Medicare Other | Source: Ambulatory Visit

## 2023-06-16 ENCOUNTER — Encounter: Payer: Self-pay | Admitting: Internal Medicine

## 2023-06-16 ENCOUNTER — Ambulatory Visit: Payer: Medicare Other | Admitting: Internal Medicine

## 2023-06-16 VITALS — BP 124/70 | HR 83 | Ht 73.0 in

## 2023-06-16 DIAGNOSIS — E785 Hyperlipidemia, unspecified: Secondary | ICD-10-CM | POA: Diagnosis not present

## 2023-06-16 DIAGNOSIS — N1831 Chronic kidney disease, stage 3a: Secondary | ICD-10-CM

## 2023-06-16 DIAGNOSIS — Z794 Long term (current) use of insulin: Secondary | ICD-10-CM

## 2023-06-16 DIAGNOSIS — E1122 Type 2 diabetes mellitus with diabetic chronic kidney disease: Secondary | ICD-10-CM

## 2023-06-16 DIAGNOSIS — E278 Other specified disorders of adrenal gland: Secondary | ICD-10-CM | POA: Diagnosis not present

## 2023-06-16 MED ORDER — LANTUS SOLOSTAR 100 UNIT/ML ~~LOC~~ SOPN: 75.0000 [IU] | PEN_INJECTOR | Freq: Every day | SUBCUTANEOUS | 3 refills | Status: AC
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
## 2023-06-16 NOTE — Patient Instructions (Addendum)
Please continue: - U500 insulin - 30 min before meals 150 units in the morning  130 units in the evening - Ozempic 1 mg weekly  Please increase: - Lantus 75 units daily at bedtime  STOP sweet drinks completely!  Please come back for the cortisol test. We check do this at the next appointment if you schedule it at 8-9 am.  Please return in 1.5 months.

## 2023-06-20 ENCOUNTER — Other Ambulatory Visit: Payer: Self-pay

## 2023-06-20 ENCOUNTER — Emergency Department (HOSPITAL_COMMUNITY): Payer: Medicare Other

## 2023-06-20 ENCOUNTER — Encounter (HOSPITAL_COMMUNITY): Payer: Self-pay

## 2023-06-20 ENCOUNTER — Emergency Department (HOSPITAL_COMMUNITY)
Admission: EM | Admit: 2023-06-20 | Discharge: 2023-06-20 | Disposition: A | Discharge status: Home / Self Care | Payer: Medicare Other | Attending: Emergency Medicine | Admitting: Emergency Medicine

## 2023-06-20 ENCOUNTER — Ambulatory Visit (HOSPITAL_COMMUNITY)
Admission: RE | Admit: 2023-06-20 | Discharge: 2023-06-20 | Disposition: A | Discharge status: Home / Self Care | Payer: Medicare Other | Source: Ambulatory Visit | Attending: Internal Medicine | Admitting: Internal Medicine

## 2023-06-20 DIAGNOSIS — C220 Liver cell carcinoma: Secondary | ICD-10-CM | POA: Diagnosis not present

## 2023-06-20 DIAGNOSIS — R1084 Generalized abdominal pain: Secondary | ICD-10-CM | POA: Diagnosis not present

## 2023-06-20 DIAGNOSIS — R0602 Shortness of breath: Secondary | ICD-10-CM | POA: Insufficient documentation

## 2023-06-20 DIAGNOSIS — R1310 Dysphagia, unspecified: Secondary | ICD-10-CM

## 2023-06-20 DIAGNOSIS — K625 Hemorrhage of anus and rectum: Secondary | ICD-10-CM | POA: Diagnosis present

## 2023-06-20 DIAGNOSIS — Z860100 Personal history of colon polyps, unspecified: Secondary | ICD-10-CM

## 2023-06-20 DIAGNOSIS — K7030 Alcoholic cirrhosis of liver without ascites: Secondary | ICD-10-CM

## 2023-06-20 DIAGNOSIS — K6289 Other specified diseases of anus and rectum: Secondary | ICD-10-CM

## 2023-06-20 DIAGNOSIS — Z91040 Latex allergy status: Secondary | ICD-10-CM | POA: Diagnosis not present

## 2023-06-20 DIAGNOSIS — N179 Acute kidney failure, unspecified: Secondary | ICD-10-CM

## 2023-06-20 DIAGNOSIS — R188 Other ascites: Secondary | ICD-10-CM

## 2023-06-20 DIAGNOSIS — Z794 Long term (current) use of insulin: Secondary | ICD-10-CM | POA: Diagnosis not present

## 2023-06-20 DIAGNOSIS — R109 Unspecified abdominal pain: Secondary | ICD-10-CM | POA: Diagnosis present

## 2023-06-20 LAB — TROPONIN I (HIGH SENSITIVITY)
Troponin I (High Sensitivity): 11 ng/L (ref <18)
Troponin I (High Sensitivity): 11 ng/L (ref <18)

## 2023-06-20 LAB — COMPREHENSIVE METABOLIC PANEL
ALT: 36 U/L (ref 0–44)
AST: 74 U/L — ABNORMAL HIGH (ref 15–41)
Albumin: 3.8 g/dL (ref 3.5–5.0)
Alkaline Phosphatase: 182 U/L — ABNORMAL HIGH (ref 38–126)
Anion gap: 12 (ref 5–15)
BUN: 31 mg/dL — ABNORMAL HIGH (ref 8–23)
CO2: 29 mmol/L (ref 22–32)
Calcium: 9.4 mg/dL (ref 8.9–10.3)
Chloride: 100 mmol/L (ref 98–111)
Creatinine, Ser: 1.84 mg/dL — ABNORMAL HIGH (ref 0.61–1.24)
GFR, Estimated: 40 mL/min — ABNORMAL LOW (ref >60)
Glucose, Bld: 260 mg/dL — ABNORMAL HIGH (ref 70–99)
Potassium: 3.9 mmol/L (ref 3.5–5.1)
Sodium: 141 mmol/L (ref 135–145)
Total Bilirubin: 2.1 mg/dL — ABNORMAL HIGH (ref 0.0–1.2)
Total Protein: 7.7 g/dL (ref 6.5–8.1)

## 2023-06-20 LAB — CBC WITH DIFFERENTIAL/PLATELET
Abs Immature Granulocytes: 0.02 10*3/uL (ref 0.00–0.07)
Basophils Absolute: 0 10*3/uL (ref 0.0–0.1)
Basophils Relative: 1 %
Eosinophils Absolute: 0.1 10*3/uL (ref 0.0–0.5)
Eosinophils Relative: 2 %
HCT: 34.4 % — ABNORMAL LOW (ref 39.0–52.0)
Hemoglobin: 10.7 g/dL — ABNORMAL LOW (ref 13.0–17.0)
Immature Granulocytes: 0 %
Lymphocytes Relative: 12 %
Lymphs Abs: 0.7 10*3/uL (ref 0.7–4.0)
MCH: 29.6 pg (ref 26.0–34.0)
MCHC: 31.1 g/dL (ref 30.0–36.0)
MCV: 95 fL (ref 80.0–100.0)
Monocytes Absolute: 0.5 10*3/uL (ref 0.1–1.0)
Monocytes Relative: 10 %
Neutro Abs: 4 10*3/uL (ref 1.7–7.7)
Neutrophils Relative %: 75 %
Platelets: 94 10*3/uL — ABNORMAL LOW (ref 150–400)
RBC: 3.62 MIL/uL — ABNORMAL LOW (ref 4.22–5.81)
RDW: 17.3 % — ABNORMAL HIGH (ref 11.5–15.5)
WBC: 5.3 10*3/uL (ref 4.0–10.5)
nRBC: 0 % (ref 0.0–0.2)

## 2023-06-20 LAB — LIPASE, BLOOD: Lipase: 28 U/L (ref 11–51)

## 2023-06-20 LAB — BRAIN NATRIURETIC PEPTIDE: B Natriuretic Peptide: 104.1 pg/mL — ABNORMAL HIGH (ref 0.0–100.0)

## 2023-06-20 MED ORDER — SODIUM CHLORIDE 0.9 % IV BOLUS
1000.0000 mL | Freq: Once | INTRAVENOUS | Status: AC
  Administered 2023-06-20: 1000 mL via INTRAVENOUS

## 2023-06-20 MED ORDER — IOHEXOL 300 MG/ML  SOLN
80.0000 mL | Freq: Once | INTRAMUSCULAR | Status: AC | PRN
  Administered 2023-06-20: 80 mL via INTRAVENOUS

## 2023-06-20 MED ORDER — SODIUM CHLORIDE 0.9 % IV SOLN: INTRAVENOUS | Status: DC

## 2023-06-20 NOTE — ED Triage Notes (Signed)
BIB EMS from home for abdominal pain. Pt had outpatient [place] today of abdomen. Pt is having increased pain. Nausea, denies diarrhea or vomiting.

## 2023-06-20 NOTE — ED Provider Triage Note (Signed)
 Emergency Medicine Provider Triage Evaluation Note  TRAYCEN GOYER , a 68 y.o. male  was evaluated in triage.  Pt complains of generalized abdominal pain had for years, but worse today, presents w/ abdominal swelling. US  today showed hepatomegaly w/ increased hepatic echotexture, steatosis, splenomegaly, bilateral renal cysts.  Hx of CHF< PAF, COPD, HTN, DM,   Endorses chills, sweats, nausea, chronic shortness of breath, dysuria, LE swelling.  Denies fevers, chest pain, vomiting.   Review of Systems  Positive: N/a Negative: N/a  Physical Exam  BP (!) 113/54 (BP Location: Left Arm)   Pulse 67   Temp 97.6 F (36.4 C) (Oral)   Resp 16   Ht 6' 1 (1.854 m)   Wt 113.4 kg   SpO2 95%   BMI 32.98 kg/m  Gen:   Awake, no distress   Resp:  Normal effort  MSK:   Moves extremities without difficulty  Other:    Medical Decision Making  Medically screening exam initiated at 2:14 PM.  Appropriate orders placed.  Garnette JONETTA Murrain was informed that the remainder of the evaluation will be completed by another provider, this initial triage assessment does not replace that evaluation, and the importance of remaining in the ED until their evaluation is complete.     Beola Terrall RAMAN, [place] 06/20/23 1421

## 2023-06-20 NOTE — Discharge Instructions (Signed)
 You were offered inpatient admission to have fluid in your abdomen drained which you have deferred at this time.  Call your doctor tomorrow morning to schedule this to be done as an outpatient.  Feel free to return if you change your mind.  Also follow-up for repeat kidney function test

## 2023-06-20 NOTE — ED Provider Notes (Addendum)
 [place] EMERGENCY DEPARTMENT AT [facility] Provider Note   CSN: [id number] Arrival date & time: 06/20/23  1245     History  Chief Complaint  Patient presents with   Abdominal Pain    [name] is a 68 y.o. male.  This is a 68 year old male with a history of cirrhosis presents due to increased abdominal distention as well as pain.  Patient states that he has had chronic abdominal pain for quite some time.  Notes that his belly has become more distended.  Denies any fever or chills.  No vomiting or hematemesis.  Denies any dark or bloody stools.  No increased dyspnea on exertion.  Had outpatient ultrasound today and came here due to increasing discomfort       Home Medications Prior to Admission medications   Medication Sig Start Date End Date Taking? Authorizing Provider  albuterol  (PROVENTIL ) (2.5 MG/3ML) 0.083% nebulizer solution Take 3 mLs (2.5 mg total) by nebulization every 6 (six) hours as needed for wheezing or shortness of breath. 10/29/21   Cobb, Comer GAILS, NP  albuterol  (VENTOLIN  HFA) 108 (90 Base) MCG/ACT inhaler INHALE 1-2 PUFFS INTO THE LUNGS EVERY 6 HOURS AS NEEDED FOR WHEEZING OR SHORTNESS OF BREATH 11/03/22   Parrett, Madelin RAMAN, NP  b complex vitamins tablet Take 1 tablet by mouth daily.     [provider]  bisoprolol  (ZEBETA ) 10 MG tablet Take 1 tablet (10 mg total) by mouth daily. 08/09/22   Boscia, [name] E, NP  clobetasol  cream (TEMOVATE ) 0.05 % Apply 1 Application topically 2 (two) times daily. 10/24/22   Boscia, [name] E, NP  clotrimazole -betamethasone  (LOTRISONE ) cream Apply 1 Application topically daily. 12/14/22   Sikora, Rebecca, DPM  colchicine  0.6 MG tablet TAKE 2 TABLETS BY MOUTH AT ONCE. THEN TAKE 1 TABLET 1 HOUR LATER. WAIT 2 DAYS, THEN BEGIN TAKING 1 TABLET DAILY TO PREVENT ACUTE GOUT. 09/22/22   Hanford Powell BRAVO, NP  Continuous Glucose Receiver (DEXCOM G7 RECEIVER) DEVI See admin instructions. 10/20/22   [provider]   Continuous Glucose Sensor (DEXCOM G7 SENSOR) MISC 3 each by Does not apply route every 30 (thirty) days. Apply 1 sensor every 10 days 03/01/23   Trixie File, MD  dexamethasone  (DECADRON ) 1 MG tablet Take 1 tablet by mouth once at 11 pm, before coming for labs at 8 am the next morning 05/11/23   Trixie File, MD  diltiazem  (CARDIZEM  CD) 120 MG 24 hr capsule TAKE 1 CAPSULE BY MOUTH DAILY 11/23/22   Lavona Agent, MD  famotidine  (PEPCID ) 20 MG tablet One after supper 08/04/22   Darlean Ozell NOVAK, MD  febuxostat  (ULORIC ) 40 MG tablet TAKE 1 TABLET BY MOUTH DAILY 12/09/22   Chandra Toribio POUR, MD  Fluticasone -Umeclidin-Vilant (TRELEGY ELLIPTA ) 100-62.5-25 MCG/ACT AEPB Inhale 1 puff into the lungs every morning. 09/28/22   Darlean Ozell NOVAK, MD  Fluticasone -Umeclidin-Vilant (TRELEGY ELLIPTA ) 100-62.5-25 MCG/ACT AEPB One click each am 09/28/22   Darlean Ozell NOVAK, MD  gabapentin  (NEURONTIN ) 300 MG capsule Take 3 capsules (900 mg total) by mouth 3 (three) times daily. TAKE 3 CAPSULES BY MOUTH THREE TIMES DAILY FOR NEUROPATHY 04/03/23   Leigh Venetia CROME, MD  Glucagon  3 MG/DOSE POWD Place 3 mg into the nose once as needed for up to 1 dose. 01/12/22   Trixie File, MD  insulin  glargine (LANTUS  SOLOSTAR) 100 UNIT/ML Solostar Pen Inject 75 Units into the skin daily. 06/16/23   Trixie File, MD  Insulin  Pen Needle (EASY COMFORT PEN NEEDLES)  31G X 5 MM MISC USE 4 TIMES DAILY AS DIRECTED 01/25/23   Trixie File, MD  insulin  regular human CONCENTRATED (HUMULIN  R U-500 KWIKPEN) 500 UNIT/ML KwikPen Inject 260 Units into the skin daily. Use up to 250-260 units of insulin  daily as advised 07/04/22   Trixie File, MD  ketoconazole  (NIZORAL ) 2 % cream Apply 1 Application topically daily. 01/31/22   Hanford Powell BRAVO, NP  lactulose  (CEPHULAC ) 10 g packet Take 1 packet (10 g total) by mouth 3 (three) times daily. 05/19/23   Federico Rosario BROCKS, MD  losartan  (COZAAR ) 50 MG tablet TAKE 1 TABLET BY MOUTH DAILY 09/12/22    Boscia, [name] E, NP  metolazone  (ZAROXOLYN ) 2.5 MG tablet TAKE ONE TABLET BY MOUTH ON TUESDAYS AND THURSDAYS 30 MINUTES BEFORE TAKING TORSEMIDE  AS DIRECTED 04/07/23   Lavona Agent, MD  mupirocin  ointment (BACTROBAN ) 2 % Ply small amount to effected area twice daily for 10 days. 02/17/22   Hanford Powell BRAVO, NP  Na Sulfate-K Sulfate-Mg Sulf 17.5-3.13-1.6 GM/177ML SOLN Use as directed; may use generic; goodrx card if insurance will not cover generic 05/19/23   Federico Rosario BROCKS, MD  nitroGLYCERIN  (NITROSTAT ) 0.4 MG SL tablet Place 1 tablet (0.4 mg total) under the tongue every 5 (five) minutes x 3 doses as needed for chest pain. 09/05/22   Hanford Powell BRAVO, NP  omeprazole  (PRILOSEC) 40 MG capsule Take 1 capsule by mouth daily. 02/01/23   [provider]  oxyCODONE -acetaminophen  (PERCOCET) 10-325 MG tablet Take 1.5 tablets by mouth in the morning, at noon, in the evening, and at bedtime. 03/10/20   [provider]  pantoprazole  (PROTONIX ) 40 MG tablet Take 1 tablet (40 mg total) by mouth daily. Take 30-60 min before first meal of the day 08/04/22   Darlean Ozell NOVAK, MD  potassium chloride  SA (KLOR-CON  M) 20 MEQ tablet TAKE 1 TABLET BY MOUTH DAILY. TAKE AN EXTRA TABLET ON TUESDAY AND THURSDAY 01/31/23   Lavona Agent, MD  predniSONE  (DELTASONE ) 10 MG tablet Take 4 tablets each am x 2 days, 2 tablets each am x 2 days, 1 tablet each am x 2 days and stop 04/17/23   Wert, Michael B, MD  primidone  (MYSOLINE ) 50 MG tablet Take 0.5 tablets (25 mg total) by mouth daily. 01/05/23   Leigh Venetia CROME, MD  rosuvastatin  (CRESTOR ) 10 MG tablet Take 1 tablet (10 mg total) by mouth daily. 11/01/22   Hanford Powell BRAVO, NP  Semaglutide , 1 MG/DOSE, (OZEMPIC , 1 MG/DOSE,) 4 MG/3ML SOPN Inject 1 mg into the skin once a week. 05/04/23   Trixie File, MD  tacrolimus  (PROTOPIC ) 0.1 % ointment Apply topically 2 (two) times daily. 11/03/22   Boscia, [name] E, NP  tamsulosin  (FLOMAX ) 0.4 MG CAPS capsule TAKE 1 CAPSULE  BY MOUTH EACH NIGHT AT BEDTIME 07/15/21   McKenzie, Belvie CROME, MD  torsemide  (DEMADEX ) 20 MG tablet TAKE 2 TABELTS BY MOUTH DAILY IN THE MORNING AND 1 IN THE EVENING 04/24/23   Lavona Agent, MD  triamcinolone  cream (KENALOG ) 0.1 % Apply 1 Application topically daily as needed. 11/30/21   Boscia, [name] E, NP  VITAMIN A PO Take 2,400 mcg by mouth daily.     [provider]  Vitamin D , Ergocalciferol , (DRISDOL ) 1.25 MG (50000 UNIT) CAPS capsule Take 1 capsule (50,000 Units total) by mouth every 7 (seven) days. 11/01/22   Boscia, [name] E, NP  warfarin (COUMADIN ) 5 MG tablet Take 1/2 to 1 tablet daily or as directed 05/25/23   Lavona Agent, MD  Allergies    Adhesive [tape] and Latex    Review of Systems   Review of Systems  All other systems reviewed and are negative.   Physical Exam Updated Vital Signs BP 105/64   Pulse 70   Temp 98.4 F (36.9 C) (Oral)   Resp 14   Ht 1.854 m (6' 1)   Wt 113.4 kg   SpO2 94%   BMI 32.98 kg/m  Physical Exam Vitals and nursing note reviewed.  Constitutional:      General: He is not in acute distress.    Appearance: Normal appearance. He is well-developed. He is not toxic-appearing.  HENT:     Head: Normocephalic and atraumatic.  Eyes:     General: Lids are normal.     Conjunctiva/sclera: Conjunctivae normal.     Pupils: Pupils are equal, round, and reactive to light.  Neck:     Thyroid : No thyroid  mass.     Trachea: No tracheal deviation.  Cardiovascular:     Rate and Rhythm: Normal rate and regular rhythm.     Heart sounds: Normal heart sounds. No murmur heard.    No gallop.  Pulmonary:     Effort: Pulmonary effort is normal. No respiratory distress.     Breath sounds: Normal breath sounds. No stridor. No decreased breath sounds, wheezing, rhonchi or rales.  Abdominal:     General: There is no distension.     Palpations: Abdomen is soft.     Tenderness: There is generalized abdominal tenderness. There is no rebound.      Comments: No peritoneal signs noted  Musculoskeletal:        General: No tenderness. Normal range of motion.     Cervical back: Normal range of motion and neck supple.  Skin:    General: Skin is warm and dry.     Findings: No abrasion or rash.  Neurological:     Mental Status: He is alert and oriented to person, place, and time. Mental status is at baseline.     GCS: GCS eye subscore is 4. GCS verbal subscore is 5. GCS motor subscore is 6.     Cranial Nerves: No cranial nerve deficit.     Sensory: No sensory deficit.     Motor: Motor function is intact.  Psychiatric:        Attention and Perception: Attention normal.        Speech: Speech normal.        Behavior: Behavior normal.     ED Results / Procedures / Treatments   Labs (all labs ordered are listed, but only abnormal results are displayed) Labs Reviewed  CBC WITH DIFFERENTIAL/PLATELET - Abnormal; Notable for the following components:      Result Value   RBC 3.62 (*)    Hemoglobin 10.7 (*)    HCT 34.4 (*)    RDW 17.3 (*)    Platelets 94 (*)    All other components within normal limits  COMPREHENSIVE METABOLIC PANEL - Abnormal; Notable for the following components:   Glucose, Bld 260 (*)    BUN 31 (*)    Creatinine, Ser 1.84 (*)    AST 74 (*)    Alkaline Phosphatase 182 (*)    Total Bilirubin 2.1 (*)    GFR, Estimated 40 (*)    All other components within normal limits  BRAIN NATRIURETIC PEPTIDE - Abnormal; Notable for the following components:   B Natriuretic Peptide 104.1 (*)    All other components within normal  limits  LIPASE, BLOOD  URINALYSIS, ROUTINE W REFLEX MICROSCOPIC  TROPONIN I (HIGH SENSITIVITY)  TROPONIN I (HIGH SENSITIVITY)    EKG EKG Interpretation Date/Time:  Tuesday June 20 2023 14:42:01 EST Ventricular Rate:  72 PR Interval:    QRS Duration:  93 QT Interval:  416 QTC Calculation: 426 R Axis:   31  Text Interpretation: Atrial fibrillation Ventricular premature complex ST  elevation, consider lateral injury No significant change since last tracing Confirmed by Dasie Faden ([id number]) on 06/20/2023 7:53:46 PM  Radiology CT ABDOMEN PELVIS W CONTRAST Result Date: 06/20/2023 CLINICAL DATA:  Abdomen pain EXAM: CT ABDOMEN AND PELVIS WITH CONTRAST TECHNIQUE: Multidetector CT imaging of the abdomen and pelvis was performed using the standard protocol following bolus administration of intravenous contrast. RADIATION DOSE REDUCTION: This exam was performed according to the departmental dose-optimization program which includes automated exposure control, adjustment of the mA and/or kV according to patient size and/or use of iterative reconstruction technique. CONTRAST:  80mL OMNIPAQUE  IOHEXOL  300 MG/ML  SOLN COMPARISON:  Ultrasound 06/20/2023, CT 12/08/2017 FINDINGS: Lower chest: Lung bases demonstrate no acute airspace disease. Cardiomegaly with coronary vascular calcification. Mitral calcification. Hepatobiliary: Liver cirrhosis. Vague geographic hypodensity in the left hepatic lobe. Distended gallbladder with hyperdense sludge. No biliary dilatation. Pancreas: Unremarkable. No pancreatic ductal dilatation or surrounding inflammatory changes. Spleen: Enlarged, craniocaudal measurement of 22 cm. Adrenals/Urinary Tract: Adrenal glands are within normal limits. Kidneys show no hydronephrosis. Small kidney stones. Areas of cortical scarring in the kidneys. Simple and non simple cystic lesions in the kidneys. Bladder is unremarkable Stomach/Bowel: Stomach nonenlarged. No dilated small bowel. No acute bowel wall thickening. Vascular/Lymphatic: Advanced aortic atherosclerosis. No aneurysm. Multiple small retroperitoneal lymph nodes, 19 mm lymph node adjacent to the left renal vein, series 2, image 43. Mildly enlarged porta hepatis nodes measuring up to 13 mm. Small gastrohepatic nodes. Increased collateral vessels in the right abdomen Reproductive: Negative prostate Other: No free air.  Trace free  fluid in the pelvis. Musculoskeletal: Chronic compression deformity at L2. Multilevel degenerative change IMPRESSION: 1. Liver cirrhosis with evidence of portal hypertension including splenomegaly and increased collateral vessels. Vague geographic hypodensity most conspicuous in the left hepatic lobe, recommend further evaluation with nonemergent MRI to exclude liver mass. 2. Distended gallbladder with hyperdense sludge. 3. Nonobstructive kidney stones. Simple and non simple cystic lesions in the kidneys. These may also be assessed at nonemergent MRI follow-up. 4. Mild nonspecific retroperitoneal, porta hepatis and gastrohepatic adenopathy. 5. Aortic atherosclerosis. Aortic Atherosclerosis (ICD10-I70.0). Electronically Signed   By: Luke Bun M.D.   On: 06/20/2023 16:52   DG Chest 2 View Result Date: 06/20/2023 CLINICAL DATA:  Shortness of breath and generalized abdominal pain. EXAM: CHEST - 2 VIEW COMPARISON:  Chest radiograph dated 10/29/2021. FINDINGS: No focal consolidation, pleural effusion, or pneumothorax. Mild cardiomegaly. Atherosclerotic calcification of the aortic arch. No acute osseous pathology. IMPRESSION: 1. No active cardiopulmonary disease. 2. Mild cardiomegaly. Electronically Signed   By: Vanetta Chou M.D.   On: 06/20/2023 15:50   US  Abdomen Complete Result Date: 06/20/2023 : PROCEDURE: ULTRASOUND ABDOMEN COMPLETE HISTORY: Patient is a 68 y/o  Male with HCC screening COMPARISON: U/S abdomen 09/28/2021 TECHNIQUE: Two-dimensional grayscale and color Doppler ultrasound of the abdomen was performed. FINDINGS: The pancreas is partially obscured by overlying bowel gas. The visualized segments appear unremarkable. The liver appears enlarged and demonstrates an increased and heterogeneous echotexture without intrahepatic biliary dilatation. No masses are visualized. The main portal vein demonstrates normal hepatopedal flow. The gallbladder demonstrates normal anechoic  echotexture without  pericholecystic fluid or wall thickening. There are no gallstones. The common bile duct measures 0.4 cm. Negative sonographic Murphy's sign. The right kidney measures 13.5 cm in length. Renal cortical echotexture is normal. There is no hydronephrosis. There are no stones. There are multiple cysts with the largest measuring 3.5 cm. The left kidney measures 13.4 cm in length. Renal cortical echotexture is normal. There is no hydronephrosis. There are no stones. There are multiple cysts with the largest, a septated cyst, measuring 1.9 x 1.8 cm at the inferior pole. The spleen is enlarged and measures 16.7 cm in length. It demonstrates a normal echotexture. There is no evidence of aneurysm within the visualized segments of the abdominal aorta. The visualized segments of the IVC are unremarkable. IMPRESSION: 1. Hepatomegaly with increased hepatic echotexture, most commonly seen with steatosis. Correlation with LFT's is recommended. 2.  Splenomegaly. 3.  Bilateral renal cysts. Thank you for allowing us  to assist in the care of this patient. Electronically Signed   By: Lynwood Mains M.D.   On: 06/20/2023 12:43    Procedures Procedures    Medications Ordered in ED Medications  iohexol  (OMNIPAQUE ) 300 MG/ML solution 80 mL (80 mLs Intravenous Contrast Given 06/20/23 1539)    ED Course/ Medical Decision Making/ A&P                                 Medical Decision Making Risk Prescription drug management.  Patient is EKG shows A-fib which is unchanged from prior with controlled rate. Patient's labs significant for mild acute kidney injury with creatinine 1.84.  Will give IV fluids for this.  Patient is diffusely tender on his abdomen but he states this is chronic for him.  He has been afebrile.  His white count is normal.  Cardiac enzymes normal.  Low suspicion for ACS.  Low suspicion for SBP but does remain in the differential.  Abdominal CT per my review interpretation shows liver cirrhosis.  Does have a  suspicious mass at his left hepatic lobe.  Some gallbladder distention was noted.  Prior ultrasound done earlier today showed no evidence of gallstones.  Patient offered admission for paracentesis which she has deferred at this time.  Small risk of infection discussed with patient and he states he will follow-up with his doctor tomorrow.  Family at bedside for this conversation.  Patient instructed to follow-up with his doctor also for repeat kidney function   CRITICAL CARE Performed by: Curtistine ONEIDA Dawn Total critical care time: 50 minutes Critical care time was exclusive of separately billable procedures and treating other patients. Critical care was necessary to treat or prevent imminent or life-threatening deterioration. Critical care was time spent personally by me on the following activities: development of treatment plan with patient and/or surrogate as well as nursing, discussions with consultants, evaluation of patient's response to treatment, examination of patient, obtaining history from patient or surrogate, ordering and performing treatments and interventions, ordering and review of laboratory studies, ordering and review of radiographic studies, pulse oximetry and re-evaluation of patient's condition.       Final Clinical Impression(s) / ED Diagnoses Final diagnoses:  None    Rx / DC Orders ED Discharge Orders     None         Dawn Curtistine, MD 06/20/23 2149    Dawn Curtistine, MD 06/20/23 2149

## 2023-06-21 ENCOUNTER — Encounter: Payer: Self-pay | Admitting: Internal Medicine

## 2023-06-21 ENCOUNTER — Telehealth: Payer: Self-pay | Admitting: Internal Medicine

## 2023-06-21 NOTE — Telephone Encounter (Signed)
 Inbound call from patient stating that he went to the hospital yesterday and they advised that he call and have Dr. Rosaline Coma review the notes from the hospital. Patient states that it is urgent. Please advise.

## 2023-06-21 NOTE — Telephone Encounter (Signed)
 Spoke with the patient and the spouse. The patient went to the ER because his abdomen was hurting into his back. He came back home, declining admission. During the ER visit he had labs and imaging done. The patient does not feel any better today. About the same. Less appetite. His breathing is about the usual for me. No changes in his medications.

## 2023-06-22 ENCOUNTER — Other Ambulatory Visit: Payer: Self-pay

## 2023-06-22 ENCOUNTER — Telehealth: Payer: Self-pay | Admitting: Internal Medicine

## 2023-06-22 ENCOUNTER — Encounter: Payer: Self-pay | Admitting: Internal Medicine

## 2023-06-22 DIAGNOSIS — K7031 Alcoholic cirrhosis of liver with ascites: Secondary | ICD-10-CM

## 2023-06-22 DIAGNOSIS — K769 Liver disease, unspecified: Secondary | ICD-10-CM

## 2023-06-22 DIAGNOSIS — R1084 Generalized abdominal pain: Secondary | ICD-10-CM

## 2023-06-22 NOTE — Telephone Encounter (Signed)
 Noted. Printed prep instructions left for the patient at the 2nd floor front desk for patient pick up.

## 2023-06-22 NOTE — Telephone Encounter (Signed)
 Inbound call from patient requesting a call to discuss previous notes. Please advise, thank you.

## 2023-06-22 NOTE — Telephone Encounter (Signed)
 Spoke to [name]. He states that his abdominal pain has improved back to his baseline. He still feels a little weak in his legs. He had not been taking his lactulose  but will start taking it now. Since his ab pain has improved back to baseline, I think it would be okay to proceed with EGD/colonoscopy next week. Unfortunately he gave his prep instructions to the ambulance providers due to his recent ED visit so he needs new prep instructions for his EGD/colonoscopy next week. He can come into clinic to pick them up tomorrow.

## 2023-06-22 NOTE — Telephone Encounter (Signed)
 Spoke with the patient. A copy of his instructions will be ready for him to pick up at the 2nd floor front desk. Encouraged to begin the lactulose  which should help with his complaints of constipation. He agrees to have the MRI if it is done in the open MRI at [facility] Orders directed to [facility].

## 2023-06-23 ENCOUNTER — Ambulatory Visit
Admission: RE | Admit: 2023-06-23 | Discharge: 2023-06-23 | Disposition: A | Discharge status: Home / Self Care | Payer: Medicare Other | Source: Ambulatory Visit | Attending: Internal Medicine | Admitting: Internal Medicine

## 2023-06-23 ENCOUNTER — Telehealth: Payer: Self-pay | Admitting: Internal Medicine

## 2023-06-23 DIAGNOSIS — K7031 Alcoholic cirrhosis of liver with ascites: Secondary | ICD-10-CM

## 2023-06-23 DIAGNOSIS — K769 Liver disease, unspecified: Secondary | ICD-10-CM

## 2023-06-23 DIAGNOSIS — R1084 Generalized abdominal pain: Secondary | ICD-10-CM

## 2023-06-23 NOTE — Telephone Encounter (Signed)
 Patient called stating he is claustrophobic and was sent to [facility] for CT scan appointment but is not an open MRI. Patient states he called [facility] and they do have an open MRI. Patient is requesting to do CT scan over there but would need paperwork sent over to [phone].

## 2023-06-26 NOTE — Telephone Encounter (Signed)
 Order faxed to [phone] as requested. [employer] Medicare does not require pre-certification per referral coordinator.

## 2023-06-27 ENCOUNTER — Telehealth: Payer: Self-pay

## 2023-06-27 NOTE — Telephone Encounter (Signed)
Received a VM from [facility] at [facility].  Patient is going to have a Colonoscopy on Thursday and he is going to be on clear liquids the day before the procedure and since he is on U-500 they would like for you to advise what he needs to do with his insulin.

## 2023-06-27 NOTE — Telephone Encounter (Signed)
Pt has been notified and voices understanding.   Dicie Beam

## 2023-06-27 NOTE — Telephone Encounter (Signed)
J, Per my records, he is on the following regimen: - U500 insulin - 30 min before meals 150 units in the morning  130 units in the evening - Ozempic 1 mg weekly - Lantus 75 units daily at bedtime  Since he is not eating tomorrow, he can decrease the doses of Lantus to 40 units.  Regarding U-500 insulin, he could try to take 75 units in a.m. but skip the dose in the evening.  Check blood sugars consistently and he may need to take some sweet collarless liquids if the sugars are trending down.  Thank you! C

## 2023-06-28 ENCOUNTER — Telehealth: Payer: Self-pay | Admitting: Internal Medicine

## 2023-06-28 ENCOUNTER — Ambulatory Visit: Payer: Self-pay | Admitting: Nurse Practitioner

## 2023-06-28 NOTE — Telephone Encounter (Signed)
Inbound call from patient, states he caught the Flu and will need to reschedule his procedure for tomorrow at [facility].

## 2023-06-28 NOTE — Telephone Encounter (Signed)
Pt scheduled for acute visit tomorrow

## 2023-06-28 NOTE — Telephone Encounter (Signed)
I have added him to the hospital list.

## 2023-06-28 NOTE — Telephone Encounter (Addendum)
Office transferred husband and wife on phone to triage nurse, "both exposed to type A flu, have all standard symptoms, want antibx called in."  Pulmonary "Chief Complaint (e.g., cough, sob, wheezing, fever, chills, sweat or additional symptoms) *Go to specific symptom protocol after initial questions. Cough, coughing up bunch of stuff, runny nose, sometimes a little yellow but mostly clear, but from lungs kind of a greenish-gray looking, not coughing up any blood, little more wheezing than normal, SOB when go from heat to cold, last night felt right cold and put an extra sheet on me, no fever, some headaches I can't explain, no chest pain, takes oxycodone with pain and it helps but don't make it go away, pain has been bad from shoulders to my toes "How long have symptoms been present?" Not quite a week Have you tested for COVID or Flu? Note: If not, ask patient if a home test can be taken. If so, instruct patient to call back for positive results. No don't believe in those tests, give false positives/negatives MEDICINES:   "Have you used any OTC meds to help with symptoms?" yes If yes, ask "What medications?" mucinex "Have you used your inhalers/maintenance medication?" Yes, no maintenance meds If yes, "What medications?" albuterol If inhaler, ask "How many puffs and how often?" Note: Review instructions on medication in the chart. Albuterol inhaler helps, 1 puff when SOB, depends on how often go into the cold, maybe every 2-3 hours go outside, but if take a puff and go out 30 min later then pretty good OXYGEN: "Do you wear supplemental oxygen?" No "Do you monitor your oxygen levels?" Yes If yes, "What is your reading (oxygen level) today?" 98% at doc the other day but doesn't sound normal for me, high for me, 97% just now "What is your usual oxygen saturation reading?"  (Note: Pulmonary O2 sats should be 90% or greater) Normally at 93%            Chief Complaint: productive cough, more  SOB Symptoms: cough productive to greenish-gray sputum, more wheezing than normal, more SOB than normal with environmental changes, occasional chills, body aches, headaches Frequency: intermittent, continual Pertinent Negatives: Patient denies chest pain, fever, struggling to breathe Disposition: [] 911 / [] ED /[] Urgent Care (no appt availability in office) / [x] Appointment(In office/virtual)/ []  [facility]/ [] Home Care/ [] Refused Recommended Disposition /[] [facility]/ []  Follow-up with PCP Additional Notes: Pt reporting flu symptoms for almost a week, recent exposure to flu A, "coughing up bunch of stuff, runny nose, sometimes a little yellow but mostly clear from nose, but from lungs kind of a greenish-gray looking," confirms not coughing up any blood, but reporting "little more wheezing than normal, SOB when go from heat to cold, last night felt right cold and put an extra sheet on me," but confirms no fever, "some headaches I can't explain," confirms no chest pain. Pt reporting he takes oxycodone with pain and "it helps but don't make it go away, pain has been bad from shoulders to my toes." Advised pt be examined today, pt reporting transportation issues with car, "got an electric chair, no way to haul it on the vehicle yet" and states "your place is not handicap-friendly." Advised pt speak with pulm office to discuss options, even virtual visit in meantime. Warm transferred to office, speaking with Tannessa. Advised call back if worsening, go to hospital if chest pain or worsening symptoms, pt verbalized understanding.  Reason for Disposition  Wheezing is present  Answer Assessment -  Initial Assessment Questions 4. HEMOPTYSIS: "Are you coughing up any blood?" If so ask: "How much?" (flecks, streaks, tablespoons, etc.)     denies 5. DIFFICULTY BREATHING: "Are you having difficulty breathing?" If Yes, ask: "How bad is it?" (e.g., mild, moderate, severe)    - MILD: No SOB at  rest, mild SOB with walking, speaks normally in sentences, can lie down, no retractions, pulse < 100.    - MODERATE: SOB at rest, SOB with minimal exertion and prefers to sit, cannot lie down flat, speaks in phrases, mild retractions, audible wheezing, pulse 100-120.    - SEVERE: Very SOB at rest, speaks in single words, struggling to breathe, sitting hunched forward, retractions, pulse > 120      Little more wheezing than normal, little more SOB than normal intermittently, albuterol helps 6. FEVER: "Do you have a fever?" If Yes, ask: "What is your temperature, how was it measured, and when did it start?"     denies 7. CARDIAC HISTORY: "Do you have any history of heart disease?" (e.g., heart attack, congestive heart failure)      Significant cardiac hx per pt chart 8. LUNG HISTORY: "Do you have any history of lung disease?"  (e.g., pulmonary embolus, asthma, emphysema)     COPD  Protocols used: Cough - Acute Productive-A-AH

## 2023-06-29 ENCOUNTER — Ambulatory Visit (INDEPENDENT_AMBULATORY_CARE_PROVIDER_SITE_OTHER): Payer: Medicare Other | Admitting: Adult Health

## 2023-06-29 ENCOUNTER — Encounter: Payer: Self-pay | Admitting: Adult Health

## 2023-06-29 ENCOUNTER — Ambulatory Visit (HOSPITAL_COMMUNITY): Admission: RE | Admit: 2023-06-29 | Payer: Medicare Other | Source: Home / Self Care | Admitting: Internal Medicine

## 2023-06-29 ENCOUNTER — Ambulatory Visit: Payer: Medicare Other

## 2023-06-29 VITALS — BP 103/55 | HR 69 | Temp 98.0°F | Ht 74.0 in | Wt 259.6 lb

## 2023-06-29 DIAGNOSIS — J449 Chronic obstructive pulmonary disease, unspecified: Secondary | ICD-10-CM

## 2023-06-29 DIAGNOSIS — Z720 Tobacco use: Secondary | ICD-10-CM | POA: Diagnosis not present

## 2023-06-29 SURGERY — COLONOSCOPY WITH PROPOFOL
Anesthesia: Monitor Anesthesia Care

## 2023-06-29 MED ORDER — PREDNISONE 20 MG PO TABS
20.0000 mg | ORAL_TABLET | Freq: Every day | ORAL | 0 refills | Status: DC
Start: 2023-06-29 — End: 2023-07-17

## 2023-06-29 MED ORDER — ALBUTEROL SULFATE (2.5 MG/3ML) 0.083% IN NEBU: 2.5000 mg | INHALATION_SOLUTION | Freq: Four times a day (QID) | RESPIRATORY_TRACT | 5 refills | Status: AC | PRN

## 2023-06-29 MED ORDER — DOXYCYCLINE HYCLATE 100 MG PO TABS
100.0000 mg | ORAL_TABLET | Freq: Two times a day (BID) | ORAL | 0 refills | Status: DC
Start: 2023-06-29 — End: 2023-07-17

## 2023-06-29 NOTE — Progress Notes (Signed)
@Patient  ID: [name], male    DOB: 1955-07-29, 68 y.o.   MRN: [id number]  Chief Complaint  Patient presents with   Follow-up    Acute,Flu exposure on Sunday prior    Referring provider: Audie Pinto, FNP  HPI: 68 yo male active smoker followed for COPD  Medical history significant for  A Fib s/p ablation in 2016 and Cirrhosis  History of OSA -CPAP intolerant    TEST/EVENTS :  Pulmonary function testing Sep 16, 2015 showed moderate COPD with an FEV1 at 65%, ratio 68, FVC 73%, positive bronchodilator response.   Echo 07/2019 - EF 60-65%, Grade 2 DD, Mild LVH , moderately elevated PAP (52.3mmHg), severe dilated LA , RA mod dilated. , TV mild to mod regurgitation ,     ENT for dysphagia.  Underwent laryngoscopy December 2021.  ENT notes showed normal vocal cords and vocal cord mobility.  Some mild edema of the mucosa indicative of reflux.  Upper esophagus was clear on fibrotic laryngoscopy.  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 -follows with ENT   06/29/2023 Acute OV : COPD  Patient presents for an acute office visit.  Complains 4 days of cough, congestion, runny nose, increased wheezing and shortness of breath.  Also has fever body aches and chills.  Did have a direct exposure to the flu with her daughter.   Wife also has similar symptoms.  Flu shot is up to date .  Patient is followed for COPD.  Remains on Trelegy inhaler daily.  Does continue smoke.  We discussed smoking cessation. Taking mucinex without much help.  Covid and Flu swab today in office are negative.  Appetite is okay. No n/v/d. No hemoptysis or chest pain .    Allergies  Allergen Reactions   Adhesive [Tape]     itching   Latex Itching    When tape is on the skin too long skin gets red & itching    Immunization History  Administered Date(s) Administered   Influenza Split 01/30/2011, 03/05/2012, 02/13/2013, 02/03/2016, 02/20/2019   Influenza Whole 02/13/2009   Influenza,inj,Quad PF,6+ Mos  03/14/2014, 04/22/2015, 01/19/2016, 02/02/2017, 02/05/2019, 05/01/2020, 04/19/2021   Influenza-Unspecified 01/30/2011, 03/05/2012, 02/13/2013, 03/14/2014, 04/22/2015, 01/19/2016, 02/03/2016, 02/02/2017, 02/13/2018   PFIZER(Purple Top)SARS-COV-2 Vaccination 08/04/2019, 09/30/2019   Pneumococcal Polysaccharide-23 02/14/2007    Past Medical History:  Diagnosis Date   Actinic keratosis    Arthritis    lower back   Asthma    Atrial fibrillation (HCC)    Atrial flutter (HCC)    s/p CTI ablation by Dr Johney Frame   Brain aneurysm 2009   questionable. A follow up CTA in 2009 showed no evidence of   Chronic back pain    DDD/stenosis   Colon polyps    9 polyps removed 10/13/11   Complication of anesthesia 09/11/2012   slow to awaken after ablation   COPD (chronic obstructive pulmonary disease) (HCC)    Diabetes mellitus    takes Metformin and Glimepiride daily   Emphysema    GERD (gastroesophageal reflux disease)    takes Omeprazole bid   Heart failure (HCC)    History of shingles    HLD (hyperlipidemia)    takes Pravastatin daily   HTN (hypertension)    takes Prinizide daily   Obesity    OSA (obstructive sleep apnea)    not always using cpap   Overdose 2009   unintentional Flecanide overdose   Peripheral neuropathy    Short-term memory loss  Tobacco abuse     Tobacco History: Social History   Tobacco Use  Smoking Status Every Day   Current packs/day: 1.00   Average packs/day: 1 pack/day for 49.0 years (49.0 ttl pk-yrs)   Types: Cigarettes, E-cigarettes  Smokeless Tobacco Never  Tobacco Comments   Smoking almost a pack per day Patient is vaping daily.  09/28/2022 am a pack of cigs in 4days 01/05/23   Ready to quit: Not Answered Counseling given: Not Answered Tobacco comments: Smoking almost a pack per day Patient is vaping daily.  09/28/2022 am a pack of cigs in 4days 01/05/23   Outpatient Medications Prior to Visit  Medication Sig Dispense Refill   albuterol (PROVENTIL)  (2.5 MG/3ML) 0.083% nebulizer solution Take 3 mLs (2.5 mg total) by nebulization every 6 (six) hours as needed for wheezing or shortness of breath. 75 mL 12   albuterol (VENTOLIN HFA) 108 (90 Base) MCG/ACT inhaler INHALE 1-2 PUFFS INTO THE LUNGS EVERY 6 HOURS AS NEEDED FOR WHEEZING OR SHORTNESS OF BREATH 8.5 g 5   b complex vitamins tablet Take 1 tablet by mouth daily.      bisoprolol (ZEBETA) 10 MG tablet Take 1 tablet (10 mg total) by mouth daily. 90 tablet 1   clobetasol cream (TEMOVATE) 0.05 % Apply 1 Application topically 2 (two) times daily. 60 g 2   clotrimazole-betamethasone (LOTRISONE) cream Apply 1 Application topically daily. 45 g 5   colchicine 0.6 MG tablet TAKE 2 TABLETS BY MOUTH AT ONCE. THEN TAKE 1 TABLET 1 HOUR LATER. WAIT 2 DAYS, THEN BEGIN TAKING 1 TABLET DAILY TO PREVENT ACUTE GOUT. 33 tablet 2   Continuous Glucose Receiver (DEXCOM G7 RECEIVER) DEVI See admin instructions.     Continuous Glucose Sensor (DEXCOM G7 SENSOR) MISC 3 each by Does not apply route every 30 (thirty) days. Apply 1 sensor every 10 days 9 each 4   dexamethasone (DECADRON) 1 MG tablet Take 1 tablet by mouth once at 11 pm, before coming for labs at 8 am the next morning 1 tablet 0   diltiazem (CARDIZEM CD) 120 MG 24 hr capsule TAKE 1 CAPSULE BY MOUTH DAILY 90 capsule 0   famotidine (PEPCID) 20 MG tablet One after supper 30 tablet 11   febuxostat (ULORIC) 40 MG tablet TAKE 1 TABLET BY MOUTH DAILY 30 tablet 3   Fluticasone-Umeclidin-Vilant (TRELEGY ELLIPTA) 100-62.5-25 MCG/ACT AEPB Inhale 1 puff into the lungs every morning. 6 each 0   Fluticasone-Umeclidin-Vilant (TRELEGY ELLIPTA) 100-62.5-25 MCG/ACT AEPB One click each am 28 each 5   gabapentin (NEURONTIN) 300 MG capsule Take 3 capsules (900 mg total) by mouth 3 (three) times daily. TAKE 3 CAPSULES BY MOUTH THREE TIMES DAILY FOR NEUROPATHY 270 capsule 11   Glucagon 3 MG/DOSE POWD Place 3 mg into the nose once as needed for up to 1 dose. 1 each 11   insulin  glargine (LANTUS SOLOSTAR) 100 UNIT/ML Solostar Pen Inject 75 Units into the skin daily. 30 mL 3   Insulin Pen Needle (EASY COMFORT PEN NEEDLES) 31G X 5 MM MISC USE 4 TIMES DAILY AS DIRECTED 300 each 3   insulin regular human CONCENTRATED (HUMULIN R U-500 KWIKPEN) 500 UNIT/ML KwikPen Inject 260 Units into the skin daily. Use up to 250-260 units of insulin daily as advised 48 mL 1   ketoconazole (NIZORAL) 2 % cream Apply 1 Application topically daily. 60 g 2   lactulose (CEPHULAC) 10 g packet Take 1 packet (10 g total) by mouth 3 (three) times daily. 30  each 0   losartan (COZAAR) 50 MG tablet TAKE 1 TABLET BY MOUTH DAILY 90 tablet 4   metolazone (ZAROXOLYN) 2.5 MG tablet TAKE ONE TABLET BY MOUTH ON TUESDAYS AND THURSDAYS 30 MINUTES BEFORE TAKING TORSEMIDE AS DIRECTED 8 tablet 6   mupirocin ointment (BACTROBAN) 2 % Ply small amount to effected area twice daily for 10 days. 22 g 1   Na Sulfate-K Sulfate-Mg Sulf 17.5-3.13-1.6 GM/177ML SOLN Use as directed; may use generic; goodrx card if insurance will not cover generic 354 mL 0   nitroGLYCERIN (NITROSTAT) 0.4 MG SL tablet Place 1 tablet (0.4 mg total) under the tongue every 5 (five) minutes x 3 doses as needed for chest pain. 25 tablet 3   omeprazole (PRILOSEC) 40 MG capsule Take 1 capsule by mouth daily.     oxyCODONE-acetaminophen (PERCOCET) 10-325 MG tablet Take 1.5 tablets by mouth in the morning, at noon, in the evening, and at bedtime.     pantoprazole (PROTONIX) 40 MG tablet Take 1 tablet (40 mg total) by mouth daily. Take 30-60 min before first meal of the day 30 tablet 11   potassium chloride SA (KLOR-CON M) 20 MEQ tablet TAKE 1 TABLET BY MOUTH DAILY. TAKE AN EXTRA TABLET ON TUESDAY AND THURSDAY 30 tablet 6   predniSONE (DELTASONE) 10 MG tablet Take 4 tablets each am x 2 days, 2 tablets each am x 2 days, 1 tablet each am x 2 days and stop 14 tablet 0   primidone (MYSOLINE) 50 MG tablet Take 0.5 tablets (25 mg total) by mouth daily. 45 tablet 3    rosuvastatin (CRESTOR) 10 MG tablet Take 1 tablet (10 mg total) by mouth daily. 90 tablet 1   Semaglutide, 1 MG/DOSE, (OZEMPIC, 1 MG/DOSE,) 4 MG/3ML SOPN Inject 1 mg into the skin once a week. 9 mL 3   tacrolimus (PROTOPIC) 0.1 % ointment Apply topically 2 (two) times daily. 100 g 2   tamsulosin (FLOMAX) 0.4 MG CAPS capsule TAKE 1 CAPSULE BY MOUTH EACH NIGHT AT BEDTIME 30 capsule 11   torsemide (DEMADEX) 20 MG tablet TAKE 2 TABELTS BY MOUTH DAILY IN THE MORNING AND 1 IN THE EVENING 270 tablet 3   triamcinolone cream (KENALOG) 0.1 % Apply 1 Application topically daily as needed. 453 g 2   VITAMIN A PO Take 2,400 mcg by mouth daily.      Vitamin D, Ergocalciferol, (DRISDOL) 1.25 MG (50000 UNIT) CAPS capsule Take 1 capsule (50,000 Units total) by mouth every 7 (seven) days. 12 capsule 3   warfarin (COUMADIN) 5 MG tablet Take 1/2 to 1 tablet daily or as directed 70 tablet 1   No facility-administered medications prior to visit.     Review of Systems:   Constitutional:   No  weight loss, night sweats,  Fevers, chills,  +fatigue, or  lassitude.  HEENT:   No headaches,  Difficulty swallowing,  Tooth/dental problems, or  Sore throat,                No sneezing, itching, ear ache, nasal congestion, post nasal drip,   CV:  No chest pain,  Orthopnea, PND, swelling in lower extremities, anasarca, dizziness, palpitations, syncope.   GI  No heartburn, indigestion, abdominal pain, nausea, vomiting, diarrhea, change in bowel habits, loss of appetite, bloody stools.   Resp: .  No chest wall deformity  Skin: no rash or lesions.  GU: no dysuria, change in color of urine, no urgency or frequency.  No flank pain, no hematuria  MS:  No joint pain or swelling.  No decreased range of motion.  No back pain.    Physical Exam  BP (!) 103/55 (BP Location: Left Arm, Patient Position: Sitting, Cuff Size: Large)   Pulse 69   Temp 98 F (36.7 C) (Oral)   Ht 6\' 2"  (1.88 m)   Wt 259 lb 9.6 oz (117.8 kg)    SpO2 95%   BMI 33.33 kg/m   GEN: A/Ox3; pleasant , NAD, well nourished    HEENT:  [place]/AT,  EACs-clear, TMs-wnl, NOSE-clear, THROAT-clear, no lesions, no postnasal drip or exudate noted.   NECK:  Supple w/ fair ROM; no JVD; normal carotid impulses w/o bruits; no thyromegaly or nodules palpated; no lymphadenopathy.    RESP  few scattered rhonchi . no accessory muscle use, no dullness to percussion  CARD:  RRR, no m/r/g, no peripheral edema, pulses intact, no cyanosis or clubbing.  GI:   Soft & nt; nml bowel sounds; no organomegaly or masses detected.   Musco: Warm bil, no deformities or joint swelling noted.   Neuro: alert, no focal deficits noted.    Skin: Warm, no lesions or rashes    Lab Results:  CBC   BMET   BNP   ProBNP    Administration History     None          Latest Ref Rng & Units 09/16/2015    9:46 AM  PFT Results  FVC-Pre L 3.57  P  FVC-Predicted Pre % 64  P  FVC-Post L 4.09  P  FVC-Predicted Post % 73  P  Pre FEV1/FVC % % 67  P  Post FEV1/FCV % % 68  P  FEV1-Pre L 2.39  P  FEV1-Predicted Pre % 56  P  FEV1-Post L 2.79  P    P Preliminary result    No results found for: "NITRICOXIDE"      Assessment & Plan:   No problem-specific Assessment & Plan notes found for this encounter.     Rubye Oaks, NP 06/29/2023

## 2023-06-29 NOTE — Patient Instructions (Addendum)
Chest xray today  Doxycycline 100mg  Twice daily , take with food.  Prednisone 20mg  daily for 5 days  Mucinex DM Twice daily  As needed  cough/congestion  Tessalon Three times a day  As needed  cough  Fluids and rest  Continue on Trelegy 1 puff daily  Albuterol inhaler or neb As needed   Follow up with Dr. Sherene Sires  in 6 weeks and As needed   Please contact office for sooner follow up if symptoms do not improve or worsen or seek emergency care

## 2023-07-02 NOTE — Assessment & Plan Note (Signed)
 Smoking cessation discussed

## 2023-07-02 NOTE — Assessment & Plan Note (Signed)
Acute exacerbation . Flu/Covid negative in office today  Treat with empiric antibiotics and steroids   Plan  Patient Instructions  Chest xray today  Doxycycline 100mg  Twice daily , take with food.  Prednisone 20mg  daily for 5 days  Mucinex DM Twice daily  As needed  cough/congestion  Tessalon Three times a day  As needed  cough  Fluids and rest  Continue on Trelegy 1 puff daily  Albuterol inhaler or neb As needed   Follow up with Dr. Sherene Sires  in 6 weeks and As needed   Please contact office for sooner follow up if symptoms do not improve or worsen or seek emergency care

## 2023-07-05 NOTE — Progress Notes (Signed)
 I saw [name] in neurology clinic on 07/13/23 in follow up for neuropathy.  HPI: [name] is a 68 y.o. 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  To briefly review: Initial consultation 03/31/22: Patient started noticing symptoms 2-2.5 years ago. He relates that his symptoms started having muscle loss, weakness, and tremors. He states prior to this, he was strong. He still tries to stay active, but his muscle loss has slowed him down. He has tingling and numbness in bilateral legs and hands.   He uses a cane to ambulate mostly, but sometimes also uses a walker. He falls often. He mentions that when he is just standing, he tends to get off balance. He falls about once per month. If he is walking, he does not have problems. If he tries to look up or down or turns, that is most likely time to fall. He does occasionally freeze when walking.   Patient mentions he has had a tremor in his left hand since 1998 after his cervical spine fusion. He mentions now that he has tremors all over. His body will jump and almost throw him out of a chair. He mentions that when he is more nervous his tremors worsen.   He relates a long history of neck and back problems. He mentions lower back pain since the age of 64. He also states his cervical spine is "worn out."    The patient denies symptoms suggestive of oculobulbar weakness including diplopia, ptosis, poor saliva control, dysarthria/dysphonia, impaired mastication, facial weakness/droop.    He does sometimes have difficulty getting food down and things will get stuck. Sometimes he has to swallow liquids twice. Per patient, he has something growing in his neck. I do not see notes about this.   Patient has COPD and chronic shortness of breath and has had  difficulty laying on his back due to shortness of breath for about 25 years.   Pseudobulbar affect is absent.   Patient has lost 15-20 pounds over the last 2 years without trying. He has put some weight back on lately.   Patient takes gabapentin 900 mg BID for neuropathy. The pain in his legs can be very severe, especially at night.   Of note, patient is on warfarin for afib.   Patient was on pravastatin but stopped recently due to weakness. He has been off of it for 1-2 weeks. He thinks it may have helped, but is not sure.   Patient was sent to PT. He went once in the summer of 2023. He stopped due to pain.   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 Patient thinks his tremor would go away years ago after drinking. Restrictive diet? No Family history of neuropathy/myopathy/NM disease? Son has tremors   07/06/22: Blood work was significant for low B1. I recommended supplementation with 100 mg daily on 04/11/22. He takes "B vitamin" but is not sure if he is taking B1.   He continues to have a lot a pain. He has pain in neck, back, and legs. The majority of his pain is in his back. He has seen spine surgery in the past and was told there was nothing they could do as his heart doctor would not clear him. I recommended  MRI cervical spine at last visit, but patient has not gotten it. He said he tried to call but no one responded to his calls about this.   He feels weaker and like he may be headed toward a wheelchair. He just got a new shower that will have a seat and bars. He is getting this installed soon.   His tremor is about the same as prior. He was unable to use the primidone as it would make him sleep too much.   He still has difficulty occasionally with swallowing. He has seen ENT in the past and was told he had a "worm like" thing in throat that hangs up his food. He does not have much difficulty with liquids. He has not had a swallow  evaluation.  01/05/23: Patient has significant back pain and did not think he could tolerate the MRIs, so he never got them done. He continues to have a lot of back pain. He recently was given a Press photographer, which he is happy about and plans to use.   He has had some episodes of near syncope or syncope. He had his BP medication adjusted that has really helped him with this. Patient has had frequent falls, up to 3-4 times per week. He describes back pain and his right leg giving out. He had a CT head on 10/26/22 that showed no acute process. Patient is not sure if home PT has been ordered.   Swallow evaluation on 08/03/22 was essentially unremarkable. Patient continues to complain of getting food stuck in his throat with a lot of acid reflux and belching.   Patient mentions he and his wife's health is so poor that they have difficulty doing things around the house, such as cleaning. He mentions difficulty paying bills.   Patient continues to have hand tremors. He has not taken primidone recently. He continues to be on Warfarin.  Most recent Assessment and Plan (01/05/23): This is [name], a 68 y.o. male with: Numbness, tingling, pain, and weakness - These symptoms are likely multifactorial with contributions from arthritis, cervical and lumbar spine disease, and polyneuropathy. His known risk factors for PN are DM, EtOH abuse, and B1 deficiency. Essential tremor - Patient has not taken primidone as he is not sure if he has the medication. He is already on gabapentin and propranolol would not be a good option given his other cardiac medications. Primidone could interact with Warfarin, but patient is monitored at Coumadin clinic and has been on primidone in the past. He is willing to retry primidone at a low dose to see if it helps with tremor.   Plan: -Home health care: PT, home health aide would both benefit patient -Primidone 25 mg daily for essential tremor. Patient to follow closely with  Coumadin clinic to make sure it stays therapeutic. -Continue gabapentin 900 mg TID -Fall precautions discussed -Discussed foot care and checking his feet daily -Continue B1 supplementation  Since their last visit: Patient is having diffuse pain. He is taking chronic opioids. He sees [facility] pain management for this. He continues to take gabapentin 900 mg TID as well.  He does not walk much due to pain and weakness. He falls or passes out every day. He will be walking and feel like he is going to pass out. He has an Passenger transport manager. He finally got a ramp to get into his house. There is not a lot of room in his house to get the chair around though. He has  not gotten the work done yet.  Home healthcare came out for about 6-8 weeks. He did not feel like the therapy helped much.  He stopped taking primidone because it was making him sleep too much. He feels his tremor is worse than ever. He has difficulty eating and drinking due to this. Given his other medical problems, he is not interested in another medication currently.  He is also currently being worked up for potential liver cancer per patient. He also has enlarged prostate and enlarged spleen.   MEDICATIONS:  Outpatient Encounter Medications as of 07/13/2023  Medication Sig Note   albuterol (PROVENTIL) (2.5 MG/3ML) 0.083% nebulizer solution Take 3 mLs (2.5 mg total) by nebulization every 6 (six) hours as needed for wheezing or shortness of breath.    albuterol (PROVENTIL) (2.5 MG/3ML) 0.083% nebulizer solution Take 3 mLs (2.5 mg total) by nebulization every 6 (six) hours as needed for wheezing or shortness of breath.    albuterol (VENTOLIN HFA) 108 (90 Base) MCG/ACT inhaler INHALE 1-2 PUFFS INTO THE LUNGS EVERY 6 HOURS AS NEEDED FOR WHEEZING OR SHORTNESS OF BREATH    b complex vitamins tablet Take 1 tablet by mouth daily.     bisoprolol (ZEBETA) 10 MG tablet Take 1 tablet (10 mg total) by mouth daily.    clobetasol cream  (TEMOVATE) 0.05 % Apply 1 Application topically 2 (two) times daily.    clotrimazole-betamethasone (LOTRISONE) cream Apply 1 Application topically daily.    colchicine 0.6 MG tablet TAKE 2 TABLETS BY MOUTH AT ONCE. THEN TAKE 1 TABLET 1 HOUR LATER. WAIT 2 DAYS, THEN BEGIN TAKING 1 TABLET DAILY TO PREVENT ACUTE GOUT.    Continuous Glucose Receiver (DEXCOM G7 RECEIVER) DEVI See admin instructions.    Continuous Glucose Sensor (DEXCOM G7 SENSOR) MISC 3 each by Does not apply route every 30 (thirty) days. Apply 1 sensor every 10 days    dexamethasone (DECADRON) 1 MG tablet Take 1 tablet by mouth once at 11 pm, before coming for labs at 8 am the next morning    diltiazem (CARDIZEM CD) 120 MG 24 hr capsule TAKE 1 CAPSULE BY MOUTH DAILY    doxycycline (VIBRA-TABS) 100 MG tablet Take 1 tablet (100 mg total) by mouth 2 (two) times daily.    famotidine (PEPCID) 20 MG tablet One after supper    febuxostat (ULORIC) 40 MG tablet TAKE 1 TABLET BY MOUTH DAILY    Fluticasone-Umeclidin-Vilant (TRELEGY ELLIPTA) 100-62.5-25 MCG/ACT AEPB Inhale 1 puff into the lungs every morning.    Fluticasone-Umeclidin-Vilant (TRELEGY ELLIPTA) 100-62.5-25 MCG/ACT AEPB One click each am    gabapentin (NEURONTIN) 300 MG capsule Take 3 capsules (900 mg total) by mouth 3 (three) times daily. TAKE 3 CAPSULES BY MOUTH THREE TIMES DAILY FOR NEUROPATHY    Glucagon 3 MG/DOSE POWD Place 3 mg into the nose once as needed for up to 1 dose.    insulin glargine (LANTUS SOLOSTAR) 100 UNIT/ML Solostar Pen Inject 75 Units into the skin daily.    Insulin Pen Needle (EASY COMFORT PEN NEEDLES) 31G X 5 MM MISC USE 4 TIMES DAILY AS DIRECTED    insulin regular human CONCENTRATED (HUMULIN R U-500 KWIKPEN) 500 UNIT/ML KwikPen Inject 260 Units into the skin daily. Use up to 250-260 units of insulin daily as advised    ketoconazole (NIZORAL) 2 % cream Apply 1 Application topically daily.    lactulose (CEPHULAC) 10 g packet Take 1 packet (10 g total) by mouth  3 (three) times daily.  losartan (COZAAR) 50 MG tablet TAKE 1 TABLET BY MOUTH DAILY    metolazone (ZAROXOLYN) 2.5 MG tablet TAKE ONE TABLET BY MOUTH ON TUESDAYS AND THURSDAYS 30 MINUTES BEFORE TAKING TORSEMIDE AS DIRECTED    mupirocin ointment (BACTROBAN) 2 % Ply small amount to effected area twice daily for 10 days.    Na Sulfate-K Sulfate-Mg Sulf 17.5-3.13-1.6 GM/177ML SOLN Use as directed; may use generic; goodrx card if insurance will not cover generic    nitroGLYCERIN (NITROSTAT) 0.4 MG SL tablet Place 1 tablet (0.4 mg total) under the tongue every 5 (five) minutes x 3 doses as needed for chest pain. 05/19/2023: On hand   omeprazole (PRILOSEC) 40 MG capsule Take 1 capsule by mouth daily.    oxyCODONE-acetaminophen (PERCOCET) 10-325 MG tablet Take 1.5 tablets by mouth in the morning, at noon, in the evening, and at bedtime.    pantoprazole (PROTONIX) 40 MG tablet Take 1 tablet (40 mg total) by mouth daily. Take 30-60 min before first meal of the day    potassium chloride SA (KLOR-CON M) 20 MEQ tablet TAKE 1 TABLET BY MOUTH DAILY. TAKE AN EXTRA TABLET ON TUESDAY AND THURSDAY    predniSONE (DELTASONE) 20 MG tablet Take 1 tablet (20 mg total) by mouth daily with breakfast.    primidone (MYSOLINE) 50 MG tablet Take 0.5 tablets (25 mg total) by mouth daily.    rosuvastatin (CRESTOR) 10 MG tablet Take 1 tablet (10 mg total) by mouth daily.    Semaglutide, 1 MG/DOSE, (OZEMPIC, 1 MG/DOSE,) 4 MG/3ML SOPN Inject 1 mg into the skin once a week.    tacrolimus (PROTOPIC) 0.1 % ointment Apply topically 2 (two) times daily.    tamsulosin (FLOMAX) 0.4 MG CAPS capsule TAKE 1 CAPSULE BY MOUTH EACH NIGHT AT BEDTIME    torsemide (DEMADEX) 20 MG tablet TAKE 2 TABELTS BY MOUTH DAILY IN THE MORNING AND 1 IN THE EVENING    triamcinolone cream (KENALOG) 0.1 % Apply 1 Application topically daily as needed.    VITAMIN A PO Take 2,400 mcg by mouth daily.     Vitamin D, Ergocalciferol, (DRISDOL) 1.25 MG (50000 UNIT) CAPS  capsule Take 1 capsule (50,000 Units total) by mouth every 7 (seven) days.    warfarin (COUMADIN) 5 MG tablet Take 1/2 to 1 tablet daily or as directed    No facility-administered encounter medications on file as of 07/13/2023.    PAST MEDICAL HISTORY: Past Medical History:  Diagnosis Date   Actinic keratosis    Arthritis    lower back   Asthma    Atrial fibrillation [facility])    Atrial flutter (HCC)    s/p CTI ablation by Dr Johney Frame   Brain aneurysm 2009   questionable. A follow up CTA in 2009 showed no evidence of   Chronic back pain    DDD/stenosis   Colon polyps    9 polyps removed 10/13/11   Complication of anesthesia 09/11/2012   slow to awaken after ablation   COPD (chronic obstructive pulmonary disease) (HCC)    Diabetes mellitus    takes Metformin and Glimepiride daily   Emphysema    GERD (gastroesophageal reflux disease)    takes Omeprazole bid   Heart failure (HCC)    History of shingles    HLD (hyperlipidemia)    takes Pravastatin daily   HTN (hypertension)    takes Prinizide daily   Obesity    OSA (obstructive sleep apnea)    not always using cpap   Overdose 2009  unintentional Flecanide overdose   Peripheral neuropathy    Short-term memory loss    Tobacco abuse     PAST SURGICAL HISTORY: Past Surgical History:  Procedure Laterality Date   ANTERIOR CERVICAL DECOMP/DISCECTOMY FUSION  01/04/2012   Procedure: ANTERIOR CERVICAL DECOMPRESSION/DISCECTOMY FUSION 1 LEVEL/HARDWARE REMOVAL;  Surgeon: Cristi Loron, MD;  Location: [facility] NEURO ORS;  Service: Neurosurgery;  Laterality: N/A;  explore cervical fusion Cervical six - seven  with removal of codman plate anterior cervical decompression with fusion interbody prothesis plating and bonegraft   APPENDECTOMY  2-44yrs ago   ATRIAL FIBRILLATION ABLATION N/A 09/11/2012   PT DID NOT HAVE AN ATRIAL FIBRILLATION ABLATION IN 2014!  ATRIAL FLUTTER ABLATION ONLY   ATRIAL FLUTTER ABLATION  09/11/2012   CTI ablation by Dr  Johney Frame   BIOPSY  11/07/2019   Procedure: BIOPSY;  Surgeon: Rachael Fee, MD;  Location: [facility] ENDOSCOPY;  Service: Endoscopy;;   CARDIAC CATHETERIZATION  2008   no significant CAD   CARDIOVERSION  05/05/2011   Procedure: CARDIOVERSION;  Surgeon: Lewayne Bunting, MD;  Location: [facility] ENDOSCOPY;  Service: Cardiovascular;  Laterality: N/A;   CARDIOVERSION Bilateral 07/26/2012   Procedure: CARDIOVERSION;  Surgeon: Rollene Rotunda, MD;  Location: [facility] ENDOSCOPY;  Service: Cardiovascular;  Laterality: Bilateral;   CARPAL TUNNEL RELEASE  99/2000   bilateral   COLONOSCOPY WITH PROPOFOL N/A 12/13/2012   Procedure: COLONOSCOPY WITH PROPOFOL;  Surgeon: Rachael Fee, MD;  Location: [facility] ENDOSCOPY;  Service: Endoscopy;  Laterality: N/A;   ELECTROPHYSIOLOGIC STUDY N/A 04/21/2015   Procedure: Atrial Fibrillation Ablation;  Surgeon: Hillis Range, MD;  Location: [facility] INVASIVE CV LAB;  Service: Cardiovascular;  Laterality: N/A;   ESOPHAGOGASTRODUODENOSCOPY (EGD) WITH PROPOFOL N/A 11/07/2019   Procedure: ESOPHAGOGASTRODUODENOSCOPY (EGD) WITH PROPOFOL;  Surgeon: Rachael Fee, MD;  Location: [facility] ENDOSCOPY;  Service: Endoscopy;  Laterality: N/A;   HERNIA REPAIR     KNEE ARTHROSCOPY WITH MEDIAL MENISECTOMY Left 04/23/2021   Procedure: LEFT KNEE ARTHROSCOPY WITH PARTIAL MEDIAL MENISCECTOMY;  Surgeon: Eldred Manges, MD;  Location: [facility] OR;  Service: Orthopedics;  Laterality: Left;   KNEE SURGERY  6-56yrs ago   left   LEFT HEART CATH AND CORONARY ANGIOGRAPHY N/A 09/19/2016   Procedure: Left Heart Cath and Coronary Angiography;  Surgeon: Lyn Records, MD;  Location: [facility] INVASIVE CV LAB;  Service: Cardiovascular;  Laterality: N/A;   Left inguinal hernia repair     as a child   NASAL SEPTOPLASTY W/ TURBINOPLASTY Bilateral 02/19/2014   Procedure: NASAL SEPTOPLASTY WITH BILATERAL TURBINATE REDUCTION;  Surgeon: Flo Shanks, MD;  Location: [facility] OR;  Service: ENT;  Laterality: Bilateral;   NECK SURGERY  72yrs ago   right shoulder  surgery  4-61yrs ago   cyst removed   TEE WITHOUT CARDIOVERSION  05/05/2011   Procedure: TRANSESOPHAGEAL ECHOCARDIOGRAM (TEE);  Surgeon: Lewayne Bunting, MD;  Location: [facility] ENDOSCOPY;  Service: Cardiovascular;  Laterality: N/A;   TEE WITHOUT CARDIOVERSION N/A 04/21/2015   Procedure: TRANSESOPHAGEAL ECHOCARDIOGRAM (TEE);  Surgeon: Quintella Reichert, MD;  Location: [facility] ENDOSCOPY;  Service: Cardiovascular;  Laterality: N/A;   THROAT SURGERY  4-34yrs ago   "thought " it was cancer but came back not   TONSILLECTOMY      ALLERGIES: Allergies  Allergen Reactions   Adhesive [Tape]     itching   Latex Itching    When tape is on the skin too long skin gets red & itching    FAMILY HISTORY: Family History  Problem Relation Age of  Onset   Diabetes Mother    Heart disease Father    Lung cancer Father    Diabetes Maternal Grandmother    Colon cancer Neg Hx    Anesthesia problems Neg Hx    Hypotension Neg Hx    Malignant hyperthermia Neg Hx    Pseudochol deficiency Neg Hx    Esophageal cancer Neg Hx    Pancreatic cancer Neg Hx    Stomach cancer Neg Hx     SOCIAL HISTORY: Social History   Tobacco Use   Smoking status: Every Day    Current packs/day: 1.00    Average packs/day: 1 pack/day for 49.0 years (49.0 ttl pk-yrs)    Types: Cigarettes, E-cigarettes   Smokeless tobacco: Never   Tobacco comments:    Smoking almost a pack per day Patient is vaping daily.  09/28/2022 am a pack of cigs in 4days 01/05/23  Vaping Use   Vaping status: Every Day  Substance Use Topics   Alcohol use: Not Currently    Alcohol/week: 0.0 standard drinks of alcohol    Comment: 12/05/17 pt stated he has drinked 1/2 drink in 3 years   Drug use: No   Social History   Social History Narrative   Daily caffeine(Mountain Dew) Lives in [place] with spouse.Unemployed due to chronic back/ leg pain         Are you right handed or left handed? Left Handed    Are you currently employed ? No    What is your  current occupation? No   Do you live at home alone? no   Who lives with you? Lives with wife and 3 doggies   What type of home do you live in: 1 story or 2 story? Lives in one story home        Objective:  Vital Signs:  BP 108/64   Pulse 76   Ht 6\' 2"  (1.88 m)   Wt 261 lb (118.4 kg)   SpO2 97%   BMI 33.51 kg/m   General: General appearance: Awake and alert. No distress. Cooperative with exam.  HEENT: Atraumatic. ?Jaundice Lungs: Non-labored breathing on room air  Extremities: No edema.   Neurological: Mental Status: Alert. Speech fluent. No pseudobulbar affect Cranial Nerves: CNII: No RAPD. Visual fields intact. CNIII, IV, VI: PERRL. No nystagmus. EOMI. CN V: Facial sensation intact bilaterally to fine touch. CN VII: Facial muscles symmetric and strong. No ptosis at rest CN VIII: Hears finger rub well bilaterally. CN IX: No hypophonia. CN X: Palate elevates symmetrically. CN XI: Full strength shoulder shrug bilaterally. CN XII: Tongue protrusion full and midline. No atrophy or fasciculations. No significant dysarthria Motor: Tone is increased. Tremors in bilateral hands (LUE > RUE). Asterixis as well bilaterally. No obvious fasciculations in extremities. No obvious atrophy.  Individual muscle group testing (MRC grade out of 5):  Movement     Neck flexion 5    Neck extension 5     Right Left   Shoulder abduction 5 5   Elbow flexion 5 5   Elbow extension 5 5   Finger abduction - FDI 5- 5-   Finger abduction - ADM 5- 5-   Finger extension 5 5   Finger flexion 5 5    Hip flexion 3 3   Hip extension 5 5   Hip adduction 5 5   Hip abduction 5 5   Knee extension 5 5   Knee flexion 5 5   Dorsiflexion 5 5   Plantarflexion  5 5     Reflexes:  Right Left  Bicep Trace Trace  Tricep Trace Trace  BrRad Trace Trace  Knee 0 0  Ankle 0 0   Sensation: Pinprick: absent in bilateral lower extremities to knees and in hands Vibration: absent to bilateral  knees Coordination: Intact finger-to- nose-finger bilaterally with intention tremor. Gait: Unable to safely ambulate today.  Labs and Imaging Review: New results: 06/26/23 (external): CMP significant for glucose 239, Cr 1.94, alk phos 159 CBC w/ diff: Hb 10.7  06/06/23 (external): B12: 791 Lipid panel: tChol 106, LDL 48, TG 285 TSH wnl HbA1c: 8.8  Previously reviewed results: TSH (08/01/22) wnl   B12 (external - 12/22/22): 917 HbA1c (external - 12/22/22): 7.4 Lipid panel (external - 12/22/22): Total cholesterol 95, TG 286, LDL 37 CMP (external - 12/22/22): significant for K 3.2, glucose 155, Cr 1.54   03/31/22: IFE and SPEP with no M protein CK: 31 B1: < 6   HbA1c (06/28/22): 7.6   Normal or unremarkable: CRP, B12 (841), folate, RF, TSH ANA negative on 08/12/21, positive on 10/22/20, negative on 12/26/17 INR (03/24/22): 2.2 CBC (03/01/22): significant for anemia (Hb 11.2), thrombocytopenia (141) CMP (03/01/22): significant for elevated glucose, elevated alk phos (121), Cr 1.5 ESR (03/01/22): elevated to 67 HbA1c (11/30/21): 7.7   EMG (08/25/20, Dr. Alvester Morin): EMG & NCV Findings: Evaluation of the left fibular motor nerve showed prolonged distal onset latency (12.2 ms) and reduced amplitude (1.9 mV).  The left tibial motor nerve showed prolonged distal onset latency (6.2 ms), reduced amplitude (0.6 mV), and decreased conduction velocity (Knee-Ankle, 31 m/s).  The left saphenous sensory nerve showed prolonged distal peak latency (8.1 ms).  The left superficial fibular sensory nerve showed no response (14 cm).  The left sural sensory nerve showed no response (Calf).     Needle evaluation of the left anterior tibialis and the left Fibularis Longus muscles showed increased insertional activity, increased spontaneous activity, and diminished recruitment.  The left medial gastrocnemius muscle showed increased insertional activity, moderately increased spontaneous activity, and diminished recruitment.   All remaining muscles (as indicated in the following table) showed no evidence of electrical instability.     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  He carries a diagnosis of polyneuropathy but has not had electrodiagnostic studies.  For proven full characterization of the neuropathy referral to a neurologist for a full 3 limb study could be performed.  The needle EMG findings could be consistent with a lumbar stenosis but typically the level of denervation seen in motor unit changes would be a fairly severe stenosis and his latest imaging shows moderate narrowing.  He could be getting more pain from the spine on the left more than right but that is not confirmed or elucidated with this testing.   There is no significant electrodiagnostic evidence of any other focal nerve entrapment or lumbosacral plexopathy.    MRI lumbar spine wo contrast (03/01/20): FINDINGS: Segmentation:  Standard.   Alignment:  Straightening of lordosis.  Grade 1 L3-4 retrolisthesis.   Vertebrae: Multilevel Modic type 2 endplate degenerative changes. Multilevel Schmorl's node formation. Scattered hemangiomata versus focal fat.   Conus medullaris and cauda equina: Conus extends to the L1 level. Conus and cauda equina appear normal.   Disc levels: Multilevel desiccation disc space loss most prominent at the L3-4 and L5-S1 levels.   L1-2: Disc bulge with small  central protrusion, ligamentum flavum and bilateral facet hypertrophy. Mild spinal canal and bilateral neural foraminal narrowing.   L2-3: Disc bulge, ligamentum flavum and bilateral facet hypertrophy. Mild spinal canal, moderate right and mild left neural foraminal narrowing.   L3-4: Disc bulge abutting the ventral thecal sac with prominent subarticular components effacing the lateral recess.  There is abutment of the exiting L3 and descending L4 nerve roots. Bilateral facet hypertrophy. Moderate spinal canal and bilateral neural foraminal narrowing, unchanged.   L4-5: Disc bulge and bilateral facet hypertrophy. Small left foraminal protrusion grazing the exiting L4 nerve root. Patent spinal canal. Moderate bilateral neural foraminal narrowing.   L5-S1: Bilateral pars defects. Disc bulge with superimposed central protrusion/annular fissuring. Bilateral facet hypertrophy. Patent spinal canal. Mild bilateral neural foraminal narrowing.   Paraspinal and other soft tissues: Bilateral renal cysts.   IMPRESSION: Multilevel spondylosis, grossly unchanged.   Moderate L3-4 and mild L1-3 spinal canal narrowing.   Moderate right L2-3 and bilateral L3-5 neural foraminal narrowing.   Mild bilateral L1-2, L5-S1 neural foraminal narrowing.   MRI cervical spine (10/31/11): Findings: Scan extends from the mid clivus through T1-2. Normal  paraspinal soft tissues. Visualized intracranial contents are  normal.   The patient has a solid anterior cervical fusion from C4-C6.  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  The cervical spinal cord  is otherwise normal.   C1-2 and C2-3:  Normal.   C3-4:  Small slightly more prominent broad-based disc bulge with  prominent uncinate spurring to the right and left with bilateral  foraminal narrowing, unchanged.   C4-5 and C5-6:  Solid anterior fusion with no residual impingement.  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   C6-7:  Large new soft disc protrusion and extrusion into the left  lateral recess and left neural foramen affecting the left C7 nerve.  The extruded material measures 12 x 11 x 9 mm.  It touches the left  side of the spinal cord in the left lateral recess but does not  compress it.   C7-T1 and T1-2:  Normal.   IMPRESSION:  1.  Large soft disc extrusion and protrusion into the left neural  foramen and left lateral recess at C6-7 compressing the left C7  nerve.  2. 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  3.  Slight increased broad-based disc bulge at C3-4 without focal  neural impingement.   CT head wo contrast (10/26/22): FINDINGS: Brain: No evidence of acute infarction, hemorrhage, hydrocephalus, extra-axial collection or mass lesion/mass effect.   Vascular: No hyperdense vessel or unexpected calcification.   Skull: Normal. Negative for fracture or focal lesion.   Sinuses/Orbits: No acute finding.   Other: None.   IMPRESSION: No acute intracranial abnormality noted.   Swallow evaluation (08/03/22): Clinical Impression: Clinical Impression: Patient presents with a very mild oropharyngeal dysphagia resulting only in bolus residuals of mechanical soft solids remaining on tongue base after initial swallow. Patient does have sensation to this and was able to clear with sip of thin liquid barium. No penetration, aspiration or pharyngeal residuals observed with any of the tested barium consistencies (nectar and honey thick, thin, 13 mm barium tablet, mechanical soft solid). Appearance of bulging of posterior pharynx observed which did not appear to significantly impede bolus transit. Patient able to swallow 13mm barium tablet without any liquid. Esophageal sweep did not reveal any barium stasis and tablet transited  without difficulty. SLP recommended patient alternate bites of solids with sips of liquids and to avoid foods that are difficult to masticate (he has no teeth and no dentures) and avoid foods that are very dry. (he complains mostly of difficulty with foods such as breads) No further SLP intervention or evaluation warranted at this time.  ASSESSMENT: This is [name], a 68 y.o. male with: Numbness, tingling, pain, and weakness - These symptoms are likely multifactorial with  contributions from arthritis, cervical and lumbar spine disease, and polyneuropathy. His known risk factors for PN are DM, EtOH abuse, and B1 deficiency. His weakness is also made worse by deconditioning Essential tremor - Patient does not want take primidone long because he felt sleepy on it. He is already on gabapentin and propranolol would not be a good option given his other cardiac medications. We could consider topamax, but patient is not interested in more medications currently.  Plan: -Continue gabapentin 900 mg TID -Follow up [facility] management -Discussed importance of DM control and affect of CKD and liver disease on PN -Discussed treatments for ET, patient prefers to defer for now -Lidocaine cream PRN -B1 (thiamine) 100 mg daily -Fall precautions discussed  Return to clinic as needed  Total time spent reviewing records, interview, history/exam, documentation, and coordination of care on day of encounter:  40 min  Jacquelyne Balint, MD

## 2023-07-06 ENCOUNTER — Ambulatory Visit: Payer: Medicare Other

## 2023-07-13 ENCOUNTER — Ambulatory Visit: Payer: Medicare Other | Admitting: Neurology

## 2023-07-13 ENCOUNTER — Encounter: Payer: Self-pay | Admitting: Neurology

## 2023-07-13 VITALS — BP 108/64 | HR 76 | Ht 74.0 in | Wt 261.0 lb

## 2023-07-13 DIAGNOSIS — M542 Cervicalgia: Secondary | ICD-10-CM

## 2023-07-13 DIAGNOSIS — M48062 Spinal stenosis, lumbar region with neurogenic claudication: Secondary | ICD-10-CM

## 2023-07-13 DIAGNOSIS — M4802 Spinal stenosis, cervical region: Secondary | ICD-10-CM

## 2023-07-13 DIAGNOSIS — F1011 Alcohol abuse, in remission: Secondary | ICD-10-CM

## 2023-07-13 DIAGNOSIS — R269 Unspecified abnormalities of gait and mobility: Secondary | ICD-10-CM

## 2023-07-13 DIAGNOSIS — G250 Essential tremor: Secondary | ICD-10-CM | POA: Diagnosis not present

## 2023-07-13 DIAGNOSIS — G629 Polyneuropathy, unspecified: Secondary | ICD-10-CM

## 2023-07-13 DIAGNOSIS — R531 Weakness: Secondary | ICD-10-CM

## 2023-07-13 DIAGNOSIS — R52 Pain, unspecified: Secondary | ICD-10-CM

## 2023-07-13 DIAGNOSIS — E519 Thiamine deficiency, unspecified: Secondary | ICD-10-CM

## 2023-07-13 DIAGNOSIS — M6259 Muscle wasting and atrophy, not elsewhere classified, multiple sites: Secondary | ICD-10-CM

## 2023-07-13 DIAGNOSIS — R2689 Other abnormalities of gait and mobility: Secondary | ICD-10-CM

## 2023-07-13 NOTE — Patient Instructions (Signed)
 If you decide you want treatment for your tremor, let me know. I understand you are on a lot of medications and a lot going on and would prefer to hold off on treatment for now.  Let me know if you change your mind. I am always happy to see you and treat you if you would like.  The physicians and staff at [facility] are committed to providing excellent care. You may receive a survey requesting feedback about your experience at our office. We strive to receive "very good" responses to the survey questions. If you feel that your experience would prevent you from giving the office a "very good " response, please contact our office to try to remedy the situation. We may be reached at [phone]. Thank you for taking the time out of your busy day to complete the survey.  Jacquelyne Balint, MD [facility]  Preventing Falls at [facility] are common, often dreaded events in the lives of older people. Aside from the obvious injuries and even death that may result, fall can cause wide-ranging consequences including loss of independence, mental decline, decreased activity and mobility. Younger people are also at risk of falling, especially those with chronic illnesses and fatigue.  Ways to reduce risk for falling Examine diet and medications. Warm foods and alcohol dilate blood vessels, which can lead to dizziness when standing. Sleep aids, antidepressants and pain medications can also increase the likelihood of a fall.  Get a vision exam. Poor vision, cataracts and glaucoma increase the chances of falling.  Check foot gear. Shoes should fit snugly and have a sturdy, nonskid sole and a broad, low heel  Participate in a physician-approved exercise program to build and maintain muscle strength and improve balance and coordination. Programs that use ankle weights or stretch bands are excellent for muscle-strengthening. Water aerobics programs and low-impact Tai Chi programs have also been shown to  improve balance and coordination.  Increase vitamin D intake. Vitamin D improves muscle strength and increases the amount of calcium the body is able to absorb and deposit in bones.  How to prevent falls from common hazards Floors - Remove all loose wires, cords, and throw rugs. Minimize clutter. Make sure rugs are anchored and smooth. Keep furniture in its usual place.  Chairs -- Use chairs with straight backs, armrests and firm seats. Add firm cushions to existing pieces to add height.  Bathroom - Install grab bars and non-skid tape in the tub or shower. Use a bathtub transfer bench or a shower chair with a back support Use an elevated toilet seat and/or safety rails to assist standing from a low surface. Do not use towel racks or bathroom tissue holders to help you stand.  Lighting - Make sure halls, stairways, and entrances are well-lit. Install a night light in your bathroom or hallway. Make sure there is a light switch at the top and bottom of the staircase. Turn lights on if you get up in the middle of the night. Make sure lamps or light switches are within reach of the bed if you have to get up during the night.  Kitchen - Install non-skid rubber mats near the sink and stove. Clean spills immediately. Store frequently used utensils, pots, pans between waist and eye level. This helps prevent reaching and bending. Sit when getting things out of lower cupboards.  Living room/ Bedrooms - Place furniture with wide spaces in between, giving enough room to move around. Establish a route through the living  room that gives you something to hold onto as you walk.  Stairs - Make sure treads, rails, and rugs are secure. Install a rail on both sides of the stairs. If stairs are a threat, it might be helpful to arrange most of your activities on the lower level to reduce the number of times you must climb the stairs.  Entrances and doorways - Install metal handles on the walls adjacent to the doorknobs  of all doors to make it more secure as you travel through the doorway.  Tips for maintaining balance Keep at least one hand free at all times. Try using a backpack or fanny pack to hold things rather than carrying them in your hands. Never carry objects in both hands when walking as this interferes with keeping your balance.  Attempt to swing both arms from front to back while walking. This might require a conscious effort if Parkinson's disease has diminished your movement. It will, however, help you to maintain balance and posture, and reduce fatigue.  Consciously lift your feet off of the ground when walking. Shuffling and dragging of the feet is a common culprit in losing your balance.  When trying to navigate turns, use a "U" technique of facing forward and making a wide turn, rather than pivoting sharply.  Try to stand with your feet shoulder-length apart. When your feet are close together for any length of time, you increase your risk of losing your balance and falling.  Do one thing at a time. Don't try to walk and accomplish another task, such as reading or looking around. The decrease in your automatic reflexes complicates motor function, so the less distraction, the better.  Do not wear rubber or gripping soled shoes, they might "catch" on the floor and cause tripping.  Move slowly when changing positions. Use deliberate, concentrated movements and, if needed, use a grab bar or walking aid. Count 15 seconds between each movement. For example, when rising from a seated position, wait 15 seconds after standing to begin walking.  If balance is a continuous problem, you might want to consider a walking aid such as a cane, walking stick, or walker. Once you've mastered walking with help, you might be ready to try it on your own again.

## 2023-07-14 ENCOUNTER — Other Ambulatory Visit: Payer: Self-pay | Admitting: Internal Medicine

## 2023-07-14 ENCOUNTER — Telehealth: Payer: Self-pay | Admitting: Internal Medicine

## 2023-07-14 DIAGNOSIS — J449 Chronic obstructive pulmonary disease, unspecified: Secondary | ICD-10-CM

## 2023-07-14 NOTE — Telephone Encounter (Signed)
 Inbound call from patient requesting a call to discuss rescheduling 2/13 procedures at [facility]. Please advise, thank you.

## 2023-07-17 ENCOUNTER — Other Ambulatory Visit: Payer: Self-pay

## 2023-07-17 NOTE — Telephone Encounter (Signed)
 Spoke with the patient. He accepts the appointment at [facility] on 10/05/23 to arrive 6:00 am. New instructions will be mailed. He states his MRI should be coming up soon as well.

## 2023-07-18 ENCOUNTER — Ambulatory Visit: Payer: Medicare Other | Attending: Cardiovascular Disease

## 2023-07-18 ENCOUNTER — Other Ambulatory Visit: Payer: Self-pay

## 2023-07-18 DIAGNOSIS — K625 Hemorrhage of anus and rectum: Secondary | ICD-10-CM

## 2023-07-18 DIAGNOSIS — Z7901 Long term (current) use of anticoagulants: Secondary | ICD-10-CM | POA: Diagnosis not present

## 2023-07-18 DIAGNOSIS — I480 Paroxysmal atrial fibrillation: Secondary | ICD-10-CM | POA: Diagnosis not present

## 2023-07-18 DIAGNOSIS — K7031 Alcoholic cirrhosis of liver with ascites: Secondary | ICD-10-CM

## 2023-07-18 LAB — POCT INR: INR: 3.9 — AB (ref 2.0–3.0)

## 2023-07-18 NOTE — Patient Instructions (Signed)
 Hold today and tomorrow then Continue taking warfarin 1/2 tablet daily except for 1 tablet every Fridays. Recheck INR in 3 weeks. Anticoagulation Clinic [phone]

## 2023-07-21 ENCOUNTER — Other Ambulatory Visit: Payer: Self-pay | Admitting: Internal Medicine

## 2023-07-21 ENCOUNTER — Telehealth: Payer: Self-pay

## 2023-07-21 DIAGNOSIS — N1831 Chronic kidney disease, stage 3a: Secondary | ICD-10-CM

## 2023-07-21 NOTE — Telephone Encounter (Signed)
 J, For now, please advise him to continue to increase the U-500 insulin, maybe up to 180-200 units per dose but let's schedule him to come to see Bonita Quin or Vernona Rieger to make sure he is doing the injections correctly.  Also, they can review his dietary habits to see if anything needs to be adjusted.  I am afraid that something else is going on, may be an infection or inflammation in his body which is causing these numbers.   I will put the referral in. Ty! C

## 2023-07-21 NOTE — Telephone Encounter (Signed)
 Called patient to arrange an appointment with Bonita Quin or myself.  There are no availabilities that work for patient's schedule. We discussed his insulin injection routine.  He states that he is taking 160 units of U-500 insulin in his legs 2-3 times daily 30-60 minutes prior to his meals. He states that he can go 5-10 days without insulin and that his blood glucose remains the same as with insulin. Forgets insulin at times.  Reviewed insulin injection technique. Instructed to rotate his insulin injection sites at every injection.  Discussed that he should try injecting some in his stomach or in the fat above the hip. Discussed that after he injects the full dose of insulin, he should leave the needle in for about 10 seconds before removing. He discussed that it is difficult to give an injection due to shaky hands. He does not like giving the insulin in his stomach as this burns.  Discussed that he can try the area of fat above the hip.  Reviewed that he should continue to take the insulin 30 minutes before a meal. Advised him that he can increase the dose to 180-200 bid per MD.  Discussed insulin resistance. He is in a wheel chair and states that he has lost all muscle mass. He is not able to exercise. He eats a high fat diet.  Pork chop with gravy and bread this am. Encouraged increased vegetable intake. Encouraged patient to drink only water as he drinks 16 oz of soda daily.  Patient declined an appointment with [place] at another time.  Patient to call for further concerns.  Oran Rein, RD, LDN, CDCES, DipACLM

## 2023-07-21 NOTE — Telephone Encounter (Signed)
 Pt called and left a Vm stating that his blood sugar has been out of control. He states that his highest reading was 647. Not other numbers mentioned or how much insulin he is taking. He just thinks his insulin is not working. He is on a CGM but he is using the reader.   Left a voicemail to return my call.   Dicie Beam

## 2023-07-21 NOTE — Telephone Encounter (Signed)
 I was able to reach the patient, and he states he is taking 75 units of Lantus nightly and 160-180 units in the am and pm on the Humulin R U-500. I asked I'm if he has been on any steroids or been sick for the past few days and he voiced to me not that he is aware of. He voiced to me that he is getting very aggravated with his blood sugar being so HIGH and that this insulin is not working. Please Advise,

## 2023-07-21 NOTE — Telephone Encounter (Signed)
 I called and spoke with the patient, he has been advised of this information. He states he will be by this way on Monday since his wife has an appt over here. Ok to see you or Bonita Quin on Monday?

## 2023-07-24 ENCOUNTER — Other Ambulatory Visit: Payer: Self-pay | Admitting: Internal Medicine

## 2023-07-24 DIAGNOSIS — Z794 Long term (current) use of insulin: Secondary | ICD-10-CM

## 2023-07-24 DIAGNOSIS — E1122 Type 2 diabetes mellitus with diabetic chronic kidney disease: Secondary | ICD-10-CM

## 2023-07-31 ENCOUNTER — Ambulatory Visit: Payer: Self-pay | Admitting: Nurse Practitioner

## 2023-07-31 ENCOUNTER — Ambulatory Visit: Admitting: Primary Care

## 2023-07-31 NOTE — Telephone Encounter (Signed)
 Chief Complaint: cough Symptoms: productive cough, SOB, wheezing Frequency: x1 week Pertinent Negatives: Patient denies fever, CP Disposition: [] ED /[] Urgent Care (no appt availability in office) / [x] Appointment(In office/virtual)/ []  [place] [facility]/ [] Home Care/ [] Refused Recommended Disposition /[] [place] [facility]/ [x]  Follow-up with PCP Additional Notes: Pt c/o productive cough x 1 week with mild SOB and wheezing. Denies CP, fevers. Reports taking respiratory inhaler as prescribed and albuterol PRN. Triager reinforced albuterol usage. Of note, pt endorses taking leftover abx/steroids x 1 day with relief. Pt reports wife has pulm appt today with K. Cobb and wanted to try and coordinate an acute appt with wife since pt has limited transportation. Offered acute appt with alternate provider at earlier time, but pt unable to make it in time. Triager will forward encounter for K. Cobb to review and advise. Patient verbalized understanding and is expecting call back from office for next steps. Triager also advised that if pt does not hear back from office, to go to [facility] for further evaluation/treatment.    Reason for Disposition  Wheezing is present  Answer Assessment - Initial Assessment Questions E2C2 Pulmonary Triage - Initial Assessment Questions "Chief Complaint (e.g., cough, sob, wheezing, fever, chills, sweat or additional symptoms) *Go to specific symptom protocol after initial questions. Productive cough - reports took leftover abx from PCP and took 1 pill last night (cephalexin 500 mg with prednisone 10 mg)  "How long have symptoms been present?" X 1 week  Have you tested for COVID or Flu? Note: If not, ask patient if a home test can be taken. If so, instruct patient to call back for positive results. No, reports being tested in hospital (r/t stomach issue) a month ago that was negative   MEDICINES:   "Have you used any OTC meds to help with symptoms?" Yes If yes, ask  "What medications?" "Mucus relief" with no relief  "Have you used your inhalers/maintenance medication?" Yes If yes, "What medications?" Trelegy - 1 puff once a day Albuterol inh PRN - last taken this AM  If inhaler, ask "How many puffs and how often?" Note: Review instructions on medication in the chart. See above  OXYGEN: "Do you wear supplemental oxygen?" No If yes, "How many liters are you supposed to use?" N/a  "Do you monitor your oxygen levels?" Yes If yes, "What is your reading (oxygen level) today?" 97%  "What is your usual oxygen saturation reading?"  (Note: Pulmonary O2 sats should be 90% or greater) 96%   1. ONSET: "When did the cough begin?"      1 week 2. SEVERITY: "How bad is the cough today?"      bad 3. SPUTUM: "Describe the color of your sputum" (none, dry cough; clear, white, yellow, green)     Brownish-gray, greenish-gray 4. HEMOPTYSIS: "Are you coughing up any blood?" If so ask: "How much?" (flecks, streaks, tablespoons, etc.)     No, only when I blow my nose first thing in the morning 5. DIFFICULTY BREATHING: "Are you having difficulty breathing?" If Yes, ask: "How bad is it?" (e.g., mild, moderate, severe)    - MILD: No SOB at rest, mild SOB with walking, speaks normally in sentences, can lie down, no retractions, pulse < 100.    - MODERATE: SOB at rest, SOB with minimal exertion and prefers to sit, cannot lie down flat, speaks in phrases, mild retractions, audible wheezing, pulse 100-120.    - SEVERE: Very SOB at rest, speaks in single words, struggling to breathe, sitting  hunched forward, retractions, pulse > 120      Moderate, more than usual Reports abx/steroids taken last night helped SOB 6. FEVER: "Do you have a fever?" If Yes, ask: "What is your temperature, how was it measured, and when did it start?"     no 7. CARDIAC HISTORY: "Do you have any history of heart disease?" (e.g., heart attack, congestive heart failure)      CHF per chart 8. LUNG  HISTORY: "Do you have any history of lung disease?"  (e.g., pulmonary embolus, asthma, emphysema)     COPD 9. PE RISK FACTORS: "Do you have a history of blood clots?" (or: recent major surgery, recent prolonged travel, bedridden)     no 10. OTHER SYMPTOMS: "Do you have any other symptoms?" (e.g., runny nose, wheezing, chest pain)       Some wheezing "not much" Ongoing intermittent chest pain per pt - not currently, last noticed was 4-6 days ago  Protocols used: Cough - Acute Productive-A-AH

## 2023-07-31 NOTE — Telephone Encounter (Signed)
 Can you route to his primary pulmonologist, Dr. Sherene Sires?

## 2023-08-01 MED ORDER — PREDNISONE 10 MG PO TABS: ORAL_TABLET | ORAL | 0 refills | Status: DC

## 2023-08-01 NOTE — Telephone Encounter (Signed)
Prednisone 10 mg take  4 each am x 2 days,   2 each am x 2 days,  1 each am x 2 days and stop  

## 2023-08-01 NOTE — Addendum Note (Signed)
 Addended by: Sandrea Hughs B on: 08/01/2023 05:15 PM   Modules accepted: Orders

## 2023-08-02 ENCOUNTER — Other Ambulatory Visit: Payer: Self-pay | Admitting: Cardiology

## 2023-08-02 NOTE — Telephone Encounter (Signed)
 Spoke with patient regarding prior message . Advised patient Dr.Wert had sent in Prednisone 10 mg take  4 each am x 2 days,   2 each am x 2 days,  1 each am x 2 days and stop  Patient voice was understanding. Nothing else further needed.

## 2023-08-08 ENCOUNTER — Ambulatory Visit

## 2023-08-09 ENCOUNTER — Ambulatory Visit

## 2023-08-10 ENCOUNTER — Ambulatory Visit: Payer: Medicare Other | Admitting: Nurse Practitioner

## 2023-08-14 ENCOUNTER — Ambulatory Visit

## 2023-08-18 ENCOUNTER — Other Ambulatory Visit: Payer: Self-pay

## 2023-08-18 ENCOUNTER — Inpatient Hospital Stay (HOSPITAL_COMMUNITY)
Admission: EM | Admit: 2023-08-18 | Discharge: 2023-08-21 | DRG: 091 | Disposition: A | Discharge status: Home Health | Attending: Internal Medicine | Admitting: Internal Medicine

## 2023-08-18 ENCOUNTER — Encounter (HOSPITAL_COMMUNITY): Payer: Self-pay

## 2023-08-18 ENCOUNTER — Emergency Department (HOSPITAL_COMMUNITY)

## 2023-08-18 DIAGNOSIS — E785 Hyperlipidemia, unspecified: Secondary | ICD-10-CM | POA: Diagnosis present

## 2023-08-18 DIAGNOSIS — Z794 Long term (current) use of insulin: Secondary | ICD-10-CM

## 2023-08-18 DIAGNOSIS — G8929 Other chronic pain: Secondary | ICD-10-CM | POA: Diagnosis present

## 2023-08-18 DIAGNOSIS — N1831 Chronic kidney disease, stage 3a: Secondary | ICD-10-CM | POA: Diagnosis present

## 2023-08-18 DIAGNOSIS — Z8619 Personal history of other infectious and parasitic diseases: Secondary | ICD-10-CM

## 2023-08-18 DIAGNOSIS — Z8719 Personal history of other diseases of the digestive system: Secondary | ICD-10-CM

## 2023-08-18 DIAGNOSIS — K7030 Alcoholic cirrhosis of liver without ascites: Secondary | ICD-10-CM | POA: Diagnosis present

## 2023-08-18 DIAGNOSIS — J439 Emphysema, unspecified: Secondary | ICD-10-CM | POA: Diagnosis present

## 2023-08-18 DIAGNOSIS — F17210 Nicotine dependence, cigarettes, uncomplicated: Secondary | ICD-10-CM | POA: Diagnosis present

## 2023-08-18 DIAGNOSIS — E861 Hypovolemia: Secondary | ICD-10-CM | POA: Diagnosis present

## 2023-08-18 DIAGNOSIS — N179 Acute kidney failure, unspecified: Secondary | ICD-10-CM | POA: Diagnosis present

## 2023-08-18 DIAGNOSIS — Z7901 Long term (current) use of anticoagulants: Secondary | ICD-10-CM | POA: Diagnosis not present

## 2023-08-18 DIAGNOSIS — I130 Hypertensive heart and chronic kidney disease with heart failure and stage 1 through stage 4 chronic kidney disease, or unspecified chronic kidney disease: Secondary | ICD-10-CM | POA: Diagnosis present

## 2023-08-18 DIAGNOSIS — E119 Type 2 diabetes mellitus without complications: Secondary | ICD-10-CM

## 2023-08-18 DIAGNOSIS — G9341 Metabolic encephalopathy: Secondary | ICD-10-CM | POA: Diagnosis not present

## 2023-08-18 DIAGNOSIS — E1165 Type 2 diabetes mellitus with hyperglycemia: Secondary | ICD-10-CM | POA: Diagnosis present

## 2023-08-18 DIAGNOSIS — R410 Disorientation, unspecified: Principal | ICD-10-CM

## 2023-08-18 DIAGNOSIS — D61818 Other pancytopenia: Secondary | ICD-10-CM | POA: Diagnosis present

## 2023-08-18 DIAGNOSIS — E669 Obesity, unspecified: Secondary | ICD-10-CM | POA: Diagnosis present

## 2023-08-18 DIAGNOSIS — Z91199 Patient's noncompliance with other medical treatment and regimen due to unspecified reason: Secondary | ICD-10-CM

## 2023-08-18 DIAGNOSIS — E860 Dehydration: Secondary | ICD-10-CM | POA: Diagnosis present

## 2023-08-18 DIAGNOSIS — E875 Hyperkalemia: Secondary | ICD-10-CM | POA: Diagnosis present

## 2023-08-18 DIAGNOSIS — M10029 Idiopathic gout, unspecified elbow: Secondary | ICD-10-CM

## 2023-08-18 DIAGNOSIS — N189 Chronic kidney disease, unspecified: Secondary | ICD-10-CM | POA: Diagnosis not present

## 2023-08-18 DIAGNOSIS — I482 Chronic atrial fibrillation, unspecified: Secondary | ICD-10-CM | POA: Diagnosis present

## 2023-08-18 DIAGNOSIS — D689 Coagulation defect, unspecified: Secondary | ICD-10-CM | POA: Diagnosis present

## 2023-08-18 DIAGNOSIS — D649 Anemia, unspecified: Secondary | ICD-10-CM

## 2023-08-18 DIAGNOSIS — E1122 Type 2 diabetes mellitus with diabetic chronic kidney disease: Secondary | ICD-10-CM | POA: Diagnosis present

## 2023-08-18 DIAGNOSIS — G9340 Encephalopathy, unspecified: Secondary | ICD-10-CM | POA: Diagnosis present

## 2023-08-18 DIAGNOSIS — Z91048 Other nonmedicinal substance allergy status: Secondary | ICD-10-CM

## 2023-08-18 DIAGNOSIS — I5031 Acute diastolic (congestive) heart failure: Secondary | ICD-10-CM | POA: Diagnosis not present

## 2023-08-18 DIAGNOSIS — I472 Ventricular tachycardia, unspecified: Secondary | ICD-10-CM | POA: Diagnosis present

## 2023-08-18 DIAGNOSIS — Z860100 Personal history of colon polyps, unspecified: Secondary | ICD-10-CM

## 2023-08-18 DIAGNOSIS — J441 Chronic obstructive pulmonary disease with (acute) exacerbation: Secondary | ICD-10-CM | POA: Diagnosis present

## 2023-08-18 DIAGNOSIS — G928 Other toxic encephalopathy: Principal | ICD-10-CM | POA: Diagnosis present

## 2023-08-18 DIAGNOSIS — Z91040 Latex allergy status: Secondary | ICD-10-CM

## 2023-08-18 DIAGNOSIS — T426X5A Adverse effect of other antiepileptic and sedative-hypnotic drugs, initial encounter: Secondary | ICD-10-CM | POA: Diagnosis present

## 2023-08-18 DIAGNOSIS — L2089 Other atopic dermatitis: Secondary | ICD-10-CM

## 2023-08-18 DIAGNOSIS — I350 Nonrheumatic aortic (valve) stenosis: Secondary | ICD-10-CM | POA: Diagnosis present

## 2023-08-18 DIAGNOSIS — Z6833 Body mass index (BMI) 33.0-33.9, adult: Secondary | ICD-10-CM

## 2023-08-18 DIAGNOSIS — I5033 Acute on chronic diastolic (congestive) heart failure: Secondary | ICD-10-CM | POA: Diagnosis present

## 2023-08-18 DIAGNOSIS — E7220 Disorder of urea cycle metabolism, unspecified: Secondary | ICD-10-CM

## 2023-08-18 DIAGNOSIS — T402X5A Adverse effect of other opioids, initial encounter: Secondary | ICD-10-CM | POA: Diagnosis present

## 2023-08-18 DIAGNOSIS — E1142 Type 2 diabetes mellitus with diabetic polyneuropathy: Secondary | ICD-10-CM | POA: Diagnosis present

## 2023-08-18 DIAGNOSIS — M4800 Spinal stenosis, site unspecified: Secondary | ICD-10-CM | POA: Diagnosis present

## 2023-08-18 DIAGNOSIS — R4182 Altered mental status, unspecified: Secondary | ICD-10-CM

## 2023-08-18 DIAGNOSIS — Z833 Family history of diabetes mellitus: Secondary | ICD-10-CM

## 2023-08-18 DIAGNOSIS — I272 Pulmonary hypertension, unspecified: Secondary | ICD-10-CM | POA: Diagnosis present

## 2023-08-18 DIAGNOSIS — Z8249 Family history of ischemic heart disease and other diseases of the circulatory system: Secondary | ICD-10-CM

## 2023-08-18 DIAGNOSIS — Z801 Family history of malignant neoplasm of trachea, bronchus and lung: Secondary | ICD-10-CM

## 2023-08-18 DIAGNOSIS — D696 Thrombocytopenia, unspecified: Secondary | ICD-10-CM

## 2023-08-18 LAB — CBC
HCT: 29.9 % — ABNORMAL LOW (ref 39.0–52.0)
Hemoglobin: 9.3 g/dL — ABNORMAL LOW (ref 13.0–17.0)
MCH: 29.7 pg (ref 26.0–34.0)
MCHC: 31.1 g/dL (ref 30.0–36.0)
MCV: 95.5 fL (ref 80.0–100.0)
Platelets: 72 10*3/uL — ABNORMAL LOW (ref 150–400)
RBC: 3.13 MIL/uL — ABNORMAL LOW (ref 4.22–5.81)
RDW: 17.3 % — ABNORMAL HIGH (ref 11.5–15.5)
WBC: 3.7 10*3/uL — ABNORMAL LOW (ref 4.0–10.5)
nRBC: 0 % (ref 0.0–0.2)

## 2023-08-18 LAB — COMPREHENSIVE METABOLIC PANEL WITH GFR
ALT: 18 U/L (ref 0–44)
AST: 30 U/L (ref 15–41)
Albumin: 3.2 g/dL — ABNORMAL LOW (ref 3.5–5.0)
Alkaline Phosphatase: 118 U/L (ref 38–126)
Anion gap: 12 (ref 5–15)
BUN: 38 mg/dL — ABNORMAL HIGH (ref 8–23)
CO2: 29 mmol/L (ref 22–32)
Calcium: 8.9 mg/dL (ref 8.9–10.3)
Chloride: 102 mmol/L (ref 98–111)
Creatinine, Ser: 2.27 mg/dL — ABNORMAL HIGH (ref 0.61–1.24)
GFR, Estimated: 31 mL/min — ABNORMAL LOW (ref >60)
Glucose, Bld: 176 mg/dL — ABNORMAL HIGH (ref 70–99)
Potassium: 3.2 mmol/L — ABNORMAL LOW (ref 3.5–5.1)
Sodium: 143 mmol/L (ref 135–145)
Total Bilirubin: 0.8 mg/dL (ref 0.0–1.2)
Total Protein: 6.9 g/dL (ref 6.5–8.1)

## 2023-08-18 LAB — ETHANOL: Alcohol, Ethyl (B): <10 mg/dL (ref <10)

## 2023-08-18 LAB — PROTIME-INR
INR: 1.8 — ABNORMAL HIGH (ref 0.8–1.2)
Prothrombin Time: 21 s — ABNORMAL HIGH (ref 11.4–15.2)

## 2023-08-18 LAB — AMMONIA: Ammonia: 42 umol/L — ABNORMAL HIGH (ref 9–35)

## 2023-08-18 MED ORDER — LACTULOSE 10 GM/15ML PO SOLN
10.0000 g | Freq: Once | ORAL | Status: AC
  Administered 2023-08-19: 10 g via ORAL
  Filled 2023-08-18: qty 30

## 2023-08-18 MED ORDER — POTASSIUM CHLORIDE 20 MEQ PO PACK
40.0000 meq | PACK | Freq: Once | ORAL | Status: AC
  Administered 2023-08-19: 40 meq via ORAL
  Filled 2023-08-18: qty 2

## 2023-08-18 MED ORDER — ONDANSETRON HCL 4 MG/2ML IJ SOLN: 4.0000 mg | Freq: Four times a day (QID) | INTRAMUSCULAR | Status: DC | PRN

## 2023-08-18 MED ORDER — ACETAMINOPHEN 325 MG RE SUPP
487.5000 mg | Freq: Four times a day (QID) | RECTAL | Status: DC | PRN
Start: 2023-08-18 — End: 2023-08-21

## 2023-08-18 MED ORDER — LACTATED RINGERS IV BOLUS
1000.0000 mL | Freq: Once | INTRAVENOUS | Status: AC
  Administered 2023-08-18: 1000 mL via INTRAVENOUS

## 2023-08-18 MED ORDER — ACETAMINOPHEN 325 MG PO TABS: 650.0000 mg | ORAL_TABLET | Freq: Four times a day (QID) | ORAL | Status: DC | PRN

## 2023-08-18 MED ORDER — ACETAMINOPHEN 500 MG PO TABS: 500.0000 mg | ORAL_TABLET | Freq: Four times a day (QID) | ORAL | Status: DC | PRN

## 2023-08-18 MED ORDER — MELATONIN 3 MG PO TABS
3.0000 mg | ORAL_TABLET | Freq: Every evening | ORAL | Status: DC | PRN
  Filled 2023-08-18: qty 1

## 2023-08-18 MED ORDER — ACETAMINOPHEN 650 MG RE SUPP: 650.0000 mg | Freq: Four times a day (QID) | RECTAL | Status: DC | PRN

## 2023-08-18 NOTE — ED Notes (Signed)
Patient refused to provide urine sample.

## 2023-08-18 NOTE — ED Notes (Signed)
 Patient returned from CT

## 2023-08-18 NOTE — ED Provider Notes (Addendum)
 [place] EMERGENCY DEPARTMENT AT [facility] Provider Note   CSN: [id number] Arrival date & time: 08/18/23  1947     History  Chief Complaint  Patient presents with   Altered Mental Status    [name] is a 68 y.o. male.  Patient with hx cirrhosis, presents with generalized weakness. Per report, patient seemed generally weak/confused all day today. Pt v limited historian - level 5 caveat. EMS indicates neighbor questioned possible substance use issues. No report of trauma/fall. No report of fevers. Pt denies specific physical c/o.  EMS notes cbg 273.   The history is provided by the patient, medical records and the EMS personnel. The history is limited by the condition of the patient.  Altered Mental Status Associated symptoms: no abdominal pain, no fever and no headaches        Home Medications Prior to Admission medications   Medication Sig Start Date End Date Taking? Authorizing Provider  albuterol (PROVENTIL) (2.5 MG/3ML) 0.083% nebulizer solution Take 3 mLs (2.5 mg total) by nebulization every 6 (six) hours as needed for wheezing or shortness of breath. 10/29/21   Cobb, Ruby Cola, NP  albuterol (PROVENTIL) (2.5 MG/3ML) 0.083% nebulizer solution Take 3 mLs (2.5 mg total) by nebulization every 6 (six) hours as needed for wheezing or shortness of breath. 06/29/23 06/28/24  Parrett, Virgel Bouquet, NP  albuterol (VENTOLIN HFA) 108 (90 Base) MCG/ACT inhaler INHALE 1-2 PUFFS INTO THE LUNGS EVERY 6 HOURS AS NEEDED FOR WHEEZING OR SHORTNESS OF BREATH 11/03/22   Parrett, Virgel Bouquet, NP  b complex vitamins tablet Take 1 tablet by mouth daily.     [provider]  bisoprolol (ZEBETA) 10 MG tablet Take 1 tablet (10 mg total) by mouth daily. 08/09/22   Carlean Jews, NP  clobetasol cream (TEMOVATE) 0.05 % Apply 1 Application topically 2 (two) times daily. 10/24/22   Carlean Jews, NP  clotrimazole-betamethasone (LOTRISONE) cream Apply 1 Application topically daily.  12/14/22   Louann Sjogren, DPM  colchicine 0.6 MG tablet TAKE 2 TABLETS BY MOUTH AT ONCE. THEN TAKE 1 TABLET 1 HOUR LATER. WAIT 2 DAYS, THEN BEGIN TAKING 1 TABLET DAILY TO PREVENT ACUTE GOUT. 09/22/22   Carlean Jews, NP  Continuous Glucose Receiver (DEXCOM G7 RECEIVER) DEVI See admin instructions. 10/20/22   [provider]  Continuous Glucose Sensor (DEXCOM G7 SENSOR) MISC 3 each by Does not apply route every 30 (thirty) days. Apply 1 sensor every 10 days 03/01/23   Carlus Pavlov, MD  dexamethasone (DECADRON) 1 MG tablet Take 1 tablet by mouth once at 11 pm, before coming for labs at 8 am the next morning 05/11/23   Carlus Pavlov, MD  diltiazem (CARDIZEM CD) 120 MG 24 hr capsule TAKE 1 CAPSULE BY MOUTH DAILY 11/23/22   Rollene Rotunda, MD  famotidine (PEPCID) 20 MG tablet One after supper Patient taking differently: One after supper  4 tablets daily 08/04/22   Nyoka Cowden, MD  febuxostat (ULORIC) 40 MG tablet TAKE 1 TABLET BY MOUTH DAILY 12/09/22   Sandre Kitty, MD  Fluticasone-Umeclidin-Vilant [facility] ELLIPTA) 100-62.5-25 MCG/ACT AEPB INHALE 1 PUFF BY MOUTH DAILY 07/17/23   Nyoka Cowden, MD  gabapentin (NEURONTIN) 300 MG capsule Take 3 capsules (900 mg total) by mouth 3 (three) times daily. TAKE 3 CAPSULES BY MOUTH THREE TIMES DAILY FOR NEUROPATHY 04/03/23   Antony Madura, MD  Glucagon 3 MG/DOSE POWD Place 3 mg into the nose once as needed for up to  1 dose. 01/12/22   Carlus Pavlov, MD  insulin glargine (LANTUS SOLOSTAR) 100 UNIT/ML Solostar Pen Inject 75 Units into the skin daily. 06/16/23   Carlus Pavlov, MD  Insulin Pen Needle (EASY COMFORT PEN NEEDLES) 31G X 5 MM MISC USE 4 TIMES DAILY AS DIRECTED 01/25/23   Carlus Pavlov, MD  insulin regular human CONCENTRATED (HUMULIN R U-500 KWIKPEN) 500 UNIT/ML KwikPen Use up to 250-260 units of insulin daily as advised 07/25/23   Carlus Pavlov, MD  ketoconazole (NIZORAL) 2 % cream Apply 1 Application topically daily.  01/31/22   Carlean Jews, NP  losartan (COZAAR) 50 MG tablet TAKE 1 TABLET BY MOUTH DAILY 09/12/22   Carlean Jews, NP  metolazone (ZAROXOLYN) 2.5 MG tablet TAKE ONE TABLET BY MOUTH ON TUESDAYS AND THURSDAYS 30 MINUTES BEFORE TAKING TORSEMIDE AS DIRECTED 04/07/23   Rollene Rotunda, MD  mupirocin ointment (BACTROBAN) 2 % Ply small amount to effected area twice daily for 10 days. 02/17/22   Carlean Jews, NP  nitroGLYCERIN (NITROSTAT) 0.4 MG SL tablet Place 1 tablet (0.4 mg total) under the tongue every 5 (five) minutes x 3 doses as needed for chest pain. 09/05/22   Carlean Jews, NP  oxyCODONE-acetaminophen (PERCOCET) 10-325 MG tablet Take 1.5 tablets by mouth in the morning, at noon, in the evening, and at bedtime. 03/10/20   [provider]  potassium chloride SA (KLOR-CON M) 20 MEQ tablet Take 1 tablet (20 mEq total) by mouth daily. TAKE AN EXTRA TABLET ON TUESDAY AND THURSDAY 08/04/23   Rollene Rotunda, MD  predniSONE (DELTASONE) 10 MG tablet Take  4 each am x 2 days,   2 each am x 2 days,  1 each am x 2 days and stop 08/01/23   Nyoka Cowden, MD  rosuvastatin (CRESTOR) 10 MG tablet Take 1 tablet (10 mg total) by mouth daily. 11/01/22   Carlean Jews, NP  tacrolimus (PROTOPIC) 0.1 % ointment Apply topically 2 (two) times daily. 11/03/22   Carlean Jews, NP  tamsulosin (FLOMAX) 0.4 MG CAPS capsule TAKE 1 CAPSULE BY MOUTH EACH NIGHT AT BEDTIME 07/15/21   McKenzie, Mardene Celeste, MD  torsemide (DEMADEX) 20 MG tablet TAKE 2 TABELTS BY MOUTH DAILY IN THE MORNING AND 1 IN THE EVENING 04/24/23   Rollene Rotunda, MD  triamcinolone cream (KENALOG) 0.1 % Apply 1 Application topically daily as needed. 11/30/21   Carlean Jews, NP  VITAMIN A PO Take 2,400 mcg by mouth daily.     [provider]  Vitamin D, Ergocalciferol, (DRISDOL) 1.25 MG (50000 UNIT) CAPS capsule Take 1 capsule (50,000 Units total) by mouth every 7 (seven) days. 11/01/22   Carlean Jews, NP  warfarin  (COUMADIN) 5 MG tablet Take 1/2 to 1 tablet daily or as directed 05/25/23   Rollene Rotunda, MD      Allergies    Adhesive [tape] and Latex    Review of Systems   Review of Systems  Unable to perform ROS: Mental status change  Constitutional:  Negative for fever.  Respiratory:  Negative for shortness of breath.   Cardiovascular:  Negative for chest pain.  Gastrointestinal:  Negative for abdominal pain.  Musculoskeletal:  Negative for back pain and neck pain.  Neurological:  Negative for headaches.    Physical Exam Updated Vital Signs BP 101/62   Pulse (!) 58   Temp 98.8 F (37.1 C) (Oral)   Resp 17   SpO2 96%  Physical Exam Vitals and nursing note  reviewed.  Constitutional:      Appearance: Normal appearance. He is well-developed.  HENT:     Head: Atraumatic.     Nose: Nose normal.     Mouth/Throat:     Mouth: Mucous membranes are moist.     Pharynx: Oropharynx is clear.  Eyes:     General: No scleral icterus.    Conjunctiva/sclera: Conjunctivae normal.     Pupils: Pupils are equal, round, and reactive to light.  Neck:     Vascular: No carotid bruit.     Trachea: No tracheal deviation.     Comments: Trachea midline. Thyroid not grossly enlarged or tender. No stiffness or rigidity Cardiovascular:     Rate and Rhythm: Normal rate and regular rhythm.     Pulses: Normal pulses.     Heart sounds: Normal heart sounds. No murmur heard.    No friction rub. No gallop.  Pulmonary:     Effort: Pulmonary effort is normal. No accessory muscle usage or respiratory distress.     Breath sounds: Normal breath sounds.  Abdominal:     General: Bowel sounds are normal. There is distension.     Palpations: Abdomen is soft.     Tenderness: There is no abdominal tenderness. There is no guarding.  Genitourinary:    Comments: No cva tenderness. Musculoskeletal:        General: No swelling or tenderness.     Cervical back: Normal range of motion and neck supple. No rigidity or  tenderness.     Comments: CTLS spine, non tender, aligned, no step off. Good passive rom bil extremities without pain or focal bony tenderness.   Skin:    General: Skin is warm and dry.     Findings: No rash.  Neurological:     Mental Status: He is alert.     Comments: Slow to respond. Moves bil extremities purposefully.   Psychiatric:        Mood and Affect: Mood normal.     ED Results / Procedures / Treatments   Labs (all labs ordered are listed, but only abnormal results are displayed) Results for orders placed or performed during the hospital encounter of 08/18/23  Ammonia   Collection Time: 08/18/23  7:53 PM  Result Value Ref Range   Ammonia 42 (H) 9 - 35 umol/L  Comprehensive metabolic panel with GFR   Collection Time: 08/18/23  8:16 PM  Result Value Ref Range   Sodium 143 135 - 145 mmol/L   Potassium 3.2 (L) 3.5 - 5.1 mmol/L   Chloride 102 98 - 111 mmol/L   CO2 29 22 - 32 mmol/L   Glucose, Bld 176 (H) 70 - 99 mg/dL   BUN 38 (H) 8 - 23 mg/dL   Creatinine, Ser 1.61 (H) 0.61 - 1.24 mg/dL   Calcium 8.9 8.9 - 09.6 mg/dL   Total Protein 6.9 6.5 - 8.1 g/dL   Albumin 3.2 (L) 3.5 - 5.0 g/dL   AST 30 15 - 41 U/L   ALT 18 0 - 44 U/L   Alkaline Phosphatase 118 38 - 126 U/L   Total Bilirubin 0.8 0.0 - 1.2 mg/dL   GFR, Estimated 31 (L) >60 mL/min   Anion gap 12 5 - 15  CBC   Collection Time: 08/18/23  8:16 PM  Result Value Ref Range   WBC 3.7 (L) 4.0 - 10.5 K/uL   RBC 3.13 (L) 4.22 - 5.81 MIL/uL   Hemoglobin 9.3 (L) 13.0 - 17.0 g/dL  HCT 29.9 (L) 39.0 - 52.0 %   MCV 95.5 80.0 - 100.0 fL   MCH 29.7 26.0 - 34.0 pg   MCHC 31.1 30.0 - 36.0 g/dL   RDW 57.8 (H) 46.9 - 62.9 %   Platelets 72 (L) 150 - 400 K/uL   nRBC 0.0 0.0 - 0.2 %  Protime-INR   Collection Time: 08/18/23  8:16 PM  Result Value Ref Range   Prothrombin Time 21.0 (H) 11.4 - 15.2 seconds   INR 1.8 (H) 0.8 - 1.2  Ethanol   Collection Time: 08/18/23  8:16 PM  Result Value Ref Range   Alcohol, Ethyl (B) <10  <10 mg/dL   *Note: Due to a large number of results and/or encounters for the requested time period, some results have not been displayed. A complete set of results can be found in Results Review.       EKG EKG Interpretation Date/Time:  Friday August 18 2023 20:02:30 EDT Ventricular Rate:  66 PR Interval:  190 QRS Duration:  93 QT Interval:  408 QTC Calculation: 428 R Axis:   40  Text Interpretation: Sinus rhythm Nonspecific T wave abnormality Confirmed by Cathren Laine ([id number]) on 08/18/2023 8:24:08 PM  Radiology CT Head Wo Contrast Result Date: 08/18/2023 CLINICAL DATA:  Mental status change EXAM: CT HEAD WITHOUT CONTRAST TECHNIQUE: Contiguous axial images were obtained from the base of the skull through the vertex without intravenous contrast. RADIATION DOSE REDUCTION: This exam was performed according to the departmental dose-optimization program which includes automated exposure control, adjustment of the mA and/or kV according to patient size and/or use of iterative reconstruction technique. COMPARISON:  CT brain 10/26/2022 FINDINGS: Brain: No acute territorial infarction, hemorrhage or intracranial mass. The ventricles are nonenlarged. Vascular: No hyperdense vessels.  No unexpected calcification Skull: Normal. Negative for fracture or focal lesion. Sinuses/Orbits: Mucosal thickening in the sinuses Other: None IMPRESSION: Negative non contrasted CT appearance of the brain. Electronically Signed   By: Jasmine Pang M.D.   On: 08/18/2023 21:47   DG Chest Port 1 View Result Date: 08/18/2023 CLINICAL DATA:  Weakness EXAM: PORTABLE CHEST 1 VIEW COMPARISON:  06/29/2023 FINDINGS: Cardiomegaly. No acute airspace disease, pleural effusion or pneumothorax. Aortic atherosclerosis IMPRESSION: No active disease. Cardiomegaly. Electronically Signed   By: Jasmine Pang M.D.   On: 08/18/2023 20:29    Procedures Procedures    Medications Ordered in ED Medications  lactulose (CHRONULAC) 10 GM/15ML  solution 10 g (has no administration in time range)  lactated ringers bolus 1,000 mL (1,000 mLs Intravenous New Bag/Given 08/18/23 2219)    ED Course/ Medical Decision Making/ A&P                                 Medical Decision Making Problems Addressed: Acute alteration in mental status: acute illness or injury with systemic symptoms that poses a threat to life or bodily functions AKI (acute kidney injury) [facility]): acute illness or injury with systemic symptoms that poses a threat to life or bodily functions Chronic anemia: chronic illness or injury Confusion: acute illness or injury with systemic symptoms that poses a threat to life or bodily functions History of cirrhosis: chronic illness or injury with exacerbation, progression, or side effects of treatment that poses a threat to life or bodily functions Hyperammonemia (HCC): acute illness or injury with systemic symptoms that poses a threat to life or bodily functions Thrombocytopenia (HCC): chronic illness or injury  Amount  and/or Complexity of Data Reviewed Independent Historian: EMS    Details: Ems/family, hx External Data Reviewed: labs and notes. Labs: ordered. Decision-making details documented in ED Course. Radiology: ordered and independent interpretation performed. Decision-making details documented in ED Course. ECG/medicine tests: ordered and independent interpretation performed. Decision-making details documented in ED Course. Discussion of management or test interpretation with external provider(s): medicine  Risk Prescription drug management. Decision regarding hospitalization.   Iv ns. Continuous pulse ox and cardiac monitoring. Labs ordered/sent. Imaging ordered.   Differential diagnosis includes  . Dispo decision including potential need for admission considered - will get labs and imaging and reassess.   Reviewed nursing notes and prior charts for additional history. External reports reviewed. Additional  history from:  Cardiac monitor: sinus rhythm, rate  Labs reviewed/interpreted by me - aki, k low. Mg added. LR bolus. Ammonia elev. Lactulose po. Chronic anemia and chr low plt.   Xrays reviewed/interpreted by me - no pna.   CT reviewed/interpreted by me - no hem.  Hospitalists consulted for admission.  CRITICAL CARE RE: acute alteration in mental status, encephalopathy/hepatic encephalopathy, dehydration/aki, Performed by: Suzi Roots Total critical care time: 45 minutes Critical care time was exclusive of separately billable procedures and treating other patients. Critical care was necessary to treat or prevent imminent or life-threatening deterioration. Critical care was time spent personally by me on the following activities: development of treatment plan with patient and/or surrogate as well as nursing, discussions with consultants, evaluation of patient's response to treatment, examination of patient, obtaining history from patient or surrogate, ordering and performing treatments and interventions, ordering and review of laboratory studies, ordering and review of radiographic studies, pulse oximetry and re-evaluation of patient's condition.          Final Clinical Impression(s) / ED Diagnoses Final diagnoses:  Confusion  Acute alteration in mental status  History of cirrhosis  Hyperammonemia (HCC)  AKI (acute kidney injury) (HCC)  Chronic anemia  Thrombocytopenia (HCC)    Rx / DC Orders ED Discharge Orders     None            Cathren Laine, MD 08/18/23 2315

## 2023-08-18 NOTE — ED Notes (Signed)
 Patient transported to CT

## 2023-08-18 NOTE — ED Triage Notes (Signed)
 Patient was at home. Wife told EMS that patient was normal between 2 to 4 this afternoon. Wife went outside to get him for dinner and found him hard to arouse in his motorized wheelchair in the backyard. Patient spent unknown amount of time outside. EMS stated that wife said patient normally has tremors and slurred speech. EMS VS 273 cbg 110/50 BP of fluids given by EMS

## 2023-08-18 NOTE — Progress Notes (Signed)
  Carryover admission to the Day Admitter.  I discussed this case with the EDP, Dr. Denton Lank.  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  Will he has a history of alcoholic cirrhosis, patient's daughter conveys that the patient has been abstinent for awhile, although the specific duration of this is unclear.  In the ED he is noted to be awake and alert, but confused.   His labs are notable for acute kidney injury, with creatinine 2.3 compared to most recent prior value of 1.8.  He received a 1 L LR bolus in the ED. Potassium low at 3.2, for which he has received Kcl 40 meq po x 1 dose.  His ammonia level is 45, without prior ammonia level available per initial chart review.  Total bilirubin 0.8, INR 1.8.  Urinalysis and urinary drug screen ordered as ordered by the EDP, with results currently pending.  Serum ethanol level was less than 10.  CBC is reported to show mild pancytopenia, consistent with his history of chronic mild pancytopenia.  Chest x-ray showed no evidence of acute cardiopulmonary process.  CT head showed no evidence of acute intracranial process.  In the ED also received lactulose 10 g p.o. x 1 dose.  I have placed an order for inpatient admission to med/tele for further evaluation and management of the above.  I have placed some additional preliminary admit orders via the adult multi-morbid admission order set. I have also ordered morning labs in the form of CMP, CBC, magnesium level, INR.  As component of workup/management of his acute encephalopathy, also ordered fall precautions, B12 level, TSH.  For his AKI, UA is pending. I added random urine sodium as  well as random urine creatinine.  Regarding his generalized weakness, I have ordered PT/OT consults for the morning.    Ryan Pigg, DO Hospitalist

## 2023-08-18 NOTE — ED Notes (Signed)
 Daughter at bedside, daughter updated about patient's care.

## 2023-08-19 DIAGNOSIS — G9340 Encephalopathy, unspecified: Secondary | ICD-10-CM | POA: Diagnosis not present

## 2023-08-19 LAB — COMPREHENSIVE METABOLIC PANEL WITH GFR
ALT: 17 U/L (ref 0–44)
AST: 31 U/L (ref 15–41)
Albumin: 3.2 g/dL — ABNORMAL LOW (ref 3.5–5.0)
Alkaline Phosphatase: 110 U/L (ref 38–126)
Anion gap: 9 (ref 5–15)
BUN: 36 mg/dL — ABNORMAL HIGH (ref 8–23)
CO2: 31 mmol/L (ref 22–32)
Calcium: 9.3 mg/dL (ref 8.9–10.3)
Chloride: 105 mmol/L (ref 98–111)
Creatinine, Ser: 2.04 mg/dL — ABNORMAL HIGH (ref 0.61–1.24)
GFR, Estimated: 36 mL/min — ABNORMAL LOW (ref >60)
Glucose, Bld: 212 mg/dL — ABNORMAL HIGH (ref 70–99)
Potassium: 3.8 mmol/L (ref 3.5–5.1)
Sodium: 145 mmol/L (ref 135–145)
Total Bilirubin: 0.8 mg/dL (ref 0.0–1.2)
Total Protein: 7.2 g/dL (ref 6.5–8.1)

## 2023-08-19 LAB — CBC WITH DIFFERENTIAL/PLATELET
Abs Immature Granulocytes: 0.01 10*3/uL (ref 0.00–0.07)
Basophils Absolute: 0 10*3/uL (ref 0.0–0.1)
Basophils Relative: 0 %
Eosinophils Absolute: 0.1 10*3/uL (ref 0.0–0.5)
Eosinophils Relative: 2 %
HCT: 29.7 % — ABNORMAL LOW (ref 39.0–52.0)
Hemoglobin: 9.6 g/dL — ABNORMAL LOW (ref 13.0–17.0)
Immature Granulocytes: 0 %
Lymphocytes Relative: 26 %
Lymphs Abs: 0.9 10*3/uL (ref 0.7–4.0)
MCH: 30.3 pg (ref 26.0–34.0)
MCHC: 32.3 g/dL (ref 30.0–36.0)
MCV: 93.7 fL (ref 80.0–100.0)
Monocytes Absolute: 0.3 10*3/uL (ref 0.1–1.0)
Monocytes Relative: 10 %
Neutro Abs: 2.1 10*3/uL (ref 1.7–7.7)
Neutrophils Relative %: 62 %
Platelets: 71 10*3/uL — ABNORMAL LOW (ref 150–400)
RBC: 3.17 MIL/uL — ABNORMAL LOW (ref 4.22–5.81)
RDW: 17.2 % — ABNORMAL HIGH (ref 11.5–15.5)
WBC: 3.4 10*3/uL — ABNORMAL LOW (ref 4.0–10.5)
nRBC: 0 % (ref 0.0–0.2)

## 2023-08-19 LAB — GLUCOSE, CAPILLARY
Glucose-Capillary: 297 mg/dL — ABNORMAL HIGH (ref 70–99)
Glucose-Capillary: 406 mg/dL — ABNORMAL HIGH (ref 70–99)
Glucose-Capillary: 387 mg/dL — ABNORMAL HIGH (ref 70–99)

## 2023-08-19 LAB — VITAMIN B12: Vitamin B-12: 1043 pg/mL — ABNORMAL HIGH (ref 180–914)

## 2023-08-19 LAB — HEMOGLOBIN A1C
Hgb A1c MFr Bld: 9.4 % — ABNORMAL HIGH (ref 4.8–5.6)
Mean Plasma Glucose: 223.08 mg/dL

## 2023-08-19 LAB — PROTIME-INR
INR: 1.6 — ABNORMAL HIGH (ref 0.8–1.2)
Prothrombin Time: 19.3 s — ABNORMAL HIGH (ref 11.4–15.2)

## 2023-08-19 LAB — HIV ANTIBODY (ROUTINE TESTING W REFLEX): HIV Screen 4th Generation wRfx: NONREACTIVE

## 2023-08-19 LAB — TSH: TSH: 2.965 u[IU]/mL (ref 0.350–4.500)

## 2023-08-19 LAB — MAGNESIUM: Magnesium: 2.3 mg/dL (ref 1.7–2.4)

## 2023-08-19 LAB — CBG MONITORING, ED: Glucose-Capillary: 270 mg/dL — ABNORMAL HIGH (ref 70–99)

## 2023-08-19 MED ORDER — TAMSULOSIN HCL 0.4 MG PO CAPS
0.4000 mg | ORAL_CAPSULE | Freq: Every day | ORAL | Status: DC
  Administered 2023-08-19 – 2023-08-20 (×2): 0.4 mg via ORAL
  Filled 2023-08-19 (×2): qty 1

## 2023-08-19 MED ORDER — OXYCODONE-ACETAMINOPHEN 5-325 MG PO TABS: 1.0000 | ORAL_TABLET | ORAL | Status: DC | PRN

## 2023-08-19 MED ORDER — HALOPERIDOL LACTATE 5 MG/ML IJ SOLN
1.0000 mg | Freq: Four times a day (QID) | INTRAMUSCULAR | Status: DC | PRN
Start: 2023-08-19 — End: 2023-08-20

## 2023-08-19 MED ORDER — INSULIN GLARGINE-YFGN 100 UNIT/ML ~~LOC~~ SOLN
20.0000 [IU] | Freq: Every day | SUBCUTANEOUS | Status: DC
  Administered 2023-08-19: 20 [IU] via SUBCUTANEOUS
  Filled 2023-08-19 (×2): qty 0.2

## 2023-08-19 MED ORDER — COLCHICINE 0.6 MG PO TABS
0.6000 mg | ORAL_TABLET | Freq: Every day | ORAL | Status: DC
  Administered 2023-08-19 – 2023-08-21 (×3): 0.6 mg via ORAL
  Filled 2023-08-19 (×3): qty 1

## 2023-08-19 MED ORDER — BISOPROLOL FUMARATE 10 MG PO TABS
10.0000 mg | ORAL_TABLET | Freq: Every day | ORAL | Status: DC
Start: 2023-08-19 — End: 2023-08-21
  Administered 2023-08-21: 10 mg via ORAL
  Filled 2023-08-19 (×3): qty 1

## 2023-08-19 MED ORDER — IPRATROPIUM-ALBUTEROL 0.5-2.5 (3) MG/3ML IN SOLN
3.0000 mL | Freq: Four times a day (QID) | RESPIRATORY_TRACT | Status: DC
  Administered 2023-08-19 – 2023-08-20 (×4): 3 mL via RESPIRATORY_TRACT
  Filled 2023-08-19 (×4): qty 3

## 2023-08-19 MED ORDER — INSULIN ASPART 100 UNIT/ML IJ SOLN
0.0000 [IU] | Freq: Three times a day (TID) | INTRAMUSCULAR | Status: DC
  Administered 2023-08-19: 8 [IU] via SUBCUTANEOUS
  Administered 2023-08-19: 15 [IU] via SUBCUTANEOUS
  Administered 2023-08-20: 3 [IU] via SUBCUTANEOUS
  Administered 2023-08-20: 5 [IU] via SUBCUTANEOUS
  Administered 2023-08-20 – 2023-08-21 (×2): 8 [IU] via SUBCUTANEOUS

## 2023-08-19 MED ORDER — SUCRALFATE 1 GM/10ML PO SUSP
1.0000 g | Freq: Three times a day (TID) | ORAL | Status: DC
  Administered 2023-08-19 – 2023-08-21 (×6): 1 g via ORAL
  Filled 2023-08-19 (×6): qty 10

## 2023-08-19 MED ORDER — SODIUM CHLORIDE 0.9 % IV BOLUS
500.0000 mL | Freq: Once | INTRAVENOUS | Status: AC
  Administered 2023-08-19: 500 mL via INTRAVENOUS

## 2023-08-19 MED ORDER — INSULIN ASPART 100 UNIT/ML IJ SOLN
0.0000 [IU] | Freq: Every day | INTRAMUSCULAR | Status: DC
  Administered 2023-08-19: 5 [IU] via SUBCUTANEOUS
  Administered 2023-08-20: 2 [IU] via SUBCUTANEOUS

## 2023-08-19 MED ORDER — METHYLPREDNISOLONE SODIUM SUCC 40 MG IJ SOLR
40.0000 mg | INTRAMUSCULAR | Status: DC
  Administered 2023-08-19: 40 mg via INTRAVENOUS
  Filled 2023-08-19: qty 1

## 2023-08-19 MED ORDER — OXYCODONE HCL 5 MG PO TABS: 5.0000 mg | ORAL_TABLET | ORAL | Status: DC | PRN

## 2023-08-19 MED ORDER — OXYCODONE-ACETAMINOPHEN 10-325 MG PO TABS: 1.0000 | ORAL_TABLET | ORAL | Status: DC | PRN

## 2023-08-19 MED ORDER — POTASSIUM CHLORIDE CRYS ER 20 MEQ PO TBCR
40.0000 meq | EXTENDED_RELEASE_TABLET | Freq: Once | ORAL | Status: AC
  Administered 2023-08-19: 40 meq via ORAL
  Filled 2023-08-19: qty 2

## 2023-08-19 MED ORDER — GABAPENTIN 300 MG PO CAPS
300.0000 mg | ORAL_CAPSULE | Freq: Two times a day (BID) | ORAL | Status: DC
  Administered 2023-08-19 – 2023-08-21 (×5): 300 mg via ORAL
  Filled 2023-08-19 (×5): qty 1

## 2023-08-19 MED ORDER — ROSUVASTATIN CALCIUM 20 MG PO TABS
20.0000 mg | ORAL_TABLET | Freq: Every day | ORAL | Status: DC
  Administered 2023-08-19 – 2023-08-21 (×3): 20 mg via ORAL
  Filled 2023-08-19 (×3): qty 1

## 2023-08-19 MED ORDER — HALOPERIDOL 1 MG PO TABS: 1.0000 mg | ORAL_TABLET | Freq: Four times a day (QID) | ORAL | Status: DC | PRN

## 2023-08-19 MED ORDER — INSULIN ASPART 100 UNIT/ML IJ SOLN
10.0000 [IU] | Freq: Once | INTRAMUSCULAR | Status: AC
  Administered 2023-08-20: 10 [IU] via SUBCUTANEOUS

## 2023-08-19 MED ORDER — LOSARTAN POTASSIUM 50 MG PO TABS
50.0000 mg | ORAL_TABLET | Freq: Every day | ORAL | Status: DC
  Administered 2023-08-19: 50 mg via ORAL
  Filled 2023-08-19: qty 1

## 2023-08-19 MED ORDER — WARFARIN - PHARMACIST DOSING INPATIENT: Freq: Every day | Status: DC

## 2023-08-19 MED ORDER — OXYCODONE-ACETAMINOPHEN 5-325 MG PO TABS
1.5000 | ORAL_TABLET | ORAL | Status: DC | PRN
  Administered 2023-08-19 – 2023-08-21 (×4): 1.5 via ORAL
  Filled 2023-08-19 (×4): qty 2

## 2023-08-19 MED ORDER — NITROGLYCERIN 0.4 MG SL SUBL: 0.4000 mg | SUBLINGUAL_TABLET | SUBLINGUAL | Status: DC | PRN

## 2023-08-19 MED ORDER — METOPROLOL TARTRATE 25 MG PO TABS
25.0000 mg | ORAL_TABLET | Freq: Two times a day (BID) | ORAL | Status: DC
Start: 2023-08-19 — End: 2023-08-19
  Filled 2023-08-19: qty 1

## 2023-08-19 MED ORDER — BUDESONIDE 0.25 MG/2ML IN SUSP
0.2500 mg | Freq: Two times a day (BID) | RESPIRATORY_TRACT | Status: DC
  Administered 2023-08-19 – 2023-08-20 (×3): 0.25 mg via RESPIRATORY_TRACT
  Filled 2023-08-19 (×3): qty 2

## 2023-08-19 MED ORDER — FEBUXOSTAT 40 MG PO TABS
40.0000 mg | ORAL_TABLET | Freq: Every day | ORAL | Status: DC
  Administered 2023-08-19 – 2023-08-21 (×3): 40 mg via ORAL
  Filled 2023-08-19 (×3): qty 1

## 2023-08-19 MED ORDER — INSULIN GLARGINE-YFGN 100 UNIT/ML ~~LOC~~ SOLN
40.0000 [IU] | Freq: Every day | SUBCUTANEOUS | Status: DC
  Administered 2023-08-20 – 2023-08-21 (×2): 40 [IU] via SUBCUTANEOUS
  Filled 2023-08-19 (×2): qty 0.4

## 2023-08-19 MED ORDER — WARFARIN SODIUM 2.5 MG PO TABS
2.5000 mg | ORAL_TABLET | Freq: Once | ORAL | Status: AC
  Administered 2023-08-19: 2.5 mg via ORAL
  Filled 2023-08-19: qty 1

## 2023-08-19 MED ORDER — OXYCODONE HCL 5 MG PO TABS
5.0000 mg | ORAL_TABLET | ORAL | Status: DC | PRN
  Administered 2023-08-19 – 2023-08-21 (×5): 5 mg via ORAL
  Filled 2023-08-19 (×5): qty 1

## 2023-08-19 MED ORDER — HYDRALAZINE HCL 20 MG/ML IJ SOLN: 10.0000 mg | Freq: Four times a day (QID) | INTRAMUSCULAR | Status: DC | PRN

## 2023-08-19 MED ORDER — INSULIN GLARGINE-YFGN 100 UNIT/ML ~~LOC~~ SOLN
20.0000 [IU] | Freq: Once | SUBCUTANEOUS | Status: AC
  Administered 2023-08-20: 20 [IU] via SUBCUTANEOUS
  Filled 2023-08-19: qty 0.2

## 2023-08-19 MED ORDER — OXYCODONE HCL 5 MG PO TABS
5.0000 mg | ORAL_TABLET | Freq: Once | ORAL | Status: AC
  Administered 2023-08-19: 5 mg via ORAL
  Filled 2023-08-19: qty 1

## 2023-08-19 MED ORDER — INSULIN GLARGINE-YFGN 100 UNIT/ML ~~LOC~~ SOLN: 50.0000 [IU] | Freq: Every day | SUBCUTANEOUS | Status: DC

## 2023-08-19 MED ORDER — SODIUM CHLORIDE 0.9% FLUSH
3.0000 mL | Freq: Two times a day (BID) | INTRAVENOUS | Status: DC
  Administered 2023-08-19 – 2023-08-21 (×4): 3 mL via INTRAVENOUS

## 2023-08-19 NOTE — Progress Notes (Signed)
 Patient adamant about going home and refusing to keep on tele monitor. Sent epic chat to the attending and NP. Attending came to beside to talk with patient. Patient seemed willing to stay after speaking with attending.   About an hour or so later, patient mentioning again that he will leave. He states he'll "adjust" his own medicine. Still refusing tele. Informed patient the tele monitor allows [place] to monitor his heart rhythm. Patient still refusing. Called patient's wife and she said she'll be here soon. Sent epic chat to attending and NP for update.

## 2023-08-19 NOTE — Evaluation (Signed)
 Occupational Therapy Evaluation Patient Details Name: [name] MRN: [id number] DOB: 10/30/1955 Today's Date: 08/19/2023   History of Present Illness   68 yo male admitted 4/4 with AMS, AKI, hypokalemia. 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 Pt using power wheelchair occasionally at home, but reports he does not normally need it. Pt oriented to person, and place, with using compensatory techniques for date/time. Of note was not oriented in PT session ~30 mins prior, so fair memory. Pt with decreased safety awareness, balance, strength, problem solving, and executive function on evaluation. Needing up to CGA for LB and OOB ADL with intermittent min A for balance. Pt to continue to benefit from acute OT and HHOT at discharge.      If plan is discharge home, recommend the following:   A little help with walking and/or transfers;A little help with bathing/dressing/bathroom;Assistance with cooking/housework;Direct supervision/assist for medications management;Direct supervision/assist for financial management;Assist for transportation;Help with stairs or ramp for entrance     Functional Status Assessment   Patient has had a recent decline in their functional status and demonstrates the ability to make significant improvements in function in a reasonable and predictable amount of time.     Equipment Recommendations   None recommended by OT     Recommendations for Other Services         Precautions/Restrictions   Precautions Precautions: Fall Recall of Precautions/Restrictions: Impaired     Mobility Bed Mobility               General bed mobility comments: OOB in recliner chair on arrival and departure    Transfers Overall transfer level: Needs assistance   Transfers: Sit  to/from Stand Sit to Stand: Contact guard assist           General transfer comment: for safety with cues for hand placement      Balance Overall balance assessment: Needs assistance Sitting-balance support: No upper extremity supported, Feet supported Sitting balance-Leahy Scale: Fair Sitting balance - Comments: able to don socks and shoes with figure 4 position and back support from chair   Standing balance support: Reliant on assistive device for balance, Bilateral upper extremity supported Standing balance-Leahy Scale: Poor                             ADL either performed or assessed with clinical judgement   ADL Overall ADL's : Needs assistance/impaired Eating/Feeding: Independent   Grooming: Contact guard assist;Standing   Upper Body Bathing: Set up;Sitting   Lower Body Bathing: Contact guard assist;Sit to/from stand   Upper Body Dressing : Set up;Sitting   Lower Body Dressing: Contact guard assist;Sit to/from stand   Toilet Transfer: Ambulation;Contact guard assist Toilet Transfer Details (indicate cue type and reason): pushing IV pole         Functional mobility during ADLs: Contact guard assist;Minimal assistance (pushing IV pole)       Vision Ability to See in Adequate Light: 0 Adequate Patient Visual Report: Other (comment) (pt reports recently buring eyes with chemical and they have bothered him for a few months since) Additional Comments: pt report burning eyes with chemical a few months ago and that they have bothered him since. pt able to read up close and further distances on eval     Perception  Praxis         Pertinent Vitals/Pain Pain Assessment Pain Assessment: Faces Faces Pain Scale: Hurts even more Pain Location: back, generalized Pain Descriptors / Indicators: Aching Pain Intervention(s): Limited activity within patient's tolerance, Monitored during session     Extremity/Trunk Assessment Upper Extremity  Assessment Upper Extremity Assessment: Generalized weakness (UE and LE neuropathy at baseline per pt report)   Lower Extremity Assessment Lower Extremity Assessment: Generalized weakness   Cervical / Trunk Assessment Cervical / Trunk Assessment: Kyphotic   Communication Communication Communication: No apparent difficulties   Cognition Arousal: Alert Behavior During Therapy: WFL for tasks assessed/performed Cognition: No family/caregiver present to determine baseline, Cognition impaired   Orientation impairments: Time (able to recall date was on board and using as a compensatory technique) Awareness: Intellectual awareness intact, Online awareness impaired Memory impairment (select all impairments): Short-term memory Attention impairment (select first level of impairment): Alternating attention Executive functioning impairment (select all impairments): Reasoning, Problem solving                   Following commands: Impaired Following commands impaired: Follows one step commands with increased time     Cueing  General Comments   Cueing Techniques: Verbal cues;Gestural cues;Tactile cues      Exercises     Shoulder Instructions      Home Living Family/patient expects to be discharged to:: Private residence Living Arrangements: Spouse/significant other Available Help at Discharge: Family;Available 24 hours/day Type of Home: House Home Access: Level entry     Home Layout: One level     Bathroom Shower/Tub: Walk-in shower (zero entry)         Home Equipment: Rollator (4 wheels);Shower seat;Cane - single point;Wheelchair - power          Prior Functioning/Environment Prior Level of Function : Needs assist       Physical Assist : ADLs (physical)   ADLs (physical): IADLs Mobility Comments: pt reports he walks with rollator and uses power chair for longer distance, baseline tremor and states no falls ADLs Comments: pt reports independence with ADLs.  wife has been doing the driving recently    OT Problem List: Decreased strength;Decreased activity tolerance;Impaired balance (sitting and/or standing);Decreased cognition   OT Treatment/Interventions: Self-care/ADL training;Therapeutic exercise;DME and/or AE instruction;Balance training;Patient/family education;Therapeutic activities      OT Goals(Current goals can be found in the care plan section)   Acute Rehab OT Goals Patient Stated Goal: go home OT Goal Formulation: With patient Time For Goal Achievement: 09/02/23 Potential to Achieve Goals: Good   OT Frequency:  Min 1X/week    Co-evaluation              AM-PAC OT "6 Clicks" Daily Activity     Outcome Measure Help from another person eating meals?: None Help from another person taking care of personal grooming?: A Little Help from another person toileting, which includes using toliet, bedpan, or urinal?: A Little Help from another person bathing (including washing, rinsing, drying)?: A Little Help from another person to put on and taking off regular upper body clothing?: A Little Help from another person to put on and taking off regular lower body clothing?: A Little 6 Click Score: 19   End of Session Equipment Utilized During Treatment: Gait belt Nurse Communication: Mobility status  Activity Tolerance: Patient tolerated treatment well Patient left: in chair;with call bell/phone within reach;with chair alarm set  OT Visit Diagnosis: Unsteadiness on feet (R26.81);Muscle weakness (generalized) (M62.81);Other symptoms and signs involving cognitive function  Time: 1610-9604 OT Time Calculation (min): 20 min Charges:  OT General Charges $OT Visit: 1 Visit OT Evaluation $OT Eval Moderate Complexity: 1 Mod  Tyler Deis, OTR/L [facility] Acute Rehabilitation Office: [phone]   Myrla Halsted 08/19/2023, 12:55 PM

## 2023-08-19 NOTE — Progress Notes (Addendum)
 Patient is confused and disorientated, not clear to me if  he understands the consequences of leaving the hospital against medical advise.  His wife left the hospital did not agree in taking him home.  At this point seems unsafe for patient to leave the hospital.   I spoke with his wife over the phone and she agrees that he is not at his baseline and agrees that is not safe for him to go home.  Her son will come tonight to be with patient.  Change to regular diet  Will order haldol as needed for agitation.

## 2023-08-19 NOTE — H&P (Addendum)
 History and Physical    Patient: [name]:[id number] DOB: 04-24-1956 DOA: 08/18/2023 DOS: the patient was seen and examined on 08/19/2023 PCP: Audie Pinto, FNP  Patient coming from: Home  Medical readiness/disposition: Potentially ready for discharge on 08/20/2023.  Will discharge back to home with wife  Chief Complaint:  Chief Complaint  Patient presents with   Altered Mental Status   HPI: [name] is a 68 y.o. male with medical history significant of hypertension, HFpEF, COPD/pulmonary hypertension with a history of tobacco abuse, sleep apnea, chronic atrial fibrillation, dyslipidemia, GERD, diabetic peripheral neuropathy, diabetes mellitus 2 previously on insulin, spinal stenosis with mobility issues.  Patient brought to the ER by EMS after wife noticed he was difficult to arouse.  Patient was sitting up in his metal rise wheelchair in the backyard.  It is unknown how long the patient had been outside.  Last seen normal was between 2 and 4 that afternoon.  At baseline it has been documented that patient has tremors and slurred speech.  In the field CBG was 273, BP was 110/50 so EMS administered 500 cc of fluids.  Patient was transported to [facility].  Upon arrival, patient was afebrile, normotensive, sats were normal.  Potassium was slightly low at 3.2, BUN slightly elevated at 38 and creatinine 2.27 with baseline between 1.2 and 1.8.  Stable chronic pancytopenia in patient with known cirrhosis.  Noncontrasted CT of the head showed no acute findings.  Chest x-ray showed no active disease.  Hospitalist service was consulted to evaluate the patient for admission.  Review of Systems: As mentioned in the history of present illness. All other systems reviewed and are negative.  Past Medical History:  Diagnosis Date   Actinic keratosis    Arthritis    lower back   Asthma    Atrial fibrillation [facility])    Atrial flutter [facility])    s/p CTI ablation by Dr Johney Frame   Brain  aneurysm 2009   questionable. A follow up CTA in 2009 showed no evidence of   Chronic back pain    DDD/stenosis   Colon polyps    9 polyps removed 10/13/11   Complication of anesthesia 09/11/2012   slow to awaken after ablation   COPD (chronic obstructive pulmonary disease) (HCC)    Diabetes mellitus    takes Metformin and Glimepiride daily   Emphysema    GERD (gastroesophageal reflux disease)    takes Omeprazole bid   Heart failure (HCC)    History of shingles    HLD (hyperlipidemia)    takes Pravastatin daily   HTN (hypertension)    takes Prinizide daily   Obesity    OSA (obstructive sleep apnea)    not always using cpap   Overdose 2009   unintentional Flecanide overdose   Peripheral neuropathy    Short-term memory loss    Tobacco abuse    Past Surgical History:  Procedure Laterality Date   ANTERIOR CERVICAL DECOMP/DISCECTOMY FUSION  01/04/2012   Procedure: ANTERIOR CERVICAL DECOMPRESSION/DISCECTOMY FUSION 1 LEVEL/HARDWARE REMOVAL;  Surgeon: Cristi Loron, MD;  Location: [facility] NEURO ORS;  Service: Neurosurgery;  Laterality: N/A;  explore cervical fusion Cervical six - seven  with removal of codman plate anterior cervical decompression with fusion interbody prothesis plating and bonegraft   APPENDECTOMY  2-48yrs ago   ATRIAL FIBRILLATION ABLATION N/A 09/11/2012   PT DID NOT HAVE AN ATRIAL FIBRILLATION ABLATION IN 2014!  ATRIAL FLUTTER ABLATION ONLY   ATRIAL FLUTTER ABLATION  09/11/2012   CTI ablation by Dr Johney Frame   BIOPSY  11/07/2019   Procedure: BIOPSY;  Surgeon: Rachael Fee, MD;  Location: [facility] ENDOSCOPY;  Service: Endoscopy;;   CARDIAC CATHETERIZATION  2008   no significant CAD   CARDIOVERSION  05/05/2011   Procedure: CARDIOVERSION;  Surgeon: Lewayne Bunting, MD;  Location: [facility] ENDOSCOPY;  Service: Cardiovascular;  Laterality: N/A;   CARDIOVERSION Bilateral 07/26/2012   Procedure: CARDIOVERSION;  Surgeon: Rollene Rotunda, MD;  Location: [facility] ENDOSCOPY;  Service:  Cardiovascular;  Laterality: Bilateral;   CARPAL TUNNEL RELEASE  99/2000   bilateral   COLONOSCOPY WITH PROPOFOL N/A 12/13/2012   Procedure: COLONOSCOPY WITH PROPOFOL;  Surgeon: Rachael Fee, MD;  Location: [facility] ENDOSCOPY;  Service: Endoscopy;  Laterality: N/A;   ELECTROPHYSIOLOGIC STUDY N/A 04/21/2015   Procedure: Atrial Fibrillation Ablation;  Surgeon: Hillis Range, MD;  Location: [facility] INVASIVE CV LAB;  Service: Cardiovascular;  Laterality: N/A;   ESOPHAGOGASTRODUODENOSCOPY (EGD) WITH PROPOFOL N/A 11/07/2019   Procedure: ESOPHAGOGASTRODUODENOSCOPY (EGD) WITH PROPOFOL;  Surgeon: Rachael Fee, MD;  Location: [facility] ENDOSCOPY;  Service: Endoscopy;  Laterality: N/A;   HERNIA REPAIR     KNEE ARTHROSCOPY WITH MEDIAL MENISECTOMY Left 04/23/2021   Procedure: LEFT KNEE ARTHROSCOPY WITH PARTIAL MEDIAL MENISCECTOMY;  Surgeon: Eldred Manges, MD;  Location: [facility] OR;  Service: Orthopedics;  Laterality: Left;   KNEE SURGERY  6-51yrs ago   left   LEFT HEART CATH AND CORONARY ANGIOGRAPHY N/A 09/19/2016   Procedure: Left Heart Cath and Coronary Angiography;  Surgeon: Lyn Records, MD;  Location: [facility] INVASIVE CV LAB;  Service: Cardiovascular;  Laterality: N/A;   Left inguinal hernia repair     as a child   NASAL SEPTOPLASTY W/ TURBINOPLASTY Bilateral 02/19/2014   Procedure: NASAL SEPTOPLASTY WITH BILATERAL TURBINATE REDUCTION;  Surgeon: Flo Shanks, MD;  Location: [facility] OR;  Service: ENT;  Laterality: Bilateral;   NECK SURGERY  57yrs ago   right shoulder surgery  4-61yrs ago   cyst removed   TEE WITHOUT CARDIOVERSION  05/05/2011   Procedure: TRANSESOPHAGEAL ECHOCARDIOGRAM (TEE);  Surgeon: Lewayne Bunting, MD;  Location: [facility] ENDOSCOPY;  Service: Cardiovascular;  Laterality: N/A;   TEE WITHOUT CARDIOVERSION N/A 04/21/2015   Procedure: TRANSESOPHAGEAL ECHOCARDIOGRAM (TEE);  Surgeon: Quintella Reichert, MD;  Location: [facility] ENDOSCOPY;  Service: Cardiovascular;  Laterality: N/A;   THROAT SURGERY  4-100yrs ago   "thought " it was  cancer but came back not   TONSILLECTOMY     Social History:  reports that he has been smoking cigarettes and e-cigarettes. He has a 49 pack-year smoking history. He has never used smokeless tobacco. He reports that he does not currently use alcohol. He reports that he does not use drugs.  Allergies  Allergen Reactions   Adhesive [Tape]     itching   Latex Itching    When tape is on the skin too long skin gets red & itching    Family History  Problem Relation Age of Onset   Diabetes Mother    Heart disease Father    Lung cancer Father    Diabetes Maternal Grandmother    Colon cancer Neg Hx    Anesthesia problems Neg Hx    Hypotension Neg Hx    Malignant hyperthermia Neg Hx    Pseudochol deficiency Neg Hx    Esophageal cancer Neg Hx    Pancreatic cancer Neg Hx    Stomach cancer Neg Hx     Prior to Admission medications  Medication Sig Start Date End Date Taking? Authorizing Provider  warfarin (COUMADIN) 5 MG tablet Take 1/2 to 1 tablet daily or as directed Patient taking differently: Take 2.5-5 mg by mouth daily with supper. Take 1/2 tablet by mouth every Fri and take 1 tablet by mouth all other days or as directed by Coumadin clinic 05/25/23  Yes Rollene Rotunda, MD  albuterol (PROVENTIL) (2.5 MG/3ML) 0.083% nebulizer solution Take 3 mLs (2.5 mg total) by nebulization every 6 (six) hours as needed for wheezing or shortness of breath. 10/29/21   Cobb, Ruby Cola, NP  albuterol (PROVENTIL) (2.5 MG/3ML) 0.083% nebulizer solution Take 3 mLs (2.5 mg total) by nebulization every 6 (six) hours as needed for wheezing or shortness of breath. 06/29/23 06/28/24  Parrett, Virgel Bouquet, NP  albuterol (VENTOLIN HFA) 108 (90 Base) MCG/ACT inhaler INHALE 1-2 PUFFS INTO THE LUNGS EVERY 6 HOURS AS NEEDED FOR WHEEZING OR SHORTNESS OF BREATH 11/03/22   Parrett, Virgel Bouquet, NP  b complex vitamins tablet Take 1 tablet by mouth daily.     [provider]  bisoprolol (ZEBETA) 10 MG tablet Take 1 tablet (10  mg total) by mouth daily. 08/09/22   Carlean Jews, NP  clobetasol cream (TEMOVATE) 0.05 % Apply 1 Application topically 2 (two) times daily. 10/24/22   Carlean Jews, NP  clotrimazole-betamethasone (LOTRISONE) cream Apply 1 Application topically daily. 12/14/22   Louann Sjogren, DPM  colchicine 0.6 MG tablet TAKE 2 TABLETS BY MOUTH AT ONCE. THEN TAKE 1 TABLET 1 HOUR LATER. WAIT 2 DAYS, THEN BEGIN TAKING 1 TABLET DAILY TO PREVENT ACUTE GOUT. 09/22/22   Carlean Jews, NP  Continuous Glucose Receiver (DEXCOM G7 RECEIVER) DEVI See admin instructions. 10/20/22   [provider]  Continuous Glucose Sensor (DEXCOM G7 SENSOR) MISC 3 each by Does not apply route every 30 (thirty) days. Apply 1 sensor every 10 days 03/01/23   Carlus Pavlov, MD  dexamethasone (DECADRON) 1 MG tablet Take 1 tablet by mouth once at 11 pm, before coming for labs at 8 am the next morning 05/11/23   Carlus Pavlov, MD  diltiazem (CARDIZEM CD) 120 MG 24 hr capsule TAKE 1 CAPSULE BY MOUTH DAILY 11/23/22   Rollene Rotunda, MD  famotidine (PEPCID) 20 MG tablet One after supper Patient taking differently: One after supper  4 tablets daily 08/04/22   Nyoka Cowden, MD  febuxostat (ULORIC) 40 MG tablet TAKE 1 TABLET BY MOUTH DAILY 12/09/22   Sandre Kitty, MD  Fluticasone-Umeclidin-[facility] ELLIPTA) 100-62.5-25 MCG/ACT AEPB INHALE 1 PUFF BY MOUTH DAILY 07/17/23   Nyoka Cowden, MD  gabapentin (NEURONTIN) 300 MG capsule Take 3 capsules (900 mg total) by mouth 3 (three) times daily. TAKE 3 CAPSULES BY MOUTH THREE TIMES DAILY FOR NEUROPATHY 04/03/23   Antony Madura, MD  Glucagon 3 MG/DOSE POWD Place 3 mg into the nose once as needed for up to 1 dose. 01/12/22   Carlus Pavlov, MD  insulin glargine (LANTUS SOLOSTAR) 100 UNIT/ML Solostar Pen Inject 75 Units into the skin daily. 06/16/23   Carlus Pavlov, MD  Insulin Pen Needle (EASY COMFORT PEN NEEDLES) 31G X 5 MM MISC USE 4 TIMES DAILY AS DIRECTED 01/25/23    Carlus Pavlov, MD  insulin regular human CONCENTRATED (HUMULIN R U-500 KWIKPEN) 500 UNIT/ML KwikPen Use up to 250-260 units of insulin daily as advised 07/25/23   Carlus Pavlov, MD  ketoconazole (NIZORAL) 2 % cream Apply 1 Application topically daily. 01/31/22   Carlean Jews,  NP  losartan (COZAAR) 50 MG tablet TAKE 1 TABLET BY MOUTH DAILY 09/12/22   Carlean Jews, NP  metolazone (ZAROXOLYN) 2.5 MG tablet TAKE ONE TABLET BY MOUTH ON TUESDAYS AND THURSDAYS 30 MINUTES BEFORE TAKING TORSEMIDE AS DIRECTED 04/07/23   Rollene Rotunda, MD  mupirocin ointment (BACTROBAN) 2 % Ply small amount to effected area twice daily for 10 days. 02/17/22   Carlean Jews, NP  nitroGLYCERIN (NITROSTAT) 0.4 MG SL tablet Place 1 tablet (0.4 mg total) under the tongue every 5 (five) minutes x 3 doses as needed for chest pain. 09/05/22   Carlean Jews, NP  oxyCODONE-acetaminophen (PERCOCET) 10-325 MG tablet Take 1.5 tablets by mouth in the morning, at noon, in the evening, and at bedtime. 03/10/20   [provider]  potassium chloride SA (KLOR-CON M) 20 MEQ tablet Take 1 tablet (20 mEq total) by mouth daily. TAKE AN EXTRA TABLET ON TUESDAY AND THURSDAY 08/04/23   Rollene Rotunda, MD  predniSONE (DELTASONE) 10 MG tablet Take  4 each am x 2 days,   2 each am x 2 days,  1 each am x 2 days and stop 08/01/23   Nyoka Cowden, MD  rosuvastatin (CRESTOR) 10 MG tablet Take 1 tablet (10 mg total) by mouth daily. 11/01/22   Carlean Jews, NP  tacrolimus (PROTOPIC) 0.1 % ointment Apply topically 2 (two) times daily. 11/03/22   Carlean Jews, NP  tamsulosin (FLOMAX) 0.4 MG CAPS capsule TAKE 1 CAPSULE BY MOUTH EACH NIGHT AT BEDTIME 07/15/21   McKenzie, Mardene Celeste, MD  torsemide (DEMADEX) 20 MG tablet TAKE 2 TABELTS BY MOUTH DAILY IN THE MORNING AND 1 IN THE EVENING 04/24/23   Rollene Rotunda, MD  triamcinolone cream (KENALOG) 0.1 % Apply 1 Application topically daily as needed. 11/30/21   Carlean Jews, NP   VITAMIN A PO Take 2,400 mcg by mouth daily.     [provider]  Vitamin D, Ergocalciferol, (DRISDOL) 1.25 MG (50000 UNIT) CAPS capsule Take 1 capsule (50,000 Units total) by mouth every 7 (seven) days. 11/01/22   Carlean Jews, NP    Physical Exam: Vitals:   08/19/23 0041 08/19/23 0043 08/19/23 0147 08/19/23 0500  BP: (!) 114/52  (!) 101/57   Pulse: 62  (!) 58   Resp: (!) 22 20 19    Temp: (!) 97.5 F (36.4 C)  98 F (36.7 C)   TempSrc: Oral  Axillary   SpO2: 100%  96%   Weight:    119.9 kg   Constitutional: NAD, calm, comfortable-sitting up in chair Respiratory: Diffuse bilateral wheezing on posterior exam, normal respiratory effort. No accessory muscle use.  Room air Cardiovascular: Regular rate and rhythm, no murmurs / rubs / gallops. No extremity edema. 2+ pedal pulses.   Abdomen: no tenderness, no masses palpated. Bowel sounds positive.  Musculoskeletal: no clubbing / cyanosis. No joint deformity upper and lower extremities.  no contractures.  Skin: no rashes, lesions, ulcers. No induration Neurologic: CN 2-12 grossly intact. Sensation intact. Strength 3-4/5 x all 4 extremities.  Notable for bilateral upper extremity tremors especially with activity.  Has baseline slurred speech. Psychiatric: Alert and oriented x 3. Normal mood.     Data Reviewed:  Initial labs: Sodium 143, potassium 3.2, CO2 29, glucose 176, BUN 38, creatinine 2.27, anion gap 12, albumin 3.2, LFTs are normal  Ammonia 42  WBC 3700, differential not obtained, hemoglobin 9.3, platelets 72,000  PT 21, INR 1.8  Alcohol level <10  AM  labs: Sodium 145, potassium 3.8, CO2 31, glucose 212, BUN 36, creatinine 2.04, albumin 3.2, magnesium 2.3  WBC 3400 with normal differential, hemoglobin 9.6, platelets 71,000  PT 19.3 INR 1.6  Assessment and Plan: Acute metabolic encephalopathy Etiology unclear-ammonia level only mildly elevated-was given 1 dose of lactulose in the ED Mentation has  significantly improved Need clarification as to whether patient is still on Neurontin at home noting this can contribute to altered mentation; patient reported to bedside RN that he also takes Oxycodone at home but this has not been confirmed.  He does have some spinal stenosis so I will give him a one-time dose until meds can be clarified On exam patient does have signs of COPD exacerbation with bilateral wheezing and could have had some transient hypoxemia prior to arrival  Acute kidney injury on CKD 3B Baseline creatinine appears to be between 1.2 and 1.8 Creatinine has decreased from 2.27 to 2.04 since arrival.  He had received 500 cc of fluid in the field Will give another 500 cc today Suspect patient may have been volume depleted noting he takes Zaroxolyn and Demadex at home (now on hold)-it was documented he was sitting in the backyard for unknown period of time and given temperatures near 90 degrees yesterday this could have precipitated acute dehydration event  Acute COPD exacerbation Bilateral wheezing on exam but not hypoxic Begin scheduled DuoNebs with budesonide nebs Solu-Medrol 40 mg IV daily On Trelegy and as needed albuterol nebs as well as as needed Ventolin MDI as needed at home  HFpEF/pulmonary hypertension Appears hypovolemic and otherwise compensated ARB and diuretics on hold and no signs of peripheral edema Consider adjusting diuretic schedule to as needed  Diabetes mellitus 2 with hyperglycemia CBGs up into the 200s Need to clarify if taking insulin at home Follow CBGs and provide SSI Check hemoglobin A1c  Cirrhosis with chronic pancytopenia Compensated Platelets and WBC at baseline Ammonia only mildly elevated at presentation and not significant enough to explain patient's altered mentation Not on lactulose prior to admission  Chronic atrial fibrillation Continue warfarin Rate controlled Received call from nurse stating patient had a 20 beat run of WCT.   Unclear if VT or aberrantly conducted AF. Mg WNL but K <4 so will give Kdur 40 meq x 1 Need to clarify if remains on beta-blocker or calcium channel blocker; given run of WCT will go ahead and initiate a low-dose beta-blocker in the event he is having beta-blocker withdrawal symptoms  Hypertension With acute kidney injury we are holding Cozaar Initiate Apresoline IV as needed with parameters  Dyslipidemia ?  On Crestor at home  Spinal stenosis with gait disturbance Peripheral neuropathy secondary to diabetes At home patient apparently utilizes a motorized wheelchair although he told me he typically walks until he is tired PT/OT evaluation   Advance Care Planning:   Code Status: Full Code   VTE prophylaxis: Warfarin  Consults: None  Family Communication: Patient only  Severity of Illness: The appropriate patient status for this patient is INPATIENT. Inpatient status is judged to be reasonable and necessary in order to provide the required intensity of service to ensure the patient's safety. The patient's presenting symptoms, physical exam findings, and initial radiographic and laboratory data in the context of their chronic comorbidities is felt to place them at high risk for further clinical deterioration. Furthermore, it is not anticipated that the patient will be medically stable for discharge from the hospital within 2 midnights of admission.   * I certify  that at the point of admission it is my clinical judgment that the patient will require inpatient hospital care spanning beyond 2 midnights from the point of admission due to high intensity of service, high risk for further deterioration and high frequency of surveillance required.*  Author: Junious Silk, NP 08/19/2023 6:45 AM  For on call review [email or web address].

## 2023-08-19 NOTE — Progress Notes (Signed)
 Pt not willing to participate in admission questionnaire at this time. Will retry prior to end of shift.

## 2023-08-19 NOTE — ED Notes (Signed)
 Courtesy call given to 2W floor RN. Pt going to [id number]. ED Charge nurse [facility] aware.

## 2023-08-19 NOTE — Progress Notes (Signed)
 Patient adamant about leaving hospital, and found to be out in the hallway with charge RN encouraging patient to go back to his room. Patient is a fall risk. Patient continuously walking up the hall and further from his room. Wheelchair was brought by NT for patient to sit in. Again, patient refusing. After unsuccessful attempts of trying to get the patient to go back to his room, security was called. Security was able to get patient in wheelchair. Attending in route to bedside.

## 2023-08-19 NOTE — Progress Notes (Signed)
 PHARMACY - ANTICOAGULATION CONSULT NOTE  Pharmacy Consult for warfarin Indication: atrial fibrillation  Allergies  Allergen Reactions   Adhesive [Tape]     itching   Latex Itching    When tape is on the skin too long skin gets red & itching    Patient Measurements: Weight: 119.9 kg (264 lb 5.3 oz)  Vital Signs: Temp: 97.9 F (36.6 C) (04/05 0725) Temp Source: Oral (04/05 0725) BP: 137/69 (04/05 0725) Pulse Rate: 58 (04/05 0725)  Labs: Recent Labs    08/18/23 2016 08/19/23 0741  HGB 9.3* 9.6*  HCT 29.9* 29.7*  PLT 72* 71*  LABPROT 21.0* 19.3*  INR 1.8* 1.6*  CREATININE 2.27* 2.04*    Estimated Creatinine Clearance: 49.7 mL/min (A) (by C-G formula based on SCr of 2.04 mg/dL (H)).   Medical History: Past Medical History:  Diagnosis Date   Actinic keratosis    Arthritis    lower back   Asthma    Atrial fibrillation [facility])    Atrial flutter (HCC)    s/p CTI ablation by Dr Johney Frame   Brain aneurysm 2009   questionable. A follow up CTA in 2009 showed no evidence of   Chronic back pain    DDD/stenosis   Colon polyps    9 polyps removed 10/13/11   Complication of anesthesia 09/11/2012   slow to awaken after ablation   COPD (chronic obstructive pulmonary disease) (HCC)    Diabetes mellitus    takes Metformin and Glimepiride daily   Emphysema    GERD (gastroesophageal reflux disease)    takes Omeprazole bid   Heart failure (HCC)    History of shingles    HLD (hyperlipidemia)    takes Pravastatin daily   HTN (hypertension)    takes Prinizide daily   Obesity    OSA (obstructive sleep apnea)    not always using cpap   Overdose 2009   unintentional Flecanide overdose   Peripheral neuropathy    Short-term memory loss    Tobacco abuse     Medications:  Scheduled:   budesonide (PULMICORT) nebulizer solution  0.25 mg Nebulization BID   ipratropium-albuterol  3 mL Nebulization Q6H   methylPREDNISolone (SOLU-MEDROL) injection  40 mg Intravenous Q24H   sodium  chloride flush  3 mL Intravenous Q12H   Infusions:   Assessment: 68 yo male on warfarin PTA for atrial flutter. Pharmacy consulted to restart warfarin therapy.   Per anticoag visit note on 07/18/2023, home dose is 5 mg on Fri and 2.5 mg all other days, confrimed with pt. . Last dose was on 4/3. INR today is 1.6, Hgb: 9.6, plt 71.   Goal of Therapy:  INR 2-3 Monitor platelets by anticoagulation protocol: Yes   Plan:  Warfarin 2.5 mg x 1 today per home dose Daily INR and CBC Monitor for s/sx of bleeding  Ashlynn Gunnels I Dayle Sherpa 08/19/2023,9:06 AM

## 2023-08-19 NOTE — Evaluation (Signed)
 Physical Therapy Evaluation Patient Details Name: [name] MRN: [id number] DOB: 1955/08/16 Today's Date: 08/19/2023  History of Present Illness  68 yo male admitted 4/4 with AMS, AKI, hypokalemia. PMhx: alcoholic cirrhosis complicated by chronic pancytopenia, COPD, HTN, PAF, HLD, neuropathy, T2DM  Clinical Impression  Pt sitting EOB on arrival, unaware of date or situation stating he wants to go home and frustrated with lack of food and not knowing why he is in the "hotel of horror". Pt with UB tremor throughout session which he reports as baseline with decreased strength, cognition, transfers and function. Pt also reports getting chemicals in his eyes months ago and having burning eyes ever since. Pt walks with rollator and uses power chair for longer distance at baseline, denied falls. Pt will benefit from acute therapy to maximize mobility, safety and function to decrease burden of care.        If plan is discharge home, recommend the following: A little help with walking and/or transfers;A little help with bathing/dressing/bathroom;Assistance with cooking/housework;Direct supervision/assist for financial management;Supervision due to cognitive status;Assist for transportation;Direct supervision/assist for medications management   Can travel by private vehicle        Equipment Recommendations None recommended by PT  Recommendations for Other Services  OT consult    Functional Status Assessment Patient has had a recent decline in their functional status and demonstrates the ability to make significant improvements in function in a reasonable and predictable amount of time.     Precautions / Restrictions Precautions Precautions: Fall Recall of Precautions/Restrictions: Impaired      Mobility  Bed Mobility               General bed mobility comments: pt sitting EOB on arrival    Transfers Overall transfer level: Needs assistance   Transfers: Sit to/from Stand Sit  to Stand: Contact guard assist           General transfer comment: CGA for safety with cues for hand placement from bed x 2 and to chair    Ambulation/Gait Ambulation/Gait assistance: Min assist Gait Distance (Feet): 100 Feet Assistive device: Rolling walker (2 wheels) Gait Pattern/deviations: Step-through pattern, Decreased stride length, Trunk flexed   Gait velocity interpretation: <1.8 ft/sec, indicate of risk for recurrent falls   General Gait Details: mod cues and min physical assist for balance, cues to step into RW, extend trunk and safely control RW. Pt impulsive, too posterior to RW, maintaining flexed trunk. pt reaching for environment and rail despite RW presence and stopped 3 x to lean against wall with pt self regulating distance  Stairs            Wheelchair Mobility     Tilt Bed    Modified Rankin (Stroke Patients Only)       Balance Overall balance assessment: Needs assistance Sitting-balance support: No upper extremity supported, Feet supported Sitting balance-Leahy Scale: Fair Sitting balance - Comments: pt able to don single sock/shoe EOB   Standing balance support: Reliant on assistive device for balance, Bilateral upper extremity supported Standing balance-Leahy Scale: Poor Standing balance comment: pt with UB support and reliance on RW in standing                             Pertinent Vitals/Pain Pain Assessment Pain Assessment: 0-10 Pain Score: 4  Pain Location: back, generalized Pain Descriptors / Indicators: Aching Pain Intervention(s): Limited activity within patient's tolerance, Monitored during session, Repositioned  Home Living Family/patient expects to be discharged to:: Private residence Living Arrangements: Spouse/significant other Available Help at Discharge: Family;Available 24 hours/day Type of Home: House Home Access: Level entry       Home Layout: One level Home Equipment: Rollator (4 wheels);Shower  seat;Cane - single point;Wheelchair - power      Prior Function Prior Level of Function : Needs assist       Physical Assist : ADLs (physical)   ADLs (physical): IADLs Mobility Comments: pt reports he walks with rollator and uses power chair for longer distance, baseline tremor and states no falls ADLs Comments: pt reports independence with ADLs     Extremity/Trunk Assessment   Upper Extremity Assessment Upper Extremity Assessment: Generalized weakness    Lower Extremity Assessment Lower Extremity Assessment: Generalized weakness    Cervical / Trunk Assessment Cervical / Trunk Assessment: Kyphotic  Communication   Communication Communication: No apparent difficulties    Cognition Arousal: Alert Behavior During Therapy: WFL for tasks assessed/performed   PT - Cognitive impairments: No family/caregiver present to determine baseline, Orientation, Awareness, Memory, Problem solving, Safety/Judgement   Orientation impairments: Time, Situation                   PT - Cognition Comments: pt reporting RW is causing his imbalance not him bending over, shaking and stepping posterior to RW or grabbing environment Following commands: Impaired Following commands impaired: Follows one step commands with increased time, Follows one step commands inconsistently     Cueing Cueing Techniques: Verbal cues, Gestural cues, Tactile cues     General Comments      Exercises     Assessment/Plan    PT Assessment Patient needs continued PT services  PT Problem List Decreased strength;Decreased activity tolerance;Decreased balance;Decreased mobility;Decreased knowledge of precautions;Decreased safety awareness;Decreased knowledge of use of DME;Decreased cognition;Decreased coordination;Pain       PT Treatment Interventions DME instruction;Gait training;Functional mobility training;Therapeutic activities;Therapeutic exercise;Patient/family education;Cognitive  remediation;Neuromuscular re-education;Balance training    PT Goals (Current goals can be found in the Care Plan section)  Acute Rehab PT Goals Patient Stated Goal: return home PT Goal Formulation: With patient Time For Goal Achievement: 09/02/23 Potential to Achieve Goals: Fair    Frequency Min 2X/week     Co-evaluation               AM-PAC PT "6 Clicks" Mobility  Outcome Measure Help needed turning from your back to your side while in a flat bed without using bedrails?: A Little Help needed moving from lying on your back to sitting on the side of a flat bed without using bedrails?: A Little Help needed moving to and from a bed to a chair (including a wheelchair)?: A Little Help needed standing up from a chair using your arms (e.g., wheelchair or bedside chair)?: A Little Help needed to walk in hospital room?: A Little Help needed climbing 3-5 steps with a railing? : A Lot 6 Click Score: 17    End of Session Equipment Utilized During Treatment: Gait belt Activity Tolerance: Patient tolerated treatment well Patient left: in chair;with call bell/phone within reach;with chair alarm set Nurse Communication: Mobility status PT Visit Diagnosis: Other abnormalities of gait and mobility (R26.89);Difficulty in walking, not elsewhere classified (R26.2);Muscle weakness (generalized) (M62.81)    Time: 1610-9604 PT Time Calculation (min) (ACUTE ONLY): 20 min   Charges:   PT Evaluation $PT Eval Moderate Complexity: 1 Mod   PT General Charges $$ ACUTE PT VISIT: 1 Visit  Merryl Hacker, PT Acute Rehabilitation Services Office: [phone]   Enedina Finner Riddhi Grether 08/19/2023, 10:20 AM

## 2023-08-19 NOTE — Plan of Care (Signed)
   Problem: Safety: Goal: Ability to remain free from injury will improve Outcome: Progressing   Problem: Skin Integrity: Goal: Risk for impaired skin integrity will decrease Outcome: Progressing

## 2023-08-20 DIAGNOSIS — D61818 Other pancytopenia: Secondary | ICD-10-CM

## 2023-08-20 DIAGNOSIS — N189 Chronic kidney disease, unspecified: Secondary | ICD-10-CM

## 2023-08-20 DIAGNOSIS — G9341 Metabolic encephalopathy: Secondary | ICD-10-CM | POA: Diagnosis not present

## 2023-08-20 DIAGNOSIS — N179 Acute kidney failure, unspecified: Secondary | ICD-10-CM

## 2023-08-20 DIAGNOSIS — I5033 Acute on chronic diastolic (congestive) heart failure: Secondary | ICD-10-CM

## 2023-08-20 LAB — RAPID URINE DRUG SCREEN, HOSP PERFORMED
Amphetamines: NOT DETECTED
Barbiturates: NOT DETECTED
Benzodiazepines: NOT DETECTED
Cocaine: NOT DETECTED
Opiates: NOT DETECTED
Tetrahydrocannabinol: NOT DETECTED

## 2023-08-20 LAB — BASIC METABOLIC PANEL WITH GFR
Anion gap: 10 (ref 5–15)
BUN: 35 mg/dL — ABNORMAL HIGH (ref 8–23)
CO2: 28 mmol/L (ref 22–32)
Calcium: 9.3 mg/dL (ref 8.9–10.3)
Chloride: 104 mmol/L (ref 98–111)
Creatinine, Ser: 1.56 mg/dL — ABNORMAL HIGH (ref 0.61–1.24)
GFR, Estimated: 49 mL/min — ABNORMAL LOW (ref >60)
Glucose, Bld: 215 mg/dL — ABNORMAL HIGH (ref 70–99)
Potassium: 3.7 mmol/L (ref 3.5–5.1)
Sodium: 142 mmol/L (ref 135–145)

## 2023-08-20 LAB — GLUCOSE, CAPILLARY
Glucose-Capillary: 197 mg/dL — ABNORMAL HIGH (ref 70–99)
Glucose-Capillary: 225 mg/dL — ABNORMAL HIGH (ref 70–99)
Glucose-Capillary: 238 mg/dL — ABNORMAL HIGH (ref 70–99)
Glucose-Capillary: 255 mg/dL — ABNORMAL HIGH (ref 70–99)
Glucose-Capillary: 408 mg/dL — ABNORMAL HIGH (ref 70–99)

## 2023-08-20 LAB — BLOOD GAS, VENOUS
Acid-Base Excess: 7 mmol/L — ABNORMAL HIGH (ref 0.0–2.0)
Bicarbonate: 31.3 mmol/L — ABNORMAL HIGH (ref 20.0–28.0)
Drawn by: 8358
O2 Saturation: 94.2 %
Patient temperature: 37.2
pCO2, Ven: 42 mmHg — ABNORMAL LOW (ref 44–60)
pH, Ven: 7.48 — ABNORMAL HIGH (ref 7.25–7.43)
pO2, Ven: 64 mmHg — ABNORMAL HIGH (ref 32–45)

## 2023-08-20 LAB — CBC
HCT: 30.9 % — ABNORMAL LOW (ref 39.0–52.0)
Hemoglobin: 9.7 g/dL — ABNORMAL LOW (ref 13.0–17.0)
MCH: 29.5 pg (ref 26.0–34.0)
MCHC: 31.4 g/dL (ref 30.0–36.0)
MCV: 93.9 fL (ref 80.0–100.0)
Platelets: 70 10*3/uL — ABNORMAL LOW (ref 150–400)
RBC: 3.29 MIL/uL — ABNORMAL LOW (ref 4.22–5.81)
RDW: 16.9 % — ABNORMAL HIGH (ref 11.5–15.5)
WBC: 5 10*3/uL (ref 4.0–10.5)
nRBC: 0 % (ref 0.0–0.2)

## 2023-08-20 LAB — PROTIME-INR
INR: 1.5 — ABNORMAL HIGH (ref 0.8–1.2)
Prothrombin Time: 18.2 s — ABNORMAL HIGH (ref 11.4–15.2)

## 2023-08-20 LAB — BRAIN NATRIURETIC PEPTIDE: B Natriuretic Peptide: 687.6 pg/mL — ABNORMAL HIGH (ref 0.0–100.0)

## 2023-08-20 MED ORDER — LOSARTAN POTASSIUM 50 MG PO TABS: 50.0000 mg | ORAL_TABLET | Freq: Every day | ORAL | Status: DC

## 2023-08-20 MED ORDER — PANTOPRAZOLE SODIUM 40 MG PO TBEC
40.0000 mg | DELAYED_RELEASE_TABLET | Freq: Every day | ORAL | Status: DC
  Administered 2023-08-20 – 2023-08-21 (×2): 40 mg via ORAL
  Filled 2023-08-20 (×2): qty 1

## 2023-08-20 MED ORDER — IPRATROPIUM-ALBUTEROL 0.5-2.5 (3) MG/3ML IN SOLN: 3.0000 mL | Freq: Four times a day (QID) | RESPIRATORY_TRACT | Status: DC | PRN

## 2023-08-20 MED ORDER — INSULIN ASPART 100 UNIT/ML IJ SOLN
3.0000 [IU] | Freq: Three times a day (TID) | INTRAMUSCULAR | Status: DC
  Administered 2023-08-20 – 2023-08-21 (×2): 3 [IU] via SUBCUTANEOUS

## 2023-08-20 MED ORDER — UMECLIDINIUM BROMIDE 62.5 MCG/ACT IN AEPB
1.0000 | INHALATION_SPRAY | Freq: Every day | RESPIRATORY_TRACT | Status: DC
  Administered 2023-08-21: 1 via RESPIRATORY_TRACT
  Filled 2023-08-20: qty 7

## 2023-08-20 MED ORDER — WARFARIN SODIUM 5 MG PO TABS
5.0000 mg | ORAL_TABLET | Freq: Once | ORAL | Status: AC
  Administered 2023-08-20: 5 mg via ORAL
  Filled 2023-08-20: qty 1

## 2023-08-20 MED ORDER — NICOTINE 21 MG/24HR TD PT24
21.0000 mg | MEDICATED_PATCH | Freq: Every day | TRANSDERMAL | Status: DC
  Administered 2023-08-20 – 2023-08-21 (×2): 21 mg via TRANSDERMAL
  Filled 2023-08-20 (×2): qty 1

## 2023-08-20 MED ORDER — FUROSEMIDE 10 MG/ML IJ SOLN
40.0000 mg | Freq: Four times a day (QID) | INTRAMUSCULAR | Status: AC
Start: 2023-08-20 — End: 2023-08-20
  Administered 2023-08-20 (×2): 40 mg via INTRAVENOUS
  Filled 2023-08-20 (×2): qty 4

## 2023-08-20 MED ORDER — POTASSIUM CHLORIDE CRYS ER 20 MEQ PO TBCR
40.0000 meq | EXTENDED_RELEASE_TABLET | Freq: Once | ORAL | Status: AC
  Administered 2023-08-20: 40 meq via ORAL
  Filled 2023-08-20: qty 2

## 2023-08-20 MED ORDER — FLUTICASONE FUROATE-VILANTEROL 100-25 MCG/ACT IN AEPB
1.0000 | INHALATION_SPRAY | Freq: Every day | RESPIRATORY_TRACT | Status: DC
  Administered 2023-08-21: 1 via RESPIRATORY_TRACT
  Filled 2023-08-20: qty 28

## 2023-08-20 NOTE — Plan of Care (Signed)
?  Problem: Health Behavior/Discharge Planning: ?Goal: Ability to manage health-related needs will improve ?Outcome: Progressing ?  ?Problem: Clinical Measurements: ?Goal: Ability to maintain clinical measurements within normal limits will improve ?Outcome: Progressing ?Goal: Will remain free from infection ?Outcome: Progressing ?Goal: Diagnostic test results will improve ?Outcome: Progressing ?  ?Problem: Nutrition: ?Goal: Adequate nutrition will be maintained ?Outcome: Progressing ?  ?Problem: Coping: ?Goal: Level of anxiety will decrease ?Outcome: Progressing ?  ?

## 2023-08-20 NOTE — Progress Notes (Addendum)
 PROGRESS NOTE   [name]  WUJ:[id number]    DOB: 07/20/1955    DOA: 08/18/2023  PCP: Audie Pinto, FNP   I have briefly reviewed patients previous medical records in [facility].  Chief Complaint  Patient presents with   Altered Mental Status    Brief Hospital Course:  68 year old married male, lives with his spouse, ambulates with the help of a walker, and has a motorized wheelchair extensive medical history significant for hypertension, chronic HFpEF, COPD/pulmonary hypertension, ongoing tobacco abuse, OSA noncompliant with CPAP for years, chronic atrial fibrillation, dyslipidemia, GERD, type II DM with peripheral neuropathy, chronic low back pain follows with pain management, cirrhosis, chronic pancytopenia, chronic tremors and slurred speech, presented to the ED via EMS after spouse noticed that he was difficult to arouse.  Admitted for acute metabolic encephalopathy, suspected due to polypharmacy complicating dehydration/hypotension and acute on chronic kidney disease.   Assessment & Plan:  Principal Problem:   Acute encephalopathy   Acute metabolic encephalopathy: Suspected from polypharmacy (opioids, gabapentin) in the context of acute kidney injury. CT head without acute findings.  TSH 2.965.  BAL <10.  Ammonia 42.  B12: 1043.  UDS and UA ordered but not sent yet.  VBG without CO2 retention. On admission, his opioid dose was reduced. Mental status improved and may be at or approaching baseline. Had threatened to leave AMA yesterday but today appears to be more cooperative.  Delirium precautions.  Acute kidney injury complicating CKD stage IIIa Creatinine in March 2024 was 1.25, November 24 was 1.4 and in February 25: 1.84 Suspect baseline stage IIIa CKD (not 3B as noted in admission notes) Presented with creatinine of 2.27, which is improved to 1.56-may be closer to his baseline AKI probably related to dehydration, hypotension and diuretic use (Zaroxolyn and  Demadex) and ARB-held. Avoid nephrotoxics and monitor BMP.  COPD exacerbation Got a dose of IV Solu-Medrol 40 mg on admission. Clinically improved without clinical bronchospasm at this time.  No further steroids. Continue bronchodilator nebs, flutter valve.  Acute on chronic HFpEF Appears to have quite a bit of volume overload.  BNP 688. 2D echo in February 23: LVEF 65-70%, moderate LVH, mild to moderate aortic valve stenosis. Repeat 2D echo. Since volume overloaded, creatinine also improving, some of elevated creatinine may be related to cardiorenal syndrome, will give IV Lasix 40 mg every 6 hours x 2 doses.  Strict intake output, daily weights and follow clinically. Follows with Dr. Antoine Poche, Cardiology.  Poorly controlled type II DM with hyperglycemia in obese A1c 9.4 Further complicated by a dose of steroid that he got on admission Continue Lantus 40 units daily, NovoLog moderate sensitivity sliding scale, added mealtime NovoLog 4 units 3 times daily. Monitor and adjust insulins as needed.  Cirrhosis complicated by chronic pancytopenia, fluid overload, coagulopathy Diuretic management as above.  Hemoglobin and platelet count stable.  Chronic atrial fibrillation/NSVT Sinus rhythm on telemetry.  Questionable episode of NSVT at 10:47 AM on 4/5. Continue warfarin per pharmacy Continue bisoprolol 10 Mg daily. Follow 2D echo. Attempt to keep potassium >4 and magnesium >2.  Essential hypertension Controlled on bisoprolol 10 Mg daily Holding Cozaar  Dyslipidemia Continue rosuvastatin.  Chronic low back pain/spinal stenosis with ambulatory dysfunction Also has peripheral neuropathy secondary to diabetes Verified gabapentin dose with pharmacy, to be appropriate for his renal function Follows with Dr. Carmel [place], pain management per patient report Judicious use of opioids, avoid dose escalation. Therapies have evaluated and recommend home health PT and  OT  Tobacco  abuse Cessation counseled.  Nicotine patch.  Chronic tremors Patient feels that these are at baseline.  Body mass index is 33.94 kg/m./Obesity complicates care.    DVT prophylaxis: SCDs Start: 08/18/23 2354     Code Status: Full Code:  Family Communication: Discussed with spouse via phone. She spoke to patient via phone, he sounded a little better but not back to baseline. Disposition:  Status is: Inpatient Remains inpatient appropriate because: Acute on chronic HFpEF, AKI that required IV meds and close monitoring.     Consultants:   None  Procedures:     Antimicrobials:      Subjective:  Patient was awake, alert and oriented x 4 today.  Unsure why he was brought to the hospital.  Most of history as noted above.  Able to tell that he takes his oxycodone 10/325 up to 6 times daily but unable to clarify if it takes it 6 times every day or less on other days.  States that he was unable to sleep flat last night due to dyspnea which has been going on even at home for a few days.  Some leg swelling but cannot tell if he has gained any weight.  No chest pain.  Feels that he may be going home today.  Objective:   Vitals:   08/20/23 0428 08/20/23 0818 08/20/23 0820 08/20/23 0821  BP: (!) 119/42 (!) 141/82    Pulse: (!) 54 (!) 47    Resp: 16 19    Temp: 97.8 F (36.6 C) (!) 97.5 F (36.4 C)    TempSrc: Oral Oral    SpO2: 100% 96% 95% 95%  Weight:      Height:        General exam: Middle-age male, moderately built and obese sitting up comfortably at edge of bed. Respiratory system: Reduced breath sounds in the bases but no wheezing, rhonchi or crackles.  No increased work of breathing.  Rest of lung fields clear to auscultation. Cardiovascular system: S1 & S2 heard, RRR. No JVD, murmurs, rubs, gallops or clicks.  1+ pitting bilateral leg edema.  Telemetry personally reviewed: Sinus rhythm.  NSVT on 4/5 at 10:47 AM. Gastrointestinal system: Abdomen is protuberant/obese, soft  and nontender. No organomegaly or masses felt. Normal bowel sounds heard. Central nervous system: Alert and oriented x 4. No focal neurological deficits. Extremities: Symmetric 5 x 5 power.  Intermittent tremors of both upper extremities. Skin: No rashes, lesions or ulcers Psychiatry: Judgement and insight appear somewhat impaired.  Mood & affect appropriate.     Data Reviewed:   I have personally reviewed following labs and imaging studies   CBC: Recent Labs  Lab 08/18/23 2016 08/19/23 0741 08/20/23 0843  WBC 3.7* 3.4* 5.0  NEUTROABS  --  2.1  --   HGB 9.3* 9.6* 9.7*  HCT 29.9* 29.7* 30.9*  MCV 95.5 93.7 93.9  PLT 72* 71* 70*    Basic Metabolic Panel: Recent Labs  Lab 08/18/23 2016 08/19/23 0741 08/20/23 0843  NA 143 145 142  K 3.2* 3.8 3.7  CL 102 105 104  CO2 29 31 28   GLUCOSE 176* 212* 215*  BUN 38* 36* 35*  CREATININE 2.27* 2.04* 1.56*  CALCIUM 8.9 9.3 9.3  MG  --  2.3  --     Liver Function Tests: Recent Labs  Lab 08/18/23 2016 08/19/23 0741  AST 30 31  ALT 18 17  ALKPHOS 118 110  BILITOT 0.8 0.8  PROT 6.9 7.2  ALBUMIN 3.2* 3.2*    CBG: Recent Labs  Lab 08/19/23 2111 08/20/23 0010 08/20/23 0829  GLUCAP 387* 408* 197*    Microbiology Studies:  No results found for this or any previous visit (from the past 240 hours).  Radiology Studies:  CT Head Wo Contrast Result Date: 08/18/2023 CLINICAL DATA:  Mental status change EXAM: CT HEAD WITHOUT CONTRAST TECHNIQUE: Contiguous axial images were obtained from the base of the skull through the vertex without intravenous contrast. RADIATION DOSE REDUCTION: This exam was performed according to the departmental dose-optimization program which includes automated exposure control, adjustment of the mA and/or kV according to patient size and/or use of iterative reconstruction technique. COMPARISON:  CT brain 10/26/2022 FINDINGS: Brain: No acute territorial infarction, hemorrhage or intracranial mass. The  ventricles are nonenlarged. Vascular: No hyperdense vessels.  No unexpected calcification Skull: Normal. Negative for fracture or focal lesion. Sinuses/Orbits: Mucosal thickening in the sinuses Other: None IMPRESSION: Negative non contrasted CT appearance of the brain. Electronically Signed   By: Jasmine Pang M.D.   On: 08/18/2023 21:47   DG Chest Port 1 View Result Date: 08/18/2023 CLINICAL DATA:  Weakness EXAM: PORTABLE CHEST 1 VIEW COMPARISON:  06/29/2023 FINDINGS: Cardiomegaly. No acute airspace disease, pleural effusion or pneumothorax. Aortic atherosclerosis IMPRESSION: No active disease. Cardiomegaly. Electronically Signed   By: Jasmine Pang M.D.   On: 08/18/2023 20:29    Scheduled Meds:    bisoprolol  10 mg Oral Daily   budesonide (PULMICORT) nebulizer solution  0.25 mg Nebulization BID   colchicine  0.6 mg Oral Daily   febuxostat  40 mg Oral Daily   gabapentin  300 mg Oral BID   insulin aspart  0-15 Units Subcutaneous TID WC   insulin aspart  0-5 Units Subcutaneous QHS   insulin glargine-yfgn  40 Units Subcutaneous Daily   rosuvastatin  20 mg Oral Daily   sodium chloride flush  3 mL Intravenous Q12H   sucralfate  1 g Oral TID WC & HS   tamsulosin  0.4 mg Oral QPC supper   warfarin  5 mg Oral ONCE-1600   Warfarin - Pharmacist Dosing Inpatient   Does not apply q1600    Continuous Infusions:     LOS: 2 days     Marcellus Scott, MD,  FACP, [facility], [facility], [facility]   [facility] [place]      To contact the attending provider between 7A-7P or the covering provider during after hours 7P-7A, please log into the web site [email or web address] and access using universal [place] password for that web site. If you do not have the password, please call the hospital operator.  08/20/2023, 12:30 PM

## 2023-08-20 NOTE — Plan of Care (Signed)
  Problem: Health Behavior/Discharge Planning: Goal: Ability to manage health-related needs will improve Outcome: Progressing   Problem: Activity: Goal: Risk for activity intolerance will decrease Outcome: Progressing   Problem: Coping: Goal: Level of anxiety will decrease Outcome: Progressing   Problem: Pain Managment: Goal: General experience of comfort will improve and/or be controlled Outcome: Progressing

## 2023-08-20 NOTE — Progress Notes (Signed)
 PHARMACY - ANTICOAGULATION CONSULT NOTE  Pharmacy Consult for warfarin Indication: atrial fibrillation  Allergies  Allergen Reactions   Adhesive [Tape]     itching   Latex Itching    When tape is on the skin too long skin gets red & itching    Patient Measurements: Height: 6\' 2"  (188 cm) Weight: 119.9 kg (264 lb 5.3 oz) IBW/kg (Calculated) : 82.2  Vital Signs: Temp: 97.5 F (36.4 C) (04/06 0818) Temp Source: Oral (04/06 0818) BP: 141/82 (04/06 0818) Pulse Rate: 47 (04/06 0818)  Labs: Recent Labs    08/18/23 2016 08/19/23 0741 08/20/23 0843  HGB 9.3* 9.6* 9.7*  HCT 29.9* 29.7* 30.9*  PLT 72* 71* 70*  LABPROT 21.0* 19.3* 18.2*  INR 1.8* 1.6* 1.5*  CREATININE 2.27* 2.04*  --     Estimated Creatinine Clearance: 49.7 mL/min (A) (by C-G formula based on SCr of 2.04 mg/dL (H)).   Medical History: Past Medical History:  Diagnosis Date   Actinic keratosis    Arthritis    lower back   Asthma    Atrial fibrillation [facility])    Atrial flutter (HCC)    s/p CTI ablation by Dr Johney Frame   Brain aneurysm 2009   questionable. A follow up CTA in 2009 showed no evidence of   Chronic back pain    DDD/stenosis   Colon polyps    9 polyps removed 10/13/11   Complication of anesthesia 09/11/2012   slow to awaken after ablation   COPD (chronic obstructive pulmonary disease) (HCC)    Diabetes mellitus    takes Metformin and Glimepiride daily   Emphysema    GERD (gastroesophageal reflux disease)    takes Omeprazole bid   Heart failure (HCC)    History of shingles    HLD (hyperlipidemia)    takes Pravastatin daily   HTN (hypertension)    takes Prinizide daily   Obesity    OSA (obstructive sleep apnea)    not always using cpap   Overdose 2009   unintentional Flecanide overdose   Peripheral neuropathy    Short-term memory loss    Tobacco abuse     Medications:  Scheduled:   bisoprolol  10 mg Oral Daily   budesonide (PULMICORT) nebulizer solution  0.25 mg Nebulization  BID   colchicine  0.6 mg Oral Daily   febuxostat  40 mg Oral Daily   gabapentin  300 mg Oral BID   insulin aspart  0-15 Units Subcutaneous TID WC   insulin aspart  0-5 Units Subcutaneous QHS   insulin glargine-yfgn  40 Units Subcutaneous Daily   ipratropium-albuterol  3 mL Nebulization Q6H   [START ON 08/21/2023] losartan  50 mg Oral Daily   rosuvastatin  20 mg Oral Daily   sodium chloride flush  3 mL Intravenous Q12H   sucralfate  1 g Oral TID WC & HS   tamsulosin  0.4 mg Oral QPC supper   Warfarin - Pharmacist Dosing Inpatient   Does not apply q1600   Infusions:   Assessment: 68 yo male on warfarin PTA for atrial flutter. Pharmacy consulted to restart warfarin therapy.   Per anticoag visit note on 07/18/2023, home dose is 5 mg on Fri and 2.5 mg all other days, confrimed with pt. Last dose PTA was on 4/3.   INR today is 1.5, pt is eating 100% of meals. CBC is stable and no signs of bleeding noted.   Goal of Therapy:  INR 2-3 Monitor platelets by anticoagulation protocol: Yes  Plan:  Warfarin 5 mg today  Daily INR and CBC Monitor for s/sx of bleeding  [name] I [name] 08/20/2023,9:24 AM

## 2023-08-21 ENCOUNTER — Inpatient Hospital Stay (HOSPITAL_COMMUNITY)

## 2023-08-21 DIAGNOSIS — I5031 Acute diastolic (congestive) heart failure: Secondary | ICD-10-CM

## 2023-08-21 DIAGNOSIS — I5033 Acute on chronic diastolic (congestive) heart failure: Secondary | ICD-10-CM | POA: Diagnosis not present

## 2023-08-21 DIAGNOSIS — N179 Acute kidney failure, unspecified: Secondary | ICD-10-CM | POA: Diagnosis not present

## 2023-08-21 DIAGNOSIS — G9341 Metabolic encephalopathy: Secondary | ICD-10-CM | POA: Diagnosis not present

## 2023-08-21 LAB — CBC
HCT: 30.9 % — ABNORMAL LOW (ref 39.0–52.0)
Hemoglobin: 9.7 g/dL — ABNORMAL LOW (ref 13.0–17.0)
MCH: 29.8 pg (ref 26.0–34.0)
MCHC: 31.4 g/dL (ref 30.0–36.0)
MCV: 95.1 fL (ref 80.0–100.0)
Platelets: 71 10*3/uL — ABNORMAL LOW (ref 150–400)
RBC: 3.25 MIL/uL — ABNORMAL LOW (ref 4.22–5.81)
RDW: 17.2 % — ABNORMAL HIGH (ref 11.5–15.5)
WBC: 4.2 10*3/uL (ref 4.0–10.5)
nRBC: 0 % (ref 0.0–0.2)

## 2023-08-21 LAB — ECHOCARDIOGRAM COMPLETE
AR max vel: 1.51 cm2
AV Area VTI: 1.61 cm2
AV Area mean vel: 1.49 cm2
AV Mean grad: 11 mmHg
AV Peak grad: 23 mmHg
Ao pk vel: 2.4 m/s
Calc EF: 72.5 %
Height: 74 in
S' Lateral: 3.4 cm
Single Plane A2C EF: 76.5 %
Single Plane A4C EF: 67 %
Weight: 4215.2 [oz_av]

## 2023-08-21 LAB — PROTIME-INR
INR: 1.7 — ABNORMAL HIGH (ref 0.8–1.2)
Prothrombin Time: 20.1 s — ABNORMAL HIGH (ref 11.4–15.2)

## 2023-08-21 LAB — COMPREHENSIVE METABOLIC PANEL WITH GFR
ALT: 29 U/L (ref 0–44)
AST: 66 U/L — ABNORMAL HIGH (ref 15–41)
Albumin: 3.2 g/dL — ABNORMAL LOW (ref 3.5–5.0)
Alkaline Phosphatase: 138 U/L — ABNORMAL HIGH (ref 38–126)
Anion gap: 11 (ref 5–15)
BUN: 34 mg/dL — ABNORMAL HIGH (ref 8–23)
CO2: 27 mmol/L (ref 22–32)
Calcium: 9.2 mg/dL (ref 8.9–10.3)
Chloride: 104 mmol/L (ref 98–111)
Creatinine, Ser: 1.5 mg/dL — ABNORMAL HIGH (ref 0.61–1.24)
GFR, Estimated: 51 mL/min — ABNORMAL LOW (ref >60)
Glucose, Bld: 213 mg/dL — ABNORMAL HIGH (ref 70–99)
Potassium: 3.8 mmol/L (ref 3.5–5.1)
Sodium: 142 mmol/L (ref 135–145)
Total Bilirubin: 1.6 mg/dL — ABNORMAL HIGH (ref 0.0–1.2)
Total Protein: 6.9 g/dL (ref 6.5–8.1)

## 2023-08-21 LAB — GLUCOSE, CAPILLARY: Glucose-Capillary: 280 mg/dL — ABNORMAL HIGH (ref 70–99)

## 2023-08-21 MED ORDER — TACROLIMUS 0.1 % EX OINT: 1.0000 | TOPICAL_OINTMENT | Freq: Two times a day (BID) | CUTANEOUS | Status: AC | PRN

## 2023-08-21 MED ORDER — COLCHICINE 0.6 MG PO TABS: 0.6000 mg | ORAL_TABLET | Freq: Every day | ORAL | Status: AC

## 2023-08-21 MED ORDER — NICOTINE 21 MG/24HR TD PT24: 21.0000 mg | MEDICATED_PATCH | Freq: Every day | TRANSDERMAL | 0 refills | Status: AC

## 2023-08-21 MED ORDER — CLOTRIMAZOLE-BETAMETHASONE 1-0.05 % EX CREA: 1.0000 | TOPICAL_CREAM | Freq: Every day | CUTANEOUS | Status: AC | PRN

## 2023-08-21 MED ORDER — CLOBETASOL PROPIONATE 0.05 % EX CREA: 1.0000 | TOPICAL_CREAM | Freq: Two times a day (BID) | CUTANEOUS | Status: AC | PRN

## 2023-08-21 MED ORDER — WARFARIN SODIUM 2.5 MG PO TABS: 2.5000 mg | ORAL_TABLET | Freq: Once | ORAL | Status: DC

## 2023-08-21 MED ORDER — OXYCODONE-ACETAMINOPHEN 10-325 MG PO TABS: 1.0000 | ORAL_TABLET | ORAL | Status: AC

## 2023-08-21 MED ORDER — GABAPENTIN 300 MG PO CAPS: 900.0000 mg | ORAL_CAPSULE | Freq: Two times a day (BID) | ORAL | Status: DC

## 2023-08-21 NOTE — Discharge Summary (Signed)
 Physician Discharge Summary  [name] ZOX:[id number] DOB: Sep 30, 1955  PCP: Audie Pinto, FNP  Admitted from: Home Discharged to: Home  Admit date: 08/18/2023 Discharge date: 08/21/2023  Recommendations for Outpatient Follow-up:    Follow-up Information     Goins, Gwenith Spitz, FNP. Schedule an appointment as soon as possible for a visit in 1 week(s).   Specialty: Family Medicine Why: To be seen with repeat labs (CBC, BMP, PT & INR).  Coumadin dose adjustment as needed.  Please take all your prescription and nonprescription home meds during office visit.  Specifically clarify regarding your insulin dosages and skin creams. Contact information: [address] [place] [address] [phone]         Rollene Rotunda, MD. Schedule an appointment as soon as possible for a visit in 1 week(s).   Specialty: Cardiology Contact information: [address] [place] [address] [phone]         Carmel Sacramento, NP. Schedule an appointment as soon as possible for a visit.   Specialty: Nurse Practitioner Why: Follow-up regarding management of your pain and pain meds. Contact information: [address] [place] [address] [phone]                  Home Health: Home Health Orders (From admission, onward)     Start     Ordered   08/21/23 0959  Home Health  At discharge       Question Answer Comment  To provide the following care/treatments PT   To provide the following care/treatments OT      08/21/23 1001             Equipment/Devices: None    Discharge Condition: Improved and stable.    Code Status: Full Code Diet recommendation:  Discharge Diet Orders (From admission, onward)     Start     Ordered   08/21/23 0000  Diet - low sodium heart healthy       Comments: Recommend fluid restriction of 1500 mL/day.   08/21/23 1001   08/21/23 0000  Diet Carb Modified        08/21/23 1001             Discharge  Diagnoses:  Principal Problem:   Acute encephalopathy   Brief Hospital Course:  68 year old married male, lives with his spouse, ambulates with the help of a walker, and has a motorized wheelchair extensive medical history significant for hypertension, chronic HFpEF, COPD/pulmonary hypertension, ongoing tobacco abuse, OSA noncompliant with CPAP for years, chronic atrial fibrillation, dyslipidemia, GERD, type II DM with peripheral neuropathy, chronic low back pain follows with pain management, cirrhosis, chronic pancytopenia, chronic tremors and slurred speech, presented to the ED via EMS after spouse noticed that he was difficult to arouse.  Admitted for acute metabolic encephalopathy, suspected due to polypharmacy complicating dehydration/hypotension and acute on chronic kidney disease.     Assessment & Plan:   Acute metabolic encephalopathy: Suspected from polypharmacy (opioids, gabapentin) in the context of acute kidney injury. CT head without acute findings.  TSH 2.965.  BAL <10.  Ammonia 42.  B12: 1043.  UDS negative.  VBG without CO2 retention. On admission, his opioid dose was reduced. Mental status has currently improved and may be back to his prior baseline. Discussed in detail with patient and patient's spouse via phone on day of discharge.  He will be discharged on reduced dose of Percocet until close outpatient follow-up with his pain management MD.  Continue prior home dose of gabapentin (checked with pharmacy that dose below is appropriate for his renal function).  Both have been advised to make sure to take all meds both prescription and nonprescription to all their MDs during follow-up visit.   Acute kidney injury complicating CKD stage IIIa Creatinine in March 2024 was 1.25, November 24 was 1.4 and in February 25: 1.84 Suspect baseline stage IIIa CKD (not 3B as noted in admission notes) Presented with creatinine of 2.27, which is improved to 1.56 >1.50 AKI probably related to  dehydration, hypotension and diuretic use (Zaroxolyn and Demadex) and ARB-held. Avoid nephrotoxics and monitor BMP. Creatinine improved and plateaued in the 1.5 range.  Continue to hold ARB until outpatient follow-up with PCP or cardiology.  Blood pressure is well-controlled on current regimen below.   COPD exacerbation Got a dose of IV Solu-Medrol 40 mg on admission. Clinically improved without clinical bronchospasm at this time.  No further steroids. Continue bronchodilator nebs, flutter valve. Resolved.   Acute on chronic HFpEF Appears to have had quite a bit of volume overload.  BNP 688. 2D echo in February 23: LVEF 65-70%, moderate LVH, mild to moderate aortic valve stenosis. Repeat 2D echo.  This was ordered but patient insisting on discharge today.  He has medical decision-making capacity but respectfully wishes to DC home since he feels that he is not getting adequate rest or appropriate food in the hospital.  He states that he will closely follow-up with Dr. Antoine Poche. Since volume overloaded, creatinine also improving, some of elevated creatinine may be related to cardiorenal syndrome, gave IV Lasix 40 mg every 6 hours x 2 doses.  Strict intake output, daily weights and follow clinically. Follows with Dr. Antoine Poche, Cardiology. Intake output charting does not seem to be complete.  Patient stated that maybe he made a little more urine than his baseline. Continue prior home dose of torsemide and metolazone biweekly as noted below with close outpatient follow-up with cardiology.  Recommended low-salt diet (patient states that he had been indulging in sandwich with usual salt) and also advised fluid restriction.   Poorly controlled type II DM with hyperglycemia in obese A1c 9.4 Further complicated by a dose of steroid that he got on admission Treated in house with Lantus 40 units daily, NovoLog moderate sensitivity sliding scale, added mealtime NovoLog 4 units 3 times daily. In discussing  with patient and spouse it is not clearly known as to how much dose of insulins he takes at home.  Both were counseled on day of discharge that he must follow-up with his PCP and take all his meds for review to ensure that he is taking the appropriate meds at the appropriate dose.  They verbalized understanding.   Cirrhosis complicated by chronic pancytopenia, fluid overload, coagulopathy Diuretic management as above.  Hemoglobin and platelet count stable.   Chronic atrial fibrillation/NSVT Sinus rhythm on telemetry.  Questionable episode of NSVT at 10:47 AM on 4/5. Continue warfarin per pharmacy-patient and spouse were advised to make appointment with the outpatient Coumadin clinic. Continue bisoprolol 10 Mg daily. Follow 2D echo.  This can be followed up as outpatient. Attempt to keep potassium >4 and magnesium >2.   Essential hypertension Controlled on bisoprolol 10 Mg daily Holding Cozaar due to recent AKI.  Also given that his heart rates are in the 60s, will continue to hold Cardizem.   Dyslipidemia Continue rosuvastatin.   Chronic low back pain/spinal stenosis with ambulatory dysfunction Also has peripheral neuropathy secondary to diabetes Verified  gabapentin dose below with pharmacy, to be appropriate for his renal function Follows with Orlie Dakin, pain management per patient report Judicious use of opioids, avoid dose escalation. Therapies have evaluated and recommend home health PT and OT   Tobacco abuse Cessation counseled.  Nicotine patch.   Chronic tremors Patient feels that these are at baseline.   Body mass index is 33.94 kg/m./Obesity complicates care.      Consultants:   None   Procedures:       Discharge Instructions  Discharge Instructions     (HEART FAILURE PATIENTS) Call MD:  Anytime you have any of the following symptoms: 1) 3 pound weight gain in 24 hours or 5 pounds in 1 week 2) shortness of breath, with or without a dry hacking cough 3)  swelling in the hands, feet or stomach 4) if you have to sleep on extra pillows at night in order to breathe.   Complete by: As directed    Call MD for:   Complete by: As directed    Recurrent mental status changes.   Call MD for:  difficulty breathing, headache or visual disturbances   Complete by: As directed    Call MD for:  extreme fatigue   Complete by: As directed    Call MD for:  persistant dizziness or light-headedness   Complete by: As directed    Call MD for:  severe uncontrolled pain   Complete by: As directed    Diet - low sodium heart healthy   Complete by: As directed    Recommend fluid restriction of 1500 mL/day.   Diet Carb Modified   Complete by: As directed    Increase activity slowly   Complete by: As directed         Medication List     PAUSE taking these medications    losartan 50 MG tablet Wait to take this until your doctor or other care provider tells you to start again. Commonly known as: COZAAR TAKE 1 TABLET BY MOUTH DAILY       STOP taking these medications    diltiazem 120 MG 24 hr capsule Commonly known as: CARDIZEM CD   predniSONE 10 MG tablet Commonly known as: DELTASONE       TAKE these medications    albuterol 108 (90 Base) MCG/ACT inhaler Commonly known as: VENTOLIN HFA INHALE 1-2 PUFFS INTO THE LUNGS EVERY 6 HOURS AS NEEDED FOR WHEEZING OR SHORTNESS OF BREATH   albuterol (2.5 MG/3ML) 0.083% nebulizer solution Commonly known as: PROVENTIL Take 3 mLs (2.5 mg total) by nebulization every 6 (six) hours as needed for wheezing or shortness of breath.   b complex vitamins tablet Take 1 tablet by mouth every Monday, Wednesday, and Friday.   bisoprolol 10 MG tablet Commonly known as: ZEBETA Take 1 tablet (10 mg total) by mouth daily.   clobetasol cream 0.05 % Commonly known as: TEMOVATE Apply 1 Application topically 2 (two) times daily as needed (Dermatitis).   clotrimazole-betamethasone cream Commonly known as:  LOTRISONE Apply 1 Application topically daily as needed (Fungus).   colchicine 0.6 MG tablet Take 1 tablet (0.6 mg total) by mouth daily. What changed: See the new instructions.   famotidine 20 MG tablet Commonly known as: Pepcid One after supper What changed:  how much to take how to take this when to take this additional instructions   febuxostat 40 MG tablet Commonly known as: ULORIC TAKE 1 TABLET BY MOUTH DAILY   gabapentin 300 MG capsule Commonly  known as: NEURONTIN Take 3 capsules (900 mg total) by mouth 2 (two) times daily.   Glucagon 3 MG/DOSE Powd Place 3 mg into the nose once as needed for up to 1 dose.   HumuLIN R U-500 KwikPen 500 UNIT/ML KwikPen Generic drug: insulin regular human CONCENTRATED Use up to 250-260 units of insulin daily as advised   ketoconazole 2 % cream Commonly known as: NIZORAL Apply 1 Application topically daily.   Lantus SoloStar 100 UNIT/ML Solostar Pen Generic drug: insulin glargine Inject 75 Units into the skin daily.   Meclizine HCl 25 MG Chew Chew 1 tablet by mouth 3 (three) times daily as needed (Dizziness).   metolazone 2.5 MG tablet Commonly known as: ZAROXOLYN TAKE ONE TABLET BY MOUTH ON TUESDAYS AND THURSDAYS 30 MINUTES BEFORE TAKING TORSEMIDE AS DIRECTED   nicotine 21 mg/24hr patch Commonly known as: NICODERM CQ - dosed in mg/24 hours Place 1 patch (21 mg total) onto the skin daily. Start taking on: August 22, 2023   nitroGLYCERIN 0.4 MG SL tablet Commonly known as: NITROSTAT Place 1 tablet (0.4 mg total) under the tongue every 5 (five) minutes x 3 doses as needed for chest pain.   oxyCODONE-acetaminophen 10-325 MG tablet Commonly known as: PERCOCET Take 1 tablet by mouth every 4 (four) hours. What changed: how much to take   potassium chloride SA 20 MEQ tablet Commonly known as: KLOR-CON M Take 1 tablet (20 mEq total) by mouth daily. TAKE AN EXTRA TABLET ON TUESDAY AND THURSDAY   rosuvastatin 20 MG  tablet Commonly known as: CRESTOR Take 20 mg by mouth at bedtime.   tacrolimus 0.1 % ointment Commonly known as: PROTOPIC Apply 1 Application topically 2 (two) times daily as needed (Dermatitis).   tamsulosin 0.4 MG Caps capsule Commonly known as: FLOMAX TAKE 1 CAPSULE BY MOUTH EACH NIGHT AT BEDTIME   torsemide 20 MG tablet Commonly known as: DEMADEX TAKE 2 TABELTS BY MOUTH DAILY IN THE MORNING AND 1 IN THE EVENING   Trelegy Ellipta 100-62.5-25 MCG/ACT Aepb Generic drug: Fluticasone-Umeclidin-Vilant INHALE 1 PUFF BY MOUTH DAILY   triamcinolone cream 0.1 % Commonly known as: KENALOG Apply 1 Application topically daily as needed.   VITAMIN A PO Take 2,400 mcg by mouth daily.   Vitamin D (Ergocalciferol) 1.25 MG (50000 UNIT) Caps capsule Commonly known as: DRISDOL Take 1 capsule (50,000 Units total) by mouth every 7 (seven) days.   warfarin 5 MG tablet Commonly known as: COUMADIN Take as directed. If you are unsure how to take this medication, talk to your nurse or doctor. Original instructions: Take 1/2 to 1 tablet daily or as directed What changed: See the new instructions.       Allergies  Allergen Reactions   Adhesive [Tape]     itching   Latex Itching    When tape is on the skin too long skin gets red & itching      Procedures/Studies: CT Head Wo Contrast Result Date: 08/18/2023 CLINICAL DATA:  Mental status change EXAM: CT HEAD WITHOUT CONTRAST TECHNIQUE: Contiguous axial images were obtained from the base of the skull through the vertex without intravenous contrast. RADIATION DOSE REDUCTION: This exam was performed according to the departmental dose-optimization program which includes automated exposure control, adjustment of the mA and/or kV according to patient size and/or use of iterative reconstruction technique. COMPARISON:  CT brain 10/26/2022 FINDINGS: Brain: No acute territorial infarction, hemorrhage or intracranial mass. The ventricles are nonenlarged.  Vascular: No hyperdense vessels.  No unexpected calcification  Skull: Normal. Negative for fracture or focal lesion. Sinuses/Orbits: Mucosal thickening in the sinuses Other: None IMPRESSION: Negative non contrasted CT appearance of the brain. Electronically Signed   By: Jasmine Pang M.D.   On: 08/18/2023 21:47   DG Chest Port 1 View Result Date: 08/18/2023 CLINICAL DATA:  Weakness EXAM: PORTABLE CHEST 1 VIEW COMPARISON:  06/29/2023 FINDINGS: Cardiomegaly. No acute airspace disease, pleural effusion or pneumothorax. Aortic atherosclerosis IMPRESSION: No active disease. Cardiomegaly. Electronically Signed   By: Jasmine Pang M.D.   On: 08/18/2023 20:29      Subjective: Today patient indicates that he actually sleeps on a recliner and has been doing so for a long time/maybe even a year.  Frustrated by ongoing chronic low back pain, does not like hospital food and insists on discharging home today.  Denies dyspnea, chest pain.  Discharge Exam:  Vitals:   08/21/23 0444 08/21/23 0500 08/21/23 0757 08/21/23 0840  BP: 119/78  136/60   Pulse: 62  67   Resp: 18  18   Temp: 98.5 F (36.9 C)  98.5 F (36.9 C)   TempSrc: Oral  Oral   SpO2: 96%  99% 99%  Weight:  119.5 kg    Height:        General exam: Middle-age male, moderately built and obese sitting up comfortably in reclining chair. Respiratory system: Clear to auscultation.  No increased work of breathing. Cardiovascular system: S1 & S2 heard, RRR. No JVD, murmurs, rubs, gallops or clicks.  Trace bilateral leg edema, much of it appears chronic and brawny edema.  Telemetry personally reviewed: Sinus rhythm in the 60s. Gastrointestinal system: Abdomen is protuberant/obese, soft and nontender. No organomegaly or masses felt. Normal bowel sounds heard. Central nervous system: Alert and oriented x 4. No focal neurological deficits. Extremities: Symmetric 5 x 5 power.  Intermittent tremors of both upper extremities, seem less today. Skin: No  rashes, lesions or ulcers Psychiatry: Judgement and insight appear intact.  Mood & affect appropriate.     The results of significant diagnostics from this hospitalization (including imaging, microbiology, ancillary and laboratory) are listed below for reference.     Microbiology: No results found for this or any previous visit (from the past 240 hours).   Labs: CBC: Recent Labs  Lab 08/18/23 2016 08/19/23 0741 08/20/23 0843 08/21/23 0642  WBC 3.7* 3.4* 5.0 4.2  NEUTROABS  --  2.1  --   --   HGB 9.3* 9.6* 9.7* 9.7*  HCT 29.9* 29.7* 30.9* 30.9*  MCV 95.5 93.7 93.9 95.1  PLT 72* 71* 70* 71*    Basic Metabolic Panel: Recent Labs  Lab 08/18/23 2016 08/19/23 0741 08/20/23 0843 08/21/23 0642  NA 143 145 142 142  K 3.2* 3.8 3.7 3.8  CL 102 105 104 104  CO2 29 31 28 27   GLUCOSE 176* 212* 215* 213*  BUN 38* 36* 35* 34*  CREATININE 2.27* 2.04* 1.56* 1.50*  CALCIUM 8.9 9.3 9.3 9.2  MG  --  2.3  --   --     Liver Function Tests: Recent Labs  Lab 08/18/23 2016 08/19/23 0741 08/21/23 0642  AST 30 31 66*  ALT 18 17 29   ALKPHOS 118 110 138*  BILITOT 0.8 0.8 1.6*  PROT 6.9 7.2 6.9  ALBUMIN 3.2* 3.2* 3.2*    CBG: Recent Labs  Lab 08/20/23 0829 08/20/23 1238 08/20/23 1619 08/20/23 2025 08/21/23 0757  GLUCAP 197* 255* 238* 225* 280*    Hgb A1c Recent Labs  08/19/23 0740  HGBA1C 9.4*     Thyroid function studies Recent Labs    08/19/23 0741  TSH 2.965    Anemia work up Recent Labs    08/19/23 0741  [id number] 1,043*   Discussed in detail with patient's spouse via phone, updated care and answered all questions.   Time coordinating discharge: 50 minutes  SIGNED:  Marcellus Scott, MD,  FACP, [facility], [facility], [facility]   [facility] Physician Advisor [place]     To contact the attending provider between 7A-7P or the covering provider during after hours 7P-7A, please log into the web site [email or web address] and access using  universal [place] password for that web site. If you do not have the password, please call the hospital operator.

## 2023-08-21 NOTE — Care Management Important Message (Signed)
 Important Message  Patient Details  Name: [name] MRN: [id number] Date of Birth: 04-23-1956   Important Message Given:  Yes - Medicare IM     Dorena Bodo 08/21/2023, 3:07 PM

## 2023-08-21 NOTE — Discharge Instructions (Addendum)

## 2023-08-21 NOTE — Progress Notes (Signed)
 Heart Failure Navigator Progress Note  Assessed for Heart & Vascular TOC clinic readiness.  Patient discharging today, EF 65-70% in 2023, repeat pending. Will follow up with [facility] in 1 week. Will not schedule TOC at this time.   Navigator available for reassessment of patient.   Sharen Hones, PharmD, BCPS Heart Failure Stewardship Pharmacist Phone [phone]

## 2023-08-21 NOTE — Progress Notes (Signed)
   08/21/23 1136  Spiritual Encounters  Type of Visit Initial  Care provided to: Pt and family  Reason for visit Advance directives  OnCall Visit No   Chaplain provided AD education and documentation to Patient and his wife. Patient indicated that he may have a completed AD doc at home. Per discussion with Patient and his wife, there is no additional spiritual care need at this time.  Chaplain Daron Offer

## 2023-08-21 NOTE — TOC Transition Note (Signed)
 Transition of Care [facility]) - Discharge Note   Patient Details  Name: [name] MRN: [id number] Date of Birth: 02/21/1956  Transition of Care [facility]) CM/SW Contact:  Janae Bridgeman, RN Phone Number: 08/21/2023, 11:36 AM   Clinical Narrative:    CM met with the patient and sister at the bedside to discuss TOC needs.  The patient plans to return home today with family.  Patient has power chair and rolator at home.  Patient was offered Medicare choice regarding home health services and the patient would like to have [facility] resume services.  I called Lynette, CM and she accepted for home health services.  HH orders are in place.  The patient's sister was requesting Meals on Wheels.  I provided the sister with number to call to place the patient on the waiting list.  No other TOC needs.   Final next level of care: Home w Home Health Services Barriers to Discharge: No Barriers Identified   Patient Goals and CMS Choice Patient states their goals for this hospitalization and ongoing recovery are:: To return home CMS Medicare.gov Compare Post Acute Care list provided to:: Patient Choice offered to / list presented to : Patient [place] ownership interest in [facility].provided to:: Patient    Discharge Placement                       Discharge Plan and Services Additional resources added to the After Visit Summary for     Discharge Planning Services: CM Consult Post Acute Care Choice: Home Health                    HH Arranged: PT, OT [facility] Agency:  [facility] Home health) Date [facility] Agency Contacted: 08/21/23 Time HH Agency Contacted: 1133 Representative spoke with at [facility] Agency: Haywood Lasso, CM with [facility] accepted for [facility] PT/OT  Social Drivers of Health (SDOH) Interventions SDOH Screenings   Housing: Low Risk  (08/19/2023)  Utilities: Not At Risk (08/19/2023)  Depression (PHQ2-9): High Risk (10/24/2022)  Social Connections: Unknown (04/05/2022)    Received from [facility]  Tobacco Use: High Risk (08/18/2023)     Readmission Risk Interventions    08/21/2023   11:36 AM  Readmission Risk Prevention Plan  Transportation Screening Complete  PCP or Specialist Appt within 3-5 Days Complete  HRI or Home Care Consult Complete  Social Work Consult for Recovery Care Planning/Counseling Complete  Palliative Care Screening Complete  Medication Review Oceanographer) Complete

## 2023-08-21 NOTE — Progress Notes (Signed)
 DISCHARGE NOTE HOME [name] to be discharged Home per MD order. Discussed prescriptions and follow up appointments with the patient. Prescriptions given to patient; medication list explained in detail. Patient verbalized understanding.  Skin clean, dry and intact without evidence of skin break down, no evidence of skin tears noted. IV catheter discontinued intact. Site without signs and symptoms of complications. Dressing and pressure applied. Pt denies pain at the site currently. No complaints noted.  See LDA left foot Toe.  An After Visit Summary (AVS) was printed and given to the patient. Patient escorted via wheelchair, and discharged home via private auto.  Velia Meyer, RN

## 2023-08-21 NOTE — Progress Notes (Signed)
 PHARMACY - ANTICOAGULATION CONSULT NOTE  Pharmacy Consult for warfarin Indication: atrial fibrillation  Allergies  Allergen Reactions   Adhesive [Tape]     itching   Latex Itching    When tape is on the skin too long skin gets red & itching    Patient Measurements: Height: 6\' 2"  (188 cm) Weight: 119.5 kg (263 lb 7.2 oz) IBW/kg (Calculated) : 82.2  Vital Signs: Temp: 98.5 F (36.9 C) (04/07 0444) Temp Source: Oral (04/07 0444) BP: 119/78 (04/07 0444) Pulse Rate: 62 (04/07 0444)  Labs: Recent Labs    08/19/23 0741 08/20/23 0843 08/21/23 0642  HGB 9.6* 9.7* 9.7*  HCT 29.7* 30.9* 30.9*  PLT 71* 70* 71*  LABPROT 19.3* 18.2* 20.1*  INR 1.6* 1.5* 1.7*  CREATININE 2.04* 1.56* 1.50*    Estimated Creatinine Clearance: 67.4 mL/min (A) (by C-G formula based on SCr of 1.5 mg/dL (H)).   Medical History: Past Medical History:  Diagnosis Date   Actinic keratosis    Arthritis    lower back   Asthma    Atrial fibrillation [facility])    Atrial flutter (HCC)    s/p CTI ablation by Dr Johney Frame   Brain aneurysm 2009   questionable. A follow up CTA in 2009 showed no evidence of   Chronic back pain    DDD/stenosis   Colon polyps    9 polyps removed 10/13/11   Complication of anesthesia 09/11/2012   slow to awaken after ablation   COPD (chronic obstructive pulmonary disease) (HCC)    Diabetes mellitus    takes Metformin and Glimepiride daily   Emphysema    GERD (gastroesophageal reflux disease)    takes Omeprazole bid   Heart failure (HCC)    History of shingles    HLD (hyperlipidemia)    takes Pravastatin daily   HTN (hypertension)    takes Prinizide daily   Obesity    OSA (obstructive sleep apnea)    not always using cpap   Overdose 2009   unintentional Flecanide overdose   Peripheral neuropathy    Short-term memory loss    Tobacco abuse     Medications:  Scheduled:   bisoprolol  10 mg Oral Daily   colchicine  0.6 mg Oral Daily   febuxostat  40 mg Oral Daily    fluticasone furoate-vilanterol  1 puff Inhalation Daily   And   umeclidinium bromide  1 puff Inhalation Daily   gabapentin  300 mg Oral BID   insulin aspart  0-15 Units Subcutaneous TID WC   insulin aspart  0-5 Units Subcutaneous QHS   insulin aspart  3 Units Subcutaneous TID WC   insulin glargine-yfgn  40 Units Subcutaneous Daily   nicotine  21 mg Transdermal Daily   pantoprazole  40 mg Oral Daily   rosuvastatin  20 mg Oral Daily   sodium chloride flush  3 mL Intravenous Q12H   sucralfate  1 g Oral TID WC & HS   tamsulosin  0.4 mg Oral QPC supper   Warfarin - Pharmacist Dosing Inpatient   Does not apply q1600   Infusions:   Assessment: 68 yo male on warfarin PTA for atrial flutter. Pharmacy consulted to restart warfarin therapy.   Per anticoag visit note on 07/18/2023, home dose is 5 mg on Fri and 2.5 mg all other days, confrimed with pt. Last dose PTA was on 4/3.   4/7 AM: INR today is 1.7- up from 1.5 yesterday, pt is eating 100% of meals. CBC is stable  and no signs of bleeding noted.   Goal of Therapy:  INR 2-3 Monitor platelets by anticoagulation protocol: Yes   Plan:  Warfarin 2.5 mg today per PTA regimen Daily INR and CBC Monitor for s/sx of bleeding  Jani Gravel, PharmD Clinical Pharmacist  08/21/2023 8:00 AM

## 2023-08-21 NOTE — Care Management Important Message (Signed)
 Important Message  Patient Details  Name: [name] MRN: [id number] Date of Birth: [birth year]   Important Message Given:  Yes - Medicare IM CORRECTION   Patient left prior to IM delivery will mail a copy to the patient home address.    Savva Beamer 08/21/2023, 4:06 PM

## 2023-08-22 ENCOUNTER — Ambulatory Visit: Admitting: Internal Medicine

## 2023-08-22 ENCOUNTER — Ambulatory Visit: Payer: Medicare Other | Admitting: Internal Medicine
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
## 2023-08-25 ENCOUNTER — Encounter (HOSPITAL_BASED_OUTPATIENT_CLINIC_OR_DEPARTMENT_OTHER): Payer: Self-pay

## 2023-08-28 ENCOUNTER — Telehealth: Payer: Self-pay | Admitting: Neurology

## 2023-08-28 NOTE — Telephone Encounter (Signed)
 Caller stated he would like to speak with nurse about some concerns he's having. Stated he was just released form hospital last week and his head has not been feeling right

## 2023-08-28 NOTE — Telephone Encounter (Signed)
 Called and spoke with wife and reported Dr. Denney Fisherman message. She understood and will let him know. He has an appointment on Wednesday with PCP.

## 2023-08-29 ENCOUNTER — Telehealth: Payer: Self-pay | Admitting: Physician Assistant

## 2023-08-29 ENCOUNTER — Ambulatory Visit: Attending: Physician Assistant | Admitting: Physician Assistant

## 2023-08-29 ENCOUNTER — Encounter: Payer: Self-pay | Admitting: Physician Assistant

## 2023-08-29 VITALS — BP 128/62 | HR 61 | Ht 74.0 in | Wt 255.0 lb

## 2023-08-29 DIAGNOSIS — I480 Paroxysmal atrial fibrillation: Secondary | ICD-10-CM

## 2023-08-29 DIAGNOSIS — N179 Acute kidney failure, unspecified: Secondary | ICD-10-CM

## 2023-08-29 DIAGNOSIS — E119 Type 2 diabetes mellitus without complications: Secondary | ICD-10-CM

## 2023-08-29 DIAGNOSIS — E785 Hyperlipidemia, unspecified: Secondary | ICD-10-CM

## 2023-08-29 DIAGNOSIS — K7460 Unspecified cirrhosis of liver: Secondary | ICD-10-CM

## 2023-08-29 DIAGNOSIS — I10 Essential (primary) hypertension: Secondary | ICD-10-CM | POA: Diagnosis not present

## 2023-08-29 DIAGNOSIS — Z79899 Other long term (current) drug therapy: Secondary | ICD-10-CM

## 2023-08-29 MED ORDER — SPIRONOLACTONE 25 MG PO TABS: 12.5000 mg | ORAL_TABLET | Freq: Every day | ORAL | 3 refills | Status: AC

## 2023-08-29 MED ORDER — METOLAZONE 2.5 MG PO TABS: 2.5000 mg | ORAL_TABLET | ORAL | 2 refills | Status: DC | PRN

## 2023-08-29 NOTE — Telephone Encounter (Signed)
 Spoke with [name] regarding medication instructions, [name] states he is unclear as to what he is supposed to be taking. Per [name]'s note, losartan will be taken off medication list because [name] hasn't been taking medication since discharge from the hospital. Beverely Buba also advised [name] to only use metolazone as needed and added spironolactone 12.5mg  daily for extra diuresis. [name] states these instructions were not made clear to him before he left. [name] verbalizes understanding.

## 2023-08-29 NOTE — Telephone Encounter (Signed)
 Pt c/o medication issue:  1. Name of Medication:   2. How are you currently taking this medication (dosage and times per day)?   3. Are you having a reaction (difficulty breathing--STAT)?   4. What is your medication issue? Patient said he saw [name] today. He said [name] told him to stop one of his medicine. Patient wants to know the name of the medicine please.

## 2023-08-29 NOTE — Progress Notes (Unsigned)
 Cardiology Office Note:  .   Date:  08/29/2023  ID:  [name], DOB [birth year], MRN [id number] PCP: Audie Pinto, FNP   HeartCare Providers Cardiologist:  Rollene Rotunda, MD { Click to update primary MD,subspecialty MD or APP then REFRESH:1}   History of Present Illness: .   [name] is a 68 y.o. male a hx of COPD/emphysema, HTN, HLD, DM II, obesity, OSA intolerant of CPAP, history of tobacco abuse and history of atrial fibrillation and atrial flutter. Patient had atrial flutter ablation in 2014. Since he has both paroxysmal atrial fibrillation and atrial flutter, he underwent EP study with a second ablation on 04/21/2015. Cardiac catheterization in May 2018 showed widely patent coronary arteries with normal EF, moderate LVH. Echocardiogram obtained on 08/14/2019 showed EF 60 to 65%, moderate LVH, grade 2 DD, moderately elevated pulmonary artery systolic pressure, RVSP 52.6 mmHg, severe left atrial enlargement, moderate right atrial enlargement, mild MR, mild to moderate TR. Carotid Doppler obtained on 09/30/2019 showed 1 to 39% disease in bilateral internal carotid artery. Cardiac MRI performed on 12/16/2019 showed EF 66%, moderate concentric LVH, no myocardial LGE to suggest prior MI, infiltrative cardiac disease or myocarditis. No evidence of cardiac amyloidosis.  He unfortunately had a quite significant leg pain after cardiac MRI.  I saw the patient in September 2021, I felt his pain is most consistent with myositis versus compressed nerve in the lower back.  Total CK was negative which argues against myositis.  Lower extremity arterial Doppler obtained on 02/12/2020 showed normal ABI and triphasic blood flow throughout both legs, no vascular blockage was seen to explain his leg discomfort.  Venous Doppler obtained on 04/29/2021 was negative for DVT.  Due to dizziness, I advised 2-week heart monitor in February 2023 which came back reassuring with only occasional SVT in the junctional  rhythm but nothing to explain the presyncope.  Repeat echocardiogram obtained on 07/09/2021 showed EF 65 to 70%, moderate LVH, RVSP 33.5 mmHg, trivial MR, moderate to severe mitral annular calcification, no evidence of mitral stenosis, mild to moderate TR, mild to moderate aortic stenosis.  Patient was last seen by Dr. Antoine Poche in November 2024 at which time he was stable.  Creatinine was mildly elevated that day at 1.4, however this is not that far off from his baseline of 1.2-1.3.  12-month follow-up was recommended.  Echocardiogram was ordered for LVH but not done.  He was seen in the ED in February 2025 due to abdominal pain.  CT of abdomen and pelvis with contrast showed liver cirrhosis with evidence of portal hypertension including splenomegaly and increased collateral vessels, vague geographic hypodensity most common spicules in the left hepatic lobe, recommend nonemergent MRI.  BNP was borderline elevated at 104 at the time.  He was readmitted to the hospital/08/2023 with confusion after being found less responsive at home and is sitting on a chair in his backyard.  He could not recall detailed event.  Blood pressure was low in the 50s.  It was suspected his acute metabolic encephalopathy was due to polypharmacy complicated by dehydration and hypotension.  Creatinine on arrival was 2.27, baseline was 1.2-1.5.  He received IV fluid.  Creatinine improved to 1.56 after the IV hydration.  Demadex, Zaroxolyn and ARB were held.  Demadex and the Zaroxolyn were resumed prior to discharge, however ARB continued to be held.  He was also given IV Solu-Medrol for COPD exacerbation.  Hemoglobin A1c in the hospital was 9.4 indicating poorly controlled diabetes.  Echocardiogram obtained on/11/2023 showed EF 65 to 70%, mild LVH, normal RV, RVSP 54.8 mmHg, mild MR, mild aortic stenosis.  Patient presents today for follow-up.  He has not taken his losartan since discharge, I his blood pressure is normal, I will remove the  losartan from his medication list.  I think metolazone may be too harsh on him, I will change the metolazone to only as needed.  I will add spironolactone 12.5 mg daily for extra diuresis now he is off of metolazone, this is also because he has underlying cirrhosis.  He will need a basic metabolic panel in 1 to 2 weeks.  He denies any major chest pain or shortness of breath.  He says his head is not completely clear at this time.  He has followed up with his GI doctor as outpatient who has ordered a MRI of the liver, however the study was canceled in February, he has rescheduled it with [facility] as he need a open MRI machine due to claustrophobia.  He can follow-up with Dr. Lavonne Prairie in 3 to 4 months.  ROS: ***  Studies Reviewed: .        *** Risk Assessment/Calculations:   {Does this patient have ATRIAL FIBRILLATION?:[id number]}         Physical Exam:   VS:  BP 128/62 (BP Location: Left Arm, Patient Position: Sitting, Cuff Size: Normal)   Pulse 61   Ht 6\' 2"  (1.88 m)   Wt 255 lb (115.7 kg)   SpO2 94%   BMI 32.74 kg/m    Wt Readings from Last 3 Encounters:  08/29/23 255 lb (115.7 kg)  08/21/23 263 lb 7.2 oz (119.5 kg)  07/13/23 261 lb (118.4 kg)    GEN: Well nourished, well developed in no acute distress NECK: No JVD; No carotid bruits CARDIAC: ***RRR, no murmurs, rubs, gallops RESPIRATORY:  Clear to auscultation without rales, wheezing or rhonchi  ABDOMEN: Soft, non-tender, non-distended EXTREMITIES:  No edema; No deformity   ASSESSMENT AND PLAN: .   ***    {Are you ordering a CV Procedure (e.g. stress test, cath, DCCV, TEE, etc)?   Press F2        :[id number]}  Dispo: ***  Signed, Ervin Heath, PA

## 2023-08-29 NOTE — Patient Instructions (Signed)
 Medication Instructions:  TAKE METOLAZONE AS NEEDED  START SPIRONOLACTONE 12.5 MG DAILY  *If you need a refill on your cardiac medications before your next appointment, please call your pharmacy*  Lab Work: BMP IN 1-2 WEEKS If you have labs (blood work) drawn today and your tests are completely normal, you will receive your results only by: MyChart Message (if you have MyChart) OR A paper copy in the mail If you have any lab test that is abnormal or we need to change your treatment, we will call you to review the results.  Testing/Procedures: NO TESTING  Follow-Up: At [facility], you and your health needs are our priority.  As part of our continuing mission to provide you with exceptional heart care, our providers are all part of one team.  This team includes your primary Cardiologist (physician) and Advanced Practice Providers or APPs (Physician Assistants and Nurse Practitioners) who all work together to provide you with the care you need, when you need it.  Your next appointment:   3-4 month(s)  Provider:   Eilleen Grates, MD   Other Instructions   1st Floor: - Lobby - Registration  - Pharmacy  - Lab - Cafe  2nd Floor: - PV Lab - Diagnostic Testing (echo, CT, nuclear med)  3rd Floor: - Vacant  4th Floor: - TCTS (cardiothoracic surgery) - AFib Clinic - Structural Heart Clinic - Vascular Surgery  - Vascular Ultrasound  5th Floor: - HeartCare Cardiology (general and EP) - Clinical Pharmacy for coumadin, hypertension, lipid, weight-loss medications, and med management appointments    Valet parking services will be available as well.

## 2023-08-31 ENCOUNTER — Ambulatory Visit: Admitting: Internal Medicine

## 2023-08-31 NOTE — Progress Notes (Deleted)
 Patient ID: [name], male   DOB: January 12, 1956, 68 y.o.   MRN: [id number] This note was precharted 08/22/2023.  HPI: [name] is a 68 y.o.-year-old male, returning for follow-up for DM2, dx in 2012, insulin-dependent since 2019, uncontrolled, with complications (CHF, CAD, Afib/flutter, PAD, CKD, PN). Pt. previously saw Dr. Washington Hacker, but last visit with me 1.5 months ago.  Interim history: No increased urination, nausea, chest pain.  He has blurry vision after he burned his eyes with carburetor fluid 11/2022.   He continues to have weakness, muscle and joint pain.  She sees the pain clinic.   He was recently admitted (08/18/2023) for confusion due to increased ammonia in the setting of alcoholic cirrhosis.  His BNP was also elevated at 688.  At that time, HbA1c was checked and it was higher, at 9.4%.  Kidney function was down so he was advised to hold Cozaar. He he continues to drink sodas, now 8 ounces a day, previously twice a day.  Reviewed HbA1c: Lab Results  Component Value Date   HGBA1C 9.4 (H) 08/19/2023   HGBA1C 8.9 (A) 05/04/2023   HGBA1C 8.7 03/20/2023   HGBA1C 7.8 11/16/2022   HGBA1C 7.6 (A) 06/28/2022   HGBA1C 7.6 (A) 05/11/2022   HGBA1C 7.7 (A) 11/30/2021   HGBA1C 7.6 (A) 07/29/2021   HGBA1C 8.1 (A) 05/26/2021   HGBA1C 7.6 (A) 11/09/2020  06/06/2023: HbA1c 8.8%  Previously on: - Trulicity 3 mg weekly - Humalog 0-60 units 1-5x a day, before meals - max amount 270 units/day (!) Per review of Dr. Jacqualyn Mates notes, time-released insulins caused a rash in the past.  Patient does not remember this at today's visit. He tried [place] but this caused dehydration. Metformin dose is limited by abdominal pain. Trulicity dose is limited by nausea. In the past, he has been missing insulin 2 to 3 days at that time per review of Dr. Jacqualyn Mates notes.  Then on: - Trulicity 3 mg weekly - NovoLog >> Aspart 40-50 units 3x a day, before meals - Basaglar 70 >> 75 units at  bedtime  Prev. on: - Trulicity 3 mg weekly - U500 insulin - missing doses, takes it mostly after the meals, or misses 120-140 >> actually taking 60-80  units of insulin in the morning   70-100 >> 90-110 >> actually taking 40-60 units in the evening  Then on: - U500 insulin - 30 min before meals 100-120 units in the morning  100-120 units in the evening He was not able to start Basaglar 50 units daily at bedtime as this is not covered for him.  We changed to: - Ozempic 0.25 >> 0.5 mg weekly >> off >> restarted 1 mg weekly - U500 insulin - 30 min before meals 100-120 >> 100-140 >> 150 >> 180-200 units in the morning  100-120 >> 100-140 >> 130 >> 180-200units in the evening - Lantus 50 >> 75 units daily at bedtime  Pt checks his sugars >4x a day with his Dexcom CGM:  Prev.:  Prev.:  Prev.:   Lowest sugar was 70s-80s >> 50s >> 90; he has hypoglycemia awareness at 110.  Highest sugar was 600 ...>> 300s >> 300s (500s fingerprick).  Glucometer: ReliOn  Pt's meals are: - Breakfast: sausage biscuit - Lunch: banana sandwich, leftovers - Dinner: meat and veggies, seldom cake - Snacks: occas. - pasta, etc.  - + CKD, last BUN/creatinine:  Lab Results  Component Value Date   BUN 34 (H) 08/21/2023   BUN 35 (H)  08/20/2023   CREATININE 1.50 (H) 08/21/2023   CREATININE 1.56 (H) 08/20/2023   Lab Results  Component Value Date   MICRALBCREAT >300 08/01/2022  On diltiazem, Cozaar 50 mg daily >> holding this now due to low kidney function.  -+ HL; last set of lipids:  Lab Results  Component Value Date   CHOL 154 08/01/2022   HDL 23 (L) 08/01/2022   LDLCALC 63 08/01/2022   LDLDIRECT 99.9 09/08/2011   TRIG 437 (H) 08/01/2022   CHOLHDL 6.7 (H) 08/01/2022  Off pravastatin 80 mg daily - stopped 2/2 mm weakness.  He was started on Crestor 10 mg daily 10/2022.  - last eye exam was in 2024. Reportedly No DR. Incipient cataracts.  - + numbness and tingling in his feet. Also,  significant pain.  Last foot exam 11/25/2022.  On Neurontin 300 mg 3 capsules twice daily. He sees pain management. He sees podiatry.  Latest TSH 5.159 on 12/22/2022. He also has a history of HTN, OSA, COPD, GERD, DDD, gout. He is still smoking.  ROS: + see HPI  Past Medical History:  Diagnosis Date   Actinic keratosis    Arthritis    lower back   Asthma    Atrial fibrillation [facility])    Atrial flutter (HCC)    s/p CTI ablation by Dr Nunzio Belch   Brain aneurysm 2009   questionable. A follow up CTA in 2009 showed no evidence of   Chronic back pain    DDD/stenosis   Colon polyps    9 polyps removed 10/13/11   Complication of anesthesia 09/11/2012   slow to awaken after ablation   COPD (chronic obstructive pulmonary disease) (HCC)    Diabetes mellitus    takes Metformin and Glimepiride daily   Emphysema    GERD (gastroesophageal reflux disease)    takes Omeprazole bid   Heart failure (HCC)    History of shingles    HLD (hyperlipidemia)    takes Pravastatin daily   HTN (hypertension)    takes Prinizide daily   Obesity    OSA (obstructive sleep apnea)    not always using cpap   Overdose 2009   unintentional Flecanide overdose   Peripheral neuropathy    Short-term memory loss    Tobacco abuse    Past Surgical History:  Procedure Laterality Date   ANTERIOR CERVICAL DECOMP/DISCECTOMY FUSION  01/04/2012   Procedure: ANTERIOR CERVICAL DECOMPRESSION/DISCECTOMY FUSION 1 LEVEL/HARDWARE REMOVAL;  Surgeon: Elder Greening, MD;  Location: [facility] NEURO ORS;  Service: Neurosurgery;  Laterality: N/A;  explore cervical fusion Cervical six - seven  with removal of codman plate anterior cervical decompression with fusion interbody prothesis plating and bonegraft   APPENDECTOMY  2-51yrs ago   ATRIAL FIBRILLATION ABLATION N/A 09/11/2012   PT DID NOT HAVE AN ATRIAL FIBRILLATION ABLATION IN 2014!  ATRIAL FLUTTER ABLATION ONLY   ATRIAL FLUTTER ABLATION  09/11/2012   CTI ablation by Dr Nunzio Belch   BIOPSY   11/07/2019   Procedure: BIOPSY;  Surgeon: Janel Medford, MD;  Location: [facility] ENDOSCOPY;  Service: Endoscopy;;   CARDIAC CATHETERIZATION  2008   no significant CAD   CARDIOVERSION  05/05/2011   Procedure: CARDIOVERSION;  Surgeon: Lenise Quince, MD;  Location: [facility] ENDOSCOPY;  Service: Cardiovascular;  Laterality: N/A;   CARDIOVERSION Bilateral 07/26/2012   Procedure: CARDIOVERSION;  Surgeon: Eilleen Grates, MD;  Location: [facility] ENDOSCOPY;  Service: Cardiovascular;  Laterality: Bilateral;   CARPAL TUNNEL RELEASE  99/2000   bilateral   COLONOSCOPY WITH PROPOFOL  N/A 12/13/2012   Procedure: COLONOSCOPY WITH PROPOFOL;  Surgeon: Rachael Fee, MD;  Location: [facility] ENDOSCOPY;  Service: Endoscopy;  Laterality: N/A;   ELECTROPHYSIOLOGIC STUDY N/A 04/21/2015   Procedure: Atrial Fibrillation Ablation;  Surgeon: Hillis Range, MD;  Location: [facility] INVASIVE CV LAB;  Service: Cardiovascular;  Laterality: N/A;   ESOPHAGOGASTRODUODENOSCOPY (EGD) WITH PROPOFOL N/A 11/07/2019   Procedure: ESOPHAGOGASTRODUODENOSCOPY (EGD) WITH PROPOFOL;  Surgeon: Rachael Fee, MD;  Location: [facility] ENDOSCOPY;  Service: Endoscopy;  Laterality: N/A;   HERNIA REPAIR     KNEE ARTHROSCOPY WITH MEDIAL MENISECTOMY Left 04/23/2021   Procedure: LEFT KNEE ARTHROSCOPY WITH PARTIAL MEDIAL MENISCECTOMY;  Surgeon: Eldred Manges, MD;  Location: [facility] OR;  Service: Orthopedics;  Laterality: Left;   KNEE SURGERY  6-64yrs ago   left   LEFT HEART CATH AND CORONARY ANGIOGRAPHY N/A 09/19/2016   Procedure: Left Heart Cath and Coronary Angiography;  Surgeon: Lyn Records, MD;  Location: [facility] INVASIVE CV LAB;  Service: Cardiovascular;  Laterality: N/A;   Left inguinal hernia repair     as a child   NASAL SEPTOPLASTY W/ TURBINOPLASTY Bilateral 02/19/2014   Procedure: NASAL SEPTOPLASTY WITH BILATERAL TURBINATE REDUCTION;  Surgeon: Flo Shanks, MD;  Location: [facility] OR;  Service: ENT;  Laterality: Bilateral;   NECK SURGERY  33yrs ago   right shoulder surgery  4-20yrs ago    cyst removed   TEE WITHOUT CARDIOVERSION  05/05/2011   Procedure: TRANSESOPHAGEAL ECHOCARDIOGRAM (TEE);  Surgeon: Lewayne Bunting, MD;  Location: [facility] ENDOSCOPY;  Service: Cardiovascular;  Laterality: N/A;   TEE WITHOUT CARDIOVERSION N/A 04/21/2015   Procedure: TRANSESOPHAGEAL ECHOCARDIOGRAM (TEE);  Surgeon: Quintella Reichert, MD;  Location: [facility] ENDOSCOPY;  Service: Cardiovascular;  Laterality: N/A;   THROAT SURGERY  4-63yrs ago   "thought " it was cancer but came back not   TONSILLECTOMY     Social History   Socioeconomic History   Marital status: Married    Spouse name: Not on file   Number of children: 2   Years of education: Not on file   Highest education level: Not on file  Occupational History   Occupation: Curator   Tobacco Use   Smoking status: Every Day    Current packs/day: 1.00    Average packs/day: 1 pack/day for 49.0 years (49.0 ttl pk-yrs)    Types: Cigarettes, E-cigarettes   Smokeless tobacco: Never   Tobacco comments:    Smoking almost a pack per day Patient is vaping daily.  09/28/2022 am a pack of cigs in 4days 01/05/23  Vaping Use   Vaping status: Every Day  Substance and Sexual Activity   Alcohol use: Not Currently    Alcohol/week: 0.0 standard drinks of alcohol    Comment: 12/05/17 pt stated he has drinked 1/2 drink in 3 years   Drug use: No   Sexual activity: Yes  Other Topics Concern   Not on file  Social History Narrative   Daily caffeine(Mountain Dew) Lives in [place] with spouse.Unemployed due to chronic back/ leg pain         Are you right handed or left handed? Left Handed    Are you currently employed ? No    What is your current occupation? No   Do you live at home alone? no   Who lives with you? Lives with wife and 3 doggies   What type of home do you live in: 1 story or 2 story? Lives in one story home  Social Drivers of Corporate investment banker Strain: Not on file  Food Insecurity: Not on file  Transportation Needs: Not  on file  Physical Activity: Not on file  Stress: Not on file  Social Connections: Unknown (04/05/2022)   [facility]   Social Network    Social Network: Not on file  Intimate Partner Violence: Unknown (04/05/2022)   Received from [facility]   HITS    Physically Hurt: Not on file    Insult or Talk Down To: Not on file    Threaten Physical Harm: Not on file    Scream or Curse: Not on file   Current Outpatient Medications on File Prior to Visit  Medication Sig Dispense Refill   albuterol (PROVENTIL) (2.5 MG/3ML) 0.083% nebulizer solution Take 3 mLs (2.5 mg total) by nebulization every 6 (six) hours as needed for wheezing or shortness of breath. 75 mL 5   albuterol (VENTOLIN HFA) 108 (90 Base) MCG/ACT inhaler INHALE 1-2 PUFFS INTO THE LUNGS EVERY 6 HOURS AS NEEDED FOR WHEEZING OR SHORTNESS OF BREATH 8.5 g 5   b complex vitamins tablet Take 1 tablet by mouth every Monday, Wednesday, and Friday.     bisoprolol (ZEBETA) 10 MG tablet Take 1 tablet (10 mg total) by mouth daily. 90 tablet 1   clobetasol cream (TEMOVATE) 0.05 % Apply 1 Application topically 2 (two) times daily as needed (Dermatitis).     clotrimazole-betamethasone (LOTRISONE) cream Apply 1 Application topically daily as needed (Fungus).     colchicine 0.6 MG tablet Take 1 tablet (0.6 mg total) by mouth daily.     famotidine (PEPCID) 20 MG tablet One after supper (Patient taking differently: Take 40 mg by mouth 2 (two) times daily.) 30 tablet 11   febuxostat (ULORIC) 40 MG tablet TAKE 1 TABLET BY MOUTH DAILY 30 tablet 3   Fluticasone-Umeclidin-Vilant (TRELEGY ELLIPTA) 100-62.5-25 MCG/ACT AEPB INHALE 1 PUFF BY MOUTH DAILY 60 each 5   gabapentin (NEURONTIN) 300 MG capsule Take 3 capsules (900 mg total) by mouth 2 (two) times daily.     Glucagon 3 MG/DOSE POWD Place 3 mg into the nose once as needed for up to 1 dose. 1 each 11   insulin glargine (LANTUS SOLOSTAR) 100 UNIT/ML Solostar Pen Inject 75 Units into the skin  daily. 30 mL 3   insulin regular human CONCENTRATED (HUMULIN R U-500 KWIKPEN) 500 UNIT/ML KwikPen Use up to 250-260 units of insulin daily as advised 60 mL 2   ketoconazole (NIZORAL) 2 % cream Apply 1 Application topically daily. 60 g 2   Meclizine HCl 25 MG CHEW Chew 1 tablet by mouth 3 (three) times daily as needed (Dizziness).     metolazone (ZAROXOLYN) 2.5 MG tablet Take 1 tablet (2.5 mg total) by mouth as needed. 8 tablet 2   nicotine (NICODERM CQ - DOSED IN MG/24 HOURS) 21 mg/24hr patch Place 1 patch (21 mg total) onto the skin daily. 28 patch 0   nitroGLYCERIN (NITROSTAT) 0.4 MG SL tablet Place 1 tablet (0.4 mg total) under the tongue every 5 (five) minutes x 3 doses as needed for chest pain. 25 tablet 3   oxyCODONE-acetaminophen (PERCOCET) 10-325 MG tablet Take 1 tablet by mouth every 4 (four) hours.     potassium chloride SA (KLOR-CON M) 20 MEQ tablet Take 1 tablet (20 mEq total) by mouth daily. TAKE AN EXTRA TABLET ON TUESDAY AND THURSDAY 108 tablet 2   rosuvastatin (CRESTOR) 20 MG tablet Take 20 mg by mouth  at bedtime.     spironolactone (ALDACTONE) 25 MG tablet Take 0.5 tablets (12.5 mg total) by mouth daily. 45 tablet 3   tacrolimus (PROTOPIC) 0.1 % ointment Apply 1 Application topically 2 (two) times daily as needed (Dermatitis).     tamsulosin (FLOMAX) 0.4 MG CAPS capsule TAKE 1 CAPSULE BY MOUTH EACH NIGHT AT BEDTIME 30 capsule 11   torsemide (DEMADEX) 20 MG tablet TAKE 2 TABELTS BY MOUTH DAILY IN THE MORNING AND 1 IN THE EVENING 270 tablet 3   triamcinolone cream (KENALOG) 0.1 % Apply 1 Application topically daily as needed. 453 g 2   VITAMIN A PO Take 2,400 mcg by mouth daily.      Vitamin D, Ergocalciferol, (DRISDOL) 1.25 MG (50000 UNIT) CAPS capsule Take 1 capsule (50,000 Units total) by mouth every 7 (seven) days. 12 capsule 3   warfarin (COUMADIN) 5 MG tablet Take 1/2 to 1 tablet daily or as directed (Patient taking differently: Take 2.5-5 mg by mouth daily with supper. Take  2.5mg  (1/2 tablet) by mouth daily, except on Friday's, take 5mg  (1 tablet).) 70 tablet 1   No current facility-administered medications on file prior to visit.   Allergies  Allergen Reactions   Adhesive [Tape]     itching   Latex Itching    When tape is on the skin too long skin gets red & itching   Family History  Problem Relation Age of Onset   Diabetes Mother    Heart disease Father    Lung cancer Father    Diabetes Maternal Grandmother    Colon cancer Neg Hx    Anesthesia problems Neg Hx    Hypotension Neg Hx    Malignant hyperthermia Neg Hx    Pseudochol deficiency Neg Hx    Esophageal cancer Neg Hx    Pancreatic cancer Neg Hx    Stomach cancer Neg Hx    PE: There were no vitals taken for this visit. Wt Readings from Last 10 Encounters:  08/29/23 255 lb (115.7 kg)  08/21/23 263 lb 7.2 oz (119.5 kg)  07/13/23 261 lb (118.4 kg)  06/29/23 259 lb 9.6 oz (117.8 kg)  06/20/23 250 lb (113.4 kg)  05/04/23 263 lb (119.3 kg)  03/29/23 264 lb 3.2 oz (119.8 kg)  03/20/23 261 lb 9.6 oz (118.7 kg)  01/05/23 236 lb (107 kg)  11/16/22 255 lb 6.4 oz (115.8 kg)   Constitutional: overweight, in NAD, in wheelchair Eyes: no exophthalmos ENT: no thyromegaly, no cervical lymphadenopathy Cardiovascular: RRR, No MRG Respiratory:  + wheezing B lungs Musculoskeletal: no deformities Skin: + rash B shins - ?NLD; also cigarette burns on bilateral legs as he dropped his cigarette while sleeping Neurological: +  tremor with outstretched hands  ASSESSMENT: 1. DM2, insulin-dependent, uncontrolled, with complications - CHF - A fib/flutter - PAD - CKD stage III - PN - CAD  2. HL - fatty liver  3.  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 Patient with   longstanding, uncontrolled, insulin resistant type 2 diabetes, on U-500 insulin along with basal insulin and weekly GLP-1 receptor agonist, with still poor control.  He is not very compliant with the recommended  regimen.  At last visit I recommended to increase his Lantus dose.  At that time, HbA1c was slightly lower, at 8.8%.  At last visit, I strongly recommend to stop sweet drinks completely.  I also recommended to see the diabetes educator to make sure that he was injecting the insulin  correctly.  He mentions that he was forgetting doses and sometimes not taking insulin for days in a row.  Since the sugars remain high afterwards, I recommended to increase the dose of U-500 insulin, also, since last visit. -At last visit, he was also on vitamin A and zinc ever since the coronavirus pandemic.  I advised him to stop these right away. CGM interpretation: -At today's visit, we reviewed his CGM downloads: It appears that *** of values are in target range (goal >70%), while *** are higher than 180 (goal <25%), and *** are lower than 70 (goal <4%).  The calculated average blood sugar is ***.  The projected HbA1c for the next 3 months (GMI) is ***. -Reviewing the CGM trends, ***  -Reviewing the CGM trends, sugars appear to have improved slightly since last visit, he now has some values within the target range, especially in the evening and overnight.  Sugars do increase after the first meal of the day and peak after lunch.  Upon questioning, he is still drinking regular sodas and I again strongly advised him to stop completely.  We did increase his Lantus dose at today's visit.  Will continue the rest of the regimen pending continuing improvement in his diet. - I suggested to:  Patient Instructions  Please continue: - U500 insulin - 30 min before meals 180-200 units in the morning  180-200 units in the evening - Ozempic 1 mg weekly - Lantus 75 units daily at bedtime  STOP sweet drinks completely!  Please come back for the cortisol test. We check do this at the next appointment if you schedule it at 8-9 am.  Please return in 1.5 months.  - we checked his HbA1c: 7%  - advised to check sugars at different  times of the day - 4x a day, rotating check times - advised for yearly eye exams >> he is UTD - he has severe peripheral neuropathy for which she sees podiatry and pain management.   - return to clinic in 3-4 months  2. HL - Latest lipid panel was reviewed from 05/2023: Triglycerides elevated, HDL low -He was taken off pravastatin 80 mg daily due to muscle weakness.  However, he felt that muscle weakness was more related to the spine. - He is currently on 10 mg of Crestor daily.  He tolerates this well.  3.  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  On the CT scan from 09/22/2022, a comment was made about nodular thickening of the right adrenal gland and this can sometimes be associated with cortisol excess and, rarely, with aldosterone overproduction.  I am wondering if he has excess cortisol which can explain his insulin resistance, significant weakness, and insomnia.  He is also on multiple medications for blood pressure control. - We checked an ACTH, cortisol, and DHEA-S and they were all normal -I advised him to come back for a dexamethasone suppression test but he did not do so yet.  We again discussed about the test and advised him to return for this in the morning after taking dexamethasone the night before  Emilie Harden, MD PhD [facility] Endocrinology

## 2023-09-05 ENCOUNTER — Other Ambulatory Visit: Payer: Self-pay | Admitting: Internal Medicine

## 2023-09-14 ENCOUNTER — Telehealth: Payer: Self-pay | Admitting: Internal Medicine

## 2023-09-14 NOTE — Telephone Encounter (Signed)
 Called pt & he had concerns for his inguinal hernia. States he is having pain with activity & that this has been going on for 2 years and was unaddressed at time of OV. Reviewed pt's chart & PCP sent in a referral to surgery to address the hernia on 09/07/23. Advised pt to give PCP office a call to discuss update on referral. He also mentioned he was not able to complete MRI d/t pain & will have to reschedule to a later date. He had no questions regarding upcoming procedure with Dr. Rosaline Coma.

## 2023-09-14 NOTE — Telephone Encounter (Signed)
 Requesting to speak with a nurse in regurds to upcoming procedure as well as sypmtoms. Please advise

## 2023-09-19 ENCOUNTER — Ambulatory Visit: Payer: Medicare Other | Admitting: Internal Medicine

## 2023-09-20 ENCOUNTER — Telehealth: Payer: Self-pay | Admitting: Internal Medicine

## 2023-09-20 ENCOUNTER — Telehealth: Payer: Self-pay

## 2023-09-20 NOTE — Telephone Encounter (Signed)
 Received call from pt stating he is going to see a surgeon for hernia surgery this week and will need to hold his Warfarin pre-procedure. Pt is also scheduled for colonoscopy on 10/05/23; however, pt states he is going to cancel this procedure until after his hernia surgery. I made pt aware he will need a cardiac clearance for hernia surgery. I provided pt with pre-op fax number [phone] for surgeon's office to send request. Pt also overdue for INR to be checked. Scheduled coumadin  clinic appt for Tuesday, 09/26/23 at 1:45pm. Pt verbalized understanding.

## 2023-09-20 NOTE — Telephone Encounter (Signed)
 Patient called stating he wanted to cancel his upcoming colonoscopy.  At this time, he does not want to reschedule.

## 2023-09-25 ENCOUNTER — Telehealth: Payer: Self-pay

## 2023-09-25 ENCOUNTER — Other Ambulatory Visit: Payer: Self-pay | Admitting: Internal Medicine

## 2023-09-25 DIAGNOSIS — N1831 Chronic kidney disease, stage 3a: Secondary | ICD-10-CM

## 2023-09-25 MED ORDER — V-GO 20 20 UNIT/24HR KIT: 1.0000 | PACK | Freq: Every day | Status: DC

## 2023-09-25 MED ORDER — HUMULIN R U-500 (CONCENTRATED) 500 UNIT/ML ~~LOC~~ SOLN: SUBCUTANEOUS | 11 refills | Status: AC

## 2023-09-25 MED ORDER — V-GO 20 20 UNIT/24HR KIT
1.0000 | PACK | Freq: Every day | 3 refills | Status: AC
Start: 2023-09-25 — End: ?

## 2023-09-25 NOTE — Telephone Encounter (Signed)
 Received a message from [place] stating that she received a Vm from Daija ([facility] Nurse) stating that this pts blood sugar is above 400 (via fingerstick).   I called the patient to ask how much insulin  he was taking. He states that this morning he took 160 units of Humulin  U500 and last night he took the 75 units of Lantus . He also mentioned to me that his blood sugar was high around 500 yesterday but could not remember how much insulin  he took. He wants to know how should he proceed?

## 2023-09-25 NOTE — Telephone Encounter (Signed)
 I called and spoke with the patient. He did agree with starting this

## 2023-09-25 NOTE — Telephone Encounter (Signed)
 J, I would like him to start on the VGo20 pump with U-500 insulin .  Please check with him if he agrees.  I am not sure if we have any other options for him right now.  I would like to call in this to the pharmacy today and maybe have him see Stana Ear and Rice Chamorro tomorrow so we can start.  I will put the referral in.

## 2023-09-26 ENCOUNTER — Ambulatory Visit: Admitting: Skilled Nursing Facility1

## 2023-09-26 ENCOUNTER — Ambulatory Visit: Attending: Cardiology | Admitting: *Deleted

## 2023-09-26 ENCOUNTER — Telehealth: Payer: Self-pay | Admitting: Nutrition

## 2023-09-26 DIAGNOSIS — I4892 Unspecified atrial flutter: Secondary | ICD-10-CM | POA: Diagnosis not present

## 2023-09-26 DIAGNOSIS — Z7901 Long term (current) use of anticoagulants: Secondary | ICD-10-CM

## 2023-09-26 DIAGNOSIS — I480 Paroxysmal atrial fibrillation: Secondary | ICD-10-CM | POA: Diagnosis not present

## 2023-09-26 LAB — POCT INR: INR: 2.8 (ref 2.0–3.0)

## 2023-09-26 NOTE — Patient Instructions (Signed)
 Description   Continue taking warfarin 1/2 tablet daily except for 1 tablet every Fridays. Recheck INR in 5 weeks. Anticoagulation Clinic [phone]

## 2023-09-26 NOTE — Telephone Encounter (Signed)
 Pt. Called to see if he can come in today to be trained on the V-Go.  He reports that he can not today because he is in "too much pain from his 3 hernias.  Says FBS today was 171mg /dl. Have scheduled him for tomorrow and will call him in the AM to confirm this.  He was reminded to go by the pharmacy to pick up his U-500 insulin  in the vials and bring them with him.  Says pharmacy said they have no prescriptions for V-Gos.  They were called and said that both the v-gos and insulin  were ordered yesterday and will be in today.  I will tell the patient this tomorrow morning.

## 2023-09-27 ENCOUNTER — Ambulatory Visit: Admitting: Nutrition

## 2023-09-27 ENCOUNTER — Ambulatory Visit: Admitting: General Practice

## 2023-09-27 ENCOUNTER — Telehealth: Payer: Self-pay | Admitting: Gastroenterology

## 2023-09-27 NOTE — Telephone Encounter (Signed)
 Robin Wilkerson(Sister) who is on DPR called and said that patient doesn't need to be on pump, she asked if the provider can please look back at previous hospital visit. She doesn't think patient will do good on it. She ask for a call back at [phone].

## 2023-09-27 NOTE — Telephone Encounter (Signed)
 Procedure:Colonoscopy/Endoscopy Procedure date: 10/05/23 Procedure location: [facility] Arrival Time: 6:00 am Spoke with the patient Y/N:   Yes, Talked to the patient and he stated he was going to cancel his procedures and he is going with [facility]. He stated the he has been suffering with 3 different hernias for 2-3 years and no one has seemed to care so he is going with [facility].

## 2023-09-27 NOTE — Telephone Encounter (Signed)
 Spoke with Robin.  He is patient's brother and is afraid patient will not do well on a pump due to his opiod addition that he takes later in the day.  I explained the difference between a pump and a V-Go and that this device limits total daily insulin  dose to no more than 20u basal in 24 hours, and that it will require only one insulin , so that he does not get confused with both insulins.  Brother said he would encourage his wife to attend, or himself, which I totally agreed with.   LVM on cell phone that I was calling to confirm his visit today, not to take his Lantus  insulin  this AM, and he will need to bring a family member with him.

## 2023-09-27 NOTE — Telephone Encounter (Signed)
 NOTED

## 2023-09-28 ENCOUNTER — Other Ambulatory Visit: Payer: Self-pay

## 2023-09-28 DIAGNOSIS — R0602 Shortness of breath: Secondary | ICD-10-CM

## 2023-09-28 NOTE — Telephone Encounter (Signed)
 Hospital appt cancelled.

## 2023-10-02 ENCOUNTER — Encounter: Payer: Self-pay | Admitting: Nurse Practitioner

## 2023-10-02 ENCOUNTER — Ambulatory Visit: Payer: Medicare Other | Admitting: Nurse Practitioner

## 2023-10-03 ENCOUNTER — Ambulatory Visit: Admitting: Nutrition

## 2023-10-05 ENCOUNTER — Ambulatory Visit (HOSPITAL_BASED_OUTPATIENT_CLINIC_OR_DEPARTMENT_OTHER)
Admission: EM | Admit: 2023-10-05 | Discharge: 2023-10-05 | Disposition: A | Discharge status: Home / Self Care | Attending: Family Medicine | Admitting: Family Medicine

## 2023-10-05 ENCOUNTER — Ambulatory Visit (INDEPENDENT_AMBULATORY_CARE_PROVIDER_SITE_OTHER): Admitting: Radiology

## 2023-10-05 ENCOUNTER — Encounter (HOSPITAL_COMMUNITY): Payer: Self-pay

## 2023-10-05 ENCOUNTER — Encounter (HOSPITAL_BASED_OUTPATIENT_CLINIC_OR_DEPARTMENT_OTHER): Payer: Self-pay | Admitting: Emergency Medicine

## 2023-10-05 ENCOUNTER — Ambulatory Visit (HOSPITAL_COMMUNITY): Admit: 2023-10-05 | Admitting: Internal Medicine

## 2023-10-05 DIAGNOSIS — J441 Chronic obstructive pulmonary disease with (acute) exacerbation: Secondary | ICD-10-CM | POA: Diagnosis not present

## 2023-10-05 DIAGNOSIS — R059 Cough, unspecified: Secondary | ICD-10-CM

## 2023-10-05 DIAGNOSIS — R051 Acute cough: Secondary | ICD-10-CM

## 2023-10-05 DIAGNOSIS — J189 Pneumonia, unspecified organism: Secondary | ICD-10-CM

## 2023-10-05 DIAGNOSIS — R0602 Shortness of breath: Secondary | ICD-10-CM

## 2023-10-05 SURGERY — COLONOSCOPY WITH PROPOFOL
Anesthesia: Monitor Anesthesia Care

## 2023-10-05 MED ORDER — DOXYCYCLINE HYCLATE 100 MG PO CAPS: 100.0000 mg | ORAL_CAPSULE | Freq: Two times a day (BID) | ORAL | 0 refills | Status: AC

## 2023-10-05 MED ORDER — PREDNISONE 20 MG PO TABS
ORAL_TABLET | ORAL | 0 refills | Status: AC
Start: 2023-10-05 — End: ?

## 2023-10-05 MED ORDER — CEFTRIAXONE SODIUM 1 G IJ SOLR
1.0000 g | Freq: Once | INTRAMUSCULAR | Status: AC
  Administered 2023-10-05: 1 g via INTRAMUSCULAR

## 2023-10-05 NOTE — ED Provider Notes (Signed)
 [name] CARE    CSN: [id number] Arrival date & time: 10/05/23  1539      History   Chief Complaint Chief Complaint  Patient presents with   Cough   Shortness of Breath    HPI [name] is a 68 y.o. male.   Patient is here with his wife.  He reports that he is achy or sore all over, has a cough and congestion with wheezing and shortness of breath.  The respiratory symptoms have been present since 08/29/2023.  He also has COPD emphysema and congestive heart failure.  On Tuesdays and Thursdays he takes an extra dose of his furosemide  and so he reports that he has been urinating all day and feels like he is pulled off a lot of extra fluid for his heart failure.  He worries that he might have pneumonia.  He is short of breath.   Cough Associated symptoms: shortness of breath and wheezing   Associated symptoms: no chest pain, no chills, no ear pain, no fever, no rash and no sore throat   Shortness of Breath Associated symptoms: cough and wheezing   Associated symptoms: no abdominal pain, no chest pain, no ear pain, no fever, no rash, no sore throat and no vomiting     Past Medical History:  Diagnosis Date   Actinic keratosis    Arthritis    lower back   Asthma    Atrial fibrillation [facility])    Atrial flutter (HCC)    s/p CTI ablation by Dr Nunzio Belch   Brain aneurysm 2009   questionable. A follow up CTA in 2009 showed no evidence of   Chronic back pain    DDD/stenosis   Colon polyps    9 polyps removed 10/13/11   Complication of anesthesia 09/11/2012   slow to awaken after ablation   COPD (chronic obstructive pulmonary disease) (HCC)    Diabetes mellitus    takes Metformin  and Glimepiride  daily   Emphysema    GERD (gastroesophageal reflux disease)    takes Omeprazole  bid   Heart failure (HCC)    History of shingles    HLD (hyperlipidemia)    takes Pravastatin  daily   HTN (hypertension)    takes Prinizide daily   Obesity    OSA (obstructive sleep apnea)     not always using cpap   Overdose 2009   unintentional Flecanide overdose   Peripheral neuropathy    Short-term memory loss    Tobacco abuse     Patient Active Problem List   Diagnosis Date Noted   Acute encephalopathy 08/18/2023   Cellulitis of lower extremity 11/11/2022   Other atopic dermatitis 11/11/2022   Decreased functional mobility 09/08/2022   Fall at home 09/08/2022   Foot drop, bilateral 09/08/2022   Cigarette smoker 08/05/2022   Localized superficial mass 08/01/2022   Gout 08/01/2022   Tremor 03/20/2022   Cellulitis of right toe 03/06/2022   Neoplasm of uncertain behavior of skin of lower leg 01/31/2022   Cutaneous candidiasis 01/31/2022   Spinal stenosis, lumbar region, with neurogenic claudication 10/14/2021   S/P cervical spinal fusion 10/14/2021   Chronic pain of left knee 10/14/2021   Chronic painful diabetic neuropathy (HCC) 10/14/2021   Abdominal discomfort 09/26/2021   Hepatomegaly 09/26/2021   Splenomegaly 09/26/2021   Atrophy of muscle of multiple sites 08/12/2021   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   Chronic pain syndrome 08/12/2021   Depression, major, single episode, moderate (HCC) 08/12/2021   Arthritis of left knee  06/08/2021   Acute medial meniscal tear, left, initial encounter    Chronic meniscal tear of knee 03/15/2021   Dysphagia 04/17/2020   LVH (left ventricular hypertrophy) 11/07/2019   Heme positive stool 10/03/2019   Anemia 10/03/2019   Insulin  dependent type 2 diabetes mellitus (HCC) 08/26/2019   Chronic diastolic heart failure (HCC) 08/26/2019   Rash and nonspecific skin eruption 08/26/2019   Foot pain 08/22/2019   Right heart failure (HCC) 08/07/2019   Leg swelling 07/28/2019   Educated about COVID-19 virus infection 07/28/2019   Diabetes (HCC) 06/12/2019   Urticaria 05/15/2019   Dermatitis due to drug 04/18/2019   Edema 03/29/2019   Erectile dysfunction 03/29/2019   High risk medication use 03/29/2019    History of iron deficiency 03/29/2019   Pulmonary hypertension (HCC) 03/29/2019   Venous stasis dermatitis of both lower extremities 03/29/2019   Mixed hyperlipidemia 02/14/2019   Onychomycosis due to dermatophyte 11/06/2018   Hammer toe of right foot 07/17/2018   Tinea pedis of both feet 07/17/2018   History of radiofrequency ablation (RFA) procedure for cardiac arrhythmia 06/21/2018   Lumbar radiculopathy 06/21/2018   Hepatic cirrhosis (HCC) 12/26/2017   Left lower quadrant pain 12/05/2017   Change in bowel habits 12/05/2017   Palpitations 10/03/2017   Shortness of breath 04/12/2017   Chest pain 08/28/2016   Unstable angina (HCC) 08/26/2016   Longstanding persistent atrial fibrillation (HCC) 04/21/2015   Deviated nasal septum 02/19/2014   History of colon polyps 11/22/2012   Syncope 10/17/2012   Acute asthmatic bronchitis 11/22/2011   Benign neoplasm of colon 10/13/2011   Gastritis and gastroduodenitis 10/13/2011   GERD (gastroesophageal reflux disease) 10/13/2011   Body mass index (BMI) of 31.0-31.9 in adult 09/30/2011   Dyslipidemia 06/23/2011   Neuropathy 06/23/2011   Other fatigue 05/13/2011   Malaise and fatigue 05/13/2011   Rectal bleeding 04/18/2011   Atrial flutter (HCC) 04/18/2011   Chronic anticoagulation 11/10/2010   PAF (paroxysmal atrial fibrillation) (HCC)    Tobacco abuse    COPD  GOLD 2/ AB 02/11/2010   Essential hypertension 01/28/2010   OSA (obstructive sleep apnea) 01/28/2010    Past Surgical History:  Procedure Laterality Date   ANTERIOR CERVICAL DECOMP/DISCECTOMY FUSION  01/04/2012   Procedure: ANTERIOR CERVICAL DECOMPRESSION/DISCECTOMY FUSION 1 LEVEL/HARDWARE REMOVAL;  Surgeon: Elder Greening, MD;  Location: [facility] NEURO ORS;  Service: Neurosurgery;  Laterality: N/A;  explore cervical fusion Cervical six - seven  with removal of codman plate anterior cervical decompression with fusion interbody prothesis plating and bonegraft   APPENDECTOMY  2-29yrs ago    ATRIAL FIBRILLATION ABLATION N/A 09/11/2012   PT DID NOT HAVE AN ATRIAL FIBRILLATION ABLATION IN 2014!  ATRIAL FLUTTER ABLATION ONLY   ATRIAL FLUTTER ABLATION  09/11/2012   CTI ablation by Dr Nunzio Belch   BIOPSY  11/07/2019   Procedure: BIOPSY;  Surgeon: Janel Medford, MD;  Location: [facility] ENDOSCOPY;  Service: Endoscopy;;   CARDIAC CATHETERIZATION  2008   no significant CAD   CARDIOVERSION  05/05/2011   Procedure: CARDIOVERSION;  Surgeon: Lenise Quince, MD;  Location: [facility] ENDOSCOPY;  Service: Cardiovascular;  Laterality: N/A;   CARDIOVERSION Bilateral 07/26/2012   Procedure: CARDIOVERSION;  Surgeon: Eilleen Grates, MD;  Location: [facility] ENDOSCOPY;  Service: Cardiovascular;  Laterality: Bilateral;   CARPAL TUNNEL RELEASE  99/2000   bilateral   COLONOSCOPY WITH PROPOFOL  N/A 12/13/2012   Procedure: COLONOSCOPY WITH PROPOFOL ;  Surgeon: Janel Medford, MD;  Location: [facility] ENDOSCOPY;  Service: Endoscopy;  Laterality: N/A;  ELECTROPHYSIOLOGIC STUDY N/A 04/21/2015   Procedure: Atrial Fibrillation Ablation;  Surgeon: Jolly Needle, MD;  Location: [facility] INVASIVE CV LAB;  Service: Cardiovascular;  Laterality: N/A;   ESOPHAGOGASTRODUODENOSCOPY (EGD) WITH PROPOFOL  N/A 11/07/2019   Procedure: ESOPHAGOGASTRODUODENOSCOPY (EGD) WITH PROPOFOL ;  Surgeon: Janel Medford, MD;  Location: [facility] ENDOSCOPY;  Service: Endoscopy;  Laterality: N/A;   HERNIA REPAIR     KNEE ARTHROSCOPY WITH MEDIAL MENISECTOMY Left 04/23/2021   Procedure: LEFT KNEE ARTHROSCOPY WITH PARTIAL MEDIAL MENISCECTOMY;  Surgeon: Adah Acron, MD;  Location: [facility] OR;  Service: Orthopedics;  Laterality: Left;   KNEE SURGERY  6-49yrs ago   left   LEFT HEART CATH AND CORONARY ANGIOGRAPHY N/A 09/19/2016   Procedure: Left Heart Cath and Coronary Angiography;  Surgeon: Arty Binning, MD;  Location: [facility] INVASIVE CV LAB;  Service: Cardiovascular;  Laterality: N/A;   Left inguinal hernia repair     as a child   NASAL SEPTOPLASTY W/ TURBINOPLASTY Bilateral 02/19/2014    Procedure: NASAL SEPTOPLASTY WITH BILATERAL TURBINATE REDUCTION;  Surgeon: Lenton Rail, MD;  Location: [facility] OR;  Service: ENT;  Laterality: Bilateral;   NECK SURGERY  61yrs ago   right shoulder surgery  4-24yrs ago   cyst removed   TEE WITHOUT CARDIOVERSION  05/05/2011   Procedure: TRANSESOPHAGEAL ECHOCARDIOGRAM (TEE);  Surgeon: Lenise Quince, MD;  Location: [facility] ENDOSCOPY;  Service: Cardiovascular;  Laterality: N/A;   TEE WITHOUT CARDIOVERSION N/A 04/21/2015   Procedure: TRANSESOPHAGEAL ECHOCARDIOGRAM (TEE);  Surgeon: Jacqueline Matsu, MD;  Location: [facility] ENDOSCOPY;  Service: Cardiovascular;  Laterality: N/A;   THROAT SURGERY  4-80yrs ago   "thought " it was cancer but came back not   TONSILLECTOMY         Home Medications    Prior to Admission medications   Medication Sig Start Date End Date Taking? Authorizing Provider  bisoprolol  (ZEBETA ) 10 MG tablet Take 1 tablet (10 mg total) by mouth daily. 08/09/22  Yes Boscia, [name] E, NP  colchicine  0.6 MG tablet Take 1 tablet (0.6 mg total) by mouth daily. 08/21/23  Yes Hongalgi, Anand D, MD  doxycycline  (VIBRAMYCIN ) 100 MG capsule Take 1 capsule (100 mg total) by mouth 2 (two) times daily for 10 days. 10/05/23 10/15/23 Yes Guss Legacy, FNP  febuxostat  (ULORIC ) 40 MG tablet TAKE 1 TABLET BY MOUTH DAILY 12/09/22  Yes Laneta Pintos, MD  Fluticasone -Umeclidin-Vilant (TRELEGY ELLIPTA ) 100-62.5-25 MCG/ACT AEPB INHALE 1 PUFF BY MOUTH DAILY 07/17/23  Yes Wert, Michael B, MD  insulin  glargine (LANTUS  SOLOSTAR) 100 UNIT/ML Solostar Pen Inject 75 Units into the skin daily. 06/16/23  Yes Emilie Harden, MD  potassium chloride  SA (KLOR-CON  M) 20 MEQ tablet Take 1 tablet (20 mEq total) by mouth daily. TAKE AN EXTRA TABLET ON TUESDAY AND THURSDAY 08/04/23  Yes Eilleen Grates, MD  predniSONE  (DELTASONE ) 20 MG tablet Take number 2 pills (40 mg) once daily by mouth for 3 days.  Then take number 1 pill (20 mg) once daily by mouth for 3 days. 10/05/23  Yes Guss Legacy,  FNP  spironolactone  (ALDACTONE ) 25 MG tablet Take 0.5 tablets (12.5 mg total) by mouth daily. 08/29/23 11/27/23 Yes Meng, [name], PA  tamsulosin  (FLOMAX ) 0.4 MG CAPS capsule TAKE 1 CAPSULE BY MOUTH EACH NIGHT AT BEDTIME 07/15/21  Yes McKenzie, Arden Beck, MD  torsemide  (DEMADEX ) 20 MG tablet TAKE 2 TABELTS BY MOUTH DAILY IN THE MORNING AND 1 IN THE EVENING 04/24/23  Yes Hochrein, Royston Cornea, MD  Vitamin D , Ergocalciferol , (DRISDOL ) 1.25 MG (50000  UNIT) CAPS capsule Take 1 capsule (50,000 Units total) by mouth every 7 (seven) days. 11/01/22  Yes Boscia, Rumaldo Countess, NP  albuterol  (PROVENTIL ) (2.5 MG/3ML) 0.083% nebulizer solution Take 3 mLs (2.5 mg total) by nebulization every 6 (six) hours as needed for wheezing or shortness of breath. 06/29/23 06/28/24  Parrett, Macdonald Savoy, NP  albuterol  (VENTOLIN  HFA) 108 (90 Base) MCG/ACT inhaler INHALE 1-2 PUFFS INTO THE LUNGS EVERY 6 HOURS AS NEEDED FOR WHEEZING OR SHORTNESS OF BREATH 11/03/22   Parrett, Macdonald Savoy, NP  b complex vitamins tablet Take 1 tablet by mouth every Monday, Wednesday, and Friday.    [provider]  clobetasol  cream (TEMOVATE ) 0.05 % Apply 1 Application topically 2 (two) times daily as needed (Dermatitis). 08/21/23   Hongalgi, Anand D, MD  clotrimazole -betamethasone  (LOTRISONE ) cream Apply 1 Application topically daily as needed (Fungus). 08/21/23   Hongalgi, Anand D, MD  famotidine  (PEPCID ) 20 MG tablet One after supper Patient taking differently: Take 40 mg by mouth 2 (two) times daily. 08/04/22   Diamond Formica, MD  gabapentin  (NEURONTIN ) 300 MG capsule Take 3 capsules (900 mg total) by mouth 2 (two) times daily. 08/21/23   Hongalgi, Anand D, MD  Glucagon  3 MG/DOSE POWD Place 3 mg into the nose once as needed for up to 1 dose. 01/12/22   Emilie Harden, MD  Insulin  Disposable Pump (V-GO 20) 20 UNIT/24HR KIT 1 each by Does not apply route daily. 09/25/23   Emilie Harden, MD  insulin  regular human CONCENTRATED (HUMULIN  R U-500 KWIKPEN) 500 UNIT/ML KwikPen  Use up to 250-260 units of insulin  daily as advised 07/25/23   Emilie Harden, MD  insulin  regular human CONCENTRATED (HUMULIN  R) 500 UNIT/ML injection Use 280 units of insulin  a day in the VGo pump. 09/25/23   Emilie Harden, MD  ketoconazole  (NIZORAL ) 2 % cream Apply 1 Application topically daily. 01/31/22   Sharyon Deis, NP  lactulose , encephalopathy, (ENULOSE ) 10 GM/15ML SOLN TAKE 15 MLS BY MOUTH 3 TIMES DAILY AS DIRECTED 09/05/23   Dorsey, Ying C, MD  Meclizine HCl 25 MG CHEW Chew 1 tablet by mouth 3 (three) times daily as needed (Dizziness). 06/26/23   [provider]  metolazone  (ZAROXOLYN ) 2.5 MG tablet Take 1 tablet (2.5 mg total) by mouth as needed. 08/29/23   Meng, [name], PA  nicotine  (NICODERM CQ  - DOSED IN MG/24 HOURS) 21 mg/24hr patch Place 1 patch (21 mg total) onto the skin daily. 08/22/23   Hongalgi, Anand D, MD  nitroGLYCERIN  (NITROSTAT ) 0.4 MG SL tablet Place 1 tablet (0.4 mg total) under the tongue every 5 (five) minutes x 3 doses as needed for chest pain. 09/05/22   Boscia, [name] E, NP  oxyCODONE -acetaminophen  (PERCOCET) 10-325 MG tablet Take 1 tablet by mouth every 4 (four) hours. 08/21/23   Hongalgi, Anand D, MD  rosuvastatin  (CRESTOR ) 20 MG tablet Take 20 mg by mouth at bedtime. 06/26/23   [provider]  tacrolimus  (PROTOPIC ) 0.1 % ointment Apply 1 Application topically 2 (two) times daily as needed (Dermatitis). 08/21/23   Hongalgi, Anand D, MD  triamcinolone  cream (KENALOG ) 0.1 % Apply 1 Application topically daily as needed. 11/30/21   Boscia, [name] E, NP  VITAMIN A PO Take 2,400 mcg by mouth daily.     [provider]  warfarin (COUMADIN ) 5 MG tablet Take 1/2 to 1 tablet daily or as directed Patient taking differently: Take 2.5-5 mg by mouth daily with supper. Take 2.5mg  (1/2 tablet) by mouth daily, except on  Friday's, take 5mg  (1 tablet). 05/25/23   Eilleen Grates, MD    Family History Family History  Problem Relation Age of Onset   Diabetes  Mother    Heart disease Father    Lung cancer Father    Diabetes Maternal Grandmother    Colon cancer Neg Hx    Anesthesia problems Neg Hx    Hypotension Neg Hx    Malignant hyperthermia Neg Hx    Pseudochol deficiency Neg Hx    Esophageal cancer Neg Hx    Pancreatic cancer Neg Hx    Stomach cancer Neg Hx     Social History Social History   Tobacco Use   Smoking status: Every Day    Current packs/day: 1.00    Average packs/day: 1 pack/day for 49.0 years (49.0 ttl pk-yrs)    Types: Cigarettes, E-cigarettes   Smokeless tobacco: Never   Tobacco comments:    Smoking almost a pack per day Patient is vaping daily.  09/28/2022 am a pack of cigs in 4days 01/05/23  Vaping Use   Vaping status: Every Day  Substance Use Topics   Alcohol use: Not Currently    Alcohol/week: 0.0 standard drinks of alcohol    Comment: 12/05/17 pt stated he has drinked 1/2 drink in 3 years   Drug use: No     Allergies   Adhesive [tape] and Latex   Review of Systems Review of Systems  Constitutional:  Negative for chills and fever.  HENT:  Negative for ear pain and sore throat.   Eyes:  Negative for pain and visual disturbance.  Respiratory:  Positive for cough, shortness of breath and wheezing.   Cardiovascular:  Negative for chest pain and palpitations.  Gastrointestinal:  Negative for abdominal pain, constipation, diarrhea, nausea and vomiting.  Genitourinary:  Negative for dysuria and hematuria.  Musculoskeletal:  Positive for arthralgias. Negative for back pain.  Skin:  Negative for color change and rash.  Neurological:  Negative for seizures and syncope.  All other systems reviewed and are negative.    Physical Exam Triage Vital Signs ED Triage Vitals  Encounter Vitals Group     BP 10/05/23 1607 (!) 118/58     Systolic BP Percentile --      Diastolic BP Percentile --      Pulse Rate 10/05/23 1607 70     Resp 10/05/23 1607 18     Temp 10/05/23 1607 98.5 F (36.9 C)     Temp Source  10/05/23 1607 Oral     SpO2 10/05/23 1607 95 %     Weight --      Height --      Head Circumference --      Peak Flow --      Pain Score 10/05/23 1605 0     Pain Loc --      Pain Education --      Exclude from Growth Chart --    No data found.  Updated Vital Signs BP (!) 118/58 (BP Location: Left Arm)   Pulse 70   Temp 98.5 F (36.9 C) (Oral)   Resp 18   SpO2 95%   Visual Acuity Right Eye Distance:   Left Eye Distance:   Bilateral Distance:    Right Eye Near:   Left Eye Near:    Bilateral Near:     Physical Exam Vitals and nursing note reviewed.  Constitutional:      General: He is not in acute distress.    Appearance: He  is well-developed. He is morbidly obese. He is ill-appearing. He is not toxic-appearing.  HENT:     Head: Normocephalic and atraumatic.     Right Ear: Hearing, tympanic membrane, ear canal and external ear normal.     Left Ear: Hearing, tympanic membrane, ear canal and external ear normal.     Nose: Congestion and rhinorrhea present. Rhinorrhea is clear.     Right Sinus: No maxillary sinus tenderness or frontal sinus tenderness.     Left Sinus: No maxillary sinus tenderness or frontal sinus tenderness.     Mouth/Throat:     Lips: Pink.     Mouth: Mucous membranes are moist.     Pharynx: Uvula midline. No oropharyngeal exudate or posterior oropharyngeal erythema.     Tonsils: No tonsillar exudate.  Eyes:     Conjunctiva/sclera: Conjunctivae normal.     Pupils: Pupils are equal, round, and reactive to light.  Cardiovascular:     Rate and Rhythm: Normal rate and regular rhythm.     Pulses:          Posterior tibial pulses are 1+ on the right side and 1+ on the left side.     Heart sounds: S1 normal and S2 normal. No murmur heard. Pulmonary:     Effort: Pulmonary effort is normal. No respiratory distress.     Breath sounds: Examination of the right-upper field reveals wheezing. Examination of the left-upper field reveals wheezing. Examination of  the right-middle field reveals wheezing. Examination of the left-middle field reveals wheezing. Examination of the right-lower field reveals decreased breath sounds. Examination of the left-lower field reveals decreased breath sounds. Decreased breath sounds and wheezing present. No rhonchi or rales.  Abdominal:     General: Bowel sounds are normal.     Palpations: Abdomen is soft.     Tenderness: There is no abdominal tenderness.  Musculoskeletal:        General: No swelling.     Cervical back: Neck supple.     Right lower leg: No swelling. No edema.     Left lower leg: No swelling. No edema.     Right ankle: No swelling.     Left ankle: No swelling.  Lymphadenopathy:     Head:     Right side of head: No submental, submandibular, tonsillar, preauricular or posterior auricular adenopathy.     Left side of head: No submental, submandibular, tonsillar, preauricular or posterior auricular adenopathy.     Cervical: No cervical adenopathy.     Right cervical: No superficial cervical adenopathy.    Left cervical: No superficial cervical adenopathy.  Skin:    General: Skin is warm and dry.     Capillary Refill: Capillary refill takes less than 2 seconds.     Findings: No rash.  Neurological:     Mental Status: He is alert and oriented to person, place, and time.     Gait: Gait abnormal (Patient is unsteady on his feet and has 2 canes to help him walk.).  Psychiatric:        Mood and Affect: Mood normal.      UC Treatments / Results  Labs (all labs ordered are listed, but only abnormal results are displayed) Labs Reviewed - No data to display  EKG   Radiology DG Chest 2 View Result Date: 10/05/2023 CLINICAL DATA:  Cough and shortness of breath for 1 week. EXAM: CHEST - 2 VIEW COMPARISON:  08/18/2023 FINDINGS: The heart size and mediastinal contours are within normal limits.  Both lungs are clear. Cervical spine fusion hardware noted. IMPRESSION: No active cardiopulmonary disease.  Electronically Signed   By: Marlyce Sine M.D.   On: 10/05/2023 18:04    Procedures Procedures (including critical care time)  Medications Ordered in UC Medications  cefTRIAXone (ROCEPHIN) injection 1 g (1 g Intramuscular Given 10/05/23 1805)    Initial Impression / Assessment and Plan / UC Course  I have reviewed the triage vital signs and the nursing notes.  Pertinent labs & imaging results that were available during my care of the patient were reviewed by me and considered in my medical decision making (see chart for details).  Plan of Care: Chest x-ray read by radiology as negative or no acute cardiopulmonary disease.  There are some hazy areas but no consolidation.  There is hazy areas could be some early pneumonia or even some changes secondary to emphysema.  Based on his history and clinical findings, to treat him for early community-acquired pneumonia.  Rocephin 1 g IM now.  Prednisone  20 mg, 2 pills daily for 3 days then 1 pill daily for 3 days.  Doxycycline  100 mg twice daily for 10 days.  Continue inhalers as previously prescribed.  Get plenty of fluids and rest.  Follow-up with family practice in 2 to 3 weeks for recheck or sooner as needed.  I reviewed the plan of care with the patient and/or the patient's guardian.  The patient and/or guardian had time to ask questions and acknowledged that the questions were answered.  I provided instruction on symptoms or reasons to return here or to go to an ER, if symptoms/condition did not improve, worsened or if new symptoms occurred.  Final Clinical Impressions(s) / UC Diagnoses   Final diagnoses:  Acute cough  COPD exacerbation (HCC)  Community acquired pneumonia, unspecified laterality     Discharge Instructions      Chest x-ray appears to show some hazy areas that could indicate early community-acquired pneumonia versus and emphysema exacerbation.  Given his symptoms and condition, will treat for pneumonia.  Rocephin 1000 mg  injection now.  Doxycycline  100 mg twice daily for 10 days.  Prednisone  20 mg, 2 pills daily for 3 days then 1 pill daily for 3 days.  Continue using inhalers as previously prescribed.  Follow-up with family doctor in 2 or 3 weeks for recheck or sooner as needed.   ED Prescriptions     Medication Sig Dispense Auth. Provider   doxycycline  (VIBRAMYCIN ) 100 MG capsule Take 1 capsule (100 mg total) by mouth 2 (two) times daily for 10 days. 20 capsule Guss Legacy, FNP   predniSONE  (DELTASONE ) 20 MG tablet Take number 2 pills (40 mg) once daily by mouth for 3 days.  Then take number 1 pill (20 mg) once daily by mouth for 3 days. 9 tablet Artemus Romanoff, FNP      PDMP not reviewed this encounter.   Guss Legacy, FNP 10/05/23 [phone]

## 2023-10-05 NOTE — Discharge Instructions (Signed)
 Chest x-ray appears to show some hazy areas that could indicate early community-acquired pneumonia versus and emphysema exacerbation.  Given his symptoms and condition, will treat for pneumonia.  Rocephin 1000 mg injection now.  Doxycycline  100 mg twice daily for 10 days.  Prednisone  20 mg, 2 pills daily for 3 days then 1 pill daily for 3 days.  Continue using inhalers as previously prescribed.  Follow-up with family doctor in 2 or 3 weeks for recheck or sooner as needed.

## 2023-10-05 NOTE — ED Triage Notes (Signed)
 Pt c/o coughing, shortness of breath and congestion x 1 week was told by a home health nurse to come to urgent care for possible bronchitis or pneumonia.

## 2023-10-10 ENCOUNTER — Other Ambulatory Visit: Payer: Self-pay | Admitting: Nurse Practitioner

## 2023-10-10 ENCOUNTER — Other Ambulatory Visit: Payer: Self-pay | Admitting: Cardiology

## 2023-10-10 DIAGNOSIS — R079 Chest pain, unspecified: Secondary | ICD-10-CM

## 2023-10-24 ENCOUNTER — Telehealth: Payer: Self-pay | Admitting: Cardiology

## 2023-10-24 NOTE — Telephone Encounter (Signed)
 Returned call to patient- no answer, unable to leave message as no DPR on file.

## 2023-10-24 NOTE — Telephone Encounter (Signed)
 Pt c/o Shortness Of Breath: STAT if SOB developed within the last 24 hours or pt is noticeably SOB on the phone  1. Are you currently SOB (can you hear that pt is SOB on the phone)?  SOB  2. How long have you been experiencing SOB?  About 2-3 months   3. Are you SOB when sitting or when up moving around?  All the time, worse when up and moving around  4. Are you currently experiencing any other symptoms?  Mild CP for the past 2-3 month, comes and goes

## 2023-10-25 ENCOUNTER — Other Ambulatory Visit: Payer: Self-pay | Admitting: Nurse Practitioner

## 2023-10-25 NOTE — Telephone Encounter (Signed)
 Pt is returning call to a nurse

## 2023-10-25 NOTE — Telephone Encounter (Signed)
 Spoke with patient and shared Dr. Atlas Lea response:  He has had a very complicated history.  Last echo demonstrated well-preserved ejection fraction with some pulmonary hypertension.  His BNP done the other day was mildly elevated.  However, I would not want to give him more diuretic over the phone because of his renal insufficiency.  He is also not been clear in the past that his issues have been related to acute diastolic heart failure.  He was recently treated for pneumonia and I would suggest that he follow-up again with his primary provider.  He also has hospice coming I believe.  He would need to be seen in the office before we could manage any potential volume issues.    Scheduled appt with APP for 10/27/23 at 8:00 AM. Patient verbalized understanding and expressed appreciation for call.

## 2023-10-25 NOTE — Telephone Encounter (Signed)
 Left message for pt to call.

## 2023-10-25 NOTE — Telephone Encounter (Signed)
 Spoke with pt, he reports since his fluid pill was changed he is retaining fluid and is SOB. He reports being SOB all the time and it is gradually getting worse. He has some swelling in his feet and hands. He has not been weighing. He had labs drawn at [facility] on 6/9 but reports he has not heard anything from them. Aware will forward to hao meng and dr hochrein to review.

## 2023-10-27 ENCOUNTER — Encounter: Payer: Self-pay | Admitting: Emergency Medicine

## 2023-10-27 ENCOUNTER — Ambulatory Visit: Attending: Emergency Medicine | Admitting: Emergency Medicine

## 2023-10-27 VITALS — BP 110/50 | HR 71 | Ht 74.0 in | Wt 255.0 lb

## 2023-10-27 DIAGNOSIS — E785 Hyperlipidemia, unspecified: Secondary | ICD-10-CM | POA: Diagnosis not present

## 2023-10-27 DIAGNOSIS — K7460 Unspecified cirrhosis of liver: Secondary | ICD-10-CM

## 2023-10-27 DIAGNOSIS — I480 Paroxysmal atrial fibrillation: Secondary | ICD-10-CM

## 2023-10-27 DIAGNOSIS — G4733 Obstructive sleep apnea (adult) (pediatric): Secondary | ICD-10-CM

## 2023-10-27 DIAGNOSIS — R0602 Shortness of breath: Secondary | ICD-10-CM | POA: Diagnosis not present

## 2023-10-27 DIAGNOSIS — I10 Essential (primary) hypertension: Secondary | ICD-10-CM | POA: Diagnosis not present

## 2023-10-27 NOTE — Progress Notes (Unsigned)
 Cardiology Office Note:    Date:  10/29/2023  ID:  [name], DOB [birth year], MRN [id number] PCP: Mina Alter, FNP   [facility] Providers Cardiologist:  Eilleen Grates, MD       Patient Profile:       Chief Complaint: Acute visit for shortness of breath History of Present Illness:  [name] is a 68 y.o. male with visit-pertinent history of COPD/emphysema, hypertension, hyperlipidemia, type 2 diabetes, obesity, OSA intolerant of CPAP, history of tobacco abuse, atrial fibrillation and flutter  Patient had atrial fibrillation 2014.  Since he has both paroxysmal atrial fibrillation and atrial flutter, he underwent EP study with a second ablation on 04/21/2015.  Cardiac catheterization May 2018 showed widely patent coronary arteries with normal EF, moderate LVH.  Echocardiogram obtained on 08/14/2019 showed LVEF 60 to 65%, moderate LVH, grade 2 DD, moderately elevated PASP, severe left atrial enlargement, moderate right atrial enlargement, mild MR, mild to moderate TR.  Carotid Doppler obtained 09/30/2019 showed 1 to 39% disease in bilateral internal carotid arteries.  And cardiac MRI performed 12/16/2019 showed EF 66%, moderate concentric LVH, no myocardial LGE to suggest prior MI, infiltrative cardiac disease or myocarditis.  No evidence of cardiac amyloidosis.  Lower extremity arterial Doppler obtained on 02/12/2020 showed normal ABIs and triphasic blood flow through both legs, no vascular blockage was seen to explain his leg discomfort.  Venous Doppler obtained 04/29/2021 was negative for DVT.  ZIO monitor in February 2023 showed occasional SVT in the junctional rhythm but nothing to explain his complaints of presyncope.  Echocardiogram obtained 07/09/2021 showed LVEF 65 to 70%, moderate LVH, trivial MR, moderate to severe mitral annular calcification, no evidence of mitral stenosis, mild to moderate TR, mild to moderate aortic stenosis.  He was admitted to the hospital in 08/2023 with  confusion after being found less responsive at home and sitting in his chair in his backyard.  Patient did not recall the event.  His blood pressure is in the low 50s.  There is expected his acute metabolic encephalopathy was due to polypharmacy complicated by dehydration and hypotension.  Creatinine on arrival was 2.27, baseline was 1.2-1.5.  Creatinine did improve to 1.56 after IV hydration.  Echocardiogram obtained 08/2023 showed EF 65 to 70%, mild LVH, normal RV, RVSP 54.8, mild MR, mild aortic stenosis.  Patient was last seen in office on 08/29/2023.  His metolazone  that he was taking twice weekly was changed to as needed given his AKI.  He was then subsequently started on spironolactone  given his history of cirrhosis.   Discussed the use of AI scribe software for clinical note transcription with the patient, who gave verbal consent to proceed.  [name] is a 68 year old male who presents with ongoing shortness of breath and fluid retention.   He experiences worsening shortness of breath, abdominal swelling, and chest pain over the past couple of months. Chest pain has lessened in the last two days.  Denies any exertional chest pain.  Chest pain typically occurs at rest and has been ongoing for several months.  He attributes breathing difficulties to fluid buildup in his abdomen, describing it as feeling like a 'basketball'.   He is on spironolactone , Demadex , and metolazone  (once a week) for fluid management.  He has COPD and does not use his CPAP machine due to discomfort. He smokes one pack of cigarettes every two and a half days. Wheezing is present, and he was recently treated with antibiotics for  suspected pneumonia.  He has venous stasis in his legs, causing discoloration and pain.  He denies any leg swelling.  No lightheadedness, dizziness, syncope, presyncope, melena, hematochezia, palpitations.  Review of systems:  Please see the history of present illness. All other  systems are reviewed and otherwise negative.      Studies Reviewed:        Echocardiogram 08/21/2023  1. Left ventricular ejection fraction, by estimation, is 65 to 70%. The  left ventricle has normal function. The left ventricle has no regional  wall motion abnormalities. There is mild left ventricular hypertrophy.  Left ventricular diastolic parameters  are indeterminate.   2. Right ventricular systolic function is normal. The right ventricular  size is normal. There is moderately elevated pulmonary artery systolic  pressure. The estimated right ventricular systolic pressure is 54.8 mmHg.   3. The mitral valve is degenerative. Mild mitral valve regurgitation. No  evidence of mitral stenosis. Moderate mitral annular calcification.   4. The tricuspid valve is degenerative.   5. The aortic valve is tricuspid. There is moderate calcification of the  aortic valve. There is mild thickening of the aortic valve. Aortic valve  regurgitation is not visualized. Mild aortic valve stenosis. Aortic valve  area, by VTI measures 1.61 cm.  Aortic valve mean gradient measures 11.0 mmHg. Aortic valve Vmax measures  2.40 m/s.   6. The inferior vena cava is normal in size with greater than 50%  respiratory variability, suggesting right atrial pressure of 3 mmHg.   ZIO 06/28/2021 Normal sinus rhythm is the predominant rhythm. 18 runs of SVT with the longest lasting 14.5 seconds Junctional rhythm noted  Cardiac MRI 12/16/2019 IMPRESSION: 1. Mildly dilated left ventricle with moderate concentric LV hypertrophy. EF 66% with normal wall motion.   2.  Normal RV size and systolic function, EF 63%.   3. No myocardial LGE, so no evidence for prior MI, infiltrative disease, or myocarditis.   No evidence for cardiac amyloidosis => no LGE and ECV percentage not significantly elevated. Risk Assessment/Calculations:    CHA2DS2-VASc Score = 4   This indicates a 4.8% annual risk of stroke. The patient's  score is based upon: CHF History: 0 HTN History: 1 Diabetes History: 1 Stroke History: 0 Vascular Disease History: 1 Age Score: 1 Gender Score: 0         Physical Exam:   VS:  BP (!) 110/50 (BP Location: Left Arm, Patient Position: Sitting, Cuff Size: Normal)   Pulse 71   Ht 6' 2 (1.88 m)   Wt 255 lb (115.7 kg)   BMI 32.74 kg/m    Wt Readings from Last 3 Encounters:  10/27/23 255 lb (115.7 kg)  08/29/23 255 lb (115.7 kg)  08/21/23 263 lb 7.2 oz (119.5 kg)    GEN: Well nourished, well developed in no acute distress NECK: No JVD; No carotid bruits CARDIAC: Irregular irregular rhythm, no murmurs, rubs, gallops RESPIRATORY:  Clear to auscultation without rales, wheezing or rhonchi  ABDOMEN: Soft, non-tender, non-distended EXTREMITIES:  No edema; No acute deformity      Assessment and Plan:  Shortness of breath Noted to be short of breath over the past 2-3 months.  He has long history of chronic shortness of breath.  Which is likely a combination of his COPD, liver cirrhosis, HFpEF, uncontrolled diabetes, atrial fibrillation, and recent pneumonia treated on 5/22 His saturations were maintaining well in office.  He is not in any acute distress.  He is wheelchair-bound and now being referred  to hospice care per note on 6/9.   On exam, lungs were clear to auscultation with no lower extremity edema.  He has significant abdominal swelling likely related to his liver cirrhosis.  He is pending MRI abdomen with GI.  Unable to further titrate his diuretics as he is currently on metolazone  2.5 mg as needed (taking twice weekly), spironolactone  12.5 mg daily, and torsemide  60 mg daily with a creatinine of 2.11 and GFR 34.   Recent BNP improved to 228 on 6/9 from 528 so likely not HFpEF exacerbation.  Continues to smoke, no COPD exacerbation today.  His ascites from liver cirrhosis is likely the cause of his worsening chronic shortness of breath at this time.  He will need to follow-up closely  with GI and complete his MRI. His EKG today however does show atrial fibrillation that is rate controlled.  Given he is now considered for hospice care I feel as if rate control should be the goal at this time. He'll f/u with Dr. Lavonne Prairie in x1 month for repeat EKG.   Paroxysmal atrial fibrillation S/p atrial fibrillation/flutter ablation in 2014 EKG today shows patient is now in atrial fibrillation.  He has recently been maintaining NSR Given he is now in consideration for palliative/hospice care, I would recommend rate control therapy at this time He will f/u with Dr. Lavonne Prairie in x1 month for repeat EKG to determine if any further intervention is warranted - Continue bisoprolol  10 mg daily - Continue to Coumadin , managed by Coumadin  clinic  Hypertension Blood pressure today is 110/50 and well-controlled - Continue current antihypertensive regimen  Hyperlipidemia LDL 63 on 07/2022  - Continue Rosuvastatin  20 mg daily  Type 2 diabetes A1c 9.4% on 08/2023 - Management per PCP  EtOH cirrhosis - Managed by GI service.  Pending abdominal MRI  Obstructive sleep apnea - Remains nonadherent to CPAP.  CKD  Creatinine 2.11 and GFR 34 on 10/23/2023 - Maintain adequate hydration      Dispo:  Return in about 1 month (around 11/26/2023).  Signed, Ava Boatman, NP

## 2023-10-27 NOTE — Patient Instructions (Signed)
 Medication Instructions:  NO CHANGES  Lab Work: NONE   Testing/Procedures: NONE  Follow-Up: At [facility], you and your health needs are our priority.  As part of our continuing mission to provide you with exceptional heart care, our providers are all part of one team.  This team includes your primary Cardiologist (physician) and Advanced Practice Providers or APPs (Physician Assistants and Nurse Practitioners) who all work together to provide you with the care you need, when you need it.  Your next appointment:   1 MONTH  Provider:   Eilleen Grates, MD

## 2023-10-29 ENCOUNTER — Encounter: Payer: Self-pay | Admitting: Emergency Medicine

## 2023-10-30 ENCOUNTER — Ambulatory Visit: Payer: Self-pay

## 2023-10-30 NOTE — Telephone Encounter (Signed)
 FYI Only or Action Required?: FYI only for provider  Patient is followed in Pulmonology for COPD, last seen on 06/29/2023 by Parrett, Macdonald Savoy, NP. Called Nurse Triage reporting Shortness of Breath. Symptoms began several weeks ago. Interventions attempted: Rescue inhaler and Maintenance inhaler. Symptoms are: gradually worsening.   Triage Disposition: See Physician Within 24 Hours  Patient/caregiver understands and will follow disposition?: Unsure   Reason for Disposition  [1] MILD difficulty breathing (e.g., minimal/no SOB at rest, SOB with walking) AND [2] worse than normal  Answer Assessment - Initial Assessment Questions E2C2 Pulmonary Triage - Initial Assessment Questions Chief Complaint (e.g., cough, sob, wheezing, fever, chills, sweat or additional symptoms) *Go to specific symptom protocol after initial questions. SOB, productive cough Reports had a bad day on Fathers day and got dizzy and out of it Reports hx of CHF and went to heart doctor Friday - denies weight gain, reports taking water pills  How long have symptoms been present? Several weeks  Have you tested for COVID or Flu? Note: If not, ask patient if a home test can be taken. If so, instruct patient to call back for positive results. No  MEDICINES:    Have you used your inhalers/maintenance medication? Yes If yes, What medications? Trelegy - 1 puff daily Albuterol  PRN - last used Sunday - provided some relief  If inhaler, ask How many puffs and how often? Note: Review instructions on medication in the chart. See above  OXYGEN : Do you wear supplemental oxygen ? No If yes, How many liters are you supposed to use? N/a  Do you monitor your oxygen  levels? Yes If yes, What is your reading (oxygen  level) today? 95 on RA  What is your usual oxygen  saturation reading?  (Note: Pulmonary O2 sats should be 90% or greater) 90s      8. BREATHING DIFFICULTY: Are you having any difficulty  breathing? If Yes, ask: How bad is it?  (e.g., none, mild, moderate, severe)   - MILD: No SOB at rest, mild SOB with walking, speaks normally in sentences, able to lie down, no retractions, pulse < 100.   - MODERATE: SOB at rest, SOB with minimal exertion and prefers to sit, cannot lie down flat, speaks in phrases, mild retractions, audible wheezing, pulse 100-120.   - SEVERE: Very SOB at rest, speaks in single words, struggling to breathe, sitting hunched forward, retractions, pulse > 120      I really don't have a good answer to that Wife reports mild SOB with exertion Triager does not appreciate audible SOB/wheezing during call. Pt is speaking in full sentences.  9. OTHER SYMPTOMS: Do you have any other symptoms? (e.g., fever, change in sputum)     Productive cough, forgetfulness  Protocols used: COPD Oxygen  Monitoring and Hypoxia-A-AH

## 2023-10-30 NOTE — Telephone Encounter (Signed)
 Call dropped during triage due to tecnical difficulties. Triager called Pt back and LVM to to continue triage.

## 2023-10-30 NOTE — Telephone Encounter (Signed)
 Answer Assessment - Initial Assessment Questions E2C2 Pulmonary Triage - Initial Assessment Questions Chief Complaint (e.g., cough, sob, wheezing, fever, chills, sweat or additional symptoms) *Go to specific symptom protocol after initial questions. SOB, productive gray cough Reports currently on abx for leg infection (cephalexin ?)  How long have symptoms been present? Several weeks  Have you tested for COVID or Flu? Note: If not, ask patient if a home test can be taken. If so, instruct patient to call back for positive results. No  MEDICINES:   Have you used any OTC meds to help with symptoms?  If yes, ask What medications? *No Answer*  Have you used your inhalers/maintenance medication?  If yes, What medications? *No Answer*  If inhaler, ask How many puffs and how often? Note: Review instructions on medication in the chart. *No Answer*  OXYGEN : Do you wear supplemental oxygen ? No If yes, How many liters are you supposed to use? N/a  Do you monitor your oxygen  levels? Yes If yes, What is your reading (oxygen  level) today? 95 on RA Reports that on bad days is in 80's and uses wife's O2 to recover  What is your usual oxygen  saturation reading?  (Note: Pulmonary O2 sats should be 90% or greater) *No Answer*     1. RESPIRATORY STATUS: Describe your breathing? (e.g., wheezing, shortness of breath, unable to speak, severe coughing)      *No Answer* 2. ONSET: When did this breathing problem begin?      *No Answer* 3. PATTERN Does the difficult breathing come and go, or has it been constant since it started?      *No Answer* 4. SEVERITY: How bad is your breathing? (e.g., mild, moderate, severe)    - MILD: No SOB at rest, mild SOB with walking, speaks normally in sentences, can lie down, no retractions, pulse < 100.    - MODERATE: SOB at rest, SOB with minimal exertion and prefers to sit, cannot lie down flat, speaks in phrases, mild  retractions, audible wheezing, pulse 100-120.    - SEVERE: Very SOB at rest, speaks in single words, struggling to breathe, sitting hunched forward, retractions, pulse > 120      *No Answer* 5. RECURRENT SYMPTOM: Have you had difficulty breathing before? If Yes, ask: When was the last time? and What happened that time?      *No Answer* 6. CARDIAC HISTORY: Do you have any history of heart disease? (e.g., heart attack, angina, bypass surgery, angioplasty)      *No Answer* 7. LUNG HISTORY: Do you have any history of lung disease?  (e.g., pulmonary embolus, asthma, emphysema)     *No Answer* 8. CAUSE: What do you think is causing the breathing problem?      *No Answer* 9. OTHER SYMPTOMS: Do you have any other symptoms? (e.g., dizziness, runny nose, cough, chest pain, fever)     *No Answer* 10. O2 SATURATION MONITOR:  Do you use an oxygen  saturation monitor (pulse oximeter) at home? If Yes, ask: What is your reading (oxygen  level) today? What is your usual oxygen  saturation reading? (e.g., 95%)       *No Answer* 11. PREGNANCY: Is there any chance you are pregnant? When was your last menstrual period?       *No Answer* 12. TRAVEL: Have you traveled out of the country in the last month? (e.g., travel history, exposures)       *No Answer*  Protocols used: Breathing Difficulty-A-AH

## 2023-10-30 NOTE — Telephone Encounter (Signed)
 Copied from CRM [id number]. Topic: Clinical - Red Word Triage >> Oct 30, 2023  4:34 PM [name] wrote: Red Word that prompted transfer to Nurse Triage: Pt has COPD and has been using his wife's oxygen . Pt is not on oxygen . Pt has SOB, difficulty breathing, and would like to see about getting his own supply of oxygen .

## 2023-10-31 ENCOUNTER — Ambulatory Visit

## 2023-10-31 NOTE — Telephone Encounter (Signed)
 FYI pt has appt on 11/03/2023

## 2023-11-01 ENCOUNTER — Other Ambulatory Visit: Payer: Self-pay | Admitting: Internal Medicine

## 2023-11-02 ENCOUNTER — Ambulatory Visit

## 2023-11-03 ENCOUNTER — Ambulatory Visit: Admitting: Nurse Practitioner

## 2023-11-03 ENCOUNTER — Ambulatory Visit: Payer: Self-pay | Admitting: Family Medicine

## 2023-11-03 NOTE — Telephone Encounter (Signed)
 Spoke with pt and strongly encouraged pt to seek care at the ER or another local hospital ER for management. He eventually agreed and would like to get fluid off his chest and heart -- a thoracentesis done -- reminded him of his coumadin  appointment in 3 days and to please keep his cardiology appointment in July. Advised to continue his prescribed inhaler/nebulizer medications as needed.

## 2023-11-03 NOTE — Telephone Encounter (Signed)
 FYI Only or Action Required?: Action required by provider: update on patient condition.  Patient is followed in Pulmonology for COPD, last seen on 06/29/2023 by Parrett, Macdonald Savoy, NP. Called Nurse Triage reporting Shortness of Breath. Symptoms began several months ago.  Symptoms are: gradually worsening.  Triage Disposition: Call EMS 911 Now, Go to ED Now (Notify PCP)  Patient/caregiver understands and will follow disposition?: No, wishes to speak with PCP                            Copied from CRM [id number]. Topic: Clinical - Red Word Triage >> Nov 03, 2023 10:56 AM Justina Oman C wrote: Red Word that prompted transfer to Nurse Triage: Patient [phone] had to cancel appointment for 11/03/23 at 11:30 am with NP, Cobb in [place] due to being sick, coughing up phlegm and patient needs oxygen . Patient states shortness of breathing, wheezing, dizziness, oxygen  level is up and down 87-95, right now sitting oxygen  level is 95. Patient wants to be seen this afternoon, please advise. Reason for Disposition  Sounds like a life-threatening emergency to the triager  [1] MODERATE difficulty breathing (e.g., speaks in phrases, SOB even at rest, pulse 100 - 120) AND [2] new-onset or worse than normal  Answer Assessment - Initial Assessment Questions Patient had to cancel appt this morning with Cobb, NP, due to a bad night with mucus. Patient wants to see if there is any availability this afternoon. This RN recommends pt goes to ED and pt refused. This RN notified [facility] of pt refusal.   1. RESPIRATORY STATUS: Describe your breathing? (e.g., wheezing, shortness of breath, unable to speak, severe coughing)      Productive cough, a lot of mucous, runny nose, wheezing  2. ONSET: When did this breathing problem begin?      Been a while back gotten worse, over a month 3. PATTERN Does the difficult breathing come and go, or has it been constant since it started?      Comes and  goes 4. SEVERITY: How bad is your breathing? (e.g., mild, moderate, severe)    - MILD: No SOB at rest, mild SOB with walking, speaks normally in sentences, can lie down, no retractions, pulse < 100.    - MODERATE: SOB at rest, SOB with minimal exertion and prefers to sit, cannot lie down flat, speaks in phrases, mild retractions, audible wheezing, pulse 100-120.    - SEVERE: Very SOB at rest, speaks in single words, struggling to breathe, sitting hunched forward, retractions, pulse > 120      Moderate-severe, at times having chest pain with it (pt states it has been going on for years ,not real bad pain level, too much fluid), pt states he has CHF and saw his cardiologist a week ago and everything was fine with his heart 9. OTHER SYMPTOMS: Do you have any other symptoms? (e.g., dizziness, runny nose, cough, chest pain, fever)     Dizziness with sitting down, feels like going to pass out, going on for a while per patient 10. O2 SATURATION MONITOR:  Do you use an oxygen  saturation monitor (pulse oximeter) at home? If Yes, ask: What is your reading (oxygen  level) today? What is your usual oxygen  saturation reading? (e.g., 95%)       80-95%, does not use oxygen , uses wife's at home oxygen  when he needs it- appt this morning was to check about oxygen ; 78%, 75%, 82%  Protocols used: Breathing Difficulty-A-AH, Oxygen   Monitoring and Hypoxia-A-AH

## 2023-11-06 ENCOUNTER — Encounter: Payer: Self-pay | Admitting: Internal Medicine

## 2023-11-06 ENCOUNTER — Ambulatory Visit: Admitting: Internal Medicine

## 2023-11-06 ENCOUNTER — Ambulatory Visit: Attending: Internal Medicine

## 2023-11-06 VITALS — BP 120/80 | HR 68 | Ht 74.0 in | Wt 250.0 lb

## 2023-11-06 DIAGNOSIS — E785 Hyperlipidemia, unspecified: Secondary | ICD-10-CM | POA: Diagnosis not present

## 2023-11-06 DIAGNOSIS — Z5181 Encounter for therapeutic drug level monitoring: Secondary | ICD-10-CM

## 2023-11-06 DIAGNOSIS — I480 Paroxysmal atrial fibrillation: Secondary | ICD-10-CM | POA: Diagnosis not present

## 2023-11-06 DIAGNOSIS — N1831 Chronic kidney disease, stage 3a: Secondary | ICD-10-CM | POA: Diagnosis not present

## 2023-11-06 DIAGNOSIS — E278 Other specified disorders of adrenal gland: Secondary | ICD-10-CM | POA: Diagnosis not present

## 2023-11-06 DIAGNOSIS — E1122 Type 2 diabetes mellitus with diabetic chronic kidney disease: Secondary | ICD-10-CM

## 2023-11-06 DIAGNOSIS — Z794 Long term (current) use of insulin: Secondary | ICD-10-CM | POA: Diagnosis not present

## 2023-11-06 LAB — POCT INR: INR: 3.7 — AB (ref 2.0–3.0)

## 2023-11-06 LAB — POCT GLYCOSYLATED HEMOGLOBIN (HGB A1C): Hemoglobin A1C: 10.4 % — AB (ref 4.0–5.6)

## 2023-11-06 NOTE — Patient Instructions (Signed)
 HOLD Tonight and Tuesday then Continue taking warfarin 1/2 tablet daily except for 1 tablet every Fridays. Recheck INR in 3 weeks. Anticoagulation Clinic [phone]
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
## 2023-11-06 NOTE — Progress Notes (Signed)
Please see anticoagulation encounter.

## 2023-11-06 NOTE — Patient Instructions (Addendum)
 Please continue: - U500 insulin  - 30 min before meals 150 units in the morning  130 units in the evening Add 40-60 units before lunch and before a meal at night. - Ozempic  1 mg weekly - Lantus  75 units daily at bedtime   STOP sweet drinks completely!   STOP EATING AT NIGHT.   Please come back for the cortisol test. We check do this at the next appointment if you schedule it at 8-9 am.   Please return in 2 months.

## 2023-11-07 NOTE — Addendum Note (Signed)
 Addended by: CLEOTILDE ROLIN RAMAN on: 11/07/2023 01:26 PM   Modules accepted: Orders

## 2023-11-24 ENCOUNTER — Other Ambulatory Visit: Payer: Self-pay | Admitting: Cardiology

## 2023-11-28 NOTE — Progress Notes (Unsigned)
 Cardiology Office Note:   Date:  11/30/2023  ID:  [name], DOB [birth year], MRN [id number] PCP: Jackolyn Darice BROCKS, FNP  [facility] Providers Cardiologist:  Lynwood Schilling, MD {  History of Present Illness:   [name] is a 68 y.o. male who presents for follow up of atrial fibrillation and atrial flutter. He is s/p flutter ablation in 2014 and has actually done well from this standpoint.  He has multiple chronic complaints.  He was in hospice care.  However, he kind of dismissed them when they told him he could not come to see his doctors or come to the hospital which is his report.  He is being managed for cirrhosis.  He was just started on lactulose  for elevated ammonia levels.  He has a hypodensity on CT of his liver but when he went to get an MRI he said they were rough with him so he came off the table and never got this completed.  He complains mostly of leg pain.  This has been a chronic problem.  He gets around on his riding lawnmower and with a walker.  He says he has had some falls.  He says he passes out and does this fairly frequently.  He is not having any new chest discomfort, neck or arm discomfort.  He has had extensive somatic complaints.  He has had a workup for amyloid with negative MRI.  He has had some elevated coronary calcium  but no obstructive disease on cath in 2018.  He had an echocardiogram in April 2025 that demonstrated an EF of 65 to 70%.  He had moderately elevated pulmonary pressures.  He had mild aortic valve stenosis.   ROS: As stated in the HPI and negative for all other systems.   Studies Reviewed:    EKG:     Atrial fibrillation, rate 71, axis within normal limits, intervals within normal limits, no acute ST-T wave changes.   Risk Assessment/Calculations:    CHA2DS2-VASc Score = 4   This indicates a 4.8% annual risk of stroke. The patient's score is based upon: CHF History: 0 HTN History: 1 Diabetes History: 1 Stroke History:  0 Vascular Disease History: 1 Age Score: 1 Gender Score: 0   Physical Exam:   VS:  BP (!) 106/58 (BP Location: Left Arm, Patient Position: Sitting)   Pulse 70   Ht 6' 2 (1.88 m)   Wt 256 lb (116.1 kg)   SpO2 98%   BMI 32.87 kg/m    Wt Readings from Last 3 Encounters:  11/30/23 256 lb (116.1 kg)  11/06/23 250 lb (113.4 kg)  10/27/23 255 lb (115.7 kg)     GEN: Well nourished, well developed in no acute distress NECK: No JVD; No carotid bruits CARDIAC: Irregular RR, 2 out of 6 apical systolic murmur radiating slightly at the aortic outflow tract, no diastolic murmurs, rubs, gallops RESPIRATORY:  Clear to auscultation without rales, wheezing or rhonchi  ABDOMEN: Soft, non-tender, abdomen-distended EXTREMITIES:  No deformity , mild bilateral lower extremity edema with chronic venous stasis changes  ASSESSMENT AND PLAN:   Edema/SOB: These are chronic issues actually think he is euvolemic.  No change in therapy.  He is not describing acute respiratory complaint such as PND or orthopnea.   COPD (chronic obstructive pulmonary disease) (HCC): Per pulmonary.  He has canceled previous pulmonary appointments.   Essential hypertension: His blood pressure is controlled.  No change in therapy.   Sleep apnea: He does not use  CPAP.   Non-insulin  dependent type 2 diabetes mellitus (HCC): A1c is 10.4.  He was previously followed by endocrinology.  PAF (paroxysmal atrial fibrillation) (HCC): He tolerates anticoagulation.  No change in therapy.  LEG PAIN: He has been evaluated and has some spinal stenosis and also probable neuropathy.  There is not been a vascular etiology.  SYNCOPE:   He gives a vague history of syncope.   I will have him wear a monitor although the last one did not stay on.  Follow up with APP in 6 months  Signed, Lynwood Schilling, MD

## 2023-11-30 ENCOUNTER — Encounter: Payer: Self-pay | Admitting: Cardiology

## 2023-11-30 ENCOUNTER — Ambulatory Visit: Attending: Cardiology | Admitting: Cardiology

## 2023-11-30 ENCOUNTER — Ambulatory Visit: Admitting: *Deleted

## 2023-11-30 VITALS — BP 106/58 | HR 70 | Ht 74.0 in | Wt 256.0 lb

## 2023-11-30 DIAGNOSIS — J449 Chronic obstructive pulmonary disease, unspecified: Secondary | ICD-10-CM

## 2023-11-30 DIAGNOSIS — R0602 Shortness of breath: Secondary | ICD-10-CM | POA: Diagnosis not present

## 2023-11-30 DIAGNOSIS — Z5181 Encounter for therapeutic drug level monitoring: Secondary | ICD-10-CM

## 2023-11-30 DIAGNOSIS — I480 Paroxysmal atrial fibrillation: Secondary | ICD-10-CM | POA: Diagnosis not present

## 2023-11-30 DIAGNOSIS — I10 Essential (primary) hypertension: Secondary | ICD-10-CM

## 2023-11-30 DIAGNOSIS — I517 Cardiomegaly: Secondary | ICD-10-CM

## 2023-11-30 LAB — POCT INR: POC INR: 3.8

## 2023-11-30 NOTE — Patient Instructions (Signed)
 Medication Instructions:  Your physician recommends that you continue on your current medications as directed. Please refer to the Current Medication list given to you today.  *If you need a refill on your cardiac medications before your next appointment, please call your pharmacy*  Lab Work: NONE If you have labs (blood work) drawn today and your tests are completely normal, you will receive your results only by: MyChart Message (if you have MyChart) OR A paper copy in the mail If you have any lab test that is abnormal or we need to change your treatment, we will call you to review the results.  Testing/Procedures: 2 Week Event Monitor Your physician has recommended that you wear an event monitor. Event monitors are medical devices that record the heart's electrical activity. Doctors most often us  these monitors to diagnose arrhythmias. Arrhythmias are problems with the speed or rhythm of the heartbeat. The monitor is a small, portable device. You can wear one while you do your normal daily activities. This is usually used to diagnose what is causing palpitations/syncope (passing out).   Follow-Up: At [facility], you and your health needs are our priority.  As part of our continuing mission to provide you with exceptional heart care, our providers are all part of one team.  This team includes your primary Cardiologist (physician) and Advanced Practice Providers or APPs (Physician Assistants and Nurse Practitioners) who all work together to provide you with the care you need, when you need it.  Your next appointment:     Provider:   [place], MD  We recommend signing up for the patient portal called MyChart.  Sign up information is provided on this After Visit Summary.  MyChart is used to connect with patients for Virtual Visits (Telemedicine).  Patients are able to view lab/test results, encounter notes, upcoming appointments, etc.  Non-urgent messages can be sent to your  provider as well.   To learn more about what you can do with MyChart, go to [email or web address].

## 2023-11-30 NOTE — Progress Notes (Signed)
Please see anticoagulation encounter.

## 2023-11-30 NOTE — Patient Instructions (Signed)
 Description   Hold warfarin today and then START taking warfarin 1/2 a tablet daily. Recheck INR in 2 weeks. Anticoagulation Clinic [phone]

## 2023-12-15 ENCOUNTER — Ambulatory Visit: Attending: Cardiology

## 2023-12-15 DIAGNOSIS — Z5181 Encounter for therapeutic drug level monitoring: Secondary | ICD-10-CM | POA: Diagnosis not present

## 2023-12-15 DIAGNOSIS — I480 Paroxysmal atrial fibrillation: Secondary | ICD-10-CM

## 2023-12-15 LAB — POCT INR: INR: 3.8 — AB (ref 2.0–3.0)

## 2023-12-15 NOTE — Progress Notes (Signed)
 INR 3.8; Please see anticoagulation encounter

## 2023-12-15 NOTE — Patient Instructions (Signed)
 Hold warfarin today, Saturday and Sunday and then continue taking warfarin 1/2 a tablet daily. Recheck INR in 3 weeks. Anticoagulation Clinic [phone]

## 2023-12-21 NOTE — Progress Notes (Deleted)
 I saw [name] in neurology clinic on 12/29/23 in follow up for neuropathy.  HPI: [name] is a 68 y.o. 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  To briefly review: Initial consultation 03/31/22: Patient started noticing symptoms 2-2.5 years ago. He relates that his symptoms started having muscle loss, weakness, and tremors. He states prior to this, he was strong. He still tries to stay active, but his muscle loss has slowed him down. He has tingling and numbness in bilateral legs and hands.   He uses a cane to ambulate mostly, but sometimes also uses a walker. He falls often. He mentions that when he is just standing, he tends to get off balance. He falls about once per month. If he is walking, he does not have problems. If he tries to look up or down or turns, that is most likely time to fall. He does occasionally freeze when walking.   Patient mentions he has had a tremor in his left hand since 1998 after his cervical spine fusion. He mentions now that he has tremors all over. His body will jump and almost throw him out of a chair. He mentions that when he is more nervous his tremors worsen.   He relates a long history of neck and back problems. He mentions lower back pain since the age of 51. He also states his cervical spine is worn out.    The patient denies symptoms suggestive of oculobulbar weakness including diplopia, ptosis, poor saliva control, dysarthria/dysphonia, impaired mastication, facial weakness/droop.    He does sometimes have difficulty getting food down and things will get stuck. Sometimes he has to swallow liquids twice. Per patient, he has something growing in his neck. I do not see notes about this.   Patient has COPD and chronic shortness of breath and has had  difficulty laying on his back due to shortness of breath for about 25 years.   Pseudobulbar affect is absent.   Patient has lost 15-20 pounds over the last 2 years without trying. He has put some weight back on lately.   Patient takes gabapentin  900 mg BID for neuropathy. The pain in his legs can be very severe, especially at night.   Of note, patient is on warfarin for afib.   Patient was on pravastatin  but stopped recently due to weakness. He has been off of it for 1-2 weeks. He thinks it may have helped, but is not sure.   Patient was sent to PT. He went once in the summer of 2023. He stopped due to pain.   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 Patient thinks his tremor would go away years ago after drinking. Restrictive diet? No Family history of neuropathy/myopathy/NM disease? Son has tremors   07/06/22: Blood work was significant for low B1. I recommended supplementation with 100 mg daily on 04/11/22. He takes B vitamin but is not sure if he is taking B1.   He continues to have a lot a pain. He has pain in neck, back, and legs. The majority of his pain is in his back. He has seen spine surgery in the past and was told there was nothing they could do as his heart doctor would not clear him. I recommended  MRI cervical spine at last visit, but patient has not gotten it. He said he tried to call but no one responded to his calls about this.   He feels weaker and like he may be headed toward a wheelchair. He just got a new shower that will have a seat and bars. He is getting this installed soon.   His tremor is about the same as prior. He was unable to use the primidone  as it would make him sleep too much.   He still has difficulty occasionally with swallowing. He has seen ENT in the past and was told he had a worm like thing in throat that hangs up his food. He does not have much difficulty with liquids. He has not had a swallow evaluation.    01/05/23: Patient has significant back pain and did not think he could tolerate the MRIs, so he never got them done. He continues to have a lot of back pain. He recently was given a Press photographer, which he is happy about and plans to use.   He has had some episodes of near syncope or syncope. He had his BP medication adjusted that has really helped him with this. Patient has had frequent falls, up to 3-4 times per week. He describes back pain and his right leg giving out. He had a CT head on 10/26/22 that showed no acute process. Patient is not sure if home PT has been ordered.   Swallow evaluation on 08/03/22 was essentially unremarkable. Patient continues to complain of getting food stuck in his throat with a lot of acid reflux and belching.   Patient mentions he and his wife's health is so poor that they have difficulty doing things around the house, such as cleaning. He mentions difficulty paying bills.   Patient continues to have hand tremors. He has not taken primidone  recently. He continues to be on Warfarin.  I recommended restarting primidone  25 mg daily for essential tremor on 01/05/23.  07/13/23: Patient is having diffuse pain. He is taking chronic opioids. He sees [facility] pain management for this. He continues to take gabapentin  900 mg TID as well.   He does not walk much due to pain and weakness. He falls or passes out every day. He will be walking and feel like he is going to pass out. He has an Passenger transport manager. He finally got a ramp to get into his house. There is not a lot of room in his house to get the chair around though. He has not gotten the work done yet.   Home healthcare came out for about 6-8 weeks. He did not feel like the therapy helped much.   He stopped taking primidone  because it was making him sleep too much. He feels his tremor is worse than ever. He has difficulty eating and drinking due to this. Given his other medical problems, he is not interested in  another medication currently.   He is also currently being worked up for potential liver cancer per patient. He also has enlarged prostate and enlarged spleen.  Most recent Assessment and Plan (07/13/23): This is [name], a 68 y.o. male with: Numbness, tingling, pain, and weakness - These symptoms are likely multifactorial with contributions from arthritis, cervical and lumbar spine disease, and polyneuropathy. His known risk factors for PN are DM, EtOH abuse, and B1 deficiency. His weakness is also made worse by deconditioning. Essential tremor - Patient does not want take primidone  long because he felt sleepy  on it. He is already on gabapentin  and propranolol  would not be a good option given his other cardiac medications. We could consider topamax, but patient is not interested in more medications currently.   Plan: -Continue gabapentin  900 mg TID -Follow up [facility] pain management -Discussed importance of DM control and affect of CKD and liver disease on PN -Discussed treatments for ET, patient prefers to defer for now -Lidocaine  cream PRN -B1 (thiamine) 100 mg daily -Fall precautions discussed  Since their last visit: Patient called on 08/28/23 about feeling lightheaded and feeling like he was passing out. This was after a hospitalization from 08/18/23 to 08/21/23 for metabolic encephalopathy. This was suspected to be due to polypharmacy, dehydration, hypotension, and acute on chronic kidney disease. His percocet was reduced in the hospital. He was having a lot of pain and dizziness. I recommended patient return to be evaluated but also discuss with his PCP. ***  ROS: Pertinent positive and negative systems reviewed in HPI. ***   MEDICATIONS:  Outpatient Encounter Medications as of 12/29/2023  Medication Sig Note   albuterol  (PROVENTIL ) (2.5 MG/3ML) 0.083% nebulizer solution Take 3 mLs (2.5 mg total) by nebulization every 6 (six) hours as needed for wheezing or shortness of  breath.    albuterol  (VENTOLIN  HFA) 108 (90 Base) MCG/ACT inhaler INHALE 1-2 PUFFS INTO THE LUNGS EVERY 6 HOURS AS NEEDED FOR WHEEZING OR SHORTNESS OF BREATH    b complex vitamins tablet Take 1 tablet by mouth every Monday, Wednesday, and Friday.    bisoprolol  (ZEBETA ) 10 MG tablet Take 1 tablet (10 mg total) by mouth daily.    clobetasol  cream (TEMOVATE ) 0.05 % Apply 1 Application topically 2 (two) times daily as needed (Dermatitis).    clotrimazole -betamethasone  (LOTRISONE ) cream Apply 1 Application topically daily as needed (Fungus).    colchicine  0.6 MG tablet Take 1 tablet (0.6 mg total) by mouth daily.    Continuous Glucose Sensor (DEXCOM G7 SENSOR) MISC APPLY 1 SENSOR EVERY 10 DAYS    famotidine  (PEPCID ) 20 MG tablet One after supper    febuxostat  (ULORIC ) 40 MG tablet TAKE 1 TABLET BY MOUTH DAILY    Fluticasone -Umeclidin-Vilant (TRELEGY ELLIPTA ) 100-62.5-25 MCG/ACT AEPB INHALE 1 PUFF BY MOUTH DAILY    gabapentin  (NEURONTIN ) 300 MG capsule Take 3 capsules (900 mg total) by mouth 2 (two) times daily.    Glucagon  3 MG/DOSE POWD Place 3 mg into the nose once as needed for up to 1 dose.    Insulin  Disposable Pump (V-GO 20) 20 UNIT/24HR KIT 1 each by Does not apply route daily.    insulin  glargine (LANTUS  SOLOSTAR) 100 UNIT/ML Solostar Pen Inject 75 Units into the skin daily. 08/19/2023: Patient confirms injecting. Both patient and spouse cannot confirm number of units.    insulin  regular human CONCENTRATED (HUMULIN  R U-500 KWIKPEN) 500 UNIT/ML KwikPen Use up to 250-260 units of insulin  daily as advised 08/19/2023: Patient confirms injecting. Both patient and spouse cannot confirm number of units.    insulin  regular human CONCENTRATED (HUMULIN  R) 500 UNIT/ML injection Use 280 units of insulin  a day in the VGo pump.    ketoconazole  (NIZORAL ) 2 % cream Apply 1 Application topically daily.    lactulose , encephalopathy, (ENULOSE ) 10 GM/15ML SOLN TAKE 15 MLS BY MOUTH 3 TIMES DAILY AS DIRECTED     Meclizine HCl 25 MG CHEW Chew 1 tablet by mouth 3 (three) times daily as needed (Dizziness).    metolazone  (ZAROXOLYN ) 2.5 MG tablet Take 1 tablet (2.5 mg total) by  mouth as needed.    nicotine  (NICODERM CQ  - DOSED IN MG/24 HOURS) 21 mg/24hr patch Place 1 patch (21 mg total) onto the skin daily.    nitroGLYCERIN  (NITROSTAT ) 0.4 MG SL tablet DISSOLVE 1 TABLET UNDER THE TONGUE EVERY 5 MINUTES AS NEEDED FOR CHEST PAIN. DO NOT EXCEED A TOTAL OF 3 DOSES IN 15 MINUTES.    oxyCODONE -acetaminophen  (PERCOCET) 10-325 MG tablet Take 1 tablet by mouth every 4 (four) hours.    potassium chloride  SA (KLOR-CON  M) 20 MEQ tablet Take 1 tablet (20 mEq total) by mouth daily. TAKE AN EXTRA TABLET ON TUESDAY AND THURSDAY    predniSONE  (DELTASONE ) 20 MG tablet Take number 2 pills (40 mg) once daily by mouth for 3 days.  Then take number 1 pill (20 mg) once daily by mouth for 3 days.    rosuvastatin  (CRESTOR ) 20 MG tablet Take 20 mg by mouth at bedtime. 08/19/2023: Wife cannot verify dose increase.    spironolactone  (ALDACTONE ) 25 MG tablet Take 0.5 tablets (12.5 mg total) by mouth daily.    tacrolimus  (PROTOPIC ) 0.1 % ointment Apply 1 Application topically 2 (two) times daily as needed (Dermatitis).    tamsulosin  (FLOMAX ) 0.4 MG CAPS capsule TAKE 1 CAPSULE BY MOUTH EACH NIGHT AT BEDTIME    torsemide  (DEMADEX ) 20 MG tablet TAKE 2 TABELTS BY MOUTH DAILY IN THE MORNING AND 1 IN THE EVENING    triamcinolone  cream (KENALOG ) 0.1 % Apply 1 Application topically daily as needed.    VITAMIN A PO Take 2,400 mcg by mouth daily.     Vitamin D , Ergocalciferol , (DRISDOL ) 1.25 MG (50000 UNIT) CAPS capsule Take 1 capsule (50,000 Units total) by mouth every 7 (seven) days.    warfarin (COUMADIN ) 5 MG tablet Take 1/2 to 1 tablet daily or as directed 08/19/2023: @1900    No facility-administered encounter medications on file as of 12/29/2023.    PAST MEDICAL HISTORY: Past Medical History:  Diagnosis Date   Actinic keratosis    Arthritis     lower back   Asthma    Atrial fibrillation [facility])    Atrial flutter (HCC)    s/p CTI ablation by Dr Kelsie   Brain aneurysm 2009   questionable. A follow up CTA in 2009 showed no evidence of   Chronic back pain    DDD/stenosis   Colon polyps    9 polyps removed 10/13/11   Complication of anesthesia 09/11/2012   slow to awaken after ablation   COPD (chronic obstructive pulmonary disease) (HCC)    Diabetes mellitus    takes Metformin  and Glimepiride  daily   Emphysema    GERD (gastroesophageal reflux disease)    takes Omeprazole  bid   Heart failure (HCC)    History of shingles    HLD (hyperlipidemia)    takes Pravastatin  daily   HTN (hypertension)    takes Prinizide daily   Obesity    OSA (obstructive sleep apnea)    not always using cpap   Overdose 2009   unintentional Flecanide overdose   Peripheral neuropathy    Short-term memory loss    Tobacco abuse     PAST SURGICAL HISTORY: Past Surgical History:  Procedure Laterality Date   ANTERIOR CERVICAL DECOMP/DISCECTOMY FUSION  01/04/2012   Procedure: ANTERIOR CERVICAL DECOMPRESSION/DISCECTOMY FUSION 1 LEVEL/HARDWARE REMOVAL;  Surgeon: Reyes JONETTA Budge, MD;  Location: [facility] NEURO ORS;  Service: Neurosurgery;  Laterality: N/A;  explore cervical fusion Cervical six - seven  with removal of codman plate anterior cervical decompression with  fusion interbody prothesis plating and bonegraft   APPENDECTOMY  2-107yrs ago   ATRIAL FIBRILLATION ABLATION N/A 09/11/2012   PT DID NOT HAVE AN ATRIAL FIBRILLATION ABLATION IN 2014!  ATRIAL FLUTTER ABLATION ONLY   ATRIAL FLUTTER ABLATION  09/11/2012   CTI ablation by Dr Kelsie   BIOPSY  11/07/2019   Procedure: BIOPSY;  Surgeon: Teressa Toribio SQUIBB, MD;  Location: [facility] ENDOSCOPY;  Service: Endoscopy;;   CARDIAC CATHETERIZATION  2008   no significant CAD   CARDIOVERSION  05/05/2011   Procedure: CARDIOVERSION;  Surgeon: Redell GORMAN Shallow, MD;  Location: [facility] ENDOSCOPY;  Service: Cardiovascular;  Laterality:  N/A;   CARDIOVERSION Bilateral 07/26/2012   Procedure: CARDIOVERSION;  Surgeon: Lynwood Schilling, MD;  Location: [facility] ENDOSCOPY;  Service: Cardiovascular;  Laterality: Bilateral;   CARPAL TUNNEL RELEASE  99/2000   bilateral   COLONOSCOPY WITH PROPOFOL  N/A 12/13/2012   Procedure: COLONOSCOPY WITH PROPOFOL ;  Surgeon: Toribio SQUIBB Teressa, MD;  Location: [facility] ENDOSCOPY;  Service: Endoscopy;  Laterality: N/A;   ELECTROPHYSIOLOGIC STUDY N/A 04/21/2015   Procedure: Atrial Fibrillation Ablation;  Surgeon: Lynwood Kelsie, MD;  Location: [facility] INVASIVE CV LAB;  Service: Cardiovascular;  Laterality: N/A;   ESOPHAGOGASTRODUODENOSCOPY (EGD) WITH PROPOFOL  N/A 11/07/2019   Procedure: ESOPHAGOGASTRODUODENOSCOPY (EGD) WITH PROPOFOL ;  Surgeon: Teressa Toribio SQUIBB, MD;  Location: [facility] ENDOSCOPY;  Service: Endoscopy;  Laterality: N/A;   HERNIA REPAIR     KNEE ARTHROSCOPY WITH MEDIAL MENISECTOMY Left 04/23/2021   Procedure: LEFT KNEE ARTHROSCOPY WITH PARTIAL MEDIAL MENISCECTOMY;  Surgeon: Barbarann Oneil BROCKS, MD;  Location: [facility] OR;  Service: Orthopedics;  Laterality: Left;   KNEE SURGERY  6-64yrs ago   left   LEFT HEART CATH AND CORONARY ANGIOGRAPHY N/A 09/19/2016   Procedure: Left Heart Cath and Coronary Angiography;  Surgeon: Claudene Victory ORN, MD;  Location: [facility] INVASIVE CV LAB;  Service: Cardiovascular;  Laterality: N/A;   Left inguinal hernia repair     as a child   NASAL SEPTOPLASTY W/ TURBINOPLASTY Bilateral 02/19/2014   Procedure: NASAL SEPTOPLASTY WITH BILATERAL TURBINATE REDUCTION;  Surgeon: Marlyce Finer, MD;  Location: [facility] OR;  Service: ENT;  Laterality: Bilateral;   NECK SURGERY  10yrs ago   right shoulder surgery  4-77yrs ago   cyst removed   TEE WITHOUT CARDIOVERSION  05/05/2011   Procedure: TRANSESOPHAGEAL ECHOCARDIOGRAM (TEE);  Surgeon: Redell GORMAN Shallow, MD;  Location: [facility] ENDOSCOPY;  Service: Cardiovascular;  Laterality: N/A;   TEE WITHOUT CARDIOVERSION N/A 04/21/2015   Procedure: TRANSESOPHAGEAL ECHOCARDIOGRAM (TEE);  Surgeon: Wilbert JONELLE Bihari, MD;  Location: [facility] ENDOSCOPY;  Service: Cardiovascular;  Laterality: N/A;   THROAT SURGERY  4-35yrs ago   thought  it was cancer but came back not   TONSILLECTOMY      ALLERGIES: Allergies  Allergen Reactions   Adhesive [Tape]     itching   Latex Itching    When tape is on the skin too long skin gets red & itching    FAMILY HISTORY: Family History  Problem Relation Age of Onset   Diabetes Mother    Heart disease Father    Lung cancer Father    Diabetes Maternal Grandmother    Colon cancer Neg Hx    Anesthesia problems Neg Hx    Hypotension Neg Hx    Malignant hyperthermia Neg Hx    Pseudochol deficiency Neg Hx    Esophageal cancer Neg Hx    Pancreatic cancer Neg Hx    Stomach cancer Neg Hx     SOCIAL HISTORY: Social  History   Tobacco Use   Smoking status: Every Day    Current packs/day: 1.00    Average packs/day: 1 pack/day for 49.0 years (49.0 ttl pk-yrs)    Types: Cigarettes, E-cigarettes   Smokeless tobacco: Never   Tobacco comments:    Smoking almost a pack per day Patient is vaping daily.  09/28/2022 am a pack of cigs in 4days 01/05/23  Vaping Use   Vaping status: Every Day  Substance Use Topics   Alcohol use: Not Currently    Alcohol/week: 0.0 standard drinks of alcohol    Comment: 12/05/17 pt stated he has drinked 1/2 drink in 3 years   Drug use: No   Social History   Social History Narrative   Daily caffeine(Mountain Dew) Lives in [place] with spouse.Unemployed due to chronic back/ leg pain         Are you right handed or left handed? Left Handed    Are you currently employed ? No    What is your current occupation? No   Do you live at home alone? no   Who lives with you? Lives with wife and 3 doggies   What type of home do you live in: 1 story or 2 story? Lives in one story home        Objective:  Vital Signs:  There were no vitals taken for this visit.  General:*** General appearance: Awake and alert. No distress.  Cooperative with exam.  Skin: No obvious rash or jaundice. HEENT: Atraumatic. Anicteric. Lungs: Non-labored breathing on room air  Heart: Regular Abdomen: Soft, non tender. Extremities: No edema. No obvious deformity.  Musculoskeletal: No obvious joint swelling.  Neurological: Mental Status: Alert. Speech fluent. No pseudobulbar affect Cranial Nerves: CNII: No RAPD. Visual fields intact. CNIII, IV, VI: PERRL. No nystagmus. EOMI. CN V: Facial sensation intact bilaterally to fine touch. Masseter clench strong. Jaw jerk***. CN VII: Facial muscles symmetric and strong. No ptosis at rest or after sustained upgaze***. CN VIII: Hears finger rub well bilaterally. CN IX: No hypophonia. CN X: Palate elevates symmetrically. CN XI: Full strength shoulder shrug bilaterally. CN XII: Tongue protrusion full and midline. No atrophy or fasciculations. No significant dysarthria*** Motor: Tone is ***. *** fasciculations in *** extremities. *** atrophy. No grip or percussive myotonia.  Individual muscle group testing (MRC grade out of 5):  Movement     Neck flexion ***    Neck extension ***     Right Left   Shoulder abduction *** ***   Shoulder adduction *** ***   Shoulder ext rotation *** ***   Shoulder int rotation *** ***   Elbow flexion *** ***   Elbow extension *** ***   Wrist extension *** ***   Wrist flexion *** ***   Finger abduction - FDI *** ***   Finger abduction - ADM *** ***   Finger extension *** ***   Finger distal flexion - 2/3 *** ***   Finger distal flexion - 4/5 *** ***   Thumb flexion - FPL *** ***   Thumb abduction - APB *** ***    Hip flexion *** ***   Hip extension *** ***   Hip adduction *** ***   Hip abduction *** ***   Knee extension *** ***   Knee flexion *** ***   Dorsiflexion *** ***   Plantarflexion *** ***   Inversion *** ***   Eversion *** ***   Great toe extension *** ***   Great toe flexion *** ***  Reflexes:  Right Left  Bicep ***  ***  Tricep *** ***  BrRad *** ***  Knee *** ***  Ankle *** ***   Pathological Reflexes: Babinski: *** response bilaterally*** Hoffman: *** Troemner: *** Pectoral: *** Palmomental: *** Facial: *** Midline tap: *** Sensation: Pinprick: *** Vibration: *** Temperature: *** Proprioception: *** Coordination: Intact finger-to- nose-finger and heel-to-shin bilaterally. Romberg negative.*** Gait: Able to rise from chair with arms crossed unassisted. Normal, narrow-based gait. Able to tandem walk. Able to walk on toes and heels.***   Lab and Test Review: New results: 10/23/23 external labs: CMP significant for glucose 269, BUN 29, Cr 2.11, K 3.3 CBC significant for Hb 10.3, plts 83  08/19/23: TSH wnl B12: 1043 HbA1c: 9.4 Ammonia: 42  CT head wo contrast (08/18/23): IMPRESSION: Negative non contrasted CT appearance of the brain.  Previously reviewed results: 06/26/23 (external): CMP significant for glucose 239, Cr 1.94, alk phos 159 CBC w/ diff: Hb 10.7   06/06/23 (external): B12: 791 Lipid panel: tChol 106, LDL 48, TG 285 TSH wnl HbA1c: 8.8   TSH (08/01/22) wnl   B12 (external - 12/22/22): 917 HbA1c (external - 12/22/22): 7.4 Lipid panel (external - 12/22/22): Total cholesterol 95, TG 286, LDL 37 CMP (external - 12/22/22): significant for K 3.2, glucose 155, Cr 1.54   03/31/22: IFE and SPEP with no M protein CK: 31 B1: < 6   HbA1c (06/28/22): 7.6   Normal or unremarkable: CRP, B12 (841), folate, RF, TSH ANA negative on 08/12/21, positive on 10/22/20, negative on 12/26/17 INR (03/24/22): 2.2 CBC (03/01/22): significant for anemia (Hb 11.2), thrombocytopenia (141) CMP (03/01/22): significant for elevated glucose, elevated alk phos (121), Cr 1.5 ESR (03/01/22): elevated to 67 HbA1c (11/30/21): 7.7   EMG (08/25/20, Dr. Eldonna): EMG & NCV Findings: Evaluation of the left fibular motor nerve showed prolonged distal onset latency (12.2 ms) and reduced amplitude (1.9 mV).  The left  tibial motor nerve showed prolonged distal onset latency (6.2 ms), reduced amplitude (0.6 mV), and decreased conduction velocity (Knee-Ankle, 31 m/s).  The left saphenous sensory nerve showed prolonged distal peak latency (8.1 ms).  The left superficial fibular sensory nerve showed no response (14 cm).  The left sural sensory nerve showed no response (Calf).     Needle evaluation of the left anterior tibialis and the left Fibularis Longus muscles showed increased insertional activity, increased spontaneous activity, and diminished recruitment.  The left medial gastrocnemius muscle showed increased insertional activity, moderately increased spontaneous activity, and diminished recruitment.  All remaining muscles (as indicated in the following table) showed no evidence of electrical instability.     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  He carries a diagnosis of polyneuropathy but has not had electrodiagnostic studies.  For proven full characterization of the neuropathy referral to a neurologist for a full 3 limb study could be performed.  The needle EMG findings could be consistent with a lumbar stenosis but typically the level of denervation seen in motor unit changes would be a fairly severe stenosis and his latest imaging shows moderate narrowing.  He could be getting more pain from the spine on the left more than right but that is not confirmed or elucidated with this testing.   There is no significant electrodiagnostic evidence of any other focal nerve entrapment or lumbosacral plexopathy.    MRI lumbar spine wo contrast (03/01/20): FINDINGS: Segmentation:  Standard.  Alignment:  Straightening of lordosis.  Grade 1 L3-4 retrolisthesis.   Vertebrae: Multilevel Modic type 2 endplate degenerative changes. Multilevel Schmorl's node  formation. Scattered hemangiomata versus focal fat.   Conus medullaris and cauda equina: Conus extends to the L1 level. Conus and cauda equina appear normal.   Disc levels: Multilevel desiccation disc space loss most prominent at the L3-4 and L5-S1 levels.   L1-2: Disc bulge with small central protrusion, ligamentum flavum and bilateral facet hypertrophy. Mild spinal canal and bilateral neural foraminal narrowing.   L2-3: Disc bulge, ligamentum flavum and bilateral facet hypertrophy. Mild spinal canal, moderate right and mild left neural foraminal narrowing.   L3-4: Disc bulge abutting the ventral thecal sac with prominent subarticular components effacing the lateral recess. There is abutment of the exiting L3 and descending L4 nerve roots. Bilateral facet hypertrophy. Moderate spinal canal and bilateral neural foraminal narrowing, unchanged.   L4-5: Disc bulge and bilateral facet hypertrophy. Small left foraminal protrusion grazing the exiting L4 nerve root. Patent spinal canal. Moderate bilateral neural foraminal narrowing.   L5-S1: Bilateral pars defects. Disc bulge with superimposed central protrusion/annular fissuring. Bilateral facet hypertrophy. Patent spinal canal. Mild bilateral neural foraminal narrowing.   Paraspinal and other soft tissues: Bilateral renal cysts.   IMPRESSION: Multilevel spondylosis, grossly unchanged.   Moderate L3-4 and mild L1-3 spinal canal narrowing.   Moderate right L2-3 and bilateral L3-5 neural foraminal narrowing.   Mild bilateral L1-2, L5-S1 neural foraminal narrowing.   MRI cervical spine (10/31/11): Findings: Scan extends from the mid clivus through T1-2. Normal  paraspinal soft tissues. Visualized intracranial contents are  normal.   The patient has a solid anterior cervical fusion from C4-C6.  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  The cervical  spinal cord  is otherwise normal.   C1-2 and C2-3:  Normal.   C3-4:  Small slightly more prominent broad-based disc bulge with  prominent uncinate spurring to the right and left with bilateral  foraminal narrowing, unchanged.   C4-5 and C5-6:  Solid anterior fusion with no residual impingement.  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   C6-7:  Large new soft disc protrusion and extrusion into the left  lateral recess and left neural foramen affecting the left C7 nerve.  The extruded material measures 12 x 11 x 9 mm.  It touches the left  side of the spinal cord in the left lateral recess but does not  compress it.   C7-T1 and T1-2:  Normal.   IMPRESSION:  1. Large soft disc extrusion and protrusion into the left neural  foramen and left lateral recess at C6-7 compressing the left C7  nerve.  2. 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  3.  Slight increased broad-based disc bulge at C3-4 without focal  neural impingement.    CT head wo contrast (10/26/22): FINDINGS: Brain: No evidence of acute infarction, hemorrhage, hydrocephalus, extra-axial collection or mass lesion/mass effect.   Vascular: No hyperdense vessel or unexpected calcification.   Skull: Normal. Negative for fracture or focal lesion.   Sinuses/Orbits: No acute finding.   Other: None.   IMPRESSION: No acute intracranial abnormality noted.   Swallow evaluation (08/03/22): Clinical Impression: Clinical Impression: Patient presents with a very mild oropharyngeal dysphagia resulting only in bolus residuals of mechanical soft solids remaining on tongue base after initial swallow. Patient does have sensation to this and  was able to clear with sip of thin liquid barium. No penetration, aspiration or pharyngeal residuals observed with any of the tested barium consistencies (nectar and honey thick, thin, 13 mm barium tablet, mechanical soft solid). Appearance of bulging of posterior  pharynx observed which did not appear to significantly impede bolus transit. Patient able to swallow 13mm barium tablet without any liquid. Esophageal sweep did not reveal any barium stasis and tablet transited without difficulty. SLP recommended patient alternate bites of solids with sips of liquids and to avoid foods that are difficult to masticate (he has no teeth and no dentures) and avoid foods that are very dry. (he complains mostly of difficulty with foods such as breads) No further SLP intervention or evaluation warranted at this time.  ASSESSMENT: This is [name], a 68 y.o. male with:  ***  Plan: ***  Return to clinic in ***  Total time spent reviewing records, interview, history/exam, documentation, and coordination of care on day of encounter:  *** min  Venetia Potters, MD

## 2023-12-27 ENCOUNTER — Ambulatory Visit: Payer: Self-pay | Admitting: Internal Medicine

## 2023-12-27 NOTE — Telephone Encounter (Signed)
 Patient states he will get his wife to drop him off at the Emergency Room this afternoon.  He was advised that the recommendation is that he goes to the ER as soon as possible.   FYI Only or Action Required?: Action required by provider: update on patient condition.  Patient is followed in Pulmonology for COPD, last seen on 06/29/2023 by Parrett, Madelin RAMAN, NP.  Called Nurse Triage reporting Shortness of Breath.  Symptoms began over a week ago.  Interventions attempted: Rescue inhaler, Maintenance inhaler, and Nebulizer treatments.  Symptoms are: rapidly worsening.  Triage Disposition: Go to ED Now (or PCP Triage)  Patient/caregiver understands and will follow disposition?: Unsure                Copied from CRM [id number]. Topic: Clinical - Red Word Triage >> Dec 27, 2023 10:01 AM Russell PARAS wrote: Red Word that prompted transfer to Nurse Triage:  Thinks he has pneumonia Symptoms started a week ago Wet cough, with large amounts of mucus. Difficulty coughing up phlegm due to thickness Wheezing and SOB Chest pain  Difficulty breathing PCP advised he get oxygen  Oxygen  levels will dip and then return to normal.  Pt of Dr. Darlean. Reason for Disposition  Patient sounds very sick or weak to the triager  Answer Assessment - Initial Assessment Questions Patient thinks he might have pneumonia Pain in chest when taking a deep breath Coughing up a lot of mucous Short of breath Patient states he also has been coughing up brownish-gray mucous Patient advised that the recommendation at this time is the Emergency Room.  He states that he is nervous about the Emergency Room due to a past experience He states that his wife has to be somewhere this afternoon and he can get her to drop him off---he is advised the it is recommended that he go to the Emergency Room as soon as possible. He is also advised that if anything worsens to call 911 Patient verbalized understanding   E2C2  Pulmonary Triage - Initial Assessment Questions Chief Complaint (e.g., cough, sob, wheezing, fever, chills, sweat or additional symptoms) *Go to specific symptom protocol after initial questions. Shortness of breath, pain with a deep breath, coughing up a lot of mucous  How long have symptoms been present? Over a week  Have you tested for COVID or Flu? Note: If not, ask patient if a home test can be taken. If so, instruct patient to call back for positive results. No  MEDICINES:    Have you used your inhalers/maintenance medication? Yes If yes, What medications? Albuterol  Nebulizer--Take 3 mLs (2.5 mg total) by nebulization every 6 (six) hours as needed for wheezing or shortness of breath.   Albuterol  Inhaler--- INHALE 1-2 PUFFS INTO THE LUNGS EVERY 6 HOURS AS NEEDED FOR WHEEZING OR SHORTNESS OF BREATH   Trelegy Ellipta -- INHALE 1 PUFF BY MOUTH DAILY   If inhaler, ask How many puffs and how often? Note: Review instructions on medication in the chart. Albuterol  Nebulizer--Take 3 mLs (2.5 mg total) by nebulization every 6 (six) hours as needed for wheezing or shortness of breath.   Albuterol  Inhaler--- INHALE 1-2 PUFFS INTO THE LUNGS EVERY 6 HOURS AS NEEDED FOR WHEEZING OR SHORTNESS OF BREATH   Trelegy Ellipta -- INHALE 1 PUFF BY MOUTH DAILY   OXYGEN : Do you wear supplemental oxygen ? No If yes, How many liters are you supposed to use? ---  Do you monitor your oxygen  levels? Yes If yes, What is your reading (oxygen  level)  today? Patient and his wife state that his oxygen  level is 97% during triage--patient is with his wife at this time  What is your usual oxygen  saturation reading?  (Note: Pulmonary O2 sats should be 90% or greater) Lately it can be around 80%      3. PATTERN Does the difficult breathing come and go, or has it been constant since it started?      constant 4. SEVERITY: How bad is your breathing? (e.g., mild, moderate, severe)       Patient states his oxygen  level has dropped in the 80s at times and his PCP told him he needed to be on oxygen  5. RECURRENT SYMPTOM: Have you had difficulty breathing before? If Yes, ask: When was the last time? and What happened that time?      --- 6. CARDIAC HISTORY: Do you have any history of heart disease? (e.g., heart attack, angina, bypass surgery, angioplasty)      Patient has a cardiologist 7. LUNG HISTORY: Do you have any history of lung disease?  (e.g., pulmonary embolus, asthma, emphysema)     COPD 8. CAUSE: What do you think is causing the breathing problem?      ----- 9. OTHER SYMPTOMS: Do you have any other symptoms? (e.g., chest pain, cough, dizziness, fever, runny nose)     Productive cough with brownish gray mucous  Protocols used: Breathing Difficulty-A-AH

## 2023-12-28 NOTE — Progress Notes (Signed)
 I saw KIPPER BUCH in neurology clinic on 12/29/23 in follow up for neuropathy.  HPI: GERMAINE SHENKER is a 68 y.o. 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  To briefly review: Initial consultation 03/31/22: Patient started noticing symptoms 2-2.5 years ago. He relates that his symptoms started having muscle loss, weakness, and tremors. He states prior to this, he was strong. He still tries to stay active, but his muscle loss has slowed him down. He has tingling and numbness in bilateral legs and hands.   He uses a cane to ambulate mostly, but sometimes also uses a walker. He falls often. He mentions that when he is just standing, he tends to get off balance. He falls about once per month. If he is walking, he does not have problems. If he tries to look up or down or turns, that is most likely time to fall. He does occasionally freeze when walking.   Patient mentions he has had a tremor in his left hand since 1998 after his cervical spine fusion. He mentions now that he has tremors all over. His body will jump and almost throw him out of a chair. He mentions that when he is more nervous his tremors worsen.   He relates a long history of neck and back problems. He mentions lower back pain since the age of 59. He also states his cervical spine is worn out.    The patient denies symptoms suggestive of oculobulbar weakness including diplopia, ptosis, poor saliva control, dysarthria/dysphonia, impaired mastication, facial weakness/droop.    He does sometimes have difficulty getting food down and things will get stuck. Sometimes he has to swallow liquids twice. Per patient, he has something growing in his neck. I do not see notes about this.   Patient has COPD and chronic shortness of breath and has had  difficulty laying on his back due to shortness of breath for about 25 years.   Pseudobulbar affect is absent.   Patient has lost 15-20 pounds over the last 2 years without trying. He has put some weight back on lately.   Patient takes gabapentin  900 mg BID for neuropathy. The pain in his legs can be very severe, especially at night.   Of note, patient is on warfarin for afib.   Patient was on pravastatin  but stopped recently due to weakness. He has been off of it for 1-2 weeks. He thinks it may have helped, but is not sure.   Patient was sent to PT. He went once in the summer of 2023. He stopped due to pain.   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 Patient thinks his tremor would go away years ago after drinking. Restrictive diet? No Family history of neuropathy/myopathy/NM disease? Son has tremors   07/06/22: Blood work was significant for low B1. I recommended supplementation with 100 mg daily on 04/11/22. He takes B vitamin but is not sure if he is taking B1.   He continues to have a lot a pain. He has pain in neck, back, and legs. The majority of his pain is in his back. He has seen spine surgery in the past and was told there was nothing they could do as his heart doctor would not clear him. I recommended  MRI cervical spine at last visit, but patient has not gotten it. He said he tried to call but no one responded to his calls about this.   He feels weaker and like he may be headed toward a wheelchair. He just got a new shower that will have a seat and bars. He is getting this installed soon.   His tremor is about the same as prior. He was unable to use the primidone  as it would make him sleep too much.   He still has difficulty occasionally with swallowing. He has seen ENT in the past and was told he had a worm like thing in throat that hangs up his food. He does not have much difficulty with liquids. He has not had a swallow evaluation.    01/05/23: Patient has significant back pain and did not think he could tolerate the MRIs, so he never got them done. He continues to have a lot of back pain. He recently was given a Press photographer, which he is happy about and plans to use.   He has had some episodes of near syncope or syncope. He had his BP medication adjusted that has really helped him with this. Patient has had frequent falls, up to 3-4 times per week. He describes back pain and his right leg giving out. He had a CT head on 10/26/22 that showed no acute process. Patient is not sure if home PT has been ordered.   Swallow evaluation on 08/03/22 was essentially unremarkable. Patient continues to complain of getting food stuck in his throat with a lot of acid reflux and belching.   Patient mentions he and his wife's health is so poor that they have difficulty doing things around the house, such as cleaning. He mentions difficulty paying bills.   Patient continues to have hand tremors. He has not taken primidone  recently. He continues to be on Warfarin.  I recommended restarting primidone  25 mg daily for essential tremor on 01/05/23.  07/13/23: Patient is having diffuse pain. He is taking chronic opioids. He sees [facility] pain management for this. He continues to take gabapentin  900 mg TID as well.   He does not walk much due to pain and weakness. He falls or passes out every day. He will be walking and feel like he is going to pass out. He has an Passenger transport manager. He finally got a ramp to get into his house. There is not a lot of room in his house to get the chair around though. He has not gotten the work done yet.   Home healthcare came out for about 6-8 weeks. He did not feel like the therapy helped much.   He stopped taking primidone  because it was making him sleep too much. He feels his tremor is worse than ever. He has difficulty eating and drinking due to this. Given his other medical problems, he is not interested in  another medication currently.   He is also currently being worked up for potential liver cancer per patient. He also has enlarged prostate and enlarged spleen.  Most recent Assessment and Plan (07/13/23): This is BOONE GEAR, a 68 y.o. male with: Numbness, tingling, pain, and weakness - These symptoms are likely multifactorial with contributions from arthritis, cervical and lumbar spine disease, and polyneuropathy. His known risk factors for PN are DM, EtOH abuse, and B1 deficiency. His weakness is also made worse by deconditioning. Essential tremor - Patient does not want take primidone  long because he felt sleepy  on it. He is already on gabapentin  and propranolol  would not be a good option given his other cardiac medications. We could consider topamax, but patient is not interested in more medications currently.   Plan: -Continue gabapentin  900 mg TID -Follow up [facility] pain management -Discussed importance of DM control and affect of CKD and liver disease on PN -Discussed treatments for ET, patient prefers to defer for now -Lidocaine  cream PRN -B1 (thiamine) 100 mg daily -Fall precautions discussed  Since their last visit: Patient called on 08/28/23 about feeling lightheaded and feeling like he was passing out. This was after a hospitalization from 08/18/23 to 08/21/23 for metabolic encephalopathy. This was suspected to be due to polypharmacy, dehydration, hypotension, and acute on chronic kidney disease. His percocet was reduced in the hospital. He was having a lot of pain and dizziness. I recommended patient return to be evaluated but also discuss with his PCP.   12/27/23 he called PCP office with shortness of breath, chest pain, wheezing, and mucus. He was advised to go to the ED. He states this was due to COPD.  He is getting around with an electric scooter for using around the house. He cannot transport it though so he usually stays home. He does not get around well. His  neuropathy is still present. His gabapentin  is now 600 mg BID.  His tremors continue to get worse. Wife asks about treatment today.   MEDICATIONS:  Outpatient Encounter Medications as of 12/29/2023  Medication Sig Note   albuterol  (PROVENTIL ) (2.5 MG/3ML) 0.083% nebulizer solution Take 3 mLs (2.5 mg total) by nebulization every 6 (six) hours as needed for wheezing or shortness of breath.    albuterol  (VENTOLIN  HFA) 108 (90 Base) MCG/ACT inhaler INHALE 1-2 PUFFS INTO THE LUNGS EVERY 6 HOURS AS NEEDED FOR WHEEZING OR SHORTNESS OF BREATH    b complex vitamins tablet Take 1 tablet by mouth every Monday, Wednesday, and Friday.    bisoprolol  (ZEBETA ) 10 MG tablet Take 1 tablet (10 mg total) by mouth daily.    clobetasol  cream (TEMOVATE ) 0.05 % Apply 1 Application topically 2 (two) times daily as needed (Dermatitis).    clotrimazole -betamethasone  (LOTRISONE ) cream Apply 1 Application topically daily as needed (Fungus).    colchicine  0.6 MG tablet Take 1 tablet (0.6 mg total) by mouth daily.    Continuous Glucose Sensor (DEXCOM G7 SENSOR) MISC APPLY 1 SENSOR EVERY 10 DAYS    famotidine  (PEPCID ) 20 MG tablet One after supper    Fluticasone -Umeclidin-Vilant (TRELEGY ELLIPTA ) 100-62.5-25 MCG/ACT AEPB INHALE 1 PUFF BY MOUTH DAILY    Glucagon  3 MG/DOSE POWD Place 3 mg into the nose once as needed for up to 1 dose.    Insulin  Disposable Pump (V-GO 20) 20 UNIT/24HR KIT 1 each by Does not apply route daily.    insulin  glargine (LANTUS  SOLOSTAR) 100 UNIT/ML Solostar Pen Inject 75 Units into the skin daily. 08/19/2023: Patient confirms injecting. Both patient and spouse cannot confirm number of units.    insulin  regular human CONCENTRATED (HUMULIN  R U-500 KWIKPEN) 500 UNIT/ML KwikPen Use up to 250-260 units of insulin  daily as advised 08/19/2023: Patient confirms injecting. Both patient and spouse cannot confirm number of units.    ketoconazole  (NIZORAL ) 2 % cream Apply 1 Application topically daily.    lactulose ,  encephalopathy, (ENULOSE ) 10 GM/15ML SOLN TAKE 15 MLS BY MOUTH 3 TIMES DAILY AS DIRECTED    Meclizine HCl 25 MG CHEW Chew 1 tablet by mouth 3 (three) times daily as needed (  Dizziness).    nicotine  (NICODERM CQ  - DOSED IN MG/24 HOURS) 21 mg/24hr patch Place 1 patch (21 mg total) onto the skin daily.    oxyCODONE -acetaminophen  (PERCOCET) 10-325 MG tablet Take 1 tablet by mouth every 4 (four) hours.    potassium chloride  SA (KLOR-CON  M) 20 MEQ tablet Take 1 tablet (20 mEq total) by mouth daily. TAKE AN EXTRA TABLET ON TUESDAY AND THURSDAY    primidone  (MYSOLINE ) 50 MG tablet Take 0.5 tablets (25 mg total) by mouth in the morning and at bedtime.    rosuvastatin  (CRESTOR ) 20 MG tablet Take 20 mg by mouth at bedtime. 08/19/2023: Wife cannot verify dose increase.    spironolactone  (ALDACTONE ) 25 MG tablet Take 0.5 tablets (12.5 mg total) by mouth daily.    tacrolimus  (PROTOPIC ) 0.1 % ointment Apply 1 Application topically 2 (two) times daily as needed (Dermatitis).    tamsulosin  (FLOMAX ) 0.4 MG CAPS capsule TAKE 1 CAPSULE BY MOUTH EACH NIGHT AT BEDTIME    torsemide  (DEMADEX ) 20 MG tablet TAKE 2 TABELTS BY MOUTH DAILY IN THE MORNING AND 1 IN THE EVENING    triamcinolone  cream (KENALOG ) 0.1 % Apply 1 Application topically daily as needed.    VITAMIN A PO Take 2,400 mcg by mouth daily.     warfarin (COUMADIN ) 5 MG tablet Take 1/2 to 1 tablet daily or as directed 08/19/2023: @1900    [DISCONTINUED] gabapentin  (NEURONTIN ) 300 MG capsule Take 3 capsules (900 mg total) by mouth 2 (two) times daily.    febuxostat  (ULORIC ) 40 MG tablet TAKE 1 TABLET BY MOUTH DAILY (Patient not taking: Reported on 12/29/2023)    gabapentin  (NEURONTIN ) 300 MG capsule Take 2 capsules (600 mg total) by mouth 2 (two) times daily.    insulin  regular human CONCENTRATED (HUMULIN  R) 500 UNIT/ML injection Use 280 units of insulin  a day in the VGo pump.    metolazone  (ZAROXOLYN ) 2.5 MG tablet Take 1 tablet (2.5 mg total) by mouth as needed.  (Patient not taking: Reported on 12/29/2023)    nitroGLYCERIN  (NITROSTAT ) 0.4 MG SL tablet DISSOLVE 1 TABLET UNDER THE TONGUE EVERY 5 MINUTES AS NEEDED FOR CHEST PAIN. DO NOT EXCEED A TOTAL OF 3 DOSES IN 15 MINUTES. (Patient not taking: Reported on 12/29/2023)    predniSONE  (DELTASONE ) 20 MG tablet Take number 2 pills (40 mg) once daily by mouth for 3 days.  Then take number 1 pill (20 mg) once daily by mouth for 3 days. (Patient not taking: Reported on 12/29/2023)    Vitamin D , Ergocalciferol , (DRISDOL ) 1.25 MG (50000 UNIT) CAPS capsule Take 1 capsule (50,000 Units total) by mouth every 7 (seven) days. (Patient not taking: Reported on 12/29/2023)    No facility-administered encounter medications on file as of 12/29/2023.    PAST MEDICAL HISTORY: Past Medical History:  Diagnosis Date   Actinic keratosis    Arthritis    lower back   Asthma    Atrial fibrillation [facility])    Atrial flutter (HCC)    s/p CTI ablation by Dr Kelsie   Brain aneurysm 2009   questionable. A follow up CTA in 2009 showed no evidence of   Chronic back pain    DDD/stenosis   Colon polyps    9 polyps removed 10/13/11   Complication of anesthesia 09/11/2012   slow to awaken after ablation   COPD (chronic obstructive pulmonary disease) (HCC)    Diabetes mellitus    takes Metformin  and Glimepiride  daily   Emphysema    GERD (gastroesophageal reflux disease)  takes Omeprazole  bid   Heart failure (HCC)    History of shingles    HLD (hyperlipidemia)    takes Pravastatin  daily   HTN (hypertension)    takes Prinizide daily   Obesity    OSA (obstructive sleep apnea)    not always using cpap   Overdose 2009   unintentional Flecanide overdose   Peripheral neuropathy    Short-term memory loss    Tobacco abuse     PAST SURGICAL HISTORY: Past Surgical History:  Procedure Laterality Date   ANTERIOR CERVICAL DECOMP/DISCECTOMY FUSION  01/04/2012   Procedure: ANTERIOR CERVICAL DECOMPRESSION/DISCECTOMY FUSION 1  LEVEL/HARDWARE REMOVAL;  Surgeon: Reyes JONETTA Budge, MD;  Location: [facility] NEURO ORS;  Service: Neurosurgery;  Laterality: N/A;  explore cervical fusion Cervical six - seven  with removal of codman plate anterior cervical decompression with fusion interbody prothesis plating and bonegraft   APPENDECTOMY  2-88yrs ago   ATRIAL FIBRILLATION ABLATION N/A 09/11/2012   PT DID NOT HAVE AN ATRIAL FIBRILLATION ABLATION IN 2014!  ATRIAL FLUTTER ABLATION ONLY   ATRIAL FLUTTER ABLATION  09/11/2012   CTI ablation by Dr Kelsie   BIOPSY  11/07/2019   Procedure: BIOPSY;  Surgeon: Teressa Toribio SQUIBB, MD;  Location: [facility] ENDOSCOPY;  Service: Endoscopy;;   CARDIAC CATHETERIZATION  2008   no significant CAD   CARDIOVERSION  05/05/2011   Procedure: CARDIOVERSION;  Surgeon: Redell GORMAN Shallow, MD;  Location: [facility] ENDOSCOPY;  Service: Cardiovascular;  Laterality: N/A;   CARDIOVERSION Bilateral 07/26/2012   Procedure: CARDIOVERSION;  Surgeon: Lynwood Schilling, MD;  Location[facility] ENDOSCOPY;  Service: Cardiovascular;  Laterality: Bilateral;   CARPAL TUNNEL RELEASE  99/2000   bilateral   COLONOSCOPY WITH PROPOFOL  N/A 12/13/2012   Procedure: COLONOSCOPY WITH PROPOFOL ;  Surgeon: Toribio SQUIBB Teressa, MD;  Location: [facility] ENDOSCOPY;  Service: Endoscopy;  Laterality: N/A;   ELECTROPHYSIOLOGIC STUDY N/A 04/21/2015   Procedure: Atrial Fibrillation Ablation;  Surgeon: Lynwood Kelsie, MD;  Location: [facility] INVASIVE CV LAB;  Service: Cardiovascular;  Laterality: N/A;   ESOPHAGOGASTRODUODENOSCOPY (EGD) WITH PROPOFOL  N/A 11/07/2019   Procedure: ESOPHAGOGASTRODUODENOSCOPY (EGD) WITH PROPOFOL ;  Surgeon: Teressa Toribio SQUIBB, MD;  Location: [facility] ENDOSCOPY;  Service: Endoscopy;  Laterality: N/A;   HERNIA REPAIR     KNEE ARTHROSCOPY WITH MEDIAL MENISECTOMY Left 04/23/2021   Procedure: LEFT KNEE ARTHROSCOPY WITH PARTIAL MEDIAL MENISCECTOMY;  Surgeon: Barbarann Oneil BROCKS, MD;  Location: [facility] OR;  Service: Orthopedics;  Laterality: Left;   KNEE SURGERY  6-25yrs ago   left   LEFT HEART  CATH AND CORONARY ANGIOGRAPHY N/A 09/19/2016   Procedure: Left Heart Cath and Coronary Angiography;  Surgeon: Claudene Victory ORN, MD;  Location: [facility] INVASIVE CV LAB;  Service: Cardiovascular;  Laterality: N/A;   Left inguinal hernia repair     as a child   NASAL SEPTOPLASTY W/ TURBINOPLASTY Bilateral 02/19/2014   Procedure: NASAL SEPTOPLASTY WITH BILATERAL TURBINATE REDUCTION;  Surgeon: Marlyce Finer, MD;  Location[facility] OR;  Service: ENT;  Laterality: Bilateral;   NECK SURGERY  41yrs ago   right shoulder surgery  4-7yrs ago   cyst removed   TEE WITHOUT CARDIOVERSION  05/05/2011   Procedure: TRANSESOPHAGEAL ECHOCARDIOGRAM (TEE);  Surgeon: Redell GORMAN Shallow, MD;  Location: [facility] ENDOSCOPY;  Service: Cardiovascular;  Laterality: N/A;   TEE WITHOUT CARDIOVERSION N/A 04/21/2015   Procedure: TRANSESOPHAGEAL ECHOCARDIOGRAM (TEE);  Surgeon: Wilbert JONELLE Bihari, MD;  Location: [facility] ENDOSCOPY;  Service: Cardiovascular;  Laterality: N/A;   THROAT SURGERY  4-78yrs ago   thought  it was cancer  but came back not   TONSILLECTOMY      ALLERGIES: Allergies  Allergen Reactions   Adhesive [Tape]     itching   Latex Itching    When tape is on the skin too long skin gets red & itching    FAMILY HISTORY: Family History  Problem Relation Age of Onset   Diabetes Mother    Heart disease Father    Lung cancer Father    Diabetes Maternal Grandmother    Colon cancer Neg Hx    Anesthesia problems Neg Hx    Hypotension Neg Hx    Malignant hyperthermia Neg Hx    Pseudochol deficiency Neg Hx    Esophageal cancer Neg Hx    Pancreatic cancer Neg Hx    Stomach cancer Neg Hx     SOCIAL HISTORY: Social History   Tobacco Use   Smoking status: Every Day    Current packs/day: 1.00    Average packs/day: 1 pack/day for 49.0 years (49.0 ttl pk-yrs)    Types: Cigarettes, E-cigarettes   Smokeless tobacco: Never   Tobacco comments:    Smoking almost a pack per day Patient is vaping daily.  09/28/2022 am a pack of cigs in 4days  01/05/23  Vaping Use   Vaping status: Every Day  Substance Use Topics   Alcohol use: Not Currently    Alcohol/week: 0.0 standard drinks of alcohol    Comment: 12/05/17 pt stated he has drinked 1/2 drink in 3 years   Drug use: No   Social History   Social History Narrative   Daily caffeine(Mountain Dew) Lives in [place] with spouse.Unemployed due to chronic back/ leg pain         Are you right handed or left handed? Left Handed    Are you currently employed ? No    What is your current occupation? No   Do you live at home alone? no   Who lives with you? Lives with wife and 3 doggies   What type of home do you live in: 1 story or 2 story? Lives in one story home        Objective:  Vital Signs:  BP 101/64 (BP Location: Left Arm, Patient Position: Sitting, Cuff Size: Large)   Pulse 76   Ht 6' 2 (1.88 m)   SpO2 95%   BMI 32.87 kg/m   General: General appearance: Lethargic. Appears in poor health.  Skin: No obvious rash or jaundice. HEENT: Atraumatic. Scleral icterus  Lungs: Non-labored breathing on room air  Heart: Regular Extremities: Weeping lesions of bilateral lower legs. Asterixis.  Neurological: Mental Status: Alert. Speech fluent. No pseudobulbar affect Cranial Nerves: CNII: No RAPD. Visual fields intact. CNIII, IV, VI: PERRL. No nystagmus. EOMI. CN V: Facial sensation intact bilaterally to fine touch. CN VII: Facial muscles symmetric and strong. No ptosis at rest CN VIII: Hears finger rub well bilaterally. CN IX: No hypophonia. CN X: Palate elevates symmetrically. CN XI: Full strength shoulder shrug bilaterally. CN XII: Tongue protrusion full and midline. No atrophy or fasciculations. No significant dysarthria Motor: Tone is normal. Tremor in hands but also intermittent jerking of arms, legs, or torso.  Individual muscle group testing (MRC grade out of 5):  Movement     Neck flexion 5    Neck extension 5     Right Left   Shoulder abduction 5-  5-   Elbow flexion 5 5   Elbow extension 5 5   Finger abduction - FDI 5-  5-   Finger abduction - ADM 5- 5-   Finger extension 5 5   Finger flexion 5 5    Hip flexion 3 3   Knee extension 5- 5-   Knee flexion 5- 5-   Dorsiflexion 5 5   Plantarflexion 5 5     Reflexes:  Right Left  Bicep Trace Trace  Tricep Trace Trace  BrRad Trace Trace  Knee 0 0  Ankle 0 0   Sensation: Pinprick: Absent in bilateral lower limbs to the knees and in bilateral hands Coordination: Finger-to- nose-finger with action tremor Gait: Unable to safely ambulate   Lab and Test Review: New results: 10/23/23 external labs: CMP significant for glucose 269, BUN 29, Cr 2.11, K 3.3, Alk phos 176 CBC significant for Hb 10.3, plts 83  08/19/23: TSH wnl B12: 1043 HbA1c: 9.4 Ammonia: 42  CT head wo contrast (08/18/23): IMPRESSION: Negative non contrasted CT appearance of the brain.  Previously reviewed results: 06/26/23 (external): CMP significant for glucose 239, Cr 1.94, alk phos 159 CBC w/ diff: Hb 10.7   06/06/23 (external): B12: 791 Lipid panel: tChol 106, LDL 48, TG 285 TSH wnl HbA1c: 8.8   TSH (08/01/22) wnl   B12 (external - 12/22/22): 917 HbA1c (external - 12/22/22): 7.4 Lipid panel (external - 12/22/22): Total cholesterol 95, TG 286, LDL 37 CMP (external - 12/22/22): significant for K 3.2, glucose 155, Cr 1.54   03/31/22: IFE and SPEP with no M protein CK: 31 B1: < 6   HbA1c (06/28/22): 7.6   Normal or unremarkable: CRP, B12 (841), folate, RF, TSH ANA negative on 08/12/21, positive on 10/22/20, negative on 12/26/17 INR (03/24/22): 2.2 CBC (03/01/22): significant for anemia (Hb 11.2), thrombocytopenia (141) CMP (03/01/22): significant for elevated glucose, elevated alk phos (121), Cr 1.5 ESR (03/01/22): elevated to 67 HbA1c (11/30/21): 7.7   EMG (08/25/20, Dr. Eldonna): EMG & NCV Findings: Evaluation of the left fibular motor nerve showed prolonged distal onset latency (12.2 ms) and reduced  amplitude (1.9 mV).  The left tibial motor nerve showed prolonged distal onset latency (6.2 ms), reduced amplitude (0.6 mV), and decreased conduction velocity (Knee-Ankle, 31 m/s).  The left saphenous sensory nerve showed prolonged distal peak latency (8.1 ms).  The left superficial fibular sensory nerve showed no response (14 cm).  The left sural sensory nerve showed no response (Calf).     Needle evaluation of the left anterior tibialis and the left Fibularis Longus muscles showed increased insertional activity, increased spontaneous activity, and diminished recruitment.  The left medial gastrocnemius muscle showed increased insertional activity, moderately increased spontaneous activity, and diminished recruitment.  All remaining muscles (as indicated in the following table) showed no evidence of electrical instability.     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  He carries a diagnosis of polyneuropathy but has not had electrodiagnostic studies.  For proven full characterization of the neuropathy referral to a neurologist for a full 3 limb study could be performed.  The needle EMG findings could be consistent with a lumbar stenosis but typically the level of denervation seen in motor unit changes would be a fairly severe stenosis and his latest imaging shows moderate narrowing.  He could be getting more pain from the spine on the left more than right but that is not confirmed or elucidated with this testing.   There is no significant electrodiagnostic evidence of any  other focal nerve entrapment or lumbosacral plexopathy.    MRI lumbar spine wo contrast (03/01/20): FINDINGS: Segmentation:  Standard.   Alignment:  Straightening of lordosis.  Grade 1 L3-4 retrolisthesis.   Vertebrae: Multilevel Modic type 2 endplate degenerative  changes. Multilevel Schmorl's node formation. Scattered hemangiomata versus focal fat.   Conus medullaris and cauda equina: Conus extends to the L1 level. Conus and cauda equina appear normal.   Disc levels: Multilevel desiccation disc space loss most prominent at the L3-4 and L5-S1 levels.   L1-2: Disc bulge with small central protrusion, ligamentum flavum and bilateral facet hypertrophy. Mild spinal canal and bilateral neural foraminal narrowing.   L2-3: Disc bulge, ligamentum flavum and bilateral facet hypertrophy. Mild spinal canal, moderate right and mild left neural foraminal narrowing.   L3-4: Disc bulge abutting the ventral thecal sac with prominent subarticular components effacing the lateral recess. There is abutment of the exiting L3 and descending L4 nerve roots. Bilateral facet hypertrophy. Moderate spinal canal and bilateral neural foraminal narrowing, unchanged.   L4-5: Disc bulge and bilateral facet hypertrophy. Small left foraminal protrusion grazing the exiting L4 nerve root. Patent spinal canal. Moderate bilateral neural foraminal narrowing.   L5-S1: Bilateral pars defects. Disc bulge with superimposed central protrusion/annular fissuring. Bilateral facet hypertrophy. Patent spinal canal. Mild bilateral neural foraminal narrowing.   Paraspinal and other soft tissues: Bilateral renal cysts.   IMPRESSION: Multilevel spondylosis, grossly unchanged.   Moderate L3-4 and mild L1-3 spinal canal narrowing.   Moderate right L2-3 and bilateral L3-5 neural foraminal narrowing.   Mild bilateral L1-2, L5-S1 neural foraminal narrowing.   MRI cervical spine (10/31/11): Findings: Scan extends from the mid clivus through T1-2. Normal  paraspinal soft tissues. Visualized intracranial contents are  normal.   The patient has a solid anterior cervical fusion from C4-C6.  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  The cervical spinal cord  is otherwise normal.   C1-2 and C2-3:  Normal.   C3-4:  Small slightly more prominent broad-based disc bulge with  prominent uncinate spurring to the right and left with bilateral  foraminal narrowing, unchanged.   C4-5 and C5-6:  Solid anterior fusion with no residual impingement.  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   C6-7:  Large new soft disc protrusion and extrusion into the left  lateral recess and left neural foramen affecting the left C7 nerve.  The extruded material measures 12 x 11 x 9 mm.  It touches the left  side of the spinal cord in the left lateral recess but does not  compress it.   C7-T1 and T1-2:  Normal.   IMPRESSION:  1. Large soft disc extrusion and protrusion into the left neural  foramen and left lateral recess at C6-7 compressing the left C7  nerve.  2. 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  3.  Slight increased broad-based disc bulge at C3-4 without focal  neural impingement.    CT head wo contrast (10/26/22): FINDINGS: Brain: No evidence of acute infarction, hemorrhage, hydrocephalus, extra-axial collection or mass lesion/mass effect.   Vascular: No hyperdense vessel or unexpected calcification.   Skull: Normal. Negative for fracture or focal lesion.   Sinuses/Orbits: No acute finding.   Other: None.   IMPRESSION: No acute intracranial abnormality noted.   Swallow evaluation (08/03/22): Clinical Impression: Clinical Impression: Patient presents with a very mild oropharyngeal dysphagia resulting  only in bolus residuals of mechanical soft solids remaining on tongue base after initial swallow. Patient does have sensation to this and was able to clear with sip of thin liquid barium. No penetration, aspiration or pharyngeal residuals observed with any of the tested barium consistencies (nectar and honey thick, thin, 13 mm barium tablet, mechanical soft solid).  Appearance of bulging of posterior pharynx observed which did not appear to significantly impede bolus transit. Patient able to swallow 13mm barium tablet without any liquid. Esophageal sweep did not reveal any barium stasis and tablet transited without difficulty. SLP recommended patient alternate bites of solids with sips of liquids and to avoid foods that are difficult to masticate (he has no teeth and no dentures) and avoid foods that are very dry. (he complains mostly of difficulty with foods such as breads) No further SLP intervention or evaluation warranted at this time.  ASSESSMENT: This is Garnette JONETTA Murrain, a 68 y.o. male with: Numbness, tingling, pain, and weakness - These symptoms are likely multifactorial with contributions from arthritis, cervical and lumbar spine disease, and polyneuropathy. His known risk factors for PN are DM, EtOH abuse, and B1 deficiency. His weakness is also made worse by deconditioning. Unfortunately his risk factors like DM are poorly controlled, so symptoms are worsening. I explained this to patient today. Tremor/myoclonic jerks - Patient had clearer essential tremor when I first started seeing him. He did not take primidone  long because he felt sleepy on it. He is already on gabapentin  and propranolol  would not be a good option given his other cardiac medications. Patient would like to try primidone  again today. I explained it could interact with Coumadin , but he will follow closely with Coumadin  clinic. I also explained that some of his shaking and jerking is likely due to metabolic causes (liver and kidney).  Overall, patient's health appears very poor. Patient, wife, and myself discussed this and that if his metabolic issues were not well address, if he did not watch his diet and control blood sugar, and if he does not quit smoking that his health is likely to continue to worsen. He follows up with PCP next week per wife where he is ready to discuss some of these  issues.  Plan: -Restart primidone  25 mg BID. Will need to follow closely with Coumadin  clinic given the known interaction -Continue gabapentin  600 mg BID -Follow up with pain management as planned -Continue B1 100 mg daily -Fall precautions discussed  Return to clinic in 6 months  Total time spent reviewing records, interview, history/exam, documentation, and coordination of care on day of encounter:  45 min  Venetia Potters, MD

## 2023-12-29 ENCOUNTER — Ambulatory Visit: Admitting: Neurology

## 2023-12-29 ENCOUNTER — Encounter: Payer: Self-pay | Admitting: Neurology

## 2023-12-29 VITALS — BP 101/64 | HR 76 | Ht 74.0 in

## 2023-12-29 DIAGNOSIS — N1832 Chronic kidney disease, stage 3b: Secondary | ICD-10-CM

## 2023-12-29 DIAGNOSIS — R2689 Other abnormalities of gait and mobility: Secondary | ICD-10-CM

## 2023-12-29 DIAGNOSIS — G250 Essential tremor: Secondary | ICD-10-CM | POA: Diagnosis not present

## 2023-12-29 DIAGNOSIS — Z794 Long term (current) use of insulin: Secondary | ICD-10-CM

## 2023-12-29 DIAGNOSIS — R278 Other lack of coordination: Secondary | ICD-10-CM | POA: Diagnosis not present

## 2023-12-29 DIAGNOSIS — E119 Type 2 diabetes mellitus without complications: Secondary | ICD-10-CM

## 2023-12-29 DIAGNOSIS — R269 Unspecified abnormalities of gait and mobility: Secondary | ICD-10-CM

## 2023-12-29 MED ORDER — GABAPENTIN 300 MG PO CAPS: 600.0000 mg | ORAL_CAPSULE | Freq: Two times a day (BID) | ORAL | 3 refills | Status: AC

## 2023-12-29 MED ORDER — PRIMIDONE 50 MG PO TABS: 25.0000 mg | ORAL_TABLET | Freq: Two times a day (BID) | ORAL | 3 refills | Status: AC

## 2023-12-29 NOTE — Patient Instructions (Signed)
 We will restart the medicine we previously gave you for tremors, primidone . You will take 1/2 tablet twice daily (25 mg twice daily). This can interact with Warfarin, so make sure they know at Coumadin  clinic. Some of your shaking is due to kidney and liver disease though, so we will see how much primidone  helps.  Continue gabapentin  600 mg twice daily for neuropathy.  You should work on improving your blood sugars or your neuropathy will continue to get worse.  Stop smoking if able as this would greatly improve your health.  I will see you again in 6 months. Please let me know if you have any questions or concerns in the meantime.  The physicians and staff at [facility] are committed to providing excellent care. You may receive a survey requesting feedback about your experience at our office. We strive to receive very good responses to the survey questions. If you feel that your experience would prevent you from giving the office a very good  response, please contact our office to try to remedy the situation. We may be reached at [phone]. Thank you for taking the time out of your busy day to complete the survey.  Ryan Potters, MD  Neurology  Preventing Falls at [facility] are common, often dreaded events in the lives of older people. Aside from the obvious injuries and even death that may result, fall can cause wide-ranging consequences including loss of independence, mental decline, decreased activity and mobility. Younger people are also at risk of falling, especially those with chronic illnesses and fatigue.  Ways to reduce risk for falling Examine diet and medications. Warm foods and alcohol dilate blood vessels, which can lead to dizziness when standing. Sleep aids, antidepressants and pain medications can also increase the likelihood of a fall.  Get a vision exam. Poor vision, cataracts and glaucoma increase the chances of falling.  Check foot gear. Shoes should  fit snugly and have a sturdy, nonskid sole and a broad, low heel  Participate in a physician-approved exercise program to build and maintain muscle strength and improve balance and coordination. Programs that use ankle weights or stretch bands are excellent for muscle-strengthening. Water aerobics programs and low-impact Tai Chi programs have also been shown to improve balance and coordination.  Increase vitamin D  intake. Vitamin D  improves muscle strength and increases the amount of calcium  the body is able to absorb and deposit in bones.  How to prevent falls from common hazards Floors - Remove all loose wires, cords, and throw rugs. Minimize clutter. Make sure rugs are anchored and smooth. Keep furniture in its usual place.  Chairs -- Use chairs with straight backs, armrests and firm seats. Add firm cushions to existing pieces to add height.  Bathroom - Install grab bars and non-skid tape in the tub or shower. Use a bathtub transfer bench or a shower chair with a back support Use an elevated toilet seat and/or safety rails to assist standing from a low surface. Do not use towel racks or bathroom tissue holders to help you stand.  Lighting - Make sure halls, stairways, and entrances are well-lit. Install a night light in your bathroom or hallway. Make sure there is a light switch at the top and bottom of the staircase. Turn lights on if you get up in the middle of the night. Make sure lamps or light switches are within reach of the bed if you have to get up during the night.  Kitchen - Install non-skid Animal nutritionist  mats near the sink and stove. Clean spills immediately. Store frequently used utensils, pots, pans between waist and eye level. This helps prevent reaching and bending. Sit when getting things out of lower cupboards.  Living room/ Bedrooms - Place furniture with wide spaces in between, giving enough room to move around. Establish a route through the living room that gives you something to  hold onto as you walk.  Stairs - Make sure treads, rails, and rugs are secure. Install a rail on both sides of the stairs. If stairs are a threat, it might be helpful to arrange most of your activities on the lower level to reduce the number of times you must climb the stairs.  Entrances and doorways - Install metal handles on the walls adjacent to the doorknobs of all doors to make it more secure as you travel through the doorway.  Tips for maintaining balance Keep at least one hand free at all times. Try using a backpack or fanny pack to hold things rather than carrying them in your hands. Never carry objects in both hands when walking as this interferes with keeping your balance.  Attempt to swing both arms from front to back while walking. This might require a conscious effort if Parkinson's disease has diminished your movement. It will, however, help you to maintain balance and posture, and reduce fatigue.  Consciously lift your feet off of the ground when walking. Shuffling and dragging of the feet is a common culprit in losing your balance.  When trying to navigate turns, use a U technique of facing forward and making a wide turn, rather than pivoting sharply.  Try to stand with your feet shoulder-length apart. When your feet are close together for any length of time, you increase your risk of losing your balance and falling.  Do one thing at a time. Don't try to walk and accomplish another task, such as reading or looking around. The decrease in your automatic reflexes complicates motor function, so the less distraction, the better.  Do not wear rubber or gripping soled shoes, they might catch on the floor and cause tripping.  Move slowly when changing positions. Use deliberate, concentrated movements and, if needed, use a grab bar or walking aid. Count 15 seconds between each movement. For example, when rising from a seated position, wait 15 seconds after standing to begin  walking.  If balance is a continuous problem, you might want to consider a walking aid such as a cane, walking stick, or walker. Once you've mastered walking with help, you might be ready to try it on your own again.

## 2024-01-05 ENCOUNTER — Ambulatory Visit: Attending: Cardiology

## 2024-01-05 DIAGNOSIS — I480 Paroxysmal atrial fibrillation: Secondary | ICD-10-CM | POA: Diagnosis not present

## 2024-01-05 LAB — POCT INR: INR: 2.6 (ref 2.0–3.0)

## 2024-01-05 NOTE — Progress Notes (Signed)
 Description   INR 2.6: Continue taking warfarin 1/2 a tablet daily.  Recheck INR in 4 weeks.  Anticoagulation Clinic [phone]

## 2024-01-05 NOTE — Patient Instructions (Addendum)
 Description   INR 2.6: Continue taking warfarin 1/2 a tablet daily.  Recheck INR in 4 weeks.  Anticoagulation Clinic [phone]

## 2024-01-09 ENCOUNTER — Ambulatory Visit: Admitting: Internal Medicine

## 2024-01-21 ENCOUNTER — Other Ambulatory Visit: Payer: Self-pay

## 2024-01-21 ENCOUNTER — Emergency Department (HOSPITAL_COMMUNITY)
Admission: EM | Admit: 2024-01-21 | Discharge: 2024-01-21 | Disposition: A | Discharge status: Home / Self Care | Attending: Emergency Medicine | Admitting: Emergency Medicine

## 2024-01-21 ENCOUNTER — Encounter (HOSPITAL_COMMUNITY): Payer: Self-pay | Admitting: Emergency Medicine

## 2024-01-21 ENCOUNTER — Emergency Department (HOSPITAL_COMMUNITY)

## 2024-01-21 DIAGNOSIS — R600 Localized edema: Secondary | ICD-10-CM

## 2024-01-21 DIAGNOSIS — I110 Hypertensive heart disease with heart failure: Secondary | ICD-10-CM | POA: Diagnosis not present

## 2024-01-21 DIAGNOSIS — R2243 Localized swelling, mass and lump, lower limb, bilateral: Secondary | ICD-10-CM | POA: Diagnosis not present

## 2024-01-21 DIAGNOSIS — K7031 Alcoholic cirrhosis of liver with ascites: Secondary | ICD-10-CM | POA: Insufficient documentation

## 2024-01-21 DIAGNOSIS — I509 Heart failure, unspecified: Secondary | ICD-10-CM | POA: Diagnosis not present

## 2024-01-21 DIAGNOSIS — R0602 Shortness of breath: Secondary | ICD-10-CM | POA: Diagnosis present

## 2024-01-21 DIAGNOSIS — K7460 Unspecified cirrhosis of liver: Secondary | ICD-10-CM

## 2024-01-21 DIAGNOSIS — E1165 Type 2 diabetes mellitus with hyperglycemia: Secondary | ICD-10-CM | POA: Insufficient documentation

## 2024-01-21 DIAGNOSIS — Z7902 Long term (current) use of antithrombotics/antiplatelets: Secondary | ICD-10-CM | POA: Diagnosis not present

## 2024-01-21 DIAGNOSIS — Z91040 Latex allergy status: Secondary | ICD-10-CM | POA: Insufficient documentation

## 2024-01-21 DIAGNOSIS — Z7951 Long term (current) use of inhaled steroids: Secondary | ICD-10-CM | POA: Diagnosis not present

## 2024-01-21 DIAGNOSIS — J441 Chronic obstructive pulmonary disease with (acute) exacerbation: Secondary | ICD-10-CM | POA: Insufficient documentation

## 2024-01-21 DIAGNOSIS — Z794 Long term (current) use of insulin: Secondary | ICD-10-CM | POA: Diagnosis not present

## 2024-01-21 LAB — BASIC METABOLIC PANEL WITH GFR
Anion gap: 15 (ref 5–15)
BUN: 23 mg/dL (ref 8–23)
CO2: 27 mmol/L (ref 22–32)
Calcium: 8.5 mg/dL — ABNORMAL LOW (ref 8.9–10.3)
Chloride: 97 mmol/L — ABNORMAL LOW (ref 98–111)
Creatinine, Ser: 2 mg/dL — ABNORMAL HIGH (ref 0.61–1.24)
GFR, Estimated: 36 mL/min — ABNORMAL LOW (ref >60)
Glucose, Bld: 272 mg/dL — ABNORMAL HIGH (ref 70–99)
Potassium: 3.5 mmol/L (ref 3.5–5.1)
Sodium: 139 mmol/L (ref 135–145)

## 2024-01-21 LAB — I-STAT CHEM 8, ED
BUN: 25 mg/dL — ABNORMAL HIGH (ref 8–23)
Calcium, Ion: 1.01 mmol/L — ABNORMAL LOW (ref 1.15–1.40)
Chloride: 99 mmol/L (ref 98–111)
Creatinine, Ser: 2 mg/dL — ABNORMAL HIGH (ref 0.61–1.24)
Glucose, Bld: 272 mg/dL — ABNORMAL HIGH (ref 70–99)
HCT: 33 % — ABNORMAL LOW (ref 39.0–52.0)
Hemoglobin: 11.2 g/dL — ABNORMAL LOW (ref 13.0–17.0)
Potassium: 3.5 mmol/L (ref 3.5–5.1)
Sodium: 140 mmol/L (ref 135–145)
TCO2: 28 mmol/L (ref 22–32)

## 2024-01-21 LAB — CBC
HCT: 33.2 % — ABNORMAL LOW (ref 39.0–52.0)
Hemoglobin: 10.3 g/dL — ABNORMAL LOW (ref 13.0–17.0)
MCH: 28.9 pg (ref 26.0–34.0)
MCHC: 31 g/dL (ref 30.0–36.0)
MCV: 93.3 fL (ref 80.0–100.0)
Platelets: 78 K/uL — ABNORMAL LOW (ref 150–400)
RBC: 3.56 MIL/uL — ABNORMAL LOW (ref 4.22–5.81)
RDW: 17.3 % — ABNORMAL HIGH (ref 11.5–15.5)
WBC: 4.3 K/uL (ref 4.0–10.5)
nRBC: 0 % (ref 0.0–0.2)

## 2024-01-21 LAB — BRAIN NATRIURETIC PEPTIDE: B Natriuretic Peptide: 194.3 pg/mL — ABNORMAL HIGH (ref 0.0–100.0)

## 2024-01-21 LAB — TROPONIN I (HIGH SENSITIVITY): Troponin I (High Sensitivity): 14 ng/L (ref <18)

## 2024-01-21 MED ORDER — DOXYCYCLINE HYCLATE 100 MG PO CAPS: 100.0000 mg | ORAL_CAPSULE | Freq: Two times a day (BID) | ORAL | 0 refills | Status: AC

## 2024-01-21 MED ORDER — FUROSEMIDE 10 MG/ML IJ SOLN
40.0000 mg | Freq: Once | INTRAMUSCULAR | Status: DC
  Filled 2024-01-21: qty 4

## 2024-01-21 MED ORDER — IPRATROPIUM-ALBUTEROL 0.5-2.5 (3) MG/3ML IN SOLN
3.0000 mL | Freq: Once | RESPIRATORY_TRACT | Status: DC
  Filled 2024-01-21: qty 3

## 2024-01-21 NOTE — Discharge Instructions (Addendum)
 Your symptoms are consistent with a mild COPD exacerbation.  You are offered an albuterol  nebulizer and steroid in the emergency department and declined.  You have a home albuterol  nebulizer, recommend that you resume compliance with albuterol  nebulizer therapy for symptomatic management of your COPD.  We will discharge you on a course of doxycycline  in the setting of COPD exacerbation.  Regarding your lower extremity swelling, this is likely due to a combination of your leg edema and cirrhosis.  Continue with your home diuretic regimen and follow-up within the next week for recheck of your renal function as your kidney function has decreased somewhat compared to your baseline.  Follow-up for recheck of your INR, your most recent INR 2 weeks ago was therapeutic.  Continue your home warfarin therapy.  Your cardiac troponin today was normal.  Your lower extremities did not show signs of superimposed cellulitis today.

## 2024-01-21 NOTE — ED Triage Notes (Signed)
 Pt here for SOB, CP and fluid retention x 1 month. Came in today because he has leaking fluid from his leg and from under his finger nails. PT is on some kind of fluid pill.

## 2024-01-21 NOTE — ED Provider Notes (Addendum)
 [facility] Provider Note   CSN: [id number] Arrival date & time: 01/21/24  9090     Patient presents with: Chest Pain and Shortness of Breath   [name] is a 68 y.o. male.    Chest Pain Associated symptoms: cough and shortness of breath   Shortness of Breath Associated symptoms: chest pain and cough      67 year old male with medical history significant for obesity, HLD, HTN, COPD, memory loss, GERD, diabetes mellitus, atrial flutter status post ablation, OSA, brain aneurysm, CHF with diastolic dysfunction, liver cirrhosis who presents to the emergency department with lower extremity swelling in addition to shortness of breath.  The patient states that his symptoms have been present for the past year, a long time however he noticed worsening lower extremity swelling over the past week and had after some weeping from his lower extremities.  He is on outpatient torsemide  and metolazone .  He has a history of COPD and has not been doing home nebulizer treatments because he was worried about a steroid potentially increasing his blood glucose.  He has been taking his home Trelegy however and that has been helping his symptoms somewhat.  He endorses chronic abdominal distention, denies any abdominal pain, denies any fevers or chills.  He does endorse worsening lower extremity swelling which has not been bothering him despite diuretic use.  He denies any chest pain, endorses chest tightness.  Cough productive sputum noted.  Prior to Admission medications   Medication Sig Start Date End Date Taking? Authorizing Provider  doxycycline  (VIBRAMYCIN ) 100 MG capsule Take 1 capsule (100 mg total) by mouth 2 (two) times daily for 5 days. 01/21/24 01/26/24 Yes Jerrol Agent, MD  albuterol  (PROVENTIL ) (2.5 MG/3ML) 0.083% nebulizer solution Take 3 mLs (2.5 mg total) by nebulization every 6 (six) hours as needed for wheezing or shortness of breath. 06/29/23  06/28/24  Parrett, Madelin RAMAN, NP  albuterol  (VENTOLIN  HFA) 108 (90 Base) MCG/ACT inhaler INHALE 1-2 PUFFS INTO THE LUNGS EVERY 6 HOURS AS NEEDED FOR WHEEZING OR SHORTNESS OF BREATH 11/03/22   Parrett, Madelin RAMAN, NP  b complex vitamins tablet Take 1 tablet by mouth every Monday, Wednesday, and Friday.    [provider]  bisoprolol  (ZEBETA ) 10 MG tablet Take 1 tablet (10 mg total) by mouth daily. 08/09/22   Boscia, [name] E, NP  clobetasol  cream (TEMOVATE ) 0.05 % Apply 1 Application topically 2 (two) times daily as needed (Dermatitis). 08/21/23   Hongalgi, Anand D, MD  clotrimazole -betamethasone  (LOTRISONE ) cream Apply 1 Application topically daily as needed (Fungus). 08/21/23   Hongalgi, Anand D, MD  colchicine  0.6 MG tablet Take 1 tablet (0.6 mg total) by mouth daily. 08/21/23   Hongalgi, Anand D, MD  Continuous Glucose Sensor (DEXCOM G7 SENSOR) MISC APPLY 1 SENSOR EVERY 10 DAYS 11/03/23   Trixie File, MD  famotidine  (PEPCID ) 20 MG tablet One after supper 08/04/22   Darlean Ozell NOVAK, MD  febuxostat  (ULORIC ) 40 MG tablet TAKE 1 TABLET BY MOUTH DAILY Patient not taking: Reported on 12/29/2023 12/09/22   Chandra Toribio POUR, MD  Fluticasone -Umeclidin-Vilant (TRELEGY ELLIPTA ) 100-62.5-25 MCG/ACT AEPB INHALE 1 PUFF BY MOUTH DAILY 07/17/23   Darlean Ozell NOVAK, MD  gabapentin  (NEURONTIN ) 300 MG capsule Take 2 capsules (600 mg total) by mouth 2 (two) times daily. 12/29/23   Leigh Venetia CROME, MD  Glucagon  3 MG/DOSE POWD Place 3 mg into the nose once as needed for up to 1 dose. 01/12/22  Trixie File, MD  Insulin  Disposable Pump (V-GO 20) 20 UNIT/24HR KIT 1 each by Does not apply route daily. 09/25/23   Trixie File, MD  insulin  glargine (LANTUS  SOLOSTAR) 100 UNIT/ML Solostar Pen Inject 75 Units into the skin daily. 06/16/23   Trixie File, MD  insulin  regular human CONCENTRATED (HUMULIN  R U-500 KWIKPEN) 500 UNIT/ML KwikPen Use up to 250-260 units of insulin  daily as advised 07/25/23   Trixie File, MD   insulin  regular human CONCENTRATED (HUMULIN  R) 500 UNIT/ML injection Use 280 units of insulin  a day in the VGo pump. 09/25/23   Trixie File, MD  ketoconazole  (NIZORAL ) 2 % cream Apply 1 Application topically daily. 01/31/22   Boscia, [name] E, NP  lactulose , encephalopathy, (ENULOSE ) 10 GM/15ML SOLN TAKE 15 MLS BY MOUTH 3 TIMES DAILY AS DIRECTED 09/05/23   Dorsey, Ying C, MD  Meclizine HCl 25 MG CHEW Chew 1 tablet by mouth 3 (three) times daily as needed (Dizziness). 06/26/23   [provider]  metolazone  (ZAROXOLYN ) 2.5 MG tablet Take 1 tablet (2.5 mg total) by mouth as needed. Patient not taking: Reported on 12/29/2023 11/24/23   Lavona Agent, MD  nicotine  (NICODERM CQ  - DOSED IN MG/24 HOURS) 21 mg/24hr patch Place 1 patch (21 mg total) onto the skin daily. 08/22/23   Hongalgi, Anand D, MD  nitroGLYCERIN  (NITROSTAT ) 0.4 MG SL tablet DISSOLVE 1 TABLET UNDER THE TONGUE EVERY 5 MINUTES AS NEEDED FOR CHEST PAIN. DO NOT EXCEED A TOTAL OF 3 DOSES IN 15 MINUTES. Patient not taking: Reported on 12/29/2023 10/10/23   Meng, [name], PA  oxyCODONE -acetaminophen  (PERCOCET) 10-325 MG tablet Take 1 tablet by mouth every 4 (four) hours. 08/21/23   Hongalgi, Anand D, MD  potassium chloride  SA (KLOR-CON  M) 20 MEQ tablet Take 1 tablet (20 mEq total) by mouth daily. TAKE AN EXTRA TABLET ON TUESDAY AND THURSDAY 08/04/23   Lavona Agent, MD  predniSONE  (DELTASONE ) 20 MG tablet Take number 2 pills (40 mg) once daily by mouth for 3 days.  Then take number 1 pill (20 mg) once daily by mouth for 3 days. Patient not taking: Reported on 12/29/2023 10/05/23   Ival Domino, FNP  primidone  (MYSOLINE ) 50 MG tablet Take 0.5 tablets (25 mg total) by mouth in the morning and at bedtime. 12/29/23   Leigh Venetia CROME, MD  rosuvastatin  (CRESTOR ) 20 MG tablet Take 20 mg by mouth at bedtime. 06/26/23   [provider]  spironolactone  (ALDACTONE ) 25 MG tablet Take 0.5 tablets (12.5 mg total) by mouth daily. 08/29/23 12/29/23  Meng,  [name], PA  tacrolimus  (PROTOPIC ) 0.1 % ointment Apply 1 Application topically 2 (two) times daily as needed (Dermatitis). 08/21/23   Hongalgi, Anand D, MD  tamsulosin  (FLOMAX ) 0.4 MG CAPS capsule TAKE 1 CAPSULE BY MOUTH EACH NIGHT AT BEDTIME 07/15/21   McKenzie, Belvie CROME, MD  torsemide  (DEMADEX ) 20 MG tablet TAKE 2 TABELTS BY MOUTH DAILY IN THE MORNING AND 1 IN THE EVENING 04/24/23   Lavona Agent, MD  triamcinolone  cream (KENALOG ) 0.1 % Apply 1 Application topically daily as needed. 11/30/21   Boscia, [name] E, NP  VITAMIN A PO Take 2,400 mcg by mouth daily.     [provider]  Vitamin D , Ergocalciferol , (DRISDOL ) 1.25 MG (50000 UNIT) CAPS capsule Take 1 capsule (50,000 Units total) by mouth every 7 (seven) days. Patient not taking: Reported on 12/29/2023 11/01/22   Boscia, [name] E, NP  warfarin (COUMADIN ) 5 MG tablet Take 1/2 to 1 tablet daily or as directed  05/25/23   Lavona Agent, MD    Allergies: Adhesive [tape] and Latex    Review of Systems  Respiratory:  Positive for cough and shortness of breath.   Cardiovascular:  Positive for chest pain and leg swelling.  All other systems reviewed and are negative.   Updated Vital Signs BP 108/61 (BP Location: Right Arm)   Pulse 62   Temp 97.6 F (36.4 C) (Oral)   Resp 16   SpO2 100%   Physical Exam Vitals and nursing note reviewed.  Constitutional:      General: He is not in acute distress.    Appearance: He is well-developed.  HENT:     Head: Normocephalic and atraumatic.  Eyes:     Conjunctiva/sclera: Conjunctivae normal.  Cardiovascular:     Rate and Rhythm: Normal rate and regular rhythm.     Heart sounds: No murmur heard. Pulmonary:     Effort: Pulmonary effort is normal. No respiratory distress.     Breath sounds: Examination of the right-upper field reveals wheezing. Examination of the left-upper field reveals wheezing. Examination of the right-middle field reveals wheezing. Examination of the left-middle field  reveals wheezing. Examination of the right-lower field reveals wheezing. Examination of the left-lower field reveals wheezing. Wheezing present.     Comments: Expiratory wheezing all lung fields Abdominal:     Palpations: Abdomen is soft.     Tenderness: There is no abdominal tenderness.  Musculoskeletal:        General: No swelling.     Cervical back: Neck supple.     Right lower leg: Edema present.     Left lower leg: Edema present.     Comments: 1+ bilateral lower extremity edema, legs are cool to the touch  Skin:    General: Skin is warm and dry.     Capillary Refill: Capillary refill takes less than 2 seconds.  Neurological:     Mental Status: He is alert.  Psychiatric:        Mood and Affect: Mood normal.     (all labs ordered are listed, but only abnormal results are displayed) Labs Reviewed  BASIC METABOLIC PANEL WITH GFR - Abnormal; Notable for the following components:      Result Value   Chloride 97 (*)    Glucose, Bld 272 (*)    Creatinine, Ser 2.00 (*)    Calcium  8.5 (*)    GFR, Estimated 36 (*)    All other components within normal limits  CBC - Abnormal; Notable for the following components:   RBC 3.56 (*)    Hemoglobin 10.3 (*)    HCT 33.2 (*)    RDW 17.3 (*)    Platelets 78 (*)    All other components within normal limits  BRAIN NATRIURETIC PEPTIDE - Abnormal; Notable for the following components:   B Natriuretic Peptide 194.3 (*)    All other components within normal limits  I-STAT CHEM 8, ED - Abnormal; Notable for the following components:   BUN 25 (*)    Creatinine, Ser 2.00 (*)    Glucose, Bld 272 (*)    Calcium , Ion 1.01 (*)    Hemoglobin 11.2 (*)    HCT 33.0 (*)    All other components within normal limits  TROPONIN I (HIGH SENSITIVITY)    EKG: EKG Interpretation Date/Time:  Sunday January 21 2024 10:14:46 EDT Ventricular Rate:  63 PR Interval:  182 QRS Duration:  92 QT Interval:  415 QTC Calculation: 422 R Axis:  26  Text  Interpretation: Sinus rhythm Nonspecific T wave abnormality No significant change since last tracing Confirmed by Jerrol Agent ([id number]) on 01/21/2024 8:32:41 PM  Radiology: DG Chest 2 View Result Date: 01/21/2024 CLINICAL DATA:  Chest pain and shortness of breath EXAM: CHEST - 2 VIEW COMPARISON:  Chest radiograph dated 12/27/2023 FINDINGS: Patient is rotated slightly to the right. Normal lung volumes. Linear opacity projecting over the right middle lobe/lingula. No pleural effusion or pneumothorax. Similar mildly enlarged cardiomediastinal silhouette. No acute osseous abnormality. IMPRESSION: 1. Linear opacity projecting over the right middle lobe/lingula, likely atelectasis. 2. Similar mildly enlarged cardiomediastinal silhouette. Electronically Signed   By: Limin  Xu M.D.   On: 01/21/2024 09:56     Procedures   Medications Ordered in the ED - No data to display                                  Medical Decision Making Amount and/or Complexity of Data Reviewed Labs: ordered. Radiology: ordered.  Risk Prescription drug management.    68 year old male with medical history significant for obesity, HLD, HTN, COPD, memory loss, GERD, diabetes mellitus, atrial flutter status post ablation, OSA, brain aneurysm, CHF with diastolic dysfunction, liver cirrhosis who presents to the emergency department with lower extremity swelling in addition to shortness of breath.  The patient states that his symptoms have been present for the past year, a long time however he noticed worsening lower extremity swelling over the past week and had after some weeping from his lower extremities.  He is on outpatient torsemide  and metolazone .  He has a history of COPD and has not been doing home nebulizer treatments because he was worried about a steroid potentially increasing his blood glucose.  He has been taking his home Trelegy however and that has been helping his symptoms somewhat.  He endorses chronic abdominal  distention, denies any abdominal pain, denies any fevers or chills.  He does endorse worsening lower extremity swelling which has not been bothering him despite diuretic use.  He denies any chest pain, endorses chest tightness.  Cough productive sputum noted.  On arrival, the patient was afebrile, not tachycardic, on my evaluation not tachypneic, resting comfortably, saturating well on room air, hemodynamically stable.  Presenting with weeping lower extremity edema.  Denies any orthopnea.  On exam the patient had wheezing in all lung fields with lower extremity edema, no evidence of cellulitis noted.  Patient presenting with cough productive of sputum, chest tightness.  Considered viral infection, pneumonia, COPD exacerbation considered most likely DVT or PE as the patient is currently anticoagulated, most recent INR therapeutic.  EKG revealed sinus rhythm, rate 63, nonspecific T wave abnormality, no significant changes from previous tracings.  Chest x-ray: Linear opacity projecting over the right middle lobe/lingula, likely atelectasis, similarly mildly enlarged cardiomediastinal silhouette.  Labs: BNP nonspecifically mildly elevated at 194, cardiac troponin 14, CBC without a leukocytosis, stable anemia to 10.3, BMP with hyperglycemia to 272, creatinine 2.0, no anion gap acidosis with a bicarbonate of 27, anion gap 15, creatinine appears to range from 1.5-2.  Patient with chest tightness that is intermittent and chronic, no acute changes noted on EKG and initial cardiac troponin normal, do not think repeat cardiac troponin testing is indicated at this time.  Suspect patient presentation due to COPD with wheezing on exam as well as patient's known cirrhosis/ascites and lower extremity edema for which he is already on an  outpatient diuretic.  Abdomen is soft nontender and the patient has no encephalopathy, at his baseline mental status, denies any fevers or chills, low concern for SBP.  Offered the  patient a breathing treatment via albuterol  nebulizer as well as dose of his diuretic IV.  Chest x-ray shows no evidence of pulmonary edema, the patient is in no respiratory distress and is saturating well on room air.  The patient declined management stating that he would prefer to take these medications at home.  I see no other indication of hypoxic respiratory failure, pulmonary edema or need for inpatient admission for observation.  The patient was in no respiratory distress, suspect mild COPD exacerbation.  Patient declined steroid management citing concern for hyperglycemia.  Will plan for resumption of his home albuterol  nebulizer as he had been noncompliant with this previously, oral doxycycline , steroid deferred for the patient.  No evidence of cellulitis of the lower extremities, extremities are cool to the touch.  Patient has chronic lower extremity edema which is mildly worsened.  Patient overall stable for discharge and treatment of likely COPD exacerbation, advised continued outpatient diuresis, advised continued close management and follow-up for recheck of his renal function within the next week.  Patient declining any further diagnostic workup or treatment in the emergency department, plans to follow-up outpatient.      Final diagnoses:  COPD exacerbation (HCC)  Bilateral leg edema  Cirrhosis of liver with ascites, unspecified hepatic cirrhosis type [facility])    ED Discharge Orders          Ordered    doxycycline  (VIBRAMYCIN ) 100 MG capsule  2 times daily        01/21/24 1112               Jerrol Agent, MD 01/21/24 2036    Jerrol Agent, MD 01/21/24 2038

## 2024-01-23 ENCOUNTER — Other Ambulatory Visit: Payer: Self-pay | Admitting: Internal Medicine

## 2024-02-02 ENCOUNTER — Ambulatory Visit

## 2024-02-08 ENCOUNTER — Other Ambulatory Visit: Payer: Self-pay | Admitting: Internal Medicine

## 2024-02-08 DIAGNOSIS — J449 Chronic obstructive pulmonary disease, unspecified: Secondary | ICD-10-CM

## 2024-02-12 ENCOUNTER — Encounter

## 2024-02-13 ENCOUNTER — Other Ambulatory Visit: Payer: Self-pay | Admitting: Cardiology

## 2024-02-13 DIAGNOSIS — I480 Paroxysmal atrial fibrillation: Secondary | ICD-10-CM

## 2024-02-14 ENCOUNTER — Ambulatory Visit: Admitting: Internal Medicine

## 2024-03-14 ENCOUNTER — Other Ambulatory Visit: Payer: Self-pay | Admitting: Internal Medicine

## 2024-03-14 DIAGNOSIS — J449 Chronic obstructive pulmonary disease, unspecified: Secondary | ICD-10-CM

## 2024-03-15 ENCOUNTER — Ambulatory Visit: Attending: Cardiology

## 2024-03-15 DIAGNOSIS — Z5181 Encounter for therapeutic drug level monitoring: Secondary | ICD-10-CM | POA: Diagnosis not present

## 2024-03-15 DIAGNOSIS — I480 Paroxysmal atrial fibrillation: Secondary | ICD-10-CM

## 2024-03-15 LAB — POCT INR: INR: 3.2 — AB (ref 2.0–3.0)

## 2024-03-15 NOTE — Patient Instructions (Signed)
 Hold tonight only then Continue taking warfarin 1/2 a tablet daily.  Recheck INR in 6 weeks.  Anticoagulation Clinic [phone]

## 2024-03-15 NOTE — Progress Notes (Signed)
 INR 3.2 Please see anticoagulation encounter Hold tonight only then Continue taking warfarin 1/2 a tablet daily.  Recheck INR in 6 weeks.  Anticoagulation Clinic [phone]

## 2024-03-18 ENCOUNTER — Encounter: Payer: Self-pay | Admitting: Radiology

## 2024-04-01 ENCOUNTER — Other Ambulatory Visit: Payer: Self-pay | Admitting: Cardiology

## 2024-04-01 DIAGNOSIS — I480 Paroxysmal atrial fibrillation: Secondary | ICD-10-CM

## 2024-04-26 ENCOUNTER — Ambulatory Visit

## 2024-05-06 ENCOUNTER — Other Ambulatory Visit: Payer: Self-pay | Admitting: Cardiology

## 2024-05-31 ENCOUNTER — Other Ambulatory Visit: Payer: Self-pay | Admitting: Internal Medicine

## 2024-07-05 ENCOUNTER — Ambulatory Visit: Admitting: Neurology
